# Patient Record
Sex: Male | Born: 1960 | Race: White | Hispanic: No | Marital: Married | Smoking: Current every day smoker
Health system: Southern US, Community
[De-identification: ages and names within clinical notes are randomized; demographics above are authoritative.]

## PROBLEM LIST (undated history)

## (undated) ENCOUNTER — Emergency Department (HOSPITAL_COMMUNITY): Admission: EM | Payer: Medicare Other | Source: Home / Self Care

## (undated) DIAGNOSIS — I70219 Atherosclerosis of native arteries of extremities with intermittent claudication, unspecified extremity: Secondary | ICD-10-CM

## (undated) DIAGNOSIS — J449 Chronic obstructive pulmonary disease, unspecified: Secondary | ICD-10-CM

## (undated) DIAGNOSIS — Z95828 Presence of other vascular implants and grafts: Secondary | ICD-10-CM

## (undated) DIAGNOSIS — Z5111 Encounter for antineoplastic chemotherapy: Secondary | ICD-10-CM

## (undated) DIAGNOSIS — Z95810 Presence of automatic (implantable) cardiac defibrillator: Secondary | ICD-10-CM

## (undated) DIAGNOSIS — C3491 Malignant neoplasm of unspecified part of right bronchus or lung: Secondary | ICD-10-CM

## (undated) DIAGNOSIS — E7800 Pure hypercholesterolemia, unspecified: Secondary | ICD-10-CM

## (undated) DIAGNOSIS — Z720 Tobacco use: Secondary | ICD-10-CM

## (undated) DIAGNOSIS — N3943 Post-void dribbling: Secondary | ICD-10-CM

## (undated) DIAGNOSIS — I739 Peripheral vascular disease, unspecified: Secondary | ICD-10-CM

## (undated) DIAGNOSIS — I255 Ischemic cardiomyopathy: Secondary | ICD-10-CM

## (undated) DIAGNOSIS — D126 Benign neoplasm of colon, unspecified: Secondary | ICD-10-CM

## (undated) DIAGNOSIS — F32A Depression, unspecified: Secondary | ICD-10-CM

## (undated) DIAGNOSIS — F329 Major depressive disorder, single episode, unspecified: Secondary | ICD-10-CM

## (undated) DIAGNOSIS — B182 Chronic viral hepatitis C: Secondary | ICD-10-CM

## (undated) DIAGNOSIS — I2129 ST elevation (STEMI) myocardial infarction involving other sites: Secondary | ICD-10-CM

## (undated) DIAGNOSIS — B1710 Acute hepatitis C without hepatic coma: Secondary | ICD-10-CM

## (undated) DIAGNOSIS — F419 Anxiety disorder, unspecified: Secondary | ICD-10-CM

## (undated) DIAGNOSIS — B1920 Unspecified viral hepatitis C without hepatic coma: Secondary | ICD-10-CM

## (undated) DIAGNOSIS — I2102 ST elevation (STEMI) myocardial infarction involving left anterior descending coronary artery: Secondary | ICD-10-CM

## (undated) DIAGNOSIS — I213 ST elevation (STEMI) myocardial infarction of unspecified site: Secondary | ICD-10-CM

## (undated) DIAGNOSIS — I82409 Acute embolism and thrombosis of unspecified deep veins of unspecified lower extremity: Secondary | ICD-10-CM

## (undated) HISTORY — PX: TYMPANOSTOMY TUBE PLACEMENT: SHX32

## (undated) HISTORY — DX: Peripheral vascular disease, unspecified: I73.9

## (undated) HISTORY — DX: Malignant neoplasm of unspecified part of right bronchus or lung: C34.91

## (undated) HISTORY — PX: LAPAROSCOPIC CHOLECYSTECTOMY: SUR755

## (undated) HISTORY — DX: Unspecified viral hepatitis C without hepatic coma: B19.20

## (undated) HISTORY — PX: HERNIA REPAIR: SHX51

## (undated) HISTORY — DX: ST elevation (STEMI) myocardial infarction involving left anterior descending coronary artery: I21.02

## (undated) HISTORY — DX: Ischemic cardiomyopathy: I25.5

## (undated) HISTORY — PX: FINGER SURGERY: SHX640

## (undated) HISTORY — DX: Chronic obstructive pulmonary disease, unspecified: J44.9

## (undated) HISTORY — DX: Acute embolism and thrombosis of unspecified deep veins of unspecified lower extremity: I82.409

## (undated) HISTORY — DX: ST elevation (STEMI) myocardial infarction involving other sites: I21.29

## (undated) HISTORY — DX: Benign neoplasm of colon, unspecified: D12.6

## (undated) HISTORY — DX: Depression, unspecified: F32.A

## (undated) HISTORY — DX: Encounter for antineoplastic chemotherapy: Z51.11

## (undated) HISTORY — DX: Presence of other vascular implants and grafts: Z95.828

## (undated) HISTORY — PX: TONSILLECTOMY AND ADENOIDECTOMY: SUR1326

## (undated) HISTORY — DX: Atherosclerosis of native arteries of extremities with intermittent claudication, unspecified extremity: I70.219

## (undated) HISTORY — PX: BLADDER TUMOR EXCISION: SHX238

## (undated) HISTORY — DX: Anxiety disorder, unspecified: F41.9

## (undated) HISTORY — DX: Acute hepatitis C without hepatic coma: B17.10

## (undated) HISTORY — DX: Chronic viral hepatitis C: B18.2

## (undated) HISTORY — DX: Major depressive disorder, single episode, unspecified: F32.9

## (undated) HISTORY — DX: Tobacco use: Z72.0

---

## 2003-11-26 DIAGNOSIS — M79609 Pain in unspecified limb: Secondary | ICD-10-CM | POA: Insufficient documentation

## 2003-12-06 ENCOUNTER — Emergency Department (HOSPITAL_COMMUNITY): Admission: EM | Admit: 2003-12-06 | Discharge: 2003-12-06 | Payer: Self-pay | Admitting: Emergency Medicine

## 2003-12-16 ENCOUNTER — Emergency Department (HOSPITAL_COMMUNITY): Admission: EM | Admit: 2003-12-16 | Discharge: 2003-12-16 | Payer: Self-pay | Admitting: Emergency Medicine

## 2003-12-25 ENCOUNTER — Encounter (HOSPITAL_COMMUNITY): Admission: RE | Admit: 2003-12-25 | Discharge: 2004-01-24 | Payer: Self-pay | Admitting: Orthopaedic Surgery

## 2005-03-11 ENCOUNTER — Inpatient Hospital Stay (HOSPITAL_COMMUNITY): Admission: EM | Admit: 2005-03-11 | Discharge: 2005-03-12 | Payer: Self-pay

## 2007-01-04 ENCOUNTER — Encounter (INDEPENDENT_AMBULATORY_CARE_PROVIDER_SITE_OTHER): Payer: Self-pay | Admitting: Internal Medicine

## 2007-05-03 ENCOUNTER — Encounter (INDEPENDENT_AMBULATORY_CARE_PROVIDER_SITE_OTHER): Payer: Self-pay | Admitting: Internal Medicine

## 2007-05-30 ENCOUNTER — Ambulatory Visit: Payer: Self-pay | Admitting: Internal Medicine

## 2007-05-30 DIAGNOSIS — A630 Anogenital (venereal) warts: Secondary | ICD-10-CM | POA: Insufficient documentation

## 2007-05-30 DIAGNOSIS — F411 Generalized anxiety disorder: Secondary | ICD-10-CM | POA: Insufficient documentation

## 2007-05-30 LAB — CONVERTED CEMR LAB
ALT: 92 units/L — ABNORMAL HIGH (ref 0–53)
AST: 61 units/L — ABNORMAL HIGH (ref 0–37)
Alkaline Phosphatase: 81 units/L (ref 39–117)
Basophils Absolute: 0.1 10*3/uL (ref 0.0–0.1)
Basophils Relative: 1 % (ref 0–1)
CO2: 24 meq/L (ref 19–32)
Cholesterol: 146 mg/dL (ref 0–200)
Creatinine, Ser: 1.12 mg/dL (ref 0.40–1.50)
Eosinophils Absolute: 0.1 10*3/uL (ref 0.0–0.7)
Eosinophils Relative: 1 % (ref 0–5)
HCT: 55.3 % — ABNORMAL HIGH (ref 39.0–52.0)
Hemoglobin: 18.5 g/dL — ABNORMAL HIGH (ref 13.0–17.0)
LDL Cholesterol: 79 mg/dL (ref 0–99)
MCHC: 33.5 g/dL (ref 30.0–36.0)
MCV: 96.3 fL (ref 78.0–100.0)
Monocytes Absolute: 1 10*3/uL — ABNORMAL HIGH (ref 0.2–0.7)
Platelets: 249 10*3/uL (ref 150–400)
RDW: 14.1 % — ABNORMAL HIGH (ref 11.5–14.0)
Sodium: 138 meq/L (ref 135–145)
Total Bilirubin: 1 mg/dL (ref 0.3–1.2)
Total CHOL/HDL Ratio: 2.5
Total Protein: 8.3 g/dL (ref 6.0–8.3)
VLDL: 9 mg/dL (ref 0–40)

## 2007-05-31 ENCOUNTER — Ambulatory Visit: Payer: Self-pay | Admitting: *Deleted

## 2007-06-07 ENCOUNTER — Encounter (INDEPENDENT_AMBULATORY_CARE_PROVIDER_SITE_OTHER): Payer: Self-pay | Admitting: Internal Medicine

## 2007-06-07 ENCOUNTER — Ambulatory Visit (HOSPITAL_COMMUNITY): Admission: RE | Admit: 2007-06-07 | Discharge: 2007-06-07 | Payer: Self-pay | Admitting: Internal Medicine

## 2007-06-07 ENCOUNTER — Ambulatory Visit: Payer: Self-pay | Admitting: Vascular Surgery

## 2007-06-08 ENCOUNTER — Encounter (INDEPENDENT_AMBULATORY_CARE_PROVIDER_SITE_OTHER): Payer: Self-pay | Admitting: Internal Medicine

## 2007-06-09 ENCOUNTER — Telehealth (INDEPENDENT_AMBULATORY_CARE_PROVIDER_SITE_OTHER): Payer: Self-pay | Admitting: *Deleted

## 2007-06-13 ENCOUNTER — Ambulatory Visit: Payer: Self-pay | Admitting: Internal Medicine

## 2007-06-13 DIAGNOSIS — L821 Other seborrheic keratosis: Secondary | ICD-10-CM | POA: Insufficient documentation

## 2007-06-13 DIAGNOSIS — R799 Abnormal finding of blood chemistry, unspecified: Secondary | ICD-10-CM | POA: Insufficient documentation

## 2007-06-16 LAB — CONVERTED CEMR LAB
BUN: 9 mg/dL (ref 6–23)
Hep A IgM: NEGATIVE
Hep B C IgM: NEGATIVE
Hep B S Ab: NEGATIVE
Hepatitis B Surface Ag: NEGATIVE
Potassium: 4.6 meq/L (ref 3.5–5.3)

## 2007-06-19 ENCOUNTER — Ambulatory Visit: Payer: Self-pay | Admitting: Internal Medicine

## 2007-06-19 DIAGNOSIS — B1710 Acute hepatitis C without hepatic coma: Secondary | ICD-10-CM | POA: Insufficient documentation

## 2007-06-19 HISTORY — DX: Acute hepatitis C without hepatic coma: B17.10

## 2007-06-21 ENCOUNTER — Encounter (INDEPENDENT_AMBULATORY_CARE_PROVIDER_SITE_OTHER): Payer: Self-pay | Admitting: Internal Medicine

## 2007-06-21 ENCOUNTER — Telehealth (INDEPENDENT_AMBULATORY_CARE_PROVIDER_SITE_OTHER): Payer: Self-pay | Admitting: Internal Medicine

## 2007-06-21 ENCOUNTER — Ambulatory Visit: Payer: Self-pay | Admitting: Vascular Surgery

## 2007-07-04 ENCOUNTER — Ambulatory Visit: Payer: Self-pay | Admitting: Internal Medicine

## 2007-07-10 LAB — CONVERTED CEMR LAB

## 2007-09-12 ENCOUNTER — Ambulatory Visit: Payer: Self-pay | Admitting: Vascular Surgery

## 2007-09-15 ENCOUNTER — Telehealth (INDEPENDENT_AMBULATORY_CARE_PROVIDER_SITE_OTHER): Payer: Self-pay | Admitting: Internal Medicine

## 2007-09-26 ENCOUNTER — Telehealth (INDEPENDENT_AMBULATORY_CARE_PROVIDER_SITE_OTHER): Payer: Self-pay | Admitting: Internal Medicine

## 2007-09-26 ENCOUNTER — Encounter (INDEPENDENT_AMBULATORY_CARE_PROVIDER_SITE_OTHER): Payer: Self-pay | Admitting: Internal Medicine

## 2007-09-28 DIAGNOSIS — I82409 Acute embolism and thrombosis of unspecified deep veins of unspecified lower extremity: Secondary | ICD-10-CM

## 2007-09-28 HISTORY — PX: FEMORAL-TIBIAL BYPASS GRAFT: SHX938

## 2007-09-28 HISTORY — DX: Acute embolism and thrombosis of unspecified deep veins of unspecified lower extremity: I82.409

## 2007-10-11 ENCOUNTER — Ambulatory Visit: Payer: Self-pay | Admitting: Vascular Surgery

## 2007-10-11 ENCOUNTER — Encounter (INDEPENDENT_AMBULATORY_CARE_PROVIDER_SITE_OTHER): Payer: Self-pay | Admitting: Internal Medicine

## 2007-10-18 ENCOUNTER — Encounter: Admission: RE | Admit: 2007-10-18 | Discharge: 2007-10-18 | Payer: Self-pay | Admitting: Vascular Surgery

## 2007-10-18 ENCOUNTER — Ambulatory Visit: Payer: Self-pay | Admitting: Vascular Surgery

## 2007-10-20 ENCOUNTER — Ambulatory Visit (HOSPITAL_COMMUNITY): Admission: RE | Admit: 2007-10-20 | Discharge: 2007-10-20 | Payer: Self-pay | Admitting: Vascular Surgery

## 2007-10-20 ENCOUNTER — Ambulatory Visit: Payer: Self-pay | Admitting: Vascular Surgery

## 2007-10-23 ENCOUNTER — Telehealth (INDEPENDENT_AMBULATORY_CARE_PROVIDER_SITE_OTHER): Payer: Self-pay | Admitting: Internal Medicine

## 2007-10-25 ENCOUNTER — Telehealth (INDEPENDENT_AMBULATORY_CARE_PROVIDER_SITE_OTHER): Payer: Self-pay | Admitting: Internal Medicine

## 2007-11-22 ENCOUNTER — Telehealth (INDEPENDENT_AMBULATORY_CARE_PROVIDER_SITE_OTHER): Payer: Self-pay | Admitting: Internal Medicine

## 2007-11-23 ENCOUNTER — Inpatient Hospital Stay (HOSPITAL_COMMUNITY): Admission: RE | Admit: 2007-11-23 | Discharge: 2007-12-01 | Payer: Self-pay | Admitting: Vascular Surgery

## 2007-11-23 ENCOUNTER — Encounter: Payer: Self-pay | Admitting: Vascular Surgery

## 2007-11-23 ENCOUNTER — Ambulatory Visit: Payer: Self-pay | Admitting: Vascular Surgery

## 2007-11-24 ENCOUNTER — Encounter: Payer: Self-pay | Admitting: Vascular Surgery

## 2007-11-24 HISTORY — PX: FEMORAL-TIBIAL BYPASS GRAFT: SHX938

## 2007-12-06 ENCOUNTER — Ambulatory Visit: Payer: Self-pay | Admitting: Vascular Surgery

## 2007-12-20 ENCOUNTER — Ambulatory Visit: Payer: Self-pay | Admitting: Vascular Surgery

## 2007-12-25 ENCOUNTER — Telehealth (INDEPENDENT_AMBULATORY_CARE_PROVIDER_SITE_OTHER): Payer: Self-pay | Admitting: Internal Medicine

## 2008-01-23 ENCOUNTER — Telehealth (INDEPENDENT_AMBULATORY_CARE_PROVIDER_SITE_OTHER): Payer: Self-pay | Admitting: Internal Medicine

## 2008-01-24 ENCOUNTER — Ambulatory Visit: Payer: Self-pay | Admitting: Vascular Surgery

## 2008-01-26 ENCOUNTER — Telehealth (INDEPENDENT_AMBULATORY_CARE_PROVIDER_SITE_OTHER): Payer: Self-pay | Admitting: Internal Medicine

## 2008-02-14 ENCOUNTER — Telehealth (INDEPENDENT_AMBULATORY_CARE_PROVIDER_SITE_OTHER): Payer: Self-pay | Admitting: Internal Medicine

## 2008-02-15 ENCOUNTER — Inpatient Hospital Stay (HOSPITAL_COMMUNITY): Admission: EM | Admit: 2008-02-15 | Discharge: 2008-02-20 | Payer: Self-pay | Admitting: Emergency Medicine

## 2008-02-15 ENCOUNTER — Ambulatory Visit: Payer: Self-pay | Admitting: Vascular Surgery

## 2008-02-15 ENCOUNTER — Ambulatory Visit: Payer: Self-pay | Admitting: Internal Medicine

## 2008-02-16 ENCOUNTER — Encounter: Payer: Self-pay | Admitting: Vascular Surgery

## 2008-02-20 ENCOUNTER — Encounter (INDEPENDENT_AMBULATORY_CARE_PROVIDER_SITE_OTHER): Payer: Self-pay | Admitting: Internal Medicine

## 2008-02-23 ENCOUNTER — Telehealth (INDEPENDENT_AMBULATORY_CARE_PROVIDER_SITE_OTHER): Payer: Self-pay | Admitting: Internal Medicine

## 2008-02-23 ENCOUNTER — Ambulatory Visit: Payer: Self-pay | Admitting: Internal Medicine

## 2008-02-23 LAB — CONVERTED CEMR LAB: Prothrombin Time: 18 s — ABNORMAL HIGH (ref 10.2–14.0)

## 2008-02-27 ENCOUNTER — Encounter (INDEPENDENT_AMBULATORY_CARE_PROVIDER_SITE_OTHER): Payer: Self-pay | Admitting: Internal Medicine

## 2008-02-27 ENCOUNTER — Ambulatory Visit: Payer: Self-pay | Admitting: Vascular Surgery

## 2008-02-29 ENCOUNTER — Ambulatory Visit: Payer: Self-pay | Admitting: Internal Medicine

## 2008-03-05 LAB — CONVERTED CEMR LAB
INR: 2.2 — ABNORMAL HIGH (ref 0.0–1.5)
Prothrombin Time: 25 s — ABNORMAL HIGH (ref 11.6–15.2)

## 2008-03-08 ENCOUNTER — Ambulatory Visit: Payer: Self-pay | Admitting: Vascular Surgery

## 2008-03-08 ENCOUNTER — Emergency Department (HOSPITAL_COMMUNITY): Admission: EM | Admit: 2008-03-08 | Discharge: 2008-03-08 | Payer: Self-pay | Admitting: Emergency Medicine

## 2008-03-08 ENCOUNTER — Encounter (INDEPENDENT_AMBULATORY_CARE_PROVIDER_SITE_OTHER): Payer: Self-pay | Admitting: Emergency Medicine

## 2008-03-12 ENCOUNTER — Ambulatory Visit: Payer: Self-pay | Admitting: Vascular Surgery

## 2008-03-15 ENCOUNTER — Ambulatory Visit: Payer: Self-pay | Admitting: Internal Medicine

## 2008-03-19 LAB — CONVERTED CEMR LAB: Prothrombin Time: 28.2 s — ABNORMAL HIGH (ref 11.6–15.2)

## 2008-03-25 ENCOUNTER — Telehealth (INDEPENDENT_AMBULATORY_CARE_PROVIDER_SITE_OTHER): Payer: Self-pay | Admitting: Internal Medicine

## 2008-03-27 ENCOUNTER — Ambulatory Visit: Payer: Self-pay | Admitting: Vascular Surgery

## 2008-04-05 ENCOUNTER — Emergency Department (HOSPITAL_COMMUNITY): Admission: EM | Admit: 2008-04-05 | Discharge: 2008-04-06 | Payer: Self-pay | Admitting: Emergency Medicine

## 2008-04-24 ENCOUNTER — Telehealth (INDEPENDENT_AMBULATORY_CARE_PROVIDER_SITE_OTHER): Payer: Self-pay | Admitting: Internal Medicine

## 2008-04-25 ENCOUNTER — Ambulatory Visit: Payer: Self-pay | Admitting: Vascular Surgery

## 2008-04-25 ENCOUNTER — Encounter: Payer: Self-pay | Admitting: Vascular Surgery

## 2008-04-25 ENCOUNTER — Inpatient Hospital Stay (HOSPITAL_COMMUNITY): Admission: RE | Admit: 2008-04-25 | Discharge: 2008-04-29 | Payer: Self-pay | Admitting: Vascular Surgery

## 2008-04-25 HISTORY — PX: BELOW KNEE LEG AMPUTATION: SUR23

## 2008-04-26 ENCOUNTER — Ambulatory Visit: Payer: Self-pay | Admitting: Physical Medicine & Rehabilitation

## 2008-05-23 ENCOUNTER — Ambulatory Visit: Payer: Self-pay | Admitting: Vascular Surgery

## 2008-05-27 ENCOUNTER — Telehealth (INDEPENDENT_AMBULATORY_CARE_PROVIDER_SITE_OTHER): Payer: Self-pay | Admitting: Internal Medicine

## 2008-06-14 ENCOUNTER — Encounter (INDEPENDENT_AMBULATORY_CARE_PROVIDER_SITE_OTHER): Payer: Self-pay | Admitting: Internal Medicine

## 2008-06-18 ENCOUNTER — Encounter (INDEPENDENT_AMBULATORY_CARE_PROVIDER_SITE_OTHER): Payer: Self-pay | Admitting: Internal Medicine

## 2008-08-21 ENCOUNTER — Ambulatory Visit: Payer: Self-pay | Admitting: Vascular Surgery

## 2008-10-22 ENCOUNTER — Encounter (INDEPENDENT_AMBULATORY_CARE_PROVIDER_SITE_OTHER): Payer: Self-pay | Admitting: Internal Medicine

## 2008-11-06 ENCOUNTER — Ambulatory Visit: Payer: Self-pay | Admitting: Vascular Surgery

## 2008-11-20 ENCOUNTER — Ambulatory Visit: Payer: Self-pay | Admitting: Vascular Surgery

## 2009-01-01 ENCOUNTER — Encounter: Admission: RE | Admit: 2009-01-01 | Discharge: 2009-04-01 | Payer: Self-pay | Admitting: Vascular Surgery

## 2009-01-01 ENCOUNTER — Ambulatory Visit: Payer: Self-pay | Admitting: Vascular Surgery

## 2009-04-08 ENCOUNTER — Encounter: Admission: RE | Admit: 2009-04-08 | Discharge: 2009-06-04 | Payer: Self-pay | Admitting: Vascular Surgery

## 2009-07-23 ENCOUNTER — Encounter (INDEPENDENT_AMBULATORY_CARE_PROVIDER_SITE_OTHER): Payer: Self-pay | Admitting: Internal Medicine

## 2009-07-23 ENCOUNTER — Ambulatory Visit: Payer: Self-pay | Admitting: Vascular Surgery

## 2009-07-23 ENCOUNTER — Ambulatory Visit (HOSPITAL_COMMUNITY): Admission: RE | Admit: 2009-07-23 | Discharge: 2009-07-23 | Payer: Self-pay | Admitting: Internal Medicine

## 2009-07-30 ENCOUNTER — Ambulatory Visit: Payer: Self-pay | Admitting: Vascular Surgery

## 2009-08-06 ENCOUNTER — Encounter: Admission: RE | Admit: 2009-08-06 | Discharge: 2009-08-06 | Payer: Self-pay | Admitting: Vascular Surgery

## 2009-08-06 ENCOUNTER — Ambulatory Visit: Payer: Self-pay | Admitting: Vascular Surgery

## 2009-08-08 ENCOUNTER — Ambulatory Visit (HOSPITAL_COMMUNITY): Admission: RE | Admit: 2009-08-08 | Discharge: 2009-08-08 | Payer: Self-pay | Admitting: Vascular Surgery

## 2009-08-08 HISTORY — PX: OTHER SURGICAL HISTORY: SHX169

## 2009-09-10 ENCOUNTER — Ambulatory Visit: Payer: Self-pay | Admitting: Vascular Surgery

## 2009-11-11 ENCOUNTER — Ambulatory Visit (HOSPITAL_COMMUNITY): Admission: RE | Admit: 2009-11-11 | Discharge: 2009-11-11 | Payer: Self-pay | Admitting: Internal Medicine

## 2009-11-13 ENCOUNTER — Ambulatory Visit (HOSPITAL_COMMUNITY): Admission: RE | Admit: 2009-11-13 | Discharge: 2009-11-13 | Payer: Self-pay | Admitting: Internal Medicine

## 2009-12-10 ENCOUNTER — Ambulatory Visit: Payer: Self-pay | Admitting: Vascular Surgery

## 2009-12-22 ENCOUNTER — Ambulatory Visit (HOSPITAL_COMMUNITY): Admission: RE | Admit: 2009-12-22 | Discharge: 2009-12-22 | Payer: Self-pay | Admitting: General Surgery

## 2010-03-04 ENCOUNTER — Ambulatory Visit: Payer: Self-pay | Admitting: Vascular Surgery

## 2010-09-09 ENCOUNTER — Ambulatory Visit: Payer: Self-pay | Admitting: Vascular Surgery

## 2010-09-10 ENCOUNTER — Ambulatory Visit: Payer: Self-pay | Admitting: Vascular Surgery

## 2010-10-01 ENCOUNTER — Ambulatory Visit
Admission: RE | Admit: 2010-10-01 | Discharge: 2010-10-01 | Payer: Self-pay | Source: Home / Self Care | Attending: Vascular Surgery | Admitting: Vascular Surgery

## 2010-12-20 LAB — BASIC METABOLIC PANEL
CO2: 25 mEq/L (ref 19–32)
Calcium: 9.5 mg/dL (ref 8.4–10.5)
Creatinine, Ser: 0.8 mg/dL (ref 0.4–1.5)
GFR calc Af Amer: >60 mL/min (ref >60)
Glucose, Bld: 99 mg/dL (ref 70–99)

## 2010-12-20 LAB — CBC
MCHC: 34.4 g/dL (ref 30.0–36.0)
RDW: 14.2 % (ref 11.5–15.5)

## 2010-12-30 LAB — POCT I-STAT, CHEM 8
HCT: 55 % — ABNORMAL HIGH (ref 39.0–52.0)
Hemoglobin: 18.7 g/dL — ABNORMAL HIGH (ref 13.0–17.0)
Potassium: 4 mEq/L (ref 3.5–5.1)
Sodium: 137 mEq/L (ref 135–145)

## 2010-12-30 LAB — HEMOGLOBIN AND HEMATOCRIT, BLOOD
HCT: 50.8 % (ref 39.0–52.0)
Hemoglobin: 17.9 g/dL — ABNORMAL HIGH (ref 13.0–17.0)

## 2011-02-09 NOTE — Assessment & Plan Note (Signed)
OFFICE VISIT   [name], [name]  DOB:  Nov 10, 1960                                       10/11/2007  ZOXWR#:[id number]   The patient is a 50 year old male previously seen in September of 2008  for chronic pain in his right leg and ankle.  He had decreased ABI on  that side, but had some symptoms that were not consistent with  claudication.  He presents today with persistent right leg and foot  pain.  He states that the pain has become worse than in September.  He  is able to walk only minimally without experiencing pain throughout his  right leg.  He continues to smoke 1/2 pack of cigarettes per day.  This  is decreased from 2-1/2 packs of cigarettes a day.  He denies history of  diabetes or elevated cholesterol.  He does have a history of  hypertension.  He has no history of coronary artery disease.   PAST MEDICAL HISTORY:  Remarkable for hepatitis C.   FAMILY HISTORY:  Unremarkable.   SOCIAL HISTORY:  He is married.  Has 2 children.  Smoking history is as  above.  He drinks 2 to 3 alcoholic beverages per week.  He is currently  unemployed and has had some difficulty with stress in his life recently  with falling behind on his bills.   REVIEW OF SYSTEMS:  He denies recent weight gain or loss, shortness of  breath, GI bleeding, or renal insufficiency.  He has no history of TIA,  stroke, syncopal episodes, or decreases in his visual or hearing acuity.  He has no history of variceal bleeds.  Medications include alprazolam 10  mg 4 times a day, Lyrica 50 mg twice a day, aspirin 1 daily.  He has no  known drug allergies.   PHYSICAL EXAM:  Blood pressure is 136/92, heart rate is 101 and regular.  HEENT is unremarkable.  He has 2+ carotid pulses without bruit.  Chest  is clear to auscultation.  Cardiovascular regular rate and rhythm  without murmur.  Abdomen is soft, nontender, nondistended.  The liver is  easily palpable below the right costal margin and extends  approximately  10 cm below this.  It is firm on palpation.  There is no discrete mass.  He has 2+ femoral pulses with absent popliteal and pedal pulses on the  right foot.  He has 1+ left popliteal and 2+ dorsalis pedis pulse on the  left.  He has absent posterior tibial pulse.  There are no ulcerations  on the feet.  Right foot is slightly cooler than the left.   He had repeat ABI today, which were 0.6 on the right, improved from 0.4,  and 0.9 on the left, decreased from 1 on his ABI obtained in September.   The patient has fairly debilitating claudication symptoms on the right  leg.  This has not improved.  Unfortunately, he has not been able to  quit smoking completely, but he has cut back some.  In addition, he has  an enlarged liver on physical exam with a history of hepatitis C.  As  far as his claudication symptoms are concerned, we will schedule him for  an arteriogram on January 23 with possible angioplasty and stenting.  I  discussed the risks, benefits, and possible complications for procedure  details with  the patient today, and he understands and agrees to  proceed.  As far as his liver is concerned, we will order a CT scan of  the abdomen and pelvis to further evaluate to see if he has evidence of  cirrhosis or mass in his liver.  He will also need to have a complete  metabolic panel as well as COAGS pre procedure to make sure he has no  evidence of liver dysfunction.   Janetta Hora. Fields, MD  Electronically Signed   CEF/MEDQ  D:  10/12/2007  T:  10/12/2007  Job:  [id number]

## 2011-02-09 NOTE — Assessment & Plan Note (Signed)
OFFICE VISIT   FOCH, ROSENWALD  DOB:  December 10, 1960                                       10/18/2007  ZOXWR#:[id number]   The patient is a 50 year old male whom I previously saw on January 14  for lower extremity ischemia, claudication, and early rest pain. He  presents back to the office today for followup of results of CT scan  abdomen and pelvis.  This was done for hepatomegaly on physical exam.  I  reviewed his CT scan of the abdomen and pelvis today.  This shows no  evidence of varices.  This shows no evidence of cirrhosis.  The CT scan  of the abdomen and pelvis is otherwise unremarkable.  The patient is  scheduled for aortogram and lower extremity runoff on January 23.  Procedure details have already been discussed with him regarding that.   Janetta Hora. Fields, MD  Electronically Signed   CEF/MEDQ  D:  10/20/2007  T:  10/20/2007  Job:  [id number]

## 2011-02-09 NOTE — Assessment & Plan Note (Signed)
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  He is now 1 month out from this.   On exam today the incision is healing well.  Staples were removed today.  A shrinker was also prescribed.  He was also given a renewal of  prescription for his hydrocodone with no further refills.  Blood  pressure today was 126/83, pulse 73 and regular.  I informed the patient  that we would not be giving him further narcotic pain medications for  his below knee amputation as his pain from this should be resolved.  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  He will return for followup for left leg ABIs in six months' time.   Janetta Hora. Fields, MD  Electronically Signed   CEF/MEDQ  D:  05/24/2008  T:  05/24/2008  Job:  [phone]

## 2011-02-09 NOTE — Assessment & Plan Note (Signed)
OFFICE VISIT   [name], [name]  DOB:  09/27/1961                                       01/24/2008  BMWUX#:[id number]   The patient returns for followup today with complaints of pain around  his right leg.  He states that most of the pain is behind the knee.  He  also complains of burning, numbness, tingling, and pins and needles  sensation in both legs and the feet up to the calf.  He underwent a  right femoral to anterior tibial bypass with PROPATEN end of February of  2009.  Unfortunately, he continues to smoke.  He has intermittently  required some pain medicine.   On physical exam today, blood pressure is 133/87 in the left arm, pulse  is 86 and regular.  He has a 2+ femoral pulse in the right groin.  He  also has a 2+ dorsalis pedis pulse in the right foot.  All of his  incisions are well-healed.  He has trace edema in the right leg.  This  has not significantly changed from previous.  He is still having some  difficulty walking and ambulates with a walker.  His physical therapy  has now ended.   Overall, the patient has numbness, tingling, and burning in both legs,  which is his primary complaint.  He also has some generalized aches and  pains in the right leg.  His graft appears patent.  I believe his  symptoms are primarily neuropathic related.  I have started him on  Neurontin 300 mg 3 times a day today.  He was also given 6 refills for  this.  He will follow up with our lab protocol as scheduled.   Janetta Hora. Fields, MD  Electronically Signed   CEF/MEDQ  D:  01/24/2008  T:  01/25/2008  Job:  [id number]

## 2011-02-09 NOTE — Procedures (Signed)
LOWER EXTREMITY ARTERIAL EVALUATION-SINGLE LEVEL   INDICATION:  Followup of known PVD.  Patient complains of bilateral  claudication.  Left calf pain increasing recently with claudication  occurring at approximately 12 yards.  Questionable rest pain.   HISTORY:  Diabetes:  No.  Cardiac:  No.  Hypertension:  No.  Smoking:  Yes, decreased to one-half pack per day.  Previous Surgery:  No.   RESTING SYSTOLIC PRESSURES: (ABI)                          RIGHT                LEFT  Brachial:               124                  118  Anterior tibial:        Inaudible            104  Posterior tibial:       78 (0.60)            Inaudible  Peroneal:               Inaudible            110 (0.90)  DOPPLER WAVEFORM ANALYSIS:  Anterior tibial:                             Biphasic/triphasic  Posterior tibial:       Monophasic  Peroneal:                                    Triphasic   PREVIOUS ABI'S:  Date: 06/19/07  RIGHT:  0.4  LEFT:  1.0   IMPRESSION:  1. Right ankle brachial index stable.  2. Slight decrease in left ankle brachial index; however, waveforms      are strongly biphasic and triphasic.   ___________________________________________  Janetta Hora. Fields, MD   PB/MEDQ  D:  10/12/2007  T:  10/12/2007  Job:  [id number]

## 2011-02-09 NOTE — Op Note (Signed)
[name], [name]                   ACCOUNT NO.:  [id number]   MEDICAL RECORD NO.:  [id number]          PATIENT TYPE:  INP   LOCATION:  3315                         FACILITY:  [facility]   PHYSICIAN:  Janetta Hora. Fields, MD  DATE OF BIRTH:  02/19/1961   DATE OF PROCEDURE:  11/24/2007  DATE OF DISCHARGE:                               OPERATIVE REPORT   PROCEDURE:  Right lower extremity arteriogram.   PREOPERATIVE DIAGNOSIS:  Occluded right femoral to posterior tibial  bypass graft.   POSTOPERATIVE DIAGNOSIS:  Occluded right femoral to posterior tibial  bypass graft.   ANESTHESIA:  Local with IV sedation.   INDICATIONS:  The patient is a 50 year old male who is status post right  femoral to posterior tibial bypass and right femoral endarterectomy  yesterday.  He does not have good Doppler flow in his posterior tibial  area.  He presents for examination of his vein graft for patency.   OPERATIVE DETAILS:  After obtaining informed consent, the patient was  taken to the [facility] lab.  The patient placed in supine position on the  angiography table.  Both groins were prepped and draped in the usual  sterile fashion.  Local anesthesia was infiltrated over the left common  femoral artery.  A Majestic needle was used to cannulate the left common  femoral artery and a 0.035 Wholey wire advanced into the abdominal aorta  without difficulty.  Next a 5-French sheath was placed over the  guidewire in the left femoral artery.  A 5-French crossover catheter was  then placed over the guidewire and the right common iliac artery was  selectively catheterized.  The guidewire was then advanced down to the  level of the external iliac artery.  The crossover catheter was removed  and a 5-French end-hole catheter placed over the guidewire into the  distal external iliac artery.  A right lower extremity runoff view was  obtained.  The right common femoral and profunda femoris artery are  widely patent.  The  superficial femoral artery is occluded at its  origin.  No bypass graft is visualized at the origin of the common  femoral artery.  Lower extremity runoff shows reconstitution of an  anterior tibial artery, which fills the plantar arch and the dorsalis  pedis artery.  There is some late filling of the posterior tibial  artery.  There is no distal visualization of the bypass grafts.   At this point the end-hole catheter was removed over a guidewire.  The 5-  French sheath was left in place to be pulled in the holding area.  The  patient tolerated the procedure well and there were no complications.  The patient was taken to the holding area in stable condition.   OPERATIVE FINDINGS:  1. Occluded right femoral to posterior tibial bypass graft.  2. One-vessel runoff via the anterior tibial artery with late filling      of the posterior tibial artery near the level of the ankle.      Janetta Hora. Fields, MD  Electronically Signed     CEF/MEDQ  D:  11/24/2007  T:  11/25/2007  Job:  [id number]

## 2011-02-09 NOTE — Op Note (Signed)
NAME:  KYLLE, LALL                   ACCOUNT NO.:  [id number]   MEDICAL RECORD NO.:  [id number]          PATIENT TYPE:  INP   LOCATION:  3315                         FACILITY:  [facility]   PHYSICIAN:  Juleen China IV, MDDATE OF BIRTH:  1960-11-05   DATE OF PROCEDURE:  11/25/2007  DATE OF DISCHARGE:                               OPERATIVE REPORT   PREOPERATIVE DIAGNOSIS:  Ischemic right leg, clotted bypass graft.   POSTOPERATIVE DIAGNOSIS:  Ischemic right leg, clotted bypass graft.   PROCEDURES PERFORMED:  1. Thrombectomy, right femoral to anterior tibial bypass graft.  2. Intraarterial injection of tPA.   ANESTHESIA:  General.   BLOOD LOSS:  200 mL.   FINDINGS:  Thrombus within anterior tibial artery as well as bypass  graft.   INDICATION:  This is a 50 year old gentleman who underwent a femoral to  anterior tibial artery bypass on the right earlier today by Dr. Darrick Penna.  I was notified by the nurse that the patient began complaining of  increasing pain in his foot and they were no longer able to obtain  Doppler signals.  After evaluating the patient, I felt that his bypass  graft had occluded.  I had a lengthy discussion with the patient and  plan was to proceed to the operating room for thrombectomy with possible  revision.  Risks and benefits were discussed.  Informed consent was  signed.   PROCEDURE:  The patient was identified in the holding area and taken to  room 6.  He was placed supine on the table.  General endotracheal  anesthesia was administered.  The patient's right leg was prepped and  draped in standard sterile fashion.  A time out was called.  Perioperative antibiotics were administered.  The staples within the  lateral aspect of the lower leg incision were removed.  Sutures were  removed.  Blunt dissection was used to identify the graft, which was  then traced down to the anterior tibial artery.  I placed vessel loops  proximally and distally on the anterior  tibial artery.  At this point,  the patient was given systemic heparinization.  A transverse graftotomy  was made just proximal to the anastomosis.  Thrombus was visualized  within the graft as well as the artery.  A #3 Fogarty was then passed  antegrade into the anterior tibial artery down across on to the foot.  Thrombectomy was performed and thrombus was evacuated.  After the  thrombus was evacuated with 2 passes of the Fogarty, there was good back  bleeding from the anterior tibial artery.  At this point, the artery was  flushed with heparinized saline.  I then turned our attention toward the  bypass graft.  A #4 Fogarty was passed retrograde up into the femoral  artery and thrombectomy was performed.  Significant thrombus was  evacuated and excellent inflow was obtained.  I inspected the  anastomosis at this point and felt that the anastomosis was widely  patent.  I then administered 4 mg of tPA directly into the anterior  tibial artery.  The bypass graft  was then flushed in antegrade fashion.  The graftotomy was closed with a running Gore CV-6 suture.  Clamps were  released from the bypass graft.  Doppler was used to evaluate the  anterior tibial artery and had a great signal distal to the anastomosis.  A Doppler signal was also obtained in the dorsalis pedis artery.  However, this was not as brisk as the anterior tibial artery signal just  distal to the anastomosis.  At this point, I did not see anything  further to do at this time.  The patient was placed on a heparin  drip.  Hemostasis in the wound bed was achieved with Bovie cautery.  The  fascia was left open.  The subcutaneous tissue was reapproximated with  interrupted 3-0 Vicryl and skin was closed with staples.  Sterile  dressings were applied.  The patient was successfully extubated and  taken to the recovery room in stable condition.           ______________________________  V. Charlena Cross, MD  Electronically  Signed     VWB/MEDQ  D:  11/25/2007  T:  11/25/2007  Job:  [id number]

## 2011-02-09 NOTE — Op Note (Signed)
[name], [name]                   ACCOUNT NO.:  [id number]   MEDICAL RECORD NO.:  [id number]          PATIENT TYPE:  INP   LOCATION:  3315                         FACILITY:  [facility]   PHYSICIAN:  Janetta Hora. Fields, MD  DATE OF BIRTH:  04-15-1961   DATE OF PROCEDURE:  11/24/2007  DATE OF DISCHARGE:                               OPERATIVE REPORT   PROCEDURE:  Right femoral anterior tibial bypass, with Propaten.   PREOPERATIVE DIAGNOSIS:  Ischemia of right leg.   POSTOPERATIVE DIAGNOSIS:  Ischemia of right leg.   ANESTHESIA:  General.   ASSISTANT:  Cyndy Freeze, PA   INDICATIONS:  The patient is a 50 year old male who underwent right  femoral to posterior tibial bypass yesterday.  The bypasses occluded  acutely.  The patient had a marginal outflow vessel and marginal vein.  It was decided that after arteriogram today that the anterior tibial  artery would be a better distal target, and that a prosthetic graft  would be used.   OPERATIVE DETAILS:  After obtaining informed consent, the patient was  taken to the operating room.  The patient was placed in the supine  position on the operating table.  After induction of general anesthesia  and endotracheal intubation, the patient's right lower extremity was  prepped and draped in the usual sterile fashion.  A longitudinal  incision was then made on the right leg on the lateral aspect over the  anterior compartment.  The anterior tibialis muscle was reflected  medially.  A tissue plane was dissected free down at the base of the  muscle.  The anterior tibial artery was identified and dissected free.  It was approximately 1.5 mm in diameter.  Approximately 3 cm of the  artery was exposed.   Next, the preexisting staples in the right groin wound were removed.  The right groin incision was reopened down to the level of the previous  bypass graft.  This was occluded up to the level of the origin.  This  was dissected free  circumferentially.  Vessel loops were also placed  around the common femoral artery proximal and distal to the site of the  anastomosis.   Next, a tunneler was used to tunnel laterally in the subfascial plane on  the lateral aspect of the right leg; and then up over the anterior thigh  and subsartorial into the right groin.  A 6 mm Propaten graft was  brought through this tunnel.  The patient was then given 7000 units of  intravenous heparin.  The common femoral artery was controlled proximal  and distally with vessel loops.  The proximal anastomosis of the old  vein graft was taken down.  The vein graft was ligated with 2-0 silk  distally.  There was good inflow and outflow through the common femoral  artery.  The PTFE graft was spatulated.  This was then sewn end of graft  to side of artery using a running 5-0 Prolene suture.  Just prior to  completion of the anastomosis, it was fore bled, back bled and  thoroughly flushed.  The graft was clamped right at its origin, so that  no flow went down the graft at this point.  Thrombin and Gelfoam was  packed around the anastomosis.   Attention was then turned to the distal portion of the graft.  Graft was  a ring-supported graft, and several of these rings were taken off the  bottom portion of the graft.  The anterior tibial artery was opened  longitudinally.  It was smooth on its undersurface.  It was a fairly  good quality artery, except for the fact that is was small.  The leg was  straightened to determine the length of the graft.  The graft was then  cut to length and beveled.  The graft was then sewn end of graft to side  of artery, using a running 7-0 Prolene suture.  Just prior to  completion, the anastomosis was fore bled, back bled and thoroughly  flushed.  The anastomosis was secured, clamp were released; there was a  palpable pulse in the graft distally immediately.  There was good  Doppler flow in the anterior tibial artery and  dorsalis pedis artery,  which augmented approximately 90% with opening and occlusion of the  graft.   Next, hemostasis was obtained with thrombin and Gelfoam.  The patient  was also given 50 mg of protamine.  The fascia was reapproximated over  the anterior compartment using interrupted 2-0 Vicryl sutures.  Subcutaneous tissues were closed with running 3-0 Vicryl suture.  The  skin was closed with staples.  The groin was closed in multiple layers  with running 2-0 Vicryl suture, and the skin closed with staples.  Both  wounds were thoroughly irrigated before closure.  The patient tolerated  the procedure well and there were no complications.  Instrument, sponge  and needle counts were correct at the end of the case.  The patient was  extubated in the operating room and taken to the recovery room in stable  condition.      Janetta Hora. Fields, MD  Electronically Signed     CEF/MEDQ  D:  11/24/2007  T:  11/25/2007  Job:  [id number] [id number]

## 2011-02-09 NOTE — Assessment & Plan Note (Signed)
OFFICE VISIT   ANGELUS, HOOPES  DOB:  04/20/1961                                       07/30/2009  EAVWU#:[id number]   CHIEF COMPLAINT:  Pain in left foot.   The patient is a 50 year old male who previously underwent right femoral  endarterectomy with right femoral to posterior tibial bypass in February  of 2009.  He ultimately ended up with a right below knee amputation in  July of 2009.  He now presents complaining of claudication type pain in  his left lower extremity and symptoms of rest pain in his left foot.  He  states that he has been developing a cramping sensation in his left leg  after walking approximately 30 yards.  This has been present for about 6  months.  He states also that he has pain in the left foot occasionally.  He is currently ambulating on his right below knee amputation with a  prosthetic.   His primary atherosclerotic risk factor includes tobacco use.  He is  currently smoking half pack of cigarettes per day.  I had a lengthy  discussion with him today greater than 3 minutes in length about smoking  cessation, his risk of limb loss and life loss long-term with this.   Records from Dr. Sharyon Medicus office were reviewed.  Recent ABIs from [facility] were also reviewed.  This was 0.23 on 07/23/2009.   PAST MEDICAL HISTORY:  Otherwise remarkable for anxiety and peripheral  neuropathy.   MEDICATIONS:  1. Xanax 2 mg once a day.  2. Lasix p.r.n.  3. Vicodin p.r.n.   ALLERGIES:  He has allergies listed to Neurontin and Lyrica.   PAST SURGICAL HISTORY:  Is as listed above.   FAMILY HISTORY:  Is unremarkable for vascular disease.   SOCIAL HISTORY:  He is married, has 2 children.  Smokes half pack  cigarettes per day.  He occasionally drinks alcohol.   REVIEW OF SYSTEMS:  Full review of systems was performed.  Please see  intake referral form for details.   PHYSICAL EXAM:  Vital signs:  Blood pressure is 122/85 in the left  arm,  oxygen saturation 98%, heart rate is 88 and regular.  HEENT:  Unremarkable.  Neck:  Has 2+ carotid pulses without bruit.  Chest:  Clear to auscultation.  Cardiac:  Is regular rate and rhythm without  murmur.  Abdomen:  Soft, nontender, nondistended.  No masses.  Extremities:  He has a 1+ right femoral pulse.  He has a well-healed  right below knee amputation.  He has absent left femoral pulse.  He has  absent left popliteal and pedal pulses.  The left foot is dusky in  appearance.  Skin:  Has no lesions or rashes.  Musculoskeletal:  He has  no significant joint deformities.  Neurologic:  Shows symmetric upper  extremity and lower extremity motor strength which is 5/5.   I believe at this point the patient is certainly at risk of limb loss of  his left lower extremity with an ABI of 0.2.  He also has early symptoms  of rest pain.  Fortunately he has no tissue loss at this point.  Since  his femoral pulses are not normal in quality I believe the best option  would be to obtain a CT angiogram with runoff.  Based on the  findings of  this we will determine what might be the possible revascularization  options for him.  He will return next week after his CT scan.   Janetta Hora. Fields, MD  Electronically Signed   CEF/MEDQ  D:  07/31/2009  T:  07/31/2009  Job:  [id number]   cc:   Madelin Rear. Sherwood Gambler, MD

## 2011-02-09 NOTE — Op Note (Signed)
NAME:  [name], [name]                   ACCOUNT NO.:  [id number]   MEDICAL RECORD NO.:  [id number]          PATIENT TYPE:  AMB   LOCATION:  SDS                          FACILITY:  [facility]   PHYSICIAN:  Janetta Hora. Fields, MD  DATE OF BIRTH:  09-07-1961   DATE OF PROCEDURE:  10/20/2007  DATE OF DISCHARGE:  10/20/2007                               OPERATIVE REPORT   PROCEDURE:  Aortogram with bilateral lower extremity runoff.   PREOPERATIVE DIAGNOSIS:  Rest pain, right foot.   POSTOPERATIVE DIAGNOSIS:  Rest pain, right foot.   ANESTHESIA:  Local.   OPERATION IN DETAIL:  After obtaining informed consent, the patient was  taken to the PV lab.  The patient was placed in the supine position on  the angio table.  Both groins were prepped and draped in the usual  sterile fashion.  Local anesthesia was infiltrated over the right common  femoral artery.   Next, a Majestic needle was used to cannulate the right common femoral  artery and a 0.035 J-tip guide wire threaded up into the abdominal aorta  under fluoroscopic guidance.  Next, a 5-French sheath was placed over  the guidewire in the right common femoral artery.  A 5-French pigtail  catheter was then placed over the guidewire in the abdominal aorta.  Abdominal aortogram was then obtained.  There was mild stenosis of the  right renal artery of approximately 25%.  The left renal artery was  widely patent.  The aorta, left the right common iliac arteries were  widely patent.  Left and right external and internal iliac arteries were  widely patent.  The pigtail catheter was then pulled down to the level  of the aortic bifurcation.  The left to right external iliac arteries  and common femoral arteries were widely patent.  Runoff views were also  obtained.  The right superficial femoral artery occludes at its origin.  The left superficial femoral artery is patent.  The left and right  profunda femoris arteries are patent.  There are profunda  collaterals  around the knee, but the popliteal artery is not visualized on the right  side.  The origin of the tibioperoneal trunk is not visualized.  However, the takeoff of the peroneal and posterior tibial arteries does  fill via profunda collaterals.  Both of these vessels are then patent  all the way to the level of the foot.  The left lower extremity there  has three-vessel runoff all the way to the level of the foot.  Dominant  runoff vessel on the right side is the posterior tibial artery which  fills all the way into the foot.   Next a 5 French pigtail catheter was removed over a guidewire.  The 5-  French sheath was left in place to be pulled in the holding area.  The  patient tolerated the procedure well, and there were no complications.  The patient was taken to the holding area in stable condition.   OPERATIVE FINDINGS:  1. 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  2. Runoff via the  peroneal and posterior tibial artery in the right      foot.  3. Widely patent arterial system of the left lower extremity with      three-vessel runoff.  4. 25% stenosis of right renal artery.      Janetta Hora. Fields, MD  Electronically Signed     CEF/MEDQ  D:  10/27/2007  T:  10/27/2007  Job:  [id number]

## 2011-02-09 NOTE — Assessment & Plan Note (Signed)
OFFICE VISIT   [name], [name]  DOB:  1961/04/10                                       09/10/2009  WUJWJ#:[id number]   Patient returns for follow-up today.  He recently underwent left  external iliac artery stenting to try to improve circulation in his left  lower extremity.  He was experiencing rest pain prior to the procedure.  He states that now he no longer has any rest pain in his left foot, but  he does still have fairly short distance claudication in the left lower  extremity.  He currently denies any ulcerations on the feet.  He has  continued ambulating with a right below-knee prosthesis, but he has some  problems with pressure on his right anterior tibia.   Review of systems was remarkable for recent weight gain because of his  inactivity.  He also has a history of some anxiety.   PHYSICAL EXAMINATION:  Blood pressure is 147/87 in the left arm, heart  rate is 55 and regular.  Temperature is 98.  Lower extremity exam shows  a 2+ left femoral pulse.  He has absent femoral and popliteal pulses on  the right side.  He has absent popliteal and pedal pulses on the left  side.  He has no significant edema in the left lower extremity.   He had bilateral ABIs performed today which were 0.44 on the left,  improved from 0.2.   I had a lengthy discussion with the patient today that he is certainly  at risk for limb loss long-term.  The arteriogram showed that his right  iliac artery is now occluded.  As far as the problems with pressure on  his right below-knee amputation stump, I do not believe he is a very  good candidate for revision of this, as this may not heal.  The only  option would be to consider doing an above-knee amputation, which he  currently is not in favor of.  As far as his left lower extremity is  concerned, he would potentially be a candidate for a left lower  extremity bypass procedure.  However, I prefer to delay this for awhile  if  possible, as the durability may be limited.  He currently does not  have rest pain in the left foot and currently has no ulcerations in the  left foot.  I believe the best option at this point is continued current  close follow-up.  He will return in 3 months' time for repeat ABIs or  sooner if he develops ulceration or rest pain in the left foot.     Janetta Hora. Fields, MD  Electronically Signed   CEF/MEDQ  D:  09/12/2009  T:  09/12/2009  Job:  [id number]   cc:   Madelin Rear. Sherwood Gambler, MD

## 2011-02-09 NOTE — Procedures (Signed)
BYPASS GRAFT EVALUATION   INDICATION:  Followup, right femoral-to-anterior tibial artery bypass  graft.   HISTORY:  Diabetes:  No.  Cardiac:  No.  Hypertension:  No.  Smoking:  Yes.  Previous Surgery:  Please see above.   SINGLE LEVEL ARTERIAL EXAM                               RIGHT              LEFT  Brachial:                    132                127  Anterior tibial:             0                  79  Posterior tibial:            0                  88  Peroneal:  Ankle/brachial index:        0                  0.67   PREVIOUS ABI:  Date: 12/20/07  RIGHT:  1.07  LEFT:  0.61   LOWER EXTREMITY BYPASS GRAFT DUPLEX EXAM:   DUPLEX:  Occluded right femoral to anterior tibial artery bypass graft.    IMPRESSION:  1. Occluded right femoral artery to anterior tibial bypass graft.  2. Moderately abnormal ankle brachial index with monophasic Doppler      waveform noted in the left leg.  3. No flow detected in the right anterior tibial and posterior tibial.      ___________________________________________  Di Kindle. Edilia Bo, M.D.   MG/MEDQ  D:  03/12/2008  T:  03/12/2008  Job:  [id number]

## 2011-02-09 NOTE — Letter (Signed)
October 20, 2007   To Whom It May Concern   Medical record number [id number].  Date of birth 1961-09-25.   Re:  [name], [name]             DOB:  24-Oct-1960   The patient has been under my care since September of 2008 for right  lower extremity ischemia.  He has severe peripheral arterial disease  (PAD) in his right lower extremity.  He underwent an arteriogram on  October 20, 2007, and this showed that he will need a right femoral to  posterior tibial bypass to improve perfusion to his right foot.  He is  very debilitated and can only walk minimal distances and has moderate to  severe pain on occasion in his right foot secondary to this.  We will  schedule his lower extremity bypass within the next 1-2 weeks.  He will  require 5-7 days of hospitalization at that time for his bypass  procedure.  He will also have at least 1-2 weeks of recovery time at  home before he is able to ambulate well after his operation.  Please  feel free to contact me if you need any further information regarding  his condition or care.   Janetta Hora. Fields, MD  Electronically Signed   CEF/MEDQ  D:  10/20/2007  T:  10/20/2007  Job:  [id number]

## 2011-02-09 NOTE — Assessment & Plan Note (Signed)
OFFICE VISIT   [name], [name]  DOB:  [birth year]                                       08/06/2009  ZOXWR#:[id number]   The patient returns for followup today after his CT angiogram.  He  reports no significant change in his left foot.  He still has very short  distance claudication but today did not really describe rest pain.  Unfortunately he still continues to smoke a half pack of cigarettes  today and approximately 3 minutes today were spent regarding smoking  cessation.   PHYSICAL EXAMINATION:  On physical exam blood pressure is 131/86 in the  left arm, heart rate is 84 and regular.  Temperature is 98.  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  He  states he has some occasional numbness and tingling in this but overall  is able to walk with a prosthesis without difficulty.  Left lower  extremity has absent left femoral pulse, popliteal pulse and pedal  pulses.  The foot appears less ischemic on exam today that it did on  November 3.  The foot is cool but does have some cap refill at this  point.  He has no open ulcerations on the foot.  The foot is not dusky  in appearance today.  Recent ABI was 0.2.  CT angiogram was reviewed  today.  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  He has two tandem high-grade stenosis of his left common iliac artery.  He also has occlusion of his left superficial femoral artery distally.   I believe the best option at this point for the patient would be  arteriogram of his left iliac system with hopefully angioplasty and  stenting of his left common iliac artery.  This should give him adequate  perfusion to take him out of a limb threatening situation.  We will  watch him for several weeks after this if we were able to place the  stent.  Hopefully his perfusion will increase significantly and he will  not need any interventions in addition to that.  His angiogram is  scheduled for Friday 08/08/2009.  The risks, benefits, possible  complications and procedure details were discussed with the patient  today including but not limited to contrast reaction, bleeding,  infection, vessel injury.  He understands and agrees to proceed.   Janetta Hora. Fields, MD  Electronically Signed   CEF/MEDQ  D:  08/07/2009  T:  08/07/2009  Job:  [id number]   cc:   Madelin Rear. Sherwood Gambler, MD

## 2011-02-09 NOTE — Assessment & Plan Note (Signed)
OFFICE VISIT   [name], [name]  DOB:  08-26-1961                                       03/12/2008  ZOXWR#:[id number]   I saw the patient in the office today for continued followup of his  ischemic right leg.  This patient Dr. Darrick Penna' who underwent a right  femoral endarterectomy with right femoral to posterior tibial artery  bypass graft with a vein graft on 11/23/2007.  He was taken back to the  operating room on 11/24/2007 and had a right femoral to anterior tibial  artery bypass graft with a PROPATEN graft.  This subsequently occluded  and I performed a thrombectomy on 02/15/2008 of his fem-anterior tibial  bypass graft and he was maintained on Coumadin.  Over the weekend he had  developed some pain in the right foot and Dr. Hart Rochester had him evaluated  in the office today for likely an occluded bypass graft.  This was  confirmed by Doppler.   EXAMINATION:  This is a pleasant 50 year old gentleman who appears his  stated age.  He has a normal femoral pulse on the right.  His incisions  have healed nicely.  He has no pulse in his fem-anterior tibial bypass  graft.  I was able to obtain a posterior tibial signal with the Doppler  by my exam.  Motor and sensory function are decreased.  He has no calf  tenderness.  He has rubor of the right foot.   I have had a long discussion today with him about tobacco cessation and  I think this his only chance for limb salvage.  We have also encouraged  him to try to stay as active as possible to encourage the development of  collaterals, and that he will likely do best with his foot dependent.  If his symptoms progress, I think he will likely require amputation.  Hopefully, his symptoms will gradually improve, especially if he is able  to get off of tobacco.  We will have him see Dr. Darrick Penna in 2 weeks to  follow his progress.  He knows to call sooner if he has problems.  I  have written him a prescription for 30 Tylox for  pain.   Di Kindle. Edilia Bo, M.D.  Electronically Signed   CSD/MEDQ  D:  03/12/2008  T:  03/13/2008  Job:  [id number]

## 2011-02-09 NOTE — Assessment & Plan Note (Signed)
OFFICE VISIT   PEARCE, LITTLEFIELD  DOB:  02/15/1961                                       03/27/2008  ZOXWR#:[id number]   The patient returns for followup today.  He had multiple attempts at  revascularization of his right leg and multiple thrombectomies of this.  He was last seen by Dr. Edilia Bo on June 16.  At that time he was noted  to have reoccluded his femoral to anterior tibial bypass and it was  decided that no further revascularization attempts would be made.   He presents for further followup today.  He states that he has  difficulty walking due to pain in his right leg.   On exam he has no audible flow in the anterior tibial bypass.  He does  have some posterior tibial Doppler flow.  He currently is taking  Coumadin, Xanax and Tylox.   I believe the best option for the patient is going to be consideration  for an amputation of his right leg.  He currently wishes to think about  this.  I advised him that I think it would be okay for him to stop his  Coumadin as this was mainly to maintain graft patency and since the  graft is no longer patent he would not need this further.  He will call  me if he wishes to have an amputation done in the future.  Otherwise he  was given a prescription today for Percocet #60 dispensed, no further  refills on this unless he sees me again.   Janetta Hora. Fields, MD  Electronically Signed   CEF/MEDQ  D:  03/27/2008  T:  03/28/2008  Job:  [id number]

## 2011-02-09 NOTE — H&P (Signed)
[name], KILLE                   ACCOUNT NO.:  [id number]   MEDICAL RECORD NO.:  [id number]          PATIENT TYPE:  INP   LOCATION:  5127                         FACILITY:  [facility]   PHYSICIAN:  Janetta Hora. Fields, MD  DATE OF BIRTH:  05-26-1961   DATE OF ADMISSION:  04/25/2008  DATE OF DISCHARGE:                              HISTORY & PHYSICAL   REASON FOR ADMISSION:  Right below-knee amputation.   HISTORY OF PRESENT ILLNESS:  The patient is a 50 year old male who has  undergone multiple revascularizations of his right lower extremity.  All  of these failed.  He has chronic rest pain which is debilitating for him  requiring large amounts of narcotics for pain control.  He now presents  today for elective below-knee amputation.   PAST MEDICAL HISTORY:  Tobacco abuse.  He denies history of diabetes,  hypertension,  or hypercholesterolemia.  He does have a history of  previous myocardial infarction and congestive heart failure.  He also  has a history hepatitis C.   SOCIAL HISTORY:  He is married.  He has two children.  He has four  grandchildren.  He smokes half pack of cigarettes per day.   FAMILY HISTORY:  Unremarkable.   ALLERGIES:  No known drug allergies.   MEDICATIONS:  1. Xanax 2 mg p.o. four times a day.  2. Percocet 1-2 every 4-6 hours for pain.  3. He was on Coumadin previously to keep his bypass graft open and      this was stopped on July 2.   REVIEW OF SYSTEMS:  General, cardiac, pulmonary, GI, renal, neurologic,  hematologic, and psychiatric review of systems are all negative with the  exception of anxiety.   PHYSICAL EXAMINATION:  VITAL SIGNS:  Blood pressure is 120/73, heart  rate 76 and regular, temperature is 97.2, and respirations 20.  HEENT:  Unremarkable.  He has 2+ carotid pulses without bruit.  CHEST:  Clear to auscultation.  Cardiac:  Regular rate and rhythm without murmur.  ABDOMEN:  Soft, nontender, nondistended.  No masses.  EXTREMITIES:  He has  2+ femoral pulses bilaterally.  He has absent  popliteal and pedal pulses in the right leg.  There is erythema in the  right leg extending from the midcalf down to the right forefoot  consistent with ischemia.  The right foot is cool compared to the left.  There is an area of demarcation approximately 5 cm above the ankle on  the right foot as far as temperature is concerned.   ASSESSMENT:  Patient with chronic right lower extremity ischemia  requiring chronic narcotic use for pain control.  He presents for right  below-knee amputation.  The risks, benefits, possible complications, and  procedure details were explained to the patient.  He wishes to proceed  at this point.      Janetta Hora. Fields, MD  Electronically Signed     CEF/MEDQ  D:  04/25/2008  T:  04/26/2008  Job:  [phone]

## 2011-02-09 NOTE — Assessment & Plan Note (Signed)
OFFICE VISIT   RAWLIN, REAUME  DOB:  [birth year]                                       12/20/2007  ZOXWR#:[id number]   Mr. Groleau returns for followup today after right femoral anterior tibial  bypass on February 27th.  His incisions are continuing to heal.  He does  still have some edema in the right leg.  He still has some pain in the  right leg as well.  He is walking with a walker.  Remainder of his  staples are removed today.  ABI today was 1.07 on the right and 0.61 on  the left.  Doppler signals were monophasic bilaterally.   Unfortunately, Mr. Abbs continues to smoke.  I reinforced to him today  that the durability of his bypass graft would probably be 75% at 5 years  under ideal circumstances, and much less than this if he continues to  smoke, and he would be at high risk of amputation if his bypass  occludes.  He will follow up in our graft surveillance protocol.  He  will continue to take his aspirin.  He was given a prescription today  for Percocet, #40 dispensed, he will get no further refills.   Janetta Hora. Fields, MD  Electronically Signed   CEF/MEDQ  D:  12/20/2007  T:  12/21/2007  Job:  [phone] [phone]

## 2011-02-09 NOTE — Assessment & Plan Note (Signed)
OFFICE VISIT   DAEMIEN, FRONCZAK  DOB:  1961-03-12                                       12/06/2007  EAVWU#:[id number]   Patient returns for followup today.  He underwent right femoral to  anterior tibial artery bypass on 02/27.  This was done after he occluded  a posterior tibial bypass in the early postoperative period.  This was  most likely due to a marginal vein.  He subsequently occluded the  anterior tibial bypass, and this was thrombectomized by Dr. Myra Gianotti the  following day.  Since then, he has had no difficulties.  He was not  placed on Coumadin due to concerns about compliance on the patient's  behalf.   He is now two weeks postop.  On exam today, blood pressure is 125/68 in  the left arm.  Pulse is 77 and regular.  Right lower extremity:  The  incisions are all healing well.  Staples are removed from the upper  thigh and right groin incision.  He still has a moderate amount of edema  in the right lower extremity.  He has a biphasic dorsalis pedis Doppler  signal.  There is some edema in the foot, and the pulse is not palpable  today.   The remainder of the staples in the calf were left in place to be  removed in a few more weeks.  Overall, he is doing well.   Janetta Hora. Fields, MD  Electronically Signed   CEF/MEDQ  D:  12/06/2007  T:  12/07/2007  Job:  [id number]

## 2011-02-09 NOTE — Assessment & Plan Note (Signed)
OFFICE VISIT   [name], [name]  DOB:  25-May-1961                                       03/04/2010  ZOXWR#:[id number]   [name] returns for followup today.  He previously underwent left  external iliac artery stenting.  He has also previously had a right  below-knee amputation.  He currently complains of some improper fitting  and occasional pain in his right below-knee amputation.  He states that  recently saw the prosthetist and they are trying to make some  adjustments and make his leg fit better.  He also discussed whether or  not to trim the tibia back somewhat on the right leg.  However, as  mentioned to him before, I have some concerns doing this as the  circulation to his right leg is fairly poor and this might not heal up  causing him to eventually need a right above-knee amputation.  As far as  his right below knee amputation is concerned, we are going to continue  to manage this conservatively and see if the changes in his prosthetic  make a difference.  He will follow up in 6 month's time regarding this.   As far as left leg is concerned, he does not really describe any  claudication or rest pain symptoms.  He has had no open ulcers in the  left leg.  He has been ambulating  with his prosthesis with the left leg  as his primary source of ambulation.   He denies any shortness of breath or chest pain.   On physical exam, blood pressure is 116/82 in the left arm, heart rate  80 and regular.  Temperature is 98.  Right below-knee amputation is well-  healed.  The tibial portion of the amputation is very prominent but  there is no ulceration or callusing over this.  Left foot is dusky but  overall well-perfused with brisk cap refill and warm.  There are no open  ulcers or rashes.  He ABIs today were 0.82 on the left with monophasic  wave forms.   Unfortunately [name] continues to smoke and we did discuss with him  today smoking cessation and several  minutes were spent today, greater  than 3 minutes regarding smoking cessation counseling.  He will continue  to try to work on this.  As far as below knee amputation is concerned,  will see how he does over the next 6 months. If this does not work out  for hum, we will consider revision of his amputation.  The left leg will  continue to manage conservatively.  Hopefully his left iliac stent will  have long-term durability and he will not have any further complications  in his left leg.     Janetta Hora. Fields, MD  Electronically Signed   CEF/MEDQ  D:  03/04/2010  T:  03/05/2010  Job:  [id number]   cc:   Madelin Rear. Sherwood Gambler, MD

## 2011-02-09 NOTE — Op Note (Signed)
[name], [name]                   ACCOUNT NO.:  [id number]   MEDICAL RECORD NO.:  [id number]          PATIENT TYPE:  INP   LOCATION:  3315                         FACILITY:  [facility]   PHYSICIAN:  Janetta Hora. Fields, MD  DATE OF BIRTH:  1961-06-24   DATE OF PROCEDURE:  11/23/2007  DATE OF DISCHARGE:                               OPERATIVE REPORT   PROCEDURE:  1. Right femoral endarterectomy.  2. Right femoral to posterior tibial bypass using non-reversed greater      saphenous vein.  3. Intraoperative arteriogram x1.   PREOPERATIVE DIAGNOSIS:  Rest pain right foot.   POSTOPERATIVE DIAGNOSIS:  Rest pain right foot.   ANESTHESIA:  General.   SURGEON:  Charles E. Fields, M.D.   ASSISTANT:  Cyndy Freeze, P.A.-C.  Wilmon Arms, P.A.-C.   OPERATIVE FINDINGS:  2.5 mm to 3 mm right greater saphenous vein.   OPERATIVE DETAILS:  After obtaining informed consent, the patient was  taken to the operating room.  The patient was placed in the supine  position on the operating table.  After induction of general anesthesia  with endotracheal intubation, the patient's right lower extremity was  prepped and draped in the usual sterile fashion.  A longitudinal  incision was made in the right groin over the common femoral artery.  The incision was carried down through the subcutaneous tissues down to  the level of the artery.  The common femoral artery was dissected free  circumferentially.  It was soft anteriorly.  Next, the greater saphenous  vein was dissected free in the medial portion of the incision.  It was  approximately 4 mm at the saphenofemoral junction.  It was harvested  between several skip incisions down the leg.  The vein was uniformly  approximately 3 mm in diameter throughout most of its course.  The vein  was harvested to the mid portion of the calf from the medial aspect of  the leg.  This calf incision was then deepened down through the fascia  and the soleus muscle  was taken down.  The distal tibioperoneal trunk  and posterior tibial arteries were dissected free circumferentially.  The posterior tibial artery was very small in character, approximately  0.5 mm in diameter.  Several small side branches were dissected free and  vessel loops placed around these for control.  A deep tunnel was then  created between the heads of the gastrocnemius muscle and subsartorial  up to the level of the groin.  The greater saphenous vein was then  harvested.  The saphenofemoral junction was oversewn with a 5-0 Prolene.  The distal saphenous vein was ligated with a 2-0 silk tie.  The vein was  inspected and several 7-0 Prolene sutures were placed to obtain good  hemostasis in the vein.  The vein was then placed in a non-reversed  configuration.  The patient was given 7000 units of intravenous heparin.  It should also be noted that the patient was given an additional 8000  units of heparin during the course of the case.  The common femoral  artery was controlled proximally with a Cooley clamp and distally with a  fistula clamp.  A longitudinal arteriotomy was made in the common  femoral artery.  There was a large posterior plaque obstructing  approximately 70% of the lumen of the common femoral artery.  This was  the site the femoral endarterectomy would need to be performed.  A  suitable plane was obtained and a femoral endarterectomy was performed  through the common femoral artery down to the femoral bifurcation.  There was a good end point at the profunda femoris artery.  The  superficial femoral artery was chronically occluded.  Next, an  additional segment of vein was obtained from the distal portion of the  calf.  The incision was extended in the calf approximately 3 cm.  The  distal vein was then ligated with a 2-0 silk tie and transected.  The  vein was placed in a reversed configuration and opened longitudinally.  The vein was then sewn on as a patch  angioplasty using a running 5-0  Prolene suture.  At completion of the patch angioplasty, everything was  forward bled, back bled, and  thoroughly flushed.  The anastomosis was  secured.  There was good pulsatile flow in the common femoral artery  immediately.  The common femoral artery was then controlled proximal and  distal to the patch with vessel loops.  A longitudinal opening was made  in the patch.  The larger piece of greater saphenous vein was then  placed in a non-reversed configuration and this was sewn end of vein to  side of patch using a running 5-0 Prolene suture.  Just prior to  completion of the anastomosis, flow was restored to the lower leg and  vein graft and there was pulsatile flow into the proximal portion of the  vein.  A valvulotome was then used to lyse the valves.  The vein was  fairly small and fragile in character.  One pass was made with the  valvulotome and there was good pulsatile flow to the distal end of the  vein graft.  The vein graft was then marked for orientation and brought  through the tunneler.  This was then brought down to the level of the  posterior tibial artery that had been previously dissected.  The leg was  straightened to obtain the appropriate length.  A longitudinal opening  was then made in the posterior tibial artery.  There was good back  bleeding and forward bleeding.  The vein was then spatulated and sewn  end of vein to side of artery using a running 7-0 Prolene suture.  Just  prior to completion of the anastomosis, it was forward bled, back bled,  and thoroughly flushed.  The anastomosis was secured, clamps were  released, and there was no pulsatile flow in the proximal portion of the  vein graft.  This was carefully inspected.  It was found that the vein  graft was slightly longer than anticipated and there was kinking up  behind the knee.  The vein was pulled down further and straightened.  It  was decided, at this point, the  distal anastomosis would need to be  reconstructed.  Approximately 3 cm of vein was transected from the  distal end and the vein then controlled proximally with a fine bulldog  clamp.  The vein was again spatulated, the old anastomosis taken down,  and the vein sewn end of vein to side of artery, again using a running 7-  0 Prolene suture.  Just prior to completion of the anastomosis, this was  forward bled, back bled, and thoroughly flushed.  The anastomosis was  secured, the clamps released, there was good pulsatile flow into the  vein and in the distal posterior tibial artery at this point.  Intraoperative arteriogram was then obtained with inflow occlusion.  However, the completion film had the anastomosis basically overlying the  cortex and could not be well visualized.  There did seem to be good  distal runoff via the posterior tibial artery distally.  At this point,  hemostasis was obtained.  The patient was also given 50 mg of protamine.  The foot was inspected and there were anterior tibial, posterior tibial,  and peroneal Doppler signals.  The deep layers of all incisions were  closed with running 2-0 Vicryl suture.  The superficial layers were  closed with running 3-0 Vicryl suture.  The skin of all incisions were  then closed with staples.  The patient tolerated the procedure well and  there were no complications.  Instrument, sponge and needle counts were  correct at the end of the case.  The patient was taken to the recovery  room in stable condition.      Janetta Hora. Fields, MD  Electronically Signed     CEF/MEDQ  D:  11/23/2007  T:  11/24/2007  Job:  [id number]

## 2011-02-09 NOTE — Op Note (Signed)
[name], [name]                   ACCOUNT NO.:  [id number]   MEDICAL RECORD NO.:  [id number]          PATIENT TYPE:  INP   LOCATION:  5127                         FACILITY:  [facility]   PHYSICIAN:  Janetta Hora. Fields, MD  DATE OF BIRTH:  01-14-1961   DATE OF PROCEDURE:  04/25/2008  DATE OF DISCHARGE:                               OPERATIVE REPORT   PROCEDURE:  Right below-knee amputation.   PREOPERATIVE DIAGNOSIS:  Rest pain, right foot.   POSTOPERATIVE DIAGNOSIS:  Rest pain, right foot.   ANESTHESIA:  General.   INDICATIONS:  The patient is a 50 year old male who has previously  undergone multiple failed revascularizations of his right lower  extremity.  He has chronic right lower extremity ischemia with chronic  rest pain requiring narcotics for pain control.  He presents for  elective below-knee amputation.   ASSISTANT:  Wilmon Arms, PA-C   OPERATIVE FINDINGS:  Adequately perfused muscle tissue.   OPERATIVE DETAILS:  After obtaining informed consent, the patient was  taken to the operating room.  The patient was placed in supine position  on the operating room table.  After induction of general anesthesia and  endotracheal intubation, the patient's right lower extremity was prepped  and draped in the usual sterile fashion.  Transverse incision was made  on the right leg approximately 5 fingerbreadths below the tibial  tuberosity.  Incision was carried posteriorly to the midportion of the  leg and then extended longitudinally to create a long posterior flap.  Cautery was then used to divide the fascia and muscle tissues.  Periosteum was raised approximately 2 cm above the skin edge on the  tibia.  Periosteum was also raised circumferentially on the fibula  approximately 2 cm higher than this.  Next, the bone was transected with  a saw.  The tibial bone was transected with a saw.  The fibula was  transected with a bone cutter.  The leg was then elevated and an  amputation  knife was used to complete the amputation through the muscles  posterior to the bones.  Leg was passed off the table as a specimen.  Hemostasis was obtained with cautery and several suture ligatures.  Popliteal vein was suture ligated with two 2-0 silk sutures.  After  hemostasis had been obtained, the leg was thoroughly irrigated with  normal saline solution.  The anterior portion of the tibia was beveled  smooth with a rasp.  Next, the fascial edges were reapproximated using  interrupted 2-0 Vicryl sutures.  Subcutaneous tissues were  reapproximated using running 3-0 Vicryl suture.  Skin was closed with  staples.  The patient tolerated the procedure well with no immediate  complications.  Instrument, sponge, and needle counts were correct at  the end of the case.  The patient was taken to the recovery room in  stable condition.     Janetta Hora. Fields, MD  Electronically Signed    CEF/MEDQ  D:  04/26/2008  T:  04/26/2008  Job:  [id number]

## 2011-02-09 NOTE — Assessment & Plan Note (Signed)
OFFICE VISIT   [name], [name]  DOB:  30-Jul-1961                                       08/21/2008  EAVWU#:[id number]   The patient presents today for evaluation of his right below knee  amputation.  This was undertaken at [facility] by Dr. Fabienne Bruns in July 2009.  He was seen recently by his prosthetist and was  asked to see [place] for concern regarding his wound healing.  He did have a  very large eschar on the medial aspect of his calf and had eschar that  separated this level.  His wound looks actually quite good.  The area of  separation has a small, approximately  1 cm x 2 cm shallow clean  ulceration which appears to be healing nicely.  He will continue his  local care to this as directed and will continue with the prosthetist.  He will see Dr. Darrick Penna again on an as-needed basis.   Larina Earthly, M.D.  Electronically Signed   TFE/MEDQ  D:  08/21/2008  T:  08/26/2008  Job:  [id number]

## 2011-02-09 NOTE — Assessment & Plan Note (Signed)
OFFICE VISIT   [name], [name]  DOB:  1961/05/21                                       10/01/2010  XBJYN#:[id number]   CHIEF COMPLAINT:  Leg pain.   HISTORY OF PRESENT ILLNESS:  The patient is a 50 year old male who I  have followed for peripheral arterial disease for some time.  He  presents today for further follow-up.  He currently complains of  claudication symptoms in his left leg upon walking to the mailbox in  front of his house.  He states he is severely limited by this.  He has  previously undergone a right below-knee amputation for peripheral  arterial disease.  Unfortunately, he continues to smoke a half-pack of  cigarettes per day.  Greater than 3 minutes was spent today regarding  smoking cessation counseling.  He denies any rest pain in the left foot.  He denies any nonhealing wounds in the left foot.   He has previously undergone left external iliac artery stenting.  His  primary atherosclerotic risk factor is tobacco abuse alone.  He denies  any history of hypertension, elevated cholesterol or diabetes.   MEDICATIONS:  Unchanged.  He currently takes Xanax, Lyrica and aspirin.   PHYSICAL EXAMINATION:  Blood pressure is 115/73 in the left arm, heart  rate 66 and regular.  Neck has 2+ carotid pulses without bruit.  Chest:  Clear to auscultation.  Cardiac exam is a regular rate and rhythm  without murmur.  Abdomen is soft, nontender, slightly obese.  He has a  2+ left femoral pulse.  He has absent right femoral pulse.  He has a  well-healed right below-knee amputation.  He has absent popliteal and  pedal pulses in the left foot.  The left foot is dusky in appearance but  warm with reasonable capillary refill.  Skin has no open ulcers or  rashes.   I reviewed his ABIs from September 09, 2010, which shows an ABI on the  left side of 0.62.  This is a decrease from his previous ABI on the left  side of 0.82 in June 2011.   In summary, the patient  has peripheral arterial disease in his left  lower extremity.  He currently has claudication symptoms but does not  have critical limb ischemia at this point.  I discussed with him the  possibility of an arteriogram to further evaluate his left lower  extremity.  However, he would prefer to have conservative management at  this point.  I again emphasized how paramount it was for him to quit  smoking.  He will follow up with me in 6 months' time or sooner if he  develops worsening symptoms or wishes to revisit the idea of an  arteriogram.     Janetta Hora. Fields, MD  Electronically Signed   CEF/MEDQ  D:  10/01/2010  T:  10/02/2010  Job:  [id number]   cc:   Madelin Rear. Sherwood Gambler, MD

## 2011-02-09 NOTE — Discharge Summary (Signed)
[name], [name]                   ACCOUNT NO.:  [id number]   MEDICAL RECORD NO.:  [id number]          PATIENT TYPE:  INP   LOCATION:  5127                         FACILITY:  [facility]   PHYSICIAN:  Janetta Hora. Fields, MD  DATE OF BIRTH:  06-Dec-1960   DATE OF ADMISSION:  04/25/2008  DATE OF DISCHARGE:  04/29/2008                               DISCHARGE SUMMARY   Patient of Dr. Darrick Penna.   CHIEF COMPLAINT:  1. Ischemia of right lower limb.  2. Peripheral vascular disease.  3. Dyslipidemia.   PROCEDURE PERFORMED:  Right below-knee amputation April 25, 2008, by Dr.  Darrick Penna.   COMPLICATIONS:  None.   DISCHARGE MEDICATIONS:  1. Xanax 2 mg p.o. q.i.d.  2. Oxycodone 5/325 one p.o. q.4-6 h. p.r.n. pain.  3. Neurontin 300 mg p.o. nightly.   CONDITION AT DISCHARGE:  Stable.   DISPOSITION:  He is being discharged home with home health nursing for 1-  2 visits to assist wife in change in his dressing and home health  physical therapy and occupational therapy.  He is to see Dr. Darrick Penna in  four weeks.  The office will call with the appointment.   BRIEF IDENTIFYING STATEMENT:  For complete details, please refer the  typed history and physical.  Briefly, this very pleasant 50 year old  gentleman has a longstanding history of peripheral vascular disease,  especially in his right lower extremity.  Several attempts have been  made to salvage the extremity to no avail.  He was last seen in clinic  and recommended an amputation, the right knee was recommended.  He was  given careful instructions regarding the risks and benefits of the  procedure, and after careful consideration, elected to proceed with  surgery.   HOSPITAL COURSE:  Preoperative workup was completed as an outpatient.  He was brought in through [facility] on April 25, 2008, and  underwent the aforementioned below-knee amputation.  For complete  details, please refer the typed operative report.  The surgery was  without  complications.  He was returned to the postanesthesia care unit  extubated.  Following stabilization, he was transferred to a room on a  surgical convalescent  floor.  His diet was advanced.  He received care assistance from  physical and occupational therapy for initial rehab.  He was doing well.  His stump was healing nicely.  He was desirous of discharged home on  April 29, 2008.  He was discharged home in stable condition.      Wilmon Arms, PA      Janetta Hora. Fields, MD  Electronically Signed    KEL/MEDQ  D:  04/29/2008  T:  04/30/2008  Job:  [id number]

## 2011-02-09 NOTE — Discharge Summary (Signed)
NAMEAJDIN, MACKE                   ACCOUNT NO.:  [id number]   MEDICAL RECORD NO.:  [id number]          PATIENT TYPE:  INP   LOCATION:  2021                         FACILITY:  [facility]   PHYSICIAN:  Janetta Hora. Fields, MD  DATE OF BIRTH:  Jan 29, 1961   DATE OF ADMISSION:  11/23/2007  DATE OF DISCHARGE:  12/01/2007                               DISCHARGE SUMMARY   ADMISSION DIAGNOSIS:  Peripheral vascular occlusive disease with chronic  ischemia of the right leg with rest pain.   DISCHARGE DIAGNOSES:  1. Peripheral vascular occlusive disease with chronic ischemia of the      right leg with rest pain.  2. Status post right femoral endarterectomy and right femoral to      posterior tibial artery bypass, now occluded and is now status post      right femoral to anterior tibial artery bypass.  3. History of ongoing tobacco abuse.  4. History of hepatitis C.  5. History of anxiety.  6. History of asthma.  7. Intolerance to oxycodone which causes itching.   PROCEDURES:  1. On November 23, 2007, right femoral endarterectomy, right femoral      to posterior tibial artery bypass using nonreverse greater      saphenous vein, intraoperative arteriogram x1 by Dr. Fabienne Bruns.  2. On November 24, 2007, right lower extremity arteriogram showing      occluded right femoral to posterior tibial artery bypass graft and      one-vessel runoff via the anterior tibial artery with late filling      of the posterior tibial artery near the level of the ankle.  3. On November 24, 2007, right femoral to anterior tibial artery      bypass using Propaten graft by Dr. Fabienne Bruns.  4. On November 25, 2007, thrombectomy of right femoral to anterior      tibial artery bypass graft with intra-arterial injection of total      parenteral alimentation by Dr. Myra Gianotti.   HISTORY OF PRESENT ILLNESS:  Mr. Reierson is a 50 year old, Caucasian male  who has been followed by Dr. Fabienne Bruns for peripheral  vascular  occlusive disease since September 2008.  He has had chronic lower  extremity ischemia.  He had an arteriogram done on October 20, 2007,  showing right superficial femoral artery and popliteal artery occlusion,  runoff via the prone and posterior tibial artery in the right foot,  widely patent arterial system in the left lower extremity with three-  vessel runoff, 25% stenosis of the right renal artery.  He has had  progressive claudication and now rest pain in his right foot.  Therefore, Dr. Darrick Penna recommended that he undergo a right femoral to  posterior tibial artery bypass grafting.   HOSPITAL COURSE:  Mr. Rivero was electively admitted to [facility]  on November 23, 2007.  He underwent a right femoral to posterior tibial  artery bypass on November 23, 2007.  Unfortunately, on the morning of  postop day #1, there was no posterior tibial Doppler signal.  Therefore,  an arteriogram was done which showed the graft had occluded, but had one  vessel runoff in his anterior tibial artery.  Therefore, he underwent a  right femoral to anterior tibial artery bypass.  Unfortunately, later  that evening, he was having increased pain and there was loss of  dorsalis pedis signal.  The on-call surgeon, Dr. Myra Gianotti, was contacted  to further evaluate the patient.  He was noted to have right lower  extremity cool from the mid calf down with decreased sensation and motor  function.  There was only faint posterior tibial signal at the ankle.  Therefore, it was recommended to be taken to the operating room for  emergent thrombectomy.  TPA was also administered.  Postoperatively, he  had a good Doppler signal with his dorsalis pedis pulse.  He was placed  on IV heparin.  There was discussion regarding putting him on chronic  Coumadin therapy, however, the patient admitted that he would not be  compliant with followup.  Therefore, it was felt that we would forego  this option.  However, the  plan is to keep him on IV heparin during his  hospitalization.  He has continued to have a palpable anterior tibial  and dorsalis pedis pulse on the right.  He has had postoperative ABIs on  November 27, 2007, showing greater than 1.0 on the right and 0.53 on the  left.  His left foot temperature is decreased, but there is no evidence  of cyanosis and it was felt to be his baseline.  He has good motor  sensory function.  Postoperatively, a physical therapy consult was  ordered to assist with mobility.  They recommended home health physical  therapy and rolling walker which have been written for.  He has been  making progress.  Initially, he did require PCA for pain control, but  pain is now being managed on Norco 5/325 mg.  His bowel and bladder  function appropriately.  He is tolerating a regular diet.  He has  remained hemodynamically stable with the last vital sign blood pressure  109/63, heart rate 78 in sinus rhythm.  He has been afebrile.  Oxygen  saturation was 97% on room air.  The incision appeared to be healing  well without signs of infection.  His most recent labs show a white  blood count 5.5, hemoglobin 10, hematocrit 29, platelet count 346.  His  sodium 133, potassium 3.8, chloride 97, CO2 29, blood glucose 124, BUN  2, creatinine 0.74.  At this time, we feel that if he continues to make  progress with his mobility and there are no new changes in his status,  that he will be ready for discharge home on December 01, 2007.   CONDITION ON DISCHARGE:  He currently remains in stable condition.   DISCHARGE MEDICATIONS:  1. Aspirin 81 mg p.o. daily.  2. Xanax 2 mg p.o. four times a day p.r.n.  3. Norco 5/325 mg one to two tablets p.o. q.4 h. p.r.n. pain.  4. Nicotine patch 21 mg transdermal q.24 h., which was started this      hospitalization.   DIET:  Continue a heart-healthy diet.   ACTIVITY:  Return to activities slowly.  Avoid driving or heavy lifting  for the next 3 weeks.    WOUND CARE:  May shower and clean incisions gently with soap and water.   SPECIAL INSTRUCTIONS:  He should call if he develops fever greater than  101, redness, purulent drainage or separation  from his incision sites or  increased pain.  He has home health physical therapy arranged through  [facility].   FOLLOW UP:  He will see Dr. Darrick Penna at the office in approximately 3  weeks with ABIs.  Our office will contact him regarding specific  appointment date and time.      Jerold Coombe, P.A.      Janetta Hora. Fields, MD  Electronically Signed    AWZ/MEDQ  D:  11/30/2007  T:  12/01/2007  Job:  [id number]

## 2011-02-09 NOTE — Discharge Summary (Signed)
NAMEHAIDER, [name]                   ACCOUNT NO.:  [id number]   MEDICAL RECORD NO.:  [id number]          PATIENT TYPE:  INP   LOCATION:  2004                         FACILITY:  [facility]   PHYSICIAN:  Di Kindle. Edilia Bo, M.D.DATE OF BIRTH:  [birth year]   DATE OF ADMISSION:  02/15/2008  DATE OF DISCHARGE:  02/20/2008                               DISCHARGE SUMMARY   DISCHARGE DIAGNOSES:  1. 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  2. History of anxiety.  3. History of smoking.   PROCEDURES PERFORMED:  Thrombectomy of right femoral to anterior tibial  bypass with intraoperative angiogram by Dr. Edilia Bo.   COMPLICATIONS:  None.   DISCHARGE MEDICATIONS:  1. Xanax 2 mg p.o. q.6 h.  2. Aspirin 81 mg p.o. daily.  3. Tylox 1-2 p.o. q.4 h. p.r.n. pain, #38 given.  4. Coumadin 5 mg tablets, he is to take one to one-half tablets.   We will rely upon his home health physician which is [facility] to  manage his Coumadin.   DISPOSITION:  He is being discharged home in a stable condition with his  wounds healing well.  He is given an appointment with Dr. Edilia Bo in 3  weeks for followup at which time ABIs and staples will be removed.   BRIEF IDENTIFYING STATEMENT:  For complete details, please refer the  typed history and physical.   BRIEF HISTORY:  Briefly, this 50 year old gentleman has undergone  multiple procedures to his right femoral artery.  Eventually on November 24, 2007, he underwent a right femoral to anterior tibial bypass.  He  was subsequently discharged.  Approximately 3 weeks ago, he developed  some increasing pain in his right leg.  He was seen in the emergency  room by Dr. Edilia Bo and found to have no Doppler flow in the right foot.  His foot was cold, he had diminished motor and sensory functions.  Dr.  Edilia Bo felt that was there that he had occluded his graft.  He  recommended thrombectomy.  [name] was informed of the risks and  benefits of the  procedure including the risk of limb loss or with limb  salvage.  He elected to proceed with surgery.   HOSPITAL COURSE:  Preoperative workup continued on an urgent basis.  He  was taken to the operating room where he underwent a thrombectomy of the  right femoral to anterior tibial artery bypass with intraoperative  angiogram.  For complete details, please refer the typed operative  report.  The procedure went without complications, he was returned to  the postanesthesia care extubated.  Following stabilization, he was  transferred to a bed in a surgical stepdown unit.  His diet was  advanced.  His activities were advanced.  He was started on Coumadin  for anticoagulation.  He was walking and improving with physical  therapy.  It was healing well.  When his Coumadin became therapeutic, he  was discharged home in stable condition.   CONDITION ON DISCHARGE:  Stable.      Wilmon Arms, PA  Di Kindle. Edilia Bo, M.D.  Electronically Signed    KEL/MEDQ  D:  02/21/2008  T:  02/22/2008  Job:  [id number]

## 2011-02-09 NOTE — Procedures (Signed)
LOWER EXTREMITY ARTERIAL DUPLEX   INDICATION:  Followup to a left lower extremity CIA stent.   HISTORY:  Diabetes:  No.  Cardiac:  No.  Hypertension:  No.  Smoking:  Yes.  Previous Surgery:  Right below knee amputation, left CIA stent.   SINGLE LEVEL ARTERIAL EXAM                          RIGHT                LEFT  Brachial:               133                  130  Anterior tibial:                             0  Posterior tibial:                            58  Peroneal:                     Ankle/Brachial Index:         0.44   TBIs were also absent.   LOWER EXTREMITY ARTERIAL DUPLEX EXAM   DUPLEX:  Absent waveform in the popliteal artery, biphasic and  monophasic Doppler waveforms in the proximal arteries and monophasic in  the distal arteries.  Collateral flow to the tibial arteries appears to be provided by the  gastrocnemius artery.   IMPRESSION:  1. Popliteal artery appears occluded with trickle flow.  2. Ankle brachial index suggests severe disease.         ___________________________________________  Janetta Hora. Fields, MD   CJ/MEDQ  D:  09/10/2009  T:  09/10/2009  Job:  [id number]

## 2011-02-09 NOTE — Op Note (Signed)
[name], [name]                   ACCOUNT NO.:  [id number]   MEDICAL RECORD NO.:  [id number]          PATIENT TYPE:  INP   LOCATION:  2312                         FACILITY:  [facility]   PHYSICIAN:  Di Kindle. Edilia Bo, M.D.DATE OF BIRTH:  Oct 17, 1960   DATE OF PROCEDURE:  02/15/2008  DATE OF DISCHARGE:                               OPERATIVE REPORT   PREOPERATIVE DIAGNOSIS:  Ischemic right leg with occluded right femoral  to anterior tibial artery bypass graft.   POSTOPERATIVE DIAGNOSIS:  Ischemic right leg with occluded right femoral  to anterior tibial artery bypass graft.   PROCEDURE:  Thrombectomy of right femoral to anterior tibial artery  bypass graft, also intraoperative arteriogram.   SURGEON:  Molly Maduro T. Jen Mow, Ph.D.   ASSISTANT:  Nurse.   ANESTHESIA:  General.   INDICATIONS:  This is a 50 year old gentleman who had undergone multiple  previous revascularization attempts on the right side.  He presented  with a 3-week history of right leg pain and on exam, he had no Doppler  flow in the foot with decreased motor and sensory function.  It was felt  that he had an occluded graft.  It was not clear how long the graft had  been occluded.  It was felt that his only chance for limb salvage was  attempted thrombectomy of his graft.  The procedure and potential  complications including the risk of limb loss were discussed with the  patient preoperatively.  All his questions were answered and he was  agreeable to proceed.   TECHNIQUE:  The patient was taken to the operating room and received a  general anesthetic.  The right lower extremity was prepped and draped in  usual sterile fashion.  The previous incision along the anterior lateral  compartment was opened and through a scar tissue, the graft was  dissected free and the dissection was carried down to where the  anastomosis was to the anterior tibial artery.  The transverse  graftotomy was made and the patient was  then heparinized.  A #3 and #4  Fogarty catheters were passed proximally and the graft was  thrombectomized; however, I was unable to retrieve the arterial plug and  I could not get into the native artery and common femoral artery.  Therefore, I had to make a longitudinal incision in the groin and again  through the scar tissue, the proximal graft was dissected free up to  where it was anastomosed to the common femoral artery.  The transverse  graftotomy was made and the proximal thrombectomy was then performed and  proximal anastomosis was directly inspected after the arterial plug was  retrieved and this was widely patent.  This graftotomy was closed with  running 6-0 Prolene suture.  Next, distal thrombectomy was performed  using a #3 Fogarty catheter and there was good backbleeding.  Next, this  graftotomy was closed with a 6-0 Prolene suture.  At the completion,  there was good Doppler signal with diastolic flow distal to anastomosis,  but initially I had a hard time getting anterior tibial signal distally.  I  suspect this was related to spasm, I did give some papaverine intra-  arterially through the graft and then intraoperative arteriogram was  obtained, which showed a patent anterior tibial artery.  At this point,  there was a good anterior tibial signal with the Doppler.  Hemostasis  was obtained in the wounds.  I did not reverse the heparin.  The wounds were closed with deep layer of 3-0 Vicryl and the skin closed  with staples.  Sterile dressing was applied.  The patient tolerated the  procedure well, was transferred to the recovery room in satisfactory  condition.  All needle and sponge counts were correct.      Di Kindle. Edilia Bo, M.D.  Electronically Signed     CSD/MEDQ  D:  02/15/2008  T:  02/16/2008  Job:  [id number]
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
## 2011-02-09 NOTE — H&P (Signed)
NAMEQUINT, [name]                   ACCOUNT NO.:  [id number]   MEDICAL RECORD NO.:  [id number]          PATIENT TYPE:  INP   LOCATION:  1829                         FACILITY:  [facility]   PHYSICIAN:  Di Kindle. Edilia Bo, M.D.DATE OF BIRTH:  [birth year]   DATE OF ADMISSION:  02/15/2008  DATE OF DISCHARGE:                              HISTORY & PHYSICAL   REASON FOR ADMISSION:  Ischemic right leg.   HISTORY:  This is a 50 year old gentleman who underwent a right femoral  endarterectomy and right femoral to posterior tibial artery bypass graft  with a vein graft by Dr. Darrick Penna on November 23, 2007.  This occluded in  the early postoperative period and it was felt this was likely due to  the small size of the vein.  He underwent an arteriogram which showed  the only real runoff was the anterior tibial artery and only the  posterior tibial artery reconstituted distally.  He was therefore taken  back to the operating room on November 24, 2007 and had a right femoral  to anterior tibial artery bypass graft with a PTFE graft on November 24, 2007.  This occluded within 24 hours and he had a thrombectomy of this  by Dr.  Myra Gianotti.  On his most recent follow-up visit, he was noted to  have a palpable dorsalis pedis pulse.  He had been having some chronic  problems with paresthesias in the foot.  He continued tobacco use  despite Dr. Darrick Penna' instructions and educating the patient on the  importance of tobacco cessation.  He states that after his most recent  visit, approximately 3 weeks ago, he began having increasing pain in the  right foot and ultimately his wife made him go to [facility] today  where he was evaluated by Julieanne Manson. She felt that the foot  looked ischemic and the patient was sent to the emergency department for  a vascular consultation.  The patient's symptoms have gradually  progressed over the last 3 weeks with constant pain in the foot.   PAST MEDICAL HISTORY:   Significant for tobacco abuse.  He denies any  history of diabetes, hypertension, hypercholesterolemia, history of  previous myocardial infarction or history of congestive heart failure.  History of hepatitis C.   SOCIAL HISTORY:  He is married.  He has 2 children.  He has 4  grandchildren. He smokes a half-a-pack per day of cigarettes.   FAMILY HISTORY:  There is no history of premature cardiovascular  disease.   ALLERGIES:  NO KNOWN DRUG ALLERGIES.   MEDICATIONS:  1. Xanax 2 mg p.o. q.i.d.  2. Aspirin 81 mg p.o. daily.   REVIEW OF SYSTEMS:  GENERAL:  He has had no recent weight loss, weight  gain, problem with his appetite or fever.  CARDIAC:  He has had no chest  pain, chest pressure, palpitations or arrhythmias.  PULMONARY: He has  had no recent bronchitis or wheezing. He has had some problems with  asthma.  GI: He  has had no recent change in his bowel habits and has no  history of peptic ulcer disease.  GU: He has had no dysuria or  frequency.  NEURO:  He has had no dizziness, blackouts, headaches or  seizures.  HEMATOLOGIC:  He has had no bleeding problems or clotting  disorders.  PSYCHIATRIC:  He has had problems with anxiety.   PHYSICAL EXAMINATION:  There is a pleasant 50 year old gentleman who  appears his stated age.  Blood pressure is 128/68, heart rate is 68.  HEENT: There are no carotid bruits.  NECK:  Supple, there is no cervical lymphadenopathy.  LUNGS:  Clear bilaterally to auscultation.  CARDIAC EXAM:  He has a regular rate and rhythm.  ABDOMEN:  Soft and nontender.  He has normal pitched bowel sounds.  He  has palpable femoral pulses.  I cannot palpate popliteal or pedal pulses  in the right foot.  The right foot is slightly cooler than the left.  He  has no Doppler flow in the right foot.  He has markedly decreased motor  and sensory function in the right foot.  On the left side, I cannot  palpate pulses; however, the foot appears adequately perfused.    IMPRESSION:  This patient presents with an occluded right femoral to  anterior tibial artery bypass graft.  He had multiple procedures in  February for attempted revascularization and now presents fairly quickly  with an occluded graft again.  I think that options for  revascularization are very limited.  He has poor runoff and he has had  continued tobacco use.  I have explained that without attempted  thrombectomy of the graft I think the foot will progress and he will  require more proximal amputation.  However, even with attempted  thrombectomy, there is high risk of limb loss.  If we are able to get  the graft open, consideration could be given to placing him on Coumadin.  We have discussed the procedure, potential complications and all of his  questions were answered and he is agreeable to proceed.      Di Kindle. Edilia Bo, M.D.  Electronically Signed     CSD/MEDQ  D:  02/15/2008  T:  02/15/2008  Job:  [id number]

## 2011-02-12 ENCOUNTER — Encounter: Payer: Self-pay | Admitting: Vascular Surgery
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
## 2011-02-12 NOTE — Assessment & Plan Note (Signed)
OFFICE VISIT   CALIB, WADHWA  DOB:  Feb 08, 1961                                       08/13/2009  ZOXWR#:[id number]   The patient underwent stenting of his left external iliac artery on  08/09/2009.  He tolerated the procedure without difficulty.  Hopefully  this will improve his claudication symptoms in his left lower extremity  somewhat.  Lower extremity runoff showed diffuse tibial disease and no  real good distal tibial target.  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  I again emphasized to him smoking cessation.  Hopefully his  symptoms will improve with iliac stenting alone.  If his claudication  symptoms do not improve we will consider a distal tibial bypass but this  would only be in the event of tissue loss with ulceration or gangrenous  changes.   Janetta Hora. Fields, MD  Electronically Signed   CEF/MEDQ  D:  08/13/2009  T:  08/14/2009  Job:  [id number]   cc:   Madelin Rear. Sherwood Gambler, MD
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
## 2011-04-15 ENCOUNTER — Encounter (INDEPENDENT_AMBULATORY_CARE_PROVIDER_SITE_OTHER): Payer: Medicare Other

## 2011-04-15 ENCOUNTER — Ambulatory Visit (INDEPENDENT_AMBULATORY_CARE_PROVIDER_SITE_OTHER): Payer: Medicare Other | Admitting: Vascular Surgery

## 2011-04-15 ENCOUNTER — Encounter: Payer: Self-pay | Admitting: Vascular Surgery

## 2011-04-15 VITALS — BP 107/72 | HR 60 | Temp 97.5°F | Ht 73.0 in | Wt 240.0 lb

## 2011-04-15 DIAGNOSIS — I7092 Chronic total occlusion of artery of the extremities: Secondary | ICD-10-CM

## 2011-04-15 DIAGNOSIS — I739 Peripheral vascular disease, unspecified: Secondary | ICD-10-CM

## 2011-04-15 DIAGNOSIS — L98499 Non-pressure chronic ulcer of skin of other sites with unspecified severity: Secondary | ICD-10-CM

## 2011-04-15 DIAGNOSIS — I70219 Atherosclerosis of native arteries of extremities with intermittent claudication, unspecified extremity: Secondary | ICD-10-CM

## 2011-04-15 NOTE — Progress Notes (Deleted)
Subjective:     Patient ID: [name], male   DOB: 1961/06/20, 50 y.o.   MRN: [id number]  HPI   Review of Systems     Objective:   Physical Exam     Assessment:     ***    Plan:     ***

## 2011-04-15 NOTE — Progress Notes (Signed)
Patient is a 50 year old male who followed for quite some time for peripheral arterial disease. He was last seen in January 2012. He has previously undergone a right below-knee amputation and has severe arterial occlusive disease in his left lower extremity. He has also previously undergone left external iliac artery stenting. His last arteriogram was in November of 2010 which showed occlusion of his left superficial femoral and popliteal artery with a diseased peroneal and anterior tibial artery runoff.  He returns today complaining of difficulty fitting a prosthesis on his right below-knee dictation and states that he has pain with this. He has tried several different prosthetics none of these fit well because a rub on the tibial portion of his stump.  Chronic medical problems continue to remain primarily tobacco abuse.  Review of systems: Some early rest pain left foot, respiratory: Shortness of breath with exertion, cardiac: Denies chest pain  Physical exam:  Skin: Small abrasions left pretibial region which are healing  Musculoskeletal: Well-healed right below-knee dictation with prominent distal tibial stump  Chest: Clear to auscultation  Assessment: Patient with severe PAD but with overall stable symptoms in his left leg. Poor fitting prosthesis and right below-knee dictation secondary to bony prominence  Plan: Patient return for followup in 6 months for repeat ABI. I offered him revision of his right below-knee dictation today with the possible risk of poor wound healing requiring an above-knee amputation. He is going to think about this and called me back to schedule her procedure if he wishes to have the below-knee dictation revised.

## 2011-04-15 NOTE — Progress Notes (Signed)
F/u after labs , patient reports falling and has old sores on LLE.  Pt c/o not being able to wear his Right BK prosthesis because of "Bone sticking out".

## 2011-04-16 NOTE — Procedures (Unsigned)
LOWER EXTREMITY ARTERIAL DUPLEX  INDICATION:  Claudication.  HISTORY: Diabetes:  No. Cardiac:  No. Hypertension:  No. Smoking:  Currently. Previous Surgery:  Right below knee amputation in July 2009; left external iliac artery stent 08/08/2008; left popliteal artery occlusion noted on study 09/10/2009.  SINGLE LEVEL ARTERIAL EXAM                         RIGHT                LEFT Brachial:               112                  122 Anterior tibial:        BKA                  65 Posterior tibial:       BKA                  35 Peroneal:                    Ankle/Brachial Index:   BKA   0.53  Previous ABI  09/09/2010                     BKA                  0.62.  LOWER EXTREMITY ARTERIAL DUPLEX EXAM  DUPLEX:  Chronic occlusion of the mid superficial femoral artery to popliteal artery with minimal reconstitution of flow and collateralization present.  IMPRESSION: 1. Decreased ankle brachial index with absent PPG waveforms in the     first, third through fifth digits of the left lower extremity. 2. Known chronic occlusion with minimal reconstitution of flow as     noted above. 3. Essentially unchanged since previous study on 09/10/2009.  ___________________________________________ Janetta Hora. Fields, MD  SH/MEDQ  D:  04/15/2011  T:  04/15/2011  Job:  [id number]

## 2011-04-27 ENCOUNTER — Other Ambulatory Visit: Payer: Self-pay | Admitting: Vascular Surgery

## 2011-04-27 ENCOUNTER — Ambulatory Visit (HOSPITAL_COMMUNITY)
Admission: RE | Admit: 2011-04-27 | Discharge: 2011-04-27 | Disposition: A | Discharge status: Home / Self Care | Payer: Medicare Other | Source: Ambulatory Visit | Attending: Vascular Surgery | Admitting: Vascular Surgery

## 2011-04-27 ENCOUNTER — Encounter (HOSPITAL_COMMUNITY)
Admission: RE | Admit: 2011-04-27 | Discharge: 2011-04-27 | Disposition: A | Discharge status: Home / Self Care | Payer: Medicare Other | Source: Ambulatory Visit | Attending: Vascular Surgery | Admitting: Vascular Surgery

## 2011-04-27 DIAGNOSIS — J45909 Unspecified asthma, uncomplicated: Secondary | ICD-10-CM | POA: Insufficient documentation

## 2011-04-27 DIAGNOSIS — I739 Peripheral vascular disease, unspecified: Secondary | ICD-10-CM

## 2011-04-27 DIAGNOSIS — Z01812 Encounter for preprocedural laboratory examination: Secondary | ICD-10-CM | POA: Insufficient documentation

## 2011-04-27 DIAGNOSIS — Z01810 Encounter for preprocedural cardiovascular examination: Secondary | ICD-10-CM | POA: Insufficient documentation

## 2011-04-27 DIAGNOSIS — Z01818 Encounter for other preprocedural examination: Secondary | ICD-10-CM | POA: Insufficient documentation

## 2011-04-27 DIAGNOSIS — F17200 Nicotine dependence, unspecified, uncomplicated: Secondary | ICD-10-CM | POA: Insufficient documentation

## 2011-04-27 DIAGNOSIS — R0602 Shortness of breath: Secondary | ICD-10-CM | POA: Insufficient documentation

## 2011-04-27 LAB — COMPREHENSIVE METABOLIC PANEL
Albumin: 4.1 g/dL (ref 3.5–5.2)
Alkaline Phosphatase: 99 U/L (ref 39–117)
BUN: 7 mg/dL (ref 6–23)
Potassium: 4.2 mEq/L (ref 3.5–5.1)
Sodium: 137 mEq/L (ref 135–145)
Total Protein: 7.7 g/dL (ref 6.0–8.3)

## 2011-04-27 LAB — CBC
HCT: 49.5 % (ref 39.0–52.0)
Hemoglobin: 17.1 g/dL — ABNORMAL HIGH (ref 13.0–17.0)
MCH: 32.5 pg (ref 26.0–34.0)
MCHC: 34.5 g/dL (ref 30.0–36.0)
MCV: 94.1 fL (ref 78.0–100.0)
Platelets: 270 10*3/uL (ref 150–400)
RBC: 5.26 MIL/uL (ref 4.22–5.81)
RDW: 13.5 % (ref 11.5–15.5)
WBC: 8.6 10*3/uL (ref 4.0–10.5)

## 2011-04-27 LAB — SURGICAL PCR SCREEN: MRSA, PCR: NEGATIVE

## 2011-05-05 ENCOUNTER — Inpatient Hospital Stay (HOSPITAL_COMMUNITY)
Admission: RE | Admit: 2011-05-05 | Discharge: 2011-05-07 | DRG: 476 | Disposition: A | Discharge status: Home Health | Payer: Medicare Other | Source: Ambulatory Visit | Attending: Vascular Surgery | Admitting: Vascular Surgery

## 2011-05-05 DIAGNOSIS — F17200 Nicotine dependence, unspecified, uncomplicated: Secondary | ICD-10-CM | POA: Diagnosis present

## 2011-05-05 DIAGNOSIS — T8789 Other complications of amputation stump: Secondary | ICD-10-CM

## 2011-05-05 DIAGNOSIS — I739 Peripheral vascular disease, unspecified: Secondary | ICD-10-CM | POA: Diagnosis present

## 2011-05-05 DIAGNOSIS — I70219 Atherosclerosis of native arteries of extremities with intermittent claudication, unspecified extremity: Secondary | ICD-10-CM

## 2011-05-05 DIAGNOSIS — T8489XA Other specified complication of internal orthopedic prosthetic devices, implants and grafts, initial encounter: Principal | ICD-10-CM | POA: Diagnosis present

## 2011-05-06 LAB — BASIC METABOLIC PANEL
BUN: 4 mg/dL — ABNORMAL LOW (ref 6–23)
CO2: 34 mEq/L — ABNORMAL HIGH (ref 19–32)
Calcium: 8.8 mg/dL (ref 8.4–10.5)
Creatinine, Ser: 0.71 mg/dL (ref 0.50–1.35)
GFR calc Af Amer: >60 mL/min (ref >60)

## 2011-05-06 LAB — CBC
HCT: 42.8 % (ref 39.0–52.0)
MCH: 31.5 pg (ref 26.0–34.0)
MCV: 96.2 fL (ref 78.0–100.0)
Platelets: 245 10*3/uL (ref 150–400)
RDW: 13.5 % (ref 11.5–15.5)

## 2011-05-24 NOTE — Discharge Summary (Addendum)
  [name], [name]                   ACCOUNT NO.:  [id number]  MEDICAL RECORD NO.:  [id number]  LOCATION:  2009                         FACILITY:  [facility]  PHYSICIAN:  Janetta Hora. Willistine Ferrall, MD  DATE OF BIRTH:  1960-12-06  DATE OF ADMISSION:  05/05/2011 DATE OF DISCHARGE:  05/07/2011                              DISCHARGE SUMMARY   CHIEF COMPLAINT:  Pain in the right below-knee amputation.  HISTORY OF PRESENT ILLNESS:  The patient is a 50 year old gentleman who previously undergone a right below-knee amputation.  He had had difficulty wearing a prosthetic because of the pain in entire portion of the distal tibia and he presents for revision of the amputation.  PAST MEDICAL HISTORY:  Significant for hypertension and status post right below-knee amputation.  HOSPITAL COURSE:  The patient was taken to the operating room on May 05, 2011 for revision of right below-knee amputation.  Postoperatively, the patient did well.  He has some moderate pain, so requiring IV pain medications.  However, by the second postoperative day, he was on p.o. pain meds and wounds were healing well.  He had some minimal old blood from mild aspect of the wound, which resolved, and he was discharged to home on May 07, 2011.  DISCHARGE MEDICATIONS: 1. Hydrochlorothiazide 12.5 mg daily. 2. Citalopram 20 mg daily. 3. Xanax 2 mg four times daily. 4. Enteric-coated aspirin daily. 5. Vicodin 10/500 one tablet every 4 hours as needed for pain.  FINAL DIAGNOSIS:  Painful right below-knee amputation, status post revision.  DISPOSITION:  The patient was discharged to home.  He will follow up with Dr. Darrick Penna in 4 weeks.     Della Goo, PA-C   ______________________________ Janetta Hora Charlotta Lapaglia, MD    RR/MEDQ  D:  05/17/2011  T:  05/18/2011  Job:  [id number]  Electronically Signed by Della Goo PA on 05/24/2011 10:22:33 AM Electronically Signed by Fabienne Bruns MD on 05/27/2011 06:27:37 PM

## 2011-05-27 NOTE — Op Note (Signed)
  [name], [name]                   ACCOUNT NO.:  [id number]  MEDICAL RECORD NO.:  [id number]  LOCATION:  2009                         FACILITY:  [facility]  PHYSICIAN:  Mark Hora. Aizlynn Digilio, MD  DATE OF BIRTH:  01-25-1961  DATE OF PROCEDURE:  05/05/2011 DATE OF DISCHARGE:                              OPERATIVE REPORT   PROCEDURE:  Revision of right below-knee amputation.  PREOPERATIVE DIAGNOSES:  Pain, right below-knee amputation.  POSTOPERATIVE DIAGNOSES:  Pain, right below-knee amputation.  ANESTHESIA:  General.  ASSISTANT:  Nurse.  INDICATIONS:  The patient is a 50 year old male who has previously undergone a right below-knee amputation.  He has had difficulty wearing a prosthetic because of pain on the anterior portion of the distal tibia.  He presents today for revision of the amputation.  DESCRIPTION OF PROCEDURE:  After obtaining informed consent, the patient was taken to the operating room.  The patient was placed in supine position on the operating table.  After induction of general anesthesia and endotracheal intubation, the patient's entire right lower extremity was prepped and draped in usual sterile fashion.  Next, a transverse incision was made over the tip of the preexisting tibial edge of the below-knee amputation.  Incision was carried down through the subcutaneous tissues right at the level of the bone.  The bone was dissected free circumferentially for several centimeters.  Rongeurs were then used to debride back the bone several centimeters so that the anterior surface of the tibia was smoother in character and also a few centimeters further from the skin edge.  A rasp was then moved to make the entire tibial surface smooth.  Hemostasis was obtained.  Wound was thoroughly irrigated with normal saline solution.  Fascial edges were reapproximated using running 2-0 Vicryl suture.  Subcutaneous tissues were closed with running 3-0 Vicryl suture.  Skin was closed  with interrupted 3-0 vertical mattress nylon sutures.  The patient tolerated the procedure well and there were no complications.  Instrument, sponge, and needle counts were correct at the end of the case.  The patient was taken to the recovery room in stable condition.     Mark Hora. Mark Rogalski, MD     CEF/MEDQ  D:  05/05/2011  T:  05/06/2011  Job:  [id number]  Electronically Signed by Fabienne Bruns MD on 05/27/2011 06:27:41 PM

## 2011-06-08 ENCOUNTER — Encounter: Payer: Self-pay | Admitting: Physician Assistant

## 2011-06-09 ENCOUNTER — Ambulatory Visit (INDEPENDENT_AMBULATORY_CARE_PROVIDER_SITE_OTHER): Payer: Medicare Other | Admitting: Physician Assistant

## 2011-06-09 VITALS — BP 113/78 | HR 65 | Resp 18

## 2011-06-09 DIAGNOSIS — I739 Peripheral vascular disease, unspecified: Secondary | ICD-10-CM

## 2011-06-09 NOTE — Progress Notes (Signed)
50 y/o WM who returns today for f/u for his Right BKA revision on 05/05/11.  He previously underwent a right BKA, but had difficulty wearing his prosthesis b/c of pain on the anterior portion of the distal tibia.  He is doing quite well and has no complaints.  He does state that he lost a front bottom tooth with intubation during the procedure.  Filed Vitals:   06/09/11 1342  BP: 113/78  Pulse: 65  Resp: 18    Physical Exam:  His revision incision is healed nicely.  There is no drainage or erythema.  He states that he had a blister pop up on the lateral aspect of his knee, but this has healed.   Assessment:  50 y/o WM who returns today for his 4 week f/u for Right BKA revision on 05/05/11.  Plan:  His sutures were removed without difficulty.  We will plan on seeing him back in 6 months for f/u ABIs of his left leg.  He knows to call sooner if he has any problems.  Attending MD:  Dr. Waverly Ferrari

## 2011-06-17 LAB — CBC
HCT: 52.7 — ABNORMAL HIGH
MCHC: 33.9
WBC: 8

## 2011-06-17 LAB — COMPREHENSIVE METABOLIC PANEL
ALT: 97 — ABNORMAL HIGH
Alkaline Phosphatase: 62
CO2: 28
Calcium: 10.2
GFR calc non Af Amer: >60
Glucose, Bld: 101 — ABNORMAL HIGH
Potassium: 4.5
Sodium: 137

## 2011-06-17 LAB — PROTIME-INR: Prothrombin Time: 13

## 2011-06-18 LAB — CBC
HCT: 35 — ABNORMAL LOW
HCT: 42.1
Hemoglobin: 12.1 — ABNORMAL LOW
Hemoglobin: 14.3
MCHC: 33.7
MCV: 93.2
MCV: 94.2
Platelets: 195
Platelets: 274
RBC: 4.5
RDW: 13.9
WBC: 7.6

## 2011-06-18 LAB — URINALYSIS, ROUTINE W REFLEX MICROSCOPIC
Ketones, ur: NEGATIVE
Nitrite: NEGATIVE
Specific Gravity, Urine: 1.003 — ABNORMAL LOW
pH: 7

## 2011-06-18 LAB — BASIC METABOLIC PANEL
BUN: 3 — ABNORMAL LOW
CO2: 29
Chloride: 100
GFR calc Af Amer: >60
GFR calc non Af Amer: >60
Glucose, Bld: 121 — ABNORMAL HIGH
Glucose, Bld: 125 — ABNORMAL HIGH
Potassium: 3.6
Potassium: 3.7
Sodium: 135
Sodium: 135

## 2011-06-18 LAB — APTT: aPTT: 30

## 2011-06-18 LAB — POCT I-STAT 7, (LYTES, BLD GAS, ICA,H+H)
Bicarbonate: 28.4 — ABNORMAL HIGH
O2 Saturation: 100
Patient temperature: 36.2
Potassium: 4.5
TCO2: 30
pCO2 arterial: 43.7

## 2011-06-18 LAB — COMPREHENSIVE METABOLIC PANEL
AST: 55 — ABNORMAL HIGH
Albumin: 4.2
Calcium: 9.7
Creatinine, Ser: 0.9
GFR calc Af Amer: >60

## 2011-06-18 LAB — TYPE AND SCREEN

## 2011-06-21 LAB — CBC
HCT: 29 — ABNORMAL LOW
HCT: 29.5 — ABNORMAL LOW
HCT: 30.9 — ABNORMAL LOW
HCT: 32.3 — ABNORMAL LOW
Hemoglobin: 10 — ABNORMAL LOW
Hemoglobin: 10.1 — ABNORMAL LOW
Hemoglobin: 10.1 — ABNORMAL LOW
Hemoglobin: 10.6 — ABNORMAL LOW
Hemoglobin: 11.3 — ABNORMAL LOW
MCHC: 34.3
MCHC: 34.4
MCHC: 34.4
MCV: 93.6
MCV: 93.6
MCV: 93.7
MCV: 93.9
Platelets: 202
Platelets: 346
RBC: 3.13 — ABNORMAL LOW
RBC: 3.15 — ABNORMAL LOW
RBC: 3.5 — ABNORMAL LOW
RDW: 13.9
RDW: 13.9
RDW: 13.9
WBC: 5.5
WBC: 5.8
WBC: 5.9
WBC: 8.2

## 2011-06-21 LAB — HEPARIN LEVEL (UNFRACTIONATED)
Heparin Unfractionated: 0.15 — ABNORMAL LOW
Heparin Unfractionated: 0.18 — ABNORMAL LOW
Heparin Unfractionated: 0.44
Heparin Unfractionated: <0.1 — ABNORMAL LOW

## 2011-06-21 LAB — PROTIME-INR
INR: 1
INR: 1

## 2011-06-21 LAB — BASIC METABOLIC PANEL
BUN: 2 — ABNORMAL LOW
Creatinine, Ser: 0.74
GFR calc non Af Amer: >60
Glucose, Bld: 124 — ABNORMAL HIGH

## 2011-06-21 LAB — APTT
aPTT: 33
aPTT: 53 — ABNORMAL HIGH

## 2011-06-23 LAB — POCT I-STAT 4, (NA,K, GLUC, HGB,HCT)
Glucose, Bld: 95
Hemoglobin: 18 — ABNORMAL HIGH
Potassium: 4.1

## 2011-06-23 LAB — CBC
HCT: 40.2
HCT: 40.5
HCT: 43.1
Hemoglobin: 14
Hemoglobin: 14.1
Hemoglobin: 14.4
Hemoglobin: 14.7
MCHC: 33.5
MCHC: 34.2
MCHC: 34.7
MCHC: 35
MCV: 91.6
MCV: 91.8
MCV: 92.5
MCV: 92.7
Platelets: 239
Platelets: 259
Platelets: 265
Platelets: 293
Platelets: 404 — ABNORMAL HIGH
RBC: 4.38
RBC: 4.38
RBC: 4.64
RDW: 13.8
RDW: 13.9
RDW: 14
RDW: 14
RDW: 14.1
RDW: 14.6
WBC: 10.4
WBC: 13.2 — ABNORMAL HIGH
WBC: 5.1
WBC: 5.9
WBC: 8.2

## 2011-06-23 LAB — BASIC METABOLIC PANEL
BUN: 3 — ABNORMAL LOW
BUN: 5 — ABNORMAL LOW
CO2: 35 — ABNORMAL HIGH
Calcium: 8.7
Calcium: 8.8
Chloride: 99
Chloride: 99
Creatinine, Ser: 0.81
Creatinine, Ser: 1.08
GFR calc Af Amer: >60
GFR calc Af Amer: >60
GFR calc non Af Amer: >60
GFR calc non Af Amer: >60
GFR calc non Af Amer: >60
Glucose, Bld: 87
Glucose, Bld: 99
Potassium: 3.7
Potassium: 4.1
Sodium: 139

## 2011-06-23 LAB — PROTIME-INR
INR: 1
INR: 1
INR: 1.5
INR: 1.9 — ABNORMAL HIGH
INR: 2.4 — ABNORMAL HIGH
Prothrombin Time: 13
Prothrombin Time: 13.7
Prothrombin Time: 18.3 — ABNORMAL HIGH
Prothrombin Time: 22.8 — ABNORMAL HIGH
Prothrombin Time: 27.1 — ABNORMAL HIGH

## 2011-06-23 LAB — HEPARIN LEVEL (UNFRACTIONATED)
Heparin Unfractionated: 0.23 — ABNORMAL LOW
Heparin Unfractionated: 0.28 — ABNORMAL LOW
Heparin Unfractionated: 0.3
Heparin Unfractionated: 0.34
Heparin Unfractionated: 0.36
Heparin Unfractionated: 0.78 — ABNORMAL HIGH
Heparin Unfractionated: 0.83 — ABNORMAL HIGH

## 2011-06-23 LAB — DIFFERENTIAL
Basophils Absolute: 0.1
Basophils Relative: 1
Eosinophils Absolute: 0.3
Neutrophils Relative %: 57

## 2011-06-24 LAB — POCT I-STAT, CHEM 8
BUN: 8
Calcium, Ion: 1.11 — ABNORMAL LOW
Creatinine, Ser: 1
Creatinine, Ser: 1.1
Glucose, Bld: 88
HCT: 55 — ABNORMAL HIGH
Hemoglobin: 18.7 — ABNORMAL HIGH
Potassium: 4.9
Sodium: 135
Sodium: 137
TCO2: 29

## 2011-06-24 LAB — CK TOTAL AND CKMB (NOT AT ARMC)
Relative Index: INVALID
Total CK: 48

## 2011-06-24 LAB — DIFFERENTIAL
Basophils Relative: 0
Eosinophils Absolute: 0.1
Eosinophils Relative: 1
Lymphs Abs: 2.9

## 2011-06-24 LAB — D-DIMER, QUANTITATIVE: D-Dimer, Quant: 1.51 — ABNORMAL HIGH

## 2011-06-24 LAB — CBC
HCT: 50.8
MCHC: 34.1
MCV: 89.7
Platelets: 361
WBC: 12.8 — ABNORMAL HIGH

## 2011-06-24 LAB — PROTIME-INR: Prothrombin Time: 32.7 — ABNORMAL HIGH

## 2011-06-24 LAB — POCT CARDIAC MARKERS
CKMB, poc: <1 — ABNORMAL LOW
Myoglobin, poc: 48.6

## 2011-06-25 LAB — BASIC METABOLIC PANEL
BUN: 6
CO2: 29
Chloride: 94 — ABNORMAL LOW
Creatinine, Ser: 0.89
GFR calc Af Amer: >60
Potassium: 3.5

## 2011-06-25 LAB — CBC
HCT: 43.8
MCHC: 33
MCV: 93.8
RBC: 4.67
WBC: 10.1

## 2011-06-25 LAB — POCT I-STAT 4, (NA,K, GLUC, HGB,HCT): Hemoglobin: 18 — ABNORMAL HIGH

## 2011-09-28 HISTORY — PX: HAND TENDON SURGERY: SHX663

## 2011-10-14 ENCOUNTER — Ambulatory Visit: Payer: Medicare Other | Admitting: Vascular Surgery

## 2011-12-15 ENCOUNTER — Other Ambulatory Visit: Payer: Self-pay | Admitting: *Deleted

## 2011-12-15 ENCOUNTER — Encounter: Payer: Self-pay | Admitting: Vascular Surgery

## 2011-12-15 DIAGNOSIS — I70219 Atherosclerosis of native arteries of extremities with intermittent claudication, unspecified extremity: Secondary | ICD-10-CM

## 2011-12-16 ENCOUNTER — Ambulatory Visit (INDEPENDENT_AMBULATORY_CARE_PROVIDER_SITE_OTHER): Payer: Medicare Other | Admitting: Neurosurgery

## 2011-12-16 ENCOUNTER — Ambulatory Visit (INDEPENDENT_AMBULATORY_CARE_PROVIDER_SITE_OTHER): Payer: Medicare Other | Admitting: Vascular Surgery

## 2011-12-16 ENCOUNTER — Encounter: Payer: Self-pay | Admitting: Neurosurgery

## 2011-12-16 VITALS — BP 106/76 | HR 73 | Resp 16 | Ht 73.0 in | Wt 220.0 lb

## 2011-12-16 DIAGNOSIS — I70219 Atherosclerosis of native arteries of extremities with intermittent claudication, unspecified extremity: Secondary | ICD-10-CM

## 2011-12-16 DIAGNOSIS — Z48812 Encounter for surgical aftercare following surgery on the circulatory system: Secondary | ICD-10-CM

## 2011-12-16 HISTORY — DX: Atherosclerosis of native arteries of extremities with intermittent claudication, unspecified extremity: I70.219

## 2011-12-16 NOTE — Progress Notes (Signed)
[facility] [place] HISTORY AND PHYSICAL   Referring Physician: Dr. Sherwood Gambler  History of Present Illness: This is a 51 year old male patient of Dr. Darrick Penna followed for a long history of PAD and a history of a right BKA.  The patient reports no change in his condition although he does have some left leg pain with ambulation however he blames this on the use of his prosthesis on the right. Patient also has a history of left external iliac artery stenting with his last arteriogram being in November 2010.  Past Medical History  Diagnosis Date  . Anxiety   . Hepatitis C   . Asthma   . Myocardial infarction   . CHF (congestive heart failure)   . Depression   . DVT (deep venous thrombosis) 2009    ROS: [x]  Positive   [ ]  Denies    General: [ ]  Weight loss, [ ]  Fever, [ ]  chills Neurologic: [ ]  Dizziness, [ ]  Blackouts, [ ]  Seizure [ ]  Stroke, [ ]  "Mini stroke", [ ]  Slurred speech, [ ]  Temporary blindness; [ ]  weakness in arms or legs, [ ]  Hoarseness Cardiac: [ ]  Chest pain/pressure, [ ]  Shortness of breath at rest [ ]  Shortness of breath with exertion, [ ]  Atrial fibrillation or irregular heartbeat Vascular: [ ]  Pain in legs with walking, [ ]  Pain in legs at rest, [ ]  Pain in legs at night,  [ ]  Non-healing ulcer, [ ]  Blood clot in vein/DVT,   Pulmonary: [ ]  Home oxygen, [ ]  Productive cough, [ ]  Coughing up blood, [ ]  Asthma,  [ ]  Wheezing Musculoskeletal:  [ ]  Arthritis, [ ]  Low back pain, [ ]  Joint pain Hematologic: [ ]  Easy Bruising, [ ]  Anemia; [ ]  Hepatitis Gastrointestinal: [ ]  Blood in stool, [ ]  Gastroesophageal Reflux/heartburn, [ ]  Trouble swallowing Urinary: [ ]  chronic Kidney disease, [ ]  on HD - [ ]  MWF or [ ]  TTHS, [ ]  Burning with urination, [ ]  Difficulty urinating Skin: [ ]  Rashes, [ ]  Wounds Psychological: [ ]  Anxiety, [ ]  Depression   Social History History  Substance Use Topics  . Smoking status: Current Everyday Smoker -- 0.5 packs/day  .  Smokeless tobacco: Not on file  . Alcohol Use: No     Patient quit drinking about a year ago    Family History History reviewed. No pertinent family history.  Allergies  Allergen Reactions  . Lyrica (Pregabalin)     Chest pain and heart palps.  . Neurontin (Gabapentin)     Chest pain and heart palp    Current Outpatient Prescriptions  Medication Sig Dispense Refill  . alprazolam (XANAX) 2 MG tablet Take 2 mg by mouth 3 (three) times daily as needed.       [name] aspirin 81 MG EC tablet Take 81 mg by mouth daily.        . citalopram (CELEXA) 20 MG tablet Take 20 mg by mouth daily.        . Furosemide (LASIX PO) Take by mouth as needed.        [name] HYDROcodone-acetaminophen (VICODIN) 5-500 MG per tablet Take 1 tablet by mouth as needed.          Physical Examination  Filed Vitals:   12/16/11 1524  BP: 106/76  Pulse: 73  Resp: 16    Body mass index is 29.03 kg/(m^2).  General:  WDWN in NAD Gait: Normal HEENT: WNL Eyes: Pupils equal Pulmonary: normal non-labored breathing ,  without Rales, rhonchi,  wheezing Cardiac: RRR, without  Murmurs, rubs or gallops; No carotid bruits Abdomen: soft, NT, no masses Skin: no rashes, ulcers noted Vascular Exam/Pulses: Right stump is well healed there are no open wounds or drainage, left foot exam and is very scaly however his foot is pink and he has a 1+ posterior tibial pulse and a 2+ dorsalis pedis.  Extremities without ischemic changes, no Gangrene , no cellulitis; no open wounds;  Musculoskeletal: no muscle wasting or atrophy  Neurologic: A&O X 3; Appropriate Affect ; SENSATION: normal; MOTOR FUNCTION:  moving all extremities equally. Speech is fluent/normal  Non-Invasive Vascular Imaging: Lower arterial ABIs today show an index of 116 on the right because of the BKA the PT and DP are absent. On the left his brachial index is 104 ankle PT is 0.72 ankle-deep DP 0.64 with a monophasic flow. I discussed this with Dr. Darrick Penna who is okay with  the patient's current situation and feels no further intervention is necessary at this time.  ASSESSMENT/PLAN: Patient with a history of right BKA and known left lower extremity PAD with stable ABIs, the patient will followup here in 6 months for repeat ABIs and to be seen in Dr. Darrick Penna clinic. Their questions were encouraged and answered we will see him back in 6 months.  Lauree Chandler ANP  Clinic M.D.: [facility]

## 2012-06-14 ENCOUNTER — Other Ambulatory Visit: Payer: Self-pay | Admitting: *Deleted

## 2012-06-14 DIAGNOSIS — L98499 Non-pressure chronic ulcer of skin of other sites with unspecified severity: Secondary | ICD-10-CM

## 2012-06-14 DIAGNOSIS — I739 Peripheral vascular disease, unspecified: Secondary | ICD-10-CM

## 2012-06-14 DIAGNOSIS — Z48812 Encounter for surgical aftercare following surgery on the circulatory system: Secondary | ICD-10-CM

## 2012-06-19 ENCOUNTER — Encounter: Payer: Self-pay | Admitting: Neurosurgery

## 2012-06-20 ENCOUNTER — Ambulatory Visit: Payer: Medicare Other | Admitting: Neurosurgery

## 2012-07-12 ENCOUNTER — Encounter: Payer: Self-pay | Admitting: Neurosurgery

## 2012-07-13 ENCOUNTER — Ambulatory Visit: Payer: Medicare Other | Admitting: Neurosurgery

## 2012-07-19 ENCOUNTER — Encounter: Payer: Self-pay | Admitting: Neurosurgery

## 2012-07-20 ENCOUNTER — Ambulatory Visit (INDEPENDENT_AMBULATORY_CARE_PROVIDER_SITE_OTHER): Payer: Medicare Other | Admitting: Neurosurgery

## 2012-07-20 ENCOUNTER — Encounter (INDEPENDENT_AMBULATORY_CARE_PROVIDER_SITE_OTHER): Payer: Medicare Other | Admitting: *Deleted

## 2012-07-20 ENCOUNTER — Encounter: Payer: Self-pay | Admitting: Neurosurgery

## 2012-07-20 VITALS — BP 125/79 | HR 63 | Resp 16 | Ht 72.0 in | Wt 205.0 lb

## 2012-07-20 DIAGNOSIS — Z48812 Encounter for surgical aftercare following surgery on the circulatory system: Secondary | ICD-10-CM

## 2012-07-20 DIAGNOSIS — I739 Peripheral vascular disease, unspecified: Secondary | ICD-10-CM

## 2012-07-20 DIAGNOSIS — L98499 Non-pressure chronic ulcer of skin of other sites with unspecified severity: Secondary | ICD-10-CM

## 2012-07-20 DIAGNOSIS — M79609 Pain in unspecified limb: Secondary | ICD-10-CM

## 2012-07-20 HISTORY — DX: Peripheral vascular disease, unspecified: I73.9

## 2012-07-20 NOTE — Progress Notes (Signed)
[facility] [place] [facility]/P[facility] Office Note  CC: PAD surveillance Referring Physician: Fields  History of Present Illness: 51 year old male patient of Dr. Darrick Penna status post right below the knee amputation 2009, he is also had left EIA stenting in 2009 as well as left popliteal artery occlusion and 2010. The patient states he does have some left foot pain that is relieved by hanging his foot office out of bed however the patient also states it is not bad enough that he will undergoing intervention at this time and will not consider any diagnostics further than this duplex today.  Past Medical History  Diagnosis Date  . Anxiety   . Hepatitis C   . Asthma   . Myocardial infarction   . CHF (congestive heart failure)   . Depression   . DVT (deep venous thrombosis) 2009    ROS: [x]  Positive   [ ]  Denies    General: [ ]  Weight loss, [ ]  Fever, [ ]  chills Neurologic: [ ]  Dizziness, [ ]  Blackouts, [ ]  Seizure [ ]  Stroke, [ ]  "Mini stroke", [ ]  Slurred speech, [ ]  Temporary blindness; [ ]  weakness in arms or legs, [ ]  Hoarseness Cardiac: [ ]  Chest pain/pressure, [ ]  Shortness of breath at rest [ ]  Shortness of breath with exertion, [ ]  Atrial fibrillation or irregular heartbeat Vascular: [ ]  Pain in legs with walking, [ ]  Pain in legs at rest, [ ]  Pain in legs at night,  [ ]  Non-healing ulcer, [ ]  Blood clot in vein/DVT,   Pulmonary: [ ]  Home oxygen, [ ]  Productive cough, [ ]  Coughing up blood, [ ]  Asthma,  [ ]  Wheezing Musculoskeletal:  [ ]  Arthritis, [ ]  Low back pain, [ ]  Joint pain Hematologic: [ ]  Easy Bruising, [ ]  Anemia; [ ]  Hepatitis Gastrointestinal: [ ]  Blood in stool, [ ]  Gastroesophageal Reflux/heartburn, [ ]  Trouble swallowing Urinary: [ ]  chronic Kidney disease, [ ]  on HD - [ ]  MWF or [ ]  TTHS, [ ]  Burning with urination, [ ]  Difficulty urinating Skin: [ ]  Rashes, [ ]  Wounds Psychological: [ ]  Anxiety, [ ]  Depression   Social History History  Substance  Use Topics  . Smoking status: Current Every Day Smoker -- 0.5 packs/day  . Smokeless tobacco: Not on file  . Alcohol Use: No     Patient quit drinking about a year ago    Family History History reviewed. No pertinent family history.  Allergies  Allergen Reactions  . Lyrica (Pregabalin) Palpitations    Chest pain and heart palps.  . Neurontin (Gabapentin) Palpitations    Chest pain and heart palp    Current Outpatient Prescriptions  Medication Sig Dispense Refill  . alprazolam (XANAX) 2 MG tablet Take 2 mg by mouth 3 (three) times daily as needed.       [name] aspirin 81 MG EC tablet Take 81 mg by mouth daily.        . citalopram (CELEXA) 20 MG tablet Take 20 mg by mouth daily.        . hydrochlorothiazide (MICROZIDE) 12.5 MG capsule as needed.      [name] HYDROcodone-acetaminophen (VICODIN) 5-500 MG per tablet Take 1 tablet by mouth as needed.        . Tamsulosin HCl (FLOMAX) 0.4 MG CAPS       . Furosemide (LASIX PO) Take by mouth as needed.          Physical Examination  Filed Vitals:   07/20/12  1347  BP: 125/79  Pulse: 63  Resp: 16    Body mass index is 27.80 kg/(m^2).  General:  WDWN in NAD Gait: Normal HEENT: WNL Eyes: Pupils equal Pulmonary: normal non-labored breathing , without Rales, rhonchi,  wheezing Cardiac: RRR, without  Murmurs, rubs or gallops; No carotid bruits Abdomen: soft, NT, no masses Skin: no rashes, ulcers noted Vascular Exam/Pulses: Lower extremity pulses are not palpable  Extremities without ischemic changes, no Gangrene , no cellulitis; no open wounds;  Musculoskeletal: no muscle wasting or atrophy  Neurologic: A&O X 3; Appropriate Affect ; SENSATION: normal; MOTOR FUNCTION:  moving all extremities equally. Speech is fluent/normal  Non-Invasive Vascular Imaging: ABI today is 0.64 on the left which is slightly diminished from previous exam in March 2013  ASSESSMENT/PLAN: This is a patient with worsening rest pain in the left lower extremity  however he declines any further diagnostics or treatment at this time has requested a six-month followup. The patient return in 6 months for repeat ABIs. The patient's questions were encouraged and answered he agreed and initiated this plan.  Lauree Chandler ANP  Clinic M.D.: [facility]

## 2012-07-20 NOTE — Addendum Note (Signed)
Addended by: Sharee Pimple on: 07/20/2012 03:26 PM   Modules accepted: Orders

## 2013-01-24 ENCOUNTER — Encounter: Payer: Self-pay | Admitting: Vascular Surgery

## 2013-01-25 ENCOUNTER — Ambulatory Visit: Payer: Medicare Other | Admitting: Vascular Surgery

## 2013-01-25 ENCOUNTER — Encounter: Payer: Self-pay | Admitting: Vascular Surgery

## 2013-01-25 ENCOUNTER — Ambulatory Visit (INDEPENDENT_AMBULATORY_CARE_PROVIDER_SITE_OTHER): Payer: Medicare Other | Admitting: Vascular Surgery

## 2013-01-25 ENCOUNTER — Encounter (INDEPENDENT_AMBULATORY_CARE_PROVIDER_SITE_OTHER): Payer: Medicare Other | Admitting: *Deleted

## 2013-01-25 VITALS — BP 115/83 | HR 89 | Resp 16 | Ht 72.0 in | Wt 225.0 lb

## 2013-01-25 DIAGNOSIS — Z48812 Encounter for surgical aftercare following surgery on the circulatory system: Secondary | ICD-10-CM

## 2013-01-25 DIAGNOSIS — I739 Peripheral vascular disease, unspecified: Secondary | ICD-10-CM

## 2013-01-25 DIAGNOSIS — M79609 Pain in unspecified limb: Secondary | ICD-10-CM

## 2013-01-25 HISTORY — DX: Peripheral vascular disease, unspecified: I73.9

## 2013-01-25 NOTE — Addendum Note (Signed)
Addended by: Adria Dill L on: 01/25/2013 03:25 PM   Modules accepted: Orders

## 2013-01-25 NOTE — Progress Notes (Signed)
Patient is a 52 year old male who returns for followup today. He previously had a right leg bypass and ultimately ended with a right below-knee amputation. He has also had a left external iliac artery stent. He has a known left popliteal artery occlusion with marginal flow to the left lower extremity. He continues to deny any rest pain in the left foot. He does not really describe claudication per se but does not ambulate very long distances. He is starting to ambulate some in her right leg prosthetic. Unfortunately he continues to smoke. He is trying to quit with electronic cigarettes. Greater than 3 minutes they spent regarding smoking cessation counseling.  Review of systems: Shortness of breath with exertion. Denies chest pain.  Physical exam:  Filed Vitals:   01/25/13 1244  BP: 115/83  Pulse: 89  Resp: 16  Height: 6' (1.829 m)  Weight: 225 lb (102.059 kg)  SpO2: 98%   Extremities: 2+ right femoral pulse 1-2+ left femoral pulse absent popliteal and pedal pulses left leg Neck: No carotid bruits  Chest: Clear to auscultation bilaterally Cardiac: Regular rate and rhythm without murmur  Data: The patient had ABIs performed today. I reviewed and interpreted this study. Left ABI was 0.64 with a toe pressure of 48 which is similar to his previous findings dating back as far as 5 years.  Assessment: Stable left lower extremity peripheral arterial disease Plan: Followup in July 2014 with repeat ABIs. Quit smoking  Fabienne Bruns, MD [facility] [place] Office: [phone] Pager: [phone]

## 2013-04-05 ENCOUNTER — Ambulatory Visit: Payer: Medicare Other | Admitting: Vascular Surgery

## 2013-04-25 ENCOUNTER — Encounter: Payer: Self-pay | Admitting: Vascular Surgery

## 2013-04-26 ENCOUNTER — Encounter (INDEPENDENT_AMBULATORY_CARE_PROVIDER_SITE_OTHER): Payer: Medicare Other | Admitting: *Deleted

## 2013-04-26 ENCOUNTER — Ambulatory Visit (INDEPENDENT_AMBULATORY_CARE_PROVIDER_SITE_OTHER): Payer: Medicare Other | Admitting: Vascular Surgery

## 2013-04-26 ENCOUNTER — Encounter: Payer: Self-pay | Admitting: Vascular Surgery

## 2013-04-26 VITALS — BP 108/66 | HR 70 | Resp 16 | Ht 72.0 in | Wt 225.0 lb

## 2013-04-26 DIAGNOSIS — Z48812 Encounter for surgical aftercare following surgery on the circulatory system: Secondary | ICD-10-CM

## 2013-04-26 DIAGNOSIS — I739 Peripheral vascular disease, unspecified: Secondary | ICD-10-CM

## 2013-04-26 DIAGNOSIS — M79609 Pain in unspecified limb: Secondary | ICD-10-CM

## 2013-04-26 NOTE — Progress Notes (Signed)
Patient is a 52 year old male who returns for followup today. He previously had a right leg bypass and ultimately ended with a right below-knee amputation. He has also had a left external iliac artery stent. He has a known left popliteal artery occlusion with marginal flow to the left lower extremity. He continues to deny any rest pain in the left foot. He does not really describe claudication per se but does not ambulate very long distances. He is starting to ambulate some in her right leg prosthetic. Unfortunately he continues to smoke. He is trying to quit with electronic cigarettes. He is going to decrease his dose prior to his next visit.  Greater than 3 minutes they spent regarding smoking cessation counseling. He has developed a callous on the left first toe tip. He is getting Vicodin for pain in the left foot from Dr. Sherwood Gambler.  He however is not interested in any intervention in his left foot currently.  Review of systems: Shortness of breath with exertion. Denies chest pain.  Physical exam:    Filed Vitals:     01/25/13 1244   BP:  115/83   Pulse:  89   Resp:  16   Height:  6' (1.829 m)   Weight:  225 lb (102.059 kg)   SpO2:  98%    Extremities: 2+ right femoral pulse 1-2+ left femoral pulse absent popliteal and pedal pulses left leg Neck: No carotid bruits  Chest: Clear to auscultation bilaterally Cardiac: Regular rate and rhythm without murmur  Data: The patient had ABIs performed today. I reviewed and interpreted this study. Left ABI 0.69 stable over the last 5 years Assessment: Stable left lower extremity peripheral arterial disease Plan: Followup in July 2014 with repeat ABIs. Quit smoking  Fabienne Bruns, MD [facility] [place] Office: [phone] Pager: [phone]

## 2013-05-30 ENCOUNTER — Encounter (INDEPENDENT_AMBULATORY_CARE_PROVIDER_SITE_OTHER): Payer: Self-pay | Admitting: *Deleted

## 2013-06-19 ENCOUNTER — Ambulatory Visit: Payer: Medicare Other | Admitting: Urology

## 2013-10-03 ENCOUNTER — Other Ambulatory Visit: Payer: Self-pay | Admitting: Vascular Surgery

## 2013-10-03 DIAGNOSIS — I739 Peripheral vascular disease, unspecified: Secondary | ICD-10-CM

## 2013-11-01 ENCOUNTER — Encounter (HOSPITAL_COMMUNITY): Payer: Medicare Other

## 2013-11-01 ENCOUNTER — Ambulatory Visit: Payer: Medicare Other | Admitting: Vascular Surgery

## 2013-11-07 ENCOUNTER — Encounter: Payer: Self-pay | Admitting: Vascular Surgery

## 2013-11-08 ENCOUNTER — Encounter: Payer: Self-pay | Admitting: Vascular Surgery

## 2013-11-08 ENCOUNTER — Ambulatory Visit (INDEPENDENT_AMBULATORY_CARE_PROVIDER_SITE_OTHER): Payer: Medicare Other | Admitting: Vascular Surgery

## 2013-11-08 ENCOUNTER — Ambulatory Visit (HOSPITAL_COMMUNITY)
Admission: RE | Admit: 2013-11-08 | Discharge: 2013-11-08 | Disposition: A | Discharge status: Home / Self Care | Payer: Medicare Other | Source: Ambulatory Visit | Attending: Vascular Surgery | Admitting: Vascular Surgery

## 2013-11-08 VITALS — BP 121/79 | HR 74 | Ht 72.0 in | Wt 204.3 lb

## 2013-11-08 DIAGNOSIS — F17200 Nicotine dependence, unspecified, uncomplicated: Secondary | ICD-10-CM | POA: Insufficient documentation

## 2013-11-08 DIAGNOSIS — I739 Peripheral vascular disease, unspecified: Secondary | ICD-10-CM

## 2013-11-08 DIAGNOSIS — S88119A Complete traumatic amputation at level between knee and ankle, unspecified lower leg, initial encounter: Secondary | ICD-10-CM | POA: Insufficient documentation

## 2013-11-08 DIAGNOSIS — I70209 Unspecified atherosclerosis of native arteries of extremities, unspecified extremity: Secondary | ICD-10-CM | POA: Insufficient documentation

## 2013-11-08 DIAGNOSIS — Z48812 Encounter for surgical aftercare following surgery on the circulatory system: Secondary | ICD-10-CM

## 2013-11-08 DIAGNOSIS — Z9889 Other specified postprocedural states: Secondary | ICD-10-CM | POA: Insufficient documentation

## 2013-11-08 DIAGNOSIS — Z09 Encounter for follow-up examination after completed treatment for conditions other than malignant neoplasm: Secondary | ICD-10-CM | POA: Insufficient documentation

## 2013-11-08 NOTE — Progress Notes (Signed)
Patient is a 53 year old male who returns for followup today. He previously had a right leg bypass and ultimately ended with a right below-knee amputation. He has also had a left external iliac artery stent. He has a known left popliteal artery occlusion with marginal flow to the left lower extremity. He continues to deny any rest pain in the left foot. He does not really describe claudication per se but does not ambulate very long distances. He is starting to ambulate some in his right leg prosthetic. Unfortunately he continues to smoke. He is trying to quit.  Greater than 3 minutes they spent regarding smoking cessation counseling. He has developed a callus on the left first toe tip. He is getting Vicodin for pain in the left foot from Dr. Gerarda Fraction.  He however is not interested in any intervention in his left foot currently.  Review of systems: Shortness of breath with exertion. Denies chest pain.  Physical exam:    Filed Vitals:   11/08/13 1351  BP: 121/79  Pulse: 74  Height: 6' (1.829 m)  Weight: 204 lb 4.8 oz (92.67 kg)  SpO2: 97%     Extremities: 2+ right femoral pulse 1-2+ left femoral pulse absent popliteal and pedal pulses left leg Neck: No carotid bruits  Chest: Clear to auscultation bilaterally Cardiac: Regular rate and rhythm without murmur  Data: The patient had ABIs performed today. I reviewed and interpreted this study. Left ABI 0.71 stable over the last 5 years Assessment: Stable left lower extremity peripheral arterial disease Plan: Followup in 6 months with repeat ABIs and arterial duplex. Quit smoking  Ruta Hinds, MD [facility] [place] Office: [phone] Pager: [phone]

## 2013-11-09 NOTE — Addendum Note (Signed)
Addended by: Dorthula Rue L on: 11/09/2013 03:41 PM   Modules accepted: Orders

## 2014-03-20 ENCOUNTER — Telehealth: Payer: Self-pay | Admitting: *Deleted

## 2014-03-20 NOTE — Telephone Encounter (Signed)
Pt's wife called to schedule pt a colonoscopy, pt's wife said if the nurse cannot call back today to please call tomorrow after 10 am. Please advise [phone]

## 2014-03-28 NOTE — Telephone Encounter (Signed)
Pt is scheduled for OV with Laban Emperor, NP on 05/01/2014 at 11:00 Am for constipation.

## 2014-05-01 ENCOUNTER — Ambulatory Visit (INDEPENDENT_AMBULATORY_CARE_PROVIDER_SITE_OTHER): Payer: Medicare Other | Admitting: Gastroenterology

## 2014-05-01 ENCOUNTER — Encounter (INDEPENDENT_AMBULATORY_CARE_PROVIDER_SITE_OTHER): Payer: Self-pay

## 2014-05-01 ENCOUNTER — Other Ambulatory Visit: Payer: Self-pay | Admitting: Internal Medicine

## 2014-05-01 ENCOUNTER — Encounter: Payer: Self-pay | Admitting: Gastroenterology

## 2014-05-01 VITALS — BP 113/73 | HR 65 | Temp 97.6°F | Ht 72.0 in | Wt 202.4 lb

## 2014-05-01 DIAGNOSIS — B182 Chronic viral hepatitis C: Secondary | ICD-10-CM

## 2014-05-01 DIAGNOSIS — Z1211 Encounter for screening for malignant neoplasm of colon: Secondary | ICD-10-CM

## 2014-05-01 LAB — COMPREHENSIVE METABOLIC PANEL
ALK PHOS: 68 U/L (ref 39–117)
ALT: 46 U/L (ref 0–53)
AST: 36 U/L (ref 0–37)
Albumin: 4.4 g/dL (ref 3.5–5.2)
BILIRUBIN TOTAL: 0.5 mg/dL (ref 0.2–1.2)
BUN: 12 mg/dL (ref 6–23)
CO2: 27 mEq/L (ref 19–32)
Calcium: 9.6 mg/dL (ref 8.4–10.5)
Chloride: 104 mEq/L (ref 96–112)
Creat: 0.98 mg/dL (ref 0.50–1.35)
Glucose, Bld: 89 mg/dL (ref 70–99)
Potassium: 5.4 mEq/L — ABNORMAL HIGH (ref 3.5–5.3)
SODIUM: 143 meq/L (ref 135–145)
TOTAL PROTEIN: 7.1 g/dL (ref 6.0–8.3)

## 2014-05-01 LAB — CBC
HCT: 46.7 % (ref 39.0–52.0)
Hemoglobin: 16.3 g/dL (ref 13.0–17.0)
MCH: 30.9 pg (ref 26.0–34.0)
MCHC: 34.9 g/dL (ref 30.0–36.0)
MCV: 88.4 fL (ref 78.0–100.0)
PLATELETS: 258 10*3/uL (ref 150–400)
RBC: 5.28 MIL/uL (ref 4.22–5.81)
RDW: 15.1 % (ref 11.5–15.5)
WBC: 9.7 10*3/uL (ref 4.0–10.5)

## 2014-05-01 MED ORDER — PEG 3350-KCL-NA BICARB-NACL 420 G PO SOLR: 4000.0000 mL | ORAL | Status: DC

## 2014-05-01 NOTE — Progress Notes (Signed)
Primary Care Physician:  Glo Herring., MD Primary Gastroenterologist:  Dr. Gala Romney   Chief Complaint  Patient presents with  . Colonoscopy    never had a tcs   . Constipation    HPI:   [name] presents today to discuss initial screening colonoscopy. Occasional low-volume hematochezia. No changes in bowel habits. Doesn't like strange toilets; will not go to a bathroom unless at home. On narcotics, with occasional constipation. Drinks coffee and fine. Hydrocodone at least twice a day. Rare reflux symptoms. No dysphagia. No unexplained weight loss or lack of appetite.   Past Medical History  Diagnosis Date  . Anxiety   . Hepatitis C     no prior treatment  . Asthma   . Myocardial infarction     possible?   . Depression   . DVT (deep venous thrombosis) 2009    Past Surgical History  Procedure Laterality Date  . Femoral-tibial bypass graft  2009    Right side using non-reversed GSV   By Dr. Oneida Alar  . Femoral-tibial bypass graft  11/24/2007    Right femoral to anterior tibial BPG   by Dr. Oneida Alar  . 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 Aortogram  08/08/2009    for LLE claudication     By Dr. Oneida Alar  . Cholecystectomy    . Bladder tumor excision  8/812  . Hand tendon surgery Left 2013    Left 5th finger    Current Outpatient Prescriptions  Medication Sig Dispense Refill  . alprazolam (XANAX) 2 MG tablet Take 2 mg by mouth 4 (four) times daily as needed.       [name] aspirin 81 MG EC tablet Take 81 mg by mouth daily.        . citalopram (CELEXA) 20 MG tablet Take 40 mg by mouth daily.       [name] HYDROcodone-acetaminophen (NORCO) 10-325 MG per tablet Take by mouth as needed.       No current facility-administered medications for this visit.    Allergies as of 05/01/2014 - Review Complete 05/01/2014  Allergen Reaction Noted  . Lyrica [pregabalin] Palpitations 02/12/2011  . Neurontin [gabapentin] Palpitations 02/12/2011    Family History  Problem  Relation Age of Onset  . Colon cancer Neg Hx     History   Social History  . Marital Status: Married    Spouse Name: N/A    Number of Children: N/A  . Years of Education: N/A   Occupational History  . Not on file.   Social History Main Topics  . Smoking status: Current Every Day Smoker -- 1.50 packs/day for 20 years    Types: Cigarettes  . Smokeless tobacco: Never Used     Comment: cigarettes  . Alcohol Use: No     Comment: Patient quit drinking about a year ago; history of heavy ETOH use  . Drug Use: No     Comment: marijuana  . Sexual Activity: Not on file   Other Topics Concern  . Not on file   Social History Narrative  . No narrative on file    Review of Systems: As mentioned in HPI.    Physical Exam: BP 113/73  Pulse 65  Temp(Src) 97.6 F (36.4 C) (Oral)  Ht 6' (1.829 m)  Wt 202 lb 6.4 oz (91.808 kg)  BMI 27.44 kg/m2 General:   Alert and oriented. Pleasant and cooperative. Well-nourished and well-developed.  Head:  Normocephalic and atraumatic. Eyes:  Without icterus, sclera clear and conjunctiva pink.  Ears:  Normal auditory acuity. Nose:  No deformity, discharge,  or lesions. Mouth:  edentulous Lungs:  Clear to auscultation bilaterally. No wheezes, rales, or rhonchi. No distress.  Heart:  S1, S2 present without murmurs appreciated.  Abdomen:  +BS, soft, non-tender and non-distended. Liver margin palpable 3 fingerbreadths below costal margin, query splenomegaly Rectal:  Deferred  Msk:  Symmetrical without gross deformities. Normal posture. Extremities:  Without edema. Neurologic:  Alert and  oriented x4;  grossly normal neurologically. Skin:  Intact without significant lesions or rashes. Psych:  Alert and cooperative. Normal mood and affect.

## 2014-05-01 NOTE — Patient Instructions (Signed)
Please complete the Hep A and B vaccinations as soon as possible.  Complete the lab work at the time of the ultrasound.  We have scheduled you for a colonoscopy with a possible upper endoscopy with Dr. Gala Romney in the near future.  We will start treatment for Hep C in the future after labs and imaging are completed. We will wait for your wife to start treatment so you can do it together.

## 2014-05-03 DIAGNOSIS — B182 Chronic viral hepatitis C: Secondary | ICD-10-CM | POA: Insufficient documentation

## 2014-05-03 DIAGNOSIS — Z1211 Encounter for screening for malignant neoplasm of colon: Secondary | ICD-10-CM | POA: Insufficient documentation

## 2014-05-03 HISTORY — DX: Chronic viral hepatitis C: B18.2

## 2014-05-03 LAB — HCV RNA QUANT RFLX ULTRA OR GENOTYP
HCV QUANT LOG: 6.66 {Log} — AB (ref <1.18)
HCV QUANT: 4609045 [IU]/mL — AB (ref <15)

## 2014-05-03 NOTE — Progress Notes (Signed)
Primary Care Physician:  Glo Herring., MD Primary Gastroenterologist:  Dr. Gala Romney   Chief Complaint  Patient presents with  . Colonoscopy    never had a tcs   . Constipation    HPI:   [name] presents today to discuss initial screening colonoscopy. Occasional low-volume hematochezia. No changes in bowel habits. Doesn't like strange toilets; will not go to a bathroom unless at home. On narcotics, with occasional constipation. Drinks coffee and fine. Hydrocodone at least twice a day. Rare reflux symptoms. No dysphagia. No unexplained weight loss or lack of appetite.   Notes history of Hepatitis C. No prior treatment. Wife has Hep C as well; she is a patient of ours. Both would like to complete treatment together.   Past Medical History  Diagnosis Date  . Anxiety   . Hepatitis C     no prior treatment  . Asthma   . Myocardial infarction     possible?   . Depression   . DVT (deep venous thrombosis) 2009    Past Surgical History  Procedure Laterality Date  . Femoral-tibial bypass graft  2009    Right side using non-reversed GSV   By Dr. Oneida Alar  . Femoral-tibial bypass graft  11/24/2007    Right femoral to anterior tibial BPG   by Dr. Oneida Alar  . 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 Aortogram  08/08/2009    for LLE claudication     By Dr. Oneida Alar  . Cholecystectomy    . Bladder tumor excision  8/812  . Hand tendon surgery Left 2013    Left 5th finger    Current Outpatient Prescriptions  Medication Sig Dispense Refill  . alprazolam (XANAX) 2 MG tablet Take 2 mg by mouth 4 (four) times daily as needed.       [name] aspirin 81 MG EC tablet Take 81 mg by mouth daily.        . citalopram (CELEXA) 20 MG tablet Take 40 mg by mouth daily.       [name] HYDROcodone-acetaminophen (NORCO) 10-325 MG per tablet Take by mouth as needed.       No current facility-administered medications for this visit.    Allergies as of 05/01/2014 - Review Complete 05/01/2014    Allergen Reaction Noted  . Lyrica [pregabalin] Palpitations 02/12/2011  . Neurontin [gabapentin] Palpitations 02/12/2011    Family History  Problem Relation Age of Onset  . Colon cancer Neg Hx     History   Social History  . Marital Status: Married    Spouse Name: N/A    Number of Children: N/A  . Years of Education: N/A   Occupational History  . Not on file.   Social History Main Topics  . Smoking status: Current Every Day Smoker -- 1.50 packs/day for 20 years    Types: Cigarettes  . Smokeless tobacco: Never Used     Comment: cigarettes  . Alcohol Use: No     Comment: Patient quit drinking about a year ago; history of heavy ETOH use  . Drug Use: No     Comment: marijuana  . Sexual Activity: Not on file   Other Topics Concern  . Not on file   Social History Narrative  . No narrative on file    Review of Systems: As mentioned in HPI  Physical Exam: BP 113/73  Pulse 65  Temp(Src) 97.6 F (36.4 C) (Oral)  Ht 6' (1.829  m)  Wt 202 lb 6.4 oz (91.808 kg)  BMI 27.44 kg/m2 General:   Alert and oriented. Pleasant and cooperative. Well-nourished and well-developed.  Head:  Normocephalic and atraumatic. Eyes:  Without icterus, sclera clear and conjunctiva pink.  Ears:  Normal auditory acuity. Nose:  No deformity, discharge,  or lesions. Mouth:  No deformity or lesions, oral mucosa pink.  Neck:  Supple, without mass or thyromegaly. Lungs:  Clear to auscultation bilaterally. No wheezes, rales, or rhonchi. No distress.  Heart:  S1, S2 present without murmurs appreciated.  Abdomen:  +BS, soft, non-tender and non-distended. No HSM noted. No guarding or rebound. No masses appreciated.  Rectal:  Deferred  Msk:  Symmetrical without gross deformities. Normal posture. Pulses:  Normal pulses noted. Extremities:  Without clubbing or edema. Neurologic:  Alert and  oriented x4;  grossly normal neurologically. Skin:  Intact without significant lesions or rashes. Cervical  Nodes:  No significant cervical adenopathy. Psych:  Alert and cooperative. Normal mood and affect.

## 2014-05-03 NOTE — Assessment & Plan Note (Signed)
Occasional low-volume hematochezia likely benign anorectal source. Intermittent constipation mild with improvement in dietary changes. Declines treatment for constipation.  Proceed with TCS with Dr. Gala Romney in near future: the risks, benefits, and alternatives have been discussed with the patient in detail. The patient states understanding and desires to proceed. PROPOFOL due to hx of ETOH abuse, marijuana, polypharmacy.

## 2014-05-03 NOTE — Assessment & Plan Note (Signed)
53 year old male with history of chronic Hep C, no prior treatment. Unknown genotype. Hepatomegaly on exam. Hx of chronic ETOH abuse in the past but abstains now. Needs routine labs, genotype, [place] of abdomen with elastography, and vaccinations. Possible upper endoscopy if evidence of cirrhosis for variceal screening. Risks and benefits discussed in detail with stated understanding.

## 2014-05-03 NOTE — Progress Notes (Signed)
Quick Note:  Potassium mildly elevated at 5.4, which is just a smidge above normal. Renal function normal.  I reviewed his med list, and he is not no supplemental potassium.  Attempted to call patient so this can be rechecked. Please have him repeat BMP on Monday. ______

## 2014-05-06 ENCOUNTER — Other Ambulatory Visit: Payer: Self-pay | Admitting: Gastroenterology

## 2014-05-06 ENCOUNTER — Other Ambulatory Visit: Payer: Self-pay

## 2014-05-06 ENCOUNTER — Telehealth: Payer: Self-pay | Admitting: Internal Medicine

## 2014-05-06 DIAGNOSIS — E876 Hypokalemia: Secondary | ICD-10-CM

## 2014-05-06 LAB — HEPATITIS C GENOTYPE

## 2014-05-06 NOTE — Telephone Encounter (Signed)
I spoke with the pts wife.

## 2014-05-06 NOTE — Telephone Encounter (Signed)
Pt's wife [name]) has left 2 messages over the weekend that she was returning someone's call and didn't know if it was for her of husband, [name]. Looks like AS attempted to call Abdo one day last week because Potassium in mildly elevated  At 5.4 and needs to be recheck and to repeat BMP on Monday. Please call patient back at [phone]

## 2014-05-06 NOTE — Progress Notes (Signed)
Cc'd to pcp 

## 2014-05-07 ENCOUNTER — Ambulatory Visit (HOSPITAL_COMMUNITY)
Admission: RE | Admit: 2014-05-07 | Discharge: 2014-05-07 | Disposition: A | Discharge status: Home / Self Care | Payer: Medicare Other | Source: Ambulatory Visit | Attending: Gastroenterology | Admitting: Gastroenterology

## 2014-05-07 DIAGNOSIS — Z9889 Other specified postprocedural states: Secondary | ICD-10-CM | POA: Diagnosis not present

## 2014-05-07 DIAGNOSIS — B182 Chronic viral hepatitis C: Secondary | ICD-10-CM

## 2014-05-07 DIAGNOSIS — R935 Abnormal findings on diagnostic imaging of other abdominal regions, including retroperitoneum: Secondary | ICD-10-CM | POA: Diagnosis not present

## 2014-05-07 LAB — BASIC METABOLIC PANEL
BUN: 12 mg/dL (ref 6–23)
CO2: 26 mEq/L (ref 19–32)
CREATININE: 0.92 mg/dL (ref 0.50–1.35)
Calcium: 9.5 mg/dL (ref 8.4–10.5)
Chloride: 101 mEq/L (ref 96–112)
GLUCOSE: 97 mg/dL (ref 70–99)
Potassium: 4.7 mEq/L (ref 3.5–5.3)
Sodium: 136 mEq/L (ref 135–145)

## 2014-05-07 NOTE — Progress Notes (Signed)
Quick Note:  Please let patient know his potassium is normal. Await Anna's input upon her return regarding HCV treatment. ______

## 2014-05-13 ENCOUNTER — Encounter (HOSPITAL_COMMUNITY): Payer: Self-pay | Admitting: Pharmacy Technician

## 2014-05-15 ENCOUNTER — Encounter: Payer: Self-pay | Admitting: Vascular Surgery

## 2014-05-16 ENCOUNTER — Encounter: Payer: Self-pay | Admitting: Vascular Surgery

## 2014-05-16 ENCOUNTER — Ambulatory Visit (INDEPENDENT_AMBULATORY_CARE_PROVIDER_SITE_OTHER): Payer: Medicare Other | Admitting: Vascular Surgery

## 2014-05-16 ENCOUNTER — Ambulatory Visit (INDEPENDENT_AMBULATORY_CARE_PROVIDER_SITE_OTHER)
Admission: RE | Admit: 2014-05-16 | Discharge: 2014-05-16 | Disposition: A | Discharge status: Home / Self Care | Payer: Medicare Other | Source: Ambulatory Visit | Attending: Vascular Surgery | Admitting: Vascular Surgery

## 2014-05-16 ENCOUNTER — Ambulatory Visit (HOSPITAL_COMMUNITY)
Admission: RE | Admit: 2014-05-16 | Discharge: 2014-05-16 | Disposition: A | Discharge status: Home / Self Care | Payer: Medicare Other | Source: Ambulatory Visit | Attending: Vascular Surgery | Admitting: Vascular Surgery

## 2014-05-16 VITALS — BP 119/74 | HR 64 | Temp 98.5°F | Resp 16 | Ht 72.0 in | Wt 200.0 lb

## 2014-05-16 DIAGNOSIS — I739 Peripheral vascular disease, unspecified: Secondary | ICD-10-CM

## 2014-05-16 DIAGNOSIS — Z48812 Encounter for surgical aftercare following surgery on the circulatory system: Secondary | ICD-10-CM

## 2014-05-16 DIAGNOSIS — F17200 Nicotine dependence, unspecified, uncomplicated: Secondary | ICD-10-CM

## 2014-05-16 NOTE — Addendum Note (Signed)
Addended by: Mena Goes on: 05/16/2014 04:48 PM   Modules accepted: Orders

## 2014-05-16 NOTE — Progress Notes (Signed)
[facility] OFFICE PROGRESS NOTE  Redmond School, MD  HPI: This is a 53 y.o. male who presents for follow-up. He previously had a right leg bypass that ultimately resulted in a right below-knee amputation. He also had a left external iliac artery stent on 08/25/08. He has known left popliteal artery occlusion. He denies any major changes in left leg pain since his last visit on 11/08/13. He describes intermittent claudication with symptoms after walking 20 yards. He denies any rest pain or non-healing wounds of the left leg. He does ambulate with the use of a prosthetic right leg. He continues to smoke.   Past Medical History  Diagnosis Date  . Anxiety   . Hepatitis C     no prior treatment  . Asthma   . Myocardial infarction     possible?   . Depression   . DVT (deep venous thrombosis) 2009   Past Surgical History  Procedure Laterality Date  . Femoral-tibial bypass graft  2009    Right side using non-reversed GSV   By Dr. Oneida Alar  . Femoral-tibial bypass graft  11/24/2007    Right femoral to anterior tibial BPG   by Dr. Oneida Alar  . 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 Aortogram  08/08/2009    for LLE claudication     By Dr. Oneida Alar  . Cholecystectomy    . Bladder tumor excision  8/812  . Hand tendon surgery Left 2013    Left 5th finger    Allergies  Allergen Reactions  . Lyrica [Pregabalin] Palpitations    Chest pain and heart palps.  . Neurontin [Gabapentin] Palpitations    Chest pain and heart palp    Current Outpatient Prescriptions  Medication Sig Dispense Refill  . alprazolam (XANAX) 2 MG tablet Take 2 mg by mouth 4 (four) times daily as needed for anxiety.       [name] aspirin 81 MG EC tablet Take 81 mg by mouth daily.        . citalopram (CELEXA) 20 MG tablet Take 20 mg by mouth daily.       [name] HYDROcodone-acetaminophen (NORCO) 10-325 MG per tablet Take 1 tablet by mouth every 6 (six) hours as needed. pain      . polyethylene  glycol-electrolytes (TRILYTE) 420 G solution Take 4,000 mLs by mouth as directed.  4000 mL  0   No current facility-administered medications for this visit.    Family History  Problem Relation Age of Onset  . Colon cancer Neg Hx   . Vision loss Mother   . Ulcers Father     History   Social History  . Marital Status: Married    Spouse Name: N/A    Number of Children: N/A  . Years of Education: N/A   Occupational History  . Not on file.   Social History Main Topics  . Smoking status: Current Every Day Smoker -- 1.50 packs/day for 20 years    Types: Cigarettes  . Smokeless tobacco: Never Used     Comment: cigarettes  . Alcohol Use: No     Comment: Patient quit drinking about a year ago; history of heavy ETOH use  . Drug Use: No     Comment: marijuana  . Sexual Activity: Not on file   Other Topics Concern  . Not on file   Social History Narrative  . No narrative on file     ROS: [x]  Positive   [ ]   Negative   [ ]  All sytems reviewed and are negative  Cardiovascular: []  chest pain/pressure []  palpitations []  SOB lying flat []  DOE [x]  pain in legs while walking []  pain in feet when lying flat []  hx of DVT []  hx of phlebitis []  swelling in legs []  varicose veins  Pulmonary: []  productive cough []  asthma []  wheezing  Neurologic: []  weakness in []  arms []  legs []  numbness in []  arms []  legs [] difficulty speaking or slurred speech []  temporary loss of vision in one eye []  dizziness  Hematologic: []  bleeding problems []  problems with blood clotting easily  GI []  vomiting blood []  blood in stool  GU: []  burning with urination []  blood in urine  Psychiatric: []  hx of major depression  Integumentary: []  rashes []  ulcers  Constitutional: []  fever []  chills   PHYSICAL EXAMINATION:  Filed Vitals:   05/16/14 1415  BP: 119/74  Pulse: 64  Temp: 98.5 F (36.9 C)  Resp: 16   Body mass index is 27.12 kg/(m^2).  General: WDWN male in  NAD Cardiac: regular rate and rhythm, no murmurs  Pulmonary: clear to auscultation Extremities: Right BKA. Palpable left femoral pulse. Unable to palpate lower DP and PT pulses. No ischemic changes. Motor and sensory intact.    Non-Invasive Vascular Imaging:    ABIs (05/16/14) Right BKA Left 0.58, previously 0.71   ASSESSMENT: 53 y.o. male with left lower extremity peripheral arterial disease. Previous left iliac stent 08/08/18. Right BKA.  PLAN: Slight decrease in ABIs since last visit. 0.58 today, 0.71 six months earlier. He denies any further progression of leg pain. He will follow up in 6 months with lower extremity duplex and ABIs. He was counseled on smoking cessation.    Virgina Jock, PA-C [facility] [phone]  Clinic MD:  Pt seen and examined in conjunction with Dr. Oneida Alar.  History and exam details as above. The patient has had noted no significant change in his lower extremity exam or symptoms. Overall his ABIs are similar. Greater than 3 minutes and today on smoking cessation. Patient will followup in 6 months.  Ruta Hinds, MD [facility] Office: [phone] Pager: [phone]

## 2014-05-22 NOTE — Progress Notes (Signed)
Quick Note:  Patient has cirrhosis with Metavir score of 4. Chronic Hep C.  Harvoni treatment X 12 weeks indicated.  He WILL NEED an EGD at time of TCS, which is already tentatively scheduled.  He had wanted to undergo treatment with his wife. Please let him know we can get the ball rolling at least and submit the prescription but perhaps request that it not be filled until ready. ______

## 2014-05-22 NOTE — Patient Instructions (Signed)
DAMARRI RAMPY  05/22/2014   Your procedure is scheduled on:   05/30/2014  Report to [facility] at  51  AM.  Call this number if you have problems the morning of surgery: [phone]   Remember:   Do not eat food or drink liquids after midnight.   Take these medicines the morning of surgery with A SIP OF WATER: xanax, celexa, norco   Do not wear jewelry, make-up or nail polish.  Do not wear lotions, powders, or perfumes.   Do not shave 48 hours prior to surgery. Men may shave face and neck.  Do not bring valuables to the hospital.  [facility] is not responsible for any belongings or valuables.               Contacts, dentures or bridgework may not be worn into surgery.  Leave suitcase in the car. After surgery it may be brought to your room.  For patients admitted to the hospital, discharge time is determined by your treatment team.               Patients discharged the day of surgery will not be allowed to drive home.  Name and phone number of your driver: family  Special Instructions: N/A   Please read over the following fact sheets that you were given: Pain Booklet, Coughing and Deep Breathing, Surgical Site Infection Prevention, Anesthesia Post-op Instructions and Care and Recovery After Surgery Colonoscopy A colonoscopy is an exam to look at the entire large intestine (colon). This exam can help find problems such as tumors, polyps, inflammation, and areas of bleeding. The exam takes about 1 hour.  LET [facility] CARE PROVIDER KNOW ABOUT:   Any allergies you have.  All medicines you are taking, including vitamins, herbs, eye drops, creams, and over-the-counter medicines.  Previous problems you or members of your family have had with the use of anesthetics.  Any blood disorders you have.  Previous surgeries you have had.  Medical conditions you have. RISKS AND COMPLICATIONS  Generally, this is a safe procedure. However, as with any procedure, complications can  occur. Possible complications include:  Bleeding.  Tearing or rupture of the colon wall.  Reaction to medicines given during the exam.  Infection (rare). BEFORE THE PROCEDURE   Ask your health care provider about changing or stopping your regular medicines.  You may be prescribed an oral bowel prep. This involves drinking a large amount of medicated liquid, starting the day before your procedure. The liquid will cause you to have multiple loose stools until your stool is almost clear or light green. This cleans out your colon in preparation for the procedure.  Do not eat or drink anything else once you have started the bowel prep, unless your health care provider tells you it is safe to do so.  Arrange for someone to drive you home after the procedure. PROCEDURE   You will be given medicine to help you relax (sedative).  You will lie on your side with your knees bent.  A long, flexible tube with a light and camera on the end (colonoscope) will be inserted through the rectum and into the colon. The camera sends video back to a computer screen as it moves through the colon. The colonoscope also releases carbon dioxide gas to inflate the colon. This helps your health care provider see the area better.  During the exam, your health care provider may take a small tissue  sample (biopsy) to be examined under a microscope if any abnormalities are found.  The exam is finished when the entire colon has been viewed. AFTER THE PROCEDURE   Do not drive for 24 hours after the exam.  You may have a small amount of blood in your stool.  You may pass moderate amounts of gas and have mild abdominal cramping or bloating. This is caused by the gas used to inflate your colon during the exam.  Ask when your test results will be ready and how you will get your results. Make sure you get your test results. Document Released: 09/10/2000 Document Revised: 07/04/2013 Document Reviewed:  05/21/2013 [facility] Patient Information 2015 [place], [place]. This information is not intended to replace advice given to you by your health care provider. Make sure you discuss any questions you have with your health care provider. Esophagogastroduodenoscopy Esophagogastroduodenoscopy (EGD) is a procedure to examine the lining of the esophagus, stomach, and first part of the small intestine (duodenum). A long, flexible, lighted tube with a camera attached (endoscope) is inserted down the throat to view these organs. This procedure is done to detect problems or abnormalities, such as inflammation, bleeding, ulcers, or growths, in order to treat them. The procedure lasts about 5-20 minutes. It is usually an outpatient procedure, but it may need to be performed in emergency cases in the hospital. LET YOUR CAREGIVER KNOW ABOUT:   Allergies to food or medicine.  All medicines you are taking, including vitamins, herbs, eyedrops, and over-the-counter medicines and creams.  Use of steroids (by mouth or creams).  Previous problems you or members of your family have had with the use of anesthetics.  Any blood disorders you have.  Previous surgeries you have had.  Other health problems you have.  Possibility of pregnancy, if this applies. RISKS AND COMPLICATIONS  Generally, EGD is a safe procedure. However, as with any procedure, complications can occur. Possible complications include:  Infection.  Bleeding.  Tearing (perforation) of the esophagus, stomach, or duodenum.  Difficulty breathing or not being able to breath.  Excessive sweating.  Spasms of the larynx.  Slowed heartbeat.  Low blood pressure. BEFORE THE PROCEDURE  Do not eat or drink anything for 6-8 hours before the procedure or as directed by your caregiver.  Ask your caregiver about changing or stopping your regular medicines.  If you wear dentures, be prepared to remove them before the procedure.  Arrange for  someone to drive you home after the procedure. PROCEDURE   A vein will be accessed to give medicines and fluids. A medicine to relax you (sedative) and a pain reliever will be given through that access into the vein.  A numbing medicine (local anesthetic) may be sprayed on your throat for comfort and to stop you from gagging or coughing.  A mouth guard may be placed in your mouth to protect your teeth and to keep you from biting on the endoscope.  You will be asked to lie on your left side.  The endoscope is inserted down your throat and into the esophagus, stomach, and duodenum.  Air is put through the endoscope to allow your caregiver to view the lining of your esophagus clearly.  The esophagus, stomach, and duodenum is then examined. During the exam, your caregiver may:  Remove tissue to be examined under a microscope (biopsy) for inflammation, infection, or other medical problems.  Remove growths.  Remove objects (foreign bodies) that are stuck.  Treat any bleeding with medicines or other  devices that stop tissues from bleeding (hot cautery, clipping devices).  Widen (dilate) or stretch narrowed areas of the esophagus and stomach.  The endoscope will then be withdrawn. AFTER THE PROCEDURE  You will be taken to a recovery area to be monitored. You will be able to go home once you are stable and alert.  Do not eat or drink anything until the local anesthetic and numbing medicines have worn off. You may choke.  It is normal to feel bloated, have pain with swallowing, or have a sore throat for a short time. This will wear off.  Your caregiver should be able to discuss his or her findings with you. It will take longer to discuss the test results if any biopsies were taken. Document Released: 01/14/2005 Document Revised: 01/28/2014 Document Reviewed: 08/16/2012 [facility] Patient Information 2015 [place], [place]. This information is not intended to replace advice given to you by  your health care provider. Make sure you discuss any questions you have with your health care provider. PATIENT INSTRUCTIONS POST-ANESTHESIA  IMMEDIATELY FOLLOWING SURGERY:  Do not drive or operate machinery for the first twenty four hours after surgery.  Do not make any important decisions for twenty four hours after surgery or while taking narcotic pain medications or sedatives.  If you develop intractable nausea and vomiting or a severe headache please notify your doctor immediately.  FOLLOW-UP:  Please make an appointment with your surgeon as instructed. You do not need to follow up with anesthesia unless specifically instructed to do so.  WOUND CARE INSTRUCTIONS (if applicable):  Keep a dry clean dressing on the anesthesia/puncture wound site if there is drainage.  Once the wound has quit draining you may leave it open to air.  Generally you should leave the bandage intact for twenty four hours unless there is drainage.  If the epidural site drains for more than 36-48 hours please call the anesthesia department.  QUESTIONS?:  Please feel free to call your physician or the hospital operator if you have any questions, and they will be happy to assist you.

## 2014-05-22 NOTE — Progress Notes (Signed)
Quick Note:  [name], does he want to go ahead and start the process for [facility] approval? ______

## 2014-05-23 ENCOUNTER — Encounter (HOSPITAL_COMMUNITY)
Admission: RE | Admit: 2014-05-23 | Discharge: 2014-05-23 | Disposition: A | Discharge status: Home / Self Care | Payer: Medicare Other | Source: Ambulatory Visit | Attending: Internal Medicine | Admitting: Internal Medicine

## 2014-05-23 ENCOUNTER — Encounter (HOSPITAL_COMMUNITY): Payer: Self-pay

## 2014-05-23 DIAGNOSIS — Z01818 Encounter for other preprocedural examination: Secondary | ICD-10-CM | POA: Insufficient documentation

## 2014-05-23 DIAGNOSIS — K921 Melena: Secondary | ICD-10-CM | POA: Diagnosis present

## 2014-05-23 DIAGNOSIS — Z1389 Encounter for screening for other disorder: Secondary | ICD-10-CM | POA: Diagnosis present

## 2014-05-24 ENCOUNTER — Telehealth: Payer: Self-pay | Admitting: Internal Medicine

## 2014-05-24 NOTE — Telephone Encounter (Signed)
[name] called stating the house received a phone call.  Michela Pitcher it could be about her husband Amin or her.  [phone]

## 2014-05-27 NOTE — Telephone Encounter (Signed)
I spoke with Nicki Reaper, he is the one I was trying to get in touch with.

## 2014-05-30 ENCOUNTER — Ambulatory Visit (HOSPITAL_COMMUNITY)
Admission: RE | Admit: 2014-05-30 | Discharge: 2014-05-30 | Disposition: A | Discharge status: Home / Self Care | Payer: Medicare Other | Source: Ambulatory Visit | Attending: Internal Medicine | Admitting: Internal Medicine

## 2014-05-30 ENCOUNTER — Encounter (HOSPITAL_COMMUNITY)
Admission: RE | Disposition: A | Discharge status: Home / Self Care | Payer: Self-pay | Source: Ambulatory Visit | Attending: Internal Medicine

## 2014-05-30 ENCOUNTER — Encounter (HOSPITAL_COMMUNITY): Payer: Self-pay | Admitting: Anesthesiology

## 2014-05-30 ENCOUNTER — Encounter: Payer: Self-pay | Admitting: Internal Medicine

## 2014-05-30 ENCOUNTER — Encounter (HOSPITAL_COMMUNITY): Payer: Medicare Other | Admitting: Anesthesiology

## 2014-05-30 ENCOUNTER — Ambulatory Visit (HOSPITAL_COMMUNITY): Payer: Medicare Other | Admitting: Anesthesiology

## 2014-05-30 DIAGNOSIS — Z7982 Long term (current) use of aspirin: Secondary | ICD-10-CM | POA: Insufficient documentation

## 2014-05-30 DIAGNOSIS — Z86718 Personal history of other venous thrombosis and embolism: Secondary | ICD-10-CM | POA: Diagnosis not present

## 2014-05-30 DIAGNOSIS — Z888 Allergy status to other drugs, medicaments and biological substances status: Secondary | ICD-10-CM | POA: Insufficient documentation

## 2014-05-30 DIAGNOSIS — D126 Benign neoplasm of colon, unspecified: Secondary | ICD-10-CM

## 2014-05-30 DIAGNOSIS — J45909 Unspecified asthma, uncomplicated: Secondary | ICD-10-CM | POA: Insufficient documentation

## 2014-05-30 DIAGNOSIS — F329 Major depressive disorder, single episode, unspecified: Secondary | ICD-10-CM | POA: Diagnosis not present

## 2014-05-30 DIAGNOSIS — Z9089 Acquired absence of other organs: Secondary | ICD-10-CM | POA: Diagnosis not present

## 2014-05-30 DIAGNOSIS — Z79899 Other long term (current) drug therapy: Secondary | ICD-10-CM | POA: Insufficient documentation

## 2014-05-30 DIAGNOSIS — F3289 Other specified depressive episodes: Secondary | ICD-10-CM | POA: Diagnosis not present

## 2014-05-30 DIAGNOSIS — K257 Chronic gastric ulcer without hemorrhage or perforation: Secondary | ICD-10-CM

## 2014-05-30 DIAGNOSIS — K921 Melena: Secondary | ICD-10-CM

## 2014-05-30 DIAGNOSIS — F411 Generalized anxiety disorder: Secondary | ICD-10-CM | POA: Insufficient documentation

## 2014-05-30 DIAGNOSIS — B1920 Unspecified viral hepatitis C without hepatic coma: Secondary | ICD-10-CM | POA: Diagnosis not present

## 2014-05-30 DIAGNOSIS — K2960 Other gastritis without bleeding: Secondary | ICD-10-CM | POA: Insufficient documentation

## 2014-05-30 HISTORY — PX: POLYPECTOMY: SHX5525

## 2014-05-30 HISTORY — PX: ESOPHAGOGASTRODUODENOSCOPY (EGD) WITH PROPOFOL: SHX5813

## 2014-05-30 HISTORY — PX: BIOPSY: SHX5522

## 2014-05-30 HISTORY — PX: COLONOSCOPY WITH PROPOFOL: SHX5780

## 2014-05-30 SURGERY — COLONOSCOPY WITH PROPOFOL
Anesthesia: Monitor Anesthesia Care

## 2014-05-30 MED ORDER — LIDOCAINE HCL (PF) 1 % IJ SOLN
INTRAMUSCULAR | Status: AC
  Filled 2014-05-30: qty 5

## 2014-05-30 MED ORDER — LACTATED RINGERS IV SOLN
INTRAVENOUS | Status: DC
Start: 2014-05-30 — End: 2014-05-30
  Administered 2014-05-30: 08:00:00 via INTRAVENOUS

## 2014-05-30 MED ORDER — ONDANSETRON HCL 4 MG/2ML IJ SOLN
INTRAMUSCULAR | Status: AC
  Filled 2014-05-30: qty 2

## 2014-05-30 MED ORDER — PROPOFOL INFUSION 10 MG/ML OPTIME
INTRAVENOUS | Status: DC | PRN
  Administered 2014-05-30: 125 ug/kg/min via INTRAVENOUS
  Administered 2014-05-30: 10:00:00 via INTRAVENOUS

## 2014-05-30 MED ORDER — FENTANYL CITRATE 0.05 MG/ML IJ SOLN
INTRAMUSCULAR | Status: AC
  Filled 2014-05-30: qty 2

## 2014-05-30 MED ORDER — LIDOCAINE VISCOUS 2 % MT SOLN
OROMUCOSAL | Status: AC
  Filled 2014-05-30: qty 15

## 2014-05-30 MED ORDER — LIDOCAINE HCL 1 % IJ SOLN
INTRAMUSCULAR | Status: DC | PRN
  Administered 2014-05-30: 50 mg via INTRADERMAL

## 2014-05-30 MED ORDER — MIDAZOLAM HCL 2 MG/2ML IJ SOLN
INTRAMUSCULAR | Status: AC
  Filled 2014-05-30: qty 2

## 2014-05-30 MED ORDER — MIDAZOLAM HCL 5 MG/5ML IJ SOLN
INTRAMUSCULAR | Status: DC | PRN
  Administered 2014-05-30: 2 mg via INTRAVENOUS

## 2014-05-30 MED ORDER — WATER FOR IRRIGATION, STERILE IR SOLN
Status: DC | PRN
  Administered 2014-05-30: 1000 mL via SURGICAL_CAVITY

## 2014-05-30 MED ORDER — FENTANYL CITRATE 0.05 MG/ML IJ SOLN
25.0000 ug | INTRAMUSCULAR | Status: AC
  Administered 2014-05-30 (×2): 25 ug via INTRAVENOUS

## 2014-05-30 MED ORDER — GLYCOPYRROLATE 0.2 MG/ML IJ SOLN
0.2000 mg | Freq: Once | INTRAMUSCULAR | Status: AC
  Administered 2014-05-30: 0.2 mg via INTRAVENOUS

## 2014-05-30 MED ORDER — STERILE WATER FOR IRRIGATION IR SOLN
Status: DC | PRN
  Administered 2014-05-30: 10:00:00

## 2014-05-30 MED ORDER — PROPOFOL 10 MG/ML IV EMUL
INTRAVENOUS | Status: AC
Start: 2014-05-30 — End: 2014-05-30
  Filled 2014-05-30: qty 20

## 2014-05-30 MED ORDER — ONDANSETRON HCL 4 MG/2ML IJ SOLN
4.0000 mg | Freq: Once | INTRAMUSCULAR | Status: AC
  Administered 2014-05-30: 4 mg via INTRAVENOUS

## 2014-05-30 MED ORDER — MIDAZOLAM HCL 2 MG/2ML IJ SOLN
1.0000 mg | INTRAMUSCULAR | Status: DC | PRN
  Administered 2014-05-30 (×2): 2 mg via INTRAVENOUS
  Filled 2014-05-30: qty 2

## 2014-05-30 MED ORDER — ONDANSETRON HCL 4 MG/2ML IJ SOLN: 4.0000 mg | Freq: Once | INTRAMUSCULAR | Status: DC | PRN

## 2014-05-30 MED ORDER — LIDOCAINE VISCOUS 2 % MT SOLN
OROMUCOSAL | Status: DC | PRN
  Administered 2014-05-30: 1 via OROMUCOSAL

## 2014-05-30 MED ORDER — LIDOCAINE VISCOUS 2 % MT SOLN
15.0000 mL | Freq: Once | OROMUCOSAL | Status: AC
  Administered 2014-05-30: 15 mL via OROMUCOSAL
  Filled 2014-05-30: qty 15

## 2014-05-30 MED ORDER — GLYCOPYRROLATE 0.2 MG/ML IJ SOLN
INTRAMUSCULAR | Status: AC
  Filled 2014-05-30: qty 1

## 2014-05-30 MED ORDER — PROPOFOL 10 MG/ML IV BOLUS
INTRAVENOUS | Status: AC
  Filled 2014-05-30: qty 20

## 2014-05-30 MED ORDER — PROPOFOL 10 MG/ML IV BOLUS
INTRAVENOUS | Status: DC | PRN
  Administered 2014-05-30: 20 mg via INTRAVENOUS

## 2014-05-30 MED ORDER — FENTANYL CITRATE 0.05 MG/ML IJ SOLN
25.0000 ug | INTRAMUSCULAR | Status: DC | PRN
Start: 2014-05-30 — End: 2014-05-30

## 2014-05-30 SURGICAL SUPPLY — 27 items
BLOCK BITE 60FR ADLT L/F BLUE (MISCELLANEOUS) ×1 IMPLANT
DEVICE CLIP HEMOSTAT 235CM (CLIP) IMPLANT
ELECT REM PT RETURN 9FT ADLT (ELECTROSURGICAL) ×3
ELECTRODE REM PT RTRN 9FT ADLT (ELECTROSURGICAL) IMPLANT
FCP BXJMBJMB 240X2.8X (CUTTING FORCEPS)
FLOOR PAD 36X40 (MISCELLANEOUS) ×3
FORCEPS BIOP RAD 4 LRG CAP 4 (CUTTING FORCEPS) ×1 IMPLANT
FORCEPS BIOP RJ4 240 W/NDL (CUTTING FORCEPS)
FORCEPS BXJMBJMB 240X2.8X (CUTTING FORCEPS) IMPLANT
FORMALIN 10 PREFIL 20ML (MISCELLANEOUS) IMPLANT
INJECTOR/SNARE I SNARE (MISCELLANEOUS) IMPLANT
KIT CLEAN ENDO COMPLIANCE (KITS) ×3 IMPLANT
LUBRICANT JELLY 4.5OZ STERILE (MISCELLANEOUS) ×1 IMPLANT
MANIFOLD NEPTUNE II (INSTRUMENTS) ×1 IMPLANT
NDL SCLEROTHERAPY 25GX240 (NEEDLE) IMPLANT
NEEDLE SCLEROTHERAPY 25GX240 (NEEDLE) IMPLANT
PAD FLOOR 36X40 (MISCELLANEOUS) IMPLANT
PROBE APC STR FIRE (PROBE) IMPLANT
PROBE INJECTION GOLD (MISCELLANEOUS)
PROBE INJECTION GOLD 7FR (MISCELLANEOUS) IMPLANT
SNARE ROTATE MED OVAL 20MM (MISCELLANEOUS) ×1 IMPLANT
SNARE SHORT THROW 13M SML OVAL (MISCELLANEOUS) ×3 IMPLANT
SYR 50ML LL SCALE MARK (SYRINGE) ×1 IMPLANT
SYR INFLATION 60ML (SYRINGE) ×3 IMPLANT
TRAP SPECIMEN MUCOUS 40CC (MISCELLANEOUS) IMPLANT
TUBING IRRIGATION ENDOGATOR (MISCELLANEOUS) ×1 IMPLANT
WATER STERILE IRR 1000ML POUR (IV SOLUTION) ×1 IMPLANT

## 2014-05-30 NOTE — Anesthesia Preprocedure Evaluation (Addendum)
Anesthesia Evaluation  Patient identified by MRN, date of birth, ID band Patient awake    Reviewed: Allergy & Precautions, H&P , NPO status , Patient's Chart, lab work & pertinent test results  Airway Mallampati: II TM Distance: >3 FB     Dental  (+) Edentulous Upper, Edentulous Lower   Pulmonary asthma , COPDCurrent Smoker,  breath sounds clear to auscultation        Cardiovascular + Peripheral Vascular Disease and DVT Rhythm:Regular Rate:Normal     Neuro/Psych PSYCHIATRIC DISORDERS Anxiety Depression    GI/Hepatic (+) Hepatitis -, C  Endo/Other    Renal/GU      Musculoskeletal   Abdominal   Peds  Hematology   Anesthesia Other Findings   Reproductive/Obstetrics                          Anesthesia Physical Anesthesia Plan  ASA: III  Anesthesia Plan: MAC   Post-op Pain Management:    Induction: Intravenous  Airway Management Planned: Simple Face Mask  Additional Equipment:   Intra-op Plan:   Post-operative Plan:   Informed Consent: I have reviewed the patients History and Physical, chart, labs and discussed the procedure including the risks, benefits and alternatives for the proposed anesthesia with the patient or authorized representative who has indicated his/her understanding and acceptance.     Plan Discussed with:   Anesthesia Plan Comments:         Anesthesia Quick Evaluation

## 2014-05-30 NOTE — Anesthesia Procedure Notes (Signed)
Anesthesia Regional Block:  Bier block (IV Regional)  Pre-Anesthetic Checklist: ,, timeout performed, Correct Patient, Correct Site, Correct Laterality, Correct Procedure,, site marked, surgical consent,, at surgeon's request Needles:  Injection technique: Single-shot  Needle Type: Other      Needle Gauge: 22 and 22 G    Additional Needles: Bier block (IV Regional)  Nerve Stimulator or Paresthesia:   Additional Responses:  Pulse checked post tourniquet inflation. IV NSL discontinued post injection. Narrative:   Performed by: Personally

## 2014-05-30 NOTE — H&P (View-Only) (Signed)
Primary Care Physician:  Glo Herring., MD Primary Gastroenterologist:  Dr. Gala Romney   Chief Complaint  Patient presents with  . Colonoscopy    never had a tcs   . Constipation    HPI:   [name] presents today to discuss initial screening colonoscopy. Occasional low-volume hematochezia. No changes in bowel habits. Doesn't like strange toilets; will not go to a bathroom unless at home. On narcotics, with occasional constipation. Drinks coffee and fine. Hydrocodone at least twice a day. Rare reflux symptoms. No dysphagia. No unexplained weight loss or lack of appetite.   Past Medical History  Diagnosis Date  . Anxiety   . Hepatitis C     no prior treatment  . Asthma   . Myocardial infarction     possible?   . Depression   . DVT (deep venous thrombosis) 2009    Past Surgical History  Procedure Laterality Date  . Femoral-tibial bypass graft  2009    Right side using non-reversed GSV   By Dr. Oneida Alar  . Femoral-tibial bypass graft  11/24/2007    Right femoral to anterior tibial BPG   by Dr. Oneida Alar  . 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 Aortogram  08/08/2009    for LLE claudication     By Dr. Oneida Alar  . Cholecystectomy    . Bladder tumor excision  8/812  . Hand tendon surgery Left 2013    Left 5th finger    Current Outpatient Prescriptions  Medication Sig Dispense Refill  . alprazolam (XANAX) 2 MG tablet Take 2 mg by mouth 4 (four) times daily as needed.       [name] aspirin 81 MG EC tablet Take 81 mg by mouth daily.        . citalopram (CELEXA) 20 MG tablet Take 40 mg by mouth daily.       [name] HYDROcodone-acetaminophen (NORCO) 10-325 MG per tablet Take by mouth as needed.       No current facility-administered medications for this visit.    Allergies as of 05/01/2014 - Review Complete 05/01/2014  Allergen Reaction Noted  . Lyrica [pregabalin] Palpitations 02/12/2011  . Neurontin [gabapentin] Palpitations 02/12/2011    Family History  Problem  Relation Age of Onset  . Colon cancer Neg Hx     History   Social History  . Marital Status: Married    Spouse Name: N/A    Number of Children: N/A  . Years of Education: N/A   Occupational History  . Not on file.   Social History Main Topics  . Smoking status: Current Every Day Smoker -- 1.50 packs/day for 20 years    Types: Cigarettes  . Smokeless tobacco: Never Used     Comment: cigarettes  . Alcohol Use: No     Comment: Patient quit drinking about a year ago; history of heavy ETOH use  . Drug Use: No     Comment: marijuana  . Sexual Activity: Not on file   Other Topics Concern  . Not on file   Social History Narrative  . No narrative on file    Review of Systems: As mentioned in HPI.    Physical Exam: BP 113/73  Pulse 65  Temp(Src) 97.6 F (36.4 C) (Oral)  Ht 6' (1.829 m)  Wt 202 lb 6.4 oz (91.808 kg)  BMI 27.44 kg/m2 General:   Alert and oriented. Pleasant and cooperative. Well-nourished and well-developed.  Head:  Normocephalic and atraumatic. Eyes:  Without icterus, sclera clear and conjunctiva pink.  Ears:  Normal auditory acuity. Nose:  No deformity, discharge,  or lesions. Mouth:  edentulous Lungs:  Clear to auscultation bilaterally. No wheezes, rales, or rhonchi. No distress.  Heart:  S1, S2 present without murmurs appreciated.  Abdomen:  +BS, soft, non-tender and non-distended. Liver margin palpable 3 fingerbreadths below costal margin, query splenomegaly Rectal:  Deferred  Msk:  Symmetrical without gross deformities. Normal posture. Extremities:  Without edema. Neurologic:  Alert and  oriented x4;  grossly normal neurologically. Skin:  Intact without significant lesions or rashes. Psych:  Alert and cooperative. Normal mood and affect.

## 2014-05-30 NOTE — Transfer of Care (Signed)
Immediate Anesthesia Transfer of Care Note  Patient: [name]  Procedure(s) Performed: Procedure(s) with comments: COLONOSCOPY WITH PROPOFOL (N/A) - In cecum [id number], out at 0950 ESOPHAGOGASTRODUODENOSCOPY (EGD) WITH PROPOFOL (N/A) BIOPSY (N/A) POLYPECTOMY  Patient Location: PACU  Anesthesia Type:MAC  Level of Consciousness: awake, alert  and oriented  Airway & Oxygen Therapy: Patient Spontanous Breathing  Post-op Assessment: Report given to PACU RN  Post vital signs: Reviewed and stable  Complications: No apparent anesthesia complications

## 2014-05-30 NOTE — Op Note (Signed)
[facility] [address] [place], [address]   ENDOSCOPY PROCEDURE REPORT  PATIENT: [name], [name]  MR#: [id number] BIRTHDATE: July 15, 1961 , 5  yrs. old GENDER: Male ENDOSCOPIST: R.  Garfield Cornea, MD FACP FACG REFERRED BY:  Kerin Perna, M.D. PROCEDURE DATE:  05/30/2014 PROCEDURE:     EGD with gastric biopsy  INDICATIONS:     screen for esophageal varices  INFORMED CONSENT:   The risks, benefits, limitations, alternatives and imponderables have been discussed.  The potential for biopsy, esophogeal dilation, etc. have also been reviewed.  Questions have been answered.  All parties agreeable.  Please see the history and physical in the medical record for more information.  MEDICATIONS:     deep sedation per Dr. [facility]  DESCRIPTION OF PROCEDURE:   The     endoscope was introduced through the mouth and advanced to the second portion of the duodenum without difficulty or limitations.  The mucosal surfaces were surveyed very carefully during advancement of the scope and upon withdrawal.  Retroflexion view of the proximal stomach and esophagogastric junction was performed.      FINDINGS: No esophageal varices. Undulating Z line; otherwise, normal-appearing esophagus. Stomach empty. Antral erosions present. No ulcer or infiltrating process. No evidence of portal gastropathy. Patent pylorus. Examination of bulb and second portion revealed erosions in the bulb only.  THERAPEUTIC / DIAGNOSTIC MANEUVERS PERFORMED:  biopsies of the abnormal antral mucosa taken for histologic study   COMPLICATIONS:  None  IMPRESSION:  Antral and bulbar erosions   -    status post gastric biopsy. No evidence of portal gastropathy on today's examination.  RECOMMENDATIONS:   Followup on pathology. Office visit with [place] in 4 weeks. See colonoscopy report.    _______________________________ R. Garfield Cornea, MD FACP [facility] eSigned:  R. Garfield Cornea, MD FACP [facility]  05/30/2014 10:01 AM     CC:

## 2014-05-30 NOTE — Op Note (Signed)
[facility] [address] [place], [address]   COLONOSCOPY PROCEDURE REPORT  PATIENT: [name], [name]  MR#:         [id number] BIRTHDATE: 02/12/1961 , 4  yrs. old GENDER: Male ENDOSCOPIST: R.  Garfield Cornea, MD FACP FACG REFERRED BY:  Kerin Perna, M.D. PROCEDURE DATE:  05/30/2014 PROCEDURE:     Ileocolonoscopy with snare polypectomy  INDICATIONS: Hematochezia; first ever colonoscopy  INFORMED CONSENT:  The risks, benefits, alternatives and imponderables including but not limited to bleeding, perforation as well as the possibility of a missed lesion have been reviewed.  The potential for biopsy, lesion removal, etc. have also been discussed.  Questions have been answered.  All parties agreeable. Please see the history and physical in the medical record for more information.  MEDICATIONS: deep sedation per Dr. [facility]  DESCRIPTION OF PROCEDURE:  After a digital rectal exam was performed, the     colonoscope was advanced from the anus through the rectum and colon to the area of the cecum, ileocecal valve and appendiceal orifice.  The cecum was deeply intubated.  These structures were well-seen and photographed for the record.  From the level of the cecum and ileocecal valve, the scope was slowly and cautiously withdrawn.  The mucosal surfaces were carefully surveyed utilizing scope tip deflection to facilitate fold flattening as needed.  The scope was pulled down into the rectum where a thorough examination including retroflexion was performed.     FINDINGS:  Adequate preparation. Normal rectum. (1) 1 cm pedunculated angry appearing polyp in the distal sigmoid; otherwise, the remainder of the colonic mucosa appeared normal. The distal 5 cm of terminal ileal mucosa appeared normal.  THERAPEUTIC / DIAGNOSTIC MANEUVERS PERFORMED:  The above-mentioned polyp was removed cleanly with one pass of a hot snare cautery. It was recovered for the  pathologist.  COMPLICATIONS: none  CECAL WITHDRAWAL TIME:  11 minutes  IMPRESSION:  Single colonic polyp-likely source of hematochezia-removed as described above  RECOMMENDATIONS:   Followup on pathology. See EGD report.   _______________________________ eSigned:  R. Garfield Cornea, MD FACP [facility] 05/30/2014 10:03 AM   CC:    PATIENT NAME:  [name], [name] MR#: [id number]

## 2014-05-30 NOTE — Progress Notes (Signed)
Drank gallon prep and did enema, last BM yellowish, patient states vomited once.

## 2014-05-30 NOTE — Discharge Instructions (Signed)
Colonoscopy Discharge Instructions  Read the instructions outlined below and refer to this sheet in the next few weeks. These discharge instructions provide you with general information on caring for yourself after you leave the hospital. Your doctor may also give you specific instructions. While your treatment has been planned according to the most current medical practices available, unavoidable complications occasionally occur. If you have any problems or questions after discharge, call Dr. Gala Romney at [phone]. ACTIVITY  You may resume your regular activity, but move at a slower pace for the next 24 hours.   Take frequent rest periods for the next 24 hours.   Walking will help get rid of the air and reduce the bloated feeling in your belly (abdomen).   No driving for 24 hours (because of the medicine (anesthesia) used during the test).    Do not sign any important legal documents or operate any machinery for 24 hours (because of the anesthesia used during the test).  NUTRITION  Drink plenty of fluids.   You may resume your normal diet as instructed by your doctor.   Begin with a light meal and progress to your normal diet. Heavy or fried foods are harder to digest and may make you feel sick to your stomach (nauseated).   Avoid alcoholic beverages for 24 hours or as instructed.  MEDICATIONS  You may resume your normal medications unless your doctor tells you otherwise.  WHAT YOU CAN EXPECT TODAY  Some feelings of bloating in the abdomen.   Passage of more gas than usual.   Spotting of blood in your stool or on the toilet paper.  IF YOU HAD POLYPS REMOVED DURING THE COLONOSCOPY:  No aspirin products for 7 days or as instructed.   No alcohol for 7 days or as instructed.   Eat a soft diet for the next 24 hours.  FINDING OUT THE RESULTS OF YOUR TEST Not all test results are available during your visit. If your test results are not back during the visit, make an appointment  with your caregiver to find out the results. Do not assume everything is normal if you have not heard from your caregiver or the medical facility. It is important for you to follow up on all of your test results.  SEEK IMMEDIATE MEDICAL ATTENTION IF:  You have more than a spotting of blood in your stool.   Your belly is swollen (abdominal distention).   You are nauseated or vomiting.   You have a temperature over 101.   You have abdominal pain or discomfort that is severe or gets worse throughout the day.   EGD Discharge instructions Please read the instructions outlined below and refer to this sheet in the next few weeks. These discharge instructions provide you with general information on caring for yourself after you leave the hospital. Your doctor may also give you specific instructions. While your treatment has been planned according to the most current medical practices available, unavoidable complications occasionally occur. If you have any problems or questions after discharge, please call your doctor. ACTIVITY  You may resume your regular activity but move at a slower pace for the next 24 hours.   Take frequent rest periods for the next 24 hours.   Walking will help expel (get rid of) the air and reduce the bloated feeling in your abdomen.   No driving for 24 hours (because of the anesthesia (medicine) used during the test).   You may shower.   Do not sign any  important legal documents or operate any machinery for 24 hours (because of the anesthesia used during the test).  NUTRITION  Drink plenty of fluids.   You may resume your normal diet.   Begin with a light meal and progress to your normal diet.   Avoid alcoholic beverages for 24 hours or as instructed by your caregiver.  MEDICATIONS  You may resume your normal medications unless your caregiver tells you otherwise.  WHAT YOU CAN EXPECT TODAY  You may experience abdominal discomfort such as a feeling of  fullness or gas pains.  FOLLOW-UP  Your doctor will discuss the results of your test with you.  SEEK IMMEDIATE MEDICAL ATTENTION IF ANY OF THE FOLLOWING OCCUR:  Excessive nausea (feeling sick to your stomach) and/or vomiting.   Severe abdominal pain and distention (swelling).   Trouble swallowing.   Temperature over 101 F (37.8 C).   Rectal bleeding or vomiting of blood.    Polyp information provided  Further recommendations to follow pending review of pathology report  Office visit with [place] in 4 weeks

## 2014-05-30 NOTE — Anesthesia Postprocedure Evaluation (Signed)
  Anesthesia Post-op Note  Patient: [name]  Procedure(s) Performed: Procedure(s) with comments: COLONOSCOPY WITH PROPOFOL (N/A) - In cecum [phone], out at 0950 ESOPHAGOGASTRODUODENOSCOPY (EGD) WITH PROPOFOL (N/A) BIOPSY (N/A) POLYPECTOMY  Patient Location: PACU  Anesthesia Type:MAC  Level of Consciousness: awake, alert  and oriented  Airway and Oxygen Therapy: Patient Spontanous Breathing  Post-op Pain: none  Post-op Assessment: Post-op Vital signs reviewed, Patient's Cardiovascular Status Stable, Respiratory Function Stable, Patent Airway and No signs of Nausea or vomiting  Post-op Vital Signs: Reviewed and stable  Last Vitals:  Filed Vitals:   05/30/14 0905  BP: 106/61  Pulse:   Temp:   Resp: 17    Complications: No apparent anesthesia complications

## 2014-05-30 NOTE — Interval H&P Note (Signed)
History and Physical Interval Note:  05/30/2014 8:56 AM  [name]  has presented today for surgery, with the diagnosis of SCREENING COLONOSCOPY, HEP C  The various methods of treatment have been discussed with the patient and family. After consideration of risks, benefits and other options for treatment, the patient has consented to  Procedure(s) with comments: COLONOSCOPY WITH PROPOFOL (N/A) - 900 ESOPHAGOGASTRODUODENOSCOPY (EGD) WITH PROPOFOL (N/A) as a surgical intervention .  The patient's history has been reviewed, patient examined, no change in status, stable for surgery.  I have reviewed the patient's chart and labs.  Questions were answered to the patient's satisfaction.     [name]  No change. EGD and colonoscopy per plan. Followup potassium normal at 4.7.The risks, benefits, limitations, imponderables and alternatives regarding both EGD and colonoscopy have been reviewed with the patient. Questions have been answered. All parties agreeable.

## 2014-05-31 ENCOUNTER — Encounter (HOSPITAL_COMMUNITY): Payer: Self-pay | Admitting: Internal Medicine

## 2014-06-01 ENCOUNTER — Encounter: Payer: Self-pay | Admitting: Internal Medicine

## 2014-06-10 ENCOUNTER — Encounter: Payer: Self-pay | Admitting: Internal Medicine

## 2014-06-14 MED ORDER — LEDIPASVIR-SOFOSBUVIR 90-400 MG PO TABS: 1.0000 | ORAL_TABLET | Freq: Every day | ORAL | Status: DC

## 2014-06-14 NOTE — Progress Notes (Signed)
Almyra Free: I sent Harvoni X 12 weeks to [facility].

## 2014-06-14 NOTE — Addendum Note (Signed)
Addended by: Orvil Feil on: 06/14/2014 01:29 PM   Modules accepted: Orders, Medications

## 2014-06-17 NOTE — Progress Notes (Signed)
Received message from [place] [facility] requesting further information for patients PA for Harvoni. All requested information has been faxed to [place].

## 2014-06-25 ENCOUNTER — Telehealth: Payer: Self-pay

## 2014-06-25 NOTE — Telephone Encounter (Signed)
T/C from [place] at [employer] stating the [facility] for pt was approved on 06/19/2014 but the wife, [name] is refussing to set a delivery date for this pt, said she is supposed to be getting it also and they will not set up a date until she gets hers.  Please advise and [employer] can be reached at [phone].

## 2014-06-25 NOTE — Telephone Encounter (Signed)
Noted. I think it's great they want to pursue treatment together. Glad that [facility] can hold this. Look forward to seeing her in clinic.

## 2014-06-25 NOTE — Telephone Encounter (Signed)
Clarification: I thought this was addressed and documented in epic, but it may have been in a staff message. I need the wife to come see me before I set her up for Hep C treatment.

## 2014-06-25 NOTE — Telephone Encounter (Signed)
This has been addressed under a different note. I am not able to prescribe Harvoni for the wife until she actually has an office visit with [place]. She has not been seen in awhile. I can see her and we can do standard labs in order to cover all the bases.

## 2014-06-25 NOTE — Telephone Encounter (Signed)
This situation was addressed in the wife- [name] Bohr's chart. I have called [facility] and spoke with a representative. they are aware of the situation and They are going to hold Dereon's medication until [name] is seen here on 07/23/14. They will call to follow up with [place] then.

## 2014-06-28 ENCOUNTER — Ambulatory Visit: Payer: Medicare Other | Admitting: Gastroenterology

## 2014-07-05 ENCOUNTER — Ambulatory Visit (INDEPENDENT_AMBULATORY_CARE_PROVIDER_SITE_OTHER): Payer: Medicare Other | Admitting: Gastroenterology

## 2014-07-05 ENCOUNTER — Encounter: Payer: Self-pay | Admitting: Gastroenterology

## 2014-07-05 VITALS — BP 102/68 | HR 92 | Temp 97.0°F | Ht 72.0 in | Wt 207.0 lb

## 2014-07-05 DIAGNOSIS — D126 Benign neoplasm of colon, unspecified: Secondary | ICD-10-CM

## 2014-07-05 DIAGNOSIS — B182 Chronic viral hepatitis C: Secondary | ICD-10-CM

## 2014-07-05 NOTE — Patient Instructions (Signed)
1. You need to complete your hepatitis A and B vaccinations. I will have our nurse chest to see if we can help you get the hepatitis A. 2. We look forward to getting you started on the Harvoni treatment. 3. Please refrain from alcohol use. It is bad for your liver and can interfere with your medication.

## 2014-07-05 NOTE — Progress Notes (Signed)
      Primary Care Physician: Glo Herring., MD  Primary Gastroenterologist:  Garfield Cornea, MD   Chief Complaint  Patient presents with  . Follow-up    HPI: [name] is a 53 y.o. male here for followup. He underwent EGD/ileocolonoscopy on 05/30/2014 to screen for esophageal varices. No esophageal varices down. Antral erosions present. No evidence of portal gastropathy. The distal 5 cm of the TI was normal. 1 cm pedunculated angry-appearing polyp in the distal sigmoid colon removed. It was a tubular adenoma. Gastric biopsies were unremarkable, no H. pylori. He will require a repeat colonoscopy in 3 years.  He tells me he has received 2 of his hepatitis B vaccinations but none of his hepatitis a. States his PCP has to order it. He is waiting to undergo hepatitis C treatment until his wife [name] is approved. They want to take therapy together.  Otherwise no complaints. Denies any GI symptoms.   Current Outpatient Prescriptions  Medication Sig Dispense Refill  . alprazolam (XANAX) 2 MG tablet Take 2 mg by mouth 4 (four) times daily as needed for anxiety.       [name] aspirin 81 MG EC tablet Take 81 mg by mouth daily.        . citalopram (CELEXA) 20 MG tablet Take 20 mg by mouth daily.       [name] HYDROcodone-acetaminophen (NORCO) 10-325 MG per tablet Take 1 tablet by mouth every 6 (six) hours as needed. pain      . Ledipasvir-Sofosbuvir (HARVONI) 90-400 MG TABS Take 1 tablet by mouth daily. For 12 weeks  30 tablet  2   No current facility-administered medications for this visit.    Allergies as of 07/05/2014 - Review Complete 07/05/2014  Allergen Reaction Noted  . Lyrica [pregabalin] Palpitations 02/12/2011  . Neurontin [gabapentin] Palpitations 02/12/2011    ROS:  General: Negative for anorexia, weight loss, fever, chills, fatigue, weakness. ENT: Negative for hoarseness, difficulty swallowing , nasal congestion. CV: Negative for chest pain, angina, palpitations, dyspnea on  exertion, peripheral edema.  Respiratory: Negative for dyspnea at rest, dyspnea on exertion, cough, sputum, wheezing.  GI: See history of present illness. GU:  Negative for dysuria, hematuria, urinary incontinence, urinary frequency, nocturnal urination.  Endo: Negative for unusual weight change.    Physical Examination:   BP 102/68  Pulse 92  Temp(Src) 97 F (36.1 C) (Oral)  Ht 6' (1.829 m)  Wt 207 lb (93.895 kg)  BMI 28.07 kg/m2  General: Well-nourished, well-developed in no acute distress.  Eyes: No icterus. Mouth: Oropharyngeal mucosa moist and pink , no lesions erythema or exudate. Lungs: Clear to auscultation bilaterally.  Heart: Regular rate and rhythm, no murmurs rubs or gallops.  Abdomen: Bowel sounds are normal, nontender, nondistended, no hepatosplenomegaly or masses, no abdominal bruits or hernia , no rebound or guarding.   Extremities: No lower extremity edema. No clubbing or deformities. Neuro: Alert and oriented x 4   Skin: Warm and dry, no jaundice.   Psych: Alert and cooperative, normal mood and affect.

## 2014-07-11 DIAGNOSIS — D126 Benign neoplasm of colon, unspecified: Secondary | ICD-10-CM | POA: Insufficient documentation

## 2014-07-11 HISTORY — DX: Benign neoplasm of colon, unspecified: D12.6

## 2014-07-11 NOTE — Assessment & Plan Note (Signed)
Due for surveillance colonoscopy 05/2017.

## 2014-07-11 NOTE — Progress Notes (Signed)
cc'ed to pcp °

## 2014-07-11 NOTE — Assessment & Plan Note (Addendum)
Has been approved for Harvoni. Awaiting wife's approval so they can initiate therapy together. She will be seen by [place] in a couple of weeks. Currently up to date on u/s, labs. Needs to complete hep B vaccine and start Hep A vaccine. Requested he continue to pursue with his PCP.

## 2014-08-15 ENCOUNTER — Telehealth: Payer: Self-pay | Admitting: Gastroenterology

## 2014-08-15 NOTE — Telephone Encounter (Signed)
Metavir score F4, indicative of cirrhosis.   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   Needs Harvoni X 12 weeks. This has been sent to the pharmacy already.   Return in 4 weeks after starting. Make sure he does not take any PPI, acid-reducing agents without calling us first. No OTC herbal agents.

## 2014-08-15 NOTE — Telephone Encounter (Signed)
Pt and his wife are aware.

## 2014-08-16 NOTE — Telephone Encounter (Signed)
Spoke with [facility], they are going to call pt and get Harvoni sent out to the pt.

## 2014-09-11 ENCOUNTER — Telehealth: Payer: Self-pay | Admitting: Internal Medicine

## 2014-09-11 NOTE — Telephone Encounter (Signed)
I spoke with [facility] and informed them that the pt is waiting for his wife's medication to be approved before he will take his. The pt has the first month of medication at his house and wont start it until his wife is approved and they can both start at the same time. His wife's medication has been denied twice and she has been sent forms today to fill out to get help from [employer] [facility] the [employer]

## 2014-09-11 NOTE — Telephone Encounter (Signed)
Herman from [employer] [facility] called regarding [name].  His prior auth ran out yesterday.

## 2014-11-20 ENCOUNTER — Encounter: Payer: Self-pay | Admitting: Vascular Surgery

## 2014-11-21 ENCOUNTER — Ambulatory Visit (HOSPITAL_COMMUNITY)
Admission: RE | Admit: 2014-11-21 | Discharge: 2014-11-21 | Disposition: A | Discharge status: Home / Self Care | Payer: Medicare Other | Source: Ambulatory Visit | Attending: Vascular Surgery | Admitting: Vascular Surgery

## 2014-11-21 ENCOUNTER — Encounter: Payer: Self-pay | Admitting: Vascular Surgery

## 2014-11-21 ENCOUNTER — Ambulatory Visit (INDEPENDENT_AMBULATORY_CARE_PROVIDER_SITE_OTHER): Payer: Medicare Other | Admitting: Vascular Surgery

## 2014-11-21 VITALS — BP 143/107 | HR 68 | Ht 72.0 in | Wt 210.3 lb

## 2014-11-21 DIAGNOSIS — I739 Peripheral vascular disease, unspecified: Secondary | ICD-10-CM

## 2014-11-21 DIAGNOSIS — Z48812 Encounter for surgical aftercare following surgery on the circulatory system: Secondary | ICD-10-CM

## 2014-11-21 NOTE — Progress Notes (Signed)
    Established Intermittent Claudication  History of Present Illness  [name] is a 54 y.o. ([birth year]) male who presents for 6 month ABI left LE check. .  The patient's symptoms have not progressed.  The patient's symptoms are: Pain in the calf with ambulation the left after 200 feet.  He denise rest pain or non healing ulcers.  He states the pain is not bad enough to under go surgery for revascularization.  He has a BKA  That has healed well and he is in the process of getting his prothesis adjusted and a new sleeve.  The patient's treatment regimen currently included: maximal medical management and daily Asprin.      The patient's PMH, PSH, SH, FamHx, Med, and Allergies are unchanged from 05/16/2014.  On ROS today: no fever, chills, N/V/D.   Physical Examination  Filed Vitals:   11/21/14 1440  BP: 143/107  Pulse: 68  Height: 6' (1.829 m)  Weight: 210 lb 4.8 oz (95.391 kg)  SpO2: 100%   Body mass index is 28.52 kg/(m^2).  General: A&O x 3, WD  Pulmonary: Sym exp, good air movt, CTAB, no rales, rhonchi, & wheezing  Cardiac: RRR, Nl S1, S2, no Murmurs, rubs or gallops  Vascular: Vessel Right Left  Radial Palpable Palpable  Brachial Palpable Palpable  Carotid Palpable, without bruit Palpable, without bruit  Aorta Not palpable N/A  Femoral Palpable Palpable  Popliteal Not palpable Not palpable  PT notPalpable notPalpable  DP notPalpable notPalpable   Gastrointestinal: soft, NTND  Musculoskeletal: M/S 5/5 throughout *, Extremities without ischemic changes Well healed right BKA stump.  Neurologic: Pain and light touch intact in extremities, Motor exam as listed above  Non-Invasive Vascular Imaging ABI (Date: 11/21/2014)*   L: 0.64   Monophasic    Medical Decision Making  [name] is a 54 y.o. male who presents with:  left leg intermittent claudication without evidence of critical limb ischemia.  S/P left iliac stent and right BKA. At this time he does not  think he wants to pursue revascularization surgery.  He states he can do most of what he wants and all of what he needs.  He will f/u in 6 months for re check ABI on the left LE.  Theda Sers Aryona [facility] PA-C [facility] [place] Office: [phone]  The patient was seen in conjunction with Dr. Oneida Alar today in clinic 11/21/2014, 3:28 PM    History and exam findings as above. Patient has had long-standing left leg claudication. This has been somewhat disabling for him but not really lifestyle limiting. He is doing well with his right below-knee amputation. He will follow-up with repeat ABIs in 6 months. He has been fairly stable over the last few years.  Ruta Hinds, MD [facility] [place] Office: [phone] Pager: [phone]

## 2014-11-21 NOTE — Addendum Note (Signed)
Addended by: Mena Goes on: 11/21/2014 04:02 PM   Modules accepted: Orders

## 2014-12-05 ENCOUNTER — Telehealth: Payer: Self-pay | Admitting: General Practice

## 2014-12-05 DIAGNOSIS — B182 Chronic viral hepatitis C: Secondary | ICD-10-CM

## 2014-12-05 NOTE — Telephone Encounter (Signed)
Patient's wife called stated her husband wanted to know if the patient has to have a hernia repair, will that interfere with him taking Harvoni?

## 2014-12-06 NOTE — Telephone Encounter (Signed)
Further information is needed. I was not aware that he has started his Harvoni. Is he on it now?

## 2014-12-06 NOTE — Telephone Encounter (Signed)
Routing to LSL- she last saw the pt on 10/15/115

## 2014-12-10 NOTE — Telephone Encounter (Signed)
The pt received his first months worth and never took it. He told [facility] that he was waiting for his wife. He was told by [facility] that he needed to continue to get his refills d/t he had gotten approved for harvoni through his insurance and he may not get approved again. Pt is now on his second bottle and has taken half of it. He has not had any problems with it. He is not taking any new medications. He only takes xanax, ASA and his depression medication. He is going for a hernia repair with Dr.Jenkins on 12/25/14 and they are concerned about the anesthesia and his harvoni. pts wife said they asked Dr.Jenkins about it and he didn't know.   I informed her that he was definitely going to need some blood work from [place] and she said that was fine.   What do you need from this patient at this point? Also is there any problems with harvoni and the anesthesia?

## 2014-12-11 NOTE — H&P (Signed)
[name] is an 54 y.o. male.   Chief Complaint: Incisional hernia HPI: Patient is a 54 year old white male who presents with an enlarging incisional hernia at the epigastric site. It is causing discomfort when coughing.  Past Medical History  Diagnosis Date  . Anxiety   . Hepatitis C     no prior treatment  . Asthma   . Depression   . DVT (deep venous thrombosis) 2009    Past Surgical History  Procedure Laterality Date  . Femoral-tibial bypass graft  2009    Right side using non-reversed GSV   By Dr. Oneida Alar  . Femoral-tibial bypass graft  11/24/2007    Right femoral to anterior tibial BPG   by Dr. Oneida Alar  . 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 Aortogram  08/08/2009    for LLE claudication     By Dr. Oneida Alar  . Cholecystectomy    . Bladder tumor excision  8/812  . Hand tendon surgery Left 2013    Left 5th finger  . Colonoscopy with propofol N/A 05/30/2014    NWG:NFAOZH colonic polyp-likely source of hematochezia-removed as described above  . Esophagogastroduodenoscopy (egd) with propofol N/A 05/30/2014    YQM:VHQION and bulbar erosions s/p gastric biopsy. No evidence of portal gastropathy on today's examination.  . Esophageal biopsy N/A 05/30/2014    Procedure: BIOPSY;  Surgeon: [name], MD;  Location: [facility];  Service: Endoscopy;  Laterality: N/A;  . Polypectomy  05/30/2014    Procedure: POLYPECTOMY;  Surgeon: [name], MD;  Location: [facility];  Service: Endoscopy;;    Family History  Problem Relation Age of Onset  . Colon cancer Neg Hx   . Vision loss Mother   . Ulcers Father    Social History:  reports that he has been smoking Cigarettes.  He has a 43.5 pack-year smoking history. He has never used smokeless tobacco. He reports that he does not drink alcohol or use illicit drugs.  Allergies:  Allergies  Allergen Reactions  . Lyrica [Pregabalin] Palpitations    Chest pain and heart palps.  . Neurontin [Gabapentin] Palpitations    Chest pain and  heart palp    No prescriptions prior to admission    No results found for this or any previous visit (from the past 48 hour(s)). No results found.  Review of Systems  Constitutional: Negative.   HENT: Negative.   Eyes: Negative.   Respiratory: Positive for wheezing.   Cardiovascular: Negative.   Gastrointestinal: Positive for abdominal pain.  Genitourinary: Negative.   Musculoskeletal:       Right leg amputee  Skin: Negative.   Psychiatric/Behavioral: The patient is nervous/anxious.     There were no vitals taken for this visit. Physical Exam  Constitutional: He is oriented to person, place, and time. He appears well-developed and well-nourished.  HENT:  Head: Normocephalic and atraumatic.  Neck: Normal range of motion. Neck supple.  Cardiovascular: Normal rate, regular rhythm and normal heart sounds.   Respiratory: Effort normal and breath sounds normal.  No significant wheezing noted.  GI: Soft. Bowel sounds are normal. He exhibits no distension.  Epigastric incisional hernia from previous trocar site, easily reducible  Musculoskeletal:  Right lower extremity amputee  Neurological: He is alert and oriented to person, place, and time.  Skin: Skin is warm and dry.     Assessment/Plan Impression: Incisional hernia Plan: Patient will undergo incisional herniorrhaphy with mesh on 12/25/2014. The risks and benefits  of the procedure including bleeding, infection, mesh use, the possibly recurrence of the hernia were fully explained to the patient, who gave informed consent.  Monice Lundy A 12/11/2014, 7:30 AM

## 2014-12-11 NOTE — Telephone Encounter (Signed)
Patient needs HCV RNA quant, LFTs. The other labs are already scheduled with his pre-op (cbc, met-7).  Please inform the patient he should be taking his Harvoni at the same time each day. Current listed medications are ok, xanxa, celexa, norco, asa.   There should be no issues with anesthesia and Harvoni. Do not skip his Harvoni dose the day of surgery. He should advise hospital staff that he cannot have antacids within four hours of his Harvoni. He can take pepcid $RemoveBef'40mg'ZqzOdMEaKS$  or omeprazole $RemoveBefor'20mg'ELdpipjLuZlX$  at same time of Harvoni (this info is more for if he is admitted).  Needs follow up OV.

## 2014-12-16 NOTE — Patient Instructions (Signed)
[name]  12/16/2014   Your procedure is scheduled on:  12/25/2014  Report to [facility] at  615  AM.  Call this number if you have problems the morning of surgery: [phone]   Remember:   Do not eat food or drink liquids after midnight.   Take these medicines the morning of surgery with A SIP OF WATER:  Xanax, celexa, hydrocodone   Do not wear jewelry, make-up or nail polish.  Do not wear lotions, powders, or perfumes.   Do not shave 48 hours prior to surgery. Men may shave face and neck.  Do not bring valuables to the hospital.  [facility] is not responsible for any belongings or valuables.               Contacts, dentures or bridgework may not be worn into surgery.  Leave suitcase in the car. After surgery it may be brought to your room.  For patients admitted to the hospital, discharge time is determined by your treatment team.               Patients discharged the day of surgery will not be allowed to drive home.  Name and phone number of your driver: family  Special Instructions: Shower using CHG 2 nights before surgery and the night before surgery.  If you shower the day of surgery use CHG.  Use special wash - you have one bottle of CHG for all showers.  You should use approximately 1/3 of the bottle for each shower.   Please read over the following fact sheets that you were given: Pain Booklet, Coughing and Deep Breathing, Surgical Site Infection Prevention, Anesthesia Post-op Instructions and Care and Recovery After Surgery Hernia A hernia occurs when an internal organ pushes out through a weak spot in the abdominal wall. Hernias most commonly occur in the groin and around the navel. Hernias often can be pushed back into place (reduced). Most hernias tend to get worse over time. Some abdominal hernias can get stuck in the opening (irreducible or incarcerated hernia) and cannot be reduced. An irreducible abdominal hernia which is tightly squeezed into the opening is at  risk for impaired blood supply (strangulated hernia). A strangulated hernia is a medical emergency. Because of the risk for an irreducible or strangulated hernia, surgery may be recommended to repair a hernia. CAUSES   Heavy lifting.  Prolonged coughing.  Straining to have a bowel movement.  A cut (incision) made during an abdominal surgery. HOME CARE INSTRUCTIONS   Bed rest is not required. You may continue your normal activities.  Avoid lifting more than 10 pounds (4.5 kg) or straining.  Cough gently. If you are a smoker it is best to stop. Even the best hernia repair can break down with the continual strain of coughing. Even if you do not have your hernia repaired, a cough will continue to aggravate the problem.  Do not wear anything tight over your hernia. Do not try to keep it in with an outside bandage or truss. These can damage abdominal contents if they are trapped within the hernia sac.  Eat a normal diet.  Avoid constipation. Straining over long periods of time will increase hernia size and encourage breakdown of repairs. If you cannot do this with diet alone, stool softeners may be used. SEEK IMMEDIATE MEDICAL CARE IF:   You have a fever.  You develop increasing abdominal pain.  You feel nauseous or vomit.  Your hernia  is stuck outside the abdomen, looks discolored, feels hard, or is tender.  You have any changes in your bowel habits or in the hernia that are unusual for you.  You have increased pain or swelling around the hernia.  You cannot push the hernia back in place by applying gentle pressure while lying down. MAKE SURE YOU:   Understand these instructions.  Will watch your condition.  Will get help right away if you are not doing well or get worse. Document Released: 09/13/2005 Document Revised: 12/06/2011 Document Reviewed: 05/02/2008 [facility] Patient Information 2015 [place], [place]. This information is not intended to replace advice given to you by  your health care provider. Make sure you discuss any questions you have with your health care provider. PATIENT INSTRUCTIONS POST-ANESTHESIA  IMMEDIATELY FOLLOWING SURGERY:  Do not drive or operate machinery for the first twenty four hours after surgery.  Do not make any important decisions for twenty four hours after surgery or while taking narcotic pain medications or sedatives.  If you develop intractable nausea and vomiting or a severe headache please notify your doctor immediately.  FOLLOW-UP:  Please make an appointment with your surgeon as instructed. You do not need to follow up with anesthesia unless specifically instructed to do so.  WOUND CARE INSTRUCTIONS (if applicable):  Keep a dry clean dressing on the anesthesia/puncture wound site if there is drainage.  Once the wound has quit draining you may leave it open to air.  Generally you should leave the bandage intact for twenty four hours unless there is drainage.  If the epidural site drains for more than 36-48 hours please call the anesthesia department.  QUESTIONS?:  Please feel free to call your physician or the hospital operator if you have any questions, and they will be happy to assist you.

## 2014-12-16 NOTE — Telephone Encounter (Signed)
Tried to call- NA 

## 2014-12-17 ENCOUNTER — Encounter (HOSPITAL_COMMUNITY): Payer: Self-pay

## 2014-12-17 ENCOUNTER — Encounter (HOSPITAL_COMMUNITY)
Admission: RE | Admit: 2014-12-17 | Discharge: 2014-12-17 | Disposition: A | Discharge status: Home / Self Care | Payer: Medicare Other | Source: Ambulatory Visit | Attending: General Surgery | Admitting: General Surgery

## 2014-12-17 DIAGNOSIS — K432 Incisional hernia without obstruction or gangrene: Secondary | ICD-10-CM | POA: Diagnosis not present

## 2014-12-17 DIAGNOSIS — Z01812 Encounter for preprocedural laboratory examination: Secondary | ICD-10-CM | POA: Diagnosis present

## 2014-12-17 DIAGNOSIS — B1920 Unspecified viral hepatitis C without hepatic coma: Secondary | ICD-10-CM | POA: Diagnosis not present

## 2014-12-17 LAB — BASIC METABOLIC PANEL
Anion gap: 10 (ref 5–15)
BUN: 13 mg/dL (ref 6–23)
CHLORIDE: 102 mmol/L (ref 96–112)
CO2: 24 mmol/L (ref 19–32)
Calcium: 9.1 mg/dL (ref 8.4–10.5)
Creatinine, Ser: 0.96 mg/dL (ref 0.50–1.35)
GFR calc Af Amer: >90 mL/min (ref >90)
GFR calc non Af Amer: >90 mL/min (ref >90)
Glucose, Bld: 110 mg/dL — ABNORMAL HIGH (ref 70–99)
POTASSIUM: 4.3 mmol/L (ref 3.5–5.1)
SODIUM: 136 mmol/L (ref 135–145)

## 2014-12-17 LAB — CBC WITH DIFFERENTIAL/PLATELET
Basophils Absolute: 0 10*3/uL (ref 0.0–0.1)
Basophils Relative: 0 % (ref 0–1)
EOS PCT: 4 % (ref 0–5)
Eosinophils Absolute: 0.3 10*3/uL (ref 0.0–0.7)
HCT: 47.1 % (ref 39.0–52.0)
Hemoglobin: 15.8 g/dL (ref 13.0–17.0)
LYMPHS ABS: 2.6 10*3/uL (ref 0.7–4.0)
Lymphocytes Relative: 29 % (ref 12–46)
MCH: 31.3 pg (ref 26.0–34.0)
MCHC: 33.5 g/dL (ref 30.0–36.0)
MCV: 93.3 fL (ref 78.0–100.0)
MONOS PCT: 8 % (ref 3–12)
Monocytes Absolute: 0.8 10*3/uL (ref 0.1–1.0)
NEUTROS ABS: 5.5 10*3/uL (ref 1.7–7.7)
Neutrophils Relative %: 59 % (ref 43–77)
Platelets: 338 10*3/uL (ref 150–400)
RBC: 5.05 MIL/uL (ref 4.22–5.81)
RDW: 13.8 % (ref 11.5–15.5)
WBC: 9.2 10*3/uL (ref 4.0–10.5)

## 2014-12-17 LAB — PROTIME-INR
INR: 1.03 (ref 0.00–1.49)
PROTHROMBIN TIME: 13.6 s (ref 11.6–15.2)

## 2014-12-17 NOTE — Telephone Encounter (Signed)
Tried to call- NA 

## 2014-12-17 NOTE — Pre-Procedure Instructions (Signed)
Patient given information to sign up for my chart at home. 

## 2014-12-18 ENCOUNTER — Other Ambulatory Visit: Payer: Self-pay

## 2014-12-18 DIAGNOSIS — B182 Chronic viral hepatitis C: Secondary | ICD-10-CM

## 2014-12-18 NOTE — Addendum Note (Signed)
Addended by: Claudina Lick on: 12/18/2014 04:55 PM   Modules accepted: Orders

## 2014-12-18 NOTE — Telephone Encounter (Signed)
Called pts wife. She is aware of all instructions. Lab order in the mail.   Please schedule ov.

## 2014-12-19 ENCOUNTER — Encounter: Payer: Self-pay | Admitting: Internal Medicine

## 2014-12-19 ENCOUNTER — Other Ambulatory Visit (HOSPITAL_COMMUNITY): Payer: Medicare Other

## 2014-12-19 NOTE — Telephone Encounter (Signed)
APPOINTMENT MADE AND LETTER SENT °

## 2014-12-25 ENCOUNTER — Ambulatory Visit (HOSPITAL_COMMUNITY)
Admission: RE | Admit: 2014-12-25 | Discharge: 2014-12-25 | Disposition: A | Discharge status: Home / Self Care | Payer: Medicare Other | Source: Ambulatory Visit | Attending: General Surgery | Admitting: General Surgery

## 2014-12-25 ENCOUNTER — Ambulatory Visit (HOSPITAL_COMMUNITY): Payer: Medicare Other | Admitting: Anesthesiology

## 2014-12-25 ENCOUNTER — Encounter (HOSPITAL_COMMUNITY)
Admission: RE | Disposition: A | Discharge status: Home / Self Care | Payer: Self-pay | Source: Ambulatory Visit | Attending: General Surgery

## 2014-12-25 ENCOUNTER — Encounter (HOSPITAL_COMMUNITY): Payer: Self-pay | Admitting: Anesthesiology

## 2014-12-25 DIAGNOSIS — F329 Major depressive disorder, single episode, unspecified: Secondary | ICD-10-CM | POA: Insufficient documentation

## 2014-12-25 DIAGNOSIS — Z9049 Acquired absence of other specified parts of digestive tract: Secondary | ICD-10-CM | POA: Insufficient documentation

## 2014-12-25 DIAGNOSIS — B1920 Unspecified viral hepatitis C without hepatic coma: Secondary | ICD-10-CM | POA: Diagnosis not present

## 2014-12-25 DIAGNOSIS — K432 Incisional hernia without obstruction or gangrene: Secondary | ICD-10-CM | POA: Insufficient documentation

## 2014-12-25 DIAGNOSIS — F419 Anxiety disorder, unspecified: Secondary | ICD-10-CM | POA: Insufficient documentation

## 2014-12-25 DIAGNOSIS — J45909 Unspecified asthma, uncomplicated: Secondary | ICD-10-CM | POA: Diagnosis not present

## 2014-12-25 DIAGNOSIS — Z86718 Personal history of other venous thrombosis and embolism: Secondary | ICD-10-CM | POA: Insufficient documentation

## 2014-12-25 DIAGNOSIS — F17210 Nicotine dependence, cigarettes, uncomplicated: Secondary | ICD-10-CM | POA: Insufficient documentation

## 2014-12-25 DIAGNOSIS — Z86010 Personal history of colonic polyps: Secondary | ICD-10-CM | POA: Insufficient documentation

## 2014-12-25 DIAGNOSIS — Z89511 Acquired absence of right leg below knee: Secondary | ICD-10-CM | POA: Diagnosis not present

## 2014-12-25 DIAGNOSIS — Z888 Allergy status to other drugs, medicaments and biological substances status: Secondary | ICD-10-CM | POA: Diagnosis not present

## 2014-12-25 HISTORY — PX: INCISIONAL HERNIA REPAIR: SHX193

## 2014-12-25 HISTORY — PX: INSERTION OF MESH: SHX5868

## 2014-12-25 SURGERY — REPAIR, HERNIA, INCISIONAL
Anesthesia: General | Site: Abdomen

## 2014-12-25 MED ORDER — BUPIVACAINE HCL (PF) 0.5 % IJ SOLN
INTRAMUSCULAR | Status: AC
  Filled 2014-12-25: qty 30

## 2014-12-25 MED ORDER — FENTANYL CITRATE 0.05 MG/ML IJ SOLN
INTRAMUSCULAR | Status: DC | PRN
  Administered 2014-12-25 (×5): 50 ug via INTRAVENOUS

## 2014-12-25 MED ORDER — LIDOCAINE HCL (PF) 1 % IJ SOLN
INTRAMUSCULAR | Status: AC
  Filled 2014-12-25: qty 5

## 2014-12-25 MED ORDER — SODIUM CHLORIDE 0.9 % IR SOLN
Status: DC | PRN
  Administered 2014-12-25: 1000 mL

## 2014-12-25 MED ORDER — ONDANSETRON HCL 4 MG/2ML IJ SOLN: 4.0000 mg | Freq: Once | INTRAMUSCULAR | Status: DC | PRN

## 2014-12-25 MED ORDER — CEFAZOLIN SODIUM-DEXTROSE 2-3 GM-% IV SOLR
2.0000 g | INTRAVENOUS | Status: AC
  Administered 2014-12-25: 2 g via INTRAVENOUS
  Filled 2014-12-25: qty 50

## 2014-12-25 MED ORDER — BUPIVACAINE LIPOSOME 1.3 % IJ SUSP
INTRAMUSCULAR | Status: AC
  Filled 2014-12-25: qty 20

## 2014-12-25 MED ORDER — POVIDONE-IODINE 10 % EX OINT
TOPICAL_OINTMENT | CUTANEOUS | Status: AC
  Filled 2014-12-25: qty 1

## 2014-12-25 MED ORDER — PROPOFOL 10 MG/ML IV BOLUS
INTRAVENOUS | Status: DC | PRN
  Administered 2014-12-25: 150 mg via INTRAVENOUS
  Administered 2014-12-25 (×3): 50 mg via INTRAVENOUS

## 2014-12-25 MED ORDER — METHYLPREDNISOLONE SODIUM SUCC 125 MG IJ SOLR
INTRAMUSCULAR | Status: AC
  Filled 2014-12-25: qty 2

## 2014-12-25 MED ORDER — GLYCOPYRROLATE 0.2 MG/ML IJ SOLN
INTRAMUSCULAR | Status: DC | PRN
  Administered 2014-12-25: .8 mg via INTRAVENOUS

## 2014-12-25 MED ORDER — FENTANYL CITRATE 0.05 MG/ML IJ SOLN: 25.0000 ug | INTRAMUSCULAR | Status: DC | PRN

## 2014-12-25 MED ORDER — PROPOFOL 10 MG/ML IV BOLUS
INTRAVENOUS | Status: AC
  Filled 2014-12-25: qty 20

## 2014-12-25 MED ORDER — NEOSTIGMINE METHYLSULFATE 10 MG/10ML IV SOLN
INTRAVENOUS | Status: DC | PRN
  Administered 2014-12-25: 5 mg via INTRAVENOUS

## 2014-12-25 MED ORDER — ONDANSETRON HCL 4 MG/2ML IJ SOLN
INTRAMUSCULAR | Status: AC
  Filled 2014-12-25: qty 2

## 2014-12-25 MED ORDER — HYDROCODONE-ACETAMINOPHEN 10-325 MG PO TABS: 1.0000 | ORAL_TABLET | ORAL | Status: DC | PRN

## 2014-12-25 MED ORDER — BUPIVACAINE HCL (PF) 0.5 % IJ SOLN
INTRAMUSCULAR | Status: DC | PRN
  Administered 2014-12-25: 7 mL

## 2014-12-25 MED ORDER — ALBUTEROL SULFATE HFA 108 (90 BASE) MCG/ACT IN AERS
INHALATION_SPRAY | RESPIRATORY_TRACT | Status: AC
  Filled 2014-12-25: qty 6.7

## 2014-12-25 MED ORDER — ROCURONIUM BROMIDE 50 MG/5ML IV SOLN
INTRAVENOUS | Status: AC
  Filled 2014-12-25: qty 1

## 2014-12-25 MED ORDER — SUCCINYLCHOLINE CHLORIDE 20 MG/ML IJ SOLN
INTRAMUSCULAR | Status: AC
  Filled 2014-12-25: qty 1

## 2014-12-25 MED ORDER — ROCURONIUM BROMIDE 100 MG/10ML IV SOLN
INTRAVENOUS | Status: DC | PRN
  Administered 2014-12-25: 10 mg via INTRAVENOUS
  Administered 2014-12-25: 30 mg via INTRAVENOUS

## 2014-12-25 MED ORDER — CHLORHEXIDINE GLUCONATE 4 % EX LIQD: 1.0000 "application " | Freq: Once | CUTANEOUS | Status: DC

## 2014-12-25 MED ORDER — POVIDONE-IODINE 10 % OINT PACKET
TOPICAL_OINTMENT | CUTANEOUS | Status: DC | PRN
  Administered 2014-12-25: 1 via TOPICAL

## 2014-12-25 MED ORDER — LIDOCAINE HCL 1 % IJ SOLN
INTRAMUSCULAR | Status: DC | PRN
  Administered 2014-12-25: 40 mg via INTRADERMAL

## 2014-12-25 MED ORDER — MIDAZOLAM HCL 2 MG/2ML IJ SOLN
INTRAMUSCULAR | Status: AC
  Filled 2014-12-25: qty 2

## 2014-12-25 MED ORDER — ONDANSETRON HCL 4 MG/2ML IJ SOLN
4.0000 mg | Freq: Once | INTRAMUSCULAR | Status: AC
  Administered 2014-12-25: 4 mg via INTRAVENOUS

## 2014-12-25 MED ORDER — GLYCOPYRROLATE 0.2 MG/ML IJ SOLN
INTRAMUSCULAR | Status: AC
  Filled 2014-12-25: qty 4

## 2014-12-25 MED ORDER — KETOROLAC TROMETHAMINE 30 MG/ML IJ SOLN
30.0000 mg | Freq: Once | INTRAMUSCULAR | Status: AC
  Administered 2014-12-25: 30 mg via INTRAVENOUS
  Filled 2014-12-25: qty 1

## 2014-12-25 MED ORDER — LACTATED RINGERS IV SOLN
INTRAVENOUS | Status: DC
  Administered 2014-12-25: 07:00:00 via INTRAVENOUS

## 2014-12-25 MED ORDER — MIDAZOLAM HCL 2 MG/2ML IJ SOLN
1.0000 mg | INTRAMUSCULAR | Status: DC | PRN
  Administered 2014-12-25: 2 mg via INTRAVENOUS

## 2014-12-25 MED ORDER — FENTANYL CITRATE 0.05 MG/ML IJ SOLN
INTRAMUSCULAR | Status: AC
  Filled 2014-12-25: qty 5

## 2014-12-25 SURGICAL SUPPLY — 44 items
BAG HAMPER (MISCELLANEOUS) ×3 IMPLANT
CHLORAPREP W/TINT 26ML (MISCELLANEOUS) ×3 IMPLANT
CLOTH BEACON ORANGE TIMEOUT ST (SAFETY) ×3 IMPLANT
COVER LIGHT HANDLE STERIS (MISCELLANEOUS) ×6 IMPLANT
DECANTER SPIKE VIAL GLASS SM (MISCELLANEOUS) ×1 IMPLANT
ELECT REM PT RETURN 9FT ADLT (ELECTROSURGICAL) ×3
ELECTRODE REM PT RTRN 9FT ADLT (ELECTROSURGICAL) ×2 IMPLANT
FORMALIN 10 PREFIL 480ML (MISCELLANEOUS) ×3 IMPLANT
GAUZE SPONGE 4X4 12PLY STRL (GAUZE/BANDAGES/DRESSINGS) ×2 IMPLANT
GLOVE BIOGEL M 7.0 STRL (GLOVE) ×1 IMPLANT
GLOVE BIOGEL PI IND STRL 7.0 (GLOVE) IMPLANT
GLOVE BIOGEL PI INDICATOR 7.0 (GLOVE) ×2
GLOVE ECLIPSE 6.5 STRL STRAW (GLOVE) ×1 IMPLANT
GLOVE EXAM NITRILE LRG STRL (GLOVE) ×1 IMPLANT
GLOVE SURG SS PI 7.5 STRL IVOR (GLOVE) ×3 IMPLANT
GOWN STRL REUS W/ TWL XL LVL3 (GOWN DISPOSABLE) ×2 IMPLANT
GOWN STRL REUS W/TWL LRG LVL3 (GOWN DISPOSABLE) ×6 IMPLANT
GOWN STRL REUS W/TWL XL LVL3 (GOWN DISPOSABLE) ×3
INST SET MAJOR GENERAL (KITS) ×3 IMPLANT
KIT ROOM TURNOVER APOR (KITS) ×3 IMPLANT
MANIFOLD NEPTUNE II (INSTRUMENTS) ×3 IMPLANT
MESH VENTRALEX ST 2.5 CRC MED (Mesh General) ×1 IMPLANT
NDL HYPO 25X1 1.5 SAFETY (NEEDLE) ×2 IMPLANT
NEEDLE HYPO 25X1 1.5 SAFETY (NEEDLE) ×3 IMPLANT
NS IRRIG 1000ML POUR BTL (IV SOLUTION) ×3 IMPLANT
PACK ABDOMINAL MAJOR (CUSTOM PROCEDURE TRAY) ×3 IMPLANT
PAD ARMBOARD 7.5X6 YLW CONV (MISCELLANEOUS) ×3 IMPLANT
SET BASIN LINEN APH (SET/KITS/TRAYS/PACK) ×3 IMPLANT
SPONGE GAUZE 4X4 12PLY (GAUZE/BANDAGES/DRESSINGS) ×1 IMPLANT
STAPLER VISISTAT (STAPLE) ×1 IMPLANT
SUT ETHIBOND NAB MO 7 #0 18IN (SUTURE) ×1 IMPLANT
SUT NOVA NAB GS-21 1 T12 (SUTURE) IMPLANT
SUT NOVA NAB GS-22 2 2-0 T-19 (SUTURE) IMPLANT
SUT NOVA NAB GS-26 0 60 (SUTURE) IMPLANT
SUT PROLENE 0 CT 1 CR/8 (SUTURE) IMPLANT
SUT SILK 2 0 (SUTURE)
SUT SILK 2-0 18XBRD TIE 12 (SUTURE) IMPLANT
SUT VIC AB 2-0 CT1 27 (SUTURE)
SUT VIC AB 2-0 CT1 TAPERPNT 27 (SUTURE) ×2 IMPLANT
SUT VIC AB 3-0 SH 27 (SUTURE) ×3
SUT VIC AB 3-0 SH 27X BRD (SUTURE) ×2 IMPLANT
SUT VIC AB 4-0 PS2 27 (SUTURE) IMPLANT
SYR 20CC LL (SYRINGE) ×3 IMPLANT
TAPE CLOTH SURG 4X10 WHT LF (GAUZE/BANDAGES/DRESSINGS) ×1 IMPLANT

## 2014-12-25 NOTE — Progress Notes (Addendum)
SCD order discontinued due to abnormal left ultrasound

## 2014-12-25 NOTE — Op Note (Signed)
Patient:  [name]  DOB:  1961-06-13  MRN:  [id number]   Preop Diagnosis:  Incisional hernia  Postop Diagnosis:  Same  Procedure:  Incisional herniorrhaphy with mesh  Surgeon:  Aviva Signs, M.D.  Anes:  Gen. endotracheal  Indications:  Patient is a 53 year old white male status post a laparoscopic cholecystectomy in the past who now presents with an incisional hernia at the epigastric trocar site. The risks and benefits of the procedure including bleeding, infection, mesh use, and the possibility of recurrence of the hernia were fully explained to the patient, who gave informed consent.  Procedure note:  The patient was placed the supine position. After induction of general endotracheal anesthesia, the abdomen was prepped and draped using usual sterile technique with ChloraPrep. Surgical site confirmation was performed.  An incision was made to the previous epigastric incision site. The dissection was taken down to the fascia. A 3 cm hernia was found. The properitoneal fat was freed away from the fascia and a 6.4 cm Bard ventralax ST patch was then inserted. It was secured to the fascia with its tabs using 0 Ethibond interrupted sutures. The overlying fascia was reapproximated using 0 Ethibond interrupted sutures. The subcutaneous layer was reapproximated using 3-0 Vicryl interrupted sutures. 0.5% Sensorcaine was instilled the surrounding wound. The skin was closed using staples. Betadine ointment and dry sterile dressings were applied.  All tape and needle counts were correct the end of the procedure. Patient was extubated in the operating room and transferred to PACU in stable condition.  Complications:  None  EBL:  Minimal  Specimen:  None

## 2014-12-25 NOTE — Anesthesia Procedure Notes (Signed)
Procedure Name: Intubation Date/Time: 12/25/2014 7:40 AM Performed by: Tressie Stalker E Pre-anesthesia Checklist: Patient identified, Patient being monitored, Timeout performed, Emergency Drugs available and Suction available Patient Re-evaluated:Patient Re-evaluated prior to inductionOxygen Delivery Method: Circle System Utilized Preoxygenation: Pre-oxygenation with 100% oxygen Intubation Type: IV induction Ventilation: Mask ventilation without difficulty Laryngoscope Size: Mac and 3 Grade View: Grade I Tube type: Oral Tube size: 7.0 mm Number of attempts: 1 Airway Equipment and Method: Stylet Placement Confirmation: ETT inserted through vocal cords under direct vision,  positive ETCO2 and breath sounds checked- equal and bilateral Secured at: 22 cm Tube secured with: Tape Dental Injury: Teeth and Oropharynx as per pre-operative assessment  Comments: Questionable bronchospasm on induction, very tight and wheezy.  Treated with albuterol and solumedrol.  Wheezing decreased, but airway very reactive.

## 2014-12-25 NOTE — Addendum Note (Signed)
Addendum  created 12/25/14 0900 by Ollen Bowl, CRNA   Modules edited: Anesthesia Attestations

## 2014-12-25 NOTE — Discharge Instructions (Signed)
Open Hernia Repair, Care After °Refer to this sheet in the next few weeks. These instructions provide you with information on caring for yourself after your procedure. Your health care provider may also give you more specific instructions. Your treatment has been planned according to current medical practices, but problems sometimes occur. Call your health care provider if you have any problems or questions after your procedure. °WHAT TO EXPECT AFTER THE PROCEDURE °After your procedure, it is typical to have the following: °· Pain in your abdomen, especially along your incision. You will be given pain medicines to control the pain. °· Constipation. You may be given a stool softener to help prevent this. °HOME CARE INSTRUCTIONS  °· Only take over-the-counter or prescription medicines as directed by your health care provider. °· Keep the wound dry and clean. You may wash the wound gently with soap and water 48 hours after surgery. Gently blot or dab the wound dry. Do not take baths, use swimming pools, or use hot tubs for 10 days or until your health care provider approves. °· Change bandages (dressings) as directed by your health care provider. °· Continue your normal diet as directed by your health care provider. Eat plenty of fruits and vegetables to help prevent constipation. °· Drink enough fluids to keep your urine clear or pale yellow. This also helps prevent constipation. °· Do not drive until your health care provider says it is okay. °· Do not lift anything heavier than 10 pounds (4.5 kg) or play contact sports for 4 weeks or until your health care provider approves. °· Follow up with your health care provider as directed. Ask your health care provider when to make an appointment to have your stitches (sutures) or staples removed. °SEEK MEDICAL CARE IF:  °· You have increased bleeding coming from the incision site. °· You have blood in your stool. °· You have increasing pain in the wound. °· You see redness  or swelling in the wound. °· You have fluid (pus) coming from the wound. °· You have a fever. °· You notice a bad smell coming from the wound or dressing. °SEEK IMMEDIATE MEDICAL CARE IF:  °· You develop a rash. °· You have chest pain or shortness of breath. °· You feel lightheaded or feel faint. °Document Released: 04/02/2005 Document Revised: 07/04/2013 Document Reviewed: 04/25/2013 °[employer]® Patient Information ©2015 [employer]. This information is not intended to replace advice given to you by your health care provider. Make sure you discuss any questions you have with your health care provider. ° °

## 2014-12-25 NOTE — Anesthesia Postprocedure Evaluation (Signed)
  Anesthesia Post-op Note  Patient: [name]  Procedure(s) Performed: Procedure(s): HERNIA REPAIR INCISIONAL WITH MESH (N/A) INSERTION OF MESH (N/A)  Patient Location: PACU  Anesthesia Type:General  Level of Consciousness: awake, alert  and oriented  Airway and Oxygen Therapy: Patient Spontanous Breathing and Patient connected to face mask oxygen  Post-op Pain: none  Post-op Assessment: Post-op Vital signs reviewed, Patient's Cardiovascular Status Stable, Respiratory Function Stable, Patent Airway and No signs of Nausea or vomiting  Post-op Vital Signs: Reviewed and stable  Last Vitals:  Filed Vitals:   12/25/14 0720  BP: 110/71  Pulse:   Temp:   Resp: 27    Complications: No apparent anesthesia complications

## 2014-12-25 NOTE — Transfer of Care (Signed)
Immediate Anesthesia Transfer of Care Note  Patient: [name]  Procedure(s) Performed: Procedure(s): HERNIA REPAIR INCISIONAL WITH MESH (N/A) INSERTION OF MESH (N/A)  Patient Location: PACU  Anesthesia Type:General  Level of Consciousness: awake, alert  and oriented  Airway & Oxygen Therapy: Patient Spontanous Breathing and Patient connected to face mask oxygen  Post-op Assessment: Report given to RN  Post vital signs: Reviewed and stable  Last Vitals:  Filed Vitals:   12/25/14 0720  BP: 110/71  Pulse:   Temp:   Resp: 27    Complications: No apparent anesthesia complications

## 2014-12-25 NOTE — Anesthesia Preprocedure Evaluation (Signed)
Anesthesia Evaluation  Patient identified by MRN, date of birth, ID band Patient awake    Reviewed: Allergy & Precautions, H&P , NPO status , Patient's Chart, lab work & pertinent test results  Airway Mallampati: II  TM Distance: >3 FB     Dental  (+) Edentulous Upper, Edentulous Lower   Pulmonary shortness of breath and with exertion, asthma , COPDCurrent Smoker,  breath sounds clear to auscultation        Cardiovascular + Peripheral Vascular Disease (fem-tibial bypass) and DVT Rhythm:Regular Rate:Normal     Neuro/Psych PSYCHIATRIC DISORDERS Anxiety Depression    GI/Hepatic (+) Hepatitis -, C  Endo/Other    Renal/GU      Musculoskeletal   Abdominal   Peds  Hematology   Anesthesia Other Findings   Reproductive/Obstetrics                             Anesthesia Physical Anesthesia Plan  ASA: III  Anesthesia Plan: General   Post-op Pain Management:    Induction: Intravenous  Airway Management Planned: Oral ETT  Additional Equipment:   Intra-op Plan:   Post-operative Plan: Extubation in OR  Informed Consent: I have reviewed the patients History and Physical, chart, labs and discussed the procedure including the risks, benefits and alternatives for the proposed anesthesia with the patient or authorized representative who has indicated his/her understanding and acceptance.     Plan Discussed with:   Anesthesia Plan Comments:         Anesthesia Quick Evaluation

## 2014-12-25 NOTE — Interval H&P Note (Signed)
History and Physical Interval Note:  12/25/2014 7:13 AM  [name]  has presented today for surgery, with the diagnosis of incisional hernia  The various methods of treatment have been discussed with the patient and family. After consideration of risks, benefits and other options for treatment, the patient has consented to  Procedure(s): HERNIA REPAIR INCISIONAL WITH MESH (N/A) as a surgical intervention .  The patient's history has been reviewed, patient examined, no change in status, stable for surgery.  I have reviewed the patient's chart and labs.  Questions were answered to the patient's satisfaction.     Aviva Signs A

## 2014-12-26 ENCOUNTER — Encounter (HOSPITAL_COMMUNITY): Payer: Self-pay | Admitting: General Surgery

## 2015-01-13 ENCOUNTER — Encounter: Payer: Self-pay | Admitting: Internal Medicine

## 2015-01-13 ENCOUNTER — Ambulatory Visit: Payer: Medicare Other | Admitting: Gastroenterology

## 2015-02-04 ENCOUNTER — Encounter: Payer: Self-pay | Admitting: Gastroenterology

## 2015-02-04 ENCOUNTER — Encounter: Payer: Self-pay | Admitting: Internal Medicine

## 2015-02-04 ENCOUNTER — Ambulatory Visit (INDEPENDENT_AMBULATORY_CARE_PROVIDER_SITE_OTHER): Payer: Medicare Other | Admitting: Gastroenterology

## 2015-02-04 VITALS — BP 109/72 | HR 77 | Temp 97.3°F | Ht 72.0 in | Wt 203.0 lb

## 2015-02-04 DIAGNOSIS — B182 Chronic viral hepatitis C: Secondary | ICD-10-CM

## 2015-02-04 NOTE — Patient Instructions (Signed)
We will get the blood work from the lab and call you with the results.  I would like to see you back in 2 months along with your wife.

## 2015-02-06 ENCOUNTER — Ambulatory Visit: Payer: Medicare Other | Admitting: Gastroenterology

## 2015-02-12 ENCOUNTER — Telehealth: Payer: Self-pay | Admitting: Gastroenterology

## 2015-02-12 NOTE — Progress Notes (Signed)
Referring Provider: Redmond School, MD Primary Care Physician:  Glo Herring., MD  Primary GI: Dr. Gala Romney   Chief Complaint  Patient presents with  . Follow-up    HPI:   [name] is a 54 y.o. male presenting today with a history of chronic Hep C, genotype 1a, Metavir score 4 on elastography, completing treatment of Harvoni recently. He started Harvoni without letting us know, so we have not followed him per our protocol. Presents with his wife today for follow-up. Wife on month 1 of Harvoni with 2 months left. Patient is without any GI complaints. Up-to-date on EGD for screening of varices . Needs colonoscopy in 2018 due to colonic adenomas. Recently completed blood work April 2016 with normal LFTs and Hep C log 1.079, previously 6.66. Will need to have this rechecked in 2 months.   Past Medical History  Diagnosis Date  . Anxiety   . Hepatitis C     no prior treatment  . Asthma   . Depression   . DVT (deep venous thrombosis) 2009    Past Surgical History  Procedure Laterality Date  . Femoral-tibial bypass graft  2009    Right side using non-reversed GSV   By Dr. Oneida Alar  . Femoral-tibial bypass graft  11/24/2007    Right femoral to anterior tibial BPG   by Dr. Oneida Alar  . 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 Aortogram  08/08/2009    for LLE claudication     By Dr. Oneida Alar  . Cholecystectomy    . Bladder tumor excision  8/812  . Hand tendon surgery Left 2013    Left 5th finger  . Colonoscopy with propofol N/A 05/30/2014    SLH:TDSKAJ colonic polyp-likely source of hematochezia-removed as described above  . Esophagogastroduodenoscopy (egd) with propofol N/A 05/30/2014    GOT:LXBWIO and bulbar erosions s/p gastric biopsy. No evidence of portal gastropathy on today's examination.  . Esophageal biopsy N/A 05/30/2014    Procedure: BIOPSY;  Surgeon: [name], MD;  Location: [facility];  Service: Endoscopy;  Laterality: N/A;  . Polypectomy  05/30/2014    Procedure:  POLYPECTOMY;  Surgeon: [name], MD;  Location: [facility];  Service: Endoscopy;;  . Incisional hernia repair N/A 12/25/2014    Procedure: HERNIA REPAIR INCISIONAL WITH MESH;  Surgeon: [name], MD;  Location: [facility];  Service: General;  Laterality: N/A;  . Insertion of mesh N/A 12/25/2014    Procedure: INSERTION OF MESH;  Surgeon: [name], MD;  Location: [facility];  Service: General;  Laterality: N/A;    Current Outpatient Prescriptions  Medication Sig Dispense Refill  . alprazolam (XANAX) 2 MG tablet Take 2 mg by mouth 4 (four) times daily as needed for anxiety.     [name] aspirin 81 MG EC tablet Take 81 mg by mouth daily.      . citalopram (CELEXA) 40 MG tablet Take 40 mg by mouth daily.  11  . Doxylamine Succinate, Sleep, (SLEEP AID PO) Take 2 tablets by mouth daily as needed (sleep).    [name] HYDROcodone-acetaminophen (NORCO) 10-325 MG per tablet Take 1 tablet by mouth every 4 (four) hours as needed for moderate pain. pain 50 tablet 0   No current facility-administered medications for this visit.    Allergies as of 02/04/2015 - Review Complete 02/04/2015  Allergen Reaction Noted  . Lyrica [pregabalin] Palpitations 02/12/2011  . Neurontin [gabapentin] Palpitations 02/12/2011    Family History  Problem Relation Age of Onset  . Colon cancer Neg Hx   . Vision loss Mother   . Ulcers Father     History   Social History  . Marital Status: Married    Spouse Name: N/A  . Number of Children: N/A  . Years of Education: N/A   Social History Main Topics  . Smoking status: Current Every Day Smoker -- 1.50 packs/day for 29 years    Types: Cigarettes  . Smokeless tobacco: Never Used     Comment: cigarettes  . Alcohol Use: No     Comment: Patient quit drinking about a year ago; history of heavy ETOH use  . Drug Use: No     Comment: marijuana-last used 12/16/2014  . Sexual Activity: Not on file   Other Topics Concern  . None   Social History Narrative    Review of  Systems: Negative unless mentioned in HPI  Physical Exam: BP 109/72 mmHg  Pulse 77  Temp(Src) 97.3 F (36.3 C)  Ht 6' (1.829 m)  Wt 203 lb (92.08 kg)  BMI 27.53 kg/m2 General:   Alert and oriented. No distress noted. Pleasant and cooperative.  Head:  Normocephalic and atraumatic. Eyes:  Conjuctiva clear without scleral icterus. Mouth:  Oral mucosa pink and moist. Good dentition. No lesions. Abdomen:  +BS, soft, non-tender and non-distended. No rebound or guarding. No HSM or masses noted. Extremities:  Right AKA with prosthesis Neurologic:  Alert and  oriented x4;  grossly normal neurologically. Skin:  Intact without significant lesions or rashes. Psych:  Alert and cooperative. Normal mood and affect.

## 2015-02-12 NOTE — Assessment & Plan Note (Signed)
Completed Harvoni treatment. Unfortunately, he started the treatment on his own without notifying our practice, so he has not been followed with serial labs or close monitoring. However, no GI complaints today, and most recent HCV RNA significantly decreased to just above detectable range. Will repeat this in 2 months with hopeful virologic response at that time. Return in 2 months and will arrange routine [place] due to history of F4 fibrosis.

## 2015-02-12 NOTE — Progress Notes (Signed)
cc'ed to pcp °

## 2015-02-12 NOTE — Telephone Encounter (Signed)
Outside labs reviewed. LFTs normal. Hep C RNA still detectable but just above cut-off. Needs to be rechecked in 2 months, and hopefully this will be normal then. Will recheck at next appt.

## 2015-02-12 NOTE — Telephone Encounter (Signed)
Pt is aware. Please schedule ov.  

## 2015-02-13 NOTE — Telephone Encounter (Signed)
PATIENT HAS FU OV 04/08/15

## 2015-04-08 ENCOUNTER — Ambulatory Visit (INDEPENDENT_AMBULATORY_CARE_PROVIDER_SITE_OTHER): Payer: Medicare Other | Admitting: Gastroenterology

## 2015-04-08 ENCOUNTER — Encounter: Payer: Self-pay | Admitting: Gastroenterology

## 2015-04-08 VITALS — BP 103/65 | HR 71 | Temp 98.1°F | Ht 73.0 in | Wt 211.2 lb

## 2015-04-08 DIAGNOSIS — B182 Chronic viral hepatitis C: Secondary | ICD-10-CM

## 2015-04-08 NOTE — Assessment & Plan Note (Signed)
54 year old male completing Harvoni treatment approximately 3 months ago, now due for HCV RNA to document successful response. Also due for elastography, prior score of Metavir 4. Up-to-date on variceal screening, screening again in 2018. Doing well. Return in 6 months.

## 2015-04-08 NOTE — Progress Notes (Signed)
Referring Provider: Redmond School, MD Primary Care Physician:  Glo Herring., MD  Primary GI: Dr. Gala Romney   Chief Complaint  Patient presents with  . Hepatitis C    HPI:   [name] is a 54 y.o. male presenting today with a history of chronic Hep C, genotype 1a, Metavir score 4 on elastography, completing treatment of Harvoni recently. He started Harvoni without letting us know, so we have not followed him per our protocol. Up-to-date on EGD for screening of varices . Needs colonoscopy in 2018 due to colonic adenomas. Recently completed blood work April 2016 with normal LFTs and Hep C log 1.079, previously 6.66. Will need to have this rechecked now.   Denies abdominal pain, N/V, constipation, diarrhea, rectal bleeding. Excited about completing Harvoni. No complaints today.   Past Medical History  Diagnosis Date  . Anxiety   . Hepatitis C     no prior treatment  . Asthma   . Depression   . DVT (deep venous thrombosis) 2009    Past Surgical History  Procedure Laterality Date  . Femoral-tibial bypass graft  2009    Right side using non-reversed GSV   By Dr. Oneida Alar  . Femoral-tibial bypass graft  11/24/2007    Right femoral to anterior tibial BPG   by Dr. Oneida Alar  . 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 Aortogram  08/08/2009    for LLE claudication     By Dr. Oneida Alar  . Cholecystectomy    . Bladder tumor excision  8/812  . Hand tendon surgery Left 2013    Left 5th finger  . Colonoscopy with propofol N/A 05/30/2014    [id number] colonic polyp-likely source of hematochezia-removed as described above  . Esophagogastroduodenoscopy (egd) with propofol N/A 05/30/2014    [id number] and bulbar erosions s/p gastric biopsy. No evidence of portal gastropathy on today's examination.  . Esophageal biopsy N/A 05/30/2014    Procedure: BIOPSY;  Surgeon: [name], MD;  Location: [facility];  Service: Endoscopy;  Laterality: N/A;  . Polypectomy  05/30/2014    Procedure:  POLYPECTOMY;  Surgeon: [name], MD;  Location: [facility];  Service: Endoscopy;;  . Incisional hernia repair N/A 12/25/2014    Procedure: HERNIA REPAIR INCISIONAL WITH MESH;  Surgeon: [name], MD;  Location: [facility];  Service: General;  Laterality: N/A;  . Insertion of mesh N/A 12/25/2014    Procedure: INSERTION OF MESH;  Surgeon: [name], MD;  Location: [facility];  Service: General;  Laterality: N/A;    Current Outpatient Prescriptions  Medication Sig Dispense Refill  . alprazolam (XANAX) 2 MG tablet Take 2 mg by mouth 4 (four) times daily as needed for anxiety.     [name] aspirin 81 MG EC tablet Take 81 mg by mouth daily.      . citalopram (CELEXA) 40 MG tablet Take 40 mg by mouth daily.  11  . Doxylamine Succinate, Sleep, (SLEEP AID PO) Take 2 tablets by mouth daily as needed (sleep).    [name] HYDROcodone-acetaminophen (NORCO) 10-325 MG per tablet Take 1 tablet by mouth every 4 (four) hours as needed for moderate pain. pain 50 tablet 0   No current facility-administered medications for this visit.    Allergies as of 04/08/2015 - Review Complete 04/08/2015  Allergen Reaction Noted  . Lyrica [pregabalin] Palpitations 02/12/2011  . Neurontin [gabapentin] Palpitations 02/12/2011    Family History  Problem Relation Age of Onset  .  Colon cancer Neg Hx   . Vision loss Mother   . Ulcers Father     History   Social History  . Marital Status: Married    Spouse Name: N/A  . Number of Children: N/A  . Years of Education: N/A   Social History Main Topics  . Smoking status: Current Every Day Smoker -- 1.50 packs/day for 29 years    Types: Cigarettes  . Smokeless tobacco: Never Used     Comment: cigarettes  . Alcohol Use: No     Comment: Patient quit drinking about a year ago; history of heavy ETOH use  . Drug Use: No     Comment: marijuana-last used 12/16/2014  . Sexual Activity: Not on file   Other Topics Concern  . None   Social History Narrative    Review of  Systems: As mentioned in HPI  Physical Exam: BP 103/65 mmHg  Pulse 71  Temp(Src) 98.1 F (36.7 C) (Oral)  Ht '6\' 1"'$  (1.854 m)  Wt 211 lb 3.2 oz (95.8 kg)  BMI 27.87 kg/m2 General:   Alert and oriented. No distress noted. Pleasant and cooperative.  Head:  Normocephalic and atraumatic. Eyes:  Conjuctiva clear without scleral icterus. Mouth:  Oral mucosa pink and moist. Poor dentition Abdomen:  +BS, soft, non-tender and non-distended. No rebound or guarding. No HSM or masses noted. Msk:  Symmetrical without gross deformities. Normal posture. Extremities:  Right AKA with prosthesis Neurologic:  Alert and  oriented x4;  grossly normal neurologically. Psych:  Alert and cooperative. Normal mood and affect.

## 2015-04-08 NOTE — Patient Instructions (Signed)
You are due for an ultrasound of your liver. We can call you to set this up.  Please have blood work done. Let us know when you have completed this so we may request it.  We will see you in 6 months!

## 2015-04-08 NOTE — Progress Notes (Signed)
cc'ed to pcp °

## 2015-04-09 ENCOUNTER — Other Ambulatory Visit: Payer: Self-pay

## 2015-04-09 DIAGNOSIS — K7460 Unspecified cirrhosis of liver: Secondary | ICD-10-CM

## 2015-04-15 ENCOUNTER — Other Ambulatory Visit (HOSPITAL_COMMUNITY): Payer: Medicare Other

## 2015-04-22 ENCOUNTER — Ambulatory Visit (HOSPITAL_COMMUNITY): Payer: Medicare Other

## 2015-05-22 ENCOUNTER — Encounter (HOSPITAL_COMMUNITY): Payer: Medicare Other

## 2015-05-22 ENCOUNTER — Ambulatory Visit: Payer: Medicare Other | Admitting: Vascular Surgery

## 2015-06-12 ENCOUNTER — Ambulatory Visit: Payer: Medicare Other | Admitting: Vascular Surgery

## 2015-06-12 ENCOUNTER — Encounter (HOSPITAL_COMMUNITY): Payer: Medicare Other

## 2015-07-01 ENCOUNTER — Encounter: Payer: Self-pay | Admitting: Vascular Surgery

## 2015-07-03 ENCOUNTER — Ambulatory Visit (HOSPITAL_COMMUNITY)
Admission: RE | Admit: 2015-07-03 | Discharge: 2015-07-03 | Disposition: A | Discharge status: Home / Self Care | Payer: Medicare Other | Source: Ambulatory Visit | Attending: Vascular Surgery | Admitting: Vascular Surgery

## 2015-07-03 ENCOUNTER — Encounter: Payer: Self-pay | Admitting: Vascular Surgery

## 2015-07-03 ENCOUNTER — Ambulatory Visit (INDEPENDENT_AMBULATORY_CARE_PROVIDER_SITE_OTHER): Payer: Medicare Other | Admitting: Vascular Surgery

## 2015-07-03 VITALS — BP 110/73 | HR 58 | Temp 97.8°F | Resp 14 | Ht 73.0 in | Wt 220.0 lb

## 2015-07-03 DIAGNOSIS — I739 Peripheral vascular disease, unspecified: Secondary | ICD-10-CM

## 2015-07-03 DIAGNOSIS — Z48812 Encounter for surgical aftercare following surgery on the circulatory system: Secondary | ICD-10-CM

## 2015-07-03 NOTE — Progress Notes (Signed)
     History of Present Illness   Patient is a 54 year old male who returns for follow-up today. He has had known peripheral arterial disease in his left lower extremity for several years with no decline. 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 He is walking well on a prosthetic leg. Unfortunately he continues to smoke.     he has claudication of develops in his left leg after walking approximately 30-40 yards. He is on daily aspirin. He denies rest pain. He has no history of nonhealing wounds.  Past Medical History  Diagnosis Date  . Anxiety   . Hepatitis C     no prior treatment  . Asthma   . Depression   . DVT (deep venous thrombosis) ([place]) 2009        Current Outpatient Prescriptions on File Prior to Visit  Medication Sig Dispense Refill  . alprazolam (XANAX) 2 MG tablet Take 2 mg by mouth 4 (four) times daily as needed for anxiety.     [name] aspirin 81 MG EC tablet Take 81 mg by mouth daily.      . citalopram (CELEXA) 40 MG tablet Take 40 mg by mouth daily.  11  . HYDROcodone-acetaminophen (NORCO) 10-325 MG per tablet Take 1 tablet by mouth every 4 (four) hours as needed for moderate pain. pain 50 tablet 0  . Doxylamine Succinate, Sleep, (SLEEP AID PO) Take 2 tablets by mouth daily as needed (sleep).     No current facility-administered medications on file prior to visit.           Review of systems: He denies chest pain. He denies shortness of breath.   Physical Examination    Filed Vitals:   07/03/15 1337  BP: 110/73  Pulse: 58  Temp: 97.8 F (36.6 C)  TempSrc: Oral  Resp: 14  Height: '6\' 1"'$  (1.854 m)  Weight: 220 lb (99.791 kg)  SpO2: 98%    General: A&O x 3, WD  Pulmonary: Sym exp, good air movt, CTAB, no rales, rhonchi, & wheezing  Cardiac: RRR, Nl S1, S2, no Murmurs, rubs or gallops Neck: No carotid bruits  Extremities: 2+ left femoral pulse absent popliteal and  pedal pulses no ulcers on the foot   Data: Patient left leg ABI today which was 0.5. This is similar to his ABIs over the last few years.  Assessment: Patient has had long-standing left leg claudication. This has been somewhat disabling for him but not really lifestyle limiting. He is doing well with his right below-knee amputation. He will follow-up with repeat ABIs in one year  Ruta Hinds, MD [facility]: [phone] Pager: [phone]

## 2015-08-05 ENCOUNTER — Telehealth: Payer: Self-pay

## 2015-08-05 NOTE — Telephone Encounter (Signed)
Received lab results from Cecil from [facility] called and said the labs are a combination of labs ordered by [place] and Dr.Fusco. She didn't want [facility] to be surprised by all the extra lab results that she didn't order. Labs on [facility] desk.

## 2015-08-06 NOTE — Telephone Encounter (Signed)
Reviewed outside labs dated Nov 2016. Hgb 15.9. Platelets 327. BUN 14, Cr 1.09. NEGATIVE HCV PCR RNA!!!!!!   Needs to complete elastography. He is on the recall list for January 2017.

## 2015-08-07 NOTE — Telephone Encounter (Signed)
Pt is aware. He needs U/S now instead of in January. Please schedule.

## 2015-08-07 NOTE — Telephone Encounter (Signed)
Tried to call pt- NA- LMOM 

## 2015-08-07 NOTE — Telephone Encounter (Signed)
Pt is aware of [place] appt. Pt is set for 08/11/2015 @ 10:45am

## 2015-08-07 NOTE — Telephone Encounter (Signed)
Pt is set up for [place] on 08/11/15 @ 1045. Pt's wife is aware

## 2015-08-11 ENCOUNTER — Ambulatory Visit (HOSPITAL_COMMUNITY): Payer: Medicare Other

## 2015-09-16 ENCOUNTER — Encounter: Payer: Self-pay | Admitting: Internal Medicine

## 2015-10-03 ENCOUNTER — Ambulatory Visit (HOSPITAL_COMMUNITY)
Admission: RE | Admit: 2015-10-03 | Discharge: 2015-10-03 | Disposition: A | Discharge status: Home / Self Care | Payer: Medicare Other | Source: Ambulatory Visit | Attending: Gastroenterology | Admitting: Gastroenterology

## 2015-10-03 DIAGNOSIS — K7460 Unspecified cirrhosis of liver: Secondary | ICD-10-CM

## 2015-10-03 DIAGNOSIS — B182 Chronic viral hepatitis C: Secondary | ICD-10-CM | POA: Insufficient documentation

## 2015-10-06 NOTE — Progress Notes (Signed)
Quick Note:  Metavir F3/F4, similar to prior study. Repeat US in 6 months. Elastography in 1 year. ______

## 2015-10-10 NOTE — Progress Notes (Signed)
ON RECALL  °

## 2015-12-10 ENCOUNTER — Ambulatory Visit: Payer: Medicare Other | Admitting: Gastroenterology

## 2015-12-18 ENCOUNTER — Telehealth: Payer: Self-pay

## 2015-12-18 DIAGNOSIS — M79605 Pain in left leg: Secondary | ICD-10-CM

## 2015-12-18 DIAGNOSIS — I70219 Atherosclerosis of native arteries of extremities with intermittent claudication, unspecified extremity: Secondary | ICD-10-CM

## 2015-12-18 NOTE — Telephone Encounter (Signed)
LVM for patient with appt dates/times,dpm

## 2015-12-18 NOTE — Telephone Encounter (Signed)
Phone call from pt.  Stated "I'm having problems with my other leg."  Reported recently walking on a walking track and feeling a "tightening of the left calf."  Reported that he felt his "knee buckle and the left leg would try to give out."  Reported he has been having problems for awhile, but just thought his calf muscle would loosen up.  Denied any swelling in the left LE.  Denied any redness/inflammation.  Denied any open sores.  Questioned about rest pain.  Reported he gets "some pain at rest."  Described a sensation in toes "like there is something running through my toes when I'm laying down."  Stated his toes feel like they're "on fire", sometimes.  Also, reported the left heel to lower calf feels painful to touch at times.  Reported redness of left great toe; denied any open sore or drainage of this area.  Advised will schedule pt. For appt. Next week.  Advised to go to the ER over weekend if symptoms worsen.  Verb. Understanding.

## 2015-12-22 ENCOUNTER — Ambulatory Visit (HOSPITAL_COMMUNITY)
Admission: RE | Admit: 2015-12-22 | Discharge: 2015-12-22 | Disposition: A | Discharge status: Home / Self Care | Payer: Medicare Other | Source: Ambulatory Visit | Attending: Vascular Surgery | Admitting: Vascular Surgery

## 2015-12-22 ENCOUNTER — Encounter: Payer: Self-pay | Admitting: Family

## 2015-12-22 DIAGNOSIS — R938 Abnormal findings on diagnostic imaging of other specified body structures: Secondary | ICD-10-CM | POA: Insufficient documentation

## 2015-12-22 DIAGNOSIS — I739 Peripheral vascular disease, unspecified: Secondary | ICD-10-CM | POA: Insufficient documentation

## 2015-12-22 DIAGNOSIS — F419 Anxiety disorder, unspecified: Secondary | ICD-10-CM | POA: Diagnosis not present

## 2015-12-22 DIAGNOSIS — F329 Major depressive disorder, single episode, unspecified: Secondary | ICD-10-CM | POA: Insufficient documentation

## 2015-12-22 DIAGNOSIS — M79605 Pain in left leg: Secondary | ICD-10-CM | POA: Insufficient documentation

## 2015-12-22 DIAGNOSIS — I70219 Atherosclerosis of native arteries of extremities with intermittent claudication, unspecified extremity: Secondary | ICD-10-CM

## 2015-12-24 ENCOUNTER — Encounter: Payer: Self-pay | Admitting: Family

## 2015-12-25 ENCOUNTER — Ambulatory Visit (INDEPENDENT_AMBULATORY_CARE_PROVIDER_SITE_OTHER): Payer: Medicare Other | Admitting: Family

## 2015-12-25 ENCOUNTER — Other Ambulatory Visit: Payer: Self-pay

## 2015-12-25 ENCOUNTER — Encounter: Payer: Self-pay | Admitting: Family

## 2015-12-25 VITALS — BP 137/87 | HR 90 | Ht 73.0 in | Wt 227.0 lb

## 2015-12-25 DIAGNOSIS — I739 Peripheral vascular disease, unspecified: Secondary | ICD-10-CM

## 2015-12-25 DIAGNOSIS — Z720 Tobacco use: Secondary | ICD-10-CM

## 2015-12-25 DIAGNOSIS — F17200 Nicotine dependence, unspecified, uncomplicated: Secondary | ICD-10-CM

## 2015-12-25 DIAGNOSIS — Z89511 Acquired absence of right leg below knee: Secondary | ICD-10-CM | POA: Diagnosis not present

## 2015-12-25 DIAGNOSIS — I70219 Atherosclerosis of native arteries of extremities with intermittent claudication, unspecified extremity: Secondary | ICD-10-CM

## 2015-12-25 NOTE — Patient Instructions (Signed)
Peripheral Vascular Disease Peripheral vascular disease (PVD) is a disease of the blood vessels that are not part of your heart and brain. A simple term for PVD is poor circulation. In most cases, PVD narrows the blood vessels that carry blood from your heart to the rest of your body. This can result in a decreased supply of blood to your arms, legs, and internal organs, like your stomach or kidneys. However, it most often affects a person's lower legs and feet. There are two types of PVD.  Organic PVD. This is the more common type. It is caused by damage to the structure of blood vessels.  Functional PVD. This is caused by conditions that make blood vessels contract and tighten (spasm). Without treatment, PVD tends to get worse over time. PVD can also lead to acute ischemic limb. This is when an arm or limb suddenly has trouble getting enough blood. This is a medical emergency. CAUSES Each type of PVD has many different causes. The most common cause of PVD is buildup of a fatty material (plaque) inside of your arteries (atherosclerosis). Small amounts of plaque can break off from the walls of the blood vessels and become lodged in a smaller artery. This blocks blood flow and can cause acute ischemic limb. Other common causes of PVD include:  Blood clots that form inside of blood vessels.  Injuries to blood vessels.  Diseases that cause inflammation of blood vessels or cause blood vessel spasms.  Health behaviors and health history that increase your risk of developing PVD. RISK FACTORS  You may have a greater risk of PVD if you:  Have a family history of PVD.  Have certain medical conditions, including:  High cholesterol.  Diabetes.  High blood pressure (hypertension).  Coronary heart disease.  Past problems with blood clots.  Past injury, such as burns or a broken bone. These may have damaged blood vessels in your limbs.  Buerger disease. This is caused by inflamed blood  vessels in your hands and feet.  Some forms of arthritis.  Rare birth defects that affect the arteries in your legs.  Use tobacco.  Do not get enough exercise.  Are obese.  Are age 50 or older. SIGNS AND SYMPTOMS  PVD may cause many different symptoms. Your symptoms depend on what part of your body is not getting enough blood. Some common signs and symptoms include:  Cramps in your lower legs. This may be a symptom of poor leg circulation (claudication).  Pain and weakness in your legs while you are physically active that goes away when you rest (intermittent claudication).  Leg pain when at rest.  Leg numbness, tingling, or weakness.  Coldness in a leg or foot, especially when compared with the other leg.  Skin or hair changes. These can include:  Hair loss.  Shiny skin.  Pale or bluish skin.  Thick toenails.  Inability to get or maintain an erection (erectile dysfunction). People with PVD are more prone to developing ulcers and sores on their toes, feet, or legs. These may take longer than normal to heal. DIAGNOSIS Your health care provider may diagnose PVD from your signs and symptoms. The health care provider will also do a physical exam. You may have tests to find out what is causing your PVD and determine its severity. Tests may include:  Blood pressure recordings from your arms and legs and measurements of the strength of your pulses (pulse volume recordings).  Imaging studies using sound waves to take pictures of   the blood flow through your blood vessels (Doppler ultrasound).  Injecting a dye into your blood vessels before having imaging studies using:  X-rays (angiogram or arteriogram).  Computer-generated X-rays (CT angiogram).  A powerful electromagnetic field and a computer (magnetic resonance angiogram or MRA). TREATMENT Treatment for PVD depends on the cause of your condition and the severity of your symptoms. It also depends on your age. Underlying  causes need to be treated and controlled. These include long-lasting (chronic) conditions, such as diabetes, high cholesterol, and high blood pressure. You may need to first try making lifestyle changes and taking medicines. Surgery may be needed if these do not work. Lifestyle changes may include:  Quitting smoking.  Exercising regularly.  Following a low-fat, low-cholesterol diet. Medicines may include:  Blood thinners to prevent blood clots.  Medicines to improve blood flow.  Medicines to improve your blood cholesterol levels. Surgical procedures may include:  A procedure that uses an inflated balloon to open a blocked artery and improve blood flow (angioplasty).  A procedure to put in a tube (stent) to keep a blocked artery open (stent implant).  Surgery to reroute blood flow around a blocked artery (peripheral bypass surgery).  Surgery to remove dead tissue from an infected wound on the affected limb.  Amputation. This is surgical removal of the affected limb. This may be necessary in cases of acute ischemic limb that are not improved through medical or surgical treatments. HOME CARE INSTRUCTIONS  Take medicines only as directed by your health care provider.  Do not use any tobacco products, including cigarettes, chewing tobacco, or electronic cigarettes. If you need help quitting, ask your health care provider.  Lose weight if you are overweight, and maintain a healthy weight as directed by your health care provider.  Eat a diet that is low in fat and cholesterol. If you need help, ask your health care provider.  Exercise regularly. Ask your health care provider to suggest some good activities for you.  Use compression stockings or other mechanical devices as directed by your health care provider.  Take good care of your feet.  Wear comfortable shoes that fit well.  Check your feet often for any cuts or sores. SEEK MEDICAL CARE IF:  You have cramps in your legs  while walking.  You have leg pain when you are at rest.  You have coldness in a leg or foot.  Your skin changes.  You have erectile dysfunction.  You have cuts or sores on your feet that are not healing. SEEK IMMEDIATE MEDICAL CARE IF:  Your arm or leg turns cold and blue.  Your arms or legs become red, warm, swollen, painful, or numb.  You have chest pain or trouble breathing.  You suddenly have weakness in your face, arm, or leg.  You become very confused or lose the ability to speak.  You suddenly have a very bad headache or lose your vision.   This information is not intended to replace advice given to you by your health care provider. Make sure you discuss any questions you have with your health care provider.   Document Released: 10/21/2004 Document Revised: 10/04/2014 Document Reviewed: 02/21/2014 Elsevier Interactive Patient Education 2016 Elsevier Inc[employer]     Steps to Quit Smoking  Smoking tobacco can be harmful to your health and can affect almost every organ in your body. Smoking puts you, and those around you, at risk for developing many serious chronic diseases. Quitting smoking is difficult, but it is one of   the best things that you can do for your health. It is never too late to quit. WHAT ARE THE BENEFITS OF QUITTING SMOKING? When you quit smoking, you lower your risk of developing serious diseases and conditions, such as:  Lung cancer or lung disease, such as COPD.  Heart disease.  Stroke.  Heart attack.  Infertility.  Osteoporosis and bone fractures. Additionally, symptoms such as coughing, wheezing, and shortness of breath may get better when you quit. You may also find that you get sick less often because your body is stronger at fighting off colds and infections. If you are pregnant, quitting smoking can help to reduce your chances of having a baby of low birth weight. HOW DO I GET READY TO QUIT? When you decide to quit smoking, create a plan to  make sure that you are successful. Before you quit:  Pick a date to quit. Set a date within the next two weeks to give you time to prepare.  Write down the reasons why you are quitting. Keep this list in places where you will see it often, such as on your bathroom mirror or in your car or wallet.  Identify the people, places, things, and activities that make you want to smoke (triggers) and avoid them. Make sure to take these actions:  Throw away all cigarettes at home, at work, and in your car.  Throw away smoking accessories, such as ashtrays and lighters.  Clean your car and make sure to empty the ashtray.  Clean your home, including curtains and carpets.  Tell your family, friends, and coworkers that you are quitting. Support from your loved ones can make quitting easier.  Talk with your health care provider about your options for quitting smoking.  Find out what treatment options are covered by your health insurance. WHAT STRATEGIES CAN I USE TO QUIT SMOKING?  Talk with your healthcare provider about different strategies to quit smoking. Some strategies include:  Quitting smoking altogether instead of gradually lessening how much you smoke over a period of time. Research shows that quitting "cold turkey" is more successful than gradually quitting.  Attending in-person counseling to help you build problem-solving skills. You are more likely to have success in quitting if you attend several counseling sessions. Even short sessions of 10 minutes can be effective.  Finding resources and support systems that can help you to quit smoking and remain smoke-free after you quit. These resources are most helpful when you use them often. They can include:  Online chats with a counselor.  Telephone quitlines.  Printed self-help materials.  Support groups or group counseling.  Text messaging programs.  Mobile phone applications.  Taking medicines to help you quit smoking. (If you are  pregnant or breastfeeding, talk with your health care provider first.) Some medicines contain nicotine and some do not. Both types of medicines help with cravings, but the medicines that include nicotine help to relieve withdrawal symptoms. Your health care provider may recommend:  Nicotine patches, gum, or lozenges.  Nicotine inhalers or sprays.  Non-nicotine medicine that is taken by mouth. Talk with your health care provider about combining strategies, such as taking medicines while you are also receiving in-person counseling. Using these two strategies together makes you more likely to succeed in quitting than if you used either strategy on its own. If you are pregnant or breastfeeding, talk with your health care provider about finding counseling or other support strategies to quit smoking. Do not take medicine to help you   quit smoking unless told to do so by your health care provider. WHAT THINGS CAN I DO TO MAKE IT EASIER TO QUIT? Quitting smoking might feel overwhelming at first, but there is a lot that you can do to make it easier. Take these important actions:  Reach out to your family and friends and ask that they support and encourage you during this time. Call telephone quitlines, reach out to support groups, or work with a counselor for support.  Ask people who smoke to avoid smoking around you.  Avoid places that trigger you to smoke, such as bars, parties, or smoke-break areas at work.  Spend time around people who do not smoke.  Lessen stress in your life, because stress can be a smoking trigger for some people. To lessen stress, try:  Exercising regularly.  Deep-breathing exercises.  Yoga.  Meditating.  Performing a body scan. This involves closing your eyes, scanning your body from head to toe, and noticing which parts of your body are particularly tense. Purposefully relax the muscles in those areas.  Download or purchase mobile phone or tablet apps (applications)  that can help you stick to your quit plan by providing reminders, tips, and encouragement. There are many free apps, such as QuitGuide from the CDC (Centers for Disease Control and Prevention). You can find other support for quitting smoking (smoking cessation) through smokefree.gov and other websites. HOW WILL I FEEL WHEN I QUIT SMOKING? Within the first 24 hours of quitting smoking, you may start to feel some withdrawal symptoms. These symptoms are usually most noticeable 2-3 days after quitting, but they usually do not last beyond 2-3 weeks. Changes or symptoms that you might experience include:  Mood swings.  Restlessness, anxiety, or irritation.  Difficulty concentrating.  Dizziness.  Strong cravings for sugary foods in addition to nicotine.  Mild weight gain.  Constipation.  Nausea.  Coughing or a sore throat.  Changes in how your medicines work in your body.  A depressed mood.  Difficulty sleeping (insomnia). After the first 2-3 weeks of quitting, you may start to notice more positive results, such as:  Improved sense of smell and taste.  Decreased coughing and sore throat.  Slower heart rate.  Lower blood pressure.  Clearer skin.  The ability to breathe more easily.  Fewer sick days. Quitting smoking is very challenging for most people. Do not get discouraged if you are not successful the first time. Some people need to make many attempts to quit before they achieve long-term success. Do your best to stick to your quit plan, and talk with your health care provider if you have any questions or concerns.   This information is not intended to replace advice given to you by your health care provider. Make sure you discuss any questions you have with your health care provider.   Document Released: 09/07/2001 Document Revised: 01/28/2015 Document Reviewed: 01/28/2015 Elsevier Interactive Patient Education 2016 Elsevier Inc.  

## 2015-12-25 NOTE — Progress Notes (Signed)
[facility] [place] HISTORY AND PHYSICAL -PAD  History of Present Illness [name] is a 55 y.o. male patient of Dr. Oneida Alar who has had known peripheral arterial disease in his left lower extremity for several years with no decline. 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 He is walking well on a prosthetic leg. Unfortunately he continues to smoke. Dr. Oneida Alar last saw pt on 07/03/15. At that time the left leg ABI was 0.5. This was similar to his ABIs over the last few years. Patient has had long-standing left leg claudication. This has been somewhat disabling for him but not really lifestyle limiting. He is doing well with his right below-knee amputation. He was to follow-up with repeat ABIs in one year.  Pt returns today with c/o cold left foot, tight feeling in left foot, that he determined was caused by putting on 2 pair of socks that were too tight; this occurred at the end of February/ beginning of March 2017, seemed to improve after he removed the double socks.  He has left calf claudication after walking 1/4 mile, relieved by rest.  The dark discoloration of the tips of his left 3rd and 4th toes he did not notice until now. Pt states he never takes his socks off until he puts another pair on.   His states his hepatitis C has resolved with taking Harvoni.   The patient denies New Medical or Surgical History.  Pt Diabetic: No Pt smoker: smoker  (1 ppd, decreased from 2 ppd, started at age 40 yrs)  Pt meds include: Statin :No Betablocker: No ASA: Yes Other anticoagulants/antiplatelets: no  Past Medical History  Diagnosis Date  . Anxiety   . Hepatitis C     no prior treatment  . Asthma   . Depression   . DVT (deep venous thrombosis) ([place]) 2009    Social History Social History  Substance Use Topics  . Smoking status: Current Every Day Smoker -- 1.50 packs/day for  29 years    Types: Cigarettes  . Smokeless tobacco: Never Used     Comment: cigarettes  . Alcohol Use: No     Comment: Patient quit drinking about a year ago; history of heavy ETOH use    Family History Family History  Problem Relation Age of Onset  . Colon cancer Neg Hx   . Vision loss Mother   . Ulcers Father     Past Surgical History  Procedure Laterality Date  . Femoral-tibial bypass graft  2009    Right side using non-reversed GSV   By Dr. Oneida Alar  . Femoral-tibial bypass graft  11/24/2007    Right femoral to anterior tibial BPG   by Dr. Oneida Alar  . 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 Aortogram  08/08/2009    for LLE claudication     By Dr. Oneida Alar  . Cholecystectomy    . Bladder tumor excision  8/812  . Hand tendon surgery Left 2013    Left 5th finger  . Colonoscopy with propofol N/A 05/30/2014    UVO:[id number] colonic polyp-likely source of hematochezia-removed as described above  . Esophagogastroduodenoscopy (egd) with propofol N/A 05/30/2014    IHK:VQQVZD and bulbar erosions s/p gastric biopsy. No evidence of portal gastropathy on today's examination.  . Biopsy N/A 05/30/2014    Procedure: BIOPSY;  Surgeon: [name], MD;  Location: [facility];  Service: Endoscopy;  Laterality: N/A;  . Polypectomy  05/30/2014    Procedure: POLYPECTOMY;  Surgeon: [name], MD;  Location: [facility];  Service: Endoscopy;;  . Incisional hernia repair N/A 12/25/2014    Procedure: HERNIA REPAIR INCISIONAL WITH MESH;  Surgeon: [name], MD;  Location: [facility];  Service: General;  Laterality: N/A;  . Insertion of mesh N/A 12/25/2014    Procedure: INSERTION OF MESH;  Surgeon: [name], MD;  Location: [facility];  Service: General;  Laterality: N/A;    Allergies  Allergen Reactions  . Lyrica [Pregabalin] Palpitations    Chest pain and heart palps.  . Neurontin [Gabapentin] Palpitations    Chest pain and heart palp    Current Outpatient Prescriptions  Medication Sig  Dispense Refill  . alprazolam (XANAX) 2 MG tablet Take 2 mg by mouth 4 (four) times daily as needed for anxiety.     [name] aspirin 81 MG EC tablet Take 81 mg by mouth daily.      . citalopram (CELEXA) 40 MG tablet Take 40 mg by mouth daily.  11  . Doxylamine Succinate, Sleep, (SLEEP AID PO) Take 2 tablets by mouth daily as needed (sleep).    [name] HYDROcodone-acetaminophen (NORCO) 10-325 MG per tablet Take 1 tablet by mouth every 4 (four) hours as needed for moderate pain. pain 50 tablet 0   No current facility-administered medications for this visit.    ROS: See HPI for pertinent positives and negatives.   Physical Examination  Filed Vitals:   12/25/15 1326  BP: 137/87  Pulse: 90  Height: '6\' 1"'$  (1.854 m)  Weight: 227 lb (102.967 kg)  SpO2: 98%   Body mass index is 29.96 kg/(m^2).  General: A&O x 3, WDWN. Gait: limp, wearing right BKA prosthesis, using cane Eyes: PERRLA. Pulmonary: CTAB, fair air movement, respirations are non labored, without wheezes , rales or rhonchi. Cardiac: regular rhythm, no detected murmur.         Carotid Bruits Right Left   Negative Negative  Aorta is not palpable. Radial pulses: are 2+ palpable and =                           VASCULAR EXAM: Extremities with ischemic changes in left 3rd and 4th toe tips: cyanotic; without Gangrene; without open wounds. Right BKA with prosthesis in place.                                                                                                          LE Pulses Right Left       FEMORAL  not palpable  1+ palpable        POPLITEAL  not palpable   not palpable       POSTERIOR TIBIAL  BKA  Not palpable        DORSALIS PEDIS      ANTERIOR TIBIAL BKA  Not palpable    Abdomen: soft, NT, no palpable masses. Skin: no rashes, no ulcers. Musculoskeletal: no muscle wasting or  atrophy, see Extremities.  Neurologic: A&O X 3; Appropriate Affect, MOTOR FUNCTION:  moving all extremities equally, motor strength 5/5  throughout. Speech is fluent/normal. CN 2-12 intact.    Non-Invasive Vascular Imaging: DATE: 12/22/2015 ABI: RIGHT: BKA;  LEFT: 0.55 (0.50, 07/03/2015), Waveforms: monophasic posterior tibial and peroneal, anterior tibial is not detected. TBI is 0.35   ASSESSMENT: [name] is a 55 y.o. male who has had known peripheral arterial disease in his left lower extremity for several years with no decline. 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 He is walking well on a prosthetic leg. Unfortunately he continues to smoke. Dr. Oneida Alar last saw pt on 07/03/15. At that time the left leg ABI was 0.5. This was similar to his ABIs over the last few years. Patient has had long-standing left leg claudication. This has been somewhat disabling for him but not really lifestyle limiting. He is doing well with his right below-knee amputation. He was to follow-up with repeat ABIs in one year.  Pt returns today with c/o cold left foot, tight feeling in left foot, that he determined was caused by putting on 2 pair of socks that were too tight; this occurred at the end of February/ beginning of March 2017, seemed to improve after he removed the double socks.  He has left calf claudication after walking 1/4 mile, relieved by rest.  The dark discoloration of the tips of his left 3rd and 4th toes he did not notice until now. Pt states he never takes his socks off until he puts another pair on.   Left ABI remains stable suggesting moderate arterial occlusive disease, and his left foot sx's resolved when he removed the tight socks. However, pt did not notice the dark tips of his left 3rd and 4th toes, not gangrenous at this point.   Dr. Oneida Alar spoke with and examined pt.  PLAN:  Based on the patient's vascular studies and examination, pt will be scheduled for arteriogram by Dr. Oneida Alar on 01/02/16, left leg run off, ischemic left 3rd  and 4th toes.   The patient was counseled re smoking cessation and given several free resources re smoking cessation.  I discussed in depth with the patient the nature of atherosclerosis, and emphasized the importance of maximal medical management including strict control of blood pressure, blood glucose, and lipid levels, obtaining regular exercise, and cessation of smoking.  The patient is aware that without maximal medical management the underlying atherosclerotic disease process will progress, limiting the benefit of any interventions.  The patient was given information about PAD including signs, symptoms, treatment, what symptoms should prompt the patient to seek immediate medical care, and risk reduction measures to take.  Clemon Chambers, RN, MSN, FNP-C [facility] Phone: [phone]  Clinic MD: Oneida Alar  12/25/2015 1:21 PM

## 2015-12-30 ENCOUNTER — Other Ambulatory Visit: Payer: Self-pay | Admitting: *Deleted

## 2015-12-30 ENCOUNTER — Ambulatory Visit (HOSPITAL_COMMUNITY)
Admission: RE | Admit: 2015-12-30 | Discharge: 2015-12-30 | Disposition: A | Discharge status: Home / Self Care | Payer: Medicare Other | Source: Ambulatory Visit | Attending: Vascular Surgery | Admitting: Vascular Surgery

## 2015-12-30 ENCOUNTER — Encounter (HOSPITAL_COMMUNITY)
Admission: RE | Disposition: A | Discharge status: Home / Self Care | Payer: Self-pay | Source: Ambulatory Visit | Attending: Vascular Surgery

## 2015-12-30 ENCOUNTER — Telehealth: Payer: Self-pay | Admitting: Vascular Surgery

## 2015-12-30 DIAGNOSIS — Z7982 Long term (current) use of aspirin: Secondary | ICD-10-CM | POA: Insufficient documentation

## 2015-12-30 DIAGNOSIS — I739 Peripheral vascular disease, unspecified: Secondary | ICD-10-CM

## 2015-12-30 DIAGNOSIS — Z79899 Other long term (current) drug therapy: Secondary | ICD-10-CM | POA: Insufficient documentation

## 2015-12-30 DIAGNOSIS — F17210 Nicotine dependence, cigarettes, uncomplicated: Secondary | ICD-10-CM | POA: Diagnosis not present

## 2015-12-30 DIAGNOSIS — I745 Embolism and thrombosis of iliac artery: Secondary | ICD-10-CM | POA: Insufficient documentation

## 2015-12-30 DIAGNOSIS — Z89511 Acquired absence of right leg below knee: Secondary | ICD-10-CM | POA: Insufficient documentation

## 2015-12-30 DIAGNOSIS — F419 Anxiety disorder, unspecified: Secondary | ICD-10-CM | POA: Insufficient documentation

## 2015-12-30 DIAGNOSIS — I70262 Atherosclerosis of native arteries of extremities with gangrene, left leg: Secondary | ICD-10-CM | POA: Diagnosis not present

## 2015-12-30 DIAGNOSIS — F329 Major depressive disorder, single episode, unspecified: Secondary | ICD-10-CM | POA: Diagnosis not present

## 2015-12-30 DIAGNOSIS — I96 Gangrene, not elsewhere classified: Secondary | ICD-10-CM | POA: Diagnosis present

## 2015-12-30 DIAGNOSIS — Z01810 Encounter for preprocedural cardiovascular examination: Secondary | ICD-10-CM

## 2015-12-30 DIAGNOSIS — Z95820 Peripheral vascular angioplasty status with implants and grafts: Secondary | ICD-10-CM | POA: Insufficient documentation

## 2015-12-30 HISTORY — PX: LOWER EXTREMITY ANGIOGRAM: SHX5508

## 2015-12-30 HISTORY — PX: PERIPHERAL VASCULAR CATHETERIZATION: SHX172C

## 2015-12-30 LAB — POCT I-STAT, CHEM 8
BUN: 13 mg/dL (ref 6–20)
CALCIUM ION: 1.08 mmol/L — AB (ref 1.12–1.23)
CHLORIDE: 101 mmol/L (ref 101–111)
CREATININE: 1.1 mg/dL (ref 0.61–1.24)
GLUCOSE: 102 mg/dL — AB (ref 65–99)
HCT: 50 % (ref 39.0–52.0)
Hemoglobin: 17 g/dL (ref 13.0–17.0)
Potassium: 4.9 mmol/L (ref 3.5–5.1)
Sodium: 138 mmol/L (ref 135–145)
TCO2: 29 mmol/L (ref 0–100)

## 2015-12-30 SURGERY — ABDOMINAL AORTOGRAM

## 2015-12-30 MED ORDER — ACETAMINOPHEN 325 MG PO TABS: 325.0000 mg | ORAL_TABLET | ORAL | Status: DC | PRN

## 2015-12-30 MED ORDER — HEPARIN (PORCINE) IN NACL 2-0.9 UNIT/ML-% IJ SOLN
INTRAMUSCULAR | Status: DC | PRN
  Administered 2015-12-30: 1000 mL

## 2015-12-30 MED ORDER — LABETALOL HCL 5 MG/ML IV SOLN: 10.0000 mg | INTRAVENOUS | Status: DC | PRN

## 2015-12-30 MED ORDER — SODIUM CHLORIDE 0.9 % IV SOLN
INTRAVENOUS | Status: DC
  Administered 2015-12-30: 11:00:00 via INTRAVENOUS

## 2015-12-30 MED ORDER — METOPROLOL TARTRATE 1 MG/ML IV SOLN: 2.0000 mg | INTRAVENOUS | Status: DC | PRN

## 2015-12-30 MED ORDER — MIDAZOLAM HCL 2 MG/2ML IJ SOLN
INTRAMUSCULAR | Status: DC | PRN
  Administered 2015-12-30: 2 mg via INTRAVENOUS

## 2015-12-30 MED ORDER — SODIUM CHLORIDE 0.45 % IV SOLN: INTRAVENOUS | Status: DC

## 2015-12-30 MED ORDER — MORPHINE SULFATE (PF) 10 MG/ML IV SOLN: 2.0000 mg | INTRAVENOUS | Status: DC | PRN

## 2015-12-30 MED ORDER — ACETAMINOPHEN 325 MG RE SUPP: 325.0000 mg | RECTAL | Status: DC | PRN

## 2015-12-30 MED ORDER — HYDRALAZINE HCL 20 MG/ML IJ SOLN: 5.0000 mg | INTRAMUSCULAR | Status: DC | PRN

## 2015-12-30 MED ORDER — MIDAZOLAM HCL 2 MG/2ML IJ SOLN
INTRAMUSCULAR | Status: AC
  Filled 2015-12-30: qty 2

## 2015-12-30 MED ORDER — LIDOCAINE HCL (PF) 1 % IJ SOLN
INTRAMUSCULAR | Status: DC | PRN
  Administered 2015-12-30: 13 mL

## 2015-12-30 MED ORDER — IODIXANOL 320 MG/ML IV SOLN
INTRAVENOUS | Status: DC | PRN
  Administered 2015-12-30: 75 mL via INTRAVENOUS

## 2015-12-30 MED ORDER — LIDOCAINE HCL (PF) 1 % IJ SOLN
INTRAMUSCULAR | Status: AC
  Filled 2015-12-30: qty 30

## 2015-12-30 MED ORDER — OXYCODONE HCL 5 MG PO TABS: 5.0000 mg | ORAL_TABLET | ORAL | Status: DC | PRN

## 2015-12-30 MED ORDER — HEPARIN (PORCINE) IN NACL 2-0.9 UNIT/ML-% IJ SOLN
INTRAMUSCULAR | Status: AC
  Filled 2015-12-30: qty 1000

## 2015-12-30 MED ORDER — ONDANSETRON HCL 4 MG/2ML IJ SOLN: 4.0000 mg | Freq: Four times a day (QID) | INTRAMUSCULAR | Status: DC | PRN

## 2015-12-30 SURGICAL SUPPLY — 7 items
CATH ANGIO 5F PIGTAIL 65CM (CATHETERS) ×1 IMPLANT
KIT PV (KITS) ×3 IMPLANT
SHEATH PINNACLE 5F 10CM (SHEATH) ×1 IMPLANT
SYR MEDRAD MARK V 150ML (SYRINGE) ×3 IMPLANT
TRANSDUCER W/STOPCOCK (MISCELLANEOUS) ×3 IMPLANT
TRAY PV CATH (CUSTOM PROCEDURE TRAY) ×3 IMPLANT
WIRE HITORQ VERSACORE ST 145CM (WIRE) ×1 IMPLANT

## 2015-12-30 NOTE — Discharge Instructions (Signed)

## 2015-12-30 NOTE — Telephone Encounter (Signed)
-----   Message from Mena Goes, RN sent at 12/30/2015  1:02 PM EDT ----- Regarding: schedule Colletta [place] is aware of this also.  ----- Message -----    From: [name], MD    Sent: 12/30/2015  12:36 PM      To: [facility] Charge Pool  Aortogram with left leg runoff.  [place] left groin  He needs cardiology eval preop.  He also needs left leg vein map.  He then needs left femoral to posterior tibial bypass  Ruta Hinds

## 2015-12-30 NOTE — Op Note (Addendum)
Procedure: Abdominal aortogram with left lower extremity runoff  Preoperative diagnosis: Gangrene left foot Postoperative diagnosis: Same  Anesthesia: Local with IV sedation (sedation time 12:15-12:34 PM)  Operative findings: #1 patent aortoiliac system left side with patent left common iliac stent #2 occlusion right external iliac artery #3 occlusion left superficial femoral and popliteal artery #4 two-vessel runoff left foot posterior tibial and anterior tibial artery with diminutive peroneal  Operative details: After obtaining informed consent, the patient stated the PV lab. The patient was placed in supine position the Angio table. Both groins were prepped and draped in usual sterile fashion. Conscious sedation was performed with 2 mg of Versed. Entire time of monitoring was approximately 20 minutes and I was present for the entire procedure and monitoring. Ultrasound was used to examine the right groin. There was no obvious patent arterial structure at the right groin level. Attention was then turned to the left common femoral artery. Ultrasound was used to identify the left common femoral artery and local anesthesia was infiltrated over this. An 11 blade was used to nick the skin over the artery and a hemostat used to dilate the tract. An introducer needle was then used to cannulate the left common femoral artery and an 035 versacore wire threaded up the abdominal aorta under fluoroscopic guidance. A 5 French sheath was then placed over the guidewire and the left common femoral artery. This was thoroughly flushed with heparinized saline. A 5 French pigtail catheter was then placed over the guidewire and the abdominal aorta. Abdominal aortogram was obtained in [facility] projection. The left and right renal arteries are widely patent. The infrarenal abdominal aorta is patent. The left common external and internal iliac arteries are patent. The right common iliac artery is patent. The right internal iliac  artery is patent. The right external iliac artery is occluded. There is a left common iliac artery stent which is widely patent. The  Next the Catheters pulled down just above the aortic bifurcations and a pelvic angiogram was performed to confirm the above findings. Next a left lower extremity runoff he was obtained. The pigtail catheter was removed over a guidewire and the lower extremity arteriogram obtained through the sheath. Left common femoral artery is patent. Left profunda femoris artery is patent. Left superficial femoral artery occludes in the mid leg. The below-knee and above-knee popliteal artery is occluded. The posterior tibial anterior tibial arteries do reconstitute via profunda collaterals. These are in continuity all the way to the level of the foot. There is a patent peroneal artery but it is diminutive. At this point the 5 French sheath was removed and hemostasis obtained with direct pressure.  The patient tolerated procedure well and there were complications. The patient was taken to the holding area in stable condition.  Operative management: The patient will be scheduled for a left femoral to posterior tibial bypass in the near future. He will undergo cardiac risk stratification prior to this.  Ruta Hinds, MD [facility] [place] [facility] Office: [phone] Pager: [phone]

## 2015-12-30 NOTE — Telephone Encounter (Signed)
sched vein mapping for 01/01/16 at 3. Lm on hm# to inform pt.

## 2015-12-30 NOTE — H&P (View-Only) (Signed)
[facility] [place] HISTORY AND PHYSICAL -PAD  History of Present Illness [name] is a 55 y.o. male patient of Dr. Oneida Alar who has had known peripheral arterial disease in his left lower extremity for several years with no decline. 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 He is walking well on a prosthetic leg. Unfortunately he continues to smoke. Dr. Oneida Alar last saw pt on 07/03/15. At that time the left leg ABI was 0.5. This was similar to his ABIs over the last few years. Patient has had long-standing left leg claudication. This has been somewhat disabling for him but not really lifestyle limiting. He is doing well with his right below-knee amputation. He was to follow-up with repeat ABIs in one year.  Pt returns today with c/o cold left foot, tight feeling in left foot, that he determined was caused by putting on 2 pair of socks that were too tight; this occurred at the end of February/ beginning of March 2017, seemed to improve after he removed the double socks.  He has left calf claudication after walking 1/4 mile, relieved by rest.  The dark discoloration of the tips of his left 3rd and 4th toes he did not notice until now. Pt states he never takes his socks off until he puts another pair on.   His states his hepatitis C has resolved with taking Harvoni.   The patient denies New Medical or Surgical History.  Pt Diabetic: No Pt smoker: smoker  (1 ppd, decreased from 2 ppd, started at age 22 yrs)  Pt meds include: Statin :No Betablocker: No ASA: Yes Other anticoagulants/antiplatelets: no  Past Medical History  Diagnosis Date  . Anxiety   . Hepatitis C     no prior treatment  . Asthma   . Depression   . DVT (deep venous thrombosis) ([place]) 2009    Social History Social History  Substance Use Topics  . Smoking status: Current Every Day Smoker -- 1.50 packs/day for  29 years    Types: Cigarettes  . Smokeless tobacco: Never Used     Comment: cigarettes  . Alcohol Use: No     Comment: Patient quit drinking about a year ago; history of heavy ETOH use    Family History Family History  Problem Relation Age of Onset  . Colon cancer Neg Hx   . Vision loss Mother   . Ulcers Father     Past Surgical History  Procedure Laterality Date  . Femoral-tibial bypass graft  2009    Right side using non-reversed GSV   By Dr. Oneida Alar  . Femoral-tibial bypass graft  11/24/2007    Right femoral to anterior tibial BPG   by Dr. Oneida Alar  . 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 Aortogram  08/08/2009    for LLE claudication     By Dr. Oneida Alar  . Cholecystectomy    . Bladder tumor excision  8/812  . Hand tendon surgery Left 2013    Left 5th finger  . Colonoscopy with propofol N/A 05/30/2014    BTD:VVOHYW colonic polyp-likely source of hematochezia-removed as described above  . Esophagogastroduodenoscopy (egd) with propofol N/A 05/30/2014    VPX:TGGYIR and bulbar erosions s/p gastric biopsy. No evidence of portal gastropathy on today's examination.  . Biopsy N/A 05/30/2014    Procedure: BIOPSY;  Surgeon: [name], MD;  Location: [facility];  Service: Endoscopy;  Laterality: N/A;  . Polypectomy  05/30/2014    Procedure: POLYPECTOMY;  Surgeon: [name], MD;  Location: [facility];  Service: Endoscopy;;  . Incisional hernia repair N/A 12/25/2014    Procedure: HERNIA REPAIR INCISIONAL WITH MESH;  Surgeon: [name], MD;  Location: [facility];  Service: General;  Laterality: N/A;  . Insertion of mesh N/A 12/25/2014    Procedure: INSERTION OF MESH;  Surgeon: [name], MD;  Location: [facility];  Service: General;  Laterality: N/A;    Allergies  Allergen Reactions  . Lyrica [Pregabalin] Palpitations    Chest pain and heart palps.  . Neurontin [Gabapentin] Palpitations    Chest pain and heart palp    Current Outpatient Prescriptions  Medication Sig  Dispense Refill  . alprazolam (XANAX) 2 MG tablet Take 2 mg by mouth 4 (four) times daily as needed for anxiety.     [name] aspirin 81 MG EC tablet Take 81 mg by mouth daily.      . citalopram (CELEXA) 40 MG tablet Take 40 mg by mouth daily.  11  . Doxylamine Succinate, Sleep, (SLEEP AID PO) Take 2 tablets by mouth daily as needed (sleep).    [name] HYDROcodone-acetaminophen (NORCO) 10-325 MG per tablet Take 1 tablet by mouth every 4 (four) hours as needed for moderate pain. pain 50 tablet 0   No current facility-administered medications for this visit.    ROS: See HPI for pertinent positives and negatives.   Physical Examination  Filed Vitals:   12/25/15 1326  BP: 137/87  Pulse: 90  Height: '6\' 1"'$  (1.854 m)  Weight: 227 lb (102.967 kg)  SpO2: 98%   Body mass index is 29.96 kg/(m^2).  General: A&O x 3, WDWN. Gait: limp, wearing right BKA prosthesis, using cane Eyes: PERRLA. Pulmonary: CTAB, fair air movement, respirations are non labored, without wheezes , rales or rhonchi. Cardiac: regular rhythm, no detected murmur.         Carotid Bruits Right Left   Negative Negative  Aorta is not palpable. Radial pulses: are 2+ palpable and =                           VASCULAR EXAM: Extremities with ischemic changes in left 3rd and 4th toe tips: cyanotic; without Gangrene; without open wounds. Right BKA with prosthesis in place.                                                                                                          LE Pulses Right Left       FEMORAL  not palpable  1+ palpable        POPLITEAL  not palpable   not palpable       POSTERIOR TIBIAL  BKA  Not palpable        DORSALIS PEDIS      ANTERIOR TIBIAL BKA  Not palpable    Abdomen: soft, NT, no palpable masses. Skin: no rashes, no ulcers. Musculoskeletal: no muscle wasting or  atrophy, see Extremities.  Neurologic: A&O X 3; Appropriate Affect, MOTOR FUNCTION:  moving all extremities equally, motor strength 5/5  throughout. Speech is fluent/normal. CN 2-12 intact.    Non-Invasive Vascular Imaging: DATE: 12/22/2015 ABI: RIGHT: BKA;  LEFT: 0.55 (0.50, 07/03/2015), Waveforms: monophasic posterior tibial and peroneal, anterior tibial is not detected. TBI is 0.35   ASSESSMENT: [name] is a 55 y.o. male who has had known peripheral arterial disease in his left lower extremity for several years with no decline. 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 He is walking well on a prosthetic leg. Unfortunately he continues to smoke. Dr. Oneida Alar last saw pt on 07/03/15. At that time the left leg ABI was 0.5. This was similar to his ABIs over the last few years. Patient has had long-standing left leg claudication. This has been somewhat disabling for him but not really lifestyle limiting. He is doing well with his right below-knee amputation. He was to follow-up with repeat ABIs in one year.  Pt returns today with c/o cold left foot, tight feeling in left foot, that he determined was caused by putting on 2 pair of socks that were too tight; this occurred at the end of February/ beginning of March 2017, seemed to improve after he removed the double socks.  He has left calf claudication after walking 1/4 mile, relieved by rest.  The dark discoloration of the tips of his left 3rd and 4th toes he did not notice until now. Pt states he never takes his socks off until he puts another pair on.   Left ABI remains stable suggesting moderate arterial occlusive disease, and his left foot sx's resolved when he removed the tight socks. However, pt did not notice the dark tips of his left 3rd and 4th toes, not gangrenous at this point.   Dr. Oneida Alar spoke with and examined pt.  PLAN:  Based on the patient's vascular studies and examination, pt will be scheduled for arteriogram by Dr. Oneida Alar on 01/02/16, left leg run off, ischemic left 3rd  and 4th toes.   The patient was counseled re smoking cessation and given several free resources re smoking cessation.  I discussed in depth with the patient the nature of atherosclerosis, and emphasized the importance of maximal medical management including strict control of blood pressure, blood glucose, and lipid levels, obtaining regular exercise, and cessation of smoking.  The patient is aware that without maximal medical management the underlying atherosclerotic disease process will progress, limiting the benefit of any interventions.  The patient was given information about PAD including signs, symptoms, treatment, what symptoms should prompt the patient to seek immediate medical care, and risk reduction measures to take.  Clemon Chambers, RN, MSN, FNP-C [facility] Phone: [phone]  Clinic MD: Oneida Alar  12/25/2015 1:21 PM

## 2015-12-30 NOTE — Interval H&P Note (Signed)
History and Physical Interval Note:  12/30/2015 11:04 AM  [name]  has presented today for surgery, with the diagnosis of left lower/pvd with left leg claudication  The various methods of treatment have been discussed with the patient and family. After consideration of risks, benefits and other options for treatment, the patient has consented to  Procedure(s): Abdominal Aortogram w/Lower Extremity (N/A) as a surgical intervention .  The patient's history has been reviewed, patient examined, no change in status, stable for surgery.  I have reviewed the patient's chart and labs.  Questions were answered to the patient's satisfaction.     Ruta Hinds

## 2015-12-31 ENCOUNTER — Telehealth: Payer: Self-pay | Admitting: Vascular Surgery

## 2015-12-31 ENCOUNTER — Encounter (HOSPITAL_COMMUNITY): Payer: Self-pay | Admitting: Vascular Surgery

## 2015-12-31 NOTE — Telephone Encounter (Signed)
-----   Message from Mena Goes, RN sent at 12/30/2015  1:02 PM EDT ----- Regarding: schedule Colletta [place] is aware of this also.  ----- Message -----    From: [name], MD    Sent: 12/30/2015  12:36 PM      To: [facility] Charge Pool  Aortogram with left leg runoff.  [place] left groin  He needs cardiology eval preop.  He also needs left leg vein map.  He then needs left femoral to posterior tibial bypass  Ruta Hinds

## 2015-12-31 NOTE — Telephone Encounter (Signed)
sched vein mapping for 01/01/16 at 2:30. Sched cardiology appt with Dr. Percival Spanish at [place] on 01/02/16 at 2.  Spoke to pt's wife to inform pt of locations and appts.

## 2016-01-01 ENCOUNTER — Ambulatory Visit (HOSPITAL_COMMUNITY)
Admission: RE | Admit: 2016-01-01 | Discharge: 2016-01-01 | Disposition: A | Discharge status: Home / Self Care | Payer: Medicare Other | Source: Ambulatory Visit | Attending: Vascular Surgery | Admitting: Vascular Surgery

## 2016-01-01 DIAGNOSIS — I739 Peripheral vascular disease, unspecified: Secondary | ICD-10-CM | POA: Insufficient documentation

## 2016-01-01 DIAGNOSIS — Z01810 Encounter for preprocedural cardiovascular examination: Secondary | ICD-10-CM | POA: Diagnosis not present

## 2016-01-02 ENCOUNTER — Ambulatory Visit: Payer: Medicare Other | Admitting: Cardiology

## 2016-01-02 ENCOUNTER — Other Ambulatory Visit: Payer: Self-pay

## 2016-01-06 DIAGNOSIS — G546 Phantom limb syndrome with pain: Secondary | ICD-10-CM | POA: Diagnosis not present

## 2016-01-06 DIAGNOSIS — Z89511 Acquired absence of right leg below knee: Secondary | ICD-10-CM | POA: Diagnosis not present

## 2016-01-06 DIAGNOSIS — Z1389 Encounter for screening for other disorder: Secondary | ICD-10-CM | POA: Diagnosis not present

## 2016-01-26 ENCOUNTER — Encounter (HOSPITAL_COMMUNITY): Payer: Self-pay

## 2016-01-26 ENCOUNTER — Encounter (HOSPITAL_COMMUNITY)
Admission: RE | Admit: 2016-01-26 | Discharge: 2016-01-26 | Disposition: A | Discharge status: Home / Self Care | Payer: Medicare Other | Source: Ambulatory Visit | Attending: Vascular Surgery | Admitting: Vascular Surgery

## 2016-01-26 DIAGNOSIS — I96 Gangrene, not elsewhere classified: Secondary | ICD-10-CM | POA: Diagnosis not present

## 2016-01-26 DIAGNOSIS — Z0183 Encounter for blood typing: Secondary | ICD-10-CM | POA: Insufficient documentation

## 2016-01-26 DIAGNOSIS — Z01812 Encounter for preprocedural laboratory examination: Secondary | ICD-10-CM | POA: Diagnosis not present

## 2016-01-26 LAB — COMPREHENSIVE METABOLIC PANEL
ALBUMIN: 4.2 g/dL (ref 3.5–5.0)
ALT: 21 U/L (ref 17–63)
ANION GAP: 10 (ref 5–15)
AST: 25 U/L (ref 15–41)
Alkaline Phosphatase: 67 U/L (ref 38–126)
BUN: 16 mg/dL (ref 6–20)
CHLORIDE: 104 mmol/L (ref 101–111)
CO2: 24 mmol/L (ref 22–32)
Calcium: 9.2 mg/dL (ref 8.9–10.3)
Creatinine, Ser: 1.09 mg/dL (ref 0.61–1.24)
GFR calc Af Amer: >60 mL/min (ref >60)
GFR calc non Af Amer: >60 mL/min (ref >60)
GLUCOSE: 86 mg/dL (ref 65–99)
POTASSIUM: 4.7 mmol/L (ref 3.5–5.1)
SODIUM: 138 mmol/L (ref 135–145)
Total Bilirubin: 0.3 mg/dL (ref 0.3–1.2)
Total Protein: 7 g/dL (ref 6.5–8.1)

## 2016-01-26 LAB — CBC
HCT: 48.4 % (ref 39.0–52.0)
Hemoglobin: 15.8 g/dL (ref 13.0–17.0)
MCH: 30.3 pg (ref 26.0–34.0)
MCHC: 32.6 g/dL (ref 30.0–36.0)
MCV: 92.7 fL (ref 78.0–100.0)
PLATELETS: 301 10*3/uL (ref 150–400)
RBC: 5.22 MIL/uL (ref 4.22–5.81)
RDW: 14.3 % (ref 11.5–15.5)
WBC: 8.5 10*3/uL (ref 4.0–10.5)

## 2016-01-26 LAB — URINALYSIS, ROUTINE W REFLEX MICROSCOPIC
BILIRUBIN URINE: NEGATIVE
Glucose, UA: NEGATIVE mg/dL
HGB URINE DIPSTICK: NEGATIVE
KETONES UR: NEGATIVE mg/dL
Leukocytes, UA: NEGATIVE
NITRITE: NEGATIVE
PH: 7 (ref 5.0–8.0)
Protein, ur: NEGATIVE mg/dL
Specific Gravity, Urine: 1.011 (ref 1.005–1.030)

## 2016-01-26 LAB — SURGICAL PCR SCREEN
MRSA, PCR: NEGATIVE
STAPHYLOCOCCUS AUREUS: NEGATIVE

## 2016-01-26 LAB — TYPE AND SCREEN
ABO/RH(D): A POS
ANTIBODY SCREEN: NEGATIVE

## 2016-01-26 LAB — APTT: aPTT: 35 seconds (ref 24–37)

## 2016-01-26 LAB — PROTIME-INR
INR: 1.04 (ref 0.00–1.49)
Prothrombin Time: 13.8 seconds (ref 11.6–15.2)

## 2016-01-26 NOTE — Pre-Procedure Instructions (Signed)
[name]  01/26/2016      [id number] [name]  Your procedure is scheduled on Monday, May 8th   [facility]             (posted surgery time 7:30 am - 1: 27 pm)   Call this number if you have problems the morning  of surgery:  [phone]   Remember:  Do not eat food or drink liquids after midnight Sunday.   Take these medicines the morning of surgery with A SIP OF WATER :  Xanax, Celexa, Hydrocodone             (4-5 days prior to surgery, STOP taking any vitamins, herbal supplement, blood thinners, anti-inflammatories)   Do not wear jewelry - no rings or watches.  Do not wear lotions or colognes.                 Men may shave face and neck.  Do not bring valuables to the hospital.  [facility] not responsible for any belongings or valuables.  Contacts, dentures or bridgework may not be worn into surgery.  Leave your suitcase in the car.  After surgery it may be brought to your room. For patients admitted to the hospital, discharge time will be determined by your treatment team.  Name and phone number of your driver:     Please read over the following fact sheets that you were given. Pain Booklet, Coughing and Deep Breathing, Blood Transfusion Information, MRSA Information and Surgical Site Infection Prevention

## 2016-01-26 NOTE — Progress Notes (Addendum)
Going to see Dr. [name] 5/3 for new patient visit. And has been instructed to keep taking his baby aspirin, even on the day of surgery.

## 2016-01-28 ENCOUNTER — Encounter: Payer: Self-pay | Admitting: Cardiovascular Disease

## 2016-01-28 ENCOUNTER — Ambulatory Visit (INDEPENDENT_AMBULATORY_CARE_PROVIDER_SITE_OTHER): Payer: Medicare Other | Admitting: Cardiovascular Disease

## 2016-01-28 ENCOUNTER — Telehealth: Payer: Self-pay

## 2016-01-28 VITALS — BP 128/85 | HR 84 | Ht 72.0 in | Wt 233.2 lb

## 2016-01-28 DIAGNOSIS — I739 Peripheral vascular disease, unspecified: Secondary | ICD-10-CM | POA: Diagnosis not present

## 2016-01-28 DIAGNOSIS — Z720 Tobacco use: Secondary | ICD-10-CM

## 2016-01-28 DIAGNOSIS — F17210 Nicotine dependence, cigarettes, uncomplicated: Secondary | ICD-10-CM | POA: Diagnosis not present

## 2016-01-28 DIAGNOSIS — E785 Hyperlipidemia, unspecified: Secondary | ICD-10-CM

## 2016-01-28 HISTORY — DX: Tobacco use: Z72.0

## 2016-01-28 NOTE — Progress Notes (Signed)
Cardiology Office Note   Date:  01/28/2016   ID:  [name], DOB 1960-11-07, MRN [id number]  PCP:  Glo Herring., MD  Cardiologist:   [name], MD  Vascular Surgeon: Ruta Hinds, M.D.  Chief Complaint  Patient presents with  . New Patient (Initial Visit)    SOB;none. CHEST PAIN;none. LIGHTHEADED/DIZZINESS;none. PAIN OR CRAMPING IN LEGS;in right leg. EDEMA; none.      History of Present Illness: [name] is a 55 y.o. male with peripheral arterial disease status post right femoral-tibial bypass followed by BKA, ongoing tobacco abuse, and chronic hepatitis C virus who presents for presurgical risk assessment and management of PAD.  Two weeks ago [name] noted that the toes on his left foot were turning purple. He also felt mild stinging. He feels as though the toes are always cold.  On 3/30 he presented to Dr. Oneida Alar office and was referred for an abdominal aortogram.  This was performed on 12/30/15 and demonstrated a patent aortoiliac system on the left with a patent left common iliac stent. He had occlusion of the right internal iliac artery, the left superficial femoral and popliteal arteries.  He had 2 vessel runoff of the L foot posterior tibial and anterior tibial with diminutive peroneal.  This procedure was performed due to gangrene of the left foot.  He is scheduled for bypass grafting from the femoral to posterior tibial artery on the left on 02/02/16.  [name]  does not get any formal exercise. He is able to walk nearly half a mile without stopping. He does not get any chest pain or shortness of breath with this activity. He does not typically walks upstairs.  He denies any left lower extremity edema but does get swelling in his stump when his prosthesis is not on. He uses a stump shrinker at home.  He notes pain at the prosthesis site when walking for prolonged periods of time.    [name] continues to smoke 1/2 pack of cigarettes daily.  He has been smoking for over  30 years. Previously tried using nicotine replacement and vaporizer cigarettes. She has never been able to sit quit for any sustained period of time. He is interested in trying at this time.  [name] was successfully treated for Hepatitis C with Harvoni.  Past Medical History  Diagnosis Date  . Anxiety   . Asthma   . Depression   . DVT (deep venous thrombosis) ([place]) 2009  . Hepatitis C     no prior treatment  -   did treatment 2015  . Tobacco abuse 01/28/2016    Past Surgical History  Procedure Laterality Date  . Femoral-tibial bypass graft  2009    Right side using non-reversed GSV   By Dr. Oneida Alar  . Femoral-tibial bypass graft  11/24/2007    Right femoral to anterior tibial BPG   by Dr. Oneida Alar  . 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 Aortogram  08/08/2009    for LLE claudication     By Dr. Oneida Alar  . Cholecystectomy    . Bladder tumor excision  8/812  . Hand tendon surgery Left 2013    Left 5th finger  . Colonoscopy with propofol N/A 05/30/2014    OFH:QRFXJO colonic polyp-likely source of hematochezia-removed as described above  . Esophagogastroduodenoscopy (egd) with propofol N/A 05/30/2014    ITG:PQDIYM and bulbar erosions s/p gastric biopsy. No evidence of portal gastropathy on today's examination.  [name]  Biopsy N/A 05/30/2014    Procedure: BIOPSY;  Surgeon: [name], MD;  Location: [facility];  Service: Endoscopy;  Laterality: N/A;  . Polypectomy  05/30/2014    Procedure: POLYPECTOMY;  Surgeon: [name], MD;  Location: [facility];  Service: Endoscopy;;  . Incisional hernia repair N/A 12/25/2014    Procedure: HERNIA REPAIR INCISIONAL WITH MESH;  Surgeon: [name], MD;  Location: [facility];  Service: General;  Laterality: N/A;  . Insertion of mesh N/A 12/25/2014    Procedure: INSERTION OF MESH;  Surgeon: [name], MD;  Location: [facility];  Service: General;  Laterality: N/A;  . Peripheral vascular catheterization N/A 12/30/2015    Procedure: Abdominal Aortogram;   Surgeon: [name], MD;  Location: [place] [facility];  Service: Cardiovascular;  Laterality: N/A;  . Lower extremity angiogram Left 12/30/2015    Procedure: Lower Extremity Angiogram;  Surgeon: [name], MD;  Location: [place] [facility];  Service: Cardiovascular;  Laterality: Left;  . Tonsillectomy    . Hernia repair       Current Outpatient Prescriptions  Medication Sig Dispense Refill  . alprazolam (XANAX) 2 MG tablet Take 2 mg by mouth 4 (four) times daily as needed for anxiety.     [name] amitriptyline (ELAVIL) 50 MG tablet Take 50 mg by mouth at bedtime.    [name] aspirin 81 MG EC tablet Take 81 mg by mouth daily.      . citalopram (CELEXA) 40 MG tablet Take 40 mg by mouth daily.  11  . HYDROcodone-acetaminophen (NORCO) 10-325 MG per tablet Take 1 tablet by mouth every 4 (four) hours as needed for moderate pain. pain 50 tablet 0   No current facility-administered medications for this visit.    Allergies:   Lyrica and Neurontin    Social History:  The patient  reports that he has been smoking Cigarettes.  He has a 43.5 pack-year smoking history. He has never used smokeless tobacco. He reports that he uses illicit drugs (Marijuana). He reports that he does not drink alcohol.   Family History:  The patient's family history includes Ulcers in his father; Vision loss in his mother. There is no history of Colon cancer.    ROS:  Please see the history of present illness.   Otherwise, review of systems are positive for none.   All other systems are reviewed and negative.    PHYSICAL EXAM: VS:  BP 128/85 mmHg  Pulse 84  Ht 6' (1.829 m)  Wt 105.779 kg (233 lb 3.2 oz)  BMI 31.62 kg/m2 , BMI Body mass index is 31.62 kg/(m^2). GENERAL:  Well appearing HEENT:  Pupils equal round and reactive, fundi not visualized, oral mucosa unremarkable NECK:  No jugular venous distention, waveform within normal limits, carotid upstroke brisk and symmetric, no bruits, no thyromegaly LYMPHATICS:   No cervical adenopathy LUNGS:  Clear to auscultation bilaterally HEART:  RRR.  PMI not displaced or sustained,S1 and S2 within normal limits, no S3, no S4, no clicks, no rubs, no murmurs ABD:  Flat, positive bowel sounds normal in frequency in pitch, no bruits, no rebound, no guarding, no midline pulsatile mass, no hepatomegaly, no splenomegaly EXT:  1+DP.  TP not palpable.  No edema.  R BKA.  L toes cyanotic with poor capillary refill. SKIN:  No rashes no nodules NEURO:  Cranial nerves II through XII grossly intact, motor grossly intact throughout PSYCH:  Cognitively intact, oriented to person place and time  EKG:  EKG is not ordered today. The ekg ordered 4/4/17demonstrates sinus rhythm. Rate 74 bpm. Low voltage limb leads.   Recent Labs: 01/26/2016: ALT 21; BUN 16; Creatinine, Ser 1.09; Hemoglobin 15.8; Platelets 301; Potassium 4.7; Sodium 138   Lipid Panel    Component Value Date/Time   CHOL 146 05/30/2007 2313   TRIG 47 05/30/2007 2313   HDL 58 05/30/2007 2313   CHOLHDL 2.5 Ratio 05/30/2007 2313   VLDL 9 05/30/2007 2313   LDLCALC 79 05/30/2007 2313      Wt Readings from Last 3 Encounters:  01/28/16 105.779 kg (233 lb 3.2 oz)  01/26/16 104.962 kg (231 lb 6.4 oz)  12/30/15 99.791 kg (220 lb)      ASSESSMENT AND PLAN:  # Presurgical risk assessment: The patient does not have any unstable cardiac conditions.  Upon evaluation today, he can achieve 4 METs or greater without anginal symptoms.  According to [facility] and AHA guidelines, he requires no further cardiac workup prior to his noncardiac surgery and should be at acceptable risk.  his NSQIP risk of peri-procedural MI or cardiac arrest is 0.51%.  Our service is available as necessary in the perioperative period.  # PAD: [name] has severe PAD and has already had an amputation of the R LE.He is undergoing femoral-tibial bypass for CLI on the left.  We discussed the importance of smoking cessation in his pleurisy to heal from this  procedure. He thinks that he had lipids checked with his PCP. We will obtain these records. If he has not had lipids checked recently and he will return to have them checked. Ideally, he would be on a statin. It seems as though his Hepatitis C is now in remission after treatment with Harvoni.  Continue aspirin and likely start a statin.  # Tobacco abuse: 5 minutes of smoking cessation counseling.  He will start nicotine patches. He will use 21 mg for 6 weeks followed by 40 mg for 2 weeks followed by 7 mg for 2 weeks.    Current medicines are reviewed at length with the patient today.  The patient does not have concerns regarding medicines.  The following changes have been made:  no change  Labs/ tests ordered today include:   No orders of the defined types were placed in this encounter.     Disposition:   FU with Raimundo Corbit C. Oval Linsey, MD, [facility] in 3 months.     This note was written with the assistance of speech recognition software.  Please excuse any transcriptional errors.  Signed, Josef Tourigny C. Oval Linsey, MD, [facility]  01/28/2016 6:33 PM    [place]

## 2016-01-28 NOTE — Patient Instructions (Signed)
Medication Instructions:  Your physician has recommended you make the following change in your medication:  Your physician recommends that you start using an over the counter Nicotine patch:   Wear the: 21 mg patch for 6 weeks; 14 mg patch for 2 weeks; and 7 mg patch for 2 weeks.   Labwork: Your physician recommends that you return for lab work in: FASTING (Lipid/Liver) The lab can be found on the FIRST FLOOR of out building in [address]   Testing/Procedures: none  Follow-Up: Your physician recommends that you schedule a follow-up appointment in: 3 months with Dr. Oval Linsey   Any Other Special Instructions Will Be Listed Below (If Applicable).     If you need a refill on your cardiac medications before your next appointment, please call your pharmacy.

## 2016-01-28 NOTE — Telephone Encounter (Signed)
Pt contacted and made aware that he dose not need to get repeat Lipid/Liver and results were received from his PCP. Pt verified understanding, no additional questions at this time.

## 2016-02-01 ENCOUNTER — Inpatient Hospital Stay (HOSPITAL_COMMUNITY): Payer: Medicare Other | Admitting: Certified Registered Nurse Anesthetist

## 2016-02-01 NOTE — Anesthesia Preprocedure Evaluation (Deleted)
Anesthesia Evaluation  Patient identified by MRN, date of birth, ID band Patient awake    Reviewed: Allergy & Precautions, H&P , NPO status , Patient's Chart, lab work & pertinent test results  Airway Mallampati: II  TM Distance: >3 FB Neck ROM: Full    Dental no notable dental hx. (+) Edentulous Upper, Edentulous Lower, Dental Advisory Given   Pulmonary asthma , Current Smoker,    Pulmonary exam normal breath sounds clear to auscultation       Cardiovascular + Peripheral Vascular Disease   Rhythm:Regular Rate:Normal     Neuro/Psych Anxiety Depression negative neurological ROS     GI/Hepatic negative GI ROS, Neg liver ROS,   Endo/Other  negative endocrine ROS  Renal/GU negative Renal ROS  negative genitourinary   Musculoskeletal   Abdominal   Peds  Hematology negative hematology ROS (+)   Anesthesia Other Findings   Reproductive/Obstetrics negative OB ROS                            Anesthesia Physical Anesthesia Plan  ASA: III  Anesthesia Plan: General   Post-op Pain Management:    Induction: Intravenous  Airway Management Planned: Oral ETT  Additional Equipment:   Intra-op Plan:   Post-operative Plan: Extubation in OR  Informed Consent: I have reviewed the patients History and Physical, chart, labs and discussed the procedure including the risks, benefits and alternatives for the proposed anesthesia with the patient or authorized representative who has indicated his/her understanding and acceptance.   Dental advisory given  Plan Discussed with: CRNA  Anesthesia Plan Comments:         Anesthesia Quick Evaluation

## 2016-02-02 ENCOUNTER — Ambulatory Visit (HOSPITAL_COMMUNITY)
Admission: RE | Admit: 2016-02-02 | Discharge: 2016-02-02 | Disposition: A | Discharge status: Home / Self Care | Payer: Medicare Other | Source: Ambulatory Visit | Attending: Vascular Surgery | Admitting: Vascular Surgery

## 2016-02-02 ENCOUNTER — Telehealth: Payer: Self-pay | Admitting: Vascular Surgery

## 2016-02-02 ENCOUNTER — Encounter (HOSPITAL_COMMUNITY)
Admission: RE | Disposition: A | Discharge status: Home / Self Care | Payer: Self-pay | Source: Ambulatory Visit | Attending: Vascular Surgery

## 2016-02-02 DIAGNOSIS — I739 Peripheral vascular disease, unspecified: Secondary | ICD-10-CM | POA: Insufficient documentation

## 2016-02-02 DIAGNOSIS — F17200 Nicotine dependence, unspecified, uncomplicated: Secondary | ICD-10-CM | POA: Diagnosis not present

## 2016-02-02 DIAGNOSIS — F419 Anxiety disorder, unspecified: Secondary | ICD-10-CM | POA: Insufficient documentation

## 2016-02-02 DIAGNOSIS — Z538 Procedure and treatment not carried out for other reasons: Secondary | ICD-10-CM | POA: Diagnosis not present

## 2016-02-02 DIAGNOSIS — F329 Major depressive disorder, single episode, unspecified: Secondary | ICD-10-CM | POA: Diagnosis not present

## 2016-02-02 DIAGNOSIS — J45909 Unspecified asthma, uncomplicated: Secondary | ICD-10-CM | POA: Insufficient documentation

## 2016-02-02 SURGERY — CREATION, BYPASS, ARTERIAL, FEMORAL TO TIBIAL, USING GRAFT
Anesthesia: General

## 2016-02-02 MED ORDER — FENTANYL CITRATE (PF) 250 MCG/5ML IJ SOLN
INTRAMUSCULAR | Status: AC
  Filled 2016-02-02: qty 5

## 2016-02-02 MED ORDER — ROCURONIUM BROMIDE 50 MG/5ML IV SOLN
INTRAVENOUS | Status: AC
  Filled 2016-02-02: qty 1

## 2016-02-02 MED ORDER — CHLORHEXIDINE GLUCONATE CLOTH 2 % EX PADS: 6.0000 | MEDICATED_PAD | Freq: Once | CUTANEOUS | Status: DC

## 2016-02-02 MED ORDER — MIDAZOLAM HCL 2 MG/2ML IJ SOLN
INTRAMUSCULAR | Status: AC
Start: 2016-02-02 — End: 2016-02-02
  Filled 2016-02-02: qty 2

## 2016-02-02 MED ORDER — PROPOFOL 10 MG/ML IV BOLUS
INTRAVENOUS | Status: AC
  Filled 2016-02-02: qty 40

## 2016-02-02 MED ORDER — SODIUM CHLORIDE 0.9 % IV SOLN: INTRAVENOUS | Status: DC

## 2016-02-02 MED ORDER — DEXTROSE 5 % IV SOLN
1.5000 g | INTRAVENOUS | Status: DC
  Filled 2016-02-02: qty 1.5

## 2016-02-02 MED ORDER — ONDANSETRON HCL 4 MG/2ML IJ SOLN
INTRAMUSCULAR | Status: AC
  Filled 2016-02-02: qty 2

## 2016-02-02 MED ORDER — ARTIFICIAL TEARS OP OINT
TOPICAL_OINTMENT | OPHTHALMIC | Status: AC
  Filled 2016-02-02: qty 3.5

## 2016-02-02 NOTE — Telephone Encounter (Signed)
sched appt 03/04/16 at 9:45. Spoke to pt's wife to inform them of appt.

## 2016-02-02 NOTE — Telephone Encounter (Signed)
-----   Message from Denman George, RN sent at 02/02/2016 10:09 AM EDT ----- Regarding: needs 1 mo f/u with Dr. Oneida Alar   ----- Message -----    From: [name], MD    Sent: 02/02/2016   7:34 AM      To: Vvs Charge Pool  Pt operation cancelled today.  Foot has healed.  Please arrange for him to see me back in follow up in a month.  No studies needed at that time.  Ruta Hinds

## 2016-02-02 NOTE — Progress Notes (Signed)
Surgery has been canceled per Dr Oneida Alar. Pt very happy. Dressing to go home now.  Instructed to return to Dr Oneida Alar' office in 3-4 weeks with stated understanding. IV that was placed by anesthesia was d/c'd intact. Site unremarkable.

## 2016-02-02 NOTE — Progress Notes (Signed)
Pt area of toe has essentially healed at this point.  He has no rest pain.  He has a so so conduit so I believe we should cancel his tibial bypass today and continue watchful waiting at this point  I will schedule him to see me back in 3-4 weeks.  He will continue to try to quit smoking and will think about whether or not he wants to try Chantix.  Ruta Hinds, MD [facility] Office: [phone] Pager: [phone]

## 2016-02-27 ENCOUNTER — Encounter: Payer: Self-pay | Admitting: Vascular Surgery

## 2016-03-03 ENCOUNTER — Telehealth: Payer: Self-pay | Admitting: Internal Medicine

## 2016-03-03 NOTE — Telephone Encounter (Signed)
Letter mailed

## 2016-03-03 NOTE — Telephone Encounter (Signed)
July recall for ultrasound

## 2016-03-04 ENCOUNTER — Encounter: Payer: Self-pay | Admitting: Vascular Surgery

## 2016-03-04 ENCOUNTER — Ambulatory Visit (INDEPENDENT_AMBULATORY_CARE_PROVIDER_SITE_OTHER): Payer: Medicare Other | Admitting: Vascular Surgery

## 2016-03-04 VITALS — BP 131/85 | HR 78 | Temp 97.1°F | Resp 16 | Ht 72.0 in | Wt 235.0 lb

## 2016-03-04 DIAGNOSIS — I739 Peripheral vascular disease, unspecified: Secondary | ICD-10-CM

## 2016-03-04 NOTE — Progress Notes (Signed)
Patient is a 55 year old male who returns for follow-up today. He has previously undergone multiple revascularizations in the right leg and ultimately ended up with a right below-knee amputation. Recently he had a nonhealing wound on his left foot. We had him on the operating room schedule to do a left femoral to tibial artery bypass. However when he showed up in the preoperative holding area his left foot had healed completely. He denies any rest pain. He denies any wounds on his foot currently. Unfortunately he continues to smoke but is trying to cut back on this.  Review of systems: He has shortness of breath with exertion. He denies chest pain.  Physical exam  Filed Vitals:   03/04/16 1010  BP: 131/85  Pulse: 78  Temp: 97.1 F (36.2 C)  TempSrc: Oral  Resp: 16  Height: 6' (1.829 m)  Weight: 235 lb (106.595 kg)  SpO2: 99%   Extremities: Left foot no open wounds no skin cracks no ulcerations foot is warm and pink  Assessment: Severe peripheral arterial disease left lower extremity but currently all wounds of healed and he has no rest pain.  Plan: The patient will follow-up with repeat ABIs in 6 months time. He'll follow-up sooner if he develops any wound.  Ruta Hinds, MD [facility] Office: [phone] Pager: [phone]

## 2016-03-08 DIAGNOSIS — I7389 Other specified peripheral vascular diseases: Secondary | ICD-10-CM | POA: Diagnosis not present

## 2016-03-08 DIAGNOSIS — G546 Phantom limb syndrome with pain: Secondary | ICD-10-CM | POA: Diagnosis not present

## 2016-03-08 DIAGNOSIS — G894 Chronic pain syndrome: Secondary | ICD-10-CM | POA: Diagnosis not present

## 2016-05-04 ENCOUNTER — Ambulatory Visit: Payer: Medicare Other | Admitting: Cardiovascular Disease

## 2016-05-26 ENCOUNTER — Inpatient Hospital Stay (HOSPITAL_COMMUNITY): Payer: Medicare Other

## 2016-05-26 ENCOUNTER — Encounter (HOSPITAL_COMMUNITY): Payer: Self-pay | Admitting: Emergency Medicine

## 2016-05-26 ENCOUNTER — Inpatient Hospital Stay (HOSPITAL_COMMUNITY)
Admission: EM | Admit: 2016-05-26 | Discharge: 2016-05-28 | DRG: 246 | Disposition: A | Discharge status: Home / Self Care | Payer: Medicare Other | Attending: Cardiovascular Disease | Admitting: Cardiovascular Disease

## 2016-05-26 ENCOUNTER — Inpatient Hospital Stay (HOSPITAL_COMMUNITY)
Admission: EM | Disposition: A | Discharge status: Home / Self Care | Payer: Self-pay | Source: Home / Self Care | Attending: Cardiovascular Disease

## 2016-05-26 ENCOUNTER — Ambulatory Visit (HOSPITAL_COMMUNITY): Admit: 2016-05-26 | Payer: Medicare Other | Admitting: Cardiovascular Disease

## 2016-05-26 ENCOUNTER — Other Ambulatory Visit (HOSPITAL_COMMUNITY): Payer: Medicare Other

## 2016-05-26 DIAGNOSIS — I959 Hypotension, unspecified: Secondary | ICD-10-CM | POA: Diagnosis not present

## 2016-05-26 DIAGNOSIS — E785 Hyperlipidemia, unspecified: Secondary | ICD-10-CM | POA: Diagnosis not present

## 2016-05-26 DIAGNOSIS — Z955 Presence of coronary angioplasty implant and graft: Secondary | ICD-10-CM | POA: Diagnosis not present

## 2016-05-26 DIAGNOSIS — Z86718 Personal history of other venous thrombosis and embolism: Secondary | ICD-10-CM | POA: Diagnosis not present

## 2016-05-26 DIAGNOSIS — I5021 Acute systolic (congestive) heart failure: Secondary | ICD-10-CM | POA: Diagnosis present

## 2016-05-26 DIAGNOSIS — Z888 Allergy status to other drugs, medicaments and biological substances status: Secondary | ICD-10-CM

## 2016-05-26 DIAGNOSIS — Z89511 Acquired absence of right leg below knee: Secondary | ICD-10-CM

## 2016-05-26 DIAGNOSIS — I213 ST elevation (STEMI) myocardial infarction of unspecified site: Secondary | ICD-10-CM | POA: Diagnosis not present

## 2016-05-26 DIAGNOSIS — Z7982 Long term (current) use of aspirin: Secondary | ICD-10-CM

## 2016-05-26 DIAGNOSIS — I739 Peripheral vascular disease, unspecified: Secondary | ICD-10-CM | POA: Diagnosis not present

## 2016-05-26 DIAGNOSIS — I2510 Atherosclerotic heart disease of native coronary artery without angina pectoris: Secondary | ICD-10-CM

## 2016-05-26 DIAGNOSIS — Z23 Encounter for immunization: Secondary | ICD-10-CM | POA: Diagnosis not present

## 2016-05-26 DIAGNOSIS — J45909 Unspecified asthma, uncomplicated: Secondary | ICD-10-CM | POA: Diagnosis present

## 2016-05-26 DIAGNOSIS — F329 Major depressive disorder, single episode, unspecified: Secondary | ICD-10-CM | POA: Diagnosis present

## 2016-05-26 DIAGNOSIS — I2102 ST elevation (STEMI) myocardial infarction involving left anterior descending coronary artery: Secondary | ICD-10-CM | POA: Diagnosis not present

## 2016-05-26 DIAGNOSIS — I2129 ST elevation (STEMI) myocardial infarction involving other sites: Secondary | ICD-10-CM | POA: Diagnosis present

## 2016-05-26 DIAGNOSIS — G4700 Insomnia, unspecified: Secondary | ICD-10-CM | POA: Diagnosis not present

## 2016-05-26 DIAGNOSIS — Z79899 Other long term (current) drug therapy: Secondary | ICD-10-CM

## 2016-05-26 DIAGNOSIS — F17210 Nicotine dependence, cigarettes, uncomplicated: Secondary | ICD-10-CM | POA: Diagnosis present

## 2016-05-26 DIAGNOSIS — F419 Anxiety disorder, unspecified: Secondary | ICD-10-CM | POA: Diagnosis present

## 2016-05-26 HISTORY — DX: ST elevation (STEMI) myocardial infarction of unspecified site: I21.3

## 2016-05-26 HISTORY — DX: ST elevation (STEMI) myocardial infarction involving other sites: I21.29

## 2016-05-26 HISTORY — PX: CARDIAC CATHETERIZATION: SHX172

## 2016-05-26 HISTORY — DX: ST elevation (STEMI) myocardial infarction involving left anterior descending coronary artery: I21.02

## 2016-05-26 LAB — COMPREHENSIVE METABOLIC PANEL
ALK PHOS: 72 U/L (ref 38–126)
ALT: 20 U/L (ref 17–63)
ANION GAP: 13 (ref 5–15)
AST: 21 U/L (ref 15–41)
Albumin: 4 g/dL (ref 3.5–5.0)
BUN: 11 mg/dL (ref 6–20)
CALCIUM: 8.9 mg/dL (ref 8.9–10.3)
CO2: 24 mmol/L (ref 22–32)
CREATININE: 1.14 mg/dL (ref 0.61–1.24)
Chloride: 97 mmol/L — ABNORMAL LOW (ref 101–111)
Glucose, Bld: 162 mg/dL — ABNORMAL HIGH (ref 65–99)
Potassium: 3.4 mmol/L — ABNORMAL LOW (ref 3.5–5.1)
SODIUM: 134 mmol/L — AB (ref 135–145)
Total Bilirubin: 0.6 mg/dL (ref 0.3–1.2)
Total Protein: 6.7 g/dL (ref 6.5–8.1)

## 2016-05-26 LAB — BASIC METABOLIC PANEL
ANION GAP: 8 (ref 5–15)
BUN: 11 mg/dL (ref 6–20)
CALCIUM: 8.8 mg/dL — AB (ref 8.9–10.3)
CO2: 29 mmol/L (ref 22–32)
Chloride: 98 mmol/L — ABNORMAL LOW (ref 101–111)
Creatinine, Ser: 1.15 mg/dL (ref 0.61–1.24)
GFR calc Af Amer: >60 mL/min (ref >60)
Glucose, Bld: 133 mg/dL — ABNORMAL HIGH (ref 65–99)
POTASSIUM: 3.8 mmol/L (ref 3.5–5.1)
SODIUM: 135 mmol/L (ref 135–145)

## 2016-05-26 LAB — ECHOCARDIOGRAM COMPLETE
E decel time: 162 msec
EERAT: 10.51
FS: 13 % — AB (ref 28–44)
HEIGHTINCHES: 72 in
IVS/LV PW RATIO, ED: 1.16
LA ID, A-P, ES: 35 mm
LA diam end sys: 35 mm
LA vol A4C: 27.4 ml
LA vol index: 16.4 mL/m2
LADIAMINDEX: 1.54 cm/m2
LAVOL: 37.3 mL
LDCA: 3.14 cm2
LV E/e' medial: 10.51
LV E/e'average: 10.51
LV TDI E'LATERAL: 6.64
LVELAT: 6.64 cm/s
LVOT VTI: 16.9 cm
LVOT diameter: 20 mm
LVOTPV: 98.3 cm/s
LVOTSV: 53 mL
MV Dec: 162
MV pk A vel: 98.5 m/s
MV pk E vel: 69.8 m/s
PW: 9.85 mm — AB (ref 0.6–1.1)
TDI e' medial: 6.42
WEIGHTICAEL: 3742.53 [oz_av]

## 2016-05-26 LAB — DIFFERENTIAL
BASOS ABS: 0.1 10*3/uL (ref 0.0–0.1)
BASOS PCT: 0 %
EOS ABS: 0.4 10*3/uL (ref 0.0–0.7)
Eosinophils Relative: 2 %
Lymphocytes Relative: 26 %
Lymphs Abs: 4.9 10*3/uL — ABNORMAL HIGH (ref 0.7–4.0)
MONO ABS: 1.1 10*3/uL — AB (ref 0.1–1.0)
Monocytes Relative: 6 %
NEUTROS ABS: 12.3 10*3/uL — AB (ref 1.7–7.7)
Neutrophils Relative %: 66 %

## 2016-05-26 LAB — CARDIAC CATHETERIZATION: CATHEFQUANT: 40 %

## 2016-05-26 LAB — POCT I-STAT, CHEM 8
BUN: 14 mg/dL (ref 6–20)
Calcium, Ion: 0.97 mmol/L — ABNORMAL LOW (ref 1.15–1.40)
Chloride: 99 mmol/L — ABNORMAL LOW (ref 101–111)
Creatinine, Ser: 1.1 mg/dL (ref 0.61–1.24)
Glucose, Bld: 160 mg/dL — ABNORMAL HIGH (ref 65–99)
HEMATOCRIT: 50 % (ref 39.0–52.0)
HEMOGLOBIN: 17 g/dL (ref 13.0–17.0)
POTASSIUM: 3.2 mmol/L — AB (ref 3.5–5.1)
SODIUM: 134 mmol/L — AB (ref 135–145)
TCO2: 24 mmol/L (ref 0–100)

## 2016-05-26 LAB — HEPATIC FUNCTION PANEL
ALT: 48 U/L (ref 17–63)
AST: 265 U/L — AB (ref 15–41)
Albumin: 3.8 g/dL (ref 3.5–5.0)
Alkaline Phosphatase: 71 U/L (ref 38–126)
BILIRUBIN TOTAL: 0.4 mg/dL (ref 0.3–1.2)
Total Protein: 6.4 g/dL — ABNORMAL LOW (ref 6.5–8.1)

## 2016-05-26 LAB — LIPID PANEL
CHOLESTEROL: 166 mg/dL (ref 0–200)
Cholesterol: 154 mg/dL (ref 0–200)
HDL: 42 mg/dL (ref >40)
HDL: 44 mg/dL (ref >40)
LDL CALC: 75 mg/dL (ref 0–99)
LDL Cholesterol: 96 mg/dL (ref 0–99)
TRIGLYCERIDES: 138 mg/dL (ref <150)
Total CHOL/HDL Ratio: 4 RATIO
Total CHOL/HDL Ratio: 3.5 RATIO
Triglycerides: 177 mg/dL — ABNORMAL HIGH (ref <150)
VLDL: 28 mg/dL (ref 0–40)
VLDL: 35 mg/dL (ref 0–40)

## 2016-05-26 LAB — PROTIME-INR
INR: 1.07
INR: 2.49
PROTHROMBIN TIME: 14 s (ref 11.4–15.2)
PROTHROMBIN TIME: 27.4 s — AB (ref 11.4–15.2)

## 2016-05-26 LAB — CBC
HCT: 47.6 % (ref 39.0–52.0)
Hemoglobin: 15.9 g/dL (ref 13.0–17.0)
MCH: 31.5 pg (ref 26.0–34.0)
MCHC: 33.4 g/dL (ref 30.0–36.0)
MCV: 94.3 fL (ref 78.0–100.0)
PLATELETS: 338 10*3/uL (ref 150–400)
RBC: 5.05 MIL/uL (ref 4.22–5.81)
RDW: 15.1 % (ref 11.5–15.5)
WBC: 18.8 10*3/uL — AB (ref 4.0–10.5)

## 2016-05-26 LAB — TROPONIN I
TROPONIN I: 0.07 ng/mL — AB (ref <0.03)
Troponin I: >65 ng/mL (ref <0.03)

## 2016-05-26 LAB — MRSA PCR SCREENING: MRSA by PCR: NEGATIVE

## 2016-05-26 LAB — APTT: APTT: 32 s (ref 24–36)

## 2016-05-26 LAB — POCT I-STAT TROPONIN I: TROPONIN I, POC: 0.08 ng/mL (ref 0.00–0.08)

## 2016-05-26 SURGERY — LEFT HEART CATH AND CORONARY ANGIOGRAPHY
Anesthesia: LOCAL

## 2016-05-26 MED ORDER — ALPRAZOLAM 0.25 MG PO TABS: 0.2500 mg | ORAL_TABLET | Freq: Two times a day (BID) | ORAL | Status: DC | PRN

## 2016-05-26 MED ORDER — ASPIRIN 81 MG PO CHEW: 81.0000 mg | CHEWABLE_TABLET | Freq: Every day | ORAL | Status: DC

## 2016-05-26 MED ORDER — TICAGRELOR 90 MG PO TABS
ORAL_TABLET | ORAL | Status: AC
  Filled 2016-05-26: qty 1

## 2016-05-26 MED ORDER — ATORVASTATIN CALCIUM 80 MG PO TABS: 80.0000 mg | ORAL_TABLET | Freq: Every day | ORAL | Status: DC

## 2016-05-26 MED ORDER — MIDAZOLAM HCL 2 MG/2ML IJ SOLN
INTRAMUSCULAR | Status: DC | PRN
  Administered 2016-05-26: 2 mg via INTRAVENOUS
  Administered 2016-05-26: 1 mg via INTRAVENOUS

## 2016-05-26 MED ORDER — CARVEDILOL 3.125 MG PO TABS
3.1250 mg | ORAL_TABLET | Freq: Two times a day (BID) | ORAL | Status: DC
  Administered 2016-05-26 – 2016-05-27 (×5): 3.125 mg via ORAL
  Filled 2016-05-26 (×5): qty 1

## 2016-05-26 MED ORDER — CARVEDILOL 3.125 MG PO TABS
3.1250 mg | ORAL_TABLET | Freq: Two times a day (BID) | ORAL | Status: DC
  Filled 2016-05-26: qty 1

## 2016-05-26 MED ORDER — HEPARIN SODIUM (PORCINE) 5000 UNIT/ML IJ SOLN: 4000.0000 [IU] | INTRAMUSCULAR | Status: DC

## 2016-05-26 MED ORDER — NITROGLYCERIN IN D5W 200-5 MCG/ML-% IV SOLN
INTRAVENOUS | Status: AC
  Filled 2016-05-26: qty 250

## 2016-05-26 MED ORDER — VERAPAMIL HCL 2.5 MG/ML IV SOLN
INTRAVENOUS | Status: DC | PRN
  Administered 2016-05-26: 10 mL via INTRA_ARTERIAL

## 2016-05-26 MED ORDER — NITROGLYCERIN IN D5W 200-5 MCG/ML-% IV SOLN: 0.0000 ug/min | Freq: Once | INTRAVENOUS | Status: DC

## 2016-05-26 MED ORDER — ALPRAZOLAM 0.5 MG PO TABS
2.0000 mg | ORAL_TABLET | Freq: Four times a day (QID) | ORAL | Status: DC | PRN
  Administered 2016-05-26 – 2016-05-27 (×6): 2 mg via ORAL
  Filled 2016-05-26 (×6): qty 4

## 2016-05-26 MED ORDER — ONDANSETRON HCL 4 MG/2ML IJ SOLN: 4.0000 mg | Freq: Four times a day (QID) | INTRAMUSCULAR | Status: DC | PRN

## 2016-05-26 MED ORDER — VERAPAMIL HCL 2.5 MG/ML IV SOLN
INTRAVENOUS | Status: AC
  Filled 2016-05-26: qty 2

## 2016-05-26 MED ORDER — HEPARIN (PORCINE) IN NACL 2-0.9 UNIT/ML-% IJ SOLN
INTRAMUSCULAR | Status: AC
  Filled 2016-05-26: qty 1000

## 2016-05-26 MED ORDER — SODIUM CHLORIDE 0.9 % IV SOLN: 10.0000 mL/h | INTRAVENOUS | Status: DC

## 2016-05-26 MED ORDER — IOPAMIDOL (ISOVUE-370) INJECTION 76%
INTRAVENOUS | Status: DC | PRN
  Administered 2016-05-26: 135 mL via INTRA_ARTERIAL

## 2016-05-26 MED ORDER — SODIUM CHLORIDE 0.9 % IV SOLN
INTRAVENOUS | Status: DC | PRN
  Administered 2016-05-26 (×2): 1.75 mg/kg/h via INTRAVENOUS

## 2016-05-26 MED ORDER — BIVALIRUDIN BOLUS VIA INFUSION - CUPID
INTRAVENOUS | Status: DC | PRN
  Administered 2016-05-26: 80.25 mg via INTRAVENOUS

## 2016-05-26 MED ORDER — FENTANYL CITRATE (PF) 100 MCG/2ML IJ SOLN
INTRAMUSCULAR | Status: DC | PRN
  Administered 2016-05-26: 50 ug via INTRAVENOUS
  Administered 2016-05-26: 25 ug via INTRAVENOUS

## 2016-05-26 MED ORDER — LIDOCAINE HCL (PF) 1 % IJ SOLN
INTRAMUSCULAR | Status: AC
  Filled 2016-05-26: qty 30

## 2016-05-26 MED ORDER — HYDROCODONE-ACETAMINOPHEN 10-325 MG PO TABS
1.0000 | ORAL_TABLET | ORAL | Status: DC | PRN
  Administered 2016-05-26 – 2016-05-27 (×4): 1 via ORAL
  Filled 2016-05-26 (×4): qty 1

## 2016-05-26 MED ORDER — PERFLUTREN LIPID MICROSPHERE
1.0000 mL | INTRAVENOUS | Status: AC | PRN
  Administered 2016-05-26: 2 mL via INTRAVENOUS
  Filled 2016-05-26: qty 10

## 2016-05-26 MED ORDER — NITROGLYCERIN 1 MG/10 ML FOR IR/CATH LAB
INTRA_ARTERIAL | Status: DC | PRN
  Administered 2016-05-26: 200 ug via INTRACORONARY

## 2016-05-26 MED ORDER — TICAGRELOR 90 MG PO TABS
ORAL_TABLET | ORAL | Status: DC | PRN
  Administered 2016-05-26: 180 mg

## 2016-05-26 MED ORDER — SODIUM CHLORIDE 0.9 % IV SOLN
INTRAVENOUS | Status: DC | PRN
  Administered 2016-05-26: 100 mL/h via INTRAVENOUS

## 2016-05-26 MED ORDER — MIDAZOLAM HCL 2 MG/2ML IJ SOLN
INTRAMUSCULAR | Status: AC
  Filled 2016-05-26: qty 2

## 2016-05-26 MED ORDER — PERFLUTREN LIPID MICROSPHERE
INTRAVENOUS | Status: AC
Start: 2016-05-26 — End: 2016-05-26
  Administered 2016-05-26: 2 mL
  Filled 2016-05-26: qty 10

## 2016-05-26 MED ORDER — HEPARIN SODIUM (PORCINE) 5000 UNIT/ML IJ SOLN
5000.0000 [IU] | Freq: Three times a day (TID) | INTRAMUSCULAR | Status: DC
  Administered 2016-05-26 – 2016-05-28 (×7): 5000 [IU] via SUBCUTANEOUS
  Filled 2016-05-26 (×7): qty 1

## 2016-05-26 MED ORDER — CITALOPRAM HYDROBROMIDE 20 MG PO TABS
40.0000 mg | ORAL_TABLET | Freq: Every day | ORAL | Status: DC
  Administered 2016-05-26 – 2016-05-28 (×3): 40 mg via ORAL
  Filled 2016-05-26 (×3): qty 2

## 2016-05-26 MED ORDER — NICOTINE 14 MG/24HR TD PT24
14.0000 mg | MEDICATED_PATCH | Freq: Every day | TRANSDERMAL | Status: DC
  Administered 2016-05-26 – 2016-05-28 (×3): 14 mg via TRANSDERMAL
  Filled 2016-05-26 (×3): qty 1

## 2016-05-26 MED ORDER — FENTANYL CITRATE (PF) 100 MCG/2ML IJ SOLN
INTRAMUSCULAR | Status: AC
  Filled 2016-05-26: qty 2

## 2016-05-26 MED ORDER — ZOLPIDEM TARTRATE 5 MG PO TABS: 5.0000 mg | ORAL_TABLET | Freq: Every evening | ORAL | Status: DC | PRN

## 2016-05-26 MED ORDER — ACETAMINOPHEN 325 MG PO TABS: 650.0000 mg | ORAL_TABLET | ORAL | Status: DC | PRN

## 2016-05-26 MED ORDER — IOPAMIDOL (ISOVUE-370) INJECTION 76%
INTRAVENOUS | Status: AC
  Filled 2016-05-26: qty 125

## 2016-05-26 MED ORDER — LIDOCAINE HCL (PF) 1 % IJ SOLN
INTRAMUSCULAR | Status: DC | PRN
  Administered 2016-05-26: 2 mL via INTRADERMAL

## 2016-05-26 MED ORDER — NITROGLYCERIN 0.4 MG SL SUBL
0.4000 mg | SUBLINGUAL_TABLET | SUBLINGUAL | Status: DC | PRN
Start: 2016-05-26 — End: 2016-05-28
  Administered 2016-05-26 (×2): 0.4 mg via SUBLINGUAL
  Filled 2016-05-26 (×2): qty 1

## 2016-05-26 MED ORDER — BIVALIRUDIN 250 MG IV SOLR
INTRAVENOUS | Status: AC
  Filled 2016-05-26: qty 250

## 2016-05-26 MED ORDER — LISINOPRIL 2.5 MG PO TABS
2.5000 mg | ORAL_TABLET | Freq: Every day | ORAL | Status: DC
  Administered 2016-05-26 – 2016-05-27 (×2): 2.5 mg via ORAL
  Filled 2016-05-26 (×3): qty 1

## 2016-05-26 MED ORDER — SODIUM CHLORIDE 0.9 % IV SOLN: 250.0000 mL | INTRAVENOUS | Status: DC | PRN

## 2016-05-26 MED ORDER — ATORVASTATIN CALCIUM 80 MG PO TABS
80.0000 mg | ORAL_TABLET | Freq: Every day | ORAL | Status: DC
  Administered 2016-05-26 – 2016-05-27 (×2): 80 mg via ORAL
  Filled 2016-05-26 (×2): qty 1

## 2016-05-26 MED ORDER — AMITRIPTYLINE HCL 25 MG PO TABS
50.0000 mg | ORAL_TABLET | Freq: Every day | ORAL | Status: DC
  Administered 2016-05-26 – 2016-05-27 (×2): 50 mg via ORAL
  Filled 2016-05-26: qty 2
  Filled 2016-05-26 (×2): qty 1

## 2016-05-26 MED ORDER — SODIUM CHLORIDE 0.9% FLUSH
3.0000 mL | Freq: Two times a day (BID) | INTRAVENOUS | Status: DC
  Administered 2016-05-26 – 2016-05-27 (×2): 3 mL via INTRAVENOUS

## 2016-05-26 MED ORDER — TICAGRELOR 90 MG PO TABS
90.0000 mg | ORAL_TABLET | Freq: Two times a day (BID) | ORAL | Status: DC
  Administered 2016-05-26 – 2016-05-27 (×3): 90 mg via ORAL
  Filled 2016-05-26 (×3): qty 1

## 2016-05-26 MED ORDER — SODIUM CHLORIDE 0.9% FLUSH: 3.0000 mL | INTRAVENOUS | Status: DC | PRN

## 2016-05-26 MED ORDER — NITROGLYCERIN 1 MG/10 ML FOR IR/CATH LAB
INTRA_ARTERIAL | Status: AC
  Filled 2016-05-26: qty 10

## 2016-05-26 MED ORDER — HEPARIN SODIUM (PORCINE) 5000 UNIT/ML IJ SOLN
INTRAMUSCULAR | Status: AC
  Filled 2016-05-26: qty 1

## 2016-05-26 MED ORDER — MORPHINE SULFATE (PF) 2 MG/ML IV SOLN
2.0000 mg | INTRAVENOUS | Status: DC | PRN
  Administered 2016-05-26 (×8): 2 mg via INTRAVENOUS
  Filled 2016-05-26 (×8): qty 1

## 2016-05-26 MED ORDER — ASPIRIN 81 MG PO CHEW
81.0000 mg | CHEWABLE_TABLET | Freq: Every day | ORAL | Status: DC
  Administered 2016-05-26 – 2016-05-28 (×3): 81 mg via ORAL
  Filled 2016-05-26 (×3): qty 1

## 2016-05-26 MED ORDER — SODIUM CHLORIDE 0.9 % IV SOLN
INTRAVENOUS | Status: DC
  Administered 2016-05-26: 150 mL/h via INTRAVENOUS
  Administered 2016-05-26: 1000 mL via INTRAVENOUS
  Administered 2016-05-26 (×2): via INTRAVENOUS

## 2016-05-26 MED ORDER — ONDANSETRON HCL 4 MG/2ML IJ SOLN
4.0000 mg | Freq: Four times a day (QID) | INTRAMUSCULAR | Status: DC | PRN
  Administered 2016-05-26: 4 mg via INTRAVENOUS
  Filled 2016-05-26: qty 2

## 2016-05-26 MED ORDER — DIAZEPAM 5 MG PO TABS: 5.0000 mg | ORAL_TABLET | Freq: Four times a day (QID) | ORAL | Status: DC | PRN

## 2016-05-26 SURGICAL SUPPLY — 22 items
BALLN EMERGE MR 2.0X12 (BALLOONS) ×2
BALLN EMERGE MR 3.0X15 (BALLOONS) ×2
BALLN ~~LOC~~ EMERGE MR 3.75X12 (BALLOONS) ×2
BALLOON EMERGE MR 2.0X12 (BALLOONS) IMPLANT
BALLOON EMERGE MR 3.0X15 (BALLOONS) IMPLANT
BALLOON ~~LOC~~ EMERGE MR 3.75X12 (BALLOONS) IMPLANT
CATH IMPULSE 5F ANG/FL3.5 (CATHETERS) ×1 IMPLANT
CATH VISTA GUIDE 6FR XBLAD3.5 (CATHETERS) ×1 IMPLANT
DEVICE RAD COMP TR BAND LRG (VASCULAR PRODUCTS) ×1 IMPLANT
GLIDESHEATH SLEND SS 6F .021 (SHEATH) ×1 IMPLANT
KIT ENCORE 26 ADVANTAGE (KITS) ×1 IMPLANT
KIT HEART LEFT (KITS) ×2 IMPLANT
PACK CARDIAC CATHETERIZATION (CUSTOM PROCEDURE TRAY) ×2 IMPLANT
SHEATH PINNACLE 6F 10CM (SHEATH) IMPLANT
STENT XIENCE ALPINE RX 3.5X18 (Permanent Stent) ×1 IMPLANT
SYR MEDRAD MARK V 150ML (SYRINGE) ×2 IMPLANT
TRANSDUCER W/STOPCOCK (MISCELLANEOUS) ×2 IMPLANT
TUBING CIL FLEX 10 FLL-RA (TUBING) ×2 IMPLANT
WIRE ASAHI MEDIUM 180CM (WIRE) ×1 IMPLANT
WIRE EMERALD 3MM-J .035X150CM (WIRE) IMPLANT
WIRE PT2 MS 185 (WIRE) ×1 IMPLANT
WIRE SAFE-T 1.5MM-J .035X260CM (WIRE) ×1 IMPLANT

## 2016-05-26 NOTE — Progress Notes (Signed)
CRITICAL VALUE ALERT  Critical value received:  Troponin >65  Date of notification:  05/26/2016  Time of notification:  0600  Critical value read back:Yes.    Nurse who received alert:  Gladys Damme, RN  MD notified (1st page):  Jettie Booze, PA  Time of first page:  [phone]  MD notified (2nd page):  Time of second page:  Responding MD:  Jettie Booze, PA  Time MD responded:  [phone]

## 2016-05-26 NOTE — Progress Notes (Signed)
    Subjective:  Mild chest discomfort. No shortness of breath. Some sweats noted.   Objective:  Vital Signs in the last 24 hours: Temp:  [97.9 F (36.6 C)-98.3 F (36.8 C)] 97.9 F (36.6 C) (08/30 0800) Pulse Rate:  [0-295] 94 (08/30 0800) Resp:  [0-149] 15 (08/30 0800) BP: (97-139)/(66-95) 135/92 (08/30 0800) SpO2:  [0 %-100 %] 93 % (08/30 0800) Weight:  [106.1 kg (233 lb 14.5 oz)] 106.1 kg (233 lb 14.5 oz) (08/30 0305)  Intake/Output from previous day: 08/29 0701 - 08/30 0700 In: 551.7 [I.V.:551.7] Out: 650 [Urine:650]  Physical Exam: Pt is alert and oriented, NAD HEENT: normal Neck: JVP - normal Lungs: coarse BS bilaterally CV: RRR without murmur or gallop Abd: soft, NT, Positive BS, no hepatomegaly Ext: right BKA, no edema on left Skin: warm/dry no rash   Lab Results:  Recent Labs  05/26/16 0112  WBC 18.8*  HGB 15.9  PLT 338    Recent Labs  05/26/16 0112 05/26/16 0459  NA 134* 135  K 3.4* 3.8  CL 97* 98*  CO2 24 29  GLUCOSE 162* 133*  BUN 11 11  CREATININE 1.14 1.15    Recent Labs  05/26/16 0112 05/26/16 0459  TROPONINI 0.07* >65.00*    Cardiac Studies: Cath reviewed  Tele: Sinus rhythm  Assessment/Plan:  1. Anterior STEMI s/p Primary PCI with drug-eluting stent 2. Tobacco abuse 3. PAD s/p amputation 4. Hyperlipidemia  Pt stable after STEMI early this am. Cath study reviewed. Add low-dose coreg and lisinopril this am. Continue high-intensity statin, ASA, and brilinta. Add nicotine patch - tobacco cessation counseling done. Check 2D echo.  Dispo: keep in CCU today, anticipate tx tele tomorrow, home Friday if stable.  Sherren Mocha, M.D. 05/26/2016, 9:04 AM

## 2016-05-26 NOTE — Progress Notes (Signed)
CARDIAC REHAB PHASE I   Pt c/o significant chest pain with deep breaths. Began ed with pt and wife including MI, stent, antiplatelet and smoking cessation. Pt urgently wanted to sit up due to breathing and coughing, c/o pain. Pt and wife easily distracted, will need more education. Wife to bring prosthesis tonight. [place], ACSM 05/26/2016 2:13 PM

## 2016-05-26 NOTE — ED Triage Notes (Signed)
Pt. arrived with EMS from home woke up this evening with left chest pain radiating to left arm with SOB and wheezing , received 324 mg ASA and 2 NTG sl prior to arrival with no relief.

## 2016-05-26 NOTE — Progress Notes (Signed)
  Echocardiogram 2D Echocardiogram with Definity has been performed.  Tresa Res 05/26/2016, 1:56 PM

## 2016-05-26 NOTE — ED Provider Notes (Signed)
[facility] DEPT Provider Note   CSN: [id number] Arrival date & time: 05/26/16  0111  By signing my name below, I, Emmanuella Mensah, attest that this documentation has been prepared under the direction and in the presence of Orpah Greek, MD. Electronically Signed: Judithann Sauger, ED Scribe. 05/26/16. 1:30 AM.    History   Chief Complaint Chief Complaint  Patient presents with  . Chest Pain    HPI Comments: [name] is a 55 y.o. male with a hx of DVT and Hep C who presents to the Emergency Department complaining of sudden onset of ongoing moderate left-sided chest pain that radiates to his left arm onset 10:30 pm yesterday. He reports associated nausea and diaphoresis. He adds that he had SOB initially. No alleviating factors noted. Pt did not try any medications prior to EMS arrival. Per EMS, pt was wheezing so he received a duo neb in addition to the 2 sub-lingual NTG and 3224 mg ASA. He denies a hx of MI, heart issues, or high cholesterol. He reports an extended family hx of MI. No fever, chills, vomiting, diarrhea, or leg swelling.    The history is provided by the patient. No language interpreter was used.  Chest Pain   This is a new problem. The current episode started yesterday. The pain is present in the lateral region. The pain is moderate. The pain radiates to the right neck and left arm. Associated symptoms include diaphoresis, nausea and shortness of breath. Pertinent negatives include no fever and no vomiting. Risk factors include male gender.  His past medical history is significant for DVT.    Past Medical History:  Diagnosis Date  . Anxiety   . Asthma   . Depression   . DVT (deep venous thrombosis) ([place]) 2009  . Hepatitis C    no prior treatment  -   did treatment 2015  . Tobacco abuse 01/28/2016    Patient Active Problem List   Diagnosis Date Noted  . Tobacco abuse 01/28/2016  . Tubular adenoma of colon 07/11/2014  . Chronic hepatitis C  without hepatic coma ([place]) 05/03/2014  . Encounter for screening colonoscopy 05/03/2014  . PAD (peripheral artery disease) ([place]) 01/25/2013  . Peripheral vascular disease, unspecified 07/20/2012  . Atherosclerosis of native arteries of the extremities with intermittent claudication 12/16/2011  . Acute hepatitis C virus infection 06/19/2007  . SEBORRHEIC KERATOSIS 06/13/2007  . BLOOD CHEMISTRY, ABNORMAL 06/13/2007  . 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 ANXIETY DISORDER, GENERALIZED 05/30/2007  . LEG PAIN, RIGHT 11/26/2003    Past Surgical History:  Procedure Laterality Date  . AORTOGRAM  08/08/2009   for LLE claudication     By Dr. Oneida Alar  . 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 BIOPSY N/A 05/30/2014   Procedure: BIOPSY;  Surgeon: [name], MD;  Location: [facility];  Service: Endoscopy;  Laterality: N/A;  . BLADDER TUMOR EXCISION  8/812  . CHOLECYSTECTOMY    . COLONOSCOPY WITH PROPOFOL N/A 05/30/2014   JJO:ACZYSA colonic polyp-likely source of hematochezia-removed as described above  . ESOPHAGOGASTRODUODENOSCOPY (EGD) WITH PROPOFOL N/A 05/30/2014   YTK:[id number] and bulbar erosions s/p gastric biopsy. No evidence of portal gastropathy on today's examination.  . FEMORAL-TIBIAL BYPASS GRAFT  2009   Right side using non-reversed GSV   By Dr. Oneida Alar  . FEMORAL-TIBIAL BYPASS GRAFT  11/24/2007   Right femoral to anterior tibial BPG   by Dr. Oneida Alar  . HAND TENDON SURGERY Left 2013  Left 5th finger  . HERNIA REPAIR    . INCISIONAL HERNIA REPAIR N/A 12/25/2014   Procedure: HERNIA REPAIR INCISIONAL WITH MESH;  Surgeon: [name], MD;  Location: [facility];  Service: General;  Laterality: N/A;  . INSERTION OF MESH N/A 12/25/2014   Procedure: INSERTION OF MESH;  Surgeon: [name], MD;  Location: [facility];  Service: General;  Laterality: N/A;  . LOWER EXTREMITY ANGIOGRAM Left 12/30/2015   Procedure: Lower Extremity Angiogram;  Surgeon: [name], MD;  Location: [facility];  Service: Cardiovascular;  Laterality: Left;  . PERIPHERAL VASCULAR CATHETERIZATION N/A 12/30/2015   Procedure: Abdominal Aortogram;  Surgeon: [name], MD;  Location: [facility];  Service: Cardiovascular;  Laterality: N/A;  . POLYPECTOMY  05/30/2014   Procedure: POLYPECTOMY;  Surgeon: [name], MD;  Location: [facility];  Service: Endoscopy;;  . TONSILLECTOMY         Home Medications    Prior to Admission medications   Medication Sig Start Date End Date Taking? Authorizing Provider  alprazolam [name]) 2 MG tablet Take 2 mg by mouth 4 (four) times daily as needed for anxiety.     Historical Provider, MD  amitriptyline (ELAVIL) 50 MG tablet Take 50 mg by mouth at bedtime.    Historical Provider, MD  aspirin 81 MG EC tablet Take 81 mg by mouth daily.      Historical Provider, MD  citalopram (CELEXA) 40 MG tablet Take 40 mg by mouth daily. 12/02/14   Historical Provider, MD  HYDROcodone-acetaminophen (NORCO) 10-325 MG per tablet Take 1 tablet by mouth every 4 (four) hours as needed for moderate pain. pain 12/25/14   Aviva Signs, MD    Family History Family History  Problem Relation Age of Onset  . Vision loss Mother   . Ulcers Father   . Colon cancer Neg Hx     Social History Social History  Substance Use Topics  . Smoking status: Current Every Day Smoker    Packs/day: 0.00    Types: Cigarettes  . Smokeless tobacco: Never Used     Comment: cigarettes  . Alcohol use No     Comment: Patient quit drinking about a year ago; history of heavy ETOH use...has a beer now and then     Allergies   Lyrica [pregabalin] and Neurontin [gabapentin]   Review of Systems Review of Systems  Constitutional: Positive for diaphoresis. Negative for chills and fever.  Respiratory: Positive for shortness of breath.   Cardiovascular: Positive for chest pain. Negative for leg swelling.  Gastrointestinal: Positive for nausea. Negative for diarrhea and vomiting.  All other  systems reviewed and are negative.    Physical Exam Updated Vital Signs There were no vitals taken for this visit.  Physical Exam  Constitutional: He is oriented to person, place, and time. He appears well-developed and well-nourished. No distress.  HENT:  Head: Normocephalic and atraumatic.  Right Ear: Hearing normal.  Left Ear: Hearing normal.  Nose: Nose normal.  Mouth/Throat: Oropharynx is clear and moist and mucous membranes are normal.  Eyes: Conjunctivae and EOM are normal. Pupils are equal, round, and reactive to light.  Neck: Normal range of motion. Neck supple.  Cardiovascular: Regular rhythm, S1 normal and S2 normal.  Exam reveals no gallop and no friction rub.   No murmur heard. Pulmonary/Chest: Effort normal and breath sounds normal. No respiratory distress. He exhibits no tenderness.  Abdominal: Soft. Normal appearance and bowel sounds are normal.  There is no hepatosplenomegaly. There is no tenderness. There is no rebound, no guarding, no tenderness at McBurney's point and negative Murphy's sign. No hernia.  Musculoskeletal: Normal range of motion.  Neurological: He is alert and oriented to person, place, and time. He has normal strength. No cranial nerve deficit or sensory deficit. Coordination normal. GCS eye subscore is 4. GCS verbal subscore is 5. GCS motor subscore is 6.  Skin: Skin is warm, dry and intact. No rash noted. No cyanosis.  Psychiatric: He has a normal mood and affect. His speech is normal and behavior is normal. Thought content normal.  Nursing note and vitals reviewed.    ED Treatments / Results  DIAGNOSTIC STUDIES:    COORDINATION OF CARE: 1:26 AM- Pt advised of plan for treatment and pt agrees.     Labs (all labs ordered are listed, but only abnormal results are displayed) Labs Reviewed  CBC  DIFFERENTIAL  PROTIME-INR  APTT  COMPREHENSIVE METABOLIC PANEL  TROPONIN I  LIPID PANEL    EKG  EKG Interpretation None        Radiology No results found.  Procedures Procedures (including critical care time)  Medications Ordered in ED Medications  0.9 %  sodium chloride infusion (not administered)  heparin injection 4,000 Units (not administered)  nitroGLYCERIN 50 mg in dextrose 5 % 250 mL (0.2 mg/mL) infusion (not administered)  heparin 5000 UNIT/ML injection (not administered)  nitroGLYCERIN 0.2 mg/mL in dextrose 5 % infusion (not administered)     Initial Impression / Assessment and Plan / ED Course  Orpah Greek, MD has reviewed the triage vital signs and the nursing notes.  Pertinent labs & imaging results that were available during my care of the patient were reviewed by me and considered in my medical decision making (see chart for details).  Clinical Course   Patient presents to the emergency part for evaluation of chest pain. Patient had sudden onset of chest pain, shortness of breath, nausea, diaphoresis while lying in bed tonight. He does not have any known coronary artery disease but does have peripheral arterial disease. EKG performed by EMS was consistent with ST elevation MI.  Patient had been administered aspirin and nitroglycerin prior to arrival. Elevated blood pressure has improved but his pain is not improved. Patient seen by myself and cardiology at arrival. Patient administered heparin bolus and initiated on IV nitroglycerin. He will be taken to the Cath Lab for treatment of ST elevation MI.  CRITICAL CARE Performed by: Orpah Greek.   Total critical care time: 30 minutes  Critical care time was exclusive of separately billable procedures and treating other patients.  Critical care was necessary to treat or prevent imminent or life-threatening deterioration.  Critical care was time spent personally by me on the following activities: development of treatment plan with patient and/or surrogate as well as nursing, discussions with consultants, evaluation of  patient's response to treatment, examination of patient, obtaining history from patient or surrogate, ordering and performing treatments and interventions, ordering and review of laboratory studies, ordering and review of radiographic studies, pulse oximetry and re-evaluation of patient's condition.   Final Clinical Impressions(s) / ED Diagnoses   Final diagnoses:  Acute ST elevation myocardial infarction (STEMI) of lateral wall (HCC)    New Prescriptions New Prescriptions   No medications on file   I personally performed the services described in this documentation, which was scribed in my presence. The recorded information has been reviewed and is accurate.  Orpah Greek, MD 05/26/16 [phone]

## 2016-05-26 NOTE — Progress Notes (Signed)
Melton Krebs, MD notified of post cath EKG when pt arrived to unit. No new orders given. Will continue to monitor.

## 2016-05-26 NOTE — H&P (Signed)
Patient ID: [name] MRN: [id number], DOB/AGE: 06/23/1961   Admit date: 05/26/2016  Requesting Physician: Primary Physician: [name]., Costa Primary Cardiologist: [name] Reason for admission: STEMI  Pt. Profile:  The pt is a 54yo male with PMH significant for HCV, reactive airway disease, PAD s/p right femoral-tibial bypass followed by BKA, and persistent cigarette smoking who developed acute onset SSCP at approximately 10:30 this evening.   The CP progressed in severity over the next hour, described as a "deep pressure" over the center of his chest, and was associated with diaphoresis and nausea.  [name] has baseline SOB at wheezing, but these symptoms became progressively worse during the episode as well.  When EMS arrived he was given 2 SLNG which reduced the severity of his pain, as well as full dose aspirin.  On arrival to the ED he was hemodynamically stable although in mild distress 2/2 his chest pain.  ECG revealed anterolateral ST elevations and was given a 5000U heparin bolus and started on a nitro gtt.  He was subsequently taken emergently to the cath lab.   In regards to his PAD, an abdominal aortogram  was performed on 12/30/15 and demonstrated a patent aortoiliac system on the left with a patent left common iliac stent, but occlusion of the right internal iliac artery, the left superficial femoral and popliteal arteries.  He had 2 vessel runoff of the L foot posterior tibial and anterior tibial with diminutive peroneal.    Problem List  Past Medical History:  Diagnosis Date  . Anxiety   . Asthma   . Depression   . DVT (deep venous thrombosis) ([place]) 2009  . Hepatitis C    no prior treatment  -   did treatment 2015  . Tobacco abuse 01/28/2016    Past Surgical History:  Procedure Laterality Date  . AORTOGRAM  08/08/2009   for LLE claudication     By Dr. Oneida Costa  . 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 BIOPSY N/A 05/30/2014   Procedure: BIOPSY;   Surgeon: Mark Dolin, Costa;  Location: [facility];  Service: Endoscopy;  Laterality: N/A;  . BLADDER TUMOR EXCISION  8/812  . CHOLECYSTECTOMY    . COLONOSCOPY WITH PROPOFOL N/A 05/30/2014   [id number] colonic polyp-likely source of hematochezia-removed as described above  . ESOPHAGOGASTRODUODENOSCOPY (EGD) WITH PROPOFOL N/A 05/30/2014   ZES:PQZRAQ and bulbar erosions s/p gastric biopsy. No evidence of portal gastropathy on today's examination.  . FEMORAL-TIBIAL BYPASS GRAFT  2009   Right side using non-reversed GSV   By Dr. Oneida Costa  . FEMORAL-TIBIAL BYPASS GRAFT  11/24/2007   Right femoral to anterior tibial BPG   by Dr. Oneida Costa  . HAND TENDON SURGERY Left 2013   Left 5th finger  . HERNIA REPAIR    . INCISIONAL HERNIA REPAIR N/A 12/25/2014   Procedure: HERNIA REPAIR INCISIONAL WITH MESH;  Surgeon: Mark Signs Md, Costa;  Location: [facility];  Service: General;  Laterality: N/A;  . INSERTION OF MESH N/A 12/25/2014   Procedure: INSERTION OF MESH;  Surgeon: Mark Signs Md, Costa;  Location: [facility];  Service: General;  Laterality: N/A;  . LOWER EXTREMITY ANGIOGRAM Left 12/30/2015   Procedure: Lower Extremity Angiogram;  Surgeon: Mark Dutch, Costa;  Location: [facility];  Service: Cardiovascular;  Laterality: Left;  . PERIPHERAL VASCULAR CATHETERIZATION N/A 12/30/2015   Procedure: Abdominal Aortogram;  Surgeon: Mark Dutch, Costa;  Location: [facility];  Service: Cardiovascular;  Laterality: N/A;  . POLYPECTOMY  05/30/2014   Procedure: POLYPECTOMY;  Surgeon: Mark Dolin, Costa;  Location: [facility];  Service: Endoscopy;;  . TONSILLECTOMY       Allergies  Allergies  Allergen Reactions  . Lyrica [Pregabalin] Palpitations    Chest pain and heart palps.  . Neurontin [Gabapentin] Palpitations    Chest pain and heart palp     Home Medications  Prior to Admission medications   Medication Sig Start Date End Date Taking? Authorizing Provider  alprazolam [name]) 2 MG tablet Take 2 mg by mouth 4  (four) times daily as needed for anxiety.     Historical Provider, Costa  amitriptyline (ELAVIL) 50 MG tablet Take 50 mg by mouth at bedtime.    Historical Provider, Costa  aspirin 81 MG EC tablet Take 81 mg by mouth daily.      Historical Provider, Costa  citalopram (CELEXA) 40 MG tablet Take 40 mg by mouth daily. 12/02/14   Historical Provider, Costa  HYDROcodone-acetaminophen (NORCO) 10-325 MG per tablet Take 1 tablet by mouth every 4 (four) hours as needed for moderate pain. pain 12/25/14   Mark Signs, Costa    Family History  Family History  Problem Relation Age of Onset  . Vision loss Mother   . Ulcers Father   . Colon cancer Neg Hx    Family Status  Relation Status  . Mother Alive  . Father Alive  . Neg Hx      Social History  Social History   Social History  . Marital status: Married    Spouse name: N/A  . Number of children: N/A  . Years of education: N/A   Occupational History  . Not on file.   Social History Main Topics  . Smoking status: Current Every Day Smoker    Packs/day: 0.00    Types: Cigarettes  . Smokeless tobacco: Never Used     Comment: cigarettes  . Alcohol use No     Comment: Patient quit drinking about a year ago; history of heavy ETOH use...has a beer now and then  . Drug use:     Types: Marijuana     Comment: marijuana-last used 12/2015  . Sexual activity: Not on file   Other Topics Concern  . Not on file   Social History Narrative  . No narrative on file     Review of Systems General:  No chills, fever, night sweats or weight changes.  Cardiovascular:  As per above Dermatological: No rash, lesions/masses Respiratory: as per above Urologic: No hematuria, dysuria Abdominal:   No nausea, vomiting, diarrhea, bright red blood per rectum, melena, or hematemesis Neurologic:  No visual changes, wkns, changes in mental status. All other systems reviewed and are otherwise negative except as noted above.  Physical Exam  There were no vitals taken  for this visit.  General: moderate distress, AAOx3, appears older than stated age Psych: Normal affect. Neuro: Alert and oriented X 3. Moves all extremities spontaneously. HEENT: Normal  Neck: Supple without bruits or JVD. Lungs: mild diffuse wheezing throughout bilateral lung fields Heart: RRR, +S1 +S2, no m/r/g, no JVD Abdomen: Soft, non-tender, non-distended, BS + x 4.  Extremities: right BKA  Labs  No results for input(s): CKTOTAL, CKMB, TROPONINI in the last 72 hours. Lab Results  Component Value Date   WBC 8.5 01/26/2016   HGB 15.8 01/26/2016   HCT 48.4 01/26/2016   MCV 92.7 01/26/2016   PLT 301 01/26/2016  No results for input(s): NA, K, CL, CO2, BUN, CREATININE, CALCIUM, PROT, BILITOT, ALKPHOS, ALT, AST, GLUCOSE in the last 168 hours.  Invalid input(s): LABALBU Lab Results  Component Value Date   CHOL 146 05/30/2007   HDL 58 05/30/2007   LDLCALC 79 05/30/2007   TRIG 47 05/30/2007   Lab Results  Component Value Date   DDIMER (H) 04/05/2008    1.51        AT THE INHOUSE ESTABLISHED CUTOFF VALUE OF 0.48 ug/mL FEU, THIS ASSAY HAS BEEN DOCUMENTED IN THE LITERATURE TO HAVE     Radiology/Studies  No results found.  ECG  Per my review, sinus rhythm with normal axis and normal intervals, anterolateral ST elevations  ASSESSMENT AND PLAN  The pt is a 55yo male with PMH significant for HCV, reactive airway disease, PAD, and persistent cigarette smoking who developed acute onset SSCP with ECG demonstrating anterolateral ST elevations consistent with STEMI.  # STEMI; persistent CP in ED, now in cath lab for emergent coronary angiography and PCI - s/p ASA '324mg'$  PO x 1, continue '81mg'$  PO QDAY - s/p 5000U heparin bolus, d/c systemic anticoagulation after PCI - P2Y12 inhibitor as per interventionalist - start atorvastatin '80mg'$  PO QDAY - start low dose metoprolol is tolerated by BP post-procedure - TTE in am - continuous telemetry  # PAD; stable symptoms over recent  weeks - continue home pain regimen with hydrocodone-acetaminophen  # Insomnia; chronic problem - continue home amitriptyline  # Anxiety - continue home alprazolam and citalopram   Signed, Clayborne Dana, Costa 05/26/2016, 1:30 AM

## 2016-05-26 NOTE — ED Notes (Signed)
To Cath lab with RN and EMT

## 2016-05-27 LAB — POCT ACTIVATED CLOTTING TIME: ACTIVATED CLOTTING TIME: 791 s

## 2016-05-27 MED ORDER — FUROSEMIDE 10 MG/ML IJ SOLN
40.0000 mg | Freq: Once | INTRAMUSCULAR | Status: AC
  Administered 2016-05-27: 40 mg via INTRAVENOUS
  Filled 2016-05-27: qty 4

## 2016-05-27 MED ORDER — PRASUGREL HCL 10 MG PO TABS: 10.0000 mg | ORAL_TABLET | Freq: Every day | ORAL | 12 refills | Status: DC

## 2016-05-27 MED ORDER — ORAL CARE MOUTH RINSE
15.0000 mL | Freq: Two times a day (BID) | OROMUCOSAL | Status: DC
  Administered 2016-05-27 – 2016-05-28 (×3): 15 mL via OROMUCOSAL

## 2016-05-27 MED ORDER — PRASUGREL HCL 10 MG PO TABS
30.0000 mg | ORAL_TABLET | Freq: Once | ORAL | Status: AC
  Administered 2016-05-27: 30 mg via ORAL
  Filled 2016-05-27: qty 3

## 2016-05-27 MED ORDER — PNEUMOCOCCAL VAC POLYVALENT 25 MCG/0.5ML IJ INJ
0.5000 mL | INJECTION | INTRAMUSCULAR | Status: AC
  Administered 2016-05-28: 0.5 mL via INTRAMUSCULAR
  Filled 2016-05-27: qty 0.5

## 2016-05-27 MED ORDER — PRASUGREL HCL 10 MG PO TABS
10.0000 mg | ORAL_TABLET | Freq: Every day | ORAL | Status: DC
  Administered 2016-05-28: 10 mg via ORAL
  Filled 2016-05-27: qty 1

## 2016-05-27 MED FILL — Heparin Sodium (Porcine) 2 Unit/ML in Sodium Chloride 0.9%: INTRAMUSCULAR | Qty: 500 | Status: AC

## 2016-05-27 NOTE — Progress Notes (Signed)
    Called by RN, Merleen Nicely, due to SOB with crackles on lung exam. Switched from [facility] to [facility] today due to SOB. He has EF of 20-25% and on no diuretic. Will give him lasix '40mg'$  x1 now and see how he does.   Angelena Form PA-C  [facility]

## 2016-05-27 NOTE — Care Management Note (Addendum)
Case Management Note  Patient Details  Name: [name] MRN: [id number] Date of Birth: [birth year]  Subjective/Objective:     Adm w mi               Action/Plan: lives at home w fam   Expected Discharge Date:                  Expected Discharge Plan:  Home/Self Care  In-House Referral:     Discharge planning Services  CM Consult, Medication Assistance  Post Acute Care Choice:    Choice offered to:     DME Arranged:    DME Agency:     HH Arranged:    HH Agency:     Status of Service:  In process, will continue to follow  If discussed at Long Length of Stay Meetings, dates discussed:    Additional Comments: gave pt 30day free brilinta card. [facility] medicare ins. Changed to effient. Gave pt 30day free effient card.  Lacretia Leigh, RN 05/27/2016, 9:55 AM

## 2016-05-27 NOTE — Progress Notes (Signed)
Subjective:  Feels short of breath intermittently. Some chest tightness with breathing. No other complaints.   Objective:  Vital Signs in the last 24 hours: Temp:  [97.9 F (36.6 C)-99.5 F (37.5 C)] 98.1 F (36.7 C) (08/31 0744) Pulse Rate:  [90-109] 94 (08/31 0800) Resp:  [16-36] 32 (08/31 0800) BP: (103-153)/(65-104) 103/68 (08/31 0800) SpO2:  [89 %-98 %] 95 % (08/31 0800)  Intake/Output from previous day: 08/30 0701 - 08/31 0700 In: 3957.5 [P.O.:660; I.V.:3297.5] Out: 1825 [Urine:1825]  Physical Exam: Pt is alert and oriented, NAD HEENT: normal Neck: JVP - normal, carotids 2+=  Lungs: CTA bilaterally CV: RRR without murmur or gallop Abd: soft, NT, Positive BS, no hepatomegaly Ext: right BKA, no left leg swelling Skin: warm/dry no rash   Lab Results:  Recent Labs  05/26/16 0112 05/26/16 0132  WBC 18.8*  --   HGB 15.9 17.0  PLT 338  --     Recent Labs  05/26/16 0112 05/26/16 0132 05/26/16 0459  NA 134* 134* 135  K 3.4* 3.2* 3.8  CL 97* 99* 98*  CO2 24  --  29  GLUCOSE 162* 160* 133*  BUN '11 14 11  '$ CREATININE 1.14 1.10 1.15    Recent Labs  05/26/16 0112 05/26/16 0459  TROPONINI 0.07* >65.00*    Cardiac Studies: Echo: Study Conclusions  - Left ventricle: Systolic function was severely reduced. The   estimated ejection fraction was in the range of 20% to 25%.   Akinesis of the mid-apicalanteroseptal, inferior, and apical   myocardium. Doppler parameters are consistent with abnormal left   ventricular relaxation (grade 1 diastolic dysfunction). - Pericardium, extracardiac: A trivial pericardial effusion was   identified.  Impressions:  - This echo is extremely difficult.   The valve are not seen well enough to assess.   Definity contrast reveals anterior apical and inferior apical   akinesis.   The echo quality is not sufficient to assess other wall motion  Cath: Conclusion     A STENT XIENCE ALPINE RX 3.5X18 drug eluting  stent was successfully placed, and does not overlap previously placed stent.  Prox LAD lesion, 100 %stenosed.  Post intervention, there is a 0% residual stenosis.  LV end diastolic pressure is mildly elevated.  There is mild to moderate left ventricular systolic dysfunction.   Acute ST segment elevation anterior wall myocardial infarction secondary to total occlusion of the proximal LAD with initial TIMI 0 flow.  Normal left circumflex and right coronary arteries.  There was faint collateralization from the RCA to septal perforating arteries in the proximal LAD.  Moderate acute LV dysfunction with hypokinesis of the mid distal anterolateral wall.  Ejection fraction is 40%.  Successful PCI of the totally occluded proximal LAD with ultimate insertion of a 3.518 mm Xience Alpine DES stent postdilated to 3.72 mm with the 100% occlusion being reduced to 0 and initialTIMI 0 flow improved to TIMI-3 flow.  RECOMMENDATION: The patient will need dual antiplatelet therapy for a minimum of a year.  Medical therapy will be implemented including ACE inhibition, beta blocker therapy, and high potency statin therapy.  Smoking cessation is imperative.      Tele: Sinus rhythm  Assessment/Plan:  1. Anterior STEMI s/p Primary PCI with drug-eluting stent 2. Tobacco abuse - on nicotine patch 3. PAD s/p amputation 4. Hyperlipidemia 5. Shortness of breath - probably exacerbated by brilinta  Echo reviewed with poor image quality. LVEF severely depressed but ventriculography showed LVEF 40. Will switch him  to Effient because I suspect brilinta is contributing to shorntess of breath and this is likely to continue to be an issue for this longtime smoker. No hx stroke or TIA, no contraindication to effient. Tx tele today. Continue post-MI medical therapy with ASA, ACE, beta-blocker, statin. Case manager for Effient.    [name], M.D. 05/27/2016, 9:43 AM

## 2016-05-27 NOTE — Progress Notes (Signed)
Pt admitted to the floor from [facility].  Upon arrival, pt was extremely short of breath and it was hard for him to hold a conversations.  expiratory wheezes and fine crackles heard upon exam.  VSS, MD paged, waiting to hear back.  Will continue to closely monitor.

## 2016-05-27 NOTE — Progress Notes (Signed)
CARDIAC REHAB PHASE I   PRE:  Rate/Rhythm: 93 SR    BP: sitting 92/69    SaO2: 92 RA  MODE:  Ambulation: 170 ft   POST:  Rate/Rhythm: 107 ST    BP: sitting 110/74     SaO2: 95 RA  Pt sts he is still SOB but eager to get up. Able to apply prosthesis and walk with RW. He initially did not want RW however sts he has fallen in past and was slightly unsteady so therefore had him use it. SOB present but pt sts he felt better walking and sitting up (to recliner). Ed completed with pt and wife except ex gl. Pt wife with many questions and anxiety. Encouraged them to read information and watch video. Encouraged more walking today. [place], ACSM 05/27/2016 12:41 PM

## 2016-05-27 NOTE — Care Management (Addendum)
1656 05-27-16 CM was notified by Cardiac Rehab RN that pt will transfer to 3w from 2h and has an expired effient card. This CM did have an efient card that expires 05-27-16. This CM did call [facility] on [address] to see if ok to send card today in regards to 30 day free so patient will at least get 30 days free. CM did fax information to [facility]. CM did ask PA to send Rx in as DAW1 Brand in order for patient to get a free 30 day.   CM did call Effient Rep Remo Lipps and he stated medication will be going generic and some offices have samples. CM will call Cardiology office on 05-28-16 to see if samples are available. Generic will cost $45.00. CM will continue to monitor.

## 2016-05-28 ENCOUNTER — Telehealth: Payer: Self-pay | Admitting: Physician Assistant

## 2016-05-28 ENCOUNTER — Encounter (HOSPITAL_COMMUNITY): Payer: Self-pay | Admitting: *Deleted

## 2016-05-28 DIAGNOSIS — I739 Peripheral vascular disease, unspecified: Secondary | ICD-10-CM | POA: Diagnosis not present

## 2016-05-28 DIAGNOSIS — Z89511 Acquired absence of right leg below knee: Secondary | ICD-10-CM | POA: Diagnosis not present

## 2016-05-28 DIAGNOSIS — I959 Hypotension, unspecified: Secondary | ICD-10-CM | POA: Diagnosis not present

## 2016-05-28 DIAGNOSIS — Z79899 Other long term (current) drug therapy: Secondary | ICD-10-CM | POA: Diagnosis not present

## 2016-05-28 DIAGNOSIS — Z7982 Long term (current) use of aspirin: Secondary | ICD-10-CM | POA: Diagnosis not present

## 2016-05-28 DIAGNOSIS — Z888 Allergy status to other drugs, medicaments and biological substances status: Secondary | ICD-10-CM | POA: Diagnosis not present

## 2016-05-28 DIAGNOSIS — Z23 Encounter for immunization: Secondary | ICD-10-CM | POA: Diagnosis not present

## 2016-05-28 DIAGNOSIS — E785 Hyperlipidemia, unspecified: Secondary | ICD-10-CM | POA: Diagnosis not present

## 2016-05-28 DIAGNOSIS — Z86718 Personal history of other venous thrombosis and embolism: Secondary | ICD-10-CM | POA: Diagnosis not present

## 2016-05-28 DIAGNOSIS — J45909 Unspecified asthma, uncomplicated: Secondary | ICD-10-CM | POA: Diagnosis not present

## 2016-05-28 DIAGNOSIS — I5021 Acute systolic (congestive) heart failure: Secondary | ICD-10-CM | POA: Diagnosis not present

## 2016-05-28 DIAGNOSIS — Z955 Presence of coronary angioplasty implant and graft: Secondary | ICD-10-CM | POA: Diagnosis not present

## 2016-05-28 DIAGNOSIS — G4700 Insomnia, unspecified: Secondary | ICD-10-CM | POA: Diagnosis not present

## 2016-05-28 DIAGNOSIS — I2102 ST elevation (STEMI) myocardial infarction involving left anterior descending coronary artery: Secondary | ICD-10-CM | POA: Diagnosis not present

## 2016-05-28 MED ORDER — CARVEDILOL 3.125 MG PO TABS: 3.1250 mg | ORAL_TABLET | Freq: Two times a day (BID) | ORAL | 12 refills | Status: DC

## 2016-05-28 MED ORDER — NITROGLYCERIN 0.4 MG SL SUBL: 0.4000 mg | SUBLINGUAL_TABLET | SUBLINGUAL | 2 refills | Status: DC | PRN

## 2016-05-28 MED ORDER — NICOTINE 14 MG/24HR TD PT24: 14.0000 mg | MEDICATED_PATCH | Freq: Every day | TRANSDERMAL | 0 refills | Status: DC

## 2016-05-28 MED ORDER — LISINOPRIL 2.5 MG PO TABS: 2.5000 mg | ORAL_TABLET | Freq: Every day | ORAL | 12 refills | Status: DC

## 2016-05-28 MED ORDER — ATORVASTATIN CALCIUM 80 MG PO TABS: 80.0000 mg | ORAL_TABLET | Freq: Every day | ORAL | 12 refills | Status: DC

## 2016-05-28 NOTE — Telephone Encounter (Signed)
New message       TCM appt on 06-07-16 with Almyra Deforest

## 2016-05-28 NOTE — Progress Notes (Signed)
Discharge instruction reviewed with patient's wife. Patient to be discharged home with wife. Lenna Sciara, RN 05/28/2016

## 2016-05-28 NOTE — Care Management Important Message (Signed)
Important Message  Patient Details  Name: [name] MRN: [id number] Date of Birth: 05-Apr-1961   Medicare Important Message Given:  Yes    Taiyo Kozma Abena 05/28/2016, 10:08 AM

## 2016-05-28 NOTE — Discharge Summary (Signed)
Discharge Summary    Patient ID: [name],  MRN: [id number], DOB/AGE: Jul 17, 1961 55 y.o.  Admit date: 05/26/2016 Discharge date: 05/28/2016  Primary Care Provider: Glo Costa Primary Cardiologist: Dr. Oval Costa  Discharge Diagnoses    Active Problems:   Acute ST elevation myocardial infarction (STEMI) of lateral wall (HCC)   Acute ST elevation myocardial infarction (STEMI) involving left anterior descending (LAD) coronary artery (HCC)   Allergies Allergies  Allergen Reactions  . Lyrica [Pregabalin] Palpitations    Chest pain and heart palps.  . Neurontin [Gabapentin] Palpitations    Chest pain and heart palp    Diagnostic Studies/Procedures   Coronary Stent Intervention  Left Heart Cath and Coronary Angiography 05/26/16   A STENT XIENCE ALPINE RX [id number] drug eluting stent was successfully placed, and does not overlap previously placed stent.  Prox LAD lesion, 100 %stenosed.  Post intervention, there is a 0% residual stenosis.  LV end diastolic pressure is mildly elevated.  There is mild to moderate left ventricular systolic dysfunction.   Acute ST segment elevation anterior wall myocardial infarction secondary to total occlusion of the proximal LAD with initial TIMI 0 flow.  Normal left circumflex and right coronary arteries.  There was faint collateralization from the RCA to septal perforating arteries in the proximal LAD.  Moderate acute LV dysfunction with hypokinesis of the mid distal anterolateral wall.  Ejection fraction is 40%.  Successful PCI of the totally occluded proximal LAD with ultimate insertion of a 3.518 mm Xience Alpine DES stent postdilated to 3.72 mm with the 100% occlusion being reduced to 0 and initialTIMI 0 flow improved to TIMI-3 flow.  RECOMMENDATION: The patient will need dual antiplatelet therapy for a minimum of a year.  Medical therapy will be implemented including ACE inhibition, beta blocker therapy, and high potency  statin therapy.  Smoking cessation is imperative.  Echo 05/26/16 Study Conclusions  - Left ventricle: Systolic function was severely reduced. The   estimated ejection fraction was in the range of 20% to 25%.   Akinesis of the mid-apicalanteroseptal, inferior, and apical   myocardium. Doppler parameters are consistent with abnormal left   ventricular relaxation (grade 1 diastolic dysfunction). - Pericardium, extracardiac: A trivial pericardial effusion was   identified.  Impressions:  - This echo is extremely difficult.   The valve are not seen well enough to assess.   Definity contrast reveals anterior apical and inferior apical   akinesis.   The echo quality is not sufficient to assess other wall motion   _____________   History of Present Illness   Mr. [name] is a 55 year old male with a past medical history of HCV, PAD s/p right femoral - tibial bypass followed by BKA, and current tobacco abuse. He presented to the ED on 05/26/16 with substernal chest pain with associated diaphoresis and nausea. His EKG showed anterolateral ST elevation and code STEMI was activated.   Hospital Course  Mr. [name] was taken urgently to the cath lab. He underwent successful PCI of totally occluded proximal LAD with insertion of DES with achievement of TIMI - 3 flow. His troponin peaked at >65.   He will need DAPT for a minimum of one year with ASA and Effient. He was initially started on Brilinta but developed dyspnea and ultimately could not tolerate it. His SOB improved with the switch to Effient.   LVEF by ventriculography is 35-40%, LVEF by Echo is less than 35%, but echo images were very limited by poor  acoustic windows. He was started on ACE-I and low dose Coreg. Hypotension prevented increasing his medications further.    He has a history of heavy tobacco abuse, cessation was encouraged, and he will continue to wear nicotine patch which he is tolerating well.   We will follow up with him in 10  days at our office.   He was seen today by [name] and deemed suitable for discharge.  _____________  Discharge Vitals Blood pressure 99/73, pulse 94, temperature 98.6 F (37 C), temperature source Oral, resp. rate 15, height 6' (1.829 m), weight 226 lb 8 oz (102.7 kg), SpO2 99 %.  Filed Weights   05/26/16 0305 05/28/16 0429  Weight: 233 lb 14.5 oz (106.1 kg) 226 lb 8 oz (102.7 kg)    Labs & Radiologic Studies     CBC  Recent Labs  05/26/16 0112 05/26/16 0132  WBC 18.8*  --   NEUTROABS 12.3*  --   HGB 15.9 17.0  HCT 47.6 50.0  MCV 94.3  --   PLT 338  --    Basic Metabolic Panel  Recent Labs  05/26/16 0112 05/26/16 0132 05/26/16 0459  NA 134* 134* 135  K 3.4* 3.2* 3.8  CL 97* 99* 98*  CO2 24  --  29  GLUCOSE 162* 160* 133*  BUN '11 14 11  '$ CREATININE 1.14 1.10 1.15  CALCIUM 8.9  --  8.8*   Liver Function Tests  Recent Labs  05/26/16 0112 05/26/16 0459  AST 21 265*  ALT 20 48  ALKPHOS 72 71  BILITOT 0.6 0.4  PROT 6.7 6.4*  ALBUMIN 4.0 3.8   Cardiac Enzymes  Recent Labs  05/26/16 0112 05/26/16 0459  TROPONINI 0.07* >65.00*   Fasting Lipid Panel  Recent Labs  05/26/16 0459  CHOL 154  HDL 44  LDLCALC 75  TRIG 177*  CHOLHDL 3.5     Disposition   Pt is being discharged home today in good condition.  Follow-up Plans & Appointments    Follow-up Information    [name], [place] Follow up on 06/07/2016.   Specialties:  Cardiology, Radiology Why:  at 10:00 am for hospital follow up.  Contact information: [address] [place] [address] [phone]          Discharge Instructions    Amb Referral to Cardiac Rehabilitation    Complete by:  As directed   Diagnosis:   PTCA STEMI Coronary Stents     Diet - low sodium heart healthy    Complete by:  As directed   Discharge instructions    Complete by:  As directed   NO HEAVY LIFTING OR SEXUAL ACTIVITY for 7 DAYS. NO DRIVING for 3-5 DAYS. NO SOAKING BATHS, HOT  TUBS, POOLS, ETC., for 7 DAYS   Increase activity slowly    Complete by:  As directed      Discharge Medications   Current Discharge Medication List    START taking these medications   Details  atorvastatin (LIPITOR) 80 MG tablet Take 1 tablet (80 mg total) by mouth daily at 6 PM. Qty: 30 tablet, Refills: 12    carvedilol (COREG) 3.125 MG tablet Take 1 tablet (3.125 mg total) by mouth 2 (two) times daily with a meal. Qty: 60 tablet, Refills: 12    lisinopril (PRINIVIL,ZESTRIL) 2.5 MG tablet Take 1 tablet (2.5 mg total) by mouth daily. Qty: 30 tablet, Refills: 12    nicotine (NICODERM CQ - DOSED IN MG/24 HOURS) 14 mg/24hr patch  Place 1 patch (14 mg total) onto the skin daily. Qty: 28 patch, Refills: 0    nitroGLYCERIN (NITROSTAT) 0.4 MG SL tablet Place 1 tablet (0.4 mg total) under the tongue every 5 (five) minutes x 3 doses as needed for chest pain. Qty: 25 tablet, Refills: 2    prasugrel (EFFIENT) 10 MG TABS tablet Take 1 tablet (10 mg total) by mouth daily. Qty: 30 tablet, Refills: 12      CONTINUE these medications which have NOT CHANGED   Details  alprazolam (XANAX) 2 MG tablet Take 2 mg by mouth 4 (four) times daily as needed for anxiety.     amitriptyline (ELAVIL) 50 MG tablet Take 50 mg by mouth at bedtime.    aspirin 81 MG EC tablet Take 81 mg by mouth daily.      citalopram (CELEXA) 40 MG tablet Take 40 mg by mouth daily. Refills: 11      STOP taking these medications     HYDROcodone-acetaminophen (NORCO) 10-325 MG per tablet          Aspirin prescribed at discharge?  Yes High Intensity Statin Prescribed? (Lipitor 40-'80mg'$  or Crestor 20-'40mg'$ ): Yes Beta Blocker Prescribed? Yes For EF 40% or less, Was ACEI/ARB Prescribed? Yes ADP Receptor Inhibitor Prescribed? (i.e. Plavix etc.-Includes Medically Managed Patients): Yes For EF <40%, Aldosterone Inhibitor Prescribed? No: hypotension Was EF assessed during THIS hospitalization? Yes Was Cardiac Rehab II  ordered? (Included Medically managed Patients): Yes   Outstanding Labs/Studies   BMP  Duration of Discharge Encounter   Greater than 30 minutes including physician time.  Signed, [name] NP 05/28/2016, 11:57 AM

## 2016-05-28 NOTE — Progress Notes (Signed)
Clarified with Dr. Burt Knack to hold patients coreg and lisinopril this am. MD stated to hold for a systolic less than 728. Will continue to monitor patient.

## 2016-05-28 NOTE — Progress Notes (Signed)
    Subjective:  Feeling much better today. Breathing is improved. No chest pain. Eager to go home.  Objective:  Vital Signs in the last 24 hours: Temp:  [98.6 F (37 C)-99.9 F (37.7 C)] 98.6 F (37 C) (09/01 0429) Pulse Rate:  [92-110] 94 (09/01 1018) Resp:  [15-23] 15 (09/01 0429) BP: (99-122)/(69-87) 99/73 (09/01 1018) SpO2:  [93 %-99 %] 99 % (09/01 0429) Weight:  [102.7 kg (226 lb 8 oz)] 102.7 kg (226 lb 8 oz) (09/01 0429)  Intake/Output from previous day: 08/31 0701 - 09/01 0700 In: 220 [P.O.:120; I.V.:100] Out: 1650 [Urine:1650]  Physical Exam: Pt is alert and oriented, NAD HEENT: normal Neck: JVP - normal Lungs: Diffuse rhonchi bilaterally CV: RRR without murmur or gallop Abd: soft, NT, Positive BS, no hepatomegaly Ext: Right BKA, no edema on the left Skin: warm/dry no rash   Lab Results:  Recent Labs  05/26/16 0112 05/26/16 0132  WBC 18.8*  --   HGB 15.9 17.0  PLT 338  --     Recent Labs  05/26/16 0112 05/26/16 0132 05/26/16 0459  NA 134* 134* 135  K 3.4* 3.2* 3.8  CL 97* 99* 98*  CO2 24  --  29  GLUCOSE 162* 160* 133*  BUN '11 14 11  '$ CREATININE 1.14 1.10 1.15    Recent Labs  05/26/16 0112 05/26/16 0459  TROPONINI 0.07* >65.00*   Tele: Normal sinus rhythm  Assessment/Plan:  1. Anterior STEMI: The patient is progressing well. It appears his shortness of breath was primarily related to brilinta. He did receive 1 dose of IV Lasix yesterday as well. He is much improved today. We have transitioned him to Effient. I think he is stable for discharge on aspirin, Effient, low-dose carvedilol, low-dose lisinopril, and a high intensity statin drug. The patient has been given a 30 day free Effient card. He understands the importance of adherence to dual antiplatelet therapy and post MI medical therapy. Will arrange close outpatient follow-up.  2. Acute systolic heart failure, secondary to #1. He has improved with IV diuresis. Continue current medical  therapy as outlined above. He has moderate to severe LV dysfunction. LVEF by ventriculography is 35-40%. LVEF by echo is less than 35%, but echo images were very limited by poor acoustic windows.  3. PAD with history of amputation: Stable  4. Heavy tobacco abuse: Patient tolerating a nicotine patch. Would continue at discharge. Tobacco cessation counseling done.  5. Hyperlipidemia: Continue atorvastatin 80 mg daily.  Sherren Mocha, M.D. 05/28/2016, 10:48 AM

## 2016-05-28 NOTE — Progress Notes (Signed)
CARDIAC REHAB PHASE I   PRE:  Rate/Rhythm: 88 SR  BP:  Sitting: 99/73        SaO2: 96 RA  MODE:  Ambulation: 320 ft   POST:  Rate/Rhythm: 100 SR  BP:  Sitting: 112/70         SaO2: 98 RA  Pt ambulated 320 ft on RA, rolling walker, prosthesis, assist x1, fairly steady gait, tolerated fairly well. Pt c/o DOE, states it is much improved since yesterday, denies cp, dizziness, declined rest stop. Reviewed education, exercise guidelines, pt verbalized understanding. Pt to edge of bed per pt request after walk, call bell within reach. Will follow.    1010-1040 Lenna Sciara, RN, BSN 05/28/2016 10:40 AM

## 2016-06-01 NOTE — Telephone Encounter (Signed)
Patient contacted regarding discharge from [facility] on 9/1.  Patient understands to follow up with provider Almyra Deforest on 9/11 at 10am at [facility]. Patient understands discharge instructions? Yes Patient understands medications and regiment? Yes Patient understands to bring all medications to this visit? Yes

## 2016-06-01 NOTE — Telephone Encounter (Signed)
Line busy when dialed. 

## 2016-06-04 ENCOUNTER — Encounter: Payer: Self-pay | Admitting: *Deleted

## 2016-06-04 DIAGNOSIS — G894 Chronic pain syndrome: Secondary | ICD-10-CM | POA: Diagnosis not present

## 2016-06-04 DIAGNOSIS — I2510 Atherosclerotic heart disease of native coronary artery without angina pectoris: Secondary | ICD-10-CM | POA: Diagnosis not present

## 2016-06-04 DIAGNOSIS — Z1389 Encounter for screening for other disorder: Secondary | ICD-10-CM | POA: Diagnosis not present

## 2016-06-07 ENCOUNTER — Encounter: Payer: Medicare Other | Admitting: Physician Assistant

## 2016-06-07 ENCOUNTER — Encounter: Payer: Self-pay | Admitting: *Deleted

## 2016-06-07 NOTE — Progress Notes (Signed)
Patient is no show    ROS   This encounter was created in error - please disregard.

## 2016-06-28 ENCOUNTER — Other Ambulatory Visit: Payer: Self-pay | Admitting: Physician Assistant

## 2016-06-28 ENCOUNTER — Ambulatory Visit (INDEPENDENT_AMBULATORY_CARE_PROVIDER_SITE_OTHER): Payer: Medicare Other | Admitting: Physician Assistant

## 2016-06-28 ENCOUNTER — Encounter: Payer: Self-pay | Admitting: Physician Assistant

## 2016-06-28 VITALS — BP 101/68 | HR 67 | Ht 73.0 in | Wt 240.2 lb

## 2016-06-28 DIAGNOSIS — I5022 Chronic systolic (congestive) heart failure: Secondary | ICD-10-CM | POA: Diagnosis not present

## 2016-06-28 DIAGNOSIS — E785 Hyperlipidemia, unspecified: Secondary | ICD-10-CM | POA: Diagnosis not present

## 2016-06-28 DIAGNOSIS — F1010 Alcohol abuse, uncomplicated: Secondary | ICD-10-CM

## 2016-06-28 DIAGNOSIS — I2510 Atherosclerotic heart disease of native coronary artery without angina pectoris: Secondary | ICD-10-CM | POA: Diagnosis not present

## 2016-06-28 DIAGNOSIS — I255 Ischemic cardiomyopathy: Secondary | ICD-10-CM | POA: Diagnosis not present

## 2016-06-28 DIAGNOSIS — I739 Peripheral vascular disease, unspecified: Secondary | ICD-10-CM

## 2016-06-28 DIAGNOSIS — Z720 Tobacco use: Secondary | ICD-10-CM

## 2016-06-28 LAB — BASIC METABOLIC PANEL WITH GFR
BUN: 16 mg/dL (ref 7–25)
CHLORIDE: 101 mmol/L (ref 98–110)
CO2: 28 mmol/L (ref 20–31)
Calcium: 9.2 mg/dL (ref 8.6–10.3)
Creat: 1.13 mg/dL (ref 0.70–1.33)
GFR, Est African American: 85 mL/min (ref >=60)
GFR, Est Non African American: 74 mL/min (ref >=60)
Glucose, Bld: 80 mg/dL (ref 65–99)
POTASSIUM: 5.4 mmol/L — AB (ref 3.5–5.3)
Sodium: 136 mmol/L (ref 135–146)

## 2016-06-28 NOTE — Patient Instructions (Signed)
Labwork:  Your physician recommends that you return for lab work today--BMP; and in 2 months for Hepatic function panel and fasting lipid (nothing to eat or drink past midnight the night before)   Other Instructions:  Stop drinking---gradually start cutting back on the number of alcoholic beverages you have in a day.   Follow-Up:  Your physician recommends that you schedule a follow-up appointment in: 1-2 months with Dr. Oval Linsey.  If you need a refill on your cardiac medications before your next appointment, please call your pharmacy.

## 2016-06-28 NOTE — Progress Notes (Addendum)
Cardiology Office Note    Date:  06/28/2016   ID:  [name], DOB 07-20-1961, MRN [id number]  PCP:  Glo Herring., MD  Cardiologist:  Dr. Oval Linsey  Chief Complaint  Patient presents with  . Hospitalization Follow-up    seen for Dr. Oval Linsey, post PCI after anterior STEMI    History of Present Illness:  [name] is a 55 y.o. male with PMH of HCV, reactive air disease, PAD s/p R fem-tibial bypass followed by R BKA, and tobacco abuse who presented to [facility] on 05/26/2016 shortly after midnight with substernal chest pain. Initial EKG obtained in the emergency room showed anterolateral ST elevation, he was loaded on heparin and then underwent emergent cardiac catheterization. Cardiac cath performed on 05/26/2016 showed 100% proximal LAD occlusion treated with 3.5 x 18 mm Xience Alpine DES, EF 40%, normal left circumflex and the right coronary system, faint collateral artery from RCA to septal perforator artery in proximal LAD. His troponin did increase to greater than 65. Postprocedure, he was placed on aspirin and Brilinta. Lipid panel obtained on the same day showed cholesterol 154, triglyceride 177, HDL 44, LDL 75. Echocardiogram obtained on the same day was very difficult study, the estimated EF was 20-25% with multiple wall motion abnormality, grade 1 diastolic dysfunction. He did have some shortness of breath, his Brilinta was switched to Effient. He was also placed on low-dose carvedilol and lisinopril due to significant LV dysfunction. He was eventually discharged on 05/28/2016. Surprisingly, according to the patient, both of his parents are alive and does not have any cardiac issues, however he does have family history of heart attack in distant relatives including paternal uncle.  He presents today for follow-up, we have went over his recent cath films. His blood pressure is well controlled on current medication including carvedilol and lisinopril. We were unable to  uptitrate the current medication due to borderline BP. Otherwise he has been feeling well and denies any chest pain or significant shortness of breath. As for his angina, he did not have any obvious chest pain per se, his main symptom was severe diaphoresis. He has already quit smoking, however he still drinks about 12 pack per day. In order to avoid withdrawal symptom, I have ask him to start weaning off alcohol. Otherwise he has been compliant with his medication. We also discussed and placed a strong emphasis on the need to be compliant with DAPT given the location of his stent. We have given him discount card for the effient. I have advised the patient to monitor for any exertional-type symptom. He has also been advised to contact cardiology if he is ever at risk of running out of Effient.    Past Medical History:  Diagnosis Date  . Anxiety   . Asthma   . Depression   . DVT (deep venous thrombosis) ([place]) 2009  . Hepatitis C    no prior treatment  -   did treatment 2015  . STEMI (ST elevation myocardial infarction) ([place]) 05/26/2016  . Tobacco abuse 01/28/2016    Past Surgical History:  Procedure Laterality Date  . AORTOGRAM  08/08/2009   for LLE claudication     By Dr. Oneida Alar  . 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 BIOPSY N/A 05/30/2014   Procedure: BIOPSY;  Surgeon: [name], MD;  Location: [facility];  Service: Endoscopy;  Laterality: N/A;  . BLADDER TUMOR EXCISION  8/812  . CARDIAC CATHETERIZATION N/A  05/26/2016   Procedure: Left Heart Cath and Coronary Angiography;  Surgeon: [name], MD;  Location: [place] [facility];  Service: Cardiovascular;  Laterality: N/A;  . CARDIAC CATHETERIZATION N/A 05/26/2016   Procedure: Coronary Stent Intervention;  Surgeon: [name], MD;  Location: [place] [facility];  Service: Cardiovascular;  Laterality: N/A;  . CHOLECYSTECTOMY    . COLONOSCOPY WITH PROPOFOL N/A 05/30/2014   ZYS:AYTKZS colonic polyp-likely source of  hematochezia-removed as described above  . ESOPHAGOGASTRODUODENOSCOPY (EGD) WITH PROPOFOL N/A 05/30/2014   WFU:XNATFT and bulbar erosions s/p gastric biopsy. No evidence of portal gastropathy on today's examination.  . FEMORAL-TIBIAL BYPASS GRAFT  2009   Right side using non-reversed GSV   By Dr. Oneida Alar  . FEMORAL-TIBIAL BYPASS GRAFT  11/24/2007   Right femoral to anterior tibial BPG   by Dr. Oneida Alar  . HAND TENDON SURGERY Left 2013   Left 5th finger  . HERNIA REPAIR    . INCISIONAL HERNIA REPAIR N/A 12/25/2014   Procedure: HERNIA REPAIR INCISIONAL WITH MESH;  Surgeon: [name], MD;  Location: [facility];  Service: General;  Laterality: N/A;  . INSERTION OF MESH N/A 12/25/2014   Procedure: INSERTION OF MESH;  Surgeon: [name], MD;  Location: [facility];  Service: General;  Laterality: N/A;  . LOWER EXTREMITY ANGIOGRAM Left 12/30/2015   Procedure: Lower Extremity Angiogram;  Surgeon: [name], MD;  Location: [place] [facility];  Service: Cardiovascular;  Laterality: Left;  . PERIPHERAL VASCULAR CATHETERIZATION N/A 12/30/2015   Procedure: Abdominal Aortogram;  Surgeon: [name], MD;  Location: [place] [facility];  Service: Cardiovascular;  Laterality: N/A;  . POLYPECTOMY  05/30/2014   Procedure: POLYPECTOMY;  Surgeon: [name], MD;  Location: [facility];  Service: Endoscopy;;  . TONSILLECTOMY      Current Medications: Outpatient Medications Prior to Visit  Medication Sig Dispense Refill  . alprazolam (XANAX) 2 MG tablet Take 2 mg by mouth 4 (four) times daily as needed for anxiety.     [name] amitriptyline (ELAVIL) 50 MG tablet Take 50 mg by mouth at bedtime.    [name] aspirin 81 MG EC tablet Take 81 mg by mouth daily.      [name] atorvastatin (LIPITOR) 80 MG tablet Take 1 tablet (80 mg total) by mouth daily at 6 PM. 30 tablet 12  . carvedilol (COREG) 3.125 MG tablet Take 1 tablet (3.125 mg total) by mouth 2 (two) times daily with a meal. 60 tablet 12  . citalopram (CELEXA) 40 MG tablet  Take 40 mg by mouth daily.  11  . lisinopril (PRINIVIL,ZESTRIL) 2.5 MG tablet Take 1 tablet (2.5 mg total) by mouth daily. 30 tablet 12  . nicotine (NICODERM CQ - DOSED IN MG/24 HOURS) 14 mg/24hr patch Place 1 patch (14 mg total) onto the skin daily. 28 patch 0  . nitroGLYCERIN (NITROSTAT) 0.4 MG SL tablet Place 1 tablet (0.4 mg total) under the tongue every 5 (five) minutes x 3 doses as needed for chest pain. 25 tablet 2  . prasugrel (EFFIENT) 10 MG TABS tablet Take 1 tablet (10 mg total) by mouth daily. 30 tablet 12   No facility-administered medications prior to visit.      Allergies:   Lyrica [pregabalin] and Neurontin [gabapentin]   Social History   Social History  . Marital status: Married    Spouse name: N/A  . Number of children: N/A  . Years of education: N/A   Social History Main  Topics  . Smoking status: Former Smoker    Packs/day: 0.00    Types: Cigarettes    Quit date: 05/27/2016  . Smokeless tobacco: Never Used     Comment: cigarettes  . Alcohol use No     Comment: Patient quit drinking about a year ago; history of heavy ETOH use...has a beer now and then  . Drug use:     Types: Marijuana     Comment: marijuana-last used 12/2015  . Sexual activity: Not Asked   Other Topics Concern  . None   Social History Narrative  . None     Family History:  The patient's family history includes Ulcers in his father; Vision loss in his mother.   ROS:   Please see the history of present illness.    ROS All other systems reviewed and are negative.   PHYSICAL EXAM:   VS:  BP 101/68   Pulse 67   Ht '6\' 1"'$  (1.854 m)   Wt 240 lb 3.2 oz (109 kg)   BMI 31.69 kg/m    GEN: Well nourished, well developed, in no acute distress  HEENT: normal  Neck: no JVD, carotid bruits, or masses Cardiac: RRR; no murmurs, rubs, or gallops,no edema   Respiratory:  clear to auscultation bilaterally, normal work of breathing GI: soft, nontender, nondistended, + BS MS: Right BKA with  prosthesis noted. No swelling noted on the left side. Skin: warm and dry, no rash Neuro:  Alert and Oriented x 3, Strength and sensation are intact Psych: euthymic mood, full affect  Wt Readings from Last 3 Encounters:  06/28/16 240 lb 3.2 oz (109 kg)  05/28/16 226 lb 8 oz (102.7 kg)  03/04/16 235 lb (106.6 kg)      Studies/Labs Reviewed:   EKG:  EKG is ordered today.  The ekg ordered today demonstrates Normal sinus rhythm with poor R-wave progression in the anterior lead, ST elevation in the anterior lead has significantly improved compared to the previous EKG.  Recent Labs: 05/26/2016: ALT 48; BUN 11; Creatinine, Ser 1.15; Hemoglobin 17.0; Platelets 338; Potassium 3.8; Sodium 135   Lipid Panel    Component Value Date/Time   CHOL 154 05/26/2016 0459   TRIG 177 (H) 05/26/2016 0459   HDL 44 05/26/2016 0459   CHOLHDL 3.5 05/26/2016 0459   VLDL 35 05/26/2016 0459   LDLCALC 75 05/26/2016 0459    Additional studies/ records that were reviewed today include:   Cath 05/26/2016 Conclusion      A STENT XIENCE ALPINE RX 3.5X18 drug eluting stent was successfully placed, and does not overlap previously placed stent.  Prox LAD lesion, 100 %stenosed.  Post intervention, there is a 0% residual stenosis.  LV end diastolic pressure is mildly elevated.  There is mild to moderate left ventricular systolic dysfunction.   Acute ST segment elevation anterior wall myocardial infarction secondary to total occlusion of the proximal LAD with initial TIMI 0 flow.   Normal left circumflex and right coronary arteries.  There was faint collateralization from the RCA to septal perforating arteries in the proximal LAD.   Moderate acute LV dysfunction with hypokinesis of the mid distal anterolateral wall.  Ejection fraction is 40%.   Successful PCI of the totally occluded proximal LAD with ultimate insertion of a 3.518 mm Xience Alpine DES stent postdilated to 3.72 mm with the 100% occlusion  being reduced to 0 and initialTIMI 0 flow improved to TIMI-3 flow.   RECOMMENDATION: The patient will need dual antiplatelet therapy  for a minimum of a year.  Medical therapy will be implemented including ACE inhibition, beta blocker therapy, and high potency statin therapy.  Smoking cessation is imperative.      Echo 05/26/2016 LV EF: 20% -   25%   ------------------------------------------------------------------- Indications:      MI - acute 410.91.   ------------------------------------------------------------------- History:   PMH:   Coronary artery disease.  PMH:   Myocardial infarction.  Risk factors:  Current tobacco use.   ------------------------------------------------------------------- Study Conclusions   - Left ventricle: Systolic function was severely reduced. The   estimated ejection fraction was in the range of 20% to 25%.   Akinesis of the mid-apicalanteroseptal, inferior, and apical   myocardium. Doppler parameters are consistent with abnormal left   ventricular relaxation (grade 1 diastolic dysfunction). - Pericardium, extracardiac: A trivial pericardial effusion was   identified.   Impressions:   - This echo is extremely difficult.   The valve are not seen well enough to assess.   Definity contrast reveals anterior apical and inferior apical   akinesis.   The echo quality is not sufficient to assess other wall motion    ASSESSMENT:    1. Coronary artery disease involving native coronary artery of native heart without angina pectoris   2. Cardiomyopathy, ischemic   3. Chronic systolic heart failure ([place])   4. Hyperlipidemia, unspecified hyperlipidemia type   5. PAD (peripheral artery disease) ([place])   6. Tobacco abuse   7. Alcohol abuse      PLAN:  In order of problems listed above:  1. CAD: s/p anterior STEMI treated with PCI to prox LAD, normal LCx and RCA. No exertional-type symptom, currently on aspirin and Effient. We have given the patient  discount card for Effient. He has been advised to contact cardiology if he is ever at risk or any other Effient and unable to afford more. In that setting, I would rather the patient to be on the generic Plavix if financial issue becomes a concern, otherwise he would benefit more from Effient.   2. Chronic systolic HF/ICM with baseline EF 20-25% by Echo and 35-40% by LV gram after anterior STEMI  - Echo was severely limited by poor caustic windows therefore, it is unclear what his true LV function is.  - He has no sign of heart failure on exam. Will defer to Dr. Oval Linsey whether or not to repeat echocardiogram in 2 month.  - Continue on carvedilol and lisinopril. Given additional lisinopril, obtain basic metabolic panel today.  3. Hypertriglyceridemia: Triglyceride 177, total cholesterol 152, HDL 44, LDL 75 on 05/26/2016. Continue on current statin, recheck fasting lipid panel and LFTs in 2 months.  4. PAD s/p R BKA, followed by Dr. Oneida Alar. Per vascular surgery note, plan for repeat ABI in Dec 2017  5. Tobacco abuse: Patient has already decreased quit smoking, I congratulated him on this.  6. EtOH abuse: He drinks a lot of alcohol, I have recommended him to start weaning off   Although this appointment was originally scheduled as a transition of care appointment, however patient has canceled the previous appointment 2 weeks ago, today is to reschedule the appointment, now it is one month out from his discharge, therefore I did not bill for transitional care follow-up.   Medication Adjustments/Labs and Tests Ordered: Current medicines are reviewed at length with the patient today.  Concerns regarding medicines are outlined above.  Medication changes, Labs and Tests ordered today are listed in the Patient Instructions below. Patient Instructions  Labwork:  Your physician recommends that you return for lab work today--BMP; and in 2 months for Hepatic function panel and fasting lipid (nothing to  eat or drink past midnight the night before)   Other Instructions:  Stop drinking---gradually start cutting back on the number of alcoholic beverages you have in a day.   Follow-Up:  Your physician recommends that you schedule a follow-up appointment in: 1-2 months with Dr. Oval Linsey.  If you need a refill on your cardiac medications before your next appointment, please call your pharmacy.      Hilbert Corrigan, [place]  06/28/2016 2:29 PM    [facility] [address], [place], [place]  [address] Phone: [phone]; Fax: [phone]

## 2016-06-29 ENCOUNTER — Other Ambulatory Visit: Payer: Self-pay | Admitting: Physician Assistant

## 2016-06-29 DIAGNOSIS — E875 Hyperkalemia: Secondary | ICD-10-CM

## 2016-06-29 NOTE — Telephone Encounter (Signed)
If he can get ticagrelor through an assistance program that would be idea.  If not, clopidogrel would be OK.

## 2016-06-29 NOTE — Telephone Encounter (Signed)
Spoke to patient's wife. She states patient is unable to afford Effient. It cost $45 a month. Wife states she scraped up enough for this month. She states that the effient saving card is no longer in use.(program has been discontinued).patient would like something less expensive. RN reviewed with pharmacist - sample are not available( company stopped)  Wife aware will defer to extender and/or Dr Oval Linsey

## 2016-06-29 NOTE — Telephone Encounter (Signed)
Pt went to get Effient pt could no longer use the discount cards.Pt would like to change to something else that is less expensive please.

## 2016-06-30 NOTE — Telephone Encounter (Signed)
-----   Message from [place], [place] sent at 06/30/2016  4:10 PM EDT ----- Recheck BMET preferably this Friday or next Monday, if potassium is going up, we need to stop lisinopril

## 2016-06-30 NOTE — Telephone Encounter (Signed)
Pt aware of lab results.  He will come 07/05/16 to repeat bmet. Pt agreeable with this plan and verbalized understanding.

## 2016-06-30 NOTE — Telephone Encounter (Signed)
I believe he had shortness of breath on Brilinta and that's why it was switched to Effient prior to discharge. in this case, do you go directly to plavix or is another trial with brilinta a possibility?

## 2016-07-01 NOTE — Telephone Encounter (Signed)
Lm to discuss option of PA for Effient.

## 2016-07-05 ENCOUNTER — Other Ambulatory Visit: Payer: Medicare Other

## 2016-07-08 NOTE — Telephone Encounter (Signed)
Spoke to pt wife. They are interested in filling out paper work for [employer] patient assistance. Will fill out our portion then mail to patient address as per her request. She states they did pay for the medication a few days ago because he was low.   Informed her to get copy of drug expense and income to mail with form to drug company. She states she understands and appreciates help.

## 2016-07-09 ENCOUNTER — Other Ambulatory Visit: Payer: Self-pay

## 2016-07-09 ENCOUNTER — Telehealth: Payer: Self-pay | Admitting: Cardiovascular Disease

## 2016-07-09 DIAGNOSIS — E875 Hyperkalemia: Secondary | ICD-10-CM

## 2016-07-09 DIAGNOSIS — Z79899 Other long term (current) drug therapy: Secondary | ICD-10-CM | POA: Diagnosis not present

## 2016-07-09 NOTE — Telephone Encounter (Signed)
BMP order for [facility] per last progress note and notes on lab review routed to [place]

## 2016-07-09 NOTE — Telephone Encounter (Signed)
[facility]  In [employer] pt is in office needs ORDERS faxed please   ASAP   FAX [phone]

## 2016-07-10 LAB — BASIC METABOLIC PANEL
BUN/Creatinine Ratio: 12 (ref 9–20)
BUN: 12 mg/dL (ref 6–24)
CHLORIDE: 95 mmol/L — AB (ref 96–106)
CO2: 26 mmol/L (ref 18–29)
Calcium: 9.2 mg/dL (ref 8.7–10.2)
Creatinine, Ser: 0.97 mg/dL (ref 0.76–1.27)
GFR calc Af Amer: 103 mL/min/{1.73_m2} (ref >59)
GFR, EST NON AFRICAN AMERICAN: 89 mL/min/{1.73_m2} (ref >59)
GLUCOSE: 95 mg/dL (ref 65–99)
POTASSIUM: 4.8 mmol/L (ref 3.5–5.2)
SODIUM: 136 mmol/L (ref 134–144)

## 2016-07-12 ENCOUNTER — Encounter: Payer: Self-pay | Admitting: Vascular Surgery

## 2016-07-12 ENCOUNTER — Telehealth: Payer: Self-pay | Admitting: Physician Assistant

## 2016-07-12 NOTE — Telephone Encounter (Signed)
-----   Message from [place], [place] sent at 07/12/2016  9:50 AM EDT ----- Kidney function and potassium level stabilized, continue current medications

## 2016-07-12 NOTE — Telephone Encounter (Signed)
Spoke to pt wife--OK per DPR. Informed of results. Pt wife verbalized understanding and confirmed appointment scheduled with Dr. Oval Linsey on 12/11 at 2:15.

## 2016-07-15 ENCOUNTER — Ambulatory Visit (HOSPITAL_COMMUNITY)
Admission: RE | Admit: 2016-07-15 | Discharge: 2016-07-15 | Disposition: A | Discharge status: Home / Self Care | Payer: Medicare Other | Source: Ambulatory Visit | Attending: Vascular Surgery | Admitting: Vascular Surgery

## 2016-07-15 ENCOUNTER — Ambulatory Visit (INDEPENDENT_AMBULATORY_CARE_PROVIDER_SITE_OTHER): Payer: Medicare Other | Admitting: Vascular Surgery

## 2016-07-15 ENCOUNTER — Encounter: Payer: Self-pay | Admitting: Vascular Surgery

## 2016-07-15 VITALS — BP 111/78 | HR 68 | Temp 97.7°F | Resp 20 | Ht 73.0 in | Wt 240.2 lb

## 2016-07-15 DIAGNOSIS — I739 Peripheral vascular disease, unspecified: Secondary | ICD-10-CM

## 2016-07-15 NOTE — Progress Notes (Signed)
Patient is a 55 year old male who returns for follow-up today. He has previously undergone multiple revascularizations in the right leg and ultimately ended up with a right below-knee amputation. Recently he had a nonhealing wound on his left foot. We had him on the operating room schedule to do a left femoral to tibial artery bypass. However when he showed up in the preoperative holding area his left foot had healed completely. He denies any rest pain. He denies any wounds on his foot currently. Unfortunately he continues to smoke but is trying to cut back on this.  He had a myocardial infarction and required an additional coronary stent in late August. He currently is on Effient and aspirin.   Review of systems: He has shortness of breath with exertion. He denies chest pain.   Physical exam    Vitals:   07/15/16 1339  BP: 111/78  Pulse: 68  Resp: 20  Temp: 97.7 F (36.5 C)  TempSrc: Oral  SpO2: 97%  Weight: 240 lb 3.2 oz (109 kg)  Height: '6\' 1"'$  (1.854 m)   Extremities: Left foot no open wounds no skin cracks no ulcerations foot is warm and pink  Patient had left ABIs performed today. ABI on the left was 0.51 unchanged from one year ago.   Assessment: Severe peripheral arterial disease left lower extremity but currently all wounds of healed and he has no rest pain.   Plan: The patient will follow-up with repeat ABIs in 6 months time. He'll follow-up sooner if he develops any wound.   Ruta Hinds, MD [facility] Office: [phone] Pager: [phone]

## 2016-08-10 NOTE — Addendum Note (Signed)
Addended by: Thresa Ross C on: 08/10/2016 01:15 PM   Modules accepted: Orders

## 2016-09-06 ENCOUNTER — Ambulatory Visit (INDEPENDENT_AMBULATORY_CARE_PROVIDER_SITE_OTHER): Payer: Medicare Other | Admitting: Cardiovascular Disease

## 2016-09-06 ENCOUNTER — Encounter: Payer: Self-pay | Admitting: Cardiovascular Disease

## 2016-09-06 VITALS — BP 144/86 | HR 96 | Ht 72.0 in | Wt 246.8 lb

## 2016-09-06 DIAGNOSIS — Z955 Presence of coronary angioplasty implant and graft: Secondary | ICD-10-CM | POA: Diagnosis not present

## 2016-09-06 DIAGNOSIS — I739 Peripheral vascular disease, unspecified: Secondary | ICD-10-CM | POA: Diagnosis not present

## 2016-09-06 DIAGNOSIS — J441 Chronic obstructive pulmonary disease with (acute) exacerbation: Secondary | ICD-10-CM

## 2016-09-06 DIAGNOSIS — Z720 Tobacco use: Secondary | ICD-10-CM

## 2016-09-06 DIAGNOSIS — F17210 Nicotine dependence, cigarettes, uncomplicated: Secondary | ICD-10-CM

## 2016-09-06 DIAGNOSIS — I5022 Chronic systolic (congestive) heart failure: Secondary | ICD-10-CM | POA: Diagnosis not present

## 2016-09-06 MED ORDER — PREDNISONE 20 MG PO TABS: ORAL_TABLET | ORAL | 0 refills | Status: DC

## 2016-09-06 MED ORDER — CARVEDILOL 6.25 MG PO TABS: 6.2500 mg | ORAL_TABLET | Freq: Two times a day (BID) | ORAL | 5 refills | Status: DC

## 2016-09-06 MED ORDER — AZITHROMYCIN 250 MG PO TABS: ORAL_TABLET | ORAL | 0 refills | Status: DC

## 2016-09-06 NOTE — Patient Instructions (Signed)
Medication Instructions:  START ZITHROMAX 2 TABLETS ON DAY ONE AND THEN ONE DAILY UNTIL FINISHED  START PREDNISONE 20 MG 2 TABLETS DAILY FOR 5 DAYS  INCREASE YOUR CARVEDILOL TO 6.25 MG TWICE A DAY TAKE 2 OF THE 3.125 MG TABLETS TWICE A DAY UNTIL YOU USE UP YOUR CURRENT BOTTLE  Labwork: NONE  Testing/Procedures: Your physician has requested that you have an echocardiogram. Echocardiography is a painless test that uses sound waves to create images of your heart. It provides your doctor with information about the size and shape of your heart and how well your heart's chambers and valves are working. This procedure takes approximately one hour. There are no restrictions for this procedure. [address]  Follow-Up: Your physician recommends that you schedule a follow-up appointment in: [place]  If you need a refill on your cardiac medications before your next appointment, please call your pharmacy.

## 2016-09-06 NOTE — Progress Notes (Signed)
Cardiology Office Note   Date:  09/06/2016   ID:  [name], DOB 05/25/1961, MRN [id number]  PCP:  Glo Herring., MD  Cardiologist:   [name], MD  Vascular Surgeon: Ruta Hinds, M.D.  Chief Complaint  Patient presents with  . Follow-up    2 months;   . Shortness of Breath    when walking, or sitting down.   . Leg Pain    cramping in left leg.      History of Present Illness: [name] is a 55 y.o. 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   Mr. [name] was initially seen 01/2016 for surgical clearance for CLI.   On 3/30 he presented to Dr. Oneida Alar office and was referred for an abdominal aortogram.  This was performed on 12/30/15 and demonstrated a patent aortoiliac system on the left with a patent left common iliac stent. He had occlusion of the right internal iliac artery, the left superficial femoral and popliteal arteries.  He had 2 vessel runoff of the L foot posterior tibial and anterior tibial with diminutive peroneal.  He didn't have any exertional symptoms and was cleared for surgery.  He presented for bypass grafting from the femoral to posterior tibial artery on the left but his lower extremity wound healed, so the surgery was cancelled.  This procedure was performed due to gangrene of the left foot.    On 05/26/16 [name] presented with an anterior STEMI.  He had 100% LAD occlusion treated with a Xience DES.  LVEF was 40% by left ventriculography.  Troponin was >65.  Echo revealed LVEF 20-25% with focal wall motion abnormalities.  However, visualization was poor.  Since being discharged he has been doing well.  He reports a couple episodes of sharp, non-exertional chest pain.  He hasn't been exercising.  He continues to smoke 1/2 pack of cigarettes daily.  He  was started on low dose carvedilol, lisinopril and atorvastatin and hasn't noted any symptoms.  He denies lower extremity edema, orthopnea or PND.  He reports two falls that were mechanical.  He also reports occasional fluttering every few days.  He also reports short episodes of heart fluttering every few days.  There is no associated shortness of breath or lightheadedness.  He has shortness of breath chronically that is unchanged.  He reports three days of cough, chills and sputum production.   Past Medical History:  Diagnosis Date  . Anxiety   . Asthma   . Depression   . DVT (deep venous thrombosis) ([place]) 2009  . Hepatitis C    no prior treatment  -   did treatment 2015  . STEMI (ST elevation myocardial infarction) ([place]) 05/26/2016  . Tobacco abuse 01/28/2016    Past Surgical History:  Procedure Laterality Date  . AORTOGRAM  08/08/2009   for LLE claudication     By Dr. Oneida Alar  . 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 BIOPSY N/A 05/30/2014   Procedure: BIOPSY;  Surgeon: [name], MD;  Location: [facility];  Service: Endoscopy;  Laterality: N/A;  . BLADDER TUMOR EXCISION  8/812  . CARDIAC CATHETERIZATION N/A 05/26/2016   Procedure: Left Heart Cath and Coronary Angiography;  Surgeon: [name], MD;  Location: [place] [facility];  Service:  Cardiovascular;  Laterality: N/A;  . CARDIAC CATHETERIZATION N/A 05/26/2016   Procedure: Coronary Stent Intervention;  Surgeon: [name], MD;  Location: [place] [facility];  Service: Cardiovascular;  Laterality: N/A;  . CHOLECYSTECTOMY    . COLONOSCOPY WITH PROPOFOL N/A 05/30/2014   EXB:MWUXLK colonic polyp-likely source of hematochezia-removed as described above  . ESOPHAGOGASTRODUODENOSCOPY (EGD) WITH PROPOFOL N/A 05/30/2014   GMW:NUUVOZ and bulbar erosions s/p gastric biopsy. No evidence of portal gastropathy on today's examination.  . FEMORAL-TIBIAL BYPASS GRAFT  2009   Right side using non-reversed GSV   By Dr. Oneida Alar  .  FEMORAL-TIBIAL BYPASS GRAFT  11/24/2007   Right femoral to anterior tibial BPG   by Dr. Oneida Alar  . HAND TENDON SURGERY Left 2013   Left 5th finger  . HERNIA REPAIR    . INCISIONAL HERNIA REPAIR N/A 12/25/2014   Procedure: HERNIA REPAIR INCISIONAL WITH MESH;  Surgeon: [name], MD;  Location: [facility];  Service: General;  Laterality: N/A;  . INSERTION OF MESH N/A 12/25/2014   Procedure: INSERTION OF MESH;  Surgeon: [name], MD;  Location: [facility];  Service: General;  Laterality: N/A;  . LOWER EXTREMITY ANGIOGRAM Left 12/30/2015   Procedure: Lower Extremity Angiogram;  Surgeon: [name], MD;  Location: [place] [facility];  Service: Cardiovascular;  Laterality: Left;  . PERIPHERAL VASCULAR CATHETERIZATION N/A 12/30/2015   Procedure: Abdominal Aortogram;  Surgeon: [name], MD;  Location: [place] [facility];  Service: Cardiovascular;  Laterality: N/A;  . POLYPECTOMY  05/30/2014   Procedure: POLYPECTOMY;  Surgeon: [name], MD;  Location: [facility];  Service: Endoscopy;;  . TONSILLECTOMY       Current Outpatient Prescriptions  Medication Sig Dispense Refill  . alprazolam (XANAX) 2 MG tablet Take 2 mg by mouth 4 (four) times daily as needed for anxiety.     [name] amitriptyline (ELAVIL) 50 MG tablet Take 50 mg by mouth at bedtime.    [name] aspirin 81 MG EC tablet Take 81 mg by mouth daily.      [name] atorvastatin (LIPITOR) 80 MG tablet Take 1 tablet (80 mg total) by mouth daily at 6 PM. 30 tablet 12  . carvedilol (COREG) 6.25 MG tablet Take 1 tablet (6.25 mg total) by mouth 2 (two) times daily with a meal. 60 tablet 5  . citalopram (CELEXA) 40 MG tablet Take 40 mg by mouth daily.  11  . HYDROcodone-acetaminophen (NORCO) 10-325 MG tablet Take 1 tablet by mouth every 4 (four) hours as needed.  0  . lisinopril (PRINIVIL,ZESTRIL) 2.5 MG tablet Take 1 tablet (2.5 mg total) by mouth daily. 30 tablet 12  . nicotine (NICODERM CQ - DOSED IN MG/24 HOURS) 14 mg/24hr patch Place 1 patch (14 mg  total) onto the skin daily. 28 patch 0  . nitroGLYCERIN (NITROSTAT) 0.4 MG SL tablet Place 1 tablet (0.4 mg total) under the tongue every 5 (five) minutes x 3 doses as needed for chest pain. 25 tablet 2  . prasugrel (EFFIENT) 10 MG TABS tablet Take 1 tablet (10 mg total) by mouth daily. 30 tablet 12  . azithromycin (ZITHROMAX) 250 MG tablet 2 tablets by mouth on day one and then one daily until finished 6 each 0  . predniSONE (DELTASONE) 20 MG tablet 2 tablets by mouth daily for 5 days 10 tablet 0   No current facility-administered medications for this visit.     Allergies:   Lyrica [pregabalin] and Neurontin [gabapentin]  Social History:  The patient  reports that he quit smoking about 3 months ago. His smoking use included Cigarettes. He smoked 0.00 packs per day. He has never used smokeless tobacco. He reports that he uses drugs, including Marijuana. He reports that he does not drink alcohol.   Family History:  The patient's family history includes Ulcers in his father; Vision loss in his mother.    ROS:  Please see the history of present illness.   Otherwise, review of systems are positive for none.   All other systems are reviewed and negative.    PHYSICAL EXAM: VS:  BP (!) 144/86   Pulse 96   Ht 6' (1.829 m)   Wt 111.9 kg (246 lb 12.8 oz)   BMI 33.47 kg/m  , BMI Body mass index is 33.47 kg/m. GENERAL:  Well appearing HEENT:  Pupils equal round and reactive, fundi not visualized, oral mucosa unremarkable NECK:  No jugular venous distention, waveform within normal limits, carotid upstroke brisk and symmetric, no bruits LYMPHATICS:  No cervical adenopathy LUNGS:  Diffuse expiratory wheezing HEART:  RRR.  PMI not displaced or sustained,S1 and S2 within normal limits, no S3, no S4, no clicks, no rubs, no murmurs ABD:  Flat, positive bowel sounds normal in frequency in pitch, no bruits, no rebound, no guarding, no midline pulsatile mass, no hepatomegaly, no splenomegaly EXT:   1+DP.  TP not palpable.  No edema.  R BKA.  L toes cyanotic with poor capillary refill. SKIN:  No rashes no nodules NEURO:  Cranial nerves II through XII grossly intact, motor grossly intact throughout PSYCH:  Cognitively intact, oriented to person place and time   EKG:  EKG is not ordered today. The ekg ordered 4/4/17demonstrates sinus rhythm. Rate 74 bpm. Low voltage limb leads.  Echo 05/26/16: Study Conclusions  - Left ventricle: Systolic function was severely reduced. The   estimated ejection fraction was in the range of 20% to 25%.   Akinesis of the mid-apicalanteroseptal, inferior, and apical   myocardium. Doppler parameters are consistent with abnormal left   ventricular relaxation (grade 1 diastolic dysfunction). - Pericardium, extracardiac: A trivial pericardial effusion was   identified.  Impressions:  - This echo is extremely difficult.   The valve are not seen well enough to assess.   Definity contrast reveals anterior apical and inferior apical   akinesis.   The echo quality is not sufficient to assess other wall motion  LHC 05/26/16:  A STENT XIENCE ALPINE RX 3.5X18 drug eluting stent was successfully placed, and does not overlap previously placed stent.  Prox LAD lesion, 100 %stenosed.  Post intervention, there is a 0% residual stenosis.  LV end diastolic pressure is mildly elevated.  There is mild to moderate left ventricular systolic dysfunction.   Recent Labs: 05/26/2016: ALT 48; Hemoglobin 17.0; Platelets 338 07/09/2016: BUN 12; Creatinine, Ser 0.97; Potassium 4.8; Sodium 136   Lipid Panel    Component Value Date/Time   CHOL 154 05/26/2016 0459   TRIG 177 (H) 05/26/2016 0459   HDL 44 05/26/2016 0459   CHOLHDL 3.5 05/26/2016 0459   VLDL 35 05/26/2016 0459   LDLCALC 75 05/26/2016 0459      Wt Readings from Last 3 Encounters:  09/06/16 111.9 kg (246 lb 12.8 oz)  07/15/16 109 kg (240 lb 3.2 oz)  06/28/16 109 kg (240 lb 3.2 oz)       ASSESSMENT AND PLAN:  # CAD s/p STEMI: Mr. Kneece had a large MI 05/26/16.  He had a DES placed in the LAD.  He has been feeling well and denies exertional chest pain.  Continue aspirin and prasugrel for at least a year.  His BP will tolerate titrating his carvedilol so we will increase it to 6.'25mg'$  q12h.  Continue atorvastatin.   # Chronic systolic and diastolic heart failure:  Mr. Lange is euvolemic on exam.  We will increase carvedilol as above.  Continue lisinopril.  We will repeat his echo.  If LVEF remains <35% he will need an ICD.  # COPD Exacerbation:  Z pack and Prednisone '40mg'$  daily x5 days.  # PAD: Continue aspirin and statin.  Encouraged smoking cessation.  Continue follow up with vascular surgery.  # Tobacco abuse: 5 minutes of smoking cessation counseling.  He wants to try the nicotine replacement gum.     Current medicines are reviewed at length with the patient today.  The patient does not have concerns regarding medicines.  The following changes have been made:  Increase carvedilol to 6.'25mg'$  bid. Labs/ tests ordered today include:   Orders Placed This Encounter  Procedures  . ECHOCARDIOGRAM COMPLETE     Disposition:   FU with Deseray Daponte C. Oval Linsey, MD, [facility] in 3 months.     This note was written with the assistance of speech recognition software.  Please excuse any transcriptional errors.  Signed, Iasha Mccalister C. Oval Linsey, MD, [facility]  09/06/2016 11:52 PM    [place]

## 2016-09-07 ENCOUNTER — Telehealth: Payer: Self-pay | Admitting: Internal Medicine

## 2016-09-07 NOTE — Telephone Encounter (Signed)
RECALL FOR [place] WITH ELASTO

## 2016-09-07 NOTE — Telephone Encounter (Signed)
Letter mailed

## 2016-09-08 DIAGNOSIS — G894 Chronic pain syndrome: Secondary | ICD-10-CM | POA: Diagnosis not present

## 2016-09-08 DIAGNOSIS — Z1389 Encounter for screening for other disorder: Secondary | ICD-10-CM | POA: Diagnosis not present

## 2016-09-08 DIAGNOSIS — G64 Other disorders of peripheral nervous system: Secondary | ICD-10-CM | POA: Diagnosis not present

## 2016-09-29 ENCOUNTER — Other Ambulatory Visit (HOSPITAL_COMMUNITY): Payer: Medicare Other

## 2016-10-29 ENCOUNTER — Telehealth: Payer: Self-pay | Admitting: Cardiovascular Disease

## 2016-10-29 MED ORDER — CLOPIDOGREL BISULFATE 75 MG PO TABS: 75.0000 mg | ORAL_TABLET | Freq: Every day | ORAL | 11 refills | Status: DC

## 2016-10-29 NOTE — Telephone Encounter (Signed)
Spoke with wife. medication with insurance cost $ 70- unable to afford  savings card unavailable.  RN reviewed with Dr Oval Linsey may discontinue and switch to clopidogrel 75 mg. First dose 300 mg ( 4 tablets) e-sent to pharmacy   Wife notified--patient is to  complete taking  The effient then start clopidogrel  Aware to contact office if unable to fill

## 2016-10-29 NOTE — Telephone Encounter (Signed)
New Message    Pt states they can not afford the Effient  And he only has 6 days left, is there something you can help with?

## 2016-11-02 ENCOUNTER — Telehealth: Payer: Self-pay | Admitting: *Deleted

## 2016-11-02 NOTE — Telephone Encounter (Signed)
Patient cancelled Echo and follow up appointment with Dr Oval Linsey per Epic appointments

## 2016-11-10 ENCOUNTER — Telehealth (HOSPITAL_COMMUNITY): Payer: Self-pay | Admitting: Internal Medicine

## 2016-11-10 ENCOUNTER — Encounter (HOSPITAL_COMMUNITY): Payer: Self-pay | Admitting: Internal Medicine

## 2016-11-10 NOTE — Telephone Encounter (Signed)
Mailed letter with [facility] to pt ... KJ  °

## 2016-11-10 NOTE — Progress Notes (Signed)
Mailed letter with [facility] to pt ... KJ  °

## 2016-12-10 ENCOUNTER — Ambulatory Visit: Payer: Medicare Other | Admitting: Cardiovascular Disease

## 2017-01-19 ENCOUNTER — Encounter: Payer: Self-pay | Admitting: Vascular Surgery

## 2017-01-19 DIAGNOSIS — Z7689 Persons encountering health services in other specified circumstances: Secondary | ICD-10-CM | POA: Diagnosis not present

## 2017-01-19 DIAGNOSIS — G894 Chronic pain syndrome: Secondary | ICD-10-CM | POA: Diagnosis not present

## 2017-01-19 DIAGNOSIS — Z1389 Encounter for screening for other disorder: Secondary | ICD-10-CM | POA: Diagnosis not present

## 2017-01-20 ENCOUNTER — Ambulatory Visit (HOSPITAL_COMMUNITY): Payer: Medicare Other | Attending: Cardiovascular Disease

## 2017-01-20 ENCOUNTER — Ambulatory Visit: Payer: Medicare Other | Admitting: Vascular Surgery

## 2017-01-20 ENCOUNTER — Encounter (HOSPITAL_COMMUNITY): Payer: Medicare Other

## 2017-01-20 ENCOUNTER — Other Ambulatory Visit: Payer: Self-pay

## 2017-01-20 DIAGNOSIS — I5022 Chronic systolic (congestive) heart failure: Secondary | ICD-10-CM | POA: Diagnosis not present

## 2017-01-20 MED ORDER — PERFLUTREN LIPID MICROSPHERE
1.0000 mL | INTRAVENOUS | Status: AC | PRN
  Administered 2017-01-20: 2 mL via INTRAVENOUS

## 2017-01-27 ENCOUNTER — Ambulatory Visit (INDEPENDENT_AMBULATORY_CARE_PROVIDER_SITE_OTHER): Payer: Medicare Other | Admitting: Vascular Surgery

## 2017-01-27 ENCOUNTER — Ambulatory Visit (HOSPITAL_COMMUNITY)
Admission: RE | Admit: 2017-01-27 | Discharge: 2017-01-27 | Disposition: A | Discharge status: Home / Self Care | Payer: Medicare Other | Source: Ambulatory Visit | Attending: Vascular Surgery | Admitting: Vascular Surgery

## 2017-01-27 ENCOUNTER — Encounter: Payer: Self-pay | Admitting: Vascular Surgery

## 2017-01-27 VITALS — BP 113/79 | Temp 98.1°F | Resp 20 | Ht 72.0 in | Wt 246.6 lb

## 2017-01-27 DIAGNOSIS — Z720 Tobacco use: Secondary | ICD-10-CM | POA: Diagnosis not present

## 2017-01-27 DIAGNOSIS — I739 Peripheral vascular disease, unspecified: Secondary | ICD-10-CM | POA: Diagnosis not present

## 2017-01-27 DIAGNOSIS — R0989 Other specified symptoms and signs involving the circulatory and respiratory systems: Secondary | ICD-10-CM | POA: Diagnosis not present

## 2017-01-27 DIAGNOSIS — R938 Abnormal findings on diagnostic imaging of other specified body structures: Secondary | ICD-10-CM | POA: Diagnosis not present

## 2017-01-27 NOTE — Progress Notes (Signed)
[facility]ist [facility]  Patient name: [name] MRN: [id number] DOB: Jan 26, 1961 Sex: male  REASON FOR VISIT: follow-up  HPI: [name] is a 56 y.o. male who presents for follow-up of his peripheral artery disease. The patient previously had multiple revascularization attempts to the right leg that ultimately resulted in right below-knee amputation. He walks with a prosthesis. He was previously scheduled for a left femoral to tibial bypass for a nonhealing wound on his left foot. His surgery was canceled in the preoperative area when it was noticed that his wound had healed.  The patient denies any nonhealing wounds. He does note left calf claudication after walking laps at an indoor track. This gets better with rest. He continues to smoke. He says that he wants to quit smoking.   He is on aspirin and a statin. He denies any chest pain or shortness of breath.   Past Medical History:  Diagnosis Date  . Anxiety   . Asthma   . Depression   . DVT (deep venous thrombosis) ([place]) 2009  . Hepatitis C    no prior treatment  -   did treatment 2015  . STEMI (ST elevation myocardial infarction) ([place]) 05/26/2016  . Tobacco abuse 01/28/2016    Family History  Problem Relation Age of Onset  . Vision loss Mother   . Ulcers Father   . Colon cancer Neg Hx     SOCIAL HISTORY: Social History  Substance Use Topics  . Smoking status: Former Smoker    Packs/day: 0.00    Types: Cigarettes    Quit date: 05/27/2016  . Smokeless tobacco: Never Used     Comment: cigarettes  . Alcohol use No     Comment: Patient quit drinking about a year ago; history of heavy ETOH use...has a beer now and then    Allergies  Allergen Reactions  . Lyrica [Pregabalin] Palpitations    Chest pain and heart palps.  . Neurontin [Gabapentin] Palpitations    Chest pain and heart palp    Current Outpatient Prescriptions  Medication Sig Dispense Refill  . alprazolam (XANAX) 2 MG tablet Take 2 mg  by mouth 4 (four) times daily as needed for anxiety.     [name] amitriptyline (ELAVIL) 50 MG tablet Take 100 mg by mouth at bedtime.     [name] aspirin 81 MG EC tablet Take 81 mg by mouth daily.      [name] atorvastatin (LIPITOR) 80 MG tablet Take 1 tablet (80 mg total) by mouth daily at 6 PM. 30 tablet 12  . azithromycin (ZITHROMAX) 250 MG tablet 2 tablets by mouth on day one and then one daily until finished 6 each 0  . carvedilol (COREG) 6.25 MG tablet Take 1 tablet (6.25 mg total) by mouth 2 (two) times daily with a meal. 60 tablet 5  . citalopram (CELEXA) 40 MG tablet Take 40 mg by mouth daily.  11  . clopidogrel (PLAVIX) 75 MG tablet Take 1 tablet (75 mg total) by mouth daily. (First dose take 4 tablet) 30 tablet 11  . HYDROcodone-acetaminophen (NORCO) 10-325 MG tablet Take 1 tablet by mouth every 4 (four) hours as needed.  0  . lisinopril (PRINIVIL,ZESTRIL) 2.5 MG tablet Take 1 tablet (2.5 mg total) by mouth daily. 30 tablet 12  . nicotine (NICODERM CQ - DOSED IN MG/24 HOURS) 14 mg/24hr patch Place 1 patch (14 mg total) onto the skin daily. 28 patch 0  . nitroGLYCERIN (NITROSTAT) 0.4 MG SL  tablet Place 1 tablet (0.4 mg total) under the tongue every 5 (five) minutes x 3 doses as needed for chest pain. 25 tablet 2  . predniSONE (DELTASONE) 20 MG tablet 2 tablets by mouth daily for 5 days 10 tablet 0   No current facility-administered medications for this visit.     REVIEW OF SYSTEMS:  '[X]'$  denotes positive finding, '[ ]'$  denotes negative finding Cardiac  Comments:  Chest pain or chest pressure:    Shortness of breath upon exertion:    Short of breath when lying flat:    Irregular heart rhythm:        Vascular    Pain in calf, thigh, or hip brought on by ambulation: x Left calf   Pain in feet at night that wakes you up from your sleep:     Blood clot in your veins:    Leg swelling:         Pulmonary    Oxygen at home:    Productive cough:     Wheezing:         Neurologic    Sudden weakness in  arms or legs:     Sudden numbness in arms or legs:     Sudden onset of difficulty speaking or slurred speech:    Temporary loss of vision in one eye:     Problems with dizziness:         Gastrointestinal    Blood in stool:     Vomited blood:         Genitourinary    Burning when urinating:     Blood in urine:        Psychiatric    Major depression:         Hematologic    Bleeding problems:    Problems with blood clotting too easily:        Skin    Rashes or ulcers:        Constitutional    Fever or chills:      PHYSICAL EXAM: Vitals:   01/27/17 1349  BP: 113/79  Resp: 20  Temp: 98.1 F (36.7 C)  TempSrc: Oral  SpO2: 96%  Weight: 246 lb 9.6 oz (111.9 kg)  Height: 6' (1.829 m)    GENERAL: The patient is a well-nourished male, in no acute distress. The vital signs are documented above. HEENT: normocephalic, atraumatic. No abnormalities noted.  CARDIAC: There is a regular rate and rhythm. No carotid bruits. VASCULAR: Non palpable femoral pulses. Nonpalpable left pedal pulses. No wounds noted to left foot.  PULMONARY: There is good air exchange bilaterally. No wheezes or rales.  ABDOMEN: Soft and non-tender.  MUSCULOSKELETAL:Right below-knee amputation.  NEUROLOGIC: No focal weakness or paresthesias are detected. SKIN: There are no ulcers or rashes noted. PSYCHIATRIC: The patient has a normal affect.  DATA:  ABIs 01/27/2017  Left: 0.63 (previously 0.51) with monophasic waveforms. TBI: 0.65  MEDICAL ISSUES: Peripheral arterial disease left lower extremity  ABIs have improved slightly from last visit. He is ambulating 2 laps had an indoor track before having calf claudication. No wounds noted. Encouraged him to continue walking regularly. Counseled on smoking cessation. Follow-up in 1 year with repeat ABIs. He knows to return sooner if he develops any nonhealing wounds.   Virgina Jock, PA-C [facility] MD: Oneida Alar.    Agree with above. Patient has gained 10-20 pounds over the last year. I discussed with him trying to drops of this with  him. He has also continued to smoke. He was counseled against this. His ABIs of been relatively stable. He will follow-up with [place] in one year.  Ruta Hinds, MD [facility] [place] Office: [phone] Pager: [phone]

## 2017-02-01 ENCOUNTER — Ambulatory Visit (INDEPENDENT_AMBULATORY_CARE_PROVIDER_SITE_OTHER): Payer: Medicare Other | Admitting: Cardiovascular Disease

## 2017-02-01 VITALS — BP 98/58 | HR 66 | Ht 72.0 in | Wt 244.0 lb

## 2017-02-01 DIAGNOSIS — F17210 Nicotine dependence, cigarettes, uncomplicated: Secondary | ICD-10-CM | POA: Diagnosis not present

## 2017-02-01 DIAGNOSIS — R931 Abnormal findings on diagnostic imaging of heart and coronary circulation: Secondary | ICD-10-CM

## 2017-02-01 DIAGNOSIS — I2510 Atherosclerotic heart disease of native coronary artery without angina pectoris: Secondary | ICD-10-CM

## 2017-02-01 DIAGNOSIS — F1010 Alcohol abuse, uncomplicated: Secondary | ICD-10-CM

## 2017-02-01 DIAGNOSIS — I5022 Chronic systolic (congestive) heart failure: Secondary | ICD-10-CM | POA: Diagnosis not present

## 2017-02-01 DIAGNOSIS — R9430 Abnormal result of cardiovascular function study, unspecified: Secondary | ICD-10-CM

## 2017-02-01 DIAGNOSIS — E785 Hyperlipidemia, unspecified: Secondary | ICD-10-CM

## 2017-02-01 DIAGNOSIS — I739 Peripheral vascular disease, unspecified: Secondary | ICD-10-CM

## 2017-02-01 MED ORDER — VARENICLINE TARTRATE 0.5 MG X 11 & 1 MG X 42 PO MISC: ORAL | 0 refills | Status: DC

## 2017-02-01 MED ORDER — VARENICLINE TARTRATE 1 MG PO TABS: 1.0000 mg | ORAL_TABLET | Freq: Two times a day (BID) | ORAL | 1 refills | Status: DC

## 2017-02-01 NOTE — Patient Instructions (Addendum)
Cardioverter Defibrillator Implantation An implantable cardioverter defibrillator (ICD) is a small device that is placed under the skin in the chest or abdomen. An ICD consists of a battery, a small computer (pulse generator), and wires (leads) that go into the heart. An ICD is used to detect and correct two types of dangerous irregular heartbeats (arrhythmias):  A rapid heart rhythm (tachycardia).  An arrhythmia in which the lower chambers of the heart (ventricles) contract in an uncoordinated way (fibrillation). When an ICD detects tachycardia, it sends a low-energy shock to the heart to restore the heartbeat to normal (cardioversion). This signal is usually painless. If cardioversion does not work or if the ICD detects fibrillation, it delivers a high-energy shock to the heart (defibrillation) to restart the heart. This shock may feel like a strong jolt in the chest. Your health care provider may prescribe an ICD if:  You have had an arrhythmia that originated in the ventricles.  Your heart has been damaged by a disease or heart condition. Sometimes, ICDs are programmed to act as a device called a pacemaker. Pacemakers can be used to treat a slow heartbeat (bradycardia) or tachycardia by taking over the heart rate with electrical impulses. Tell a health care provider about:  Any allergies you have.  All medicines you are taking, including vitamins, herbs, eye drops, creams, and over-the-counter medicines.  Any problems you or family members have had with anesthetic medicines.  Any blood disorders you have.  Any surgeries you have had.  Any medical conditions you have.  Whether you are pregnant or may be pregnant. What are the risks? Generally, this is a safe procedure. However, problems may occur, including:  Swelling, bleeding, or bruising.  Infection.  Blood clots.  Damage to other structures or organs, such as nerves, blood vessels, or the heart.  Allergic reactions to  medicines used during the procedure. What happens before the procedure? Staying hydrated  Follow instructions from your health care provider about hydration, which may include:  Up to 2 hours before the procedure - you may continue to drink clear liquids, such as water, clear fruit juice, black coffee, and plain tea. Eating and drinking restrictions  Follow instructions from your health care provider about eating and drinking, which may include:  8 hours before the procedure - stop eating heavy meals or foods such as meat, fried foods, or fatty foods.  6 hours before the procedure - stop eating light meals or foods, such as toast or cereal.  6 hours before the procedure - stop drinking milk or drinks that contain milk.  2 hours before the procedure - stop drinking clear liquids. Medicine  Ask your health care provider about:  Changing or stopping your normal medicines. This is important if you take diabetes medicines or blood thinners.  Taking medicines such as aspirin and ibuprofen. These medicines can thin your blood. Do not take these medicines before your procedure if your doctor tells you not to. Tests   You may have blood tests.  You may have a test to check the electrical signals in your heart (electrocardiogram, ECG).  You may have imaging tests, such as a chest X-ray. General instructions   For 24 hours before the procedure, stop using products that contain nicotine or tobacco, such as cigarettes and e-cigarettes. If you need help quitting, ask your health care provider.  Plan to have someone take you home from the hospital or clinic.  You may be asked to shower with a germ-killing soap. What  happens during the procedure?  To reduce your risk of infection:  Your health care team will wash or sanitize their hands.  Your skin will be washed with soap.  Hair may be removed from the surgical area.  Small monitors will be put on your body. They will be used to check  your heart, blood pressure, and oxygen level.  An IV tube will be inserted into one of your veins.  You will be given one or more of the following:  A medicine to help you relax (sedative).  A medicine to numb the area (local anesthetic).  A medicine to make you fall asleep (general anesthetic).  Leads will be guided through a blood vessel into your heart and attached to your heart muscles. Depending on the ICD, the leads may go into one ventricle or they may go into both ventricles and into an upper chamber of the heart. An X-ray machine (fluoroscope) will be usedto help guide the leads.  A small incision will be made to create a deep pocket under your skin.  The pulse generator will be placed into the pocket.  The ICD will be tested.  The incision will be closed with stitches (sutures), skin glue, or staples.  A bandage (dressing) will be placed over the incision. This procedure may vary among health care providers and hospitals. What happens after the procedure?  Your blood pressure, heart rate, breathing rate, and blood oxygen level will be monitored often until the medicines you were given have worn off.  A chest X-ray will be taken to check that the ICD is in the right place.  You will need to stay in the hospital for 1-2 days so your health care provider can make sure your ICD is working.  Do not drive for 24 hours if you received a sedative. Ask your health care provider when it is safe for you to drive.  You may be given an identification card explaining that you have an ICD. Summary  An implantable cardioverter defibrillator (ICD) is a small device that is placed under the skin in the chest or abdomen. It is used to detect and correct dangerous irregular heartbeats (arrhythmias).  An ICD consists of a battery, a small computer (pulse generator), and wires (leads) that go into the heart.  When an ICD detects rapid heart rhythm (tachycardia), it sends a low-energy  shock to the heart to restore the heartbeat to normal (cardioversion). If cardioversion does not work or if the ICD detects uncoordinated heart contractions (fibrillation), it delivers a high-energy shock to the heart (defibrillation) to restart the heart.  You will need to stay in the hospital for 1-2 days to make sure your ICD is working. This information is not intended to replace advice given to you by your health care provider. Make sure you discuss any questions you have with your health care provider. Document Released: 06/05/2002 Document Revised: 09/22/2016 Document Reviewed: 09/22/2016 Elsevier Interactive Patient Education  2017 [employer].   Medication Instructions:  Your physician recommends that you continue on your current medications as directed. Please refer to the Current Medication list given to you today.  Chantix Rx called to pharmacy to help you quit smoking   Labwork: NONE  Testing/Procedures: NONE  Follow-Up: Your physician recommends that you schedule a follow-up appointment in: LATE July OR EARLY August with EP     Your physician wants you to follow-up in: [place] will receive a reminder letter in the mail two  months in advance. If you don't receive a letter, please call our office to schedule the follow-up appointment.   If you need a refill on your cardiac medications before your next appointment, please call your pharmacy.

## 2017-02-01 NOTE — Addendum Note (Signed)
Addended by: Lianne Cure A on: 02/01/2017 09:56 AM   Modules accepted: Orders

## 2017-02-01 NOTE — Progress Notes (Signed)
Cardiology Office Note   Date:  02/01/2017   ID:  Mark MORSS, DOB 26-Jan-1961, MRN [id number]  PCP:  Redmond School, MD  Cardiologist:   [name], MD  Vascular Surgeon: Ruta Hinds, M.D.  Chief Complaint  Patient presents with  . Follow-up    No chest pain, occassional shortness of breath, no edema, occassional pain or cramping in legs, no lightheaded or dizziness       History of Present Illness: [name] is a 56 y.o. 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   [name] was initially seen 01/2016 for surgical clearance for CLI.   On 11/2015 he presented to Dr. Oneida Alar office and was referred for an abdominal aortogram.  This was performed on 12/30/15 and demonstrated a patent aortoiliac system on the left with a patent left common iliac stent. He had occlusion of the right internal iliac artery, the left superficial femoral and popliteal arteries.  He had 2 vessel runoff of the L foot posterior tibial and anterior tibial with diminutive peroneal.  He didn't have any exertional symptoms and was cleared for surgery.  He presented for bypass grafting from the femoral to posterior tibial artery on the left but his lower extremity wound healed, so the surgery was cancelled.  This procedure was performed due to gangrene of the left foot.    On 05/26/16 [name] presented with an anterior STEMI.  He had 100% LAD occlusion treated with a Xience DES.  LVEF was 40% by left ventriculography.  Troponin was >65.  Echo revealed LVEF 20-25% with focal wall motion abnormalities.  He was last seen 08/2016.  Carvedilol was increased.  He had a repeat echo 01/20/17 that revealed LVEF 25-30% with akinesis of the apex, apical septum, mid-apical inferior, mid-apical anterior myocardium.  It also showed  hypokinesis of the basal inferoseptum and grade 1 diastolic dysfunction.  Mr. [name] has been feeling well.  He doesn't get much exercise at home but visits his ather in [place] often.  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  He has no exertional chest pain or shortness of breath.  He denies lower extremity edema, orhtopnea or PND.  Lately he has been dealing with stump pain. He continues to smoke 1/2 to 1 pack of cigarettes daily.  He is interested in quitting.  Patches and gum have not helped him in the past.    Past Medical History:  Diagnosis Date  . Anxiety   . Asthma   . Depression   . DVT (deep venous thrombosis) ([place]) 2009  . Hepatitis C    no prior treatment  -   did treatment 2015  . STEMI (ST elevation myocardial infarction) ([place]) 05/26/2016  . Tobacco abuse 01/28/2016    Past Surgical History:  Procedure Laterality Date  . AORTOGRAM  08/08/2009   for LLE claudication     By Dr. Oneida Alar  . 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 BIOPSY N/A 05/30/2014   Procedure: BIOPSY;  Surgeon: [name], MD;  Location: [facility];  Service: Endoscopy;  Laterality: N/A;  . BLADDER TUMOR EXCISION  8/812  . CARDIAC CATHETERIZATION N/A 05/26/2016   Procedure: Left Heart Cath and Coronary  Angiography;  Surgeon: [name], MD;  Location: [place] [facility];  Service: Cardiovascular;  Laterality: N/A;  . CARDIAC CATHETERIZATION N/A 05/26/2016   Procedure: Coronary Stent Intervention;  Surgeon: [name], MD;  Location: [place] [facility];  Service: Cardiovascular;  Laterality: N/A;  . CHOLECYSTECTOMY    . COLONOSCOPY WITH PROPOFOL N/A 05/30/2014   MGQ:QPYPPJ colonic polyp-likely source of hematochezia-removed as described above  . ESOPHAGOGASTRODUODENOSCOPY (EGD) WITH PROPOFOL N/A 05/30/2014   KDT:OIZTIW and bulbar erosions s/p gastric biopsy. No evidence of portal gastropathy on today's examination.  .  FEMORAL-TIBIAL BYPASS GRAFT  2009   Right side using non-reversed GSV   By Dr. Oneida Alar  . FEMORAL-TIBIAL BYPASS GRAFT  11/24/2007   Right femoral to anterior tibial BPG   by Dr. Oneida Alar  . HAND TENDON SURGERY Left 2013   Left 5th finger  . HERNIA REPAIR    . INCISIONAL HERNIA REPAIR N/A 12/25/2014   Procedure: HERNIA REPAIR INCISIONAL WITH MESH;  Surgeon: [name], MD;  Location: [facility];  Service: General;  Laterality: N/A;  . INSERTION OF MESH N/A 12/25/2014   Procedure: INSERTION OF MESH;  Surgeon: [name], MD;  Location: [facility];  Service: General;  Laterality: N/A;  . LOWER EXTREMITY ANGIOGRAM Left 12/30/2015   Procedure: Lower Extremity Angiogram;  Surgeon: [name], MD;  Location: [place] [facility];  Service: Cardiovascular;  Laterality: Left;  . PERIPHERAL VASCULAR CATHETERIZATION N/A 12/30/2015   Procedure: Abdominal Aortogram;  Surgeon: [name], MD;  Location: [place] [facility];  Service: Cardiovascular;  Laterality: N/A;  . POLYPECTOMY  05/30/2014   Procedure: POLYPECTOMY;  Surgeon: [name], MD;  Location: [facility];  Service: Endoscopy;;  . TONSILLECTOMY       Current Outpatient Prescriptions  Medication Sig Dispense Refill  . alprazolam (XANAX) 2 MG tablet Take 2 mg by mouth 4 (four) times daily as needed for anxiety.     [name] amitriptyline (ELAVIL) 50 MG tablet Take 100 mg by mouth at bedtime.     [name] aspirin 81 MG EC tablet Take 81 mg by mouth daily.      [name] atorvastatin (LIPITOR) 80 MG tablet Take 1 tablet (80 mg total) by mouth daily at 6 PM. 30 tablet 12  . azithromycin (ZITHROMAX) 250 MG tablet 2 tablets by mouth on day one and then one daily until finished 6 each 0  . carvedilol (COREG) 6.25 MG tablet Take 1 tablet (6.25 mg total) by mouth 2 (two) times daily with a meal. 60 tablet 5  . citalopram (CELEXA) 40 MG tablet Take 40 mg by mouth daily.  11  . clopidogrel (PLAVIX) 75 MG tablet Take 1 tablet (75 mg total) by mouth daily. (First dose take 4  tablet) 30 tablet 11  . HYDROcodone-acetaminophen (NORCO) 10-325 MG tablet Take 1 tablet by mouth every 4 (four) hours as needed.  0  . lisinopril (PRINIVIL,ZESTRIL) 2.5 MG tablet Take 1 tablet (2.5 mg total) by mouth daily. 30 tablet 12  . nicotine (NICODERM CQ - DOSED IN MG/24 HOURS) 14 mg/24hr patch Place 1 patch (14 mg total) onto the skin daily. 28 patch 0  . nitroGLYCERIN (NITROSTAT) 0.4 MG SL tablet Place 1 tablet (0.4 mg total) under the tongue every 5 (five) minutes x 3 doses as needed for chest pain. 25 tablet 2  . predniSONE (DELTASONE) 20 MG tablet 2 tablets by mouth daily for 5 days 10 tablet 0  . varenicline (  CHANTIX CONTINUING MONTH PAK) 1 MG tablet Take 1 tablet (1 mg total) by mouth 2 (two) times daily. 60 tablet 1  . varenicline (CHANTIX STARTING MONTH PAK) 0.5 MG X 11 & 1 MG X 42 tablet Take one 0.5 mg tablet by mouth once daily for 3 days, then increase to one 0.5 mg tablet twice daily for 4 days, then increase to one 1 mg tablet twice daily. 53 tablet 0   No current facility-administered medications for this visit.     Allergies:   Lyrica [pregabalin] and Neurontin [gabapentin]    Social History:  The patient  reports that he quit smoking about 8 months ago. His smoking use included Cigarettes. He smoked 0.00 packs per day. He has never used smokeless tobacco. He reports that he uses drugs, including Marijuana. He reports that he does not drink alcohol.   Family History:  The patient's family history includes Ulcers in his father; Vision loss in his mother.    ROS:  Please see the history of present illness.   Otherwise, review of systems are positive for none.   All other systems are reviewed and negative.    PHYSICAL EXAM: VS:  BP (!) 98/58   Pulse 66   Ht 6' (1.829 m)   Wt 110.7 kg (244 lb)   BMI 33.09 kg/m  , BMI Body mass index is 33.09 kg/m. GENERAL:  Well appearing.  No acute distress HEENT:  Pupils equal round and reactive, fundi not visualized, oral  mucosa unremarkable NECK:  No jugular venous distention, waveform within normal limits, carotid upstroke brisk and symmetric, no bruits LYMPHATICS:  No cervical adenopathy LUNGS:  Diffuse expiratory wheezing.  Prolonged expiratory phase. HEART:  RRR.  PMI not displaced or sustained,S1 and S2 within normal limits, no S3, no S4, no clicks, no rubs, no murmurs ABD:  Flat, positive bowel sounds normal in frequency in pitch, no bruits, no rebound, no guarding, no midline pulsatile mass, no hepatomegaly, no splenomegaly EXT:  1+DP.  TP not palpable.  No edema.  R BKA.  SKIN:  No rashes no nodules NEURO:  Cranial nerves II through XII grossly intact, motor grossly intact throughout PSYCH:  Cognitively intact, oriented to person place and time   EKG:  EKG is not ordered today. The ekg ordered 4/4/17demonstrates sinus rhythm. Rate 74 bpm. Low voltage limb leads.  Echo 01/21/16: Study Conclusions  - Left ventricle: The cavity size was mildly dilated. Wall   thickness was increased in a pattern of mild LVH. Systolic   function was severely reduced. The estimated ejection fraction   was in the range of 25% to 30%. Akinesis of the apical, apical   septum, mid-apical inferior, mid-apical anterior myocardium.   Hypokinesis of the basal anteroseptal Doppler parameters are   consistent with abnormal left ventricular relaxation (grade 1   diastolic dysfunction). Doppler parameters are consistent with   indeterminate ventricular filling pressure. - Regional wall motion abnormality: Akinesis of the mid-apical   anterior, apical inferior, apical septal, apical lateral, and   apical myocardium; hypokinesis of the basal anterior, basal-mid   anteroseptal, basal-mid inferoseptal, basal-mid inferior,   basal-mid inferolateral, and basal-mid anterolateral myocardium. - Aortic valve: Transvalvular velocity was within the normal range.   There was no stenosis. There was no regurgitation. - Mitral valve:  Transvalvular velocity was within the normal range.   There was no evidence for stenosis. There was no regurgitation. - Right ventricle: The cavity size was normal. Wall thickness was  normal. Systolic function was normal. - Tricuspid valve: There was no regurgitation.  Echo 05/26/16: Study Conclusions  - Left ventricle: Systolic function was severely reduced. The   estimated ejection fraction was in the range of 20% to 25%.   Akinesis of the mid-apicalanteroseptal, inferior, and apical   myocardium. Doppler parameters are consistent with abnormal left   ventricular relaxation (grade 1 diastolic dysfunction). - Pericardium, extracardiac: A trivial pericardial effusion was   identified.  Impressions:  - This echo is extremely difficult.   The valve are not seen well enough to assess.   Definity contrast reveals anterior apical and inferior apical   akinesis.   The echo quality is not sufficient to assess other wall motion  LHC 05/26/16:  A STENT XIENCE ALPINE RX [id number] drug eluting stent was successfully placed, and does not overlap previously placed stent.  Prox LAD lesion, 100 %stenosed.  Post intervention, there is a 0% residual stenosis.  LV end diastolic pressure is mildly elevated.  There is mild to moderate left ventricular systolic dysfunction.   Echo 01/20/17: Study Conclusions  - Left ventricle: The cavity size was mildly dilated. Wall   thickness was increased in a pattern of mild LVH. Systolic   function was severely reduced. The estimated ejection fraction   was in the range of 25% to 30%. Akinesis of the apical, apical   septum, mid-apical inferior, mid-apical anterior myocardium.   Hypokinesis of the basal anteroseptal Doppler parameters are   consistent with abnormal left ventricular relaxation (grade 1   diastolic dysfunction). Doppler parameters are consistent with   indeterminate ventricular filling pressure. - Regional wall motion abnormality:  Akinesis of the mid-apical   anterior, apical inferior, apical septal, apical lateral, and   apical myocardium; hypokinesis of the basal anterior, basal-mid   anteroseptal, basal-mid inferoseptal, basal-mid inferior,   basal-mid inferolateral, and basal-mid anterolateral myocardium. - Aortic valve: Transvalvular velocity was within the normal range.   There was no stenosis. There was no regurgitation. - Mitral valve: Transvalvular velocity was within the normal range.   There was no evidence for stenosis. There was no regurgitation. - Right ventricle: The cavity size was normal. Wall thickness was   normal. Systolic function was normal. - Tricuspid valve: There was no regurgitation.   Recent Labs: 05/26/2016: ALT 48; Hemoglobin 17.0; Platelets 338 07/09/2016: BUN 12; Creatinine, Ser 0.97; Potassium 4.8; Sodium 136   Lipid Panel    Component Value Date/Time   CHOL 154 05/26/2016 0459   TRIG 177 (H) 05/26/2016 0459   HDL 44 05/26/2016 0459   CHOLHDL 3.5 05/26/2016 0459   VLDL 35 05/26/2016 0459   LDLCALC 75 05/26/2016 0459      Wt Readings from Last 3 Encounters:  02/01/17 110.7 kg (244 lb)  01/27/17 111.9 kg (246 lb 9.6 oz)  09/06/16 111.9 kg (246 lb 12.8 oz)      ASSESSMENT AND PLAN:  # CAD s/p STEMI: Mr. Fountain had a large MI 05/26/16.  He had a DES placed in the LAD.  He has been feeling well and denies exertional chest pain.  Continue aspirin and clopidorel though 04/2017. Continue atorvastatin and carvedilol.  # Chronic systolic and diastolic heart failure:  Mr. Ojeda is euvolemic on exam.  BP prohibits further titration of medication.  It is too low for Entresto.  We discussed ICD given that LVEF remains <35%.  He is thinking about whether he wants an ICD.  We dicussed thi for 30 minutes and he was provided  additional information to read.  He is willing to schedule consultation with EP.  # PAD: Continue aspirin and statin.  Encouraged smoking cessation.  Continue follow up  with vascular surgery.  # Tobacco abuse: 5 minutes of smoking cessation counseling.  He will start Chantix and patches.    Current medicines are reviewed at length with the patient today.  The patient does not have concerns regarding medicines.  The following changes have been made:  Start Chantix.  Labs/ tests ordered today include:   Orders Placed This Encounter  Procedures  . Ambulatory referral to Cardiac Electrophysiology     Disposition:   FU with Kayven Aldaco C. Oval Linsey, MD, [facility] in 6 months.     This note was written with the assistance of speech recognition software.  Please excuse any transcriptional errors.  Signed, Nivia Gervase C. Oval Linsey, MD, [facility]  02/01/2017 6:06 PM    [place]

## 2017-02-03 ENCOUNTER — Ambulatory Visit: Payer: Medicare Other | Admitting: Vascular Surgery

## 2017-03-15 ENCOUNTER — Other Ambulatory Visit: Payer: Self-pay | Admitting: Cardiovascular Disease

## 2017-03-15 NOTE — Telephone Encounter (Signed)
Refill Request.  

## 2017-04-06 DIAGNOSIS — E748 Other specified disorders of carbohydrate metabolism: Secondary | ICD-10-CM | POA: Diagnosis not present

## 2017-04-06 DIAGNOSIS — Z79891 Long term (current) use of opiate analgesic: Secondary | ICD-10-CM | POA: Diagnosis not present

## 2017-04-06 DIAGNOSIS — Z0000 Encounter for general adult medical examination without abnormal findings: Secondary | ICD-10-CM | POA: Diagnosis not present

## 2017-04-06 DIAGNOSIS — I2510 Atherosclerotic heart disease of native coronary artery without angina pectoris: Secondary | ICD-10-CM | POA: Diagnosis not present

## 2017-04-06 DIAGNOSIS — E063 Autoimmune thyroiditis: Secondary | ICD-10-CM | POA: Diagnosis not present

## 2017-04-06 DIAGNOSIS — G894 Chronic pain syndrome: Secondary | ICD-10-CM | POA: Diagnosis not present

## 2017-04-06 DIAGNOSIS — G629 Polyneuropathy, unspecified: Secondary | ICD-10-CM | POA: Diagnosis not present

## 2017-04-19 ENCOUNTER — Institutional Professional Consult (permissible substitution): Payer: Medicare Other | Admitting: Internal Medicine

## 2017-04-27 ENCOUNTER — Telehealth: Payer: Self-pay | Admitting: *Deleted

## 2017-04-27 NOTE — Telephone Encounter (Signed)
Spoke with patient and he stated he did want to reschedule his appointment with Dr Caryl Comes but he is out of town. Requested I call home number and leave message for his wife. Tried to call number, no answer. Will try again later

## 2017-05-11 ENCOUNTER — Encounter: Payer: Self-pay | Admitting: Internal Medicine

## 2017-05-19 ENCOUNTER — Telehealth: Payer: Self-pay | Admitting: Internal Medicine

## 2017-05-19 NOTE — Telephone Encounter (Signed)
Pt's wife called to say that patient is due for his 3 yr colonoscopy, He isn't having any GI problems and has [employer]. I told her that the triage nurse would be contacting him back [phone]

## 2017-05-31 NOTE — Telephone Encounter (Signed)
Pt has appt on 07/13/2017 at 2:00 pm with Neil Crouch, PA. He has Medicaid.

## 2017-06-01 ENCOUNTER — Telehealth: Payer: Self-pay

## 2017-06-01 NOTE — Telephone Encounter (Signed)
LMOM to call back

## 2017-06-06 NOTE — Telephone Encounter (Signed)
Ok thanks. He has an appointment on 07/13/17 @ 2:0 pm.

## 2017-06-06 NOTE — Telephone Encounter (Signed)
Gastroenterology Pre-Procedure Review  Request Date: Requesting Physician:   LAST TCS WAS 05/30/14: TUBULAR POLYPS  PATIENT REVIEW QUESTIONS: The patient responded to the following health history questions as indicated:    1. Diabetes Melitis: NO 2. Joint replacements in the past 12 months: NO 3. Major health problems in the past 3 months: NO 4. Has an artificial valve or MVP: NO 5. Has a defibrillator: NO 6. Has been advised in past to take antibiotics in advance of a procedure like teeth cleaning: NO 7. Family history of colon cancer: NO 8. Alcohol Use: YES, 12 PACK A WEEK 9. History of sleep apnea: NO 10. History of coronary artery or other vascular stents placed within the last 12 months: YES, 05/26/2016 11. History of any prior anesthesia complications: NO    MEDICATIONS & ALLERGIES:    Patient reports the following regarding taking any blood thinners:   Plavix? YES Aspirin? YES Coumadin? NO Brilinta? NO Xarelto? NO Eliquis? NO Pradaxa? NO Savaysa? NO Effient? NO  Patient confirms/reports the following medications:  Current Outpatient Prescriptions  Medication Sig Dispense Refill  . alprazolam (XANAX) 2 MG tablet Take 2 mg by mouth 4 (four) times daily as needed for anxiety.     [name] amitriptyline (ELAVIL) 50 MG tablet Take 100 mg by mouth at bedtime.     [name] aspirin 81 MG EC tablet Take 81 mg by mouth daily.      [name] atorvastatin (LIPITOR) 80 MG tablet Take 1 tablet (80 mg total) by mouth daily at 6 PM. 30 tablet 12  . carvedilol (COREG) 6.25 MG tablet TAKE 1 TABLET BY MOUTH TWICE A DAY WITH A MEAL 60 tablet 11  . citalopram (CELEXA) 40 MG tablet Take 40 mg by mouth daily.  11  . clopidogrel (PLAVIX) 75 MG tablet Take 1 tablet (75 mg total) by mouth daily. (First dose take 4 tablet) 30 tablet 11  . HYDROcodone-acetaminophen (NORCO) 10-325 MG tablet Take 1 tablet by mouth every 4 (four) hours as needed.  0  . lisinopril (PRINIVIL,ZESTRIL) 2.5 MG tablet Take 1 tablet (2.5 mg  total) by mouth daily. 30 tablet 12  . nitroGLYCERIN (NITROSTAT) 0.4 MG SL tablet Place 1 tablet (0.4 mg total) under the tongue every 5 (five) minutes x 3 doses as needed for chest pain. 25 tablet 2  . nicotine (NICODERM CQ - DOSED IN MG/24 HOURS) 14 mg/24hr patch Place 1 patch (14 mg total) onto the skin daily. 28 patch 0   No current facility-administered medications for this visit.     Patient confirms/reports the following allergies:  Allergies  Allergen Reactions  . Lyrica [Pregabalin] Palpitations    Chest pain and heart palps.  . Neurontin [Gabapentin] Palpitations    Chest pain and heart palp    No orders of the defined types were placed in this encounter.   AUTHORIZATION INFORMATION Primary Insurance:[employer] [place] ,  ID [id number] ,  Group #: [id number] Pre-Cert / Josem Kaufmann required:  Pre-Cert / Auth #:   SCHEDULE INFORMATION: Procedure has been scheduled as follows:  Date: , Time:   Location:   This Gastroenterology Pre-Precedure Review Form is being routed to the following provider(s):  [email or web address]

## 2017-06-06 NOTE — Telephone Encounter (Signed)
Needs to come in for an appt due to need for Propofol. I believe he has one with Magda Paganini upcoming?

## 2017-06-07 NOTE — Telephone Encounter (Signed)
Tried to call

## 2017-06-09 NOTE — Telephone Encounter (Signed)
Letter mailed out with appointment

## 2017-07-08 DIAGNOSIS — G894 Chronic pain syndrome: Secondary | ICD-10-CM | POA: Diagnosis not present

## 2017-07-08 DIAGNOSIS — Z1389 Encounter for screening for other disorder: Secondary | ICD-10-CM | POA: Diagnosis not present

## 2017-07-08 DIAGNOSIS — G546 Phantom limb syndrome with pain: Secondary | ICD-10-CM | POA: Diagnosis not present

## 2017-07-08 DIAGNOSIS — I2102 ST elevation (STEMI) myocardial infarction involving left anterior descending coronary artery: Secondary | ICD-10-CM | POA: Diagnosis not present

## 2017-07-13 ENCOUNTER — Ambulatory Visit: Payer: Medicare Other | Admitting: Gastroenterology

## 2017-09-08 ENCOUNTER — Ambulatory Visit: Payer: Medicare Other | Admitting: Gastroenterology

## 2017-10-10 DIAGNOSIS — G894 Chronic pain syndrome: Secondary | ICD-10-CM | POA: Diagnosis not present

## 2017-10-10 DIAGNOSIS — G546 Phantom limb syndrome with pain: Secondary | ICD-10-CM | POA: Diagnosis not present

## 2017-10-10 DIAGNOSIS — I739 Peripheral vascular disease, unspecified: Secondary | ICD-10-CM | POA: Diagnosis not present

## 2017-10-10 DIAGNOSIS — N400 Enlarged prostate without lower urinary tract symptoms: Secondary | ICD-10-CM | POA: Diagnosis not present

## 2017-10-31 ENCOUNTER — Other Ambulatory Visit: Payer: Self-pay | Admitting: Cardiovascular Disease

## 2017-10-31 NOTE — Telephone Encounter (Signed)
Rx(s) sent to pharmacy electronically.  

## 2017-10-31 NOTE — Telephone Encounter (Signed)
Refill Request.  

## 2017-11-08 ENCOUNTER — Ambulatory Visit: Payer: Medicare Other | Admitting: Gastroenterology

## 2017-11-15 DIAGNOSIS — Z89511 Acquired absence of right leg below knee: Secondary | ICD-10-CM | POA: Diagnosis not present

## 2017-11-15 DIAGNOSIS — G894 Chronic pain syndrome: Secondary | ICD-10-CM | POA: Diagnosis not present

## 2017-11-15 DIAGNOSIS — G546 Phantom limb syndrome with pain: Secondary | ICD-10-CM | POA: Diagnosis not present

## 2017-12-01 ENCOUNTER — Encounter: Payer: Self-pay | Admitting: Internal Medicine

## 2018-01-03 DIAGNOSIS — I5042 Chronic combined systolic (congestive) and diastolic (congestive) heart failure: Secondary | ICD-10-CM | POA: Diagnosis not present

## 2018-01-03 DIAGNOSIS — R300 Dysuria: Secondary | ICD-10-CM | POA: Diagnosis not present

## 2018-01-03 DIAGNOSIS — G894 Chronic pain syndrome: Secondary | ICD-10-CM | POA: Diagnosis not present

## 2018-01-28 ENCOUNTER — Other Ambulatory Visit: Payer: Self-pay | Admitting: Cardiovascular Disease

## 2018-01-30 NOTE — Telephone Encounter (Signed)
REFILL 

## 2018-01-31 ENCOUNTER — Encounter: Payer: Self-pay | Admitting: Cardiovascular Disease

## 2018-01-31 ENCOUNTER — Ambulatory Visit: Payer: Medicare Other | Admitting: Cardiovascular Disease

## 2018-01-31 VITALS — BP 118/70 | HR 66 | Ht 72.0 in | Wt 244.0 lb

## 2018-01-31 DIAGNOSIS — E063 Autoimmune thyroiditis: Secondary | ICD-10-CM | POA: Diagnosis not present

## 2018-01-31 DIAGNOSIS — E785 Hyperlipidemia, unspecified: Secondary | ICD-10-CM | POA: Diagnosis not present

## 2018-01-31 DIAGNOSIS — I5042 Chronic combined systolic (congestive) and diastolic (congestive) heart failure: Secondary | ICD-10-CM | POA: Diagnosis not present

## 2018-01-31 DIAGNOSIS — I2510 Atherosclerotic heart disease of native coronary artery without angina pectoris: Secondary | ICD-10-CM | POA: Diagnosis not present

## 2018-01-31 DIAGNOSIS — Z720 Tobacco use: Secondary | ICD-10-CM | POA: Diagnosis not present

## 2018-01-31 DIAGNOSIS — I739 Peripheral vascular disease, unspecified: Secondary | ICD-10-CM

## 2018-01-31 DIAGNOSIS — Z79899 Other long term (current) drug therapy: Secondary | ICD-10-CM

## 2018-01-31 LAB — COMPREHENSIVE METABOLIC PANEL WITH GFR
ALT: 30 IU/L (ref 0–44)
AST: 26 IU/L (ref 0–40)
Albumin/Globulin Ratio: 2.1 (ref 1.2–2.2)
Albumin: 4.4 g/dL (ref 3.5–5.5)
Alkaline Phosphatase: 128 IU/L — ABNORMAL HIGH (ref 39–117)
BUN/Creatinine Ratio: 8 — ABNORMAL LOW (ref 9–20)
BUN: 7 mg/dL (ref 6–24)
Bilirubin Total: 0.4 mg/dL (ref 0.0–1.2)
CO2: 24 mmol/L (ref 20–29)
Calcium: 9 mg/dL (ref 8.7–10.2)
Chloride: 100 mmol/L (ref 96–106)
Creatinine, Ser: 0.87 mg/dL (ref 0.76–1.27)
GFR calc Af Amer: 112 mL/min/1.73
GFR calc non Af Amer: 97 mL/min/1.73
Globulin, Total: 2.1 g/dL (ref 1.5–4.5)
Glucose: 103 mg/dL — ABNORMAL HIGH (ref 65–99)
Potassium: 4.4 mmol/L (ref 3.5–5.2)
Sodium: 140 mmol/L (ref 134–144)
Total Protein: 6.5 g/dL (ref 6.0–8.5)

## 2018-01-31 LAB — LIPID PANEL
Chol/HDL Ratio: 2.4 ratio (ref 0.0–5.0)
Cholesterol, Total: 93 mg/dL — ABNORMAL LOW (ref 100–199)
HDL: 39 mg/dL — AB (ref >39)
LDL Calculated: 38 mg/dL (ref 0–99)
TRIGLYCERIDES: 81 mg/dL (ref 0–149)
VLDL CHOLESTEROL CAL: 16 mg/dL (ref 5–40)

## 2018-01-31 NOTE — Progress Notes (Signed)
Cardiology Office Note   Date:  01/31/2018   ID:  [name], DOB 1961-01-09, MRN [id number]  PCP:  Redmond School, MD  Cardiologist:   [name], MD  Vascular Surgeon: Ruta Hinds, M.D.  No chief complaint on file.     History of Present Illness: [name] is a 57 y.o. 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   [name] was initially seen 01/2016 for surgical clearance for CLI.   On 11/2015 he presented to Dr. Oneida Alar office and was referred for an abdominal aortogram.  This was performed on 12/30/15 and demonstrated a patent aortoiliac system on the left with a patent left common iliac stent. He had occlusion of the right internal iliac artery, the left superficial femoral and popliteal arteries.  He had 2 vessel runoff of the L foot posterior tibial and anterior tibial with diminutive peroneal.  He didn't have any exertional symptoms and was cleared for surgery.  He presented for bypass grafting from the femoral to posterior tibial artery on the left but his lower extremity wound healed, so the surgery was cancelled.  This procedure was performed due to gangrene of the left foot.    On 05/26/16 [name] presented with an anterior STEMI.  He had 100% LAD occlusion treated with a Xience DES.  LVEF was 40% by left ventriculography.  Troponin was >65.  Echo revealed LVEF 20-25% with focal wall motion abnormalities. Carvedilol was increased.  He had a repeat echo 01/20/17 that revealed LVEF 25-30% with akinesis of the apex, apical septum, mid-apical inferior, mid-apical anterior myocardium.  It also showed hypokinesis of the basal inferoseptum and grade 1 diastolic dysfunction.  [name] was instr to follow-up in 6 months but has not been seen for a year.  At his last appointment we  discussed ICD and he was going to think about it.  He is now interested in pursuing a defibrillator if needed.ucted he is not getting any exercise.  He has not had any chest pain or shortness of breath.  He also denies orthopnea or PND.  He is getting ready to go to [place] to check on his father who is having peripheral arterial disease in his right leg as well.  He and his wife are planning to quit smoking.  They called [phone] quit now and have patches and lozenges ready.   Past Medical History:  Diagnosis Date  . Anxiety   . Asthma   . Depression   . DVT (deep venous thrombosis) ([place]) 2009  . Hepatitis C    no prior treatment  -   did treatment 2015  . STEMI (ST elevation myocardial infarction) ([place]) 05/26/2016  . Tobacco abuse 01/28/2016    Past Surgical History:  Procedure Laterality Date  . AORTOGRAM  08/08/2009   for LLE claudication     By Dr. Oneida Alar  . 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 BIOPSY N/A 05/30/2014   Procedure: BIOPSY;  Surgeon: [name], MD;  Location: [facility];  Service: Endoscopy;  Laterality: N/A;  . BLADDER TUMOR EXCISION  8/812  . CARDIAC CATHETERIZATION N/A 05/26/2016   Procedure: Left Heart Cath and Coronary Angiography;  Surgeon: [name], MD;  Location: [facility] [place] [facility];  Service: Cardiovascular;  Laterality: N/A;  . CARDIAC CATHETERIZATION N/A 05/26/2016   Procedure: Coronary Stent Intervention;  Surgeon: [name], MD;  Location: [place] [facility];  Service: Cardiovascular;  Laterality: N/A;  . CHOLECYSTECTOMY    . COLONOSCOPY WITH PROPOFOL N/A 05/30/2014   YQM:VHQION colonic polyp-likely source of hematochezia-removed as described above  . ESOPHAGOGASTRODUODENOSCOPY (EGD) WITH PROPOFOL N/A 05/30/2014   GEX:BMWUXL and bulbar erosions s/p gastric biopsy. No evidence of portal gastropathy on today's examination.  . FEMORAL-TIBIAL BYPASS GRAFT  2009   Right side using non-reversed GSV   By Dr. Oneida Alar  . FEMORAL-TIBIAL BYPASS GRAFT   11/24/2007   Right femoral to anterior tibial BPG   by Dr. Oneida Alar  . HAND TENDON SURGERY Left 2013   Left 5th finger  . HERNIA REPAIR    . INCISIONAL HERNIA REPAIR N/A 12/25/2014   Procedure: HERNIA REPAIR INCISIONAL WITH MESH;  Surgeon: [name], MD;  Location: [facility];  Service: General;  Laterality: N/A;  . INSERTION OF MESH N/A 12/25/2014   Procedure: INSERTION OF MESH;  Surgeon: [name], MD;  Location: [facility];  Service: General;  Laterality: N/A;  . LOWER EXTREMITY ANGIOGRAM Left 12/30/2015   Procedure: Lower Extremity Angiogram;  Surgeon: [name], MD;  Location: [facility];  Service: Cardiovascular;  Laterality: Left;  . PERIPHERAL VASCULAR CATHETERIZATION N/A 12/30/2015   Procedure: Abdominal Aortogram;  Surgeon: [name], MD;  Location: [facility] [facility];  Service: Cardiovascular;  Laterality: N/A;  . POLYPECTOMY  05/30/2014   Procedure: POLYPECTOMY;  Surgeon: [name], MD;  Location: [facility];  Service: Endoscopy;;  . TONSILLECTOMY       Current Outpatient Medications  Medication Sig Dispense Refill  . alprazolam (XANAX) 2 MG tablet Take 2 mg by mouth 4 (four) times daily as needed for anxiety.     [name] amitriptyline (ELAVIL) 50 MG tablet Take 100 mg by mouth at bedtime.     [name] aspirin 81 MG EC tablet Take 81 mg by mouth daily.      [name] atorvastatin (LIPITOR) 80 MG tablet Take 1 tablet (80 mg total) by mouth daily at 6 PM. 30 tablet 12  . carvedilol (COREG) 6.25 MG tablet TAKE 1 TABLET BY MOUTH TWICE A DAY WITH A MEAL 60 tablet 11  . citalopram (CELEXA) 40 MG tablet Take 40 mg by mouth daily.  11  . HYDROcodone-acetaminophen (NORCO) 10-325 MG tablet Take 1 tablet by mouth every 4 (four) hours as needed.  0  . lisinopril (PRINIVIL,ZESTRIL) 2.5 MG tablet Take 1 tablet (2.5 mg total) by mouth daily. 30 tablet 12  . nicotine (NICODERM CQ - DOSED IN MG/24 HOURS) 14 mg/24hr patch Place 1 patch (14 mg total) onto the skin daily. 28 patch 0  .  nitroGLYCERIN (NITROSTAT) 0.4 MG SL tablet Place 1 tablet (0.4 mg total) under the tongue every 5 (five) minutes x 3 doses as needed for chest pain. 25 tablet 2  . tamsulosin (FLOMAX) 0.4 MG CAPS capsule Take 1 capsule by mouth daily.     No current facility-administered medications for this visit.     Allergies:   Lyrica [pregabalin] and Neurontin [gabapentin]    Social History:  The patient  reports that he quit smoking about 20 months ago. His smoking use included cigarettes. He smoked 0.00 packs per day. He has never used smokeless tobacco. He reports that he has current or past drug history. Drug: Marijuana. He reports that he does  not drink alcohol.   Family History:  The patient's family history includes Ulcers in his father; Vision loss in his mother.    ROS:  Please see the history of present illness.   Otherwise, review of systems are positive for none.   All other systems are reviewed and negative.    PHYSICAL EXAM: VS:  BP 118/70   Pulse 66   Ht 6' (1.829 m)   Wt 244 lb (110.7 kg)   BMI 33.09 kg/m  , BMI Body mass index is 33.09 kg/m. GENERAL:  Well appearing HEENT: Pupils equal round and reactive, fundi not visualized, oral mucosa unremarkable NECK:  No jugular venous distention, waveform within normal limits, carotid upstroke brisk and symmetric, no bruits, no thyromegaly LYMPHATICS:  No cervical adenopathy LUNGS:  Clear to auscultation bilaterally HEART:  RRR.  PMI not displaced or sustained,S1 and S2 within normal limits, no S3, no S4, no clicks, no rubs, no murmurs ABD:  Flat, positive bowel sounds normal in frequency in pitch, no bruits, no rebound, no guarding, no midline pulsatile mass, no hepatomegaly, no splenomegaly EXT:  2 plus pulses throughout, no edema, no cyanosis no clubbing.  R BKA SKIN:  No rashes no nodules NEURO:  Cranial nerves II through XII grossly intact, motor grossly intact throughout PSYCH:  Cognitively intact, oriented to person place and  time   EKG:  EKG is ordered today. The ekg ordered 4/4/17demonstrates sinus rhythm. Rate 74 bpm. Low voltage limb leads.  01/31/2018: Sinus rhythm.  Rate 66 bpm.  Prior anteroseptal infarct.  Cannot rule prior inferior infarct.  Low voltage QRS.  Echo 01/21/16: Study Conclusions  - Left ventricle: The cavity size was mildly dilated. Wall   thickness was increased in a pattern of mild LVH. Systolic   function was severely reduced. The estimated ejection fraction   was in the range of 25% to 30%. Akinesis of the apical, apical   septum, mid-apical inferior, mid-apical anterior myocardium.   Hypokinesis of the basal anteroseptal Doppler parameters are   consistent with abnormal left ventricular relaxation (grade 1   diastolic dysfunction). Doppler parameters are consistent with   indeterminate ventricular filling pressure. - Regional wall motion abnormality: Akinesis of the mid-apical   anterior, apical inferior, apical septal, apical lateral, and   apical myocardium; hypokinesis of the basal anterior, basal-mid   anteroseptal, basal-mid inferoseptal, basal-mid inferior,   basal-mid inferolateral, and basal-mid anterolateral myocardium. - Aortic valve: Transvalvular velocity was within the normal range.   There was no stenosis. There was no regurgitation. - Mitral valve: Transvalvular velocity was within the normal range.   There was no evidence for stenosis. There was no regurgitation. - Right ventricle: The cavity size was normal. Wall thickness was   normal. Systolic function was normal. - Tricuspid valve: There was no regurgitation.  Echo 05/26/16: Study Conclusions  - Left ventricle: Systolic function was severely reduced. The   estimated ejection fraction was in the range of 20% to 25%.   Akinesis of the mid-apicalanteroseptal, inferior, and apical   myocardium. Doppler parameters are consistent with abnormal left   ventricular relaxation (grade 1 diastolic dysfunction). -  Pericardium, extracardiac: A trivial pericardial effusion was   identified.  Impressions:  - This echo is extremely difficult.   The valve are not seen well enough to assess.   Definity contrast reveals anterior apical and inferior apical   akinesis.   The echo quality is not sufficient to assess other wall motion  LHC 05/26/16:  A STENT XIENCE ALPINE RX [id number] drug eluting stent was successfully placed, and does not overlap previously placed stent.  Prox LAD lesion, 100 %stenosed.  Post intervention, there is a 0% residual stenosis.  LV end diastolic pressure is mildly elevated.  There is mild to moderate left ventricular systolic dysfunction.   Echo 01/20/17: Study Conclusions  - Left ventricle: The cavity size was mildly dilated. Wall   thickness was increased in a pattern of mild LVH. Systolic   function was severely reduced. The estimated ejection fraction   was in the range of 25% to 30%. Akinesis of the apical, apical   septum, mid-apical inferior, mid-apical anterior myocardium.   Hypokinesis of the basal anteroseptal Doppler parameters are   consistent with abnormal left ventricular relaxation (grade 1   diastolic dysfunction). Doppler parameters are consistent with   indeterminate ventricular filling pressure. - Regional wall motion abnormality: Akinesis of the mid-apical   anterior, apical inferior, apical septal, apical lateral, and   apical myocardium; hypokinesis of the basal anterior, basal-mid   anteroseptal, basal-mid inferoseptal, basal-mid inferior,   basal-mid inferolateral, and basal-mid anterolateral myocardium. - Aortic valve: Transvalvular velocity was within the normal range.   There was no stenosis. There was no regurgitation. - Mitral valve: Transvalvular velocity was within the normal range.   There was no evidence for stenosis. There was no regurgitation. - Right ventricle: The cavity size was normal. Wall thickness was   normal. Systolic  function was normal. - Tricuspid valve: There was no regurgitation.   Recent Labs: No results found for requested labs within last 8760 hours.   Lipid Panel    Component Value Date/Time   CHOL 154 05/26/2016 0459   TRIG 177 (H) 05/26/2016 0459   HDL 44 05/26/2016 0459   CHOLHDL 3.5 05/26/2016 0459   VLDL 35 05/26/2016 0459   LDLCALC 75 05/26/2016 0459      Wt Readings from Last 3 Encounters:  01/31/18 244 lb (110.7 kg)  02/01/17 244 lb (110.7 kg)  01/27/17 246 lb 9.6 oz (111.9 kg)      ASSESSMENT AND PLAN:  # CAD s/p STEMI:  # Hyperlipidemia:  [name] had a large MI 05/26/16.  He had a DES placed in the LAD.  He has been feeling well and denies exertional chest pain. Stop clopidogrel.  Tinea aspirin 81 mg daily.  Continue carvedilol, atorvastatin, and lisinopril.  Check lipids and CMP today.  # Chronic systolic and diastolic heart failure:  [name] is euvolemic on exam.  BP prohibits further titration of medication.  He is now willing to consider ICD.  We will repeat his echocardiogram to see if his LVEF remains less than 35%.  If so we will refer him to EP.  Continue carvedilol and lisinopril.  Consider spironolactone.  # PAD: Continue aspirin and statin.  Encouraged smoking cessation.  Continue follow up with vascular surgery.  # Tobacco abuse: He is ready to start using nicotine replacement therapy.    Current medicines are reviewed at length with the patient today.  The patient does not have concerns regarding medicines.  The following changes have been made:  none  Labs/ tests ordered today include:   Orders Placed This Encounter  Procedures  . Lipid panel  . Comprehensive metabolic panel  . EKG 12-Lead  . ECHOCARDIOGRAM COMPLETE     Disposition:   FU with Eileen Kangas C. Oval Linsey, MD, [facility] in 6 months.      Signed, Keiarra Charon C. Oval Linsey, MD, [facility]  01/31/2018 11:17 AM    [facility]

## 2018-01-31 NOTE — Patient Instructions (Addendum)
Medication Instructions:  STOP CLOPIDOGREL    Labwork: LP/CMET TODAY   Testing/Procedures: Your physician has requested that you have an echocardiogram. Echocardiography is a painless test that uses sound waves to create images of your heart. It provides your doctor with information about the size and shape of your heart and how well your heart's chambers and valves are working. This procedure takes approximately one hour. There are no restrictions for this procedure. [address]  Follow-Up: Your physician wants you to follow-up in: [place] will receive a reminder letter in the mail two months in advance. If you don't receive a letter, please call our office to schedule the follow-up appointment.   If you need a refill on your cardiac medications before your next appointment, please call your pharmacy.

## 2018-02-02 ENCOUNTER — Other Ambulatory Visit: Payer: Self-pay

## 2018-02-02 ENCOUNTER — Encounter: Payer: Self-pay | Admitting: Vascular Surgery

## 2018-02-02 ENCOUNTER — Ambulatory Visit: Payer: Medicare Other | Admitting: Physician Assistant

## 2018-02-02 ENCOUNTER — Ambulatory Visit (HOSPITAL_COMMUNITY)
Admission: RE | Admit: 2018-02-02 | Discharge: 2018-02-02 | Disposition: A | Discharge status: Home / Self Care | Payer: Medicare Other | Source: Ambulatory Visit | Attending: Vascular Surgery | Admitting: Vascular Surgery

## 2018-02-02 VITALS — BP 106/73 | HR 66 | Temp 98.1°F | Resp 18 | Ht 72.0 in | Wt 242.0 lb

## 2018-02-02 DIAGNOSIS — F17200 Nicotine dependence, unspecified, uncomplicated: Secondary | ICD-10-CM | POA: Insufficient documentation

## 2018-02-02 DIAGNOSIS — I739 Peripheral vascular disease, unspecified: Secondary | ICD-10-CM

## 2018-02-02 DIAGNOSIS — Z720 Tobacco use: Secondary | ICD-10-CM | POA: Diagnosis not present

## 2018-02-02 DIAGNOSIS — Z89511 Acquired absence of right leg below knee: Secondary | ICD-10-CM | POA: Diagnosis not present

## 2018-02-02 NOTE — Progress Notes (Signed)
Established Intermittent Claudication   History of Present Illness   [name] is a 57 y.o. (1961-09-27) male who presents to go over ABIs.  Patient is status post a right below the knee amputation.  Surgical history also significant for left common iliac artery stenting.  He has a known left SFA and popliteal occlusion.  Patient had been scheduled for a left femoral distal bypass in the past however he was able to heal a wound on his foot prior to surgery day and thus surgery was canceled.  Patient and wife say that he has been walking well with his prosthetic daily.  The wife has a concerned about a pressure-point in the patient's popliteal space caused by his prosthetic limb.  They deny any drainage or bleeding from this area.  Patient also denies any new wounds to his left foot as well as any rest pain.  He is taking aspirin and statin daily.  He continues to smoke daily however is still trying to quit.  The patient's PMH, PSH, SH, and FamHx were reviewed on today's visit are unchanged from prior office visit.  Current Outpatient Medications  Medication Sig Dispense Refill  . alprazolam (XANAX) 2 MG tablet Take 2 mg by mouth 4 (four) times daily as needed for anxiety.     [name] amitriptyline (ELAVIL) 50 MG tablet Take 100 mg by mouth at bedtime.     [name] aspirin 81 MG EC tablet Take 81 mg by mouth daily.      [name] atorvastatin (LIPITOR) 80 MG tablet Take 1 tablet (80 mg total) by mouth daily at 6 PM. 30 tablet 12  . carvedilol (COREG) 6.25 MG tablet TAKE 1 TABLET BY MOUTH TWICE A DAY WITH A MEAL 60 tablet 11  . citalopram (CELEXA) 40 MG tablet Take 40 mg by mouth daily.  11  . HYDROcodone-acetaminophen (NORCO) 10-325 MG tablet Take 1 tablet by mouth every 4 (four) hours as needed.  0  . lisinopril (PRINIVIL,ZESTRIL) 2.5 MG tablet Take 1 tablet (2.5 mg total) by mouth daily. 30 tablet 12  . nicotine (NICODERM CQ - DOSED IN MG/24 HOURS) 14 mg/24hr patch Place 1 patch (14 mg total) onto the skin  daily. 28 patch 0  . nitroGLYCERIN (NITROSTAT) 0.4 MG SL tablet Place 1 tablet (0.4 mg total) under the tongue every 5 (five) minutes x 3 doses as needed for chest pain. 25 tablet 2  . tamsulosin (FLOMAX) 0.4 MG CAPS capsule Take 1 capsule by mouth daily.     No current facility-administered medications for this visit.     On ROS today: 10 system ROS is negative unless otherwise noted in HPI   Physical Examination   Vitals:   02/02/18 1325  BP: 106/73  Pulse: 66  Resp: 18  Temp: 98.1 F (36.7 C)  TempSrc: Oral  SpO2: 96%  Weight: 242 lb (109.8 kg)  Height: 6' (1.829 m)   Body mass index is 32.82 kg/m.  General Alert, O x 3, WD, NAD  Pulmonary Sym exp, good B air movt,   Cardiac RRR, Nl S1, S2,  Vascular Vessel Right Left  Radial Palpable Palpable  Brachial Palpable Palpable  Femoral Palpable Palpable  Popliteal Not palpable Not palpable  PT BKA Not palpable  DP BKA Not palpable    Musculo- skeletal M/S 5/5 throughout  , Extremities without ischemic changes some redness and superficial abrasion right popliteal space however no ulceration, or obvious infection; left foot with a callus at the  base of his first toe however no ulcerations or wounds  , No edema present, No visible varicosities , No Lipodermatosclerosis present  Neurologic A&O    Non-Invasive Vascular imaging   ABI (02/02/18)  R:   BKA  L:   ABI: 0.46,   PT: mono  DP: mono  TBI: 0.35    Medical Decision Making   [name] is a 57 y.o. male who presents to go over vascular studies; he is followed for PAD with history of right BKA and known left SFA and popliteal occlusion   Slight decrease in left ABI however clinically patient is unchanged and without significant claudication, rest pain, or active tissue ischemia  Recommended patient to contact biotech about pressure-point caused by prosthetic  Continue walking program  Continue aspirin and statin regimen  Encouraged smoking  cessation  Recheck ABIs in 1 year; return sooner if rest pain or tissue loss develops in left foot   Dagoberto Ligas, PA-C [facility] Office: [phone]

## 2018-02-07 ENCOUNTER — Other Ambulatory Visit: Payer: Self-pay

## 2018-02-07 ENCOUNTER — Ambulatory Visit (HOSPITAL_COMMUNITY): Payer: Medicare Other | Attending: Cardiovascular Disease

## 2018-02-07 DIAGNOSIS — I5042 Chronic combined systolic (congestive) and diastolic (congestive) heart failure: Secondary | ICD-10-CM | POA: Insufficient documentation

## 2018-02-07 DIAGNOSIS — R29898 Other symptoms and signs involving the musculoskeletal system: Secondary | ICD-10-CM | POA: Insufficient documentation

## 2018-02-07 DIAGNOSIS — Z6832 Body mass index (BMI) 32.0-32.9, adult: Secondary | ICD-10-CM | POA: Diagnosis not present

## 2018-02-07 DIAGNOSIS — Z720 Tobacco use: Secondary | ICD-10-CM | POA: Insufficient documentation

## 2018-02-07 DIAGNOSIS — I517 Cardiomegaly: Secondary | ICD-10-CM | POA: Insufficient documentation

## 2018-02-07 DIAGNOSIS — E669 Obesity, unspecified: Secondary | ICD-10-CM | POA: Diagnosis not present

## 2018-02-07 DIAGNOSIS — I252 Old myocardial infarction: Secondary | ICD-10-CM | POA: Insufficient documentation

## 2018-02-07 MED ORDER — PERFLUTREN LIPID MICROSPHERE
1.0000 mL | INTRAVENOUS | Status: AC | PRN
  Administered 2018-02-07: 3 mL via INTRAVENOUS

## 2018-02-09 ENCOUNTER — Telehealth: Payer: Self-pay | Admitting: Cardiovascular Disease

## 2018-02-09 DIAGNOSIS — Z5181 Encounter for therapeutic drug level monitoring: Secondary | ICD-10-CM

## 2018-02-09 DIAGNOSIS — R931 Abnormal findings on diagnostic imaging of heart and coronary circulation: Secondary | ICD-10-CM

## 2018-02-09 DIAGNOSIS — R9430 Abnormal result of cardiovascular function study, unspecified: Secondary | ICD-10-CM

## 2018-02-09 MED ORDER — SPIRONOLACTONE 25 MG PO TABS: 25.0000 mg | ORAL_TABLET | Freq: Every day | ORAL | 0 refills | Status: DC

## 2018-02-09 NOTE — Telephone Encounter (Signed)
New Message   Patients wife [name] is calling to obtain echocardiogram results. Please call to discuss.

## 2018-02-09 NOTE — Telephone Encounter (Signed)
Advised wife, verbalized understanding. Order placed for Spironolactone 25 mg daily, BMET 1 week, and EP referral.

## 2018-02-09 NOTE — Telephone Encounter (Signed)
-----   Message from [name], MD sent at 02/09/2018  3:37 PM EDT ----- Heart muscle remains weakened.   Will need to be referred to EP for ICD as discussed in clinic.  Cholesterol levels are excellent.  Add spironolactone 25mg  daily and check BMP in 1 week.

## 2018-02-09 NOTE — Telephone Encounter (Signed)
Spoke with pt wife who states she was calling for echo results. Informed wife that the results are in but Dr. Oval Linsey has to review them, then she will have her nurse call with result. Wife verbalized that they are waiting on the result before they travel to [place] to visit pt's parents.  Routing to Nurse

## 2018-02-15 ENCOUNTER — Ambulatory Visit: Payer: Medicare Other | Admitting: Gastroenterology

## 2018-02-17 DIAGNOSIS — Z5181 Encounter for therapeutic drug level monitoring: Secondary | ICD-10-CM | POA: Diagnosis not present

## 2018-02-18 LAB — BASIC METABOLIC PANEL
BUN/Creatinine Ratio: 7 — ABNORMAL LOW (ref 9–20)
BUN: 7 mg/dL (ref 6–24)
CALCIUM: 9.3 mg/dL (ref 8.7–10.2)
CO2: 23 mmol/L (ref 20–29)
CREATININE: 0.98 mg/dL (ref 0.76–1.27)
Chloride: 97 mmol/L (ref 96–106)
GFR calc Af Amer: 100 mL/min/{1.73_m2} (ref >59)
GFR calc non Af Amer: 86 mL/min/{1.73_m2} (ref >59)
Glucose: 81 mg/dL (ref 65–99)
Potassium: 4.6 mmol/L (ref 3.5–5.2)
Sodium: 141 mmol/L (ref 134–144)

## 2018-02-22 NOTE — Telephone Encounter (Signed)
Patient scheduled to see Dr Curt Bears 02/27/18

## 2018-02-27 ENCOUNTER — Ambulatory Visit: Payer: Medicare Other | Admitting: Cardiology

## 2018-02-27 ENCOUNTER — Other Ambulatory Visit: Payer: Self-pay | Admitting: Cardiovascular Disease

## 2018-02-27 ENCOUNTER — Encounter: Payer: Self-pay | Admitting: Cardiology

## 2018-02-27 VITALS — BP 112/80 | HR 71 | Ht 72.0 in | Wt 237.0 lb

## 2018-02-27 DIAGNOSIS — I739 Peripheral vascular disease, unspecified: Secondary | ICD-10-CM | POA: Diagnosis not present

## 2018-02-27 DIAGNOSIS — I255 Ischemic cardiomyopathy: Secondary | ICD-10-CM

## 2018-02-27 DIAGNOSIS — Z01812 Encounter for preprocedural laboratory examination: Secondary | ICD-10-CM | POA: Diagnosis not present

## 2018-02-27 DIAGNOSIS — I2510 Atherosclerotic heart disease of native coronary artery without angina pectoris: Secondary | ICD-10-CM | POA: Diagnosis not present

## 2018-02-27 DIAGNOSIS — Z720 Tobacco use: Secondary | ICD-10-CM

## 2018-02-27 NOTE — Telephone Encounter (Signed)
Rx sent to pharmacy   

## 2018-02-27 NOTE — Patient Instructions (Signed)
Medication Instructions:  Your physician recommends that you continue on your current medications as directed. Please refer to the Current Medication list given to you today.     * If you need a refill on your cardiac medications before your next appointment, please call your pharmacy. *   Labwork: Pre procedure lab work today: BMET & CBC w/ diff * Will notify you of abnormal results, otherwise continue current treatment plan.*   Testing/Procedures: Your physician has recommended that you have a defibrillator inserted. An implantable cardioverter defibrillator (ICD) is a small device that is placed in your chest or, in rare cases, your abdomen. This device uses electrical pulses or shocks to help control life-threatening, irregular heartbeats that could lead the heart to suddenly stop beating (sudden cardiac arrest). Leads are attached to the ICD that goes into your heart. This is done in the hospital and usually requires an overnight stay. Please follow the instructions below, located under the special instructions section.   Follow-Up: Your physician recommends that you schedule a wound check appointment 10-14 days, after your procedure on 03/02/18, with the device clinic.  Your physician recommends that you schedule a follow up appointment in 91 days, after your procedure on 03/02/2018, with Dr. Curt Bears.  * Please note that any paperwork needing to be filled out by the provider will need to be addressed at the front desk prior to seeing the provider.  Please note that any FMLA, disability or other documents regarding health condition is subject to a $25.00 charge that must be received prior to completion of paperwork in the form of a money order or check. *  Thank you for choosing [facility]!!   Trinidad Curet, RN [phone]   Any Other Special Instructions Will Be Listed Below (If Applicable).     Implantable Device Instructions  You are scheduled for:   _____ Implantable Cardioverter Defibrillator  on  03/02/2018  with Dr. Curt Bears.  1.   Please arrive at the [facility], Entrance "A"  at [facility] at  5:30 a.m. on the day of your procedure. (The address is [address]  2. Do not eat or drink after midnight the night before your procedure.  3.   Complete pre procedure  lab work on 02/27/18.  The lab at [facility] is open from 8:00 AM to 4:30 PM.  You do not have to be fasting.  4.   Hold all of your morning medications the morning of your procedure.  5.  Plan for an overnight stay.  Bring your insurance cards and a list of you medications.  6.  Wash your chest and neck with surgical scrub the evening before and the morning of  your procedure.  Rinse well. Please review the surgical scrub instruction sheet given to you.  7. Your chest will need to be shaved prior to this procedure (if needed). We ask that you do this yourself at home 1 to 2 days before or if uncomfortable/unable to do yourself, then it will be performed by the hospital staff the day of.                                                                                                                *  If you have ANY questions after you get home, please call Trinidad Curet, RN @ [phone].  * Every attempt is made to prevent procedures from being rescheduled.  Due to the nature of  Electrophysiology, rescheduling can happen.  The physician is always aware and directs the staff when this occurs.     Cardioverter Defibrillator Implantation An implantable cardioverter defibrillator (ICD) is a small, lightweight, battery-powered device that is placed (implanted) under the skin in the chest or abdomen. Your caregiver may prescribe an ICD if:  You have had an irregular heart rhythm (arrhythmia) that originated in the lower chambers of the heart (ventricles).  Your heart has been damaged by a disease (such as coronary artery disease) or heart  condition (such as a heart attack). An ICD consists of a battery that lasts several years, a small computer called a pulse generator, and wires called leads that go into the heart. It is used to detect and correct two dangerous arrhythmias: a rapid heart rhythm (tachycardia) and an arrhythmia in which the ventricles contract in an uncoordinated way (fibrillation). When an ICD detects tachycardia, it sends an electrical signal to the heart that restores the heartbeat to normal (cardioversion). This signal is usually painless. If cardioversion does not work or if the ICD detects fibrillation, it delivers a small electrical shock to the heart (defibrillation) to restart the heart. The shock may feel like a strong jolt in the chest. ICDs may be programmed to correct other problems. Sometimes, ICDs are programmed to act as another type of implantable device called a pacemaker. Pacemakers are used to treat a slow heartbeat (bradycardia). LET YOUR CAREGIVER KNOW ABOUT:  Any allergies you have.  All medicines you are taking, including vitamins, herbs, eyedrops, and over-the-counter medicines and creams.  Previous problems you or members of your family have had with the use of anesthetics.  Any blood disorders you have had.  Other health problems you have. RISKS AND COMPLICATIONS Generally, the procedure to implant an ICD is safe. However, as with any surgical procedure, complications can occur. Possible complications associated with implanting an ICD include:  Swelling, bleeding, or bruising at the site where the ICD was implanted.  Infection at the site where the ICD was implanted.  A reaction to medicine used during the procedure.  Nerve, heart, or blood vessel damage.  Blood clots. BEFORE THE PROCEDURE  You may need to have blood tests, heart tests, or a chest X-ray done before the day of the procedure.  Ask your caregiver about changing or stopping your regular medicines.  Make plans to  have someone drive you home. You may need to stay in the hospital overnight after the procedure.  Stop smoking at least 24 hours before the procedure.  Take a bath or shower the night before the procedure. You may need to scrub your chest or abdomen with a special type of soap.  Do not eat or drink before your procedure for as long as directed by your caregiver. Ask if it is okay to take any needed medicine with a small sip of water. PROCEDURE  The procedure to implant an ICD in your chest or abdomen is usually done at a hospital in a room that has a large X-ray machine called a fluoroscope. The machine will be above you during the procedure. It will help your caregiver see your heart during the procedure. Implanting an ICD usually takes 1-3 hours. Before the procedure:   Small monitors will be put on your  body. They will be used to check your heart, blood pressure, and oxygen level.  A needle will be put into a vein in your hand or arm. This is called an intravenous (IV) access tube. Fluids and medicine will flow directly into your body through the IV tube.  Your chest or abdomen will be cleaned with a germ-killing (antiseptic) solution. The area may be shaved.  You may be given medicine to help you relax (sedative).  You will be given a medicine called a local anesthetic. This medicine will make the surgical site numb while the ICD is implanted. You will be sleepy but awake during the procedure. After you are numb the procedure will begin. The caregiver will:  Make a small cut (incision). This will make a pocket deep under your skin that will hold the pulse generator.  Guide the leads through a large blood vessel into your heart and attach them to the heart muscles. Depending on the ICD, the leads may go into one ventricle or they may go to both ventricles and into an upper chamber of the heart (atrium).  Test the ICD.  Close the incision with stitches, glue, or staples. AFTER THE  PROCEDURE  You may feel pain. Some pain is normal. It may last a few days.  You may stay in a recovery area until the local anesthetic has worn off. Your blood pressure and pulse will be checked often. You will be taken to a room where your heart will be monitored.  A chest X-ray will be taken. This is done to check that the cardioverter defibrillator is in the right place.  You may stay in the hospital overnight.  A slight bump may be seen over the skin where the ICD was placed. Sometimes, it is possible to feel the ICD under the skin. This is normal.  In the months and years afterward, your caregiver will check the device, the leads, and the battery every few months. Eventually, when the battery is low, the ICD will be replaced.   This information is not intended to replace advice given to you by your health care provider. Make sure you discuss any questions you have with your health care provider.   Document Released: 06/05/2002 Document Revised: 07/04/2013 Document Reviewed: 10/02/2012 Elsevier Interactive Patient Education 2016 [place] Defibrillator Implantation, Care After This sheet gives you information about how to care for yourself after your procedure. Your health care provider may also give you more specific instructions. If you have problems or questions, contact your health care provider. What can I expect after the procedure? After the procedure, it is common to have:  Some pain. It may last a few days.  A slight bump over the skin where the device was placed. Sometimes, it is possible to feel the device under the skin. This is normal.  During the months and years after your procedure, your health care provider will check the device, the leads, and the battery every few months. Eventually, when the battery is low, the device will be replaced. Follow these instructions at home: Medicines  Take over-the-counter and prescription medicines only as  told by your health care provider.  If you were prescribed an antibiotic medicine, take it as told by your health care provider. Do not stop taking the antibiotic even if you start to feel better. Incision care   Follow instructions from your health care provider about how to take care of your incision area. Make sure  you: ? Wash your hands with soap and water before you change your bandage (dressing). If soap and water are not available, use hand sanitizer. ? Change your dressing as told by your health care provider. ? Leave stitches (sutures), skin glue, or adhesive strips in place. These skin closures may need to stay in place for 2 weeks or longer. If adhesive strip edges start to loosen and curl up, you may trim the loose edges. Do not remove adhesive strips completely unless your health care provider tells you to do that.  Check your incision area every day for signs of infection. Check for: ? More redness, swelling, or pain. ? More fluid or blood. ? Warmth. ? Pus or a bad smell.  Do not use lotions or ointments near the incision area unless told by your health care provider.  Keep the incision area clean and dry for 2-3 days after the procedure or for as long as told by your health care provider. It takes several weeks for the incision site to heal completely.  Do not take baths, swim, or use a hot tub until your health care provider approves. Activity  Try to walk a little every day. Exercising is important after this procedure. Also, use your shoulder on the side of the defibrillator in daily tasks that do not require a lot of motion.  For at least 6 weeks: ? Do not lift your upper arm above your shoulders. This means no tennis, golf, or swimming for this period of time. If you tend to sleep with your arm above your head, use a restraint to prevent this during sleep. ? Avoid sudden jerking, pulling, or chopping movements that pull your upper arm far away from your body.  Ask  your health care provider when you may go back to work.  Check with your health care provider before you start to drive or play sports. Electric and magnetic fields  Tell all health care providers that you have a defibrillator. This may prevent them from giving you an MRI scan because strong magnets are used for that test.  If you must pass through a metal detector, quickly walk through it. Do not stop under the detector, and do not stand near it.  Avoid places or objects that have a strong electric or magnetic field, including: ? Airport [employer]. At the airport, let officials know that you have a defibrillator. Your defibrillator ID card will let you be checked in a way that is safe for you and will not damage your defibrillator. Also, do not let a security person wave a magnetic wand near your defibrillator. That can make it stop working. ? Power plants. ? Large electrical generators. ? Anti-theft systems or electronic article surveillance (EAS). ? Radiofrequency transmission towers, such as cell phone and radio towers.  Do not use amateur (ham) radio equipment or electric (arc) welding torches. Some devices are safe to use if held at least 12 inches (30 cm) from your defibrillator. These include power tools, lawn mowers, and speakers. If you are unsure if something is safe to use, ask your health care provider.  Do not use MP3 player headphones. They have magnets.  You may safely use electric blankets, heating pads, computers, and microwave ovens.  When using your cell phone, hold it to the ear that is on the opposite side from the defibrillator. Do not leave your cell phone in a pocket over the defibrillator. General instructions  Follow diet instructions from your health care  provider, if this applies.  Always keep your defibrillator ID card with you. The card should list the implant date, device model, and manufacturer. Consider wearing a medical alert bracelet or  necklace.  Have your defibrillator checked every 3-6 months or as often as told by your health care provider. Most defibrillators last for 4-8 years.  Keep all follow-up visits as told by your health care provider. This is important for your health care provider to make sure your chest is healing the way it should. Ask your health care provider when you should come back to have your stitches or staples taken out. Contact a health care provider if:  You feel one shock in your chest.  You gain weight suddenly.  Your legs or feet swell more than they have before.  It feels like your heart is fluttering or skipping beats (heart palpitations).  You have more redness, swelling, or pain around your incision.  You have more fluid or blood coming from your incision.  Your incision feels warm to the touch.  You have pus or a bad smell coming from your incision.  You have a fever. Get help right away if:  You have chest pain.  You feel more than one shock.  You feel more short of breath than you have felt before.  You feel more light-headed than you have felt before.  Your incision starts to open up. This information is not intended to replace advice given to you by your health care provider. Make sure you discuss any questions you have with your health care provider. Document Released: 04/02/2005 Document Revised: 04/02/2016 Document Reviewed: 02/18/2016 Elsevier Interactive Patient Education  2018 [place] Discharge Instructions for  Pacemaker/Defibrillator Patients  ACTIVITY No heavy lifting or vigorous activity with your left/right arm for 6 to 8 weeks.  Do not raise your left/right arm above your head for one week.  Gradually raise your affected arm as drawn below.           __  NO DRIVING for     ; you may begin driving on     .  WOUND CARE - Keep the wound area clean and dry.  Do not get this area wet for one week. No showers for one week; you  may shower on     . - The tape/steri-strips on your wound will fall off; do not pull them off.  No bandage is needed on the site.  DO  NOT apply any creams, oils, or ointments to the wound area. - If you notice any drainage or discharge from the wound, any swelling or bruising at the site, or you develop a fever > 101? F after you are discharged home, call the office at once.  SPECIAL INSTRUCTIONS - You are still able to use cellular telephones; use the ear opposite the side where you have your pacemaker/defibrillator.  Avoid carrying your cellular phone near your device. - When traveling through airports, show security personnel your identification card to avoid being screened in the metal detectors.  Ask the security personnel to use the hand wand. - Avoid arc welding equipment, MRI testing (magnetic resonance imaging), TENS units (transcutaneous nerve stimulators).  Call the office for questions about other devices. - Avoid electrical appliances that are in poor condition or are not properly grounded. - Microwave ovens are safe to be near or to operate.  ADDITIONAL INFORMATION FOR DEFIBRILLATOR PATIENTS SHOULD YOUR DEVICE GO OFF: - If your  device goes off ONCE and you feel fine afterward, notify the device clinic nurses. - If your device goes off ONCE and you do not feel well afterward, call 911. - If your device goes off TWICE, call 911. - If your device goes off [place], call 911.  DO NOT DRIVE YOURSELF OR A FAMILY MEMBER WITH A DEFIBRILLATOR TO THE HOSPITAL-CALL 911.

## 2018-02-27 NOTE — Progress Notes (Signed)
Electrophysiology Office Note   Date:  02/27/2018   ID:  Mark, Costa Dec 28, 1960, MRN [id number]  PCP:  Redmond School, MD  Cardiologist:  Oval Linsey Primary Electrophysiologist:  Rheannon Cerney Meredith Leeds, MD    Chief Complaint  Patient presents with  . Advice Only    Chronic combined systolic and diastolic HF     History of Present Illness: [name] is a 57 y.o. male who is being seen today for the evaluation of ischemic cardiomyopathy at the request of [name]. Presenting today for electrophysiology evaluation.  He has a history of coronary disease status post STEMI, chronic systolic and diastolic heart failure with an EF of 20 to 25%, peripheral arterial disease status post right femoral-tibial bypass followed by BKA, tobacco abuse, hepatitis C status post treatment.  He had an anterior STEMI on 05/26/2016 with an occluded LAD treated with drug-eluting stent.  Ejection fraction was 40% at that time with a troponin greater than 65.  Echo revealed an EF of 20 to 25%.  Coreg was increased.  Repeat echo showed an EF of 25 to 30%.  He does not have chest pain or shortness of breath.  He denies orthopnea and PND.  Both he and his wife are trying to quit smoking.    Today, he denies symptoms of palpitations, chest pain, shortness of breath, orthopnea, PND, lower extremity edema, claudication, dizziness, presyncope, syncope, bleeding, or neurologic sequela. The patient is tolerating medications without difficulties.    Past Medical History:  Diagnosis Date  . Anxiety   . Asthma   . Depression   . DVT (deep venous thrombosis) ([place]) 2009  . Hepatitis C    no prior treatment  -   did treatment 2015  . STEMI (ST elevation myocardial infarction) ([place]) 05/26/2016  . Tobacco abuse 01/28/2016   Past Surgical History:  Procedure Laterality Date  . AORTOGRAM  08/08/2009   for LLE claudication     By Dr. Oneida Alar  . 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 BIOPSY N/A 05/30/2014     Procedure: BIOPSY;  Surgeon: [name], MD;  Location: [facility];  Service: Endoscopy;  Laterality: N/A;  . BLADDER TUMOR EXCISION  8/812  . CARDIAC CATHETERIZATION N/A 05/26/2016   Procedure: Left Heart Cath and Coronary Angiography;  Surgeon: [name], MD;  Location: [facility];  Service: Cardiovascular;  Laterality: N/A;  . CARDIAC CATHETERIZATION N/A 05/26/2016   Procedure: Coronary Stent Intervention;  Surgeon: [name], MD;  Location: [facility];  Service: Cardiovascular;  Laterality: N/A;  . CHOLECYSTECTOMY    . COLONOSCOPY WITH PROPOFOL N/A 05/30/2014   VOZ:DGUYQI colonic polyp-likely source of hematochezia-removed as described above  . ESOPHAGOGASTRODUODENOSCOPY (EGD) WITH PROPOFOL N/A 05/30/2014   HKV:QQVZDG and bulbar erosions s/p gastric biopsy. No evidence of portal gastropathy on today's examination.  . FEMORAL-TIBIAL BYPASS GRAFT  2009   Right side using non-reversed GSV   By Dr. Oneida Alar  . FEMORAL-TIBIAL BYPASS GRAFT  11/24/2007   Right femoral to anterior tibial BPG   by Dr. Oneida Alar  . HAND TENDON SURGERY Left 2013   Left 5th finger  . HERNIA REPAIR    . INCISIONAL HERNIA REPAIR N/A 12/25/2014   Procedure: HERNIA REPAIR INCISIONAL WITH MESH;  Surgeon: [name], MD;  Location: [facility];  Service: General;  Laterality: N/A;  . INSERTION OF MESH N/A 12/25/2014   Procedure: INSERTION OF MESH;  Surgeon:  [name], MD;  Location: [facility];  Service: General;  Laterality: N/A;  . LOWER EXTREMITY ANGIOGRAM Left 12/30/2015   Procedure: Lower Extremity Angiogram;  Surgeon: [name], MD;  Location: [facility];  Service: Cardiovascular;  Laterality: Left;  . PERIPHERAL VASCULAR CATHETERIZATION N/A 12/30/2015   Procedure: Abdominal Aortogram;  Surgeon: [name], MD;  Location: [facility];  Service: Cardiovascular;  Laterality: N/A;  . POLYPECTOMY  05/30/2014   Procedure: POLYPECTOMY;  Surgeon: [name], MD;  Location: [facility];  Service: Endoscopy;;  . TONSILLECTOMY       Current Outpatient Medications  Medication Sig Dispense Refill  . alprazolam (XANAX) 2 MG tablet Take 2 mg by mouth 4 (four) times daily as needed for anxiety.     [name] amitriptyline (ELAVIL) 50 MG tablet Take 100 mg by mouth at bedtime.     [name] aspirin 81 MG EC tablet Take 81 mg by mouth daily.      [name] atorvastatin (LIPITOR) 80 MG tablet Take 1 tablet (80 mg total) by mouth daily at 6 PM. 30 tablet 12  . carvedilol (COREG) 6.25 MG tablet TAKE 1 TABLET BY MOUTH TWICE A DAY WITH A MEAL 60 tablet 6  . citalopram (CELEXA) 40 MG tablet Take 40 mg by mouth daily.  11  . HYDROcodone-acetaminophen (NORCO) 10-325 MG tablet Take 1 tablet by mouth every 4 (four) hours as needed.  0  . lisinopril (PRINIVIL,ZESTRIL) 2.5 MG tablet Take 1 tablet (2.5 mg total) by mouth daily. 30 tablet 12  . nicotine (NICODERM CQ - DOSED IN MG/24 HOURS) 14 mg/24hr patch Place 1 patch (14 mg total) onto the skin daily. 28 patch 0  . nitroGLYCERIN (NITROSTAT) 0.4 MG SL tablet Place 1 tablet (0.4 mg total) under the tongue every 5 (five) minutes x 3 doses as needed for chest pain. 25 tablet 2  . spironolactone (ALDACTONE) 25 MG tablet Take 1 tablet (25 mg total) by mouth daily. 30 tablet 0  . tamsulosin (FLOMAX) 0.4 MG CAPS capsule Take 1 capsule by mouth daily.     No current facility-administered medications for this visit.     Allergies:   Lyrica [pregabalin] and Neurontin [gabapentin]   Social History:  The patient  reports that he quit smoking about 21 months ago. His smoking use included cigarettes. He smoked 0.00 packs per day. He has never used smokeless tobacco. He reports that he has current or past drug history. Drug: Marijuana. He reports that he does not drink alcohol.   Family History:  The patient's family history includes Ulcers in his father; Vision loss in his mother.    ROS:  Please see the history of present illness.   Otherwise, review of systems is  positive for shortness of breath, wheezing.   All other systems are reviewed and negative.    PHYSICAL EXAM: VS:  BP 112/80   Pulse 71   Ht 6' (1.829 m)   Wt 237 lb (107.5 kg)   SpO2 94%   BMI 32.14 kg/m  , BMI Body mass index is 32.14 kg/m. GEN: Well nourished, well developed, in no acute distress  HEENT: normal  Neck: no JVD, carotid bruits, or masses Cardiac: RRR; no murmurs, rubs, or gallops,no edema  Respiratory: Slight wheezing on expiration  GI: soft, nontender, nondistended, + BS MS: no deformity or atrophy  Skin: warm and dry Neuro:  Strength and sensation are intact Psych: euthymic mood, full affect  EKG:  EKG is not ordered today. Personal review of the ekg ordered 01/31/2018 shows sinus rhythm, anteroseptal infarct  Recent Labs: 01/31/2018: ALT 30 02/17/2018: BUN 7; Creatinine, Ser 0.98; Potassium 4.6; Sodium 141    Lipid Panel     Component Value Date/Time   CHOL 93 (L) 01/31/2018 1124   TRIG 81 01/31/2018 1124   HDL 39 (L) 01/31/2018 1124   CHOLHDL 2.4 01/31/2018 1124   CHOLHDL 3.5 05/26/2016 0459   VLDL 35 05/26/2016 0459   LDLCALC 38 01/31/2018 1124     Wt Readings from Last 3 Encounters:  02/27/18 237 lb (107.5 kg)  02/02/18 242 lb (109.8 kg)  01/31/18 244 lb (110.7 kg)      Other studies Reviewed: Additional studies/ records that were reviewed today include: TTE 02/07/18 Review of the above records today demonstrates:  - Left ventricle: Poorly visualized. The cavity size was mildly   dilated. Systolic function was severely reduced. The estimated   ejection fraction was in the range of 20% to 25%. Severe diffuse   hypokinesis with distinct regional wall motion abnormalities.   There is akinesis of the apicalanterior, lateral, and apical   myocardium. Features are consistent with a pseudonormal left   ventricular filling pattern, with concomitant abnormal relaxation   and increased filling pressure (grade 2 diastolic dysfunction). - Left atrium:  The atrium was mildly dilated.  LHC 05/26/16:  A STENT XIENCE ALPINE RX 3.5X18 drug eluting stent was successfully placed, and does not overlap previously placed stent.  Prox LAD lesion, 100 %stenosed.  Post intervention, there is a 0% residual stenosis.  LV end diastolic pressure is mildly elevated.  There is mild to moderate left ventricular systolic dysfunction.  ASSESSMENT AND PLAN:  1.  Ischemic cardiomyopathy: Ejection fraction remains less than 35% despite optimal medical therapy.  I discussed with him ICD options.  Risks and benefits were discussed and include bleeding, tamponade, infection, pneumothorax.  The patient understands risks and is agreed to the procedure.  2.  Coronary artery disease status post anterior wall STEMI: Status post LAD drug-eluting stent.  Currently on aspirin.  Plavix has recently been stopped.  No current chest pain.  3.  Peripheral arterial disease: Currently on aspirin and statin.  Smoking cessation encouraged.  Has follow-up with vascular surgery.  4.  Tobacco abuse: Encouraged cessation.  He has tried nicotine replacement which has not worked.  I discussed with him risks of continued smoking including further cardiac and peripheral vascular complications.    Current medicines are reviewed at length with the patient today.   The patient does not have concerns regarding his medicines.  The following changes were made today:  none  Labs/ tests ordered today include:  No orders of the defined types were placed in this encounter.  Case discussed with primary cardiology  Disposition:   FU with Micha Erck 3 months  Signed, Kadie Balestrieri Meredith Leeds, MD  02/27/2018 2:11 PM     [facility] [address] [place] [address] [phone] (office) [phone] (fax)

## 2018-02-28 LAB — CBC
HEMOGLOBIN: 16.4 g/dL (ref 13.0–17.7)
Hematocrit: 47.6 % (ref 37.5–51.0)
MCH: 30.9 pg (ref 26.6–33.0)
MCHC: 34.5 g/dL (ref 31.5–35.7)
MCV: 90 fL (ref 79–97)
Platelets: 307 10*3/uL (ref 150–450)
RBC: 5.3 x10E6/uL (ref 4.14–5.80)
RDW: 14.5 % (ref 12.3–15.4)
WBC: 11.5 10*3/uL — ABNORMAL HIGH (ref 3.4–10.8)

## 2018-02-28 LAB — BASIC METABOLIC PANEL
BUN/Creatinine Ratio: 6 — ABNORMAL LOW (ref 9–20)
BUN: 7 mg/dL (ref 6–24)
CALCIUM: 9.7 mg/dL (ref 8.7–10.2)
CO2: 25 mmol/L (ref 20–29)
Chloride: 98 mmol/L (ref 96–106)
Creatinine, Ser: 1.09 mg/dL (ref 0.76–1.27)
GFR calc Af Amer: 88 mL/min/{1.73_m2} (ref >59)
GFR calc non Af Amer: 76 mL/min/{1.73_m2} (ref >59)
Glucose: 76 mg/dL (ref 65–99)
POTASSIUM: 4.8 mmol/L (ref 3.5–5.2)
Sodium: 139 mmol/L (ref 134–144)

## 2018-03-02 ENCOUNTER — Ambulatory Visit (HOSPITAL_COMMUNITY)
Admission: RE | Disposition: A | Discharge status: Home / Self Care | Payer: Self-pay | Source: Ambulatory Visit | Attending: Cardiology

## 2018-03-02 ENCOUNTER — Ambulatory Visit (HOSPITAL_COMMUNITY)
Admission: RE | Admit: 2018-03-02 | Discharge: 2018-03-03 | Disposition: A | Discharge status: Home / Self Care | Payer: Medicare Other | Source: Ambulatory Visit | Attending: Cardiology | Admitting: Cardiology

## 2018-03-02 ENCOUNTER — Other Ambulatory Visit: Payer: Self-pay

## 2018-03-02 ENCOUNTER — Encounter (HOSPITAL_COMMUNITY): Payer: Self-pay | Admitting: Cardiology

## 2018-03-02 DIAGNOSIS — Z7982 Long term (current) use of aspirin: Secondary | ICD-10-CM | POA: Diagnosis not present

## 2018-03-02 DIAGNOSIS — J45909 Unspecified asthma, uncomplicated: Secondary | ICD-10-CM | POA: Diagnosis not present

## 2018-03-02 DIAGNOSIS — I255 Ischemic cardiomyopathy: Secondary | ICD-10-CM | POA: Diagnosis present

## 2018-03-02 DIAGNOSIS — I252 Old myocardial infarction: Secondary | ICD-10-CM | POA: Diagnosis not present

## 2018-03-02 DIAGNOSIS — I700 Atherosclerosis of aorta: Secondary | ICD-10-CM | POA: Insufficient documentation

## 2018-03-02 DIAGNOSIS — I2510 Atherosclerotic heart disease of native coronary artery without angina pectoris: Secondary | ICD-10-CM | POA: Diagnosis not present

## 2018-03-02 DIAGNOSIS — I5020 Unspecified systolic (congestive) heart failure: Secondary | ICD-10-CM | POA: Diagnosis not present

## 2018-03-02 DIAGNOSIS — Z95818 Presence of other cardiac implants and grafts: Secondary | ICD-10-CM

## 2018-03-02 DIAGNOSIS — Z87891 Personal history of nicotine dependence: Secondary | ICD-10-CM | POA: Insufficient documentation

## 2018-03-02 DIAGNOSIS — I739 Peripheral vascular disease, unspecified: Secondary | ICD-10-CM | POA: Diagnosis not present

## 2018-03-02 DIAGNOSIS — B182 Chronic viral hepatitis C: Secondary | ICD-10-CM | POA: Insufficient documentation

## 2018-03-02 DIAGNOSIS — I493 Ventricular premature depolarization: Secondary | ICD-10-CM | POA: Insufficient documentation

## 2018-03-02 DIAGNOSIS — Z95810 Presence of automatic (implantable) cardiac defibrillator: Secondary | ICD-10-CM

## 2018-03-02 HISTORY — DX: Presence of automatic (implantable) cardiac defibrillator: Z95.810

## 2018-03-02 HISTORY — DX: Ischemic cardiomyopathy: I25.5

## 2018-03-02 HISTORY — DX: Post-void dribbling: N39.43

## 2018-03-02 HISTORY — PX: ICD IMPLANT: EP1208

## 2018-03-02 HISTORY — DX: Pure hypercholesterolemia, unspecified: E78.00

## 2018-03-02 LAB — SURGICAL PCR SCREEN
MRSA, PCR: NEGATIVE
STAPHYLOCOCCUS AUREUS: NEGATIVE

## 2018-03-02 SURGERY — ICD IMPLANT

## 2018-03-02 MED ORDER — LIDOCAINE HCL (PF) 1 % IJ SOLN
INTRAMUSCULAR | Status: DC | PRN
  Administered 2018-03-02: 80 mL

## 2018-03-02 MED ORDER — HYDROCODONE-ACETAMINOPHEN 10-325 MG PO TABS
1.0000 | ORAL_TABLET | ORAL | Status: DC | PRN
  Administered 2018-03-02 – 2018-03-03 (×4): 1 via ORAL
  Filled 2018-03-02 (×4): qty 1

## 2018-03-02 MED ORDER — CEFAZOLIN SODIUM-DEXTROSE 2-4 GM/100ML-% IV SOLN
INTRAVENOUS | Status: AC
  Filled 2018-03-02: qty 100

## 2018-03-02 MED ORDER — MIDAZOLAM HCL 5 MG/5ML IJ SOLN
INTRAMUSCULAR | Status: AC
  Filled 2018-03-02: qty 5

## 2018-03-02 MED ORDER — ASPIRIN EC 81 MG PO TBEC
81.0000 mg | DELAYED_RELEASE_TABLET | Freq: Every day | ORAL | Status: DC
  Administered 2018-03-02 – 2018-03-03 (×2): 81 mg via ORAL
  Filled 2018-03-02 (×2): qty 1

## 2018-03-02 MED ORDER — SPIRONOLACTONE 25 MG PO TABS
25.0000 mg | ORAL_TABLET | Freq: Every day | ORAL | Status: DC
  Administered 2018-03-02 – 2018-03-03 (×2): 25 mg via ORAL
  Filled 2018-03-02 (×2): qty 1

## 2018-03-02 MED ORDER — LIDOCAINE HCL 1 % IJ SOLN
INTRAMUSCULAR | Status: AC
  Filled 2018-03-02: qty 20

## 2018-03-02 MED ORDER — LISINOPRIL 2.5 MG PO TABS
2.5000 mg | ORAL_TABLET | Freq: Every day | ORAL | Status: DC
  Administered 2018-03-02 – 2018-03-03 (×2): 2.5 mg via ORAL
  Filled 2018-03-02 (×2): qty 1

## 2018-03-02 MED ORDER — SODIUM CHLORIDE 0.9 % IV SOLN
INTRAVENOUS | Status: DC
  Administered 2018-03-02: 07:00:00 via INTRAVENOUS

## 2018-03-02 MED ORDER — ONDANSETRON HCL 4 MG/2ML IJ SOLN: 4.0000 mg | Freq: Four times a day (QID) | INTRAMUSCULAR | Status: DC | PRN

## 2018-03-02 MED ORDER — NITROGLYCERIN 0.4 MG SL SUBL: 0.4000 mg | SUBLINGUAL_TABLET | SUBLINGUAL | Status: DC | PRN

## 2018-03-02 MED ORDER — ALPRAZOLAM 0.5 MG PO TABS
2.0000 mg | ORAL_TABLET | Freq: Four times a day (QID) | ORAL | Status: DC | PRN
  Administered 2018-03-02 – 2018-03-03 (×4): 2 mg via ORAL
  Filled 2018-03-02 (×4): qty 4

## 2018-03-02 MED ORDER — MUPIROCIN 2 % EX OINT
1.0000 "application " | TOPICAL_OINTMENT | Freq: Once | CUTANEOUS | Status: AC
  Administered 2018-03-02: 1 via TOPICAL
  Filled 2018-03-02: qty 22

## 2018-03-02 MED ORDER — ACETAMINOPHEN 325 MG PO TABS: 325.0000 mg | ORAL_TABLET | ORAL | Status: DC | PRN

## 2018-03-02 MED ORDER — NICOTINE 21 MG/24HR TD PT24
21.0000 mg | MEDICATED_PATCH | Freq: Every day | TRANSDERMAL | Status: DC
  Administered 2018-03-02 – 2018-03-03 (×2): 21 mg via TRANSDERMAL
  Filled 2018-03-02 (×2): qty 1

## 2018-03-02 MED ORDER — CEFAZOLIN SODIUM-DEXTROSE 2-4 GM/100ML-% IV SOLN
2.0000 g | INTRAVENOUS | Status: AC
  Administered 2018-03-02: 2 g via INTRAVENOUS
  Filled 2018-03-02: qty 100

## 2018-03-02 MED ORDER — CARVEDILOL 6.25 MG PO TABS
6.2500 mg | ORAL_TABLET | Freq: Two times a day (BID) | ORAL | Status: DC
  Administered 2018-03-02 – 2018-03-03 (×2): 6.25 mg via ORAL
  Filled 2018-03-02 (×3): qty 1

## 2018-03-02 MED ORDER — CEFAZOLIN SODIUM-DEXTROSE 1-4 GM/50ML-% IV SOLN
1.0000 g | Freq: Four times a day (QID) | INTRAVENOUS | Status: AC
  Administered 2018-03-02 – 2018-03-03 (×3): 1 g via INTRAVENOUS
  Filled 2018-03-02 (×3): qty 50

## 2018-03-02 MED ORDER — GENTAMICIN SULFATE 40 MG/ML IJ SOLN
INTRAMUSCULAR | Status: AC
  Filled 2018-03-02: qty 2

## 2018-03-02 MED ORDER — FENTANYL CITRATE (PF) 100 MCG/2ML IJ SOLN
INTRAMUSCULAR | Status: DC | PRN
  Administered 2018-03-02: 12.5 ug via INTRAVENOUS
  Administered 2018-03-02 (×4): 25 ug via INTRAVENOUS
  Administered 2018-03-02: 12.5 ug via INTRAVENOUS

## 2018-03-02 MED ORDER — SODIUM CHLORIDE 0.9 % IV SOLN
INTRAVENOUS | Status: AC
  Filled 2018-03-02: qty 2

## 2018-03-02 MED ORDER — FENTANYL CITRATE (PF) 100 MCG/2ML IJ SOLN
INTRAMUSCULAR | Status: AC
  Filled 2018-03-02: qty 2

## 2018-03-02 MED ORDER — HEPARIN (PORCINE) IN NACL 2-0.9 UNITS/ML
INTRAMUSCULAR | Status: AC | PRN
  Administered 2018-03-02: 500 mL

## 2018-03-02 MED ORDER — SODIUM CHLORIDE 0.9 % IV SOLN
80.0000 mg | INTRAVENOUS | Status: AC
  Administered 2018-03-02: 80 mg
  Filled 2018-03-02: qty 2

## 2018-03-02 MED ORDER — MIDAZOLAM HCL 5 MG/5ML IJ SOLN
INTRAMUSCULAR | Status: DC | PRN
  Administered 2018-03-02 (×4): 1 mg via INTRAVENOUS
  Administered 2018-03-02: 2 mg via INTRAVENOUS
  Administered 2018-03-02: 1 mg via INTRAVENOUS

## 2018-03-02 MED ORDER — HEPARIN (PORCINE) IN NACL 1000-0.9 UT/500ML-% IV SOLN
INTRAVENOUS | Status: AC
  Filled 2018-03-02: qty 500

## 2018-03-02 MED ORDER — MUPIROCIN 2 % EX OINT
TOPICAL_OINTMENT | CUTANEOUS | Status: AC
  Administered 2018-03-02: 1 via TOPICAL
  Filled 2018-03-02: qty 22

## 2018-03-02 MED ORDER — LIDOCAINE HCL 1 % IJ SOLN
INTRAMUSCULAR | Status: AC
  Filled 2018-03-02: qty 60

## 2018-03-02 MED ORDER — AMITRIPTYLINE HCL 25 MG PO TABS
100.0000 mg | ORAL_TABLET | Freq: Every day | ORAL | Status: DC
  Administered 2018-03-02: 100 mg via ORAL
  Filled 2018-03-02: qty 4

## 2018-03-02 MED ORDER — CITALOPRAM HYDROBROMIDE 20 MG PO TABS
40.0000 mg | ORAL_TABLET | Freq: Every day | ORAL | Status: DC
  Administered 2018-03-02 – 2018-03-03 (×2): 40 mg via ORAL
  Filled 2018-03-02 (×2): qty 2

## 2018-03-02 MED ORDER — ATORVASTATIN CALCIUM 80 MG PO TABS
80.0000 mg | ORAL_TABLET | Freq: Every day | ORAL | Status: DC
  Administered 2018-03-02: 80 mg via ORAL
  Filled 2018-03-02: qty 1

## 2018-03-02 MED ORDER — TAMSULOSIN HCL 0.4 MG PO CAPS
0.4000 mg | ORAL_CAPSULE | Freq: Every day | ORAL | Status: DC
  Administered 2018-03-02 – 2018-03-03 (×2): 0.4 mg via ORAL
  Filled 2018-03-02 (×2): qty 1

## 2018-03-02 SURGICAL SUPPLY — 7 items
CABLE SURGICAL S-101-97-12 (CABLE) ×1 IMPLANT
ICD VISIA MRI VR DVFB1D4 (ICD Generator) IMPLANT
LEAD SPRINT QUAT SEC 6935M-62 (Lead) ×1 IMPLANT
PAD DEFIB LIFELINK (PAD) ×1 IMPLANT
SHEATH CLASSIC 9F (SHEATH) ×1 IMPLANT
TRAY PACEMAKER INSERTION (PACKS) ×1 IMPLANT
VISIA MRI VR DVFB1D4 (ICD Generator) ×2 IMPLANT

## 2018-03-02 NOTE — H&P (Signed)
[name] has presented today for surgery, with the diagnosis of systolic heart failure.  The various methods of treatment have been discussed with the patient and family. After consideration of risks, benefits and other options for treatment, the patient has consented to  Procedure(s): ICD implant as a surgical intervention .  Risks include but not limited to bleeding, tamponade, infection, pneumothorax, among others. The patient's history has been reviewed, patient examined, no change in status, stable for surgery.  I have reviewed the patient's chart and labs.  Questions were answered to the patient's satisfaction.    Araya Roel Curt Bears, MD 03/02/2018 7:23 AM  ICD Criteria  Current LVEF:20-25%. Within 12 months prior to implant: Yes   Heart failure history: Yes, Class II  Cardiomyopathy history: Yes, Ischemic Cardiomyopathy - Prior MI.  Atrial Fibrillation/Atrial Flutter: No.  Ventricular tachycardia history: No.  Cardiac arrest history: No.  History of syndromes with risk of sudden death: No.  Previous ICD: No.  Current ICD indication: Primary  PPM indication: No.   Class I or II Bradycardia indication present: No  Beta Blocker therapy for 3 or more months: Yes, prescribed.   Ace Inhibitor/ARB therapy for 3 or more months: Yes, prescribed.

## 2018-03-03 ENCOUNTER — Ambulatory Visit (HOSPITAL_COMMUNITY): Payer: Medicare Other

## 2018-03-03 DIAGNOSIS — I252 Old myocardial infarction: Secondary | ICD-10-CM | POA: Diagnosis not present

## 2018-03-03 DIAGNOSIS — I700 Atherosclerosis of aorta: Secondary | ICD-10-CM | POA: Diagnosis not present

## 2018-03-03 DIAGNOSIS — I493 Ventricular premature depolarization: Secondary | ICD-10-CM | POA: Diagnosis not present

## 2018-03-03 DIAGNOSIS — Z7982 Long term (current) use of aspirin: Secondary | ICD-10-CM | POA: Diagnosis not present

## 2018-03-03 DIAGNOSIS — I739 Peripheral vascular disease, unspecified: Secondary | ICD-10-CM | POA: Diagnosis not present

## 2018-03-03 DIAGNOSIS — I5020 Unspecified systolic (congestive) heart failure: Secondary | ICD-10-CM | POA: Diagnosis not present

## 2018-03-03 DIAGNOSIS — Z87891 Personal history of nicotine dependence: Secondary | ICD-10-CM | POA: Diagnosis not present

## 2018-03-03 DIAGNOSIS — I255 Ischemic cardiomyopathy: Secondary | ICD-10-CM | POA: Diagnosis not present

## 2018-03-03 DIAGNOSIS — J45909 Unspecified asthma, uncomplicated: Secondary | ICD-10-CM | POA: Diagnosis not present

## 2018-03-03 DIAGNOSIS — Z950 Presence of cardiac pacemaker: Secondary | ICD-10-CM | POA: Diagnosis not present

## 2018-03-03 DIAGNOSIS — I2510 Atherosclerotic heart disease of native coronary artery without angina pectoris: Secondary | ICD-10-CM | POA: Diagnosis not present

## 2018-03-03 DIAGNOSIS — R918 Other nonspecific abnormal finding of lung field: Secondary | ICD-10-CM | POA: Diagnosis not present

## 2018-03-03 MED ORDER — IOHEXOL 300 MG/ML  SOLN
100.0000 mL | Freq: Once | INTRAMUSCULAR | Status: AC | PRN
  Administered 2018-03-03: 100 mL via INTRAVENOUS

## 2018-03-03 NOTE — Discharge Summary (Addendum)
ELECTROPHYSIOLOGY PROCEDURE DISCHARGE SUMMARY    Patient ID: [name],  MRN: [id number], DOB/AGE: 02-07-1961 57 y.o.  Admit date: 03/02/2018 Discharge date: 03/03/2018  Primary Care Physician: Redmond School, MD Primary Cardiologist: Oval Linsey Electrophysiologist: Curt Bears  Primary Discharge Diagnosis:  ICM s/p ICD implant this admission  Secondary Discharge Diagnosis:  1.  CAD 2.  PAD 3.  Tobacco abuse  Allergies  Allergen Reactions  . Lyrica [Pregabalin] Palpitations    Chest pain and heart palps.  . Neurontin [Gabapentin] Palpitations    Chest pain and heart palp     Procedures This Admission:  1.  Implantation of a MDT single chamber ICD on 03/02/18 by Dr Curt Bears.  The patient received a MDT model number Visia AF ICD with model number 7672 right ventricular lead..  DFT's were deferred at time of implant.  There were no immediate post procedure complications. 2.  CXR on 03/03/18 demonstrated no pneumothorax status post device implantation. Large mass right upper lobe measuring 8cmx6cm. 3.  Chest CT on 03/03/18 demonstrated 7.9 x 7.1 x 6.5 cm right upper lobe mass with evidence of probable direct chest wall invasion as well as right hilar and potentially right paratracheal lymphadenopathy. Further evaluation with nonemergent PET-CT is recommended in the near future for staging purposes.  Brief HPI: [name] is a 57 y.o. male was referred to electrophysiology in the outpatient setting for consideration of ICD implantation.  Past medical history includes ICM.  The patient has persistent LV dysfunction despite guideline directed therapy.  Risks, benefits, and alternatives to ICD implantation were reviewed with the patient who wished to proceed.   Hospital Course:  The patient was admitted and underwent implantation of a MDT single chamber ICD with details as outlined above. He was monitored on telemetry overnight which demonstrated SR with PVC's.  Left chest was without  hematoma or ecchymosis.  The device was interrogated and found to be functioning normally.  CXR was obtained and demonstrated no pneumothorax status post device implantation.  Wound care, arm mobility, and restrictions were reviewed with the patient.  The patient was examined and considered stable for discharge to home.   The patient's discharge medications include an ACE-I (Lisinopril) and beta blocker (Coreg).   CXR demonstrated large mass in right upper lobe.  CT scan was recommended by radiology and demonstrated 7.9 x 7.1 x 6.5 cm right upper lobe mass with evidence of probable direct chest wall invasion as well as right hilar and potentially right paratracheal lymphadenopathy. Further evaluation with nonemergent PET-CT is recommended in the near future for staging purposes.  Discussed with pulmonary, they have arranged to see him next week for further evaluation.   Physical Exam: Vitals:   03/03/18 0453 03/03/18 0500 03/03/18 0907 03/03/18 1411  BP: (!) 145/96  112/78   Pulse: 79  64   Resp: 17  18   Temp: 97.6 F (36.4 C)   98.4 F (36.9 C)  TempSrc: Oral   Oral  SpO2: 98%  98%   Weight:  236 lb (107 kg)    Height:        GEN- The patient is obese appearing, alert and oriented x 3 today.   HEENT: normocephalic, atraumatic; sclera clear, conjunctiva pink; hearing intact; oropharynx clear; neck supple  Lungs- Clear to ausculation bilaterally, normal work of breathing.  No wheezes, rales, rhonchi Heart- Regular rate and rhythm   GI- soft, non-tender, non-distended, bowel sounds present Extremities- no clubbing, cyanosis, or edema  MS-  no significant deformity or atrophy Skin- warm and dry, no rash or lesion, left chest without hematoma/ecchymosis Psych- euthymic mood, full affect Neuro- strength and sensation are intact   Labs:   Lab Results  Component Value Date   WBC 11.5 (H) 02/27/2018   HGB 16.4 02/27/2018   HCT 47.6 02/27/2018   MCV 90 02/27/2018   PLT 307 02/27/2018     Recent Labs  Lab 02/27/18 1457  NA 139  K 4.8  CL 98  CO2 25  BUN 7  CREATININE 1.09  CALCIUM 9.7  GLUCOSE 76    Discharge Medications:  Allergies as of 03/03/2018      Reactions   Lyrica [pregabalin] Palpitations   Chest pain and heart palps.   Neurontin [gabapentin] Palpitations   Chest pain and heart palp      Medication List    TAKE these medications   alprazolam 2 MG tablet Commonly known as:  XANAX Take 2 mg by mouth 4 (four) times daily as needed for anxiety.   amitriptyline 50 MG tablet Commonly known as:  ELAVIL Take 100 mg by mouth at bedtime.   aspirin 81 MG EC tablet Take 81 mg by mouth daily.   atorvastatin 80 MG tablet Commonly known as:  LIPITOR Take 1 tablet (80 mg total) by mouth daily at 6 PM.   carvedilol 6.25 MG tablet Commonly known as:  COREG TAKE 1 TABLET BY MOUTH TWICE A DAY WITH A MEAL   citalopram 40 MG tablet Commonly known as:  CELEXA Take 40 mg by mouth daily.   HYDROcodone-acetaminophen 10-325 MG tablet Commonly known as:  NORCO Take 1 tablet by mouth every 4 (four) hours as needed for moderate pain.   lisinopril 2.5 MG tablet Commonly known as:  PRINIVIL,ZESTRIL Take 1 tablet (2.5 mg total) by mouth daily.   nicotine 21 mg/24hr patch Commonly known as:  NICODERM CQ - dosed in mg/24 hours Place 21 mg onto the skin daily.   nitroGLYCERIN 0.4 MG SL tablet Commonly known as:  NITROSTAT Place 1 tablet (0.4 mg total) under the tongue every 5 (five) minutes x 3 doses as needed for chest pain.   spironolactone 25 MG tablet Commonly known as:  ALDACTONE Take 1 tablet (25 mg total) by mouth daily.   tamsulosin 0.4 MG Caps capsule Commonly known as:  FLOMAX Take 1 capsule by mouth daily.       Disposition:  Discharge Instructions    Diet - low sodium heart healthy   Complete by:  As directed    Increase activity slowly   Complete by:  As directed      Follow-up Information    [place] [facility] [facility]  Follow up on 03/15/2018.   Specialty:  Cardiology Why:  at 11:30AM Contact information: [address], [address] [place]       [name], MD Follow up on 06/06/2018.   Specialty:  Cardiology Why:  at 11:45AM Contact information: [address] [place] [address] [phone]        [name], MD Follow up on 03/08/2018.   Specialty:  Pulmonary Disease Why:  at 9AM Contact information: [address] [address] [phone]           Duration of Discharge Encounter: Greater than 30 minutes including physician time.  Signed, Chanetta Marshall, NP 03/03/2018 3:56 PM   I have seen and examined this patient with [employer]  Lynnell Jude.  Agree with above, note added to reflect my findings.  On exam, RRR, no murmurs, lungs clear.   Medtronic CRT-D implanted for primary prevention.  Device interrogation without major abnormalities.  Chest x-ray shows stable lead position.  On chest x-ray was noted that he had a an 8 x 6 cm mass.  CT scan confirms the mass.  He has follow-up arranged with pulmonary for further work-up.  Plan for discharge today with follow-up in device clinic.  Jakyah Bradby M. Adama Ferber MD 03/03/2018 5:34 PM

## 2018-03-03 NOTE — Discharge Instructions (Signed)
° ° °  Supplemental Discharge Instructions for  Pacemaker/Defibrillator Patients  Activity No heavy lifting or vigorous activity with your left/right arm for 6 to 8 weeks.  Do not raise your left/right arm above your head for one week.  Gradually raise your affected arm as drawn below.           __         03/07/18                    03/08/18                    03/09/18                   03/10/18  NO DRIVING for  1 week   ; you may begin driving on  1/44/81   .  WOUND CARE - Keep the wound area clean and dry.  Do not get this area wet for one week. No showers for one week; you may shower on   03/10/18  . - The tape/steri-strips on your wound will fall off; do not pull them off.  No bandage is needed on the site.  DO  NOT apply any creams, oils, or ointments to the wound area. - If you notice any drainage or discharge from the wound, any swelling or bruising at the site, or you develop a fever > 101? F after you are discharged home, call the office at once.  Special Instructions - You are still able to use cellular telephones; use the ear opposite the side where you have your pacemaker/defibrillator.  Avoid carrying your cellular phone near your device. - When traveling through airports, show security personnel your identification card to avoid being screened in the metal detectors.  Ask the security personnel to use the hand wand. - Avoid arc welding equipment, MRI testing (magnetic resonance imaging), TENS units (transcutaneous nerve stimulators).  Call the office for questions about other devices. - Avoid electrical appliances that are in poor condition or are not properly grounded. - Microwave ovens are safe to be near or to operate.  Additional information for defibrillator patients should your device go off: - If your device goes off ONCE and you feel fine afterward, notify the device clinic nurses. - If your device goes off ONCE and you do not feel well afterward, call 911. - If your  device goes off TWICE, call 911. - If your device goes off THREE times in one day, call 911.  DO NOT DRIVE YOURSELF OR A FAMILY MEMBER WITH A DEFIBRILLATOR TO THE HOSPITAL--CALL 911.

## 2018-03-03 NOTE — Care Management Note (Signed)
Case Management Note  Patient Details  Name: [name] MRN: [id number] Date of Birth: 1961/05/11  Subjective/Objective:                    Action/Plan: Pt discharging home with self care. Pt has hospital f/u, insurance and transportation home.   Expected Discharge Date:  03/03/18               Expected Discharge Plan:  Home/Self Care  In-House Referral:     Discharge planning Services     Post Acute Care Choice:    Choice offered to:     DME Arranged:    DME Agency:     HH Arranged:    HH Agency:     Status of Service:  Completed, signed off  If discussed at [employer] of Stay Meetings, dates discussed:    Additional Comments:  Pollie Friar, RN 03/03/2018, 4:19 PM

## 2018-03-03 NOTE — Progress Notes (Signed)
Spoke with radiologist concerning this mornings CXR. Paged NP on call. Will cont to monitor pt.

## 2018-03-03 NOTE — Progress Notes (Signed)
Discharge instructions reviewed with pt. Pt has no questions at this time. Pt's inscion site is clean and dry covered with steri strips. IV d/c. Pt states he is ready to go.

## 2018-03-06 MED FILL — Lidocaine HCl Local Inj 1%: INTRAMUSCULAR | Qty: 80 | Status: AC

## 2018-03-08 ENCOUNTER — Encounter: Payer: Self-pay | Admitting: Pulmonary Disease

## 2018-03-08 ENCOUNTER — Ambulatory Visit: Payer: Medicare Other | Admitting: Pulmonary Disease

## 2018-03-08 VITALS — BP 128/82 | HR 82 | Ht 73.0 in | Wt 237.0 lb

## 2018-03-08 DIAGNOSIS — R918 Other nonspecific abnormal finding of lung field: Secondary | ICD-10-CM | POA: Diagnosis not present

## 2018-03-08 NOTE — Assessment & Plan Note (Addendum)
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  There also seems to be hilar lymph node enlargement. We will obtain PET scan to clarify.  May also need MRI to clarify chest wall involvement.  He does not seem to have any symptoms of neurovascular involvement in his right arm.  We will obtain PFTs as baseline to evaluate for resectability. I anticipate that he will need a CT-guided needle biopsy, he does have a bronchus leading to this mass.  The various options of biopsy including bronchoscopy, CT guided needle aspiration and surgical biopsy were discussed.The risks of each procedure including coughing, bleeding and the  chances of lung puncture requiring chest tube were discussed in great detail. The benefits & alternatives including serial follow up were also discussed.  We will refer him to multidisciplinary clinic early for surgical evaluation   I discussed gravity of situation with him and his wife [name], they seem to be more worried about co-pays although they seem to understand the implication of this being lung cancer.  I emphasized to them the importance of keeping about appointments  Greater than 50% time was spent in counseling and coordination of care with the patient  x82mins

## 2018-03-08 NOTE — Addendum Note (Signed)
Addended by: Valerie Salts on: 03/08/2018 09:48 AM   Modules accepted: Orders

## 2018-03-08 NOTE — Addendum Note (Signed)
Addended by: Valerie Salts on: 03/08/2018 10:28 AM   Modules accepted: Orders

## 2018-03-08 NOTE — Patient Instructions (Signed)
Schedule PET scan. Schedule PFTs.  Based on this we will proceed with needle biopsy of lung mass
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
## 2018-03-10 ENCOUNTER — Other Ambulatory Visit: Payer: Self-pay | Admitting: Cardiovascular Disease

## 2018-03-10 ENCOUNTER — Encounter (HOSPITAL_COMMUNITY)
Admission: RE | Admit: 2018-03-10 | Discharge: 2018-03-10 | Disposition: A | Discharge status: Home / Self Care | Payer: Medicare Other | Source: Ambulatory Visit | Attending: Pulmonary Disease | Admitting: Pulmonary Disease

## 2018-03-10 DIAGNOSIS — R918 Other nonspecific abnormal finding of lung field: Secondary | ICD-10-CM | POA: Insufficient documentation

## 2018-03-10 DIAGNOSIS — C3411 Malignant neoplasm of upper lobe, right bronchus or lung: Secondary | ICD-10-CM | POA: Diagnosis not present

## 2018-03-10 LAB — GLUCOSE, CAPILLARY: GLUCOSE-CAPILLARY: 105 mg/dL — AB (ref 65–99)

## 2018-03-10 MED ORDER — FLUDEOXYGLUCOSE F - 18 (FDG) INJECTION
11.8000 | Freq: Once | INTRAVENOUS | Status: AC
  Administered 2018-03-10: 11.8 via INTRAVENOUS

## 2018-03-13 ENCOUNTER — Telehealth: Payer: Self-pay | Admitting: *Deleted

## 2018-03-13 ENCOUNTER — Ambulatory Visit (INDEPENDENT_AMBULATORY_CARE_PROVIDER_SITE_OTHER): Payer: Medicare Other | Admitting: Pulmonary Disease

## 2018-03-13 ENCOUNTER — Encounter: Payer: Self-pay | Admitting: Pulmonary Disease

## 2018-03-13 ENCOUNTER — Other Ambulatory Visit: Payer: Self-pay

## 2018-03-13 DIAGNOSIS — Z720 Tobacco use: Secondary | ICD-10-CM

## 2018-03-13 DIAGNOSIS — R918 Other nonspecific abnormal finding of lung field: Secondary | ICD-10-CM | POA: Diagnosis not present

## 2018-03-13 DIAGNOSIS — J4489 Other specified chronic obstructive pulmonary disease: Secondary | ICD-10-CM

## 2018-03-13 DIAGNOSIS — J449 Chronic obstructive pulmonary disease, unspecified: Secondary | ICD-10-CM

## 2018-03-13 HISTORY — DX: Chronic obstructive pulmonary disease, unspecified: J44.9

## 2018-03-13 HISTORY — DX: Other specified chronic obstructive pulmonary disease: J44.89

## 2018-03-13 LAB — PULMONARY FUNCTION TEST
DL/VA % PRED: 69 %
DL/VA: 3.27 ml/min/mmHg/L
DLCO unc % pred: 62 %
DLCO unc: 21.11 ml/min/mmHg
FEF 25-75 POST: 1.55 L/s
FEF 25-75 Pre: 1.12 L/sec
FEF2575-%Change-Post: 38 %
FEF2575-%PRED-POST: 46 %
FEF2575-%PRED-PRE: 33 %
FEV1-%CHANGE-POST: 11 %
FEV1-%PRED-PRE: 51 %
FEV1-%Pred-Post: 57 %
FEV1-POST: 2.25 L
FEV1-PRE: 2.02 L
FEV1FVC-%CHANGE-POST: 1 %
FEV1FVC-%PRED-PRE: 73 %
FEV6-%Change-Post: 9 %
FEV6-%Pred-Post: 78 %
FEV6-%Pred-Pre: 71 %
FEV6-Post: 3.82 L
FEV6-Pre: 3.49 L
FEV6FVC-%Change-Post: 0 %
FEV6FVC-%PRED-POST: 102 %
FEV6FVC-%Pred-Pre: 102 %
FVC-%Change-Post: 9 %
FVC-%PRED-PRE: 69 %
FVC-%Pred-Post: 76 %
FVC-PRE: 3.54 L
FVC-Post: 3.89 L
POST FEV1/FVC RATIO: 58 %
Post FEV6/FVC ratio: 98 %
Pre FEV1/FVC ratio: 57 %
Pre FEV6/FVC Ratio: 99 %

## 2018-03-13 NOTE — Progress Notes (Signed)
PFT completed 03/13/18  

## 2018-03-13 NOTE — Assessment & Plan Note (Signed)
We discussed PET scan results. Biopsy has been scheduled for next week. This appears to be a [place] tumor with chest wall invasion -we will expedite multidisciplinary thoracic oncology appointment, he may need MRI to further assess involvement of chest wall and neurovascular bundle although he has no clinical evidence of involvement of nerve or blood vessel  We will likely need neoadjuvant chemoradiation and then reassess staging

## 2018-03-13 NOTE — Progress Notes (Signed)
   Subjective:    Patient ID: [name], male    DOB: 1961/07/15, 57 y.o.   MRN: [id number]  HPI 57 year old smoker with Rt BKA  for FU of right upper lobe lung mass incidentally noted.  He has chronic systolic heart failure related to STEMI in 04/2016 that required a proximal LAD stent.  Echo 01/2018 showed EF to be persistently low at 25% and hence AICD was placed on 03/02/2018  PET scan was reviewed today, right upper lobe mass lighted up as stated right hilar node. Incidentally right cervical lymph node level 2 and low-grade metabolism and is considered reactive.  Left parotid 6 mm nodule was hypermetabolic. We also reviewed PFTs today.  He is active and is able to mow his yard with a riding mower.  Does not feel the need for breathing medicines currently  Notes considerable anxiety about upcoming biopsy  Significant tests/ events reviewed   CT chest with IV contrast 03/03/2018 showed large macrolobulated mass measuring 7.9 x 7.1 x 6.5 cm abutting the pleural surface with suggestion of pleural invasion and potentially even chest wall invasion.  There was a nodule in the posterior aspect of the left upper lobe measuring 10 x4 mm which was favored to be a subpleural lymph node and a small calcified granuloma in the left lower lobe.  Mild emphysema was also noted.  PFT 02/2018 showed FEV1 of 51%, improved with bronchodilator to 57%, ratio 57, DLCO 62%   Review of Systems neg for any significant sore throat, dysphagia, itching, sneezing, nasal congestion or excess/ purulent secretions, fever, chills, sweats, unintended wt loss, pleuritic or exertional cp, hempoptysis, orthopnea pnd or change in chronic leg swelling. Also denies presyncope, palpitations, heartburn, abdominal pain, nausea, vomiting, diarrhea or change in bowel or urinary habits, dysuria,hematuria, rash, arthralgias, visual complaints, headache, numbness weakness or ataxia.     Objective:   Physical Exam   Gen. Pleasant,  well-nourished, in no distress ENT - no thrush, no post nasal drip Neck: No JVD, no thyromegaly, no carotid bruits Lungs: no use of accessory muscles, no dullness to percussion, clear without rales or rhonchi  Cardiovascular: Rhythm regular, heart sounds  normal, no murmurs or gallops, no peripheral edema Musculoskeletal: No deformities, no cyanosis or clubbing , no RUE weakness        Assessment & Plan:

## 2018-03-13 NOTE — Patient Instructions (Signed)
We discussed PET scan results. Biopsy has been scheduled for next week.  Lung function is at 57% We will go ahead and make appointment with cancer specialist

## 2018-03-13 NOTE — Assessment & Plan Note (Signed)
Smoking cessation again emphasized

## 2018-03-13 NOTE — Telephone Encounter (Signed)
Oncology Nurse Navigator Documentation  Oncology Nurse Navigator Flowsheets 03/13/2018  Navigator Location [facility]  Navigator Encounter Type Telephone/I received referral on [name]. I called to schedule him for [facility].  I was unable to reach but did leave a vm message for him to call me.   Telephone Outgoing Call  Treatment Phase Pre-Tx/Tx Discussion  Barriers/Navigation Needs Coordination of Care  Interventions Coordination of Care  Acuity Level 1  Time Spent with Patient 15

## 2018-03-13 NOTE — Assessment & Plan Note (Signed)
Does not want maintenance medication at this time. We will watch him for his symptoms He will call us if he develops sputum production or wheezing

## 2018-03-14 ENCOUNTER — Telehealth: Payer: Self-pay | Admitting: *Deleted

## 2018-03-14 DIAGNOSIS — R918 Other nonspecific abnormal finding of lung field: Secondary | ICD-10-CM

## 2018-03-14 NOTE — Telephone Encounter (Signed)
Oncology Nurse Navigator Documentation  Oncology Nurse Navigator Flowsheets 03/14/2018  Navigator Location [facility]  Navigator Encounter Type Telephone/I called and updated on appt.  They are unable to make appt an appt this week.  I gave them anppt on June 26th.  Verbalized understanding of appt time and place.   Telephone Outgoing Call  Treatment Phase Pre-Tx/Tx Discussion  Barriers/Navigation Needs Coordination of Care  Interventions Coordination of Care  Acuity Level 2  Time Spent with Patient 30

## 2018-03-15 ENCOUNTER — Ambulatory Visit (INDEPENDENT_AMBULATORY_CARE_PROVIDER_SITE_OTHER): Payer: Medicare Other | Admitting: *Deleted

## 2018-03-15 DIAGNOSIS — I255 Ischemic cardiomyopathy: Secondary | ICD-10-CM

## 2018-03-15 LAB — CUP PACEART INCLINIC DEVICE CHECK
Battery Remaining Longevity: 137 mo
Battery Voltage: 3.14 V
Brady Statistic RV Percent Paced: 0 %
HIGH POWER IMPEDANCE MEASURED VALUE: 67 Ohm
Lead Channel Impedance Value: 399 Ohm
Lead Channel Impedance Value: 456 Ohm
Lead Channel Setting Pacing Amplitude: 3.5 V
Lead Channel Setting Pacing Pulse Width: 0.4 ms
Lead Channel Setting Sensing Sensitivity: 0.3 mV
MDC IDC LEAD IMPLANT DT: 20190606
MDC IDC LEAD LOCATION: 753860
MDC IDC MSMT LEADCHNL RV PACING THRESHOLD AMPLITUDE: 0.75 V
MDC IDC MSMT LEADCHNL RV PACING THRESHOLD PULSEWIDTH: 0.4 ms
MDC IDC MSMT LEADCHNL RV SENSING INTR AMPL: 25.25 mV
MDC IDC MSMT LEADCHNL RV SENSING INTR AMPL: 26.75 mV
MDC IDC PG IMPLANT DT: 20190606
MDC IDC SESS DTM: 20190619153332

## 2018-03-15 NOTE — Progress Notes (Signed)
Wound check appointment. Steri-strips removed. Wound without redness or edema. Incision edges approximated, wound well healed. Normal device function. Thresholds, sensing, and impedances consistent with implant measurements. Device programmed at 3.5V for extra safety margin until 3 month visit. Histogram distribution appropriate for patient and level of activity. No ventricular arrhythmias noted. Patient educated about wound care, arm mobility, lifting restrictions, shock plan. ROV 06/06/2018 w/ WC. Pt educated about remote monitoring and that is not necessary to send a transmission everyday, pt voiced understanding

## 2018-03-16 ENCOUNTER — Other Ambulatory Visit: Payer: Self-pay | Admitting: *Deleted

## 2018-03-19 ENCOUNTER — Other Ambulatory Visit: Payer: Self-pay | Admitting: Radiology

## 2018-03-20 ENCOUNTER — Other Ambulatory Visit: Payer: Self-pay | Admitting: Radiology

## 2018-03-21 ENCOUNTER — Ambulatory Visit (HOSPITAL_COMMUNITY)
Admission: RE | Admit: 2018-03-21 | Discharge: 2018-03-21 | Disposition: A | Discharge status: Home / Self Care | Payer: Medicare Other | Source: Ambulatory Visit | Attending: Interventional Radiology | Admitting: Interventional Radiology

## 2018-03-21 ENCOUNTER — Encounter (HOSPITAL_COMMUNITY): Payer: Self-pay

## 2018-03-21 ENCOUNTER — Ambulatory Visit (HOSPITAL_COMMUNITY)
Admission: RE | Admit: 2018-03-21 | Discharge: 2018-03-21 | Disposition: A | Discharge status: Home / Self Care | Payer: Medicare Other | Source: Ambulatory Visit | Attending: Pulmonary Disease | Admitting: Pulmonary Disease

## 2018-03-21 DIAGNOSIS — Z7982 Long term (current) use of aspirin: Secondary | ICD-10-CM | POA: Insufficient documentation

## 2018-03-21 DIAGNOSIS — F329 Major depressive disorder, single episode, unspecified: Secondary | ICD-10-CM | POA: Diagnosis not present

## 2018-03-21 DIAGNOSIS — C3411 Malignant neoplasm of upper lobe, right bronchus or lung: Secondary | ICD-10-CM | POA: Insufficient documentation

## 2018-03-21 DIAGNOSIS — I700 Atherosclerosis of aorta: Secondary | ICD-10-CM | POA: Insufficient documentation

## 2018-03-21 DIAGNOSIS — Z955 Presence of coronary angioplasty implant and graft: Secondary | ICD-10-CM | POA: Insufficient documentation

## 2018-03-21 DIAGNOSIS — I255 Ischemic cardiomyopathy: Secondary | ICD-10-CM | POA: Insufficient documentation

## 2018-03-21 DIAGNOSIS — Z888 Allergy status to other drugs, medicaments and biological substances status: Secondary | ICD-10-CM | POA: Insufficient documentation

## 2018-03-21 DIAGNOSIS — Z95810 Presence of automatic (implantable) cardiac defibrillator: Secondary | ICD-10-CM | POA: Insufficient documentation

## 2018-03-21 DIAGNOSIS — Z86718 Personal history of other venous thrombosis and embolism: Secondary | ICD-10-CM | POA: Insufficient documentation

## 2018-03-21 DIAGNOSIS — R911 Solitary pulmonary nodule: Secondary | ICD-10-CM | POA: Diagnosis not present

## 2018-03-21 DIAGNOSIS — Z79899 Other long term (current) drug therapy: Secondary | ICD-10-CM | POA: Diagnosis not present

## 2018-03-21 DIAGNOSIS — J45909 Unspecified asthma, uncomplicated: Secondary | ICD-10-CM | POA: Insufficient documentation

## 2018-03-21 DIAGNOSIS — R918 Other nonspecific abnormal finding of lung field: Secondary | ICD-10-CM | POA: Diagnosis not present

## 2018-03-21 DIAGNOSIS — Z9889 Other specified postprocedural states: Secondary | ICD-10-CM

## 2018-03-21 DIAGNOSIS — C3490 Malignant neoplasm of unspecified part of unspecified bronchus or lung: Secondary | ICD-10-CM | POA: Diagnosis not present

## 2018-03-21 DIAGNOSIS — F419 Anxiety disorder, unspecified: Secondary | ICD-10-CM | POA: Diagnosis not present

## 2018-03-21 DIAGNOSIS — E7800 Pure hypercholesterolemia, unspecified: Secondary | ICD-10-CM | POA: Diagnosis not present

## 2018-03-21 DIAGNOSIS — Z87891 Personal history of nicotine dependence: Secondary | ICD-10-CM | POA: Diagnosis not present

## 2018-03-21 DIAGNOSIS — I252 Old myocardial infarction: Secondary | ICD-10-CM | POA: Insufficient documentation

## 2018-03-21 LAB — CBC
HCT: 45.8 % (ref 39.0–52.0)
HEMOGLOBIN: 14.5 g/dL (ref 13.0–17.0)
MCH: 30 pg (ref 26.0–34.0)
MCHC: 31.7 g/dL (ref 30.0–36.0)
MCV: 94.6 fL (ref 78.0–100.0)
PLATELETS: 284 10*3/uL (ref 150–400)
RBC: 4.84 MIL/uL (ref 4.22–5.81)
RDW: 14.1 % (ref 11.5–15.5)
WBC: 9.9 10*3/uL (ref 4.0–10.5)

## 2018-03-21 LAB — PROTIME-INR
INR: 1.04
Prothrombin Time: 13.5 seconds (ref 11.4–15.2)

## 2018-03-21 MED ORDER — FENTANYL CITRATE (PF) 100 MCG/2ML IJ SOLN
INTRAMUSCULAR | Status: AC
  Filled 2018-03-21: qty 4

## 2018-03-21 MED ORDER — FENTANYL CITRATE (PF) 100 MCG/2ML IJ SOLN
INTRAMUSCULAR | Status: AC | PRN
  Administered 2018-03-21: 50 ug via INTRAVENOUS
  Administered 2018-03-21: 25 ug via INTRAVENOUS

## 2018-03-21 MED ORDER — MIDAZOLAM HCL 2 MG/2ML IJ SOLN
INTRAMUSCULAR | Status: AC | PRN
  Administered 2018-03-21: 0.5 mg via INTRAVENOUS
  Administered 2018-03-21: 1 mg via INTRAVENOUS

## 2018-03-21 MED ORDER — MIDAZOLAM HCL 2 MG/2ML IJ SOLN
INTRAMUSCULAR | Status: AC
  Filled 2018-03-21: qty 4

## 2018-03-21 MED ORDER — LIDOCAINE HCL 1 % IJ SOLN
INTRAMUSCULAR | Status: AC
  Filled 2018-03-21: qty 20

## 2018-03-21 MED ORDER — SODIUM CHLORIDE 0.9 % IV SOLN: INTRAVENOUS | Status: DC

## 2018-03-21 NOTE — Procedures (Signed)
Pre procedural Dx: Hypermetabolic right upper lobe mass  Post procedural Dx: Same  Technically successful CT guided biopsy of hypermetabolic right upper lobe mass.    EBL: None.   Complications: None immediate.   Ronny Bacon, MD Pager #: [phone]

## 2018-03-21 NOTE — Discharge Instructions (Signed)
Needle Biopsy of the Lung, Care After °This sheet gives you information about how to care for yourself after your procedure. Your health care provider may also give you more specific instructions. If you have problems or questions, contact your health care provider. °What can I expect after the procedure? °After the procedure, it is common to have: °· Soreness, pain, and tenderness where a tissue sample was taken (biopsy site). °· A cough. °· A sore throat. ° °Follow these instructions at home: °Biopsy site care °· Follow instructions from your health care provider about when to remove the bandage that was placed on the biopsy site. °· Keep the bandage dry until it has been removed. °· Check your biopsy site every day for signs of infection. Check for: °? More redness, swelling, or pain. °? More fluid or blood. °? Warmth to the touch. °? Pus or a bad smell. °General instructions °· Rest as directed by your health care provider. Ask your health care provider what activities are safe for you. °· Do not take baths, swim, or use a hot tub until your health care provider approves. °· Take over-the-counter and prescription medicines only as told by your health care provider. °· If you have airplane travel scheduled, talk with your health care provider about when it is safe for you to travel by airplane. °· It is up to you to get the results of your procedure. Ask your health care provider, or the department that is doing the procedure, when your results will be ready. °· Keep all follow-up visits as told by your health care provider. This is important. °Contact a health care provider if: °· You have more redness, swelling, or pain around your biopsy site. °· You have more fluid or blood coming from your biopsy site. °· Your biopsy site feels warm to the touch. °· You have pus or a bad smell coming from your biopsy site. °· You have a fever. °· You have pain that does not get better with medicine. °Get help right away  if: °· You have problems breathing. °· You have chest pain. °· You cough up blood. °· You faint. °· You have a fast heart rate. °Summary °· After a needle biopsy of the lung, it is common to have a cough, a sore throat, or soreness, pain, and tenderness where a tissue sample was taken (biopsy site). °· You should check your biopsy area every day for signs of infection, including pus or a bad smell, warmth, more fluid or blood, or more redness, swelling, or pain. °· You should not take baths, swim, or use a hot tub until your health care provider approves. °· It is up to you to get the results of your procedure. Ask your health care provider, or the department that is doing the procedure, when your results will be ready. °This information is not intended to replace advice given to you by your health care provider. Make sure you discuss any questions you have with your health care provider. °Document Released: 07/11/2007 Document Revised: 08/04/2016 Document Reviewed: 08/04/2016 °[employer] Interactive Patient Education © 2017 [employer] ° ° °

## 2018-03-21 NOTE — H&P (Signed)
Chief Complaint: Patient was seen in consultation today for right lung mass biopsy at the request of [name],[name]  Referring Physician(s): [name]  Supervising Physician: [name]  Patient Status: [name] - Out-pt  History of Present Illness: [name] is a 57 y.o. male   Incidentally found right lung nodule During work up for defibrillator  CT 03/03/18:  IMPRESSION: 1. 7.9 x 7.1 x 6.5 cm right upper lobe mass with evidence of probable direct chest wall invasion as well as right hilar and potentially right paratracheal lymphadenopathy. Further evaluation with nonemergent PET-CT is recommended in the near future for staging purposes.  PET 03/10/18:  IMPRESSION: 1. 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 Possible chest wall invasion, as on CT. 2. Isolated right level 2 small hypermetabolic lymph node, favored to be reactive. 3. Left parotid 6 mm hypermetabolic lesion is favored to represent a benign or malignant primary neoplasm. 4. No subdiaphragmatic metastatic disease identified.  Dr [name] requesting Rt lung mass biopsy Scheduled now for same  Past Medical History:  Diagnosis Date  . 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 Anxiety   . Asthma   . Depression   . DVT (deep venous thrombosis) ([place]) 2009; 2019   RLE; LLE  . Hepatitis C    "tx'd in 2015"  . High cholesterol   . STEMI (ST elevation myocardial infarction) ([place]) 05/26/2016  . Tobacco abuse 01/28/2016  . Urinary dribbling     Past Surgical History:  Procedure Laterality Date  . AORTOGRAM  08/08/2009   for LLE claudication     By Dr. Oneida Alar  . 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 BIOPSY N/A 05/30/2014   Procedure: BIOPSY;  Surgeon: [name], MD;  Location: [facility];  Service: Endoscopy;  Laterality: N/A;  . BLADDER TUMOR EXCISION  8/812  . CARDIAC CATHETERIZATION N/A 05/26/2016   Procedure: Left Heart Cath and  Coronary Angiography;  Surgeon: [name], MD;  Location: [place] [facility];  Service: Cardiovascular;  Laterality: N/A;  . CARDIAC CATHETERIZATION N/A 05/26/2016   Procedure: Coronary Stent Intervention;  Surgeon: [name], MD;  Location: [place] [facility];  Service: Cardiovascular;  Laterality: N/A;  . COLONOSCOPY WITH PROPOFOL N/A 05/30/2014   IWP:YKDXIP colonic polyp-likely source of hematochezia-removed as described above  . ESOPHAGOGASTRODUODENOSCOPY (EGD) WITH PROPOFOL N/A 05/30/2014   JAS:NKNLZJ and bulbar erosions s/p gastric biopsy. No evidence of portal gastropathy on today's examination.  . FEMORAL-TIBIAL BYPASS GRAFT  2009   Right side using non-reversed GSV   By Dr. Oneida Alar  . FEMORAL-TIBIAL BYPASS GRAFT  11/24/2007   Right femoral to anterior tibial BPG   by Dr. Oneida Alar  . FINGER SURGERY Left    "straightened my pinky"  . HAND TENDON SURGERY Left 2013   Left 5th finger  . HERNIA REPAIR     "stomach"  . ICD IMPLANT N/A 03/02/2018   Procedure: ICD IMPLANT;  Surgeon: [name], MD;  Location: [place] [facility];  Service: Cardiovascular;  Laterality: N/A;  . INCISIONAL HERNIA REPAIR N/A 12/25/2014   Procedure: HERNIA REPAIR INCISIONAL WITH MESH;  Surgeon: [name], MD;  Location: [facility];  Service: General;  Laterality: N/A;  . INSERTION OF MESH N/A 12/25/2014   Procedure: INSERTION OF MESH;  Surgeon: [name], MD;  Location: [facility];  Service: General;  Laterality: N/A;  . LAPAROSCOPIC CHOLECYSTECTOMY    . LOWER EXTREMITY ANGIOGRAM Left  12/30/2015   Procedure: Lower Extremity Angiogram;  Surgeon: [name], MD;  Location: [place] [facility];  Service: Cardiovascular;  Laterality: Left;  . PERIPHERAL VASCULAR CATHETERIZATION N/A 12/30/2015   Procedure: Abdominal Aortogram;  Surgeon: [name], MD;  Location: [place] [facility];  Service: Cardiovascular;  Laterality: N/A;  . POLYPECTOMY  05/30/2014   Procedure: POLYPECTOMY;  Surgeon: [name], MD;  Location: [facility];  Service: Endoscopy;;  . TONSILLECTOMY AND ADENOIDECTOMY  ~ 1970  . TYMPANOSTOMY TUBE PLACEMENT Bilateral ~ 1970    Allergies: Lyrica [pregabalin] and Neurontin [gabapentin]  Medications: Prior to Admission medications   Medication Sig Start Date End Date Taking? Authorizing Provider  alprazolam [name]) 2 MG tablet Take 2 mg by mouth 4 (four) times daily as needed for anxiety.    Yes [provider]  amitriptyline (ELAVIL) 100 MG tablet Take 100 mg by mouth at bedtime.    Yes [provider]  aspirin 81 MG EC tablet Take 81 mg by mouth daily.     Yes [provider]  atorvastatin (LIPITOR) 80 MG tablet Take 1 tablet (80 mg total) by mouth daily at 6 PM. 05/28/16  Yes [name], NP  carvedilol (COREG) 6.25 MG tablet TAKE 1 TABLET BY MOUTH TWICE A DAY WITH A MEAL 02/27/18  Yes [name], MD  citalopram (CELEXA) 40 MG tablet Take 40 mg by mouth daily. 12/02/14  Yes [provider]  HYDROcodone-acetaminophen (NORCO) 10-325 MG tablet Take 1 tablet by mouth every 4 (four) hours as needed for moderate pain.  06/22/16  Yes [provider]  lisinopril (PRINIVIL,ZESTRIL) 2.5 MG tablet Take 1 tablet (2.5 mg total) by mouth daily. 05/28/16  Yes [name], NP  spironolactone (ALDACTONE) 25 MG tablet TAKE 1 TABLET BY MOUTH EVERY DAY 03/13/18  Yes [name], MD  tamsulosin (FLOMAX) 0.4 MG CAPS capsule Take 1 capsule by mouth daily. 01/29/18  Yes [provider]  nicotine (NICODERM CQ - DOSED IN MG/24 HOURS) 21 mg/24hr patch Place 21 mg onto the skin daily.    [provider]  nitroGLYCERIN (NITROSTAT) 0.4 MG SL tablet Place 1 tablet (0.4 mg total) under the tongue every 5 (five) minutes x 3 doses as needed for chest pain. 05/28/16   [name], NP     Family History  Problem Relation Age of Onset  . Vision loss Mother   . Ulcers Father   . Colon cancer Neg Hx     Social History   Socioeconomic  History  . Marital status: Married    Spouse name: Not on file  . Number of children: Not on file  . Years of education: Not on file  . Highest education level: Not on file  Occupational History  . Not on file  Social Needs  . Financial resource strain: Not on file  . Food insecurity:    Worry: Not on file    Inability: Not on file  . Transportation needs:    Medical: Not on file    Non-medical: Not on file  Tobacco Use  . Smoking status: Former Smoker    Packs/day: 0.50    Years: 31.00    Pack years: 15.50    Types: Cigarettes  . Smokeless tobacco: Never Used  Substance and Sexual Activity  . Alcohol use: Yes    Alcohol/week: 10.8 oz    Types: 18 Cans of beer per week    Comment:  history of  heavy ETOH use; 03/02/2018 "6-12 beers Fri & Sat"  . Drug use: Yes    Types: Marijuana    Comment: 03/02/2018 "weekly"  . Sexual activity: Not Currently  Lifestyle  . Physical activity:    Days per week: Not on file    Minutes per session: Not on file  . Stress: Not on file  Relationships  . Social connections:    Talks on phone: Not on file    Gets together: Not on file    Attends religious service: Not on file    Active member of club or organization: Not on file    Attends meetings of clubs or organizations: Not on file    Relationship status: Not on file  Other Topics Concern  . Not on file  Social History Narrative  . Not on file    Review of Systems: A 12 point ROS discussed and pertinent positives are indicated in the HPI above.  All other systems are negative.  Review of Systems  Constitutional: Negative for activity change, fatigue and fever.  Respiratory: Positive for cough. Negative for shortness of breath.   Cardiovascular: Negative for chest pain.  Gastrointestinal: Negative for abdominal pain.  Musculoskeletal: Negative for back pain.  Psychiatric/Behavioral: Negative for behavioral problems and confusion.    Vital Signs: BP 103/72 (BP Location: Right Arm)    Pulse 78   Temp 98.6 F (37 C) (Oral)   Ht 6' (1.829 m)   Wt 240 lb (108.9 kg)   SpO2 97%   BMI 32.55 kg/m   Physical Exam  Constitutional: He is oriented to person, place, and time.  Cardiovascular: Normal rate, regular rhythm and normal heart sounds.  Pulmonary/Chest: Effort normal. He has wheezes.  Abdominal: Soft. Bowel sounds are normal.  Musculoskeletal: Normal range of motion.  R BKA  Neurological: He is alert and oriented to person, place, and time.  Skin: Skin is warm and dry.  Psychiatric: He has a normal mood and affect. His behavior is normal. Judgment and thought content normal.  Nursing note and vitals reviewed.   Imaging: Dg Chest 2 View  Result Date: 03/03/2018 CLINICAL DATA:  Pacemaker placement, history former smoker, asthma, MI, ischemic cardiomyopathy, chronic hepatitis-C EXAM: CHEST - 2 VIEW COMPARISON:  04/27/2011 FINDINGS: LEFT subclavian AICD lead tip projects at RIGHT ventricle. Normal heart size, mediastinal contours and pulmonary vascularity. Large RIGHT upper lobe mass question neoplasm, 8.0 x 6.7 cm, new since 2012. Remaining lungs clear. No pulmonary infiltrate, pleural effusion or pneumothorax. Bones unremarkable. IMPRESSION: No pneumothorax following LEFT subclavian AICD placement. Large RIGHT upper lobe mass 8.0 x 6.7 cm highly suspicious for a pulmonary neoplasm; CT chest with contrast recommended for further evaluation. Findings called to [place] on [facility] on 03/03/2018 at 0915 hours. Electronically Signed   By: Lavonia Dana M.D.   On: 03/03/2018 09:16   Ct Chest W Contrast  Result Date: 03/03/2018 CLINICAL DATA:  57 year old male with history of abnormal chest x-ray suspicious for lung cancer. Follow-up study. EXAM: CT CHEST WITH CONTRAST TECHNIQUE: Multidetector CT imaging of the chest was performed during intravenous contrast administration. CONTRAST:  157mL OMNIPAQUE IOHEXOL 300 MG/ML  SOLN COMPARISON:  Chest CT 04/05/2008. FINDINGS:  Cardiovascular: Heart size is normal. There is no significant pericardial fluid, thickening or pericardial calcification. There is aortic atherosclerosis, as well as atherosclerosis of the great vessels of the mediastinum and the coronary arteries, including calcified atherosclerotic plaque in the left anterior descending coronary arteries. Status post PTCI to the  LAD. Mild aneurysmal dilatation of the left ventricular apical myocardium presumably from prior distal LAD territory myocardial infarction. Left-sided pacemaker/AICD device in place with lead tip terminating in the right ventricular apex. Mediastinum/Nodes: Enlarged right hilar lymph nodes measuring up to 15 mm in short axis. Multiple other borderline enlarged right paratracheal lymph nodes are noted, nonspecific, but certainly suspicious given the large right upper lobe mass. No contralateral mediastinal or hilar lymphadenopathy. Esophagus is unremarkable in appearance. No supraclavicular lymphadenopathy. No axillary lymphadenopathy. Lungs/Pleura: In the right upper lobe there is a large macrolobulated mass with slightly spiculated margins which measures 7.9 x 7.1 x 6.5 cm, which abuts the pleural surface superiorly and laterally, and makes contact with the superior aspect of the left major fissure which is tethered to the lesion. This lesion does not appear to cross the major fissure at this time. There are some adjacent postobstructive changes distal to the lesion in the periphery of the right upper lobe. Adjacent to the lesion there is extensive pleural thickening, with some effacement of the subpleural fat best appreciated on axial image 46 of series 3, which suggests early direct pleural invasion, potentially even early chest wall invasion. Sessile nodule in the posterior aspect of the left upper lobe abutting the major fissure measuring 10 x 4 mm (mean diameter of 7 mm), strongly favored to represent a benign subpleural lymph node. Ground-glass  attenuation nodule in the periphery of the left lower lobe (axial image 80 of series 4), nonspecific. Small nodule in the anterior aspect of the left upper lobe also noted on the same image measuring 6 mm. Small calcified granuloma in the left lower lobe also incidentally noted. No acute consolidative airspace disease. No pleural effusions. Diffuse bronchial wall thickening with mild centrilobular and paraseptal emphysema. Upper Abdomen: Diffuse low attenuation throughout the visualized hepatic parenchyma, strongly suggestive of hepatic steatosis (difficult to say for certain on today's contrast enhanced examination). Status post cholecystectomy. Musculoskeletal: There are no aggressive appearing lytic or blastic lesions noted in the visualized portions of the skeleton. IMPRESSION: 1. 7.9 x 7.1 x 6.5 cm right upper lobe mass with evidence of probable direct chest wall invasion as well as right hilar and potentially right paratracheal lymphadenopathy. Further evaluation with nonemergent PET-CT is recommended in the near future for staging purposes. 2. Aortic atherosclerosis, in addition to left anterior descending coronary artery disease. Please note that although the presence of coronary artery calcium documents the presence of coronary artery disease, the severity of this disease and any potential stenosis cannot be assessed on this non-gated CT examination. Assessment for potential risk factor modification, dietary therapy or pharmacologic therapy may be warranted, if clinically indicated. 3. Mild diffuse bronchial wall thickening with mild centrilobular and paraseptal emphysema; imaging findings suggestive of underlying COPD. 4. Probable hepatic steatosis. 5. Additional incidental findings, as above. Aortic Atherosclerosis (ICD10-I70.0). Electronically Signed   By: Vinnie Langton M.D.   On: 03/03/2018 15:11   Nm Pet Image Initial (pi) Skull Base To Thigh  Result Date: 03/10/2018 CLINICAL DATA:  Initial  treatment strategy for right upper lobe lung mass on chest CT. EXAM: NUCLEAR MEDICINE PET SKULL BASE TO THIGH TECHNIQUE: 11.8 mCi F-18 FDG was injected intravenously. Full-ring PET imaging was performed from the skull base to thigh after the radiotracer. CT data was obtained and used for attenuation correction and anatomic localization. Fasting blood glucose: 105 mg/dl COMPARISON:  Chest CT 03/03/2018 FINDINGS: Mediastinal blood pool activity: SUV max 3.3 NECK: A left deep parotid nodule measures  6 mm and a S.U.Costa. max of 4.9 on image 21/4. A right-sided level 2 node measures 7 mm and a S.U.[place]osta. max of 3.1 on image 29/4. Incidental CT findings: No cervical adenopathy. Mucous retention cysts or polyps in both maxillary sinuses. CHEST: Hypermetabolism corresponding to the right upper lobe lung mass with possible chest wall invasion. This measures 7.8 cm and a S.U.Costa. max of 22.6. Right hilar hypermetabolic node at a S.U.Costa. max of 6.5. Incidental CT findings: Other findings deferred to recent diagnostic chest CT. No superimposed acute process. No focal osseous lesion. ABDOMEN/PELVIS: No abdominopelvic nodal or parenchymal hypermetabolism. Incidental CT findings: Cholecystectomy. Normal adrenal glands. Left common iliac artery stent. SKELETON: No abnormal marrow activity. Incidental CT findings: none IMPRESSION: 1. 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 Possible chest wall invasion, as on CT. 2. Isolated right level 2 small hypermetabolic lymph node, favored to be reactive. 3. Left parotid 6 mm hypermetabolic lesion is favored to represent a benign or malignant primary neoplasm. 4. No subdiaphragmatic metastatic disease identified. Electronically Signed   By: Abigail Miyamoto M.D.   On: 03/10/2018 17:52    Labs:  CBC: Recent Labs    02/27/18 1457 03/21/18 0931  WBC 11.5* 9.9  HGB 16.4 14.5  HCT 47.6 45.8  PLT 307 284    COAGS: Recent Labs    03/21/18 0931  INR 1.04     BMP: Recent Labs    01/31/18 1124 02/17/18 1247 02/27/18 1457  NA 140 141 139  K 4.4 4.6 4.8  CL 100 97 98  CO2 24 23 25   GLUCOSE 103* 81 76  BUN 7 7 7   CALCIUM 9.0 9.3 9.7  CREATININE 0.87 0.98 1.09  GFRNONAA 97 86 76  GFRAA 112 100 88    LIVER FUNCTION TESTS: Recent Labs    01/31/18 1124  BILITOT 0.4  AST 26  ALT 30  ALKPHOS 128*  PROT 6.5  ALBUMIN 4.4    TUMOR MARKERS: No results for input(s): AFPTM, CEA, CA199, CHROMGRNA in the last 8760 hours.  Assessment and Plan:  Incidental right lung nodule +PET Now for biopsy Risks and benefits discussed with the patient including, but not limited to bleeding, hemoptysis, respiratory failure requiring intubation, infection, pneumothorax requiring chest tube placement, stroke from air embolism or even death.  All of the patient's questions were answered, patient is agreeable to proceed. Consent signed and in chart.   Thank you for this interesting consult.  I greatly enjoyed meeting JANCARLO BIERMANN and look forward to participating in their care.  A copy of this report was sent to the requesting provider on this date.  Electronically Signed: Lavonia Drafts, PA-C 03/21/2018, 10:28 AM   I spent a total of  30 Minutes   in face to face in clinical consultation, greater than 50% of which was counseling/coordinating care for right lung mass biopsy

## 2018-03-22 ENCOUNTER — Telehealth: Payer: Self-pay | Admitting: Internal Medicine

## 2018-03-22 ENCOUNTER — Inpatient Hospital Stay: Payer: Medicare Other

## 2018-03-22 ENCOUNTER — Encounter: Payer: Self-pay | Admitting: Internal Medicine

## 2018-03-22 ENCOUNTER — Inpatient Hospital Stay: Payer: Medicare Other | Attending: Internal Medicine | Admitting: Internal Medicine

## 2018-03-22 ENCOUNTER — Ambulatory Visit: Payer: Medicare Other | Admitting: Pulmonary Disease

## 2018-03-22 VITALS — BP 115/69 | HR 76 | Temp 97.9°F | Resp 18 | Ht 72.0 in | Wt 237.3 lb

## 2018-03-22 DIAGNOSIS — R918 Other nonspecific abnormal finding of lung field: Secondary | ICD-10-CM

## 2018-03-22 DIAGNOSIS — Z720 Tobacco use: Secondary | ICD-10-CM | POA: Diagnosis not present

## 2018-03-22 DIAGNOSIS — R05 Cough: Secondary | ICD-10-CM | POA: Diagnosis not present

## 2018-03-22 DIAGNOSIS — R599 Enlarged lymph nodes, unspecified: Secondary | ICD-10-CM | POA: Diagnosis not present

## 2018-03-22 DIAGNOSIS — I739 Peripheral vascular disease, unspecified: Secondary | ICD-10-CM

## 2018-03-22 DIAGNOSIS — Z8659 Personal history of other mental and behavioral disorders: Secondary | ICD-10-CM

## 2018-03-22 DIAGNOSIS — C3490 Malignant neoplasm of unspecified part of unspecified bronchus or lung: Secondary | ICD-10-CM

## 2018-03-22 DIAGNOSIS — Z5111 Encounter for antineoplastic chemotherapy: Secondary | ICD-10-CM

## 2018-03-22 DIAGNOSIS — Z95810 Presence of automatic (implantable) cardiac defibrillator: Secondary | ICD-10-CM

## 2018-03-22 DIAGNOSIS — C3491 Malignant neoplasm of unspecified part of right bronchus or lung: Secondary | ICD-10-CM | POA: Insufficient documentation

## 2018-03-22 DIAGNOSIS — R0609 Other forms of dyspnea: Secondary | ICD-10-CM

## 2018-03-22 DIAGNOSIS — Z89511 Acquired absence of right leg below knee: Secondary | ICD-10-CM | POA: Diagnosis not present

## 2018-03-22 DIAGNOSIS — Z7189 Other specified counseling: Secondary | ICD-10-CM | POA: Insufficient documentation

## 2018-03-22 DIAGNOSIS — I252 Old myocardial infarction: Secondary | ICD-10-CM | POA: Diagnosis not present

## 2018-03-22 HISTORY — DX: Malignant neoplasm of unspecified part of right bronchus or lung: C34.91

## 2018-03-22 HISTORY — DX: Encounter for antineoplastic chemotherapy: Z51.11

## 2018-03-22 MED ORDER — PROCHLORPERAZINE MALEATE 10 MG PO TABS: 10.0000 mg | ORAL_TABLET | Freq: Four times a day (QID) | ORAL | 0 refills | Status: DC | PRN

## 2018-03-22 NOTE — Patient Instructions (Signed)
Steps to Quit Smoking Smoking tobacco can be bad for your health. It can also affect almost every organ in your body. Smoking puts you and people around you at risk for many serious long-lasting (chronic) diseases. Quitting smoking is hard, but it is one of the best things that you can do for your health. It is never too late to quit. What are the benefits of quitting smoking? When you quit smoking, you lower your risk for getting serious diseases and conditions. They can include:  Lung cancer or lung disease.  Heart disease.  Stroke.  Heart attack.  Not being able to have children (infertility).  Weak bones (osteoporosis) and broken bones (fractures).  If you have coughing, wheezing, and shortness of breath, those symptoms may get better when you quit. You may also get sick less often. If you are pregnant, quitting smoking can help to lower your chances of having a baby of low birth weight. What can I do to help me quit smoking? Talk with your doctor about what can help you quit smoking. Some things you can do (strategies) include:  Quitting smoking totally, instead of slowly cutting back how much you smoke over a period of time.  Going to in-person counseling. You are more likely to quit if you go to many counseling sessions.  Using resources and support systems, such as: ? Online chats with a counselor. ? Phone quitlines. ? Printed self-help materials. ? Support groups or group counseling. ? Text messaging programs. ? Mobile phone apps or applications.  Taking medicines. Some of these medicines may have nicotine in them. If you are pregnant or breastfeeding, do not take any medicines to quit smoking unless your doctor says it is okay. Talk with your doctor about counseling or other things that can help you.  Talk with your doctor about using more than one strategy at the same time, such as taking medicines while you are also going to in-person counseling. This can help make  quitting easier. What things can I do to make it easier to quit? Quitting smoking might feel very hard at first, but there is a lot that you can do to make it easier. Take these steps:  Talk to your family and friends. Ask them to support and encourage you.  Call phone quitlines, reach out to support groups, or work with a counselor.  Ask people who smoke to not smoke around you.  Avoid places that make you want (trigger) to smoke, such as: ? Bars. ? Parties. ? Smoke-break areas at work.  Spend time with people who do not smoke.  Lower the stress in your life. Stress can make you want to smoke. Try these things to help your stress: ? Getting regular exercise. ? Deep-breathing exercises. ? Yoga. ? Meditating. ? Doing a body scan. To do this, close your eyes, focus on one area of your body at a time from head to toe, and notice which parts of your body are tense. Try to relax the muscles in those areas.  Download or buy apps on your mobile phone or tablet that can help you stick to your quit plan. There are many free apps, such as QuitGuide from the CDC (Centers for Disease Control and Prevention). You can find more support from smokefree.gov and other websites.  This information is not intended to replace advice given to you by your health care provider. Make sure you discuss any questions you have with your health care provider. Document Released: 07/10/2009 Document   Revised: 05/11/2016 Document Reviewed: 01/28/2015 Elsevier Interactive Patient Education  2018 [employer]  

## 2018-03-22 NOTE — Progress Notes (Signed)
[place] [phone]   [phone]  CONSULT NOTE  REFERRING PHYSICIAN: Dr. Kara Mead  REASON FOR CONSULTATION:  57 years old white male recently diagnosed with lung cancer.  HPI [name] is a 57 y.o. male with past medical history significant for myocardial infarction, dyslipidemia, deep venous thrombosis, peripheral vascular disease, depression and anxiety, status post BKA of the right leg, status post AICD placement.  The patient underwent AICD placement on March 03, 2018.  Preoperative chest x-ray was performed that day and it showed large right upper lobe mass measuring 8.0 x 6.7 cm.  This was followed by CT scan of the chest on the same day and it showed 7.9 x 7.1 x 6.5 cm right upper lobe mass with suspicious chest wall invasion as well as right hilar and questionable right paratracheal lymph node.  The patient had a PET scan on 03/10/2018 and that showed hypermetabolic right upper lobe lung mass with possible chest wall invasion and it measures 7.8 cm with SUV max of 22.6.  There was also right hilar metabolic node with SUV max of 6.5.  There was a right-sided level 2 node measuring 0.7 cm with SUV max of 3.1.  There was also a left deep parotid nodule measuring 0.6 cm with SUV max of 4.9. The patient was seen by Dr. [name] and he ordered a CT guided core biopsy of the right upper lobe lung mass that was performed on March 21, 2018 and the preliminary pathology from Dr. Tresa Moore indicated non-small cell carcinoma.  Further immunohistochemical stains are currently performed to identify the histology subtype. The patient was referred to me today for evaluation and recommendation regarding treatment of his condition. When seen today he is feeling fine except for mild cough and shortness of breath with exertion but no significant chest pain or hemoptysis.  He denied having any recent weight loss or night sweats.  He has no nausea, vomiting, diarrhea or  constipation.  He denied having any headache or visual changes. Family history significant for father with peripheral vascular disease and mother is healthy. The patient is married and has 2 stepchildren.  He was accompanied today by his wife [name].  He used to work in [employer].  He has a history for smoking 1 pack/day for around 35 years and unfortunately he continues to smoke.  He also drinks few beers every day.  He has history of his smoking marijuana but no other drug abuse.  HPI  Past Medical History:  Diagnosis Date  . 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 Anxiety   . Asthma   . Depression   . DVT (deep venous thrombosis) ([place]) 2009; 2019   RLE; LLE  . Hepatitis C    "tx'd in 2015"  . High cholesterol   . STEMI (ST elevation myocardial infarction) ([place]) 05/26/2016  . Tobacco abuse 01/28/2016  . Urinary dribbling     Past Surgical History:  Procedure Laterality Date  . AORTOGRAM  08/08/2009   for LLE claudication     By Dr. Oneida Alar  . 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 BIOPSY N/A 05/30/2014   Procedure: BIOPSY;  Surgeon: [name], MD;  Location: [facility];  Service: Endoscopy;  Laterality: N/A;  . BLADDER TUMOR EXCISION  8/812  . CARDIAC CATHETERIZATION N/A 05/26/2016   Procedure: Left Heart Cath and Coronary Angiography;  Surgeon: [name], MD;  Location: [facility];  Service: Cardiovascular;  Laterality: N/A;  . CARDIAC CATHETERIZATION N/A 05/26/2016   Procedure: Coronary Stent Intervention;  Surgeon: [name], MD;  Location: [facility];  Service: Cardiovascular;  Laterality: N/A;  . COLONOSCOPY WITH PROPOFOL N/A 05/30/2014   WCB:JSEGBT colonic polyp-likely source of hematochezia-removed as described above  . ESOPHAGOGASTRODUODENOSCOPY (EGD) WITH PROPOFOL N/A 05/30/2014   DVV:OHYWVP and bulbar erosions s/p gastric biopsy. No evidence of portal gastropathy on today's examination.  . FEMORAL-TIBIAL BYPASS  GRAFT  2009   Right side using non-reversed GSV   By Dr. Oneida Alar  . FEMORAL-TIBIAL BYPASS GRAFT  11/24/2007   Right femoral to anterior tibial BPG   by Dr. Oneida Alar  . FINGER SURGERY Left    "straightened my pinky"  . HAND TENDON SURGERY Left 2013   Left 5th finger  . HERNIA REPAIR     "stomach"  . ICD IMPLANT N/A 03/02/2018   Procedure: ICD IMPLANT;  Surgeon: [name], MD;  Location: [facility];  Service: Cardiovascular;  Laterality: N/A;  . INCISIONAL HERNIA REPAIR N/A 12/25/2014   Procedure: HERNIA REPAIR INCISIONAL WITH MESH;  Surgeon: [name], MD;  Location: [facility];  Service: General;  Laterality: N/A;  . INSERTION OF MESH N/A 12/25/2014   Procedure: INSERTION OF MESH;  Surgeon: [name], MD;  Location: [facility];  Service: General;  Laterality: N/A;  . LAPAROSCOPIC CHOLECYSTECTOMY    . LOWER EXTREMITY ANGIOGRAM Left 12/30/2015   Procedure: Lower Extremity Angiogram;  Surgeon: [name], MD;  Location: [facility];  Service: Cardiovascular;  Laterality: Left;  . PERIPHERAL VASCULAR CATHETERIZATION N/A 12/30/2015   Procedure: Abdominal Aortogram;  Surgeon: [name], MD;  Location: [place] [facility];  Service: Cardiovascular;  Laterality: N/A;  . POLYPECTOMY  05/30/2014   Procedure: POLYPECTOMY;  Surgeon: [name], MD;  Location: [facility];  Service: Endoscopy;;  . TONSILLECTOMY AND ADENOIDECTOMY  ~ 1970  . TYMPANOSTOMY TUBE PLACEMENT Bilateral ~ 1970    Family History  Problem Relation Age of Onset  . Vision loss Mother   . Ulcers Father   . Colon cancer Neg Hx     Social History Social History   Tobacco Use  . Smoking status: Current Every Day Smoker    Packs/day: 0.50    Years: 31.00    Pack years: 15.50    Types: Cigarettes  . Smokeless tobacco: Never Used  Substance Use Topics  . Alcohol use: Yes    Alcohol/week: 10.8 oz    Types: 18 Cans of beer per week    Comment:  history of heavy ETOH use; 03/02/2018 "6-12 beers  Fri & Sat"  . Drug use: Yes    Types: Marijuana    Comment: 03/02/2018 "weekly"    Allergies  Allergen Reactions  . Lyrica [Pregabalin] Palpitations    Chest pain and heart palps.  . Neurontin [Gabapentin] Palpitations    Chest pain and heart palp    Current Outpatient Medications  Medication Sig Dispense Refill  . alprazolam (XANAX) 2 MG tablet Take 2 mg by mouth 4 (four) times daily as needed for anxiety.     [name] amitriptyline (ELAVIL) 100 MG tablet Take 100 mg by mouth at bedtime.     [name] aspirin 81 MG EC tablet Take 81 mg by mouth daily.      [name] atorvastatin (LIPITOR) 80 MG tablet Take 1 tablet (80 mg total) by mouth daily at 6 PM. 30 tablet 12  . carvedilol (  COREG) 6.25 MG tablet TAKE 1 TABLET BY MOUTH TWICE A DAY WITH A MEAL 60 tablet 6  . citalopram (CELEXA) 40 MG tablet Take 40 mg by mouth daily.  11  . HYDROcodone-acetaminophen (NORCO) 10-325 MG tablet Take 1 tablet by mouth every 4 (four) hours as needed for moderate pain.   0  . lisinopril (PRINIVIL,ZESTRIL) 2.5 MG tablet Take 1 tablet (2.5 mg total) by mouth daily. 30 tablet 12  . spironolactone (ALDACTONE) 25 MG tablet TAKE 1 TABLET BY MOUTH EVERY DAY 90 tablet 3  . tamsulosin (FLOMAX) 0.4 MG CAPS capsule Take 1 capsule by mouth daily.    . nicotine (NICODERM CQ - DOSED IN MG/24 HOURS) 21 mg/24hr patch Place 21 mg onto the skin daily.    . nitroGLYCERIN (NITROSTAT) 0.4 MG SL tablet Place 1 tablet (0.4 mg total) under the tongue every 5 (five) minutes x 3 doses as needed for chest pain. (Patient not taking: Reported on 03/22/2018) 25 tablet 2   No current facility-administered medications for this visit.     Review of Systems  Constitutional: negative Eyes: negative Ears, nose, mouth, throat, and face: negative Respiratory: positive for cough and dyspnea on exertion Cardiovascular: negative Gastrointestinal: negative Genitourinary:negative Integument/breast: negative Hematologic/lymphatic:  negative Musculoskeletal:negative Neurological: negative Behavioral/Psych: negative Endocrine: negative Allergic/Immunologic: negative  Physical Exam  VOZ:DGUYQ, healthy, no distress, well nourished, well developed and anxious SKIN: skin color, texture, turgor are normal, no rashes or significant lesions HEAD: Normocephalic, No masses, lesions, tenderness or abnormalities EYES: normal, PERRLA, Conjunctiva are pink and non-injected EARS: External ears normal, Canals clear OROPHARYNX:no exudate, no erythema and lips, buccal mucosa, and tongue normal  NECK: supple, no adenopathy, no JVD LYMPH:  no palpable lymphadenopathy, no hepatosplenomegaly LUNGS: clear to auscultation , and palpation HEART: regular rate & rhythm, no murmurs and no gallops ABDOMEN:abdomen soft, non-tender, normal bowel sounds and no masses or organomegaly BACK: Back symmetric, no curvature., No CVA tenderness EXTREMITIES: Right leg BKA NEURO: alert & oriented x 3 with fluent speech, no focal motor/sensory deficits  PERFORMANCE STATUS: ECOG 1  LABORATORY DATA: Lab Results  Component Value Date   WBC 9.9 03/21/2018   HGB 14.5 03/21/2018   HCT 45.8 03/21/2018   MCV 94.6 03/21/2018   PLT 284 03/21/2018      Chemistry      Component Value Date/Time   NA 139 02/27/2018 1457   K 4.8 02/27/2018 1457   CL 98 02/27/2018 1457   CO2 25 02/27/2018 1457   BUN 7 02/27/2018 1457   CREATININE 1.09 02/27/2018 1457   CREATININE 1.13 06/28/2016 1453      Component Value Date/Time   CALCIUM 9.7 02/27/2018 1457   ALKPHOS 128 (H) 01/31/2018 1124   AST 26 01/31/2018 1124   ALT 30 01/31/2018 1124   BILITOT 0.4 01/31/2018 1124       RADIOGRAPHIC STUDIES: Dg Chest 2 View  Result Date: 03/03/2018 CLINICAL DATA:  Pacemaker placement, history former smoker, asthma, MI, ischemic cardiomyopathy, chronic hepatitis-C EXAM: CHEST - 2 VIEW COMPARISON:  04/27/2011 FINDINGS: LEFT subclavian AICD lead tip projects at RIGHT  ventricle. Normal heart size, mediastinal contours and pulmonary vascularity. Large RIGHT upper lobe mass question neoplasm, 8.0 x 6.7 cm, new since 2012. Remaining lungs clear. No pulmonary infiltrate, pleural effusion or pneumothorax. Bones unremarkable. IMPRESSION: No pneumothorax following LEFT subclavian AICD placement. Large RIGHT upper lobe mass 8.0 x 6.7 cm highly suspicious for a pulmonary neoplasm; CT chest with contrast recommended for further evaluation. Findings called  to [facility] on [place] on 03/03/2018 at 0915 hours. Electronically Signed   By: Lavonia Dana M.D.   On: 03/03/2018 09:16   Ct Chest W Contrast  Result Date: 03/03/2018 CLINICAL DATA:  57 year old male with history of abnormal chest x-ray suspicious for lung cancer. Follow-up study. EXAM: CT CHEST WITH CONTRAST TECHNIQUE: Multidetector CT imaging of the chest was performed during intravenous contrast administration. CONTRAST:  17mL OMNIPAQUE IOHEXOL 300 MG/ML  SOLN COMPARISON:  Chest CT 04/05/2008. FINDINGS: Cardiovascular: Heart size is normal. There is no significant pericardial fluid, thickening or pericardial calcification. There is aortic atherosclerosis, as well as atherosclerosis of the great vessels of the mediastinum and the coronary arteries, including calcified atherosclerotic plaque in the left anterior descending coronary arteries. Status post PTCI to the LAD. Mild aneurysmal dilatation of the left ventricular apical myocardium presumably from prior distal LAD territory myocardial infarction. Left-sided pacemaker/AICD device in place with lead tip terminating in the right ventricular apex. Mediastinum/Nodes: Enlarged right hilar lymph nodes measuring up to 15 mm in short axis. Multiple other borderline enlarged right paratracheal lymph nodes are noted, nonspecific, but certainly suspicious given the large right upper lobe mass. No contralateral mediastinal or hilar lymphadenopathy. Esophagus is unremarkable in appearance.  No supraclavicular lymphadenopathy. No axillary lymphadenopathy. Lungs/Pleura: In the right upper lobe there is a large macrolobulated mass with slightly spiculated margins which measures 7.9 x 7.1 x 6.5 cm, which abuts the pleural surface superiorly and laterally, and makes contact with the superior aspect of the left major fissure which is tethered to the lesion. This lesion does not appear to cross the major fissure at this time. There are some adjacent postobstructive changes distal to the lesion in the periphery of the right upper lobe. Adjacent to the lesion there is extensive pleural thickening, with some effacement of the subpleural fat best appreciated on axial image 46 of series 3, which suggests early direct pleural invasion, potentially even early chest wall invasion. Sessile nodule in the posterior aspect of the left upper lobe abutting the major fissure measuring 10 x 4 mm (mean diameter of 7 mm), strongly favored to represent a benign subpleural lymph node. Ground-glass attenuation nodule in the periphery of the left lower lobe (axial image 80 of series 4), nonspecific. Small nodule in the anterior aspect of the left upper lobe also noted on the same image measuring 6 mm. Small calcified granuloma in the left lower lobe also incidentally noted. No acute consolidative airspace disease. No pleural effusions. Diffuse bronchial wall thickening with mild centrilobular and paraseptal emphysema. Upper Abdomen: Diffuse low attenuation throughout the visualized hepatic parenchyma, strongly suggestive of hepatic steatosis (difficult to say for certain on today's contrast enhanced examination). Status post cholecystectomy. Musculoskeletal: There are no aggressive appearing lytic or blastic lesions noted in the visualized portions of the skeleton. IMPRESSION: 1. 7.9 x 7.1 x 6.5 cm right upper lobe mass with evidence of probable direct chest wall invasion as well as right hilar and potentially right paratracheal  lymphadenopathy. Further evaluation with nonemergent PET-CT is recommended in the near future for staging purposes. 2. Aortic atherosclerosis, in addition to left anterior descending coronary artery disease. Please note that although the presence of coronary artery calcium documents the presence of coronary artery disease, the severity of this disease and any potential stenosis cannot be assessed on this non-gated CT examination. Assessment for potential risk factor modification, dietary therapy or pharmacologic therapy may be warranted, if clinically indicated. 3. Mild diffuse bronchial wall thickening with mild  centrilobular and paraseptal emphysema; imaging findings suggestive of underlying COPD. 4. Probable hepatic steatosis. 5. Additional incidental findings, as above. Aortic Atherosclerosis (ICD10-I70.0). Electronically Signed   By: Vinnie Langton M.D.   On: 03/03/2018 15:11   Nm Pet Image Initial (pi) Skull Base To Thigh  Result Date: 03/10/2018 CLINICAL DATA:  Initial treatment strategy for right upper lobe lung mass on chest CT. EXAM: NUCLEAR MEDICINE PET SKULL BASE TO THIGH TECHNIQUE: 11.8 mCi F-18 FDG was injected intravenously. Full-ring PET imaging was performed from the skull base to thigh after the radiotracer. CT data was obtained and used for attenuation correction and anatomic localization. Fasting blood glucose: 105 mg/dl COMPARISON:  Chest CT 03/03/2018 FINDINGS: Mediastinal blood pool activity: SUV max 3.3 NECK: A left deep parotid nodule measures 6 mm and a S.U.V. max of 4.9 on image 21/4. A right-sided level 2 node measures 7 mm and a S.U.V. max of 3.1 on image 29/4. Incidental CT findings: No cervical adenopathy. Mucous retention cysts or polyps in both maxillary sinuses. CHEST: Hypermetabolism corresponding to the right upper lobe lung mass with possible chest wall invasion. This measures 7.8 cm and a S.U.V. max of 22.6. Right hilar hypermetabolic node at a S.U.V. max of 6.5.  Incidental CT findings: Other findings deferred to recent diagnostic chest CT. No superimposed acute process. No focal osseous lesion. ABDOMEN/PELVIS: No abdominopelvic nodal or parenchymal hypermetabolism. Incidental CT findings: Cholecystectomy. Normal adrenal glands. Left common iliac artery stent. SKELETON: No abnormal marrow activity. Incidental CT findings: none IMPRESSION: 1. 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 Possible chest wall invasion, as on CT. 2. Isolated right level 2 small hypermetabolic lymph node, favored to be reactive. 3. Left parotid 6 mm hypermetabolic lesion is favored to represent a benign or malignant primary neoplasm. 4. No subdiaphragmatic metastatic disease identified. Electronically Signed   By: Abigail Miyamoto M.D.   On: 03/10/2018 17:52   Ct Biopsy  Result Date: 03/21/2018 INDICATION: Hypermetabolic right upper lobe pulmonary mass. Please perform CT-guided biopsy for tissue diagnostic purposes. EXAM: CT-GUIDED RIGHT UPPER LOBE MASS BIOPSY COMPARISON:  PET-CT-03/10/2018; chest CT-03/03/2018 MEDICATIONS: None. ANESTHESIA/SEDATION: Fentanyl 75 mcg IV; Versed 1.5 mg IV Sedation time: 60 minutes; The patient was continuously monitored during the procedure by the interventional radiology nurse under my direct supervision. CONTRAST:  None COMPLICATIONS: None immediate. PROCEDURE: Informed consent was obtained from the patient following an explanation of the procedure, risks, benefits and alternatives. The patient understands,agrees and consents for the procedure. All questions were addressed. A time out was performed prior to the initiation of the procedure. The patient was positioned supine on the CT table and a limited chest CT was performed for procedural planning demonstrating grossly unchanged size and appearance of macrolobulated approximately 8.1 x 7.0 cm right upper lobe mass (image 24, series 2). The operative site was prepped and  draped in the usual sterile fashion. Under sterile conditions and local anesthesia, a 17 gauge coaxial needle was advanced into the peripheral aspect of the nodule. Positioning was confirmed with intermittent CT fluoroscopy and followed by the acquisition of 3 core needle biopsies with an 18 gauge core needle biopsy device. The coaxial needle was removed following deployment of a [employer] plug and superficial hemostasis was achieved with manual compression. Limited post procedural chest CT was negative for pneumothorax or additional complication. A dressing was placed. The patient tolerated the procedure well without immediate postprocedural complication. The patient was escorted to have an upright chest radiograph. IMPRESSION: Technically  successful CT guided core needle core biopsy of hypermetabolic right upper lobe pulmonary mass. Electronically Signed   By: [name] M.D.   On: 03/21/2018 14:56   Dg Chest Port 1 View  Result Date: 03/21/2018 CLINICAL DATA:  Right upper lobe mass biopsy. EXAM: PORTABLE CHEST 1 VIEW COMPARISON:  Intraprocedural chest CT 03/21/2018 FINDINGS: The appearance of the right upper lobe mass is unchanged. There is no pneumothorax. No pleural effusion, focal airspace consolidation or pulmonary edema. Left chest wall single lead AICD is unchanged with lead tip projecting over the right ventricle. IMPRESSION: No pneumothorax, status post biopsy of right upper lobe mass. Electronically Signed   By: Ulyses Jarred M.D.   On: 03/21/2018 13:40    ASSESSMENT: This is a very pleasant 57 years old white male recently diagnosed with at least stage IIIa (T3, N2, M0) non-small cell lung cancer, pending final pathology report and histology subtype diagnosed in June 2019 and presented with large right upper lobe lung mass with questionable chest wall invasion as well as right hilar and mediastinal lymphadenopathy.   PLAN: I had a lengthy discussion with the patient and his wife today about  his current disease stage, prognosis and treatment options. I personally and independently reviewed the scan images and discussed the result and showed the images to the patient and his wife today. I will complete the staging work-up by ordering CT scan of the head with and without contrast to rule out brain metastasis.  The patient cannot get MRI of the brain because his AICD. I also discussed with the patient his treatment options and I recommended for him a course of concurrent chemoradiation with weekly carboplatin for AUC of 2 and paclitaxel 45 mg/M2.  This could be followed by evaluation for surgical resection versus consolidation immunotherapy with Imfinzi (Durvalumab). I discussed with the patient the adverse effect of the chemotherapy including but not limited to alopecia, myelosuppression, nausea and vomiting, peripheral neuropathy, liver or renal dysfunction. I will refer the patient to radiation oncology for evaluation and discussion of the radiotherapy option. I will arrange for the patient to have a chemotherapy education class before the first dose of his treatment. I will see the patient back for follow-up visit in 2 weeks with the start of the first cycle of his treatment. I will send prescription for Compazine 10 mg p.o. every 6 hours as needed for nausea to his pharmacy. The patient was advised to call immediately if he has any concerning symptoms in the interval. The patient voices understanding of current disease status and treatment options and is in agreement with the current care plan.  All questions were answered. The patient knows to call the clinic with any problems, questions or concerns. We can certainly see the patient much sooner if necessary.  Thank you so much for allowing me to participate in the care of [place]. I will continue to follow up the patient with you and assist in his care.  I spent 55 minutes counseling the patient face to face. The total time spent  in the appointment was 80 minutes.  Disclaimer: This note was dictated with voice recognition software. Similar sounding words can inadvertently be transcribed and may not be corrected upon review.   Eilleen Kempf March 22, 2018, 12:09 PM

## 2018-03-22 NOTE — Telephone Encounter (Signed)
Appointments scheduled AVS/Calendar printed/ Phone number was given to patient for [facility] for CT scheduling per 6/26 los

## 2018-03-22 NOTE — Progress Notes (Signed)
START ON PATHWAY REGIMEN - Non-Small Cell Lung     Administer weekly:     Paclitaxel      Carboplatin   **Always confirm dose/schedule in your pharmacy ordering system**  Patient Characteristics: Stage III - Unresectable, PS = 0, 1 AJCC T Category: T3 Current Disease Status: No Distant Mets or Local Recurrence AJCC N Category: N2 AJCC M Category: M0 AJCC 8 Stage Grouping: IIIB Performance Status: PS = 0, 1 Intent of Therapy: Curative Intent, Discussed with Patient

## 2018-03-23 ENCOUNTER — Encounter: Payer: Self-pay | Admitting: Radiation Oncology

## 2018-03-27 NOTE — Progress Notes (Signed)
Thoracic Location of Tumor / Histology: Non-small cell carcinoma of right lung, stage 3  Patient presented with symptoms of: mild cough, SOB with exertion   Chest x-ray 03/03/2018: Large right upper lobe mass measuring 8.0 x 6.7 cm.    CT Chest 03/03/2018: 7.9 x 7.1 x 6.5 cm right upper lobe mass with suspicious chest wall invasion as well as right hilar and questionable right paratracheal lymph node.  PET 01/12/4080: Hypermetabolic right upper lobe lung mass with possible chest wall invasion and it measures 7.8 cm with SUV max 22.6.  There was also right hilar metabolic node with SUV max 6.5.  There was a right sided level 2 node measuring 0.7 cm with SUV max 3.1.  There was also a left deep parotid nodule measuring 0.6 cm with SUV max 4.9.  Biopsies of Right Upper Lobe 03/21/2018   Tobacco/Marijuana/Snuff/ETOH use: Current smoker  Past/Anticipated interventions by cardiothoracic surgery, if any:   Past/Anticipated interventions by medical oncology, if any:  Dr. Julien Nordmann 03/22/2018 -I also discussed with the patient his treatment options and I recommended for him a course of concurrent chemoradiation with weekly carboplatin for AUC of 2 and paclitaxel 45 mg/M2.  This could be followed by evaluation for surgical resection versus consolidation immunotherapy with Imfinzi (Durvalumab). -I will see the patient back for follow-up visit in 2 weeks with the start of the first cycle of his treatment.  - CT head ordered but unscheduled at this time.    Signs/Symptoms  Weight changes, if any: None  Respiratory complaints, if any: None noted  Hemoptysis, if any: Does have productive cough with  Clear phlegm, no blood   Pain issues, if any:  No  BP 111/72 (BP Location: Right Arm, Patient Position: Sitting, Cuff Size: Normal)   Pulse 70   Temp 97.6 F (36.4 C) (Oral)   Resp 18   Ht 6' (1.829 m)   Wt 237 lb 3.2 oz (107.6 kg)   SpO2 97%   BMI 32.17 kg/m    Wt Readings from Last 3 Encounters:    03/28/18 237 lb 3.2 oz (107.6 kg)  03/22/18 237 lb 4.8 oz (107.6 kg)  03/21/18 240 lb (108.9 kg)    SAFETY ISSUES:  Prior radiation? No  Pacemaker/ICD? AICD placed 03/03/2018  Possible current pregnancy? No  Is the patient on methotrexate? 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

## 2018-03-28 ENCOUNTER — Ambulatory Visit
Admission: RE | Admit: 2018-03-28 | Discharge: 2018-03-28 | Disposition: A | Discharge status: Home / Self Care | Payer: Medicare Other | Source: Ambulatory Visit | Attending: Radiation Oncology | Admitting: Radiation Oncology

## 2018-03-28 ENCOUNTER — Encounter (HOSPITAL_COMMUNITY): Payer: Self-pay | Admitting: Internal Medicine

## 2018-03-28 ENCOUNTER — Encounter: Payer: Self-pay | Admitting: *Deleted

## 2018-03-28 ENCOUNTER — Encounter: Payer: Self-pay | Admitting: Radiation Oncology

## 2018-03-28 ENCOUNTER — Other Ambulatory Visit: Payer: Self-pay

## 2018-03-28 VITALS — BP 111/72 | HR 70 | Temp 97.6°F | Resp 18 | Ht 72.0 in | Wt 237.2 lb

## 2018-03-28 DIAGNOSIS — F419 Anxiety disorder, unspecified: Secondary | ICD-10-CM | POA: Insufficient documentation

## 2018-03-28 DIAGNOSIS — F17210 Nicotine dependence, cigarettes, uncomplicated: Secondary | ICD-10-CM | POA: Diagnosis not present

## 2018-03-28 DIAGNOSIS — Z1389 Encounter for screening for other disorder: Secondary | ICD-10-CM | POA: Diagnosis not present

## 2018-03-28 DIAGNOSIS — E7800 Pure hypercholesterolemia, unspecified: Secondary | ICD-10-CM | POA: Insufficient documentation

## 2018-03-28 DIAGNOSIS — F329 Major depressive disorder, single episode, unspecified: Secondary | ICD-10-CM | POA: Diagnosis not present

## 2018-03-28 DIAGNOSIS — I255 Ischemic cardiomyopathy: Secondary | ICD-10-CM | POA: Diagnosis not present

## 2018-03-28 DIAGNOSIS — Z888 Allergy status to other drugs, medicaments and biological substances status: Secondary | ICD-10-CM | POA: Diagnosis not present

## 2018-03-28 DIAGNOSIS — J45909 Unspecified asthma, uncomplicated: Secondary | ICD-10-CM | POA: Diagnosis not present

## 2018-03-28 DIAGNOSIS — Z7982 Long term (current) use of aspirin: Secondary | ICD-10-CM | POA: Diagnosis not present

## 2018-03-28 DIAGNOSIS — C3411 Malignant neoplasm of upper lobe, right bronchus or lung: Secondary | ICD-10-CM | POA: Diagnosis not present

## 2018-03-28 DIAGNOSIS — J449 Chronic obstructive pulmonary disease, unspecified: Secondary | ICD-10-CM | POA: Diagnosis not present

## 2018-03-28 DIAGNOSIS — Z95810 Presence of automatic (implantable) cardiac defibrillator: Secondary | ICD-10-CM | POA: Diagnosis not present

## 2018-03-28 DIAGNOSIS — G894 Chronic pain syndrome: Secondary | ICD-10-CM | POA: Diagnosis not present

## 2018-03-28 DIAGNOSIS — Z79899 Other long term (current) drug therapy: Secondary | ICD-10-CM | POA: Insufficient documentation

## 2018-03-28 DIAGNOSIS — Z510 Encounter for antineoplastic radiation therapy: Secondary | ICD-10-CM | POA: Insufficient documentation

## 2018-03-28 DIAGNOSIS — I252 Old myocardial infarction: Secondary | ICD-10-CM | POA: Diagnosis not present

## 2018-03-28 DIAGNOSIS — C3491 Malignant neoplasm of unspecified part of right bronchus or lung: Secondary | ICD-10-CM

## 2018-03-28 DIAGNOSIS — B182 Chronic viral hepatitis C: Secondary | ICD-10-CM | POA: Insufficient documentation

## 2018-03-28 DIAGNOSIS — Z0000 Encounter for general adult medical examination without abnormal findings: Secondary | ICD-10-CM | POA: Diagnosis not present

## 2018-03-28 DIAGNOSIS — Z0001 Encounter for general adult medical examination with abnormal findings: Secondary | ICD-10-CM | POA: Diagnosis not present

## 2018-03-28 NOTE — Progress Notes (Signed)
Oncology Nurse Navigator Documentation  Oncology Nurse Navigator Flowsheets 03/28/2018  Navigator Location [facility]  Navigator Encounter Type Other/per [facility] results, there is not enough tissue for testing. I will update Dr. Julien Nordmann.   Treatment Phase Pre-Tx/Tx Discussion  Barriers/Navigation Needs Coordination of Care  Interventions Coordination of Care  Coordination of Care Other  Acuity Level 2  Time Spent with Patient 15

## 2018-03-28 NOTE — Progress Notes (Signed)
Radiation Oncology         [phone] ________________________________  Name: RENDELL Costa        MRN: [id number]  Date of Service: 03/28/2018 DOB: November 18, 1960  CC:Redmond School, MD  Curt Bears, MD     REFERRING PHYSICIAN: Curt Bears, MD   DIAGNOSIS: The encounter diagnosis was Non-small cell carcinoma of right lung, stage 3 ([place]).   HISTORY OF PRESENT ILLNESS: [name] is a 57 y.o. male seen at the request of Dr. Julien Nordmann for a new diagnosis of right lung cancer.  The patient role with cardio defibrillator and was taken to the OR for this procedure on 03/02/2018.  He is post op imaging to rule out pneumothorax revealed a mass in the right upper lobe.  He underwent CT on 03/03/2018 which revealed a 7.9 x 7.1 by 6.5 cm mass in the right upper lobe with concerns for chest wall invasion.  There were concerns for borderline enlarged right paratracheal and right hilar nodes.  He underwent PET scan on 03/10/2018 which revealed a right upper lobe primary mass with hypermetabolic activity and concerns for nodal disease in the right hilum and possible chest wall invasion. Small hypermetabolic nodes were noted in the right at level 2, and a left parotid hypermetabolic lesion was noted.  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  He is met with Dr. Julien Nordmann and has plans to begin chemo RT on Monday next week.  He comes today to discuss options of treatment.  PREVIOUS RADIATION THERAPY: No   PAST MEDICAL HISTORY:  Past Medical History:  Diagnosis Date  . 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 Anxiety   . Asthma   . Depression   . DVT (deep venous thrombosis) ([place]) 2009; 2019   RLE; LLE  . Hepatitis C    "tx'd in 2015"  . High cholesterol   . STEMI (ST elevation myocardial infarction) ([place]) 05/26/2016  . Tobacco abuse 01/28/2016  . Urinary dribbling         PAST SURGICAL HISTORY: Past Surgical History:  Procedure Laterality Date  . AORTOGRAM  08/08/2009   for LLE claudication     By Dr. Oneida Alar  . 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 BIOPSY N/A 05/30/2014   Procedure: BIOPSY;  Surgeon: [name], MD;  Location: [facility];  Service: Endoscopy;  Laterality: N/A;  . BLADDER TUMOR EXCISION  8/812  . CARDIAC CATHETERIZATION N/A 05/26/2016   Procedure: Left Heart Cath and Coronary Angiography;  Surgeon: [name], MD;  Location: [place] [facility];  Service: Cardiovascular;  Laterality: N/A;  . CARDIAC CATHETERIZATION N/A 05/26/2016   Procedure: Coronary Stent Intervention;  Surgeon: [name], MD;  Location: [place] [facility];  Service: Cardiovascular;  Laterality: N/A;  . COLONOSCOPY WITH PROPOFOL N/A 05/30/2014   XTG:GYIRSW colonic polyp-likely source of hematochezia-removed as described above  . ESOPHAGOGASTRODUODENOSCOPY (EGD) WITH PROPOFOL N/A 05/30/2014   NIO:EVOJJK and bulbar erosions s/p gastric biopsy. No evidence of portal gastropathy on today's examination.  . FEMORAL-TIBIAL BYPASS GRAFT  2009   Right side using non-reversed GSV   By Dr. Oneida Alar  . FEMORAL-TIBIAL BYPASS GRAFT  11/24/2007   Right femoral to anterior tibial BPG   by Dr. Oneida Alar  . FINGER SURGERY Left    "straightened my pinky"  . HAND TENDON SURGERY Left 2013   Left 5th finger  .  HERNIA REPAIR     "stomach"  . ICD IMPLANT N/A 03/02/2018   Procedure: ICD IMPLANT;  Surgeon: [name], MD;  Location: [place] [facility];  Service: Cardiovascular;  Laterality: N/A;  . INCISIONAL HERNIA REPAIR N/A 12/25/2014   Procedure: HERNIA REPAIR INCISIONAL WITH MESH;  Surgeon: [name], MD;  Location: [facility];  Service: General;  Laterality: N/A;  . INSERTION OF MESH N/A 12/25/2014   Procedure: INSERTION OF MESH;  Surgeon: [name], MD;  Location: [facility];  Service: General;  Laterality: N/A;  . LAPAROSCOPIC CHOLECYSTECTOMY    . LOWER EXTREMITY  ANGIOGRAM Left 12/30/2015   Procedure: Lower Extremity Angiogram;  Surgeon: [name], MD;  Location: [place];  Service: Cardiovascular;  Laterality: Left;  . PERIPHERAL VASCULAR CATHETERIZATION N/A 12/30/2015   Procedure: Abdominal Aortogram;  Surgeon: [name], MD;  Location: [place] [facility];  Service: Cardiovascular;  Laterality: N/A;  . POLYPECTOMY  05/30/2014   Procedure: POLYPECTOMY;  Surgeon: [name], MD;  Location: [facility];  Service: Endoscopy;;  . TONSILLECTOMY AND ADENOIDECTOMY  ~ 1970  . TYMPANOSTOMY TUBE PLACEMENT Bilateral ~ 1970     FAMILY HISTORY:  Family History  Problem Relation Age of Onset  . Vision loss Mother   . Ulcers Father   . Colon cancer Neg Hx      SOCIAL HISTORY:  reports that he has been smoking cigarettes.  He has a 15.50 pack-year smoking history. He has never used smokeless tobacco. He reports that he drinks about 10.8 oz of alcohol per week. He reports that he has current or past drug history. Drug: Marijuana.  The patient is married and resides in [place].  He is accompanied by his wife and brother.   ALLERGIES: Lyrica [pregabalin] and Neurontin [gabapentin]   MEDICATIONS:  Current Outpatient Medications  Medication Sig Dispense Refill  . alprazolam (XANAX) 2 MG tablet Take 2 mg by mouth 4 (four) times daily as needed for anxiety.     [name] amitriptyline (ELAVIL) 100 MG tablet Take 100 mg by mouth at bedtime.     [name] aspirin 81 MG EC tablet Take 81 mg by mouth daily.      [name] atorvastatin (LIPITOR) 80 MG tablet Take 1 tablet (80 mg total) by mouth daily at 6 PM. 30 tablet 12  . carvedilol (COREG) 6.25 MG tablet TAKE 1 TABLET BY MOUTH TWICE A DAY WITH A MEAL 60 tablet 6  . citalopram (CELEXA) 40 MG tablet Take 40 mg by mouth daily.  11  . HYDROcodone-acetaminophen (NORCO) 10-325 MG tablet Take 1 tablet by mouth every 4 (four) hours as needed for moderate pain.   0  . lisinopril (PRINIVIL,ZESTRIL) 2.5 MG tablet Take 1 tablet  (2.5 mg total) by mouth daily. 30 tablet 12  . nitroGLYCERIN (NITROSTAT) 0.4 MG SL tablet Place 1 tablet (0.4 mg total) under the tongue every 5 (five) minutes x 3 doses as needed for chest pain. 25 tablet 2  . prochlorperazine (COMPAZINE) 10 MG tablet Take 1 tablet (10 mg total) by mouth every 6 (six) hours as needed for nausea or vomiting. 30 tablet 0  . spironolactone (ALDACTONE) 25 MG tablet TAKE 1 TABLET BY MOUTH EVERY DAY 90 tablet 3  . tamsulosin (FLOMAX) 0.4 MG CAPS capsule Take 1 capsule by mouth daily.     No current facility-administered medications for this encounter.      REVIEW OF SYSTEMS: On review of systems, the patient  reports that he is doing well overall. He denies any chest pain, shortness of breath, cough, fevers, chills, night sweats, unintended weight changes. He denies any bowel or bladder disturbances, and denies abdominal pain, nausea or vomiting. He denies any new musculoskeletal or joint aches or pains. A complete review of systems is obtained and is otherwise negative.     PHYSICAL EXAM:  Wt Readings from Last 3 Encounters:  03/28/18 237 lb 3.2 oz (107.6 kg)  03/22/18 237 lb 4.8 oz (107.6 kg)  03/21/18 240 lb (108.9 kg)   Temp Readings from Last 3 Encounters:  03/28/18 97.6 F (36.4 C) (Oral)  03/22/18 97.9 F (36.6 C) (Oral)  03/21/18 98 F (36.7 C) (Oral)   BP Readings from Last 3 Encounters:  03/28/18 111/72  03/22/18 115/69  03/21/18 97/63   Pulse Readings from Last 3 Encounters:  03/28/18 70  03/22/18 76  03/21/18 (!) 57   Pain Assessment Pain Score: 0-No pain/10  In general this is a well appearing Caucasian male in no acute distress.  He is alert and oriented x4 and appropriate throughout the examination. HEENT reveals that the patient is normocephalic, atraumatic. EOMs are intact.  Skin is intact without any evidence of gross lesions. Cardiovascular exam reveals a regular rate and rhythm, no clicks rubs or murmurs are auscultated. Chest  is clear to auscultation bilaterally. Lymphatic assessment is performed and does not reveal any adenopathy in the cervical, supraclavicular, axillary, or inguinal chains. Abdomen has active bowel sounds in all quadrants and is intact. The abdomen is soft, non tender, non distended. Lower extremities are evaluated, he has a right BKA, the left lower extremity is negative for pretibial pitting edema, deep calf tenderness, cyanosis or clubbing.   ECOG = 0  0 - Asymptomatic (Fully active, able to carry on all predisease activities without restriction)  1 - Symptomatic but completely ambulatory (Restricted in physically strenuous activity but ambulatory and able to carry out work of a light or sedentary nature. For example, light housework, office work)  2 - Symptomatic, <50% in bed during the day (Ambulatory and capable of all self care but unable to carry out any work activities. Up and about more than 50% of waking hours)  3 - Symptomatic, >50% in bed, but not bedbound (Capable of only limited self-care, confined to bed or chair 50% or more of waking hours)  4 - Bedbound (Completely disabled. Cannot carry on any self-care. Totally confined to bed or chair)  5 - Death   Eustace Pen MM, Creech RH, Tormey DC, et al. [phone]). "Toxicity and response criteria of the [facility]". [place] Oncol. 5 (6): 649-55    LABORATORY DATA:  Lab Results  Component Value Date   WBC 9.9 03/21/2018   HGB 14.5 03/21/2018   HCT 45.8 03/21/2018   MCV 94.6 03/21/2018   PLT 284 03/21/2018   Lab Results  Component Value Date   NA 139 02/27/2018   K 4.8 02/27/2018   CL 98 02/27/2018   CO2 25 02/27/2018   Lab Results  Component Value Date   ALT 30 01/31/2018   AST 26 01/31/2018   ALKPHOS 128 (H) 01/31/2018   BILITOT 0.4 01/31/2018      RADIOGRAPHY: Dg Chest 2 View  Result Date: 03/03/2018 CLINICAL DATA:  Pacemaker placement, history former smoker, asthma, MI, ischemic  cardiomyopathy, chronic hepatitis-C EXAM: CHEST - 2 VIEW COMPARISON:  04/27/2011 FINDINGS: LEFT subclavian AICD lead tip projects at RIGHT ventricle. Normal heart  size, mediastinal contours and pulmonary vascularity. Large RIGHT upper lobe mass question neoplasm, 8.0 x 6.7 cm, new since 2012. Remaining lungs clear. No pulmonary infiltrate, pleural effusion or pneumothorax. Bones unremarkable. IMPRESSION: No pneumothorax following LEFT subclavian AICD placement. Large RIGHT upper lobe mass 8.0 x 6.7 cm highly suspicious for a pulmonary neoplasm; CT chest with contrast recommended for further evaluation. Findings called to [place] on [facility] on 03/03/2018 at 0915 hours. Electronically Signed   By: Lavonia Dana M.D.   On: 03/03/2018 09:16   Ct Chest W Contrast  Result Date: 03/03/2018 CLINICAL DATA:  57 year old male with history of abnormal chest x-ray suspicious for lung cancer. Follow-up study. EXAM: CT CHEST WITH CONTRAST TECHNIQUE: Multidetector CT imaging of the chest was performed during intravenous contrast administration. CONTRAST:  11m OMNIPAQUE IOHEXOL 300 MG/ML  SOLN COMPARISON:  Chest CT 04/05/2008. FINDINGS: Cardiovascular: Heart size is normal. There is no significant pericardial fluid, thickening or pericardial calcification. There is aortic atherosclerosis, as well as atherosclerosis of the great vessels of the mediastinum and the coronary arteries, including calcified atherosclerotic plaque in the left anterior descending coronary arteries. Status post PTCI to the LAD. Mild aneurysmal dilatation of the left ventricular apical myocardium presumably from prior distal LAD territory myocardial infarction. Left-sided pacemaker/AICD device in place with lead tip terminating in the right ventricular apex. Mediastinum/Nodes: Enlarged right hilar lymph nodes measuring up to 15 mm in short axis. Multiple other borderline enlarged right paratracheal lymph nodes are noted, nonspecific, but certainly  suspicious given the large right upper lobe mass. No contralateral mediastinal or hilar lymphadenopathy. Esophagus is unremarkable in appearance. No supraclavicular lymphadenopathy. No axillary lymphadenopathy. Lungs/Pleura: In the right upper lobe there is a large macrolobulated mass with slightly spiculated margins which measures 7.9 x 7.1 x 6.5 cm, which abuts the pleural surface superiorly and laterally, and makes contact with the superior aspect of the left major fissure which is tethered to the lesion. This lesion does not appear to cross the major fissure at this time. There are some adjacent postobstructive changes distal to the lesion in the periphery of the right upper lobe. Adjacent to the lesion there is extensive pleural thickening, with some effacement of the subpleural fat best appreciated on axial image 46 of series 3, which suggests early direct pleural invasion, potentially even early chest wall invasion. Sessile nodule in the posterior aspect of the left upper lobe abutting the major fissure measuring 10 x 4 mm (mean diameter of 7 mm), strongly favored to represent a benign subpleural lymph node. Ground-glass attenuation nodule in the periphery of the left lower lobe (axial image 80 of series 4), nonspecific. Small nodule in the anterior aspect of the left upper lobe also noted on the same image measuring 6 mm. Small calcified granuloma in the left lower lobe also incidentally noted. No acute consolidative airspace disease. No pleural effusions. Diffuse bronchial wall thickening with mild centrilobular and paraseptal emphysema. Upper Abdomen: Diffuse low attenuation throughout the visualized hepatic parenchyma, strongly suggestive of hepatic steatosis (difficult to say for certain on today's contrast enhanced examination). Status post cholecystectomy. Musculoskeletal: There are no aggressive appearing lytic or blastic lesions noted in the visualized portions of the skeleton. IMPRESSION: 1. 7.9 x  7.1 x 6.5 cm right upper lobe mass with evidence of probable direct chest wall invasion as well as right hilar and potentially right paratracheal lymphadenopathy. Further evaluation with nonemergent PET-CT is recommended in the near future for staging purposes. 2. Aortic atherosclerosis, in addition to  left anterior descending coronary artery disease. Please note that although the presence of coronary artery calcium documents the presence of coronary artery disease, the severity of this disease and any potential stenosis cannot be assessed on this non-gated CT examination. Assessment for potential risk factor modification, dietary therapy or pharmacologic therapy may be warranted, if clinically indicated. 3. Mild diffuse bronchial wall thickening with mild centrilobular and paraseptal emphysema; imaging findings suggestive of underlying COPD. 4. Probable hepatic steatosis. 5. Additional incidental findings, as above. Aortic Atherosclerosis (ICD10-I70.0). Electronically Signed   By: Vinnie Langton M.D.   On: 03/03/2018 15:11   Nm Pet Image Initial (pi) Skull Base To Thigh  Result Date: 03/10/2018 CLINICAL DATA:  Initial treatment strategy for right upper lobe lung mass on chest CT. EXAM: NUCLEAR MEDICINE PET SKULL BASE TO THIGH TECHNIQUE: 11.8 mCi F-18 FDG was injected intravenously. Full-ring PET imaging was performed from the skull base to thigh after the radiotracer. CT data was obtained and used for attenuation correction and anatomic localization. Fasting blood glucose: 105 mg/dl COMPARISON:  Chest CT 03/03/2018 FINDINGS: Mediastinal blood pool activity: SUV max 3.3 NECK: A left deep parotid nodule measures 6 mm and a S.U.V. max of 4.9 on image 21/4. A right-sided level 2 node measures 7 mm and a S.U.V. max of 3.1 on image 29/4. Incidental CT findings: No cervical adenopathy. Mucous retention cysts or polyps in both maxillary sinuses. CHEST: Hypermetabolism corresponding to the right upper lobe lung mass  with possible chest wall invasion. This measures 7.8 cm and a S.U.V. max of 22.6. Right hilar hypermetabolic node at a S.U.V. max of 6.5. Incidental CT findings: Other findings deferred to recent diagnostic chest CT. No superimposed acute process. No focal osseous lesion. ABDOMEN/PELVIS: No abdominopelvic nodal or parenchymal hypermetabolism. Incidental CT findings: Cholecystectomy. Normal adrenal glands. Left common iliac artery stent. SKELETON: No abnormal marrow activity. Incidental CT findings: none IMPRESSION: 1. 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 Possible chest wall invasion, as on CT. 2. Isolated right level 2 small hypermetabolic lymph node, favored to be reactive. 3. Left parotid 6 mm hypermetabolic lesion is favored to represent a benign or malignant primary neoplasm. 4. No subdiaphragmatic metastatic disease identified. Electronically Signed   By: Abigail Miyamoto M.D.   On: 03/10/2018 17:52   Ct Biopsy  Result Date: 03/21/2018 INDICATION: Hypermetabolic right upper lobe pulmonary mass. Please perform CT-guided biopsy for tissue diagnostic purposes. EXAM: CT-GUIDED RIGHT UPPER LOBE MASS BIOPSY COMPARISON:  PET-CT-03/10/2018; chest CT-03/03/2018 MEDICATIONS: None. ANESTHESIA/SEDATION: Fentanyl 75 mcg IV; Versed 1.5 mg IV Sedation time: 60 minutes; The patient was continuously monitored during the procedure by the interventional radiology nurse under my direct supervision. CONTRAST:  None COMPLICATIONS: None immediate. PROCEDURE: Informed consent was obtained from the patient following an explanation of the procedure, risks, benefits and alternatives. The patient understands,agrees and consents for the procedure. All questions were addressed. A time out was performed prior to the initiation of the procedure. The patient was positioned supine on the CT table and a limited chest CT was performed for procedural planning demonstrating grossly unchanged size and  appearance of macrolobulated approximately 8.1 x 7.0 cm right upper lobe mass (image 24, series 2). The operative site was prepped and draped in the usual sterile fashion. Under sterile conditions and local anesthesia, a 17 gauge coaxial needle was advanced into the peripheral aspect of the nodule. Positioning was confirmed with intermittent CT fluoroscopy and followed by the acquisition of 3 core needle biopsies  with an 18 gauge core needle biopsy device. The coaxial needle was removed following deployment of a [employer] plug and superficial hemostasis was achieved with manual compression. Limited post procedural chest CT was negative for pneumothorax or additional complication. A dressing was placed. The patient tolerated the procedure well without immediate postprocedural complication. The patient was escorted to have an upright chest radiograph. IMPRESSION: Technically successful CT guided core needle core biopsy of hypermetabolic right upper lobe pulmonary mass. Electronically Signed   By: [name] M.D.   On: 03/21/2018 14:56   Dg Chest Port 1 View  Result Date: 03/21/2018 CLINICAL DATA:  Right upper lobe mass biopsy. EXAM: PORTABLE CHEST 1 VIEW COMPARISON:  Intraprocedural chest CT 03/21/2018 FINDINGS: The appearance of the right upper lobe mass is unchanged. There is no pneumothorax. No pleural effusion, focal airspace consolidation or pulmonary edema. Left chest wall single lead AICD is unchanged with lead tip projecting over the right ventricle. IMPRESSION: No pneumothorax, status post biopsy of right upper lobe mass. Electronically Signed   By: Ulyses Jarred M.D.   On: 03/21/2018 13:40       IMPRESSION/PLAN: 1. Stage IIIa, cT3N2M0, NSCLC, adenosquamous carcinoma of the RUL. Dr. Lisbeth Renshaw discusses the pathology findings and reviews the nature of NSCLC.  We discussed the utility of chemoradiotherapy, and also agree with proceeding with CT imaging of the brain for staging purposes.  We discussed the  risks, benefits, short, and long term effects of radiotherapy, and the patient is interested in proceeding. Dr. Lisbeth Renshaw discusses the delivery and logistics of radiotherapy and anticipates a course of 6 1/2 weeks of radiotherapy. Written consent is obtained and placed in the chart, a copy was provided to the patient.  The patient will simulate to appointment.  The above documentation reflects my direct findings during this shared patient visit. Please see the separate note by Dr. Lisbeth Renshaw on this date for the remainder of the patient's plan of care.    Carola Rhine, PAC

## 2018-03-31 ENCOUNTER — Ambulatory Visit (HOSPITAL_COMMUNITY)
Admission: RE | Admit: 2018-03-31 | Discharge: 2018-03-31 | Disposition: A | Discharge status: Home / Self Care | Payer: Medicare Other | Source: Ambulatory Visit | Attending: Internal Medicine | Admitting: Internal Medicine

## 2018-03-31 ENCOUNTER — Inpatient Hospital Stay: Payer: Medicare Other | Attending: Internal Medicine

## 2018-03-31 ENCOUNTER — Encounter (HOSPITAL_COMMUNITY): Payer: Self-pay | Admitting: Radiology

## 2018-03-31 DIAGNOSIS — Z5111 Encounter for antineoplastic chemotherapy: Secondary | ICD-10-CM | POA: Insufficient documentation

## 2018-03-31 DIAGNOSIS — C3411 Malignant neoplasm of upper lobe, right bronchus or lung: Secondary | ICD-10-CM | POA: Insufficient documentation

## 2018-03-31 DIAGNOSIS — C3490 Malignant neoplasm of unspecified part of unspecified bronchus or lung: Secondary | ICD-10-CM

## 2018-03-31 DIAGNOSIS — C3491 Malignant neoplasm of unspecified part of right bronchus or lung: Secondary | ICD-10-CM | POA: Diagnosis not present

## 2018-03-31 DIAGNOSIS — Z510 Encounter for antineoplastic radiation therapy: Secondary | ICD-10-CM | POA: Diagnosis not present

## 2018-03-31 DIAGNOSIS — K5900 Constipation, unspecified: Secondary | ICD-10-CM | POA: Insufficient documentation

## 2018-03-31 DIAGNOSIS — R1310 Dysphagia, unspecified: Secondary | ICD-10-CM | POA: Insufficient documentation

## 2018-03-31 MED ORDER — IOPAMIDOL (ISOVUE-300) INJECTION 61%
75.0000 mL | Freq: Once | INTRAVENOUS | Status: AC | PRN
  Administered 2018-03-31: 75 mL via INTRAVENOUS

## 2018-03-31 MED ORDER — IOPAMIDOL (ISOVUE-300) INJECTION 61%
INTRAVENOUS | Status: AC
  Filled 2018-03-31: qty 100

## 2018-03-31 NOTE — Progress Notes (Signed)
Pacemaker/ICD form re-faxed to [facility] group attention PA [place].  Awaiting return fax.  Will continue to follow as necessary.  Gloriajean Dell. Leonie Green, BSN

## 2018-04-03 ENCOUNTER — Inpatient Hospital Stay: Payer: Medicare Other | Admitting: *Deleted

## 2018-04-03 ENCOUNTER — Encounter: Payer: Self-pay | Admitting: Nurse Practitioner

## 2018-04-03 ENCOUNTER — Inpatient Hospital Stay (HOSPITAL_BASED_OUTPATIENT_CLINIC_OR_DEPARTMENT_OTHER): Payer: Medicare Other | Admitting: Nurse Practitioner

## 2018-04-03 ENCOUNTER — Inpatient Hospital Stay: Payer: Medicare Other

## 2018-04-03 ENCOUNTER — Encounter: Payer: Self-pay | Admitting: *Deleted

## 2018-04-03 ENCOUNTER — Encounter: Payer: Self-pay | Admitting: Internal Medicine

## 2018-04-03 ENCOUNTER — Ambulatory Visit
Admission: RE | Admit: 2018-04-03 | Discharge: 2018-04-03 | Disposition: A | Discharge status: Home / Self Care | Payer: Medicare Other | Source: Ambulatory Visit | Attending: Radiation Oncology | Admitting: Radiation Oncology

## 2018-04-03 VITALS — BP 112/70 | HR 65 | Temp 97.9°F | Resp 19

## 2018-04-03 VITALS — BP 126/78 | HR 77 | Temp 97.9°F | Resp 18 | Ht 72.0 in | Wt 233.6 lb

## 2018-04-03 DIAGNOSIS — Z5111 Encounter for antineoplastic chemotherapy: Secondary | ICD-10-CM | POA: Diagnosis not present

## 2018-04-03 DIAGNOSIS — C3411 Malignant neoplasm of upper lobe, right bronchus or lung: Secondary | ICD-10-CM

## 2018-04-03 DIAGNOSIS — K119 Disease of salivary gland, unspecified: Secondary | ICD-10-CM | POA: Diagnosis not present

## 2018-04-03 DIAGNOSIS — Z720 Tobacco use: Secondary | ICD-10-CM

## 2018-04-03 DIAGNOSIS — Z1211 Encounter for screening for malignant neoplasm of colon: Secondary | ICD-10-CM

## 2018-04-03 DIAGNOSIS — C3491 Malignant neoplasm of unspecified part of right bronchus or lung: Secondary | ICD-10-CM

## 2018-04-03 DIAGNOSIS — R0609 Other forms of dyspnea: Secondary | ICD-10-CM

## 2018-04-03 DIAGNOSIS — K5900 Constipation, unspecified: Secondary | ICD-10-CM | POA: Diagnosis not present

## 2018-04-03 DIAGNOSIS — R1310 Dysphagia, unspecified: Secondary | ICD-10-CM | POA: Diagnosis not present

## 2018-04-03 LAB — CBC WITH DIFFERENTIAL (CANCER CENTER ONLY)
BASOS PCT: 1 %
Basophils Absolute: 0.1 10*3/uL (ref 0.0–0.1)
Eosinophils Absolute: 0.9 10*3/uL — ABNORMAL HIGH (ref 0.0–0.5)
Eosinophils Relative: 8 %
HCT: 46.5 % (ref 38.4–49.9)
HEMOGLOBIN: 15.3 g/dL (ref 13.0–17.1)
LYMPHS ABS: 2 10*3/uL (ref 0.9–3.3)
Lymphocytes Relative: 16 %
MCH: 30.8 pg (ref 27.2–33.4)
MCHC: 32.9 g/dL (ref 32.0–36.0)
MCV: 93.6 fL (ref 79.3–98.0)
Monocytes Absolute: 1.2 10*3/uL — ABNORMAL HIGH (ref 0.1–0.9)
Monocytes Relative: 10 %
NEUTROS PCT: 65 %
Neutro Abs: 8.3 10*3/uL — ABNORMAL HIGH (ref 1.5–6.5)
Platelet Count: 277 10*3/uL (ref 140–400)
RBC: 4.97 MIL/uL (ref 4.20–5.82)
RDW: 14.5 % (ref 11.0–14.6)
WBC: 12.6 10*3/uL — AB (ref 4.0–10.3)

## 2018-04-03 LAB — CMP (CANCER CENTER ONLY)
ALT: 36 U/L (ref 0–44)
ANION GAP: 7 (ref 5–15)
AST: 24 U/L (ref 15–41)
Albumin: 4.4 g/dL (ref 3.5–5.0)
Alkaline Phosphatase: 144 U/L — ABNORMAL HIGH (ref 38–126)
BUN: 10 mg/dL (ref 6–20)
CALCIUM: 9.7 mg/dL (ref 8.9–10.3)
CHLORIDE: 101 mmol/L (ref 98–111)
CO2: 29 mmol/L (ref 22–32)
CREATININE: 0.96 mg/dL (ref 0.61–1.24)
Glucose, Bld: 87 mg/dL (ref 70–99)
Potassium: 4.2 mmol/L (ref 3.5–5.1)
Sodium: 137 mmol/L (ref 135–145)
Total Bilirubin: 0.3 mg/dL (ref 0.3–1.2)
Total Protein: 7.8 g/dL (ref 6.5–8.1)

## 2018-04-03 MED ORDER — PALONOSETRON HCL INJECTION 0.25 MG/5ML
INTRAVENOUS | Status: AC
Start: 2018-04-03 — End: ?
  Filled 2018-04-03: qty 5

## 2018-04-03 MED ORDER — PALONOSETRON HCL INJECTION 0.25 MG/5ML
0.2500 mg | Freq: Once | INTRAVENOUS | Status: AC
  Administered 2018-04-03: 0.25 mg via INTRAVENOUS

## 2018-04-03 MED ORDER — DIPHENHYDRAMINE HCL 50 MG/ML IJ SOLN
INTRAMUSCULAR | Status: AC
Start: 2018-04-03 — End: ?
  Filled 2018-04-03: qty 1

## 2018-04-03 MED ORDER — SODIUM CHLORIDE 0.9 % IV SOLN
45.0000 mg/m2 | Freq: Once | INTRAVENOUS | Status: AC
  Administered 2018-04-03: 108 mg via INTRAVENOUS
  Filled 2018-04-03: qty 18

## 2018-04-03 MED ORDER — SODIUM CHLORIDE 0.9 % IV SOLN
Freq: Once | INTRAVENOUS | Status: AC
  Administered 2018-04-03: 15:00:00 via INTRAVENOUS

## 2018-04-03 MED ORDER — DIPHENHYDRAMINE HCL 50 MG/ML IJ SOLN
50.0000 mg | Freq: Once | INTRAMUSCULAR | Status: AC
  Administered 2018-04-03: 50 mg via INTRAVENOUS

## 2018-04-03 MED ORDER — FAMOTIDINE IN NACL 20-0.9 MG/50ML-% IV SOLN
20.0000 mg | Freq: Once | INTRAVENOUS | Status: AC
  Administered 2018-04-03: 20 mg via INTRAVENOUS

## 2018-04-03 MED ORDER — DEXAMETHASONE SODIUM PHOSPHATE 10 MG/ML IJ SOLN
INTRAMUSCULAR | Status: AC
  Filled 2018-04-03: qty 1

## 2018-04-03 MED ORDER — FAMOTIDINE IN NACL 20-0.9 MG/50ML-% IV SOLN
INTRAVENOUS | Status: AC
  Filled 2018-04-03: qty 50

## 2018-04-03 MED ORDER — SODIUM CHLORIDE 0.9 % IV SOLN
300.0000 mg | Freq: Once | INTRAVENOUS | Status: AC
  Administered 2018-04-03: 300 mg via INTRAVENOUS
  Filled 2018-04-03: qty 30

## 2018-04-03 MED ORDER — DEXAMETHASONE SODIUM PHOSPHATE 100 MG/10ML IJ SOLN
20.0000 mg | Freq: Once | INTRAMUSCULAR | Status: AC
  Administered 2018-04-03: 20 mg via INTRAVENOUS
  Filled 2018-04-03: qty 2

## 2018-04-03 NOTE — Progress Notes (Signed)
Oncology Nurse Navigator Documentation  Oncology Nurse Navigator Flowsheets 04/03/2018  Navigator Location [facility]  Navigator Encounter Type Clinic/MDC/per Dr. Julien Nordmann, I called [facility] to check on tissue results.  I was told there was marginal tissue but they believed they can run molecular testing.  Dr. Julien Nordmann updated and they will continue to run testing.   Patient Visit Type MedOnc  Treatment Phase Pre-Tx/Tx Discussion  Barriers/Navigation Needs Coordination of Care  Interventions Coordination of Care  Coordination of Care Other  Acuity Level 2  Time Spent with Patient 30

## 2018-04-03 NOTE — Progress Notes (Signed)
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  Unfortunately there aren't any foundations offering copay assistance for his Dx.  I offered the [employer], went over what it covers and gave him an expense sheet.  Pt would like to apply so he will bring proof of income on 04/04/18.  He has my card for any questions or concerns he may have in the future.

## 2018-04-03 NOTE — Patient Instructions (Signed)
[facility] Discharge Instructions for Patients Receiving Chemotherapy  Today you received the following chemotherapy agents Taxol and Carboplatin  To help prevent nausea and vomiting after your treatment, we encourage you to take your nausea medication as prescribed.   If you develop nausea and vomiting that is not controlled by your nausea medication, call the clinic.   BELOW ARE SYMPTOMS THAT SHOULD BE REPORTED IMMEDIATELY:  *FEVER GREATER THAN 100.5 F  *CHILLS WITH OR WITHOUT FEVER  NAUSEA AND VOMITING THAT IS NOT CONTROLLED WITH YOUR NAUSEA MEDICATION  *UNUSUAL SHORTNESS OF BREATH  *UNUSUAL BRUISING OR BLEEDING  TENDERNESS IN MOUTH AND THROAT WITH OR WITHOUT PRESENCE OF ULCERS  *URINARY PROBLEMS  *BOWEL PROBLEMS  UNUSUAL RASH Items with * indicate a potential emergency and should be followed up as soon as possible.  Feel free to call the clinic should you have any questions or concerns. The clinic phone number is [phone].  Please show the [facility] at check-in to the Emergency Department and triage nurse.  Paclitaxel injection (taxol) What is this medicine? PACLITAXEL (PAK li TAX el) is a chemotherapy drug. It targets fast dividing cells, like cancer cells, and causes these cells to die. This medicine is used to treat ovarian cancer, breast cancer, and other cancers. This medicine may be used for other purposes; ask your health care provider or pharmacist if you have questions. COMMON BRAND NAME(S): Onxol, Taxol What should I tell my health care provider before I take this medicine? They need to know if you have any of these conditions: -blood disorders -irregular heartbeat -infection (especially a virus infection such as chickenpox, cold sores, or herpes) -liver disease -previous or ongoing radiation therapy -an unusual or allergic reaction to paclitaxel, alcohol, polyoxyethylated castor oil, other chemotherapy agents, other medicines,  foods, dyes, or preservatives -pregnant or trying to get pregnant -breast-feeding How should I use this medicine? This drug is given as an infusion into a vein. It is administered in a hospital or clinic by a specially trained health care professional. Talk to your pediatrician regarding the use of this medicine in children. Special care may be needed. Overdosage: If you think you have taken too much of this medicine contact a poison control center or emergency room at once. NOTE: This medicine is only for you. Do not share this medicine with others. What if I miss a dose? It is important not to miss your dose. Call your doctor or health care professional if you are unable to keep an appointment. What may interact with this medicine? Do not take this medicine with any of the following medications: -disulfiram -metronidazole This medicine may also interact with the following medications: -cyclosporine -diazepam -ketoconazole -medicines to increase blood counts like filgrastim, pegfilgrastim, sargramostim -other chemotherapy drugs like cisplatin, doxorubicin, epirubicin, etoposide, teniposide, vincristine -quinidine -testosterone -vaccines -verapamil Talk to your doctor or health care professional before taking any of these medicines: -acetaminophen -aspirin -ibuprofen -ketoprofen -naproxen This list may not describe all possible interactions. Give your health care provider a list of all the medicines, herbs, non-prescription drugs, or dietary supplements you use. Also tell them if you smoke, drink alcohol, or use illegal drugs. Some items may interact with your medicine. What should I watch for while using this medicine? Your condition will be monitored carefully while you are receiving this medicine. You will need important blood work done while you are taking this medicine. This medicine can cause serious allergic reactions. To reduce your risk you will  need to take other  medicine(s) before treatment with this medicine. If you experience allergic reactions like skin rash, itching or hives, swelling of the face, lips, or tongue, tell your doctor or health care professional right away. In some cases, you may be given additional medicines to help with side effects. Follow all directions for their use. This drug may make you feel generally unwell. This is not uncommon, as chemotherapy can affect healthy cells as well as cancer cells. Report any side effects. Continue your course of treatment even though you feel ill unless your doctor tells you to stop. Call your doctor or health care professional for advice if you get a fever, chills or sore throat, or other symptoms of a cold or flu. Do not treat yourself. This drug decreases your body's ability to fight infections. Try to avoid being around people who are sick. This medicine may increase your risk to bruise or bleed. Call your doctor or health care professional if you notice any unusual bleeding. Be careful brushing and flossing your teeth or using a toothpick because you may get an infection or bleed more easily. If you have any dental work done, tell your dentist you are receiving this medicine. Avoid taking products that contain aspirin, acetaminophen, ibuprofen, naproxen, or ketoprofen unless instructed by your doctor. These medicines may hide a fever. Do not become pregnant while taking this medicine. Women should inform their doctor if they wish to become pregnant or think they might be pregnant. There is a potential for serious side effects to an unborn child. Talk to your health care professional or pharmacist for more information. Do not breast-feed an infant while taking this medicine. Men are advised not to father a child while receiving this medicine. This product may contain alcohol. Ask your pharmacist or healthcare provider if this medicine contains alcohol. Be sure to tell all healthcare providers you are  taking this medicine. Certain medicines, like metronidazole and disulfiram, can cause an unpleasant reaction when taken with alcohol. The reaction includes flushing, headache, nausea, vomiting, sweating, and increased thirst. The reaction can last from 30 minutes to several hours. What side effects may I notice from receiving this medicine? Side effects that you should report to your doctor or health care professional as soon as possible: -allergic reactions like skin rash, itching or hives, swelling of the face, lips, or tongue -low blood counts - This drug may decrease the number of white blood cells, red blood cells and platelets. You may be at increased risk for infections and bleeding. -signs of infection - fever or chills, cough, sore throat, pain or difficulty passing urine -signs of decreased platelets or bleeding - bruising, pinpoint red spots on the skin, black, tarry stools, nosebleeds -signs of decreased red blood cells - unusually weak or tired, fainting spells, lightheadedness -breathing problems -chest pain -high or low blood pressure -mouth sores -nausea and vomiting -pain, swelling, redness or irritation at the injection site -pain, tingling, numbness in the hands or feet -slow or irregular heartbeat -swelling of the ankle, feet, hands Side effects that usually do not require medical attention (report to your doctor or health care professional if they continue or are bothersome): -bone pain -complete hair loss including hair on your head, underarms, pubic hair, eyebrows, and eyelashes -changes in the color of fingernails -diarrhea -loosening of the fingernails -loss of appetite -muscle or joint pain -red flush to skin -sweating This list may not describe all possible side effects. Call your doctor for medical  advice about side effects. You may report side effects to FDA at 1-800-FDA-1088. Where should I keep my medicine? This drug is given in a hospital or clinic and  will not be stored at home. NOTE: This sheet is a summary. It may not cover all possible information. If you have questions about this medicine, talk to your doctor, pharmacist, or health care provider.  2018 Elsevier/Gold Standard (2015-07-15 19:58:00)   Carboplatin injection What is this medicine? CARBOPLATIN (KAR boe pla tin) is a chemotherapy drug. It targets fast dividing cells, like cancer cells, and causes these cells to die. This medicine is used to treat ovarian cancer and many other cancers. This medicine may be used for other purposes; ask your health care provider or pharmacist if you have questions. COMMON BRAND NAME(S): Paraplatin What should I tell my health care provider before I take this medicine? They need to know if you have any of these conditions: -blood disorders -hearing problems -kidney disease -recent or ongoing radiation therapy -an unusual or allergic reaction to carboplatin, cisplatin, other chemotherapy, other medicines, foods, dyes, or preservatives -pregnant or trying to get pregnant -breast-feeding How should I use this medicine? This drug is usually given as an infusion into a vein. It is administered in a hospital or clinic by a specially trained health care professional. Talk to your pediatrician regarding the use of this medicine in children. Special care may be needed. Overdosage: If you think you have taken too much of this medicine contact a poison control center or emergency room at once. NOTE: This medicine is only for you. Do not share this medicine with others. What if I miss a dose? It is important not to miss a dose. Call your doctor or health care professional if you are unable to keep an appointment. What may interact with this medicine? -medicines for seizures -medicines to increase blood counts like filgrastim, pegfilgrastim, sargramostim -some antibiotics like amikacin, gentamicin, neomycin, streptomycin, tobramycin -vaccines Talk to  your doctor or health care professional before taking any of these medicines: -acetaminophen -aspirin -ibuprofen -ketoprofen -naproxen This list may not describe all possible interactions. Give your health care provider a list of all the medicines, herbs, non-prescription drugs, or dietary supplements you use. Also tell them if you smoke, drink alcohol, or use illegal drugs. Some items may interact with your medicine. What should I watch for while using this medicine? Your condition will be monitored carefully while you are receiving this medicine. You will need important blood work done while you are taking this medicine. This drug may make you feel generally unwell. This is not uncommon, as chemotherapy can affect healthy cells as well as cancer cells. Report any side effects. Continue your course of treatment even though you feel ill unless your doctor tells you to stop. In some cases, you may be given additional medicines to help with side effects. Follow all directions for their use. Call your doctor or health care professional for advice if you get a fever, chills or sore throat, or other symptoms of a cold or flu. Do not treat yourself. This drug decreases your body's ability to fight infections. Try to avoid being around people who are sick. This medicine may increase your risk to bruise or bleed. Call your doctor or health care professional if you notice any unusual bleeding. Be careful brushing and flossing your teeth or using a toothpick because you may get an infection or bleed more easily. If you have any dental work done, tell  your dentist you are receiving this medicine. Avoid taking products that contain aspirin, acetaminophen, ibuprofen, naproxen, or ketoprofen unless instructed by your doctor. These medicines may hide a fever. Do not become pregnant while taking this medicine. Women should inform their doctor if they wish to become pregnant or think they might be pregnant. There is a  potential for serious side effects to an unborn child. Talk to your health care professional or pharmacist for more information. Do not breast-feed an infant while taking this medicine. What side effects may I notice from receiving this medicine? Side effects that you should report to your doctor or health care professional as soon as possible: -allergic reactions like skin rash, itching or hives, swelling of the face, lips, or tongue -signs of infection - fever or chills, cough, sore throat, pain or difficulty passing urine -signs of decreased platelets or bleeding - bruising, pinpoint red spots on the skin, black, tarry stools, nosebleeds -signs of decreased red blood cells - unusually weak or tired, fainting spells, lightheadedness -breathing problems -changes in hearing -changes in vision -chest pain -high blood pressure -low blood counts - This drug may decrease the number of white blood cells, red blood cells and platelets. You may be at increased risk for infections and bleeding. -nausea and vomiting -pain, swelling, redness or irritation at the injection site -pain, tingling, numbness in the hands or feet -problems with balance, talking, walking -trouble passing urine or change in the amount of urine Side effects that usually do not require medical attention (report to your doctor or health care professional if they continue or are bothersome): -hair loss -loss of appetite -metallic taste in the mouth or changes in taste This list may not describe all possible side effects. Call your doctor for medical advice about side effects. You may report side effects to FDA at 1-800-FDA-1088. Where should I keep my medicine? This drug is given in a hospital or clinic and will not be stored at home. NOTE: This sheet is a summary. It may not cover all possible information. If you have questions about this medicine, talk to your doctor, pharmacist, or health care provider.  2018 Elsevier/Gold  Standard (2007-12-19 14:38:05)

## 2018-04-03 NOTE — Progress Notes (Addendum)
[facility] OFFICE PROGRESS NOTE   Diagnosis: Non-small cell lung cancer  INTERVAL HISTORY:   Mr. Mezera returns as scheduled.  He reports stable dyspnea on exertion.  No significant cough.  He continues to smoke.  He is considering a nicotine patch.  No nausea or vomiting.  Bowels moving.  Objective:  Vital signs in last 24 hours:  Blood pressure 126/78, pulse 77, temperature 97.9 F (36.6 C), temperature source Oral, resp. rate 18, height 6' (1.829 m), weight 233 lb 9.6 oz (106 kg), SpO2 97 %.    HEENT: White coating over tongue.  No buccal thrush. Resp: Lungs with scattered wheezes.  No respiratory distress. Cardio: Regular rate and rhythm. GI: Abdomen soft and nontender.  No hepatomegaly. Vascular: No left leg edema. Neuro: Alert and oriented.    Lab Results:  Lab Results  Component Value Date   WBC 12.6 (H) 04/03/2018   HGB 15.3 04/03/2018   HCT 46.5 04/03/2018   MCV 93.6 04/03/2018   PLT 277 04/03/2018   NEUTROABS 8.3 (H) 04/03/2018    Imaging:  No results found.  Medications: I have reviewed the patient's current medications.  Assessment/Plan: 1. Non-small cell lung cancer, at least stage IIIa (T3 N2 M0) presenting with a large right upper lobe lung mass with questionable chest wall invasion as well as right hilar and mediastinal lymphadenopathy; PD-L1 99%; brain CT negative for metastatic disease. 2. Left parotid lesion on PET scan 03/10/2018.  Referred for ultrasound. 3. Poor peripheral IV access.  Referred for Port-A-Cath.  Disposition: Mr. Eble appears stable.  He is scheduled to begin concurrent chemo/radiation today.  We again reviewed potential toxicities associated with carboplatin and Taxol.  Questions were answered.  He agrees to proceed.  At today's visit he reports poor peripheral IV access.  We are referring him for placement of a Port-A-Cath.  The PET scan from 03/10/2018 showed a left parotid lesion.  We are referring him for an  ultrasound.  Plan to proceed with cycle 1 week 1 Taxol/carboplatin today as scheduled.  He will return for lab and week 2 of the chemotherapy 04/10/2018.  He will be seen in follow-up in 2 weeks.  He will contact the office in the interim with any problems.  Patient seen with Dr. Julien Nordmann.    Ned Card ANP/GNP-BC   04/03/2018  12:13 PM  ADDENDUM: Hematology/Oncology Attending: I had a face-to-face encounter with the patient today.  I recommended his care plan.  This is a very pleasant 57 years old white male with stage IIIa non-small cell lung cancer, adenocarcinoma with questionable chest wall invasion as well as right hilar and mediastinal lymphadenopathy.  PDL 1 expression was reported to be 99%.  Molecular studies by foundation 1 are still pending.  CT of the head performed recently showed no evidence of metastatic disease to the brain. I recommended for the patient to proceed with his current treatment with concurrent chemoradiation with weekly carboplatin and paclitaxel.  For the left parotid lesion seen on the previous PET scan, will arrange for the patient to have ultrasound of the parotid for further evaluation of this lesion. For the IV access, we will arrange for the patient to have Port-A-Cath placed by interventional radiology. He will come back for follow-up visit in 2 weeks for evaluation and management of any adverse effect of his treatment. The patient was advised to call immediately if he has any concerning symptoms in the interval.  Disclaimer: This note was dictated with voice  recognition software. Similar sounding words can inadvertently be transcribed and may be missed upon review. Eilleen Kempf, MD 04/03/18

## 2018-04-04 ENCOUNTER — Ambulatory Visit
Admission: RE | Admit: 2018-04-04 | Discharge: 2018-04-04 | Disposition: A | Discharge status: Home / Self Care | Payer: Medicare Other | Source: Ambulatory Visit | Attending: Radiation Oncology | Admitting: Radiation Oncology

## 2018-04-04 ENCOUNTER — Encounter: Payer: Self-pay | Admitting: Internal Medicine

## 2018-04-04 DIAGNOSIS — C3411 Malignant neoplasm of upper lobe, right bronchus or lung: Secondary | ICD-10-CM | POA: Diagnosis not present

## 2018-04-04 DIAGNOSIS — Z510 Encounter for antineoplastic radiation therapy: Secondary | ICD-10-CM | POA: Diagnosis not present

## 2018-04-04 DIAGNOSIS — C3491 Malignant neoplasm of unspecified part of right bronchus or lung: Secondary | ICD-10-CM

## 2018-04-04 MED ORDER — SONAFINE EX EMUL
1.0000 "application " | Freq: Once | CUTANEOUS | Status: AC
  Administered 2018-04-04: 1 via TOPICAL

## 2018-04-04 NOTE — Progress Notes (Signed)
Pt is approved for the $400 [facility] grant.  °

## 2018-04-04 NOTE — Progress Notes (Signed)
Pt here for patient teaching.  Pt given Radiation and You booklet and Sonafine.  Reviewed areas of pertinence such as fatigue, hair loss, skin changes and throat changes . Pt able to give teach back of to pat skin and use unscented/gentle soap,apply Sonafine bid and avoid applying anything to skin within 4 hours of treatment. Pt verbalizes understanding of information given and will contact nursing with any questions or concerns.    Education was given to the patient and his wife.  Gloriajean Dell. Leonie Green, BSN

## 2018-04-05 ENCOUNTER — Ambulatory Visit
Admission: RE | Admit: 2018-04-05 | Discharge: 2018-04-05 | Disposition: A | Discharge status: Home / Self Care | Payer: Medicare Other | Source: Ambulatory Visit | Attending: Radiation Oncology | Admitting: Radiation Oncology

## 2018-04-05 DIAGNOSIS — Z510 Encounter for antineoplastic radiation therapy: Secondary | ICD-10-CM | POA: Diagnosis not present

## 2018-04-05 DIAGNOSIS — C3411 Malignant neoplasm of upper lobe, right bronchus or lung: Secondary | ICD-10-CM | POA: Diagnosis not present

## 2018-04-05 DIAGNOSIS — C3491 Malignant neoplasm of unspecified part of right bronchus or lung: Secondary | ICD-10-CM | POA: Diagnosis not present

## 2018-04-06 ENCOUNTER — Ambulatory Visit
Admission: RE | Admit: 2018-04-06 | Discharge: 2018-04-06 | Disposition: A | Discharge status: Home / Self Care | Payer: Medicare Other | Source: Ambulatory Visit | Attending: Radiation Oncology | Admitting: Radiation Oncology

## 2018-04-06 DIAGNOSIS — C3491 Malignant neoplasm of unspecified part of right bronchus or lung: Secondary | ICD-10-CM | POA: Diagnosis not present

## 2018-04-06 DIAGNOSIS — Z510 Encounter for antineoplastic radiation therapy: Secondary | ICD-10-CM | POA: Diagnosis not present

## 2018-04-06 DIAGNOSIS — C3411 Malignant neoplasm of upper lobe, right bronchus or lung: Secondary | ICD-10-CM | POA: Diagnosis not present

## 2018-04-07 ENCOUNTER — Encounter: Payer: Self-pay | Admitting: *Deleted

## 2018-04-07 ENCOUNTER — Ambulatory Visit
Admission: RE | Admit: 2018-04-07 | Discharge: 2018-04-07 | Disposition: A | Discharge status: Home / Self Care | Payer: Medicare Other | Source: Ambulatory Visit | Attending: Radiation Oncology | Admitting: Radiation Oncology

## 2018-04-07 DIAGNOSIS — C3491 Malignant neoplasm of unspecified part of right bronchus or lung: Secondary | ICD-10-CM | POA: Diagnosis not present

## 2018-04-07 DIAGNOSIS — C3411 Malignant neoplasm of upper lobe, right bronchus or lung: Secondary | ICD-10-CM | POA: Diagnosis not present

## 2018-04-07 DIAGNOSIS — Z510 Encounter for antineoplastic radiation therapy: Secondary | ICD-10-CM | POA: Diagnosis not present

## 2018-04-07 NOTE — Progress Notes (Signed)
[facility] Psychosocial Distress Screening Clinical Social Work  Clinical Social Work was referred by distress screening protocol.  The patient scored a 10 on the Psychosocial Distress Thermometer which indicates severe distress. Clinical Social Worker attempted contacted patient by phone to assess for distress and other psychosocial needs.   [facility] DISTRESS SCREENING 03/28/2018  Screening Type Initial Screening  Distress experienced in past week (1-10) 10  Practical problem type Housing  Emotional problem type Nervousness/Anxiety;Adjusting to illness;Feeling hopeless  Spiritual/Religous concerns type Facing my mortality  Information Concerns Type Lack of info about complementary therapy choices  Physical Problem type Pain;Nausea/vomiting;Breathing;Constipation/diarrhea;Tingling hands/feet;Skin dry/itchy  Other [phone]    Clinical Social Worker follow up needed: CSW left voicemail requesting return call.  If yes, follow up plan:  Kennith Center, LCSW

## 2018-04-10 ENCOUNTER — Inpatient Hospital Stay: Payer: Medicare Other

## 2018-04-10 ENCOUNTER — Ambulatory Visit (HOSPITAL_COMMUNITY)
Admission: RE | Admit: 2018-04-10 | Discharge: 2018-04-10 | Disposition: A | Discharge status: Home / Self Care | Payer: Medicare Other | Source: Ambulatory Visit | Attending: Nurse Practitioner | Admitting: Nurse Practitioner

## 2018-04-10 ENCOUNTER — Ambulatory Visit
Admission: RE | Admit: 2018-04-10 | Discharge: 2018-04-10 | Disposition: A | Discharge status: Home / Self Care | Payer: Medicare Other | Source: Ambulatory Visit | Attending: Radiation Oncology | Admitting: Radiation Oncology

## 2018-04-10 ENCOUNTER — Other Ambulatory Visit: Payer: Self-pay | Admitting: Student

## 2018-04-10 DIAGNOSIS — C3491 Malignant neoplasm of unspecified part of right bronchus or lung: Secondary | ICD-10-CM

## 2018-04-10 DIAGNOSIS — Z5111 Encounter for antineoplastic chemotherapy: Secondary | ICD-10-CM | POA: Diagnosis not present

## 2018-04-10 DIAGNOSIS — R221 Localized swelling, mass and lump, neck: Secondary | ICD-10-CM | POA: Diagnosis not present

## 2018-04-10 DIAGNOSIS — R1310 Dysphagia, unspecified: Secondary | ICD-10-CM | POA: Diagnosis not present

## 2018-04-10 DIAGNOSIS — I898 Other specified noninfective disorders of lymphatic vessels and lymph nodes: Secondary | ICD-10-CM | POA: Diagnosis not present

## 2018-04-10 DIAGNOSIS — Z510 Encounter for antineoplastic radiation therapy: Secondary | ICD-10-CM | POA: Diagnosis not present

## 2018-04-10 DIAGNOSIS — K5900 Constipation, unspecified: Secondary | ICD-10-CM | POA: Diagnosis not present

## 2018-04-10 DIAGNOSIS — C3411 Malignant neoplasm of upper lobe, right bronchus or lung: Secondary | ICD-10-CM | POA: Diagnosis not present

## 2018-04-10 LAB — CBC WITH DIFFERENTIAL (CANCER CENTER ONLY)
BASOS ABS: 0.1 10*3/uL (ref 0.0–0.1)
BASOS PCT: 1 %
EOS ABS: 0.4 10*3/uL (ref 0.0–0.5)
EOS PCT: 6 %
HCT: 43.3 % (ref 38.4–49.9)
Hemoglobin: 14.6 g/dL (ref 13.0–17.1)
Lymphocytes Relative: 20 %
Lymphs Abs: 1.4 10*3/uL (ref 0.9–3.3)
MCH: 30.7 pg (ref 27.2–33.4)
MCHC: 33.6 g/dL (ref 32.0–36.0)
MCV: 91.4 fL (ref 79.3–98.0)
Monocytes Absolute: 0.3 10*3/uL (ref 0.1–0.9)
Monocytes Relative: 5 %
Neutro Abs: 4.6 10*3/uL (ref 1.5–6.5)
Neutrophils Relative %: 68 %
PLATELETS: 239 10*3/uL (ref 140–400)
RBC: 4.74 MIL/uL (ref 4.20–5.82)
RDW: 14.1 % (ref 11.0–14.6)
WBC: 6.7 10*3/uL (ref 4.0–10.3)

## 2018-04-10 LAB — CMP (CANCER CENTER ONLY)
ALBUMIN: 4.1 g/dL (ref 3.5–5.0)
ALT: 42 U/L (ref 0–44)
AST: 25 U/L (ref 15–41)
Alkaline Phosphatase: 131 U/L — ABNORMAL HIGH (ref 38–126)
Anion gap: 7 (ref 5–15)
BUN: 10 mg/dL (ref 6–20)
CHLORIDE: 100 mmol/L (ref 98–111)
CO2: 28 mmol/L (ref 22–32)
Calcium: 9.2 mg/dL (ref 8.9–10.3)
Creatinine: 0.85 mg/dL (ref 0.61–1.24)
GFR, Est AFR Am: >60 mL/min (ref >60)
GFR, Estimated: >60 mL/min (ref >60)
Glucose, Bld: 104 mg/dL — ABNORMAL HIGH (ref 70–99)
Potassium: 4.4 mmol/L (ref 3.5–5.1)
SODIUM: 135 mmol/L (ref 135–145)
Total Bilirubin: 0.6 mg/dL (ref 0.3–1.2)
Total Protein: 7 g/dL (ref 6.5–8.1)

## 2018-04-10 MED ORDER — FAMOTIDINE IN NACL 20-0.9 MG/50ML-% IV SOLN
INTRAVENOUS | Status: AC
  Filled 2018-04-10: qty 50

## 2018-04-10 MED ORDER — SODIUM CHLORIDE 0.9 % IV SOLN
20.0000 mg | Freq: Once | INTRAVENOUS | Status: AC
  Administered 2018-04-10: 20 mg via INTRAVENOUS
  Filled 2018-04-10: qty 2

## 2018-04-10 MED ORDER — PALONOSETRON HCL INJECTION 0.25 MG/5ML
INTRAVENOUS | Status: AC
Start: 2018-04-10 — End: ?
  Filled 2018-04-10: qty 5

## 2018-04-10 MED ORDER — PACLITAXEL CHEMO INJECTION 300 MG/50ML
45.0000 mg/m2 | Freq: Once | INTRAVENOUS | Status: AC
  Administered 2018-04-10: 108 mg via INTRAVENOUS
  Filled 2018-04-10: qty 18

## 2018-04-10 MED ORDER — FAMOTIDINE IN NACL 20-0.9 MG/50ML-% IV SOLN
20.0000 mg | Freq: Once | INTRAVENOUS | Status: AC
  Administered 2018-04-10: 20 mg via INTRAVENOUS

## 2018-04-10 MED ORDER — PALONOSETRON HCL INJECTION 0.25 MG/5ML
0.2500 mg | Freq: Once | INTRAVENOUS | Status: AC
  Administered 2018-04-10: 0.25 mg via INTRAVENOUS

## 2018-04-10 MED ORDER — DIPHENHYDRAMINE HCL 50 MG/ML IJ SOLN
INTRAMUSCULAR | Status: AC
  Filled 2018-04-10: qty 1

## 2018-04-10 MED ORDER — DIPHENHYDRAMINE HCL 50 MG/ML IJ SOLN
50.0000 mg | Freq: Once | INTRAMUSCULAR | Status: AC
  Administered 2018-04-10: 50 mg via INTRAVENOUS

## 2018-04-10 MED ORDER — SODIUM CHLORIDE 0.9 % IV SOLN
Freq: Once | INTRAVENOUS | Status: AC
  Administered 2018-04-10: 13:00:00 via INTRAVENOUS

## 2018-04-10 MED ORDER — SODIUM CHLORIDE 0.9 % IV SOLN
300.0000 mg | Freq: Once | INTRAVENOUS | Status: AC
  Administered 2018-04-10: 300 mg via INTRAVENOUS
  Filled 2018-04-10: qty 30

## 2018-04-10 NOTE — Patient Instructions (Signed)
[place] Discharge Instructions for Patients Receiving Chemotherapy  Today you received the following chemotherapy agents Taxol and Carboplatin  To help prevent nausea and vomiting after your treatment, we encourage you to take your nausea medication as prescribed.   If you develop nausea and vomiting that is not controlled by your nausea medication, call the clinic.   BELOW ARE SYMPTOMS THAT SHOULD BE REPORTED IMMEDIATELY:  *FEVER GREATER THAN 100.5 F  *CHILLS WITH OR WITHOUT FEVER  NAUSEA AND VOMITING THAT IS NOT CONTROLLED WITH YOUR NAUSEA MEDICATION  *UNUSUAL SHORTNESS OF BREATH  *UNUSUAL BRUISING OR BLEEDING  TENDERNESS IN MOUTH AND THROAT WITH OR WITHOUT PRESENCE OF ULCERS  *URINARY PROBLEMS  *BOWEL PROBLEMS  UNUSUAL RASH Items with * indicate a potential emergency and should be followed up as soon as possible.  Feel free to call the clinic should you have any questions or concerns. The clinic phone number is [phone].  Please show the Lane at check-in to the Emergency Department and triage nurse.  Paclitaxel injection (taxol) What is this medicine? PACLITAXEL (PAK li TAX el) is a chemotherapy drug. It targets fast dividing cells, like cancer cells, and causes these cells to die. This medicine is used to treat ovarian cancer, breast cancer, and other cancers. This medicine may be used for other purposes; ask your health care provider or pharmacist if you have questions. COMMON BRAND NAME(S): Onxol, Taxol What should I tell my health care provider before I take this medicine? They need to know if you have any of these conditions: -blood disorders -irregular heartbeat -infection (especially a virus infection such as chickenpox, cold sores, or herpes) -liver disease -previous or ongoing radiation therapy -an unusual or allergic reaction to paclitaxel, alcohol, polyoxyethylated castor oil, other chemotherapy agents, other medicines,  foods, dyes, or preservatives -pregnant or trying to get pregnant -breast-feeding How should I use this medicine? This drug is given as an infusion into a vein. It is administered in a hospital or clinic by a specially trained health care professional. Talk to your pediatrician regarding the use of this medicine in children. Special care may be needed. Overdosage: If you think you have taken too much of this medicine contact a poison control center or emergency room at once. NOTE: This medicine is only for you. Do not share this medicine with others. What if I miss a dose? It is important not to miss your dose. Call your doctor or health care professional if you are unable to keep an appointment. What may interact with this medicine? Do not take this medicine with any of the following medications: -disulfiram -metronidazole This medicine may also interact with the following medications: -cyclosporine -diazepam -ketoconazole -medicines to increase blood counts like filgrastim, pegfilgrastim, sargramostim -other chemotherapy drugs like cisplatin, doxorubicin, epirubicin, etoposide, teniposide, vincristine -quinidine -testosterone -vaccines -verapamil Talk to your doctor or health care professional before taking any of these medicines: -acetaminophen -aspirin -ibuprofen -ketoprofen -naproxen This list may not describe all possible interactions. Give your health care provider a list of all the medicines, herbs, non-prescription drugs, or dietary supplements you use. Also tell them if you smoke, drink alcohol, or use illegal drugs. Some items may interact with your medicine. What should I watch for while using this medicine? Your condition will be monitored carefully while you are receiving this medicine. You will need important blood work done while you are taking this medicine. This medicine can cause serious allergic reactions. To reduce your risk you will  need to take other  medicine(s) before treatment with this medicine. If you experience allergic reactions like skin rash, itching or hives, swelling of the face, lips, or tongue, tell your doctor or health care professional right away. In some cases, you may be given additional medicines to help with side effects. Follow all directions for their use. This drug may make you feel generally unwell. This is not uncommon, as chemotherapy can affect healthy cells as well as cancer cells. Report any side effects. Continue your course of treatment even though you feel ill unless your doctor tells you to stop. Call your doctor or health care professional for advice if you get a fever, chills or sore throat, or other symptoms of a cold or flu. Do not treat yourself. This drug decreases your body's ability to fight infections. Try to avoid being around people who are sick. This medicine may increase your risk to bruise or bleed. Call your doctor or health care professional if you notice any unusual bleeding. Be careful brushing and flossing your teeth or using a toothpick because you may get an infection or bleed more easily. If you have any dental work done, tell your dentist you are receiving this medicine. Avoid taking products that contain aspirin, acetaminophen, ibuprofen, naproxen, or ketoprofen unless instructed by your doctor. These medicines may hide a fever. Do not become pregnant while taking this medicine. Women should inform their doctor if they wish to become pregnant or think they might be pregnant. There is a potential for serious side effects to an unborn child. Talk to your health care professional or pharmacist for more information. Do not breast-feed an infant while taking this medicine. Men are advised not to father a child while receiving this medicine. This product may contain alcohol. Ask your pharmacist or healthcare provider if this medicine contains alcohol. Be sure to tell all healthcare providers you are  taking this medicine. Certain medicines, like metronidazole and disulfiram, can cause an unpleasant reaction when taken with alcohol. The reaction includes flushing, headache, nausea, vomiting, sweating, and increased thirst. The reaction can last from 30 minutes to several hours. What side effects may I notice from receiving this medicine? Side effects that you should report to your doctor or health care professional as soon as possible: -allergic reactions like skin rash, itching or hives, swelling of the face, lips, or tongue -low blood counts - This drug may decrease the number of white blood cells, red blood cells and platelets. You may be at increased risk for infections and bleeding. -signs of infection - fever or chills, cough, sore throat, pain or difficulty passing urine -signs of decreased platelets or bleeding - bruising, pinpoint red spots on the skin, black, tarry stools, nosebleeds -signs of decreased red blood cells - unusually weak or tired, fainting spells, lightheadedness -breathing problems -chest pain -high or low blood pressure -mouth sores -nausea and vomiting -pain, swelling, redness or irritation at the injection site -pain, tingling, numbness in the hands or feet -slow or irregular heartbeat -swelling of the ankle, feet, hands Side effects that usually do not require medical attention (report to your doctor or health care professional if they continue or are bothersome): -bone pain -complete hair loss including hair on your head, underarms, pubic hair, eyebrows, and eyelashes -changes in the color of fingernails -diarrhea -loosening of the fingernails -loss of appetite -muscle or joint pain -red flush to skin -sweating This list may not describe all possible side effects. Call your doctor for medical  advice about side effects. You may report side effects to FDA at 1-800-FDA-1088. Where should I keep my medicine? This drug is given in a hospital or clinic and  will not be stored at home. NOTE: This sheet is a summary. It may not cover all possible information. If you have questions about this medicine, talk to your doctor, pharmacist, or health care provider.  2018 Elsevier/Gold Standard (2015-07-15 19:58:00)   Carboplatin injection What is this medicine? CARBOPLATIN (KAR boe pla tin) is a chemotherapy drug. It targets fast dividing cells, like cancer cells, and causes these cells to die. This medicine is used to treat ovarian cancer and many other cancers. This medicine may be used for other purposes; ask your health care provider or pharmacist if you have questions. COMMON BRAND NAME(S): Paraplatin What should I tell my health care provider before I take this medicine? They need to know if you have any of these conditions: -blood disorders -hearing problems -kidney disease -recent or ongoing radiation therapy -an unusual or allergic reaction to carboplatin, cisplatin, other chemotherapy, other medicines, foods, dyes, or preservatives -pregnant or trying to get pregnant -breast-feeding How should I use this medicine? This drug is usually given as an infusion into a vein. It is administered in a hospital or clinic by a specially trained health care professional. Talk to your pediatrician regarding the use of this medicine in children. Special care may be needed. Overdosage: If you think you have taken too much of this medicine contact a poison control center or emergency room at once. NOTE: This medicine is only for you. Do not share this medicine with others. What if I miss a dose? It is important not to miss a dose. Call your doctor or health care professional if you are unable to keep an appointment. What may interact with this medicine? -medicines for seizures -medicines to increase blood counts like filgrastim, pegfilgrastim, sargramostim -some antibiotics like amikacin, gentamicin, neomycin, streptomycin, tobramycin -vaccines Talk to  your doctor or health care professional before taking any of these medicines: -acetaminophen -aspirin -ibuprofen -ketoprofen -naproxen This list may not describe all possible interactions. Give your health care provider a list of all the medicines, herbs, non-prescription drugs, or dietary supplements you use. Also tell them if you smoke, drink alcohol, or use illegal drugs. Some items may interact with your medicine. What should I watch for while using this medicine? Your condition will be monitored carefully while you are receiving this medicine. You will need important blood work done while you are taking this medicine. This drug may make you feel generally unwell. This is not uncommon, as chemotherapy can affect healthy cells as well as cancer cells. Report any side effects. Continue your course of treatment even though you feel ill unless your doctor tells you to stop. In some cases, you may be given additional medicines to help with side effects. Follow all directions for their use. Call your doctor or health care professional for advice if you get a fever, chills or sore throat, or other symptoms of a cold or flu. Do not treat yourself. This drug decreases your body's ability to fight infections. Try to avoid being around people who are sick. This medicine may increase your risk to bruise or bleed. Call your doctor or health care professional if you notice any unusual bleeding. Be careful brushing and flossing your teeth or using a toothpick because you may get an infection or bleed more easily. If you have any dental work done, tell  your dentist you are receiving this medicine. Avoid taking products that contain aspirin, acetaminophen, ibuprofen, naproxen, or ketoprofen unless instructed by your doctor. These medicines may hide a fever. Do not become pregnant while taking this medicine. Women should inform their doctor if they wish to become pregnant or think they might be pregnant. There is a  potential for serious side effects to an unborn child. Talk to your health care professional or pharmacist for more information. Do not breast-feed an infant while taking this medicine. What side effects may I notice from receiving this medicine? Side effects that you should report to your doctor or health care professional as soon as possible: -allergic reactions like skin rash, itching or hives, swelling of the face, lips, or tongue -signs of infection - fever or chills, cough, sore throat, pain or difficulty passing urine -signs of decreased platelets or bleeding - bruising, pinpoint red spots on the skin, black, tarry stools, nosebleeds -signs of decreased red blood cells - unusually weak or tired, fainting spells, lightheadedness -breathing problems -changes in hearing -changes in vision -chest pain -high blood pressure -low blood counts - This drug may decrease the number of white blood cells, red blood cells and platelets. You may be at increased risk for infections and bleeding. -nausea and vomiting -pain, swelling, redness or irritation at the injection site -pain, tingling, numbness in the hands or feet -problems with balance, talking, walking -trouble passing urine or change in the amount of urine Side effects that usually do not require medical attention (report to your doctor or health care professional if they continue or are bothersome): -hair loss -loss of appetite -metallic taste in the mouth or changes in taste This list may not describe all possible side effects. Call your doctor for medical advice about side effects. You may report side effects to FDA at 1-800-FDA-1088. Where should I keep my medicine? This drug is given in a hospital or clinic and will not be stored at home. NOTE: This sheet is a summary. It may not cover all possible information. If you have questions about this medicine, talk to your doctor, pharmacist, or health care provider.  2018 Elsevier/Gold  Standard (2007-12-19 14:38:05)

## 2018-04-11 ENCOUNTER — Other Ambulatory Visit: Payer: Self-pay

## 2018-04-11 ENCOUNTER — Ambulatory Visit (HOSPITAL_COMMUNITY)
Admission: RE | Admit: 2018-04-11 | Discharge: 2018-04-11 | Disposition: A | Discharge status: Home / Self Care | Payer: Medicare Other | Source: Ambulatory Visit | Attending: Nurse Practitioner | Admitting: Nurse Practitioner

## 2018-04-11 ENCOUNTER — Encounter (HOSPITAL_COMMUNITY): Payer: Self-pay

## 2018-04-11 ENCOUNTER — Ambulatory Visit (HOSPITAL_COMMUNITY)
Admission: RE | Admit: 2018-04-11 | Discharge: 2018-04-11 | Disposition: A | Discharge status: Home / Self Care | Payer: Medicare Other | Source: Ambulatory Visit | Attending: Internal Medicine | Admitting: Internal Medicine

## 2018-04-11 ENCOUNTER — Ambulatory Visit
Admission: RE | Admit: 2018-04-11 | Discharge: 2018-04-11 | Disposition: A | Discharge status: Home / Self Care | Payer: Medicare Other | Source: Ambulatory Visit | Attending: Radiation Oncology | Admitting: Radiation Oncology

## 2018-04-11 ENCOUNTER — Other Ambulatory Visit: Payer: Self-pay | Admitting: Nurse Practitioner

## 2018-04-11 DIAGNOSIS — F419 Anxiety disorder, unspecified: Secondary | ICD-10-CM | POA: Diagnosis not present

## 2018-04-11 DIAGNOSIS — Z5111 Encounter for antineoplastic chemotherapy: Secondary | ICD-10-CM | POA: Diagnosis not present

## 2018-04-11 DIAGNOSIS — Z955 Presence of coronary angioplasty implant and graft: Secondary | ICD-10-CM | POA: Insufficient documentation

## 2018-04-11 DIAGNOSIS — Z86718 Personal history of other venous thrombosis and embolism: Secondary | ICD-10-CM | POA: Diagnosis not present

## 2018-04-11 DIAGNOSIS — J45909 Unspecified asthma, uncomplicated: Secondary | ICD-10-CM | POA: Insufficient documentation

## 2018-04-11 DIAGNOSIS — B1920 Unspecified viral hepatitis C without hepatic coma: Secondary | ICD-10-CM | POA: Insufficient documentation

## 2018-04-11 DIAGNOSIS — Z95810 Presence of automatic (implantable) cardiac defibrillator: Secondary | ICD-10-CM | POA: Diagnosis not present

## 2018-04-11 DIAGNOSIS — F17210 Nicotine dependence, cigarettes, uncomplicated: Secondary | ICD-10-CM | POA: Insufficient documentation

## 2018-04-11 DIAGNOSIS — Z7982 Long term (current) use of aspirin: Secondary | ICD-10-CM | POA: Insufficient documentation

## 2018-04-11 DIAGNOSIS — Z510 Encounter for antineoplastic radiation therapy: Secondary | ICD-10-CM | POA: Diagnosis not present

## 2018-04-11 DIAGNOSIS — C3491 Malignant neoplasm of unspecified part of right bronchus or lung: Secondary | ICD-10-CM | POA: Diagnosis not present

## 2018-04-11 DIAGNOSIS — Z452 Encounter for adjustment and management of vascular access device: Secondary | ICD-10-CM | POA: Diagnosis not present

## 2018-04-11 DIAGNOSIS — C3490 Malignant neoplasm of unspecified part of unspecified bronchus or lung: Secondary | ICD-10-CM | POA: Diagnosis not present

## 2018-04-11 DIAGNOSIS — I252 Old myocardial infarction: Secondary | ICD-10-CM | POA: Insufficient documentation

## 2018-04-11 DIAGNOSIS — C3411 Malignant neoplasm of upper lobe, right bronchus or lung: Secondary | ICD-10-CM | POA: Diagnosis not present

## 2018-04-11 DIAGNOSIS — F329 Major depressive disorder, single episode, unspecified: Secondary | ICD-10-CM | POA: Diagnosis not present

## 2018-04-11 DIAGNOSIS — E7800 Pure hypercholesterolemia, unspecified: Secondary | ICD-10-CM | POA: Diagnosis not present

## 2018-04-11 HISTORY — PX: IR IMAGING GUIDED PORT INSERTION: IMG5740

## 2018-04-11 LAB — CBC
HEMATOCRIT: 44.1 % (ref 39.0–52.0)
HEMOGLOBIN: 14.8 g/dL (ref 13.0–17.0)
MCH: 30.9 pg (ref 26.0–34.0)
MCHC: 33.6 g/dL (ref 30.0–36.0)
MCV: 92.1 fL (ref 78.0–100.0)
Platelets: 293 10*3/uL (ref 150–400)
RBC: 4.79 MIL/uL (ref 4.22–5.81)
RDW: 13.7 % (ref 11.5–15.5)
WBC: 11.7 10*3/uL — ABNORMAL HIGH (ref 4.0–10.5)

## 2018-04-11 LAB — PROTIME-INR
INR: 0.93
Prothrombin Time: 12.4 seconds (ref 11.4–15.2)

## 2018-04-11 LAB — APTT: aPTT: 30 seconds (ref 24–36)

## 2018-04-11 MED ORDER — CEFAZOLIN SODIUM-DEXTROSE 2-4 GM/100ML-% IV SOLN
INTRAVENOUS | Status: AC
  Filled 2018-04-11: qty 100

## 2018-04-11 MED ORDER — MIDAZOLAM HCL 2 MG/2ML IJ SOLN
INTRAMUSCULAR | Status: AC | PRN
  Administered 2018-04-11: 1 mg via INTRAVENOUS
  Administered 2018-04-11: 2 mg via INTRAVENOUS
  Administered 2018-04-11: 1 mg via INTRAVENOUS

## 2018-04-11 MED ORDER — LIDOCAINE-EPINEPHRINE (PF) 2 %-1:200000 IJ SOLN
INTRAMUSCULAR | Status: AC | PRN
  Administered 2018-04-11: 10 mL

## 2018-04-11 MED ORDER — HEPARIN SOD (PORK) LOCK FLUSH 100 UNIT/ML IV SOLN
INTRAVENOUS | Status: AC | PRN
  Administered 2018-04-11: 500 [IU] via INTRAVENOUS

## 2018-04-11 MED ORDER — FENTANYL CITRATE (PF) 100 MCG/2ML IJ SOLN
INTRAMUSCULAR | Status: AC | PRN
  Administered 2018-04-11 (×2): 50 ug via INTRAVENOUS

## 2018-04-11 MED ORDER — FENTANYL CITRATE (PF) 100 MCG/2ML IJ SOLN
INTRAMUSCULAR | Status: AC
  Filled 2018-04-11: qty 2

## 2018-04-11 MED ORDER — HEPARIN SOD (PORK) LOCK FLUSH 100 UNIT/ML IV SOLN
INTRAVENOUS | Status: AC
  Filled 2018-04-11: qty 5

## 2018-04-11 MED ORDER — LIDOCAINE-EPINEPHRINE (PF) 2 %-1:200000 IJ SOLN
INTRAMUSCULAR | Status: AC | PRN
  Administered 2018-04-11: 5 mL

## 2018-04-11 MED ORDER — MIDAZOLAM HCL 2 MG/2ML IJ SOLN
INTRAMUSCULAR | Status: AC
  Filled 2018-04-11: qty 4

## 2018-04-11 MED ORDER — CEFAZOLIN SODIUM-DEXTROSE 2-4 GM/100ML-% IV SOLN
2.0000 g | INTRAVENOUS | Status: AC
  Administered 2018-04-11: 2 g via INTRAVENOUS

## 2018-04-11 MED ORDER — LIDOCAINE-EPINEPHRINE (PF) 2 %-1:200000 IJ SOLN
INTRAMUSCULAR | Status: AC
  Filled 2018-04-11: qty 20

## 2018-04-11 MED ORDER — SODIUM CHLORIDE 0.9 % IV SOLN
INTRAVENOUS | Status: DC
  Administered 2018-04-11: 11:00:00 via INTRAVENOUS

## 2018-04-11 NOTE — Procedures (Signed)
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

## 2018-04-11 NOTE — Discharge Instructions (Signed)
Implanted Port Home Guide °An implanted port is a type of central line that is placed under the skin. Central lines are used to provide IV access when treatment or nutrition needs to be given through a person’s veins. Implanted ports are used for long-term IV access. An implanted port may be placed because: °· You need IV medicine that would be irritating to the small veins in your hands or arms. °· You need long-term IV medicines, such as antibiotics. °· You need IV nutrition for a long period. °· You need frequent blood draws for lab tests. °· You need dialysis. ° °Implanted ports are usually placed in the chest area, but they can also be placed in the upper arm, the abdomen, or the leg. An implanted port has two main parts: °· Reservoir. The reservoir is round and will appear as a small, raised area under your skin. The reservoir is the part where a needle is inserted to give medicines or draw blood. °· Catheter. The catheter is a thin, flexible tube that extends from the reservoir. The catheter is placed into a large vein. Medicine that is inserted into the reservoir goes into the catheter and then into the vein. ° °How will I care for my incision site? °Do not get the incision site wet. Bathe or shower as directed by your health care provider. °How is my port accessed? °Special steps must be taken to access the port: °· Before the port is accessed, a numbing cream can be placed on the skin. This helps numb the skin over the port site. °· Your health care provider uses a sterile technique to access the port. °? Your health care provider must put on a mask and sterile gloves. °? The skin over your port is cleaned carefully with an antiseptic and allowed to dry. °? The port is gently pinched between sterile gloves, and a needle is inserted into the port. °· Only "non-coring" port needles should be used to access the port. Once the port is accessed, a blood return should be checked. This helps ensure that the port  is in the vein and is not clogged. °· If your port needs to remain accessed for a constant infusion, a clear (transparent) bandage will be placed over the needle site. The bandage and needle will need to be changed every week, or as directed by your health care provider. °· Keep the bandage covering the needle clean and dry. Do not get it wet. Follow your health care provider’s instructions on how to take a shower or bath while the port is accessed. °· If your port does not need to stay accessed, no bandage is needed over the port. ° °What is flushing? °Flushing helps keep the port from getting clogged. Follow your health care provider’s instructions on how and when to flush the port. Ports are usually flushed with saline solution or a medicine called heparin. The need for flushing will depend on how the port is used. °· If the port is used for intermittent medicines or blood draws, the port will need to be flushed: °? After medicines have been given. °? After blood has been drawn. °? As part of routine maintenance. °· If a constant infusion is running, the port may not need to be flushed. ° °How long will my port stay implanted? °The port can stay in for as long as your health care provider thinks it is needed. When it is time for the port to come out, surgery will be   done to remove it. The procedure is similar to the one performed when the port was put in. When should I seek immediate medical care? When you have an implanted port, you should seek immediate medical care if:  You notice a bad smell coming from the incision site.  You have swelling, redness, or drainage at the incision site.  You have more swelling or pain at the port site or the surrounding area.  You have a fever that is not controlled with medicine.  This information is not intended to replace advice given to you by your health care provider. Make sure you discuss any questions you have with your health care provider. Document  Released: 09/13/2005 Document Revised: 02/19/2016 Document Reviewed: 05/21/2013 Elsevier Interactive Patient Education  2017 [place]. Moderate Conscious Sedation, Adult, Care After These instructions provide you with information about caring for yourself after your procedure. Your health care provider may also give you more specific instructions. Your treatment has been planned according to current medical practices, but problems sometimes occur. Call your health care provider if you have any problems or questions after your procedure. What can I expect after the procedure? After your procedure, it is common:  To feel sleepy for several hours.  To feel clumsy and have poor balance for several hours.  To have poor judgment for several hours.  To vomit if you eat too soon.  Follow these instructions at home: For at least 24 hours after the procedure:   Do not: ? Participate in activities where you could fall or become injured. ? Drive. ? Use heavy machinery. ? Drink alcohol. ? Take sleeping pills or medicines that cause drowsiness. ? Make important decisions or sign legal documents. ? Take care of children on your own.  Rest. Eating and drinking  Follow the diet recommended by your health care provider.  If you vomit: ? Drink water, juice, or soup when you can drink without vomiting. ? Make sure you have little or no nausea before eating solid foods. General instructions  Have a responsible adult stay with you until you are awake and alert.  Take over-the-counter and prescription medicines only as told by your health care provider.  If you smoke, do not smoke without supervision.  Keep all follow-up visits as told by your health care provider. This is important. Contact a health care provider if:  You keep feeling nauseous or you keep vomiting.  You feel light-headed.  You develop a rash.  You have a fever. Get help right away if:  You have trouble  breathing. This information is not intended to replace advice given to you by your health care provider. Make sure you discuss any questions you have with your health care provider. Document Released: 07/04/2013 Document Revised: 02/16/2016 Document Reviewed: 01/03/2016 Elsevier Interactive Patient Education  [employer].

## 2018-04-11 NOTE — Progress Notes (Signed)
@Patient  ID: [name], male    DOB: [birth year], 57 y.o.   MRN: [id number]  Chief Complaint  Patient presents with  . Follow-up    Needs results of Biopsy. Port placed yesterday.     Referring provider: Redmond School, MD  HPI: 57 year old current every day smoker followed in our office for right upper lobe mass.  Patient is actively being treated with chemotherapy as well as radiation.  Patient being seen by Dr. Earlie Server in oncology. Patient of Dr. [name]   He has chronic systolic heart failure related to STEMI in 04/2016 that required a proximal LAD stent.   Tests:  PFT 02/2018 showed FEV1 of 51%, improved with bronchodilator to 57%, ratio 57, DLCO 62%   Imaging:  CT chest with IV contrast 03/03/2018 showed large macrolobulated mass measuring 7.9 x 7.1 x 6.5 cm abutting the pleural surface with suggestion of pleural invasion and potentially even chest wall invasion.  There was a nodule in the posterior aspect of the left upper lobe measuring 10 x4 mm which was favored to be a subpleural lymph node and a small calcified granuloma in the left lower lobe.  Mild emphysema was also noted.  PET scan -right upper lobe mass lighted up as stated right hilar node. Incidentally right cervical lymph node level 2 and low-grade metabolism and is considered reactive.  Left parotid 6 mm nodule was hypermetabolic.  Cardiac:  Echo 01/2018 showed EF to be persistently low at 25% and hence AICD was placed on 03/02/2018      04/12/18  OV   57 year old patient seen office today for follow-up visit.  Patient has since seen Dr. Earlie Server as well as started chemo and radiation.  Patient reports that he is doing well except he still has a chronic wheeze.  Patient initially declined maintenance therapy with Dr. [name] had initial office visit.  Patient is actively working on stopping smoking.  Patient reports that he was smoking 2 packs/day and now is down to 1.  Unfortunately patient spouse smokes quite heavily  as well in the house.  Patient admits that he knows that he needs to stop smoking as this has contributed to his current respiratory problems as well as his lung cancer.     Allergies  Allergen Reactions  . Lyrica [Pregabalin] Palpitations    Chest pain and heart palps.  . Neurontin [Gabapentin] Palpitations    Chest pain and heart palp    Immunization History  Administered Date(s) Administered  . Pneumococcal Polysaccharide-23 05/28/2016  . Td 12/06/2003    Past Medical History:  Diagnosis Date  . 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 Anxiety   . Asthma   . Depression   . DVT (deep venous thrombosis) ([place]) 2009; 2019   RLE; LLE  . Hepatitis C    "tx'd in 2015"  . High cholesterol   . STEMI (ST elevation myocardial infarction) ([place]) 05/26/2016  . Tobacco abuse 01/28/2016  . Urinary dribbling     Tobacco History: Social History   Tobacco Use  Smoking Status Current Every Day Smoker  . Packs/day: 2.00  . Years: 31.00  . Pack years: 62.00  . Types: Cigarettes  Smokeless Tobacco Never Used  Tobacco Comment   half a pack/day now   Ready to quit: Yes Counseling given: Yes Comment: half a pack/day now Discussed extensively with patient.  Patient was smoking 2 packs/day prior to seeing Dr. [name] in June/2019.  Patient is now down to 1 pack/day.  Patient says sometimes is even less than that.  Discussed with patient that he should use reduced to quit method and should have a goal to be under 0.5 packs/day by her next visit in 8 weeks.  Our goal is for patient to stop smoking completely.  Patient's spouse smokes quite often discussed with them that she needs to smoke outside, and also should work on stopping smoking with him.  As secondhand smoke was also a smoking irritant and will affect his respiratory status.  1 [place]  >>> Patient to call this resource and utilize it to help support her quit smoking >>> Keep up your hard work with  stopping smoking  You can also contact the [facility] >>>For smoking cessation classes call [phone]  We do not recommend using e-cigarettes as a form of stopping smoking  Smoking cessation information provided to patient today  Outpatient Encounter Medications as of 04/12/2018  Medication Sig  . alprazolam (XANAX) 2 MG tablet Take 2 mg by mouth 4 (four) times daily as needed for anxiety.   [name] amitriptyline (ELAVIL) 100 MG tablet Take 100 mg by mouth at bedtime.   [name] aspirin 81 MG EC tablet Take 81 mg by mouth daily.    [name] atorvastatin (LIPITOR) 80 MG tablet Take 1 tablet (80 mg total) by mouth daily at 6 PM.  . carvedilol (COREG) 6.25 MG tablet TAKE 1 TABLET BY MOUTH TWICE A DAY WITH A MEAL  . citalopram (CELEXA) 40 MG tablet Take 40 mg by mouth daily.  [name] HYDROcodone-acetaminophen (NORCO) 10-325 MG tablet Take 1 tablet by mouth every 4 (four) hours as needed for moderate pain.   [name] lisinopril (PRINIVIL,ZESTRIL) 2.5 MG tablet Take 1 tablet (2.5 mg total) by mouth daily.  . nitroGLYCERIN (NITROSTAT) 0.4 MG SL tablet Place 1 tablet (0.4 mg total) under the tongue every 5 (five) minutes x 3 doses as needed for chest pain.  [name] prochlorperazine (COMPAZINE) 10 MG tablet Take 1 tablet (10 mg total) by mouth every 6 (six) hours as needed for nausea or vomiting.  [name] spironolactone (ALDACTONE) 25 MG tablet TAKE 1 TABLET BY MOUTH EVERY DAY  . tamsulosin (FLOMAX) 0.4 MG CAPS capsule Take 1 capsule by mouth daily.  [name] umeclidinium-vilanterol (ANORO ELLIPTA) 62.5-25 MCG/INH AEPB Inhale 1 puff into the lungs daily.  . [DISCONTINUED] nicotine (NICODERM CQ - DOSED IN MG/24 HOURS) 21 mg/24hr patch Place 21 mg onto the skin daily.  . [DISCONTINUED] 0.9 %  sodium chloride infusion   . [DISCONTINUED] ceFAZolin (ANCEF) 2-4 GM/100ML-% IVPB   . [DISCONTINUED] fentaNYL (SUBLIMAZE) 100 MCG/2ML injection   . [DISCONTINUED] heparin lock flush 100 UNIT/ML injection   . [DISCONTINUED] lidocaine-EPINEPHrine  (XYLOCAINE W/EPI) 2 %-1:200000 (PF) injection   . [DISCONTINUED] midazolam (VERSED) 2 MG/2ML injection    No facility-administered encounter medications on file as of 04/12/2018.      Review of Systems  Review of Systems  Constitutional: Negative for activity change, chills, fatigue, fever and unexpected weight change.  HENT: Negative for postnasal drip, rhinorrhea, sinus pressure, sinus pain, sneezing and sore throat.   Respiratory: Positive for shortness of breath and wheezing. Negative for cough.   Cardiovascular: Negative for chest pain and palpitations.  Gastrointestinal: Negative for constipation, diarrhea, nausea and vomiting.  Genitourinary: Negative for hematuria and urgency.  Musculoskeletal: Negative for arthralgias.  Skin: Negative for color change.  Neurological: Negative for dizziness, seizures and headaches.  Psychiatric/Behavioral: Negative for dysphoric mood. The patient is not nervous/anxious.  All other systems reviewed and are negative.    Physical Exam  BP 108/70   Pulse 81   Ht 6\' 1"  (1.854 m)   Wt 215 lb 6.4 oz (97.7 kg)   SpO2 98%   BMI 28.42 kg/m   Wt Readings from Last 5 Encounters:  04/12/18 215 lb 6.4 oz (97.7 kg)  04/03/18 233 lb 9.6 oz (106 kg)  03/28/18 237 lb 3.2 oz (107.6 kg)  03/22/18 237 lb 4.8 oz (107.6 kg)  03/21/18 240 lb (108.9 kg)     Physical Exam  Constitutional: He is oriented to person, place, and time and well-developed, well-nourished, and in no distress. No distress.  HENT:  Head: Normocephalic and atraumatic.  Right Ear: Hearing, tympanic membrane, external ear and ear canal normal.  Left Ear: Hearing, tympanic membrane, external ear and ear canal normal.  Nose: Nose normal. Right sinus exhibits no maxillary sinus tenderness and no frontal sinus tenderness. Left sinus exhibits no maxillary sinus tenderness and no frontal sinus tenderness.  Mouth/Throat: Uvula is midline and oropharynx is clear and moist. No oropharyngeal  exudate.  Cerumen bilaterally  Eyes: Pupils are equal, round, and reactive to light.  Neck: Normal range of motion. Neck supple. No JVD present.  Cardiovascular: Normal rate, regular rhythm and normal heart sounds.  Pulmonary/Chest: Effort normal. No accessory muscle usage. No respiratory distress. He has no decreased breath sounds. He has wheezes (Expiratory wheezes, slight inspiratory wheeze). He has no rhonchi.  Port in place  Abdominal: Soft. Bowel sounds are normal. There is no tenderness.  Musculoskeletal: Normal range of motion. He exhibits no edema.  Lymphadenopathy:    He has no cervical adenopathy.  Neurological: He is alert and oriented to person, place, and time. Gait normal.  Skin: Skin is warm and dry. He is not diaphoretic. No erythema.  Psychiatric: Mood, memory, affect and judgment normal.  Nursing note and vitals reviewed.     Lab Results:  CBC    Component Value Date/Time   WBC 11.7 (H) 04/11/2018 1032   RBC 4.79 04/11/2018 1032   HGB 14.8 04/11/2018 1032   HGB 14.6 04/10/2018 1226   HGB 16.4 02/27/2018 1457   HCT 44.1 04/11/2018 1032   HCT 47.6 02/27/2018 1457   PLT 293 04/11/2018 1032   PLT 239 04/10/2018 1226   PLT 307 02/27/2018 1457   MCV 92.1 04/11/2018 1032   MCV 90 02/27/2018 1457   MCH 30.9 04/11/2018 1032   MCHC 33.6 04/11/2018 1032   RDW 13.7 04/11/2018 1032   RDW 14.5 02/27/2018 1457   LYMPHSABS 1.4 04/10/2018 1226   MONOABS 0.3 04/10/2018 1226   EOSABS 0.4 04/10/2018 1226   BASOSABS 0.1 04/10/2018 1226    BMET    Component Value Date/Time   NA 135 04/10/2018 1226   NA 139 02/27/2018 1457   K 4.4 04/10/2018 1226   CL 100 04/10/2018 1226   CO2 28 04/10/2018 1226   GLUCOSE 104 (H) 04/10/2018 1226   BUN 10 04/10/2018 1226   BUN 7 02/27/2018 1457   CREATININE 0.85 04/10/2018 1226   CREATININE 1.13 06/28/2016 1453   CALCIUM 9.2 04/10/2018 1226   GFRNONAA >60 04/10/2018 1226   GFRNONAA 74 06/28/2016 1453   GFRAA >60 04/10/2018 1226    GFRAA 85 06/28/2016 1453    BNP No results found for: BNP  ProBNP No results found for: PROBNP  Imaging: Ct Head W Wo Contrast  Result Date: 03/31/2018 CLINICAL DATA:  New diagnosis lung cancer.  Staging. EXAM: CT HEAD WITHOUT AND WITH CONTRAST TECHNIQUE: Contiguous axial images were obtained from the base of the skull through the vertex without and with intravenous contrast CONTRAST:  49mL ISOVUE-300 IOPAMIDOL (ISOVUE-300) INJECTION 61% COMPARISON:  03/11/2005 FINDINGS: Brain: The brain shows a normal appearance without evidence of malformation, atrophy, old or acute small or large vessel infarction, mass lesion, hemorrhage, hydrocephalus or extra-axial collection. After contrast administration, no abnormal enhancement occurs. Vascular: No hyperdense vessel. No evidence of atherosclerotic calcification. Skull: Normal.  No traumatic finding.  No focal bone lesion. Sinuses/Orbits: Sinuses are clear. Orbits appear normal. Mastoids are clear. Other: Right posterior parietal scalp sebaceous cyst. IMPRESSION: Normal study.  No evidence of metastatic disease. Electronically Signed   By: Nelson Chimes M.D.   On: 03/31/2018 15:56   US Soft Tissue Head/neck  Result Date: 04/10/2018 CLINICAL DATA:  Follow-up LEFT parotid mass. History of lung cancer. EXAM: ULTRASOUND OF HEAD/NECK SOFT TISSUES TECHNIQUE: Ultrasound examination of the head and neck soft tissues was performed in the area of clinical concern. COMPARISON:  PET-CT March 10, 2018 FINDINGS: 6 x 5 x 5 mm reniform lymph node with echogenic hilum present corresponding to PET-CT finding. No additional masses. No abnormal vascularity or echogenic sialolith. IMPRESSION: 5 mm benign-appearing lymph node LEFT parotid gland corresponding to PET-CT finding. Electronically Signed   By: Elon Alas M.D.   On: 04/10/2018 13:30   Ct Biopsy  Result Date: 03/21/2018 INDICATION: Hypermetabolic right upper lobe pulmonary mass. Please perform CT-guided biopsy  for tissue diagnostic purposes. EXAM: CT-GUIDED RIGHT UPPER LOBE MASS BIOPSY COMPARISON:  PET-CT-03/10/2018; chest CT-03/03/2018 MEDICATIONS: None. ANESTHESIA/SEDATION: Fentanyl 75 mcg IV; Versed 1.5 mg IV Sedation time: 60 minutes; The patient was continuously monitored during the procedure by the interventional radiology nurse under my direct supervision. CONTRAST:  None COMPLICATIONS: None immediate. PROCEDURE: Informed consent was obtained from the patient following an explanation of the procedure, risks, benefits and alternatives. The patient understands,agrees and consents for the procedure. All questions were addressed. A time out was performed prior to the initiation of the procedure. The patient was positioned supine on the CT table and a limited chest CT was performed for procedural planning demonstrating grossly unchanged size and appearance of macrolobulated approximately 8.1 x 7.0 cm right upper lobe mass (image 24, series 2). The operative site was prepped and draped in the usual sterile fashion. Under sterile conditions and local anesthesia, a 17 gauge coaxial needle was advanced into the peripheral aspect of the nodule. Positioning was confirmed with intermittent CT fluoroscopy and followed by the acquisition of 3 core needle biopsies with an 18 gauge core needle biopsy device. The coaxial needle was removed following deployment of a Biosentry plug and superficial hemostasis was achieved with manual compression. Limited post procedural chest CT was negative for pneumothorax or additional complication. A dressing was placed. The patient tolerated the procedure well without immediate postprocedural complication. The patient was escorted to have an upright chest radiograph. IMPRESSION: Technically successful CT guided core needle core biopsy of hypermetabolic right upper lobe pulmonary mass. Electronically Signed   By: [name] M.D.   On: 03/21/2018 14:56   Dg Chest Port 1 View  Result Date:  03/21/2018 CLINICAL DATA:  Right upper lobe mass biopsy. EXAM: PORTABLE CHEST 1 VIEW COMPARISON:  Intraprocedural chest CT 03/21/2018 FINDINGS: The appearance of the right upper lobe mass is unchanged. There is no pneumothorax. No pleural effusion, focal airspace consolidation or pulmonary edema. Left chest wall single lead AICD is unchanged  with lead tip projecting over the right ventricle. IMPRESSION: No pneumothorax, status post biopsy of right upper lobe mass. Electronically Signed   By: Ulyses Jarred M.D.   On: 03/21/2018 13:40   Ir Imaging Guided Port Insertion  Result Date: 04/11/2018 CLINICAL DATA:  LUNG CANCER EXAM: RIGHT INTERNAL JUGULAR SINGLE LUMEN POWER PORT CATHETER INSERTION Date:  04/11/2018 04/11/2018 2:02 pm Radiologist:  M. Daryll Brod, MD Guidance:  Ultrasound and fluoroscopic MEDICATIONS: Ancef 2 g; The antibiotic was administered within an appropriate time interval prior to skin puncture. ANESTHESIA/SEDATION: Versed 4.0 mg IV; Fentanyl 100 mcg IV; Moderate Sedation Time:  25 minutes The patient was continuously monitored during the procedure by the interventional radiology nurse under my direct supervision. FLUOROSCOPY TIME:  One minutes, 6 seconds (24 mGy) COMPLICATIONS: None immediate. CONTRAST:  None. PROCEDURE: Informed consent was obtained from the patient following explanation of the procedure, risks, benefits and alternatives. The patient understands, agrees and consents for the procedure. All questions were addressed. A time out was performed. Maximal barrier sterile technique utilized including caps, mask, sterile gowns, sterile gloves, large sterile drape, hand hygiene, and 2% chlorhexidine scrub. Under sterile conditions and local anesthesia, right internal jugular micropuncture venous access was performed. Access was performed with ultrasound. Images were obtained for documentation of the patent right internal jugular vein. A guide wire was inserted followed by a transitional  dilator. This allowed insertion of a guide wire and catheter into the IVC. Measurements were obtained from the SVC / RA junction back to the right IJ venotomy site. In the right infraclavicular chest, a subcutaneous pocket was created over the second anterior rib. This was done under sterile conditions and local anesthesia. 1% lidocaine with epinephrine was utilized for this. A 2.5 cm incision was made in the skin. Blunt dissection was performed to create a subcutaneous pocket over the right pectoralis major muscle. The pocket was flushed with saline vigorously. There was adequate hemostasis. The port catheter was assembled and checked for leakage. The port catheter was secured in the pocket with two retention sutures. The tubing was tunneled subcutaneously to the right venotomy site and inserted into the SVC/RA junction through a valved peel-away sheath. Position was confirmed with fluoroscopy. Images were obtained for documentation. The patient tolerated the procedure well. No immediate complications. Incisions were closed in a two layer fashion with 4 - 0 Vicryl suture. Dermabond was applied to the skin. The port catheter was accessed, blood was aspirated followed by saline and heparin flushes. Needle was removed. A dry sterile dressing was applied. IMPRESSION: Ultrasound and fluoroscopically guided right internal jugular single lumen power port catheter insertion. Tip in the SVC/RA junction. Catheter ready for use. Electronically Signed   By: Jerilynn Mages.  Shick M.D.   On: 04/11/2018 14:15     Assessment & Plan:   Pleasant 57 year old patient seen in office today.  Encourage patient to keep follow-up with oncology.  Patient to follow-up in our office in 8 weeks.  Patient actively continue to work on stopping smoking.  Patient agrees.  Encourage patient to discuss with his spouse stopping smoking together or at least asking spouse to smoke outside as we know smoking is a known irritant to the lungs.  Will trial  patient on Anoro Ellipta today.  Patient was receptive to starting daily controller therapy to help with his respiratory status.  Patient knows that it will be difficult for [place] to manage his respiratory status if he continues to smoke.  COPD with chronic bronchitis and  emphysema ([place]) Anoro Ellipta sample provided today >>> 1 puff daily >>> Contact us when you are halfway through these samples and let us know how you are doing >>> We can place an order.  Continue to work on stopping smoking.  Continue use reduced to quit method.  Goal should be to be at 0.5 packs/day or less in 8 weeks.  1 [place]  >>> Patient to call this resource and utilize it to help support her quit smoking >>> Keep up your hard work with stopping smoking  You can also contact the [facility] >>>For smoking cessation classes call [phone]  We do not recommend using e-cigarettes as a form of stopping smoking  Follow-up in 8 weeks with Dr. [name] or myself  Non-small cell carcinoma of right lung, stage 3 ([place]) Continue follow-up with oncology Continue radiation Continue chemo   Tobacco abuse   Continue to work on stopping smoking.  Continue use reduced to quit method.  Goal should be to be at 0.5 packs/day or less in 8 weeks.  1 [place]  >>> Patient to call this resource and utilize it to help support her quit smoking >>> Keep up your hard work with stopping smoking  You can also contact the [facility] >>>For smoking cessation classes call [phone]  We do not recommend using e-cigarettes as a form of stopping smoking  Follow-up in 8 weeks with Dr. [name] or myself     Lauraine Rinne, NP 04/12/2018

## 2018-04-11 NOTE — H&P (Signed)
Chief Complaint: Patient was seen in consultation today for right lung mass  Referring Physician(s): [name]  Supervising Physician: Daryll Brod  Patient Status: [name] - Out-pt  History of Present Illness: [name] is a 57 y.o. male who was incidentally found to have a right lung mass.  He is known to radiology from recent biopsy 03/21/18 which showed non-small cell lung cancer.  He now has plans for treatment and is in need of durable venous access. IR consulted for Port-A-Cath placement at the request of Dr. Julien Nordmann.   He presents today in his usual state of health. He has been NPO.  He does not take blood thinners.   Past Medical History:  Diagnosis Date  . 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 Anxiety   . Asthma   . Depression   . DVT (deep venous thrombosis) ([place]) 2009; 2019   RLE; LLE  . Hepatitis C    "tx'd in 2015"  . High cholesterol   . STEMI (ST elevation myocardial infarction) ([place]) 05/26/2016  . Tobacco abuse 01/28/2016  . Urinary dribbling     Past Surgical History:  Procedure Laterality Date  . AORTOGRAM  08/08/2009   for LLE claudication     By Dr. Oneida Alar  . 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 BIOPSY N/A 05/30/2014   Procedure: BIOPSY;  Surgeon: [name], MD;  Location: [facility];  Service: Endoscopy;  Laterality: N/A;  . BLADDER TUMOR EXCISION  8/812  . CARDIAC CATHETERIZATION N/A 05/26/2016   Procedure: Left Heart Cath and Coronary Angiography;  Surgeon: [name], MD;  Location: [place] [facility];  Service: Cardiovascular;  Laterality: N/A;  . CARDIAC CATHETERIZATION N/A 05/26/2016   Procedure: Coronary Stent Intervention;  Surgeon: [name], MD;  Location: [place] [facility];  Service: Cardiovascular;  Laterality: N/A;  . COLONOSCOPY WITH PROPOFOL N/A 05/30/2014   YIR:SWNIOE colonic polyp-likely source of hematochezia-removed as described above  . ESOPHAGOGASTRODUODENOSCOPY (EGD) WITH  PROPOFOL N/A 05/30/2014   VOJ:JKKXFG and bulbar erosions s/p gastric biopsy. No evidence of portal gastropathy on today's examination.  . FEMORAL-TIBIAL BYPASS GRAFT  2009   Right side using non-reversed GSV   By Dr. Oneida Alar  . FEMORAL-TIBIAL BYPASS GRAFT  11/24/2007   Right femoral to anterior tibial BPG   by Dr. Oneida Alar  . FINGER SURGERY Left    "straightened my pinky"  . HAND TENDON SURGERY Left 2013   Left 5th finger  . HERNIA REPAIR     "stomach"  . ICD IMPLANT N/A 03/02/2018   Procedure: ICD IMPLANT;  Surgeon: [name], MD;  Location: [place] [facility];  Service: Cardiovascular;  Laterality: N/A;  . INCISIONAL HERNIA REPAIR N/A 12/25/2014   Procedure: HERNIA REPAIR INCISIONAL WITH MESH;  Surgeon: [name], MD;  Location: [facility];  Service: General;  Laterality: N/A;  . INSERTION OF MESH N/A 12/25/2014   Procedure: INSERTION OF MESH;  Surgeon: [name], MD;  Location: [facility];  Service: General;  Laterality: N/A;  . LAPAROSCOPIC CHOLECYSTECTOMY    . LOWER EXTREMITY ANGIOGRAM Left 12/30/2015   Procedure: Lower Extremity Angiogram;  Surgeon: [name], MD;  Location: [place] [facility];  Service: Cardiovascular;  Laterality: Left;  . PERIPHERAL VASCULAR CATHETERIZATION N/A 12/30/2015   Procedure: Abdominal Aortogram;  Surgeon: [name], MD;  Location: [place] [facility];  Service: Cardiovascular;  Laterality: N/A;  . POLYPECTOMY  05/30/2014   Procedure: POLYPECTOMY;  Surgeon: [name], MD;  Location: [facility];  Service: Endoscopy;;  . TONSILLECTOMY AND ADENOIDECTOMY  ~ 1970  . TYMPANOSTOMY TUBE PLACEMENT Bilateral ~ 1970    Allergies: Lyrica [pregabalin] and Neurontin [gabapentin]  Medications: Prior to Admission medications   Medication Sig Start Date End Date Taking? Authorizing Provider  alprazolam [name]) 2 MG tablet Take 2 mg by mouth 4 (four) times daily as needed for anxiety.    Yes [provider]  amitriptyline (ELAVIL) 100 MG  tablet Take 100 mg by mouth at bedtime.    Yes [provider]  aspirin 81 MG EC tablet Take 81 mg by mouth daily.     Yes [provider]  atorvastatin (LIPITOR) 80 MG tablet Take 1 tablet (80 mg total) by mouth daily at 6 PM. 05/28/16  Yes [name], NP  carvedilol (COREG) 6.25 MG tablet TAKE 1 TABLET BY MOUTH TWICE A DAY WITH A MEAL 02/27/18  Yes [name], MD  citalopram (CELEXA) 40 MG tablet Take 40 mg by mouth daily. 12/02/14  Yes [provider]  HYDROcodone-acetaminophen (NORCO) 10-325 MG tablet Take 1 tablet by mouth every 4 (four) hours as needed for moderate pain.  06/22/16  Yes [provider]  lisinopril (PRINIVIL,ZESTRIL) 2.5 MG tablet Take 1 tablet (2.5 mg total) by mouth daily. 05/28/16  Yes [name], NP  spironolactone (ALDACTONE) 25 MG tablet TAKE 1 TABLET BY MOUTH EVERY DAY 03/13/18  Yes [name], MD  tamsulosin (FLOMAX) 0.4 MG CAPS capsule Take 1 capsule by mouth daily. 01/29/18  Yes [provider]  nicotine (NICODERM CQ - DOSED IN MG/24 HOURS) 21 mg/24hr patch Place 21 mg onto the skin daily.    [provider]  nitroGLYCERIN (NITROSTAT) 0.4 MG SL tablet Place 1 tablet (0.4 mg total) under the tongue every 5 (five) minutes x 3 doses as needed for chest pain. 05/28/16   [name], NP     Family History  Problem Relation Age of Onset  . Vision loss Mother   . Ulcers Father   . Colon cancer Neg Hx     Social History   Socioeconomic History  . Marital status: Married    Spouse name: Not on file  . Number of children: Not on file  . Years of education: Not on file  . Highest education level: Not on file  Occupational History  . Not on file  Social Needs  . Financial resource strain: Not on file  . Food insecurity:    Worry: Not on file    Inability: Not on file  . Transportation needs:    Medical: Not on file    Non-medical: Not on file  Tobacco Use  . Smoking status: Current Every Day Smoker     Packs/day: 0.50    Years: 31.00    Pack years: 15.50    Types: Cigarettes  . Smokeless tobacco: Never Used  Substance and Sexual Activity  . Alcohol use: Yes    Alcohol/week: 10.8 oz    Types: 18 Cans of beer per week    Comment:  history of heavy ETOH use; 03/02/2018 "6-12 beers Fri & Sat"  . Drug use: Yes    Types: Marijuana    Comment: 03/02/2018 "weekly"  . Sexual activity: Not Currently  Lifestyle  . Physical activity:    Days per week: Not on file    Minutes per session: Not on file  . Stress: Not on file  Relationships  .  Social connections:    Talks on phone: Not on file    Gets together: Not on file    Attends religious service: Not on file    Active member of club or organization: Not on file    Attends meetings of clubs or organizations: Not on file    Relationship status: Not on file  Other Topics Concern  . Not on file  Social History Narrative  . Not on file    Review of Systems: A 12 point ROS discussed and pertinent positives are indicated in the HPI above.  All other systems are negative.  Review of Systems  Constitutional: Negative for activity change, fatigue and fever.  Respiratory: Positive for cough. Negative for shortness of breath.   Cardiovascular: Negative for chest pain.  Gastrointestinal: Negative for abdominal pain.  Musculoskeletal: Negative for back pain.  Psychiatric/Behavioral: Negative for behavioral problems and confusion.    Vital Signs: BP 126/82 (BP Location: Right Arm)   Pulse (!) 104   Temp 98.4 F (36.9 C) (Oral)   Resp 18   SpO2 96%   Physical Exam  Constitutional: He is oriented to person, place, and time. He appears well-developed. No distress.  Neck: Normal range of motion. Neck supple. No tracheal deviation present.  Cardiovascular: Normal rate, regular rhythm and normal heart sounds. Exam reveals no gallop and no friction rub.  No murmur heard. Pulmonary/Chest: Effort normal. No stridor. No respiratory distress.  He has no wheezes.  Abdominal: Soft. Bowel sounds are normal.  Musculoskeletal: Normal range of motion.  R BKA Radiation tattoos to sternum and bilateral lateral chest.   Neurological: He is alert and oriented to person, place, and time.  Skin: Skin is warm and dry. He is not diaphoretic.  Psychiatric: He has a normal mood and affect. His behavior is normal. Judgment and thought content normal.  Nursing note and vitals reviewed.   Imaging: Ct Head W Wo Contrast  Result Date: 03/31/2018 CLINICAL DATA:  New diagnosis lung cancer.  Staging. EXAM: CT HEAD WITHOUT AND WITH CONTRAST TECHNIQUE: Contiguous axial images were obtained from the base of the skull through the vertex without and with intravenous contrast CONTRAST:  70mL ISOVUE-300 IOPAMIDOL (ISOVUE-300) INJECTION 61% COMPARISON:  03/11/2005 FINDINGS: Brain: The brain shows a normal appearance without evidence of malformation, atrophy, old or acute small or large vessel infarction, mass lesion, hemorrhage, hydrocephalus or extra-axial collection. After contrast administration, no abnormal enhancement occurs. Vascular: No hyperdense vessel. No evidence of atherosclerotic calcification. Skull: Normal.  No traumatic finding.  No focal bone lesion. Sinuses/Orbits: Sinuses are clear. Orbits appear normal. Mastoids are clear. Other: Right posterior parietal scalp sebaceous cyst. IMPRESSION: Normal study.  No evidence of metastatic disease. Electronically Signed   By: Nelson Chimes M.D.   On: 03/31/2018 15:56   US Soft Tissue Head/neck  Result Date: 04/10/2018 CLINICAL DATA:  Follow-up LEFT parotid mass. History of lung cancer. EXAM: ULTRASOUND OF HEAD/NECK SOFT TISSUES TECHNIQUE: Ultrasound examination of the head and neck soft tissues was performed in the area of clinical concern. COMPARISON:  PET-CT March 10, 2018 FINDINGS: 6 x 5 x 5 mm reniform lymph node with echogenic hilum present corresponding to PET-CT finding. No additional masses. No abnormal  vascularity or echogenic sialolith. IMPRESSION: 5 mm benign-appearing lymph node LEFT parotid gland corresponding to PET-CT finding. Electronically Signed   By: Elon Alas M.D.   On: 04/10/2018 13:30   Ct Biopsy  Result Date: 03/21/2018 INDICATION: Hypermetabolic right upper lobe pulmonary mass. Please  perform CT-guided biopsy for tissue diagnostic purposes. EXAM: CT-GUIDED RIGHT UPPER LOBE MASS BIOPSY COMPARISON:  PET-CT-03/10/2018; chest CT-03/03/2018 MEDICATIONS: None. ANESTHESIA/SEDATION: Fentanyl 75 mcg IV; Versed 1.5 mg IV Sedation time: 60 minutes; The patient was continuously monitored during the procedure by the interventional radiology nurse under my direct supervision. CONTRAST:  None COMPLICATIONS: None immediate. PROCEDURE: Informed consent was obtained from the patient following an explanation of the procedure, risks, benefits and alternatives. The patient understands,agrees and consents for the procedure. All questions were addressed. A time out was performed prior to the initiation of the procedure. The patient was positioned supine on the CT table and a limited chest CT was performed for procedural planning demonstrating grossly unchanged size and appearance of macrolobulated approximately 8.1 x 7.0 cm right upper lobe mass (image 24, series 2). The operative site was prepped and draped in the usual sterile fashion. Under sterile conditions and local anesthesia, a 17 gauge coaxial needle was advanced into the peripheral aspect of the nodule. Positioning was confirmed with intermittent CT fluoroscopy and followed by the acquisition of 3 core needle biopsies with an 18 gauge core needle biopsy device. The coaxial needle was removed following deployment of a Biosentry plug and superficial hemostasis was achieved with manual compression. Limited post procedural chest CT was negative for pneumothorax or additional complication. A dressing was placed. The patient tolerated the procedure well  without immediate postprocedural complication. The patient was escorted to have an upright chest radiograph. IMPRESSION: Technically successful CT guided core needle core biopsy of hypermetabolic right upper lobe pulmonary mass. Electronically Signed   By: [name] M.D.   On: 03/21/2018 14:56   Dg Chest Port 1 View  Result Date: 03/21/2018 CLINICAL DATA:  Right upper lobe mass biopsy. EXAM: PORTABLE CHEST 1 VIEW COMPARISON:  Intraprocedural chest CT 03/21/2018 FINDINGS: The appearance of the right upper lobe mass is unchanged. There is no pneumothorax. No pleural effusion, focal airspace consolidation or pulmonary edema. Left chest wall single lead AICD is unchanged with lead tip projecting over the right ventricle. IMPRESSION: No pneumothorax, status post biopsy of right upper lobe mass. Electronically Signed   By: Ulyses Jarred M.D.   On: 03/21/2018 13:40    Labs:  CBC: Recent Labs    03/21/18 0931 04/03/18 1056 04/10/18 1226 04/11/18 1032  WBC 9.9 12.6* 6.7 11.7*  HGB 14.5 15.3 14.6 14.8  HCT 45.8 46.5 43.3 44.1  PLT 284 277 239 293    COAGS: Recent Labs    03/21/18 0931 04/11/18 1032  INR 1.04 0.93  APTT  --  30    BMP: Recent Labs    02/17/18 1247 02/27/18 1457 04/03/18 1056 04/10/18 1226  NA 141 139 137 135  K 4.6 4.8 4.2 4.4  CL 97 98 101 100  CO2 23 25 29 28   GLUCOSE 81 76 87 104*  BUN 7 7 10 10   CALCIUM 9.3 9.7 9.7 9.2  CREATININE 0.98 1.09 0.96 0.85  GFRNONAA 86 76 >60 >60  GFRAA 100 88 >60 >60    LIVER FUNCTION TESTS: Recent Labs    01/31/18 1124 04/03/18 1056 04/10/18 1226  BILITOT 0.4 0.3 0.6  AST 26 24 25   ALT 30 36 42  ALKPHOS 128* 144* 131*  PROT 6.5 7.8 7.0  ALBUMIN 4.4 4.4 4.1    TUMOR MARKERS: No results for input(s): AFPTM, CEA, CA199, CHROMGRNA in the last 8760 hours.  Assessment and Plan: Patient with past medical history of tobacco abuse, defibrillator placement 03/02/18  presents with complaint of recently diagnosed lung  cancer.  IR consulted for Port-A-Cath placement at the request of Dr. Julien Nordmann. Case reviewed by Dr. Annamaria Boots who approves patient for procedure.  Patient presents today in their usual state of health.  He has been NPO and is not currently on blood thinners.   Risks and benefits of image guided port-a-catheter placement was discussed with the patient including, but not limited to bleeding, infection, pneumothorax, or fibrin sheath development and need for additional procedures.  All of the patient's questions were answered, patient is agreeable to proceed. Consent signed and in chart.   Thank you for this interesting consult.  I greatly enjoyed meeting ELIYAS SUDDRETH and look forward to participating in their care.  A copy of this report was sent to the requesting provider on this date.  Electronically Signed: Docia Barrier, PA 04/11/2018, 11:34 AM   I spent a total of  15 Minutes   in face to face in clinical consultation, greater than 50% of which was counseling/coordinating care for lung cancer.

## 2018-04-12 ENCOUNTER — Encounter (HOSPITAL_COMMUNITY): Payer: Self-pay | Admitting: Internal Medicine

## 2018-04-12 ENCOUNTER — Ambulatory Visit
Admission: RE | Admit: 2018-04-12 | Discharge: 2018-04-12 | Disposition: A | Discharge status: Home / Self Care | Payer: Medicare Other | Source: Ambulatory Visit | Attending: Radiation Oncology | Admitting: Radiation Oncology

## 2018-04-12 ENCOUNTER — Ambulatory Visit: Payer: Medicare Other | Admitting: Gastroenterology

## 2018-04-12 ENCOUNTER — Ambulatory Visit (INDEPENDENT_AMBULATORY_CARE_PROVIDER_SITE_OTHER): Payer: Medicare Other | Admitting: Pulmonary Disease

## 2018-04-12 ENCOUNTER — Encounter: Payer: Self-pay | Admitting: Pulmonary Disease

## 2018-04-12 VITALS — BP 108/70 | HR 81 | Ht 73.0 in | Wt 215.4 lb

## 2018-04-12 DIAGNOSIS — Z720 Tobacco use: Secondary | ICD-10-CM | POA: Diagnosis not present

## 2018-04-12 DIAGNOSIS — J449 Chronic obstructive pulmonary disease, unspecified: Secondary | ICD-10-CM | POA: Diagnosis not present

## 2018-04-12 DIAGNOSIS — C3411 Malignant neoplasm of upper lobe, right bronchus or lung: Secondary | ICD-10-CM | POA: Diagnosis not present

## 2018-04-12 DIAGNOSIS — C3491 Malignant neoplasm of unspecified part of right bronchus or lung: Secondary | ICD-10-CM | POA: Diagnosis not present

## 2018-04-12 DIAGNOSIS — Z510 Encounter for antineoplastic radiation therapy: Secondary | ICD-10-CM | POA: Diagnosis not present

## 2018-04-12 MED ORDER — UMECLIDINIUM-VILANTEROL 62.5-25 MCG/INH IN AEPB
1.0000 | INHALATION_SPRAY | Freq: Every day | RESPIRATORY_TRACT | 0 refills | Status: DC
Start: 2018-04-12 — End: 2020-12-22

## 2018-04-12 NOTE — Patient Instructions (Addendum)
Anoro Ellipta sample provided today >>> 1 puff daily >>> Contact us when you are halfway through these samples and let us know how you are doing >>> We can place an order.  Continue to work on stopping smoking.  Continue use reduced to quit method.  Goal should be to be at 0.5 packs/day or less in 8 weeks.  1 [place]  >>> Patient to call this resource and utilize it to help support her quit smoking >>> Keep up your hard work with stopping smoking  You can also contact the [facility] >>>For smoking cessation classes call [phone]  We do not recommend using e-cigarettes as a form of stopping smoking   Follow-up in 8 weeks with Dr. [name] or myself  Please contact the office if your symptoms worsen or you have concerns that you are not improving.   Thank you for choosing [facility] for your healthcare, and for allowing [place] to partner with you on your healthcare journey. I am thankful to be able to provide care to you today.   Wyn Quaker FNP-C            Coping with Quitting Smoking Quitting smoking is a physical and mental challenge. You will face cravings, withdrawal symptoms, and temptation. Before quitting, work with your health care provider to make a plan that can help you cope. Preparation can help you quit and keep you from giving in. How can I cope with cravings? Cravings usually last for 5-10 minutes. If you get through it, the craving will pass. Consider taking the following actions to help you cope with cravings:  Keep your mouth busy: ? Chew sugar-free gum. ? Suck on hard candies or a straw. ? Brush your teeth.  Keep your hands and body busy: ? Immediately change to a different activity when you feel a craving. ? Squeeze or play with a ball. ? Do an activity or a hobby, like making bead jewelry, practicing needlepoint, or working with wood. ? Mix up your normal routine. ? Take a short exercise break. Go for a quick walk or  run up and down stairs. ? Spend time in public places where smoking is not allowed.  Focus on doing something kind or helpful for someone else.  Call a friend or family member to talk during a craving.  Join a support group.  Call a quit line, such as 1-800-QUIT-NOW.  Talk with your health care provider about medicines that might help you cope with cravings and make quitting easier for you.  How can I deal with withdrawal symptoms? Your body may experience negative effects as it tries to get used to not having nicotine in the system. These effects are called withdrawal symptoms. They may include:  Feeling hungrier than normal.  Trouble concentrating.  Irritability.  Trouble sleeping.  Feeling depressed.  Restlessness and agitation.  Craving a cigarette.  To manage withdrawal symptoms:  Avoid places, people, and activities that trigger your cravings.  Remember why you want to quit.  Get plenty of sleep.  Avoid coffee and other caffeinated drinks. These may worsen some of your symptoms.  How can I handle social situations? Social situations can be difficult when you are quitting smoking, especially in the first few weeks. To manage this, you can:  Avoid parties, bars, and other social situations where people might be smoking.  Avoid alcohol.  Leave right away if you have the urge to smoke.  Explain to your family and  friends that you are quitting smoking. Ask for understanding and support.  Plan activities with friends or family where smoking is not an option.  What are some ways I can cope with stress? Wanting to smoke may cause stress, and stress can make you want to smoke. Find ways to manage your stress. Relaxation techniques can help. For example:  Breathe slowly and deeply, in through your nose and out through your mouth.  Listen to soothing, relaxing music.  Talk with a family member or friend about your stress.  Light a candle.  Soak in a bath or  take a shower.  Think about a peaceful place.  What are some ways I can prevent weight gain? Be aware that many people gain weight after they quit smoking. However, not everyone does. To keep from gaining weight, have a plan in place before you quit and stick to the plan after you quit. Your plan should include:  Having healthy snacks. When you have a craving, it may help to: ? Eat plain popcorn, crunchy carrots, celery, or other cut vegetables. ? Chew sugar-free gum.  Changing how you eat: ? Eat small portion sizes at meals. ? Eat 4-6 small meals throughout the day instead of 1-2 large meals a day. ? Be mindful when you eat. Do not watch television or do other things that might distract you as you eat.  Exercising regularly: ? Make time to exercise each day. If you do not have time for a long workout, do short bouts of exercise for 5-10 minutes several times a day. ? Do some form of strengthening exercise, like weight lifting, and some form of aerobic exercise, like running or swimming.  Drinking plenty of water or other low-calorie or no-calorie drinks. Drink 6-8 glasses of water daily, or as much as instructed by your health care provider.  Summary  Quitting smoking is a physical and mental challenge. You will face cravings, withdrawal symptoms, and temptation to smoke again. Preparation can help you as you go through these challenges.  You can cope with cravings by keeping your mouth busy (such as by chewing gum), keeping your body and hands busy, and making calls to family, friends, or a helpline for people who want to quit smoking.  You can cope with withdrawal symptoms by avoiding places where people smoke, avoiding drinks with caffeine, and getting plenty of rest.  Ask your health care provider about the different ways to prevent weight gain, avoid stress, and handle social situations. This information is not intended to replace advice given to you by your health care provider.  Make sure you discuss any questions you have with your health care provider. Document Released: 09/10/2016 Document Revised: 09/10/2016 Document Reviewed: 09/10/2016 Elsevier Interactive Patient Education  [employer].

## 2018-04-12 NOTE — Assessment & Plan Note (Signed)
   Continue to work on stopping smoking.  Continue use reduced to quit method.  Goal should be to be at 0.5 packs/day or less in 8 weeks.  1 [place]  >>> Patient to call this resource and utilize it to help support her quit smoking >>> Keep up your hard work with stopping smoking  You can also contact the [place] >>>For smoking cessation classes call [phone]  We do not recommend using e-cigarettes as a form of stopping smoking  Follow-up in 8 weeks with Dr. [name] or myself

## 2018-04-12 NOTE — Assessment & Plan Note (Signed)
Anoro Ellipta sample provided today >>> 1 puff daily >>> Contact us when you are halfway through these samples and let us know how you are doing >>> We can place an order.  Continue to work on stopping smoking.  Continue use reduced to quit method.  Goal should be to be at 0.5 packs/day or less in 8 weeks.  1 [place]  >>> Patient to call this resource and utilize it to help support her quit smoking >>> Keep up your hard work with stopping smoking  You can also contact the [facility] >>>For smoking cessation classes call [phone]  We do not recommend using e-cigarettes as a form of stopping smoking  Follow-up in 8 weeks with Dr. [name] or myself

## 2018-04-12 NOTE — Assessment & Plan Note (Signed)
Continue follow-up with oncology Continue radiation Continue chemo

## 2018-04-13 ENCOUNTER — Ambulatory Visit
Admission: RE | Admit: 2018-04-13 | Discharge: 2018-04-13 | Disposition: A | Discharge status: Home / Self Care | Payer: Medicare Other | Source: Ambulatory Visit | Attending: Radiation Oncology | Admitting: Radiation Oncology

## 2018-04-13 DIAGNOSIS — C3411 Malignant neoplasm of upper lobe, right bronchus or lung: Secondary | ICD-10-CM | POA: Diagnosis not present

## 2018-04-13 DIAGNOSIS — Z510 Encounter for antineoplastic radiation therapy: Secondary | ICD-10-CM | POA: Diagnosis not present

## 2018-04-13 DIAGNOSIS — C3491 Malignant neoplasm of unspecified part of right bronchus or lung: Secondary | ICD-10-CM | POA: Diagnosis not present

## 2018-04-14 ENCOUNTER — Ambulatory Visit
Admission: RE | Admit: 2018-04-14 | Discharge: 2018-04-14 | Disposition: A | Discharge status: Home / Self Care | Payer: Medicare Other | Source: Ambulatory Visit | Attending: Radiation Oncology | Admitting: Radiation Oncology

## 2018-04-14 ENCOUNTER — Other Ambulatory Visit: Payer: Self-pay | Admitting: Radiation Oncology

## 2018-04-14 DIAGNOSIS — Z510 Encounter for antineoplastic radiation therapy: Secondary | ICD-10-CM | POA: Diagnosis not present

## 2018-04-14 DIAGNOSIS — C3490 Malignant neoplasm of unspecified part of unspecified bronchus or lung: Secondary | ICD-10-CM | POA: Diagnosis not present

## 2018-04-14 DIAGNOSIS — C3411 Malignant neoplasm of upper lobe, right bronchus or lung: Secondary | ICD-10-CM | POA: Diagnosis not present

## 2018-04-14 DIAGNOSIS — C3491 Malignant neoplasm of unspecified part of right bronchus or lung: Secondary | ICD-10-CM | POA: Diagnosis not present

## 2018-04-14 MED ORDER — SUCRALFATE 1 G PO TABS: 1.0000 g | ORAL_TABLET | Freq: Three times a day (TID) | ORAL | 2 refills | Status: DC

## 2018-04-17 ENCOUNTER — Ambulatory Visit
Admission: RE | Admit: 2018-04-17 | Discharge: 2018-04-17 | Disposition: A | Discharge status: Home / Self Care | Payer: Medicare Other | Source: Ambulatory Visit | Attending: Radiation Oncology | Admitting: Radiation Oncology

## 2018-04-17 ENCOUNTER — Inpatient Hospital Stay (HOSPITAL_BASED_OUTPATIENT_CLINIC_OR_DEPARTMENT_OTHER): Payer: Medicare Other | Admitting: Oncology

## 2018-04-17 ENCOUNTER — Encounter: Payer: Self-pay | Admitting: Oncology

## 2018-04-17 ENCOUNTER — Inpatient Hospital Stay: Payer: Medicare Other

## 2018-04-17 ENCOUNTER — Other Ambulatory Visit: Payer: Self-pay

## 2018-04-17 ENCOUNTER — Encounter (HOSPITAL_COMMUNITY): Payer: Self-pay | Admitting: Internal Medicine

## 2018-04-17 VITALS — BP 99/68 | HR 75 | Temp 97.8°F | Resp 17 | Ht 73.0 in | Wt 230.7 lb

## 2018-04-17 DIAGNOSIS — Z5111 Encounter for antineoplastic chemotherapy: Secondary | ICD-10-CM

## 2018-04-17 DIAGNOSIS — K5900 Constipation, unspecified: Secondary | ICD-10-CM | POA: Diagnosis not present

## 2018-04-17 DIAGNOSIS — R1310 Dysphagia, unspecified: Secondary | ICD-10-CM

## 2018-04-17 DIAGNOSIS — C3411 Malignant neoplasm of upper lobe, right bronchus or lung: Secondary | ICD-10-CM

## 2018-04-17 DIAGNOSIS — C3491 Malignant neoplasm of unspecified part of right bronchus or lung: Secondary | ICD-10-CM

## 2018-04-17 DIAGNOSIS — Z510 Encounter for antineoplastic radiation therapy: Secondary | ICD-10-CM | POA: Diagnosis not present

## 2018-04-17 LAB — CBC WITH DIFFERENTIAL (CANCER CENTER ONLY)
BASOS PCT: 0 %
Basophils Absolute: 0 10*3/uL (ref 0.0–0.1)
EOS ABS: 0.2 10*3/uL (ref 0.0–0.5)
EOS PCT: 3 %
HCT: 41.5 % (ref 38.4–49.9)
Hemoglobin: 13.6 g/dL (ref 13.0–17.1)
LYMPHS ABS: 0.9 10*3/uL (ref 0.9–3.3)
Lymphocytes Relative: 20 %
MCH: 30.6 pg (ref 27.2–33.4)
MCHC: 32.8 g/dL (ref 32.0–36.0)
MCV: 93.5 fL (ref 79.3–98.0)
MONOS PCT: 8 %
Monocytes Absolute: 0.4 10*3/uL (ref 0.1–0.9)
Neutro Abs: 3.1 10*3/uL (ref 1.5–6.5)
Neutrophils Relative %: 69 %
PLATELETS: 185 10*3/uL (ref 140–400)
RBC: 4.44 MIL/uL (ref 4.20–5.82)
RDW: 14 % (ref 11.0–14.6)
WBC Count: 4.6 10*3/uL (ref 4.0–10.3)

## 2018-04-17 LAB — CMP (CANCER CENTER ONLY)
ALK PHOS: 136 U/L — AB (ref 38–126)
ALT: 38 U/L (ref 0–44)
AST: 24 U/L (ref 15–41)
Albumin: 4 g/dL (ref 3.5–5.0)
Anion gap: 6 (ref 5–15)
BUN: 11 mg/dL (ref 6–20)
CALCIUM: 9.6 mg/dL (ref 8.9–10.3)
CO2: 30 mmol/L (ref 22–32)
CREATININE: 0.86 mg/dL (ref 0.61–1.24)
Chloride: 100 mmol/L (ref 98–111)
GFR, Est AFR Am: >60 mL/min (ref >60)
GFR, Estimated: >60 mL/min (ref >60)
Glucose, Bld: 98 mg/dL (ref 70–99)
Potassium: 5.2 mmol/L — ABNORMAL HIGH (ref 3.5–5.1)
SODIUM: 136 mmol/L (ref 135–145)
Total Bilirubin: 0.4 mg/dL (ref 0.3–1.2)
Total Protein: 7.1 g/dL (ref 6.5–8.1)

## 2018-04-17 MED ORDER — SODIUM CHLORIDE 0.9 % IV SOLN
45.0000 mg/m2 | Freq: Once | INTRAVENOUS | Status: AC
  Administered 2018-04-17: 108 mg via INTRAVENOUS
  Filled 2018-04-17: qty 18

## 2018-04-17 MED ORDER — DIPHENHYDRAMINE HCL 50 MG/ML IJ SOLN
INTRAMUSCULAR | Status: AC
  Filled 2018-04-17: qty 1

## 2018-04-17 MED ORDER — HEPARIN SOD (PORK) LOCK FLUSH 100 UNIT/ML IV SOLN
500.0000 [IU] | Freq: Once | INTRAVENOUS | Status: AC | PRN
  Administered 2018-04-17: 500 [IU]
  Filled 2018-04-17: qty 5

## 2018-04-17 MED ORDER — FAMOTIDINE IN NACL 20-0.9 MG/50ML-% IV SOLN
INTRAVENOUS | Status: AC
  Filled 2018-04-17: qty 50

## 2018-04-17 MED ORDER — CARBOPLATIN CHEMO INJECTION 450 MG/45ML
300.0000 mg | Freq: Once | INTRAVENOUS | Status: AC
  Administered 2018-04-17: 300 mg via INTRAVENOUS
  Filled 2018-04-17: qty 30

## 2018-04-17 MED ORDER — LIDOCAINE-PRILOCAINE 2.5-2.5 % EX CREA: 1.0000 "application " | TOPICAL_CREAM | CUTANEOUS | 0 refills | Status: DC | PRN

## 2018-04-17 MED ORDER — DIPHENHYDRAMINE HCL 50 MG/ML IJ SOLN
50.0000 mg | Freq: Once | INTRAMUSCULAR | Status: AC
  Administered 2018-04-17: 50 mg via INTRAVENOUS

## 2018-04-17 MED ORDER — FAMOTIDINE IN NACL 20-0.9 MG/50ML-% IV SOLN
20.0000 mg | Freq: Once | INTRAVENOUS | Status: AC
  Administered 2018-04-17: 20 mg via INTRAVENOUS

## 2018-04-17 MED ORDER — SODIUM CHLORIDE 0.9% FLUSH
10.0000 mL | INTRAVENOUS | Status: DC | PRN
  Administered 2018-04-17: 10 mL
  Filled 2018-04-17: qty 10

## 2018-04-17 MED ORDER — SODIUM CHLORIDE 0.9 % IV SOLN
20.0000 mg | Freq: Once | INTRAVENOUS | Status: AC
  Administered 2018-04-17: 20 mg via INTRAVENOUS
  Filled 2018-04-17: qty 2

## 2018-04-17 MED ORDER — PALONOSETRON HCL INJECTION 0.25 MG/5ML
0.2500 mg | Freq: Once | INTRAVENOUS | Status: AC
  Administered 2018-04-17: 0.25 mg via INTRAVENOUS

## 2018-04-17 MED ORDER — SODIUM CHLORIDE 0.9 % IV SOLN
Freq: Once | INTRAVENOUS | Status: AC
  Administered 2018-04-17: 15:00:00 via INTRAVENOUS

## 2018-04-17 MED ORDER — PALONOSETRON HCL INJECTION 0.25 MG/5ML
INTRAVENOUS | Status: AC
  Filled 2018-04-17: qty 5

## 2018-04-17 NOTE — Assessment & Plan Note (Signed)
This is a very pleasant 57 year old white male recently diagnosed with at least stage IIIa (T3, N2, M0) non-small cell lung cancer, pending final pathology report and histology subtype diagnosed in June 2019 and presented with large right upper lobe lung mass with questionable chest wall invasion as well as right hilar and mediastinal lymphadenopathy. The patient is currently receiving concurrent chemoradiation with weekly carboplatin for AUC of 2 and paclitaxel 45 mg/M2.   Status post 2 cycles.  He is tolerating his chemotherapy fairly well.  Labs been reviewed and are adequate for treatment today.  Recommend for him to proceed with cycle 3 of his treatment today as scheduled. He will return next week for labs and chemotherapy and have a follow-up visit in 2 weeks for evaluation prior to cycle #5.  For constipation, I recommend that he use a stool softener twice a day along with MiraLAX 1 capful mixed in water or juice daily.  For the odynophagia, recommend he continue Carafate.  The patient was advised to call immediately if he has any concerning symptoms in the interval. The patient voices understanding of current disease status and treatment options and is in agreement with the current care plan.  All questions were answered. The patient knows to call the clinic with any problems, questions or concerns. We can certainly see the patient much sooner if necessary.

## 2018-04-17 NOTE — Patient Instructions (Signed)
Constipation: Take Colace (docusate sodium) 1 tablet twice a day with MiraLax 1 capful mixed in water or juice daily.

## 2018-04-17 NOTE — Patient Instructions (Signed)
[facility] Discharge Instructions for Patients Receiving Chemotherapy  Today you received the following chemotherapy agents Taxol and Carboplatin.   To help prevent nausea and vomiting after your treatment, we encourage you to take your nausea medication as prescribed.    If you develop nausea and vomiting that is not controlled by your nausea medication, call the clinic.   BELOW ARE SYMPTOMS THAT SHOULD BE REPORTED IMMEDIATELY:  *FEVER GREATER THAN 100.5 F  *CHILLS WITH OR WITHOUT FEVER  NAUSEA AND VOMITING THAT IS NOT CONTROLLED WITH YOUR NAUSEA MEDICATION  *UNUSUAL SHORTNESS OF BREATH  *UNUSUAL BRUISING OR BLEEDING  TENDERNESS IN MOUTH AND THROAT WITH OR WITHOUT PRESENCE OF ULCERS  *URINARY PROBLEMS  *BOWEL PROBLEMS  UNUSUAL RASH Items with * indicate a potential emergency and should be followed up as soon as possible.  Feel free to call the clinic should you have any questions or concerns. The clinic phone number is [phone].  Please show the CHEMO ALERT CARD at check-in to the Emergency Department and triage nurse.   

## 2018-04-17 NOTE — Progress Notes (Signed)
[facility] OFFICE PROGRESS NOTE  [place], [place] [address] [place] [address]  DIAGNOSIS: stage IIIa (T3, N2, M0) non-small cell lung cancer, pending final pathology report and histology subtype diagnosed in June 2019 and presented with large right upper lobe lung mass with questionable chest wall invasion as well as right hilar and mediastinal lymphadenopathy.  PRIOR THERAPY: None.  CURRENT THERAPY: Concurrent chemoradiation with chemotherapy consisting of weekly carboplatin for an AUC of 2 and paclitaxel 45 mg meter squared.  First dose given on 04/03/2018.  INTERVAL HISTORY: [name] 57 y.o. male returns for routine follow-up visit accompanied by his wife and brother.  The patient is tolerating treatment fairly well with no concerning complaints except for mild odynophagia.  He uses Carafate.  Denies fevers and chills.  Denies chest pain, shortness of breath, cough, hemoptysis.  Denies nausea, vomiting, diarrhea.  He reports intermittent constipation.  The patient is here for evaluation prior to starting cycle #3 of his chemotherapy.  MEDICAL HISTORY: Past Medical History:  Diagnosis Date  . 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 Anxiety   . Asthma   . Depression   . DVT (deep venous thrombosis) ([facility]) 2009; 2019   RLE; LLE  . Hepatitis C    "tx'd in 2015"  . High cholesterol   . STEMI (ST elevation myocardial infarction) ([facility]) 05/26/2016  . Tobacco abuse 01/28/2016  . Urinary dribbling     ALLERGIES:  is allergic to lyrica [pregabalin] and neurontin [gabapentin].  MEDICATIONS:  Current Outpatient Medications  Medication Sig Dispense Refill  . alprazolam (XANAX) 2 MG tablet Take 2 mg by mouth 4 (four) times daily as needed for anxiety.     [name] amitriptyline (ELAVIL) 100 MG tablet Take 100 mg by mouth at bedtime.     [name] aspirin 81 MG EC tablet Take 81 mg by mouth daily.      [name] atorvastatin (LIPITOR) 80 MG tablet Take 1  tablet (80 mg total) by mouth daily at 6 PM. 30 tablet 12  . carvedilol (COREG) 6.25 MG tablet TAKE 1 TABLET BY MOUTH TWICE A DAY WITH A MEAL 60 tablet 6  . citalopram (CELEXA) 40 MG tablet Take 40 mg by mouth daily.  11  . HYDROcodone-acetaminophen (NORCO) 10-325 MG tablet Take 1 tablet by mouth every 4 (four) hours as needed for moderate pain.   0  . lisinopril (PRINIVIL,ZESTRIL) 2.5 MG tablet Take 1 tablet (2.5 mg total) by mouth daily. 30 tablet 12  . nitroGLYCERIN (NITROSTAT) 0.4 MG SL tablet Place 1 tablet (0.4 mg total) under the tongue every 5 (five) minutes x 3 doses as needed for chest pain. 25 tablet 2  . prochlorperazine (COMPAZINE) 10 MG tablet Take 1 tablet (10 mg total) by mouth every 6 (six) hours as needed for nausea or vomiting. 30 tablet 0  . spironolactone (ALDACTONE) 25 MG tablet TAKE 1 TABLET BY MOUTH EVERY DAY 90 tablet 3  . sucralfate (CARAFATE) 1 g tablet Take 1 tablet (1 g total) by mouth 4 (four) times daily -  with meals and at bedtime. 5 min before meals for radiation induced esophagitis 120 tablet 2  . tamsulosin (FLOMAX) 0.4 MG CAPS capsule Take 1 capsule by mouth daily.    [name] umeclidinium-vilanterol (ANORO ELLIPTA) 62.5-25 MCG/INH AEPB Inhale 1 puff into the lungs daily. 1 each 0  . lidocaine-prilocaine (EMLA) cream Apply 1 application topically as needed. 30 g 0   No current facility-administered medications for this  visit.    Facility-Administered Medications Ordered in Other Visits  Medication Dose Route Frequency Provider Last Rate Last Dose  . CARBOplatin (PARAPLATIN) 300 mg in sodium chloride 0.9 % 250 mL chemo infusion  300 mg Intravenous Once Curt Bears, MD      . dexamethasone (DECADRON) 20 mg in sodium chloride 0.9 % 50 mL IVPB  20 mg Intravenous Once Curt Bears, MD 208 mL/hr at 04/17/18 1525 20 mg at 04/17/18 1525  . heparin lock flush 100 unit/mL  500 Units Intracatheter Once PRN Curt Bears, MD      . PACLitaxel (TAXOL) 108 mg in sodium  chloride 0.9 % 250 mL chemo infusion (</= 80mg /m2)  45 mg/m2 (Treatment Plan Recorded) Intravenous Once Curt Bears, MD      . sodium chloride flush (NS) 0.9 % injection 10 mL  10 mL Intracatheter PRN Curt Bears, MD        SURGICAL HISTORY:  Past Surgical History:  Procedure Laterality Date  . AORTOGRAM  08/08/2009   for LLE claudication     By Dr. Oneida Alar  . 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 BIOPSY N/A 05/30/2014   Procedure: BIOPSY;  Surgeon: [name], MD;  Location: [facility];  Service: Endoscopy;  Laterality: N/A;  . BLADDER TUMOR EXCISION  8/812  . CARDIAC CATHETERIZATION N/A 05/26/2016   Procedure: Left Heart Cath and Coronary Angiography;  Surgeon: [name], MD;  Location: [facility];  Service: Cardiovascular;  Laterality: N/A;  . CARDIAC CATHETERIZATION N/A 05/26/2016   Procedure: Coronary Stent Intervention;  Surgeon: [name], MD;  Location: [facility];  Service: Cardiovascular;  Laterality: N/A;  . COLONOSCOPY WITH PROPOFOL N/A 05/30/2014   ZDG:[id number] colonic polyp-likely source of hematochezia-removed as described above  . ESOPHAGOGASTRODUODENOSCOPY (EGD) WITH PROPOFOL N/A 05/30/2014   VQQ:VZDGLO and bulbar erosions s/p gastric biopsy. No evidence of portal gastropathy on today's examination.  . FEMORAL-TIBIAL BYPASS GRAFT  2009   Right side using non-reversed GSV   By Dr. Oneida Alar  . FEMORAL-TIBIAL BYPASS GRAFT  11/24/2007   Right femoral to anterior tibial BPG   by Dr. Oneida Alar  . FINGER SURGERY Left    "straightened my pinky"  . HAND TENDON SURGERY Left 2013   Left 5th finger  . HERNIA REPAIR     "stomach"  . ICD IMPLANT N/A 03/02/2018   Procedure: ICD IMPLANT;  Surgeon: [name], MD;  Location: [facility];  Service: Cardiovascular;  Laterality: N/A;  . INCISIONAL HERNIA REPAIR N/A 12/25/2014   Procedure: HERNIA REPAIR INCISIONAL WITH MESH;  Surgeon: [name], MD;  Location: [facility];  Service: General;   Laterality: N/A;  . INSERTION OF MESH N/A 12/25/2014   Procedure: INSERTION OF MESH;  Surgeon: [name], MD;  Location: [facility];  Service: General;  Laterality: N/A;  . IR IMAGING GUIDED PORT INSERTION  04/11/2018  . LAPAROSCOPIC CHOLECYSTECTOMY    . LOWER EXTREMITY ANGIOGRAM Left 12/30/2015   Procedure: Lower Extremity Angiogram;  Surgeon: [name], MD;  Location: [facility];  Service: Cardiovascular;  Laterality: Left;  . PERIPHERAL VASCULAR CATHETERIZATION N/A 12/30/2015   Procedure: Abdominal Aortogram;  Surgeon: [name], MD;  Location: [facility];  Service: Cardiovascular;  Laterality: N/A;  . POLYPECTOMY  05/30/2014   Procedure: POLYPECTOMY;  Surgeon: [name], MD;  Location: [facility];  Service: Endoscopy;;  . TONSILLECTOMY AND ADENOIDECTOMY  ~ 1970  .  TYMPANOSTOMY TUBE PLACEMENT Bilateral ~ 1970    REVIEW OF SYSTEMS:   Review of Systems  Constitutional: Negative for appetite change, chills, fatigue, fever and unexpected weight change.  HENT:   Negative for mouth sores, nosebleeds, sore throat.  Positive for mild odynophagia.   Eyes: Negative for eye problems and icterus.  Respiratory: Negative for cough, hemoptysis, shortness of breath and wheezing.   Cardiovascular: Negative for chest pain and leg swelling.  Gastrointestinal: Negative for abdominal pain, diarrhea, nausea and vomiting. Positive for constipation. Genitourinary: Negative for bladder incontinence, difficulty urinating, dysuria, frequency and hematuria.   Musculoskeletal: Negative for back pain, neck pain and neck stiffness.  Skin: Negative for itching and rash.  Neurological: Negative for dizziness, extremity weakness, headaches, light-headedness and seizures.  Hematological: Negative for adenopathy. Does not bruise/bleed easily.  Psychiatric/Behavioral: Negative for confusion, depression and sleep disturbance. The patient is not nervous/anxious.     PHYSICAL EXAMINATION:  Blood  pressure 99/68, pulse 75, temperature 97.8 F (36.6 C), temperature source Oral, resp. rate 17, height 6\' 1"  (1.854 m), weight 230 lb 11.2 oz (104.6 kg), SpO2 99 %.  ECOG PERFORMANCE STATUS: 1 - Symptomatic but completely ambulatory  Physical Exam  Constitutional: Oriented to person, place, and time and well-developed, well-nourished, and in no distress. No distress.  HENT:  Head: Normocephalic and atraumatic.  Mouth/Throat: Oropharynx is clear and moist. No oropharyngeal exudate.  Eyes: Conjunctivae are normal. Right eye exhibits no discharge. Left eye exhibits no discharge. No scleral icterus.  Neck: Normal range of motion. Neck supple.  Cardiovascular: Normal rate, regular rhythm, normal heart sounds and intact distal pulses.   Pulmonary/Chest: Effort normal and breath sounds normal. No respiratory distress. No wheezes. No rales.  Abdominal: Soft. Bowel sounds are normal. Exhibits no distension and no mass. There is no tenderness.  Musculoskeletal: Normal range of motion. Exhibits no edema. Right BKA. Lymphadenopathy:    No cervical adenopathy.  Neurological: Alert and oriented to person, place, and time. Exhibits normal muscle tone. Gait normal. Coordination normal.  Skin: Skin is warm and dry. No rash noted. Not diaphoretic. No erythema. No pallor.  Psychiatric: Mood, memory and judgment normal.  Vitals reviewed.  LABORATORY DATA: Lab Results  Component Value Date   WBC 4.6 04/17/2018   HGB 13.6 04/17/2018   HCT 41.5 04/17/2018   MCV 93.5 04/17/2018   PLT 185 04/17/2018      Chemistry      Component Value Date/Time   NA 136 04/17/2018 1255   NA 139 02/27/2018 1457   K 5.2 (H) 04/17/2018 1255   CL 100 04/17/2018 1255   CO2 30 04/17/2018 1255   BUN 11 04/17/2018 1255   BUN 7 02/27/2018 1457   CREATININE 0.86 04/17/2018 1255   CREATININE 1.13 06/28/2016 1453      Component Value Date/Time   CALCIUM 9.6 04/17/2018 1255   ALKPHOS 136 (H) 04/17/2018 1255   AST 24  04/17/2018 1255   ALT 38 04/17/2018 1255   BILITOT 0.4 04/17/2018 1255       RADIOGRAPHIC STUDIES:  Ct Head W Wo Contrast  Result Date: 03/31/2018 CLINICAL DATA:  New diagnosis lung cancer.  Staging. EXAM: CT HEAD WITHOUT AND WITH CONTRAST TECHNIQUE: Contiguous axial images were obtained from the base of the skull through the vertex without and with intravenous contrast CONTRAST:  57mL ISOVUE-300 IOPAMIDOL (ISOVUE-300) INJECTION 61% COMPARISON:  03/11/2005 FINDINGS: Brain: The brain shows a normal appearance without evidence of malformation, atrophy, old or acute small or large  vessel infarction, mass lesion, hemorrhage, hydrocephalus or extra-axial collection. After contrast administration, no abnormal enhancement occurs. Vascular: No hyperdense vessel. No evidence of atherosclerotic calcification. Skull: Normal.  No traumatic finding.  No focal bone lesion. Sinuses/Orbits: Sinuses are clear. Orbits appear normal. Mastoids are clear. Other: Right posterior parietal scalp sebaceous cyst. IMPRESSION: Normal study.  No evidence of metastatic disease. Electronically Signed   By: Nelson Chimes M.D.   On: 03/31/2018 15:56   US Soft Tissue Head/neck  Result Date: 04/10/2018 CLINICAL DATA:  Follow-up LEFT parotid mass. History of lung cancer. EXAM: ULTRASOUND OF HEAD/NECK SOFT TISSUES TECHNIQUE: Ultrasound examination of the head and neck soft tissues was performed in the area of clinical concern. COMPARISON:  PET-CT March 10, 2018 FINDINGS: 6 x 5 x 5 mm reniform lymph node with echogenic hilum present corresponding to PET-CT finding. No additional masses. No abnormal vascularity or echogenic sialolith. IMPRESSION: 5 mm benign-appearing lymph node LEFT parotid gland corresponding to PET-CT finding. Electronically Signed   By: Elon Alas M.D.   On: 04/10/2018 13:30   Ct Biopsy  Result Date: 03/21/2018 INDICATION: Hypermetabolic right upper lobe pulmonary mass. Please perform CT-guided biopsy for  tissue diagnostic purposes. EXAM: CT-GUIDED RIGHT UPPER LOBE MASS BIOPSY COMPARISON:  PET-CT-03/10/2018; chest CT-03/03/2018 MEDICATIONS: None. ANESTHESIA/SEDATION: Fentanyl 75 mcg IV; Versed 1.5 mg IV Sedation time: 60 minutes; The patient was continuously monitored during the procedure by the interventional radiology nurse under my direct supervision. CONTRAST:  None COMPLICATIONS: None immediate. PROCEDURE: Informed consent was obtained from the patient following an explanation of the procedure, risks, benefits and alternatives. The patient understands,agrees and consents for the procedure. All questions were addressed. A time out was performed prior to the initiation of the procedure. The patient was positioned supine on the CT table and a limited chest CT was performed for procedural planning demonstrating grossly unchanged size and appearance of macrolobulated approximately 8.1 x 7.0 cm right upper lobe mass (image 24, series 2). The operative site was prepped and draped in the usual sterile fashion. Under sterile conditions and local anesthesia, a 17 gauge coaxial needle was advanced into the peripheral aspect of the nodule. Positioning was confirmed with intermittent CT fluoroscopy and followed by the acquisition of 3 core needle biopsies with an 18 gauge core needle biopsy device. The coaxial needle was removed following deployment of a Biosentry plug and superficial hemostasis was achieved with manual compression. Limited post procedural chest CT was negative for pneumothorax or additional complication. A dressing was placed. The patient tolerated the procedure well without immediate postprocedural complication. The patient was escorted to have an upright chest radiograph. IMPRESSION: Technically successful CT guided core needle core biopsy of hypermetabolic right upper lobe pulmonary mass. Electronically Signed   By: [name] M.D.   On: 03/21/2018 14:56   Dg Chest Port 1 View  Result Date:  03/21/2018 CLINICAL DATA:  Right upper lobe mass biopsy. EXAM: PORTABLE CHEST 1 VIEW COMPARISON:  Intraprocedural chest CT 03/21/2018 FINDINGS: The appearance of the right upper lobe mass is unchanged. There is no pneumothorax. No pleural effusion, focal airspace consolidation or pulmonary edema. Left chest wall single lead AICD is unchanged with lead tip projecting over the right ventricle. IMPRESSION: No pneumothorax, status post biopsy of right upper lobe mass. Electronically Signed   By: Ulyses Jarred M.D.   On: 03/21/2018 13:40   Ir Imaging Guided Port Insertion  Result Date: 04/11/2018 CLINICAL DATA:  LUNG CANCER EXAM: RIGHT INTERNAL JUGULAR SINGLE LUMEN POWER PORT CATHETER  INSERTION Date:  04/11/2018 04/11/2018 2:02 pm Radiologist:  M. Daryll Brod, MD Guidance:  Ultrasound and fluoroscopic MEDICATIONS: Ancef 2 g; The antibiotic was administered within an appropriate time interval prior to skin puncture. ANESTHESIA/SEDATION: Versed 4.0 mg IV; Fentanyl 100 mcg IV; Moderate Sedation Time:  25 minutes The patient was continuously monitored during the procedure by the interventional radiology nurse under my direct supervision. FLUOROSCOPY TIME:  One minutes, 6 seconds (24 mGy) COMPLICATIONS: None immediate. CONTRAST:  None. PROCEDURE: Informed consent was obtained from the patient following explanation of the procedure, risks, benefits and alternatives. The patient understands, agrees and consents for the procedure. All questions were addressed. A time out was performed. Maximal barrier sterile technique utilized including caps, mask, sterile gowns, sterile gloves, large sterile drape, hand hygiene, and 2% chlorhexidine scrub. Under sterile conditions and local anesthesia, right internal jugular micropuncture venous access was performed. Access was performed with ultrasound. Images were obtained for documentation of the patent right internal jugular vein. A guide wire was inserted followed by a transitional  dilator. This allowed insertion of a guide wire and catheter into the IVC. Measurements were obtained from the SVC / RA junction back to the right IJ venotomy site. In the right infraclavicular chest, a subcutaneous pocket was created over the second anterior rib. This was done under sterile conditions and local anesthesia. 1% lidocaine with epinephrine was utilized for this. A 2.5 cm incision was made in the skin. Blunt dissection was performed to create a subcutaneous pocket over the right pectoralis major muscle. The pocket was flushed with saline vigorously. There was adequate hemostasis. The port catheter was assembled and checked for leakage. The port catheter was secured in the pocket with two retention sutures. The tubing was tunneled subcutaneously to the right venotomy site and inserted into the SVC/RA junction through a valved peel-away sheath. Position was confirmed with fluoroscopy. Images were obtained for documentation. The patient tolerated the procedure well. No immediate complications. Incisions were closed in a two layer fashion with 4 - 0 Vicryl suture. Dermabond was applied to the skin. The port catheter was accessed, blood was aspirated followed by saline and heparin flushes. Needle was removed. A dry sterile dressing was applied. IMPRESSION: Ultrasound and fluoroscopically guided right internal jugular single lumen power port catheter insertion. Tip in the SVC/RA junction. Catheter ready for use. Electronically Signed   By: Jerilynn Mages.  Shick M.D.   On: 04/11/2018 14:15     ASSESSMENT/PLAN:  Non-small cell carcinoma of right lung, stage 3 (HCC) This is a very pleasant 57 year old white male recently diagnosed with at least stage IIIa (T3, N2, M0) non-small cell lung cancer, pending final pathology report and histology subtype diagnosed in June 2019 and presented with large right upper lobe lung mass with questionable chest wall invasion as well as right hilar and mediastinal lymphadenopathy. The  patient is currently receiving concurrent chemoradiation with weekly carboplatin for AUC of 2 and paclitaxel 45 mg/M2.   Status post 2 cycles.  He is tolerating his chemotherapy fairly well.  Labs been reviewed and are adequate for treatment today.  Recommend for him to proceed with cycle 3 of his treatment today as scheduled. He will return next week for labs and chemotherapy and have a follow-up visit in 2 weeks for evaluation prior to cycle #5.  For constipation, I recommend that he use a stool softener twice a day along with MiraLAX 1 capful mixed in water or juice daily.  For the odynophagia, recommend he  continue Carafate.  The patient was advised to call immediately if he has any concerning symptoms in the interval. The patient voices understanding of current disease status and treatment options and is in agreement with the current care plan.  All questions were answered. The patient knows to call the clinic with any problems, questions or concerns. We can certainly see the patient much sooner if necessary.   No orders of the defined types were placed in this encounter.  Mikey Bussing, DNP, AGPCNP-BC, AOCNP 04/17/18

## 2018-04-18 ENCOUNTER — Ambulatory Visit
Admission: RE | Admit: 2018-04-18 | Discharge: 2018-04-18 | Disposition: A | Discharge status: Home / Self Care | Payer: Medicare Other | Source: Ambulatory Visit | Attending: Radiation Oncology | Admitting: Radiation Oncology

## 2018-04-18 DIAGNOSIS — Z510 Encounter for antineoplastic radiation therapy: Secondary | ICD-10-CM | POA: Diagnosis not present

## 2018-04-18 DIAGNOSIS — C3491 Malignant neoplasm of unspecified part of right bronchus or lung: Secondary | ICD-10-CM | POA: Diagnosis not present

## 2018-04-18 DIAGNOSIS — C3411 Malignant neoplasm of upper lobe, right bronchus or lung: Secondary | ICD-10-CM | POA: Diagnosis not present

## 2018-04-19 ENCOUNTER — Ambulatory Visit
Admission: RE | Admit: 2018-04-19 | Discharge: 2018-04-19 | Disposition: A | Discharge status: Home / Self Care | Payer: Medicare Other | Source: Ambulatory Visit | Attending: Radiation Oncology | Admitting: Radiation Oncology

## 2018-04-19 ENCOUNTER — Encounter (HOSPITAL_COMMUNITY): Payer: Self-pay | Admitting: Internal Medicine

## 2018-04-19 DIAGNOSIS — C3411 Malignant neoplasm of upper lobe, right bronchus or lung: Secondary | ICD-10-CM | POA: Diagnosis not present

## 2018-04-19 DIAGNOSIS — Z510 Encounter for antineoplastic radiation therapy: Secondary | ICD-10-CM | POA: Diagnosis not present

## 2018-04-19 DIAGNOSIS — C3491 Malignant neoplasm of unspecified part of right bronchus or lung: Secondary | ICD-10-CM | POA: Diagnosis not present

## 2018-04-20 ENCOUNTER — Ambulatory Visit
Admission: RE | Admit: 2018-04-20 | Discharge: 2018-04-20 | Disposition: A | Discharge status: Home / Self Care | Payer: Medicare Other | Source: Ambulatory Visit | Attending: Radiation Oncology | Admitting: Radiation Oncology

## 2018-04-20 DIAGNOSIS — C3491 Malignant neoplasm of unspecified part of right bronchus or lung: Secondary | ICD-10-CM | POA: Diagnosis not present

## 2018-04-20 DIAGNOSIS — C3411 Malignant neoplasm of upper lobe, right bronchus or lung: Secondary | ICD-10-CM | POA: Diagnosis not present

## 2018-04-20 DIAGNOSIS — Z510 Encounter for antineoplastic radiation therapy: Secondary | ICD-10-CM | POA: Diagnosis not present

## 2018-04-21 ENCOUNTER — Ambulatory Visit
Admission: RE | Admit: 2018-04-21 | Discharge: 2018-04-21 | Disposition: A | Discharge status: Home / Self Care | Payer: Medicare Other | Source: Ambulatory Visit | Attending: Radiation Oncology | Admitting: Radiation Oncology

## 2018-04-21 DIAGNOSIS — C3411 Malignant neoplasm of upper lobe, right bronchus or lung: Secondary | ICD-10-CM | POA: Diagnosis not present

## 2018-04-21 DIAGNOSIS — Z510 Encounter for antineoplastic radiation therapy: Secondary | ICD-10-CM | POA: Diagnosis not present

## 2018-04-21 DIAGNOSIS — C3491 Malignant neoplasm of unspecified part of right bronchus or lung: Secondary | ICD-10-CM | POA: Diagnosis not present

## 2018-04-24 ENCOUNTER — Inpatient Hospital Stay: Payer: Medicare Other

## 2018-04-24 ENCOUNTER — Ambulatory Visit
Admission: RE | Admit: 2018-04-24 | Discharge: 2018-04-24 | Disposition: A | Discharge status: Home / Self Care | Payer: Medicare Other | Source: Ambulatory Visit | Attending: Radiation Oncology | Admitting: Radiation Oncology

## 2018-04-24 VITALS — BP 96/71 | HR 74 | Temp 98.0°F

## 2018-04-24 DIAGNOSIS — Z5111 Encounter for antineoplastic chemotherapy: Secondary | ICD-10-CM | POA: Diagnosis not present

## 2018-04-24 DIAGNOSIS — Z510 Encounter for antineoplastic radiation therapy: Secondary | ICD-10-CM | POA: Diagnosis not present

## 2018-04-24 DIAGNOSIS — Z95828 Presence of other vascular implants and grafts: Secondary | ICD-10-CM

## 2018-04-24 DIAGNOSIS — C3491 Malignant neoplasm of unspecified part of right bronchus or lung: Secondary | ICD-10-CM

## 2018-04-24 DIAGNOSIS — K5900 Constipation, unspecified: Secondary | ICD-10-CM | POA: Diagnosis not present

## 2018-04-24 DIAGNOSIS — R1310 Dysphagia, unspecified: Secondary | ICD-10-CM | POA: Diagnosis not present

## 2018-04-24 DIAGNOSIS — C3411 Malignant neoplasm of upper lobe, right bronchus or lung: Secondary | ICD-10-CM | POA: Diagnosis not present

## 2018-04-24 HISTORY — DX: Presence of other vascular implants and grafts: Z95.828

## 2018-04-24 LAB — CBC WITH DIFFERENTIAL (CANCER CENTER ONLY)
Basophils Absolute: 0 10*3/uL (ref 0.0–0.1)
Basophils Relative: 1 %
EOS ABS: 0.1 10*3/uL (ref 0.0–0.5)
Eosinophils Relative: 2 %
HEMATOCRIT: 38.9 % (ref 38.4–49.9)
HEMOGLOBIN: 13.1 g/dL (ref 13.0–17.1)
LYMPHS ABS: 0.7 10*3/uL — AB (ref 0.9–3.3)
Lymphocytes Relative: 17 %
MCH: 30.6 pg (ref 27.2–33.4)
MCHC: 33.8 g/dL (ref 32.0–36.0)
MCV: 90.6 fL (ref 79.3–98.0)
MONO ABS: 0.3 10*3/uL (ref 0.1–0.9)
MONOS PCT: 7 %
NEUTROS PCT: 73 %
Neutro Abs: 2.8 10*3/uL (ref 1.5–6.5)
PLATELETS: 174 10*3/uL (ref 140–400)
RBC: 4.3 MIL/uL (ref 4.20–5.82)
RDW: 14.1 % (ref 11.0–14.6)
WBC Count: 3.8 10*3/uL — ABNORMAL LOW (ref 4.0–10.3)

## 2018-04-24 LAB — CMP (CANCER CENTER ONLY)
ALT: 45 U/L — AB (ref 0–44)
AST: 27 U/L (ref 15–41)
Albumin: 3.8 g/dL (ref 3.5–5.0)
Alkaline Phosphatase: 138 U/L — ABNORMAL HIGH (ref 38–126)
Anion gap: 6 (ref 5–15)
BUN: 11 mg/dL (ref 6–20)
CALCIUM: 8.9 mg/dL (ref 8.9–10.3)
CHLORIDE: 100 mmol/L (ref 98–111)
CO2: 28 mmol/L (ref 22–32)
CREATININE: 0.77 mg/dL (ref 0.61–1.24)
GFR, Est AFR Am: >60 mL/min (ref >60)
Glucose, Bld: 90 mg/dL (ref 70–99)
Potassium: 4.5 mmol/L (ref 3.5–5.1)
Sodium: 134 mmol/L — ABNORMAL LOW (ref 135–145)
Total Bilirubin: 0.4 mg/dL (ref 0.3–1.2)
Total Protein: 6.7 g/dL (ref 6.5–8.1)

## 2018-04-24 MED ORDER — SODIUM CHLORIDE 0.9% FLUSH
10.0000 mL | INTRAVENOUS | Status: DC | PRN
  Administered 2018-04-24: 10 mL
  Filled 2018-04-24: qty 10

## 2018-04-24 MED ORDER — SODIUM CHLORIDE 0.9 % IV SOLN
45.0000 mg/m2 | Freq: Once | INTRAVENOUS | Status: AC
  Administered 2018-04-24: 108 mg via INTRAVENOUS
  Filled 2018-04-24: qty 18

## 2018-04-24 MED ORDER — DIPHENHYDRAMINE HCL 50 MG/ML IJ SOLN
50.0000 mg | Freq: Once | INTRAMUSCULAR | Status: AC
  Administered 2018-04-24: 50 mg via INTRAVENOUS

## 2018-04-24 MED ORDER — PALONOSETRON HCL INJECTION 0.25 MG/5ML
0.2500 mg | Freq: Once | INTRAVENOUS | Status: AC
  Administered 2018-04-24: 0.25 mg via INTRAVENOUS

## 2018-04-24 MED ORDER — SODIUM CHLORIDE 0.9 % IV SOLN
Freq: Once | INTRAVENOUS | Status: AC
  Administered 2018-04-24: 13:00:00 via INTRAVENOUS
  Filled 2018-04-24: qty 250

## 2018-04-24 MED ORDER — FAMOTIDINE IN NACL 20-0.9 MG/50ML-% IV SOLN
INTRAVENOUS | Status: AC
  Filled 2018-04-24: qty 50

## 2018-04-24 MED ORDER — SODIUM CHLORIDE 0.9% FLUSH
10.0000 mL | Freq: Once | INTRAVENOUS | Status: AC
  Administered 2018-04-24: 10 mL
  Filled 2018-04-24: qty 10

## 2018-04-24 MED ORDER — DIPHENHYDRAMINE HCL 50 MG/ML IJ SOLN
INTRAMUSCULAR | Status: AC
  Filled 2018-04-24: qty 1

## 2018-04-24 MED ORDER — PALONOSETRON HCL INJECTION 0.25 MG/5ML
INTRAVENOUS | Status: AC
  Filled 2018-04-24: qty 5

## 2018-04-24 MED ORDER — SODIUM CHLORIDE 0.9 % IV SOLN
20.0000 mg | Freq: Once | INTRAVENOUS | Status: AC
  Administered 2018-04-24: 20 mg via INTRAVENOUS
  Filled 2018-04-24: qty 2

## 2018-04-24 MED ORDER — SODIUM CHLORIDE 0.9 % IV SOLN
300.0000 mg | Freq: Once | INTRAVENOUS | Status: AC
  Administered 2018-04-24: 300 mg via INTRAVENOUS
  Filled 2018-04-24: qty 30

## 2018-04-24 MED ORDER — FAMOTIDINE IN NACL 20-0.9 MG/50ML-% IV SOLN
20.0000 mg | Freq: Once | INTRAVENOUS | Status: AC
  Administered 2018-04-24: 20 mg via INTRAVENOUS

## 2018-04-24 MED ORDER — HEPARIN SOD (PORK) LOCK FLUSH 100 UNIT/ML IV SOLN
500.0000 [IU] | Freq: Once | INTRAVENOUS | Status: AC | PRN
  Administered 2018-04-24: 500 [IU]
  Filled 2018-04-24: qty 5

## 2018-04-24 NOTE — Patient Instructions (Signed)
[facility] Discharge Instructions for Patients Receiving Chemotherapy  Today you received the following chemotherapy agents:  Taxol, Carboplatin  To help prevent nausea and vomiting after your treatment, we encourage you to take your nausea medication as prescribed.   If you develop nausea and vomiting that is not controlled by your nausea medication, call the clinic.   BELOW ARE SYMPTOMS THAT SHOULD BE REPORTED IMMEDIATELY:  *FEVER GREATER THAN 100.5 F  *CHILLS WITH OR WITHOUT FEVER  NAUSEA AND VOMITING THAT IS NOT CONTROLLED WITH YOUR NAUSEA MEDICATION  *UNUSUAL SHORTNESS OF BREATH  *UNUSUAL BRUISING OR BLEEDING  TENDERNESS IN MOUTH AND THROAT WITH OR WITHOUT PRESENCE OF ULCERS  *URINARY PROBLEMS  *BOWEL PROBLEMS  UNUSUAL RASH Items with * indicate a potential emergency and should be followed up as soon as possible.  Feel free to call the clinic should you have any questions or concerns. The clinic phone number is [phone].  Please show the CHEMO ALERT CARD at check-in to the Emergency Department and triage nurse.   

## 2018-04-25 ENCOUNTER — Ambulatory Visit
Admission: RE | Admit: 2018-04-25 | Discharge: 2018-04-25 | Disposition: A | Discharge status: Home / Self Care | Payer: Medicare Other | Source: Ambulatory Visit | Attending: Radiation Oncology | Admitting: Radiation Oncology

## 2018-04-25 DIAGNOSIS — C3411 Malignant neoplasm of upper lobe, right bronchus or lung: Secondary | ICD-10-CM | POA: Diagnosis not present

## 2018-04-25 DIAGNOSIS — Z510 Encounter for antineoplastic radiation therapy: Secondary | ICD-10-CM | POA: Diagnosis not present

## 2018-04-25 DIAGNOSIS — C3491 Malignant neoplasm of unspecified part of right bronchus or lung: Secondary | ICD-10-CM | POA: Diagnosis not present

## 2018-04-26 ENCOUNTER — Ambulatory Visit
Admission: RE | Admit: 2018-04-26 | Discharge: 2018-04-26 | Disposition: A | Discharge status: Home / Self Care | Payer: Medicare Other | Source: Ambulatory Visit | Attending: Radiation Oncology | Admitting: Radiation Oncology

## 2018-04-26 DIAGNOSIS — C3491 Malignant neoplasm of unspecified part of right bronchus or lung: Secondary | ICD-10-CM | POA: Diagnosis not present

## 2018-04-26 DIAGNOSIS — C3411 Malignant neoplasm of upper lobe, right bronchus or lung: Secondary | ICD-10-CM | POA: Diagnosis not present

## 2018-04-26 DIAGNOSIS — Z510 Encounter for antineoplastic radiation therapy: Secondary | ICD-10-CM | POA: Diagnosis not present

## 2018-04-27 ENCOUNTER — Ambulatory Visit
Admission: RE | Admit: 2018-04-27 | Discharge: 2018-04-27 | Disposition: A | Discharge status: Home / Self Care | Payer: Medicare Other | Source: Ambulatory Visit | Attending: Radiation Oncology | Admitting: Radiation Oncology

## 2018-04-27 DIAGNOSIS — C3411 Malignant neoplasm of upper lobe, right bronchus or lung: Secondary | ICD-10-CM | POA: Diagnosis not present

## 2018-04-27 DIAGNOSIS — C3491 Malignant neoplasm of unspecified part of right bronchus or lung: Secondary | ICD-10-CM | POA: Diagnosis not present

## 2018-04-28 ENCOUNTER — Ambulatory Visit: Payer: Medicare Other

## 2018-04-28 ENCOUNTER — Ambulatory Visit
Admission: RE | Admit: 2018-04-28 | Discharge: 2018-04-28 | Disposition: A | Discharge status: Home / Self Care | Payer: Medicare Other | Source: Ambulatory Visit | Attending: Radiation Oncology | Admitting: Radiation Oncology

## 2018-04-28 DIAGNOSIS — C3411 Malignant neoplasm of upper lobe, right bronchus or lung: Secondary | ICD-10-CM | POA: Diagnosis not present

## 2018-04-28 DIAGNOSIS — C3491 Malignant neoplasm of unspecified part of right bronchus or lung: Secondary | ICD-10-CM | POA: Diagnosis not present

## 2018-05-01 ENCOUNTER — Telehealth: Payer: Self-pay | Admitting: Oncology

## 2018-05-01 ENCOUNTER — Inpatient Hospital Stay: Payer: Medicare Other | Attending: Internal Medicine

## 2018-05-01 ENCOUNTER — Inpatient Hospital Stay: Payer: Medicare Other

## 2018-05-01 ENCOUNTER — Inpatient Hospital Stay (HOSPITAL_BASED_OUTPATIENT_CLINIC_OR_DEPARTMENT_OTHER): Payer: Medicare Other | Admitting: Oncology

## 2018-05-01 ENCOUNTER — Encounter: Payer: Self-pay | Admitting: Oncology

## 2018-05-01 ENCOUNTER — Ambulatory Visit
Admission: RE | Admit: 2018-05-01 | Discharge: 2018-05-01 | Disposition: A | Discharge status: Home / Self Care | Payer: Medicare Other | Source: Ambulatory Visit | Attending: Radiation Oncology | Admitting: Radiation Oncology

## 2018-05-01 ENCOUNTER — Ambulatory Visit: Payer: Medicare Other

## 2018-05-01 VITALS — BP 97/64 | HR 78 | Temp 97.9°F | Resp 17 | Ht 73.0 in | Wt 231.6 lb

## 2018-05-01 DIAGNOSIS — Z720 Tobacco use: Secondary | ICD-10-CM | POA: Insufficient documentation

## 2018-05-01 DIAGNOSIS — K5900 Constipation, unspecified: Secondary | ICD-10-CM | POA: Insufficient documentation

## 2018-05-01 DIAGNOSIS — C3491 Malignant neoplasm of unspecified part of right bronchus or lung: Secondary | ICD-10-CM

## 2018-05-01 DIAGNOSIS — R0609 Other forms of dyspnea: Secondary | ICD-10-CM | POA: Insufficient documentation

## 2018-05-01 DIAGNOSIS — R1310 Dysphagia, unspecified: Secondary | ICD-10-CM | POA: Diagnosis not present

## 2018-05-01 DIAGNOSIS — R5383 Other fatigue: Secondary | ICD-10-CM | POA: Insufficient documentation

## 2018-05-01 DIAGNOSIS — C3411 Malignant neoplasm of upper lobe, right bronchus or lung: Secondary | ICD-10-CM

## 2018-05-01 DIAGNOSIS — Z5111 Encounter for antineoplastic chemotherapy: Secondary | ICD-10-CM | POA: Insufficient documentation

## 2018-05-01 DIAGNOSIS — R05 Cough: Secondary | ICD-10-CM | POA: Insufficient documentation

## 2018-05-01 DIAGNOSIS — R634 Abnormal weight loss: Secondary | ICD-10-CM | POA: Diagnosis not present

## 2018-05-01 DIAGNOSIS — Z95828 Presence of other vascular implants and grafts: Secondary | ICD-10-CM

## 2018-05-01 LAB — CBC WITH DIFFERENTIAL (CANCER CENTER ONLY)
BASOS ABS: 0 10*3/uL (ref 0.0–0.1)
BASOS PCT: 1 %
Eosinophils Absolute: 0 10*3/uL (ref 0.0–0.5)
Eosinophils Relative: 1 %
HEMATOCRIT: 36.6 % — AB (ref 38.4–49.9)
HEMOGLOBIN: 12.4 g/dL — AB (ref 13.0–17.1)
Lymphocytes Relative: 15 %
Lymphs Abs: 0.5 10*3/uL — ABNORMAL LOW (ref 0.9–3.3)
MCH: 30.8 pg (ref 27.2–33.4)
MCHC: 34 g/dL (ref 32.0–36.0)
MCV: 90.6 fL (ref 79.3–98.0)
MONO ABS: 0.3 10*3/uL (ref 0.1–0.9)
Monocytes Relative: 9 %
NEUTROS ABS: 2.3 10*3/uL (ref 1.5–6.5)
NEUTROS PCT: 74 %
Platelet Count: 114 10*3/uL — ABNORMAL LOW (ref 140–400)
RBC: 4.04 MIL/uL — ABNORMAL LOW (ref 4.20–5.82)
RDW: 14.6 % (ref 11.0–14.6)
WBC Count: 3.2 10*3/uL — ABNORMAL LOW (ref 4.0–10.3)

## 2018-05-01 LAB — CMP (CANCER CENTER ONLY)
ALK PHOS: 114 U/L (ref 38–126)
ALT: 37 U/L (ref 0–44)
ANION GAP: 9 (ref 5–15)
AST: 23 U/L (ref 15–41)
Albumin: 3.7 g/dL (ref 3.5–5.0)
BILIRUBIN TOTAL: 0.5 mg/dL (ref 0.3–1.2)
BUN: 11 mg/dL (ref 6–20)
CALCIUM: 8.7 mg/dL — AB (ref 8.9–10.3)
CO2: 26 mmol/L (ref 22–32)
Chloride: 99 mmol/L (ref 98–111)
Creatinine: 0.84 mg/dL (ref 0.61–1.24)
GFR, Est AFR Am: >60 mL/min (ref >60)
GFR, Estimated: >60 mL/min (ref >60)
Glucose, Bld: 93 mg/dL (ref 70–99)
Potassium: 4.4 mmol/L (ref 3.5–5.1)
Sodium: 134 mmol/L — ABNORMAL LOW (ref 135–145)
TOTAL PROTEIN: 6.6 g/dL (ref 6.5–8.1)

## 2018-05-01 MED ORDER — SODIUM CHLORIDE 0.9 % IV SOLN
Freq: Once | INTRAVENOUS | Status: AC
  Administered 2018-05-01: 15:00:00 via INTRAVENOUS
  Filled 2018-05-01: qty 250

## 2018-05-01 MED ORDER — SODIUM CHLORIDE 0.9 % IV SOLN
300.0000 mg | Freq: Once | INTRAVENOUS | Status: AC
  Administered 2018-05-01: 300 mg via INTRAVENOUS
  Filled 2018-05-01: qty 30

## 2018-05-01 MED ORDER — FAMOTIDINE IN NACL 20-0.9 MG/50ML-% IV SOLN
INTRAVENOUS | Status: AC
  Filled 2018-05-01: qty 50

## 2018-05-01 MED ORDER — DIPHENHYDRAMINE HCL 50 MG/ML IJ SOLN
INTRAMUSCULAR | Status: AC
  Filled 2018-05-01: qty 1

## 2018-05-01 MED ORDER — DIPHENHYDRAMINE HCL 50 MG/ML IJ SOLN
50.0000 mg | Freq: Once | INTRAMUSCULAR | Status: AC
  Administered 2018-05-01: 50 mg via INTRAVENOUS

## 2018-05-01 MED ORDER — PALONOSETRON HCL INJECTION 0.25 MG/5ML
0.2500 mg | Freq: Once | INTRAVENOUS | Status: AC
  Administered 2018-05-01: 0.25 mg via INTRAVENOUS

## 2018-05-01 MED ORDER — FAMOTIDINE IN NACL 20-0.9 MG/50ML-% IV SOLN
20.0000 mg | Freq: Once | INTRAVENOUS | Status: AC
  Administered 2018-05-01: 20 mg via INTRAVENOUS

## 2018-05-01 MED ORDER — HEPARIN SOD (PORK) LOCK FLUSH 100 UNIT/ML IV SOLN
500.0000 [IU] | Freq: Once | INTRAVENOUS | Status: AC | PRN
  Administered 2018-05-01: 500 [IU]
  Filled 2018-05-01: qty 5

## 2018-05-01 MED ORDER — SODIUM CHLORIDE 0.9 % IV SOLN
45.0000 mg/m2 | Freq: Once | INTRAVENOUS | Status: AC
  Administered 2018-05-01: 108 mg via INTRAVENOUS
  Filled 2018-05-01: qty 18

## 2018-05-01 MED ORDER — SODIUM CHLORIDE 0.9% FLUSH
10.0000 mL | Freq: Once | INTRAVENOUS | Status: AC
  Administered 2018-05-01: 10 mL
  Filled 2018-05-01: qty 10

## 2018-05-01 MED ORDER — SODIUM CHLORIDE 0.9 % IV SOLN
20.0000 mg | Freq: Once | INTRAVENOUS | Status: AC
  Administered 2018-05-01: 20 mg via INTRAVENOUS
  Filled 2018-05-01: qty 2

## 2018-05-01 MED ORDER — PALONOSETRON HCL INJECTION 0.25 MG/5ML
INTRAVENOUS | Status: AC
  Filled 2018-05-01: qty 5

## 2018-05-01 MED ORDER — SODIUM CHLORIDE 0.9% FLUSH
10.0000 mL | INTRAVENOUS | Status: DC | PRN
  Administered 2018-05-01: 10 mL
  Filled 2018-05-01: qty 10

## 2018-05-01 NOTE — Telephone Encounter (Signed)
Appt already scheduled per 8/5 los - no additional appts added.

## 2018-05-01 NOTE — Progress Notes (Signed)
[name] OFFICE PROGRESS NOTE  [place] School, MD [address] [place] [address]  DIAGNOSIS: stage IIIa (T3, N2, M0) non-small cell lung cancer, pending final pathology report and histology subtype diagnosed in June 2019 and presented with large right upper lobe lung mass with questionable chest wall invasion as well as right hilar and mediastinal lymphadenopathy.  PRIOR THERAPY: None.  CURRENT THERAPY: Concurrent chemoradiation with chemotherapy consisting of weekly carboplatin for an AUC of 2 and paclitaxel 45 mg meter squared.  First dose given on 04/03/2018.  Status post 4 cycles.  INTERVAL HISTORY: [name] 57 y.o. male returns for routine follow-up visit accompanied by his wife and brother.  Patient is feeling fine today and has no specific complaints except for mild odynophagia.  He is using Carafate.  Denies fevers and chills.  Denies chest pain, shortness of breath, cough, hemoptysis.  Denies nausea, vomiting, diarrhea.  He reports intermittent constipation.  The patient is here for evaluation prior to starting cycle #5 of his treatment.  MEDICAL HISTORY: Past Medical History:  Diagnosis Date  . 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 Anxiety   . Asthma   . Depression   . DVT (deep venous thrombosis) ([facility]) 2009; 2019   RLE; LLE  . Hepatitis C    "tx'd in 2015"  . High cholesterol   . STEMI (ST elevation myocardial infarction) ([facility]) 05/26/2016  . Tobacco abuse 01/28/2016  . Urinary dribbling     ALLERGIES:  is allergic to lyrica [pregabalin] and neurontin [gabapentin].  MEDICATIONS:  Current Outpatient Medications  Medication Sig Dispense Refill  . alprazolam (XANAX) 2 MG tablet Take 2 mg by mouth 4 (four) times daily as needed for anxiety.     [name] amitriptyline (ELAVIL) 100 MG tablet Take 100 mg by mouth at bedtime.     [name] aspirin 81 MG EC tablet Take 81 mg by mouth daily.      [name] atorvastatin (LIPITOR) 80 MG tablet  Take 1 tablet (80 mg total) by mouth daily at 6 PM. 30 tablet 12  . carvedilol (COREG) 6.25 MG tablet TAKE 1 TABLET BY MOUTH TWICE A DAY WITH A MEAL 60 tablet 6  . citalopram (CELEXA) 40 MG tablet Take 40 mg by mouth daily.  11  . HYDROcodone-acetaminophen (NORCO) 10-325 MG tablet Take 1 tablet by mouth every 4 (four) hours as needed for moderate pain.   0  . lidocaine-prilocaine (EMLA) cream Apply 1 application topically as needed. 30 g 0  . lisinopril (PRINIVIL,ZESTRIL) 2.5 MG tablet Take 1 tablet (2.5 mg total) by mouth daily. 30 tablet 12  . nitroGLYCERIN (NITROSTAT) 0.4 MG SL tablet Place 1 tablet (0.4 mg total) under the tongue every 5 (five) minutes x 3 doses as needed for chest pain. 25 tablet 2  . prochlorperazine (COMPAZINE) 10 MG tablet Take 1 tablet (10 mg total) by mouth every 6 (six) hours as needed for nausea or vomiting. 30 tablet 0  . spironolactone (ALDACTONE) 25 MG tablet TAKE 1 TABLET BY MOUTH EVERY DAY 90 tablet 3  . sucralfate (CARAFATE) 1 g tablet Take 1 tablet (1 g total) by mouth 4 (four) times daily -  with meals and at bedtime. 5 min before meals for radiation induced esophagitis 120 tablet 2  . tamsulosin (FLOMAX) 0.4 MG CAPS capsule Take 1 capsule by mouth daily.    [name] umeclidinium-vilanterol (ANORO ELLIPTA) 62.5-25 MCG/INH AEPB Inhale 1 puff into the lungs daily. 1 each 0   No  current facility-administered medications for this visit.     SURGICAL HISTORY:  Past Surgical History:  Procedure Laterality Date  . AORTOGRAM  08/08/2009   for LLE claudication     By Dr. Oneida Alar  . 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 BIOPSY N/A 05/30/2014   Procedure: BIOPSY;  Surgeon: [name], MD;  Location: [facility];  Service: Endoscopy;  Laterality: N/A;  . BLADDER TUMOR EXCISION  8/812  . CARDIAC CATHETERIZATION N/A 05/26/2016   Procedure: Left Heart Cath and Coronary Angiography;  Surgeon: [name], MD;  Location: [facility];  Service: Cardiovascular;   Laterality: N/A;  . CARDIAC CATHETERIZATION N/A 05/26/2016   Procedure: Coronary Stent Intervention;  Surgeon: [name], MD;  Location: [facility];  Service: Cardiovascular;  Laterality: N/A;  . COLONOSCOPY WITH PROPOFOL N/A 05/30/2014   QPY:PPJKDT colonic polyp-likely source of hematochezia-removed as described above  . ESOPHAGOGASTRODUODENOSCOPY (EGD) WITH PROPOFOL N/A 05/30/2014   OIZ:TIWPYK and bulbar erosions s/p gastric biopsy. No evidence of portal gastropathy on today's examination.  . FEMORAL-TIBIAL BYPASS GRAFT  2009   Right side using non-reversed GSV   By Dr. Oneida Alar  . FEMORAL-TIBIAL BYPASS GRAFT  11/24/2007   Right femoral to anterior tibial BPG   by Dr. Oneida Alar  . FINGER SURGERY Left    "straightened my pinky"  . HAND TENDON SURGERY Left 2013   Left 5th finger  . HERNIA REPAIR     "stomach"  . ICD IMPLANT N/A 03/02/2018   Procedure: ICD IMPLANT;  Surgeon: [name], MD;  Location: [facility];  Service: Cardiovascular;  Laterality: N/A;  . INCISIONAL HERNIA REPAIR N/A 12/25/2014   Procedure: HERNIA REPAIR INCISIONAL WITH MESH;  Surgeon: [name], MD;  Location: [facility];  Service: General;  Laterality: N/A;  . INSERTION OF MESH N/A 12/25/2014   Procedure: INSERTION OF MESH;  Surgeon: [name], MD;  Location: [facility];  Service: General;  Laterality: N/A;  . IR IMAGING GUIDED PORT INSERTION  04/11/2018  . LAPAROSCOPIC CHOLECYSTECTOMY    . LOWER EXTREMITY ANGIOGRAM Left 12/30/2015   Procedure: Lower Extremity Angiogram;  Surgeon: [name], MD;  Location: [facility];  Service: Cardiovascular;  Laterality: Left;  . PERIPHERAL VASCULAR CATHETERIZATION N/A 12/30/2015   Procedure: Abdominal Aortogram;  Surgeon: [name], MD;  Location: [facility];  Service: Cardiovascular;  Laterality: N/A;  . POLYPECTOMY  05/30/2014   Procedure: POLYPECTOMY;  Surgeon: [name], MD;  Location: [facility];  Service: Endoscopy;;  .  TONSILLECTOMY AND ADENOIDECTOMY  ~ 1970  . TYMPANOSTOMY TUBE PLACEMENT Bilateral ~ 1970    REVIEW OF SYSTEMS:   Review of Systems  Constitutional: Negative for appetite change, chills, fatigue, fever and unexpected weight change.  HENT:   Negative for mouth sores, nosebleeds, sore throat.  Positive for mild odynophagia. Eyes: Negative for eye problems and icterus.  Respiratory: Negative for cough, hemoptysis, shortness of breath and wheezing.   Cardiovascular: Negative for chest pain and leg swelling.  Gastrointestinal: Negative for abdominal pain, diarrhea, nausea and vomiting.  Positive constipation. Genitourinary: Negative for bladder incontinence, difficulty urinating, dysuria, frequency and hematuria.   Musculoskeletal: Negative for back pain, gait problem, neck pain and neck stiffness.  Skin: Negative for itching and rash.  Neurological: Negative for dizziness, extremity weakness, gait problem, headaches, light-headedness and seizures.  Hematological: Negative for adenopathy. Does not bruise/bleed easily.  Psychiatric/Behavioral: Negative for confusion, depression  and sleep disturbance. The patient is not nervous/anxious.     PHYSICAL EXAMINATION:  Blood pressure 97/64, pulse 78, temperature 97.9 F (36.6 C), temperature source Oral, resp. rate 17, height 6\' 1"  (1.854 m), weight 231 lb 9.6 oz (105.1 kg), SpO2 99 %.  ECOG PERFORMANCE STATUS: 1 - Symptomatic but completely ambulatory  Physical Exam  Constitutional: Oriented to person, place, and time and well-developed, well-nourished, and in no distress. No distress.  HENT:  Head: Normocephalic and atraumatic.  Mouth/Throat: Oropharynx is clear and moist. No oropharyngeal exudate.  Eyes: Conjunctivae are normal. Right eye exhibits no discharge. Left eye exhibits no discharge. No scleral icterus.  Neck: Normal range of motion. Neck supple.  Cardiovascular: Normal rate, regular rhythm, normal heart sounds and intact distal pulses.    Pulmonary/Chest: Effort normal and breath sounds normal. No respiratory distress. No wheezes. No rales.  Abdominal: Soft. Bowel sounds are normal. Exhibits no distension and no mass. There is no tenderness.  Musculoskeletal: Normal range of motion. Exhibits no edema.  Right BKA.   Lymphadenopathy:    No cervical adenopathy.  Neurological: Alert and oriented to person, place, and time. Exhibits normal muscle tone. Gait normal. Coordination normal.  Skin: Skin is warm and dry. No rash noted. Not diaphoretic. No erythema. No pallor.  Psychiatric: Mood, memory and judgment normal.  Vitals reviewed.  LABORATORY DATA: Lab Results  Component Value Date   WBC 3.2 (L) 05/01/2018   HGB 12.4 (L) 05/01/2018   HCT 36.6 (L) 05/01/2018   MCV 90.6 05/01/2018   PLT 114 (L) 05/01/2018      Chemistry      Component Value Date/Time   NA 134 (L) 05/01/2018 1309   NA 139 02/27/2018 1457   K 4.4 05/01/2018 1309   CL 99 05/01/2018 1309   CO2 26 05/01/2018 1309   BUN 11 05/01/2018 1309   BUN 7 02/27/2018 1457   CREATININE 0.84 05/01/2018 1309   CREATININE 1.13 06/28/2016 1453      Component Value Date/Time   CALCIUM 8.7 (L) 05/01/2018 1309   ALKPHOS 114 05/01/2018 1309   AST 23 05/01/2018 1309   ALT 37 05/01/2018 1309   BILITOT 0.5 05/01/2018 1309       RADIOGRAPHIC STUDIES:  US Soft Tissue Head/neck  Result Date: 04/10/2018 CLINICAL DATA:  Follow-up LEFT parotid mass. History of lung cancer. EXAM: ULTRASOUND OF HEAD/NECK SOFT TISSUES TECHNIQUE: Ultrasound examination of the head and neck soft tissues was performed in the area of clinical concern. COMPARISON:  PET-CT March 10, 2018 FINDINGS: 6 x 5 x 5 mm reniform lymph node with echogenic hilum present corresponding to PET-CT finding. No additional masses. No abnormal vascularity or echogenic sialolith. IMPRESSION: 5 mm benign-appearing lymph node LEFT parotid gland corresponding to PET-CT finding. Electronically Signed   By: Elon Alas M.D.   On: 04/10/2018 13:30   Ir Imaging Guided Port Insertion  Result Date: 04/11/2018 CLINICAL DATA:  LUNG CANCER EXAM: RIGHT INTERNAL JUGULAR SINGLE LUMEN POWER PORT CATHETER INSERTION Date:  04/11/2018 04/11/2018 2:02 pm Radiologist:  M. Daryll Brod, MD Guidance:  Ultrasound and fluoroscopic MEDICATIONS: Ancef 2 g; The antibiotic was administered within an appropriate time interval prior to skin puncture. ANESTHESIA/SEDATION: Versed 4.0 mg IV; Fentanyl 100 mcg IV; Moderate Sedation Time:  25 minutes The patient was continuously monitored during the procedure by the interventional radiology nurse under my direct supervision. FLUOROSCOPY TIME:  One minutes, 6 seconds (24 mGy) COMPLICATIONS: None immediate. CONTRAST:  None. PROCEDURE: Informed consent  was obtained from the patient following explanation of the procedure, risks, benefits and alternatives. The patient understands, agrees and consents for the procedure. All questions were addressed. A time out was performed. Maximal barrier sterile technique utilized including caps, mask, sterile gowns, sterile gloves, large sterile drape, hand hygiene, and 2% chlorhexidine scrub. Under sterile conditions and local anesthesia, right internal jugular micropuncture venous access was performed. Access was performed with ultrasound. Images were obtained for documentation of the patent right internal jugular vein. A guide wire was inserted followed by a transitional dilator. This allowed insertion of a guide wire and catheter into the IVC. Measurements were obtained from the SVC / RA junction back to the right IJ venotomy site. In the right infraclavicular chest, a subcutaneous pocket was created over the second anterior rib. This was done under sterile conditions and local anesthesia. 1% lidocaine with epinephrine was utilized for this. A 2.5 cm incision was made in the skin. Blunt dissection was performed to create a subcutaneous pocket over the right  pectoralis major muscle. The pocket was flushed with saline vigorously. There was adequate hemostasis. The port catheter was assembled and checked for leakage. The port catheter was secured in the pocket with two retention sutures. The tubing was tunneled subcutaneously to the right venotomy site and inserted into the SVC/RA junction through a valved peel-away sheath. Position was confirmed with fluoroscopy. Images were obtained for documentation. The patient tolerated the procedure well. No immediate complications. Incisions were closed in a two layer fashion with 4 - 0 Vicryl suture. Dermabond was applied to the skin. The port catheter was accessed, blood was aspirated followed by saline and heparin flushes. Needle was removed. A dry sterile dressing was applied. IMPRESSION: Ultrasound and fluoroscopically guided right internal jugular single lumen power port catheter insertion. Tip in the SVC/RA junction. Catheter ready for use. Electronically Signed   By: Jerilynn Mages.  Shick M.D.   On: 04/11/2018 14:15     ASSESSMENT/PLAN:  Non-small cell carcinoma of right lung, stage 3 (HCC) This is a very pleasant 57 year old white male recently diagnosed with at least stage IIIa (T3, N2, M0) non-small cell lung cancer, pending final pathology report and histology subtype diagnosed in June 2019 and presented with large right upper lobe lung mass with questionable chest wall invasion as well as right hilar and mediastinal lymphadenopathy. The patient is currently receiving concurrent chemoradiation with weekly carboplatin for AUC of 2 and paclitaxel 45 mg/M2.  Status post 4 cycles.  He is tolerating his chemotherapy fairly well. Labs been reviewed and are adequate for treatment today. Recommend for him to proceed with cycle 5 of his treatment today as scheduled. He will return next week for labs and chemotherapy and have a follow-up visit in 2 weeks for evaluation prior to cycle #7.  For constipation, I have recommend that  he continue MiraLAX and add a stool softener twice a day..  For the odynophagia, recommend that he continue Carafate.  The patient was advised to call immediately if he has any concerning symptoms in the interval. The patient voices understanding of current disease status and treatment options and is in agreement with the current care plan.  All questions were answered. The patient knows to call the clinic with any problems, questions or concerns. We can certainly see the patient much sooner if necessary.    No orders of the defined types were placed in this encounter.    Mikey Bussing, DNP, AGPCNP-BC, AOCNP 05/01/18

## 2018-05-01 NOTE — Assessment & Plan Note (Signed)
This is a very pleasant 57 year old white male recently diagnosed with at least stage IIIa (T3, N2, M0) non-small cell lung cancer, pending final pathology report and histology subtype diagnosed in June 2019 and presented with large right upper lobe lung mass with questionable chest wall invasion as well as right hilar and mediastinal lymphadenopathy. The patient is currently receiving concurrent chemoradiation with weekly carboplatin for AUC of 2 and paclitaxel 45 mg/M2.  Status post 4 cycles.  He is tolerating his chemotherapy fairly well. Labs been reviewed and are adequate for treatment today. Recommend for him to proceed with cycle 5 of his treatment today as scheduled. He will return next week for labs and chemotherapy and have a follow-up visit in 2 weeks for evaluation prior to cycle #7.  For constipation, I have recommend that he continue MiraLAX and add a stool softener twice a day..  For the odynophagia, recommend that he continue Carafate.  The patient was advised to call immediately if he has any concerning symptoms in the interval. The patient voices understanding of current disease status and treatment options and is in agreement with the current care plan.  All questions were answered. The patient knows to call the clinic with any problems, questions or concerns. We can certainly see the patient much sooner if necessary.

## 2018-05-01 NOTE — Patient Instructions (Signed)
[facility] Discharge Instructions for Patients Receiving Chemotherapy  Today you received the following chemotherapy agents:  Taxol, Carboplatin  To help prevent nausea and vomiting after your treatment, we encourage you to take your nausea medication as prescribed.   If you develop nausea and vomiting that is not controlled by your nausea medication, call the clinic.   BELOW ARE SYMPTOMS THAT SHOULD BE REPORTED IMMEDIATELY:  *FEVER GREATER THAN 100.5 F  *CHILLS WITH OR WITHOUT FEVER  NAUSEA AND VOMITING THAT IS NOT CONTROLLED WITH YOUR NAUSEA MEDICATION  *UNUSUAL SHORTNESS OF BREATH  *UNUSUAL BRUISING OR BLEEDING  TENDERNESS IN MOUTH AND THROAT WITH OR WITHOUT PRESENCE OF ULCERS  *URINARY PROBLEMS  *BOWEL PROBLEMS  UNUSUAL RASH Items with * indicate a potential emergency and should be followed up as soon as possible.  Feel free to call the clinic should you have any questions or concerns. The clinic phone number is [phone].  Please show the CHEMO ALERT CARD at check-in to the Emergency Department and triage nurse.   

## 2018-05-02 ENCOUNTER — Ambulatory Visit
Admission: RE | Admit: 2018-05-02 | Discharge: 2018-05-02 | Disposition: A | Discharge status: Home / Self Care | Payer: Medicare Other | Source: Ambulatory Visit | Attending: Radiation Oncology | Admitting: Radiation Oncology

## 2018-05-02 ENCOUNTER — Ambulatory Visit: Payer: Medicare Other

## 2018-05-02 DIAGNOSIS — C3491 Malignant neoplasm of unspecified part of right bronchus or lung: Secondary | ICD-10-CM | POA: Diagnosis not present

## 2018-05-02 DIAGNOSIS — C3411 Malignant neoplasm of upper lobe, right bronchus or lung: Secondary | ICD-10-CM | POA: Diagnosis not present

## 2018-05-03 ENCOUNTER — Ambulatory Visit: Payer: Medicare Other

## 2018-05-03 ENCOUNTER — Ambulatory Visit
Admission: RE | Admit: 2018-05-03 | Discharge: 2018-05-03 | Disposition: A | Discharge status: Home / Self Care | Payer: Medicare Other | Source: Ambulatory Visit | Attending: Radiation Oncology | Admitting: Radiation Oncology

## 2018-05-03 DIAGNOSIS — C3491 Malignant neoplasm of unspecified part of right bronchus or lung: Secondary | ICD-10-CM | POA: Diagnosis not present

## 2018-05-03 DIAGNOSIS — C3411 Malignant neoplasm of upper lobe, right bronchus or lung: Secondary | ICD-10-CM | POA: Diagnosis not present

## 2018-05-04 ENCOUNTER — Ambulatory Visit: Payer: Medicare Other

## 2018-05-04 ENCOUNTER — Ambulatory Visit
Admission: RE | Admit: 2018-05-04 | Discharge: 2018-05-04 | Disposition: A | Discharge status: Home / Self Care | Payer: Medicare Other | Source: Ambulatory Visit | Attending: Radiation Oncology | Admitting: Radiation Oncology

## 2018-05-04 DIAGNOSIS — C3491 Malignant neoplasm of unspecified part of right bronchus or lung: Secondary | ICD-10-CM | POA: Diagnosis not present

## 2018-05-04 DIAGNOSIS — C3411 Malignant neoplasm of upper lobe, right bronchus or lung: Secondary | ICD-10-CM | POA: Diagnosis not present

## 2018-05-05 ENCOUNTER — Ambulatory Visit: Payer: Medicare Other

## 2018-05-05 ENCOUNTER — Ambulatory Visit
Admission: RE | Admit: 2018-05-05 | Discharge: 2018-05-05 | Disposition: A | Discharge status: Home / Self Care | Payer: Medicare Other | Source: Ambulatory Visit | Attending: Radiation Oncology | Admitting: Radiation Oncology

## 2018-05-05 DIAGNOSIS — C3411 Malignant neoplasm of upper lobe, right bronchus or lung: Secondary | ICD-10-CM | POA: Diagnosis not present

## 2018-05-05 DIAGNOSIS — C3491 Malignant neoplasm of unspecified part of right bronchus or lung: Secondary | ICD-10-CM | POA: Diagnosis not present

## 2018-05-08 ENCOUNTER — Inpatient Hospital Stay: Payer: Medicare Other

## 2018-05-08 ENCOUNTER — Ambulatory Visit
Admission: RE | Admit: 2018-05-08 | Discharge: 2018-05-08 | Disposition: A | Discharge status: Home / Self Care | Payer: Medicare Other | Source: Ambulatory Visit | Attending: Radiation Oncology | Admitting: Radiation Oncology

## 2018-05-08 VITALS — BP 106/73 | HR 88 | Temp 98.4°F | Resp 16 | Ht 73.0 in | Wt 231.3 lb

## 2018-05-08 DIAGNOSIS — R05 Cough: Secondary | ICD-10-CM | POA: Diagnosis not present

## 2018-05-08 DIAGNOSIS — C3411 Malignant neoplasm of upper lobe, right bronchus or lung: Secondary | ICD-10-CM | POA: Diagnosis not present

## 2018-05-08 DIAGNOSIS — C3491 Malignant neoplasm of unspecified part of right bronchus or lung: Secondary | ICD-10-CM | POA: Diagnosis not present

## 2018-05-08 DIAGNOSIS — R5383 Other fatigue: Secondary | ICD-10-CM | POA: Diagnosis not present

## 2018-05-08 DIAGNOSIS — Z95828 Presence of other vascular implants and grafts: Secondary | ICD-10-CM

## 2018-05-08 DIAGNOSIS — Z5111 Encounter for antineoplastic chemotherapy: Secondary | ICD-10-CM | POA: Diagnosis not present

## 2018-05-08 DIAGNOSIS — Z720 Tobacco use: Secondary | ICD-10-CM | POA: Diagnosis not present

## 2018-05-08 DIAGNOSIS — R1310 Dysphagia, unspecified: Secondary | ICD-10-CM | POA: Diagnosis not present

## 2018-05-08 DIAGNOSIS — R0609 Other forms of dyspnea: Secondary | ICD-10-CM | POA: Diagnosis not present

## 2018-05-08 DIAGNOSIS — K5900 Constipation, unspecified: Secondary | ICD-10-CM | POA: Diagnosis not present

## 2018-05-08 LAB — CBC WITH DIFFERENTIAL (CANCER CENTER ONLY)
Basophils Absolute: 0 10*3/uL (ref 0.0–0.1)
Basophils Relative: 1 %
EOS PCT: 1 %
Eosinophils Absolute: 0 10*3/uL (ref 0.0–0.5)
HCT: 35 % — ABNORMAL LOW (ref 38.4–49.9)
Hemoglobin: 11.9 g/dL — ABNORMAL LOW (ref 13.0–17.1)
LYMPHS ABS: 0.4 10*3/uL — AB (ref 0.9–3.3)
LYMPHS PCT: 14 %
MCH: 30.6 pg (ref 27.2–33.4)
MCHC: 34 g/dL (ref 32.0–36.0)
MCV: 90 fL (ref 79.3–98.0)
Monocytes Absolute: 0.2 10*3/uL (ref 0.1–0.9)
Monocytes Relative: 9 %
Neutro Abs: 2 10*3/uL (ref 1.5–6.5)
Neutrophils Relative %: 75 %
PLATELETS: 86 10*3/uL — AB (ref 140–400)
RBC: 3.89 MIL/uL — ABNORMAL LOW (ref 4.20–5.82)
RDW: 14.7 % — ABNORMAL HIGH (ref 11.0–14.6)
WBC: 2.6 10*3/uL — AB (ref 4.0–10.3)

## 2018-05-08 LAB — CMP (CANCER CENTER ONLY)
ALT: 35 U/L (ref 0–44)
ANION GAP: 7 (ref 5–15)
AST: 21 U/L (ref 15–41)
Albumin: 3.5 g/dL (ref 3.5–5.0)
Alkaline Phosphatase: 112 U/L (ref 38–126)
BUN: 11 mg/dL (ref 6–20)
CHLORIDE: 101 mmol/L (ref 98–111)
CO2: 26 mmol/L (ref 22–32)
Calcium: 8.4 mg/dL — ABNORMAL LOW (ref 8.9–10.3)
Creatinine: 0.82 mg/dL (ref 0.61–1.24)
GFR, Estimated: >60 mL/min (ref >60)
Glucose, Bld: 131 mg/dL — ABNORMAL HIGH (ref 70–99)
POTASSIUM: 4.1 mmol/L (ref 3.5–5.1)
Sodium: 134 mmol/L — ABNORMAL LOW (ref 135–145)
Total Bilirubin: 0.6 mg/dL (ref 0.3–1.2)
Total Protein: 6.5 g/dL (ref 6.5–8.1)

## 2018-05-08 MED ORDER — SODIUM CHLORIDE 0.9% FLUSH
10.0000 mL | Freq: Once | INTRAVENOUS | Status: AC
Start: 2018-05-08 — End: 2018-05-08
  Administered 2018-05-08: 10 mL
  Filled 2018-05-08: qty 10

## 2018-05-08 MED ORDER — HEPARIN SOD (PORK) LOCK FLUSH 100 UNIT/ML IV SOLN
500.0000 [IU] | Freq: Once | INTRAVENOUS | Status: AC
  Administered 2018-05-08: 500 [IU]
  Filled 2018-05-08: qty 5

## 2018-05-08 MED ORDER — SODIUM CHLORIDE 0.9% FLUSH
10.0000 mL | Freq: Once | INTRAVENOUS | Status: AC
  Administered 2018-05-08: 10 mL
  Filled 2018-05-08: qty 10

## 2018-05-08 NOTE — Patient Instructions (Addendum)
No treatment today due to low platelets.

## 2018-05-08 NOTE — Progress Notes (Signed)
Cardiac monitoring during radiation treatment today complete. Magnet placed by therapist. No abnormal rhythm noted. Heart rate remained between 88-92 bpm. Patient tolerated radiation treatment without difficulty. Patient discharged home with wife in no distress.

## 2018-05-09 ENCOUNTER — Ambulatory Visit
Admission: RE | Admit: 2018-05-09 | Discharge: 2018-05-09 | Disposition: A | Discharge status: Home / Self Care | Payer: Medicare Other | Source: Ambulatory Visit | Attending: Radiation Oncology | Admitting: Radiation Oncology

## 2018-05-09 DIAGNOSIS — C3491 Malignant neoplasm of unspecified part of right bronchus or lung: Secondary | ICD-10-CM | POA: Diagnosis not present

## 2018-05-09 DIAGNOSIS — C3411 Malignant neoplasm of upper lobe, right bronchus or lung: Secondary | ICD-10-CM | POA: Diagnosis not present

## 2018-05-10 ENCOUNTER — Ambulatory Visit
Admission: RE | Admit: 2018-05-10 | Discharge: 2018-05-10 | Disposition: A | Discharge status: Home / Self Care | Payer: Medicare Other | Source: Ambulatory Visit | Attending: Radiation Oncology | Admitting: Radiation Oncology

## 2018-05-10 DIAGNOSIS — C3491 Malignant neoplasm of unspecified part of right bronchus or lung: Secondary | ICD-10-CM | POA: Diagnosis not present

## 2018-05-10 DIAGNOSIS — C3411 Malignant neoplasm of upper lobe, right bronchus or lung: Secondary | ICD-10-CM | POA: Diagnosis not present

## 2018-05-11 ENCOUNTER — Ambulatory Visit
Admission: RE | Admit: 2018-05-11 | Discharge: 2018-05-11 | Disposition: A | Discharge status: Home / Self Care | Payer: Medicare Other | Source: Ambulatory Visit | Attending: Radiation Oncology | Admitting: Radiation Oncology

## 2018-05-11 DIAGNOSIS — C3491 Malignant neoplasm of unspecified part of right bronchus or lung: Secondary | ICD-10-CM | POA: Diagnosis not present

## 2018-05-11 DIAGNOSIS — C3411 Malignant neoplasm of upper lobe, right bronchus or lung: Secondary | ICD-10-CM | POA: Diagnosis not present

## 2018-05-12 ENCOUNTER — Ambulatory Visit
Admission: RE | Admit: 2018-05-12 | Discharge: 2018-05-12 | Disposition: A | Discharge status: Home / Self Care | Payer: Medicare Other | Source: Ambulatory Visit | Attending: Radiation Oncology | Admitting: Radiation Oncology

## 2018-05-12 ENCOUNTER — Telehealth: Payer: Self-pay | Admitting: *Deleted

## 2018-05-12 DIAGNOSIS — C3491 Malignant neoplasm of unspecified part of right bronchus or lung: Secondary | ICD-10-CM | POA: Diagnosis not present

## 2018-05-12 DIAGNOSIS — C3411 Malignant neoplasm of upper lobe, right bronchus or lung: Secondary | ICD-10-CM | POA: Diagnosis not present

## 2018-05-12 NOTE — Progress Notes (Signed)
  Radiation Oncology         [phone] ________________________________  Name: [name] MRN: [id number]  Date: 03/28/2018  DOB: [birth year]  SIMULATION AND TREATMENT PLANNING NOTE  DIAGNOSIS:     ICD-10-CM   1. Non-small cell carcinoma of right lung, stage 3 ([place]) C34.91      Site:  chest  NARRATIVE:  The patient was brought to the [facility].  Identity was confirmed.  All relevant records and images related to the planned course of therapy were reviewed.   Written consent to proceed with treatment was confirmed which was freely given after reviewing the details related to the planned course of therapy had been reviewed with the patient.  Then, the patient was set-up in a stable reproducible  supine position for radiation therapy.  CT images were obtained.  Surface markings were placed.    Medically necessary complex treatment device(s) for immobilization:  Vac-lock bag.   The CT images were loaded into the planning software.  Then the target and avoidance structures were contoured.  Treatment planning then occurred.  The radiation prescription was entered and confirmed.  Complex treatment devices were fabricated which relate to the designed radiation treatment fields for the tomotherapy 3D treatment plan. The total number of fields will be determined by the tomotherapy sinogram. Each of these customized fields/ complex treatment devices will be used on a daily basis during the radiation course. I have requested : 3D Simulation  I have requested a DVH of the following structures: Target volume, lungs, heart, spinal cord.   The patient will undergo daily image guidance to ensure accurate localization of the target, and adequate minimize dose to the normal surrounding structures in close proximity to the target.   PLAN:  The patient will receive 60 Gy in 30 fractions initially. The patient will then receive a 6 gray boost for a final total dose of 66 Gy.  Special  treatment procedure The patient will also receive concurrent chemotherapy during the treatment. The patient may therefore experience increased toxicity or side effects and the patient will be monitored for such problems. This may require extra lab work as necessary. This therefore constitutes a special treatment procedure.   ________________________________   Jodelle Gross, MD, PhD

## 2018-05-12 NOTE — Telephone Encounter (Signed)
Oncology Nurse Navigator Documentation  Oncology Nurse Navigator Flowsheets 05/12/2018  Navigator Location [facility]  Navigator Encounter Type Telephone/I called Mr. Hild to schedule him for a smoking cessation class. I was unable to reach him but did leave a vm message for him to call me.   Telephone Outgoing Call  Treatment Phase Treatment  Barriers/Navigation Needs Education  Education Other  Interventions Education  Education Method Verbal  Acuity Level 1  Time Spent with Patient 15

## 2018-05-12 NOTE — Progress Notes (Signed)
  Radiation Oncology         (336) [phone] ________________________________  Name: [name] MRN: [id number]  Date: 03/28/2018  DOB: Oct 22, 1960  RESPIRATORY MOTION MANAGEMENT SIMULATION  NARRATIVE:  In order to account for effect of respiratory motion on target structures and other organs in the planning and delivery of radiotherapy, this patient underwent respiratory motion management simulation.  To accomplish this, when the patient was brought to the CT simulation planning suite, 4D respiratoy motion management CT images were obtained.  The CT images were loaded into the planning software.  Then, using a variety of tools including Cine, MIP, and standard views, the target volume and planning target volumes (PTV) were delineated.  Avoidance structures were contoured.  Treatment planning then occurred.  Dose volume histograms were generated and reviewed for each of the requested structure.  The resulting plan was carefully reviewed and approved today.   ------------------------------------------------  Jodelle Gross, MD, PhD

## 2018-05-15 ENCOUNTER — Ambulatory Visit
Admission: RE | Admit: 2018-05-15 | Discharge: 2018-05-15 | Disposition: A | Discharge status: Home / Self Care | Payer: Medicare Other | Source: Ambulatory Visit | Attending: Radiation Oncology | Admitting: Radiation Oncology

## 2018-05-15 ENCOUNTER — Inpatient Hospital Stay (HOSPITAL_BASED_OUTPATIENT_CLINIC_OR_DEPARTMENT_OTHER): Payer: Medicare Other | Admitting: Internal Medicine

## 2018-05-15 ENCOUNTER — Inpatient Hospital Stay: Payer: Medicare Other

## 2018-05-15 ENCOUNTER — Encounter: Payer: Self-pay | Admitting: Internal Medicine

## 2018-05-15 ENCOUNTER — Telehealth: Payer: Self-pay | Admitting: Internal Medicine

## 2018-05-15 ENCOUNTER — Ambulatory Visit: Payer: Medicare Other

## 2018-05-15 ENCOUNTER — Encounter: Payer: Self-pay | Admitting: *Deleted

## 2018-05-15 DIAGNOSIS — C3491 Malignant neoplasm of unspecified part of right bronchus or lung: Secondary | ICD-10-CM

## 2018-05-15 DIAGNOSIS — R05 Cough: Secondary | ICD-10-CM | POA: Diagnosis not present

## 2018-05-15 DIAGNOSIS — Z5111 Encounter for antineoplastic chemotherapy: Secondary | ICD-10-CM | POA: Diagnosis not present

## 2018-05-15 DIAGNOSIS — R5383 Other fatigue: Secondary | ICD-10-CM

## 2018-05-15 DIAGNOSIS — C3411 Malignant neoplasm of upper lobe, right bronchus or lung: Secondary | ICD-10-CM | POA: Diagnosis not present

## 2018-05-15 DIAGNOSIS — Z720 Tobacco use: Secondary | ICD-10-CM | POA: Diagnosis not present

## 2018-05-15 DIAGNOSIS — Z95828 Presence of other vascular implants and grafts: Secondary | ICD-10-CM

## 2018-05-15 DIAGNOSIS — R0609 Other forms of dyspnea: Secondary | ICD-10-CM | POA: Diagnosis not present

## 2018-05-15 DIAGNOSIS — K5900 Constipation, unspecified: Secondary | ICD-10-CM | POA: Diagnosis not present

## 2018-05-15 DIAGNOSIS — C3490 Malignant neoplasm of unspecified part of unspecified bronchus or lung: Secondary | ICD-10-CM

## 2018-05-15 DIAGNOSIS — R1310 Dysphagia, unspecified: Secondary | ICD-10-CM

## 2018-05-15 DIAGNOSIS — R634 Abnormal weight loss: Secondary | ICD-10-CM

## 2018-05-15 LAB — CBC WITH DIFFERENTIAL (CANCER CENTER ONLY)
BASOS PCT: 0 %
Basophils Absolute: 0 10*3/uL (ref 0.0–0.1)
EOS ABS: 0 10*3/uL (ref 0.0–0.5)
EOS PCT: 2 %
HCT: 34.3 % — ABNORMAL LOW (ref 38.4–49.9)
Hemoglobin: 11.4 g/dL — ABNORMAL LOW (ref 13.0–17.1)
LYMPHS ABS: 0.5 10*3/uL — AB (ref 0.9–3.3)
Lymphocytes Relative: 19 %
MCH: 31.1 pg (ref 27.2–33.4)
MCHC: 33.2 g/dL (ref 32.0–36.0)
MCV: 93.7 fL (ref 79.3–98.0)
MONO ABS: 0.5 10*3/uL (ref 0.1–0.9)
MONOS PCT: 20 %
Neutro Abs: 1.5 10*3/uL (ref 1.5–6.5)
Neutrophils Relative %: 59 %
Platelet Count: 156 10*3/uL (ref 140–400)
RBC: 3.66 MIL/uL — ABNORMAL LOW (ref 4.20–5.82)
RDW: 16.8 % — AB (ref 11.0–14.6)
WBC Count: 2.5 10*3/uL — ABNORMAL LOW (ref 4.0–10.3)

## 2018-05-15 LAB — CMP (CANCER CENTER ONLY)
ALT: 31 U/L (ref 0–44)
AST: 21 U/L (ref 15–41)
Albumin: 3.5 g/dL (ref 3.5–5.0)
Alkaline Phosphatase: 131 U/L — ABNORMAL HIGH (ref 38–126)
Anion gap: 7 (ref 5–15)
BUN: 6 mg/dL (ref 6–20)
CHLORIDE: 101 mmol/L (ref 98–111)
CO2: 30 mmol/L (ref 22–32)
CREATININE: 0.77 mg/dL (ref 0.61–1.24)
Calcium: 8.6 mg/dL — ABNORMAL LOW (ref 8.9–10.3)
Glucose, Bld: 95 mg/dL (ref 70–99)
POTASSIUM: 4.4 mmol/L (ref 3.5–5.1)
Sodium: 138 mmol/L (ref 135–145)
TOTAL PROTEIN: 6.5 g/dL (ref 6.5–8.1)
Total Bilirubin: 0.5 mg/dL (ref 0.3–1.2)

## 2018-05-15 MED ORDER — SODIUM CHLORIDE 0.9 % IV SOLN
20.0000 mg | Freq: Once | INTRAVENOUS | Status: AC
  Administered 2018-05-15: 20 mg via INTRAVENOUS
  Filled 2018-05-15: qty 2

## 2018-05-15 MED ORDER — PALONOSETRON HCL INJECTION 0.25 MG/5ML
0.2500 mg | Freq: Once | INTRAVENOUS | Status: AC
  Administered 2018-05-15: 0.25 mg via INTRAVENOUS

## 2018-05-15 MED ORDER — SODIUM CHLORIDE 0.9% FLUSH
10.0000 mL | INTRAVENOUS | Status: DC | PRN
  Administered 2018-05-15: 10 mL
  Filled 2018-05-15: qty 10

## 2018-05-15 MED ORDER — PALONOSETRON HCL INJECTION 0.25 MG/5ML
INTRAVENOUS | Status: AC
  Filled 2018-05-15: qty 5

## 2018-05-15 MED ORDER — HEPARIN SOD (PORK) LOCK FLUSH 100 UNIT/ML IV SOLN
500.0000 [IU] | Freq: Once | INTRAVENOUS | Status: AC | PRN
  Administered 2018-05-15: 500 [IU]
  Filled 2018-05-15: qty 5

## 2018-05-15 MED ORDER — DIPHENHYDRAMINE HCL 50 MG/ML IJ SOLN
INTRAMUSCULAR | Status: AC
  Filled 2018-05-15: qty 1

## 2018-05-15 MED ORDER — SODIUM CHLORIDE 0.9 % IV SOLN
300.0000 mg | Freq: Once | INTRAVENOUS | Status: AC
  Administered 2018-05-15: 300 mg via INTRAVENOUS
  Filled 2018-05-15: qty 30

## 2018-05-15 MED ORDER — SODIUM CHLORIDE 0.9 % IV SOLN
45.0000 mg/m2 | Freq: Once | INTRAVENOUS | Status: AC
  Administered 2018-05-15: 108 mg via INTRAVENOUS
  Filled 2018-05-15: qty 18

## 2018-05-15 MED ORDER — FAMOTIDINE IN NACL 20-0.9 MG/50ML-% IV SOLN
20.0000 mg | Freq: Once | INTRAVENOUS | Status: AC
  Administered 2018-05-15: 20 mg via INTRAVENOUS

## 2018-05-15 MED ORDER — SODIUM CHLORIDE 0.9% FLUSH
10.0000 mL | Freq: Once | INTRAVENOUS | Status: AC
  Administered 2018-05-15: 10 mL
  Filled 2018-05-15: qty 10

## 2018-05-15 MED ORDER — FAMOTIDINE IN NACL 20-0.9 MG/50ML-% IV SOLN
INTRAVENOUS | Status: AC
  Filled 2018-05-15: qty 50

## 2018-05-15 MED ORDER — DIPHENHYDRAMINE HCL 50 MG/ML IJ SOLN
50.0000 mg | Freq: Once | INTRAMUSCULAR | Status: AC
  Administered 2018-05-15: 50 mg via INTRAVENOUS

## 2018-05-15 MED ORDER — SODIUM CHLORIDE 0.9 % IV SOLN
Freq: Once | INTRAVENOUS | Status: AC
  Administered 2018-05-15: 15:00:00 via INTRAVENOUS
  Filled 2018-05-15: qty 250

## 2018-05-15 NOTE — Progress Notes (Signed)
Oncology Nurse Navigator Documentation  Oncology Nurse Navigator Flowsheets 05/15/2018  Navigator Location [facility]  Navigator Encounter Type Clinic/MDCspoke with patient and wife today.  Helped to explain treatment plan.  He will received his last chemo today, follow up in a few weeks with CT scan and have discussion regarding next steps.   I also educated him about smoking cessation.   Abnormal Finding Date 03/03/2018  Confirmed Diagnosis Date 03/21/2018  Treatment Initiated Date 03/28/2018  Patient Visit Type MedOnc  Treatment Phase Treatment  Barriers/Navigation Needs Education  Education Smoking cessation;Other  Interventions Education  Education Method Verbal  Acuity Level 1  Time Spent with Patient 15

## 2018-05-15 NOTE — Progress Notes (Signed)
[place] [phone]   [phone]  OFFICE PROGRESS NOTE  [place], [place] [address]  DIAGNOSIS:stage IIIA (T3, N2, M0) non-small cell lung cancer, adenosquamous carcinoma diagnosed in June 2019 and presented with large right upper lobe lung mass with questionable chest wall invasion as well as right hilar and mediastinal lymphadenopathy.  Biomarker Findings Tumor Mutational Burden - TMB-High (21 Muts/Mb) Microsatellite status - MS-Stable Genomic Findings For a complete list of the genes assayed, please refer to the Appendix. ATM J0932* CCND2 P281R KRAS G12V CHEK2 T330f*15 TP53 E298* 7 Disease relevant genes with no reportable alterations: ALK, EGFR, BRAF, MET, RET, ERBB2, ROS1  PRIOR THERAPY:None.  CURRENT THERAPY:Concurrent chemoradiation with chemotherapy consisting of weekly carboplatin for an AUC of 2 and paclitaxel 45 mg/m2. First dose given on 04/03/2018.  Status post 5 cycles.  INTERVAL HISTORY: Mark ADERMAN57y.o. male returns to the clinic today for return to the clinic today for follow-up visit accompanied by his wife.  The patient is feeling fine today with no concerning complaints except for mild fatigue and cough.  Unfortunately he continues to smoke at regular basis and I strongly encouraged him to quit smoking.  The patient denied having any chest pain but has shortness of breath with exertion.  He has no fever or chills.  He denied having any nausea, vomiting, diarrhea or constipation.  He has mild odynophagia and he lost few pounds since his last visit.  He is here today for evaluation before starting the last dose of his concurrent chemoradiation.  MEDICAL HISTORY: Past Medical History:  Diagnosis Date  . Acute hepatitis C virus infection 06/19/2007   Qualifier: Diagnosis of  By: MLeilani MerlCMA, Tiffany    . Acute ST elevation myocardial infarction (STEMI) involving left anterior  descending (LAD) coronary artery ([facility] [place] 05/26/2016  . Acute ST elevation myocardial infarction (STEMI) of lateral wall ([facility] 05/26/2016  . 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 Anxiety   . Asthma   . Atherosclerosis of native arteries of the extremities with intermittent claudication 12/16/2011  . Chronic hepatitis C without hepatic coma ([facility] 05/03/2014  . COPD with chronic bronchitis and emphysema ([facility] 03/13/2018   FEV1 57%  . Depression   . DVT (deep venous thrombosis) ([facility] 2009; 2019   RLE; LLE  . Encounter for antineoplastic chemotherapy 03/22/2018  . Hepatitis C    "tx'd in 2015"  . High cholesterol   . Ischemic cardiomyopathy 03/02/2018  . Non-small cell carcinoma of right lung, stage 3 ([facility] 03/22/2018  . PAD (peripheral artery disease) ([facility] 01/25/2013  . Peripheral vascular disease, unspecified 07/20/2012  . Port-A-Cath in place 04/24/2018  . ST elevation myocardial infarction involving left anterior descending (LAD) coronary artery ([facility]   . STEMI (ST elevation myocardial infarction) ([facility] 05/26/2016  . Tobacco abuse 01/28/2016  . Tubular adenoma of colon 07/11/2014  . Urinary dribbling     ALLERGIES:  is allergic to lyrica [pregabalin] and neurontin [gabapentin].  MEDICATIONS:  Current Outpatient Medications  Medication Sig Dispense Refill  . alprazolam (XANAX) 2 MG tablet Take 2 mg by mouth 4 (four) times daily as needed for anxiety.     .Marland Kitchenamitriptyline (ELAVIL) 100 MG tablet Take 100 mg by mouth at bedtime.     .Marland Kitchenaspirin 81 MG EC tablet Take 81 mg by mouth daily.      .Marland Kitchenatorvastatin (LIPITOR) 80 MG tablet Take 1 tablet (80 mg total) by  mouth daily at 6 PM. 30 tablet 12  . carvedilol (COREG) 6.25 MG tablet TAKE 1 TABLET BY MOUTH TWICE A DAY WITH A MEAL 60 tablet 6  . citalopram (CELEXA) 40 MG tablet Take 40 mg by mouth daily.  11  . HYDROcodone-acetaminophen (NORCO) 10-325 MG tablet Take 1 tablet by mouth every 4 (four) hours as needed for moderate  pain.   0  . lidocaine-prilocaine (EMLA) cream Apply 1 application topically as needed. 30 g 0  . lisinopril (PRINIVIL,ZESTRIL) 2.5 MG tablet Take 1 tablet (2.5 mg total) by mouth daily. 30 tablet 12  . nitroGLYCERIN (NITROSTAT) 0.4 MG SL tablet Place 1 tablet (0.4 mg total) under the tongue every 5 (five) minutes x 3 doses as needed for chest pain. 25 tablet 2  . prochlorperazine (COMPAZINE) 10 MG tablet Take 1 tablet (10 mg total) by mouth every 6 (six) hours as needed for nausea or vomiting. 30 tablet 0  . spironolactone (ALDACTONE) 25 MG tablet TAKE 1 TABLET BY MOUTH EVERY DAY 90 tablet 3  . sucralfate (CARAFATE) 1 g tablet Take 1 tablet (1 g total) by mouth 4 (four) times daily -  with meals and at bedtime. 5 min before meals for radiation induced esophagitis 120 tablet 2  . tamsulosin (FLOMAX) 0.4 MG CAPS capsule Take 1 capsule by mouth daily.    [name] umeclidinium-vilanterol (ANORO ELLIPTA) 62.5-25 MCG/INH AEPB Inhale 1 puff into the lungs daily. 1 each 0   No current facility-administered medications for this visit.     SURGICAL HISTORY:  Past Surgical History:  Procedure Laterality Date  . AORTOGRAM  08/08/2009   for LLE claudication     By Dr. Oneida Alar  . 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 BIOPSY N/A 05/30/2014   Procedure: BIOPSY;  Surgeon: [name], MD;  Location: [facility];  Service: Endoscopy;  Laterality: N/A;  . BLADDER TUMOR EXCISION  8/812  . CARDIAC CATHETERIZATION N/A 05/26/2016   Procedure: Left Heart Cath and Coronary Angiography;  Surgeon: [name], MD;  Location: [place] [facility];  Service: Cardiovascular;  Laterality: N/A;  . CARDIAC CATHETERIZATION N/A 05/26/2016   Procedure: Coronary Stent Intervention;  Surgeon: [name], MD;  Location: [place] [facility];  Service: Cardiovascular;  Laterality: N/A;  . COLONOSCOPY WITH PROPOFOL N/A 05/30/2014   [facility]:INOMVE colonic polyp-likely source of hematochezia-removed as described above  .  ESOPHAGOGASTRODUODENOSCOPY (EGD) WITH PROPOFOL N/A 05/30/2014   [facility]:NOBSJG and bulbar erosions s/p gastric biopsy. No evidence of portal gastropathy on today's examination.  . FEMORAL-TIBIAL BYPASS GRAFT  2009   Right side using non-reversed GSV   By Dr. Oneida Alar  . FEMORAL-TIBIAL BYPASS GRAFT  11/24/2007   Right femoral to anterior tibial BPG   by Dr. Oneida Alar  . FINGER SURGERY Left    "straightened my pinky"  . HAND TENDON SURGERY Left 2013   Left 5th finger  . HERNIA REPAIR     "stomach"  . ICD IMPLANT N/A 03/02/2018   Procedure: ICD IMPLANT;  Surgeon: [name], MD;  Location: [place] [facility];  Service: Cardiovascular;  Laterality: N/A;  . INCISIONAL HERNIA REPAIR N/A 12/25/2014   Procedure: HERNIA REPAIR INCISIONAL WITH MESH;  Surgeon: [name], MD;  Location: [facility];  Service: General;  Laterality: N/A;  . INSERTION OF MESH N/A 12/25/2014   Procedure: INSERTION OF MESH;  Surgeon: [name], MD;  Location: [facility];  Service: General;  Laterality: N/A;  . IR  IMAGING GUIDED PORT INSERTION  04/11/2018  . LAPAROSCOPIC CHOLECYSTECTOMY    . LOWER EXTREMITY ANGIOGRAM Left 12/30/2015   Procedure: Lower Extremity Angiogram;  Surgeon: [name], MD;  Location: [place] [facility];  Service: Cardiovascular;  Laterality: Left;  . PERIPHERAL VASCULAR CATHETERIZATION N/A 12/30/2015   Procedure: Abdominal Aortogram;  Surgeon: [name], MD;  Location: [place] [facility];  Service: Cardiovascular;  Laterality: N/A;  . POLYPECTOMY  05/30/2014   Procedure: POLYPECTOMY;  Surgeon: [name], MD;  Location: [facility];  Service: Endoscopy;;  . TONSILLECTOMY AND ADENOIDECTOMY  ~ 1970  . TYMPANOSTOMY TUBE PLACEMENT Bilateral ~ 1970    REVIEW OF SYSTEMS:  A comprehensive review of systems was negative except for: Constitutional: positive for fatigue and weight loss Respiratory: positive for cough   PHYSICAL EXAMINATION: General appearance: alert, cooperative and no  distress Head: Normocephalic, without obvious abnormality, atraumatic Neck: no adenopathy, no JVD, supple, symmetrical, trachea midline and thyroid not enlarged, symmetric, no tenderness/mass/nodules Lymph nodes: Cervical, supraclavicular, and axillary nodes normal. Resp: clear to auscultation bilaterally Back: symmetric, no curvature. ROM normal. No CVA tenderness. Cardio: regular rate and rhythm, S1, S2 normal, no murmur, click, rub or gallop GI: soft, non-tender; bowel sounds normal; no masses,  no organomegaly Extremities: extremities normal, atraumatic, no cyanosis or edema  ECOG PERFORMANCE STATUS: 1 - Symptomatic but completely ambulatory  Blood pressure (!) 107/59, pulse 91, temperature 97.8 F (36.6 C), temperature source Oral, resp. rate 18, height 6' 1" (1.854 m), weight 228 lb 6.4 oz (103.6 kg), SpO2 99 %.  LABORATORY DATA: Lab Results  Component Value Date   WBC 2.5 (L) 05/15/2018   HGB 11.4 (L) 05/15/2018   HCT 34.3 (L) 05/15/2018   MCV 93.7 05/15/2018   PLT 156 05/15/2018      Chemistry      Component Value Date/Time   NA 134 (L) 05/08/2018 1108   NA 139 02/27/2018 1457   K 4.1 05/08/2018 1108   CL 101 05/08/2018 1108   CO2 26 05/08/2018 1108   BUN 11 05/08/2018 1108   BUN 7 02/27/2018 1457   CREATININE 0.82 05/08/2018 1108   CREATININE 1.13 06/28/2016 1453      Component Value Date/Time   CALCIUM 8.4 (L) 05/08/2018 1108   ALKPHOS 112 05/08/2018 1108   AST 21 05/08/2018 1108   ALT 35 05/08/2018 1108   BILITOT 0.6 05/08/2018 1108       RADIOGRAPHIC STUDIES: No results found.  ASSESSMENT AND PLAN: This is a very pleasant 57 years old white male recently diagnosed with a stage IIIa non-small cell lung cancer, adenosquamous carcinoma.  He is currently undergoing a course of concurrent chemoradiation with weekly carboplatin and paclitaxel status post 5 cycles. The patient is tolerating his treatment well. I recommended for him to proceed with the last  dose of his chemotherapy today. He is expected to complete the course of palliative radiotherapy on 05/18/2018. I will see him back for follow-up visit in 4 weeks for evaluation after repeating CT scan of the chest for restaging of his disease. He was advised to call immediately if he has any concerning symptoms in the interval. The patient voices understanding of current disease status and treatment options and is in agreement with the current care plan.  All questions were answered. The patient knows to call the clinic with any problems, questions or concerns. We can certainly see the patient much sooner if necessary.  I spent 10 minutes counseling the  patient face to face. The total time spent in the appointment was 15 minutes.  Disclaimer: This note was dictated with voice recognition software. Similar sounding words can inadvertently be transcribed and may not be corrected upon review.

## 2018-05-15 NOTE — Patient Instructions (Signed)
Steps to Quit Smoking Smoking tobacco can be bad for your health. It can also affect almost every organ in your body. Smoking puts you and people around you at risk for many serious long-lasting (chronic) diseases. Quitting smoking is hard, but it is one of the best things that you can do for your health. It is never too late to quit. What are the benefits of quitting smoking? When you quit smoking, you lower your risk for getting serious diseases and conditions. They can include:  Lung cancer or lung disease.  Heart disease.  Stroke.  Heart attack.  Not being able to have children (infertility).  Weak bones (osteoporosis) and broken bones (fractures).  If you have coughing, wheezing, and shortness of breath, those symptoms may get better when you quit. You may also get sick less often. If you are pregnant, quitting smoking can help to lower your chances of having a baby of low birth weight. What can I do to help me quit smoking? Talk with your doctor about what can help you quit smoking. Some things you can do (strategies) include:  Quitting smoking totally, instead of slowly cutting back how much you smoke over a period of time.  Going to in-person counseling. You are more likely to quit if you go to many counseling sessions.  Using resources and support systems, such as: ? Online chats with a counselor. ? Phone quitlines. ? Printed self-help materials. ? Support groups or group counseling. ? Text messaging programs. ? Mobile phone apps or applications.  Taking medicines. Some of these medicines may have nicotine in them. If you are pregnant or breastfeeding, do not take any medicines to quit smoking unless your doctor says it is okay. Talk with your doctor about counseling or other things that can help you.  Talk with your doctor about using more than one strategy at the same time, such as taking medicines while you are also going to in-person counseling. This can help make  quitting easier. What things can I do to make it easier to quit? Quitting smoking might feel very hard at first, but there is a lot that you can do to make it easier. Take these steps:  Talk to your family and friends. Ask them to support and encourage you.  Call phone quitlines, reach out to support groups, or work with a counselor.  Ask people who smoke to not smoke around you.  Avoid places that make you want (trigger) to smoke, such as: ? Bars. ? Parties. ? Smoke-break areas at work.  Spend time with people who do not smoke.  Lower the stress in your life. Stress can make you want to smoke. Try these things to help your stress: ? Getting regular exercise. ? Deep-breathing exercises. ? Yoga. ? Meditating. ? Doing a body scan. To do this, close your eyes, focus on one area of your body at a time from head to toe, and notice which parts of your body are tense. Try to relax the muscles in those areas.  Download or buy apps on your mobile phone or tablet that can help you stick to your quit plan. There are many free apps, such as QuitGuide from the CDC (Centers for Disease Control and Prevention). You can find more support from smokefree.gov and other websites.  This information is not intended to replace advice given to you by your health care provider. Make sure you discuss any questions you have with your health care provider. Document Released: 07/10/2009 Document   Revised: 05/11/2016 Document Reviewed: 01/28/2015 Elsevier Interactive Patient Education  2018 [employer]  

## 2018-05-15 NOTE — Telephone Encounter (Signed)
Left message for patient re September appointments. No additional dates in care plan. Per 8/19 los lab/scan/fu 4 weeks. Central radiology will call re scan.

## 2018-05-15 NOTE — Patient Instructions (Signed)
[facility] Discharge Instructions for Patients Receiving Chemotherapy  Today you received the following chemotherapy agents:  Taxol, Carboplatin  To help prevent nausea and vomiting after your treatment, we encourage you to take your nausea medication as prescribed.   If you develop nausea and vomiting that is not controlled by your nausea medication, call the clinic.   BELOW ARE SYMPTOMS THAT SHOULD BE REPORTED IMMEDIATELY:  *FEVER GREATER THAN 100.5 F  *CHILLS WITH OR WITHOUT FEVER  NAUSEA AND VOMITING THAT IS NOT CONTROLLED WITH YOUR NAUSEA MEDICATION  *UNUSUAL SHORTNESS OF BREATH  *UNUSUAL BRUISING OR BLEEDING  TENDERNESS IN MOUTH AND THROAT WITH OR WITHOUT PRESENCE OF ULCERS  *URINARY PROBLEMS  *BOWEL PROBLEMS  UNUSUAL RASH Items with * indicate a potential emergency and should be followed up as soon as possible.  Feel free to call the clinic should you have any questions or concerns. The clinic phone number is [phone].  Please show the CHEMO ALERT CARD at check-in to the Emergency Department and triage nurse.   

## 2018-05-16 ENCOUNTER — Ambulatory Visit: Payer: Medicare Other

## 2018-05-16 ENCOUNTER — Ambulatory Visit
Admission: RE | Admit: 2018-05-16 | Discharge: 2018-05-16 | Disposition: A | Discharge status: Home / Self Care | Payer: Medicare Other | Source: Ambulatory Visit | Attending: Radiation Oncology | Admitting: Radiation Oncology

## 2018-05-16 DIAGNOSIS — C3491 Malignant neoplasm of unspecified part of right bronchus or lung: Secondary | ICD-10-CM | POA: Diagnosis not present

## 2018-05-16 DIAGNOSIS — C3411 Malignant neoplasm of upper lobe, right bronchus or lung: Secondary | ICD-10-CM | POA: Diagnosis not present

## 2018-05-17 ENCOUNTER — Ambulatory Visit
Admission: RE | Admit: 2018-05-17 | Discharge: 2018-05-17 | Disposition: A | Discharge status: Home / Self Care | Payer: Medicare Other | Source: Ambulatory Visit | Attending: Radiation Oncology | Admitting: Radiation Oncology

## 2018-05-17 ENCOUNTER — Ambulatory Visit: Payer: Medicare Other

## 2018-05-17 DIAGNOSIS — C3491 Malignant neoplasm of unspecified part of right bronchus or lung: Secondary | ICD-10-CM | POA: Diagnosis not present

## 2018-05-17 DIAGNOSIS — C3411 Malignant neoplasm of upper lobe, right bronchus or lung: Secondary | ICD-10-CM | POA: Diagnosis not present

## 2018-05-18 ENCOUNTER — Encounter: Payer: Self-pay | Admitting: Radiation Oncology

## 2018-05-18 ENCOUNTER — Ambulatory Visit
Admission: RE | Admit: 2018-05-18 | Discharge: 2018-05-18 | Disposition: A | Discharge status: Home / Self Care | Payer: Medicare Other | Source: Ambulatory Visit | Attending: Radiation Oncology | Admitting: Radiation Oncology

## 2018-05-18 DIAGNOSIS — C3411 Malignant neoplasm of upper lobe, right bronchus or lung: Secondary | ICD-10-CM | POA: Diagnosis not present

## 2018-05-18 DIAGNOSIS — C3491 Malignant neoplasm of unspecified part of right bronchus or lung: Secondary | ICD-10-CM | POA: Diagnosis not present

## 2018-06-06 ENCOUNTER — Encounter: Payer: Self-pay | Admitting: Cardiology

## 2018-06-06 ENCOUNTER — Ambulatory Visit: Payer: Medicare Other | Admitting: Cardiology

## 2018-06-06 VITALS — BP 110/58 | HR 83 | Ht 73.0 in | Wt 232.0 lb

## 2018-06-06 DIAGNOSIS — I2510 Atherosclerotic heart disease of native coronary artery without angina pectoris: Secondary | ICD-10-CM

## 2018-06-06 DIAGNOSIS — I255 Ischemic cardiomyopathy: Secondary | ICD-10-CM | POA: Diagnosis not present

## 2018-06-06 DIAGNOSIS — I739 Peripheral vascular disease, unspecified: Secondary | ICD-10-CM | POA: Diagnosis not present

## 2018-06-06 DIAGNOSIS — Z720 Tobacco use: Secondary | ICD-10-CM | POA: Diagnosis not present

## 2018-06-06 MED ORDER — FUROSEMIDE 20 MG PO TABS: 20.0000 mg | ORAL_TABLET | Freq: Every day | ORAL | 3 refills | Status: DC

## 2018-06-06 NOTE — Patient Instructions (Addendum)
Medication Instructions:  Your physician has recommended you make the following change in your medication:  1. START Lasix (Furosemide) 20 mg once daily  * If you need a refill on your cardiac medications before your next appointment, please call your pharmacy.   Labwork: None ordered  Testing/Procedures: None ordered  Follow-Up: Your physician recommends that you schedule a follow-up appointment in: November with Dr. Oval Linsey.  Remote monitoring is used to monitor your Pacemaker of ICD from home. This monitoring reduces the number of office visits required to check your device to one time per year. It allows [place] to keep an eye on the functioning of your device to ensure it is working properly. You are scheduled for a device check from home on 09/05/2018. You may send your transmission at any time that day. If you have a wireless device, the transmission will be sent automatically. After your physician reviews your transmission, you will receive a postcard with your next transmission date.  Your physician wants you to follow-up in: 9 months with Dr. Curt Bears.  You will receive a reminder letter in the mail two months in advance. If you don't receive a letter, please call our office to schedule the follow-up appointment.  *Please note that any paperwork needing to be filled out by the provider will need to be addressed at the front desk prior to seeing the provider. Please note that any FMLA, disability or other documents regarding health condition is subject to a $25.00 charge that must be received prior to completion of paperwork in the form of a money order or check.  Thank you for choosing [facility]!!   Trinidad Curet, RN [phone]  Any Other Special Instructions Will Be Listed Below (If Applicable).  Furosemide tablets What is this medicine? FUROSEMIDE (fyoor [place] se mide) is a diuretic. It helps you make more urine and to lose salt and excess water from your body. This  medicine is used to treat high blood pressure, and edema or swelling from heart, kidney, or liver disease. This medicine may be used for other purposes; ask your health care provider or pharmacist if you have questions. COMMON BRAND NAME(S): Active-Medicated Specimen Kit, Delone, Diuscreen, Lasix, RX Specimen Collection Kit, Specimen Collection Kit, URINX Medicated Specimen Collection What should I tell my health care provider before I take this medicine? They need to know if you have any of these conditions: -abnormal blood electrolytes -diarrhea or vomiting -gout -heart disease -kidney disease, small amounts of urine, or difficulty passing urine -liver disease -thyroid disease -an unusual or allergic reaction to furosemide, sulfa drugs, other medicines, foods, dyes, or preservatives -pregnant or trying to get pregnant -breast-feeding How should I use this medicine? Take this medicine by mouth with a glass of water. Follow the directions on the prescription label. You may take this medicine with or without food. If it upsets your stomach, take it with food or milk. Do not take your medicine more often than directed. Remember that you will need to pass more urine after taking this medicine. Do not take your medicine at a time of day that will cause you problems. Do not take at bedtime. Talk to your pediatrician regarding the use of this medicine in children. While this drug may be prescribed for selected conditions, precautions do apply. Overdosage: If you think you have taken too much of this medicine contact a poison control center or emergency room at once. NOTE: This medicine is only for you. Do not share this medicine  with others. What if I miss a dose? If you miss a dose, take it as soon as you can. If it is almost time for your next dose, take only that dose. Do not take double or extra doses. What may interact with this medicine? -aspirin and aspirin-like medicines -certain  antibiotics -chloral hydrate -cisplatin -cyclosporine -digoxin -diuretics -laxatives -lithium -medicines for blood pressure -medicines that relax muscles for surgery -methotrexate -NSAIDs, medicines for pain and inflammation like ibuprofen, naproxen, or indomethacin -phenytoin -steroid medicines like prednisone or cortisone -sucralfate -thyroid hormones This list may not describe all possible interactions. Give your health care provider a list of all the medicines, herbs, non-prescription drugs, or dietary supplements you use. Also tell them if you smoke, drink alcohol, or use illegal drugs. Some items may interact with your medicine. What should I watch for while using this medicine? Visit your doctor or health care professional for regular checks on your progress. Check your blood pressure regularly. Ask your doctor or health care professional what your blood pressure should be, and when you should contact him or her. If you are a diabetic, check your blood sugar as directed. You may need to be on a special diet while taking this medicine. Check with your doctor. Also, ask how many glasses of fluid you need to drink a day. You must not get dehydrated. You may get drowsy or dizzy. Do not drive, use machinery, or do anything that needs mental alertness until you know how this drug affects you. Do not stand or sit up quickly, especially if you are an older patient. This reduces the risk of dizzy or fainting spells. Alcohol can make you more drowsy and dizzy. Avoid alcoholic drinks. This medicine can make you more sensitive to the sun. Keep out of the sun. If you cannot avoid being in the sun, wear protective clothing and use sunscreen. Do not use sun lamps or tanning beds/booths. What side effects may I notice from receiving this medicine? Side effects that you should report to your doctor or health care professional as soon as possible: -blood in urine or stools -dry mouth -fever or  chills -hearing loss or ringing in the ears -irregular heartbeat -muscle pain or weakness, cramps -skin rash -stomach upset, pain, or nausea -tingling or numbness in the hands or feet -unusually weak or tired -vomiting or diarrhea -yellowing of the eyes or skin Side effects that usually do not require medical attention (report to your doctor or health care professional if they continue or are bothersome): -headache -loss of appetite -unusual bleeding or bruising This list may not describe all possible side effects. Call your doctor for medical advice about side effects. You may report side effects to FDA at 1-800-FDA-1088. Where should I keep my medicine? Keep out of the reach of children. Store at room temperature between 15 and 30 degrees C (59 and 86 degrees F). Protect from light. Throw away any unused medicine after the expiration date. NOTE: This sheet is a summary. It may not cover all possible information. If you have questions about this medicine, talk to your doctor, pharmacist, or health care provider.  2018 Elsevier/Gold Standard (2014-12-04 13:49:50)

## 2018-06-06 NOTE — Progress Notes (Signed)
Electrophysiology Office Note   Date:  06/06/2018   ID:  [name], [name] 03-08-1961, MRN [id number]  PCP:  Redmond School, MD  Cardiologist:  Oval Linsey Primary Electrophysiologist:  Sumayyah Custodio Meredith Leeds, MD    No chief complaint on file.    History of Present Illness: [name] is a 57 y.o. male who is being seen today for the evaluation of ischemic cardiomyopathy at the request of [name]. Presenting today for electrophysiology evaluation.  He has a history of coronary disease status post STEMI, chronic systolic and diastolic heart failure with an EF of 20 to 25%, peripheral arterial disease status post right femoral-tibial bypass followed by BKA, tobacco abuse, hepatitis C status post treatment.  He had an anterior STEMI on 05/26/2016 with an occluded LAD treated with drug-eluting stent.  Ejection fraction was 40% at that time with a troponin greater than 65.  Echo revealed an EF of 20 to 25%.  Coreg was increased.  Repeat echo showed an EF of 25 to 30%.  He does not have chest pain or shortness of breath.  He denies orthopnea and PND.  Both he and his wife are trying to quit smoking.Medtronic ICD implanted 03/02/18. Post op CXR showed RUL mass and was subsequently diagnosed with stage IIIa squamous cell lung cancer and has been undergoing chemotherapy and palliative radiation.  Today, denies symptoms of palpitations, chest pain, shortness of breath, orthopnea, PND, lower extremity edema, claudication, dizziness, presyncope, syncope, bleeding, or neurologic sequela. The patient is tolerating medications without difficulties.  Overall he is feeling well.  He is undergoing chemo and radiation for his non-small cell lung cancer.  He is tolerating this well.  He does have shortness of breath which is mild and occurs mainly when he exerts himself.   Past Medical History:  Diagnosis Date  . Acute hepatitis C virus infection 06/19/2007   Qualifier: Diagnosis of  By: Leilani Merl CMA, Tiffany      . Acute ST elevation myocardial infarction (STEMI) involving left anterior descending (LAD) coronary artery ([place]) 05/26/2016  . Acute ST elevation myocardial infarction (STEMI) of lateral wall ([place]) 05/26/2016  . 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 Anxiety   . Asthma   . Atherosclerosis of native arteries of the extremities with intermittent claudication 12/16/2011  . Chronic hepatitis C without hepatic coma ([place]) 05/03/2014  . COPD with chronic bronchitis and emphysema ([place]) 03/13/2018   FEV1 57%  . Depression   . DVT (deep venous thrombosis) ([place]) 2009; 2019   RLE; LLE  . Encounter for antineoplastic chemotherapy 03/22/2018  . Hepatitis C    "tx'd in 2015"  . High cholesterol   . Ischemic cardiomyopathy 03/02/2018  . Non-small cell carcinoma of right lung, stage 3 ([place]) 03/22/2018  . PAD (peripheral artery disease) ([place]) 01/25/2013  . Peripheral vascular disease, unspecified 07/20/2012  . Port-A-Cath in place 04/24/2018  . ST elevation myocardial infarction involving left anterior descending (LAD) coronary artery ([place])   . STEMI (ST elevation myocardial infarction) ([place]) 05/26/2016  . Tobacco abuse 01/28/2016  . Tubular adenoma of colon 07/11/2014  . Urinary dribbling    Past Surgical History:  Procedure Laterality Date  . AORTOGRAM  08/08/2009   for LLE claudication     By Dr. Oneida Alar  . 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 BIOPSY N/A 05/30/2014   Procedure: BIOPSY;  Surgeon: [name], MD;  Location: [facility];  Service: Endoscopy;  Laterality: N/A;  . BLADDER TUMOR EXCISION  8/812  . CARDIAC CATHETERIZATION N/A 05/26/2016   Procedure: Left Heart Cath and Coronary Angiography;  Surgeon: [name], MD;  Location: [facility];  Service: Cardiovascular;  Laterality: N/A;  . CARDIAC CATHETERIZATION N/A 05/26/2016   Procedure: Coronary Stent Intervention;  Surgeon: [name], MD;  Location: [facility];  Service: Cardiovascular;   Laterality: N/A;  . COLONOSCOPY WITH PROPOFOL N/A 05/30/2014   TKW:IOXBDZ colonic polyp-likely source of hematochezia-removed as described above  . ESOPHAGOGASTRODUODENOSCOPY (EGD) WITH PROPOFOL N/A 05/30/2014   HGD:JMEQAS and bulbar erosions s/p gastric biopsy. No evidence of portal gastropathy on today's examination.  . FEMORAL-TIBIAL BYPASS GRAFT  2009   Right side using non-reversed GSV   By Dr. Oneida Alar  . FEMORAL-TIBIAL BYPASS GRAFT  11/24/2007   Right femoral to anterior tibial BPG   by Dr. Oneida Alar  . FINGER SURGERY Left    "straightened my pinky"  . HAND TENDON SURGERY Left 2013   Left 5th finger  . HERNIA REPAIR     "stomach"  . ICD IMPLANT N/A 03/02/2018   Procedure: ICD IMPLANT;  Surgeon: [name], MD;  Location: [facility];  Service: Cardiovascular;  Laterality: N/A;  . INCISIONAL HERNIA REPAIR N/A 12/25/2014   Procedure: HERNIA REPAIR INCISIONAL WITH MESH;  Surgeon: [name], MD;  Location: [facility];  Service: General;  Laterality: N/A;  . INSERTION OF MESH N/A 12/25/2014   Procedure: INSERTION OF MESH;  Surgeon: [name], MD;  Location: [facility];  Service: General;  Laterality: N/A;  . IR IMAGING GUIDED PORT INSERTION  04/11/2018  . LAPAROSCOPIC CHOLECYSTECTOMY    . LOWER EXTREMITY ANGIOGRAM Left 12/30/2015   Procedure: Lower Extremity Angiogram;  Surgeon: [name], MD;  Location: [facility];  Service: Cardiovascular;  Laterality: Left;  . PERIPHERAL VASCULAR CATHETERIZATION N/A 12/30/2015   Procedure: Abdominal Aortogram;  Surgeon: [name], MD;  Location: [facility];  Service: Cardiovascular;  Laterality: N/A;  . POLYPECTOMY  05/30/2014   Procedure: POLYPECTOMY;  Surgeon: [name], MD;  Location: [facility];  Service: Endoscopy;;  . TONSILLECTOMY AND ADENOIDECTOMY  ~ 1970  . TYMPANOSTOMY TUBE PLACEMENT Bilateral ~ 1970     Current Outpatient Medications  Medication Sig Dispense Refill  . alprazolam (XANAX) 2 MG tablet  Take 2 mg by mouth 4 (four) times daily as needed for anxiety.     [name] amitriptyline (ELAVIL) 100 MG tablet Take 100 mg by mouth at bedtime.     [name] aspirin 81 MG EC tablet Take 81 mg by mouth daily.      [name] atorvastatin (LIPITOR) 80 MG tablet Take 1 tablet (80 mg total) by mouth daily at 6 PM. 30 tablet 12  . carvedilol (COREG) 6.25 MG tablet TAKE 1 TABLET BY MOUTH TWICE A DAY WITH A MEAL 60 tablet 6  . citalopram (CELEXA) 40 MG tablet Take 40 mg by mouth daily.  11  . HYDROcodone-acetaminophen (NORCO) 10-325 MG tablet Take 1 tablet by mouth every 4 (four) hours as needed for moderate pain.   0  . lidocaine-prilocaine (EMLA) cream Apply 1 application topically as needed. 30 g 0  . lisinopril (PRINIVIL,ZESTRIL) 2.5 MG tablet Take 1 tablet (2.5 mg total) by mouth daily. 30 tablet 12  . nitroGLYCERIN (NITROSTAT) 0.4 MG SL tablet Place 1 tablet (0.4 mg total) under the tongue every 5 (five) minutes x 3 doses as needed for chest pain. 25 tablet 2  .  prochlorperazine (COMPAZINE) 10 MG tablet Take 1 tablet (10 mg total) by mouth every 6 (six) hours as needed for nausea or vomiting. 30 tablet 0  . spironolactone (ALDACTONE) 25 MG tablet TAKE 1 TABLET BY MOUTH EVERY DAY 90 tablet 3  . sucralfate (CARAFATE) 1 g tablet Take 1 tablet (1 g total) by mouth 4 (four) times daily -  with meals and at bedtime. 5 min before meals for radiation induced esophagitis 120 tablet 2  . tamsulosin (FLOMAX) 0.4 MG CAPS capsule Take 1 capsule by mouth daily.    [name] umeclidinium-vilanterol (ANORO ELLIPTA) 62.5-25 MCG/INH AEPB Inhale 1 puff into the lungs daily. 1 each 0   No current facility-administered medications for this visit.     Allergies:   Lyrica [pregabalin] and Neurontin [gabapentin]   Social History:  The patient  reports that he has been smoking cigarettes. He has a 62.00 pack-year smoking history. He has never used smokeless tobacco. He reports that he drinks about 18.0 standard drinks of alcohol per week. He reports  that he has current or past drug history. Drug: Marijuana.   Family History:  The patient's family history includes Ulcers in his father; Vision loss in his mother.    ROS:  Please see the history of present illness.   Otherwise, review of systems is positive for none.   All other systems are reviewed and negative.   PHYSICAL EXAM: VS:  BP (!) 110/58   Pulse 83   Ht 6\' 1"  (1.854 m)   Wt 232 lb (105.2 kg)   BMI 30.61 kg/m  , BMI Body mass index is 30.61 kg/m.  Sweats, fatigue, leg swelling, shortness of breath, chest pressure, cough, snoring, wheezing, abdominal pain, constipation, nausea, depression, anxiety, difficulty urinating, back pain, balance problems, rash, dizziness, easy bruising GEN: Well nourished, well developed, in no acute distress  HEENT: normal  Neck: no JVD, carotid bruits, or masses Cardiac: RRR; no murmurs, rubs, or gallops,no edema  Respiratory:  clear to auscultation bilaterally, normal work of breathing GI: soft, nontender, nondistended, + BS MS: no deformity or atrophy  Skin: warm and dry, device site well healed Neuro:  Strength and sensation are intact Psych: euthymic mood, full affect  EKG:  EKG is ordered today. Personal review of the ekg ordered shows sinus rhythm, low voltage, anterior infarct  Personal review of the device interrogation today. Results in [facility]: 05/15/2018: ALT 31; BUN 6; Creatinine 0.77; Hemoglobin 11.4; Platelet Count 156; Potassium 4.4; Sodium 138    Lipid Panel     Component Value Date/Time   CHOL 93 (L) 01/31/2018 1124   TRIG 81 01/31/2018 1124   HDL 39 (L) 01/31/2018 1124   CHOLHDL 2.4 01/31/2018 1124   CHOLHDL 3.5 05/26/2016 0459   VLDL 35 05/26/2016 0459   LDLCALC 38 01/31/2018 1124     Wt Readings from Last 3 Encounters:  06/06/18 232 lb (105.2 kg)  05/15/18 228 lb 6.4 oz (103.6 kg)  05/08/18 231 lb 5 oz (104.9 kg)      Other studies Reviewed: Additional studies/ records that were reviewed  today include: TTE 02/07/18 Review of the above records today demonstrates:  - Left ventricle: Poorly visualized. The cavity size was mildly   dilated. Systolic function was severely reduced. The estimated   ejection fraction was in the range of 20% to 25%. Severe diffuse   hypokinesis with distinct regional wall motion abnormalities.   There is akinesis of the apicalanterior, lateral, and apical  myocardium. Features are consistent with a pseudonormal left   ventricular filling pattern, with concomitant abnormal relaxation   and increased filling pressure (grade 2 diastolic dysfunction). - Left atrium: The atrium was mildly dilated.  LHC 05/26/16:  A STENT XIENCE ALPINE RX 3.5X18 drug eluting stent was successfully placed, and does not overlap previously placed stent.  Prox LAD lesion, 100 %stenosed.  Post intervention, there is a 0% residual stenosis.  LV end diastolic pressure is mildly elevated.  There is mild to moderate left ventricular systolic dysfunction.  ASSESSMENT AND PLAN:  1.  Ischemic cardiomyopathy: on optimal medical therapy. S/p Medtronic ICD implanted 03/02/18.  This is functioning appropriately.  His OptiVol is quite elevated and is rising.  We Madie Cahn give him 20 mg of Lasix to take daily.  2.  Coronary artery disease status post anterior wall STEMI: Status post LAD drug-eluting stent.  On aspirin and and Plavix.  No changes.  3.  Peripheral arterial disease: Currently on aspirin and statin.  Smoking cessation encouraged.  Has follow-up with vascular surgery.  4.  Tobacco abuse: Smoking cessation encouraged  5. Stage IIIa non small cell lungs cancer: Plan per oncology.  He is undergoing chemo and radiation  Current medicines are reviewed at length with the patient today.   The patient does not have concerns regarding his medicines.  The following changes were made today: Start Lasix  Labs/ tests ordered today include:  Orders Placed This Encounter  Procedures    . EKG 12-Lead    Disposition:   FU with Makaelyn Aponte 9 months  Signed, Cannon Quinton Meredith Leeds, MD  06/06/2018 12:00 PM     [address] [place] [address] [phone] (office) [phone] (fax)

## 2018-06-09 ENCOUNTER — Inpatient Hospital Stay: Payer: Medicare Other

## 2018-06-09 ENCOUNTER — Encounter (HOSPITAL_COMMUNITY): Payer: Self-pay

## 2018-06-09 ENCOUNTER — Ambulatory Visit (HOSPITAL_COMMUNITY)
Admission: RE | Admit: 2018-06-09 | Discharge: 2018-06-09 | Disposition: A | Discharge status: Home / Self Care | Payer: Medicare Other | Source: Ambulatory Visit | Attending: Internal Medicine | Admitting: Internal Medicine

## 2018-06-09 ENCOUNTER — Inpatient Hospital Stay: Payer: Medicare Other | Attending: Internal Medicine

## 2018-06-09 DIAGNOSIS — I700 Atherosclerosis of aorta: Secondary | ICD-10-CM | POA: Insufficient documentation

## 2018-06-09 DIAGNOSIS — Z5112 Encounter for antineoplastic immunotherapy: Secondary | ICD-10-CM | POA: Insufficient documentation

## 2018-06-09 DIAGNOSIS — R590 Localized enlarged lymph nodes: Secondary | ICD-10-CM | POA: Diagnosis not present

## 2018-06-09 DIAGNOSIS — Z95828 Presence of other vascular implants and grafts: Secondary | ICD-10-CM

## 2018-06-09 DIAGNOSIS — C3490 Malignant neoplasm of unspecified part of unspecified bronchus or lung: Secondary | ICD-10-CM

## 2018-06-09 DIAGNOSIS — J432 Centrilobular emphysema: Secondary | ICD-10-CM | POA: Insufficient documentation

## 2018-06-09 DIAGNOSIS — C3411 Malignant neoplasm of upper lobe, right bronchus or lung: Secondary | ICD-10-CM | POA: Diagnosis not present

## 2018-06-09 DIAGNOSIS — C3491 Malignant neoplasm of unspecified part of right bronchus or lung: Secondary | ICD-10-CM

## 2018-06-09 DIAGNOSIS — J438 Other emphysema: Secondary | ICD-10-CM | POA: Insufficient documentation

## 2018-06-09 DIAGNOSIS — Z79899 Other long term (current) drug therapy: Secondary | ICD-10-CM | POA: Insufficient documentation

## 2018-06-09 LAB — CMP (CANCER CENTER ONLY)
ALBUMIN: 3.9 g/dL (ref 3.5–5.0)
ALK PHOS: 142 U/L — AB (ref 38–126)
ALT: 29 U/L (ref 0–44)
ANION GAP: 7 (ref 5–15)
AST: 26 U/L (ref 15–41)
BILIRUBIN TOTAL: 0.5 mg/dL (ref 0.3–1.2)
BUN: 9 mg/dL (ref 6–20)
CALCIUM: 9.6 mg/dL (ref 8.9–10.3)
CO2: 31 mmol/L (ref 22–32)
Chloride: 100 mmol/L (ref 98–111)
Creatinine: 1.01 mg/dL (ref 0.61–1.24)
GFR, Est AFR Am: >60 mL/min (ref >60)
GFR, Estimated: >60 mL/min (ref >60)
GLUCOSE: 114 mg/dL — AB (ref 70–99)
Potassium: 4.3 mmol/L (ref 3.5–5.1)
Sodium: 138 mmol/L (ref 135–145)
TOTAL PROTEIN: 7.2 g/dL (ref 6.5–8.1)

## 2018-06-09 LAB — CBC WITH DIFFERENTIAL (CANCER CENTER ONLY)
BASOS ABS: 0 10*3/uL (ref 0.0–0.1)
BASOS PCT: 0 %
EOS ABS: 0.1 10*3/uL (ref 0.0–0.5)
EOS PCT: 1 %
HCT: 37.3 % — ABNORMAL LOW (ref 38.4–49.9)
Hemoglobin: 12.4 g/dL — ABNORMAL LOW (ref 13.0–17.1)
Lymphocytes Relative: 8 %
Lymphs Abs: 0.5 10*3/uL — ABNORMAL LOW (ref 0.9–3.3)
MCH: 32.3 pg (ref 27.2–33.4)
MCHC: 33.2 g/dL (ref 32.0–36.0)
MCV: 97.1 fL (ref 79.3–98.0)
MONO ABS: 0.6 10*3/uL (ref 0.1–0.9)
Monocytes Relative: 10 %
Neutro Abs: 5.2 10*3/uL (ref 1.5–6.5)
Neutrophils Relative %: 81 %
PLATELETS: 177 10*3/uL (ref 140–400)
RBC: 3.84 MIL/uL — ABNORMAL LOW (ref 4.20–5.82)
RDW: 19 % — AB (ref 11.0–14.6)
WBC Count: 6.5 10*3/uL (ref 4.0–10.3)

## 2018-06-09 MED ORDER — IOHEXOL 300 MG/ML  SOLN
75.0000 mL | Freq: Once | INTRAMUSCULAR | Status: AC | PRN
  Administered 2018-06-09: 75 mL via INTRAVENOUS

## 2018-06-09 MED ORDER — HEPARIN SOD (PORK) LOCK FLUSH 100 UNIT/ML IV SOLN
500.0000 [IU] | Freq: Once | INTRAVENOUS | Status: AC
  Administered 2018-06-09: 500 [IU] via INTRAVENOUS

## 2018-06-09 MED ORDER — SODIUM CHLORIDE 0.9% FLUSH
10.0000 mL | Freq: Once | INTRAVENOUS | Status: AC
  Administered 2018-06-09: 10 mL
  Filled 2018-06-09: qty 10

## 2018-06-09 MED ORDER — HEPARIN SOD (PORK) LOCK FLUSH 100 UNIT/ML IV SOLN
INTRAVENOUS | Status: AC
  Filled 2018-06-09: qty 5

## 2018-06-12 ENCOUNTER — Encounter: Payer: Self-pay | Admitting: Internal Medicine

## 2018-06-12 ENCOUNTER — Telehealth: Payer: Self-pay | Admitting: Internal Medicine

## 2018-06-12 ENCOUNTER — Inpatient Hospital Stay (HOSPITAL_BASED_OUTPATIENT_CLINIC_OR_DEPARTMENT_OTHER): Payer: Medicare Other | Admitting: Internal Medicine

## 2018-06-12 VITALS — BP 117/82 | HR 84 | Temp 98.2°F | Resp 18 | Ht 73.0 in | Wt 229.0 lb

## 2018-06-12 DIAGNOSIS — Z7189 Other specified counseling: Secondary | ICD-10-CM

## 2018-06-12 DIAGNOSIS — C3411 Malignant neoplasm of upper lobe, right bronchus or lung: Secondary | ICD-10-CM

## 2018-06-12 DIAGNOSIS — Z5112 Encounter for antineoplastic immunotherapy: Secondary | ICD-10-CM | POA: Insufficient documentation

## 2018-06-12 DIAGNOSIS — C3491 Malignant neoplasm of unspecified part of right bronchus or lung: Secondary | ICD-10-CM

## 2018-06-12 DIAGNOSIS — Z79899 Other long term (current) drug therapy: Secondary | ICD-10-CM | POA: Diagnosis not present

## 2018-06-12 NOTE — Telephone Encounter (Signed)
Appts scheduled AVS/calendar printed per 9/16 los

## 2018-06-12 NOTE — Progress Notes (Signed)
[name] Telephone:(336) [phone]   Fax:(336) [phone]  OFFICE PROGRESS NOTE  [place], [place] [address]  DIAGNOSIS:stage IIIA (T3, N2, M0) non-small cell lung cancer, adenosquamous carcinoma diagnosed in June 2019 and presented with large right upper lobe lung mass with questionable chest wall invasion as well as right hilar and mediastinal lymphadenopathy.  Biomarker Findings Tumor Mutational Burden - TMB-High (21 Muts/Mb) Microsatellite status - MS-Stable Genomic Findings For a complete list of the genes assayed, please refer to the Appendix. ATM V4008* CCND2 P281R KRAS G12V CHEK2 T366f*15 TP53 E298* 7 Disease relevant genes with no reportable alterations: ALK, EGFR, BRAF, MET, RET, ERBB2, ROS1  PRIOR THERAPY:Concurrent chemoradiation with chemotherapy consisting of weekly carboplatin for an AUC of 2 and paclitaxel 45 mg/m2. First dose given on 04/03/2018.  Status post 6 cycles.  Last dose was giving 05/15/2018 with partial response.  CURRENT THERAPY:Consolidation treatment with immunotherapy with Imfinzi (Durvalumab) 10 mg/KG every 2 weeks.  First dose 06/20/2018  INTERVAL HISTORY: [name] 57y.o. male returns to the clinic today for follow-up visit accompanied by his wife.  The patient is feeling fine today with no specific complaints except for intermittent dizzy spells and fall.  He mentioned that he has these episodes since he was young.  He denied having any current chest pain, shortness of breath, cough or hemoptysis.  He denied having any fever or chills.  He has no nausea, vomiting, diarrhea or constipation.  He has no headache or visual changes.  The patient tolerated the previous course of concurrent chemoradiation fairly well.  He is here today for evaluation after repeating CT scan of the chest for restaging of his disease.  MEDICAL HISTORY: Past Medical History:  Diagnosis Date  . Acute hepatitis C  virus infection 06/19/2007   Qualifier: Diagnosis of  By: MLeilani MerlCMA, Tiffany    . Acute ST elevation myocardial infarction (STEMI) involving left anterior descending (LAD) coronary artery ([facility] 05/26/2016  . Acute ST elevation myocardial infarction (STEMI) of lateral wall ([facility] 05/26/2016  . 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 Anxiety   . Asthma   . Atherosclerosis of native arteries of the extremities with intermittent claudication 12/16/2011  . Chronic hepatitis C without hepatic coma ([facility] 05/03/2014  . COPD with chronic bronchitis and emphysema ([facility] 03/13/2018   FEV1 57%  . Depression   . DVT (deep venous thrombosis) ([facility] 2009; 2019   RLE; LLE  . Encounter for antineoplastic chemotherapy 03/22/2018  . Hepatitis C    "tx'd in 2015"  . High cholesterol   . Ischemic cardiomyopathy 03/02/2018  . Non-small cell carcinoma of right lung, stage 3 ([facility] 03/22/2018  . PAD (peripheral artery disease) ([facility] 01/25/2013  . Peripheral vascular disease, unspecified 07/20/2012  . Port-A-Cath in place 04/24/2018  . ST elevation myocardial infarction involving left anterior descending (LAD) coronary artery ([facility]   . STEMI (ST elevation myocardial infarction) ([facility] 05/26/2016  . Tobacco abuse 01/28/2016  . Tubular adenoma of colon 07/11/2014  . Urinary dribbling     ALLERGIES:  is allergic to lyrica [pregabalin] and neurontin [gabapentin].  MEDICATIONS:  Current Outpatient Medications  Medication Sig Dispense Refill  . alprazolam (XANAX) 2 MG tablet Take 2 mg by mouth 4 (four) times daily as needed for anxiety.     .Marland Kitchenamitriptyline (ELAVIL) 100 MG tablet Take 100 mg by mouth at bedtime.     .Marland Kitchenaspirin 81 MG EC tablet Take 81  mg by mouth daily.      [name] atorvastatin (LIPITOR) 80 MG tablet Take 1 tablet (80 mg total) by mouth daily at 6 PM. 30 tablet 12  . carvedilol (COREG) 6.25 MG tablet TAKE 1 TABLET BY MOUTH TWICE A DAY WITH A MEAL 60 tablet 6  . citalopram (CELEXA) 40 MG tablet  Take 40 mg by mouth daily.  11  . furosemide (LASIX) 20 MG tablet Take 1 tablet (20 mg total) by mouth daily. 90 tablet 3  . HYDROcodone-acetaminophen (NORCO) 10-325 MG tablet Take 1 tablet by mouth every 4 (four) hours as needed for moderate pain.   0  . lidocaine-prilocaine (EMLA) cream Apply 1 application topically as needed. 30 g 0  . lisinopril (PRINIVIL,ZESTRIL) 2.5 MG tablet Take 1 tablet (2.5 mg total) by mouth daily. 30 tablet 12  . nitroGLYCERIN (NITROSTAT) 0.4 MG SL tablet Place 1 tablet (0.4 mg total) under the tongue every 5 (five) minutes x 3 doses as needed for chest pain. 25 tablet 2  . prochlorperazine (COMPAZINE) 10 MG tablet Take 1 tablet (10 mg total) by mouth every 6 (six) hours as needed for nausea or vomiting. 30 tablet 0  . spironolactone (ALDACTONE) 25 MG tablet TAKE 1 TABLET BY MOUTH EVERY DAY 90 tablet 3  . sucralfate (CARAFATE) 1 g tablet Take 1 tablet (1 g total) by mouth 4 (four) times daily -  with meals and at bedtime. 5 min before meals for radiation induced esophagitis 120 tablet 2  . tamsulosin (FLOMAX) 0.4 MG CAPS capsule Take 1 capsule by mouth daily.    [name] umeclidinium-vilanterol (ANORO ELLIPTA) 62.5-25 MCG/INH AEPB Inhale 1 puff into the lungs daily. 1 each 0   No current facility-administered medications for this visit.     SURGICAL HISTORY:  Past Surgical History:  Procedure Laterality Date  . AORTOGRAM  08/08/2009   for LLE claudication     By Dr. Oneida Alar  . 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 BIOPSY N/A 05/30/2014   Procedure: BIOPSY;  Surgeon: [name], MD;  Location: [facility];  Service: Endoscopy;  Laterality: N/A;  . BLADDER TUMOR EXCISION  8/812  . CARDIAC CATHETERIZATION N/A 05/26/2016   Procedure: Left Heart Cath and Coronary Angiography;  Surgeon: [name], MD;  Location: [facility];  Service: Cardiovascular;  Laterality: N/A;  . CARDIAC CATHETERIZATION N/A 05/26/2016   Procedure: Coronary Stent Intervention;  Surgeon:  [name], MD;  Location: [facility];  Service: Cardiovascular;  Laterality: N/A;  . COLONOSCOPY WITH PROPOFOL N/A 05/30/2014   CWC:BJSEGB colonic polyp-likely source of hematochezia-removed as described above  . ESOPHAGOGASTRODUODENOSCOPY (EGD) WITH PROPOFOL N/A 05/30/2014   TDV:VOHYWV and bulbar erosions s/p gastric biopsy. No evidence of portal gastropathy on today's examination.  . FEMORAL-TIBIAL BYPASS GRAFT  2009   Right side using non-reversed GSV   By Dr. Oneida Alar  . FEMORAL-TIBIAL BYPASS GRAFT  11/24/2007   Right femoral to anterior tibial BPG   by Dr. Oneida Alar  . FINGER SURGERY Left    "straightened my pinky"  . HAND TENDON SURGERY Left 2013   Left 5th finger  . HERNIA REPAIR     "stomach"  . ICD IMPLANT N/A 03/02/2018   Procedure: ICD IMPLANT;  Surgeon: [name], MD;  Location: [facility];  Service: Cardiovascular;  Laterality: N/A;  . INCISIONAL HERNIA REPAIR N/A 12/25/2014   Procedure: HERNIA REPAIR INCISIONAL WITH MESH;  Surgeon: [name], MD;  Location: [facility];  Service: General;  Laterality: N/A;  . INSERTION OF MESH N/A 12/25/2014   Procedure: INSERTION OF MESH;  Surgeon: [name], MD;  Location: [facility];  Service: General;  Laterality: N/A;  . IR IMAGING GUIDED PORT INSERTION  04/11/2018  . LAPAROSCOPIC CHOLECYSTECTOMY    . LOWER EXTREMITY ANGIOGRAM Left 12/30/2015   Procedure: Lower Extremity Angiogram;  Surgeon: [name], MD;  Location: [facility];  Service: Cardiovascular;  Laterality: Left;  . PERIPHERAL VASCULAR CATHETERIZATION N/A 12/30/2015   Procedure: Abdominal Aortogram;  Surgeon: [name], MD;  Location: [facility];  Service: Cardiovascular;  Laterality: N/A;  . POLYPECTOMY  05/30/2014   Procedure: POLYPECTOMY;  Surgeon: [name], MD;  Location: [facility];  Service: Endoscopy;;  . TONSILLECTOMY AND ADENOIDECTOMY  ~ 1970  . TYMPANOSTOMY TUBE PLACEMENT Bilateral ~ 1970    REVIEW OF SYSTEMS:   Constitutional: positive for fatigue Eyes: negative Ears, nose, mouth, throat, and face: negative Respiratory: negative Cardiovascular: negative Gastrointestinal: negative Genitourinary:negative Integument/breast: negative Hematologic/lymphatic: negative Musculoskeletal:negative Neurological: negative Behavioral/Psych: negative Endocrine: negative Allergic/Immunologic: negative   PHYSICAL EXAMINATION: General appearance: alert, cooperative and no distress Head: Normocephalic, without obvious abnormality, atraumatic Neck: no adenopathy, no JVD, supple, symmetrical, trachea midline and thyroid not enlarged, symmetric, no tenderness/mass/nodules Lymph nodes: Cervical, supraclavicular, and axillary nodes normal. Resp: clear to auscultation bilaterally Back: symmetric, no curvature. ROM normal. No CVA tenderness. Cardio: regular rate and rhythm, S1, S2 normal, no murmur, click, rub or gallop GI: soft, non-tender; bowel sounds normal; no masses,  no organomegaly Extremities: Right below knee amputation otherwise no edema. Neurologic: Alert and oriented X 3, normal strength and tone. Normal symmetric reflexes. Normal coordination and gait  ECOG PERFORMANCE STATUS: 1 - Symptomatic but completely ambulatory  Blood pressure 117/82, pulse 84, temperature 98.2 F (36.8 C), temperature source Oral, resp. rate 18, height '6\' 1"'$  (1.854 m), weight 229 lb (103.9 kg), SpO2 100 %.  LABORATORY DATA: Lab Results  Component Value Date   WBC 6.5 06/09/2018   HGB 12.4 (L) 06/09/2018   HCT 37.3 (L) 06/09/2018   MCV 97.1 06/09/2018   PLT 177 06/09/2018      Chemistry      Component Value Date/Time   NA 138 06/09/2018 1141   NA 139 02/27/2018 1457   K 4.3 06/09/2018 1141   CL 100 06/09/2018 1141   CO2 31 06/09/2018 1141   BUN 9 06/09/2018 1141   BUN 7 02/27/2018 1457   CREATININE 1.01 06/09/2018 1141   CREATININE 1.13 06/28/2016 1453      Component Value Date/Time   CALCIUM 9.6 06/09/2018  1141   ALKPHOS 142 (H) 06/09/2018 1141   AST 26 06/09/2018 1141   ALT 29 06/09/2018 1141   BILITOT 0.5 06/09/2018 1141       RADIOGRAPHIC STUDIES: Ct Chest W Contrast  Result Date: 06/09/2018 CLINICAL DATA:  Stage IIIA adenosquamous right upper lobe lung carcinoma diagnosed June 2019 status post concurrent chemoradiation therapy. Restaging. EXAM: CT CHEST WITH CONTRAST TECHNIQUE: Multidetector CT imaging of the chest was performed during intravenous contrast administration. CONTRAST:  31m OMNIPAQUE IOHEXOL 300 MG/ML  SOLN COMPARISON:  03/10/2018 PET-CT.  03/03/2018 chest CT. FINDINGS: Cardiovascular: Normal heart size. No significant pericardial effusion/thickening. Left anterior descending and left circumflex coronary atherosclerosis. Single lead left subclavian ICD terminates in the right ventricular apex. Right internal jugular MediPort terminates at the cavoatrial junction. Great vessels are normal in course and caliber. No central  pulmonary emboli. Mediastinum/Nodes: No discrete thyroid nodules. Unremarkable esophagus. No axillary adenopathy. No pathologically enlarged mediastinal nodes. Right hilar adenopathy has decreased to 0.7 cm from 1.2 cm (series 2/image 55). No pathologically enlarged hilar nodes. Lungs/Pleura: No pneumothorax. No pleural effusion. Mild centrilobular and paraseptal emphysema with diffuse bronchial wall thickening. Spiculated right upper lobe 6.0 x 4.2 cm lung mass (series 5/image 34), decreased from 7.9 x 7.1 cm. No acute consolidative airspace disease. Left major fissure 10 x 4 mm plaque (series 5/image 70) is stable. Stable calcified posterior left lower lobe subcentimeter granuloma. Two stable scattered left upper lobe pulmonary nodules, largest 5 mm anteriorly (series 5/image 71). No new significant pulmonary nodules. Upper abdomen: Cholecystectomy. Musculoskeletal: No aggressive appearing focal osseous lesions. Marked thoracic spondylosis. IMPRESSION: 1. Spiculated  right upper lobe lung mass is decreased in size. 2. Right hilar adenopathy is decreased. 3. No new or progressive metastatic disease in the chest. Aortic Atherosclerosis (ICD10-I70.0) and Emphysema (ICD10-J43.9). Electronically Signed   By: Ilona Sorrel M.D.   On: 06/09/2018 15:32    ASSESSMENT AND PLAN: This is a very pleasant 57 years old white male recently diagnosed with a stage IIIa non-small cell lung cancer, adenosquamous carcinoma.  He underwent a course of concurrent chemoradiation with weekly carboplatin and paclitaxel status post 6 cycles. The patient tolerated this course of concurrent chemoradiation fairly well except for mild odynophagia and dysphagia.  He is feeling much better today.  He had repeat CT scan of the chest performed recently. I personally and independently reviewed the scan images and discussed the result and showed the images to the patient and his wife. His a scan showed improvement of his disease with partial response. I discussed with the patient other treatment options including continuous observation and monitoring versus proceeding with consolidation treatment with immunotherapy with Imfinzi (Durvalumab) 10 mg/KG every 2 weeks for a total of 1 year if the patient has no evidence for disease progression or unacceptable toxicity. The patient would like to proceed with the treatment with immunotherapy. I discussed with him the adverse effect of this treatment including but not limited to immunotherapy mediated skin rash, diarrhea, inflammation of the lung, kidney, liver, thyroid or other endocrine dysfunction. He is expected to start the first cycle of this treatment next week. The patient will come back for follow-up visit in 3 weeks for evaluation with the start of cycle #2. He was advised to call immediately if he has any concerning symptoms in the interval. The patient voices understanding of current disease status and treatment options and is in agreement with the  current care plan.  All questions were answered. The patient knows to call the clinic with any problems, questions or concerns. We can certainly see the patient much sooner if necessary.  I spent 15 minutes counseling the patient face to face. The total time spent in the appointment was 25 minutes.  Disclaimer: This note was dictated with voice recognition software. Similar sounding words can inadvertently be transcribed and may not be corrected upon review.

## 2018-06-12 NOTE — Progress Notes (Signed)
DISCONTINUE ON PATHWAY REGIMEN - Non-Small Cell Lung     Administer weekly:     Paclitaxel      Carboplatin   **Always confirm dose/schedule in your pharmacy ordering system**  REASON: Continuation Of Treatment PRIOR TREATMENT: [id number]: Carboplatin AUC=2 + Paclitaxel 45 mg/m2 Weekly During Radiation TREATMENT RESPONSE: Partial Response (PR)  START ON PATHWAY REGIMEN - Non-Small Cell Lung     A cycle is every 14 days:     Durvalumab   **Always confirm dose/schedule in your pharmacy ordering system**  Patient Characteristics: Stage III - Unresectable, PS = 0, 1 AJCC T Category: T3 Current Disease Status: No Distant Mets or Local Recurrence AJCC N Category: N2 AJCC M Category: M0 AJCC 8 Stage Grouping: IIIB Performance Status: PS = 0, 1 Intent of Therapy: Curative Intent, Discussed with Patient

## 2018-06-13 NOTE — Progress Notes (Signed)
  Radiation Oncology         [phone] ________________________________  Name: [name] MRN: [id number]  Date: 05/18/2018  DOB: 22-Jan-1961  End of Treatment Note  Diagnosis:  57 y.o. male with Stage IIIa, cT3N2M0, NSCLC, adenosquamous carcinoma of the RUL  Indication for treatment:  curative       Radiation treatment dates:   04/04/2018 - 05/18/2018  Site/dose:   The patient was treated to the disease within the RUL lung initially to a dose of 60 Gy using a 1 field, 3-D conformal technique. The patient then received a cone down boost treatment for an additional 6 Gy. This yielded a final total dose of 66 Gy.   Narrative: The patient tolerated radiation treatment relatively well.   The patient did experience mild-moderate esophagitis during the course of treatment which required management with Carafate. He also noted increased fatigue and occasional shortness of breath.  Plan: The patient has completed radiation treatment. The patient will return to radiation oncology clinic for routine followup in one month. I advised the patient to call or return sooner if they have any questions or concerns related to their recovery or treatment. ________________________________  Jodelle Gross, MD, PhD  This document serves as a record of services personally performed by Kyung Rudd, MD. It was created on his behalf by Rae Lips, a trained medical scribe. The creation of this record is based on the scribe's personal observations and the provider's statements to them. This document has been checked and approved by the attending provider.

## 2018-06-20 ENCOUNTER — Inpatient Hospital Stay: Payer: Medicare Other

## 2018-06-20 VITALS — BP 100/75 | HR 75 | Temp 98.2°F | Resp 18

## 2018-06-20 DIAGNOSIS — Z79899 Other long term (current) drug therapy: Secondary | ICD-10-CM | POA: Diagnosis not present

## 2018-06-20 DIAGNOSIS — Z5112 Encounter for antineoplastic immunotherapy: Secondary | ICD-10-CM | POA: Diagnosis not present

## 2018-06-20 DIAGNOSIS — C3491 Malignant neoplasm of unspecified part of right bronchus or lung: Secondary | ICD-10-CM

## 2018-06-20 DIAGNOSIS — Z95828 Presence of other vascular implants and grafts: Secondary | ICD-10-CM

## 2018-06-20 DIAGNOSIS — C3411 Malignant neoplasm of upper lobe, right bronchus or lung: Secondary | ICD-10-CM | POA: Diagnosis not present

## 2018-06-20 LAB — CBC WITH DIFFERENTIAL (CANCER CENTER ONLY)
BASOS ABS: 0 10*3/uL (ref 0.0–0.1)
BASOS PCT: 0 %
EOS ABS: 0.2 10*3/uL (ref 0.0–0.5)
Eosinophils Relative: 3 %
HEMATOCRIT: 37.9 % — AB (ref 38.4–49.9)
HEMOGLOBIN: 12.6 g/dL — AB (ref 13.0–17.1)
Lymphocytes Relative: 11 %
Lymphs Abs: 0.8 10*3/uL — ABNORMAL LOW (ref 0.9–3.3)
MCH: 32.6 pg (ref 27.2–33.4)
MCHC: 33.2 g/dL (ref 32.0–36.0)
MCV: 98.2 fL — ABNORMAL HIGH (ref 79.3–98.0)
MONOS PCT: 10 %
Monocytes Absolute: 0.7 10*3/uL (ref 0.1–0.9)
NEUTROS ABS: 5.2 10*3/uL (ref 1.5–6.5)
NEUTROS PCT: 76 %
Platelet Count: 144 10*3/uL (ref 140–400)
RBC: 3.86 MIL/uL — AB (ref 4.20–5.82)
RDW: 17.8 % — ABNORMAL HIGH (ref 11.0–14.6)
WBC: 6.9 10*3/uL (ref 4.0–10.3)

## 2018-06-20 LAB — CMP (CANCER CENTER ONLY)
ALT: 24 U/L (ref 0–44)
AST: 17 U/L (ref 15–41)
Albumin: 3.9 g/dL (ref 3.5–5.0)
Alkaline Phosphatase: 136 U/L — ABNORMAL HIGH (ref 38–126)
Anion gap: 7 (ref 5–15)
BUN: 8 mg/dL (ref 6–20)
CHLORIDE: 99 mmol/L (ref 98–111)
CO2: 30 mmol/L (ref 22–32)
CREATININE: 0.91 mg/dL (ref 0.61–1.24)
Calcium: 9.2 mg/dL (ref 8.9–10.3)
Glucose, Bld: 86 mg/dL (ref 70–99)
POTASSIUM: 4.4 mmol/L (ref 3.5–5.1)
Sodium: 136 mmol/L (ref 135–145)
Total Bilirubin: 0.4 mg/dL (ref 0.3–1.2)
Total Protein: 7.1 g/dL (ref 6.5–8.1)

## 2018-06-20 LAB — TSH: TSH: 0.714 u[IU]/mL (ref 0.320–4.118)

## 2018-06-20 MED ORDER — SODIUM CHLORIDE 0.9 % IV SOLN
Freq: Once | INTRAVENOUS | Status: AC
  Administered 2018-06-20: 15:00:00 via INTRAVENOUS
  Filled 2018-06-20: qty 250

## 2018-06-20 MED ORDER — SODIUM CHLORIDE 0.9% FLUSH
10.0000 mL | INTRAVENOUS | Status: DC | PRN
  Administered 2018-06-20: 10 mL
  Filled 2018-06-20: qty 10

## 2018-06-20 MED ORDER — SODIUM CHLORIDE 0.9% FLUSH
10.0000 mL | Freq: Once | INTRAVENOUS | Status: AC
  Administered 2018-06-20: 10 mL
  Filled 2018-06-20: qty 10

## 2018-06-20 MED ORDER — SODIUM CHLORIDE 0.9 % IV SOLN
1000.0000 mg | Freq: Once | INTRAVENOUS | Status: AC
  Administered 2018-06-20: 1000 mg via INTRAVENOUS
  Filled 2018-06-20: qty 20

## 2018-06-20 MED ORDER — HEPARIN SOD (PORK) LOCK FLUSH 100 UNIT/ML IV SOLN
500.0000 [IU] | Freq: Once | INTRAVENOUS | Status: AC | PRN
  Administered 2018-06-20: 500 [IU]
  Filled 2018-06-20: qty 5

## 2018-06-20 NOTE — Patient Instructions (Addendum)
[facility] Discharge Instructions for Patients Receiving Chemotherapy  Today you received the following immunotherapy agent Imfinzi  To help prevent nausea and vomiting after your treatment, we encourage you to take your nausea medication as prescribed.  If you develop nausea and vomiting that is not controlled by your nausea medication, call the clinic.   BELOW ARE SYMPTOMS THAT SHOULD BE REPORTED IMMEDIATELY:  *FEVER GREATER THAN 100.5 F  *CHILLS WITH OR WITHOUT FEVER  NAUSEA AND VOMITING THAT IS NOT CONTROLLED WITH YOUR NAUSEA MEDICATION  *UNUSUAL SHORTNESS OF BREATH  *UNUSUAL BRUISING OR BLEEDING  TENDERNESS IN MOUTH AND THROAT WITH OR WITHOUT PRESENCE OF ULCERS  *URINARY PROBLEMS  *BOWEL PROBLEMS  UNUSUAL RASH Items with * indicate a potential emergency and should be followed up as soon as possible.  Feel free to call the clinic should you have any questions or concerns. The clinic phone number is [phone].  Please show the [facility] at check-in to the Emergency Department and triage nurse.  Durvalumab injection (Imfinzi) What is this medicine? DURVALUMAB (dur VAL ue mab) is a monoclonal antibody. It is used to treat urothelial cancer. This medicine may be used for other purposes; ask your health care provider or pharmacist if you have questions. COMMON BRAND NAME(S): IMFINZI What should I tell my health care provider before I take this medicine? They need to know if you have any of these conditions: -diabetes -immune system problems -infection -inflammatory bowel disease -kidney disease -liver disease -lung or breathing disease -lupus -organ transplant -stomach or intestine problems -thyroid disease -an unusual or allergic reaction to durvalumab, other medicines, foods, dyes, or preservatives -pregnant or trying to get pregnant -breast-feeding How should I use this medicine? This medicine is for infusion into a vein. It is  given by a health care professional in a hospital or clinic setting. A special MedGuide will be given to you before each treatment. Be sure to read this information carefully each time. Talk to your pediatrician regarding the use of this medicine in children. Special care may be needed. Overdosage: If you think you have taken too much of this medicine contact a poison control center or emergency room at once. NOTE: This medicine is only for you. Do not share this medicine with others. What if I miss a dose? It is important not to miss your dose. Call your doctor or health care professional if you are unable to keep an appointment. What may interact with this medicine? Interactions have not been studied. This list may not describe all possible interactions. Give your health care provider a list of all the medicines, herbs, non-prescription drugs, or dietary supplements you use. Also tell them if you smoke, drink alcohol, or use illegal drugs. Some items may interact with your medicine. What should I watch for while using this medicine? This drug may make you feel generally unwell. Continue your course of treatment even though you feel ill unless your doctor tells you to stop. You may need blood work done while you are taking this medicine. Do not become pregnant while taking this medicine or for 3 months after stopping it. Women should inform their doctor if they wish to become pregnant or think they might be pregnant. There is a potential for serious side effects to an unborn child. Talk to your health care professional or pharmacist for more information. Do not breast-feed an infant while taking this medicine or for 3 months after stopping it. What side effects may  I notice from receiving this medicine? Side effects that you should report to your doctor or health care professional as soon as possible: -allergic reactions like skin rash, itching or hives, swelling of the face, lips, or  tongue -black, tarry stools -bloody or watery diarrhea -breathing problems -change in emotions or moods -change in sex drive -changes in vision -chest pain or chest tightness -chills -confusion -cough -facial flushing -fever -headache -signs and symptoms of high blood sugar such as dizziness; dry mouth; dry skin; fruity breath; nausea; stomach pain; increased hunger or thirst; increased urination -signs and symptoms of liver injury like dark yellow or brown urine; general ill feeling or flu-like symptoms; light-colored stools; loss of appetite; nausea; right upper belly pain; unusually weak or tired; yellowing of the eyes or skin -stomach pain -trouble passing urine or change in the amount of urine -weight gain or weight loss Side effects that usually do not require medical attention (report these to your doctor or health care professional if they continue or are bothersome): -bone pain -constipation -loss of appetite -muscle pain -nausea -swelling of the ankles, feet, hands -tiredness This list may not describe all possible side effects. Call your doctor for medical advice about side effects. You may report side effects to FDA at 1-800-FDA-1088. Where should I keep my medicine? This drug is given in a hospital or clinic and will not be stored at home. NOTE: This sheet is a summary. It may not cover all possible information. If you have questions about this medicine, talk to your doctor, pharmacist, or health care provider.  2018 Elsevier/Gold Standard (2016-04-16 15:50:36)

## 2018-06-21 DIAGNOSIS — C3491 Malignant neoplasm of unspecified part of right bronchus or lung: Secondary | ICD-10-CM | POA: Diagnosis not present

## 2018-06-21 DIAGNOSIS — G894 Chronic pain syndrome: Secondary | ICD-10-CM | POA: Diagnosis not present

## 2018-06-22 ENCOUNTER — Ambulatory Visit: Payer: Medicare Other | Admitting: Pulmonary Disease

## 2018-06-26 ENCOUNTER — Encounter: Payer: Self-pay | Admitting: Radiation Oncology

## 2018-06-26 ENCOUNTER — Other Ambulatory Visit: Payer: Self-pay

## 2018-06-26 ENCOUNTER — Ambulatory Visit
Admission: RE | Admit: 2018-06-26 | Discharge: 2018-06-26 | Disposition: A | Discharge status: Home / Self Care | Payer: Medicare Other | Source: Ambulatory Visit | Attending: Radiation Oncology | Admitting: Radiation Oncology

## 2018-06-26 VITALS — BP 109/81 | HR 47 | Temp 98.4°F | Resp 18 | Ht 73.0 in | Wt 231.6 lb

## 2018-06-26 DIAGNOSIS — Z888 Allergy status to other drugs, medicaments and biological substances status: Secondary | ICD-10-CM | POA: Diagnosis not present

## 2018-06-26 DIAGNOSIS — Z7982 Long term (current) use of aspirin: Secondary | ICD-10-CM | POA: Diagnosis not present

## 2018-06-26 DIAGNOSIS — Z79899 Other long term (current) drug therapy: Secondary | ICD-10-CM | POA: Diagnosis not present

## 2018-06-26 DIAGNOSIS — C3411 Malignant neoplasm of upper lobe, right bronchus or lung: Secondary | ICD-10-CM | POA: Insufficient documentation

## 2018-06-26 DIAGNOSIS — Z923 Personal history of irradiation: Secondary | ICD-10-CM | POA: Insufficient documentation

## 2018-06-26 DIAGNOSIS — Z9221 Personal history of antineoplastic chemotherapy: Secondary | ICD-10-CM | POA: Diagnosis not present

## 2018-06-26 DIAGNOSIS — R569 Unspecified convulsions: Secondary | ICD-10-CM | POA: Insufficient documentation

## 2018-06-26 NOTE — Progress Notes (Signed)
Radiation Oncology         [phone] ________________________________  Name: [name] MRN: [id number]  Date of Service: 06/26/2018 DOB: [birth year]  Post Treatment Note  CC: Redmond School, MD  Curt Bears, MD  Diagnosis:  Stage IIIA, cT3N2M0, NSCLC, adenosquamous carcinoma of the RUL  Interval Since Last Radiation:  6 weeks   04/04/2018 - 05/18/2018: The patient was treated to the disease within the RUL lung initially to a dose of 60 Gy using a 1 field, 3-D conformal technique. The patient then received a cone down boost treatment for an additional 6 Gy. This yielded a final total dose of 66 Gy.   Narrative:  The patient returns today for routine follow-up. He tolerated radiotherapy well and his defibrillator had to be monitored daily by the device rep. He did very well though and his device did not have any known malfunctions. He had recent CT imaging following treatment that showed mild improvement in his tumor, and he is to move forward with Imfanzi as a consolidative therapy.   On review of systems, the patient states he is feeling well. He denies any new problems with shortness of breath, fevers, or chills. No skin changes were noted. No other complaints are verbalized.   ALLERGIES:  is allergic to lyrica [pregabalin] and neurontin [gabapentin].  Meds: Current Outpatient Medications  Medication Sig Dispense Refill  . alprazolam (XANAX) 2 MG tablet Take 2 mg by mouth 4 (four) times daily as needed for anxiety.     [name] amitriptyline (ELAVIL) 100 MG tablet Take 100 mg by mouth at bedtime.     [name] aspirin 81 MG EC tablet Take 81 mg by mouth daily.      [name] atorvastatin (LIPITOR) 80 MG tablet Take 1 tablet (80 mg total) by mouth daily at 6 PM. 30 tablet 12  . carvedilol (COREG) 6.25 MG tablet TAKE 1 TABLET BY MOUTH TWICE A DAY WITH A MEAL 60 tablet 6  . citalopram (CELEXA) 40 MG tablet Take 40 mg by mouth daily.  11  . furosemide (LASIX) 20 MG tablet Take 1 tablet (20 mg total)  by mouth daily. 90 tablet 3  . HYDROcodone-acetaminophen (NORCO) 10-325 MG tablet Take 1 tablet by mouth every 4 (four) hours as needed for moderate pain.   0  . lidocaine-prilocaine (EMLA) cream Apply 1 application topically as needed. 30 g 0  . lisinopril (PRINIVIL,ZESTRIL) 2.5 MG tablet Take 1 tablet (2.5 mg total) by mouth daily. 30 tablet 12  . spironolactone (ALDACTONE) 25 MG tablet TAKE 1 TABLET BY MOUTH EVERY DAY 90 tablet 3  . sucralfate (CARAFATE) 1 g tablet Take 1 tablet (1 g total) by mouth 4 (four) times daily -  with meals and at bedtime. 5 min before meals for radiation induced esophagitis 120 tablet 2  . tamsulosin (FLOMAX) 0.4 MG CAPS capsule Take 1 capsule by mouth daily.    [name] umeclidinium-vilanterol (ANORO ELLIPTA) 62.5-25 MCG/INH AEPB Inhale 1 puff into the lungs daily. 1 each 0  . nitroGLYCERIN (NITROSTAT) 0.4 MG SL tablet Place 1 tablet (0.4 mg total) under the tongue every 5 (five) minutes x 3 doses as needed for chest pain. (Patient not taking: Reported on 06/26/2018) 25 tablet 2  . prochlorperazine (COMPAZINE) 10 MG tablet Take 1 tablet (10 mg total) by mouth every 6 (six) hours as needed for nausea or vomiting. (Patient not taking: Reported on 06/26/2018) 30 tablet 0   No current facility-administered medications for this encounter.  Physical Findings:  height is 6\' 1"  (1.854 m) and weight is 231 lb 9.6 oz (105.1 kg). His oral temperature is 98.4 F (36.9 C). His blood pressure is 109/81 and his pulse is 47 (abnormal). His respiration is 18 and oxygen saturation is 98%.  Pain Assessment Pain Score: 7  Pain Loc: Leg(right leg stump)/10 In general this is a well appearing caucasian male in no acute distress. He's alert and oriented x4 and appropriate throughout the examination. Cardiopulmonary assessment is negative for acute distress and he exhibits normal effort.   Lab Findings: Lab Results  Component Value Date   WBC 6.9 06/20/2018   HGB 12.6 (L) 06/20/2018    HCT 37.9 (L) 06/20/2018   MCV 98.2 (H) 06/20/2018   PLT 144 06/20/2018     Radiographic Findings: Ct Chest W Contrast  Result Date: 06/09/2018 CLINICAL DATA:  Stage IIIA adenosquamous right upper lobe lung carcinoma diagnosed June 2019 status post concurrent chemoradiation therapy. Restaging. EXAM: CT CHEST WITH CONTRAST TECHNIQUE: Multidetector CT imaging of the chest was performed during intravenous contrast administration. CONTRAST:  32mL OMNIPAQUE IOHEXOL 300 MG/ML  SOLN COMPARISON:  03/10/2018 PET-CT.  03/03/2018 chest CT. FINDINGS: Cardiovascular: Normal heart size. No significant pericardial effusion/thickening. Left anterior descending and left circumflex coronary atherosclerosis. Single lead left subclavian ICD terminates in the right ventricular apex. Right internal jugular MediPort terminates at the cavoatrial junction. Great vessels are normal in course and caliber. No central pulmonary emboli. Mediastinum/Nodes: No discrete thyroid nodules. Unremarkable esophagus. No axillary adenopathy. No pathologically enlarged mediastinal nodes. Right hilar adenopathy has decreased to 0.7 cm from 1.2 cm (series 2/image 55). No pathologically enlarged hilar nodes. Lungs/Pleura: No pneumothorax. No pleural effusion. Mild centrilobular and paraseptal emphysema with diffuse bronchial wall thickening. Spiculated right upper lobe 6.0 x 4.2 cm lung mass (series 5/image 34), decreased from 7.9 x 7.1 cm. No acute consolidative airspace disease. Left major fissure 10 x 4 mm plaque (series 5/image 70) is stable. Stable calcified posterior left lower lobe subcentimeter granuloma. Two stable scattered left upper lobe pulmonary nodules, largest 5 mm anteriorly (series 5/image 71). No new significant pulmonary nodules. Upper abdomen: Cholecystectomy. Musculoskeletal: No aggressive appearing focal osseous lesions. Marked thoracic spondylosis. IMPRESSION: 1. Spiculated right upper lobe lung mass is decreased in size. 2.  Right hilar adenopathy is decreased. 3. No new or progressive metastatic disease in the chest. Aortic Atherosclerosis (ICD10-I70.0) and Emphysema (ICD10-J43.9). Electronically Signed   By: Ilona Sorrel M.D.   On: 06/09/2018 15:32    Impression/Plan: 1. Stage IIIA, cT3N2M0, NSCLC, adenosquamous carcinoma of the RUL. The patient is recovering well from his recent radiotherapy. He will proceed with consolidative immunotherapy and we will see him back as needed moving forward. He is in agreement with this plan.      Carola Rhine, PAC

## 2018-06-27 ENCOUNTER — Encounter: Payer: Self-pay | Admitting: Pulmonary Disease

## 2018-06-27 ENCOUNTER — Telehealth: Payer: Self-pay | Admitting: *Deleted

## 2018-06-27 ENCOUNTER — Ambulatory Visit (INDEPENDENT_AMBULATORY_CARE_PROVIDER_SITE_OTHER): Payer: Medicare Other | Admitting: Pulmonary Disease

## 2018-06-27 DIAGNOSIS — C3491 Malignant neoplasm of unspecified part of right bronchus or lung: Secondary | ICD-10-CM | POA: Diagnosis not present

## 2018-06-27 DIAGNOSIS — J449 Chronic obstructive pulmonary disease, unspecified: Secondary | ICD-10-CM | POA: Diagnosis not present

## 2018-06-27 MED ORDER — TIOTROPIUM BROMIDE-OLODATEROL 2.5-2.5 MCG/ACT IN AERS: 2.0000 | INHALATION_SPRAY | Freq: Every day | RESPIRATORY_TRACT | 5 refills | Status: DC

## 2018-06-27 NOTE — Progress Notes (Signed)
   Subjective:    Patient ID: [name], male    DOB: [birth year], 57 y.o.   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 Echo 01/2018 showed EF to be persistently low at 25% and hence AICD was placed on 03/02/2018  He presented with a P[place]ast tumor of right upper lobe with questionable chest wall invasion and right hilar lymphadenopathy, clarified on biopsy 02/2018 to be adenosquamous carcinoma stage IIIa He underwent chemotherapy and radiation followed by consolidation immunotherapy  follow-up CT 05/2018 was reviewed which shows decrease in size of right upper lobe mass and right hilar adenopathy  He has tolerated this well.  He reports occasional episodes of feeling weak and dizzy when he suddenly stands up and has to hold on the support and has a dazed look for a couple of minutes.  Head CT has been arranged by radiation oncology  He reports intermittent wheezing, I will order is not covered by his insurance, powder leaves a funny taste in his mouth He does not want a flu shot today  Significant tests/ events reviewed  PFT 02/2018 showed FEV1 of 51%, improved with bronchodilator to 57%, ratio 57, DLCO 62%  Review of Systems neg for any significant sore throat, dysphagia, itching, sneezing, nasal congestion or excess/ purulent secretions, fever, chills, sweats, unintended wt loss, pleuritic or exertional cp, hempoptysis, orthopnea pnd or change in chronic leg swelling. Also denies presyncope, palpitations, heartburn, abdominal pain, nausea, vomiting, diarrhea or change in bowel or urinary habits, dysuria,hematuria, rash, arthralgias, visual complaints, headache, numbness weakness or ataxia.     Objective:   Physical Exam   Gen. Pleasant, well-nourished, in no distress ENT - no thrush, no post nasal drip, alopecia Neck: No JVD, no thyromegaly, no  carotid bruits Lungs: no use of accessory muscles, no dullness to percussion, Bl scatteredr rhonchi  Cardiovascular: Rhythm regular, heart sounds  normal, no murmurs or gallops, no peripheral edema Musculoskeletal: No deformities, no cyanosis or clubbing       Assessment & Plan:

## 2018-06-27 NOTE — Telephone Encounter (Signed)
CALLED PATIENT TO INFORM OF CT FOR 06-28-18 - ARRIVAL TIME - 6:45 AM @ [facility], NO RESTRICTIONS TO TEST, LVM FOR  A RETURN CALL

## 2018-06-27 NOTE — Assessment & Plan Note (Signed)
Remains to be seen whether post immunotherapy, he will be a candidate for surgery

## 2018-06-27 NOTE — Patient Instructions (Signed)
Sample of Stiolto -2 puffs daily, see if this is covered by her insurance and we will send in prescription. Prescription for albuterol MDI 2 puffs every 6 hours as needed for rescue  You have to quit smoking!

## 2018-06-27 NOTE — Addendum Note (Signed)
Addended by: Vivia Ewing on: 06/27/2018 11:23 AM   Modules accepted: Orders

## 2018-06-27 NOTE — Addendum Note (Signed)
Addended by: Vivia Ewing on: 06/27/2018 12:00 PM   Modules accepted: Orders

## 2018-06-27 NOTE — Assessment & Plan Note (Signed)
Sample of Stiolto -2 puffs daily, see if this is covered by her insurance and we will send in prescription. Prescription for albuterol MDI 2 puffs every 6 hours as needed for rescue  Smoking cessation was again emphasized

## 2018-06-28 ENCOUNTER — Telehealth: Payer: Self-pay | Admitting: *Deleted

## 2018-06-28 ENCOUNTER — Ambulatory Visit (HOSPITAL_COMMUNITY)
Admission: RE | Admit: 2018-06-28 | Discharge: 2018-06-28 | Disposition: A | Discharge status: Home / Self Care | Payer: Medicare Other | Source: Ambulatory Visit | Attending: Radiation Oncology | Admitting: Radiation Oncology

## 2018-06-28 DIAGNOSIS — R569 Unspecified convulsions: Secondary | ICD-10-CM | POA: Diagnosis not present

## 2018-06-28 MED ORDER — IOHEXOL 300 MG/ML  SOLN
75.0000 mL | Freq: Once | INTRAMUSCULAR | Status: AC | PRN
  Administered 2018-06-28: 75 mL via INTRAVENOUS

## 2018-06-28 MED ORDER — SODIUM CHLORIDE 0.9 % IJ SOLN
INTRAMUSCULAR | Status: AC
  Filled 2018-06-28: qty 50

## 2018-06-28 MED ORDER — HEPARIN SOD (PORK) LOCK FLUSH 100 UNIT/ML IV SOLN
500.0000 [IU] | Freq: Once | INTRAVENOUS | Status: AC
  Administered 2018-06-28: 500 [IU] via INTRAVENOUS

## 2018-06-28 MED ORDER — HEPARIN SOD (PORK) LOCK FLUSH 100 UNIT/ML IV SOLN
INTRAVENOUS | Status: AC
  Filled 2018-06-28: qty 5

## 2018-06-28 NOTE — Telephone Encounter (Signed)
Left a voicemail for the patient letting him know the results of his CT scan.  I let him know that per PA Dara Lords he needs to keep his appointment on Friday with Dr. Mickeal Skinner.  I left my call back number if he has any questions.  Will continue to follow as necessary.  Gloriajean Dell. Leonie Green, BSN

## 2018-06-29 ENCOUNTER — Ambulatory Visit (HOSPITAL_COMMUNITY): Payer: Medicare Other

## 2018-06-30 ENCOUNTER — Telehealth: Payer: Self-pay | Admitting: *Deleted

## 2018-06-30 ENCOUNTER — Inpatient Hospital Stay: Payer: Medicare Other | Attending: Internal Medicine | Admitting: Internal Medicine

## 2018-06-30 ENCOUNTER — Telehealth: Payer: Self-pay | Admitting: Internal Medicine

## 2018-06-30 ENCOUNTER — Encounter: Payer: Self-pay | Admitting: Internal Medicine

## 2018-06-30 VITALS — BP 100/71 | HR 92 | Temp 98.0°F | Resp 18 | Ht 72.0 in

## 2018-06-30 DIAGNOSIS — Z5112 Encounter for antineoplastic immunotherapy: Secondary | ICD-10-CM | POA: Diagnosis not present

## 2018-06-30 DIAGNOSIS — Z720 Tobacco use: Secondary | ICD-10-CM | POA: Diagnosis not present

## 2018-06-30 DIAGNOSIS — Z79899 Other long term (current) drug therapy: Secondary | ICD-10-CM | POA: Diagnosis not present

## 2018-06-30 DIAGNOSIS — R569 Unspecified convulsions: Secondary | ICD-10-CM | POA: Diagnosis not present

## 2018-06-30 DIAGNOSIS — C3411 Malignant neoplasm of upper lobe, right bronchus or lung: Secondary | ICD-10-CM | POA: Insufficient documentation

## 2018-06-30 DIAGNOSIS — C3490 Malignant neoplasm of unspecified part of unspecified bronchus or lung: Secondary | ICD-10-CM

## 2018-06-30 DIAGNOSIS — Z89511 Acquired absence of right leg below knee: Secondary | ICD-10-CM | POA: Diagnosis not present

## 2018-06-30 MED ORDER — LEVETIRACETAM 750 MG PO TABS: 750.0000 mg | ORAL_TABLET | Freq: Two times a day (BID) | ORAL | 3 refills | Status: DC

## 2018-06-30 NOTE — Progress Notes (Signed)
[facility] [place], [place] [address] [phone]   New Patient Evaluation  Date of Service: 06/30/18 Patient Name: [name] Patient MRN: [id number] Patient DOB: 10/14/1960 Provider: Ventura Sellers, MD  Identifying Statement:  [name] is a 57 y.o. male with Seizures ([place]) [R56.9] who presents for initial consultation and evaluation regarding cancer associated neurologic deficits.    Referring Provider: Redmond School, MD [address] [place], [place] [address]  Primary Cancer:  Oncologic History:   Non-small cell carcinoma of right lung, stage 3 (HCC)   03/22/2018 Initial Diagnosis    Non-small cell carcinoma of right lung, stage 3 ([place])    04/03/2018 - 05/21/2018 Chemotherapy    The patient had palonosetron (ALOXI) injection 0.25 mg, 0.25 mg, Intravenous,  Once, 6 of 6 cycles Administration: 0.25 mg (04/03/2018), 0.25 mg (05/01/2018), 0.25 mg (05/15/2018), 0.25 mg (04/10/2018), 0.25 mg (04/17/2018), 0.25 mg (04/24/2018) CARBOplatin (PARAPLATIN) 300 mg in sodium chloride 0.9 % 250 mL chemo infusion, 300 mg (100 % of original dose 300 mg), Intravenous,  Once, 6 of 6 cycles Dose modification: 300 mg (original dose 300 mg, Cycle 1) Administration: 300 mg (04/03/2018), 300 mg (05/01/2018), 300 mg (05/15/2018), 300 mg (04/10/2018), 300 mg (04/17/2018), 300 mg (04/24/2018) PACLitaxel (TAXOL) 108 mg in sodium chloride 0.9 % 250 mL chemo infusion (</= 80mg /m2), 45 mg/m2 = 108 mg, Intravenous,  Once, 6 of 6 cycles Administration: 108 mg (04/03/2018), 108 mg (05/01/2018), 108 mg (05/15/2018), 108 mg (04/10/2018), 108 mg (04/17/2018), 108 mg (04/24/2018)  for chemotherapy treatment.     06/20/2018 -  Chemotherapy    The patient had durvalumab (IMFINZI) 1,000 mg in sodium chloride 0.9 % 100 mL chemo infusion, 1,040 mg, Intravenous,  Once, 1 of 13 cycles Administration: 1,000 mg (06/20/2018)  for chemotherapy treatment.      History of Present  Illness: The patient's records from the referring physician were obtained and reviewed and the patient interviewed to confirm this HPI.  ANIL HAVARD presents today to discuss recent episodes of loss of consciousness.  His wife describes events that begin as "eyes open, staring" and lead to "falling on floor shaking with eyes open for less than one minute" followed by difficulty arousing him for several minutes.  He returns to baseline after a few minutes.  This began occurring this summer (unclear exactly when) and the frequency is greater than once per week, up to 2x per day at its height.  He does describe a history of "staring spells as a boy, starting at age 68 and ending at age 58, precipitated by exertion or feeling winded".  These events have not recurred and he was not treated.  CT head was obtain which was unremarkable.  No other epilepsy risk factors.  He completed induction chemotherapy for lung cancer and is set to initiate immunotherapy with imfinzi.  Medications: Current Outpatient Medications on File Prior to Visit  Medication Sig Dispense Refill  . alprazolam (XANAX) 2 MG tablet Take 2 mg by mouth 4 (four) times daily as needed for anxiety.     [name] amitriptyline (ELAVIL) 100 MG tablet Take 100 mg by mouth at bedtime.     [name] aspirin 81 MG EC tablet Take 81 mg by mouth daily.      [name] atorvastatin (LIPITOR) 80 MG tablet Take 1 tablet (80 mg total) by mouth daily at 6 PM. 30 tablet 12  . carvedilol (COREG) 6.25 MG tablet TAKE  1 TABLET BY MOUTH TWICE A DAY WITH A MEAL 60 tablet 6  . citalopram (CELEXA) 40 MG tablet Take 40 mg by mouth daily.  11  . furosemide (LASIX) 20 MG tablet Take 1 tablet (20 mg total) by mouth daily. 90 tablet 3  . HYDROcodone-acetaminophen (NORCO) 10-325 MG tablet Take 1 tablet by mouth every 4 (four) hours as needed for moderate pain.   0  . lidocaine-prilocaine (EMLA) cream Apply 1 application topically as needed. 30 g 0  . lisinopril (PRINIVIL,ZESTRIL) 2.5 MG tablet  Take 1 tablet (2.5 mg total) by mouth daily. 30 tablet 12  . nitroGLYCERIN (NITROSTAT) 0.4 MG SL tablet Place 1 tablet (0.4 mg total) under the tongue every 5 (five) minutes x 3 doses as needed for chest pain. 25 tablet 2  . prochlorperazine (COMPAZINE) 10 MG tablet Take 1 tablet (10 mg total) by mouth every 6 (six) hours as needed for nausea or vomiting. 30 tablet 0  . spironolactone (ALDACTONE) 25 MG tablet TAKE 1 TABLET BY MOUTH EVERY DAY 90 tablet 3  . sucralfate (CARAFATE) 1 g tablet Take 1 tablet (1 g total) by mouth 4 (four) times daily -  with meals and at bedtime. 5 min before meals for radiation induced esophagitis 120 tablet 2  . tamsulosin (FLOMAX) 0.4 MG CAPS capsule Take 1 capsule by mouth daily.    . Tiotropium Bromide-Olodaterol (STIOLTO RESPIMAT) 2.5-2.5 MCG/ACT AERS Inhale 2 puffs into the lungs daily. 1 Inhaler 5  . umeclidinium-vilanterol (ANORO ELLIPTA) 62.5-25 MCG/INH AEPB Inhale 1 puff into the lungs daily. 1 each 0   No current facility-administered medications on file prior to visit.     Allergies:  Allergies  Allergen Reactions  . Lyrica [Pregabalin] Palpitations    Chest pain and heart palps.  . Neurontin [Gabapentin] Palpitations    Chest pain and heart palp   Past Medical History:  Past Medical History:  Diagnosis Date  . Acute hepatitis C virus infection 06/19/2007   Qualifier: Diagnosis of  By: Leilani Merl CMA, Tiffany    . Acute ST elevation myocardial infarction (STEMI) involving left anterior descending (LAD) coronary artery ([place]) 05/26/2016  . Acute ST elevation myocardial infarction (STEMI) of lateral wall ([place]) 05/26/2016  . 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 Anxiety   . Asthma   . Atherosclerosis of native arteries of the extremities with intermittent claudication 12/16/2011  . Chronic hepatitis C without hepatic coma ([place]) 05/03/2014  . COPD with chronic bronchitis and emphysema ([place]) 03/13/2018   FEV1 57%  . Depression   .  DVT (deep venous thrombosis) ([place]) 2009; 2019   RLE; LLE  . Encounter for antineoplastic chemotherapy 03/22/2018  . Hepatitis C    "tx'd in 2015"  . High cholesterol   . Ischemic cardiomyopathy 03/02/2018  . Non-small cell carcinoma of right lung, stage 3 ([place]) 03/22/2018  . PAD (peripheral artery disease) ([place]) 01/25/2013  . Peripheral vascular disease, unspecified 07/20/2012  . Port-A-Cath in place 04/24/2018  . ST elevation myocardial infarction involving left anterior descending (LAD) coronary artery ([place])   . STEMI (ST elevation myocardial infarction) ([place]) 05/26/2016  . Tobacco abuse 01/28/2016  . Tubular adenoma of colon 07/11/2014  . Urinary dribbling    Past Surgical History:  Past Surgical History:  Procedure Laterality Date  . AORTOGRAM  08/08/2009   for LLE claudication     By Dr. Oneida Alar  . 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 BIOPSY N/A 05/30/2014   Procedure: BIOPSY;  Surgeon: [name], MD;  Location: [facility];  Service: Endoscopy;  Laterality: N/A;  . BLADDER TUMOR EXCISION  8/812  . CARDIAC CATHETERIZATION N/A 05/26/2016   Procedure: Left Heart Cath and Coronary Angiography;  Surgeon: [name], MD;  Location: [facility];  Service: Cardiovascular;  Laterality: N/A;  . CARDIAC CATHETERIZATION N/A 05/26/2016   Procedure: Coronary Stent Intervention;  Surgeon: [name], MD;  Location: [facility];  Service: Cardiovascular;  Laterality: N/A;  . COLONOSCOPY WITH PROPOFOL N/A 05/30/2014   ERX:[id number] colonic polyp-likely source of hematochezia-removed as described above  . ESOPHAGOGASTRODUODENOSCOPY (EGD) WITH PROPOFOL N/A 05/30/2014   PYP:PJKDTO and bulbar erosions s/p gastric biopsy. No evidence of portal gastropathy on today's examination.  . FEMORAL-TIBIAL BYPASS GRAFT  2009   Right side using non-reversed GSV   By Dr. Oneida Alar  . FEMORAL-TIBIAL BYPASS GRAFT  11/24/2007   Right femoral to anterior tibial BPG   by Dr. Oneida Alar  . FINGER SURGERY Left     "straightened my pinky"  . HAND TENDON SURGERY Left 2013   Left 5th finger  . HERNIA REPAIR     "stomach"  . ICD IMPLANT N/A 03/02/2018   Procedure: ICD IMPLANT;  Surgeon: [name], MD;  Location: [facility];  Service: Cardiovascular;  Laterality: N/A;  . INCISIONAL HERNIA REPAIR N/A 12/25/2014   Procedure: HERNIA REPAIR INCISIONAL WITH MESH;  Surgeon: [name], MD;  Location: [facility];  Service: General;  Laterality: N/A;  . INSERTION OF MESH N/A 12/25/2014   Procedure: INSERTION OF MESH;  Surgeon: [name], MD;  Location: [facility];  Service: General;  Laterality: N/A;  . IR IMAGING GUIDED PORT INSERTION  04/11/2018  . LAPAROSCOPIC CHOLECYSTECTOMY    . LOWER EXTREMITY ANGIOGRAM Left 12/30/2015   Procedure: Lower Extremity Angiogram;  Surgeon: [name], MD;  Location: [facility];  Service: Cardiovascular;  Laterality: Left;  . PERIPHERAL VASCULAR CATHETERIZATION N/A 12/30/2015   Procedure: Abdominal Aortogram;  Surgeon: [name], MD;  Location: [facility];  Service: Cardiovascular;  Laterality: N/A;  . POLYPECTOMY  05/30/2014   Procedure: POLYPECTOMY;  Surgeon: [name], MD;  Location: [facility];  Service: Endoscopy;;  . TONSILLECTOMY AND ADENOIDECTOMY  ~ 1970  . TYMPANOSTOMY TUBE PLACEMENT Bilateral ~ 1970   Social History:  Social History   Socioeconomic History  . Marital status: Married    Spouse name: Not on file  . Number of children: Not on file  . Years of education: Not on file  . Highest education level: Not on file  Occupational History  . Not on file  Social Needs  . Financial resource strain: Not on file  . Food insecurity:    Worry: Not on file    Inability: Not on file  . Transportation needs:    Medical: Not on file    Non-medical: Not on file  Tobacco Use  . Smoking status: Current Every Day Smoker    Packs/day: 0.50    Years: 31.00    Pack years: 15.50    Types: Cigarettes  . Smokeless tobacco:  Never Used  . Tobacco comment: half a pack/day now  Substance and Sexual Activity  . Alcohol use: Yes    Alcohol/week: 18.0 standard drinks    Types: 18 Cans of beer per week    Comment:  history of heavy ETOH use; 03/02/2018 "6-12 beers Fri & Sat"  . Drug use:  Yes    Types: Marijuana    Comment: 03/02/2018 "weekly"  . Sexual activity: Not Currently  Lifestyle  . Physical activity:    Days per week: Not on file    Minutes per session: Not on file  . Stress: Not on file  Relationships  . Social connections:    Talks on phone: Not on file    Gets together: Not on file    Attends religious service: Not on file    Active member of club or organization: Not on file    Attends meetings of clubs or organizations: Not on file    Relationship status: Not on file  . Intimate partner violence:    Fear of current or ex partner: Not on file    Emotionally abused: Not on file    Physically abused: Not on file    Forced sexual activity: Not on file  Other Topics Concern  . Not on file  Social History Narrative  . Not on file   Family History:  Family History  Problem Relation Age of Onset  . Vision loss Mother   . Ulcers Father   . Colon cancer Neg Hx     Review of Systems: Constitutional: Denies fevers, chills or abnormal weight loss Eyes: Denies blurriness of vision Ears, nose, mouth, throat, and face: Denies mucositis or sore throat Respiratory: Denies cough, dyspnea or wheezes Cardiovascular: Denies palpitation, chest discomfort or lower extremity swelling Gastrointestinal:  Denies nausea, constipation, diarrhea GU: Denies dysuria or incontinence Skin: Denies abnormal skin rashes Neurological: Per HPI Musculoskeletal: Denies joint pain, back or neck discomfort. No decrease in ROM Behavioral/Psych: Denies anxiety, disturbance in thought content, and mood instability   Physical Exam: Vitals:   06/30/18 1108  BP: 100/71  Pulse: 92  Resp: 18  Temp: 98 F (36.7 C)  SpO2:  97%   KPS: 70. General: Alert, cooperative, pleasant, in no acute distress in wheelchair Head: Craniotomy scar noted, dry and intact. EENT: No conjunctival injection or scleral icterus. Oral mucosa moist Lungs: Resp effort normal Cardiac: Regular rate and rhythm Abdomen: Soft, non-distended abdomen Skin: No rashes cyanosis or petechiae. Extremities: Right BKA  Neurologic Exam: Mental Status: Awake, alert, attentive to examiner. Oriented to self and environment. Language is fluent with intact comprehension.  Cranial Nerves: Visual acuity is grossly normal. Visual fields are full. Extra-ocular movements intact. No ptosis. Face is symmetric, tongue midline. Motor: Tone and bulk are normal. Power is full in both arms and legs. Reflexes are symmetric, no pathologic reflexes present. Intact finger to nose bilaterally Sensory: Intact to light touch and temperature Gait: Deferred  Labs: I have reviewed the data as listed    Component Value Date/Time   NA 136 06/20/2018 1251   NA 139 02/27/2018 1457   K 4.4 06/20/2018 1251   CL 99 06/20/2018 1251   CO2 30 06/20/2018 1251   GLUCOSE 86 06/20/2018 1251   BUN 8 06/20/2018 1251   BUN 7 02/27/2018 1457   CREATININE 0.91 06/20/2018 1251   CREATININE 1.13 06/28/2016 1453   CALCIUM 9.2 06/20/2018 1251   PROT 7.1 06/20/2018 1251   PROT 6.5 01/31/2018 1124   ALBUMIN 3.9 06/20/2018 1251   ALBUMIN 4.4 01/31/2018 1124   AST 17 06/20/2018 1251   ALT 24 06/20/2018 1251   ALKPHOS 136 (H) 06/20/2018 1251   BILITOT 0.4 06/20/2018 1251   GFRNONAA >60 06/20/2018 1251   GFRNONAA 74 06/28/2016 1453   GFRAA >60 06/20/2018 1251   GFRAA 85  06/28/2016 1453   Lab Results  Component Value Date   WBC 6.9 06/20/2018   NEUTROABS 5.2 06/20/2018   HGB 12.6 (L) 06/20/2018   HCT 37.9 (L) 06/20/2018   MCV 98.2 (H) 06/20/2018   PLT 144 06/20/2018    Imaging:  Ct Head W Wo Contrast  Result Date: 06/28/2018 CLINICAL DATA:  Seizure like activity. History  of lung cancer status post chemo radiation. EXAM: CT HEAD WITHOUT AND WITH CONTRAST TECHNIQUE: Contiguous axial images were obtained from the base of the skull through the vertex without and with intravenous contrast CONTRAST:  73mL OMNIPAQUE IOHEXOL 300 MG/ML  SOLN COMPARISON:  03/31/2018 FINDINGS: Brain: There is no evidence of acute infarct, intracranial hemorrhage, mass, midline shift, or extra-axial fluid collection. The ventricles and sulci are normal. No abnormal enhancement is identified. Vascular: No hyperdense vessel on noncontrast imaging. Major dural venous sinuses are grossly patent. Skull: No fracture focal osseous lesion. Sinuses/Orbits: Partially visualized right maxillary sinus mucous retention cyst. No significant mastoid fluid. Unremarkable orbits. Other: Unchanged subcentimeter calcification in the right parietal scalp IMPRESSION: Unremarkable CT appearance of the brain. No evidence of metastatic disease or acute abnormality. Electronically Signed   By: Logan Bores M.D.   On: 06/28/2018 08:34   Ct Chest W Contrast  Result Date: 06/09/2018 CLINICAL DATA:  Stage IIIA adenosquamous right upper lobe lung carcinoma diagnosed June 2019 status post concurrent chemoradiation therapy. Restaging. EXAM: CT CHEST WITH CONTRAST TECHNIQUE: Multidetector CT imaging of the chest was performed during intravenous contrast administration. CONTRAST:  65mL OMNIPAQUE IOHEXOL 300 MG/ML  SOLN COMPARISON:  03/10/2018 PET-CT.  03/03/2018 chest CT. FINDINGS: Cardiovascular: Normal heart size. No significant pericardial effusion/thickening. Left anterior descending and left circumflex coronary atherosclerosis. Single lead left subclavian ICD terminates in the right ventricular apex. Right internal jugular MediPort terminates at the cavoatrial junction. Great vessels are normal in course and caliber. No central pulmonary emboli. Mediastinum/Nodes: No discrete thyroid nodules. Unremarkable esophagus. No axillary  adenopathy. No pathologically enlarged mediastinal nodes. Right hilar adenopathy has decreased to 0.7 cm from 1.2 cm (series 2/image 55). No pathologically enlarged hilar nodes. Lungs/Pleura: No pneumothorax. No pleural effusion. Mild centrilobular and paraseptal emphysema with diffuse bronchial wall thickening. Spiculated right upper lobe 6.0 x 4.2 cm lung mass (series 5/image 34), decreased from 7.9 x 7.1 cm. No acute consolidative airspace disease. Left major fissure 10 x 4 mm plaque (series 5/image 70) is stable. Stable calcified posterior left lower lobe subcentimeter granuloma. Two stable scattered left upper lobe pulmonary nodules, largest 5 mm anteriorly (series 5/image 71). No new significant pulmonary nodules. Upper abdomen: Cholecystectomy. Musculoskeletal: No aggressive appearing focal osseous lesions. Marked thoracic spondylosis. IMPRESSION: 1. Spiculated right upper lobe lung mass is decreased in size. 2. Right hilar adenopathy is decreased. 3. No new or progressive metastatic disease in the chest. Aortic Atherosclerosis (ICD10-I70.0) and Emphysema (ICD10-J43.9). Electronically Signed   By: Ilona Sorrel M.D.   On: 06/09/2018 15:32     Assessment/Plan 1. Seizures [facility])  Mr. Lundeen presents today with a clinical syndrome most consistent with epileptic events.  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    That said, his current cancer diagnosis raises heightened suspicion for metastatic foci, with focal events and secondary generalization.  Sensitivity of CT might be too low to pick up small metastases at the corticomedullary junction.    We recommended initiating AED therapy with Keppra 750mg  BID.  We have ordered  an MRI brain with and w/o contrast for further evaluation.  His ICD and stents are labeled as MRI compliant according to information provided by the patient which was cross-referenced on [employer]'s website.  Case/order will  need to be reviewed and cleared by radiology.    We spent twenty additional minutes teaching regarding the natural history, biology, and historical experience in the treatment of neurologic complications of cancer. We also provided teaching sheets for the patient to take home as an additional resource.  We appreciate the opportunity to participate in the care of [place].  He should return to clinic in 1 month or sooner as needed depending on MRI results.  All questions were answered. The patient knows to call the clinic with any problems, questions or concerns. No barriers to learning were detected.  The total time spent in the encounter was 40 minutes and more than 50% was on counseling and review of test results   Ventura Sellers, MD Medical Director of Neuro-Oncology [facility] 06/30/18 4:09 PM

## 2018-06-30 NOTE — Telephone Encounter (Signed)
"  Maggie with [facility] [phone]) calling to ask Dr. Julien Nordmann if lab work will be ordered before next weeks chemotherapy."

## 2018-06-30 NOTE — Telephone Encounter (Signed)
Appts scheduled AVS/Calendar printed per 10/4 los °

## 2018-07-03 ENCOUNTER — Other Ambulatory Visit: Payer: Self-pay | Admitting: *Deleted

## 2018-07-03 DIAGNOSIS — C3491 Malignant neoplasm of unspecified part of right bronchus or lung: Secondary | ICD-10-CM

## 2018-07-04 ENCOUNTER — Telehealth: Payer: Self-pay | Admitting: Oncology

## 2018-07-04 ENCOUNTER — Inpatient Hospital Stay: Payer: Medicare Other

## 2018-07-04 ENCOUNTER — Telehealth: Payer: Self-pay | Admitting: Internal Medicine

## 2018-07-04 ENCOUNTER — Inpatient Hospital Stay (HOSPITAL_BASED_OUTPATIENT_CLINIC_OR_DEPARTMENT_OTHER): Payer: Medicare Other | Admitting: Oncology

## 2018-07-04 VITALS — BP 99/69 | HR 67 | Temp 98.4°F | Resp 16

## 2018-07-04 DIAGNOSIS — Z5112 Encounter for antineoplastic immunotherapy: Secondary | ICD-10-CM | POA: Diagnosis not present

## 2018-07-04 DIAGNOSIS — C3491 Malignant neoplasm of unspecified part of right bronchus or lung: Secondary | ICD-10-CM

## 2018-07-04 DIAGNOSIS — C3411 Malignant neoplasm of upper lobe, right bronchus or lung: Secondary | ICD-10-CM | POA: Diagnosis not present

## 2018-07-04 DIAGNOSIS — R569 Unspecified convulsions: Secondary | ICD-10-CM | POA: Diagnosis not present

## 2018-07-04 DIAGNOSIS — Z720 Tobacco use: Secondary | ICD-10-CM | POA: Diagnosis not present

## 2018-07-04 DIAGNOSIS — Z89511 Acquired absence of right leg below knee: Secondary | ICD-10-CM | POA: Diagnosis not present

## 2018-07-04 DIAGNOSIS — Z79899 Other long term (current) drug therapy: Secondary | ICD-10-CM | POA: Diagnosis not present

## 2018-07-04 LAB — CBC WITH DIFFERENTIAL (CANCER CENTER ONLY)
Abs Immature Granulocytes: 0.05 10*3/uL (ref 0.00–0.07)
Basophils Absolute: 0.1 10*3/uL (ref 0.0–0.1)
Basophils Relative: 1 %
EOS ABS: 0.3 10*3/uL (ref 0.0–0.5)
EOS PCT: 5 %
HEMATOCRIT: 40.8 % (ref 39.0–52.0)
HEMOGLOBIN: 13.1 g/dL (ref 13.0–17.0)
Immature Granulocytes: 1 %
LYMPHS PCT: 10 %
Lymphs Abs: 0.6 10*3/uL — ABNORMAL LOW (ref 0.7–4.0)
MCH: 32.3 pg (ref 26.0–34.0)
MCHC: 32.1 g/dL (ref 30.0–36.0)
MCV: 100.7 fL — AB (ref 80.0–100.0)
MONO ABS: 0.7 10*3/uL (ref 0.1–1.0)
Monocytes Relative: 10 %
NRBC: 0 % (ref 0.0–0.2)
Neutro Abs: 4.7 10*3/uL (ref 1.7–7.7)
Neutrophils Relative %: 73 %
Platelet Count: 166 10*3/uL (ref 150–400)
RBC: 4.05 MIL/uL — ABNORMAL LOW (ref 4.22–5.81)
RDW: 15.9 % — AB (ref 11.5–15.5)
WBC Count: 6.4 10*3/uL (ref 4.0–10.5)

## 2018-07-04 LAB — CMP (CANCER CENTER ONLY)
ALBUMIN: 4 g/dL (ref 3.5–5.0)
ALT: 24 U/L (ref 0–44)
AST: 21 U/L (ref 15–41)
Alkaline Phosphatase: 112 U/L (ref 38–126)
Anion gap: 8 (ref 5–15)
BUN: 10 mg/dL (ref 6–20)
CALCIUM: 9.6 mg/dL (ref 8.9–10.3)
CHLORIDE: 101 mmol/L (ref 98–111)
CO2: 31 mmol/L (ref 22–32)
CREATININE: 1.06 mg/dL (ref 0.61–1.24)
GFR, Estimated: >60 mL/min (ref >60)
Glucose, Bld: 85 mg/dL (ref 70–99)
Potassium: 4.4 mmol/L (ref 3.5–5.1)
Sodium: 140 mmol/L (ref 135–145)
Total Bilirubin: 0.5 mg/dL (ref 0.3–1.2)
Total Protein: 7.3 g/dL (ref 6.5–8.1)

## 2018-07-04 MED ORDER — SODIUM CHLORIDE 0.9% FLUSH
10.0000 mL | INTRAVENOUS | Status: DC | PRN
  Administered 2018-07-04: 10 mL
  Filled 2018-07-04: qty 10

## 2018-07-04 MED ORDER — SODIUM CHLORIDE 0.9 % IV SOLN
Freq: Once | INTRAVENOUS | Status: AC
  Administered 2018-07-04: 13:00:00 via INTRAVENOUS
  Filled 2018-07-04: qty 250

## 2018-07-04 MED ORDER — HEPARIN SOD (PORK) LOCK FLUSH 100 UNIT/ML IV SOLN
500.0000 [IU] | Freq: Once | INTRAVENOUS | Status: AC | PRN
  Administered 2018-07-04: 500 [IU]
  Filled 2018-07-04: qty 5

## 2018-07-04 MED ORDER — SODIUM CHLORIDE 0.9 % IV SOLN
9.6000 mg/kg | Freq: Once | INTRAVENOUS | Status: AC
  Administered 2018-07-04: 1000 mg via INTRAVENOUS
  Filled 2018-07-04: qty 20

## 2018-07-04 NOTE — Patient Instructions (Signed)
[facility] Discharge Instructions for Patients Receiving Chemotherapy  Today you received the following immunotherapy agent Imfinzi  To help prevent nausea and vomiting after your treatment, we encourage you to take your nausea medication as prescribed.  If you develop nausea and vomiting that is not controlled by your nausea medication, call the clinic.   BELOW ARE SYMPTOMS THAT SHOULD BE REPORTED IMMEDIATELY:  *FEVER GREATER THAN 100.5 F  *CHILLS WITH OR WITHOUT FEVER  NAUSEA AND VOMITING THAT IS NOT CONTROLLED WITH YOUR NAUSEA MEDICATION  *UNUSUAL SHORTNESS OF BREATH  *UNUSUAL BRUISING OR BLEEDING  TENDERNESS IN MOUTH AND THROAT WITH OR WITHOUT PRESENCE OF ULCERS  *URINARY PROBLEMS  *BOWEL PROBLEMS  UNUSUAL RASH Items with * indicate a potential emergency and should be followed up as soon as possible.  Feel free to call the clinic should you have any questions or concerns. The clinic phone number is [phone].  Please show the [facility] at check-in to the Emergency Department and triage nurse.  Durvalumab injection (Imfinzi) What is this medicine? DURVALUMAB (dur VAL ue mab) is a monoclonal antibody. It is used to treat urothelial cancer. This medicine may be used for other purposes; ask your health care provider or pharmacist if you have questions. COMMON BRAND NAME(S): IMFINZI What should I tell my health care provider before I take this medicine? They need to know if you have any of these conditions: -diabetes -immune system problems -infection -inflammatory bowel disease -kidney disease -liver disease -lung or breathing disease -lupus -organ transplant -stomach or intestine problems -thyroid disease -an unusual or allergic reaction to durvalumab, other medicines, foods, dyes, or preservatives -pregnant or trying to get pregnant -breast-feeding How should I use this medicine? This medicine is for infusion into a vein. It is  given by a health care professional in a hospital or clinic setting. A special MedGuide will be given to you before each treatment. Be sure to read this information carefully each time. Talk to your pediatrician regarding the use of this medicine in children. Special care may be needed. Overdosage: If you think you have taken too much of this medicine contact a poison control center or emergency room at once. NOTE: This medicine is only for you. Do not share this medicine with others. What if I miss a dose? It is important not to miss your dose. Call your doctor or health care professional if you are unable to keep an appointment. What may interact with this medicine? Interactions have not been studied. This list may not describe all possible interactions. Give your health care provider a list of all the medicines, herbs, non-prescription drugs, or dietary supplements you use. Also tell them if you smoke, drink alcohol, or use illegal drugs. Some items may interact with your medicine. What should I watch for while using this medicine? This drug may make you feel generally unwell. Continue your course of treatment even though you feel ill unless your doctor tells you to stop. You may need blood work done while you are taking this medicine. Do not become pregnant while taking this medicine or for 3 months after stopping it. Women should inform their doctor if they wish to become pregnant or think they might be pregnant. There is a potential for serious side effects to an unborn child. Talk to your health care professional or pharmacist for more information. Do not breast-feed an infant while taking this medicine or for 3 months after stopping it. What side effects may  I notice from receiving this medicine? Side effects that you should report to your doctor or health care professional as soon as possible: -allergic reactions like skin rash, itching or hives, swelling of the face, lips, or  tongue -black, tarry stools -bloody or watery diarrhea -breathing problems -change in emotions or moods -change in sex drive -changes in vision -chest pain or chest tightness -chills -confusion -cough -facial flushing -fever -headache -signs and symptoms of high blood sugar such as dizziness; dry mouth; dry skin; fruity breath; nausea; stomach pain; increased hunger or thirst; increased urination -signs and symptoms of liver injury like dark yellow or brown urine; general ill feeling or flu-like symptoms; light-colored stools; loss of appetite; nausea; right upper belly pain; unusually weak or tired; yellowing of the eyes or skin -stomach pain -trouble passing urine or change in the amount of urine -weight gain or weight loss Side effects that usually do not require medical attention (report these to your doctor or health care professional if they continue or are bothersome): -bone pain -constipation -loss of appetite -muscle pain -nausea -swelling of the ankles, feet, hands -tiredness This list may not describe all possible side effects. Call your doctor for medical advice about side effects. You may report side effects to FDA at 1-800-FDA-1088. Where should I keep my medicine? This drug is given in a hospital or clinic and will not be stored at home. NOTE: This sheet is a summary. It may not cover all possible information. If you have questions about this medicine, talk to your doctor, pharmacist, or health care provider.  2018 Elsevier/Gold Standard (2016-04-16 15:50:36)

## 2018-07-04 NOTE — Progress Notes (Signed)
[facility] OFFICE PROGRESS NOTE  [place] School, MD [place] [address]  DIAGNOSIS:stage IIIA (T3, N2, M0) non-small cell lung cancer, adenosquamous carcinoma diagnosed in June 2019 and presented with large right upper lobe lung mass with questionable chest wall invasion as well as right hilar and mediastinal lymphadenopathy.  Biomarker Findings Tumor Mutational Burden - TMB-High (21 Muts/Mb) Microsatellite status - MS-Stable Genomic Findings For a complete list of the genes assayed, please refer to the Appendix. ATM C6237* CCND2 P281R KRAS G12V CHEK2 T367f*15 TP53 E298* 7 Disease relevant genes with no reportable alterations: ALK, EGFR, BRAF, MET, RET, ERBB2, ROS1  PRIOR THERAPY:Concurrent chemoradiation with chemotherapy consisting of weekly carboplatin for an AUC of 2 and paclitaxel 45 mg/m2. First dose given on 04/03/2018.Status post 6 cycles.  Last dose was giving 05/15/2018 with partial response.  CURRENT THERAPY:Consolidation treatment with immunotherapy with Imfinzi (Durvalumab) 10 mg/KG every 2 weeks.  First dose 06/20/2018.  Status post 1 cycle.  INTERVAL HISTORY: Mark SALVATO57y.o. male returns for routine follow-up visit.  The patient was seen in the infusion room today.  The patient has no specific complaints.  He denies fevers and chills.  Denies chest pain, shortness of breath, cough, hemoptysis.  Denies nausea, vomiting, constipation, diarrhea.  Denies recent weight loss or night sweats.  He tolerated his first cycle of Imfinzi fairly well.  The patient is here for evaluation prior to cycle #2.  MEDICAL HISTORY: Past Medical History:  Diagnosis Date  . Acute hepatitis C virus infection 06/19/2007   Qualifier: Diagnosis of  By: MLeilani MerlCMA, Tiffany    . Acute ST elevation myocardial infarction (STEMI) involving left anterior descending (LAD) coronary artery ([facility] 05/26/2016  . Acute ST elevation myocardial infarction (STEMI)  of lateral wall ([facility] 05/26/2016  . 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 Anxiety   . Asthma   . Atherosclerosis of native arteries of the extremities with intermittent claudication 12/16/2011  . Chronic hepatitis C without hepatic coma ([facility] 05/03/2014  . COPD with chronic bronchitis and emphysema ([facility] 03/13/2018   FEV1 57%  . Depression   . DVT (deep venous thrombosis) ([facility] 2009; 2019   RLE; LLE  . Encounter for antineoplastic chemotherapy 03/22/2018  . Hepatitis C    "tx'd in 2015"  . High cholesterol   . Ischemic cardiomyopathy 03/02/2018  . Non-small cell carcinoma of right lung, stage 3 ([facility] 03/22/2018  . PAD (peripheral artery disease) ([facility] 01/25/2013  . Peripheral vascular disease, unspecified 07/20/2012  . Port-A-Cath in place 04/24/2018  . ST elevation myocardial infarction involving left anterior descending (LAD) coronary artery ([facility]   . STEMI (ST elevation myocardial infarction) ([facility] 05/26/2016  . Tobacco abuse 01/28/2016  . Tubular adenoma of colon 07/11/2014  . Urinary dribbling     ALLERGIES:  is allergic to lyrica [pregabalin] and neurontin [gabapentin].  MEDICATIONS:  Current Outpatient Medications  Medication Sig Dispense Refill  . alprazolam (XANAX) 2 MG tablet Take 2 mg by mouth 4 (four) times daily as needed for anxiety.     .Marland Kitchenamitriptyline (ELAVIL) 100 MG tablet Take 100 mg by mouth at bedtime.     .Marland Kitchenaspirin 81 MG EC tablet Take 81 mg by mouth daily.      .Marland Kitchenatorvastatin (LIPITOR) 80 MG tablet Take 1 tablet (80 mg total) by mouth daily at 6 PM. 30 tablet 12  . carvedilol (COREG) 6.25 MG tablet TAKE 1 TABLET BY MOUTH TWICE A DAY WITH A MEAL  60 tablet 6  . citalopram (CELEXA) 40 MG tablet Take 40 mg by mouth daily.  11  . furosemide (LASIX) 20 MG tablet Take 1 tablet (20 mg total) by mouth daily. 90 tablet 3  . HYDROcodone-acetaminophen (NORCO) 10-325 MG tablet Take 1 tablet by mouth every 4 (four) hours as needed for moderate pain.   0  .  levETIRAcetam (KEPPRA) 750 MG tablet Take 1 tablet (750 mg total) by mouth 2 (two) times daily. 60 tablet 3  . lidocaine-prilocaine (EMLA) cream Apply 1 application topically as needed. 30 g 0  . lisinopril (PRINIVIL,ZESTRIL) 2.5 MG tablet Take 1 tablet (2.5 mg total) by mouth daily. 30 tablet 12  . nitroGLYCERIN (NITROSTAT) 0.4 MG SL tablet Place 1 tablet (0.4 mg total) under the tongue every 5 (five) minutes x 3 doses as needed for chest pain. 25 tablet 2  . prochlorperazine (COMPAZINE) 10 MG tablet Take 1 tablet (10 mg total) by mouth every 6 (six) hours as needed for nausea or vomiting. 30 tablet 0  . spironolactone (ALDACTONE) 25 MG tablet TAKE 1 TABLET BY MOUTH EVERY DAY 90 tablet 3  . sucralfate (CARAFATE) 1 g tablet Take 1 tablet (1 g total) by mouth 4 (four) times daily -  with meals and at bedtime. 5 min before meals for radiation induced esophagitis 120 tablet 2  . tamsulosin (FLOMAX) 0.4 MG CAPS capsule Take 1 capsule by mouth daily.    . Tiotropium Bromide-Olodaterol (STIOLTO RESPIMAT) 2.5-2.5 MCG/ACT AERS Inhale 2 puffs into the lungs daily. 1 Inhaler 5  . umeclidinium-vilanterol (ANORO ELLIPTA) 62.5-25 MCG/INH AEPB Inhale 1 puff into the lungs daily. 1 each 0   No current facility-administered medications for this visit.    Facility-Administered Medications Ordered in Other Visits  Medication Dose Route Frequency Provider Last Rate Last Dose  . durvalumab (IMFINZI) 1,000 mg in sodium chloride 0.9 % 100 mL chemo infusion  9.6 mg/kg (Treatment Plan Recorded) Intravenous Once Curt Bears, MD      . heparin lock flush 100 unit/mL  500 Units Intracatheter Once PRN Curt Bears, MD      . sodium chloride flush (NS) 0.9 % injection 10 mL  10 mL Intracatheter PRN Curt Bears, MD        SURGICAL HISTORY:  Past Surgical History:  Procedure Laterality Date  . AORTOGRAM  08/08/2009   for LLE claudication     By Dr. Oneida Alar  . 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  BIOPSY N/A 05/30/2014   Procedure: BIOPSY;  Surgeon: [name], MD;  Location: [facility];  Service: Endoscopy;  Laterality: N/A;  . BLADDER TUMOR EXCISION  8/812  . CARDIAC CATHETERIZATION N/A 05/26/2016   Procedure: Left Heart Cath and Coronary Angiography;  Surgeon: [name], MD;  Location: [facility];  Service: Cardiovascular;  Laterality: N/A;  . CARDIAC CATHETERIZATION N/A 05/26/2016   Procedure: Coronary Stent Intervention;  Surgeon: [name], MD;  Location: [facility];  Service: Cardiovascular;  Laterality: N/A;  . COLONOSCOPY WITH PROPOFOL N/A 05/30/2014   YIF:OYDXAJ colonic polyp-likely source of hematochezia-removed as described above  . ESOPHAGOGASTRODUODENOSCOPY (EGD) WITH PROPOFOL N/A 05/30/2014   OIN:OMVEHM and bulbar erosions s/p gastric biopsy. No evidence of portal gastropathy on today's examination.  . FEMORAL-TIBIAL BYPASS GRAFT  2009   Right side using non-reversed GSV   By Dr. Oneida Alar  . FEMORAL-TIBIAL BYPASS GRAFT  11/24/2007   Right femoral to anterior tibial BPG   by Dr.  Fields  . FINGER SURGERY Left    "straightened my pinky"  . HAND TENDON SURGERY Left 2013   Left 5th finger  . HERNIA REPAIR     "stomach"  . ICD IMPLANT N/A 03/02/2018   Procedure: ICD IMPLANT;  Surgeon: [name], MD;  Location: [facility];  Service: Cardiovascular;  Laterality: N/A;  . INCISIONAL HERNIA REPAIR N/A 12/25/2014   Procedure: HERNIA REPAIR INCISIONAL WITH MESH;  Surgeon: [name], MD;  Location: [facility];  Service: General;  Laterality: N/A;  . INSERTION OF MESH N/A 12/25/2014   Procedure: INSERTION OF MESH;  Surgeon: [name], MD;  Location: [facility];  Service: General;  Laterality: N/A;  . IR IMAGING GUIDED PORT INSERTION  04/11/2018  . LAPAROSCOPIC CHOLECYSTECTOMY    . LOWER EXTREMITY ANGIOGRAM Left 12/30/2015   Procedure: Lower Extremity Angiogram;  Surgeon: [name], MD;  Location: [facility];  Service: Cardiovascular;   Laterality: Left;  . PERIPHERAL VASCULAR CATHETERIZATION N/A 12/30/2015   Procedure: Abdominal Aortogram;  Surgeon: [name], MD;  Location: [facility];  Service: Cardiovascular;  Laterality: N/A;  . POLYPECTOMY  05/30/2014   Procedure: POLYPECTOMY;  Surgeon: [name], MD;  Location: [facility];  Service: Endoscopy;;  . TONSILLECTOMY AND ADENOIDECTOMY  ~ 1970  . TYMPANOSTOMY TUBE PLACEMENT Bilateral ~ 1970    REVIEW OF SYSTEMS:   Review of Systems  Constitutional: Negative for appetite change, chills, fatigue, fever and unexpected weight change.  HENT:   Negative for mouth sores, nosebleeds, sore throat and trouble swallowing.   Eyes: Negative for eye problems and icterus.  Respiratory: Negative for cough, hemoptysis, shortness of breath and wheezing.   Cardiovascular: Negative for chest pain and leg swelling.  Gastrointestinal: Negative for abdominal pain, constipation, diarrhea, nausea and vomiting.  Genitourinary: Negative for bladder incontinence, difficulty urinating, dysuria, frequency and hematuria.   Musculoskeletal: Negative for back pain, neck pain and neck stiffness.  Skin: Negative for itching and rash.  Neurological: Negative for dizziness, extremity weakness, headaches, light-headedness and seizures.  Hematological: Negative for adenopathy. Does not bruise/bleed easily.  Psychiatric/Behavioral: Negative for confusion, depression and sleep disturbance. The patient is not nervous/anxious.     PHYSICAL EXAMINATION:  Temperature 98.4, pulse 67, blood pressure 99/69, respirations 16, O2 sat 100% on room air  ECOG PERFORMANCE STATUS: 1 - Symptomatic but completely ambulatory  Physical Exam  Constitutional: Oriented to person, place, and time and well-developed, well-nourished, and in no distress. No distress.  HENT:  Head: Normocephalic and atraumatic.  Mouth/Throat: Oropharynx is clear and moist. No oropharyngeal exudate.  Eyes: Conjunctivae are normal.  Right eye exhibits no discharge. Left eye exhibits no discharge. No scleral icterus.  Neck: Normal range of motion. Neck supple.  Cardiovascular: Normal rate, regular rhythm, normal heart sounds and intact distal pulses.   Pulmonary/Chest: Scattered expiratory wheezes.  No respiratory distress. Abdominal: Soft. Bowel sounds are normal. Exhibits no distension and no mass. There is no tenderness.  Musculoskeletal: Normal range of motion. Exhibits no edema.  Lymphadenopathy:    No cervical adenopathy.  Neurological: Alert and oriented to person, place, and time. Exhibits normal muscle tone. Coordination normal.  Skin: Skin is warm and dry. No rash noted. Not diaphoretic. No erythema. No pallor.  Psychiatric: Mood, memory and judgment normal.  Vitals reviewed.  LABORATORY DATA: Lab Results  Component Value Date   WBC 6.4 07/04/2018   HGB 13.1 07/04/2018   HCT 40.8 07/04/2018  MCV 100.7 (H) 07/04/2018   PLT 166 07/04/2018      Chemistry      Component Value Date/Time   NA 140 07/04/2018 1226   NA 139 02/27/2018 1457   K 4.4 07/04/2018 1226   CL 101 07/04/2018 1226   CO2 31 07/04/2018 1226   BUN 10 07/04/2018 1226   BUN 7 02/27/2018 1457   CREATININE 1.06 07/04/2018 1226   CREATININE 1.13 06/28/2016 1453      Component Value Date/Time   CALCIUM 9.6 07/04/2018 1226   ALKPHOS 112 07/04/2018 1226   AST 21 07/04/2018 1226   ALT 24 07/04/2018 1226   BILITOT 0.5 07/04/2018 1226       RADIOGRAPHIC STUDIES:  Ct Head W Wo Contrast  Result Date: 06/28/2018 CLINICAL DATA:  Seizure like activity. History of lung cancer status post chemo radiation. EXAM: CT HEAD WITHOUT AND WITH CONTRAST TECHNIQUE: Contiguous axial images were obtained from the base of the skull through the vertex without and with intravenous contrast CONTRAST:  56m OMNIPAQUE IOHEXOL 300 MG/ML  SOLN COMPARISON:  03/31/2018 FINDINGS: Brain: There is no evidence of acute infarct, intracranial hemorrhage, mass, midline  shift, or extra-axial fluid collection. The ventricles and sulci are normal. No abnormal enhancement is identified. Vascular: No hyperdense vessel on noncontrast imaging. Major dural venous sinuses are grossly patent. Skull: No fracture focal osseous lesion. Sinuses/Orbits: Partially visualized right maxillary sinus mucous retention cyst. No significant mastoid fluid. Unremarkable orbits. Other: Unchanged subcentimeter calcification in the right parietal scalp IMPRESSION: Unremarkable CT appearance of the brain. No evidence of metastatic disease or acute abnormality. Electronically Signed   By: ALogan BoresM.D.   On: 06/28/2018 08:34   Ct Chest W Contrast  Result Date: 06/09/2018 CLINICAL DATA:  Stage IIIA adenosquamous right upper lobe lung carcinoma diagnosed June 2019 status post concurrent chemoradiation therapy. Restaging. EXAM: CT CHEST WITH CONTRAST TECHNIQUE: Multidetector CT imaging of the chest was performed during intravenous contrast administration. CONTRAST:  736mOMNIPAQUE IOHEXOL 300 MG/ML  SOLN COMPARISON:  03/10/2018 PET-CT.  03/03/2018 chest CT. FINDINGS: Cardiovascular: Normal heart size. No significant pericardial effusion/thickening. Left anterior descending and left circumflex coronary atherosclerosis. Single lead left subclavian ICD terminates in the right ventricular apex. Right internal jugular MediPort terminates at the cavoatrial junction. Great vessels are normal in course and caliber. No central pulmonary emboli. Mediastinum/Nodes: No discrete thyroid nodules. Unremarkable esophagus. No axillary adenopathy. No pathologically enlarged mediastinal nodes. Right hilar adenopathy has decreased to 0.7 cm from 1.2 cm (series 2/image 55). No pathologically enlarged hilar nodes. Lungs/Pleura: No pneumothorax. No pleural effusion. Mild centrilobular and paraseptal emphysema with diffuse bronchial wall thickening. Spiculated right upper lobe 6.0 x 4.2 cm lung mass (series 5/image 34),  decreased from 7.9 x 7.1 cm. No acute consolidative airspace disease. Left major fissure 10 x 4 mm plaque (series 5/image 70) is stable. Stable calcified posterior left lower lobe subcentimeter granuloma. Two stable scattered left upper lobe pulmonary nodules, largest 5 mm anteriorly (series 5/image 71). No new significant pulmonary nodules. Upper abdomen: Cholecystectomy. Musculoskeletal: No aggressive appearing focal osseous lesions. Marked thoracic spondylosis. IMPRESSION: 1. Spiculated right upper lobe lung mass is decreased in size. 2. Right hilar adenopathy is decreased. 3. No new or progressive metastatic disease in the chest. Aortic Atherosclerosis (ICD10-I70.0) and Emphysema (ICD10-J43.9). Electronically Signed   By: JaIlona Sorrel.D.   On: 06/09/2018 15:32     ASSESSMENT/PLAN:  Non-small cell carcinoma of right lung, stage 3 (HCC) This is a very pleasant  57 year old white male recently diagnosed with a stage IIIa non-small cell lung cancer, adenosquamous carcinoma.  He underwent a course of concurrent chemoradiation with weekly carboplatin and paclitaxel status post 6 cycles. The patient tolerated this course of concurrent chemoradiation fairly well except for mild odynophagia and dysphagia.  The CT scan following treatment showed improvement of his disease with partial response. The patient is currently on consolidation treatment with immunotherapy with Imfinzi (Durvalumab) 10 mg/KG every 2 weeks.  Status post 1 cycle which he tolerated fairly well with no concerning complaints.  Labs been reviewed.  Recommend for him to proceed with cycle #2 today as scheduled. He will return in 2 weeks for evaluation prior to cycle #3.  He was advised to call immediately if he has any concerning symptoms in the interval. The patient voices understanding of current disease status and treatment options and is in agreement with the current care plan.  All questions were answered. The patient knows to call  the clinic with any problems, questions or concerns. We can certainly see the patient much sooner if necessary.   No orders of the defined types were placed in this encounter.    Mikey Bussing, DNP, AGPCNP-BC, AOCNP 07/04/18

## 2018-07-04 NOTE — Assessment & Plan Note (Signed)
This is a very pleasant 57 year old white male recently diagnosed with a stage IIIa non-small cell lung cancer, adenosquamous carcinoma.  He underwent a course of concurrent chemoradiation with weekly carboplatin and paclitaxel status post 6 cycles. The patient tolerated this course of concurrent chemoradiation fairly well except for mild odynophagia and dysphagia.  The CT scan following treatment showed improvement of his disease with partial response. The patient is currently on consolidation treatment with immunotherapy with Imfinzi (Durvalumab) 10 mg/KG every 2 weeks.  Status post 1 cycle which he tolerated fairly well with no concerning complaints.  Labs been reviewed.  Recommend for him to proceed with cycle #2 today as scheduled. He will return in 2 weeks for evaluation prior to cycle #3.  He was advised to call immediately if he has any concerning symptoms in the interval. The patient voices understanding of current disease status and treatment options and is in agreement with the current care plan.  All questions were answered. The patient knows to call the clinic with any problems, questions or concerns. We can certainly see the patient much sooner if necessary.

## 2018-07-04 NOTE — Telephone Encounter (Signed)
Scheduled appt per 10/8 los - pt to get an updated schedule next visit.   

## 2018-07-04 NOTE — Telephone Encounter (Signed)
Tried to reach regarding lab

## 2018-07-18 ENCOUNTER — Inpatient Hospital Stay (HOSPITAL_BASED_OUTPATIENT_CLINIC_OR_DEPARTMENT_OTHER): Payer: Medicare Other | Admitting: Oncology

## 2018-07-18 ENCOUNTER — Inpatient Hospital Stay: Payer: Medicare Other

## 2018-07-18 ENCOUNTER — Inpatient Hospital Stay: Payer: Medicare Other | Admitting: Nurse Practitioner

## 2018-07-18 ENCOUNTER — Encounter: Payer: Self-pay | Admitting: Oncology

## 2018-07-18 ENCOUNTER — Telehealth: Payer: Self-pay | Admitting: Oncology

## 2018-07-18 ENCOUNTER — Other Ambulatory Visit: Payer: Self-pay

## 2018-07-18 VITALS — BP 106/60 | HR 80 | Temp 98.7°F | Resp 18 | Ht 72.0 in | Wt 226.7 lb

## 2018-07-18 DIAGNOSIS — Z95828 Presence of other vascular implants and grafts: Secondary | ICD-10-CM

## 2018-07-18 DIAGNOSIS — Z89511 Acquired absence of right leg below knee: Secondary | ICD-10-CM | POA: Diagnosis not present

## 2018-07-18 DIAGNOSIS — Z720 Tobacco use: Secondary | ICD-10-CM | POA: Diagnosis not present

## 2018-07-18 DIAGNOSIS — R569 Unspecified convulsions: Secondary | ICD-10-CM

## 2018-07-18 DIAGNOSIS — Z5112 Encounter for antineoplastic immunotherapy: Secondary | ICD-10-CM

## 2018-07-18 DIAGNOSIS — Z79899 Other long term (current) drug therapy: Secondary | ICD-10-CM | POA: Diagnosis not present

## 2018-07-18 DIAGNOSIS — C3411 Malignant neoplasm of upper lobe, right bronchus or lung: Secondary | ICD-10-CM

## 2018-07-18 DIAGNOSIS — C3491 Malignant neoplasm of unspecified part of right bronchus or lung: Secondary | ICD-10-CM

## 2018-07-18 LAB — CMP (CANCER CENTER ONLY)
ALT: 55 U/L — ABNORMAL HIGH (ref 0–44)
AST: 47 U/L — AB (ref 15–41)
Albumin: 3.9 g/dL (ref 3.5–5.0)
Alkaline Phosphatase: 126 U/L (ref 38–126)
Anion gap: 10 (ref 5–15)
BILIRUBIN TOTAL: 0.4 mg/dL (ref 0.3–1.2)
BUN: 11 mg/dL (ref 6–20)
CO2: 30 mmol/L (ref 22–32)
Calcium: 9.4 mg/dL (ref 8.9–10.3)
Chloride: 99 mmol/L (ref 98–111)
Creatinine: 0.96 mg/dL (ref 0.61–1.24)
GFR, Est AFR Am: >60 mL/min (ref >60)
GFR, Estimated: >60 mL/min (ref >60)
Glucose, Bld: 90 mg/dL (ref 70–99)
POTASSIUM: 4.1 mmol/L (ref 3.5–5.1)
Sodium: 139 mmol/L (ref 135–145)
TOTAL PROTEIN: 7.2 g/dL (ref 6.5–8.1)

## 2018-07-18 LAB — CBC WITH DIFFERENTIAL (CANCER CENTER ONLY)
ABS IMMATURE GRANULOCYTES: 0.06 10*3/uL (ref 0.00–0.07)
BASOS PCT: 1 %
Basophils Absolute: 0.1 10*3/uL (ref 0.0–0.1)
Eosinophils Absolute: 0.4 10*3/uL (ref 0.0–0.5)
Eosinophils Relative: 6 %
HCT: 40.9 % (ref 39.0–52.0)
Hemoglobin: 13.4 g/dL (ref 13.0–17.0)
Immature Granulocytes: 1 %
LYMPHS PCT: 11 %
Lymphs Abs: 0.7 10*3/uL (ref 0.7–4.0)
MCH: 32.9 pg (ref 26.0–34.0)
MCHC: 32.8 g/dL (ref 30.0–36.0)
MCV: 100.5 fL — AB (ref 80.0–100.0)
MONOS PCT: 10 %
Monocytes Absolute: 0.6 10*3/uL (ref 0.1–1.0)
NEUTROS ABS: 4.5 10*3/uL (ref 1.7–7.7)
Neutrophils Relative %: 71 %
PLATELETS: 168 10*3/uL (ref 150–400)
RBC: 4.07 MIL/uL — ABNORMAL LOW (ref 4.22–5.81)
RDW: 14.3 % (ref 11.5–15.5)
WBC Count: 6.3 10*3/uL (ref 4.0–10.5)
nRBC: 0 % (ref 0.0–0.2)

## 2018-07-18 LAB — TSH: TSH: 0.368 u[IU]/mL (ref 0.320–4.118)

## 2018-07-18 MED ORDER — SODIUM CHLORIDE 0.9 % IV SOLN
9.6000 mg/kg | Freq: Once | INTRAVENOUS | Status: AC
  Administered 2018-07-18: 1000 mg via INTRAVENOUS
  Filled 2018-07-18: qty 20

## 2018-07-18 MED ORDER — HEPARIN SOD (PORK) LOCK FLUSH 100 UNIT/ML IV SOLN
500.0000 [IU] | Freq: Once | INTRAVENOUS | Status: AC | PRN
  Administered 2018-07-18: 500 [IU]
  Filled 2018-07-18: qty 5

## 2018-07-18 MED ORDER — SODIUM CHLORIDE 0.9% FLUSH
10.0000 mL | INTRAVENOUS | Status: DC | PRN
  Administered 2018-07-18: 10 mL
  Filled 2018-07-18: qty 10

## 2018-07-18 MED ORDER — SODIUM CHLORIDE 0.9% FLUSH
10.0000 mL | Freq: Once | INTRAVENOUS | Status: AC
  Administered 2018-07-18: 10 mL
  Filled 2018-07-18: qty 10

## 2018-07-18 MED ORDER — SODIUM CHLORIDE 0.9 % IV SOLN
Freq: Once | INTRAVENOUS | Status: AC
  Administered 2018-07-18: 15:00:00 via INTRAVENOUS
  Filled 2018-07-18: qty 250

## 2018-07-18 NOTE — Telephone Encounter (Signed)
3 cycles already scheduled per 10/22 los - no additional appts added.   

## 2018-07-18 NOTE — Progress Notes (Signed)
[facility] OFFICE PROGRESS NOTE  [place] [address], MD [place] [address]  DIAGNOSIS:stage IIIA (T3, N2, M0) non-small cell lung cancer, adenosquamous carcinoma diagnosed in June 2019 and presented with large right upper lobe lung mass with questionable chest wall invasion as well as right hilar and mediastinal lymphadenopathy.  Biomarker Findings Tumor Mutational Burden - TMB-High (21 Muts/Mb) Microsatellite status - MS-Stable Genomic Findings For a complete list of the genes assayed, please refer to the Appendix. ATM W1093* CCND2 P281R KRAS G12V CHEK2 T357f*15 TP53 E298* 7 Disease relevant genes with no reportable alterations: ALK, EGFR, BRAF, MET, RET, ERBB2, ROS1  PRIOR THERAPY:Concurrent chemoradiation with chemotherapy consisting of weekly carboplatin for an AUC of 2 and paclitaxel 45 mg/m2. First dose given on 04/03/2018.Status post6cycles.Last dose was giving 05/15/2018 with partial response.  CURRENT THERAPY:Consolidation treatment with immunotherapy with Imfinzi (Durvalumab) 10 mg/KG every 2 weeks. First dose 06/20/2018.  Status post 2 cycles.  INTERVAL HISTORY: Mark HELLE89y.o. male returns for routine follow-up visit coming by his wife.  The patient is feeling fine today and has no specific complaints.  He denies fevers and chills.  Denies chest pain, shortness of breath, cough, hemoptysis.  Denies nausea, vomiting, constipation, diarrhea.  Denies recent weight loss or night sweats.  He reports that he still has ongoing seizures but these are improved with Keppra.  He states that during these episodes, he has a dazed look but comes out of it quickly.  The patient's wife thinks that he is only taking his Keppra once a day however it is prescribed twice a day.  He is tolerating treatment with Imfinzi fairly well.  The patient is here for evaluation prior to cycle #3 of treatment.  MEDICAL HISTORY: Past Medical History:   Diagnosis Date  . Acute hepatitis C virus infection 06/19/2007   Qualifier: Diagnosis of  By: MLeilani MerlCMA, Tiffany    . Acute ST elevation myocardial infarction (STEMI) involving left anterior descending (LAD) coronary artery ([facility] 05/26/2016  . Acute ST elevation myocardial infarction (STEMI) of lateral wall ([facility] 05/26/2016  . 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 Anxiety   . Asthma   . Atherosclerosis of native arteries of the extremities with intermittent claudication 12/16/2011  . Chronic hepatitis C without hepatic coma ([facility] 05/03/2014  . COPD with chronic bronchitis and emphysema ([facility] 03/13/2018   FEV1 57%  . Depression   . DVT (deep venous thrombosis) ([facility] 2009; 2019   RLE; LLE  . Encounter for antineoplastic chemotherapy 03/22/2018  . Hepatitis C    "tx'd in 2015"  . High cholesterol   . Ischemic cardiomyopathy 03/02/2018  . Non-small cell carcinoma of right lung, stage 3 ([facility] 03/22/2018  . PAD (peripheral artery disease) ([facility] 01/25/2013  . Peripheral vascular disease, unspecified 07/20/2012  . Port-A-Cath in place 04/24/2018  . ST elevation myocardial infarction involving left anterior descending (LAD) coronary artery ([facility]   . STEMI (ST elevation myocardial infarction) ([facility] 05/26/2016  . Tobacco abuse 01/28/2016  . Tubular adenoma of colon 07/11/2014  . Urinary dribbling     ALLERGIES:  is allergic to lyrica [pregabalin] and neurontin [gabapentin].  MEDICATIONS:  Current Outpatient Medications  Medication Sig Dispense Refill  . alprazolam (XANAX) 2 MG tablet Take 2 mg by mouth 4 (four) times daily as needed for anxiety.     .Marland Kitchenamitriptyline (ELAVIL) 100 MG tablet Take 100 mg by mouth at bedtime.     .Marland Kitchenaspirin 81 MG EC tablet  Take 81 mg by mouth daily.      [name] atorvastatin (LIPITOR) 80 MG tablet Take 1 tablet (80 mg total) by mouth daily at 6 PM. 30 tablet 12  . carvedilol (COREG) 6.25 MG tablet TAKE 1 TABLET BY MOUTH TWICE A DAY WITH A MEAL 60 tablet  6  . citalopram (CELEXA) 40 MG tablet Take 40 mg by mouth daily.  11  . furosemide (LASIX) 20 MG tablet Take 1 tablet (20 mg total) by mouth daily. 90 tablet 3  . HYDROcodone-acetaminophen (NORCO) 10-325 MG tablet Take 1 tablet by mouth every 4 (four) hours as needed for moderate pain.   0  . levETIRAcetam (KEPPRA) 750 MG tablet Take 1 tablet (750 mg total) by mouth 2 (two) times daily. 60 tablet 3  . lidocaine-prilocaine (EMLA) cream Apply 1 application topically as needed. 30 g 0  . lisinopril (PRINIVIL,ZESTRIL) 2.5 MG tablet Take 1 tablet (2.5 mg total) by mouth daily. 30 tablet 12  . nitroGLYCERIN (NITROSTAT) 0.4 MG SL tablet Place 1 tablet (0.4 mg total) under the tongue every 5 (five) minutes x 3 doses as needed for chest pain. 25 tablet 2  . prochlorperazine (COMPAZINE) 10 MG tablet Take 1 tablet (10 mg total) by mouth every 6 (six) hours as needed for nausea or vomiting. 30 tablet 0  . spironolactone (ALDACTONE) 25 MG tablet TAKE 1 TABLET BY MOUTH EVERY DAY 90 tablet 3  . sucralfate (CARAFATE) 1 g tablet Take 1 tablet (1 g total) by mouth 4 (four) times daily -  with meals and at bedtime. 5 min before meals for radiation induced esophagitis 120 tablet 2  . tamsulosin (FLOMAX) 0.4 MG CAPS capsule Take 1 capsule by mouth daily.    . Tiotropium Bromide-Olodaterol (STIOLTO RESPIMAT) 2.5-2.5 MCG/ACT AERS Inhale 2 puffs into the lungs daily. 1 Inhaler 5  . umeclidinium-vilanterol (ANORO ELLIPTA) 62.5-25 MCG/INH AEPB Inhale 1 puff into the lungs daily. 1 each 0   No current facility-administered medications for this visit.     SURGICAL HISTORY:  Past Surgical History:  Procedure Laterality Date  . AORTOGRAM  08/08/2009   for LLE claudication     By Dr. Oneida Alar  . 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 BIOPSY N/A 05/30/2014   Procedure: BIOPSY;  Surgeon: [name], MD;  Location: [facility];  Service: Endoscopy;  Laterality: N/A;  . BLADDER TUMOR EXCISION  8/812  . CARDIAC  CATHETERIZATION N/A 05/26/2016   Procedure: Left Heart Cath and Coronary Angiography;  Surgeon: [name], MD;  Location: [facility];  Service: Cardiovascular;  Laterality: N/A;  . CARDIAC CATHETERIZATION N/A 05/26/2016   Procedure: Coronary Stent Intervention;  Surgeon: [name], MD;  Location: [facility];  Service: Cardiovascular;  Laterality: N/A;  . COLONOSCOPY WITH PROPOFOL N/A 05/30/2014   JQZ:ESPQZR colonic polyp-likely source of hematochezia-removed as described above  . ESOPHAGOGASTRODUODENOSCOPY (EGD) WITH PROPOFOL N/A 05/30/2014   AQT:MAUQJF and bulbar erosions s/p gastric biopsy. No evidence of portal gastropathy on today's examination.  . FEMORAL-TIBIAL BYPASS GRAFT  2009   Right side using non-reversed GSV   By Dr. Oneida Alar  . FEMORAL-TIBIAL BYPASS GRAFT  11/24/2007   Right femoral to anterior tibial BPG   by Dr. Oneida Alar  . FINGER SURGERY Left    "straightened my pinky"  . HAND TENDON SURGERY Left 2013   Left 5th finger  . HERNIA REPAIR     "stomach"  . ICD IMPLANT N/A 03/02/2018  Procedure: ICD IMPLANT;  Surgeon: [name], MD;  Location: [facility];  Service: Cardiovascular;  Laterality: N/A;  . INCISIONAL HERNIA REPAIR N/A 12/25/2014   Procedure: HERNIA REPAIR INCISIONAL WITH MESH;  Surgeon: [name], MD;  Location: [facility];  Service: General;  Laterality: N/A;  . INSERTION OF MESH N/A 12/25/2014   Procedure: INSERTION OF MESH;  Surgeon: [name], MD;  Location: [facility];  Service: General;  Laterality: N/A;  . IR IMAGING GUIDED PORT INSERTION  04/11/2018  . LAPAROSCOPIC CHOLECYSTECTOMY    . LOWER EXTREMITY ANGIOGRAM Left 12/30/2015   Procedure: Lower Extremity Angiogram;  Surgeon: [name], MD;  Location: [facility];  Service: Cardiovascular;  Laterality: Left;  . PERIPHERAL VASCULAR CATHETERIZATION N/A 12/30/2015   Procedure: Abdominal Aortogram;  Surgeon: [name], MD;  Location: [facility];   Service: Cardiovascular;  Laterality: N/A;  . POLYPECTOMY  05/30/2014   Procedure: POLYPECTOMY;  Surgeon: [name], MD;  Location: [facility];  Service: Endoscopy;;  . TONSILLECTOMY AND ADENOIDECTOMY  ~ 1970  . TYMPANOSTOMY TUBE PLACEMENT Bilateral ~ 1970    REVIEW OF SYSTEMS:   Review of Systems  Constitutional: Negative for appetite change, chills, fatigue, fever and unexpected weight change.  HENT:   Negative for mouth sores, nosebleeds, sore throat and trouble swallowing.   Eyes: Negative for eye problems and icterus.  Respiratory: Negative for cough, hemoptysis, shortness of breath and wheezing.   Cardiovascular: Negative for chest pain and leg swelling.  Gastrointestinal: Negative for abdominal pain, constipation, diarrhea, nausea and vomiting.  Genitourinary: Negative for bladder incontinence, difficulty urinating, dysuria, frequency and hematuria.   Musculoskeletal: Negative for back pain, neck pain and neck stiffness.  Skin: Negative for itching and rash.  Neurological: Negative for dizziness, extremity weakness, headaches, light-headedness.  Positive for seizures. Hematological: Negative for adenopathy. Does not bruise/bleed easily.  Psychiatric/Behavioral: Negative for confusion, depression and sleep disturbance. The patient is not nervous/anxious.     PHYSICAL EXAMINATION:  Blood pressure 106/60, pulse 80, temperature 98.7 F (37.1 C), temperature source Oral, resp. rate 18, height 6' (1.829 m), weight 226 lb 11.2 oz (102.8 kg), SpO2 99 %.  ECOG PERFORMANCE STATUS: 1 - Symptomatic but completely ambulatory  Physical Exam  Constitutional: Oriented to person, place, and time and well-developed, well-nourished, and in no distress. No distress.  HENT:  Head: Normocephalic and atraumatic.  Mouth/Throat: Oropharynx is clear and moist. No oropharyngeal exudate.  Eyes: Conjunctivae are normal. Right eye exhibits no discharge. Left eye exhibits no discharge. No scleral icterus.   Neck: Normal range of motion. Neck supple.  Cardiovascular: Normal rate, regular rhythm, normal heart sounds.   Pulmonary/Chest: Effort normal and breath sounds normal. No respiratory distress. No wheezes. No rales.  Abdominal: Soft. Bowel sounds are normal. Exhibits no distension and no mass. There is no tenderness.  Musculoskeletal: Normal range of motion. Exhibits no edema.  Right below the knee amputation. Lymphadenopathy:    No cervical adenopathy.  Neurological: Alert and oriented to person, place, and time. Exhibits normal muscle tone. Coordination normal.  Skin: Skin is warm and dry. No rash noted. Not diaphoretic. No erythema. No pallor.  Psychiatric: Mood, memory and judgment normal.  Vitals reviewed.  LABORATORY DATA: Lab Results  Component Value Date   WBC 6.3 07/18/2018   HGB 13.4 07/18/2018   HCT 40.9 07/18/2018   MCV 100.5 (H) 07/18/2018   PLT 168 07/18/2018      Chemistry  Component Value Date/Time   NA 140 07/04/2018 1226   NA 139 02/27/2018 1457   K 4.4 07/04/2018 1226   CL 101 07/04/2018 1226   CO2 31 07/04/2018 1226   BUN 10 07/04/2018 1226   BUN 7 02/27/2018 1457   CREATININE 1.06 07/04/2018 1226   CREATININE 1.13 06/28/2016 1453      Component Value Date/Time   CALCIUM 9.6 07/04/2018 1226   ALKPHOS 112 07/04/2018 1226   AST 21 07/04/2018 1226   ALT 24 07/04/2018 1226   BILITOT 0.5 07/04/2018 1226       RADIOGRAPHIC STUDIES:  Ct Head W Wo Contrast  Result Date: 06/28/2018 CLINICAL DATA:  Seizure like activity. History of lung cancer status post chemo radiation. EXAM: CT HEAD WITHOUT AND WITH CONTRAST TECHNIQUE: Contiguous axial images were obtained from the base of the skull through the vertex without and with intravenous contrast CONTRAST:  75m OMNIPAQUE IOHEXOL 300 MG/ML  SOLN COMPARISON:  03/31/2018 FINDINGS: Brain: There is no evidence of acute infarct, intracranial hemorrhage, mass, midline shift, or extra-axial fluid collection. The  ventricles and sulci are normal. No abnormal enhancement is identified. Vascular: No hyperdense vessel on noncontrast imaging. Major dural venous sinuses are grossly patent. Skull: No fracture focal osseous lesion. Sinuses/Orbits: Partially visualized right maxillary sinus mucous retention cyst. No significant mastoid fluid. Unremarkable orbits. Other: Unchanged subcentimeter calcification in the right parietal scalp IMPRESSION: Unremarkable CT appearance of the brain. No evidence of metastatic disease or acute abnormality. Electronically Signed   By: ALogan BoresM.D.   On: 06/28/2018 08:34     ASSESSMENT/PLAN:  Non-small cell carcinoma of right lung, stage 3 (HCC) This is a very pleasant 57year old white male recently diagnosed with a stage IIIa non-small cell lung cancer, adenosquamous carcinoma. He underwenta course of concurrent chemoradiation with weekly carboplatin and paclitaxel status post 6cycles. The patient tolerated this course of concurrent chemoradiation fairly well except for mild odynophagia and dysphagia.  The CT scan following treatment showed improvement of his disease with partial response. The patient is currently on consolidation treatment with immunotherapy with Imfinzi (Durvalumab) 10 mg/KG every 2 weeks.  Status post 3 cycles which he is tolerating fairly well with no concerning complaints.  Recommend for him to proceed with cycle 4 today as scheduled.  He will follow-up in 2 weeks for evaluation prior to cycle #5.  For his seizures, he will keep his follow-up with Dr. VMickeal Skinneras previously scheduled.  He is due to have an MRI of the brain in the near future.  I have reviewed with the patient and his wife that he should be taking his Keppra twice a day instead of once a day.    He was advised to call immediately if he has any concerning symptoms in the interval. The patient voices understanding of current disease status and treatment options and is in agreement with the  current care plan.  All questions were answered. The patient knows to call the clinic with any problems, questions or concerns. We can certainly see the patient much sooner if necessary.   No orders of the defined types were placed in this encounter.    KMikey Bussing DNP, AGPCNP-BC, AOCNP 07/18/18

## 2018-07-18 NOTE — Assessment & Plan Note (Addendum)
This is a very pleasant 57 year old white male recently diagnosed with a stage IIIa non-small cell lung cancer, adenosquamous carcinoma. He underwenta course of concurrent chemoradiation with weekly carboplatin and paclitaxel status post 6cycles. The patient tolerated this course of concurrent chemoradiation fairly well except for mild odynophagia and dysphagia.  The CT scan following treatment showed improvement of his disease with partial response. The patient is currently on consolidation treatment with immunotherapy with Imfinzi (Durvalumab) 10 mg/KG every 2 weeks.  Status post 3 cycles which he is tolerating fairly well with no concerning complaints.  Recommend for him to proceed with cycle 4 today as scheduled.  He will follow-up in 2 weeks for evaluation prior to cycle #5.  For his seizures, he will keep his follow-up with Dr. Mickeal Skinner as previously scheduled.  He is due to have an MRI of the brain in the near future.  I have reviewed with the patient and his wife that he should be taking his Keppra twice a day instead of once a day.    He was advised to call immediately if he has any concerning symptoms in the interval. The patient voices understanding of current disease status and treatment options and is in agreement with the current care plan.  All questions were answered. The patient knows to call the clinic with any problems, questions or concerns. We can certainly see the patient much sooner if necessary.

## 2018-07-18 NOTE — Patient Instructions (Signed)
[facility] Discharge Instructions for Patients Receiving Chemotherapy  Today you received the following chemotherapy agents Durvalumab (Gemzar).  To help prevent nausea and vomiting after your treatment, we encourage you to take your nausea medication as prescribed.   If you develop nausea and vomiting that is not controlled by your nausea medication, call the clinic.   BELOW ARE SYMPTOMS THAT SHOULD BE REPORTED IMMEDIATELY:  *FEVER GREATER THAN 100.5 F  *CHILLS WITH OR WITHOUT FEVER  NAUSEA AND VOMITING THAT IS NOT CONTROLLED WITH YOUR NAUSEA MEDICATION  *UNUSUAL SHORTNESS OF BREATH  *UNUSUAL BRUISING OR BLEEDING  TENDERNESS IN MOUTH AND THROAT WITH OR WITHOUT PRESENCE OF ULCERS  *URINARY PROBLEMS  *BOWEL PROBLEMS  UNUSUAL RASH Items with * indicate a potential emergency and should be followed up as soon as possible.  Feel free to call the clinic should you have any questions or concerns. The clinic phone number is [phone].  Please show the [facility] at check-in to the Emergency Department and triage nurse.

## 2018-07-18 NOTE — Telephone Encounter (Signed)
pts appts updates per 10/22 sch message

## 2018-07-21 ENCOUNTER — Telehealth: Payer: Self-pay

## 2018-07-21 ENCOUNTER — Inpatient Hospital Stay (HOSPITAL_BASED_OUTPATIENT_CLINIC_OR_DEPARTMENT_OTHER): Payer: Medicare Other | Admitting: Internal Medicine

## 2018-07-21 VITALS — BP 97/72 | HR 75 | Temp 97.8°F | Resp 18 | Ht 72.0 in | Wt 227.0 lb

## 2018-07-21 DIAGNOSIS — Z720 Tobacco use: Secondary | ICD-10-CM

## 2018-07-21 DIAGNOSIS — R569 Unspecified convulsions: Secondary | ICD-10-CM | POA: Diagnosis not present

## 2018-07-21 DIAGNOSIS — C3411 Malignant neoplasm of upper lobe, right bronchus or lung: Secondary | ICD-10-CM | POA: Diagnosis not present

## 2018-07-21 DIAGNOSIS — Z89511 Acquired absence of right leg below knee: Secondary | ICD-10-CM | POA: Diagnosis not present

## 2018-07-21 DIAGNOSIS — Z79899 Other long term (current) drug therapy: Secondary | ICD-10-CM | POA: Diagnosis not present

## 2018-07-21 DIAGNOSIS — Z5112 Encounter for antineoplastic immunotherapy: Secondary | ICD-10-CM | POA: Diagnosis not present

## 2018-07-21 NOTE — Telephone Encounter (Signed)
Per 10/25 no los

## 2018-07-21 NOTE — Progress Notes (Signed)
[facility] [place], [place] [address] [phone]   Interval Evaluation  Date of Service: 07/21/18 Patient Name: [name] Patient MRN: [id number] Patient DOB: Sep 01, 1961 Provider: Ventura Sellers, MD  Identifying Statement:  [name] is a 57 y.o. male with seizures  Primary Cancer:  Oncologic History:   Non-small cell carcinoma of right lung, stage 3 (HCC)   03/22/2018 Initial Diagnosis    Non-small cell carcinoma of right lung, stage 3 ([place])    04/03/2018 - 05/21/2018 Chemotherapy    The patient had palonosetron (ALOXI) injection 0.25 mg, 0.25 mg, Intravenous,  Once, 6 of 6 cycles Administration: 0.25 mg (04/03/2018), 0.25 mg (05/01/2018), 0.25 mg (05/15/2018), 0.25 mg (04/10/2018), 0.25 mg (04/17/2018), 0.25 mg (04/24/2018) CARBOplatin (PARAPLATIN) 300 mg in sodium chloride 0.9 % 250 mL chemo infusion, 300 mg (100 % of original dose 300 mg), Intravenous,  Once, 6 of 6 cycles Dose modification: 300 mg (original dose 300 mg, Cycle 1) Administration: 300 mg (04/03/2018), 300 mg (05/01/2018), 300 mg (05/15/2018), 300 mg (04/10/2018), 300 mg (04/17/2018), 300 mg (04/24/2018) PACLitaxel (TAXOL) 108 mg in sodium chloride 0.9 % 250 mL chemo infusion (</= 80mg /m2), 45 mg/m2 = 108 mg, Intravenous,  Once, 6 of 6 cycles Administration: 108 mg (04/03/2018), 108 mg (05/01/2018), 108 mg (05/15/2018), 108 mg (04/10/2018), 108 mg (04/17/2018), 108 mg (04/24/2018)  for chemotherapy treatment.     06/20/2018 -  Chemotherapy    The patient had durvalumab (IMFINZI) 1,000 mg in sodium chloride 0.9 % 100 mL chemo infusion, 1,040 mg, Intravenous,  Once, 3 of 13 cycles Administration: 1,000 mg (06/20/2018), 1,000 mg (07/04/2018), 1,000 mg (07/18/2018)  for chemotherapy treatment.      Interval History:  [name] presents today for seizure follow up.  He describes significant improvement (decline) in quantity of stereotypical events since starting Keppra.  He may  have had one episode of altered awareness but he isn't sure that was a seizure event.  Otherwise denies new or progressive neurologic deficits.  He has not yet obtained the MRI brain we ordered for him.  H+P: Patient presents to discuss recent episodes of loss of consciousness.  His wife describes events that begin as "eyes open, staring" and lead to "falling on floor shaking with eyes open for less than one minute" followed by difficulty arousing him for several minutes.  He returns to baseline after a few minutes.  This began occurring this summer (unclear exactly when) and the frequency is greater than once per week, up to 2x per day at its height.  He does describe a history of "staring spells as a boy, starting at age 50 and ending at age 24, precipitated by exertion or feeling winded".  These events have not recurred and he was not treated.  CT head was obtain which was unremarkable.  No other epilepsy risk factors.  He completed induction chemotherapy for lung cancer and is set to initiate immunotherapy with imfinzi.  Medications: Current Outpatient Medications on File Prior to Visit  Medication Sig Dispense Refill  . alprazolam (XANAX) 2 MG tablet Take 2 mg by mouth 4 (four) times daily as needed for anxiety.     [name] amitriptyline (ELAVIL) 100 MG tablet Take 100 mg by mouth at bedtime.     [name] aspirin 81 MG EC tablet Take 81 mg by mouth daily.      [name] atorvastatin (LIPITOR) 80 MG tablet Take 1 tablet (80 mg  total) by mouth daily at 6 PM. 30 tablet 12  . carvedilol (COREG) 6.25 MG tablet TAKE 1 TABLET BY MOUTH TWICE A DAY WITH A MEAL 60 tablet 6  . citalopram (CELEXA) 40 MG tablet Take 40 mg by mouth daily.  11  . furosemide (LASIX) 20 MG tablet Take 1 tablet (20 mg total) by mouth daily. 90 tablet 3  . HYDROcodone-acetaminophen (NORCO) 10-325 MG tablet Take 1 tablet by mouth every 4 (four) hours as needed for moderate pain.   0  . levETIRAcetam (KEPPRA) 750 MG tablet Take 1 tablet (750 mg total) by  mouth 2 (two) times daily. 60 tablet 3  . lidocaine-prilocaine (EMLA) cream Apply 1 application topically as needed. 30 g 0  . lisinopril (PRINIVIL,ZESTRIL) 2.5 MG tablet Take 1 tablet (2.5 mg total) by mouth daily. 30 tablet 12  . nitroGLYCERIN (NITROSTAT) 0.4 MG SL tablet Place 1 tablet (0.4 mg total) under the tongue every 5 (five) minutes x 3 doses as needed for chest pain. 25 tablet 2  . prochlorperazine (COMPAZINE) 10 MG tablet Take 1 tablet (10 mg total) by mouth every 6 (six) hours as needed for nausea or vomiting. 30 tablet 0  . spironolactone (ALDACTONE) 25 MG tablet TAKE 1 TABLET BY MOUTH EVERY DAY 90 tablet 3  . sucralfate (CARAFATE) 1 g tablet Take 1 tablet (1 g total) by mouth 4 (four) times daily -  with meals and at bedtime. 5 min before meals for radiation induced esophagitis 120 tablet 2  . tamsulosin (FLOMAX) 0.4 MG CAPS capsule Take 1 capsule by mouth daily.    . Tiotropium Bromide-Olodaterol (STIOLTO RESPIMAT) 2.5-2.5 MCG/ACT AERS Inhale 2 puffs into the lungs daily. 1 Inhaler 5  . umeclidinium-vilanterol (ANORO ELLIPTA) 62.5-25 MCG/INH AEPB Inhale 1 puff into the lungs daily. 1 each 0   No current facility-administered medications on file prior to visit.     Allergies:  Allergies  Allergen Reactions  . Lyrica [Pregabalin] Palpitations    Chest pain and heart palps.  . Neurontin [Gabapentin] Palpitations    Chest pain and heart palp   Past Medical History:  Past Medical History:  Diagnosis Date  . Acute hepatitis C virus infection 06/19/2007   Qualifier: Diagnosis of  By: Leilani Merl CMA, Tiffany    . Acute ST elevation myocardial infarction (STEMI) involving left anterior descending (LAD) coronary artery ([place]) 05/26/2016  . Acute ST elevation myocardial infarction (STEMI) of lateral wall ([place]) 05/26/2016  . 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 Anxiety   . Asthma   . Atherosclerosis of native arteries of the extremities with intermittent  claudication 12/16/2011  . Chronic hepatitis C without hepatic coma ([place]) 05/03/2014  . COPD with chronic bronchitis and emphysema ([place]) 03/13/2018   FEV1 57%  . Depression   . DVT (deep venous thrombosis) ([place]) 2009; 2019   RLE; LLE  . Encounter for antineoplastic chemotherapy 03/22/2018  . Hepatitis C    "tx'd in 2015"  . High cholesterol   . Ischemic cardiomyopathy 03/02/2018  . Non-small cell carcinoma of right lung, stage 3 ([place]) 03/22/2018  . PAD (peripheral artery disease) ([place]) 01/25/2013  . Peripheral vascular disease, unspecified 07/20/2012  . Port-A-Cath in place 04/24/2018  . ST elevation myocardial infarction involving left anterior descending (LAD) coronary artery ([place])   . STEMI (ST elevation myocardial infarction) ([place]) 05/26/2016  . Tobacco abuse 01/28/2016  . Tubular adenoma of colon 07/11/2014  . Urinary dribbling    Past Surgical History:  Past Surgical  History:  Procedure Laterality Date  . AORTOGRAM  08/08/2009   for LLE claudication     By Dr. Oneida Alar  . 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 BIOPSY N/A 05/30/2014   Procedure: BIOPSY;  Surgeon: [name], MD;  Location: [facility];  Service: Endoscopy;  Laterality: N/A;  . BLADDER TUMOR EXCISION  8/812  . CARDIAC CATHETERIZATION N/A 05/26/2016   Procedure: Left Heart Cath and Coronary Angiography;  Surgeon: [name], MD;  Location: [facility];  Service: Cardiovascular;  Laterality: N/A;  . CARDIAC CATHETERIZATION N/A 05/26/2016   Procedure: Coronary Stent Intervention;  Surgeon: [name], MD;  Location: [facility];  Service: Cardiovascular;  Laterality: N/A;  . COLONOSCOPY WITH PROPOFOL N/A 05/30/2014   ZOX:WRUEAV colonic polyp-likely source of hematochezia-removed as described above  . ESOPHAGOGASTRODUODENOSCOPY (EGD) WITH PROPOFOL N/A 05/30/2014   WUJ:[id number] and bulbar erosions s/p gastric biopsy. No evidence of portal gastropathy on today's examination.  . FEMORAL-TIBIAL BYPASS GRAFT  2009    Right side using non-reversed GSV   By Dr. Oneida Alar  . FEMORAL-TIBIAL BYPASS GRAFT  11/24/2007   Right femoral to anterior tibial BPG   by Dr. Oneida Alar  . FINGER SURGERY Left    "straightened my pinky"  . HAND TENDON SURGERY Left 2013   Left 5th finger  . HERNIA REPAIR     "stomach"  . ICD IMPLANT N/A 03/02/2018   Procedure: ICD IMPLANT;  Surgeon: [name], MD;  Location: [facility];  Service: Cardiovascular;  Laterality: N/A;  . INCISIONAL HERNIA REPAIR N/A 12/25/2014   Procedure: HERNIA REPAIR INCISIONAL WITH MESH;  Surgeon: [name], MD;  Location: [facility];  Service: General;  Laterality: N/A;  . INSERTION OF MESH N/A 12/25/2014   Procedure: INSERTION OF MESH;  Surgeon: [name], MD;  Location: [facility];  Service: General;  Laterality: N/A;  . IR IMAGING GUIDED PORT INSERTION  04/11/2018  . LAPAROSCOPIC CHOLECYSTECTOMY    . LOWER EXTREMITY ANGIOGRAM Left 12/30/2015   Procedure: Lower Extremity Angiogram;  Surgeon: [name], MD;  Location: [facility];  Service: Cardiovascular;  Laterality: Left;  . PERIPHERAL VASCULAR CATHETERIZATION N/A 12/30/2015   Procedure: Abdominal Aortogram;  Surgeon: [name], MD;  Location: [facility];  Service: Cardiovascular;  Laterality: N/A;  . POLYPECTOMY  05/30/2014   Procedure: POLYPECTOMY;  Surgeon: [name], MD;  Location: [facility];  Service: Endoscopy;;  . TONSILLECTOMY AND ADENOIDECTOMY  ~ 1970  . TYMPANOSTOMY TUBE PLACEMENT Bilateral ~ 1970   Social History:  Social History   Socioeconomic History  . Marital status: Married    Spouse name: Not on file  . Number of children: Not on file  . Years of education: Not on file  . Highest education level: Not on file  Occupational History  . Not on file  Social Needs  . Financial resource strain: Not on file  . Food insecurity:    Worry: Not on file    Inability: Not on file  . Transportation needs:    Medical: Not on file     Non-medical: Not on file  Tobacco Use  . Smoking status: Current Every Day Smoker    Packs/day: 0.50    Years: 31.00    Pack years: 15.50    Types: Cigarettes  . Smokeless tobacco: Never Used  . Tobacco comment: half a pack/day now  Substance and Sexual Activity  .  Alcohol use: Yes    Alcohol/week: 18.0 standard drinks    Types: 18 Cans of beer per week    Comment:  history of heavy ETOH use; 03/02/2018 "6-12 beers Fri & Sat"  . Drug use: Yes    Types: Marijuana    Comment: 03/02/2018 "weekly"  . Sexual activity: Not Currently  Lifestyle  . Physical activity:    Days per week: Not on file    Minutes per session: Not on file  . Stress: Not on file  Relationships  . Social connections:    Talks on phone: Not on file    Gets together: Not on file    Attends religious service: Not on file    Active member of club or organization: Not on file    Attends meetings of clubs or organizations: Not on file    Relationship status: Not on file  . Intimate partner violence:    Fear of current or ex partner: Not on file    Emotionally abused: Not on file    Physically abused: Not on file    Forced sexual activity: Not on file  Other Topics Concern  . Not on file  Social History Narrative  . Not on file   Family History:  Family History  Problem Relation Age of Onset  . Vision loss Mother   . Ulcers Father   . Colon cancer Neg Hx     Review of Systems: Constitutional: Denies fevers, chills or abnormal weight loss Eyes: Denies blurriness of vision Ears, nose, mouth, throat, and face: Denies mucositis or sore throat Respiratory: Denies cough, dyspnea or wheezes Cardiovascular: Denies palpitation, chest discomfort or lower extremity swelling Gastrointestinal:  Denies nausea, constipation, diarrhea GU: Denies dysuria or incontinence Skin: Denies abnormal skin rashes Neurological: Per HPI Musculoskeletal: Denies joint pain, back or neck discomfort. No decrease in  ROM Behavioral/Psych: Denies anxiety, disturbance in thought content, and mood instability   Physical Exam: Vitals:   07/21/18 1207  BP: 97/72  Pulse: 75  Resp: 18  Temp: 97.8 F (36.6 C)  SpO2: 99%   KPS: 70. General: Alert, cooperative, pleasant, in no acute distress in wheelchair Head: Craniotomy scar noted, dry and intact. EENT: No conjunctival injection or scleral icterus. Oral mucosa moist Lungs: Resp effort normal Cardiac: Regular rate and rhythm Abdomen: Soft, non-distended abdomen Skin: No rashes cyanosis or petechiae. Extremities: Right BKA  Neurologic Exam: Mental Status: Awake, alert, attentive to examiner. Oriented to self and environment. Language is fluent with intact comprehension.  Cranial Nerves: Visual acuity is grossly normal. Visual fields are full. Extra-ocular movements intact. No ptosis. Face is symmetric, tongue midline. Motor: Tone and bulk are normal. Power is full in both arms and legs. Reflexes are symmetric, no pathologic reflexes present. Intact finger to nose bilaterally Sensory: Intact to light touch and temperature Gait: Deferred  Labs: I have reviewed the data as listed    Component Value Date/Time   NA 139 07/18/2018 1344   NA 139 02/27/2018 1457   K 4.1 07/18/2018 1344   CL 99 07/18/2018 1344   CO2 30 07/18/2018 1344   GLUCOSE 90 07/18/2018 1344   BUN 11 07/18/2018 1344   BUN 7 02/27/2018 1457   CREATININE 0.96 07/18/2018 1344   CREATININE 1.13 06/28/2016 1453   CALCIUM 9.4 07/18/2018 1344   PROT 7.2 07/18/2018 1344   PROT 6.5 01/31/2018 1124   ALBUMIN 3.9 07/18/2018 1344   ALBUMIN 4.4 01/31/2018 1124   AST 47 (H) 07/18/2018 1344  ALT 55 (H) 07/18/2018 1344   ALKPHOS 126 07/18/2018 1344   BILITOT 0.4 07/18/2018 1344   GFRNONAA >60 07/18/2018 1344   GFRNONAA 74 06/28/2016 1453   GFRAA >60 07/18/2018 1344   GFRAA 85 06/28/2016 1453   Lab Results  Component Value Date   WBC 6.3 07/18/2018   NEUTROABS 4.5 07/18/2018    HGB 13.4 07/18/2018   HCT 40.9 07/18/2018   MCV 100.5 (H) 07/18/2018   PLT 168 07/18/2018    Imaging:  Ct Head W Wo Contrast  Result Date: 06/28/2018 CLINICAL DATA:  Seizure like activity. History of lung cancer status post chemo radiation. EXAM: CT HEAD WITHOUT AND WITH CONTRAST TECHNIQUE: Contiguous axial images were obtained from the base of the skull through the vertex without and with intravenous contrast CONTRAST:  56mL OMNIPAQUE IOHEXOL 300 MG/ML  SOLN COMPARISON:  03/31/2018 FINDINGS: Brain: There is no evidence of acute infarct, intracranial hemorrhage, mass, midline shift, or extra-axial fluid collection. The ventricles and sulci are normal. No abnormal enhancement is identified. Vascular: No hyperdense vessel on noncontrast imaging. Major dural venous sinuses are grossly patent. Skull: No fracture focal osseous lesion. Sinuses/Orbits: Partially visualized right maxillary sinus mucous retention cyst. No significant mastoid fluid. Unremarkable orbits. Other: Unchanged subcentimeter calcification in the right parietal scalp IMPRESSION: Unremarkable CT appearance of the brain. No evidence of metastatic disease or acute abnormality. Electronically Signed   By: Logan Bores M.D.   On: 06/28/2018 08:34     Assessment/Plan 1. Seizures [facility])  Mr. Spease is clinically stable today.    We recommended continuing Keppra 750mg  BID.    For MRI- his ICD and stents are labeled as MRI compliant according to information provided by the patient which was cross-referenced on [employer]'s website.  Case/order will need to be reviewed and cleared by radiology.    We appreciate the opportunity to participate in the care of [name].  We will contact him by phone to relay MRI results.  All questions were answered. The patient knows to call the clinic with any problems, questions or concerns. No barriers to learning were detected.  The total time spent in the encounter was 25 minutes and more than 50% was  on counseling and review of test results   Ventura Sellers, MD Medical Director of Neuro-Oncology [facility] [place] 07/21/18 12:18 PM

## 2018-07-27 ENCOUNTER — Other Ambulatory Visit (HOSPITAL_COMMUNITY): Payer: Medicare Other

## 2018-07-27 ENCOUNTER — Ambulatory Visit (HOSPITAL_COMMUNITY)
Admission: RE | Admit: 2018-07-27 | Discharge: 2018-07-27 | Disposition: A | Discharge status: Home / Self Care | Payer: Medicare Other | Source: Ambulatory Visit | Attending: Internal Medicine | Admitting: Internal Medicine

## 2018-07-27 DIAGNOSIS — C3490 Malignant neoplasm of unspecified part of unspecified bronchus or lung: Secondary | ICD-10-CM | POA: Diagnosis not present

## 2018-07-27 DIAGNOSIS — R4020 Unspecified coma: Secondary | ICD-10-CM | POA: Diagnosis not present

## 2018-07-27 DIAGNOSIS — R569 Unspecified convulsions: Secondary | ICD-10-CM

## 2018-07-27 MED ORDER — GADOBUTROL 1 MMOL/ML IV SOLN
10.0000 mL | Freq: Once | INTRAVENOUS | Status: AC | PRN
  Administered 2018-07-27: 10 mL via INTRAVENOUS

## 2018-08-01 ENCOUNTER — Encounter: Payer: Self-pay | Admitting: Oncology

## 2018-08-01 ENCOUNTER — Inpatient Hospital Stay (HOSPITAL_BASED_OUTPATIENT_CLINIC_OR_DEPARTMENT_OTHER): Payer: Medicare Other | Admitting: Oncology

## 2018-08-01 ENCOUNTER — Inpatient Hospital Stay: Payer: Medicare Other

## 2018-08-01 ENCOUNTER — Inpatient Hospital Stay: Payer: Medicare Other | Attending: Internal Medicine

## 2018-08-01 ENCOUNTER — Telehealth: Payer: Self-pay | Admitting: Oncology

## 2018-08-01 VITALS — BP 98/62 | HR 76 | Temp 97.9°F | Resp 17 | Ht 72.0 in | Wt 231.0 lb

## 2018-08-01 DIAGNOSIS — C3411 Malignant neoplasm of upper lobe, right bronchus or lung: Secondary | ICD-10-CM | POA: Diagnosis not present

## 2018-08-01 DIAGNOSIS — C3491 Malignant neoplasm of unspecified part of right bronchus or lung: Secondary | ICD-10-CM

## 2018-08-01 DIAGNOSIS — R569 Unspecified convulsions: Secondary | ICD-10-CM

## 2018-08-01 DIAGNOSIS — Z5112 Encounter for antineoplastic immunotherapy: Secondary | ICD-10-CM | POA: Insufficient documentation

## 2018-08-01 DIAGNOSIS — Z79899 Other long term (current) drug therapy: Secondary | ICD-10-CM | POA: Diagnosis not present

## 2018-08-01 DIAGNOSIS — Z95828 Presence of other vascular implants and grafts: Secondary | ICD-10-CM

## 2018-08-01 LAB — CBC WITH DIFFERENTIAL (CANCER CENTER ONLY)
ABS IMMATURE GRANULOCYTES: 0.03 10*3/uL (ref 0.00–0.07)
BASOS ABS: 0 10*3/uL (ref 0.0–0.1)
Basophils Relative: 1 %
Eosinophils Absolute: 0.2 10*3/uL (ref 0.0–0.5)
Eosinophils Relative: 4 %
HCT: 39.1 % (ref 39.0–52.0)
Hemoglobin: 12.7 g/dL — ABNORMAL LOW (ref 13.0–17.0)
Immature Granulocytes: 1 %
LYMPHS ABS: 0.6 10*3/uL — AB (ref 0.7–4.0)
LYMPHS PCT: 12 %
MCH: 32.5 pg (ref 26.0–34.0)
MCHC: 32.5 g/dL (ref 30.0–36.0)
MCV: 100 fL (ref 80.0–100.0)
Monocytes Absolute: 0.6 10*3/uL (ref 0.1–1.0)
Monocytes Relative: 11 %
NEUTROS ABS: 3.6 10*3/uL (ref 1.7–7.7)
NRBC: 0 % (ref 0.0–0.2)
Neutrophils Relative %: 71 %
Platelet Count: 163 10*3/uL (ref 150–400)
RBC: 3.91 MIL/uL — AB (ref 4.22–5.81)
RDW: 13 % (ref 11.5–15.5)
WBC: 5 10*3/uL (ref 4.0–10.5)

## 2018-08-01 LAB — CMP (CANCER CENTER ONLY)
ALBUMIN: 3.7 g/dL (ref 3.5–5.0)
ALK PHOS: 136 U/L — AB (ref 38–126)
ALT: 26 U/L (ref 0–44)
AST: 19 U/L (ref 15–41)
Anion gap: 5 (ref 5–15)
BUN: 10 mg/dL (ref 6–20)
CALCIUM: 9.2 mg/dL (ref 8.9–10.3)
CHLORIDE: 100 mmol/L (ref 98–111)
CO2: 30 mmol/L (ref 22–32)
CREATININE: 0.86 mg/dL (ref 0.61–1.24)
GFR, Estimated: >60 mL/min (ref >60)
GLUCOSE: 91 mg/dL (ref 70–99)
Potassium: 4.4 mmol/L (ref 3.5–5.1)
SODIUM: 135 mmol/L (ref 135–145)
Total Bilirubin: 0.3 mg/dL (ref 0.3–1.2)
Total Protein: 7 g/dL (ref 6.5–8.1)

## 2018-08-01 MED ORDER — SODIUM CHLORIDE 0.9 % IV SOLN
9.7000 mg/kg | Freq: Once | INTRAVENOUS | Status: AC
  Administered 2018-08-01: 1000 mg via INTRAVENOUS
  Filled 2018-08-01: qty 20

## 2018-08-01 MED ORDER — SODIUM CHLORIDE 0.9 % IV SOLN
Freq: Once | INTRAVENOUS | Status: AC
  Administered 2018-08-01: 15:00:00 via INTRAVENOUS
  Filled 2018-08-01: qty 250

## 2018-08-01 MED ORDER — SODIUM CHLORIDE 0.9% FLUSH
10.0000 mL | INTRAVENOUS | Status: DC | PRN
  Administered 2018-08-01: 10 mL
  Filled 2018-08-01: qty 10

## 2018-08-01 MED ORDER — SODIUM CHLORIDE 0.9% FLUSH
10.0000 mL | Freq: Once | INTRAVENOUS | Status: AC
  Administered 2018-08-01: 10 mL
  Filled 2018-08-01: qty 10

## 2018-08-01 MED ORDER — HEPARIN SOD (PORK) LOCK FLUSH 100 UNIT/ML IV SOLN
500.0000 [IU] | Freq: Once | INTRAVENOUS | Status: AC | PRN
  Administered 2018-08-01: 500 [IU]
  Filled 2018-08-01: qty 5

## 2018-08-01 NOTE — Progress Notes (Signed)
[facility] OFFICE PROGRESS NOTE  Redmond School, MD [place] [address]  DIAGNOSIS:stage IIIA (T3, N2, M0) non-small cell lung cancer, adenosquamous carcinoma diagnosed in June 2019 and presented with large right upper lobe lung mass with questionable chest wall invasion as well as right hilar and mediastinal lymphadenopathy.  Biomarker Findings Tumor Mutational Burden - TMB-High (21 Muts/Mb) Microsatellite status - MS-Stable Genomic Findings For a complete list of the genes assayed, please refer to the Appendix. ATM U0454* CCND2 P281R KRAS G12V CHEK2 T347f*15 TP53 E298* 7 Disease relevant genes with no reportable alterations: ALK, EGFR, BRAF, MET, RET, ERBB2, ROS1  PRIOR THERAPY:Concurrent chemoradiation with chemotherapy consisting of weekly carboplatin for an AUC of 2 and paclitaxel 45 mg/m2. First dose given on 04/03/2018.Status post6cycles.Last dose was giving 05/15/2018 with partial response.  CURRENT THERAPY:Consolidation treatment with immunotherapy with Imfinzi (Durvalumab) 10 mg/KG every 2 weeks. First dose 06/20/2018.Status post 3 cycles.  INTERVAL HISTORY: Mark WICKLIFF57y.o. male returns for routine follow-up visit accompanied by his wife.  The patient is feeling fine today and has no specific complaints.  He denies fevers and chills.  Denies chest pain, shortness of breath, cough, hemoptysis.  Denies nausea, vomiting, constipation, diarrhea.  Denies recent weight loss or night sweats.  He continues on Keppra for seizures.  Denies any recent seizure-like activity.  He is tolerating treatment with Imfinzi fairly well.  The patient is here for evaluation prior to cycle #4 of Imfinzi.  MEDICAL HISTORY: Past Medical History:  Diagnosis Date  . Acute hepatitis C virus infection 06/19/2007   Qualifier: Diagnosis of  By: MLeilani MerlCMA, Tiffany    . Acute ST elevation myocardial infarction (STEMI) involving left anterior descending  (LAD) coronary artery ([facility] 05/26/2016  . Acute ST elevation myocardial infarction (STEMI) of lateral wall ([facility] 05/26/2016  . 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 Anxiety   . Asthma   . Atherosclerosis of native arteries of the extremities with intermittent claudication 12/16/2011  . Chronic hepatitis C without hepatic coma ([facility] 05/03/2014  . COPD with chronic bronchitis and emphysema ([facility] 03/13/2018   FEV1 57%  . Depression   . DVT (deep venous thrombosis) ([facility] 2009; 2019   RLE; LLE  . Encounter for antineoplastic chemotherapy 03/22/2018  . Hepatitis C    "tx'd in 2015"  . High cholesterol   . Ischemic cardiomyopathy 03/02/2018  . Non-small cell carcinoma of right lung, stage 3 ([facility] 03/22/2018  . PAD (peripheral artery disease) ([facility] 01/25/2013  . Peripheral vascular disease, unspecified 07/20/2012  . Port-A-Cath in place 04/24/2018  . ST elevation myocardial infarction involving left anterior descending (LAD) coronary artery ([facility]   . STEMI (ST elevation myocardial infarction) ([facility] 05/26/2016  . Tobacco abuse 01/28/2016  . Tubular adenoma of colon 07/11/2014  . Urinary dribbling     ALLERGIES:  is allergic to lyrica [pregabalin] and neurontin [gabapentin].  MEDICATIONS:  Current Outpatient Medications  Medication Sig Dispense Refill  . alprazolam (XANAX) 2 MG tablet Take 2 mg by mouth 4 (four) times daily as needed for anxiety.     .Marland Kitchenamitriptyline (ELAVIL) 100 MG tablet Take 100 mg by mouth at bedtime.     .Marland Kitchenaspirin 81 MG EC tablet Take 81 mg by mouth daily.      .Marland Kitchenatorvastatin (LIPITOR) 80 MG tablet Take 1 tablet (80 mg total) by mouth daily at 6 PM. 30 tablet 12  . carvedilol (COREG) 6.25 MG tablet TAKE 1 TABLET BY MOUTH  TWICE A DAY WITH A MEAL 60 tablet 6  . citalopram (CELEXA) 40 MG tablet Take 40 mg by mouth daily.  11  . furosemide (LASIX) 20 MG tablet Take 1 tablet (20 mg total) by mouth daily. 90 tablet 3  . HYDROcodone-acetaminophen (NORCO) 10-325  MG tablet Take 1 tablet by mouth every 4 (four) hours as needed for moderate pain.   0  . levETIRAcetam (KEPPRA) 750 MG tablet Take 1 tablet (750 mg total) by mouth 2 (two) times daily. 60 tablet 3  . lidocaine-prilocaine (EMLA) cream Apply 1 application topically as needed. 30 g 0  . lisinopril (PRINIVIL,ZESTRIL) 2.5 MG tablet Take 1 tablet (2.5 mg total) by mouth daily. 30 tablet 12  . nitroGLYCERIN (NITROSTAT) 0.4 MG SL tablet Place 1 tablet (0.4 mg total) under the tongue every 5 (five) minutes x 3 doses as needed for chest pain. 25 tablet 2  . prochlorperazine (COMPAZINE) 10 MG tablet Take 1 tablet (10 mg total) by mouth every 6 (six) hours as needed for nausea or vomiting. 30 tablet 0  . spironolactone (ALDACTONE) 25 MG tablet TAKE 1 TABLET BY MOUTH EVERY DAY 90 tablet 3  . sucralfate (CARAFATE) 1 g tablet Take 1 tablet (1 g total) by mouth 4 (four) times daily -  with meals and at bedtime. 5 min before meals for radiation induced esophagitis 120 tablet 2  . tamsulosin (FLOMAX) 0.4 MG CAPS capsule Take 1 capsule by mouth daily.    . Tiotropium Bromide-Olodaterol (STIOLTO RESPIMAT) 2.5-2.5 MCG/ACT AERS Inhale 2 puffs into the lungs daily. 1 Inhaler 5  . umeclidinium-vilanterol (ANORO ELLIPTA) 62.5-25 MCG/INH AEPB Inhale 1 puff into the lungs daily. 1 each 0   No current facility-administered medications for this visit.    Facility-Administered Medications Ordered in Other Visits  Medication Dose Route Frequency Provider Last Rate Last Dose  . durvalumab (IMFINZI) 1,000 mg in sodium chloride 0.9 % 100 mL chemo infusion  9.7 mg/kg (Treatment Plan Recorded) Intravenous Once Curt Bears, MD 132 mL/hr at 08/01/18 1553    . heparin lock flush 100 unit/mL  500 Units Intracatheter Once PRN Curt Bears, MD      . sodium chloride flush (NS) 0.9 % injection 10 mL  10 mL Intracatheter PRN Curt Bears, MD        SURGICAL HISTORY:  Past Surgical History:  Procedure Laterality Date  .  AORTOGRAM  08/08/2009   for LLE claudication     By Dr. Oneida Alar  . 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 BIOPSY N/A 05/30/2014   Procedure: BIOPSY;  Surgeon: [name], MD;  Location: [facility];  Service: Endoscopy;  Laterality: N/A;  . BLADDER TUMOR EXCISION  8/812  . CARDIAC CATHETERIZATION N/A 05/26/2016   Procedure: Left Heart Cath and Coronary Angiography;  Surgeon: [name], MD;  Location: [facility];  Service: Cardiovascular;  Laterality: N/A;  . CARDIAC CATHETERIZATION N/A 05/26/2016   Procedure: Coronary Stent Intervention;  Surgeon: [name], MD;  Location: [facility];  Service: Cardiovascular;  Laterality: N/A;  . COLONOSCOPY WITH PROPOFOL N/A 05/30/2014   ZPH:XTAVWP colonic polyp-likely source of hematochezia-removed as described above  . ESOPHAGOGASTRODUODENOSCOPY (EGD) WITH PROPOFOL N/A 05/30/2014   VXY:IAXKPV and bulbar erosions s/p gastric biopsy. No evidence of portal gastropathy on today's examination.  . FEMORAL-TIBIAL BYPASS GRAFT  2009   Right side using non-reversed GSV   By Dr. Oneida Alar  . FEMORAL-TIBIAL BYPASS GRAFT  11/24/2007   Right  femoral to anterior tibial BPG   by Dr. Oneida Alar  . FINGER SURGERY Left    "straightened my pinky"  . HAND TENDON SURGERY Left 2013   Left 5th finger  . HERNIA REPAIR     "stomach"  . ICD IMPLANT N/A 03/02/2018   Procedure: ICD IMPLANT;  Surgeon: [name], MD;  Location: [facility];  Service: Cardiovascular;  Laterality: N/A;  . INCISIONAL HERNIA REPAIR N/A 12/25/2014   Procedure: HERNIA REPAIR INCISIONAL WITH MESH;  Surgeon: [name], MD;  Location: [facility];  Service: General;  Laterality: N/A;  . INSERTION OF MESH N/A 12/25/2014   Procedure: INSERTION OF MESH;  Surgeon: [name], MD;  Location: [facility];  Service: General;  Laterality: N/A;  . IR IMAGING GUIDED PORT INSERTION  04/11/2018  . LAPAROSCOPIC CHOLECYSTECTOMY    . LOWER EXTREMITY ANGIOGRAM Left 12/30/2015   Procedure:  Lower Extremity Angiogram;  Surgeon: [name], MD;  Location: [facility];  Service: Cardiovascular;  Laterality: Left;  . PERIPHERAL VASCULAR CATHETERIZATION N/A 12/30/2015   Procedure: Abdominal Aortogram;  Surgeon: [name], MD;  Location: [facility];  Service: Cardiovascular;  Laterality: N/A;  . POLYPECTOMY  05/30/2014   Procedure: POLYPECTOMY;  Surgeon: [name], MD;  Location: [facility];  Service: Endoscopy;;  . TONSILLECTOMY AND ADENOIDECTOMY  ~ 1970  . TYMPANOSTOMY TUBE PLACEMENT Bilateral ~ 1970    REVIEW OF SYSTEMS:   Review of Systems  Constitutional: Negative for appetite change, chills, fatigue, fever and unexpected weight change.  HENT:   Negative for mouth sores, nosebleeds, sore throat and trouble swallowing.   Eyes: Negative for eye problems and icterus.  Respiratory: Negative for cough, hemoptysis, shortness of breath and wheezing.   Cardiovascular: Negative for chest pain and leg swelling.  Gastrointestinal: Negative for abdominal pain, constipation, diarrhea, nausea and vomiting.  Genitourinary: Negative for bladder incontinence, difficulty urinating, dysuria, frequency and hematuria.   Musculoskeletal: Negative for back pain, neck pain and neck stiffness.  Skin: Negative for itching and rash.  Neurological: Negative for dizziness, extremity weakness, headaches, light-headedness and seizures.  Hematological: Negative for adenopathy. Does not bruise/bleed easily.  Psychiatric/Behavioral: Negative for confusion, depression and sleep disturbance. The patient is not nervous/anxious.     PHYSICAL EXAMINATION:  Blood pressure 98/62, pulse 76, temperature 97.9 F (36.6 C), temperature source Oral, resp. rate 17, height 6' (1.829 m), weight 231 lb (104.8 kg), SpO2 99 %.  ECOG PERFORMANCE STATUS: 1 - Symptomatic but completely ambulatory  Physical Exam  Constitutional: Oriented to person, place, and time and well-developed, well-nourished, and  in no distress. No distress.  HENT:  Head: Normocephalic and atraumatic.  Mouth/Throat: Oropharynx is clear and moist. No oropharyngeal exudate.  Eyes: Conjunctivae are normal. Right eye exhibits no discharge. Left eye exhibits no discharge. No scleral icterus.  Neck: Normal range of motion. Neck supple.  Cardiovascular: Normal rate, regular rhythm, normal heart sounds and intact distal pulses.   Pulmonary/Chest: Effort normal and breath sounds normal. No respiratory distress. No wheezes. No rales.  Abdominal: Soft. Bowel sounds are normal. Exhibits no distension and no mass. There is no tenderness.  Musculoskeletal: Normal range of motion. Exhibits no edema.  Right leg amputation. Lymphadenopathy:    No cervical adenopathy.  Neurological: Alert and oriented to person, place, and time. Exhibits normal muscle tone. Coordination normal.  Skin: Skin is warm and dry. No rash noted. Not diaphoretic. No erythema. No pallor.  Psychiatric: Mood,  memory and judgment normal.  Vitals reviewed.  LABORATORY DATA: Lab Results  Component Value Date   WBC 5.0 08/01/2018   HGB 12.7 (L) 08/01/2018   HCT 39.1 08/01/2018   MCV 100.0 08/01/2018   PLT 163 08/01/2018      Chemistry      Component Value Date/Time   NA 135 08/01/2018 1255   NA 139 02/27/2018 1457   K 4.4 08/01/2018 1255   CL 100 08/01/2018 1255   CO2 30 08/01/2018 1255   BUN 10 08/01/2018 1255   BUN 7 02/27/2018 1457   CREATININE 0.86 08/01/2018 1255   CREATININE 1.13 06/28/2016 1453      Component Value Date/Time   CALCIUM 9.2 08/01/2018 1255   ALKPHOS 136 (H) 08/01/2018 1255   AST 19 08/01/2018 1255   ALT 26 08/01/2018 1255   BILITOT 0.3 08/01/2018 1255       RADIOGRAPHIC STUDIES:  Mr Jeri Cos HD Contrast  Result Date: 07/27/2018 CLINICAL DATA:  Recent episodes of loss of consciousness. History of lung cancer. EXAM: MRI HEAD WITHOUT AND WITH CONTRAST TECHNIQUE: Multiplanar, multiecho pulse sequences of the brain and  surrounding structures were obtained without and with intravenous contrast. CONTRAST:  10 mL Gadavist COMPARISON:  Head CT 06/28/2018 FINDINGS: BRAIN: There is no acute infarct, acute hemorrhage, hydrocephalus or extra-axial collection. The midline structures are normal. No midline shift or other mass effect. There are no old infarcts. Minimal white matter hyperintensity, nonspecific and commonly seen in asymptomatic patients of this age. The cerebral and cerebellar volume are age-appropriate. Susceptibility-sensitive sequences show no chronic microhemorrhage or superficial siderosis. No abnormal contrast enhancement. VASCULAR: Major intracranial arterial and venous sinus flow voids are normal. SKULL AND UPPER CERVICAL SPINE: Calvarial bone marrow signal is normal. There is no skull base mass. Visualized upper cervical spine and soft tissues are normal. SINUSES/ORBITS: Bilateral maxillary sinus retention cysts. Small amount of bilateral mastoid fluid. The orbits are normal. IMPRESSION: Normal MRI of the brain. Electronically Signed   By: Ulyses Jarred M.D.   On: 07/27/2018 16:42     ASSESSMENT/PLAN:  Non-small cell carcinoma of right lung, stage 3 (HCC) This is a very pleasant 58 year old white male recently diagnosed with a stage IIIa non-small cell lung cancer, adenosquamous carcinoma. He underwenta course of concurrent chemoradiation with weekly carboplatin and paclitaxel status post 6cycles. The patient tolerated this course of concurrent chemoradiation fairly well except for mild odynophagia and dysphagia.The CT scan following treatmentshowed improvement of his disease with partial response. The patient is currently onconsolidation treatment with immunotherapy with Imfinzi (Durvalumab) 10 mg/KG every 2 weeks.Status post 3 cycles which he is tolerating fairly well with no concerning complaints.  Recommend for him to proceed with cycle 4 today as scheduled.  He will follow-up in 2 weeks for  evaluation prior to cycle #5.  He will continue to follow-up with Dr. Mickeal Skinner for management of his seizures.  He was advised to call immediately if he has any concerning symptoms in the interval. The patient voices understanding of current disease status and treatment options and is in agreement with the current care plan.  All questions were answered. The patient knows to call the clinic with any problems, questions or concerns. We can certainly see the patient much sooner if necessary.   No orders of the defined types were placed in this encounter.    Mikey Bussing, DNP, AGPCNP-BC, AOCNP 08/01/18

## 2018-08-01 NOTE — Patient Instructions (Addendum)
[facility] Discharge Instructions for Patients Receiving Chemotherapy  Today you received the following immunotherapy agent Imfinzi  To help prevent nausea and vomiting after your treatment, we encourage you to take your nausea medication as prescribed.  If you develop nausea and vomiting that is not controlled by your nausea medication, call the clinic.   BELOW ARE SYMPTOMS THAT SHOULD BE REPORTED IMMEDIATELY:  *FEVER GREATER THAN 100.5 F  *CHILLS WITH OR WITHOUT FEVER  NAUSEA AND VOMITING THAT IS NOT CONTROLLED WITH YOUR NAUSEA MEDICATION  *UNUSUAL SHORTNESS OF BREATH  *UNUSUAL BRUISING OR BLEEDING  TENDERNESS IN MOUTH AND THROAT WITH OR WITHOUT PRESENCE OF ULCERS  *URINARY PROBLEMS  *BOWEL PROBLEMS  UNUSUAL RASH Items with * indicate a potential emergency and should be followed up as soon as possible.  Feel free to call the clinic should you have any questions or concerns. The clinic phone number is [phone].  Please show the [facility] at check-in to the Emergency Department and triage nurse.

## 2018-08-01 NOTE — Telephone Encounter (Signed)
3 cycles already schedled per 11/5 los - appt added for 11/5 sch message - pt to get an updated schedule next visit.

## 2018-08-02 NOTE — Assessment & Plan Note (Signed)
This is a very pleasant 57 year old white male recently diagnosed with a stage IIIa non-small cell lung cancer, adenosquamous carcinoma. He underwenta course of concurrent chemoradiation with weekly carboplatin and paclitaxel status post 6cycles. The patient tolerated this course of concurrent chemoradiation fairly well except for mild odynophagia and dysphagia.The CT scan following treatmentshowed improvement of his disease with partial response. The patient is currently onconsolidation treatment with immunotherapy with Imfinzi (Durvalumab) 10 mg/KG every 2 weeks.Status post 3 cycles which he is tolerating fairly well with no concerning complaints.  Recommend for him to proceed with cycle 4 today as scheduled.  He will follow-up in 2 weeks for evaluation prior to cycle #5.  He will continue to follow-up with Dr. Mickeal Skinner for management of his seizures.  He was advised to call immediately if he has any concerning symptoms in the interval. The patient voices understanding of current disease status and treatment options and is in agreement with the current care plan.  All questions were answered. The patient knows to call the clinic with any problems, questions or concerns. We can certainly see the patient much sooner if necessary.

## 2018-08-03 ENCOUNTER — Ambulatory Visit: Payer: Medicare Other | Admitting: Cardiovascular Disease

## 2018-08-15 ENCOUNTER — Inpatient Hospital Stay: Payer: Medicare Other

## 2018-08-15 ENCOUNTER — Inpatient Hospital Stay (HOSPITAL_BASED_OUTPATIENT_CLINIC_OR_DEPARTMENT_OTHER): Payer: Medicare Other | Admitting: Nurse Practitioner

## 2018-08-15 ENCOUNTER — Encounter: Payer: Self-pay | Admitting: Nurse Practitioner

## 2018-08-15 VITALS — BP 109/71 | HR 85 | Temp 98.0°F | Resp 18 | Ht 72.0 in | Wt 230.8 lb

## 2018-08-15 DIAGNOSIS — R0609 Other forms of dyspnea: Secondary | ICD-10-CM | POA: Diagnosis not present

## 2018-08-15 DIAGNOSIS — C3491 Malignant neoplasm of unspecified part of right bronchus or lung: Secondary | ICD-10-CM

## 2018-08-15 DIAGNOSIS — Z720 Tobacco use: Secondary | ICD-10-CM

## 2018-08-15 DIAGNOSIS — Z5112 Encounter for antineoplastic immunotherapy: Secondary | ICD-10-CM | POA: Diagnosis not present

## 2018-08-15 DIAGNOSIS — C3411 Malignant neoplasm of upper lobe, right bronchus or lung: Secondary | ICD-10-CM

## 2018-08-15 DIAGNOSIS — Z79899 Other long term (current) drug therapy: Secondary | ICD-10-CM | POA: Diagnosis not present

## 2018-08-15 DIAGNOSIS — K5900 Constipation, unspecified: Secondary | ICD-10-CM

## 2018-08-15 DIAGNOSIS — Z95828 Presence of other vascular implants and grafts: Secondary | ICD-10-CM

## 2018-08-15 DIAGNOSIS — R569 Unspecified convulsions: Secondary | ICD-10-CM | POA: Diagnosis not present

## 2018-08-15 LAB — CBC WITH DIFFERENTIAL (CANCER CENTER ONLY)
ABS IMMATURE GRANULOCYTES: 0.03 10*3/uL (ref 0.00–0.07)
BASOS PCT: 1 %
Basophils Absolute: 0 10*3/uL (ref 0.0–0.1)
EOS PCT: 5 %
Eosinophils Absolute: 0.3 10*3/uL (ref 0.0–0.5)
HCT: 42.1 % (ref 39.0–52.0)
HEMOGLOBIN: 13.5 g/dL (ref 13.0–17.0)
Immature Granulocytes: 1 %
Lymphocytes Relative: 12 %
Lymphs Abs: 0.7 10*3/uL (ref 0.7–4.0)
MCH: 31.5 pg (ref 26.0–34.0)
MCHC: 32.1 g/dL (ref 30.0–36.0)
MCV: 98.1 fL (ref 80.0–100.0)
MONOS PCT: 10 %
Monocytes Absolute: 0.5 10*3/uL (ref 0.1–1.0)
NEUTROS ABS: 3.9 10*3/uL (ref 1.7–7.7)
Neutrophils Relative %: 71 %
PLATELETS: 164 10*3/uL (ref 150–400)
RBC: 4.29 MIL/uL (ref 4.22–5.81)
RDW: 12.7 % (ref 11.5–15.5)
WBC Count: 5.4 10*3/uL (ref 4.0–10.5)
nRBC: 0 % (ref 0.0–0.2)

## 2018-08-15 LAB — CMP (CANCER CENTER ONLY)
ALBUMIN: 3.8 g/dL (ref 3.5–5.0)
ALK PHOS: 137 U/L — AB (ref 38–126)
ALT: 31 U/L (ref 0–44)
AST: 21 U/L (ref 15–41)
Anion gap: 7 (ref 5–15)
BILIRUBIN TOTAL: 0.3 mg/dL (ref 0.3–1.2)
BUN: 7 mg/dL (ref 6–20)
CO2: 30 mmol/L (ref 22–32)
CREATININE: 0.89 mg/dL (ref 0.61–1.24)
Calcium: 9.3 mg/dL (ref 8.9–10.3)
Chloride: 100 mmol/L (ref 98–111)
GFR, Est AFR Am: >60 mL/min (ref >60)
GLUCOSE: 73 mg/dL (ref 70–99)
POTASSIUM: 4.5 mmol/L (ref 3.5–5.1)
Sodium: 137 mmol/L (ref 135–145)
TOTAL PROTEIN: 7.1 g/dL (ref 6.5–8.1)

## 2018-08-15 LAB — TSH: TSH: <0.08 u[IU]/mL — ABNORMAL LOW (ref 0.320–4.118)

## 2018-08-15 MED ORDER — SODIUM CHLORIDE 0.9% FLUSH
10.0000 mL | Freq: Once | INTRAVENOUS | Status: AC
  Administered 2018-08-15: 10 mL
  Filled 2018-08-15: qty 10

## 2018-08-15 MED ORDER — HEPARIN SOD (PORK) LOCK FLUSH 100 UNIT/ML IV SOLN
500.0000 [IU] | Freq: Once | INTRAVENOUS | Status: AC | PRN
  Administered 2018-08-15: 500 [IU]
  Filled 2018-08-15: qty 5

## 2018-08-15 MED ORDER — SODIUM CHLORIDE 0.9 % IV SOLN
Freq: Once | INTRAVENOUS | Status: AC
  Administered 2018-08-15: 15:00:00 via INTRAVENOUS
  Filled 2018-08-15: qty 250

## 2018-08-15 MED ORDER — SODIUM CHLORIDE 0.9% FLUSH
10.0000 mL | INTRAVENOUS | Status: DC | PRN
  Administered 2018-08-15: 10 mL
  Filled 2018-08-15: qty 10

## 2018-08-15 MED ORDER — SODIUM CHLORIDE 0.9 % IV SOLN
1000.0000 mg | Freq: Once | INTRAVENOUS | Status: AC
  Administered 2018-08-15: 1000 mg via INTRAVENOUS
  Filled 2018-08-15: qty 20

## 2018-08-15 NOTE — Patient Instructions (Signed)
[facility] Discharge Instructions for Patients Receiving Chemotherapy  Today you received the following chemotherapy agents: Imfinzi.  To help prevent nausea and vomiting after your treatment, we encourage you to take your nausea medication as directed.   If you develop nausea and vomiting that is not controlled by your nausea medication, call the clinic.   BELOW ARE SYMPTOMS THAT SHOULD BE REPORTED IMMEDIATELY:  *FEVER GREATER THAN 100.5 F  *CHILLS WITH OR WITHOUT FEVER  NAUSEA AND VOMITING THAT IS NOT CONTROLLED WITH YOUR NAUSEA MEDICATION  *UNUSUAL SHORTNESS OF BREATH  *UNUSUAL BRUISING OR BLEEDING  TENDERNESS IN MOUTH AND THROAT WITH OR WITHOUT PRESENCE OF ULCERS  *URINARY PROBLEMS  *BOWEL PROBLEMS  UNUSUAL RASH Items with * indicate a potential emergency and should be followed up as soon as possible.  Feel free to call the clinic should you have any questions or concerns. The clinic phone number is [phone].  Please show the CHEMO ALERT CARD at check-in to the Emergency Department and triage nurse.   

## 2018-08-15 NOTE — Progress Notes (Signed)
  [facility] OFFICE PROGRESS NOTE   DIAGNOSIS:stage IIIA (T3, N2, M0) non-small cell lung cancer, adenosquamous carcinoma diagnosed in June 2019 and presented with large right upper lobe lung mass with questionable chest wall invasion as well as right hilar and mediastinal lymphadenopathy.  Biomarker Findings Tumor Mutational Burden - TMB-High (21 Muts/Mb) Microsatellite status - MS-Stable Genomic Findings For a complete list of the genes assayed, please refer to the Appendix. ATM H9622* CCND2 P281R KRAS G12V CHEK2 T377f*15 TP53 E298* 7 Disease relevant genes with no reportable alterations: ALK, EGFR, BRAF, MET, RET, ERBB2, ROS1  PRIOR THERAPY:Concurrent chemoradiation with chemotherapy consisting of weekly carboplatin for an AUC of 2 and paclitaxel 45 mg/m2. First dose given on 04/03/2018.Status post6cycles.Last dose was giving 05/15/2018 with partial response.  CURRENT THERAPY:Consolidation treatment with immunotherapy with Imfinzi (Durvalumab) 10 mg/KG every 2 weeks. First dose 06/20/2018.Status post3cycles.    INTERVAL HISTORY:   Mr. THarmreturns as scheduled.  He completed cycle 4 Imfinzi 08/01/2018.  He feels well.  He denies nausea/vomiting.  No mouth sores.  No diarrhea.  He has intermittent constipation.  No rash.  He is smoking less.  He has stable dyspnea.  No cough.  No fever.  He denies pain.  He has a good appetite.  Objective:  Vital signs in last 24 hours:  Blood pressure 109/71, pulse 85, temperature 98 F (36.7 C), temperature source Oral, resp. rate 18, height 6' (1.829 m), weight 230 lb 12.8 oz (104.7 kg), SpO2 97 %.    HEENT: No thrush or ulcers. Resp: Lungs clear bilaterally. Cardio: Regular rate and rhythm. GI: Abdomen soft and nontender.  No hepatomegaly. Vascular: No left leg edema. Neuro: Alert and oriented. Skin: Skin has a dry appearance.  Small ecchymosis left upper arm. Port-A-Cath without erythema.   Lab  Results:  Lab Results  Component Value Date   WBC 5.4 08/15/2018   HGB 13.5 08/15/2018   HCT 42.1 08/15/2018   MCV 98.1 08/15/2018   PLT 164 08/15/2018   NEUTROABS 3.9 08/15/2018    Imaging:  No results found.  Medications: I have reviewed the patient's current medications.  Assessment/Plan: 1. Stage IIIa non-small cell lung cancer, adenosquamous carcinoma; status post concurrent chemoradiation with weekly carboplatin and Taxol x6 cycles.  Restaging CT scan following initial treatment showed pressure response.  He began immunotherapy with Imfinzi 06/20/2018, 4 cycles completed to date.  Disposition: Mr. TSojaappears stable.  He has completed 4 cycles of Imfinzi.  Overall he seems to be tolerating well.  Plan to proceed with cycle 5 today as scheduled pending results of the chemistry panel.  Per Dr. MJulien Nordmannhe will be referred for restaging CTs after he has completed 6 cycles.  Mr. THowattwill return for lab and follow-up in 2 weeks.  He will contact the office in the interim with any problems.  Plan reviewed with Dr. MJulien Nordmann    LNed CardANP/GNP-BC   08/15/2018  1:53 PM

## 2018-08-29 ENCOUNTER — Inpatient Hospital Stay: Payer: Medicare Other

## 2018-08-29 ENCOUNTER — Inpatient Hospital Stay: Payer: Medicare Other | Attending: Internal Medicine

## 2018-08-29 ENCOUNTER — Other Ambulatory Visit: Payer: Self-pay | Admitting: Internal Medicine

## 2018-08-29 ENCOUNTER — Telehealth: Payer: Self-pay | Admitting: Medical Oncology

## 2018-08-29 ENCOUNTER — Inpatient Hospital Stay (HOSPITAL_BASED_OUTPATIENT_CLINIC_OR_DEPARTMENT_OTHER): Payer: Medicare Other | Admitting: Internal Medicine

## 2018-08-29 ENCOUNTER — Encounter: Payer: Self-pay | Admitting: Internal Medicine

## 2018-08-29 ENCOUNTER — Telehealth: Payer: Self-pay

## 2018-08-29 VITALS — BP 103/65 | HR 64 | Temp 98.6°F | Resp 18 | Ht 73.0 in | Wt 223.7 lb

## 2018-08-29 DIAGNOSIS — Z95828 Presence of other vascular implants and grafts: Secondary | ICD-10-CM

## 2018-08-29 DIAGNOSIS — C3411 Malignant neoplasm of upper lobe, right bronchus or lung: Secondary | ICD-10-CM | POA: Diagnosis not present

## 2018-08-29 DIAGNOSIS — C3491 Malignant neoplasm of unspecified part of right bronchus or lung: Secondary | ICD-10-CM

## 2018-08-29 DIAGNOSIS — Z79899 Other long term (current) drug therapy: Secondary | ICD-10-CM | POA: Insufficient documentation

## 2018-08-29 DIAGNOSIS — C3490 Malignant neoplasm of unspecified part of unspecified bronchus or lung: Secondary | ICD-10-CM

## 2018-08-29 DIAGNOSIS — Z5112 Encounter for antineoplastic immunotherapy: Secondary | ICD-10-CM | POA: Insufficient documentation

## 2018-08-29 LAB — CBC WITH DIFFERENTIAL (CANCER CENTER ONLY)
Abs Immature Granulocytes: 0.05 10*3/uL (ref 0.00–0.07)
BASOS ABS: 0.1 10*3/uL (ref 0.0–0.1)
Basophils Relative: 1 %
EOS PCT: 6 %
Eosinophils Absolute: 0.4 10*3/uL (ref 0.0–0.5)
HEMATOCRIT: 45.4 % (ref 39.0–52.0)
Hemoglobin: 14.5 g/dL (ref 13.0–17.0)
IMMATURE GRANULOCYTES: 1 %
LYMPHS ABS: 0.8 10*3/uL (ref 0.7–4.0)
LYMPHS PCT: 11 %
MCH: 31 pg (ref 26.0–34.0)
MCHC: 31.9 g/dL (ref 30.0–36.0)
MCV: 97.2 fL (ref 80.0–100.0)
Monocytes Absolute: 0.5 10*3/uL (ref 0.1–1.0)
Monocytes Relative: 7 %
NEUTROS ABS: 5.2 10*3/uL (ref 1.7–7.7)
NEUTROS PCT: 74 %
NRBC: 0 % (ref 0.0–0.2)
Platelet Count: 169 10*3/uL (ref 150–400)
RBC: 4.67 MIL/uL (ref 4.22–5.81)
RDW: 13.1 % (ref 11.5–15.5)
WBC Count: 7 10*3/uL (ref 4.0–10.5)

## 2018-08-29 LAB — CMP (CANCER CENTER ONLY)
ALT: 34 U/L (ref 0–44)
ANION GAP: 7 (ref 5–15)
AST: 34 U/L (ref 15–41)
Albumin: 4 g/dL (ref 3.5–5.0)
Alkaline Phosphatase: 119 U/L (ref 38–126)
BUN: 9 mg/dL (ref 6–20)
CHLORIDE: 98 mmol/L (ref 98–111)
CO2: 32 mmol/L (ref 22–32)
Calcium: 9 mg/dL (ref 8.9–10.3)
Creatinine: 1.18 mg/dL (ref 0.61–1.24)
Glucose, Bld: 92 mg/dL (ref 70–99)
POTASSIUM: 4.4 mmol/L (ref 3.5–5.1)
SODIUM: 137 mmol/L (ref 135–145)
Total Bilirubin: 0.3 mg/dL (ref 0.3–1.2)
Total Protein: 7.2 g/dL (ref 6.5–8.1)

## 2018-08-29 LAB — TSH: TSH: 31.613 u[IU]/mL — AB (ref 0.320–4.118)

## 2018-08-29 MED ORDER — SODIUM CHLORIDE 0.9 % IV SOLN
Freq: Once | INTRAVENOUS | Status: AC
  Administered 2018-08-29: 15:00:00 via INTRAVENOUS
  Filled 2018-08-29: qty 250

## 2018-08-29 MED ORDER — SODIUM CHLORIDE 0.9% FLUSH
10.0000 mL | INTRAVENOUS | Status: DC | PRN
  Administered 2018-08-29: 10 mL
  Filled 2018-08-29: qty 10

## 2018-08-29 MED ORDER — HEPARIN SOD (PORK) LOCK FLUSH 100 UNIT/ML IV SOLN
500.0000 [IU] | Freq: Once | INTRAVENOUS | Status: AC | PRN
  Administered 2018-08-29: 500 [IU]
  Filled 2018-08-29: qty 5

## 2018-08-29 MED ORDER — LEVOTHYROXINE SODIUM 50 MCG PO CAPS: 50.0000 ug | ORAL_CAPSULE | Freq: Every day | ORAL | 1 refills | Status: DC

## 2018-08-29 MED ORDER — SODIUM CHLORIDE 0.9 % IV SOLN
9.7000 mg/kg | Freq: Once | INTRAVENOUS | Status: AC
  Administered 2018-08-29: 1000 mg via INTRAVENOUS
  Filled 2018-08-29: qty 20

## 2018-08-29 MED ORDER — SODIUM CHLORIDE 0.9% FLUSH
10.0000 mL | Freq: Once | INTRAVENOUS | Status: AC
  Administered 2018-08-29: 10 mL
  Filled 2018-08-29: qty 10

## 2018-08-29 NOTE — Telephone Encounter (Signed)
Curt Bears, MD  Ardeen Garland, RN        Please let him know that his TSH was elevated and they sent a prescription for levothyroxine to his pharmacy.    LVM for [name] .

## 2018-08-29 NOTE — Progress Notes (Signed)
[place] [phone]   [phone]  OFFICE PROGRESS NOTE  Redmond School, MD [place] [address]  DIAGNOSIS:stage IIIA (T3, N2, M0) non-small cell lung cancer, adenosquamous carcinoma diagnosed in June 2019 and presented with large right upper lobe lung mass with questionable chest wall invasion as well as right hilar and mediastinal lymphadenopathy.  Biomarker Findings Tumor Mutational Burden - TMB-High (21 Muts/Mb) Microsatellite status - MS-Stable Genomic Findings For a complete list of the genes assayed, please refer to the Appendix. ATM B5102* CCND2 P281R KRAS G12V CHEK2 T321f*15 TP53 E298* 7 Disease relevant genes with no reportable alterations: ALK, EGFR, BRAF, MET, RET, ERBB2, ROS1  PRIOR THERAPY:Concurrent chemoradiation with chemotherapy consisting of weekly carboplatin for an AUC of 2 and paclitaxel 45 mg/m2. First dose given on 04/03/2018.  Status post 6 cycles.  Last dose was giving 05/15/2018 with partial response.  CURRENT THERAPY:Consolidation treatment with immunotherapy with Imfinzi (Durvalumab) 10 mg/KG every 2 weeks.  First dose 06/20/2018.  Status post 5 cycles.  INTERVAL HISTORY: Mark REGINA57y.o. male returns to the clinic today for follow-up visit accompanied by his wife.  The patient is feeling fine today with no concerning complaints.  He continues to tolerate his treatment with Imfinzi fairly well.  He denied having any chest pain, shortness of breath, cough or hemoptysis.  He denied having any fever or chills.  He has no nausea, vomiting, diarrhea or constipation.  He is here today for evaluation before starting cycle #6.  MEDICAL HISTORY: Past Medical History:  Diagnosis Date  . Acute hepatitis C virus infection 06/19/2007   Qualifier: Diagnosis of  By: MLeilani MerlCMA, Tiffany    . Acute ST elevation myocardial infarction (STEMI) involving left anterior descending (LAD) coronary artery  ([facility] 05/26/2016  . Acute ST elevation myocardial infarction (STEMI) of lateral wall ([facility] 05/26/2016  . 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 Anxiety   . Asthma   . Atherosclerosis of native arteries of the extremities with intermittent claudication 12/16/2011  . Chronic hepatitis C without hepatic coma ([facility] 05/03/2014  . COPD with chronic bronchitis and emphysema ([facility] 03/13/2018   FEV1 57%  . Depression   . DVT (deep venous thrombosis) ([facility] 2009; 2019   RLE; LLE  . Encounter for antineoplastic chemotherapy 03/22/2018  . Hepatitis C    "tx'd in 2015"  . High cholesterol   . Ischemic cardiomyopathy 03/02/2018  . Non-small cell carcinoma of right lung, stage 3 (H[facility] 03/22/2018  . PAD (peripheral artery disease) ([facility] 01/25/2013  . Peripheral vascular disease, unspecified 07/20/2012  . Port-A-Cath in place 04/24/2018  . ST elevation myocardial infarction involving left anterior descending (LAD) coronary artery ([facility]   . STEMI (ST elevation myocardial infarction) ([facility] 05/26/2016  . Tobacco abuse 01/28/2016  . Tubular adenoma of colon 07/11/2014  . Urinary dribbling     ALLERGIES:  is allergic to lyrica [pregabalin] and neurontin [gabapentin].  MEDICATIONS:  Current Outpatient Medications  Medication Sig Dispense Refill  . alprazolam (XANAX) 2 MG tablet Take 2 mg by mouth 4 (four) times daily as needed for anxiety.     .Marland Kitchenamitriptyline (ELAVIL) 100 MG tablet Take 100 mg by mouth at bedtime.     .Marland Kitchenaspirin 81 MG EC tablet Take 81 mg by mouth daily.      .Marland Kitchenatorvastatin (LIPITOR) 80 MG tablet Take 1 tablet (80 mg total) by mouth daily at 6 PM. 30 tablet 12  .  carvedilol (COREG) 6.25 MG tablet TAKE 1 TABLET BY MOUTH TWICE A DAY WITH A MEAL 60 tablet 6  . citalopram (CELEXA) 40 MG tablet Take 40 mg by mouth daily.  11  . furosemide (LASIX) 20 MG tablet Take 1 tablet (20 mg total) by mouth daily. 90 tablet 3  . HYDROcodone-acetaminophen (NORCO) 10-325 MG tablet Take 1  tablet by mouth every 4 (four) hours as needed for moderate pain.   0  . levETIRAcetam (KEPPRA) 750 MG tablet Take 1 tablet (750 mg total) by mouth 2 (two) times daily. 60 tablet 3  . lidocaine-prilocaine (EMLA) cream Apply 1 application topically as needed. 30 g 0  . lisinopril (PRINIVIL,ZESTRIL) 2.5 MG tablet Take 1 tablet (2.5 mg total) by mouth daily. 30 tablet 12  . nitroGLYCERIN (NITROSTAT) 0.4 MG SL tablet Place 1 tablet (0.4 mg total) under the tongue every 5 (five) minutes x 3 doses as needed for chest pain. (Patient not taking: Reported on 08/15/2018) 25 tablet 2  . prochlorperazine (COMPAZINE) 10 MG tablet Take 1 tablet (10 mg total) by mouth every 6 (six) hours as needed for nausea or vomiting. 30 tablet 0  . spironolactone (ALDACTONE) 25 MG tablet TAKE 1 TABLET BY MOUTH EVERY DAY 90 tablet 3  . sucralfate (CARAFATE) 1 g tablet Take 1 tablet (1 g total) by mouth 4 (four) times daily -  with meals and at bedtime. 5 min before meals for radiation induced esophagitis 120 tablet 2  . tamsulosin (FLOMAX) 0.4 MG CAPS capsule Take 1 capsule by mouth daily.    . Tiotropium Bromide-Olodaterol (STIOLTO RESPIMAT) 2.5-2.5 MCG/ACT AERS Inhale 2 puffs into the lungs daily. 1 Inhaler 5  . umeclidinium-vilanterol (ANORO ELLIPTA) 62.5-25 MCG/INH AEPB Inhale 1 puff into the lungs daily. 1 each 0   No current facility-administered medications for this visit.     SURGICAL HISTORY:  Past Surgical History:  Procedure Laterality Date  . AORTOGRAM  08/08/2009   for LLE claudication     By Dr. Oneida Alar  . 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 BIOPSY N/A 05/30/2014   Procedure: BIOPSY;  Surgeon: [name], MD;  [facility]: [facility];  Service: Endoscopy;  Laterality: N/A;  . BLADDER TUMOR EXCISION  8/812  . CARDIAC CATHETERIZATION N/A 05/26/2016   Procedure: Left Heart Cath and Coronary Angiography;  Surgeon: [name], MD;  Location: [facility];  Service: Cardiovascular;  Laterality: N/A;   . CARDIAC CATHETERIZATION N/A 05/26/2016   Procedure: Coronary Stent Intervention;  Surgeon: [name], MD;  Location: [facility];  Service: Cardiovascular;  Laterality: N/A;  . COLONOSCOPY WITH PROPOFOL N/A 05/30/2014   QQP:YPPJKD colonic polyp-likely source of hematochezia-removed as described above  . ESOPHAGOGASTRODUODENOSCOPY (EGD) WITH PROPOFOL N/A 05/30/2014   TOI:ZTIWPY and bulbar erosions s/p gastric biopsy. No evidence of portal gastropathy on today's examination.  . FEMORAL-TIBIAL BYPASS GRAFT  2009   Right side using non-reversed GSV   By Dr. Oneida Alar  . FEMORAL-TIBIAL BYPASS GRAFT  11/24/2007   Right femoral to anterior tibial BPG   by Dr. Oneida Alar  . FINGER SURGERY Left    "straightened my pinky"  . HAND TENDON SURGERY Left 2013   Left 5th finger  . HERNIA REPAIR     "stomach"  . ICD IMPLANT N/A 03/02/2018   Procedure: ICD IMPLANT;  Surgeon: [name], MD;  Location: [place] [facility];  Service: Cardiovascular;  Laterality: N/A;  . INCISIONAL HERNIA REPAIR N/A 12/25/2014  Procedure: HERNIA REPAIR INCISIONAL WITH MESH;  Surgeon: [name], MD;  Location: [facility];  Service: General;  Laterality: N/A;  . INSERTION OF MESH N/A 12/25/2014   Procedure: INSERTION OF MESH;  Surgeon: [name], MD;  Location: [facility];  Service: General;  Laterality: N/A;  . IR IMAGING GUIDED PORT INSERTION  04/11/2018  . LAPAROSCOPIC CHOLECYSTECTOMY    . LOWER EXTREMITY ANGIOGRAM Left 12/30/2015   Procedure: Lower Extremity Angiogram;  Surgeon: [name], MD;  Location: [place] [facility];  Service: Cardiovascular;  Laterality: Left;  . PERIPHERAL VASCULAR CATHETERIZATION N/A 12/30/2015   Procedure: Abdominal Aortogram;  Surgeon: [name], MD;  Location: [facility];  Service: Cardiovascular;  Laterality: N/A;  . POLYPECTOMY  05/30/2014   Procedure: POLYPECTOMY;  Surgeon: [name], MD;  [facility]: [facility];  Service: Endoscopy;;  . TONSILLECTOMY AND  ADENOIDECTOMY  ~ 1970  . TYMPANOSTOMY TUBE PLACEMENT Bilateral ~ 1970    REVIEW OF SYSTEMS:  A comprehensive review of systems was negative except for: Constitutional: positive for fatigue   PHYSICAL EXAMINATION: General appearance: alert, cooperative and no distress Head: Normocephalic, without obvious abnormality, atraumatic Neck: no adenopathy, no JVD, supple, symmetrical, trachea midline and thyroid not enlarged, symmetric, no tenderness/mass/nodules Lymph nodes: Cervical, supraclavicular, and axillary nodes normal. Resp: clear to auscultation bilaterally Back: symmetric, no curvature. ROM normal. No CVA tenderness. Cardio: regular rate and rhythm, S1, S2 normal, no murmur, click, rub or gallop GI: soft, non-tender; bowel sounds normal; no masses,  no organomegaly Extremities: Right below knee amputation otherwise no edema.  ECOG PERFORMANCE STATUS: 1 - Symptomatic but completely ambulatory  Blood pressure 103/65, pulse 64, temperature 98.6 F (37 C), temperature source Oral, resp. rate 18, height '6\' 1"'$  (1.854 m), weight 223 lb 11.2 oz (101.5 kg), SpO2 100 %.  LABORATORY DATA: Lab Results  Component Value Date   WBC 7.0 08/29/2018   HGB 14.5 08/29/2018   HCT 45.4 08/29/2018   MCV 97.2 08/29/2018   PLT 169 08/29/2018      Chemistry      Component Value Date/Time   NA 137 08/15/2018 1233   NA 139 02/27/2018 1457   K 4.5 08/15/2018 1233   CL 100 08/15/2018 1233   CO2 30 08/15/2018 1233   BUN 7 08/15/2018 1233   BUN 7 02/27/2018 1457   CREATININE 0.89 08/15/2018 1233   CREATININE 1.13 06/28/2016 1453      Component Value Date/Time   CALCIUM 9.3 08/15/2018 1233   ALKPHOS 137 (H) 08/15/2018 1233   AST 21 08/15/2018 1233   ALT 31 08/15/2018 1233   BILITOT 0.3 08/15/2018 1233       RADIOGRAPHIC STUDIES: No results found.  ASSESSMENT AND PLAN: This is a very pleasant 57 years old white male recently diagnosed with a stage IIIa non-small cell lung cancer,  adenosquamous carcinoma.  He underwent a course of concurrent chemoradiation with weekly carboplatin and paclitaxel status post 6 cycles with partial response. The patient is currently undergoing treatment with consolidation immunotherapy with Imfinzi status post 5 cycles. He is tolerating this treatment well with no concerning complaints. I recommended for him to proceed with cycle #6 today. I will see him back for follow-up visit in 2 weeks for evaluation after repeating CT scan of the chest for restaging of his disease. The patient was advised to call immediately if he has any concerning symptoms in the interval. The patient voices understanding of current disease status and  treatment options and is in agreement with the current care plan.  All questions were answered. The patient knows to call the clinic with any problems, questions or concerns. We can certainly see the patient much sooner if necessary.  I spent 10 minutes counseling the patient face to face. The total time spent in the appointment was 15 minutes.  Disclaimer: This note was dictated with voice recognition software. Similar sounding words can inadvertently be transcribed and may not be corrected upon review.

## 2018-08-29 NOTE — Telephone Encounter (Signed)
Printed avs and calender of upcoming appointment. Per 12/3 los 

## 2018-08-29 NOTE — Patient Instructions (Signed)
[facility] Discharge Instructions for Patients Receiving Chemotherapy  Today you received the following chemotherapy agents :  Durvalumab.  To help prevent nausea and vomiting after your treatment, we encourage you to take your nausea medication as prescribed.   If you develop nausea and vomiting that is not controlled by your nausea medication, call the clinic.   BELOW ARE SYMPTOMS THAT SHOULD BE REPORTED IMMEDIATELY:  *FEVER GREATER THAN 100.5 F  *CHILLS WITH OR WITHOUT FEVER  NAUSEA AND VOMITING THAT IS NOT CONTROLLED WITH YOUR NAUSEA MEDICATION  *UNUSUAL SHORTNESS OF BREATH  *UNUSUAL BRUISING OR BLEEDING  TENDERNESS IN MOUTH AND THROAT WITH OR WITHOUT PRESENCE OF ULCERS  *URINARY PROBLEMS  *BOWEL PROBLEMS  UNUSUAL RASH Items with * indicate a potential emergency and should be followed up as soon as possible.  Feel free to call the clinic should you have any questions or concerns. The clinic phone number is [phone].  Please show the [facility] at check-in to the Emergency Department and triage nurse.

## 2018-09-05 ENCOUNTER — Ambulatory Visit (INDEPENDENT_AMBULATORY_CARE_PROVIDER_SITE_OTHER): Payer: Medicare Other

## 2018-09-05 DIAGNOSIS — I255 Ischemic cardiomyopathy: Secondary | ICD-10-CM

## 2018-09-05 DIAGNOSIS — I5042 Chronic combined systolic (congestive) and diastolic (congestive) heart failure: Secondary | ICD-10-CM

## 2018-09-07 NOTE — Progress Notes (Signed)
Remote ICD transmission.   

## 2018-09-11 ENCOUNTER — Ambulatory Visit (HOSPITAL_COMMUNITY): Admission: RE | Admit: 2018-09-11 | Payer: Medicare Other | Source: Ambulatory Visit

## 2018-09-12 ENCOUNTER — Inpatient Hospital Stay: Payer: Medicare Other

## 2018-09-12 ENCOUNTER — Other Ambulatory Visit: Payer: Self-pay | Admitting: Medical Oncology

## 2018-09-12 ENCOUNTER — Inpatient Hospital Stay (HOSPITAL_BASED_OUTPATIENT_CLINIC_OR_DEPARTMENT_OTHER): Payer: Medicare Other | Admitting: Internal Medicine

## 2018-09-12 ENCOUNTER — Telehealth: Payer: Self-pay

## 2018-09-12 ENCOUNTER — Encounter: Payer: Self-pay | Admitting: Internal Medicine

## 2018-09-12 ENCOUNTER — Other Ambulatory Visit: Payer: Self-pay | Admitting: Internal Medicine

## 2018-09-12 VITALS — BP 93/74 | HR 62 | Temp 98.6°F | Resp 18 | Ht 73.0 in | Wt 229.6 lb

## 2018-09-12 DIAGNOSIS — C3491 Malignant neoplasm of unspecified part of right bronchus or lung: Secondary | ICD-10-CM

## 2018-09-12 DIAGNOSIS — Z5112 Encounter for antineoplastic immunotherapy: Secondary | ICD-10-CM

## 2018-09-12 DIAGNOSIS — Z79899 Other long term (current) drug therapy: Secondary | ICD-10-CM | POA: Diagnosis not present

## 2018-09-12 DIAGNOSIS — Z95828 Presence of other vascular implants and grafts: Secondary | ICD-10-CM

## 2018-09-12 DIAGNOSIS — C3411 Malignant neoplasm of upper lobe, right bronchus or lung: Secondary | ICD-10-CM

## 2018-09-12 LAB — CBC WITH DIFFERENTIAL (CANCER CENTER ONLY)
ABS IMMATURE GRANULOCYTES: 0.03 10*3/uL (ref 0.00–0.07)
Basophils Absolute: 0.1 10*3/uL (ref 0.0–0.1)
Basophils Relative: 1 %
Eosinophils Absolute: 0.6 10*3/uL — ABNORMAL HIGH (ref 0.0–0.5)
Eosinophils Relative: 9 %
HCT: 45.4 % (ref 39.0–52.0)
Hemoglobin: 14.9 g/dL (ref 13.0–17.0)
Immature Granulocytes: 0 %
Lymphocytes Relative: 11 %
Lymphs Abs: 0.7 10*3/uL (ref 0.7–4.0)
MCH: 31.2 pg (ref 26.0–34.0)
MCHC: 32.8 g/dL (ref 30.0–36.0)
MCV: 95.2 fL (ref 80.0–100.0)
MONO ABS: 0.5 10*3/uL (ref 0.1–1.0)
Monocytes Relative: 7 %
NEUTROS ABS: 4.9 10*3/uL (ref 1.7–7.7)
Neutrophils Relative %: 72 %
Platelet Count: 184 10*3/uL (ref 150–400)
RBC: 4.77 MIL/uL (ref 4.22–5.81)
RDW: 13.1 % (ref 11.5–15.5)
WBC Count: 6.8 10*3/uL (ref 4.0–10.5)
nRBC: 0 % (ref 0.0–0.2)

## 2018-09-12 LAB — CMP (CANCER CENTER ONLY)
ALT: 50 U/L — ABNORMAL HIGH (ref 0–44)
AST: 39 U/L (ref 15–41)
Albumin: 4.2 g/dL (ref 3.5–5.0)
Alkaline Phosphatase: 115 U/L (ref 38–126)
Anion gap: 8 (ref 5–15)
BILIRUBIN TOTAL: 0.3 mg/dL (ref 0.3–1.2)
BUN: 8 mg/dL (ref 6–20)
CO2: 31 mmol/L (ref 22–32)
Calcium: 9.2 mg/dL (ref 8.9–10.3)
Chloride: 99 mmol/L (ref 98–111)
Creatinine: 1.22 mg/dL (ref 0.61–1.24)
GFR, Est AFR Am: >60 mL/min (ref >60)
GFR, Estimated: >60 mL/min (ref >60)
Glucose, Bld: 81 mg/dL (ref 70–99)
POTASSIUM: 4.3 mmol/L (ref 3.5–5.1)
Sodium: 138 mmol/L (ref 135–145)
Total Protein: 7.4 g/dL (ref 6.5–8.1)

## 2018-09-12 MED ORDER — SODIUM CHLORIDE 0.9 % IV SOLN
9.7000 mg/kg | Freq: Once | INTRAVENOUS | Status: AC
  Administered 2018-09-12: 1000 mg via INTRAVENOUS
  Filled 2018-09-12: qty 20

## 2018-09-12 MED ORDER — SODIUM CHLORIDE 0.9% FLUSH
10.0000 mL | INTRAVENOUS | Status: DC | PRN
  Administered 2018-09-12: 10 mL
  Filled 2018-09-12: qty 10

## 2018-09-12 MED ORDER — HEPARIN SOD (PORK) LOCK FLUSH 100 UNIT/ML IV SOLN
500.0000 [IU] | Freq: Once | INTRAVENOUS | Status: AC | PRN
  Administered 2018-09-12: 500 [IU]
  Filled 2018-09-12: qty 5

## 2018-09-12 MED ORDER — SODIUM CHLORIDE 0.9% FLUSH
10.0000 mL | Freq: Once | INTRAVENOUS | Status: AC
  Administered 2018-09-12: 10 mL
  Filled 2018-09-12: qty 10

## 2018-09-12 MED ORDER — SODIUM CHLORIDE 0.9 % IV SOLN
Freq: Once | INTRAVENOUS | Status: AC
  Administered 2018-09-12: 15:00:00 via INTRAVENOUS
  Filled 2018-09-12: qty 250

## 2018-09-12 NOTE — Telephone Encounter (Signed)
Printed avs and calender of upcoming appointment. Per 12/17 los

## 2018-09-12 NOTE — Progress Notes (Signed)
[place] [phone]   Fax:(336) [phone]  OFFICE PROGRESS NOTE  Redmond School, MD [place] [address]  DIAGNOSIS:stage IIIA (T3, N2, M0) non-small cell lung cancer, adenosquamous carcinoma diagnosed in June 2019 and presented with large right upper lobe lung mass with questionable chest wall invasion as well as right hilar and mediastinal lymphadenopathy.  Biomarker Findings Tumor Mutational Burden - TMB-High (21 Muts/Mb) Microsatellite status - MS-Stable Genomic Findings For a complete list of the genes assayed, please refer to the Appendix. ATM W1191* CCND2 P281R KRAS G12V CHEK2 T343f*15 TP53 E298* 7 Disease relevant genes with no reportable alterations: ALK, EGFR, BRAF, MET, RET, ERBB2, ROS1  PRIOR THERAPY:Concurrent chemoradiation with chemotherapy consisting of weekly carboplatin for an AUC of 2 and paclitaxel 45 mg/m2. First dose given on 04/03/2018.  Status post 6 cycles.  Last dose was giving 05/15/2018 with partial response.  CURRENT THERAPY:Consolidation treatment with immunotherapy with Imfinzi (Durvalumab) 10 mg/KG every 2 weeks.  First dose 06/20/2018.  Status post 6 cycles.  INTERVAL HISTORY: Mark HELLEN57y.o. male returns to the clinic today for follow-up visit accompanied by his wife.  The patient has no complaints today.  He continues to tolerate his treatment with Imfinzi fairly well.  He denied having any current chest pain, shortness of breath, cough or hemoptysis.  He has no nausea, vomiting, diarrhea or constipation.  He was supposed to have repeat CT scan of the chest before this visit but he missed his appointment.  He is here today for evaluation before starting cycle #7.  MEDICAL HISTORY: Past Medical History:  Diagnosis Date  . Acute hepatitis C virus infection 06/19/2007   Qualifier: Diagnosis of  By: MLeilani MerlCMA, Tiffany    . Acute ST elevation myocardial infarction (STEMI) involving left  anterior descending (LAD) coronary artery ([facility] 05/26/2016  . Acute ST elevation myocardial infarction (STEMI) of lateral wall ([facility] 05/26/2016  . 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 Anxiety   . Asthma   . Atherosclerosis of native arteries of the extremities with intermittent claudication 12/16/2011  . Chronic hepatitis C without hepatic coma ([facility] 05/03/2014  . COPD with chronic bronchitis and emphysema (HC[facility] 03/13/2018   FEV1 57%  . Depression   . DVT (deep venous thrombosis) (HC[facility] 2009; 2019   RLE; LLE  . Encounter for antineoplastic chemotherapy 03/22/2018  . Hepatitis C    "tx'd in 2015"  . High cholesterol   . Ischemic cardiomyopathy 03/02/2018  . Non-small cell carcinoma of right lung, stage 3 (HR[facility] 03/22/2018  . PAD (peripheral artery disease) (H[facility] 01/25/2013  . Peripheral vascular disease, unspecified 07/20/2012  . Port-A-Cath in place 04/24/2018  . ST elevation myocardial infarction involving left anterior descending (LAD) coronary artery (HP[facility]   . STEMI (ST elevation myocardial infarction) (HS[facility] 05/26/2016  . Tobacco abuse 01/28/2016  . Tubular adenoma of colon 07/11/2014  . Urinary dribbling     ALLERGIES:  is allergic to lyrica [pregabalin] and neurontin [gabapentin].  MEDICATIONS:  Current Outpatient Medications  Medication Sig Dispense Refill  . alprazolam (XANAX) 2 MG tablet Take 2 mg by mouth 4 (four) times daily as needed for anxiety.     .Marland Kitchenamitriptyline (ELAVIL) 100 MG tablet Take 100 mg by mouth at bedtime.     .Marland Kitchenaspirin 81 MG EC tablet Take 81 mg by mouth daily.      .Marland Kitchenatorvastatin (LIPITOR) 80 MG tablet Take 1 tablet (80 mg total) by mouth  daily at 6 PM. 30 tablet 12  . carvedilol (COREG) 6.25 MG tablet TAKE 1 TABLET BY MOUTH TWICE A DAY WITH A MEAL 60 tablet 6  . citalopram (CELEXA) 40 MG tablet Take 40 mg by mouth daily.  11  . furosemide (LASIX) 20 MG tablet Take 1 tablet (20 mg total) by mouth daily. 90 tablet 3  .  HYDROcodone-acetaminophen (NORCO) 10-325 MG tablet Take 1 tablet by mouth every 4 (four) hours as needed for moderate pain.   0  . levETIRAcetam (KEPPRA) 750 MG tablet Take 1 tablet (750 mg total) by mouth 2 (two) times daily. 60 tablet 3  . Levothyroxine Sodium 50 MCG CAPS Take 1 capsule (50 mcg total) by mouth daily before breakfast. 30 capsule 1  . lidocaine-prilocaine (EMLA) cream Apply 1 application topically as needed. 30 g 0  . lisinopril (PRINIVIL,ZESTRIL) 2.5 MG tablet Take 1 tablet (2.5 mg total) by mouth daily. 30 tablet 12  . nitroGLYCERIN (NITROSTAT) 0.4 MG SL tablet Place 1 tablet (0.4 mg total) under the tongue every 5 (five) minutes x 3 doses as needed for chest pain. (Patient not taking: Reported on 08/15/2018) 25 tablet 2  . prochlorperazine (COMPAZINE) 10 MG tablet Take 1 tablet (10 mg total) by mouth every 6 (six) hours as needed for nausea or vomiting. 30 tablet 0  . spironolactone (ALDACTONE) 25 MG tablet TAKE 1 TABLET BY MOUTH EVERY DAY 90 tablet 3  . sucralfate (CARAFATE) 1 g tablet Take 1 tablet (1 g total) by mouth 4 (four) times daily -  with meals and at bedtime. 5 min before meals for radiation induced esophagitis 120 tablet 2  . tamsulosin (FLOMAX) 0.4 MG CAPS capsule Take 1 capsule by mouth daily.    . Tiotropium Bromide-Olodaterol (STIOLTO RESPIMAT) 2.5-2.5 MCG/ACT AERS Inhale 2 puffs into the lungs daily. 1 Inhaler 5  . umeclidinium-vilanterol (ANORO ELLIPTA) 62.5-25 MCG/INH AEPB Inhale 1 puff into the lungs daily. 1 each 0   No current facility-administered medications for this visit.     SURGICAL HISTORY:  Past Surgical History:  Procedure Laterality Date  . AORTOGRAM  08/08/2009   for LLE claudication     By Dr. Oneida Alar  . 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 BIOPSY N/A 05/30/2014   Procedure: BIOPSY;  Surgeon: [name], MD;  Location: [facility];  Service: Endoscopy;  Laterality: N/A;  . BLADDER TUMOR EXCISION  8/812  . CARDIAC CATHETERIZATION N/A  05/26/2016   Procedure: Left Heart Cath and Coronary Angiography;  Surgeon: [name], MD;  Location: [facility];  Service: Cardiovascular;  Laterality: N/A;  . CARDIAC CATHETERIZATION N/A 05/26/2016   Procedure: Coronary Stent Intervention;  Surgeon: [name], MD;  Location: [facility];  Service: Cardiovascular;  Laterality: N/A;  . COLONOSCOPY WITH PROPOFOL N/A 05/30/2014   ZHY:QMVHQI colonic polyp-likely source of hematochezia-removed as described above  . ESOPHAGOGASTRODUODENOSCOPY (EGD) WITH PROPOFOL N/A 05/30/2014   ONG:EXBMWU and bulbar erosions s/p gastric biopsy. No evidence of portal gastropathy on today's examination.  . FEMORAL-TIBIAL BYPASS GRAFT  2009   Right side using non-reversed GSV   By Dr. Oneida Alar  . FEMORAL-TIBIAL BYPASS GRAFT  11/24/2007   Right femoral to anterior tibial BPG   by Dr. Oneida Alar  . FINGER SURGERY Left    "straightened my pinky"  . HAND TENDON SURGERY Left 2013   Left 5th finger  . HERNIA REPAIR     "stomach"  . ICD IMPLANT N/A 03/02/2018  Procedure: ICD IMPLANT;  Surgeon: [name], MD;  Location: [facility];  Service: Cardiovascular;  Laterality: N/A;  . INCISIONAL HERNIA REPAIR N/A 12/25/2014   Procedure: HERNIA REPAIR INCISIONAL WITH MESH;  Surgeon: [name], MD;  Location: [facility];  Service: General;  Laterality: N/A;  . INSERTION OF MESH N/A 12/25/2014   Procedure: INSERTION OF MESH;  Surgeon: [name], MD;  Location: [facility];  Service: General;  Laterality: N/A;  . IR IMAGING GUIDED PORT INSERTION  04/11/2018  . LAPAROSCOPIC CHOLECYSTECTOMY    . LOWER EXTREMITY ANGIOGRAM Left 12/30/2015   Procedure: Lower Extremity Angiogram;  Surgeon: [name], MD;  Location: [facility];  Service: Cardiovascular;  Laterality: Left;  . PERIPHERAL VASCULAR CATHETERIZATION N/A 12/30/2015   Procedure: Abdominal Aortogram;  Surgeon: [name], MD;  Location: [facility];  Service: Cardiovascular;   Laterality: N/A;  . POLYPECTOMY  05/30/2014   Procedure: POLYPECTOMY;  Surgeon: [name], MD;  Location[facility];  Service: Endoscopy;;  . TONSILLECTOMY AND ADENOIDECTOMY  ~ 1970  . TYMPANOSTOMY TUBE PLACEMENT Bilateral ~ 1970    REVIEW OF SYSTEMS:  A comprehensive review of systems was negative except for: Constitutional: positive for fatigue   PHYSICAL EXAMINATION: General appearance: alert, cooperative and no distress Head: Normocephalic, without obvious abnormality, atraumatic Neck: no adenopathy, no JVD, supple, symmetrical, trachea midline and thyroid not enlarged, symmetric, no tenderness/mass/nodules Lymph nodes: Cervical, supraclavicular, and axillary nodes normal. Resp: clear to auscultation bilaterally Back: symmetric, no curvature. ROM normal. No CVA tenderness. Cardio: regular rate and rhythm, S1, S2 normal, no murmur, click, rub or gallop GI: soft, non-tender; bowel sounds normal; no masses,  no organomegaly Extremities: Right below knee amputation otherwise no edema.  ECOG PERFORMANCE STATUS: 1 - Symptomatic but completely ambulatory  Blood pressure 93/74, pulse 62, temperature 98.6 F (37 C), temperature source Oral, resp. rate 18, height '6\' 1"'$  (1.854 m), weight 229 lb 9.6 oz (104.1 kg), SpO2 94 %.  LABORATORY DATA: Lab Results  Component Value Date   WBC 6.8 09/12/2018   HGB 14.9 09/12/2018   HCT 45.4 09/12/2018   MCV 95.2 09/12/2018   PLT 184 09/12/2018      Chemistry      Component Value Date/Time   NA 138 09/12/2018 1319   NA 139 02/27/2018 1457   K 4.3 09/12/2018 1319   CL 99 09/12/2018 1319   CO2 31 09/12/2018 1319   BUN 8 09/12/2018 1319   BUN 7 02/27/2018 1457   CREATININE 1.22 09/12/2018 1319   CREATININE 1.13 06/28/2016 1453      Component Value Date/Time   CALCIUM 9.2 09/12/2018 1319   ALKPHOS 115 09/12/2018 1319   AST 39 09/12/2018 1319   ALT 50 (H) 09/12/2018 1319   BILITOT 0.3 09/12/2018 1319       RADIOGRAPHIC STUDIES: No  results found.  ASSESSMENT AND PLAN: This is a very pleasant 57 years old white male recently diagnosed with a stage IIIa non-small cell lung cancer, adenosquamous carcinoma.  He underwent a course of concurrent chemoradiation with weekly carboplatin and paclitaxel status post 6 cycles with partial response. The patient is currently undergoing treatment with consolidation immunotherapy with Imfinzi status post 6 cycles. He continues to tolerate this treatment fairly well with no concerning complaints. I recommended for him to proceed with cycle #7 today. He is scheduled to have repeat CT scan of the chest on September 18, 2018. I will see  the patient back for follow-up visit in 2 weeks for evaluation before starting cycle #8. He was advised to call immediately if he has any concerning symptoms in the interval. The patient voices understanding of current disease status and treatment options and is in agreement with the current care plan. All questions were answered. The patient knows to call the clinic with any problems, questions or concerns. We can certainly see the patient much sooner if necessary.  I spent 10 minutes counseling the patient face to face. The total time spent in the appointment was 15 minutes.  Disclaimer: This note was dictated with voice recognition software. Similar sounding words can inadvertently be transcribed and may not be corrected upon review.

## 2018-09-12 NOTE — Patient Instructions (Signed)
Cancer Center Discharge Instructions for Patients Receiving Chemotherapy  Today you received the following chemotherapy agents: Imfinzi.  To help prevent nausea and vomiting after your treatment, we encourage you to take your nausea medication as directed.   If you develop nausea and vomiting that is not controlled by your nausea medication, call the clinic.   BELOW ARE SYMPTOMS THAT SHOULD BE REPORTED IMMEDIATELY:  *FEVER GREATER THAN 100.5 F  *CHILLS WITH OR WITHOUT FEVER  NAUSEA AND VOMITING THAT IS NOT CONTROLLED WITH YOUR NAUSEA MEDICATION  *UNUSUAL SHORTNESS OF BREATH  *UNUSUAL BRUISING OR BLEEDING  TENDERNESS IN MOUTH AND THROAT WITH OR WITHOUT PRESENCE OF ULCERS  *URINARY PROBLEMS  *BOWEL PROBLEMS  UNUSUAL RASH Items with * indicate a potential emergency and should be followed up as soon as possible.  Feel free to call the clinic should you have any questions or concerns. The clinic phone number is [phone].  Please show the CHEMO ALERT CARD at check-in to the Emergency Department and triage nurse.   

## 2018-09-12 NOTE — Patient Instructions (Signed)

## 2018-09-13 DIAGNOSIS — J449 Chronic obstructive pulmonary disease, unspecified: Secondary | ICD-10-CM | POA: Diagnosis not present

## 2018-09-13 DIAGNOSIS — C3490 Malignant neoplasm of unspecified part of unspecified bronchus or lung: Secondary | ICD-10-CM | POA: Diagnosis not present

## 2018-09-13 DIAGNOSIS — Z1389 Encounter for screening for other disorder: Secondary | ICD-10-CM | POA: Diagnosis not present

## 2018-09-13 DIAGNOSIS — G894 Chronic pain syndrome: Secondary | ICD-10-CM | POA: Diagnosis not present

## 2018-09-18 ENCOUNTER — Ambulatory Visit (HOSPITAL_COMMUNITY)
Admission: RE | Admit: 2018-09-18 | Discharge: 2018-09-18 | Disposition: A | Discharge status: Home / Self Care | Payer: Medicare Other | Source: Ambulatory Visit | Attending: Internal Medicine | Admitting: Internal Medicine

## 2018-09-18 DIAGNOSIS — C3490 Malignant neoplasm of unspecified part of unspecified bronchus or lung: Secondary | ICD-10-CM | POA: Diagnosis not present

## 2018-09-18 MED ORDER — HEPARIN SOD (PORK) LOCK FLUSH 100 UNIT/ML IV SOLN
INTRAVENOUS | Status: AC
  Filled 2018-09-18: qty 5

## 2018-09-18 MED ORDER — IOHEXOL 300 MG/ML  SOLN
75.0000 mL | Freq: Once | INTRAMUSCULAR | Status: AC | PRN
  Administered 2018-09-18: 75 mL via INTRAVENOUS

## 2018-09-18 MED ORDER — HEPARIN SOD (PORK) LOCK FLUSH 100 UNIT/ML IV SOLN
500.0000 [IU] | Freq: Once | INTRAVENOUS | Status: AC
  Administered 2018-09-18: 500 [IU] via INTRAVENOUS

## 2018-09-18 MED ORDER — SODIUM CHLORIDE (PF) 0.9 % IJ SOLN
INTRAMUSCULAR | Status: AC
  Filled 2018-09-18: qty 50

## 2018-09-25 ENCOUNTER — Other Ambulatory Visit: Payer: Self-pay | Admitting: Cardiovascular Disease

## 2018-09-26 ENCOUNTER — Other Ambulatory Visit: Payer: Medicare Other

## 2018-09-26 ENCOUNTER — Inpatient Hospital Stay (HOSPITAL_BASED_OUTPATIENT_CLINIC_OR_DEPARTMENT_OTHER): Payer: Medicare Other | Admitting: Nurse Practitioner

## 2018-09-26 ENCOUNTER — Inpatient Hospital Stay: Payer: Medicare Other

## 2018-09-26 ENCOUNTER — Encounter: Payer: Self-pay | Admitting: Nurse Practitioner

## 2018-09-26 ENCOUNTER — Other Ambulatory Visit: Payer: Self-pay | Admitting: Internal Medicine

## 2018-09-26 VITALS — BP 86/63 | HR 66 | Temp 97.8°F | Resp 17 | Ht 73.0 in | Wt 217.5 lb

## 2018-09-26 DIAGNOSIS — Z720 Tobacco use: Secondary | ICD-10-CM

## 2018-09-26 DIAGNOSIS — Z79899 Other long term (current) drug therapy: Secondary | ICD-10-CM | POA: Diagnosis not present

## 2018-09-26 DIAGNOSIS — Z5112 Encounter for antineoplastic immunotherapy: Secondary | ICD-10-CM | POA: Diagnosis not present

## 2018-09-26 DIAGNOSIS — C3491 Malignant neoplasm of unspecified part of right bronchus or lung: Secondary | ICD-10-CM

## 2018-09-26 DIAGNOSIS — C3411 Malignant neoplasm of upper lobe, right bronchus or lung: Secondary | ICD-10-CM

## 2018-09-26 DIAGNOSIS — J449 Chronic obstructive pulmonary disease, unspecified: Secondary | ICD-10-CM

## 2018-09-26 DIAGNOSIS — Z95828 Presence of other vascular implants and grafts: Secondary | ICD-10-CM

## 2018-09-26 LAB — CBC WITH DIFFERENTIAL (CANCER CENTER ONLY)
Abs Immature Granulocytes: 0.05 10*3/uL (ref 0.00–0.07)
Basophils Absolute: 0.1 10*3/uL (ref 0.0–0.1)
Basophils Relative: 1 %
Eosinophils Absolute: 0.5 10*3/uL (ref 0.0–0.5)
Eosinophils Relative: 8 %
HCT: 44.4 % (ref 39.0–52.0)
HEMOGLOBIN: 14.4 g/dL (ref 13.0–17.0)
Immature Granulocytes: 1 %
LYMPHS PCT: 11 %
Lymphs Abs: 0.7 10*3/uL (ref 0.7–4.0)
MCH: 31.2 pg (ref 26.0–34.0)
MCHC: 32.4 g/dL (ref 30.0–36.0)
MCV: 96.3 fL (ref 80.0–100.0)
Monocytes Absolute: 0.5 10*3/uL (ref 0.1–1.0)
Monocytes Relative: 8 %
Neutro Abs: 4.4 10*3/uL (ref 1.7–7.7)
Neutrophils Relative %: 71 %
Platelet Count: 153 10*3/uL (ref 150–400)
RBC: 4.61 MIL/uL (ref 4.22–5.81)
RDW: 13.4 % (ref 11.5–15.5)
WBC Count: 6.1 10*3/uL (ref 4.0–10.5)
nRBC: 0 % (ref 0.0–0.2)

## 2018-09-26 LAB — CMP (CANCER CENTER ONLY)
ALT: 27 U/L (ref 0–44)
AST: 24 U/L (ref 15–41)
Albumin: 4.1 g/dL (ref 3.5–5.0)
Alkaline Phosphatase: 95 U/L (ref 38–126)
Anion gap: 9 (ref 5–15)
BUN: 7 mg/dL (ref 6–20)
CO2: 32 mmol/L (ref 22–32)
Calcium: 9.2 mg/dL (ref 8.9–10.3)
Chloride: 99 mmol/L (ref 98–111)
Creatinine: 1.18 mg/dL (ref 0.61–1.24)
GFR, Est AFR Am: >60 mL/min (ref >60)
GFR, Estimated: >60 mL/min (ref >60)
Glucose, Bld: 77 mg/dL (ref 70–99)
Potassium: 4 mmol/L (ref 3.5–5.1)
Sodium: 140 mmol/L (ref 135–145)
Total Bilirubin: 0.3 mg/dL (ref 0.3–1.2)
Total Protein: 7.2 g/dL (ref 6.5–8.1)

## 2018-09-26 LAB — TSH: TSH: 125.222 u[IU]/mL — AB (ref 0.320–4.118)

## 2018-09-26 MED ORDER — SODIUM CHLORIDE 0.9% FLUSH
10.0000 mL | Freq: Once | INTRAVENOUS | Status: AC
  Administered 2018-09-26: 10 mL
  Filled 2018-09-26: qty 10

## 2018-09-26 MED ORDER — SODIUM CHLORIDE 0.9 % IV SOLN
9.7000 mg/kg | Freq: Once | INTRAVENOUS | Status: AC
  Administered 2018-09-26: 1000 mg via INTRAVENOUS
  Filled 2018-09-26: qty 20

## 2018-09-26 MED ORDER — HEPARIN SOD (PORK) LOCK FLUSH 100 UNIT/ML IV SOLN
500.0000 [IU] | Freq: Once | INTRAVENOUS | Status: AC | PRN
  Administered 2018-09-26: 500 [IU]
  Filled 2018-09-26: qty 5

## 2018-09-26 MED ORDER — LEVOTHYROXINE SODIUM 50 MCG PO CAPS: 100.0000 ug | ORAL_CAPSULE | Freq: Every day | ORAL | 1 refills | Status: DC

## 2018-09-26 MED ORDER — SODIUM CHLORIDE 0.9 % IV SOLN
Freq: Once | INTRAVENOUS | Status: AC
  Administered 2018-09-26: 13:00:00 via INTRAVENOUS
  Filled 2018-09-26: qty 250

## 2018-09-26 MED ORDER — SODIUM CHLORIDE 0.9% FLUSH
10.0000 mL | INTRAVENOUS | Status: DC | PRN
  Administered 2018-09-26: 10 mL
  Filled 2018-09-26: qty 10

## 2018-09-26 NOTE — Patient Instructions (Signed)
[facility] Discharge Instructions for Patients Receiving Chemotherapy  Today you received the following chemotherapy agents: Imfinzi.  To help prevent nausea and vomiting after your treatment, we encourage you to take your nausea medication as directed.   If you develop nausea and vomiting that is not controlled by your nausea medication, call the clinic.   BELOW ARE SYMPTOMS THAT SHOULD BE REPORTED IMMEDIATELY:  *FEVER GREATER THAN 100.5 F  *CHILLS WITH OR WITHOUT FEVER  NAUSEA AND VOMITING THAT IS NOT CONTROLLED WITH YOUR NAUSEA MEDICATION  *UNUSUAL SHORTNESS OF BREATH  *UNUSUAL BRUISING OR BLEEDING  TENDERNESS IN MOUTH AND THROAT WITH OR WITHOUT PRESENCE OF ULCERS  *URINARY PROBLEMS  *BOWEL PROBLEMS  UNUSUAL RASH Items with * indicate a potential emergency and should be followed up as soon as possible.  Feel free to call the clinic should you have any questions or concerns. The clinic phone number is [phone].  Please show the CHEMO ALERT CARD at check-in to the Emergency Department and triage nurse.   

## 2018-09-26 NOTE — Progress Notes (Signed)
Labs and bp reviewed with MD ok to treat despite low BP, TSH elevated, MD gave VO for pt to increase synthroid to 136mcg day. Instructions for pt printed and handed to wife

## 2018-09-26 NOTE — Progress Notes (Addendum)
[facility] OFFICE PROGRESS NOTE   DIAGNOSIS:stage IIIA (T3, N2, M0) non-small cell lung cancer, adenosquamous carcinoma diagnosed in June 2019 and presented with large right upper lobe lung mass with questionable chest wall invasion as well as right hilar and mediastinal lymphadenopathy.  Biomarker Findings Tumor Mutational Burden - TMB-High (21 Muts/Mb) Microsatellite status - MS-Stable Genomic Findings For a complete list of the genes assayed, please refer to the Appendix. ATM J0932* CCND2 P281R KRAS G12V CHEK2 T312f*15 TP53 E298* 7 Disease relevant genes with no reportable alterations: ALK, EGFR, BRAF, MET, RET, ERBB2, ROS1  PRIOR THERAPY:Concurrent chemoradiation with chemotherapy consisting of weekly carboplatin for an AUC of 2 and paclitaxel 45 mg/m2. First dose given on 04/03/2018.Status post 6 cycles.  Last dose was giving 05/15/2018 with partial response.  CURRENT THERAPY:Consolidation treatment with immunotherapy with Imfinzi (Durvalumab) 10 mg/KG every 2 weeks.  First dose 06/20/2018.  Status post 7 cycles.     INTERVAL HISTORY:   Mr. TLennartzreturns as scheduled.  He completed cycle 7 Imfinzi 09/12/2018.  He denies nausea/vomiting.  No mouth sores.  No diarrhea.  No rash.  No change in baseline dyspnea.  He has decreased cigarettes to about 10/day.  He has a good appetite.  Objective:  Vital signs in last 24 hours:  Blood pressure 91/61, pulse 66, temperature 97.8 F (36.6 C), temperature source Oral, resp. rate 17, height 6' 1" (1.854 m), weight 217 lb 8 oz (98.7 kg), SpO2 95 %.    HEENT: No thrush or ulcers. Resp: Lungs clear bilaterally. Cardio: Regular rate and rhythm. GI: Abdomen soft and nontender.  No hepatomegaly. Vascular: No left leg edema. Skin: No rash. Port-A-Cath without erythema.   Lab Results:  Lab Results  Component Value Date   WBC 6.1 09/26/2018   HGB 14.4 09/26/2018   HCT 44.4 09/26/2018   MCV 96.3 09/26/2018   PLT 153 09/26/2018   NEUTROABS 4.4 09/26/2018    Imaging:  No results found.  Medications: I have reviewed the patient's current medications.  Assessment/Plan: 1. Stage IIIa non-small cell lung cancer, adenosquamous carcinoma.  He underwent a course of concurrent chemoradiation with weekly carboplatin and paclitaxel status post 6 cycles with partial response. The patient is currently undergoing treatment with consolidation immunotherapy with Imfinzi status post 7 cycles.  Disposition: Mr. TMelendrezappears stable.  He has completed 7 cycles of consolidation immunotherapy with Imfinzi.  Recent restaging chest CT shows the right upper lobe mass is smaller.  There has been interval development of small bilateral pulmonary nodules.  Dr. MJulien Nordmannreviewed the CT results with [name] and his wife at today's visit and recommends continuation of Imfinzi.  Plan to proceed with cycle 8 Imfinzi today as scheduled.  He will return for lab, follow-up and cycle 9 in 2 weeks.  He will contact the office in the interim with any problems.  Patient seen with Dr. MJulien Nordmann    LNed CardANP/GNP-BC   09/26/2018  11:44 AM  ADDENDUM: Hematology/Oncology Attending: I had a face-to-face encounter with the patient.  I recommended his care plan.  This is a very pleasant 57years old white male with stage IIIa non-small cell lung cancer, adenocarcinoma status post induction concurrent chemoradiation with weekly carboplatin and paclitaxel.  The patient is currently undergoing treatment with consolidation immunotherapy with Imfinzi status post 7 cycles. He has been tolerating this treatment well with no concerning adverse effects. He had repeat CT scan of the chest performed recently.  I personally and independently  reviewed the scan results with the patient and his wife.  His a scan showed improvement in the large right upper lobe lung mass but there was some suspicious small bilateral pulmonary nodules that need attention  on follow-up imaging studies. I recommended for the patient to proceed with cycle #8 today as scheduled. I will see him back for follow-up visit in 2 weeks for evaluation before starting cycle #9. The patient was advised to call immediately if he has any concerning symptoms in the interval.  Disclaimer: This note was dictated with voice recognition software. Similar sounding words can inadvertently be transcribed and may be missed upon review. Eilleen Kempf, MD 09/27/18

## 2018-10-03 ENCOUNTER — Encounter: Payer: Self-pay | Admitting: Internal Medicine

## 2018-10-03 ENCOUNTER — Inpatient Hospital Stay: Payer: Medicare Other | Attending: Internal Medicine | Admitting: Internal Medicine

## 2018-10-03 ENCOUNTER — Telehealth: Payer: Self-pay

## 2018-10-03 VITALS — BP 96/67 | HR 72 | Temp 97.6°F | Resp 17 | Ht 73.0 in | Wt 228.5 lb

## 2018-10-03 DIAGNOSIS — R569 Unspecified convulsions: Secondary | ICD-10-CM | POA: Diagnosis not present

## 2018-10-03 DIAGNOSIS — C3411 Malignant neoplasm of upper lobe, right bronchus or lung: Secondary | ICD-10-CM | POA: Diagnosis not present

## 2018-10-03 DIAGNOSIS — Z9181 History of falling: Secondary | ICD-10-CM | POA: Diagnosis not present

## 2018-10-03 DIAGNOSIS — Z720 Tobacco use: Secondary | ICD-10-CM | POA: Insufficient documentation

## 2018-10-03 DIAGNOSIS — Z5112 Encounter for antineoplastic immunotherapy: Secondary | ICD-10-CM | POA: Diagnosis not present

## 2018-10-03 DIAGNOSIS — Z79899 Other long term (current) drug therapy: Secondary | ICD-10-CM | POA: Insufficient documentation

## 2018-10-03 MED ORDER — LEVETIRACETAM 750 MG PO TABS: 750.0000 mg | ORAL_TABLET | Freq: Two times a day (BID) | ORAL | 5 refills | Status: DC

## 2018-10-03 NOTE — Telephone Encounter (Signed)
Left a detailed msg concerning upcoming appointment with [facility]low in 6 mon. Per 1/7 los. Mailed a letter with a calender enclosed

## 2018-10-03 NOTE — Progress Notes (Signed)
[facility] at [place], [place] [address] [phone]   Interval Evaluation  Date of Service: 10/03/18 Patient Name: [name] Patient MRN: [id number] Patient DOB: [birth year] Provider: Ventura Sellers, MD  Identifying Statement:  [name] is a 58 y.o. male with seizures  Primary Cancer:  Oncologic History:   Non-small cell carcinoma of right lung, stage 3 (HCC)   03/22/2018 Initial Diagnosis    Non-small cell carcinoma of right lung, stage 3 ([place])    04/03/2018 - 05/21/2018 Chemotherapy    The patient had palonosetron (ALOXI) injection 0.25 mg, 0.25 mg, Intravenous,  Once, 6 of 6 cycles Administration: 0.25 mg (04/03/2018), 0.25 mg (05/01/2018), 0.25 mg (05/15/2018), 0.25 mg (04/10/2018), 0.25 mg (04/17/2018), 0.25 mg (04/24/2018) CARBOplatin (PARAPLATIN) 300 mg in sodium chloride 0.9 % 250 mL chemo infusion, 300 mg (100 % of original dose 300 mg), Intravenous,  Once, 6 of 6 cycles Dose modification: 300 mg (original dose 300 mg, Cycle 1) Administration: 300 mg (04/03/2018), 300 mg (05/01/2018), 300 mg (05/15/2018), 300 mg (04/10/2018), 300 mg (04/17/2018), 300 mg (04/24/2018) PACLitaxel (TAXOL) 108 mg in sodium chloride 0.9 % 250 mL chemo infusion (</= 80mg /m2), 45 mg/m2 = 108 mg, Intravenous,  Once, 6 of 6 cycles Administration: 108 mg (04/03/2018), 108 mg (05/01/2018), 108 mg (05/15/2018), 108 mg (04/10/2018), 108 mg (04/17/2018), 108 mg (04/24/2018)  for chemotherapy treatment.     06/20/2018 -  Chemotherapy    The patient had durvalumab (IMFINZI) 1,000 mg in sodium chloride 0.9 % 100 mL chemo infusion, 1,040 mg, Intravenous,  Once, 8 of 19 cycles Administration: 1,000 mg (06/20/2018), 1,000 mg (09/12/2018), 1,000 mg (09/26/2018), 1,000 mg (07/04/2018), 1,000 mg (07/18/2018), 1,000 mg (08/01/2018), 1,000 mg (08/15/2018), 1,000 mg (08/29/2018)  for chemotherapy treatment.      Interval History:  [name] presents today for seizure follow up.  He  continues to describe stability/lack of seizures since starting Keppra.  Otherwise denies new or progressive neurologic deficits.  He presents today also to review MRI brain.  H+P: Patient presents to discuss recent episodes of loss of consciousness.  His wife describes events that begin as "eyes open, staring" and lead to "falling on floor shaking with eyes open for less than one minute" followed by difficulty arousing him for several minutes.  He returns to baseline after a few minutes.  This began occurring this summer (unclear exactly when) and the frequency is greater than once per week, up to 2x per day at its height.  He does describe a history of "staring spells as a boy, starting at age 70 and ending at age 45, precipitated by exertion or feeling winded".  These events have not recurred and he was not treated.  CT head was obtain which was unremarkable.  No other epilepsy risk factors.  He completed induction chemotherapy for lung cancer and is set to initiate immunotherapy with imfinzi.  Medications: Current Outpatient Medications on File Prior to Visit  Medication Sig Dispense Refill  . alprazolam (XANAX) 2 MG tablet Take 2 mg by mouth 4 (four) times daily as needed for anxiety.     [name] amitriptyline (ELAVIL) 100 MG tablet Take 100 mg by mouth at bedtime.     [name] aspirin 81 MG EC tablet Take 81 mg by mouth daily.      [name] atorvastatin (LIPITOR) 80 MG tablet Take 1 tablet (80 mg total) by mouth daily at 6 PM. 30 tablet 12  .  carvedilol (COREG) 6.25 MG tablet TAKE 1 TABLET BY MOUTH TWICE A DAY WITH A MEAL 60 tablet 8  . citalopram (CELEXA) 40 MG tablet Take 40 mg by mouth daily.  11  . furosemide (LASIX) 20 MG tablet Take 1 tablet (20 mg total) by mouth daily. 90 tablet 3  . HYDROcodone-acetaminophen (NORCO) 10-325 MG tablet Take 1 tablet by mouth every 4 (four) hours as needed for moderate pain.   0  . levETIRAcetam (KEPPRA) 750 MG tablet Take 1 tablet (750 mg total) by mouth 2 (two) times daily.  60 tablet 3  . Levothyroxine Sodium 50 MCG CAPS Take 2 capsules (100 mcg total) by mouth daily before breakfast. 30 capsule 1  . lidocaine-prilocaine (EMLA) cream Apply 1 application topically as needed. 30 g 0  . lisinopril (PRINIVIL,ZESTRIL) 2.5 MG tablet Take 1 tablet (2.5 mg total) by mouth daily. 30 tablet 12  . nitroGLYCERIN (NITROSTAT) 0.4 MG SL tablet Place 1 tablet (0.4 mg total) under the tongue every 5 (five) minutes x 3 doses as needed for chest pain. 25 tablet 2  . prochlorperazine (COMPAZINE) 10 MG tablet Take 1 tablet (10 mg total) by mouth every 6 (six) hours as needed for nausea or vomiting. 30 tablet 0  . spironolactone (ALDACTONE) 25 MG tablet TAKE 1 TABLET BY MOUTH EVERY DAY 90 tablet 3  . sucralfate (CARAFATE) 1 g tablet Take 1 tablet (1 g total) by mouth 4 (four) times daily -  with meals and at bedtime. 5 min before meals for radiation induced esophagitis 120 tablet 2  . tamsulosin (FLOMAX) 0.4 MG CAPS capsule Take 1 capsule by mouth daily.    . Tiotropium Bromide-Olodaterol (STIOLTO RESPIMAT) 2.5-2.5 MCG/ACT AERS Inhale 2 puffs into the lungs daily. 1 Inhaler 5  . umeclidinium-vilanterol (ANORO ELLIPTA) 62.5-25 MCG/INH AEPB Inhale 1 puff into the lungs daily. 1 each 0   No current facility-administered medications on file prior to visit.     Allergies:  Allergies  Allergen Reactions  . Lyrica [Pregabalin] Palpitations    Chest pain and heart palps.  . Neurontin [Gabapentin] Palpitations    Chest pain and heart palp   Past Medical History:  Past Medical History:  Diagnosis Date  . Acute hepatitis C virus infection 06/19/2007   Qualifier: Diagnosis of  By: Leilani Merl CMA, Tiffany    . Acute ST elevation myocardial infarction (STEMI) involving left anterior descending (LAD) coronary artery ([place]) 05/26/2016  . Acute ST elevation myocardial infarction (STEMI) of lateral wall ([place]) 05/26/2016  . 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 Anxiety   .  Asthma   . Atherosclerosis of native arteries of the extremities with intermittent claudication 12/16/2011  . Chronic hepatitis C without hepatic coma ([place]) 05/03/2014  . COPD with chronic bronchitis and emphysema ([place]) 03/13/2018   FEV1 57%  . Depression   . DVT (deep venous thrombosis) ([place]) 2009; 2019   RLE; LLE  . Encounter for antineoplastic chemotherapy 03/22/2018  . Hepatitis C    "tx'd in 2015"  . High cholesterol   . Ischemic cardiomyopathy 03/02/2018  . Non-small cell carcinoma of right lung, stage 3 ([place]) 03/22/2018  . PAD (peripheral artery disease) ([place]) 01/25/2013  . Peripheral vascular disease, unspecified 07/20/2012  . Port-A-Cath in place 04/24/2018  . ST elevation myocardial infarction involving left anterior descending (LAD) coronary artery ([place])   . STEMI (ST elevation myocardial infarction) ([place]) 05/26/2016  . Tobacco abuse 01/28/2016  . Tubular adenoma of colon 07/11/2014  . Urinary dribbling  Past Surgical History:  Past Surgical History:  Procedure Laterality Date  . AORTOGRAM  08/08/2009   for LLE claudication     By Dr. Oneida Alar  . 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 BIOPSY N/A 05/30/2014   Procedure: BIOPSY;  Surgeon: [name], MD;  Location: [facility];  Service: Endoscopy;  Laterality: N/A;  . BLADDER TUMOR EXCISION  8/812  . CARDIAC CATHETERIZATION N/A 05/26/2016   Procedure: Left Heart Cath and Coronary Angiography;  Surgeon: [name], MD;  Location: [facility];  Service: Cardiovascular;  Laterality: N/A;  . CARDIAC CATHETERIZATION N/A 05/26/2016   Procedure: Coronary Stent Intervention;  Surgeon: [name], MD;  Location: [facility];  Service: Cardiovascular;  Laterality: N/A;  . COLONOSCOPY WITH PROPOFOL N/A 05/30/2014   MVH:QIONGE colonic polyp-likely source of hematochezia-removed as described above  . ESOPHAGOGASTRODUODENOSCOPY (EGD) WITH PROPOFOL N/A 05/30/2014   XBM:WUXLKG and bulbar erosions s/p gastric biopsy. No evidence  of portal gastropathy on today's examination.  . FEMORAL-TIBIAL BYPASS GRAFT  2009   Right side using non-reversed GSV   By Dr. Oneida Alar  . FEMORAL-TIBIAL BYPASS GRAFT  11/24/2007   Right femoral to anterior tibial BPG   by Dr. Oneida Alar  . FINGER SURGERY Left    "straightened my pinky"  . HAND TENDON SURGERY Left 2013   Left 5th finger  . HERNIA REPAIR     "stomach"  . ICD IMPLANT N/A 03/02/2018   Procedure: ICD IMPLANT;  Surgeon: [name], MD;  Location: [facility];  Service: Cardiovascular;  Laterality: N/A;  . INCISIONAL HERNIA REPAIR N/A 12/25/2014   Procedure: HERNIA REPAIR INCISIONAL WITH MESH;  Surgeon: [name], MD;  Location: [facility];  Service: General;  Laterality: N/A;  . INSERTION OF MESH N/A 12/25/2014   Procedure: INSERTION OF MESH;  Surgeon: [name], MD;  Location: [facility];  Service: General;  Laterality: N/A;  . IR IMAGING GUIDED PORT INSERTION  04/11/2018  . LAPAROSCOPIC CHOLECYSTECTOMY    . LOWER EXTREMITY ANGIOGRAM Left 12/30/2015   Procedure: Lower Extremity Angiogram;  Surgeon: [name], MD;  Location: [facility];  Service: Cardiovascular;  Laterality: Left;  . PERIPHERAL VASCULAR CATHETERIZATION N/A 12/30/2015   Procedure: Abdominal Aortogram;  Surgeon: [name], MD;  Location: [facility];  Service: Cardiovascular;  Laterality: N/A;  . POLYPECTOMY  05/30/2014   Procedure: POLYPECTOMY;  Surgeon: [name], MD;  Location: [facility];  Service: Endoscopy;;  . TONSILLECTOMY AND ADENOIDECTOMY  ~ 1970  . TYMPANOSTOMY TUBE PLACEMENT Bilateral ~ 1970   Social History:  Social History   Socioeconomic History  . Marital status: Married    Spouse name: Not on file  . Number of children: Not on file  . Years of education: Not on file  . Highest education level: Not on file  Occupational History  . Not on file  Social Needs  . Financial resource strain: Not on file  . Food insecurity:    Worry: Not on file     Inability: Not on file  . Transportation needs:    Medical: Not on file    Non-medical: Not on file  Tobacco Use  . Smoking status: Current Every Day Smoker    Packs/day: 0.50    Years: 31.00    Pack years: 15.50    Types: Cigarettes  . Smokeless tobacco: Never Used  . Tobacco comment: half a pack/day now  Substance and Sexual Activity  . Alcohol use: Yes    Alcohol/week: 18.0 standard drinks    Types: 18 Cans of beer per week    Comment:  history of heavy ETOH use; 03/02/2018 "6-12 beers Fri & Sat"  . Drug use: Yes    Types: Marijuana    Comment: 03/02/2018 "weekly"  . Sexual activity: Not Currently  Lifestyle  . Physical activity:    Days per week: Not on file    Minutes per session: Not on file  . Stress: Not on file  Relationships  . Social connections:    Talks on phone: Not on file    Gets together: Not on file    Attends religious service: Not on file    Active member of club or organization: Not on file    Attends meetings of clubs or organizations: Not on file    Relationship status: Not on file  . Intimate partner violence:    Fear of current or ex partner: Not on file    Emotionally abused: Not on file    Physically abused: Not on file    Forced sexual activity: Not on file  Other Topics Concern  . Not on file  Social History Narrative  . Not on file   Family History:  Family History  Problem Relation Age of Onset  . Vision loss Mother   . Ulcers Father   . Colon cancer Neg Hx     Review of Systems: Constitutional: Denies fevers, chills or abnormal weight loss Eyes: Denies blurriness of vision Ears, nose, mouth, throat, and face: Denies mucositis or sore throat Respiratory: Denies cough, dyspnea or wheezes Cardiovascular: Denies palpitation, chest discomfort or lower extremity swelling Gastrointestinal:  Denies nausea, constipation, diarrhea GU: Denies dysuria or incontinence Skin: Denies abnormal skin rashes Neurological: Per HPI Musculoskeletal:  Denies joint pain, back or neck discomfort. No decrease in ROM Behavioral/Psych: Denies anxiety, disturbance in thought content, and mood instability   Physical Exam: Vitals:   10/03/18 1154  BP: 96/67  Pulse: 72  Resp: 17  Temp: 97.6 F (36.4 C)  SpO2: 97%   KPS: 70. General: Alert, cooperative, pleasant, in no acute distress in wheelchair Head: Craniotomy scar noted, dry and intact. EENT: No conjunctival injection or scleral icterus. Oral mucosa moist Lungs: Resp effort normal Cardiac: Regular rate and rhythm Abdomen: Soft, non-distended abdomen Skin: No rashes cyanosis or petechiae. Extremities: Right BKA  Neurologic Exam: Mental Status: Awake, alert, attentive to examiner. Oriented to self and environment. Language is fluent with intact comprehension.  Cranial Nerves: Visual acuity is grossly normal. Visual fields are full. Extra-ocular movements intact. No ptosis. Face is symmetric, tongue midline. Motor: Tone and bulk are normal. Power is full in both arms and legs. Reflexes are symmetric, no pathologic reflexes present. Intact finger to nose bilaterally Sensory: Intact to light touch and temperature Gait: Deferred  Labs: I have reviewed the data as listed    Component Value Date/Time   NA 140 09/26/2018 1039   NA 139 02/27/2018 1457   K 4.0 09/26/2018 1039   CL 99 09/26/2018 1039   CO2 32 09/26/2018 1039   GLUCOSE 77 09/26/2018 1039   BUN 7 09/26/2018 1039   BUN 7 02/27/2018 1457   CREATININE 1.18 09/26/2018 1039   CREATININE 1.13 06/28/2016 1453   CALCIUM 9.2 09/26/2018 1039   PROT 7.2 09/26/2018 1039   PROT 6.5 01/31/2018 1124   ALBUMIN 4.1 09/26/2018 1039   ALBUMIN 4.4 01/31/2018 1124  AST 24 09/26/2018 1039   ALT 27 09/26/2018 1039   ALKPHOS 95 09/26/2018 1039   BILITOT 0.3 09/26/2018 1039   GFRNONAA >60 09/26/2018 1039   GFRNONAA 74 06/28/2016 1453   GFRAA >60 09/26/2018 1039   GFRAA 85 06/28/2016 1453   Lab Results  Component Value Date   WBC  6.1 09/26/2018   NEUTROABS 4.4 09/26/2018   HGB 14.4 09/26/2018   HCT 44.4 09/26/2018   MCV 96.3 09/26/2018   PLT 153 09/26/2018    Imaging: CLINICAL DATA:  Recent episodes of loss of consciousness. History of lung cancer.  EXAM: MRI HEAD WITHOUT AND WITH CONTRAST  TECHNIQUE: Multiplanar, multiecho pulse sequences of the brain and surrounding structures were obtained without and with intravenous contrast.  CONTRAST:  10 mL Gadavist  COMPARISON:  Head CT 06/28/2018  FINDINGS: BRAIN: There is no acute infarct, acute hemorrhage, hydrocephalus or extra-axial collection. The midline structures are normal. No midline shift or other mass effect. There are no old infarcts. Minimal white matter hyperintensity, nonspecific and commonly seen in asymptomatic patients of this age. The cerebral and cerebellar volume are age-appropriate. Susceptibility-sensitive sequences show no chronic microhemorrhage or superficial siderosis. No abnormal contrast enhancement.  VASCULAR: Major intracranial arterial and venous sinus flow voids are normal.  SKULL AND UPPER CERVICAL SPINE: Calvarial bone marrow signal is normal. There is no skull base mass. Visualized upper cervical spine and soft tissues are normal.  SINUSES/ORBITS: Bilateral maxillary sinus retention cysts. Small amount of bilateral mastoid fluid. The orbits are normal.  IMPRESSION: Normal MRI of the brain.   Electronically Signed   By: Ulyses Jarred M.D.   On: 07/27/2018 16:42   Assessment/Plan 1. Seizures [facility])  Mr. Gleaves is clinically stable today.  There is no evidence of brain metastasis or other abnormality consistent with epileptic focus on MRI.  We recommended continuing Keppra 750mg  BID.    For MRI- his ICD and stents are labeled as MRI compliant according to information provided by the patient which was cross-referenced on [employer]'s website.  Case/order will need to be reviewed and cleared by  radiology.    We appreciate the opportunity to participate in the care of [place].  We will contact him by phone to relay MRI results.  All questions were answered. The patient knows to call the clinic with any problems, questions or concerns. No barriers to learning were detected.  The total time spent in the encounter was 25 minutes and more than 50% was on counseling and review of test results   Ventura Sellers, MD Medical Director of Neuro-Oncology [facility] 10/03/18 11:51 AM

## 2018-10-05 ENCOUNTER — Encounter: Payer: Self-pay | Admitting: *Deleted

## 2018-10-05 NOTE — Progress Notes (Signed)
Clarification on last office visit with Dr. Mickeal Skinner, no further MRI's of brain at this time since MRI was normal.

## 2018-10-10 ENCOUNTER — Encounter: Payer: Self-pay | Admitting: Internal Medicine

## 2018-10-10 ENCOUNTER — Inpatient Hospital Stay (HOSPITAL_BASED_OUTPATIENT_CLINIC_OR_DEPARTMENT_OTHER): Payer: Medicare Other | Admitting: Internal Medicine

## 2018-10-10 ENCOUNTER — Inpatient Hospital Stay: Payer: Medicare Other

## 2018-10-10 ENCOUNTER — Other Ambulatory Visit: Payer: Medicare Other

## 2018-10-10 ENCOUNTER — Telehealth: Payer: Self-pay

## 2018-10-10 VITALS — BP 95/75 | HR 74 | Temp 97.7°F | Resp 18 | Ht 73.0 in | Wt 231.4 lb

## 2018-10-10 DIAGNOSIS — C3491 Malignant neoplasm of unspecified part of right bronchus or lung: Secondary | ICD-10-CM

## 2018-10-10 DIAGNOSIS — Z79899 Other long term (current) drug therapy: Secondary | ICD-10-CM | POA: Diagnosis not present

## 2018-10-10 DIAGNOSIS — Z5112 Encounter for antineoplastic immunotherapy: Secondary | ICD-10-CM

## 2018-10-10 DIAGNOSIS — Z95828 Presence of other vascular implants and grafts: Secondary | ICD-10-CM

## 2018-10-10 DIAGNOSIS — C3411 Malignant neoplasm of upper lobe, right bronchus or lung: Secondary | ICD-10-CM

## 2018-10-10 DIAGNOSIS — R569 Unspecified convulsions: Secondary | ICD-10-CM | POA: Diagnosis not present

## 2018-10-10 DIAGNOSIS — Z720 Tobacco use: Secondary | ICD-10-CM | POA: Diagnosis not present

## 2018-10-10 DIAGNOSIS — Z9181 History of falling: Secondary | ICD-10-CM | POA: Diagnosis not present

## 2018-10-10 LAB — CMP (CANCER CENTER ONLY)
ALBUMIN: 4.4 g/dL (ref 3.5–5.0)
ALT: 62 U/L — ABNORMAL HIGH (ref 0–44)
AST: 43 U/L — ABNORMAL HIGH (ref 15–41)
Alkaline Phosphatase: 117 U/L (ref 38–126)
Anion gap: 9 (ref 5–15)
BUN: 14 mg/dL (ref 6–20)
CO2: 32 mmol/L (ref 22–32)
Calcium: 9.1 mg/dL (ref 8.9–10.3)
Chloride: 97 mmol/L — ABNORMAL LOW (ref 98–111)
Creatinine: 1.28 mg/dL — ABNORMAL HIGH (ref 0.61–1.24)
GFR, Est AFR Am: >60 mL/min (ref >60)
GFR, Estimated: >60 mL/min (ref >60)
GLUCOSE: 110 mg/dL — AB (ref 70–99)
Potassium: 3.8 mmol/L (ref 3.5–5.1)
Sodium: 138 mmol/L (ref 135–145)
Total Bilirubin: 0.4 mg/dL (ref 0.3–1.2)
Total Protein: 7.7 g/dL (ref 6.5–8.1)

## 2018-10-10 LAB — CBC WITH DIFFERENTIAL (CANCER CENTER ONLY)
Abs Immature Granulocytes: 0.07 10*3/uL (ref 0.00–0.07)
BASOS ABS: 0.1 10*3/uL (ref 0.0–0.1)
Basophils Relative: 1 %
Eosinophils Absolute: 0.6 10*3/uL — ABNORMAL HIGH (ref 0.0–0.5)
Eosinophils Relative: 8 %
HCT: 46 % (ref 39.0–52.0)
Hemoglobin: 15.2 g/dL (ref 13.0–17.0)
Immature Granulocytes: 1 %
Lymphocytes Relative: 10 %
Lymphs Abs: 0.7 10*3/uL (ref 0.7–4.0)
MCH: 31.4 pg (ref 26.0–34.0)
MCHC: 33 g/dL (ref 30.0–36.0)
MCV: 95 fL (ref 80.0–100.0)
Monocytes Absolute: 0.5 10*3/uL (ref 0.1–1.0)
Monocytes Relative: 6 %
NRBC: 0 % (ref 0.0–0.2)
Neutro Abs: 5.6 10*3/uL (ref 1.7–7.7)
Neutrophils Relative %: 74 %
Platelet Count: 180 10*3/uL (ref 150–400)
RBC: 4.84 MIL/uL (ref 4.22–5.81)
RDW: 13.8 % (ref 11.5–15.5)
WBC: 7.5 10*3/uL (ref 4.0–10.5)

## 2018-10-10 MED ORDER — SODIUM CHLORIDE 0.9 % IV SOLN
9.7000 mg/kg | Freq: Once | INTRAVENOUS | Status: AC
  Administered 2018-10-10: 1000 mg via INTRAVENOUS
  Filled 2018-10-10: qty 20

## 2018-10-10 MED ORDER — SODIUM CHLORIDE 0.9% FLUSH
10.0000 mL | INTRAVENOUS | Status: DC | PRN
  Administered 2018-10-10: 10 mL
  Filled 2018-10-10: qty 10

## 2018-10-10 MED ORDER — SODIUM CHLORIDE 0.9 % IV SOLN
Freq: Once | INTRAVENOUS | Status: AC
  Administered 2018-10-10: 10:00:00 via INTRAVENOUS
  Filled 2018-10-10: qty 250

## 2018-10-10 MED ORDER — HEPARIN SOD (PORK) LOCK FLUSH 100 UNIT/ML IV SOLN
500.0000 [IU] | Freq: Once | INTRAVENOUS | Status: AC | PRN
  Administered 2018-10-10: 500 [IU]
  Filled 2018-10-10: qty 5

## 2018-10-10 MED ORDER — SODIUM CHLORIDE 0.9% FLUSH
10.0000 mL | Freq: Once | INTRAVENOUS | Status: AC
  Administered 2018-10-10: 10 mL
  Filled 2018-10-10: qty 10

## 2018-10-10 NOTE — Telephone Encounter (Signed)
Printed avs and calender of upcoming appointment. Per 1/14 los

## 2018-10-10 NOTE — Progress Notes (Signed)
[facility] [phone]   Fax:([phone]) [phone]  OFFICE PROGRESS NOTE  [place], MD [place] [address]  DIAGNOSIS:stage IIIA (T3, N2, M0) non-small cell lung cancer, adenosquamous carcinoma diagnosed in June 2019 and presented with large right upper lobe lung mass with questionable chest wall invasion as well as right hilar and mediastinal lymphadenopathy.  Biomarker Findings Tumor Mutational Burden - TMB-High (21 Muts/Mb) Microsatellite status - MS-Stable Genomic Findings For a complete list of the genes assayed, please refer to the Appendix. ATM O6712* CCND2 P281R KRAS G12V CHEK2 T319f*15 TP53 E298* 7 Disease relevant genes with no reportable alterations: ALK, EGFR, BRAF, MET, RET, ERBB2, ROS1  PRIOR THERAPY:Concurrent chemoradiation with chemotherapy consisting of weekly carboplatin for an AUC of 2 and paclitaxel 45 mg/m2. First dose given on 04/03/2018.  Status post 6 cycles.  Last dose was giving 05/15/2018 with partial response.  CURRENT THERAPY:Consolidation treatment with immunotherapy with Imfinzi (Durvalumab) 10 mg/KG every 2 weeks.  First dose 06/20/2018.  Status post 8 cycles.  INTERVAL HISTORY: SDWAINE PRINGLE58y.o. male returns to the clinic today for follow-up visit accompanied by his wife.  The patient is feeling fine today with no concerning complaints.  He denied having any chest pain, shortness breath, cough or hemoptysis.  He denied having any fever or chills.  He has no nausea, vomiting, diarrhea or constipation.  He denied having any headache or visual changes.  He has been tolerating his treatment with Imfinzi fairly well.  The patient is here today for evaluation before starting cycle #8.  MEDICAL HISTORY: Past Medical History:  Diagnosis Date  . Acute hepatitis C virus infection 06/19/2007   Qualifier: Diagnosis of  By: MLeilani MerlCMA, Tiffany    . Acute ST elevation myocardial infarction (STEMI)  involving left anterior descending (LAD) coronary artery ([facility] 05/26/2016  . Acute ST elevation myocardial infarction (STEMI) of lateral wall ([facility] 05/26/2016  . 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 Anxiety   . Asthma   . Atherosclerosis of native arteries of the extremities with intermittent claudication 12/16/2011  . Chronic hepatitis C without hepatic coma ([facility] 05/03/2014  . COPD with chronic bronchitis and emphysema ([facility] 03/13/2018   FEV1 57%  . Depression   . DVT (deep venous thrombosis) ([facility] 2009; 2019   RLE; LLE  . Encounter for antineoplastic chemotherapy 03/22/2018  . Hepatitis C    "tx'd in 2015"  . High cholesterol   . Ischemic cardiomyopathy 03/02/2018  . Non-small cell carcinoma of right lung, stage 3 ([facility] 03/22/2018  . PAD (peripheral artery disease) ([facility] 01/25/2013  . Peripheral vascular disease, unspecified 07/20/2012  . Port-A-Cath in place 04/24/2018  . ST elevation myocardial infarction involving left anterior descending (LAD) coronary artery ([facility]   . STEMI (ST elevation myocardial infarction) ([facility] 05/26/2016  . Tobacco abuse 01/28/2016  . Tubular adenoma of colon 07/11/2014  . Urinary dribbling     ALLERGIES:  is allergic to lyrica [pregabalin] and neurontin [gabapentin].  MEDICATIONS:  Current Outpatient Medications  Medication Sig Dispense Refill  . alprazolam (XANAX) 2 MG tablet Take 2 mg by mouth 4 (four) times daily as needed for anxiety.     .Marland Kitchenamitriptyline (ELAVIL) 100 MG tablet Take 100 mg by mouth at bedtime.     .Marland Kitchenaspirin 81 MG EC tablet Take 81 mg by mouth daily.      .Marland Kitchenatorvastatin (LIPITOR) 80 MG tablet Take 1 tablet (80 mg total) by mouth  daily at 6 PM. 30 tablet 12  . carvedilol (COREG) 6.25 MG tablet TAKE 1 TABLET BY MOUTH TWICE A DAY WITH A MEAL 60 tablet 8  . citalopram (CELEXA) 40 MG tablet Take 40 mg by mouth daily.  11  . HYDROcodone-acetaminophen (NORCO) 10-325 MG tablet Take 1 tablet by mouth every 4 (four) hours as  needed for moderate pain.   0  . levETIRAcetam (KEPPRA) 750 MG tablet Take 1 tablet (750 mg total) by mouth 2 (two) times daily. 60 tablet 5  . Levothyroxine Sodium 50 MCG CAPS Take 2 capsules (100 mcg total) by mouth daily before breakfast. 30 capsule 1  . lidocaine-prilocaine (EMLA) cream Apply 1 application topically as needed. 30 g 0  . lisinopril (PRINIVIL,ZESTRIL) 2.5 MG tablet Take 1 tablet (2.5 mg total) by mouth daily. 30 tablet 12  . nitroGLYCERIN (NITROSTAT) 0.4 MG SL tablet Place 1 tablet (0.4 mg total) under the tongue every 5 (five) minutes x 3 doses as needed for chest pain. 25 tablet 2  . prochlorperazine (COMPAZINE) 10 MG tablet Take 1 tablet (10 mg total) by mouth every 6 (six) hours as needed for nausea or vomiting. 30 tablet 0  . spironolactone (ALDACTONE) 25 MG tablet TAKE 1 TABLET BY MOUTH EVERY DAY 90 tablet 3  . sucralfate (CARAFATE) 1 g tablet Take 1 tablet (1 g total) by mouth 4 (four) times daily -  with meals and at bedtime. 5 min before meals for radiation induced esophagitis 120 tablet 2  . tamsulosin (FLOMAX) 0.4 MG CAPS capsule Take 1 capsule by mouth daily.    . Tiotropium Bromide-Olodaterol (STIOLTO RESPIMAT) 2.5-2.5 MCG/ACT AERS Inhale 2 puffs into the lungs daily. 1 Inhaler 5  . umeclidinium-vilanterol (ANORO ELLIPTA) 62.5-25 MCG/INH AEPB Inhale 1 puff into the lungs daily. 1 each 0  . furosemide (LASIX) 20 MG tablet Take 1 tablet (20 mg total) by mouth daily. 90 tablet 3   No current facility-administered medications for this visit.     SURGICAL HISTORY:  Past Surgical History:  Procedure Laterality Date  . AORTOGRAM  08/08/2009   for LLE claudication     By Dr. Oneida Alar  . 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 BIOPSY N/A 05/30/2014   Procedure: BIOPSY;  Surgeon: [name], MD;  Location: [facility];  Service: Endoscopy;  Laterality: N/A;  . BLADDER TUMOR EXCISION  8/812  . CARDIAC CATHETERIZATION N/A 05/26/2016   Procedure: Left Heart Cath and  Coronary Angiography;  Surgeon: [name], MD;  Location: [facility];  Service: Cardiovascular;  Laterality: N/A;  . CARDIAC CATHETERIZATION N/A 05/26/2016   Procedure: Coronary Stent Intervention;  Surgeon: [name], MD;  Location: [facility];  Service: Cardiovascular;  Laterality: N/A;  . COLONOSCOPY WITH PROPOFOL N/A 05/30/2014   TXM:IWOEHO colonic polyp-likely source of hematochezia-removed as described above  . ESOPHAGOGASTRODUODENOSCOPY (EGD) WITH PROPOFOL N/A 05/30/2014   ZYY:QMGNOI and bulbar erosions s/p gastric biopsy. No evidence of portal gastropathy on today's examination.  . FEMORAL-TIBIAL BYPASS GRAFT  2009   Right side using non-reversed GSV   By Dr. Oneida Alar  . FEMORAL-TIBIAL BYPASS GRAFT  11/24/2007   Right femoral to anterior tibial BPG   by Dr. Oneida Alar  . FINGER SURGERY Left    "straightened my pinky"  . HAND TENDON SURGERY Left 2013   Left 5th finger  . HERNIA REPAIR     "stomach"  . ICD IMPLANT N/A 03/02/2018   Procedure: ICD IMPLANT;  Surgeon: [name], MD;  Location: [facility];  Service: Cardiovascular;  Laterality: N/A;  . INCISIONAL HERNIA REPAIR N/A 12/25/2014   Procedure: HERNIA REPAIR INCISIONAL WITH MESH;  Surgeon: [name], MD;  Location: [facility];  Service: General;  Laterality: N/A;  . INSERTION OF MESH N/A 12/25/2014   Procedure: INSERTION OF MESH;  Surgeon: [name], MD;  Location: [facility];  Service: General;  Laterality: N/A;  . IR IMAGING GUIDED PORT INSERTION  04/11/2018  . LAPAROSCOPIC CHOLECYSTECTOMY    . LOWER EXTREMITY ANGIOGRAM Left 12/30/2015   Procedure: Lower Extremity Angiogram;  Surgeon: [name], MD;  Location: [facility];  Service: Cardiovascular;  Laterality: Left;  . PERIPHERAL VASCULAR CATHETERIZATION N/A 12/30/2015   Procedure: Abdominal Aortogram;  Surgeon: [name], MD;  Location: [facility];  Service: Cardiovascular;  Laterality: N/A;  . POLYPECTOMY  05/30/2014    Procedure: POLYPECTOMY;  Surgeon: [name], MD;  Location: [facility];  Service: Endoscopy;;  . TONSILLECTOMY AND ADENOIDECTOMY  ~ 1970  . TYMPANOSTOMY TUBE PLACEMENT Bilateral ~ 1970    REVIEW OF SYSTEMS:  A comprehensive review of systems was negative.   PHYSICAL EXAMINATION: General appearance: alert, cooperative and no distress Head: Normocephalic, without obvious abnormality, atraumatic Neck: no adenopathy, no JVD, supple, symmetrical, trachea midline and thyroid not enlarged, symmetric, no tenderness/mass/nodules Lymph nodes: Cervical, supraclavicular, and axillary nodes normal. Resp: clear to auscultation bilaterally Back: symmetric, no curvature. ROM normal. No CVA tenderness. Cardio: regular rate and rhythm, S1, S2 normal, no murmur, click, rub or gallop GI: soft, non-tender; bowel sounds normal; no masses,  no organomegaly Extremities: Right below knee amputation otherwise no edema.  ECOG PERFORMANCE STATUS: 1 - Symptomatic but completely ambulatory  Blood pressure 95/75, pulse 74, temperature 97.7 F (36.5 C), temperature source Oral, resp. rate 18, height '6\' 1"'$  (1.854 m), weight 231 lb 6.4 oz (105 kg), SpO2 97 %.  LABORATORY DATA: Lab Results  Component Value Date   WBC 7.5 10/10/2018   HGB 15.2 10/10/2018   HCT 46.0 10/10/2018   MCV 95.0 10/10/2018   PLT 180 10/10/2018      Chemistry      Component Value Date/Time   NA 140 09/26/2018 1039   NA 139 02/27/2018 1457   K 4.0 09/26/2018 1039   CL 99 09/26/2018 1039   CO2 32 09/26/2018 1039   BUN 7 09/26/2018 1039   BUN 7 02/27/2018 1457   CREATININE 1.18 09/26/2018 1039   CREATININE 1.13 06/28/2016 1453      Component Value Date/Time   CALCIUM 9.2 09/26/2018 1039   ALKPHOS 95 09/26/2018 1039   AST 24 09/26/2018 1039   ALT 27 09/26/2018 1039   BILITOT 0.3 09/26/2018 1039       RADIOGRAPHIC STUDIES: Ct Chest W Contrast  Result Date: 09/18/2018 CLINICAL DATA:  Non-small-cell lung cancer. EXAM: CT  CHEST WITH CONTRAST TECHNIQUE: Multidetector CT imaging of the chest was performed during intravenous contrast administration. CONTRAST:  67m OMNIPAQUE IOHEXOL 300 MG/ML  SOLN COMPARISON:  06/09/2018 FINDINGS: Cardiovascular: The heart size is normal. No substantial pericardial effusion. Left permanent pacemaker again noted. Right Port-A-Cath tip is positioned in the lower right atrium. Mediastinum/Nodes: No mediastinal lymphadenopathy. No left hilar lymphadenopathy. Stable appearance of the right hilum. Similar appearance of circumferential wall thickening in the upper esophagus. Lungs/Pleura: The central tracheobronchial airways are patent. 6 x 4.2 cm right upper lobe mass measured on the prior study is  5.2 x 3.3 cm today. As before, the mass tracks into the superior right hilum. 3 mm posterior right upper lobe nodule (48/5) is new in the interval. Tiny 2-3 mm posterior right lower lobe nodules (57/5) are new since prior. 5 mm anterior left upper lobe nodule (71/5) is unchanged. Previously identified 10 mm left perifissural nodule (70/5) is stable 5 mm posterior left upper lobe nodule (47/5) is new since prior. No pulmonary edema. No pleural effusion. Upper Abdomen: Unremarkable. Musculoskeletal: No worrisome lytic or sclerotic osseous abnormality. IMPRESSION: 1. Interval decrease of right upper lobe mass now measuring 5.2 x 3.3 cm compared to 6.0 X 4.2 cm on the prior study. 2. Interval development of small bilateral pulmonary nodules. Attention on follow-up recommended. 3. Stable appearance of circumferential wall thickening upper esophagus. Electronically Signed   By: Misty Stanley M.D.   On: 09/18/2018 15:45    ASSESSMENT AND PLAN: This is a very pleasant 58 years old white male recently diagnosed with a stage IIIa non-small cell lung cancer, adenosquamous carcinoma.  He underwent a course of concurrent chemoradiation with weekly carboplatin and paclitaxel status post 6 cycles with partial response. The  patient is currently undergoing treatment with consolidation immunotherapy with Imfinzi status post 8 cycles. He has been tolerating this treatment well with no concerning adverse effects. I recommended for him to proceed with cycle #9 today as scheduled. I will see the patient back for follow-up visit in 2 weeks for evaluation before the next cycle of his treatment. He was advised to call immediately if he has any concerning symptoms in the interval. The patient voices understanding of current disease status and treatment options and is in agreement with the current care plan. All questions were answered. The patient knows to call the clinic with any problems, questions or concerns. We can certainly see the patient much sooner if necessary.  I spent 10 minutes counseling the patient face to face. The total time spent in the appointment was 15 minutes.  Disclaimer: This note was dictated with voice recognition software. Similar sounding words can inadvertently be transcribed and may not be corrected upon review.

## 2018-10-10 NOTE — Patient Instructions (Signed)
[facility] Discharge Instructions for Patients Receiving Chemotherapy  Today you received the following chemotherapy agents: Imfinzi.  To help prevent nausea and vomiting after your treatment, we encourage you to take your nausea medication as directed.   If you develop nausea and vomiting that is not controlled by your nausea medication, call the clinic.   BELOW ARE SYMPTOMS THAT SHOULD BE REPORTED IMMEDIATELY:  *FEVER GREATER THAN 100.5 F  *CHILLS WITH OR WITHOUT FEVER  NAUSEA AND VOMITING THAT IS NOT CONTROLLED WITH YOUR NAUSEA MEDICATION  *UNUSUAL SHORTNESS OF BREATH  *UNUSUAL BRUISING OR BLEEDING  TENDERNESS IN MOUTH AND THROAT WITH OR WITHOUT PRESENCE OF ULCERS  *URINARY PROBLEMS  *BOWEL PROBLEMS  UNUSUAL RASH Items with * indicate a potential emergency and should be followed up as soon as possible.  Feel free to call the clinic should you have any questions or concerns. The clinic phone number is [phone].  Please show the CHEMO ALERT CARD at check-in to the Emergency Department and triage nurse.   

## 2018-10-12 ENCOUNTER — Telehealth: Payer: Self-pay | Admitting: Internal Medicine

## 2018-10-12 NOTE — Telephone Encounter (Signed)
Called patient - left message - r/s 1/27 appt to earlier time to clear new patient slot

## 2018-10-15 LAB — CUP PACEART REMOTE DEVICE CHECK
Battery Remaining Longevity: 135 mo
Brady Statistic RV Percent Paced: 0 %
Date Time Interrogation Session: 20191210062203
HighPow Impedance: 75 Ohm
Implantable Lead Implant Date: 20190606
Implantable Lead Location: 753860
Lead Channel Impedance Value: 418 Ohm
Lead Channel Impedance Value: 513 Ohm
Lead Channel Pacing Threshold Amplitude: 0.75 V
Lead Channel Pacing Threshold Pulse Width: 0.4 ms
Lead Channel Sensing Intrinsic Amplitude: 24.375 mV
Lead Channel Sensing Intrinsic Amplitude: 24.375 mV
Lead Channel Setting Pacing Amplitude: 2.5 V
Lead Channel Setting Pacing Pulse Width: 0.4 ms
Lead Channel Setting Sensing Sensitivity: 0.3 mV
MDC IDC MSMT BATTERY VOLTAGE: 3.07 V
MDC IDC PG IMPLANT DT: 20190606

## 2018-10-23 ENCOUNTER — Encounter: Payer: Self-pay | Admitting: Internal Medicine

## 2018-10-23 ENCOUNTER — Ambulatory Visit: Payer: Medicare Other | Admitting: Internal Medicine

## 2018-10-23 ENCOUNTER — Inpatient Hospital Stay (HOSPITAL_BASED_OUTPATIENT_CLINIC_OR_DEPARTMENT_OTHER): Payer: Medicare Other | Admitting: Internal Medicine

## 2018-10-23 ENCOUNTER — Inpatient Hospital Stay: Payer: Medicare Other

## 2018-10-23 ENCOUNTER — Other Ambulatory Visit: Payer: Medicare Other

## 2018-10-23 VITALS — BP 103/70 | HR 72 | Temp 98.5°F | Resp 18 | Ht 73.0 in | Wt 232.5 lb

## 2018-10-23 DIAGNOSIS — Z720 Tobacco use: Secondary | ICD-10-CM | POA: Diagnosis not present

## 2018-10-23 DIAGNOSIS — Z5112 Encounter for antineoplastic immunotherapy: Secondary | ICD-10-CM

## 2018-10-23 DIAGNOSIS — Z95828 Presence of other vascular implants and grafts: Secondary | ICD-10-CM

## 2018-10-23 DIAGNOSIS — Z9181 History of falling: Secondary | ICD-10-CM | POA: Diagnosis not present

## 2018-10-23 DIAGNOSIS — Z79899 Other long term (current) drug therapy: Secondary | ICD-10-CM | POA: Diagnosis not present

## 2018-10-23 DIAGNOSIS — C3491 Malignant neoplasm of unspecified part of right bronchus or lung: Secondary | ICD-10-CM

## 2018-10-23 DIAGNOSIS — R569 Unspecified convulsions: Secondary | ICD-10-CM | POA: Diagnosis not present

## 2018-10-23 DIAGNOSIS — C3411 Malignant neoplasm of upper lobe, right bronchus or lung: Secondary | ICD-10-CM

## 2018-10-23 LAB — CBC WITH DIFFERENTIAL (CANCER CENTER ONLY)
Abs Immature Granulocytes: 0.12 10*3/uL — ABNORMAL HIGH (ref 0.00–0.07)
Basophils Absolute: 0.1 10*3/uL (ref 0.0–0.1)
Basophils Relative: 1 %
Eosinophils Absolute: 0.6 10*3/uL — ABNORMAL HIGH (ref 0.0–0.5)
Eosinophils Relative: 7 %
HCT: 43.2 % (ref 39.0–52.0)
Hemoglobin: 14.3 g/dL (ref 13.0–17.0)
Immature Granulocytes: 2 %
LYMPHS ABS: 0.8 10*3/uL (ref 0.7–4.0)
Lymphocytes Relative: 10 %
MCH: 31.2 pg (ref 26.0–34.0)
MCHC: 33.1 g/dL (ref 30.0–36.0)
MCV: 94.1 fL (ref 80.0–100.0)
MONO ABS: 0.7 10*3/uL (ref 0.1–1.0)
Monocytes Relative: 9 %
Neutro Abs: 5.8 10*3/uL (ref 1.7–7.7)
Neutrophils Relative %: 71 %
Platelet Count: 188 10*3/uL (ref 150–400)
RBC: 4.59 MIL/uL (ref 4.22–5.81)
RDW: 14.1 % (ref 11.5–15.5)
WBC Count: 8.1 10*3/uL (ref 4.0–10.5)
nRBC: 0 % (ref 0.0–0.2)

## 2018-10-23 LAB — CMP (CANCER CENTER ONLY)
ALT: 49 U/L — ABNORMAL HIGH (ref 0–44)
AST: 39 U/L (ref 15–41)
Albumin: 4.1 g/dL (ref 3.5–5.0)
Alkaline Phosphatase: 116 U/L (ref 38–126)
Anion gap: 7 (ref 5–15)
BUN: 9 mg/dL (ref 6–20)
CO2: 33 mmol/L — AB (ref 22–32)
Calcium: 9.2 mg/dL (ref 8.9–10.3)
Chloride: 98 mmol/L (ref 98–111)
Creatinine: 1.26 mg/dL — ABNORMAL HIGH (ref 0.61–1.24)
GFR, Est AFR Am: >60 mL/min (ref >60)
GFR, Estimated: >60 mL/min (ref >60)
Glucose, Bld: 84 mg/dL (ref 70–99)
Potassium: 4.3 mmol/L (ref 3.5–5.1)
Sodium: 138 mmol/L (ref 135–145)
Total Bilirubin: 0.4 mg/dL (ref 0.3–1.2)
Total Protein: 7.4 g/dL (ref 6.5–8.1)

## 2018-10-23 MED ORDER — HEPARIN SOD (PORK) LOCK FLUSH 100 UNIT/ML IV SOLN
500.0000 [IU] | Freq: Once | INTRAVENOUS | Status: AC
  Administered 2018-10-23: 500 [IU]
  Filled 2018-10-23: qty 5

## 2018-10-23 MED ORDER — HEPARIN SOD (PORK) LOCK FLUSH 100 UNIT/ML IV SOLN
500.0000 [IU] | Freq: Once | INTRAVENOUS | Status: AC | PRN
  Administered 2018-10-23: 500 [IU]
  Filled 2018-10-23: qty 5

## 2018-10-23 MED ORDER — SODIUM CHLORIDE 0.9% FLUSH
10.0000 mL | INTRAVENOUS | Status: DC | PRN
  Administered 2018-10-23: 10 mL
  Filled 2018-10-23: qty 10

## 2018-10-23 MED ORDER — SODIUM CHLORIDE 0.9 % IV SOLN
Freq: Once | INTRAVENOUS | Status: AC
  Administered 2018-10-23: 14:00:00 via INTRAVENOUS
  Filled 2018-10-23: qty 250

## 2018-10-23 MED ORDER — SODIUM CHLORIDE 0.9% FLUSH
10.0000 mL | Freq: Once | INTRAVENOUS | Status: AC
  Administered 2018-10-23: 10 mL
  Filled 2018-10-23: qty 10

## 2018-10-23 MED ORDER — SODIUM CHLORIDE 0.9 % IV SOLN
1000.0000 mg | Freq: Once | INTRAVENOUS | Status: AC
  Administered 2018-10-23: 1000 mg via INTRAVENOUS
  Filled 2018-10-23: qty 20

## 2018-10-23 NOTE — Progress Notes (Signed)
[name] Telephone:([phone]   Fax:(336) [phone]  OFFICE PROGRESS NOTE  [place], [place] [address]  DIAGNOSIS:stage IIIA (T3, N2, M0) non-small cell lung cancer, adenosquamous carcinoma diagnosed in June 2019 and presented with large right upper lobe lung mass with questionable chest wall invasion as well as right hilar and mediastinal lymphadenopathy.  Biomarker Findings Tumor Mutational Burden - TMB-High (21 Muts/Mb) Microsatellite status - MS-Stable Genomic Findings For a complete list of the genes assayed, please refer to the Appendix. ATM [id number]* CCND2 P281R KRAS G12V CHEK2 T347f*15 TP53 E298* 7 Disease relevant genes with no reportable alterations: ALK, EGFR, BRAF, MET, RET, ERBB2, ROS1  PRIOR THERAPY:Concurrent chemoradiation with chemotherapy consisting of weekly carboplatin for an AUC of 2 and paclitaxel 45 mg/m2. First dose given on 04/03/2018.  Status post 6 cycles.  Last dose was giving 05/15/2018 with partial response.  CURRENT THERAPY:Consolidation treatment with immunotherapy with Imfinzi (Durvalumab) 10 mg/KG every 2 weeks.  First dose 06/20/2018.  Status post 9 cycles.  INTERVAL HISTORY: SSTARR ENGEL58y.o. male returns to the clinic today for follow-up visit accompanied by his wife.  The patient is feeling fine today with no concerning complaints.  He denied having any chest pain, shortness of breath, cough or hemoptysis.  He denied having any fever or chills.  He has no nausea, vomiting, diarrhea or constipation.  He has no weight loss or night sweats.  He continues to tolerate his treatment with Imfinzi fairly well.  The patient is here today for evaluation before starting cycle #10.  MEDICAL HISTORY: Past Medical History:  Diagnosis Date  . Acute hepatitis C virus infection 06/19/2007   Qualifier: Diagnosis of  By: MLeilani MerlCMA, Tiffany    . Acute ST elevation myocardial infarction (STEMI) involving  left anterior descending (LAD) coronary artery ([facility] 05/26/2016  . Acute ST elevation myocardial infarction (STEMI) of lateral wall ([facility] 05/26/2016  . 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 Anxiety   . Asthma   . Atherosclerosis of native arteries of the extremities with intermittent claudication 12/16/2011  . Chronic hepatitis C without hepatic coma ([facility] 05/03/2014  . COPD with chronic bronchitis and emphysema ([facility] 03/13/2018   FEV1 57%  . Depression   . DVT (deep venous thrombosis) ([facility] 2009; 2019   RLE; LLE  . Encounter for antineoplastic chemotherapy 03/22/2018  . Hepatitis C    "tx'd in 2015"  . High cholesterol   . Ischemic cardiomyopathy 03/02/2018  . Non-small cell carcinoma of right lung, stage 3 ([facility] 03/22/2018  . PAD (peripheral artery disease) ([facility] 01/25/2013  . Peripheral vascular disease, unspecified 07/20/2012  . Port-A-Cath in place 04/24/2018  . ST elevation myocardial infarction involving left anterior descending (LAD) coronary artery ([facility]   . STEMI (ST elevation myocardial infarction) ([facility] 05/26/2016  . Tobacco abuse 01/28/2016  . Tubular adenoma of colon 07/11/2014  . Urinary dribbling     ALLERGIES:  is allergic to lyrica [pregabalin] and neurontin [gabapentin].  MEDICATIONS:  Current Outpatient Medications  Medication Sig Dispense Refill  . alprazolam (XANAX) 2 MG tablet Take 2 mg by mouth 4 (four) times daily as needed for anxiety.     .Marland Kitchenamitriptyline (ELAVIL) 100 MG tablet Take 100 mg by mouth at bedtime.     .Marland Kitchenaspirin 81 MG EC tablet Take 81 mg by mouth daily.      .Marland Kitchenatorvastatin (LIPITOR) 80 MG tablet Take 1 tablet (80 mg total) by  mouth daily at 6 PM. 30 tablet 12  . carvedilol (COREG) 6.25 MG tablet TAKE 1 TABLET BY MOUTH TWICE A DAY WITH A MEAL 60 tablet 8  . citalopram (CELEXA) 40 MG tablet Take 40 mg by mouth daily.  11  . furosemide (LASIX) 20 MG tablet Take 1 tablet (20 mg total) by mouth daily. 90 tablet 3  .  HYDROcodone-acetaminophen (NORCO) 10-325 MG tablet Take 1 tablet by mouth every 4 (four) hours as needed for moderate pain.   0  . levETIRAcetam (KEPPRA) 750 MG tablet Take 1 tablet (750 mg total) by mouth 2 (two) times daily. 60 tablet 5  . Levothyroxine Sodium 50 MCG CAPS Take 2 capsules (100 mcg total) by mouth daily before breakfast. 30 capsule 1  . lidocaine-prilocaine (EMLA) cream Apply 1 application topically as needed. 30 g 0  . lisinopril (PRINIVIL,ZESTRIL) 2.5 MG tablet Take 1 tablet (2.5 mg total) by mouth daily. 30 tablet 12  . nitroGLYCERIN (NITROSTAT) 0.4 MG SL tablet Place 1 tablet (0.4 mg total) under the tongue every 5 (five) minutes x 3 doses as needed for chest pain. 25 tablet 2  . prochlorperazine (COMPAZINE) 10 MG tablet Take 1 tablet (10 mg total) by mouth every 6 (six) hours as needed for nausea or vomiting. 30 tablet 0  . spironolactone (ALDACTONE) 25 MG tablet TAKE 1 TABLET BY MOUTH EVERY DAY 90 tablet 3  . sucralfate (CARAFATE) 1 g tablet Take 1 tablet (1 g total) by mouth 4 (four) times daily -  with meals and at bedtime. 5 min before meals for radiation induced esophagitis 120 tablet 2  . tamsulosin (FLOMAX) 0.4 MG CAPS capsule Take 1 capsule by mouth daily.    . Tiotropium Bromide-Olodaterol (STIOLTO RESPIMAT) 2.5-2.5 MCG/ACT AERS Inhale 2 puffs into the lungs daily. 1 Inhaler 5  . umeclidinium-vilanterol (ANORO ELLIPTA) 62.5-25 MCG/INH AEPB Inhale 1 puff into the lungs daily. 1 each 0   No current facility-administered medications for this visit.     SURGICAL HISTORY:  Past Surgical History:  Procedure Laterality Date  . AORTOGRAM  08/08/2009   for LLE claudication     By Dr. Oneida Alar  . 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 BIOPSY N/A 05/30/2014   Procedure: BIOPSY;  Surgeon: [name], MD;  Location: [facility];  Service: Endoscopy;  Laterality: N/A;  . BLADDER TUMOR EXCISION  8/812  . CARDIAC CATHETERIZATION N/A 05/26/2016   Procedure: Left Heart Cath  and Coronary Angiography;  Surgeon: [name], MD;  Location: [facility];  Service: Cardiovascular;  Laterality: N/A;  . CARDIAC CATHETERIZATION N/A 05/26/2016   Procedure: Coronary Stent Intervention;  Surgeon: [name], MD;  Location: [facility];  Service: Cardiovascular;  Laterality: N/A;  . COLONOSCOPY WITH PROPOFOL N/A 05/30/2014   YOV:ZCHYIF colonic polyp-likely source of hematochezia-removed as described above  . ESOPHAGOGASTRODUODENOSCOPY (EGD) WITH PROPOFOL N/A 05/30/2014   OYD:XAJOIN and bulbar erosions s/p gastric biopsy. No evidence of portal gastropathy on today's examination.  . FEMORAL-TIBIAL BYPASS GRAFT  2009   Right side using non-reversed GSV   By Dr. Oneida Alar  . FEMORAL-TIBIAL BYPASS GRAFT  11/24/2007   Right femoral to anterior tibial BPG   by Dr. Oneida Alar  . FINGER SURGERY Left    "straightened my pinky"  . HAND TENDON SURGERY Left 2013   Left 5th finger  . HERNIA REPAIR     "stomach"  . ICD IMPLANT N/A 03/02/2018   Procedure: ICD IMPLANT;  Surgeon: [name], MD;  Location: [facility];  Service: Cardiovascular;  Laterality: N/A;  . INCISIONAL HERNIA REPAIR N/A 12/25/2014   Procedure: HERNIA REPAIR INCISIONAL WITH MESH;  Surgeon: [name], MD;  Location: [facility];  Service: General;  Laterality: N/A;  . INSERTION OF MESH N/A 12/25/2014   Procedure: INSERTION OF MESH;  Surgeon: [name], MD;  Location: [facility];  Service: General;  Laterality: N/A;  . IR IMAGING GUIDED PORT INSERTION  04/11/2018  . LAPAROSCOPIC CHOLECYSTECTOMY    . LOWER EXTREMITY ANGIOGRAM Left 12/30/2015   Procedure: Lower Extremity Angiogram;  Surgeon: [name], MD;  Location: [facility];  Service: Cardiovascular;  Laterality: Left;  . PERIPHERAL VASCULAR CATHETERIZATION N/A 12/30/2015   Procedure: Abdominal Aortogram;  Surgeon: [name], MD;  Location: [facility];  Service: Cardiovascular;  Laterality: N/A;  . POLYPECTOMY   05/30/2014   Procedure: POLYPECTOMY;  Surgeon: [name], MD;  Location: [facility];  Service: Endoscopy;;  . TONSILLECTOMY AND ADENOIDECTOMY  ~ 1970  . TYMPANOSTOMY TUBE PLACEMENT Bilateral ~ 1970    REVIEW OF SYSTEMS:  A comprehensive review of systems was negative.   PHYSICAL EXAMINATION: General appearance: alert, cooperative and no distress Head: Normocephalic, without obvious abnormality, atraumatic Neck: no adenopathy, no JVD, supple, symmetrical, trachea midline and thyroid not enlarged, symmetric, no tenderness/mass/nodules Lymph nodes: Cervical, supraclavicular, and axillary nodes normal. Resp: clear to auscultation bilaterally Back: symmetric, no curvature. ROM normal. No CVA tenderness. Cardio: regular rate and rhythm, S1, S2 normal, no murmur, click, rub or gallop GI: soft, non-tender; bowel sounds normal; no masses,  no organomegaly Extremities: Right below knee amputation otherwise no edema.  ECOG PERFORMANCE STATUS: 1 - Symptomatic but completely ambulatory  Blood pressure 103/70, pulse 72, temperature 98.5 F (36.9 C), temperature source Oral, resp. rate 18, height '6\' 1"'$  (1.854 m), weight 232 lb 8 oz (105.5 kg), SpO2 98 %.  LABORATORY DATA: Lab Results  Component Value Date   WBC 8.1 10/23/2018   HGB 14.3 10/23/2018   HCT 43.2 10/23/2018   MCV 94.1 10/23/2018   PLT 188 10/23/2018      Chemistry      Component Value Date/Time   NA 138 10/10/2018 0900   NA 139 02/27/2018 1457   K 3.8 10/10/2018 0900   CL 97 (L) 10/10/2018 0900   CO2 32 10/10/2018 0900   BUN 14 10/10/2018 0900   BUN 7 02/27/2018 1457   CREATININE 1.28 (H) 10/10/2018 0900   CREATININE 1.13 06/28/2016 1453      Component Value Date/Time   CALCIUM 9.1 10/10/2018 0900   ALKPHOS 117 10/10/2018 0900   AST 43 (H) 10/10/2018 0900   ALT 62 (H) 10/10/2018 0900   BILITOT 0.4 10/10/2018 0900       RADIOGRAPHIC STUDIES: No results found.  ASSESSMENT AND PLAN: This is a very pleasant 59 years  old white male recently diagnosed with a stage IIIa non-small cell lung cancer, adenosquamous carcinoma.  He underwent a course of concurrent chemoradiation with weekly carboplatin and paclitaxel status post 6 cycles with partial response. The patient is currently undergoing treatment with consolidation immunotherapy with Imfinzi status post 9 cycles. He continues to tolerate this treatment well with no concerning complaints. I recommended for him to proceed with cycle #10 today. I will see him back for follow-up visit in 2 weeks for evaluation before the next cycle of his treatment. The patient was advised to call  immediately if he has any concerning symptoms in the interval. The patient voices understanding of current disease status and treatment options and is in agreement with the current care plan. All questions were answered. The patient knows to call the clinic with any problems, questions or concerns. We can certainly see the patient much sooner if necessary. I spent 10 minutes counseling the patient face to face. The total time spent in the appointment was 15 minutes.  Disclaimer: This note was dictated with voice recognition software. Similar sounding words can inadvertently be transcribed and may not be corrected upon review.

## 2018-10-23 NOTE — Patient Instructions (Signed)
[facility] Discharge Instructions for Patients Receiving Chemotherapy  Today you received the following chemotherapy agents: Durvalumab (Imfinzi)   To help prevent nausea and vomiting after your treatment, we encourage you to take your nausea medication as directed.   If you develop nausea and vomiting that is not controlled by your nausea medication, call the clinic.   BELOW ARE SYMPTOMS THAT SHOULD BE REPORTED IMMEDIATELY:  *FEVER GREATER THAN 100.5 F  *CHILLS WITH OR WITHOUT FEVER  NAUSEA AND VOMITING THAT IS NOT CONTROLLED WITH YOUR NAUSEA MEDICATION  *UNUSUAL SHORTNESS OF BREATH  *UNUSUAL BRUISING OR BLEEDING  TENDERNESS IN MOUTH AND THROAT WITH OR WITHOUT PRESENCE OF ULCERS  *URINARY PROBLEMS  *BOWEL PROBLEMS  UNUSUAL RASH Items with * indicate a potential emergency and should be followed up as soon as possible.  Feel free to call the clinic should you have any questions or concerns. The clinic phone number is [phone].  Please show the CHEMO ALERT CARD at check-in to the Emergency Department and triage nurse.   

## 2018-10-24 ENCOUNTER — Telehealth: Payer: Self-pay | Admitting: Internal Medicine

## 2018-10-24 ENCOUNTER — Inpatient Hospital Stay: Payer: Medicare Other

## 2018-10-24 LAB — TSH: TSH: 106.455 u[IU]/mL — ABNORMAL HIGH (ref 0.320–4.118)

## 2018-10-24 NOTE — Telephone Encounter (Signed)
2 cycles already scheduled per 1/27 los - no additional appts added yet due to MD availability.

## 2018-10-25 ENCOUNTER — Other Ambulatory Visit: Payer: Self-pay | Admitting: Internal Medicine

## 2018-10-30 ENCOUNTER — Ambulatory Visit: Payer: Medicare Other | Admitting: Pulmonary Disease

## 2018-10-30 DIAGNOSIS — G894 Chronic pain syndrome: Secondary | ICD-10-CM | POA: Diagnosis not present

## 2018-10-31 ENCOUNTER — Ambulatory Visit: Payer: Medicare Other | Admitting: Pulmonary Disease

## 2018-11-03 ENCOUNTER — Telehealth: Payer: Self-pay | Admitting: Internal Medicine

## 2018-11-03 NOTE — Telephone Encounter (Signed)
MM PM PLA 2/11 & 2/25 - moved appointments to earlier in the day. Left message for patient on both home/cell phone.

## 2018-11-07 ENCOUNTER — Other Ambulatory Visit: Payer: Medicare Other

## 2018-11-07 ENCOUNTER — Inpatient Hospital Stay: Payer: Medicare Other | Attending: Internal Medicine | Admitting: Adult Health

## 2018-11-07 ENCOUNTER — Inpatient Hospital Stay: Payer: Medicare Other

## 2018-11-07 ENCOUNTER — Encounter: Payer: Self-pay | Admitting: Adult Health

## 2018-11-07 VITALS — BP 88/59 | HR 63 | Temp 98.2°F | Resp 18 | Ht 73.0 in | Wt 242.2 lb

## 2018-11-07 DIAGNOSIS — Z720 Tobacco use: Secondary | ICD-10-CM

## 2018-11-07 DIAGNOSIS — Z79899 Other long term (current) drug therapy: Secondary | ICD-10-CM | POA: Insufficient documentation

## 2018-11-07 DIAGNOSIS — C3411 Malignant neoplasm of upper lobe, right bronchus or lung: Secondary | ICD-10-CM

## 2018-11-07 DIAGNOSIS — Z5112 Encounter for antineoplastic immunotherapy: Secondary | ICD-10-CM | POA: Insufficient documentation

## 2018-11-07 DIAGNOSIS — I959 Hypotension, unspecified: Secondary | ICD-10-CM

## 2018-11-07 DIAGNOSIS — C3491 Malignant neoplasm of unspecified part of right bronchus or lung: Secondary | ICD-10-CM

## 2018-11-07 DIAGNOSIS — Z95828 Presence of other vascular implants and grafts: Secondary | ICD-10-CM

## 2018-11-07 LAB — CMP (CANCER CENTER ONLY)
ALBUMIN: 4.1 g/dL (ref 3.5–5.0)
ALT: 34 U/L (ref 0–44)
ANION GAP: 8 (ref 5–15)
AST: 31 U/L (ref 15–41)
Alkaline Phosphatase: 101 U/L (ref 38–126)
BUN: 9 mg/dL (ref 6–20)
CO2: 33 mmol/L — ABNORMAL HIGH (ref 22–32)
Calcium: 8.9 mg/dL (ref 8.9–10.3)
Chloride: 97 mmol/L — ABNORMAL LOW (ref 98–111)
Creatinine: 1.28 mg/dL — ABNORMAL HIGH (ref 0.61–1.24)
GFR, Est AFR Am: >60 mL/min (ref >60)
GFR, Estimated: >60 mL/min (ref >60)
Glucose, Bld: 68 mg/dL — ABNORMAL LOW (ref 70–99)
Potassium: 4.3 mmol/L (ref 3.5–5.1)
Sodium: 138 mmol/L (ref 135–145)
Total Bilirubin: 0.4 mg/dL (ref 0.3–1.2)
Total Protein: 7 g/dL (ref 6.5–8.1)

## 2018-11-07 LAB — CBC WITH DIFFERENTIAL (CANCER CENTER ONLY)
Abs Immature Granulocytes: 0.09 10*3/uL — ABNORMAL HIGH (ref 0.00–0.07)
BASOS PCT: 1 %
Basophils Absolute: 0.1 10*3/uL (ref 0.0–0.1)
Eosinophils Absolute: 0.5 10*3/uL (ref 0.0–0.5)
Eosinophils Relative: 6 %
HCT: 41.5 % (ref 39.0–52.0)
Hemoglobin: 13.3 g/dL (ref 13.0–17.0)
Immature Granulocytes: 1 %
Lymphocytes Relative: 11 %
Lymphs Abs: 0.7 10*3/uL (ref 0.7–4.0)
MCH: 30.9 pg (ref 26.0–34.0)
MCHC: 32 g/dL (ref 30.0–36.0)
MCV: 96.5 fL (ref 80.0–100.0)
MONOS PCT: 7 %
Monocytes Absolute: 0.5 10*3/uL (ref 0.1–1.0)
NEUTROS PCT: 74 %
Neutro Abs: 5.2 10*3/uL (ref 1.7–7.7)
Platelet Count: 158 10*3/uL (ref 150–400)
RBC: 4.3 MIL/uL (ref 4.22–5.81)
RDW: 14.6 % (ref 11.5–15.5)
WBC Count: 7 10*3/uL (ref 4.0–10.5)
nRBC: 0 % (ref 0.0–0.2)

## 2018-11-07 MED ORDER — SODIUM CHLORIDE 0.9% FLUSH
10.0000 mL | INTRAVENOUS | Status: DC | PRN
  Administered 2018-11-07: 10 mL
  Filled 2018-11-07: qty 10

## 2018-11-07 MED ORDER — SODIUM CHLORIDE 0.9 % IV SOLN
Freq: Once | INTRAVENOUS | Status: AC
  Administered 2018-11-07: 14:00:00 via INTRAVENOUS
  Filled 2018-11-07: qty 250

## 2018-11-07 MED ORDER — SODIUM CHLORIDE 0.9% FLUSH
10.0000 mL | Freq: Once | INTRAVENOUS | Status: AC
  Administered 2018-11-07: 10 mL
  Filled 2018-11-07: qty 10

## 2018-11-07 MED ORDER — HEPARIN SOD (PORK) LOCK FLUSH 100 UNIT/ML IV SOLN
500.0000 [IU] | Freq: Once | INTRAVENOUS | Status: AC | PRN
  Administered 2018-11-07: 500 [IU]
  Filled 2018-11-07: qty 5

## 2018-11-07 MED ORDER — SODIUM CHLORIDE 0.9 % IV SOLN
9.6000 mg/kg | Freq: Once | INTRAVENOUS | Status: AC
  Administered 2018-11-07: 1000 mg via INTRAVENOUS
  Filled 2018-11-07: qty 20

## 2018-11-07 NOTE — Assessment & Plan Note (Addendum)
[name] is a 58 year old male with stage IIIA non small cell lung cancer diagnosed in 02/2018.    BIOMARKER FINDINGS: Tumor Mutational Burden - TMB-High (21 Muts/Mb) Microsatellite status - MS-Stable Genomic Findings For a complete list of the genes assayed, please refer to the Appendix. ATM [phone]* CCND2 P281R KRAS G12V CHEK2 T344f*15 TP53 E298* 7 Disease relevant genes with no reportable alterations: ALK, EGFR, BRAF, MET, RET, ERBB2, ROS1  TREATMENT:  Concurrent radiation and Paclitaxel/Carboplatin 04/03/2018-05/21/2018 Consolidation with Imfinzi started 06/20/2018  Most recent CT chest on 09/18/2018 shows improvement in disease response with improvement in right upper lobe mass from 6 x 4.2 cm to 5.2 x 3.3 cm.    _____________________________________________________________________________________  Mark Reaperis doing well today. He continues on Imfinzi every 2 weeks and is tolerating it well.  He will continue this.  His blood pressure is borderline low, however he is asymptomatic and is on several cardiac meds due to his extensive cardiac history.    He will return in two weeks for labs and f/u with Dr. MJulien Nordmann

## 2018-11-07 NOTE — Patient Instructions (Signed)
[facility] Discharge Instructions for Patients Receiving Chemotherapy  Today you received the following chemotherapy agents: Durvalumab (Imfinzi)   To help prevent nausea and vomiting after your treatment, we encourage you to take your nausea medication as directed.   If you develop nausea and vomiting that is not controlled by your nausea medication, call the clinic.   BELOW ARE SYMPTOMS THAT SHOULD BE REPORTED IMMEDIATELY:  *FEVER GREATER THAN 100.5 F  *CHILLS WITH OR WITHOUT FEVER  NAUSEA AND VOMITING THAT IS NOT CONTROLLED WITH YOUR NAUSEA MEDICATION  *UNUSUAL SHORTNESS OF BREATH  *UNUSUAL BRUISING OR BLEEDING  TENDERNESS IN MOUTH AND THROAT WITH OR WITHOUT PRESENCE OF ULCERS  *URINARY PROBLEMS  *BOWEL PROBLEMS  UNUSUAL RASH Items with * indicate a potential emergency and should be followed up as soon as possible.  Feel free to call the clinic should you have any questions or concerns. The clinic phone number is [phone].  Please show the CHEMO ALERT CARD at check-in to the Emergency Department and triage nurse.   

## 2018-11-07 NOTE — Progress Notes (Signed)
[facility] Follow up:    Mark School, MD [address] [place] [address]   DIAGNOSIS: Stage IIIA NSCLC  BIOMARKER FINDINGS: Tumor Mutational Burden - TMB-High (21 Muts/Mb) Microsatellite status - MS-Stable Genomic Findings For a complete list of the genes assayed, please refer to the Appendix. ATM [id number]* CCND2 P281R KRAS G12V CHEK2 T328f*15 TP53 E298* 7 Disease relevant genes with no reportable alterations: ALK, EGFR, BRAF, MET, RET, ERBB2, ROS1  SUMMARY OF ONCOLOGIC HISTORY:   Non-small cell carcinoma of right lung, stage 3 (HD[place]   03/22/2018 Initial Diagnosis    Non-small cell carcinoma of right lung, stage 3 (HChap[place]    04/03/2018 - 05/21/2018 Chemotherapy    Given with concurrent radiation. The patient had palonosetron (ALOXI) injection 0.25 mg, 0.25 mg, Intravenous,  Once, 6 of 6 cycles Administration: 0.25 mg (04/03/2018), 0.25 mg (05/01/2018), 0.25 mg (05/15/2018), 0.25 mg (04/10/2018), 0.25 mg (04/17/2018), 0.25 mg (04/24/2018) CARBOplatin (PARAPLATIN) 300 mg in sodium chloride 0.9 % 250 mL chemo infusion, 300 mg (100 % of original dose 300 mg), Intravenous,  Once, 6 of 6 cycles Dose modification: 300 mg (original dose 300 mg, Cycle 1) Administration: 300 mg (04/03/2018), 300 mg (05/01/2018), 300 mg (05/15/2018), 300 mg (04/10/2018), 300 mg (04/17/2018), 300 mg (04/24/2018) PACLitaxel (TAXOL) 108 mg in sodium chloride 0.9 % 250 mL chemo infusion (</= 823mm2), 45 mg/m2 = 108 mg, Intravenous,  Once, 6 of 6 cycles Administration: 108 mg (04/03/2018), 108 mg (05/01/2018), 108 mg (05/15/2018), 108 mg (04/10/2018), 108 mg (04/17/2018), 108 mg (04/24/2018)  for chemotherapy treatment.     06/20/2018 -  Chemotherapy    The patient had durvalumab (IMFINZI) 1,000 mg in sodium chloride 0.9 % 100 mL chemo infusion, 1,040 mg, Intravenous,  Once, 10 of 19 cycles Administration: 1,000 mg (06/20/2018), 1,000 mg (09/12/2018), 1,000 mg (09/26/2018), 1,000 mg (10/10/2018), 1,000 mg  (10/23/2018), 1,000 mg (07/04/2018), 1,000 mg (07/18/2018), 1,000 mg (08/01/2018), 1,000 mg (08/15/2018), 1,000 mg (08/29/2018)  for chemotherapy treatment.      CURRENT THERAPY: Imfinzi consolidation   INTERVAL HISTORY: Mark REISZ89.o. male returns for evaluation prior to receiving Imfinzi every 2 weeks.  He is here for cycle 11 of his treatment.  He is feeling well.  He is occasionally constipated and this resolves with colace.  His blood pressure is slightly low today, but he notes that his bp is frequently this low.     Patient Active Problem List   Diagnosis Date Noted  . Seizures (HCCokato10/12/2017  . Seizure-like activity (HCHamilton09/30/2019  . Encounter for antineoplastic immunotherapy 06/12/2018  . Port-A-Cath in place 04/24/2018  . Non-small cell carcinoma of right lung, stage 3 (HCFayetteville06/26/2019  . Goals of care, counseling/discussion 03/22/2018  . Encounter for antineoplastic chemotherapy 03/22/2018  . COPD with chronic bronchitis and emphysema (HCRandolph06/17/2019  . Mass of upper lobe of right lung 03/08/2018  . Ischemic cardiomyopathy 03/02/2018  . Medication management 07/09/2016  . Acute ST elevation myocardial infarction (STEMI) of lateral wall (HCHeard08/30/2017  . Acute ST elevation myocardial infarction (STEMI) involving left anterior descending (LAD) coronary artery (HCSouthmont08/30/2017  . ST elevation myocardial infarction involving left anterior descending (LAD) coronary artery (HCElverson  . Tobacco abuse 01/28/2016  . Tubular adenoma of colon 07/11/2014  . Chronic hepatitis C without hepatic coma (HCCarpinteria08/03/2014  . Encounter for screening colonoscopy 05/03/2014  . PAD (peripheral artery disease) (HCWade05/09/2012  . Peripheral vascular disease, unspecified 07/20/2012  . Atherosclerosis of  native arteries of the extremities with intermittent claudication 12/16/2011  . Acute hepatitis C virus infection 06/19/2007  . SEBORRHEIC KERATOSIS 06/13/2007  . BLOOD CHEMISTRY, ABNORMAL  06/13/2007  . 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 ANXIETY DISORDER, GENERALIZED 05/30/2007  . LEG PAIN, RIGHT 11/26/2003    is allergic to lyrica [pregabalin] and neurontin [gabapentin].  MEDICAL HISTORY: Past Medical History:  Diagnosis Date  . Acute hepatitis C virus infection 06/19/2007   Qualifier: Diagnosis of  By: Leilani Merl CMA, Tiffany    . Acute ST elevation myocardial infarction (STEMI) involving left anterior descending (LAD) coronary artery ([place]) 05/26/2016  . Acute ST elevation myocardial infarction (STEMI) of lateral wall ([place]) 05/26/2016  . 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 Anxiety   . Asthma   . Atherosclerosis of native arteries of the extremities with intermittent claudication 12/16/2011  . Chronic hepatitis C without hepatic coma ([place]) 05/03/2014  . COPD with chronic bronchitis and emphysema ([place]) 03/13/2018   FEV1 57%  . Depression   . DVT (deep venous thrombosis) ([place]) 2009; 2019   RLE; LLE  . Encounter for antineoplastic chemotherapy 03/22/2018  . Hepatitis C    "tx'd in 2015"  . High cholesterol   . Ischemic cardiomyopathy 03/02/2018  . Non-small cell carcinoma of right lung, stage 3 ([place]) 03/22/2018  . PAD (peripheral artery disease) ([place]) 01/25/2013  . Peripheral vascular disease, unspecified 07/20/2012  . Port-A-Cath in place 04/24/2018  . ST elevation myocardial infarction involving left anterior descending (LAD) coronary artery ([place])   . STEMI (ST elevation myocardial infarction) ([place]) 05/26/2016  . Tobacco abuse 01/28/2016  . Tubular adenoma of colon 07/11/2014  . Urinary dribbling     SURGICAL HISTORY: Past Surgical History:  Procedure Laterality Date  . AORTOGRAM  08/08/2009   for LLE claudication     By Dr. Oneida Alar  . 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 BIOPSY N/A 05/30/2014   Procedure: BIOPSY;  Surgeon: [name], MD;  Location: [facility];  Service: Endoscopy;  Laterality: N/A;  . BLADDER TUMOR EXCISION  8/812  .  CARDIAC CATHETERIZATION N/A 05/26/2016   Procedure: Left Heart Cath and Coronary Angiography;  Surgeon: [name], MD;  Location: [facility];  Service: Cardiovascular;  Laterality: N/A;  . CARDIAC CATHETERIZATION N/A 05/26/2016   Procedure: Coronary Stent Intervention;  Surgeon: [name], MD;  Location: [facility];  Service: Cardiovascular;  Laterality: N/A;  . COLONOSCOPY WITH PROPOFOL N/A 05/30/2014   YBO:FBPZWC colonic polyp-likely source of hematochezia-removed as described above  . ESOPHAGOGASTRODUODENOSCOPY (EGD) WITH PROPOFOL N/A 05/30/2014   HEN:IDPOEU and bulbar erosions s/p gastric biopsy. No evidence of portal gastropathy on today's examination.  . FEMORAL-TIBIAL BYPASS GRAFT  2009   Right side using non-reversed GSV   By Dr. Oneida Alar  . FEMORAL-TIBIAL BYPASS GRAFT  11/24/2007   Right femoral to anterior tibial BPG   by Dr. Oneida Alar  . FINGER SURGERY Left    "straightened my pinky"  . HAND TENDON SURGERY Left 2013   Left 5th finger  . HERNIA REPAIR     "stomach"  . ICD IMPLANT N/A 03/02/2018   Procedure: ICD IMPLANT;  Surgeon: [name], MD;  Location: [facility];  Service: Cardiovascular;  Laterality: N/A;  . INCISIONAL HERNIA REPAIR N/A 12/25/2014   Procedure: HERNIA REPAIR INCISIONAL WITH MESH;  Surgeon: [name], MD;  Location: [facility];  Service: General;  Laterality: N/A;  . INSERTION OF MESH N/A 12/25/2014  Procedure: INSERTION OF MESH;  Surgeon: [name], MD;  Location: [facility];  Service: General;  Laterality: N/A;  . IR IMAGING GUIDED PORT INSERTION  04/11/2018  . LAPAROSCOPIC CHOLECYSTECTOMY    . LOWER EXTREMITY ANGIOGRAM Left 12/30/2015   Procedure: Lower Extremity Angiogram;  Surgeon: [name], MD;  Location: [facility];  Service: Cardiovascular;  Laterality: Left;  . PERIPHERAL VASCULAR CATHETERIZATION N/A 12/30/2015   Procedure: Abdominal Aortogram;  Surgeon: [name], MD;  Location: [facility];  Service: Cardiovascular;  Laterality: N/A;  . POLYPECTOMY  05/30/2014   Procedure: POLYPECTOMY;  Surgeon: [name], MD;  Location: [facility];  Service: Endoscopy;;  . TONSILLECTOMY AND ADENOIDECTOMY  ~ 1970  . TYMPANOSTOMY TUBE PLACEMENT Bilateral ~ 1970    SOCIAL HISTORY: Social History   Socioeconomic History  . Marital status: Married    Spouse name: Not on file  . Number of children: Not on file  . Years of education: Not on file  . Highest education level: Not on file  Occupational History  . Not on file  Social Needs  . Financial resource strain: Not on file  . Food insecurity:    Worry: Not on file    Inability: Not on file  . Transportation needs:    Medical: Not on file    Non-medical: Not on file  Tobacco Use  . Smoking status: Current Every Day Smoker    Packs/day: 0.50    Years: 31.00    Pack years: 15.50    Types: Cigarettes  . Smokeless tobacco: Never Used  . Tobacco comment: half a pack/day now  Substance and Sexual Activity  . Alcohol use: Yes    Alcohol/week: 18.0 standard drinks    Types: 18 Cans of beer per week    Comment:  history of heavy ETOH use; 03/02/2018 "6-12 beers Fri & Sat"  . Drug use: Yes    Types: Marijuana    Comment: 03/02/2018 "weekly"  . Sexual activity: Not Currently  Lifestyle  . Physical activity:    Days per week: Not on file    Minutes per session: Not on file  . Stress: Not on file  Relationships  . Social connections:    Talks on phone: Not on file    Gets together: Not on file    Attends religious service: Not on file    Active member of club or organization: Not on file    Attends meetings of clubs or organizations: Not on file    Relationship status: Not on file  . Intimate partner violence:    Fear of current or ex partner: Not on file    Emotionally abused: Not on file    Physically abused: Not on file    Forced sexual activity: Not on file  Other Topics Concern  . Not on file  Social History Narrative   . Not on file    FAMILY HISTORY: Family History  Problem Relation Age of Onset  . Vision loss Mother   . Ulcers Father   . Colon cancer Neg Hx     Review of Systems  Constitutional: Negative for appetite change, chills, fatigue, fever and unexpected weight change.  HENT:   Negative for hearing loss, lump/mass, sore throat and trouble swallowing.   Eyes: Negative for eye problems and icterus.  Respiratory: Negative for chest tightness, cough and shortness of breath.   Cardiovascular: Negative for chest pain, leg swelling and palpitations.  Gastrointestinal: Negative for abdominal distention, abdominal pain, constipation, diarrhea, nausea and vomiting.  Endocrine: Negative for hot flashes.  Genitourinary: Negative for difficulty urinating.   Musculoskeletal: Negative for arthralgias.  Skin: Negative for itching and rash.  Neurological: Negative for dizziness, extremity weakness, headaches and numbness.  Hematological: Negative for adenopathy. Does not bruise/bleed easily.  Psychiatric/Behavioral: Negative for depression. The patient is not nervous/anxious.       PHYSICAL EXAMINATION  ECOG PERFORMANCE STATUS: 1 - Symptomatic but completely ambulatory  Vitals:   11/07/18 1327  BP: (!) 88/59  Pulse: 63  Resp: 18  Temp: 98.2 F (36.8 C)  SpO2: 94%    Physical Exam Constitutional:      Appearance: Normal appearance. He is not toxic-appearing.  HENT:     Head: Normocephalic and atraumatic.     Mouth/Throat:     Mouth: Mucous membranes are moist.     Pharynx: Oropharynx is clear. No oropharyngeal exudate.  Eyes:     General: No scleral icterus.    Pupils: Pupils are equal, round, and reactive to light.  Neck:     Musculoskeletal: Neck supple.  Cardiovascular:     Rate and Rhythm: Normal rate and regular rhythm.     Pulses: Normal pulses.     Heart sounds: Normal heart sounds.  Pulmonary:     Effort: Pulmonary effort is normal.     Breath sounds: Normal breath  sounds.  Abdominal:     General: Abdomen is flat. There is no distension.     Tenderness: There is no abdominal tenderness.  Musculoskeletal:        General: No swelling.  Lymphadenopathy:     Cervical: No cervical adenopathy.  Skin:    General: Skin is warm and dry.     Capillary Refill: Capillary refill takes less than 2 seconds.     Findings: No rash.  Neurological:     General: No focal deficit present.     Mental Status: He is alert and oriented to person, place, and time.  Psychiatric:        Mood and Affect: Mood normal.        Behavior: Behavior normal.     LABORATORY DATA:  CBC    Component Value Date/Time   WBC 7.0 11/07/2018 1245   WBC 11.7 (H) 04/11/2018 1032   RBC 4.30 11/07/2018 1245   HGB 13.3 11/07/2018 1245   HGB 16.4 02/27/2018 1457   HCT 41.5 11/07/2018 1245   HCT 47.6 02/27/2018 1457   PLT 158 11/07/2018 1245   PLT 307 02/27/2018 1457   MCV 96.5 11/07/2018 1245   MCV 90 02/27/2018 1457   MCH 30.9 11/07/2018 1245   MCHC 32.0 11/07/2018 1245   RDW 14.6 11/07/2018 1245   RDW 14.5 02/27/2018 1457   LYMPHSABS 0.7 11/07/2018 1245   MONOABS 0.5 11/07/2018 1245   EOSABS 0.5 11/07/2018 1245   BASOSABS 0.1 11/07/2018 1245    CMP     Component Value Date/Time   NA 138 11/07/2018 1245   NA 139 02/27/2018 1457   K 4.3 11/07/2018 1245   CL 97 (L) 11/07/2018 1245   CO2 33 (H) 11/07/2018 1245   GLUCOSE 68 (L) 11/07/2018 1245   BUN 9 11/07/2018 1245   BUN 7 02/27/2018 1457   CREATININE 1.28 (H) 11/07/2018 1245   CREATININE 1.13 06/28/2016 1453   CALCIUM 8.9 11/07/2018 1245   PROT 7.0 11/07/2018 1245   PROT 6.5 01/31/2018 1124   ALBUMIN 4.1 11/07/2018 1245  ALBUMIN 4.4 01/31/2018 1124   AST 31 11/07/2018 1245   ALT 34 11/07/2018 1245   ALKPHOS 101 11/07/2018 1245   BILITOT 0.4 11/07/2018 1245   GFRNONAA >60 11/07/2018 1245   GFRNONAA 74 06/28/2016 1453   GFRAA >60 11/07/2018 1245   GFRAA 85 06/28/2016 1453        ASSESSMENT and THERAPY  PLAN:   Non-small cell carcinoma of right lung, stage 3 (HCC) [name] is a 58 year old male with stage IIIA non small cell lung cancer diagnosed in 02/2018.    BIOMARKER FINDINGS: Tumor Mutational Burden - TMB-High (21 Muts/Mb) Microsatellite status - MS-Stable Genomic Findings For a complete list of the genes assayed, please refer to the Appendix. ATM [phone]* CCND2 P281R KRAS G12V CHEK2 T357f*15 TP53 E298* 7 Disease relevant genes with no reportable alterations: ALK, EGFR, BRAF, MET, RET, ERBB2, ROS1  TREATMENT:  Concurrent radiation and Paclitaxel/Carboplatin 04/03/2018-05/21/2018 Consolidation with Imfinzi started 06/20/2018  Most recent CT chest on 09/18/2018 shows improvement in disease response with improvement in right upper lobe mass from 6 x 4.2 cm to 5.2 x 3.3 cm.    _____________________________________________________________________________________  Mark Reaperis doing well today. He continues on Imfinzi every 2 weeks and is tolerating it well.  He will continue this.  His blood pressure is borderline low, however he is asymptomatic and is on several cardiac meds due to his extensive cardiac history.    He will return in two weeks for labs and f/u with Dr. MJulien Nordmann   All questions were answered. The patient knows to call the clinic with any problems, questions or concerns. We can certainly see the patient much sooner if necessary.  A total of (20) minutes of face-to-face time was spent with this patient with greater than 50% of that time in counseling and care-coordination.  This note was electronically signed. LScot Dock NP 11/07/2018

## 2018-11-08 ENCOUNTER — Telehealth: Payer: Self-pay | Admitting: Adult Health

## 2018-11-08 NOTE — Telephone Encounter (Signed)
No los °

## 2018-11-21 ENCOUNTER — Encounter: Payer: Self-pay | Admitting: Internal Medicine

## 2018-11-21 ENCOUNTER — Other Ambulatory Visit: Payer: Medicare Other

## 2018-11-21 ENCOUNTER — Inpatient Hospital Stay (HOSPITAL_BASED_OUTPATIENT_CLINIC_OR_DEPARTMENT_OTHER): Payer: Medicare Other | Admitting: Internal Medicine

## 2018-11-21 ENCOUNTER — Telehealth: Payer: Self-pay | Admitting: Internal Medicine

## 2018-11-21 ENCOUNTER — Telehealth: Payer: Self-pay | Admitting: Medical Oncology

## 2018-11-21 ENCOUNTER — Inpatient Hospital Stay: Payer: Medicare Other

## 2018-11-21 ENCOUNTER — Other Ambulatory Visit: Payer: Self-pay | Admitting: Medical Oncology

## 2018-11-21 VITALS — BP 106/78 | HR 72 | Temp 98.0°F | Resp 18 | Ht 73.0 in | Wt 240.6 lb

## 2018-11-21 DIAGNOSIS — Z5112 Encounter for antineoplastic immunotherapy: Secondary | ICD-10-CM

## 2018-11-21 DIAGNOSIS — R7989 Other specified abnormal findings of blood chemistry: Secondary | ICD-10-CM

## 2018-11-21 DIAGNOSIS — R5383 Other fatigue: Secondary | ICD-10-CM

## 2018-11-21 DIAGNOSIS — C3491 Malignant neoplasm of unspecified part of right bronchus or lung: Secondary | ICD-10-CM

## 2018-11-21 DIAGNOSIS — C3411 Malignant neoplasm of upper lobe, right bronchus or lung: Secondary | ICD-10-CM | POA: Diagnosis not present

## 2018-11-21 DIAGNOSIS — Z79899 Other long term (current) drug therapy: Secondary | ICD-10-CM | POA: Diagnosis not present

## 2018-11-21 DIAGNOSIS — Z95828 Presence of other vascular implants and grafts: Secondary | ICD-10-CM

## 2018-11-21 DIAGNOSIS — Z720 Tobacco use: Secondary | ICD-10-CM | POA: Diagnosis not present

## 2018-11-21 LAB — CBC WITH DIFFERENTIAL (CANCER CENTER ONLY)
Abs Immature Granulocytes: 0.11 10*3/uL — ABNORMAL HIGH (ref 0.00–0.07)
BASOS PCT: 1 %
Basophils Absolute: 0.1 10*3/uL (ref 0.0–0.1)
Eosinophils Absolute: 0.4 10*3/uL (ref 0.0–0.5)
Eosinophils Relative: 6 %
HCT: 40.6 % (ref 39.0–52.0)
Hemoglobin: 12.9 g/dL — ABNORMAL LOW (ref 13.0–17.0)
Immature Granulocytes: 2 %
Lymphocytes Relative: 10 %
Lymphs Abs: 0.6 10*3/uL — ABNORMAL LOW (ref 0.7–4.0)
MCH: 30.7 pg (ref 26.0–34.0)
MCHC: 31.8 g/dL (ref 30.0–36.0)
MCV: 96.7 fL (ref 80.0–100.0)
Monocytes Absolute: 0.5 10*3/uL (ref 0.1–1.0)
Monocytes Relative: 7 %
Neutro Abs: 4.9 10*3/uL (ref 1.7–7.7)
Neutrophils Relative %: 74 %
PLATELETS: 159 10*3/uL (ref 150–400)
RBC: 4.2 MIL/uL — ABNORMAL LOW (ref 4.22–5.81)
RDW: 14.9 % (ref 11.5–15.5)
WBC Count: 6.6 10*3/uL (ref 4.0–10.5)
nRBC: 0 % (ref 0.0–0.2)

## 2018-11-21 LAB — CMP (CANCER CENTER ONLY)
ALT: 89 U/L — ABNORMAL HIGH (ref 0–44)
ANION GAP: 8 (ref 5–15)
AST: 79 U/L — ABNORMAL HIGH (ref 15–41)
Albumin: 4.1 g/dL (ref 3.5–5.0)
Alkaline Phosphatase: 102 U/L (ref 38–126)
BUN: 13 mg/dL (ref 6–20)
CHLORIDE: 100 mmol/L (ref 98–111)
CO2: 31 mmol/L (ref 22–32)
Calcium: 8.4 mg/dL — ABNORMAL LOW (ref 8.9–10.3)
Creatinine: 1.29 mg/dL — ABNORMAL HIGH (ref 0.61–1.24)
GFR, Est AFR Am: >60 mL/min (ref >60)
GFR, Estimated: >60 mL/min (ref >60)
Glucose, Bld: 81 mg/dL (ref 70–99)
POTASSIUM: 4.1 mmol/L (ref 3.5–5.1)
Sodium: 139 mmol/L (ref 135–145)
TOTAL PROTEIN: 7.2 g/dL (ref 6.5–8.1)
Total Bilirubin: 0.5 mg/dL (ref 0.3–1.2)

## 2018-11-21 LAB — TSH: TSH: 177.957 u[IU]/mL — ABNORMAL HIGH (ref 0.320–4.118)

## 2018-11-21 MED ORDER — SODIUM CHLORIDE 0.9 % IV SOLN
Freq: Once | INTRAVENOUS | Status: AC
  Administered 2018-11-21: 12:00:00 via INTRAVENOUS
  Filled 2018-11-21: qty 250

## 2018-11-21 MED ORDER — SODIUM CHLORIDE 0.9 % IV SOLN
1000.0000 mg | Freq: Once | INTRAVENOUS | Status: AC
  Administered 2018-11-21: 1000 mg via INTRAVENOUS
  Filled 2018-11-21: qty 20

## 2018-11-21 MED ORDER — SODIUM CHLORIDE 0.9% FLUSH
10.0000 mL | Freq: Once | INTRAVENOUS | Status: AC
  Administered 2018-11-21: 10 mL
  Filled 2018-11-21: qty 10

## 2018-11-21 MED ORDER — HEPARIN SOD (PORK) LOCK FLUSH 100 UNIT/ML IV SOLN
500.0000 [IU] | Freq: Once | INTRAVENOUS | Status: AC | PRN
  Administered 2018-11-21: 500 [IU]
  Filled 2018-11-21: qty 5

## 2018-11-21 MED ORDER — LEVOTHYROXINE SODIUM 125 MCG PO TABS: 125.0000 ug | ORAL_TABLET | Freq: Every day | ORAL | 0 refills | Status: DC

## 2018-11-21 MED ORDER — SODIUM CHLORIDE 0.9% FLUSH
10.0000 mL | INTRAVENOUS | Status: DC | PRN
  Administered 2018-11-21: 10 mL
  Filled 2018-11-21: qty 10

## 2018-11-21 NOTE — Progress Notes (Signed)
[facility] Telephone:([phone]) [phone]   Fax:(336) [phone]  OFFICE PROGRESS NOTE  [place], [place] [address]  DIAGNOSIS:stage IIIA (T3, N2, M0) non-small cell lung cancer, adenosquamous carcinoma diagnosed in June 2019 and presented with large right upper lobe lung mass with questionable chest wall invasion as well as right hilar and mediastinal lymphadenopathy.  Biomarker Findings Tumor Mutational Burden - TMB-High (21 Muts/Mb) Microsatellite status - MS-Stable Genomic Findings For a complete list of the genes assayed, please refer to the Appendix. ATM O3785* CCND2 P281R KRAS G12V CHEK2 T358f*15 TP53 E298* 7 Disease relevant genes with no reportable alterations: ALK, EGFR, BRAF, MET, RET, ERBB2, ROS1  PRIOR THERAPY:Concurrent chemoradiation with chemotherapy consisting of weekly carboplatin for an AUC of 2 and paclitaxel 45 mg/m2. First dose given on 04/03/2018.  Status post 6 cycles.  Last dose was giving 05/15/2018 with partial response.  CURRENT THERAPY:Consolidation treatment with immunotherapy with Imfinzi (Durvalumab) 10 mg/KG every 2 weeks.  First dose 06/20/2018.  Status post 11 cycles.  INTERVAL HISTORY: Mark GRASSIA58y.o. male returns to the clinic today for follow-up visit accompanied by his wife.  The patient is feeling fine today with no concerning complaints except for fatigue.  He continues to tolerate his treatment with Imfinzi fairly well.  He denied having any chest pain, shortness of breath, cough or hemoptysis.  He denied having any fever or chills.  He has no nausea, vomiting, diarrhea or constipation.  He is here today for evaluation before starting cycle #12.   MEDICAL HISTORY: Past Medical History:  Diagnosis Date  . Acute hepatitis C virus infection 06/19/2007   Qualifier: Diagnosis of  By: MLeilani MerlCMA, Tiffany    . Acute ST elevation myocardial infarction (STEMI) involving left anterior descending  (LAD) coronary artery ([facility] 05/26/2016  . Acute ST elevation myocardial infarction (STEMI) of lateral wall ([facility] 05/26/2016  . 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 Anxiety   . Asthma   . Atherosclerosis of native arteries of the extremities with intermittent claudication 12/16/2011  . Chronic hepatitis C without hepatic coma ([facility] 05/03/2014  . COPD with chronic bronchitis and emphysema ([facility] 03/13/2018   FEV1 57%  . Depression   . DVT (deep venous thrombosis) ([facility] 2009; 2019   RLE; LLE  . Encounter for antineoplastic chemotherapy 03/22/2018  . Hepatitis C    "tx'd in 2015"  . High cholesterol   . Ischemic cardiomyopathy 03/02/2018  . Non-small cell carcinoma of right lung, stage 3 ([facility] 03/22/2018  . PAD (peripheral artery disease) ([facility] 01/25/2013  . Peripheral vascular disease, unspecified 07/20/2012  . Port-A-Cath in place 04/24/2018  . ST elevation myocardial infarction involving left anterior descending (LAD) coronary artery ([facility]   . STEMI (ST elevation myocardial infarction) ([facility] 05/26/2016  . Tobacco abuse 01/28/2016  . Tubular adenoma of colon 07/11/2014  . Urinary dribbling     ALLERGIES:  is allergic to lyrica [pregabalin] and neurontin [gabapentin].  MEDICATIONS:  Current Outpatient Medications  Medication Sig Dispense Refill  . alprazolam (XANAX) 2 MG tablet Take 2 mg by mouth 4 (four) times daily as needed for anxiety.     .Marland Kitchenamitriptyline (ELAVIL) 100 MG tablet Take 100 mg by mouth at bedtime.     .Marland Kitchenaspirin 81 MG EC tablet Take 81 mg by mouth daily.      .Marland Kitchenatorvastatin (LIPITOR) 80 MG tablet Take 1 tablet (80 mg total) by mouth daily at 6 PM. 30  tablet 12  . carvedilol (COREG) 6.25 MG tablet TAKE 1 TABLET BY MOUTH TWICE A DAY WITH A MEAL 60 tablet 8  . citalopram (CELEXA) 40 MG tablet Take 40 mg by mouth daily.  11  . furosemide (LASIX) 20 MG tablet Take 1 tablet (20 mg total) by mouth daily. 90 tablet 3  . HYDROcodone-acetaminophen (NORCO) 10-325  MG tablet Take 1 tablet by mouth every 4 (four) hours as needed for moderate pain.   0  . levETIRAcetam (KEPPRA) 750 MG tablet Take 1 tablet (750 mg total) by mouth 2 (two) times daily. 60 tablet 5  . levothyroxine (SYNTHROID, LEVOTHROID) 88 MCG tablet Take 1 tablet (88 mcg total) by mouth daily before breakfast. 30 tablet 2  . lidocaine-prilocaine (EMLA) cream Apply 1 application topically as needed. 30 g 0  . lisinopril (PRINIVIL,ZESTRIL) 2.5 MG tablet Take 1 tablet (2.5 mg total) by mouth daily. 30 tablet 12  . nitroGLYCERIN (NITROSTAT) 0.4 MG SL tablet Place 1 tablet (0.4 mg total) under the tongue every 5 (five) minutes x 3 doses as needed for chest pain. 25 tablet 2  . prochlorperazine (COMPAZINE) 10 MG tablet Take 1 tablet (10 mg total) by mouth every 6 (six) hours as needed for nausea or vomiting. 30 tablet 0  . spironolactone (ALDACTONE) 25 MG tablet TAKE 1 TABLET BY MOUTH EVERY DAY 90 tablet 3  . sucralfate (CARAFATE) 1 g tablet Take 1 tablet (1 g total) by mouth 4 (four) times daily -  with meals and at bedtime. 5 min before meals for radiation induced esophagitis 120 tablet 2  . tamsulosin (FLOMAX) 0.4 MG CAPS capsule Take 1 capsule by mouth daily.    . Tiotropium Bromide-Olodaterol (STIOLTO RESPIMAT) 2.5-2.5 MCG/ACT AERS Inhale 2 puffs into the lungs daily. 1 Inhaler 5  . umeclidinium-vilanterol (ANORO ELLIPTA) 62.5-25 MCG/INH AEPB Inhale 1 puff into the lungs daily. 1 each 0   No current facility-administered medications for this visit.     SURGICAL HISTORY:  Past Surgical History:  Procedure Laterality Date  . AORTOGRAM  08/08/2009   for LLE claudication     By Dr. Oneida Alar  . 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 BIOPSY N/A 05/30/2014   Procedure: BIOPSY;  Surgeon: [name], MD;  Location: [facility];  Service: Endoscopy;  Laterality: N/A;  . BLADDER TUMOR EXCISION  8/812  . CARDIAC CATHETERIZATION N/A 05/26/2016   Procedure: Left Heart Cath and Coronary Angiography;   Surgeon: [name], MD;  Location: [place] [facility];  Service: Cardiovascular;  Laterality: N/A;  . CARDIAC CATHETERIZATION N/A 05/26/2016   Procedure: Coronary Stent Intervention;  Surgeon: [name], MD;  Location: [facility];  Service: Cardiovascular;  Laterality: N/A;  . COLONOSCOPY WITH PROPOFOL N/A 05/30/2014   UUV:[id number] colonic polyp-likely source of hematochezia-removed as described above  . ESOPHAGOGASTRODUODENOSCOPY (EGD) WITH PROPOFOL N/A 05/30/2014   QIH:KVQQVZ and bulbar erosions s/p gastric biopsy. No evidence of portal gastropathy on today's examination.  . FEMORAL-TIBIAL BYPASS GRAFT  2009   Right side using non-reversed GSV   By Dr. Oneida Alar  . FEMORAL-TIBIAL BYPASS GRAFT  11/24/2007   Right femoral to anterior tibial BPG   by Dr. Oneida Alar  . FINGER SURGERY Left    "straightened my pinky"  . HAND TENDON SURGERY Left 2013   Left 5th finger  . HERNIA REPAIR     "stomach"  . ICD IMPLANT N/A 03/02/2018   Procedure: ICD IMPLANT;  Surgeon: [name],  MD;  Location: [place] [facility];  Service: Cardiovascular;  Laterality: N/A;  . INCISIONAL HERNIA REPAIR N/A 12/25/2014   Procedure: HERNIA REPAIR INCISIONAL WITH MESH;  Surgeon: [name], MD;  Location: [facility];  Service: General;  Laterality: N/A;  . INSERTION OF MESH N/A 12/25/2014   Procedure: INSERTION OF MESH;  Surgeon: [name], MD;  Location: [facility];  Service: General;  Laterality: N/A;  . IR IMAGING GUIDED PORT INSERTION  04/11/2018  . LAPAROSCOPIC CHOLECYSTECTOMY    . LOWER EXTREMITY ANGIOGRAM Left 12/30/2015   Procedure: Lower Extremity Angiogram;  Surgeon: [name], MD;  Location: [place] [facility];  Service: Cardiovascular;  Laterality: Left;  . PERIPHERAL VASCULAR CATHETERIZATION N/A 12/30/2015   Procedure: Abdominal Aortogram;  Surgeon: [name], MD;  Location: [place] [facility] [facility];  Service: Cardiovascular;  Laterality: N/A;  . POLYPECTOMY  05/30/2014   Procedure:  POLYPECTOMY;  Surgeon: [name], MD;  Location: [facility];  Service: Endoscopy;;  . TONSILLECTOMY AND ADENOIDECTOMY  ~ 1970  . TYMPANOSTOMY TUBE PLACEMENT Bilateral ~ 1970    REVIEW OF SYSTEMS:  A comprehensive review of systems was negative except for: Constitutional: positive for fatigue   PHYSICAL EXAMINATION: General appearance: alert, cooperative and no distress Head: Normocephalic, without obvious abnormality, atraumatic Neck: no adenopathy, no JVD, supple, symmetrical, trachea midline and thyroid not enlarged, symmetric, no tenderness/mass/nodules Lymph nodes: Cervical, supraclavicular, and axillary nodes normal. Resp: clear to auscultation bilaterally Back: symmetric, no curvature. ROM normal. No CVA tenderness. Cardio: regular rate and rhythm, S1, S2 normal, no murmur, click, rub or gallop GI: soft, non-tender; bowel sounds normal; no masses,  no organomegaly Extremities: Right below knee amputation otherwise no edema.  ECOG PERFORMANCE STATUS: 1 - Symptomatic but completely ambulatory  Blood pressure 106/78, pulse 72, temperature 98 F (36.7 C), temperature source Oral, resp. rate 18, height _0  (1.854 m), weight 240 lb 9.6 oz (109.1 kg), SpO2 96 %.  LABORATORY DATA: Lab Results  Component Value Date   WBC 6.6 11/21/2018   HGB 12.9 (L) 11/21/2018   HCT 40.6 11/21/2018   MCV 96.7 11/21/2018   PLT 159 11/21/2018      Chemistry      Component Value Date/Time   NA 138 11/07/2018 1245   NA 139 02/27/2018 1457   K 4.3 11/07/2018 1245   CL 97 (L) 11/07/2018 1245   CO2 33 (H) 11/07/2018 1245   BUN 9 11/07/2018 1245   BUN 7 02/27/2018 1457   CREATININE 1.28 (H) 11/07/2018 1245   CREATININE 1.13 06/28/2016 1453      Component Value Date/Time   CALCIUM 8.9 11/07/2018 1245   ALKPHOS 101 11/07/2018 1245   AST 31 11/07/2018 1245   ALT 34 11/07/2018 1245   BILITOT 0.4 11/07/2018 1245       RADIOGRAPHIC STUDIES: No results found.  ASSESSMENT AND PLAN: This is  a very pleasant 58 years old white male recently diagnosed with a stage IIIa non-small cell lung cancer, adenosquamous carcinoma.  He underwent a course of concurrent chemoradiation with weekly carboplatin and paclitaxel status post 6 cycles with partial response. The patient is currently undergoing treatment with consolidation immunotherapy with Imfinzi status post 11 cycles. The patient continues to tolerate his treatment well with no concerning adverse effects. I recommended for him to proceed with cycle #12 today as scheduled. I will see him back for follow-up visit in 2 weeks for evaluation before starting cycle #16. He was advised  to call immediately if he has any concerning symptoms in the interval. The patient voices understanding of current disease status and treatment options and is in agreement with the current care plan. All questions were answered. The patient knows to call the clinic with any problems, questions or concerns. We can certainly see the patient much sooner if necessary. I spent 10 minutes counseling the patient face to face. The total time spent in the appointment was 15 minutes.  Disclaimer: This note was dictated with voice recognition software. Similar sounding words can inadvertently be transcribed and may not be corrected upon review.

## 2018-11-21 NOTE — Telephone Encounter (Signed)
TSH result called to [facility].

## 2018-11-21 NOTE — Progress Notes (Signed)
Per Julien Nordmann it is okay to treat pt today with Imfinzi and todays AST.

## 2018-11-21 NOTE — Patient Instructions (Signed)
[facility] Discharge Instructions for Patients Receiving Chemotherapy  Today you received the following chemotherapy agents: Durvalumab (Imfinzi)   To help prevent nausea and vomiting after your treatment, we encourage you to take your nausea medication as directed.   If you develop nausea and vomiting that is not controlled by your nausea medication, call the clinic.   BELOW ARE SYMPTOMS THAT SHOULD BE REPORTED IMMEDIATELY:  *FEVER GREATER THAN 100.5 F  *CHILLS WITH OR WITHOUT FEVER  NAUSEA AND VOMITING THAT IS NOT CONTROLLED WITH YOUR NAUSEA MEDICATION  *UNUSUAL SHORTNESS OF BREATH  *UNUSUAL BRUISING OR BLEEDING  TENDERNESS IN MOUTH AND THROAT WITH OR WITHOUT PRESENCE OF ULCERS  *URINARY PROBLEMS  *BOWEL PROBLEMS  UNUSUAL RASH Items with * indicate a potential emergency and should be followed up as soon as possible.  Feel free to call the clinic should you have any questions or concerns. The clinic phone number is [phone].  Please show the CHEMO ALERT CARD at check-in to the Emergency Department and triage nurse.   

## 2018-11-21 NOTE — Telephone Encounter (Signed)
Gave avs and calendar ° °

## 2018-11-28 DIAGNOSIS — J449 Chronic obstructive pulmonary disease, unspecified: Secondary | ICD-10-CM | POA: Diagnosis not present

## 2018-11-28 DIAGNOSIS — C3491 Malignant neoplasm of unspecified part of right bronchus or lung: Secondary | ICD-10-CM | POA: Diagnosis not present

## 2018-11-28 DIAGNOSIS — G894 Chronic pain syndrome: Secondary | ICD-10-CM | POA: Diagnosis not present

## 2018-11-28 DIAGNOSIS — Z1389 Encounter for screening for other disorder: Secondary | ICD-10-CM | POA: Diagnosis not present

## 2018-11-28 DIAGNOSIS — Z0001 Encounter for general adult medical examination with abnormal findings: Secondary | ICD-10-CM | POA: Diagnosis not present

## 2018-12-05 ENCOUNTER — Ambulatory Visit (INDEPENDENT_AMBULATORY_CARE_PROVIDER_SITE_OTHER): Payer: Medicare Other | Admitting: *Deleted

## 2018-12-05 DIAGNOSIS — I255 Ischemic cardiomyopathy: Secondary | ICD-10-CM | POA: Diagnosis not present

## 2018-12-06 ENCOUNTER — Inpatient Hospital Stay (HOSPITAL_BASED_OUTPATIENT_CLINIC_OR_DEPARTMENT_OTHER): Payer: Medicare Other | Admitting: Medical

## 2018-12-06 ENCOUNTER — Inpatient Hospital Stay: Payer: Medicare Other

## 2018-12-06 ENCOUNTER — Other Ambulatory Visit: Payer: Self-pay

## 2018-12-06 ENCOUNTER — Other Ambulatory Visit: Payer: Self-pay | Admitting: Internal Medicine

## 2018-12-06 ENCOUNTER — Inpatient Hospital Stay: Payer: Medicare Other | Attending: Internal Medicine

## 2018-12-06 VITALS — BP 94/65 | HR 63 | Temp 98.4°F | Resp 18

## 2018-12-06 DIAGNOSIS — C3491 Malignant neoplasm of unspecified part of right bronchus or lung: Secondary | ICD-10-CM

## 2018-12-06 DIAGNOSIS — Z5112 Encounter for antineoplastic immunotherapy: Secondary | ICD-10-CM | POA: Insufficient documentation

## 2018-12-06 DIAGNOSIS — C3411 Malignant neoplasm of upper lobe, right bronchus or lung: Secondary | ICD-10-CM | POA: Insufficient documentation

## 2018-12-06 DIAGNOSIS — Z95828 Presence of other vascular implants and grafts: Secondary | ICD-10-CM

## 2018-12-06 DIAGNOSIS — Z720 Tobacco use: Secondary | ICD-10-CM | POA: Diagnosis not present

## 2018-12-06 LAB — CBC WITH DIFFERENTIAL (CANCER CENTER ONLY)
Abs Immature Granulocytes: 0.05 10*3/uL (ref 0.00–0.07)
BASOS ABS: 0.1 10*3/uL (ref 0.0–0.1)
Basophils Relative: 1 %
Eosinophils Absolute: 0.4 10*3/uL (ref 0.0–0.5)
Eosinophils Relative: 6 %
HCT: 40 % (ref 39.0–52.0)
Hemoglobin: 12.8 g/dL — ABNORMAL LOW (ref 13.0–17.0)
Immature Granulocytes: 1 %
Lymphocytes Relative: 7 %
Lymphs Abs: 0.4 10*3/uL — ABNORMAL LOW (ref 0.7–4.0)
MCH: 31 pg (ref 26.0–34.0)
MCHC: 32 g/dL (ref 30.0–36.0)
MCV: 96.9 fL (ref 80.0–100.0)
Monocytes Absolute: 0.6 10*3/uL (ref 0.1–1.0)
Monocytes Relative: 9 %
NEUTROS PCT: 76 %
Neutro Abs: 5 10*3/uL (ref 1.7–7.7)
PLATELETS: 151 10*3/uL (ref 150–400)
RBC: 4.13 MIL/uL — ABNORMAL LOW (ref 4.22–5.81)
RDW: 15 % (ref 11.5–15.5)
WBC Count: 6.5 10*3/uL (ref 4.0–10.5)
nRBC: 0 % (ref 0.0–0.2)

## 2018-12-06 LAB — CUP PACEART REMOTE DEVICE CHECK
Battery Remaining Longevity: 133 mo
Battery Voltage: 3.04 V
Brady Statistic RV Percent Paced: 0 %
HighPow Impedance: 79 Ohm
Implantable Lead Implant Date: 20190606
Implantable Lead Location: 753860
Implantable Pulse Generator Implant Date: 20190606
Lead Channel Impedance Value: 475 Ohm
Lead Channel Pacing Threshold Amplitude: 1 V
Lead Channel Pacing Threshold Pulse Width: 0.4 ms
Lead Channel Sensing Intrinsic Amplitude: 24.375 mV
Lead Channel Sensing Intrinsic Amplitude: 24.375 mV
Lead Channel Setting Pacing Amplitude: 2.5 V
Lead Channel Setting Pacing Pulse Width: 0.4 ms
Lead Channel Setting Sensing Sensitivity: 0.3 mV
MDC IDC MSMT LEADCHNL RV IMPEDANCE VALUE: 418 Ohm
MDC IDC SESS DTM: 20200310062602

## 2018-12-06 LAB — CMP (CANCER CENTER ONLY)
ALT: 39 U/L (ref 0–44)
AST: 31 U/L (ref 15–41)
Albumin: 4.2 g/dL (ref 3.5–5.0)
Alkaline Phosphatase: 116 U/L (ref 38–126)
Anion gap: 10 (ref 5–15)
BUN: 12 mg/dL (ref 6–20)
CO2: 30 mmol/L (ref 22–32)
Calcium: 8.7 mg/dL — ABNORMAL LOW (ref 8.9–10.3)
Chloride: 97 mmol/L — ABNORMAL LOW (ref 98–111)
Creatinine: 1.2 mg/dL (ref 0.61–1.24)
GFR, Est AFR Am: >60 mL/min (ref >60)
GFR, Estimated: >60 mL/min (ref >60)
Glucose, Bld: 83 mg/dL (ref 70–99)
Potassium: 4.2 mmol/L (ref 3.5–5.1)
SODIUM: 137 mmol/L (ref 135–145)
Total Bilirubin: 0.5 mg/dL (ref 0.3–1.2)
Total Protein: 7.5 g/dL (ref 6.5–8.1)

## 2018-12-06 MED ORDER — SODIUM CHLORIDE 0.9 % IV SOLN
9.6000 mg/kg | Freq: Once | INTRAVENOUS | Status: AC
  Administered 2018-12-06: 1000 mg via INTRAVENOUS
  Filled 2018-12-06: qty 20

## 2018-12-06 MED ORDER — SODIUM CHLORIDE 0.9% FLUSH
10.0000 mL | Freq: Once | INTRAVENOUS | Status: AC
  Administered 2018-12-06: 10 mL
  Filled 2018-12-06: qty 10

## 2018-12-06 MED ORDER — SODIUM CHLORIDE 0.9% FLUSH
10.0000 mL | INTRAVENOUS | Status: DC | PRN
  Administered 2018-12-06: 10 mL
  Filled 2018-12-06: qty 10

## 2018-12-06 MED ORDER — SODIUM CHLORIDE 0.9 % IV SOLN
Freq: Once | INTRAVENOUS | Status: AC
  Administered 2018-12-06: 13:00:00 via INTRAVENOUS
  Filled 2018-12-06: qty 250

## 2018-12-06 MED ORDER — HEPARIN SOD (PORK) LOCK FLUSH 100 UNIT/ML IV SOLN
500.0000 [IU] | Freq: Once | INTRAVENOUS | Status: AC | PRN
  Administered 2018-12-06: 500 [IU]
  Filled 2018-12-06: qty 5

## 2018-12-06 NOTE — Patient Instructions (Signed)
[facility] Discharge Instructions for Patients Receiving Chemotherapy  Today you received the following chemotherapy agents: Durvalumab (Imfinzi)   To help prevent nausea and vomiting after your treatment, we encourage you to take your nausea medication as directed.   If you develop nausea and vomiting that is not controlled by your nausea medication, call the clinic.   BELOW ARE SYMPTOMS THAT SHOULD BE REPORTED IMMEDIATELY:  *FEVER GREATER THAN 100.5 F  *CHILLS WITH OR WITHOUT FEVER  NAUSEA AND VOMITING THAT IS NOT CONTROLLED WITH YOUR NAUSEA MEDICATION  *UNUSUAL SHORTNESS OF BREATH  *UNUSUAL BRUISING OR BLEEDING  TENDERNESS IN MOUTH AND THROAT WITH OR WITHOUT PRESENCE OF ULCERS  *URINARY PROBLEMS  *BOWEL PROBLEMS  UNUSUAL RASH Items with * indicate a potential emergency and should be followed up as soon as possible.  Feel free to call the clinic should you have any questions or concerns. The clinic phone number is [phone].  Please show the CHEMO ALERT CARD at check-in to the Emergency Department and triage nurse.   

## 2018-12-06 NOTE — Progress Notes (Signed)
Cbc and CMET reviewed by MD VO ok to treat despite labs, TSH elevated MD aware pt taking 144mcg 1 tablet daily Synthroid. Ok to proceed with Treatment per MD

## 2018-12-07 NOTE — Progress Notes (Signed)
[facility] Progress Note   [name] [id number] 1961-09-26 58 y.o.  [name] is managed by Dr. Fanny Bien. [name]  Actively treated with chemotherapy/immunotherapy/hormonal therapy: yes  Current therapy: Imfinzi  Last treated: 11/21/2018 (cycle 12, day 1)  Next scheduled appointment with provider:   12/20/2018  Assessment: Plan:    Non-small cell carcinoma of right lung, stage 3 (HCC)   Stage IIIa (T3, N2, M0) non-small cell lung cancer, adenosquamous carcinoma: The patient presents to the clinic today for cycle 13, day 1 of Imfinzi.  We will proceed with his treatment today.  He will return for follow-up and for consideration of his next treatment on 12/20/2018.  Please see After Visit Summary for patient specific instructions.  Future Appointments  Date Time Provider [place]  12/20/2018  9:00 AM [facility] LAB 1 [facility] None  12/20/2018  9:15 AM [facility] [place] FLUSH [facility] None  12/20/2018 10:00 AM Katrinka Herbison, Lucianne Lei E., PA-C [facility] None  12/20/2018 10:30 AM [facility] INFUSION [facility] None  01/02/2019  9:15 AM [facility] LAB ONLY [facility] None  01/02/2019  9:30 AM [facility] [place] FLUSH [facility] None  01/02/2019 10:00 AM Heilingoetter, Cassandra L, PA-C [facility] None  01/02/2019 11:15 AM [facility] INFUSION [facility] None  04/03/2019 11:00 AM Vaslow, Acey Lav, MD [facility] None    No orders of the defined types were placed in this encounter.      Subjective:   Patient ID:  [name] is a 58 y.o. (DOB 05-26-1961) male.  Chief Complaint: No chief complaint on file.   HPI [name]   is a 58 year old male with a diagnosis of a stage IIIa (T3, N2, M0) non-small cell lung cancer, adenosquamous carcinoma.  He continues to be followed by Dr. Julien Nordmann and presents to the clinic today for cycle 13, day 1 of Imfinzi.  He was seen in the infusion room today.  He reports that he is doing well overall without any acute issues of concern.   His appetite is good.  He denies fevers, chills, sweats, headaches, pain, nausea, vomiting, constipation, or diarrhea.  Medications: I have reviewed the patient's current medications.  Allergies:  Allergies  Allergen Reactions   Lyrica [Pregabalin] Palpitations    Chest pain and heart palps.   Neurontin [Gabapentin] Palpitations    Chest pain and heart palp    Past Medical History:  Diagnosis Date   Acute hepatitis C virus infection 06/19/2007   Qualifier: Diagnosis of  By: Leilani Merl CMA, Tiffany     Acute ST elevation myocardial infarction (STEMI) involving left anterior descending (LAD) coronary artery (HCC) 05/26/2016   Acute ST elevation myocardial infarction (STEMI) of lateral wall (HCC) 05/26/2016   AICD (automatic cardioverter/defibrillator) present 03/02/2018   Anxiety    Asthma    Atherosclerosis of native arteries of the extremities with intermittent claudication 12/16/2011   Chronic hepatitis C without hepatic coma ([place]) 05/03/2014   COPD with chronic bronchitis and emphysema ([place]) 03/13/2018   FEV1 57%   Depression    DVT (deep venous thrombosis) ([place]) 2009; 2019   RLE; LLE   Encounter for antineoplastic chemotherapy 03/22/2018   Hepatitis C    "tx'd in 2015"   High cholesterol    Ischemic cardiomyopathy 03/02/2018   Non-small cell carcinoma of right lung, stage 3 ([place]) 03/22/2018   PAD (peripheral artery disease) ([place]) 01/25/2013   Peripheral vascular disease, unspecified 07/20/2012   Port-A-Cath in place 04/24/2018   ST elevation myocardial infarction involving left anterior descending (  LAD) coronary artery [facility])    STEMI (ST elevation myocardial infarction) ([place]) 05/26/2016   Tobacco abuse 01/28/2016   Tubular adenoma of colon 07/11/2014   Urinary dribbling     Past Surgical History:  Procedure Laterality Date   AORTOGRAM  08/08/2009   for LLE claudication     By Dr. 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   BIOPSY N/A 05/30/2014    Procedure: BIOPSY;  Surgeon: [name], MD;  Location: [facility];  Service: Endoscopy;  Laterality: N/A;   BLADDER TUMOR EXCISION  8/812   CARDIAC CATHETERIZATION N/A 05/26/2016   Procedure: Left Heart Cath and Coronary Angiography;  Surgeon: [name], MD;  Location: [place] [facility];  Service: Cardiovascular;  Laterality: N/A;   CARDIAC CATHETERIZATION N/A 05/26/2016   Procedure: Coronary Stent Intervention;  Surgeon: [name], MD;  Location: [place] [facility];  Service: Cardiovascular;  Laterality: N/A;   COLONOSCOPY WITH PROPOFOL N/A 05/30/2014   IRJ:JOACZY colonic polyp-likely source of hematochezia-removed as described above   ESOPHAGOGASTRODUODENOSCOPY (EGD) WITH PROPOFOL N/A 05/30/2014   SAY:TKZSWF and bulbar erosions s/p gastric biopsy. No evidence of portal gastropathy on today's examination.   FEMORAL-TIBIAL BYPASS GRAFT  2009   Right side using non-reversed GSV   By Dr. Anson [place] BYPASS GRAFT  11/24/2007   Right femoral to anterior tibial BPG   by Dr. Oneida Alar   FINGER SURGERY Left    "straightened my pinky"   HAND TENDON SURGERY Left 2013   Left 5th finger   HERNIA REPAIR     "stomach"   ICD IMPLANT N/A 03/02/2018   Procedure: ICD IMPLANT;  Surgeon: [name], MD;  Location: [place] [facility];  Service: Cardiovascular;  Laterality: N/A;   INCISIONAL HERNIA REPAIR N/A 12/25/2014   Procedure: HERNIA REPAIR INCISIONAL WITH MESH;  Surgeon: [name], MD;  Location: [facility];  Service: General;  Laterality: N/A;   INSERTION OF MESH N/A 12/25/2014   Procedure: INSERTION OF MESH;  Surgeon: [name], MD;  Location: [facility];  Service: General;  Laterality: N/A;   IR IMAGING GUIDED PORT INSERTION  04/11/2018   LAPAROSCOPIC CHOLECYSTECTOMY     LOWER EXTREMITY ANGIOGRAM Left 12/30/2015   Procedure: Lower Extremity Angiogram;  Surgeon: [name], MD;  Location: [place] [facility];  Service: Cardiovascular;  Laterality: Left;    PERIPHERAL VASCULAR CATHETERIZATION N/A 12/30/2015   Procedure: Abdominal Aortogram;  Surgeon: [name], MD;  Location: [place] [facility];  Service: Cardiovascular;  Laterality: N/A;   POLYPECTOMY  05/30/2014   Procedure: POLYPECTOMY;  Surgeon: [name], MD;  Location: [facility];  Service: Endoscopy;;   TONSILLECTOMY AND ADENOIDECTOMY  ~ 1970   TYMPANOSTOMY TUBE PLACEMENT Bilateral ~ 1970    Family History  Problem Relation Age of Onset   Vision loss Mother    Ulcers Father    Colon cancer Neg Hx     Social History   Socioeconomic History   Marital status: Married    Spouse name: Not on file   Number of children: Not on file   Years of education: Not on file   Highest education level: Not on file  Occupational History   Not on file  Social Needs   Financial resource strain: Not on file   Food insecurity:    Worry: Not on file    Inability: Not on file   Transportation needs:    Medical:  Not on file    Non-medical: Not on file  Tobacco Use   Smoking status: Current Every Day Smoker    Packs/day: 0.50    Years: 31.00    Pack years: 15.50    Types: Cigarettes   Smokeless tobacco: Never Used   Tobacco comment: half a pack/day now  Substance and Sexual Activity   Alcohol use: Yes    Alcohol/week: 18.0 standard drinks    Types: 18 Cans of beer per week    Comment:  history of heavy ETOH use; 03/02/2018 "6-12 beers Fri & Sat"   Drug use: Yes    Types: Marijuana    Comment: 03/02/2018 "weekly"   Sexual activity: Not Currently  Lifestyle   Physical activity:    Days per week: Not on file    Minutes per session: Not on file   Stress: Not on file  Relationships   Social connections:    Talks on phone: Not on file    Gets together: Not on file    Attends religious service: Not on file    Active member of club or organization: Not on file    Attends meetings of clubs or organizations: Not on file    Relationship status: Not on file    Intimate partner violence:    Fear of current or ex partner: Not on file    Emotionally abused: Not on file    Physically abused: Not on file    Forced sexual activity: Not on file  Other Topics Concern   Not on file  Social History Narrative   Not on file    Past Medical History, Surgical history, Social history, and Family history were reviewed and updated as appropriate.   Please see review of systems for further details on the patient's review from today.   Review of Systems:  Review of Systems  Constitutional: Negative for chills, diaphoresis and fever.  HENT: Negative for trouble swallowing and voice change.   Respiratory: Negative for cough, chest tightness, shortness of breath and wheezing.   Cardiovascular: Negative for chest pain and palpitations.  Gastrointestinal: Negative for abdominal pain, constipation, diarrhea, nausea and vomiting.  Musculoskeletal: Negative for back pain and myalgias.  Neurological: Negative for dizziness, light-headedness and headaches.    Objective:   Physical Exam:  There were no vitals taken for this visit. ECOG: 0  Physical Exam Constitutional:      General: He is not in acute distress.    Appearance: He is not ill-appearing or diaphoretic.  HENT:     Head: Normocephalic and atraumatic.  Cardiovascular:     Rate and Rhythm: Normal rate and regular rhythm.     Heart sounds: Normal heart sounds. No murmur. No friction rub. No gallop.   Pulmonary:     Effort: Pulmonary effort is normal. No respiratory distress.     Breath sounds: Normal breath sounds. No wheezing or rales.  Abdominal:     General: Bowel sounds are normal. There is no distension.     Tenderness: There is no abdominal tenderness. There is no guarding.  Skin:    General: Skin is warm and dry.     Findings: No erythema or rash.     Lab Review:     Component Value Date/Time   NA 137 12/06/2018 1100   NA 139 02/27/2018 1457   K 4.2 12/06/2018 1100   CL 97 (L)  12/06/2018 1100   CO2 30 12/06/2018 1100   GLUCOSE 83 12/06/2018 1100  BUN 12 12/06/2018 1100   BUN 7 02/27/2018 1457   CREATININE 1.20 12/06/2018 1100   CREATININE 1.13 06/28/2016 1453   CALCIUM 8.7 (L) 12/06/2018 1100   PROT 7.5 12/06/2018 1100   PROT 6.5 01/31/2018 1124   ALBUMIN 4.2 12/06/2018 1100   ALBUMIN 4.4 01/31/2018 1124   AST 31 12/06/2018 1100   ALT 39 12/06/2018 1100   ALKPHOS 116 12/06/2018 1100   BILITOT 0.5 12/06/2018 1100   GFRNONAA >60 12/06/2018 1100   GFRNONAA 74 06/28/2016 1453   GFRAA >60 12/06/2018 1100   GFRAA 85 06/28/2016 1453       Component Value Date/Time   WBC 6.5 12/06/2018 1100   WBC 11.7 (H) 04/11/2018 1032   RBC 4.13 (L) 12/06/2018 1100   HGB 12.8 (L) 12/06/2018 1100   HGB 16.4 02/27/2018 1457   HCT 40.0 12/06/2018 1100   HCT 47.6 02/27/2018 1457   PLT 151 12/06/2018 1100   PLT 307 02/27/2018 1457   MCV 96.9 12/06/2018 1100   MCV 90 02/27/2018 1457   MCH 31.0 12/06/2018 1100   MCHC 32.0 12/06/2018 1100   RDW 15.0 12/06/2018 1100   RDW 14.5 02/27/2018 1457   LYMPHSABS 0.4 (L) 12/06/2018 1100   MONOABS 0.6 12/06/2018 1100   EOSABS 0.4 12/06/2018 1100   BASOSABS 0.1 12/06/2018 1100   -------------------------------  Imaging from last 24 hours (if applicable):  Radiology interpretation: No results found.

## 2018-12-13 ENCOUNTER — Encounter: Payer: Self-pay | Admitting: Cardiology

## 2018-12-13 NOTE — Progress Notes (Signed)
Remote ICD transmission.   

## 2018-12-15 ENCOUNTER — Other Ambulatory Visit: Payer: Self-pay | Admitting: Internal Medicine

## 2018-12-15 DIAGNOSIS — R7989 Other specified abnormal findings of blood chemistry: Secondary | ICD-10-CM

## 2018-12-20 ENCOUNTER — Ambulatory Visit: Payer: Medicare Other

## 2018-12-20 ENCOUNTER — Other Ambulatory Visit: Payer: Medicare Other

## 2018-12-20 ENCOUNTER — Ambulatory Visit: Payer: Medicare Other | Admitting: Medical

## 2018-12-28 DIAGNOSIS — G894 Chronic pain syndrome: Secondary | ICD-10-CM | POA: Diagnosis not present

## 2018-12-28 DIAGNOSIS — I700 Atherosclerosis of aorta: Secondary | ICD-10-CM | POA: Diagnosis not present

## 2019-01-02 ENCOUNTER — Other Ambulatory Visit: Payer: Self-pay

## 2019-01-02 ENCOUNTER — Inpatient Hospital Stay: Payer: Medicare Other | Attending: Internal Medicine | Admitting: Physician Assistant

## 2019-01-02 ENCOUNTER — Encounter: Payer: Self-pay | Admitting: Physician Assistant

## 2019-01-02 ENCOUNTER — Inpatient Hospital Stay: Payer: Medicare Other

## 2019-01-02 ENCOUNTER — Telehealth: Payer: Self-pay | Admitting: *Deleted

## 2019-01-02 VITALS — BP 102/66 | HR 69 | Temp 97.6°F | Resp 18 | Ht 73.0 in | Wt 230.9 lb

## 2019-01-02 DIAGNOSIS — K5900 Constipation, unspecified: Secondary | ICD-10-CM | POA: Diagnosis not present

## 2019-01-02 DIAGNOSIS — Z5112 Encounter for antineoplastic immunotherapy: Secondary | ICD-10-CM | POA: Diagnosis not present

## 2019-01-02 DIAGNOSIS — C3491 Malignant neoplasm of unspecified part of right bronchus or lung: Secondary | ICD-10-CM

## 2019-01-02 DIAGNOSIS — C3411 Malignant neoplasm of upper lobe, right bronchus or lung: Secondary | ICD-10-CM | POA: Diagnosis not present

## 2019-01-02 DIAGNOSIS — Z79899 Other long term (current) drug therapy: Secondary | ICD-10-CM | POA: Diagnosis not present

## 2019-01-02 DIAGNOSIS — Z720 Tobacco use: Secondary | ICD-10-CM

## 2019-01-02 DIAGNOSIS — Z95828 Presence of other vascular implants and grafts: Secondary | ICD-10-CM

## 2019-01-02 LAB — CMP (CANCER CENTER ONLY)
ALT: 29 U/L (ref 0–44)
AST: 23 U/L (ref 15–41)
Albumin: 4 g/dL (ref 3.5–5.0)
Alkaline Phosphatase: 136 U/L — ABNORMAL HIGH (ref 38–126)
Anion gap: 7 (ref 5–15)
BUN: 10 mg/dL (ref 6–20)
CO2: 32 mmol/L (ref 22–32)
Calcium: 9 mg/dL (ref 8.9–10.3)
Chloride: 96 mmol/L — ABNORMAL LOW (ref 98–111)
Creatinine: 1.25 mg/dL — ABNORMAL HIGH (ref 0.61–1.24)
GFR, Est AFR Am: >60 mL/min (ref >60)
GFR, Estimated: >60 mL/min (ref >60)
Glucose, Bld: 90 mg/dL (ref 70–99)
Potassium: 4.2 mmol/L (ref 3.5–5.1)
Sodium: 135 mmol/L (ref 135–145)
Total Bilirubin: 0.5 mg/dL (ref 0.3–1.2)
Total Protein: 7.6 g/dL (ref 6.5–8.1)

## 2019-01-02 LAB — CBC WITH DIFFERENTIAL (CANCER CENTER ONLY)
Abs Immature Granulocytes: 0.13 10*3/uL — ABNORMAL HIGH (ref 0.00–0.07)
Basophils Absolute: 0.1 10*3/uL (ref 0.0–0.1)
Basophils Relative: 1 %
Eosinophils Absolute: 0.4 10*3/uL (ref 0.0–0.5)
Eosinophils Relative: 5 %
HCT: 39.1 % (ref 39.0–52.0)
Hemoglobin: 12.9 g/dL — ABNORMAL LOW (ref 13.0–17.0)
Immature Granulocytes: 2 %
Lymphocytes Relative: 9 %
Lymphs Abs: 0.8 10*3/uL (ref 0.7–4.0)
MCH: 32.1 pg (ref 26.0–34.0)
MCHC: 33 g/dL (ref 30.0–36.0)
MCV: 97.3 fL (ref 80.0–100.0)
Monocytes Absolute: 0.7 10*3/uL (ref 0.1–1.0)
Monocytes Relative: 9 %
Neutro Abs: 6.2 10*3/uL (ref 1.7–7.7)
Neutrophils Relative %: 74 %
Platelet Count: 208 10*3/uL (ref 150–400)
RBC: 4.02 MIL/uL — ABNORMAL LOW (ref 4.22–5.81)
RDW: 13.9 % (ref 11.5–15.5)
WBC Count: 8.3 10*3/uL (ref 4.0–10.5)
nRBC: 0 % (ref 0.0–0.2)

## 2019-01-02 LAB — TSH: TSH: 30.654 u[IU]/mL — ABNORMAL HIGH (ref 0.320–4.118)

## 2019-01-02 MED ORDER — SODIUM CHLORIDE 0.9% FLUSH
10.0000 mL | INTRAVENOUS | Status: DC | PRN
  Administered 2019-01-02: 10 mL
  Filled 2019-01-02: qty 10

## 2019-01-02 MED ORDER — SODIUM CHLORIDE 0.9% FLUSH
10.0000 mL | Freq: Once | INTRAVENOUS | Status: AC
  Administered 2019-01-02: 10 mL
  Filled 2019-01-02: qty 10

## 2019-01-02 MED ORDER — SODIUM CHLORIDE 0.9 % IV SOLN
Freq: Once | INTRAVENOUS | Status: AC
  Administered 2019-01-02: 12:00:00 via INTRAVENOUS
  Filled 2019-01-02: qty 250

## 2019-01-02 MED ORDER — HEPARIN SOD (PORK) LOCK FLUSH 100 UNIT/ML IV SOLN
500.0000 [IU] | Freq: Once | INTRAVENOUS | Status: AC | PRN
  Administered 2019-01-02: 500 [IU]
  Filled 2019-01-02: qty 5

## 2019-01-02 MED ORDER — SODIUM CHLORIDE 0.9 % IV SOLN
9.7000 mg/kg | Freq: Once | INTRAVENOUS | Status: AC
  Administered 2019-01-02: 1000 mg via INTRAVENOUS
  Filled 2019-01-02: qty 20

## 2019-01-02 NOTE — Patient Instructions (Signed)
[facility] Discharge Instructions for Patients Receiving Chemotherapy  Today you received the following chemotherapy agents: Durvalumab (Imfinzi)   To help prevent nausea and vomiting after your treatment, we encourage you to take your nausea medication as directed.   If you develop nausea and vomiting that is not controlled by your nausea medication, call the clinic.   BELOW ARE SYMPTOMS THAT SHOULD BE REPORTED IMMEDIATELY:  *FEVER GREATER THAN 100.5 F  *CHILLS WITH OR WITHOUT FEVER  NAUSEA AND VOMITING THAT IS NOT CONTROLLED WITH YOUR NAUSEA MEDICATION  *UNUSUAL SHORTNESS OF BREATH  *UNUSUAL BRUISING OR BLEEDING  TENDERNESS IN MOUTH AND THROAT WITH OR WITHOUT PRESENCE OF ULCERS  *URINARY PROBLEMS  *BOWEL PROBLEMS  UNUSUAL RASH Items with * indicate a potential emergency and should be followed up as soon as possible.  Feel free to call the clinic should you have any questions or concerns. The clinic phone number is [phone].  Please show the CHEMO ALERT CARD at check-in to the Emergency Department and triage nurse.   

## 2019-01-02 NOTE — Progress Notes (Signed)
[facility] OFFICE PROGRESS NOTE  Redmond School, MD [address] [place] [address]  DIAGNOSIS: stage IIIA (T3, N2, M0) non-small cell lung cancer, adenosquamous carcinoma diagnosed in June 2019 and presented with large right upper lobe lung mass with questionable chest wall invasion as well as right hilar and mediastinal lymphadenopathy.  Biomarker Findings Tumor Mutational Burden - TMB-High (21 Muts/Mb) Microsatellite status - MS-Stable Genomic Findings For a complete list of the genes assayed, please refer to the Appendix. ATM K0881* CCND2 P281R KRAS G12V CHEK2 T333f*15 TP53 E298* 7 Disease relevant genes with no reportable alterations: ALK, EGFR, BRAF, MET, RET, ERBB2, ROS1  PRIOR THERAPY: Concurrent chemoradiation with chemotherapy consisting of weekly carboplatin for an AUC of 2 and paclitaxel 45 mg/m2. First dose given on 04/03/2018.Status post 6 cycles.  Last dose was giving 05/15/2018 with partial response.  CURRENT THERAPY: Consolidation treatment with immunotherapy with Imfinzi (Durvalumab) 10 mg/KG every 2 weeks.  First dose 06/20/2018.  Status post 13 cycles.  INTERVAL HISTORY: Mark BELLUOMINI89y.o. male returns to the clinic today for a follow-up visit.  The patient is feeling well today without any concerning complaints. He denies any fever, chills, night sweats, weight loss, or appetite changes.  He denies any chest pain, shortness of breath, cough, or hemoptysis.  He denies any nausea, vomiting, or diarrhea. He takes stool softener for constipation. He denies any headache or visual changes.  He denies any rashes or skin changes. He continues to smoke approximately 1/2 a pack a day. He os tolerating treatment well without any adverse effects.  He is here today for evaluation before starting cycle #14.  MEDICAL HISTORY: Past Medical History:  Diagnosis Date  . Acute hepatitis C virus infection 06/19/2007   Qualifier: Diagnosis of  By: MLeilani MerlCMA,  Tiffany    . Acute ST elevation myocardial infarction (STEMI) involving left anterior descending (LAD) coronary artery ([facility] 05/26/2016  . Acute ST elevation myocardial infarction (STEMI) of lateral wall ([facility] 05/26/2016  . 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 Anxiety   . Asthma   . Atherosclerosis of native arteries of the extremities with intermittent claudication 12/16/2011  . Chronic hepatitis C without hepatic coma ([facility] 05/03/2014  . COPD with chronic bronchitis and emphysema ([facility] 03/13/2018   FEV1 57%  . Depression   . DVT (deep venous thrombosis) ([facility] 2009; 2019   RLE; LLE  . Encounter for antineoplastic chemotherapy 03/22/2018  . Hepatitis C    "tx'd in 2015"  . High cholesterol   . Ischemic cardiomyopathy 03/02/2018  . Non-small cell carcinoma of right lung, stage 3 ([facility] 03/22/2018  . PAD (peripheral artery disease) ([facility] 01/25/2013  . Peripheral vascular disease, unspecified 07/20/2012  . Port-A-Cath in place 04/24/2018  . ST elevation myocardial infarction involving left anterior descending (LAD) coronary artery ([facility]   . STEMI (ST elevation myocardial infarction) ([facility] 05/26/2016  . Tobacco abuse 01/28/2016  . Tubular adenoma of colon 07/11/2014  . Urinary dribbling     ALLERGIES:  is allergic to lyrica [pregabalin] and neurontin [gabapentin].  MEDICATIONS:  Current Outpatient Medications  Medication Sig Dispense Refill  . alprazolam (XANAX) 2 MG tablet Take 2 mg by mouth 4 (four) times daily as needed for anxiety.     .Marland Kitchenamitriptyline (ELAVIL) 100 MG tablet Take 100 mg by mouth at bedtime.     .Marland Kitchenaspirin 81 MG EC tablet Take 81 mg by mouth daily.      .Marland Kitchenatorvastatin (LIPITOR) 80 MG  tablet Take 1 tablet (80 mg total) by mouth daily at 6 PM. 30 tablet 12  . carvedilol (COREG) 6.25 MG tablet TAKE 1 TABLET BY MOUTH TWICE A DAY WITH A MEAL 60 tablet 8  . citalopram (CELEXA) 40 MG tablet Take 40 mg by mouth daily.  11  . furosemide (LASIX) 20 MG tablet  Take 1 tablet (20 mg total) by mouth daily. 90 tablet 3  . HYDROcodone-acetaminophen (NORCO) 10-325 MG tablet Take 1 tablet by mouth every 4 (four) hours as needed for moderate pain.   0  . levETIRAcetam (KEPPRA) 750 MG tablet Take 1 tablet (750 mg total) by mouth 2 (two) times daily. 60 tablet 5  . levothyroxine (SYNTHROID, LEVOTHROID) 125 MCG tablet TAKE 1 TABLET BY MOUTH EVERY DAY BEFORE BREAKFAST 30 tablet 0  . lidocaine-prilocaine (EMLA) cream Apply 1 application topically as needed. 30 g 0  . lisinopril (PRINIVIL,ZESTRIL) 2.5 MG tablet Take 1 tablet (2.5 mg total) by mouth daily. 30 tablet 12  . nitroGLYCERIN (NITROSTAT) 0.4 MG SL tablet Place 1 tablet (0.4 mg total) under the tongue every 5 (five) minutes x 3 doses as needed for chest pain. 25 tablet 2  . prochlorperazine (COMPAZINE) 10 MG tablet Take 1 tablet (10 mg total) by mouth every 6 (six) hours as needed for nausea or vomiting. 30 tablet 0  . spironolactone (ALDACTONE) 25 MG tablet TAKE 1 TABLET BY MOUTH EVERY DAY 90 tablet 3  . sucralfate (CARAFATE) 1 g tablet Take 1 tablet (1 g total) by mouth 4 (four) times daily -  with meals and at bedtime. 5 min before meals for radiation induced esophagitis 120 tablet 2  . tamsulosin (FLOMAX) 0.4 MG CAPS capsule Take 1 capsule by mouth daily.    . Tiotropium Bromide-Olodaterol (STIOLTO RESPIMAT) 2.5-2.5 MCG/ACT AERS Inhale 2 puffs into the lungs daily. 1 Inhaler 5  . umeclidinium-vilanterol (ANORO ELLIPTA) 62.5-25 MCG/INH AEPB Inhale 1 puff into the lungs daily. 1 each 0   No current facility-administered medications for this visit.    Facility-Administered Medications Ordered in Other Visits  Medication Dose Route Frequency Provider Last Rate Last Dose  . durvalumab (IMFINZI) 1,000 mg in sodium chloride 0.9 % 100 mL chemo infusion  9.7 mg/kg (Treatment Plan Recorded) Intravenous Once Curt Bears, MD      . heparin lock flush 100 unit/mL  500 Units Intracatheter Once PRN Curt Bears, MD      . sodium chloride flush (NS) 0.9 % injection 10 mL  10 mL Intracatheter PRN Curt Bears, MD        SURGICAL HISTORY:  Past Surgical History:  Procedure Laterality Date  . AORTOGRAM  08/08/2009   for LLE claudication     By Dr. Oneida Alar  . 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 BIOPSY N/A 05/30/2014   Procedure: BIOPSY;  Surgeon: [name], MD;  Location: [facility];  Service: Endoscopy;  Laterality: N/A;  . BLADDER TUMOR EXCISION  8/812  . CARDIAC CATHETERIZATION N/A 05/26/2016   Procedure: Left Heart Cath and Coronary Angiography;  Surgeon: [name], MD;  Location: [facility];  Service: Cardiovascular;  Laterality: N/A;  . CARDIAC CATHETERIZATION N/A 05/26/2016   Procedure: Coronary Stent Intervention;  Surgeon: [name], MD;  Location: [facility];  Service: Cardiovascular;  Laterality: N/A;  . COLONOSCOPY WITH PROPOFOL N/A 05/30/2014   ZLD:JTTSVX colonic polyp-likely source of hematochezia-removed as described above  . ESOPHAGOGASTRODUODENOSCOPY (EGD) WITH PROPOFOL N/A 05/30/2014  MGQ:QPYPPJ and bulbar erosions s/p gastric biopsy. No evidence of portal gastropathy on today's examination.  . FEMORAL-TIBIAL BYPASS GRAFT  2009   Right side using non-reversed GSV   By Dr. Oneida Alar  . FEMORAL-TIBIAL BYPASS GRAFT  11/24/2007   Right femoral to anterior tibial BPG   by Dr. Oneida Alar  . FINGER SURGERY Left    "straightened my pinky"  . HAND TENDON SURGERY Left 2013   Left 5th finger  . HERNIA REPAIR     "stomach"  . ICD IMPLANT N/A 03/02/2018   Procedure: ICD IMPLANT;  Surgeon: [name], MD;  Location: [facility];  Service: Cardiovascular;  Laterality: N/A;  . INCISIONAL HERNIA REPAIR N/A 12/25/2014   Procedure: HERNIA REPAIR INCISIONAL WITH MESH;  Surgeon: [name], MD;  Location: [facility];  Service: General;  Laterality: N/A;  . INSERTION OF MESH N/A 12/25/2014   Procedure: INSERTION OF MESH;  Surgeon: [name],  MD;  Location: [facility];  Service: General;  Laterality: N/A;  . IR IMAGING GUIDED PORT INSERTION  04/11/2018  . LAPAROSCOPIC CHOLECYSTECTOMY    . LOWER EXTREMITY ANGIOGRAM Left 12/30/2015   Procedure: Lower Extremity Angiogram;  Surgeon: [name], MD;  Location: [facility];  Service: Cardiovascular;  Laterality: Left;  . PERIPHERAL VASCULAR CATHETERIZATION N/A 12/30/2015   Procedure: Abdominal Aortogram;  Surgeon: [name], MD;  Location: [facility];  Service: Cardiovascular;  Laterality: N/A;  . POLYPECTOMY  05/30/2014   Procedure: POLYPECTOMY;  Surgeon: [name], MD;  Location: [facility];  Service: Endoscopy;;  . TONSILLECTOMY AND ADENOIDECTOMY  ~ 1970  . TYMPANOSTOMY TUBE PLACEMENT Bilateral ~ 1970    REVIEW OF SYSTEMS:   Review of Systems  Constitutional: Negative for appetite change, chills, fatigue, fever and unexpected weight change.  HENT:   Negative for mouth sores, nosebleeds, sore throat and trouble swallowing.   Eyes: Negative for eye problems and icterus.  Respiratory:Positive for baseline shortness of breath. Negative for cough, hemoptysis, and wheezing.   Cardiovascular: Negative for chest pain and leg swelling.  Gastrointestinal: Positive for constipation. Negative for abdominal pain, diarrhea, nausea and vomiting.  Genitourinary: Negative for bladder incontinence, difficulty urinating, dysuria, frequency and hematuria.   Musculoskeletal: Negative for back pain,  neck pain and neck stiffness.  Skin: Negative for itching and rash.  Neurological: Negative for dizziness, extremity weakness, gait problem, headaches, or light-headedness.  Hematological: Negative for adenopathy. Does not bruise/bleed easily.  Psychiatric/Behavioral: Negative for confusion, depression and sleep disturbance. The patient is not nervous/anxious.     PHYSICAL EXAMINATION:  Blood pressure 102/66, pulse 69, temperature 97.6 F (36.4 C), temperature source Oral, resp. rate  18, height _0  (1.854 m), weight 230 lb 14.4 oz (104.7 kg), SpO2 100 %.  ECOG PERFORMANCE STATUS: 1 - Symptomatic but completely ambulatory  Physical Exam  Constitutional: Oriented to person, place, and time and well-developed, well-nourished, and in no distress. The patient was examined in his wheelchair.  HENT:  Head: Normocephalic and atraumatic.  Mouth/Throat: Oropharynx is clear and moist. No oropharyngeal exudate.  Eyes: Conjunctivae are normal. Right eye exhibits no discharge. Left eye exhibits no discharge. No scleral icterus.  Neck: Normal range of motion. Neck supple.  Cardiovascular: Normal rate, regular rhythm, normal heart sounds and intact distal pulses.   Pulmonary/Chest: Expiratory wheezing. Effort normal. No respiratory distress. No wheezes. No rales.  Abdominal: Soft. Bowel sounds are normal. Exhibits no distension and no mass. There is no  tenderness.  Musculoskeletal: Normal range of motion. Exhibits no edema. Has a BKA of the right lower extremity. Lymphadenopathy:    No cervical adenopathy.  Neurological: Alert and oriented to person, place, and time. Exhibits normal muscle tone. Gait normal. Coordination normal.  Skin: Skin is warm and dry. No rash noted. Not diaphoretic. No erythema. No pallor.  Psychiatric: Mood, memory and judgment normal.  Vitals reviewed.  LABORATORY DATA: Lab Results  Component Value Date   WBC 8.3 01/02/2019   HGB 12.9 (L) 01/02/2019   HCT 39.1 01/02/2019   MCV 97.3 01/02/2019   PLT 208 01/02/2019      Chemistry      Component Value Date/Time   NA 135 01/02/2019 0937   NA 139 02/27/2018 1457   K 4.2 01/02/2019 0937   CL 96 (L) 01/02/2019 0937   CO2 32 01/02/2019 0937   BUN 10 01/02/2019 0937   BUN 7 02/27/2018 1457   CREATININE 1.25 (H) 01/02/2019 0937   CREATININE 1.13 06/28/2016 1453      Component Value Date/Time   CALCIUM 9.0 01/02/2019 0937   ALKPHOS 136 (H) 01/02/2019 0937   AST 23 01/02/2019 0937   ALT 29  01/02/2019 0937   BILITOT 0.5 01/02/2019 0937       RADIOGRAPHIC STUDIES:  No results found.   ASSESSMENT/PLAN:  This is a very pleasant 58 year old Caucasian male with stage IIIa non-small cell lung cancer, adenosquamous carcinoma.  He presented with a large right upper lobe lung mass with questionable chest wall invasion as well as right hilar and mediastinal lymphadenopathy.  He was diagnosed in June 2019.  He completed 6 cycles of concurrent chemoradiation with carboplatin and paclitaxel.  He had a partial response.  The patient is currently undergoing consolidation immunotherapy with Imfinzi.  He is status post 13 cycles.  Continues to tolerate treatment well without any adverse effects.  The patient was seen with Dr. Julien Nordmann today.  Labs were reviewed with the patient. His TSH continues to be elevated, however, his synthroid was recently adjusted from 88 mcg to 125 mcg and his TSH is trending downwards. We will continue to monitor TSH on routine labs and will make adjustments to his synthroid in 6-8 weeks from the start date of his new dose, if necessary. We recommend he proceed with cycle #14 today as scheduled.  I will arrange for a restaging CT scan to be performed prior to the next visit. I will see him back in 2 weeks for evaluation and to review his scan results prior to starting cycle #15.  He will continue to take stool softener for his constipation.   The patient was strongly encouraged to quit smoking. I spent some time counseling the patient on the importance of tobacco cessation. He is currently not interested in quitting now.  The patient was advised to call immediately if he has any concerning symptoms in the interval. The patient voices understanding of current disease status and treatment options and is in agreement with the current care plan. All questions were answered. The patient knows to call the clinic with any problems, questions or concerns. We can certainly  see the patient much sooner if necessary  Orders Placed This Encounter  Procedures  . CT Chest W Contrast    Standing Status:   Future    Standing Expiration Date:   01/02/2020    Order Specific Question:   ** REASON FOR EXAM (FREE TEXT)    Answer:   Restaging Lung Cancer  Order Specific Question:   If indicated for the ordered procedure, I authorize the administration of contrast media per Radiology protocol    Answer:   Yes    Order Specific Question:   Preferred imaging location?    Answer:   [facility]    Order Specific Question:   Radiology Contrast Protocol - do NOT remove file path    Answer:   [email or web address]     Mark Giambra L Adrina Armijo, PA-C 01/02/19  ADDENDUM: Hematology/Oncology Attending: I had a face-to-face encounter with the patient today.  I recommended his care plan.  This is a very pleasant 58 years old white male with a stage IIIa non-small cell lung cancer, adenosquamous carcinoma status post a course of concurrent chemoradiation with partial response.  He is currently undergoing consolidation immunotherapy with Imfinzi status post 13 cycles.  The patient has been tolerating this treatment well with no concerning adverse effects. I recommended for him to proceed with cycle #14 today. I will see the patient back for follow-up visit in 2 weeks for evaluation with the next cycle of his treatment after repeating CT scan of the chest for restaging of his disease. I strongly advised him to quit smoking. The patient was advised to call immediately if he has any concerning symptoms in the interval.  Disclaimer: This note was dictated with voice recognition software. Similar sounding words can inadvertently be transcribed and may be missed upon review. Eilleen Kempf, MD 01/02/19

## 2019-01-02 NOTE — Telephone Encounter (Signed)
Patient called from recall list, past due for follow up visit with Dr Jola Baptist to try to arrange virtual visit. Left message to call back

## 2019-01-03 ENCOUNTER — Telehealth: Payer: Self-pay | Admitting: Physician Assistant

## 2019-01-03 NOTE — Telephone Encounter (Signed)
Called regarding 4/20

## 2019-01-04 ENCOUNTER — Encounter: Payer: Self-pay | Admitting: General Practice

## 2019-01-04 NOTE — Progress Notes (Signed)
[facility] Team contacted patient to assess for food insecurity and other psychosocial needs during current COVID19 pandemic.  "The only thing he really needs is a new wheelchair, brakes dont work on it, everything is wrong w it, makes him fall a lot."   Patient/family expressed no needs at this time.  Support Team member encouraged patient to call if changes occur or they have any other questions/concerns.    Beverely Pace, [place]

## 2019-01-12 ENCOUNTER — Telehealth: Payer: Self-pay | Admitting: *Deleted

## 2019-01-12 ENCOUNTER — Other Ambulatory Visit: Payer: Self-pay

## 2019-01-12 ENCOUNTER — Ambulatory Visit: Admission: RE | Admit: 2019-01-12 | Payer: Medicare Other | Source: Ambulatory Visit

## 2019-01-12 ENCOUNTER — Ambulatory Visit (HOSPITAL_COMMUNITY)
Admission: RE | Admit: 2019-01-12 | Discharge: 2019-01-12 | Disposition: A | Discharge status: Home / Self Care | Payer: Medicare Other | Source: Ambulatory Visit | Attending: Physician Assistant | Admitting: Physician Assistant

## 2019-01-12 DIAGNOSIS — C3491 Malignant neoplasm of unspecified part of right bronchus or lung: Secondary | ICD-10-CM | POA: Diagnosis not present

## 2019-01-12 DIAGNOSIS — C3490 Malignant neoplasm of unspecified part of unspecified bronchus or lung: Secondary | ICD-10-CM | POA: Diagnosis not present

## 2019-01-12 MED ORDER — HEPARIN SOD (PORK) LOCK FLUSH 100 UNIT/ML IV SOLN
500.0000 [IU] | INTRAVENOUS | Status: AC | PRN
  Administered 2019-01-12: 500 [IU]

## 2019-01-12 MED ORDER — IOPAMIDOL (ISOVUE-300) INJECTION 61%
75.0000 mL | Freq: Once | INTRAVENOUS | Status: AC | PRN
  Administered 2019-01-12: 75 mL via INTRAVENOUS

## 2019-01-12 MED ORDER — SODIUM CHLORIDE 0.9% FLUSH: 10.0000 mL | INTRAVENOUS | Status: DC | PRN

## 2019-01-12 NOTE — Telephone Encounter (Signed)
Received call from pt's wife. She is requesting to be called either during or after pt's appt with Dr. Thad Ranger Heilingoetter, PA on 01/15/19.  She states pt is getting forgetful and doesn't think he will be able to recount his MD visit accurately to her.  Her # is [phone]

## 2019-01-15 ENCOUNTER — Inpatient Hospital Stay: Payer: Medicare Other

## 2019-01-15 ENCOUNTER — Encounter: Payer: Self-pay | Admitting: Physician Assistant

## 2019-01-15 ENCOUNTER — Other Ambulatory Visit: Payer: Self-pay

## 2019-01-15 ENCOUNTER — Inpatient Hospital Stay (HOSPITAL_BASED_OUTPATIENT_CLINIC_OR_DEPARTMENT_OTHER): Payer: Medicare Other | Admitting: Physician Assistant

## 2019-01-15 VITALS — BP 110/81 | HR 78 | Temp 97.7°F | Resp 18 | Ht 73.0 in | Wt 229.8 lb

## 2019-01-15 DIAGNOSIS — Z5112 Encounter for antineoplastic immunotherapy: Secondary | ICD-10-CM | POA: Diagnosis not present

## 2019-01-15 DIAGNOSIS — C3491 Malignant neoplasm of unspecified part of right bronchus or lung: Secondary | ICD-10-CM

## 2019-01-15 DIAGNOSIS — Z95828 Presence of other vascular implants and grafts: Secondary | ICD-10-CM

## 2019-01-15 DIAGNOSIS — Z79899 Other long term (current) drug therapy: Secondary | ICD-10-CM | POA: Diagnosis not present

## 2019-01-15 DIAGNOSIS — Z720 Tobacco use: Secondary | ICD-10-CM

## 2019-01-15 DIAGNOSIS — C3411 Malignant neoplasm of upper lobe, right bronchus or lung: Secondary | ICD-10-CM

## 2019-01-15 DIAGNOSIS — R7989 Other specified abnormal findings of blood chemistry: Secondary | ICD-10-CM

## 2019-01-15 LAB — CBC WITH DIFFERENTIAL (CANCER CENTER ONLY)
Abs Immature Granulocytes: 0.08 10*3/uL — ABNORMAL HIGH (ref 0.00–0.07)
Basophils Absolute: 0.1 10*3/uL (ref 0.0–0.1)
Basophils Relative: 1 %
Eosinophils Absolute: 0.3 10*3/uL (ref 0.0–0.5)
Eosinophils Relative: 5 %
HCT: 41.4 % (ref 39.0–52.0)
Hemoglobin: 13.4 g/dL (ref 13.0–17.0)
Immature Granulocytes: 1 %
Lymphocytes Relative: 12 %
Lymphs Abs: 0.9 10*3/uL (ref 0.7–4.0)
MCH: 31.6 pg (ref 26.0–34.0)
MCHC: 32.4 g/dL (ref 30.0–36.0)
MCV: 97.6 fL (ref 80.0–100.0)
Monocytes Absolute: 0.7 10*3/uL (ref 0.1–1.0)
Monocytes Relative: 10 %
Neutro Abs: 4.9 10*3/uL (ref 1.7–7.7)
Neutrophils Relative %: 71 %
Platelet Count: 194 10*3/uL (ref 150–400)
RBC: 4.24 MIL/uL (ref 4.22–5.81)
RDW: 13.5 % (ref 11.5–15.5)
WBC Count: 6.9 10*3/uL (ref 4.0–10.5)
nRBC: 0 % (ref 0.0–0.2)

## 2019-01-15 LAB — CMP (CANCER CENTER ONLY)
ALT: 36 U/L (ref 0–44)
AST: 25 U/L (ref 15–41)
Albumin: 4.2 g/dL (ref 3.5–5.0)
Alkaline Phosphatase: 141 U/L — ABNORMAL HIGH (ref 38–126)
Anion gap: 10 (ref 5–15)
BUN: 11 mg/dL (ref 6–20)
CO2: 29 mmol/L (ref 22–32)
Calcium: 9 mg/dL (ref 8.9–10.3)
Chloride: 96 mmol/L — ABNORMAL LOW (ref 98–111)
Creatinine: 1.17 mg/dL (ref 0.61–1.24)
GFR, Est AFR Am: >60 mL/min (ref >60)
GFR, Estimated: >60 mL/min (ref >60)
Glucose, Bld: 80 mg/dL (ref 70–99)
Potassium: 4.4 mmol/L (ref 3.5–5.1)
Sodium: 135 mmol/L (ref 135–145)
Total Bilirubin: 0.4 mg/dL (ref 0.3–1.2)
Total Protein: 7.9 g/dL (ref 6.5–8.1)

## 2019-01-15 LAB — TSH: TSH: 28.306 u[IU]/mL — ABNORMAL HIGH (ref 0.320–4.118)

## 2019-01-15 MED ORDER — SODIUM CHLORIDE 0.9% FLUSH
10.0000 mL | Freq: Once | INTRAVENOUS | Status: AC
  Administered 2019-01-15: 10 mL
  Filled 2019-01-15: qty 10

## 2019-01-15 MED ORDER — SODIUM CHLORIDE 0.9 % IV SOLN
9.7000 mg/kg | Freq: Once | INTRAVENOUS | Status: AC
  Administered 2019-01-15: 1000 mg via INTRAVENOUS
  Filled 2019-01-15: qty 20

## 2019-01-15 MED ORDER — LEVOTHYROXINE SODIUM 125 MCG PO TABS: ORAL_TABLET | ORAL | 0 refills | Status: DC

## 2019-01-15 MED ORDER — SODIUM CHLORIDE 0.9 % IV SOLN
Freq: Once | INTRAVENOUS | Status: AC
  Administered 2019-01-15: 14:00:00 via INTRAVENOUS
  Filled 2019-01-15: qty 250

## 2019-01-15 MED ORDER — HEPARIN SOD (PORK) LOCK FLUSH 100 UNIT/ML IV SOLN
500.0000 [IU] | Freq: Once | INTRAVENOUS | Status: AC | PRN
  Administered 2019-01-15: 500 [IU]
  Filled 2019-01-15: qty 5

## 2019-01-15 MED ORDER — SODIUM CHLORIDE 0.9% FLUSH
10.0000 mL | INTRAVENOUS | Status: DC | PRN
  Administered 2019-01-15: 10 mL
  Filled 2019-01-15: qty 10

## 2019-01-15 NOTE — Progress Notes (Signed)
[facility] OFFICE PROGRESS NOTE  Redmond School, MD [address] [place] [address]  DIAGNOSIS: Stage IIIA (T3, N2, M0) non-small cell lung cancer, adenosquamous carcinoma diagnosed in June 2019 and presented with large right upper lobe lung mass with questionable chest wall invasion as well as right hilar and mediastinal lymphadenopathy.  Biomarker Findings Tumor Mutational Burden - TMB-High (21 Muts/Mb) Microsatellite status - MS-Stable Genomic Findings For a complete list of the genes assayed, please refer to the Appendix. ATM Y1950* CCND2 P281R KRAS G12V CHEK2 T340f*15 TP53 E298* 7 Disease relevant genes with no reportable alterations: ALK, EGFR, BRAF, MET, RET, ERBB2, ROS1  PRIOR THERAPY: Concurrent chemoradiation with chemotherapy consisting of weekly carboplatin for an AUC of 2 and paclitaxel 45 mg/m2. First dose given on 04/03/2018.Status post 6 cycles. Last dose was giving 05/15/2018 with partial response.  CURRENT THERAPY: Consolidation treatment with immunotherapy with Imfinzi (Durvalumab) 10 mg/KG every 2 weeks. First dose 06/20/2018. Status post 14cycles.  INTERVAL HISTORY: Mark CHAPPUIS58y.o. male returns to the clinic today for a follow-up visit.  The patient is feeling fine today without any concerning complaints.  He denies any fever, chills, night sweats, or weight loss.  He denies any chest pain, shortness of breath, cough, or hemoptysis.  He denies any nausea, vomiting, diarrhea, or constipation.  He takes a stool softener regularly.  He denies any headaches or visual changes.  He denies any rashes or skin changes.  He continues to tolerate treatment well without any adverse effects.  The patient, unfortunately, continues to smoke a half of a pack of cigarettes a day. He recently had a restaging CT scan performed.  He is here today for evaluation and to review the scan results prior to starting cycle #15.   MEDICAL HISTORY: Past Medical  History:  Diagnosis Date  . Acute hepatitis C virus infection 06/19/2007   Qualifier: Diagnosis of  By: MLeilani MerlCMA, Tiffany    . Acute ST elevation myocardial infarction (STEMI) involving left anterior descending (LAD) coronary artery ([facility] 05/26/2016  . Acute ST elevation myocardial infarction (STEMI) of lateral wall ([facility] 05/26/2016  . 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 Anxiety   . Asthma   . Atherosclerosis of native arteries of the extremities with intermittent claudication 12/16/2011  . Chronic hepatitis C without hepatic coma ([facility] 05/03/2014  . COPD with chronic bronchitis and emphysema ([facility] 03/13/2018   FEV1 57%  . Depression   . DVT (deep venous thrombosis) ([facility] 2009; 2019   RLE; LLE  . Encounter for antineoplastic chemotherapy 03/22/2018  . Hepatitis C    "tx'd in 2015"  . High cholesterol   . Ischemic cardiomyopathy 03/02/2018  . Non-small cell carcinoma of right lung, stage 3 ([facility] 03/22/2018  . PAD (peripheral artery disease) ([facility] 01/25/2013  . Peripheral vascular disease, unspecified 07/20/2012  . Port-A-Cath in place 04/24/2018  . ST elevation myocardial infarction involving left anterior descending (LAD) coronary artery ([facility]   . STEMI (ST elevation myocardial infarction) ([facility] 05/26/2016  . Tobacco abuse 01/28/2016  . Tubular adenoma of colon 07/11/2014  . Urinary dribbling     ALLERGIES:  is allergic to lyrica [pregabalin] and neurontin [gabapentin].  MEDICATIONS:  Current Outpatient Medications  Medication Sig Dispense Refill  . alprazolam (XANAX) 2 MG tablet Take 2 mg by mouth 4 (four) times daily as needed for anxiety.     .Marland Kitchenamitriptyline (ELAVIL) 100 MG tablet Take 100 mg by mouth at bedtime.     .[name]  aspirin 81 MG EC tablet Take 81 mg by mouth daily.      [name] atorvastatin (LIPITOR) 80 MG tablet Take 1 tablet (80 mg total) by mouth daily at 6 PM. 30 tablet 12  . carvedilol (COREG) 6.25 MG tablet TAKE 1 TABLET BY MOUTH TWICE A DAY WITH A MEAL  60 tablet 8  . citalopram (CELEXA) 40 MG tablet Take 40 mg by mouth daily.  11  . furosemide (LASIX) 20 MG tablet Take 1 tablet (20 mg total) by mouth daily. 90 tablet 3  . HYDROcodone-acetaminophen (NORCO) 10-325 MG tablet Take 1 tablet by mouth every 4 (four) hours as needed for moderate pain.   0  . levETIRAcetam (KEPPRA) 750 MG tablet Take 1 tablet (750 mg total) by mouth 2 (two) times daily. 60 tablet 5  . levothyroxine (SYNTHROID) 125 MCG tablet TAKE 1 TABLET BY MOUTH EVERY DAY BEFORE BREAKFAST 30 tablet 0  . lidocaine-prilocaine (EMLA) cream Apply 1 application topically as needed. 30 g 0  . lisinopril (PRINIVIL,ZESTRIL) 2.5 MG tablet Take 1 tablet (2.5 mg total) by mouth daily. 30 tablet 12  . nitroGLYCERIN (NITROSTAT) 0.4 MG SL tablet Place 1 tablet (0.4 mg total) under the tongue every 5 (five) minutes x 3 doses as needed for chest pain. 25 tablet 2  . prochlorperazine (COMPAZINE) 10 MG tablet Take 1 tablet (10 mg total) by mouth every 6 (six) hours as needed for nausea or vomiting. 30 tablet 0  . spironolactone (ALDACTONE) 25 MG tablet TAKE 1 TABLET BY MOUTH EVERY DAY 90 tablet 3  . sucralfate (CARAFATE) 1 g tablet Take 1 tablet (1 g total) by mouth 4 (four) times daily -  with meals and at bedtime. 5 min before meals for radiation induced esophagitis 120 tablet 2  . tamsulosin (FLOMAX) 0.4 MG CAPS capsule Take 1 capsule by mouth daily.    . Tiotropium Bromide-Olodaterol (STIOLTO RESPIMAT) 2.5-2.5 MCG/ACT AERS Inhale 2 puffs into the lungs daily. 1 Inhaler 5  . umeclidinium-vilanterol (ANORO ELLIPTA) 62.5-25 MCG/INH AEPB Inhale 1 puff into the lungs daily. 1 each 0   No current facility-administered medications for this visit.    Facility-Administered Medications Ordered in Other Visits  Medication Dose Route Frequency Provider Last Rate Last Dose  . sodium chloride flush (NS) 0.9 % injection 10 mL  10 mL Intracatheter PRN Curt Bears, MD   10 mL at 01/15/19 1556    SURGICAL  HISTORY:  Past Surgical History:  Procedure Laterality Date  . AORTOGRAM  08/08/2009   for LLE claudication     By Dr. Oneida Alar  . 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 BIOPSY N/A 05/30/2014   Procedure: BIOPSY;  Surgeon: [name], MD;  Location: [facility];  Service: Endoscopy;  Laterality: N/A;  . BLADDER TUMOR EXCISION  8/812  . CARDIAC CATHETERIZATION N/A 05/26/2016   Procedure: Left Heart Cath and Coronary Angiography;  Surgeon: [name], MD;  Location: [facility];  Service: Cardiovascular;  Laterality: N/A;  . CARDIAC CATHETERIZATION N/A 05/26/2016   Procedure: Coronary Stent Intervention;  Surgeon: [name], MD;  Location: [facility];  Service: Cardiovascular;  Laterality: N/A;  . COLONOSCOPY WITH PROPOFOL N/A 05/30/2014   MGQ:QPYPPJ colonic polyp-likely source of hematochezia-removed as described above  . ESOPHAGOGASTRODUODENOSCOPY (EGD) WITH PROPOFOL N/A 05/30/2014   KDT:OIZTIW and bulbar erosions s/p gastric biopsy. No evidence of portal gastropathy on today's examination.  . FEMORAL-TIBIAL BYPASS GRAFT  2009   Right side  using non-reversed GSV   By Dr. Oneida Alar  . FEMORAL-TIBIAL BYPASS GRAFT  11/24/2007   Right femoral to anterior tibial BPG   by Dr. Oneida Alar  . FINGER SURGERY Left    "straightened my pinky"  . HAND TENDON SURGERY Left 2013   Left 5th finger  . HERNIA REPAIR     "stomach"  . ICD IMPLANT N/A 03/02/2018   Procedure: ICD IMPLANT;  Surgeon: [name], MD;  Location: [facility];  Service: Cardiovascular;  Laterality: N/A;  . INCISIONAL HERNIA REPAIR N/A 12/25/2014   Procedure: HERNIA REPAIR INCISIONAL WITH MESH;  Surgeon: [name], MD;  Location: [facility];  Service: General;  Laterality: N/A;  . INSERTION OF MESH N/A 12/25/2014   Procedure: INSERTION OF MESH;  Surgeon: [name], MD;  Location: [facility];  Service: General;  Laterality: N/A;  . IR IMAGING GUIDED PORT INSERTION  04/11/2018  . LAPAROSCOPIC  CHOLECYSTECTOMY    . LOWER EXTREMITY ANGIOGRAM Left 12/30/2015   Procedure: Lower Extremity Angiogram;  Surgeon: [name], MD;  Location: [facility];  Service: Cardiovascular;  Laterality: Left;  . PERIPHERAL VASCULAR CATHETERIZATION N/A 12/30/2015   Procedure: Abdominal Aortogram;  Surgeon: [name], MD;  Location: [facility];  Service: Cardiovascular;  Laterality: N/A;  . POLYPECTOMY  05/30/2014   Procedure: POLYPECTOMY;  Surgeon: [name], MD;  Location: [facility];  Service: Endoscopy;;  . TONSILLECTOMY AND ADENOIDECTOMY  ~ 1970  . TYMPANOSTOMY TUBE PLACEMENT Bilateral ~ 1970    REVIEW OF SYSTEMS:   Review of Systems  Constitutional: Negative for appetite change, chills, fatigue, fever and unexpected weight change.  HENT:  Negative for mouth sores, nosebleeds, sore throat and trouble swallowing.   Eyes: Negative for eye problems and icterus.  Respiratory: Negative for cough, hemoptysis, shortness of breath and wheezing.   Cardiovascular: Negative for chest pain and leg swelling.  Gastrointestinal: Positive for occasional constipation. Negative for abdominal pain, diarrhea, nausea and vomiting.  Genitourinary: Negative for bladder incontinence, difficulty urinating, dysuria, frequency and hematuria.   Musculoskeletal: Negative for back pain, gait problem, neck pain and neck stiffness.  Skin: Negative for itching and rash.  Neurological: Negative for dizziness, extremity weakness, gait problem, headaches, light-headedness and seizures.  Hematological: Negative for adenopathy. Does not bruise/bleed easily.  Psychiatric/Behavioral: Negative for confusion, depression and sleep disturbance. The patient is not nervous/anxious.     PHYSICAL EXAMINATION:  Blood pressure 110/81, pulse 78, temperature 97.7 F (36.5 C), temperature source Oral, resp. rate 18, height '6\' 1"'$  (1.854 m), weight 229 lb 12.8 oz (104.2 kg), SpO2 100 %.  ECOG PERFORMANCE STATUS: 1 -  Symptomatic but completely ambulatory  Physical Exam  Constitutional: Oriented to person, place, and time and well-developed, well-nourished, and in no distress.  HENT:  Head: Normocephalic and atraumatic.  Mouth/Throat: Oropharynx is clear and moist. No oropharyngeal exudate.  Eyes: Conjunctivae are normal. Right eye exhibits no discharge. Left eye exhibits no discharge. No scleral icterus.  Neck: Normal range of motion. Neck supple.  Cardiovascular: Normal rate, regular rhythm, normal heart sounds and intact distal pulses.   Pulmonary/Chest: Effort normal and Expiratory wheezing. No respiratory distress.  No rales.  Abdominal: Soft. Bowel sounds are normal. Exhibits no distension and no mass. There is no tenderness.  Musculoskeletal: Right BKA. Normal range of motion. Exhibits no edema.  Lymphadenopathy:    No cervical adenopathy.  Neurological: Alert and oriented to person, place, and time. Exhibits normal muscle  tone. Gait normal. Coordination normal.  Skin: Skin is warm and dry. No rash noted. Not diaphoretic. No erythema. No pallor.  Psychiatric: Mood, memory and judgment normal.  Vitals reviewed.  LABORATORY DATA: Lab Results  Component Value Date   WBC 6.9 01/15/2019   HGB 13.4 01/15/2019   HCT 41.4 01/15/2019   MCV 97.6 01/15/2019   PLT 194 01/15/2019      Chemistry      Component Value Date/Time   NA 135 01/15/2019 1217   NA 139 02/27/2018 1457   K 4.4 01/15/2019 1217   CL 96 (L) 01/15/2019 1217   CO2 29 01/15/2019 1217   BUN 11 01/15/2019 1217   BUN 7 02/27/2018 1457   CREATININE 1.17 01/15/2019 1217   CREATININE 1.13 06/28/2016 1453      Component Value Date/Time   CALCIUM 9.0 01/15/2019 1217   ALKPHOS 141 (H) 01/15/2019 1217   AST 25 01/15/2019 1217   ALT 36 01/15/2019 1217   BILITOT 0.4 01/15/2019 1217       RADIOGRAPHIC STUDIES:  Ct Chest W Contrast  Result Date: 01/12/2019 CLINICAL DATA:  58 year old male with right upper lobe non-small cell  carcinoma EXAM: CT CHEST WITH CONTRAST TECHNIQUE: Multidetector CT imaging of the chest was performed during intravenous contrast administration. CONTRAST:  90m ISOVUE-300 IOPAMIDOL (ISOVUE-300) INJECTION 61% COMPARISON:  09/18/2018, 06/09/2018, 03/21/2018, PET-CT 03/10/2018 FINDINGS: Cardiovascular: Heart size within normal limits. Dense calcifications of the left anterior descending coronary artery. Pacing device on the left chest wall with lead terminating in the right atrium and apex of the right ventricle. Unremarkable course caliber and contour of the thoracic aorta. Calcifications of the abdominal aorta. Mediastinum/Nodes: Similar appearance of small mediastinal lymph nodes, with index node in the subcarinal nodal station measuring 8 mm, essentially unchanged from the prior. No new or enlarging mediastinal lymph nodes. Unremarkable thoracic inlet. Right IJ port catheter terminates at the superior vena cava. Unremarkable thoracic esophagus. Lungs/Pleura: Redemonstration of treatment effects of known right upper lobe mass. Greatest diameter on the previous measures 5.2 cm. Greatest diameter on the current measures 5.1 cm, essentially unchanged. There is persistent broad-based attachment to the pleura and no evidence of cavitation. There are multiple subcentimeter ill-defined nodules of the right upper lobe, right middle lobe, and right lower lobe. There are a few left-sided similar subcentimeter ill-defined nodules, though certainly not as many as on the right. Upper Abdomen: No acute finding of the upper abdomen. Musculoskeletal: No acute displaced fracture. No aggressive bony lesions. IMPRESSION: Redemonstration of treatment effects of known right upper lobe non-small cell carcinoma, with unchanged size/configuration from the prior. There has been further development of multiple subcentimeter ill-defined nodules, which are predominantly right-sided involving right upper lobe, right middle lobe, right lower  lobe. The distribution is predominantly centrilobular. While inflammatory/infectious process could have this appearance, it is conspicuously right-sided, which would suggest postobstructive process. Alternatively, developing metastatic disease could have this appearance, and short-term follow-up CT imaging is recommended. Coronary artery disease and aortic atherosclerosis. Aortic Atherosclerosis (ICD10-I70.0). Electronically Signed   By: JCorrie MckusickD.O.   On: 01/12/2019 16:35     ASSESSMENT/PLAN:  This is a very pleasant 58year old Caucasian male with stage IIIa non-small cell lung cancer, adeno squamous carcinoma.  Presented with a large right upper lobe lung mass with questionable chest wall invasion as well as right hilar and mediastinal lymphadenopathy.  He was diagnosed in June 2019. He completed 6 cycles of concurrent chemoradiation with carboplatin and paclitaxel.  He had  a partial response. The patient is currently undergoing consolidation immunotherapy with Imfinzi 10 mg/kg IV every 2 weeks.  He is status post 14 cycles.  He continues to tolerate treatment well without any adverse effects.   The patient had a restaging CT scan performed.  Dr. Julien Nordmann personally and independently reviewed the scan and discussed the scan with the patient today. The scan shows stable appearance of the known right upper lobe lesion. There is some new ill defined nodules which could represent an inflammatory/infectious process. Dr. Julien Nordmann recommends continuing on the same treatment with close follow up on subsequent scans. He will receive cycle #15 today as scheduled.   His TSH continues to be elevated, however, his synthroid was recently adjusted from 88 mcg to 125 mcg on 12/15/2018. His TSH trended downwards significantly initially from 177-->30 over a two week period; however, today it was still elevated at 28. Upon further investigation, it was discovered that the patient recently started taking an old  prescription of 88 mcg again on accident. I sent a refill for 125 mcg to the patient's pharmacy and instructed them to ensure that they are taking the correct dose. We will continue to monitor TSH on routine labs and will make adjustments to his synthroid in 6-8 weeks from the start date of his new dose, if necessary.   I will see the patient back follow-up visit in 2 weeks before starting cycle #16.   He will continue taking stool softener for constipation.  The patient was strongly encouraged to quit smoking.  He is not interested in quitting at this time.   The patient was advised to call immediately if he has any concerning symptoms in the interval. The patient voices understanding of current disease status and treatment options and is in agreement with the current care plan. All questions were answered. The patient knows to call the clinic with any problems, questions or concerns. We can certainly see the patient much sooner if necessary   No orders of the defined types were placed in this encounter.    Dajohn Ellender L Kaylan Yates, PA-C 01/15/19  ADDENDUM: Hematology/Oncology Attending: I had a face-to-face encounter with the patient today.  I recommended his care plan.  This is a very pleasant white male with a stage IIIa non-small cell lung cancer, adenosquamous carcinoma status post induction concurrent chemoradiation with partial response and he is currently on consolidation treatment with Imfinzi status post 14 cycles.  The patient has been tolerating this treatment well with no concerning adverse effects. He had repeat CT scan of the chest performed recently.  I personally and independently reviewed the scans and discussed the results with the patient today. Has a scan showed no concerning findings for disease progression. I recommended for the patient to continue his current treatment with immunotherapy and he will proceed with cycle #15 today. The patient will come back for  follow-up visit in 2 weeks for evaluation before the next cycle of his treatment. He was advised to call immediately if he has any concerning symptoms in the interval. Disclaimer: This note was dictated with voice recognition software. Similar sounding words can inadvertently be transcribed and may be missed upon review. Eilleen Kempf, MD 01/15/19

## 2019-01-18 ENCOUNTER — Other Ambulatory Visit: Payer: Self-pay | Admitting: Internal Medicine

## 2019-01-18 DIAGNOSIS — R7989 Other specified abnormal findings of blood chemistry: Secondary | ICD-10-CM

## 2019-01-23 DIAGNOSIS — G546 Phantom limb syndrome with pain: Secondary | ICD-10-CM | POA: Diagnosis not present

## 2019-01-23 DIAGNOSIS — G894 Chronic pain syndrome: Secondary | ICD-10-CM | POA: Diagnosis not present

## 2019-01-30 ENCOUNTER — Other Ambulatory Visit: Payer: Self-pay

## 2019-01-30 ENCOUNTER — Encounter: Payer: Self-pay | Admitting: Physician Assistant

## 2019-01-30 ENCOUNTER — Inpatient Hospital Stay: Payer: Medicare Other

## 2019-01-30 ENCOUNTER — Inpatient Hospital Stay (HOSPITAL_BASED_OUTPATIENT_CLINIC_OR_DEPARTMENT_OTHER): Payer: Medicare Other | Admitting: Physician Assistant

## 2019-01-30 ENCOUNTER — Inpatient Hospital Stay: Payer: Medicare Other | Attending: Internal Medicine

## 2019-01-30 VITALS — BP 99/70 | HR 73 | Temp 97.9°F | Resp 18 | Ht 73.0 in | Wt 232.0 lb

## 2019-01-30 DIAGNOSIS — R05 Cough: Secondary | ICD-10-CM | POA: Diagnosis not present

## 2019-01-30 DIAGNOSIS — C3491 Malignant neoplasm of unspecified part of right bronchus or lung: Secondary | ICD-10-CM

## 2019-01-30 DIAGNOSIS — Z720 Tobacco use: Secondary | ICD-10-CM

## 2019-01-30 DIAGNOSIS — Z95828 Presence of other vascular implants and grafts: Secondary | ICD-10-CM

## 2019-01-30 DIAGNOSIS — E039 Hypothyroidism, unspecified: Secondary | ICD-10-CM

## 2019-01-30 DIAGNOSIS — Z5112 Encounter for antineoplastic immunotherapy: Secondary | ICD-10-CM | POA: Insufficient documentation

## 2019-01-30 DIAGNOSIS — C3411 Malignant neoplasm of upper lobe, right bronchus or lung: Secondary | ICD-10-CM

## 2019-01-30 LAB — CBC WITH DIFFERENTIAL (CANCER CENTER ONLY)
Abs Immature Granulocytes: 0.09 10*3/uL — ABNORMAL HIGH (ref 0.00–0.07)
Basophils Absolute: 0.1 10*3/uL (ref 0.0–0.1)
Basophils Relative: 1 %
Eosinophils Absolute: 0.4 10*3/uL (ref 0.0–0.5)
Eosinophils Relative: 5 %
HCT: 40.9 % (ref 39.0–52.0)
Hemoglobin: 13.3 g/dL (ref 13.0–17.0)
Immature Granulocytes: 1 %
Lymphocytes Relative: 10 %
Lymphs Abs: 0.8 10*3/uL (ref 0.7–4.0)
MCH: 32.3 pg (ref 26.0–34.0)
MCHC: 32.5 g/dL (ref 30.0–36.0)
MCV: 99.3 fL (ref 80.0–100.0)
Monocytes Absolute: 0.6 10*3/uL (ref 0.1–1.0)
Monocytes Relative: 9 %
Neutro Abs: 5.5 10*3/uL (ref 1.7–7.7)
Neutrophils Relative %: 74 %
Platelet Count: 185 10*3/uL (ref 150–400)
RBC: 4.12 MIL/uL — ABNORMAL LOW (ref 4.22–5.81)
RDW: 13.7 % (ref 11.5–15.5)
WBC Count: 7.4 10*3/uL (ref 4.0–10.5)
nRBC: 0 % (ref 0.0–0.2)

## 2019-01-30 LAB — CMP (CANCER CENTER ONLY)
ALT: 20 U/L (ref 0–44)
AST: 17 U/L (ref 15–41)
Albumin: 4 g/dL (ref 3.5–5.0)
Alkaline Phosphatase: 115 U/L (ref 38–126)
Anion gap: 8 (ref 5–15)
BUN: 11 mg/dL (ref 6–20)
CO2: 28 mmol/L (ref 22–32)
Calcium: 8.5 mg/dL — ABNORMAL LOW (ref 8.9–10.3)
Chloride: 101 mmol/L (ref 98–111)
Creatinine: 1.06 mg/dL (ref 0.61–1.24)
GFR, Est AFR Am: >60 mL/min (ref >60)
GFR, Estimated: >60 mL/min (ref >60)
Glucose, Bld: 76 mg/dL (ref 70–99)
Potassium: 4.4 mmol/L (ref 3.5–5.1)
Sodium: 137 mmol/L (ref 135–145)
Total Bilirubin: 0.3 mg/dL (ref 0.3–1.2)
Total Protein: 7.2 g/dL (ref 6.5–8.1)

## 2019-01-30 MED ORDER — SODIUM CHLORIDE 0.9 % IV SOLN
Freq: Once | INTRAVENOUS | Status: AC
  Administered 2019-01-30: 16:00:00 via INTRAVENOUS
  Filled 2019-01-30: qty 250

## 2019-01-30 MED ORDER — SODIUM CHLORIDE 0.9% FLUSH
10.0000 mL | Freq: Once | INTRAVENOUS | Status: AC
  Administered 2019-01-30: 10 mL
  Filled 2019-01-30: qty 10

## 2019-01-30 MED ORDER — SODIUM CHLORIDE 0.9 % IV SOLN
9.7000 mg/kg | Freq: Once | INTRAVENOUS | Status: AC
  Administered 2019-01-30: 1000 mg via INTRAVENOUS
  Filled 2019-01-30: qty 20

## 2019-01-30 MED ORDER — HEPARIN SOD (PORK) LOCK FLUSH 100 UNIT/ML IV SOLN
500.0000 [IU] | Freq: Once | INTRAVENOUS | Status: AC | PRN
  Administered 2019-01-30: 500 [IU]
  Filled 2019-01-30: qty 5

## 2019-01-30 MED ORDER — SODIUM CHLORIDE 0.9% FLUSH
10.0000 mL | INTRAVENOUS | Status: DC | PRN
  Administered 2019-01-30: 10 mL
  Filled 2019-01-30: qty 10

## 2019-01-30 NOTE — Progress Notes (Signed)
[place] OFFICE PROGRESS NOTE  Redmond School, MD [address] [place] [address]  DIAGNOSIS: Stage IIIA (T3, N2, M0) non-small cell lung cancer, adenosquamous carcinoma diagnosed in June 2019 and presented with large right upper lobe lung mass with questionable chest wall invasion as well as right hilar and mediastinal lymphadenopathy.  Biomarker Findings Tumor Mutational Burden - TMB-High (21 Muts/Mb) Microsatellite status - MS-Stable Genomic Findings For a complete list of the genes assayed, please refer to the Appendix. ATM D9833* CCND2 P281R KRAS G12V CHEK2 T359f*15 TP53 E298* 7 Disease relevant genes with no reportable alterations: ALK, EGFR, BRAF, MET, RET, ERBB2, ROS1  PRIOR THERAPY: Concurrent chemoradiation with chemotherapy consisting of weekly carboplatin for an AUC of 2 and paclitaxel 45 mg/m2. First dose given on 04/03/2018.Status post 6 cycles. Last dose was giving 05/15/2018 with partial response.  CURRENT THERAPY: Consolidation treatment with immunotherapy with Imfinzi (Durvalumab) 10 mg/KG every 2 weeks. First dose 06/20/2018. Status post 15cycles.  INTERVAL HISTORY: Mark [id number].[id number]. male returns to the clinic for a follow-up visit.  The patient is feeling well today without any concerning complaints.  He denies any fever, chills, night sweats, or weight loss.  He denies any chest pain, shortness of breath, or hemoptysis.  He has a baseline cough. He continues to smoke approximately a 1/2 a pack a day of cigarettes. He denies any nausea, vomiting, or diarrhea. He occasionally has constipation for which he takes stool softener and it works well for him. He denies any rashes or skin changes. He denies any headaches or visual changes. He is here today for evaluation prior to starting cycle #6.  MEDICAL HISTORY: Past Medical History:  Diagnosis Date  . Acute hepatitis C virus infection 06/19/2007   Qualifier: Diagnosis of  By: MLeilani Merl CMA, Tiffany    . Acute ST elevation myocardial infarction (STEMI) involving left anterior descending (LAD) coronary artery ([facility] 05/26/2016  . Acute ST elevation myocardial infarction (STEMI) of lateral wall ([facility] 05/26/2016  . 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 Anxiety   . Asthma   . Atherosclerosis of native arteries of the extremities with intermittent claudication 12/16/2011  . Chronic hepatitis C without hepatic coma ([facility] 05/03/2014  . COPD with chronic bronchitis and emphysema ([facility] 03/13/2018   FEV1 57%  . Depression   . DVT (deep venous thrombosis) ([facility] 2009; 2019   RLE; LLE  . Encounter for antineoplastic chemotherapy 03/22/2018  . Hepatitis C    "tx'd in 2015"  . High cholesterol   . Ischemic cardiomyopathy 03/02/2018  . Non-small cell carcinoma of right lung, stage 3 ([facility] 03/22/2018  . PAD (peripheral artery disease) ([facility] 01/25/2013  . Peripheral vascular disease, unspecified 07/20/2012  . Port-A-Cath in place 04/24/2018  . ST elevation myocardial infarction involving left anterior descending (LAD) coronary artery ([facility]   . STEMI (ST elevation myocardial infarction) ([facility] 05/26/2016  . Tobacco abuse 01/28/2016  . Tubular adenoma of colon 07/11/2014  . Urinary dribbling     ALLERGIES:  is allergic to lyrica [pregabalin] and neurontin [gabapentin].  MEDICATIONS:  Current Outpatient Medications  Medication Sig Dispense Refill  . alprazolam (XANAX) 2 MG tablet Take 2 mg by mouth 4 (four) times daily as needed for anxiety.     .Marland Kitchenamitriptyline (ELAVIL) 100 MG tablet Take 100 mg by mouth at bedtime.     .Marland Kitchenaspirin 81 MG EC tablet Take 81 mg by mouth daily.      .Marland Kitchenatorvastatin (LIPITOR) 80  MG tablet Take 1 tablet (80 mg total) by mouth daily at 6 PM. 30 tablet 12  . carvedilol (COREG) 6.25 MG tablet TAKE 1 TABLET BY MOUTH TWICE A DAY WITH A MEAL 60 tablet 8  . citalopram (CELEXA) 40 MG tablet Take 40 mg by mouth daily.  11  . HYDROcodone-acetaminophen  (NORCO) 10-325 MG tablet Take 1 tablet by mouth every 4 (four) hours as needed for moderate pain.   0  . levETIRAcetam (KEPPRA) 750 MG tablet Take 1 tablet (750 mg total) by mouth 2 (two) times daily. 60 tablet 5  . levothyroxine (SYNTHROID) 125 MCG tablet TAKE 1 TABLET BY MOUTH EVERY DAY BEFORE BREAKFAST 30 tablet 0  . lidocaine-prilocaine (EMLA) cream Apply 1 application topically as needed. 30 g 0  . lisinopril (PRINIVIL,ZESTRIL) 2.5 MG tablet Take 1 tablet (2.5 mg total) by mouth daily. 30 tablet 12  . nitroGLYCERIN (NITROSTAT) 0.4 MG SL tablet Place 1 tablet (0.4 mg total) under the tongue every 5 (five) minutes x 3 doses as needed for chest pain. 25 tablet 2  . prochlorperazine (COMPAZINE) 10 MG tablet Take 1 tablet (10 mg total) by mouth every 6 (six) hours as needed for nausea or vomiting. 30 tablet 0  . spironolactone (ALDACTONE) 25 MG tablet TAKE 1 TABLET BY MOUTH EVERY DAY 90 tablet 3  . sucralfate (CARAFATE) 1 g tablet Take 1 tablet (1 g total) by mouth 4 (four) times daily -  with meals and at bedtime. 5 min before meals for radiation induced esophagitis 120 tablet 2  . tamsulosin (FLOMAX) 0.4 MG CAPS capsule Take 1 capsule by mouth daily.    . Tiotropium Bromide-Olodaterol (STIOLTO RESPIMAT) 2.5-2.5 MCG/ACT AERS Inhale 2 puffs into the lungs daily. 1 Inhaler 5  . umeclidinium-vilanterol (ANORO ELLIPTA) 62.5-25 MCG/INH AEPB Inhale 1 puff into the lungs daily. 1 each 0  . furosemide (LASIX) 20 MG tablet Take 1 tablet (20 mg total) by mouth daily. 90 tablet 3   No current facility-administered medications for this visit.     SURGICAL HISTORY:  Past Surgical History:  Procedure Laterality Date  . AORTOGRAM  08/08/2009   for LLE claudication     By Dr. Oneida Alar  . 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 BIOPSY N/A 05/30/2014   Procedure: BIOPSY;  Surgeon: [name], MD;  Location: [facility];  Service: Endoscopy;  Laterality: N/A;  . BLADDER TUMOR EXCISION  8/812  . CARDIAC  CATHETERIZATION N/A 05/26/2016   Procedure: Left Heart Cath and Coronary Angiography;  Surgeon: [name], MD;  Location: [facility];  Service: Cardiovascular;  Laterality: N/A;  . CARDIAC CATHETERIZATION N/A 05/26/2016   Procedure: Coronary Stent Intervention;  Surgeon: [name], MD;  Location: [facility];  Service: Cardiovascular;  Laterality: N/A;  . COLONOSCOPY WITH PROPOFOL N/A 05/30/2014   ZTI:WPYKDX colonic polyp-likely source of hematochezia-removed as described above  . ESOPHAGOGASTRODUODENOSCOPY (EGD) WITH PROPOFOL N/A 05/30/2014   IPJ:ASNKNL and bulbar erosions s/p gastric biopsy. No evidence of portal gastropathy on today's examination.  . FEMORAL-TIBIAL BYPASS GRAFT  2009   Right side using non-reversed GSV   By Dr. Oneida Alar  . FEMORAL-TIBIAL BYPASS GRAFT  11/24/2007   Right femoral to anterior tibial BPG   by Dr. Oneida Alar  . FINGER SURGERY Left    "straightened my pinky"  . HAND TENDON SURGERY Left 2013   Left 5th finger  . HERNIA REPAIR     "stomach"  . ICD IMPLANT  N/A 03/02/2018   Procedure: ICD IMPLANT;  Surgeon: [name], MD;  Location: [facility];  Service: Cardiovascular;  Laterality: N/A;  . INCISIONAL HERNIA REPAIR N/A 12/25/2014   Procedure: HERNIA REPAIR INCISIONAL WITH MESH;  Surgeon: [name], MD;  Location: [facility];  Service: General;  Laterality: N/A;  . INSERTION OF MESH N/A 12/25/2014   Procedure: INSERTION OF MESH;  Surgeon: [name], MD;  Location: [facility];  Service: General;  Laterality: N/A;  . IR IMAGING GUIDED PORT INSERTION  04/11/2018  . LAPAROSCOPIC CHOLECYSTECTOMY    . LOWER EXTREMITY ANGIOGRAM Left 12/30/2015   Procedure: Lower Extremity Angiogram;  Surgeon: [name], MD;  Location: [facility];  Service: Cardiovascular;  Laterality: Left;  . PERIPHERAL VASCULAR CATHETERIZATION N/A 12/30/2015   Procedure: Abdominal Aortogram;  Surgeon: [name], MD;  Location: [facility];   Service: Cardiovascular;  Laterality: N/A;  . POLYPECTOMY  05/30/2014   Procedure: POLYPECTOMY;  Surgeon: [name], MD;  Location: [facility];  Service: Endoscopy;;  . TONSILLECTOMY AND ADENOIDECTOMY  ~ 1970  . TYMPANOSTOMY TUBE PLACEMENT Bilateral ~ 1970    REVIEW OF SYSTEMS:   Review of Systems  Constitutional: Negative for appetite change, chills, fatigue, fever and unexpected weight change.  HENT:   Negative for mouth sores, nosebleeds, sore throat and trouble swallowing.   Eyes: Negative for eye problems and icterus.  Respiratory: Positive for baseline cough. Negative for hemoptysis, shortness of breath and wheezing.   Cardiovascular: Negative for chest pain and leg swelling.  Gastrointestinal: Negative for abdominal pain, constipation, diarrhea, nausea and vomiting.  Genitourinary: Negative for bladder incontinence, difficulty urinating, dysuria, frequency and hematuria.   Musculoskeletal: Negative for back pain, gait problem, neck pain and neck stiffness.  Skin: Negative for itching and rash.  Neurological: Negative for dizziness, extremity weakness, gait problem, headaches, light-headedness and seizures.  Hematological: Negative for adenopathy. Does not bruise/bleed easily.  Psychiatric/Behavioral: Negative for confusion, depression and sleep disturbance. The patient is not nervous/anxious.     PHYSICAL EXAMINATION:  Blood pressure 99/70, pulse 73, temperature 97.9 F (36.6 C), temperature source Oral, resp. rate 18, height _0  (1.854 m), weight 232 lb (105.2 kg), SpO2 95 %.  ECOG PERFORMANCE STATUS: 1 - Symptomatic but completely ambulatory  Physical Exam  Constitutional: Oriented to person, place, and time and well-developed, well-nourished, and in no distress.  HENT:  Head: Normocephalic and atraumatic.  Mouth/Throat: Oropharynx is clear and moist. No oropharyngeal exudate.  Eyes: Conjunctivae are normal. Right eye exhibits no discharge. Left eye exhibits no discharge.  No scleral icterus.  Neck: Normal range of motion. Neck supple.  Cardiovascular: Normal rate, regular rhythm, normal heart sounds and intact distal pulses.   Pulmonary/Chest: Effort normal and breath sounds normal. No respiratory distress. No wheezes. No rales.  Abdominal: Soft. Bowel sounds are normal. Exhibits no distension and no mass. There is no tenderness.  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 Normal range of motion. Exhibits no edema.  Lymphadenopathy:    No cervical adenopathy.  Neurological: Alert and oriented to person, place, and time. Exhibits normal muscle tone. Gait normal. Coordination normal.  Skin: Skin is warm and dry. No rash noted. Not diaphoretic. No erythema. No pallor.  Psychiatric: Mood, memory and judgment normal.  Vitals reviewed.  LABORATORY DATA: Lab Results  Component Value Date   WBC 6.9 01/15/2019   HGB 13.4 01/15/2019   HCT 41.4 01/15/2019   MCV 97.6  01/15/2019   PLT 194 01/15/2019      Chemistry      Component Value Date/Time   NA 135 01/15/2019 1217   NA 139 02/27/2018 1457   K 4.4 01/15/2019 1217   CL 96 (L) 01/15/2019 1217   CO2 29 01/15/2019 1217   BUN 11 01/15/2019 1217   BUN 7 02/27/2018 1457   CREATININE 1.17 01/15/2019 1217   CREATININE 1.13 06/28/2016 1453      Component Value Date/Time   CALCIUM 9.0 01/15/2019 1217   ALKPHOS 141 (H) 01/15/2019 1217   AST 25 01/15/2019 1217   ALT 36 01/15/2019 1217   BILITOT 0.4 01/15/2019 1217       RADIOGRAPHIC STUDIES:  Ct Chest W Contrast  Result Date: 01/12/2019 CLINICAL DATA:  58 year old male with right upper lobe non-small cell carcinoma EXAM: CT CHEST WITH CONTRAST TECHNIQUE: Multidetector CT imaging of the chest was performed during intravenous contrast administration. CONTRAST:  69m ISOVUE-300 IOPAMIDOL (ISOVUE-300) INJECTION 61% COMPARISON:  09/18/2018, 06/09/2018, 03/21/2018, PET-CT 03/10/2018 FINDINGS: Cardiovascular: Heart size within normal limits. Dense  calcifications of the left anterior descending coronary artery. Pacing device on the left chest wall with lead terminating in the right atrium and apex of the right ventricle. Unremarkable course caliber and contour of the thoracic aorta. Calcifications of the abdominal aorta. Mediastinum/Nodes: Similar appearance of small mediastinal lymph nodes, with index node in the subcarinal nodal station measuring 8 mm, essentially unchanged from the prior. No new or enlarging mediastinal lymph nodes. Unremarkable thoracic inlet. Right IJ port catheter terminates at the superior vena cava. Unremarkable thoracic esophagus. Lungs/Pleura: Redemonstration of treatment effects of known right upper lobe mass. Greatest diameter on the previous measures 5.2 cm. Greatest diameter on the current measures 5.1 cm, essentially unchanged. There is persistent broad-based attachment to the pleura and no evidence of cavitation. There are multiple subcentimeter ill-defined nodules of the right upper lobe, right middle lobe, and right lower lobe. There are a few left-sided similar subcentimeter ill-defined nodules, though certainly not as many as on the right. Upper Abdomen: No acute finding of the upper abdomen. Musculoskeletal: No acute displaced fracture. No aggressive bony lesions. IMPRESSION: Redemonstration of treatment effects of known right upper lobe non-small cell carcinoma, with unchanged size/configuration from the prior. There has been further development of multiple subcentimeter ill-defined nodules, which are predominantly right-sided involving right upper lobe, right middle lobe, right lower lobe. The distribution is predominantly centrilobular. While inflammatory/infectious process could have this appearance, it is conspicuously right-sided, which would suggest postobstructive process. Alternatively, developing metastatic disease could have this appearance, and short-term follow-up CT imaging is recommended. Coronary artery  disease and aortic atherosclerosis. Aortic Atherosclerosis (ICD10-I70.0). Electronically Signed   By: JCorrie MckusickD.O.   On: 01/12/2019 16:35     ASSESSMENT/PLAN:  This is a very pleasant 58year old Caucasian male with stage IIIa non-small cell lung cancer, adenosquamous carcinoma.  He presented with a large right upper lobe lung mass with questionable chest wall invasion as well as right hilar and mediastinal lymphadenopathy.  He was diagnosed in June 2019. He completed 6 cycles of concurrent chemoradiation with carboplatin and paclitaxel.  He had a partial response to treatment. The patient is currently undergoing consolidation immunotherapy with Imfinzi 10 mg/kg IV every 2 weeks.  He is status post 15 cycles.  He continues to tolerate treatment well without any adverse effects.  The patient was seen with Dr. MJulien Nordmanntoday.  I recommend that he proceed with cycle #16 today as scheduled.  I will see him back for a follow-up visit in 2 weeks for evaluation prior to starting cycle #17.  The patient's dose of his Synthroid was adjusted on December 15, 2018 to 125 mcg from 88 mcg.  The patient accidentally had started taking his 88 mcg prescritiption again which was discovered at his last visit upon lab review. He has since been taking the correct dose to his knowledge. We will continue to monitor his TSH on routine labs and will recheck it at his next lab appointment.   The patient will continue taking his stool softener as needed for constipation. The patient strongly encouraged to quit smoking. He is not interested in quitting at this time.  The patient was advised to call immediately if he has any concerning symptoms in the interval. The patient voices understanding of current disease status and treatment options and is in agreement with the current care plan. All questions were answered. The patient knows to call the clinic with any problems, questions or concerns. We can certainly see the  patient much sooner if necessary  No orders of the defined types were placed in this encounter.    Tyrek Lawhorn L Hosam Mcfetridge, PA-C 01/30/19  ADDENDUM: Hematology/Oncology Attending: I had a face-to-face encounter with the patient today.  I recommended his care plan.  This is a very pleasant 58 years old white male with a stage IIIa non-small cell lung cancer, adenocarcinoma status post a course of concurrent chemoradiation with weekly carboplatin and paclitaxel and he is currently undergoing consolidation treatment with Imfinzi status post 15 cycles.  The patient has been tolerating this treatment well with no concerning adverse effects. I recommended for him to proceed with cycle #16 today as scheduled. The patient will come back for follow-up visit in 2 weeks for evaluation before the next cycle of his treatment. For hypothyroidism he will continue with the current dose of levothyroxine. He was advised to call immediately if he has any concerning symptoms in the interval.  Disclaimer: This note was dictated with voice recognition software. Similar sounding words can inadvertently be transcribed and may be missed upon review. Eilleen Kempf, MD 01/30/19

## 2019-01-30 NOTE — Patient Instructions (Signed)
Cancer Center Discharge Instructions for Patients Receiving Chemotherapy  Today you received the following chemotherapy agents: Durvalumab (Imfinzi)   To help prevent nausea and vomiting after your treatment, we encourage you to take your nausea medication as directed.   If you develop nausea and vomiting that is not controlled by your nausea medication, call the clinic.   BELOW ARE SYMPTOMS THAT SHOULD BE REPORTED IMMEDIATELY:  *FEVER GREATER THAN 100.5 F  *CHILLS WITH OR WITHOUT FEVER  NAUSEA AND VOMITING THAT IS NOT CONTROLLED WITH YOUR NAUSEA MEDICATION  *UNUSUAL SHORTNESS OF BREATH  *UNUSUAL BRUISING OR BLEEDING  TENDERNESS IN MOUTH AND THROAT WITH OR WITHOUT PRESENCE OF ULCERS  *URINARY PROBLEMS  *BOWEL PROBLEMS  UNUSUAL RASH Items with * indicate a potential emergency and should be followed up as soon as possible.  Feel free to call the clinic should you have any questions or concerns. The clinic phone number is [phone].  Please show the CHEMO ALERT CARD at check-in to the Emergency Department and triage nurse.   

## 2019-02-13 ENCOUNTER — Inpatient Hospital Stay: Payer: Medicare Other

## 2019-02-13 ENCOUNTER — Other Ambulatory Visit: Payer: Self-pay

## 2019-02-13 ENCOUNTER — Inpatient Hospital Stay (HOSPITAL_BASED_OUTPATIENT_CLINIC_OR_DEPARTMENT_OTHER): Payer: Medicare Other | Admitting: Physician Assistant

## 2019-02-13 ENCOUNTER — Encounter: Payer: Self-pay | Admitting: Physician Assistant

## 2019-02-13 VITALS — BP 99/68 | HR 76 | Temp 98.3°F | Resp 18 | Ht 73.0 in | Wt 231.7 lb

## 2019-02-13 DIAGNOSIS — E039 Hypothyroidism, unspecified: Secondary | ICD-10-CM

## 2019-02-13 DIAGNOSIS — Z5112 Encounter for antineoplastic immunotherapy: Secondary | ICD-10-CM | POA: Diagnosis not present

## 2019-02-13 DIAGNOSIS — C3491 Malignant neoplasm of unspecified part of right bronchus or lung: Secondary | ICD-10-CM

## 2019-02-13 DIAGNOSIS — C3411 Malignant neoplasm of upper lobe, right bronchus or lung: Secondary | ICD-10-CM | POA: Diagnosis not present

## 2019-02-13 DIAGNOSIS — Z720 Tobacco use: Secondary | ICD-10-CM

## 2019-02-13 DIAGNOSIS — Z95828 Presence of other vascular implants and grafts: Secondary | ICD-10-CM

## 2019-02-13 DIAGNOSIS — J449 Chronic obstructive pulmonary disease, unspecified: Secondary | ICD-10-CM | POA: Diagnosis not present

## 2019-02-13 LAB — CMP (CANCER CENTER ONLY)
ALT: 20 U/L (ref 0–44)
AST: 16 U/L (ref 15–41)
Albumin: 4 g/dL (ref 3.5–5.0)
Alkaline Phosphatase: 117 U/L (ref 38–126)
Anion gap: 7 (ref 5–15)
BUN: 12 mg/dL (ref 6–20)
CO2: 33 mmol/L — ABNORMAL HIGH (ref 22–32)
Calcium: 9.1 mg/dL (ref 8.9–10.3)
Chloride: 98 mmol/L (ref 98–111)
Creatinine: 1.05 mg/dL (ref 0.61–1.24)
GFR, Est AFR Am: >60 mL/min (ref >60)
GFR, Estimated: >60 mL/min (ref >60)
Glucose, Bld: 83 mg/dL (ref 70–99)
Potassium: 4.2 mmol/L (ref 3.5–5.1)
Sodium: 138 mmol/L (ref 135–145)
Total Bilirubin: 0.2 mg/dL — ABNORMAL LOW (ref 0.3–1.2)
Total Protein: 7.3 g/dL (ref 6.5–8.1)

## 2019-02-13 LAB — CBC WITH DIFFERENTIAL (CANCER CENTER ONLY)
Abs Immature Granulocytes: 0.06 10*3/uL (ref 0.00–0.07)
Basophils Absolute: 0.1 10*3/uL (ref 0.0–0.1)
Basophils Relative: 1 %
Eosinophils Absolute: 0.3 10*3/uL (ref 0.0–0.5)
Eosinophils Relative: 4 %
HCT: 42.2 % (ref 39.0–52.0)
Hemoglobin: 13.5 g/dL (ref 13.0–17.0)
Immature Granulocytes: 1 %
Lymphocytes Relative: 11 %
Lymphs Abs: 0.8 10*3/uL (ref 0.7–4.0)
MCH: 31.6 pg (ref 26.0–34.0)
MCHC: 32 g/dL (ref 30.0–36.0)
MCV: 98.8 fL (ref 80.0–100.0)
Monocytes Absolute: 0.6 10*3/uL (ref 0.1–1.0)
Monocytes Relative: 8 %
Neutro Abs: 5.2 10*3/uL (ref 1.7–7.7)
Neutrophils Relative %: 75 %
Platelet Count: 168 10*3/uL (ref 150–400)
RBC: 4.27 MIL/uL (ref 4.22–5.81)
RDW: 13.3 % (ref 11.5–15.5)
WBC Count: 6.9 10*3/uL (ref 4.0–10.5)
nRBC: 0 % (ref 0.0–0.2)

## 2019-02-13 LAB — TSH: TSH: 15.516 u[IU]/mL — ABNORMAL HIGH (ref 0.320–4.118)

## 2019-02-13 MED ORDER — HEPARIN SOD (PORK) LOCK FLUSH 100 UNIT/ML IV SOLN
500.0000 [IU] | Freq: Once | INTRAVENOUS | Status: AC | PRN
  Administered 2019-02-13: 500 [IU]
  Filled 2019-02-13: qty 5

## 2019-02-13 MED ORDER — SODIUM CHLORIDE 0.9% FLUSH
10.0000 mL | Freq: Once | INTRAVENOUS | Status: AC
  Administered 2019-02-13: 10 mL
  Filled 2019-02-13: qty 10

## 2019-02-13 MED ORDER — SODIUM CHLORIDE 0.9 % IV SOLN
1000.0000 mg | Freq: Once | INTRAVENOUS | Status: AC
  Administered 2019-02-13: 1000 mg via INTRAVENOUS
  Filled 2019-02-13: qty 20

## 2019-02-13 MED ORDER — SODIUM CHLORIDE 0.9% FLUSH
10.0000 mL | INTRAVENOUS | Status: DC | PRN
  Administered 2019-02-13: 10 mL
  Filled 2019-02-13: qty 10

## 2019-02-13 MED ORDER — SODIUM CHLORIDE 0.9 % IV SOLN
Freq: Once | INTRAVENOUS | Status: AC
  Administered 2019-02-13: 14:00:00 via INTRAVENOUS
  Filled 2019-02-13: qty 250
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
## 2019-02-13 NOTE — Patient Instructions (Signed)

## 2019-02-13 NOTE — Patient Instructions (Signed)
[facility] Discharge Instructions for Patients Receiving Chemotherapy  Today you received the following chemotherapy agents: Durvalumab (Imfinzi)   To help prevent nausea and vomiting after your treatment, we encourage you to take your nausea medication as directed.   If you develop nausea and vomiting that is not controlled by your nausea medication, call the clinic.   BELOW ARE SYMPTOMS THAT SHOULD BE REPORTED IMMEDIATELY:  *FEVER GREATER THAN 100.5 F  *CHILLS WITH OR WITHOUT FEVER  NAUSEA AND VOMITING THAT IS NOT CONTROLLED WITH YOUR NAUSEA MEDICATION  *UNUSUAL SHORTNESS OF BREATH  *UNUSUAL BRUISING OR BLEEDING  TENDERNESS IN MOUTH AND THROAT WITH OR WITHOUT PRESENCE OF ULCERS  *URINARY PROBLEMS  *BOWEL PROBLEMS  UNUSUAL RASH Items with * indicate a potential emergency and should be followed up as soon as possible.  Feel free to call the clinic should you have any questions or concerns. The clinic phone number is [phone].  Please show the CHEMO ALERT CARD at check-in to the Emergency Department and triage nurse.   

## 2019-02-14 ENCOUNTER — Telehealth: Payer: Self-pay | Admitting: Physician Assistant

## 2019-02-14 NOTE — Telephone Encounter (Signed)
Per 5/19 los - appts already scheduled to the end of treatment plan - no other appts added

## 2019-02-20 DIAGNOSIS — Z89511 Acquired absence of right leg below knee: Secondary | ICD-10-CM | POA: Diagnosis not present

## 2019-02-20 DIAGNOSIS — G894 Chronic pain syndrome: Secondary | ICD-10-CM | POA: Diagnosis not present

## 2019-02-20 DIAGNOSIS — G546 Phantom limb syndrome with pain: Secondary | ICD-10-CM | POA: Diagnosis not present

## 2019-02-26 ENCOUNTER — Other Ambulatory Visit: Payer: Self-pay

## 2019-02-26 DIAGNOSIS — I739 Peripheral vascular disease, unspecified: Secondary | ICD-10-CM

## 2019-02-27 ENCOUNTER — Inpatient Hospital Stay: Payer: Medicare Other

## 2019-02-27 ENCOUNTER — Inpatient Hospital Stay: Payer: Medicare Other | Attending: Internal Medicine

## 2019-02-27 ENCOUNTER — Other Ambulatory Visit: Payer: Self-pay

## 2019-02-27 ENCOUNTER — Encounter: Payer: Self-pay | Admitting: Physician Assistant

## 2019-02-27 ENCOUNTER — Inpatient Hospital Stay (HOSPITAL_BASED_OUTPATIENT_CLINIC_OR_DEPARTMENT_OTHER): Payer: Medicare Other | Admitting: Physician Assistant

## 2019-02-27 VITALS — BP 120/71 | HR 70 | Temp 98.4°F | Resp 18 | Ht 73.0 in | Wt 230.1 lb

## 2019-02-27 DIAGNOSIS — S40812A Abrasion of left upper arm, initial encounter: Secondary | ICD-10-CM

## 2019-02-27 DIAGNOSIS — C3411 Malignant neoplasm of upper lobe, right bronchus or lung: Secondary | ICD-10-CM

## 2019-02-27 DIAGNOSIS — C3491 Malignant neoplasm of unspecified part of right bronchus or lung: Secondary | ICD-10-CM

## 2019-02-27 DIAGNOSIS — Z5112 Encounter for antineoplastic immunotherapy: Secondary | ICD-10-CM | POA: Diagnosis not present

## 2019-02-27 DIAGNOSIS — Z95828 Presence of other vascular implants and grafts: Secondary | ICD-10-CM

## 2019-02-27 DIAGNOSIS — Z79899 Other long term (current) drug therapy: Secondary | ICD-10-CM | POA: Insufficient documentation

## 2019-02-27 DIAGNOSIS — R05 Cough: Secondary | ICD-10-CM

## 2019-02-27 DIAGNOSIS — K5900 Constipation, unspecified: Secondary | ICD-10-CM | POA: Diagnosis not present

## 2019-02-27 DIAGNOSIS — Z720 Tobacco use: Secondary | ICD-10-CM

## 2019-02-27 LAB — CMP (CANCER CENTER ONLY)
ALT: 53 U/L — ABNORMAL HIGH (ref 0–44)
AST: 37 U/L (ref 15–41)
Albumin: 4 g/dL (ref 3.5–5.0)
Alkaline Phosphatase: 121 U/L (ref 38–126)
Anion gap: 7 (ref 5–15)
BUN: 12 mg/dL (ref 6–20)
CO2: 29 mmol/L (ref 22–32)
Calcium: 8.8 mg/dL — ABNORMAL LOW (ref 8.9–10.3)
Chloride: 101 mmol/L (ref 98–111)
Creatinine: 1.06 mg/dL (ref 0.61–1.24)
GFR, Est AFR Am: >60 mL/min (ref >60)
GFR, Estimated: >60 mL/min (ref >60)
Glucose, Bld: 97 mg/dL (ref 70–99)
Potassium: 4.1 mmol/L (ref 3.5–5.1)
Sodium: 137 mmol/L (ref 135–145)
Total Bilirubin: 0.3 mg/dL (ref 0.3–1.2)
Total Protein: 7.2 g/dL (ref 6.5–8.1)

## 2019-02-27 LAB — CBC WITH DIFFERENTIAL (CANCER CENTER ONLY)
Abs Immature Granulocytes: 0.07 10*3/uL (ref 0.00–0.07)
Basophils Absolute: 0.1 10*3/uL (ref 0.0–0.1)
Basophils Relative: 1 %
Eosinophils Absolute: 0.3 10*3/uL (ref 0.0–0.5)
Eosinophils Relative: 4 %
HCT: 41.6 % (ref 39.0–52.0)
Hemoglobin: 13.5 g/dL (ref 13.0–17.0)
Immature Granulocytes: 1 %
Lymphocytes Relative: 11 %
Lymphs Abs: 0.8 10*3/uL (ref 0.7–4.0)
MCH: 31.4 pg (ref 26.0–34.0)
MCHC: 32.5 g/dL (ref 30.0–36.0)
MCV: 96.7 fL (ref 80.0–100.0)
Monocytes Absolute: 0.7 10*3/uL (ref 0.1–1.0)
Monocytes Relative: 10 %
Neutro Abs: 5.8 10*3/uL (ref 1.7–7.7)
Neutrophils Relative %: 73 %
Platelet Count: 194 10*3/uL (ref 150–400)
RBC: 4.3 MIL/uL (ref 4.22–5.81)
RDW: 13 % (ref 11.5–15.5)
WBC Count: 7.8 10*3/uL (ref 4.0–10.5)
nRBC: 0 % (ref 0.0–0.2)

## 2019-02-27 MED ORDER — HEPARIN SOD (PORK) LOCK FLUSH 100 UNIT/ML IV SOLN
500.0000 [IU] | Freq: Once | INTRAVENOUS | Status: DC
  Filled 2019-02-27: qty 5

## 2019-02-27 MED ORDER — HEPARIN SOD (PORK) LOCK FLUSH 100 UNIT/ML IV SOLN
500.0000 [IU] | Freq: Once | INTRAVENOUS | Status: AC | PRN
  Administered 2019-02-27: 500 [IU]
  Filled 2019-02-27: qty 5

## 2019-02-27 MED ORDER — SODIUM CHLORIDE 0.9% FLUSH
10.0000 mL | INTRAVENOUS | Status: DC | PRN
  Administered 2019-02-27: 10 mL
  Filled 2019-02-27: qty 10

## 2019-02-27 MED ORDER — SODIUM CHLORIDE 0.9% FLUSH
10.0000 mL | Freq: Once | INTRAVENOUS | Status: AC
  Administered 2019-02-27: 10 mL
  Filled 2019-02-27: qty 10

## 2019-02-27 MED ORDER — SODIUM CHLORIDE 0.9 % IV SOLN
Freq: Once | INTRAVENOUS | Status: AC
  Administered 2019-02-27: 14:00:00 via INTRAVENOUS
  Filled 2019-02-27: qty 250

## 2019-02-27 MED ORDER — SODIUM CHLORIDE 0.9 % IV SOLN
9.6000 mg/kg | Freq: Once | INTRAVENOUS | Status: AC
  Administered 2019-02-27: 1000 mg via INTRAVENOUS
  Filled 2019-02-27: qty 20

## 2019-02-27 NOTE — Patient Instructions (Signed)
[facility] Discharge Instructions for Patients Receiving Chemotherapy  Today you received the following chemotherapy agents: Durvalumab (Imfinzi)   To help prevent nausea and vomiting after your treatment, we encourage you to take your nausea medication as directed.   If you develop nausea and vomiting that is not controlled by your nausea medication, call the clinic.   BELOW ARE SYMPTOMS THAT SHOULD BE REPORTED IMMEDIATELY:  *FEVER GREATER THAN 100.5 F  *CHILLS WITH OR WITHOUT FEVER  NAUSEA AND VOMITING THAT IS NOT CONTROLLED WITH YOUR NAUSEA MEDICATION  *UNUSUAL SHORTNESS OF BREATH  *UNUSUAL BRUISING OR BLEEDING  TENDERNESS IN MOUTH AND THROAT WITH OR WITHOUT PRESENCE OF ULCERS  *URINARY PROBLEMS  *BOWEL PROBLEMS  UNUSUAL RASH Items with * indicate a potential emergency and should be followed up as soon as possible.  Feel free to call the clinic should you have any questions or concerns. The clinic phone number is [phone].  Please show the CHEMO ALERT CARD at check-in to the Emergency Department and triage nurse.   

## 2019-02-27 NOTE — Progress Notes (Signed)
[name]  Redmond School, MD [address] [place] [address]  DIAGNOSIS: Stage IIIA (T3, N2, M0) non-small cell lung cancer, adenosquamous carcinoma diagnosed in June 2019 and presented with large right upper lobe lung mass with questionable chest wall invasion as well as right hilar and mediastinal lymphadenopathy.  Biomarker Findings Tumor Mutational Burden - TMB-High (21 Muts/Mb) Microsatellite status - MS-Stable Genomic Findings For a complete list of the genes assayed, please refer to the Appendix. ATM Y6503* CCND2 P281R KRAS G12V CHEK2 T315f*15 TP53 E298* 7 Disease relevant genes with no reportable alterations: ALK, EGFR, BRAF, MET, RET, ERBB2, ROS1  PRIOR THERAPY: Concurrent chemoradiation with chemotherapy consisting of weekly carboplatin for an AUC of 2 and paclitaxel 45 mg/m2. First dose given on 04/03/2018.Status post 6 cycles. Last dose was giving 05/15/2018 with partial response.  CURRENT THERAPY: Consolidation treatment with immunotherapy with Imfinzi (Durvalumab) 10 mg/KG every 2 weeks. First dose 06/20/2018. Status post 17cycles  INTERVAL HISTORY: [name] [name]58y.o. male returns to the clinic for a follow-up visit.  The patient is feeling fairly well today without any concerning complaints except for an abrasion on his left arm due to tripping on the curve yesterday. He did not seek any medical attention. Him and his wife cleaned off the wound and have been wrapping it in a clean dressing. He denies any fevers, mucopurulent drainage, or numbness/tingling/or weakness in his hands/fingers.   The patient tolerated his last treatment well without any adverse effects.  He denies any fever, chills, night sweats, or weight loss.  He denies any chest pain, shortness of breath, or hemoptysis.  He has a baseline cough.  Unfortunately the patient continues to smoke approximately 1 pack of cigarettes a day. The patient denies any  nausea, vomiting, or diarrhea.  He occasionally gets constipation for which he takes a stool softener and it has been working well for him. He is unable to recall which stool softener he is taking.  He denies any rashes or skin changes besides the abrasions on his arm.  He denies any headache or visual changes.  He is here today for evaluation before starting cycle #18 of his immunotherapy.   MEDICAL HISTORY: Past Medical History:  Diagnosis Date  . Acute hepatitis C virus infection 06/19/2007   Qualifier: Diagnosis of  By: MLeilani MerlCMA, Tiffany    . Acute ST elevation myocardial infarction (STEMI) involving left anterior descending (LAD) coronary artery ([facility] 05/26/2016  . Acute ST elevation myocardial infarction (STEMI) of lateral wall ([facility] 05/26/2016  . 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 Anxiety   . Asthma   . Atherosclerosis of native arteries of the extremities with intermittent claudication 12/16/2011  . Chronic hepatitis C without hepatic coma ([facility] 05/03/2014  . COPD with chronic bronchitis and emphysema ([facility] 03/13/2018   FEV1 57%  . Depression   . DVT (deep venous thrombosis) ([facility] 2009; 2019   RLE; LLE  . Encounter for antineoplastic chemotherapy 03/22/2018  . Hepatitis C    "tx'd in 2015"  . High cholesterol   . Ischemic cardiomyopathy 03/02/2018  . Non-small cell carcinoma of right lung, stage 3 ([facility] 03/22/2018  . PAD (peripheral artery disease) ([facility] 01/25/2013  . Peripheral vascular disease, unspecified 07/20/2012  . Port-A-Cath in place 04/24/2018  . ST elevation myocardial infarction involving left anterior descending (LAD) coronary artery ([facility]   . STEMI (ST elevation myocardial infarction) ([facility] 05/26/2016  . Tobacco abuse 01/28/2016  . Tubular adenoma  of colon 07/11/2014  . Urinary dribbling     ALLERGIES:  is allergic to lyrica [pregabalin] and neurontin [gabapentin].  MEDICATIONS:  Current Outpatient Medications  Medication Sig Dispense Refill   . alprazolam (XANAX) 2 MG tablet Take 2 mg by mouth 4 (four) times daily as needed for anxiety.     [name] amitriptyline (ELAVIL) 100 MG tablet Take 100 mg by mouth at bedtime.     [name] aspirin 81 MG EC tablet Take 81 mg by mouth daily.      [name] atorvastatin (LIPITOR) 80 MG tablet Take 1 tablet (80 mg total) by mouth daily at 6 PM. 30 tablet 12  . carvedilol (COREG) 6.25 MG tablet TAKE 1 TABLET BY MOUTH TWICE A DAY WITH A MEAL 60 tablet 8  . citalopram (CELEXA) 40 MG tablet Take 40 mg by mouth daily.  11  . HYDROcodone-acetaminophen (NORCO) 10-325 MG tablet Take 1 tablet by mouth every 4 (four) hours as needed for moderate pain.   0  . levETIRAcetam (KEPPRA) 750 MG tablet Take 1 tablet (750 mg total) by mouth 2 (two) times daily. 60 tablet 5  . levothyroxine (SYNTHROID) 125 MCG tablet TAKE 1 TABLET BY MOUTH EVERY DAY BEFORE BREAKFAST 30 tablet 0  . lidocaine-prilocaine (EMLA) cream Apply 1 application topically as needed. 30 g 0  . lisinopril (PRINIVIL,ZESTRIL) 2.5 MG tablet Take 1 tablet (2.5 mg total) by mouth daily. 30 tablet 12  . nitroGLYCERIN (NITROSTAT) 0.4 MG SL tablet Place 1 tablet (0.4 mg total) under the tongue every 5 (five) minutes x 3 doses as needed for chest pain. 25 tablet 2  . prochlorperazine (COMPAZINE) 10 MG tablet Take 1 tablet (10 mg total) by mouth every 6 (six) hours as needed for nausea or vomiting. 30 tablet 0  . spironolactone (ALDACTONE) 25 MG tablet TAKE 1 TABLET BY MOUTH EVERY DAY 90 tablet 3  . sucralfate (CARAFATE) 1 g tablet Take 1 tablet (1 g total) by mouth 4 (four) times daily -  with meals and at bedtime. 5 min before meals for radiation induced esophagitis 120 tablet 2  . tamsulosin (FLOMAX) 0.4 MG CAPS capsule Take 1 capsule by mouth daily.    . Tiotropium Bromide-Olodaterol (STIOLTO RESPIMAT) 2.5-2.5 MCG/ACT AERS Inhale 2 puffs into the lungs daily. 1 Inhaler 5  . umeclidinium-vilanterol (ANORO ELLIPTA) 62.5-25 MCG/INH AEPB Inhale 1 puff into the lungs daily. 1 each  0  . furosemide (LASIX) 20 MG tablet Take 1 tablet (20 mg total) by mouth daily. 90 tablet 3   No current facility-administered medications for this visit.     SURGICAL HISTORY:  Past Surgical History:  Procedure Laterality Date  . AORTOGRAM  08/08/2009   for LLE claudication     By Dr. Oneida Alar  . 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 BIOPSY N/A 05/30/2014   Procedure: BIOPSY;  Surgeon: [name], MD;  Location: [facility];  Service: Endoscopy;  Laterality: N/A;  . BLADDER TUMOR EXCISION  8/812  . CARDIAC CATHETERIZATION N/A 05/26/2016   Procedure: Left Heart Cath and Coronary Angiography;  Surgeon: [name], MD;  Location: [facility];  Service: Cardiovascular;  Laterality: N/A;  . CARDIAC CATHETERIZATION N/A 05/26/2016   Procedure: Coronary Stent Intervention;  Surgeon: [name], MD;  Location: [facility];  Service: Cardiovascular;  Laterality: N/A;  . COLONOSCOPY WITH PROPOFOL N/A 05/30/2014   GYI:RSWNIO colonic polyp-likely source of hematochezia-removed as described above  . ESOPHAGOGASTRODUODENOSCOPY (EGD) WITH PROPOFOL  N/A 05/30/2014   GXQ:JJHERD and bulbar erosions s/p gastric biopsy. No evidence of portal gastropathy on today's examination.  . FEMORAL-TIBIAL BYPASS GRAFT  2009   Right side using non-reversed GSV   By Dr. Oneida Alar  . FEMORAL-TIBIAL BYPASS GRAFT  11/24/2007   Right femoral to anterior tibial BPG   by Dr. Oneida Alar  . FINGER SURGERY Left    "straightened my pinky"  . HAND TENDON SURGERY Left 2013   Left 5th finger  . HERNIA REPAIR     "stomach"  . ICD IMPLANT N/A 03/02/2018   Procedure: ICD IMPLANT;  Surgeon: [name], MD;  Location: [facility];  Service: Cardiovascular;  Laterality: N/A;  . INCISIONAL HERNIA REPAIR N/A 12/25/2014   Procedure: HERNIA REPAIR INCISIONAL WITH MESH;  Surgeon: [name], MD;  Location: [facility];  Service: General;  Laterality: N/A;  . INSERTION OF MESH N/A 12/25/2014   Procedure:  INSERTION OF MESH;  Surgeon: [name], MD;  Location: [facility];  Service: General;  Laterality: N/A;  . IR IMAGING GUIDED PORT INSERTION  04/11/2018  . LAPAROSCOPIC CHOLECYSTECTOMY    . LOWER EXTREMITY ANGIOGRAM Left 12/30/2015   Procedure: Lower Extremity Angiogram;  Surgeon: [name], MD;  Location: [facility];  Service: Cardiovascular;  Laterality: Left;  . PERIPHERAL VASCULAR CATHETERIZATION N/A 12/30/2015   Procedure: Abdominal Aortogram;  Surgeon: [name], MD;  Location: [facility];  Service: Cardiovascular;  Laterality: N/A;  . POLYPECTOMY  05/30/2014   Procedure: POLYPECTOMY;  Surgeon: [name], MD;  Location: [facility];  Service: Endoscopy;;  . TONSILLECTOMY AND ADENOIDECTOMY  ~ 1970  . TYMPANOSTOMY TUBE PLACEMENT Bilateral ~ 1970    REVIEW OF SYSTEMS:   Review of Systems  Constitutional: Negative for appetite change, chills, fatigue, fever and unexpected weight change.  HENT:   Negative for mouth sores, nosebleeds, sore throat and trouble swallowing.   Eyes: Negative for eye problems and icterus.  Respiratory: Positive for baseline productive cough. Negative for hemoptysis, shortness of breath and wheezing.   Cardiovascular: Negative for chest pain and leg swelling.  Gastrointestinal: Negative for abdominal pain, constipation, diarrhea, nausea and vomiting.  Genitourinary: Negative for bladder incontinence, difficulty urinating, dysuria, frequency and hematuria.   Musculoskeletal: Negative for back pain, gait problem, neck pain and neck stiffness.  Skin: Positive for a skin abrasion on his left arm. Negative for itching and rash.  Neurological: Negative for dizziness, extremity weakness, gait problem, headaches, light-headedness and seizures.  Hematological: Negative for adenopathy. Does not bruise/bleed easily.  Psychiatric/Behavioral: Negative for confusion, depression and sleep disturbance. The patient is not nervous/anxious.     PHYSICAL  EXAMINATION:  Blood pressure 120/71, pulse 70, temperature 98.4 F (36.9 C), temperature source Oral, resp. rate 18, height '6\' 1"'$  (1.854 m), weight 230 lb 1.6 oz (104.4 kg), SpO2 96 %.  ECOG PERFORMANCE STATUS: 1 - Symptomatic but completely ambulatory  Physical Exam  Constitutional: Oriented to person, place, and time and well-developed, well-nourished, and in no distress.  HENT:  Head: Normocephalic and atraumatic.  Mouth/Throat: Oropharynx is clear and moist. No oropharyngeal exudate.  Eyes: Conjunctivae are normal. Right eye exhibits no discharge. Left eye exhibits no discharge. No scleral icterus.  Neck: Normal range of motion. Neck supple.  Cardiovascular: Normal rate, regular rhythm, normal heart sounds and intact distal pulses.   Pulmonary/Chest: Effort normal and breath sounds normal except for wheezing in the right lower lobe. No respiratory distress. No rales.  Abdominal: Soft. Bowel sounds are normal. Exhibits no distension and no mass. There is no tenderness.  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 Normal range of motion. Exhibits no edema.  Lymphadenopathy:    No cervical adenopathy.  Neurological: Sensation and strength intact over his left hand distal to the injury. Alert and oriented to person, place, and time. Exhibits normal muscle tone. Gait normal. Coordination normal.  Skin: The patient has a skin abrasion on the posterior side of his forearm. There is some serous drainage on the dressing. No active bleeding. The wound appears clean. There is no mucopurulent drainage. Skin is warm and dry. No rash noted. Not diaphoretic. No pallor.  Psychiatric: Mood, memory and judgment normal.  Vitals reviewed.  LABORATORY DATA: Lab Results  Component Value Date   WBC 7.8 02/27/2019   HGB 13.5 02/27/2019   HCT 41.6 02/27/2019   MCV 96.7 02/27/2019   PLT 194 02/27/2019      Chemistry      Component Value Date/Time   NA 137 02/27/2019 1306   NA 139 02/27/2018 1457    K 4.1 02/27/2019 1306   CL 101 02/27/2019 1306   CO2 29 02/27/2019 1306   BUN 12 02/27/2019 1306   BUN 7 02/27/2018 1457   CREATININE 1.06 02/27/2019 1306   CREATININE 1.13 06/28/2016 1453      Component Value Date/Time   CALCIUM 8.8 (L) 02/27/2019 1306   ALKPHOS 121 02/27/2019 1306   AST 37 02/27/2019 1306   ALT 53 (H) 02/27/2019 1306   BILITOT 0.3 02/27/2019 1306       RADIOGRAPHIC STUDIES:  No results found.   ASSESSMENT/PLAN:  This is a very pleasant 58 year old Caucasian male with stage IIIa non-small cell lung cancer, adenosquamous carcinoma.  He presented with a large right upper lobe lung mass with questionable chest wall invasion as well as right hilar and mediastinal lymphadenopathy.  He was diagnosed in June 2019. He completed 6 cycles of concurrent chemoradiation with carboplatin and paclitaxel.  He had a partial response to treatment. The patient is currently undergoing consolidation immunotherapy with Imfinzi 10 mg/kg IV every 2 weeks.  He is status post 17 cycles.  He continues to tolerate treatment well without any adverse effects.  The patient seen with Dr. Julien Nordmann today.  Labs were reviewed.  Recommend that he proceed with cycle #18 today scheduled.  I will arrange for the patient to get the restaging CT scan done prior to his next visit.  I will see the patient back for follow-up visit in 2 weeks for evaluation and to review his scan results before starting cycle #19.  The patient was advised to keep the abrasion clean and dry. The patient was advised to seek medical attention should he develop any new or worsening symptoms such as mucopurulent drainage, fevers, swelling, erythema, pain, etc. He expressed understanding.   The patient's thyroid medication was increased at a prior visit. We will continue to monitor his TSH on routine labs and will make dose adjustments if necessary. We will recheck his thyroid at his next appointment.   The patient was advised  to quit smoking. The patient understands the importance of smoking cessation; however, he is not ready to quit at this time.   The patient will continue to take his OTC stool softener for constipation since it is reportedly working well for him.  The patient was advised to call immediately if he has any concerning symptoms in the interval. The patient voices understanding  of current disease status and treatment options and is in agreement with the current care plan. All questions were answered. The patient knows to call the clinic with any problems, questions or concerns. We can certainly see the patient much sooner if necessary  Orders Placed This Encounter  Procedures  . CT Chest W Contrast    Standing Status:   Future    Standing Expiration Date:   02/27/2020    Order Specific Question:   ** REASON FOR EXAM (FREE TEXT)    Answer:   Restaging Lung Cancer    Order Specific Question:   If indicated for the ordered procedure, I authorize the administration of contrast media per Radiology protocol    Answer:   Yes    Order Specific Question:   Preferred imaging location?    Answer:   [facility]    Order Specific Question:   Radiology Contrast Protocol - do NOT remove file path    Answer:   [email or web address]     Aspynn Clover L Norwin Aleman, PA-C 02/27/19  ADDENDUM: Hematology/Oncology Attending: I had a face-to-face encounter with the patient today.  I recommended his care plan.  This is a very pleasant 58 years old white male with a stage IIIa non-small cell lung cancer, adenocarcinoma status post a course of induction concurrent chemoradiation and he is currently undergoing consolidation treatment with immunotherapy with Imfinzi status post 17 cycles.  The patient has been tolerating this treatment well with no concerning adverse effects. I recommended for him to proceed with cycle 18 today as planned. He will come back for follow-up visit in 2 weeks for  evaluation after repeating CT scan of the chest for restaging of his disease. The patient was advised to call immediately if he has any concerning symptoms in the interval. Disclaimer: This note was dictated with voice recognition software. Similar sounding words can inadvertently be transcribed and may be missed upon review. Eilleen Kempf, MD 02/27/19

## 2019-02-27 NOTE — Patient Instructions (Signed)

## 2019-02-28 ENCOUNTER — Telehealth: Payer: Self-pay | Admitting: Physician Assistant

## 2019-02-28 NOTE — Telephone Encounter (Signed)
Scheduled appt per 6/2 los - pt to get an updated schedule next visit - additional cycles added to what was already scheduled

## 2019-03-01 ENCOUNTER — Telehealth: Payer: Self-pay | Admitting: Internal Medicine

## 2019-03-01 NOTE — Telephone Encounter (Signed)
[place] off day 6/16 moved appointments to 6/17. Left message. Schedule mailed.

## 2019-03-02 ENCOUNTER — Ambulatory Visit (HOSPITAL_COMMUNITY)
Admission: RE | Admit: 2019-03-02 | Discharge: 2019-03-02 | Disposition: A | Discharge status: Home / Self Care | Payer: Medicare Other | Source: Ambulatory Visit | Attending: Vascular Surgery | Admitting: Vascular Surgery

## 2019-03-02 ENCOUNTER — Encounter: Payer: Self-pay | Admitting: Physician Assistant

## 2019-03-02 ENCOUNTER — Ambulatory Visit (INDEPENDENT_AMBULATORY_CARE_PROVIDER_SITE_OTHER): Payer: Medicare Other | Admitting: Physician Assistant

## 2019-03-02 ENCOUNTER — Other Ambulatory Visit: Payer: Self-pay

## 2019-03-02 DIAGNOSIS — I739 Peripheral vascular disease, unspecified: Secondary | ICD-10-CM

## 2019-03-02 NOTE — Progress Notes (Signed)
History of Present Illness:  Patient is a 58 y.o. year old male who presents for evaluation of known PAD.  He presents to go over ABIs.  Patient is status post a right below the knee amputation.  Surgical history also significant for left common iliac artery stenting.  He has a known left SFA and popliteal occlusion.  Patient had been scheduled for a left femoral distal bypass in the past however he was able to heal a wound on his foot prior to surgery day and thus surgery was canceled.        Virtual Visit via Telephone Note    I connected with [name] on 03/02/2019 using the [email or web address] by telephone and verified that I was speaking with the correct person using two identifiers. Patient was located in his car.  He was seen for his ABI study and left before he was seen in my clinic.    The limitations of evaluation and management by telemedicine and the availability of in person appointments have been previously discussed with the patient and are documented in the patients chart. The patient expressed understanding and consented to proceed.  PCP: Redmond School, MD   Chief Complaint: PAD f/u    Past Medical History:  Diagnosis Date  . Acute hepatitis C virus infection 06/19/2007   Qualifier: Diagnosis of  By: Leilani Merl CMA, Tiffany    . Acute ST elevation myocardial infarction (STEMI) involving left anterior descending (LAD) coronary artery ([facility]) 05/26/2016  . Acute ST elevation myocardial infarction (STEMI) of lateral wall ([facility]) 05/26/2016  . 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 Anxiety   . Asthma   . Atherosclerosis of native arteries of the extremities with intermittent claudication 12/16/2011  . Chronic hepatitis C without hepatic coma ([facility]) 05/03/2014  . COPD with chronic bronchitis and emphysema ([facility]) 03/13/2018   FEV1 57%  . Depression   . DVT (deep venous thrombosis) ([facility]) 2009; 2019   RLE; LLE  . Encounter for antineoplastic chemotherapy  03/22/2018  . Hepatitis C    "tx'd in 2015"  . High cholesterol   . Ischemic cardiomyopathy 03/02/2018  . Non-small cell carcinoma of right lung, stage 3 ([facility]) 03/22/2018  . PAD (peripheral artery disease) ([facility]) 01/25/2013  . Peripheral vascular disease, unspecified 07/20/2012  . Port-A-Cath in place 04/24/2018  . ST elevation myocardial infarction involving left anterior descending (LAD) coronary artery ([place])   . STEMI (ST elevation myocardial infarction) ([facility]) 05/26/2016  . Tobacco abuse 01/28/2016  . Tubular adenoma of colon 07/11/2014  . Urinary dribbling     Past Surgical History:  Procedure Laterality Date  . AORTOGRAM  08/08/2009   for LLE claudication     By Dr. Oneida Alar  . 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 BIOPSY N/A 05/30/2014   Procedure: BIOPSY;  Surgeon: [name], MD;  Location: [facility];  Service: Endoscopy;  Laterality: N/A;  . BLADDER TUMOR EXCISION  8/812  . CARDIAC CATHETERIZATION N/A 05/26/2016   Procedure: Left Heart Cath and Coronary Angiography;  Surgeon: [name], MD;  Location: [facility];  Service: Cardiovascular;  Laterality: N/A;  . CARDIAC CATHETERIZATION N/A 05/26/2016   Procedure: Coronary Stent Intervention;  Surgeon: [name], MD;  Location: [facility];  Service: Cardiovascular;  Laterality: N/A;  . COLONOSCOPY WITH PROPOFOL N/A 05/30/2014   HKV:QQVZDG colonic polyp-likely source of hematochezia-removed as described above  . ESOPHAGOGASTRODUODENOSCOPY (EGD) WITH PROPOFOL N/A 05/30/2014   LOV:FIEPPI  and bulbar erosions s/p gastric biopsy. No evidence of portal gastropathy on today's examination.  . FEMORAL-TIBIAL BYPASS GRAFT  2009   Right side using non-reversed GSV   By Dr. Oneida Alar  . FEMORAL-TIBIAL BYPASS GRAFT  11/24/2007   Right femoral to anterior tibial BPG   by Dr. Oneida Alar  . FINGER SURGERY Left    "straightened my pinky"  . HAND TENDON SURGERY Left 2013   Left 5th finger  . HERNIA REPAIR     "stomach"  . ICD  IMPLANT N/A 03/02/2018   Procedure: ICD IMPLANT;  Surgeon: [name], MD;  Location: [facility];  Service: Cardiovascular;  Laterality: N/A;  . INCISIONAL HERNIA REPAIR N/A 12/25/2014   Procedure: HERNIA REPAIR INCISIONAL WITH MESH;  Surgeon: [name], MD;  Location: [facility];  Service: General;  Laterality: N/A;  . INSERTION OF MESH N/A 12/25/2014   Procedure: INSERTION OF MESH;  Surgeon: [name], MD;  Location: [facility];  Service: General;  Laterality: N/A;  . IR IMAGING GUIDED PORT INSERTION  04/11/2018  . LAPAROSCOPIC CHOLECYSTECTOMY    . LOWER EXTREMITY ANGIOGRAM Left 12/30/2015   Procedure: Lower Extremity Angiogram;  Surgeon: [name], MD;  Location: [facility];  Service: Cardiovascular;  Laterality: Left;  . PERIPHERAL VASCULAR CATHETERIZATION N/A 12/30/2015   Procedure: Abdominal Aortogram;  Surgeon: [name], MD;  Location: [facility];  Service: Cardiovascular;  Laterality: N/A;  . POLYPECTOMY  05/30/2014   Procedure: POLYPECTOMY;  Surgeon: [name], MD;  Location: [facility];  Service: Endoscopy;;  . TONSILLECTOMY AND ADENOIDECTOMY  ~ 1970  . TYMPANOSTOMY TUBE PLACEMENT Bilateral ~ 1970    No outpatient medications have been marked as taking for the 03/02/19 encounter (Appointment) with Ulyses Amor, PA-C.    12 system ROS was negative unless otherwise noted in HPI   Observations/Objective:  ABI ABI Findings: +--------+------------------+-----+--------+--------+ Right   Rt Pressure (mmHg)IndexWaveformComment  +--------+------------------+-----+--------+--------+ Brachial109                                     +--------+------------------+-----+--------+--------+  +---------+------------------+-----+----------+-------------------------+ Left     Lt Pressure (mmHg)IndexWaveform  Comment                   +---------+------------------+-----+----------+-------------------------+ Brachial 104                                                         +---------+------------------+-----+----------+-------------------------+ PTA      31                0.28 monophasicdampened                  +---------+------------------+-----+----------+-------------------------+ DP       60                0.55 monophasicdampened                  +---------+------------------+-----+----------+-------------------------+ Great Toe                                 unable to obtain movement +---------+------------------+-----+----------+-------------------------+  +-------+-----------+-----------+------------+------------+ ABI/TBIToday's ABIToday's TBIPrevious ABIPrevious TBI +-------+-----------+-----------+------------+------------+ Right  BKA        -          -           -            +-------+-----------+-----------+------------+------------+ Left   0.55                  0.46        0.35         +-------+-----------+-----------+------------+------------+   Assessment and Plan: 58 y.o. male who presents to go over vascular studies; he is followed for PAD with history of right BKA and known left SFA and popliteal occlusion.  He has stable ABI's and states he has no open wounds or abnormal skin discoloration.    He is continuing a walking program daily and was encouraged to stop smoking.    Follow Up Instructions:   Follow up 1 year for repeat ABI's   I discussed the assessment and treatment plan with the patient. The patient was provided an opportunity to ask questions and all were answered. The patient agreed with the plan and demonstrated an understanding of the instructions.   The patient was advised to call back or seek an in-person evaluation if the symptoms worsen or if the condition fails to improve as anticipated.  I spent 10 minutes with the patient via telephone encounter.   Signed, Roxy Horseman PA-C [facility]  Office: [phone]  03/02/2019, 3:40 PM

## 2019-03-06 ENCOUNTER — Ambulatory Visit (INDEPENDENT_AMBULATORY_CARE_PROVIDER_SITE_OTHER): Payer: Medicare Other | Admitting: *Deleted

## 2019-03-06 DIAGNOSIS — I255 Ischemic cardiomyopathy: Secondary | ICD-10-CM

## 2019-03-06 LAB — CUP PACEART REMOTE DEVICE CHECK
Battery Remaining Longevity: 132 mo
Battery Voltage: 3.02 V
Brady Statistic RV Percent Paced: 0.01 %
Date Time Interrogation Session: 20200609052402
HighPow Impedance: 83 Ohm
Implantable Lead Implant Date: 20190606
Implantable Lead Location: 753860
Implantable Pulse Generator Implant Date: 20190606
Lead Channel Impedance Value: 418 Ohm
Lead Channel Impedance Value: 475 Ohm
Lead Channel Pacing Threshold Amplitude: 0.875 V
Lead Channel Pacing Threshold Pulse Width: 0.4 ms
Lead Channel Sensing Intrinsic Amplitude: 23.25 mV
Lead Channel Sensing Intrinsic Amplitude: 23.25 mV
Lead Channel Setting Pacing Amplitude: 2.5 V
Lead Channel Setting Pacing Pulse Width: 0.4 ms
Lead Channel Setting Sensing Sensitivity: 0.3 mV

## 2019-03-09 ENCOUNTER — Ambulatory Visit (HOSPITAL_COMMUNITY)
Admission: RE | Admit: 2019-03-09 | Discharge: 2019-03-09 | Disposition: A | Discharge status: Home / Self Care | Payer: Medicare Other | Source: Ambulatory Visit | Attending: Physician Assistant | Admitting: Physician Assistant

## 2019-03-09 ENCOUNTER — Encounter (HOSPITAL_COMMUNITY): Payer: Self-pay

## 2019-03-09 ENCOUNTER — Other Ambulatory Visit: Payer: Self-pay

## 2019-03-09 DIAGNOSIS — C3491 Malignant neoplasm of unspecified part of right bronchus or lung: Secondary | ICD-10-CM | POA: Diagnosis not present

## 2019-03-09 DIAGNOSIS — C3490 Malignant neoplasm of unspecified part of unspecified bronchus or lung: Secondary | ICD-10-CM | POA: Diagnosis not present

## 2019-03-09 MED ORDER — HEPARIN SOD (PORK) LOCK FLUSH 100 UNIT/ML IV SOLN
500.0000 [IU] | Freq: Once | INTRAVENOUS | Status: AC
  Administered 2019-03-09: 500 [IU] via INTRAVENOUS

## 2019-03-09 MED ORDER — IOHEXOL 300 MG/ML  SOLN
75.0000 mL | Freq: Once | INTRAMUSCULAR | Status: AC | PRN
  Administered 2019-03-09: 75 mL via INTRAVENOUS

## 2019-03-09 MED ORDER — SODIUM CHLORIDE (PF) 0.9 % IJ SOLN
INTRAMUSCULAR | Status: AC
  Filled 2019-03-09: qty 50

## 2019-03-09 MED ORDER — HEPARIN SOD (PORK) LOCK FLUSH 100 UNIT/ML IV SOLN
INTRAVENOUS | Status: AC
  Filled 2019-03-09: qty 5

## 2019-03-13 ENCOUNTER — Other Ambulatory Visit: Payer: Medicare Other

## 2019-03-13 ENCOUNTER — Ambulatory Visit: Payer: Medicare Other | Admitting: Physician Assistant

## 2019-03-13 ENCOUNTER — Ambulatory Visit: Payer: Medicare Other

## 2019-03-14 ENCOUNTER — Other Ambulatory Visit: Payer: Self-pay

## 2019-03-14 ENCOUNTER — Telehealth: Payer: Self-pay | Admitting: Physician Assistant

## 2019-03-14 ENCOUNTER — Inpatient Hospital Stay (HOSPITAL_BASED_OUTPATIENT_CLINIC_OR_DEPARTMENT_OTHER): Payer: Medicare Other | Admitting: Physician Assistant

## 2019-03-14 ENCOUNTER — Inpatient Hospital Stay: Payer: Medicare Other

## 2019-03-14 ENCOUNTER — Encounter: Payer: Self-pay | Admitting: Physician Assistant

## 2019-03-14 VITALS — BP 104/72 | HR 70 | Temp 98.5°F | Resp 18 | Ht 73.0 in | Wt 230.4 lb

## 2019-03-14 DIAGNOSIS — C3491 Malignant neoplasm of unspecified part of right bronchus or lung: Secondary | ICD-10-CM

## 2019-03-14 DIAGNOSIS — Z720 Tobacco use: Secondary | ICD-10-CM

## 2019-03-14 DIAGNOSIS — R911 Solitary pulmonary nodule: Secondary | ICD-10-CM

## 2019-03-14 DIAGNOSIS — Z5112 Encounter for antineoplastic immunotherapy: Secondary | ICD-10-CM | POA: Diagnosis not present

## 2019-03-14 DIAGNOSIS — C3411 Malignant neoplasm of upper lobe, right bronchus or lung: Secondary | ICD-10-CM

## 2019-03-14 DIAGNOSIS — Z79899 Other long term (current) drug therapy: Secondary | ICD-10-CM | POA: Diagnosis not present

## 2019-03-14 DIAGNOSIS — Z95828 Presence of other vascular implants and grafts: Secondary | ICD-10-CM

## 2019-03-14 LAB — CMP (CANCER CENTER ONLY)
ALT: 25 U/L (ref 0–44)
AST: 21 U/L (ref 15–41)
Albumin: 4.1 g/dL (ref 3.5–5.0)
Alkaline Phosphatase: 127 U/L — ABNORMAL HIGH (ref 38–126)
Anion gap: 9 (ref 5–15)
BUN: 9 mg/dL (ref 6–20)
CO2: 29 mmol/L (ref 22–32)
Calcium: 9.1 mg/dL (ref 8.9–10.3)
Chloride: 99 mmol/L (ref 98–111)
Creatinine: 1.15 mg/dL (ref 0.61–1.24)
GFR, Est AFR Am: >60 mL/min (ref >60)
GFR, Estimated: >60 mL/min (ref >60)
Glucose, Bld: 81 mg/dL (ref 70–99)
Potassium: 4.4 mmol/L (ref 3.5–5.1)
Sodium: 137 mmol/L (ref 135–145)
Total Bilirubin: 0.3 mg/dL (ref 0.3–1.2)
Total Protein: 7.4 g/dL (ref 6.5–8.1)

## 2019-03-14 LAB — CBC WITH DIFFERENTIAL (CANCER CENTER ONLY)
Abs Immature Granulocytes: 0.07 10*3/uL (ref 0.00–0.07)
Basophils Absolute: 0.1 10*3/uL (ref 0.0–0.1)
Basophils Relative: 1 %
Eosinophils Absolute: 0.4 10*3/uL (ref 0.0–0.5)
Eosinophils Relative: 5 %
HCT: 44 % (ref 39.0–52.0)
Hemoglobin: 14.1 g/dL (ref 13.0–17.0)
Immature Granulocytes: 1 %
Lymphocytes Relative: 11 %
Lymphs Abs: 0.9 10*3/uL (ref 0.7–4.0)
MCH: 31.1 pg (ref 26.0–34.0)
MCHC: 32 g/dL (ref 30.0–36.0)
MCV: 96.9 fL (ref 80.0–100.0)
Monocytes Absolute: 0.6 10*3/uL (ref 0.1–1.0)
Monocytes Relative: 8 %
Neutro Abs: 5.9 10*3/uL (ref 1.7–7.7)
Neutrophils Relative %: 74 %
Platelet Count: 199 10*3/uL (ref 150–400)
RBC: 4.54 MIL/uL (ref 4.22–5.81)
RDW: 13.1 % (ref 11.5–15.5)
WBC Count: 8 10*3/uL (ref 4.0–10.5)
nRBC: 0 % (ref 0.0–0.2)

## 2019-03-14 LAB — TSH: TSH: 6.814 u[IU]/mL — ABNORMAL HIGH (ref 0.320–4.118)

## 2019-03-14 MED ORDER — SODIUM CHLORIDE 0.9% FLUSH
10.0000 mL | INTRAVENOUS | Status: DC | PRN
  Administered 2019-03-14: 10 mL
  Filled 2019-03-14: qty 10

## 2019-03-14 MED ORDER — SODIUM CHLORIDE 0.9% FLUSH
10.0000 mL | Freq: Once | INTRAVENOUS | Status: AC
  Administered 2019-03-14: 10 mL
  Filled 2019-03-14: qty 10

## 2019-03-14 MED ORDER — SODIUM CHLORIDE 0.9 % IV SOLN
9.6000 mg/kg | Freq: Once | INTRAVENOUS | Status: AC
  Administered 2019-03-14: 1000 mg via INTRAVENOUS
  Filled 2019-03-14: qty 20

## 2019-03-14 MED ORDER — HEPARIN SOD (PORK) LOCK FLUSH 100 UNIT/ML IV SOLN
500.0000 [IU] | Freq: Once | INTRAVENOUS | Status: AC | PRN
  Administered 2019-03-14: 500 [IU]
  Filled 2019-03-14: qty 5

## 2019-03-14 MED ORDER — SODIUM CHLORIDE 0.9 % IV SOLN
Freq: Once | INTRAVENOUS | Status: AC
  Administered 2019-03-14: 14:00:00 via INTRAVENOUS
  Filled 2019-03-14: qty 250

## 2019-03-14 NOTE — Patient Instructions (Signed)
[facility] Discharge Instructions for Patients Receiving Chemotherapy  Today you received the following chemotherapy agents Durvalumab (IMFINZI).  To help prevent nausea and vomiting after your treatment, we encourage you to take your nausea medication as prescribed.   If you develop nausea and vomiting that is not controlled by your nausea medication, call the clinic.   BELOW ARE SYMPTOMS THAT SHOULD BE REPORTED IMMEDIATELY:  *FEVER GREATER THAN 100.5 F  *CHILLS WITH OR WITHOUT FEVER  NAUSEA AND VOMITING THAT IS NOT CONTROLLED WITH YOUR NAUSEA MEDICATION  *UNUSUAL SHORTNESS OF BREATH  *UNUSUAL BRUISING OR BLEEDING  TENDERNESS IN MOUTH AND THROAT WITH OR WITHOUT PRESENCE OF ULCERS  *URINARY PROBLEMS  *BOWEL PROBLEMS  UNUSUAL RASH Items with * indicate a potential emergency and should be followed up as soon as possible.  Feel free to call the clinic should you have any questions or concerns. The clinic phone number is [phone].  Please show the [facility] at check-in to the Emergency Department and triage nurse.  Coronavirus (COVID-19) Are you at risk?  Are you at risk for the Coronavirus (COVID-19)?  To be considered HIGH RISK for Coronavirus (COVID-19), you have to meet the following criteria:  . Traveled to [place], [place], [place], [place] or [place]; or in the [place] to [place], [place], [place], or [place]; and have fever, cough, and shortness of breath within the last 2 weeks of travel OR . Been in close contact with a person diagnosed with COVID-19 within the last 2 weeks and have fever, cough, and shortness of breath . IF YOU DO NOT MEET THESE CRITERIA, YOU ARE CONSIDERED LOW RISK FOR COVID-19.  What to do if you are HIGH RISK for COVID-19?  [name] If you are having a medical emergency, call 911. . Seek medical care right away. Before you go to a doctor's office, urgent care or emergency department, call ahead and tell them  about your recent travel, contact with someone diagnosed with COVID-19, and your symptoms. You should receive instructions from your physician's office regarding next steps of care.  . When you arrive at healthcare provider, tell the healthcare staff immediately you have returned from visiting [place], [place], [place], [place] or [place]; or traveled in the [place] to [place], [place], [place], or [place]; in the last two weeks or you have been in close contact with a person diagnosed with COVID-19 in the last 2 weeks.   . Tell the health care staff about your symptoms: fever, cough and shortness of breath. . After you have been seen by a medical provider, you will be either: o Tested for (COVID-19) and discharged home on quarantine except to seek medical care if symptoms worsen, and asked to  - Stay home and avoid contact with others until you get your results (4-5 days)  - Avoid travel on public transportation if possible (such as bus, train, or airplane) or o Sent to the Emergency Department by EMS for evaluation, COVID-19 testing, and possible admission depending on your condition and test results.  What to do if you are LOW RISK for COVID-19?  Reduce your risk of any infection by using the same precautions used for avoiding the common cold or flu:  [name] Wash your hands often with soap and warm water for at least 20 seconds.  If soap and water are not readily available, use an alcohol-based hand sanitizer with at least 60% alcohol.  . If coughing or  sneezing, cover your mouth and nose by coughing or sneezing into the elbow areas of your shirt or coat, into a tissue or into your sleeve (not your hands). . Avoid shaking hands with others and consider head nods or verbal greetings only. . Avoid touching your eyes, nose, or mouth with unwashed hands.  . Avoid close contact with people who are sick. . Avoid places or events with large numbers of people in one location, like concerts or  sporting events. . Carefully consider travel plans you have or are making. . If you are planning any travel outside or inside the [place], visit the CDC's Travelers' Health webpage for the latest health notices. . If you have some symptoms but not all symptoms, continue to monitor at home and seek medical attention if your symptoms worsen. . If you are having a medical emergency, call 911.   [place] / e-Visit: [email or web address]         [facility]: [facility]: [phone]                   [facility]: [phone]

## 2019-03-14 NOTE — Telephone Encounter (Signed)
The patient requested that I call his wife to notify her of his scan results and what the treatment plan is going forward. I was unable to reach the patient's spouse. I left a message with our main office number. I stated I would try again tomorrow to reach her.

## 2019-03-14 NOTE — Progress Notes (Signed)
[name] OFFICE PROGRESS NOTE  Redmond School, MD [address] [place] [address]  DIAGNOSIS: Stage IIIA (T3, N2, M0) non-small cell lung cancer, adenosquamous carcinoma diagnosed in June 2019 and presented with large right upper lobe lung mass with questionable chest wall invasion as well as right hilar and mediastinal lymphadenopathy.  Biomarker Findings Tumor Mutational Burden - TMB-High (21 Muts/Mb) Microsatellite status - MS-Stable Genomic Findings For a complete list of the genes assayed, please refer to the Appendix. ATM D6644* CCND2 P281R KRAS G12V CHEK2 T361f*15 TP53 E298* 7 Disease relevant genes with no reportable alterations: ALK, EGFR, BRAF, MET, RET, ERBB2, ROS1  PRIOR THERAPY: Concurrent chemoradiation with chemotherapy consisting of weekly carboplatin for an AUC of 2 and paclitaxel 45 mg/m2. First dose given on 04/03/2018.Status post 6 cycles. Last dose was giving 05/15/2018 with partial response  CURRENT THERAPY: Consolidation treatment with immunotherapy with Imfinzi (Durvalumab) 10 mg/KG every 2 weeks. First dose 06/20/2018. Status post 18cycles  INTERVAL HISTORY: Mark FELTER58y.o. male returns to the clinic for a follow-up visit.  The patient is feeling well today without any concerning complaints.  He continues to tolerate his treatment with immunotherapy well without any adverse effects.  He denies any fever, chills, night sweats, or weight loss.  He denies any chest pain, shortness of breath, cough, or hemoptysis.  He denies any nausea, vomiting, diarrhea, or constipation.  He denies any headache or visual changes. He denies any rashes or skin changes. The patient is trying to cut back on his cigarette smoking.  He states that he is down to approximately 4 cigarettes/day.  The patient recently had a restaging CT scan performed.  He is here for evaluation and to review his scan results before starting cycle #19 today as  scheduled.  MEDICAL HISTORY: Past Medical History:  Diagnosis Date  . Acute hepatitis C virus infection 06/19/2007   Qualifier: Diagnosis of  By: MLeilani MerlCMA, Tiffany    . Acute ST elevation myocardial infarction (STEMI) involving left anterior descending (LAD) coronary artery ([facility] 05/26/2016  . Acute ST elevation myocardial infarction (STEMI) of lateral wall ([facility] 05/26/2016  . 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 Anxiety   . Asthma   . Atherosclerosis of native arteries of the extremities with intermittent claudication 12/16/2011  . Chronic hepatitis C without hepatic coma ([facility] 05/03/2014  . COPD with chronic bronchitis and emphysema ([facility] 03/13/2018   FEV1 57%  . Depression   . DVT (deep venous thrombosis) ([facility] 2009; 2019   RLE; LLE  . Encounter for antineoplastic chemotherapy 03/22/2018  . Hepatitis C    "tx'd in 2015"  . High cholesterol   . Ischemic cardiomyopathy 03/02/2018  . Non-small cell carcinoma of right lung, stage 3 ([facility] 03/22/2018  . PAD (peripheral artery disease) ([facility] 01/25/2013  . Peripheral vascular disease, unspecified 07/20/2012  . Port-A-Cath in place 04/24/2018  . ST elevation myocardial infarction involving left anterior descending (LAD) coronary artery ([facility]   . STEMI (ST elevation myocardial infarction) ([facility] 05/26/2016  . Tobacco abuse 01/28/2016  . Tubular adenoma of colon 07/11/2014  . Urinary dribbling     ALLERGIES:  is allergic to lyrica [pregabalin] and neurontin [gabapentin].  MEDICATIONS:  Current Outpatient Medications  Medication Sig Dispense Refill  . alprazolam (XANAX) 2 MG tablet Take 2 mg by mouth 4 (four) times daily as needed for anxiety.     .Marland Kitchenamitriptyline (ELAVIL) 100 MG tablet Take 100 mg by mouth at bedtime.     .[name]  aspirin 81 MG EC tablet Take 81 mg by mouth daily.      [name] atorvastatin (LIPITOR) 80 MG tablet Take 1 tablet (80 mg total) by mouth daily at 6 PM. 30 tablet 12  . carvedilol (COREG) 6.25 MG tablet  TAKE 1 TABLET BY MOUTH TWICE A DAY WITH A MEAL 60 tablet 8  . citalopram (CELEXA) 40 MG tablet Take 40 mg by mouth daily.  11  . HYDROcodone-acetaminophen (NORCO) 10-325 MG tablet Take 1 tablet by mouth every 4 (four) hours as needed for moderate pain.   0  . levETIRAcetam (KEPPRA) 750 MG tablet Take 1 tablet (750 mg total) by mouth 2 (two) times daily. 60 tablet 5  . levothyroxine (SYNTHROID) 125 MCG tablet TAKE 1 TABLET BY MOUTH EVERY DAY BEFORE BREAKFAST 30 tablet 0  . lidocaine-prilocaine (EMLA) cream Apply 1 application topically as needed. 30 g 0  . lisinopril (PRINIVIL,ZESTRIL) 2.5 MG tablet Take 1 tablet (2.5 mg total) by mouth daily. 30 tablet 12  . prochlorperazine (COMPAZINE) 10 MG tablet Take 1 tablet (10 mg total) by mouth every 6 (six) hours as needed for nausea or vomiting. 30 tablet 0  . spironolactone (ALDACTONE) 25 MG tablet TAKE 1 TABLET BY MOUTH EVERY DAY 90 tablet 3  . sucralfate (CARAFATE) 1 g tablet Take 1 tablet (1 g total) by mouth 4 (four) times daily -  with meals and at bedtime. 5 min before meals for radiation induced esophagitis 120 tablet 2  . tamsulosin (FLOMAX) 0.4 MG CAPS capsule Take 1 capsule by mouth daily.    . Tiotropium Bromide-Olodaterol (STIOLTO RESPIMAT) 2.5-2.5 MCG/ACT AERS Inhale 2 puffs into the lungs daily. 1 Inhaler 5  . umeclidinium-vilanterol (ANORO ELLIPTA) 62.5-25 MCG/INH AEPB Inhale 1 puff into the lungs daily. 1 each 0  . furosemide (LASIX) 20 MG tablet Take 1 tablet (20 mg total) by mouth daily. 90 tablet 3  . nitroGLYCERIN (NITROSTAT) 0.4 MG SL tablet Place 1 tablet (0.4 mg total) under the tongue every 5 (five) minutes x 3 doses as needed for chest pain. (Patient not taking: Reported on 03/14/2019) 25 tablet 2   No current facility-administered medications for this visit.    Facility-Administered Medications Ordered in Other Visits  Medication Dose Route Frequency Provider Last Rate Last Dose  . durvalumab (IMFINZI) 1,000 mg in sodium  chloride 0.9 % 100 mL chemo infusion  9.6 mg/kg (Treatment Plan Recorded) Intravenous Once Curt Bears, MD      . heparin lock flush 100 unit/mL  500 Units Intracatheter Once PRN Curt Bears, MD      . sodium chloride flush (NS) 0.9 % injection 10 mL  10 mL Intracatheter PRN Curt Bears, MD        SURGICAL HISTORY:  Past Surgical History:  Procedure Laterality Date  . AORTOGRAM  08/08/2009   for LLE claudication     By Dr. Oneida Alar  . 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 BIOPSY N/A 05/30/2014   Procedure: BIOPSY;  Surgeon: [name], MD;  Location: [facility];  Service: Endoscopy;  Laterality: N/A;  . BLADDER TUMOR EXCISION  8/812  . CARDIAC CATHETERIZATION N/A 05/26/2016   Procedure: Left Heart Cath and Coronary Angiography;  Surgeon: [name], MD;  Location: [facility];  Service: Cardiovascular;  Laterality: N/A;  . CARDIAC CATHETERIZATION N/A 05/26/2016   Procedure: Coronary Stent Intervention;  Surgeon: [name], MD;  Location: [facility];  Service: Cardiovascular;  Laterality: N/A;  .  COLONOSCOPY WITH PROPOFOL N/A 05/30/2014   LPF:XTKWIO colonic polyp-likely source of hematochezia-removed as described above  . ESOPHAGOGASTRODUODENOSCOPY (EGD) WITH PROPOFOL N/A 05/30/2014   XBD:ZHGDJM and bulbar erosions s/p gastric biopsy. No evidence of portal gastropathy on today's examination.  . FEMORAL-TIBIAL BYPASS GRAFT  2009   Right side using non-reversed GSV   By Dr. Oneida Alar  . FEMORAL-TIBIAL BYPASS GRAFT  11/24/2007   Right femoral to anterior tibial BPG   by Dr. Oneida Alar  . FINGER SURGERY Left    "straightened my pinky"  . HAND TENDON SURGERY Left 2013   Left 5th finger  . HERNIA REPAIR     "stomach"  . ICD IMPLANT N/A 03/02/2018   Procedure: ICD IMPLANT;  Surgeon: [name], MD;  Location: [facility];  Service: Cardiovascular;  Laterality: N/A;  . INCISIONAL HERNIA REPAIR N/A 12/25/2014   Procedure: HERNIA REPAIR INCISIONAL  WITH MESH;  Surgeon: [name], MD;  Location: [facility];  Service: General;  Laterality: N/A;  . INSERTION OF MESH N/A 12/25/2014   Procedure: INSERTION OF MESH;  Surgeon: [name], MD;  Location: [facility];  Service: General;  Laterality: N/A;  . IR IMAGING GUIDED PORT INSERTION  04/11/2018  . LAPAROSCOPIC CHOLECYSTECTOMY    . LOWER EXTREMITY ANGIOGRAM Left 12/30/2015   Procedure: Lower Extremity Angiogram;  Surgeon: [name], MD;  Location: [facility];  Service: Cardiovascular;  Laterality: Left;  . PERIPHERAL VASCULAR CATHETERIZATION N/A 12/30/2015   Procedure: Abdominal Aortogram;  Surgeon: [name], MD;  Location: [facility];  Service: Cardiovascular;  Laterality: N/A;  . POLYPECTOMY  05/30/2014   Procedure: POLYPECTOMY;  Surgeon: [name], MD;  Location: [facility];  Service: Endoscopy;;  . TONSILLECTOMY AND ADENOIDECTOMY  ~ 1970  . TYMPANOSTOMY TUBE PLACEMENT Bilateral ~ 1970    REVIEW OF SYSTEMS:   Review of Systems  Constitutional: Negative for appetite change, chills, fatigue, fever and unexpected weight change.  HENT:   Negative for mouth sores, nosebleeds, sore throat and trouble swallowing.   Eyes: Negative for eye problems and icterus.  Respiratory: Negative for cough, hemoptysis, shortness of breath and wheezing.   Cardiovascular: Negative for chest pain and leg swelling.  Gastrointestinal: Negative for abdominal pain, constipation, diarrhea, nausea and vomiting.  Genitourinary: Negative for bladder incontinence, difficulty urinating, dysuria, frequency and hematuria.   Musculoskeletal: Negative for back pain, gait problem, neck pain and neck stiffness.  Skin: Negative for itching and rash.  Neurological: Negative for dizziness, extremity weakness, gait problem, headaches, light-headedness and seizures.  Hematological: Negative for adenopathy. Does not bruise/bleed easily.  Psychiatric/Behavioral: Negative for confusion, depression and  sleep disturbance. The patient is not nervous/anxious.     PHYSICAL EXAMINATION:  Blood pressure 104/72, pulse 70, temperature 98.5 F (36.9 C), temperature source Oral, resp. rate 18, height _0  (1.854 m), weight 230 lb 6.4 oz (104.5 kg), SpO2 98 %.  ECOG PERFORMANCE STATUS: 1 - Symptomatic but completely ambulatory  Physical Exam  Constitutional: Oriented to person, place, and time and well-developed, well-nourished, and in no distress. No distress.  HENT:  Head: Normocephalic and atraumatic.  Mouth/Throat: Oropharynx is clear and moist. No oropharyngeal exudate.  Eyes: Conjunctivae are normal. Right eye exhibits no discharge. Left eye exhibits no discharge. No scleral icterus.  Neck: Normal range of motion. Neck supple.  Cardiovascular: Normal rate, regular rhythm, normal heart sounds and intact distal pulses.   Pulmonary/Chest: Effort normal and breath sounds normal. No respiratory  distress. No wheezes. No rales.  Abdominal: Soft. Bowel sounds are normal. Exhibits no distension and no mass. There is no tenderness.  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 Normal range of motion. Exhibits no edema.  Lymphadenopathy:    No cervical adenopathy.  Neurological: Alert and oriented to person, place, and time. Exhibits normal muscle tone. Gait normal. Coordination normal.  Skin: The patient has a large scab on his left forearm (improved from prior). Skin is warm and dry. No rash noted. Not diaphoretic. No erythema. No pallor.  Psychiatric: Mood, memory and judgment normal.  Vitals reviewed.  LABORATORY DATA: Lab Results  Component Value Date   WBC 8.0 03/14/2019   HGB 14.1 03/14/2019   HCT 44.0 03/14/2019   MCV 96.9 03/14/2019   PLT 199 03/14/2019      Chemistry      Component Value Date/Time   NA 137 03/14/2019 1300   NA 139 02/27/2018 1457   K 4.4 03/14/2019 1300   CL 99 03/14/2019 1300   CO2 29 03/14/2019 1300   BUN 9 03/14/2019 1300   BUN 7 02/27/2018 1457    CREATININE 1.15 03/14/2019 1300   CREATININE 1.13 06/28/2016 1453      Component Value Date/Time   CALCIUM 9.1 03/14/2019 1300   ALKPHOS 127 (H) 03/14/2019 1300   AST 21 03/14/2019 1300   ALT 25 03/14/2019 1300   BILITOT 0.3 03/14/2019 1300       RADIOGRAPHIC STUDIES:  Ct Chest W Contrast  Result Date: 03/10/2019 CLINICAL DATA:  Follow-up lung cancer.  No complaints EXAM: CT CHEST WITH CONTRAST TECHNIQUE: Multidetector CT imaging of the chest was performed during intravenous contrast administration. CONTRAST:  7m OMNIPAQUE IOHEXOL 300 MG/ML  SOLN COMPARISON:  CT 01/12/19 FINDINGS: Cardiovascular: Coronary artery calcification and aortic atherosclerotic calcification. Stent in the LAD. Port in the anterior chest wall with tip in distal SVC. Pacer wires in the RIGHT heart. Mediastinum/Nodes: No axillary or supraclavicular adenopathy. No mediastinal hilar adenopathy no pericardial effusion. Esophagus normal Lungs/Pleura: Angular band of consolidation in the RIGHT upper lobe measuring 4.2 x 3.7 cm compares to 5.1 by 3.5 cm for no interval change. No new nodularity. There is pleural thickening in the RIGHT upper lobe. Two discrete pulmonary nodules in the superior segment of the RIGHT lower lobe which are increased in size: 8 mm nodule (image 51/7) compares to 6 mm 8 mm nodule on same image compares to 4 mm. Several smaller nodules in the same vicinity as well as the upper lobe are unchanged. LEFT lung is hyperexpanded. Small ground-glass nodule in the LEFT upper lobe on image 36/7 is unchanged. Nodule along the oblique fissure appears benign and unchanged. Upper Abdomen: Limited view of the liver, kidneys, pancreas are unremarkable. Normal adrenal glands. Musculoskeletal: No aggressive osseous lesion IMPRESSION: 1. Interval increase in size of two nodules in the superior segment RIGHT lower lobe. Differential includes progressive infectious / inflammatory process versus lung cancer recurrence. Interval  growth is certainly concerning for lung cancer. 2. Scattered additional nodules in the RIGHT upper lobe and RIGHT lower lobe are stable. 3. Stable post radiation fibrotic mass in the RIGHT upper lobe. Electronically Signed   By: SSuzy BouchardM.D.   On: 03/10/2019 15:31   Vas UKoreaABurnard BuntingWith/wo Tbi  Result Date: 03/02/2019 LOWER EXTREMITY DOPPLER STUDY Indications: Peripheral artery disease. High Risk Factors: Current smoker.  Vascular Interventions: Left CIA stent. Right EIA occlusion. Left SFA/POP  occlusion. Right BKA. Performing Technologist: Alvia Grove RVT  Examination Guidelines: A complete evaluation includes at minimum, Doppler waveform signals and systolic blood pressure reading at the level of bilateral brachial, anterior tibial, and posterior tibial arteries, when vessel segments are accessible. Bilateral testing is considered an integral part of a complete examination. Photoelectric Plethysmograph (PPG) waveforms and toe systolic pressure readings are included as required and additional duplex testing as needed. Limited examinations for reoccurring indications may be performed as noted.  ABI Findings: +--------+------------------+-----+--------+--------+ Right   Rt Pressure (mmHg)IndexWaveformComment  +--------+------------------+-----+--------+--------+ [id number]                                     +--------+------------------+-----+--------+--------+ +---------+------------------+-----+----------+-------------------------+ Left     Lt Pressure (mmHg)IndexWaveform  Comment                   +---------+------------------+-----+----------+-------------------------+ Brachial 104                                                        +---------+------------------+-----+----------+-------------------------+ PTA      31                0.28 monophasicdampened                  +---------+------------------+-----+----------+-------------------------+ DP        60                0.55 monophasicdampened                  +---------+------------------+-----+----------+-------------------------+ Great Toe                                 unable to obtain movement +---------+------------------+-----+----------+-------------------------+ +-------+-----------+-----------+------------+------------+ ABI/TBIToday's ABIToday's TBIPrevious ABIPrevious TBI +-------+-----------+-----------+------------+------------+ Right  BKA        -          -           -            +-------+-----------+-----------+------------+------------+ Left   0.55                  0.46        0.35         +-------+-----------+-----------+------------+------------+  Summary: Left: Resting left ankle-brachial index indicates moderate left lower extremity arterial disease. Unable to obtain left toe-brachial index due to movement.  *See table(s) above for measurements and observations.  Electronically signed by Servando Snare MD on 03/02/2019 at 4:31:21 PM.   Final      ASSESSMENT/PLAN:  This is a very pleasant 58 year old Caucasian male with stage IIIa non-small cell lung cancer, adenosquamous carcinoma.  He presented with a large right upper lobe lung mass with questionable chest wall invasion as well as a right hilar and mediastinal lymphadenopathy.  He was diagnosed in June 2019.  He completed 6 cycles of concurrent chemoradiation with carboplatin and paclitaxel.  He had a partial response to treatment. The patient is currently undergoing consolidation immunotherapy with Imfinzi 10 mg/kg IV every 2 weeks.  He is status post 18 cycles.  He continues to tolerate treatment well without any adverse effects.  The patient recently had a restaging CT scan performed.  Dr. Julien Nordmann personally and independently reviewed the scan and discussed the results with the patient today.  The scan showed stable disease. The scan also noted a slight increase in two nodules in the right lower  lobe. This could represent a progressive infectious or inflammatory etiology; however, lung cancer recurrence cannot be excluded. Dr. Julien Nordmann recommends continuing on the same treatment for now with close monitoring on subsequent imaging.   We recommend he proceed with cycle #19 of immunotherapy with Imfinzi today as scheduled.   I will see him back for a follow up visit in 2 weeks for evaluation prior to starting cycle #20.   His TSH is continuing to trend downwards since his last dose adjustment. We will continue to monitor closely and make adjustments as necessary.   The patient was advised to call immediately if he has any concerning symptoms in the interval. The patient voices understanding of current disease status and treatment options and is in agreement with the current care plan. All questions were answered. The patient knows to call the clinic with any problems, questions or concerns. We can certainly see the patient much sooner if necessary  No orders of the defined types were placed in this encounter.    Sokhna Christoph L Azizah Lisle, PA-C 03/14/19  ADDENDUM: Hematology/Oncology Attending: I had a face-to-face encounter with the patient today.  I recommended his care plan.  This is a very pleasant 58 years old white male with a stage IIIA non-small cell lung cancer, adenocarcinoma status post a course of concurrent chemoradiation with weekly carboplatin and paclitaxel with partial response.  The patient is currently undergoing consolidation treatment with Imfinzi status post 18 cycles and he has been tolerating this treatment well with no concerning adverse effects. He had repeat CT scan of the chest performed recently.  I personally and independently reviewed the scan images and discussed the result and showed the images to the patient today. His scan showed stable disease except for 2 pulmonary nodules in the right lung that need close observation on the upcoming imaging studies. I  recommended for the patient to continue his current treatment with Imfinzi and he will proceed with cycle #19 today. I will see him back for follow-up visit in 2 weeks for evaluation before the next cycle of his treatment. I strongly advised the patient to quit smoking. We will continue to monitor his TSH closely and adjust his thyroid medication as needed. He was advised to call immediately if he has any concerning symptoms in the interval.  Disclaimer: This note was dictated with voice recognition software. Similar sounding words can inadvertently be transcribed and may be missed upon review. Disclaimer: This note was dictated with voice recognition software. Similar sounding words can inadvertently be transcribed and may be missed upon review. Eilleen Kempf, MD 03/14/19

## 2019-03-15 ENCOUNTER — Telehealth: Payer: Self-pay | Admitting: Physician Assistant

## 2019-03-15 NOTE — Telephone Encounter (Signed)
Per 6/17 los, appts already scheduled.

## 2019-03-16 ENCOUNTER — Telehealth: Payer: Self-pay | Admitting: Physician Assistant

## 2019-03-16 NOTE — Telephone Encounter (Signed)
Spoke to the patient's wife regarding his most recent appointment. She wishes to be called from the room during his appointments going forward. The plan is to continue with 8 more treatments with Immunotherapy for a total of 26. We will continue to watch the nodules in his lungs on subsequent scans. All questions answered.

## 2019-03-16 NOTE — Progress Notes (Signed)
Remote ICD transmission.   

## 2019-03-22 ENCOUNTER — Other Ambulatory Visit: Payer: Self-pay | Admitting: Cardiovascular Disease

## 2019-03-22 DIAGNOSIS — J449 Chronic obstructive pulmonary disease, unspecified: Secondary | ICD-10-CM | POA: Diagnosis not present

## 2019-03-22 DIAGNOSIS — E063 Autoimmune thyroiditis: Secondary | ICD-10-CM | POA: Diagnosis not present

## 2019-03-22 DIAGNOSIS — Z1389 Encounter for screening for other disorder: Secondary | ICD-10-CM | POA: Diagnosis not present

## 2019-03-28 ENCOUNTER — Inpatient Hospital Stay: Payer: Medicare Other

## 2019-03-28 ENCOUNTER — Telehealth: Payer: Self-pay | Admitting: Internal Medicine

## 2019-03-28 ENCOUNTER — Inpatient Hospital Stay (HOSPITAL_BASED_OUTPATIENT_CLINIC_OR_DEPARTMENT_OTHER): Payer: Medicare Other | Admitting: Physician Assistant

## 2019-03-28 ENCOUNTER — Inpatient Hospital Stay: Payer: Medicare Other | Attending: Internal Medicine

## 2019-03-28 ENCOUNTER — Other Ambulatory Visit: Payer: Self-pay

## 2019-03-28 VITALS — BP 115/75 | HR 74 | Temp 98.7°F | Resp 18 | Ht 73.0 in | Wt 231.9 lb

## 2019-03-28 DIAGNOSIS — Z720 Tobacco use: Secondary | ICD-10-CM

## 2019-03-28 DIAGNOSIS — Z79899 Other long term (current) drug therapy: Secondary | ICD-10-CM | POA: Diagnosis not present

## 2019-03-28 DIAGNOSIS — Z5112 Encounter for antineoplastic immunotherapy: Secondary | ICD-10-CM | POA: Insufficient documentation

## 2019-03-28 DIAGNOSIS — C3411 Malignant neoplasm of upper lobe, right bronchus or lung: Secondary | ICD-10-CM

## 2019-03-28 DIAGNOSIS — K5900 Constipation, unspecified: Secondary | ICD-10-CM | POA: Diagnosis not present

## 2019-03-28 DIAGNOSIS — C3491 Malignant neoplasm of unspecified part of right bronchus or lung: Secondary | ICD-10-CM

## 2019-03-28 DIAGNOSIS — Z95828 Presence of other vascular implants and grafts: Secondary | ICD-10-CM

## 2019-03-28 LAB — CBC WITH DIFFERENTIAL (CANCER CENTER ONLY)
Abs Immature Granulocytes: 0.16 10*3/uL — ABNORMAL HIGH (ref 0.00–0.07)
Basophils Absolute: 0.1 10*3/uL (ref 0.0–0.1)
Basophils Relative: 1 %
Eosinophils Absolute: 0.4 10*3/uL (ref 0.0–0.5)
Eosinophils Relative: 5 %
HCT: 43.4 % (ref 39.0–52.0)
Hemoglobin: 14 g/dL (ref 13.0–17.0)
Immature Granulocytes: 2 %
Lymphocytes Relative: 8 %
Lymphs Abs: 0.7 10*3/uL (ref 0.7–4.0)
MCH: 31 pg (ref 26.0–34.0)
MCHC: 32.3 g/dL (ref 30.0–36.0)
MCV: 96.2 fL (ref 80.0–100.0)
Monocytes Absolute: 0.7 10*3/uL (ref 0.1–1.0)
Monocytes Relative: 8 %
Neutro Abs: 6.3 10*3/uL (ref 1.7–7.7)
Neutrophils Relative %: 76 %
Platelet Count: 195 10*3/uL (ref 150–400)
RBC: 4.51 MIL/uL (ref 4.22–5.81)
RDW: 13.3 % (ref 11.5–15.5)
WBC Count: 8.3 10*3/uL (ref 4.0–10.5)
nRBC: 0 % (ref 0.0–0.2)

## 2019-03-28 LAB — CMP (CANCER CENTER ONLY)
ALT: 41 U/L (ref 0–44)
AST: 31 U/L (ref 15–41)
Albumin: 4 g/dL (ref 3.5–5.0)
Alkaline Phosphatase: 140 U/L — ABNORMAL HIGH (ref 38–126)
Anion gap: 10 (ref 5–15)
BUN: 14 mg/dL (ref 6–20)
CO2: 27 mmol/L (ref 22–32)
Calcium: 8.6 mg/dL — ABNORMAL LOW (ref 8.9–10.3)
Chloride: 99 mmol/L (ref 98–111)
Creatinine: 1.22 mg/dL (ref 0.61–1.24)
GFR, Est AFR Am: >60 mL/min (ref >60)
GFR, Estimated: >60 mL/min (ref >60)
Glucose, Bld: 98 mg/dL (ref 70–99)
Potassium: 4.3 mmol/L (ref 3.5–5.1)
Sodium: 136 mmol/L (ref 135–145)
Total Bilirubin: 0.3 mg/dL (ref 0.3–1.2)
Total Protein: 7.2 g/dL (ref 6.5–8.1)

## 2019-03-28 MED ORDER — SODIUM CHLORIDE 0.9% FLUSH
10.0000 mL | Freq: Once | INTRAVENOUS | Status: AC
  Administered 2019-03-28: 10 mL
  Filled 2019-03-28: qty 10

## 2019-03-28 MED ORDER — SODIUM CHLORIDE 0.9 % IV SOLN
Freq: Once | INTRAVENOUS | Status: AC
  Administered 2019-03-28: 10:00:00 via INTRAVENOUS
  Filled 2019-03-28: qty 250

## 2019-03-28 MED ORDER — HEPARIN SOD (PORK) LOCK FLUSH 100 UNIT/ML IV SOLN
500.0000 [IU] | Freq: Once | INTRAVENOUS | Status: AC | PRN
  Administered 2019-03-28: 500 [IU]
  Filled 2019-03-28: qty 5

## 2019-03-28 MED ORDER — SODIUM CHLORIDE 0.9 % IV SOLN
9.6000 mg/kg | Freq: Once | INTRAVENOUS | Status: AC
  Administered 2019-03-28: 1000 mg via INTRAVENOUS
  Filled 2019-03-28: qty 20

## 2019-03-28 MED ORDER — SODIUM CHLORIDE 0.9% FLUSH
10.0000 mL | INTRAVENOUS | Status: DC | PRN
  Administered 2019-03-28: 10 mL
  Filled 2019-03-28: qty 10

## 2019-03-28 NOTE — Progress Notes (Signed)
[facility] OFFICE PROGRESS NOTE  Redmond School, MD [address] [place] [address]  DIAGNOSIS: Stage IIIA (T3, N2, M0) non-small cell lung cancer, adenosquamous carcinoma diagnosed in June 2019 and presented with large right upper lobe lung mass with questionable chest wall invasion as well as right hilar and mediastinal lymphadenopathy.  Biomarker Findings Tumor Mutational Burden - TMB-High (21 Muts/Mb) Microsatellite status - MS-Stable Genomic Findings For a complete list of the genes assayed, please refer to the Appendix. ATM K2409* CCND2 P281R KRAS G12V CHEK2 T31f*15 TP53 E298* 7 Disease relevant genes with no reportable alterations: ALK, EGFR, BRAF, MET, RET, ERBB2, ROS1  PRIOR THERAPY: Concurrent chemoradiation with chemotherapy consisting of weekly carboplatin for an AUC of 2 and paclitaxel 45 mg/m2. First dose given on 04/03/2018.Status post 6 cycles. Last dose was giving 05/15/2018 with partial response  CURRENT THERAPY: Consolidation treatment with immunotherapy with Imfinzi (Durvalumab) 10 mg/KG every 2 weeks. First dose 06/20/2018. Status post 19cycles  INTERVAL HISTORY: SLAINE GIOVANETTI58y.o. male returns to the clinic for a follow-up visit.  The patient is feeling well today without any concerning complaints.  He continues to tolerate his treatment with immunotherapy with Imfinzi well without any adverse effects.  He denies any fever, chills, night sweats, or weight loss.  He denies any chest pain, shortness of breath, cough, or hemoptysis. He unfortunately continues to smoke cigarettes. He denies any nausea, vomiting,or diarrhea. He reports his baseline constipation for which he takes over-the-counter stool softener.  He denies any headaches or visual changes.  He denies any rashes or skin changes.  The patient is here today for evaluation before starting cycle #20.  MEDICAL HISTORY: Past Medical History:  Diagnosis Date  . Acute hepatitis C  virus infection 06/19/2007   Qualifier: Diagnosis of  By: MLeilani MerlCMA, Tiffany    . Acute ST elevation myocardial infarction (STEMI) involving left anterior descending (LAD) coronary artery ([facility] 05/26/2016  . Acute ST elevation myocardial infarction (STEMI) of lateral wall ([facility] 05/26/2016  . 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 Anxiety   . Asthma   . Atherosclerosis of native arteries of the extremities with intermittent claudication 12/16/2011  . Chronic hepatitis C without hepatic coma ([facility] 05/03/2014  . COPD with chronic bronchitis and emphysema ([facility] 03/13/2018   FEV1 57%  . Depression   . DVT (deep venous thrombosis) ([facility] 2009; 2019   RLE; LLE  . Encounter for antineoplastic chemotherapy 03/22/2018  . Hepatitis C    "tx'd in 2015"  . High cholesterol   . Ischemic cardiomyopathy 03/02/2018  . Non-small cell carcinoma of right lung, stage 3 ([facility] 03/22/2018  . PAD (peripheral artery disease) ([facility] 01/25/2013  . Peripheral vascular disease, unspecified 07/20/2012  . Port-A-Cath in place 04/24/2018  . ST elevation myocardial infarction involving left anterior descending (LAD) coronary artery ([facility]   . STEMI (ST elevation myocardial infarction) ([facility] 05/26/2016  . Tobacco abuse 01/28/2016  . Tubular adenoma of colon 07/11/2014  . Urinary dribbling     ALLERGIES:  is allergic to lyrica [pregabalin] and neurontin [gabapentin].  MEDICATIONS:  Current Outpatient Medications  Medication Sig Dispense Refill  . alprazolam (XANAX) 2 MG tablet Take 2 mg by mouth 4 (four) times daily as needed for anxiety.     .Marland Kitchenamitriptyline (ELAVIL) 100 MG tablet Take 100 mg by mouth at bedtime.     .Marland Kitchenaspirin 81 MG EC tablet Take 81 mg by mouth daily.      .Marland Kitchenatorvastatin (  LIPITOR) 80 MG tablet Take 1 tablet (80 mg total) by mouth daily at 6 PM. 30 tablet 12  . carvedilol (COREG) 6.25 MG tablet TAKE 1 TABLET BY MOUTH TWICE A DAY WITH A MEAL 60 tablet 8  . citalopram (CELEXA) 40 MG tablet  Take 40 mg by mouth daily.  11  . furosemide (LASIX) 20 MG tablet Take 1 tablet (20 mg total) by mouth daily. 90 tablet 3  . HYDROcodone-acetaminophen (NORCO) 10-325 MG tablet Take 1 tablet by mouth every 4 (four) hours as needed for moderate pain.   0  . levETIRAcetam (KEPPRA) 750 MG tablet Take 1 tablet (750 mg total) by mouth 2 (two) times daily. 60 tablet 5  . levothyroxine (SYNTHROID) 125 MCG tablet TAKE 1 TABLET BY MOUTH EVERY DAY BEFORE BREAKFAST 30 tablet 0  . lidocaine-prilocaine (EMLA) cream Apply 1 application topically as needed. 30 g 0  . lisinopril (PRINIVIL,ZESTRIL) 2.5 MG tablet Take 1 tablet (2.5 mg total) by mouth daily. 30 tablet 12  . nitroGLYCERIN (NITROSTAT) 0.4 MG SL tablet Place 1 tablet (0.4 mg total) under the tongue every 5 (five) minutes x 3 doses as needed for chest pain. (Patient not taking: Reported on 03/14/2019) 25 tablet 2  . prochlorperazine (COMPAZINE) 10 MG tablet Take 1 tablet (10 mg total) by mouth every 6 (six) hours as needed for nausea or vomiting. 30 tablet 0  . spironolactone (ALDACTONE) 25 MG tablet TAKE 1 TABLET BY MOUTH EVERY DAY 90 tablet 0  . sucralfate (CARAFATE) 1 g tablet Take 1 tablet (1 g total) by mouth 4 (four) times daily -  with meals and at bedtime. 5 min before meals for radiation induced esophagitis 120 tablet 2  . tamsulosin (FLOMAX) 0.4 MG CAPS capsule Take 1 capsule by mouth daily.    . Tiotropium Bromide-Olodaterol (STIOLTO RESPIMAT) 2.5-2.5 MCG/ACT AERS Inhale 2 puffs into the lungs daily. 1 Inhaler 5  . umeclidinium-vilanterol (ANORO ELLIPTA) 62.5-25 MCG/INH AEPB Inhale 1 puff into the lungs daily. 1 each 0   No current facility-administered medications for this visit.     SURGICAL HISTORY:  Past Surgical History:  Procedure Laterality Date  . AORTOGRAM  08/08/2009   for LLE claudication     By Dr. Oneida Alar  . 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 BIOPSY N/A 05/30/2014   Procedure: BIOPSY;  Surgeon: [name], MD;   Location: [facility];  Service: Endoscopy;  Laterality: N/A;  . BLADDER TUMOR EXCISION  8/812  . CARDIAC CATHETERIZATION N/A 05/26/2016   Procedure: Left Heart Cath and Coronary Angiography;  Surgeon: [name], MD;  Location: [facility];  Service: Cardiovascular;  Laterality: N/A;  . CARDIAC CATHETERIZATION N/A 05/26/2016   Procedure: Coronary Stent Intervention;  Surgeon: [name], MD;  Location: [facility];  Service: Cardiovascular;  Laterality: N/A;  . COLONOSCOPY WITH PROPOFOL N/A 05/30/2014   WPY:KDXIPJ colonic polyp-likely source of hematochezia-removed as described above  . ESOPHAGOGASTRODUODENOSCOPY (EGD) WITH PROPOFOL N/A 05/30/2014   ASN:KNLZJQ and bulbar erosions s/p gastric biopsy. No evidence of portal gastropathy on today's examination.  . FEMORAL-TIBIAL BYPASS GRAFT  2009   Right side using non-reversed GSV   By Dr. Oneida Alar  . FEMORAL-TIBIAL BYPASS GRAFT  11/24/2007   Right femoral to anterior tibial BPG   by Dr. Oneida Alar  . FINGER SURGERY Left    "straightened my pinky"  . HAND TENDON SURGERY Left 2013   Left 5th finger  . HERNIA REPAIR     "  stomach"  . ICD IMPLANT N/A 03/02/2018   Procedure: ICD IMPLANT;  Surgeon: [name], MD;  Location: [facility];  Service: Cardiovascular;  Laterality: N/A;  . INCISIONAL HERNIA REPAIR N/A 12/25/2014   Procedure: HERNIA REPAIR INCISIONAL WITH MESH;  Surgeon: [name], MD;  Location: [facility];  Service: General;  Laterality: N/A;  . INSERTION OF MESH N/A 12/25/2014   Procedure: INSERTION OF MESH;  Surgeon: [name], MD;  Location: [facility];  Service: General;  Laterality: N/A;  . IR IMAGING GUIDED PORT INSERTION  04/11/2018  . LAPAROSCOPIC CHOLECYSTECTOMY    . LOWER EXTREMITY ANGIOGRAM Left 12/30/2015   Procedure: Lower Extremity Angiogram;  Surgeon: [name], MD;  Location: [facility];  Service: Cardiovascular;  Laterality: Left;  . PERIPHERAL VASCULAR CATHETERIZATION N/A 12/30/2015    Procedure: Abdominal Aortogram;  Surgeon: [name], MD;  Location: [facility];  Service: Cardiovascular;  Laterality: N/A;  . POLYPECTOMY  05/30/2014   Procedure: POLYPECTOMY;  Surgeon: [name], MD;  Location: [facility];  Service: Endoscopy;;  . TONSILLECTOMY AND ADENOIDECTOMY  ~ 1970  . TYMPANOSTOMY TUBE PLACEMENT Bilateral ~ 1970    REVIEW OF SYSTEMS:   Review of Systems  Constitutional: Negative for appetite change, chills, fatigue, fever and unexpected weight change.  HENT:   Negative for mouth sores, nosebleeds, sore throat and trouble swallowing.   Eyes: Negative for eye problems and icterus.  Respiratory: Negative for cough, hemoptysis, shortness of breath and wheezing.   Cardiovascular: Negative for chest pain and leg swelling.  Gastrointestinal: Positive for baseline constipation. Negative for abdominal pain,  diarrhea, nausea and vomiting.  Genitourinary: Negative for bladder incontinence, difficulty urinating, dysuria, frequency and hematuria.   Musculoskeletal: Negative for back pain, gait problem, neck pain and neck stiffness.  Skin: Negative for itching and rash.  Neurological: Negative for dizziness, extremity weakness, gait problem, headaches, light-headedness and seizures.  Hematological: Negative for adenopathy. Does not bruise/bleed easily.  Psychiatric/Behavioral: Negative for confusion, depression and sleep disturbance. The patient is not nervous/anxious.     PHYSICAL EXAMINATION:  Blood pressure 115/75, pulse 74, temperature 98.7 F (37.1 C), temperature source Oral, resp. rate 18, height '6\' 1"'$  (1.854 m), weight 231 lb 14.4 oz (105.2 kg), SpO2 98 %.  ECOG PERFORMANCE STATUS: 1 - Symptomatic but completely ambulatory  Physical Exam  Constitutional: Oriented to person, place, and time and well-developed, well-nourished, and in no distress.  HENT:  Head: Normocephalic and atraumatic.  Mouth/Throat: Oropharynx is clear and moist. No  oropharyngeal exudate.  Eyes: Conjunctivae are normal. Right eye exhibits no discharge. Left eye exhibits no discharge. No scleral icterus.  Neck: Normal range of motion. Neck supple.  Cardiovascular: Normal rate, regular rhythm, normal heart sounds and intact distal pulses.   Pulmonary/Chest: Diffuse wheezing in all lung fields. Effort normal and breath sounds normal. No respiratory distress. No rales.  Abdominal: Soft. Bowel sounds are normal. Exhibits no distension and no mass. There is no tenderness.  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 Normal range of motion. Exhibits no edema.  Lymphadenopathy:    No cervical adenopathy.  Neurological: Alert and oriented to person, place, and time. Exhibits normal muscle tone. Gait normal. Coordination normal.  Skin: Skin is warm and dry. No rash noted. Not diaphoretic. No erythema. No pallor.  Psychiatric: Mood, memory and judgment normal.  Vitals reviewed.  LABORATORY DATA: Lab Results  Component Value Date   WBC 8.3 03/28/2019  HGB 14.0 03/28/2019   HCT 43.4 03/28/2019   MCV 96.2 03/28/2019   PLT 195 03/28/2019      Chemistry      Component Value Date/Time   NA 136 03/28/2019 0752   NA 139 02/27/2018 1457   K 4.3 03/28/2019 0752   CL 99 03/28/2019 0752   CO2 27 03/28/2019 0752   BUN 14 03/28/2019 0752   BUN 7 02/27/2018 1457   CREATININE 1.22 03/28/2019 0752   CREATININE 1.13 06/28/2016 1453      Component Value Date/Time   CALCIUM 8.6 (L) 03/28/2019 0752   ALKPHOS 140 (H) 03/28/2019 0752   AST 31 03/28/2019 0752   ALT 41 03/28/2019 0752   BILITOT 0.3 03/28/2019 0752       RADIOGRAPHIC STUDIES:  Ct Chest W Contrast  Result Date: 03/10/2019 CLINICAL DATA:  Follow-up lung cancer.  No complaints EXAM: CT CHEST WITH CONTRAST TECHNIQUE: Multidetector CT imaging of the chest was performed during intravenous contrast administration. CONTRAST:  26m OMNIPAQUE IOHEXOL 300 MG/ML  SOLN COMPARISON:  CT 01/12/19 FINDINGS:  Cardiovascular: Coronary artery calcification and aortic atherosclerotic calcification. Stent in the LAD. Port in the anterior chest wall with tip in distal SVC. Pacer wires in the RIGHT heart. Mediastinum/Nodes: No axillary or supraclavicular adenopathy. No mediastinal hilar adenopathy no pericardial effusion. Esophagus normal Lungs/Pleura: Angular band of consolidation in the RIGHT upper lobe measuring 4.2 x 3.7 cm compares to 5.1 by 3.5 cm for no interval change. No new nodularity. There is pleural thickening in the RIGHT upper lobe. Two discrete pulmonary nodules in the superior segment of the RIGHT lower lobe which are increased in size: 8 mm nodule (image 51/7) compares to 6 mm 8 mm nodule on same image compares to 4 mm. Several smaller nodules in the same vicinity as well as the upper lobe are unchanged. LEFT lung is hyperexpanded. Small ground-glass nodule in the LEFT upper lobe on image 36/7 is unchanged. Nodule along the oblique fissure appears benign and unchanged. Upper Abdomen: Limited view of the liver, kidneys, pancreas are unremarkable. Normal adrenal glands. Musculoskeletal: No aggressive osseous lesion IMPRESSION: 1. Interval increase in size of two nodules in the superior segment RIGHT lower lobe. Differential includes progressive infectious / inflammatory process versus lung cancer recurrence. Interval growth is certainly concerning for lung cancer. 2. Scattered additional nodules in the RIGHT upper lobe and RIGHT lower lobe are stable. 3. Stable post radiation fibrotic mass in the RIGHT upper lobe. Electronically Signed   By: SSuzy BouchardM.D.   On: 03/10/2019 15:31   Vas UK[place]ABurnard BuntingWith/wo Tbi  Result Date: 03/02/2019 LOWER EXTREMITY DOPPLER STUDY Indications: Peripheral artery disease. High Risk Factors: Current smoker.  Vascular Interventions: Left CIA stent. Right EIA occlusion. Left SFA/POP                         occlusion. Right BKA. Performing Technologist: SAlvia GroveRVT   Examination Guidelines: A complete evaluation includes at minimum, Doppler waveform signals and systolic blood pressure reading at the level of bilateral brachial, anterior tibial, and posterior tibial arteries, when vessel segments are accessible. Bilateral testing is considered an integral part of a complete examination. Photoelectric Plethysmograph (PPG) waveforms and toe systolic pressure readings are included as required and additional duplex testing as needed. Limited examinations for reoccurring indications may be performed as noted.  ABI Findings: +--------+------------------+-----+--------+--------+ Right   Rt Pressure (mmHg)IndexWaveformComment  +--------+------------------+-----+--------+--------+ [id number]                                    +--------+------------------+-----+--------+--------+ +---------+------------------+-----+----------+-------------------------+  Left     Lt Pressure (mmHg)IndexWaveform  Comment                   +---------+------------------+-----+----------+-------------------------+ Brachial 104                                                        +---------+------------------+-----+----------+-------------------------+ PTA      31                0.28 monophasicdampened                  +---------+------------------+-----+----------+-------------------------+ DP       60                0.55 monophasicdampened                  +---------+------------------+-----+----------+-------------------------+ Great Toe                                 unable to obtain movement +---------+------------------+-----+----------+-------------------------+ +-------+-----------+-----------+------------+------------+ ABI/TBIToday's ABIToday's TBIPrevious ABIPrevious TBI +-------+-----------+-----------+------------+------------+ Right  BKA        -          -           -            +-------+-----------+-----------+------------+------------+  Left   0.55                  0.46        0.35         +-------+-----------+-----------+------------+------------+  Summary: Left: Resting left ankle-brachial index indicates moderate left lower extremity arterial disease. Unable to obtain left toe-brachial index due to movement.  *See table(s) above for measurements and observations.  Electronically signed by Servando Snare MD on 03/02/2019 at 4:31:21 PM.   Final      ASSESSMENT/PLAN:  This is a very pleasant 58 year old Caucasian male with stage IIIa non-small cell lung cancer, adenosquamous carcinoma.  He presented with a large right upper lobe lung mass with questionable chest wall invasion as well as a right hilar and mediastinal lymphadenopathy.  He was diagnosed in June 2019.  He completed 6 cycles of concurrent chemoradiation with carboplatin and paclitaxel.  He had a partial response to treatment.  The patient is currently undergoing consolidation immunotherapy with Imfinzi 10 mg/kg IV every 2 weeks.  He is status post 19 cycles.  He is tolerating treatment well without any adverse side effects.  Labs were reviewed with the patient today.  I recommend that he proceed with cycle #20 today as scheduled.  I will see the patient back for follow-up visit in 2 weeks for evaluation before starting cycle #21.  The patient was strongly encouraged to quit smoking. The patient is not interested in smoking cessation at this time.   He will continue taking his stool softener for constipation.   The patient was advised to call immediately if he has any concerning symptoms in the interval. The patient voices understanding of current disease status and treatment options and is in agreement with the current care plan. All questions were answered. The patient knows to call the clinic with any problems, questions or concerns. We can certainly see the patient much sooner if necessary  No orders of the defined types  were placed in this encounter.     Lamoyne Palencia L Shevelle Smither, PA-C 03/28/19

## 2019-03-28 NOTE — Telephone Encounter (Signed)
Scheduled appt per 7/1 los - pt to get an updated schedule in treatment area.

## 2019-03-28 NOTE — Patient Instructions (Signed)
[facility] Discharge Instructions for Patients Receiving Chemotherapy  Today you received the following Immunotherapy: Imfinzi  To help prevent nausea and vomiting after your treatment, we encourage you to take your nausea medication as directed by your MD.   If you develop nausea and vomiting that is not controlled by your nausea medication, call the clinic.   BELOW ARE SYMPTOMS THAT SHOULD BE REPORTED IMMEDIATELY:  *FEVER GREATER THAN 100.5 F  *CHILLS WITH OR WITHOUT FEVER  NAUSEA AND VOMITING THAT IS NOT CONTROLLED WITH YOUR NAUSEA MEDICATION  *UNUSUAL SHORTNESS OF BREATH  *UNUSUAL BRUISING OR BLEEDING  TENDERNESS IN MOUTH AND THROAT WITH OR WITHOUT PRESENCE OF ULCERS  *URINARY PROBLEMS  *BOWEL PROBLEMS  UNUSUAL RASH Items with * indicate a potential emergency and should be followed up as soon as possible.  Feel free to call the clinic should you have any questions or concerns. The clinic phone number is [phone].  Please show the [facility] at check-in to the Emergency Department and triage nurse. Coronavirus (COVID-19) Are you at risk?  Are you at risk for the Coronavirus (COVID-19)?  To be considered HIGH RISK for Coronavirus (COVID-19), you have to meet the following criteria:  . Traveled to [place], [place], [place], [place] or [place]; or in the [place] to [place], [place], [place], or [place]; and have fever, cough, and shortness of breath within the last 2 weeks of travel OR . Been in close contact with a person diagnosed with COVID-19 within the last 2 weeks and have fever, cough, and shortness of breath . IF YOU DO NOT MEET THESE CRITERIA, YOU ARE CONSIDERED LOW RISK FOR COVID-19.  What to do if you are HIGH RISK for COVID-19?  [name] If you are having a medical emergency, call 911. . Seek medical care right away. Before you go to a doctor's office, urgent care or emergency department, call ahead and tell them about your  recent travel, contact with someone diagnosed with COVID-19, and your symptoms. You should receive instructions from your physician's office regarding next steps of care.  . When you arrive at healthcare provider, tell the healthcare staff immediately you have returned from visiting [place], [place], [place], [place] or [place]; or traveled in the [place] to [place], [place], [place], or [place]; in the last two weeks or you have been in close contact with a person diagnosed with COVID-19 in the last 2 weeks.   . Tell the health care staff about your symptoms: fever, cough and shortness of breath. . After you have been seen by a medical provider, you will be either: o Tested for (COVID-19) and discharged home on quarantine except to seek medical care if symptoms worsen, and asked to  - Stay home and avoid contact with others until you get your results (4-5 days)  - Avoid travel on public transportation if possible (such as bus, train, or airplane) or o Sent to the Emergency Department by EMS for evaluation, COVID-19 testing, and possible admission depending on your condition and test results.  What to do if you are LOW RISK for COVID-19?  Reduce your risk of any infection by using the same precautions used for avoiding the common cold or flu:  [name] Wash your hands often with soap and warm water for at least 20 seconds.  If soap and water are not readily available, use an alcohol-based hand sanitizer with at least 60% alcohol.  . If coughing or  sneezing, cover your mouth and nose by coughing or sneezing into the elbow areas of your shirt or coat, into a tissue or into your sleeve (not your hands). . Avoid shaking hands with others and consider head nods or verbal greetings only. . Avoid touching your eyes, nose, or mouth with unwashed hands.  . Avoid close contact with people who are sick. . Avoid places or events with large numbers of people in one location, like concerts or sporting  events. . Carefully consider travel plans you have or are making. . If you are planning any travel outside or inside the [place], visit the CDC's Travelers' Health webpage for the latest health notices. . If you have some symptoms but not all symptoms, continue to monitor at home and seek medical attention if your symptoms worsen. . If you are having a medical emergency, call 911.   [facility] / e-Visit: [email or web address]         [facility]: [facility]: [phone]                   [facility]: [phone]

## 2019-04-03 ENCOUNTER — Other Ambulatory Visit: Payer: Self-pay | Admitting: *Deleted

## 2019-04-03 ENCOUNTER — Ambulatory Visit: Payer: Medicare Other | Admitting: Internal Medicine

## 2019-04-11 ENCOUNTER — Inpatient Hospital Stay: Payer: Medicare Other

## 2019-04-11 ENCOUNTER — Inpatient Hospital Stay (HOSPITAL_BASED_OUTPATIENT_CLINIC_OR_DEPARTMENT_OTHER): Payer: Medicare Other | Admitting: Internal Medicine

## 2019-04-11 ENCOUNTER — Other Ambulatory Visit: Payer: Self-pay | Admitting: Internal Medicine

## 2019-04-11 ENCOUNTER — Encounter: Payer: Self-pay | Admitting: Internal Medicine

## 2019-04-11 ENCOUNTER — Telehealth: Payer: Self-pay | Admitting: *Deleted

## 2019-04-11 ENCOUNTER — Other Ambulatory Visit: Payer: Self-pay

## 2019-04-11 VITALS — BP 91/56 | HR 63 | Temp 98.5°F | Resp 18 | Ht 73.0 in | Wt 232.0 lb

## 2019-04-11 DIAGNOSIS — C3491 Malignant neoplasm of unspecified part of right bronchus or lung: Secondary | ICD-10-CM

## 2019-04-11 DIAGNOSIS — C3411 Malignant neoplasm of upper lobe, right bronchus or lung: Secondary | ICD-10-CM | POA: Diagnosis not present

## 2019-04-11 DIAGNOSIS — Z95828 Presence of other vascular implants and grafts: Secondary | ICD-10-CM

## 2019-04-11 DIAGNOSIS — Z9181 History of falling: Secondary | ICD-10-CM

## 2019-04-11 DIAGNOSIS — Z79899 Other long term (current) drug therapy: Secondary | ICD-10-CM | POA: Diagnosis not present

## 2019-04-11 DIAGNOSIS — Z5112 Encounter for antineoplastic immunotherapy: Secondary | ICD-10-CM | POA: Diagnosis not present

## 2019-04-11 LAB — CBC WITH DIFFERENTIAL (CANCER CENTER ONLY)
Abs Immature Granulocytes: 0.1 10*3/uL — ABNORMAL HIGH (ref 0.00–0.07)
Basophils Absolute: 0.1 10*3/uL (ref 0.0–0.1)
Basophils Relative: 1 %
Eosinophils Absolute: 0.5 10*3/uL (ref 0.0–0.5)
Eosinophils Relative: 6 %
HCT: 43.7 % (ref 39.0–52.0)
Hemoglobin: 14 g/dL (ref 13.0–17.0)
Immature Granulocytes: 1 %
Lymphocytes Relative: 10 %
Lymphs Abs: 0.8 10*3/uL (ref 0.7–4.0)
MCH: 30.7 pg (ref 26.0–34.0)
MCHC: 32 g/dL (ref 30.0–36.0)
MCV: 95.8 fL (ref 80.0–100.0)
Monocytes Absolute: 0.5 10*3/uL (ref 0.1–1.0)
Monocytes Relative: 7 %
Neutro Abs: 6.1 10*3/uL (ref 1.7–7.7)
Neutrophils Relative %: 75 %
Platelet Count: 248 10*3/uL (ref 150–400)
RBC: 4.56 MIL/uL (ref 4.22–5.81)
RDW: 13.6 % (ref 11.5–15.5)
WBC Count: 8.1 10*3/uL (ref 4.0–10.5)
nRBC: 0 % (ref 0.0–0.2)

## 2019-04-11 LAB — CMP (CANCER CENTER ONLY)
ALT: 27 U/L (ref 0–44)
AST: 27 U/L (ref 15–41)
Albumin: 4.1 g/dL (ref 3.5–5.0)
Alkaline Phosphatase: 124 U/L (ref 38–126)
Anion gap: 12 (ref 5–15)
BUN: 13 mg/dL (ref 6–20)
CO2: 28 mmol/L (ref 22–32)
Calcium: 8.8 mg/dL — ABNORMAL LOW (ref 8.9–10.3)
Chloride: 97 mmol/L — ABNORMAL LOW (ref 98–111)
Creatinine: 1.21 mg/dL (ref 0.61–1.24)
GFR, Est AFR Am: >60 mL/min (ref >60)
GFR, Estimated: >60 mL/min (ref >60)
Glucose, Bld: 105 mg/dL — ABNORMAL HIGH (ref 70–99)
Potassium: 4.5 mmol/L (ref 3.5–5.1)
Sodium: 137 mmol/L (ref 135–145)
Total Bilirubin: 0.3 mg/dL (ref 0.3–1.2)
Total Protein: 7.5 g/dL (ref 6.5–8.1)

## 2019-04-11 LAB — TSH: TSH: 74.086 u[IU]/mL — ABNORMAL HIGH (ref 0.320–4.118)

## 2019-04-11 MED ORDER — SODIUM CHLORIDE 0.9 % IV SOLN
9.7000 mg/kg | Freq: Once | INTRAVENOUS | Status: AC
  Administered 2019-04-11: 1000 mg via INTRAVENOUS
  Filled 2019-04-11: qty 20

## 2019-04-11 MED ORDER — SODIUM CHLORIDE 0.9% FLUSH
10.0000 mL | INTRAVENOUS | Status: DC | PRN
  Administered 2019-04-11: 10 mL
  Filled 2019-04-11: qty 10

## 2019-04-11 MED ORDER — SODIUM CHLORIDE 0.9% FLUSH
10.0000 mL | Freq: Once | INTRAVENOUS | Status: AC
  Administered 2019-04-11: 10 mL
  Filled 2019-04-11: qty 10

## 2019-04-11 MED ORDER — HEPARIN SOD (PORK) LOCK FLUSH 100 UNIT/ML IV SOLN
500.0000 [IU] | Freq: Once | INTRAVENOUS | Status: AC | PRN
  Administered 2019-04-11: 500 [IU]
  Filled 2019-04-11: qty 5

## 2019-04-11 MED ORDER — SODIUM CHLORIDE 0.9 % IV SOLN
Freq: Once | INTRAVENOUS | Status: AC
  Administered 2019-04-11: 11:00:00 via INTRAVENOUS
  Filled 2019-04-11: qty 250

## 2019-04-11 NOTE — Telephone Encounter (Signed)
Called pt regarding TSH level, lmovm for pt mobile and home# to call office to confirm if he is taking levothyroxine.

## 2019-04-11 NOTE — Telephone Encounter (Signed)
Attempt to reach pt, s/w Pt wife states pt is taking Levothyroxine 64mcg 1 tablet daily. This Rx was from pt's PCP Dr. Gerarda Fraction in April 2020. WIll review with MD and advised wife to expect a call from our office with additional instructions. Message routed to provider.

## 2019-04-11 NOTE — Patient Instructions (Signed)
[facility] Discharge Instructions for Patients Receiving Chemotherapy  Today you received the following Immunotherapy: Imfinzi  To help prevent nausea and vomiting after your treatment, we encourage you to take your nausea medication as directed by your MD.   If you develop nausea and vomiting that is not controlled by your nausea medication, call the clinic.   BELOW ARE SYMPTOMS THAT SHOULD BE REPORTED IMMEDIATELY:  *FEVER GREATER THAN 100.5 F  *CHILLS WITH OR WITHOUT FEVER  NAUSEA AND VOMITING THAT IS NOT CONTROLLED WITH YOUR NAUSEA MEDICATION  *UNUSUAL SHORTNESS OF BREATH  *UNUSUAL BRUISING OR BLEEDING  TENDERNESS IN MOUTH AND THROAT WITH OR WITHOUT PRESENCE OF ULCERS  *URINARY PROBLEMS  *BOWEL PROBLEMS  UNUSUAL RASH Items with * indicate a potential emergency and should be followed up as soon as possible.  Feel free to call the clinic should you have any questions or concerns. The clinic phone number is [phone].  Please show the [facility] at check-in to the Emergency Department and triage nurse. Coronavirus (COVID-19) Are you at risk?  Are you at risk for the Coronavirus (COVID-19)?  To be considered HIGH RISK for Coronavirus (COVID-19), you have to meet the following criteria:  . Traveled to [place], [place], [place], [place] or [place]; or in the [place] to [place], [place], [place], or [place]; and have fever, cough, and shortness of breath within the last 2 weeks of travel OR . Been in close contact with a person diagnosed with COVID-19 within the last 2 weeks and have fever, cough, and shortness of breath . IF YOU DO NOT MEET THESE CRITERIA, YOU ARE CONSIDERED LOW RISK FOR COVID-19.  What to do if you are HIGH RISK for COVID-19?  [name] If you are having a medical emergency, call 911. . Seek medical care right away. Before you go to a doctor's office, urgent care or emergency department, call ahead and tell them about your  recent travel, contact with someone diagnosed with COVID-19, and your symptoms. You should receive instructions from your physician's office regarding next steps of care.  . When you arrive at healthcare provider, tell the healthcare staff immediately you have returned from visiting [place], [place], [place], [place] or [place]; or traveled in the [place] to [place], [place], [employer], or [place]; in the last two weeks or you have been in close contact with a person diagnosed with COVID-19 in the last 2 weeks.   . Tell the health care staff about your symptoms: fever, cough and shortness of breath. . After you have been seen by a medical provider, you will be either: o Tested for (COVID-19) and discharged home on quarantine except to seek medical care if symptoms worsen, and asked to  - Stay home and avoid contact with others until you get your results (4-5 days)  - Avoid travel on public transportation if possible (such as bus, train, or airplane) or o Sent to the Emergency Department by EMS for evaluation, COVID-19 testing, and possible admission depending on your condition and test results.  What to do if you are LOW RISK for COVID-19?  Reduce your risk of any infection by using the same precautions used for avoiding the common cold or flu:  [name] Wash your hands often with soap and warm water for at least 20 seconds.  If soap and water are not readily available, use an alcohol-based hand sanitizer with at least 60% alcohol.  . If coughing or  sneezing, cover your mouth and nose by coughing or sneezing into the elbow areas of your shirt or coat, into a tissue or into your sleeve (not your hands). . Avoid shaking hands with others and consider head nods or verbal greetings only. . Avoid touching your eyes, nose, or mouth with unwashed hands.  . Avoid close contact with people who are sick. . Avoid places or events with large numbers of people in one location, like concerts or sporting  events. . Carefully consider travel plans you have or are making. . If you are planning any travel outside or inside the [place], visit the CDC's Travelers' Health webpage for the latest health notices. . If you have some symptoms but not all symptoms, continue to monitor at home and seek medical attention if your symptoms worsen. . If you are having a medical emergency, call 911.   [facility] [facility] / e-Visit: [email or web address]         [facility]: [facility]: [phone]                   [facility]: [phone]

## 2019-04-11 NOTE — Progress Notes (Signed)
[facility] [phone]   [phone]  OFFICE PROGRESS NOTE  [place], [place] [id number]  DIAGNOSIS:stage IIIA (T3, N2, M0) non-small cell lung cancer, adenosquamous carcinoma diagnosed in June 2019 and presented with large right upper lobe lung mass with questionable chest wall invasion as well as right hilar and mediastinal lymphadenopathy.  Biomarker Findings Tumor Mutational Burden - TMB-High (21 Muts/Mb) Microsatellite status - MS-Stable Genomic Findings For a complete list of the genes assayed, please refer to the Appendix. ATM C1660* CCND2 P281R KRAS G12V CHEK2 T331f*15 TP53 E298* 7 Disease relevant genes with no reportable alterations: ALK, EGFR, BRAF, MET, RET, ERBB2, ROS1  PRIOR THERAPY:Concurrent chemoradiation with chemotherapy consisting of weekly carboplatin for an AUC of 2 and paclitaxel 45 mg/m2. First dose given on 04/03/2018.  Status post 6 cycles.  Last dose was giving 05/15/2018 with partial response.  CURRENT THERAPY:Consolidation treatment with immunotherapy with Imfinzi (Durvalumab) 10 mg/KG every 2 weeks.  First dose 06/20/2018.  Status post 20 cycles.  INTERVAL HISTORY: Mark GLOSSER58y.o. male returns to the clinic today for follow-up visit.  The patient is feeling fine today with no concerning complaints except for mild bruises on his arms after a fall.  He denied having any chest pain, shortness of breath, cough or hemoptysis.  He denied having any fever or chills.  He has no nausea, vomiting, diarrhea or constipation.  He has no headache or visual changes.  He is here today for evaluation before starting cycle #21.  MEDICAL HISTORY: Past Medical History:  Diagnosis Date  . Acute hepatitis C virus infection 06/19/2007   Qualifier: Diagnosis of  By: MLeilani MerlCMA, Tiffany    . Acute ST elevation myocardial infarction (STEMI) involving left anterior descending (LAD) coronary artery  ([facility] 05/26/2016  . Acute ST elevation myocardial infarction (STEMI) of lateral wall ([facility] 05/26/2016  . 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 Anxiety   . Asthma   . Atherosclerosis of native arteries of the extremities with intermittent claudication 12/16/2011  . Chronic hepatitis C without hepatic coma ([facility] 05/03/2014  . COPD with chronic bronchitis and emphysema ([facility] 03/13/2018   FEV1 57%  . Depression   . DVT (deep venous thrombosis) ([facility] 2009; 2019   RLE; LLE  . Encounter for antineoplastic chemotherapy 03/22/2018  . Hepatitis C    "tx'd in 2015"  . High cholesterol   . Ischemic cardiomyopathy 03/02/2018  . Non-small cell carcinoma of right lung, stage 3 ([facility] 03/22/2018  . PAD (peripheral artery disease) ([facility] 01/25/2013  . Peripheral vascular disease, unspecified 07/20/2012  . Port-A-Cath in place 04/24/2018  . ST elevation myocardial infarction involving left anterior descending (LAD) coronary artery ([facility]   . STEMI (ST elevation myocardial infarction) ([facility] 05/26/2016  . Tobacco abuse 01/28/2016  . Tubular adenoma of colon 07/11/2014  . Urinary dribbling     ALLERGIES:  is allergic to lyrica [pregabalin] and neurontin [gabapentin].  MEDICATIONS:  Current Outpatient Medications  Medication Sig Dispense Refill  . alprazolam (XANAX) 2 MG tablet Take 2 mg by mouth 4 (four) times daily as needed for anxiety.     .Marland Kitchenamitriptyline (ELAVIL) 100 MG tablet Take 100 mg by mouth at bedtime.     .Marland Kitchenaspirin 81 MG EC tablet Take 81 mg by mouth daily.      .Marland Kitchenatorvastatin (LIPITOR) 80 MG tablet Take 1 tablet (80 mg total) by mouth daily at 6 PM. 30 tablet  12  . carvedilol (COREG) 6.25 MG tablet TAKE 1 TABLET BY MOUTH TWICE A DAY WITH A MEAL 60 tablet 8  . citalopram (CELEXA) 40 MG tablet Take 40 mg by mouth daily.  11  . furosemide (LASIX) 20 MG tablet Take 1 tablet (20 mg total) by mouth daily. 90 tablet 3  . HYDROcodone-acetaminophen (NORCO) 10-325 MG tablet Take 1  tablet by mouth every 4 (four) hours as needed for moderate pain.   0  . levETIRAcetam (KEPPRA) 750 MG tablet Take 1 tablet (750 mg total) by mouth 2 (two) times daily. 60 tablet 5  . levothyroxine (SYNTHROID) 125 MCG tablet TAKE 1 TABLET BY MOUTH EVERY DAY BEFORE BREAKFAST 30 tablet 0  . lidocaine-prilocaine (EMLA) cream Apply 1 application topically as needed. 30 g 0  . lisinopril (PRINIVIL,ZESTRIL) 2.5 MG tablet Take 1 tablet (2.5 mg total) by mouth daily. 30 tablet 12  . nitroGLYCERIN (NITROSTAT) 0.4 MG SL tablet Place 1 tablet (0.4 mg total) under the tongue every 5 (five) minutes x 3 doses as needed for chest pain. (Patient not taking: Reported on 03/14/2019) 25 tablet 2  . prochlorperazine (COMPAZINE) 10 MG tablet Take 1 tablet (10 mg total) by mouth every 6 (six) hours as needed for nausea or vomiting. 30 tablet 0  . spironolactone (ALDACTONE) 25 MG tablet TAKE 1 TABLET BY MOUTH EVERY DAY 90 tablet 0  . sucralfate (CARAFATE) 1 g tablet Take 1 tablet (1 g total) by mouth 4 (four) times daily -  with meals and at bedtime. 5 min before meals for radiation induced esophagitis 120 tablet 2  . tamsulosin (FLOMAX) 0.4 MG CAPS capsule Take 1 capsule by mouth daily.    . Tiotropium Bromide-Olodaterol (STIOLTO RESPIMAT) 2.5-2.5 MCG/ACT AERS Inhale 2 puffs into the lungs daily. 1 Inhaler 5  . umeclidinium-vilanterol (ANORO ELLIPTA) 62.5-25 MCG/INH AEPB Inhale 1 puff into the lungs daily. 1 each 0   No current facility-administered medications for this visit.     SURGICAL HISTORY:  Past Surgical History:  Procedure Laterality Date  . AORTOGRAM  08/08/2009   for LLE claudication     By Dr. Oneida Alar  . 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 BIOPSY N/A 05/30/2014   Procedure: BIOPSY;  Surgeon: [name], MD;  Location: [facility];  Service: Endoscopy;  Laterality: N/A;  . BLADDER TUMOR EXCISION  8/812  . CARDIAC CATHETERIZATION N/A 05/26/2016   Procedure: Left Heart Cath and Coronary  Angiography;  Surgeon: [name], MD;  Location: [facility];  Service: Cardiovascular;  Laterality: N/A;  . CARDIAC CATHETERIZATION N/A 05/26/2016   Procedure: Coronary Stent Intervention;  Surgeon: [name], MD;  Location: [facility];  Service: Cardiovascular;  Laterality: N/A;  . COLONOSCOPY WITH PROPOFOL N/A 05/30/2014   INO:[facility] colonic polyp-likely source of hematochezia-removed as described above  . ESOPHAGOGASTRODUODENOSCOPY (EGD) WITH PROPOFOL N/A 05/30/2014   NOB:SJGGEZ and bulbar erosions s/p gastric biopsy. No evidence of portal gastropathy on today's examination.  . FEMORAL-TIBIAL BYPASS GRAFT  2009   Right side using non-reversed GSV   By Dr. Oneida Alar  . FEMORAL-TIBIAL BYPASS GRAFT  11/24/2007   Right femoral to anterior tibial BPG   by Dr. Oneida Alar  . FINGER SURGERY Left    "straightened my pinky"  . HAND TENDON SURGERY Left 2013   Left 5th finger  . HERNIA REPAIR     "stomach"  . ICD IMPLANT N/A 03/02/2018   Procedure: ICD IMPLANT;  Surgeon: Curt Bears,  Ocie Doyne, MD;  Location: [facility];  Service: Cardiovascular;  Laterality: N/A;  . INCISIONAL HERNIA REPAIR N/A 12/25/2014   Procedure: HERNIA REPAIR INCISIONAL WITH MESH;  Surgeon: [name], MD;  Location: [facility];  Service: General;  Laterality: N/A;  . INSERTION OF MESH N/A 12/25/2014   Procedure: INSERTION OF MESH;  Surgeon: [name], MD;  Location: [facility];  Service: General;  Laterality: N/A;  . IR IMAGING GUIDED PORT INSERTION  04/11/2018  . LAPAROSCOPIC CHOLECYSTECTOMY    . LOWER EXTREMITY ANGIOGRAM Left 12/30/2015   Procedure: Lower Extremity Angiogram;  Surgeon: [name], MD;  Location: [facility];  Service: Cardiovascular;  Laterality: Left;  . PERIPHERAL VASCULAR CATHETERIZATION N/A 12/30/2015   Procedure: Abdominal Aortogram;  Surgeon: [name], MD;  Location: [facility];  Service: Cardiovascular;  Laterality: N/A;  . POLYPECTOMY  05/30/2014    Procedure: POLYPECTOMY;  Surgeon: [name], MD;  Location: [facility];  Service: Endoscopy;;  . TONSILLECTOMY AND ADENOIDECTOMY  ~ 1970  . TYMPANOSTOMY TUBE PLACEMENT Bilateral ~ 1970    REVIEW OF SYSTEMS:  A comprehensive review of systems was negative except for: Constitutional: positive for fatigue   PHYSICAL EXAMINATION: General appearance: alert, cooperative and no distress Head: Normocephalic, without obvious abnormality, atraumatic Neck: no adenopathy, no JVD, supple, symmetrical, trachea midline and thyroid not enlarged, symmetric, no tenderness/mass/nodules Lymph nodes: Cervical, supraclavicular, and axillary nodes normal. Resp: clear to auscultation bilaterally Back: symmetric, no curvature. ROM normal. No CVA tenderness. Cardio: regular rate and rhythm, S1, S2 normal, no murmur, click, rub or gallop GI: soft, non-tender; bowel sounds normal; no masses,  no organomegaly Extremities: Right below knee amputation otherwise no edema.  ECOG PERFORMANCE STATUS: 1 - Symptomatic but completely ambulatory  Blood pressure (!) 91/56, pulse 63, temperature 98.5 F (36.9 C), temperature source Oral, resp. rate 18, height _0  (1.854 m), weight 232 lb (105.2 kg), SpO2 100 %.  LABORATORY DATA: Lab Results  Component Value Date   WBC 8.1 04/11/2019   HGB 14.0 04/11/2019   HCT 43.7 04/11/2019   MCV 95.8 04/11/2019   PLT 248 04/11/2019      Chemistry      Component Value Date/Time   NA 136 03/28/2019 0752   NA 139 02/27/2018 1457   K 4.3 03/28/2019 0752   CL 99 03/28/2019 0752   CO2 27 03/28/2019 0752   BUN 14 03/28/2019 0752   BUN 7 02/27/2018 1457   CREATININE 1.22 03/28/2019 0752   CREATININE 1.13 06/28/2016 1453      Component Value Date/Time   CALCIUM 8.6 (L) 03/28/2019 0752   ALKPHOS 140 (H) 03/28/2019 0752   AST 31 03/28/2019 0752   ALT 41 03/28/2019 0752   BILITOT 0.3 03/28/2019 0752       RADIOGRAPHIC STUDIES: No results found.  ASSESSMENT AND PLAN: This  is a very pleasant 58 years old white male recently diagnosed with a stage IIIa non-small cell lung cancer, adenosquamous carcinoma.  He underwent a course of concurrent chemoradiation with weekly carboplatin and paclitaxel status post 6 cycles with partial response. The patient is currently undergoing treatment with consolidation immunotherapy with Imfinzi status post 20 cycles. The patient has been tolerating this treatment well with no concerning adverse effects. I recommended for him to proceed with cycle #21 today as planned. I will see him back for follow-up visit in 2 weeks for evaluation before the next cycle of his treatment. He  was advised to call immediately if he has any concerning symptoms in the interval. The patient voices understanding of current disease status and treatment options and is in agreement with the current care plan. All questions were answered. The patient knows to call the clinic with any problems, questions or concerns. We can certainly see the patient much sooner if necessary. I spent 10 minutes counseling the patient face to face. The total time spent in the appointment was 15 minutes.  Disclaimer: This note was dictated with voice recognition software. Similar sounding words can inadvertently be transcribed and may not be corrected upon review.

## 2019-04-12 ENCOUNTER — Telehealth: Payer: Self-pay | Admitting: *Deleted

## 2019-04-12 ENCOUNTER — Other Ambulatory Visit: Payer: Self-pay | Admitting: Internal Medicine

## 2019-04-12 DIAGNOSIS — R7989 Other specified abnormal findings of blood chemistry: Secondary | ICD-10-CM

## 2019-04-12 MED ORDER — LEVOTHYROXINE SODIUM 150 MCG PO TABS: ORAL_TABLET | ORAL | 1 refills | Status: DC

## 2019-04-12 MED ORDER — LEVOTHYROXINE SODIUM 125 MCG PO TABS: ORAL_TABLET | ORAL | 1 refills | Status: DC

## 2019-04-12 NOTE — Telephone Encounter (Signed)
-----   Message from Curt Bears, MD sent at 04/11/2019  3:20 PM EDT ----- Does not look like he is taking his levothyroxine.  Please recheck with him first before we change the dose.  If he needs a refill will be happy to send it. ----- Message ----- From: Buel Ream, Lab In [place]: 04/11/2019  10:03 AM EDT To: Curt Bears, MD

## 2019-04-12 NOTE — Telephone Encounter (Signed)
Spoke with pt wife and instructed pt Per Dr. Julien Nordmann to take 12mcg Levothyroxine 1 tablet daily.  Wife verbalized understanding.

## 2019-04-12 NOTE — Telephone Encounter (Signed)
I will change his levothyroxine to 150 mcg p.o. daily.  I sent the prescription to his pharmacy.

## 2019-04-18 DIAGNOSIS — Z89511 Acquired absence of right leg below knee: Secondary | ICD-10-CM | POA: Diagnosis not present

## 2019-04-18 DIAGNOSIS — G546 Phantom limb syndrome with pain: Secondary | ICD-10-CM | POA: Diagnosis not present

## 2019-04-18 DIAGNOSIS — G894 Chronic pain syndrome: Secondary | ICD-10-CM | POA: Diagnosis not present

## 2019-04-24 ENCOUNTER — Other Ambulatory Visit: Payer: Self-pay

## 2019-04-24 ENCOUNTER — Other Ambulatory Visit: Payer: Medicare Other

## 2019-04-24 DIAGNOSIS — Z20822 Contact with and (suspected) exposure to covid-19: Secondary | ICD-10-CM

## 2019-04-24 DIAGNOSIS — R6889 Other general symptoms and signs: Secondary | ICD-10-CM | POA: Diagnosis not present

## 2019-04-25 ENCOUNTER — Telehealth: Payer: Self-pay | Admitting: Internal Medicine

## 2019-04-25 ENCOUNTER — Encounter: Payer: Self-pay | Admitting: Internal Medicine

## 2019-04-25 ENCOUNTER — Inpatient Hospital Stay: Payer: Medicare Other

## 2019-04-25 ENCOUNTER — Inpatient Hospital Stay (HOSPITAL_BASED_OUTPATIENT_CLINIC_OR_DEPARTMENT_OTHER): Payer: Medicare Other | Admitting: Internal Medicine

## 2019-04-25 ENCOUNTER — Other Ambulatory Visit: Payer: Self-pay

## 2019-04-25 VITALS — BP 90/64 | HR 71 | Temp 98.3°F | Resp 18 | Ht 73.0 in | Wt 230.1 lb

## 2019-04-25 DIAGNOSIS — C3411 Malignant neoplasm of upper lobe, right bronchus or lung: Secondary | ICD-10-CM

## 2019-04-25 DIAGNOSIS — Z79899 Other long term (current) drug therapy: Secondary | ICD-10-CM | POA: Diagnosis not present

## 2019-04-25 DIAGNOSIS — R0609 Other forms of dyspnea: Secondary | ICD-10-CM

## 2019-04-25 DIAGNOSIS — Z5112 Encounter for antineoplastic immunotherapy: Secondary | ICD-10-CM

## 2019-04-25 DIAGNOSIS — Z95828 Presence of other vascular implants and grafts: Secondary | ICD-10-CM

## 2019-04-25 DIAGNOSIS — C3491 Malignant neoplasm of unspecified part of right bronchus or lung: Secondary | ICD-10-CM

## 2019-04-25 DIAGNOSIS — Z720 Tobacco use: Secondary | ICD-10-CM

## 2019-04-25 LAB — CBC WITH DIFFERENTIAL (CANCER CENTER ONLY)
Abs Immature Granulocytes: 0.2 10*3/uL — ABNORMAL HIGH (ref 0.00–0.07)
Basophils Absolute: 0.1 10*3/uL (ref 0.0–0.1)
Basophils Relative: 1 %
Eosinophils Absolute: 0.4 10*3/uL (ref 0.0–0.5)
Eosinophils Relative: 4 %
HCT: 41.6 % (ref 39.0–52.0)
Hemoglobin: 13.3 g/dL (ref 13.0–17.0)
Immature Granulocytes: 2 %
Lymphocytes Relative: 8 %
Lymphs Abs: 0.7 10*3/uL (ref 0.7–4.0)
MCH: 30.4 pg (ref 26.0–34.0)
MCHC: 32 g/dL (ref 30.0–36.0)
MCV: 95.2 fL (ref 80.0–100.0)
Monocytes Absolute: 0.7 10*3/uL (ref 0.1–1.0)
Monocytes Relative: 8 %
Neutro Abs: 6.9 10*3/uL (ref 1.7–7.7)
Neutrophils Relative %: 77 %
Platelet Count: 231 10*3/uL (ref 150–400)
RBC: 4.37 MIL/uL (ref 4.22–5.81)
RDW: 13.6 % (ref 11.5–15.5)
WBC Count: 8.8 10*3/uL (ref 4.0–10.5)
nRBC: 0 % (ref 0.0–0.2)

## 2019-04-25 LAB — CMP (CANCER CENTER ONLY)
ALT: 20 U/L (ref 0–44)
AST: 16 U/L (ref 15–41)
Albumin: 3.9 g/dL (ref 3.5–5.0)
Alkaline Phosphatase: 113 U/L (ref 38–126)
Anion gap: 8 (ref 5–15)
BUN: 11 mg/dL (ref 6–20)
CO2: 29 mmol/L (ref 22–32)
Calcium: 9 mg/dL (ref 8.9–10.3)
Chloride: 100 mmol/L (ref 98–111)
Creatinine: 1.1 mg/dL (ref 0.61–1.24)
GFR, Est AFR Am: >60 mL/min (ref >60)
GFR, Estimated: >60 mL/min (ref >60)
Glucose, Bld: 110 mg/dL — ABNORMAL HIGH (ref 70–99)
Potassium: 3.9 mmol/L (ref 3.5–5.1)
Sodium: 137 mmol/L (ref 135–145)
Total Bilirubin: 0.2 mg/dL — ABNORMAL LOW (ref 0.3–1.2)
Total Protein: 6.9 g/dL (ref 6.5–8.1)

## 2019-04-25 MED ORDER — SODIUM CHLORIDE 0.9 % IV SOLN
Freq: Once | INTRAVENOUS | Status: AC
  Administered 2019-04-25: 10:00:00 via INTRAVENOUS
  Filled 2019-04-25: qty 250

## 2019-04-25 MED ORDER — SODIUM CHLORIDE 0.9% FLUSH
10.0000 mL | Freq: Once | INTRAVENOUS | Status: AC
  Administered 2019-04-25: 10 mL
  Filled 2019-04-25: qty 10

## 2019-04-25 MED ORDER — SODIUM CHLORIDE 0.9 % IV SOLN
9.7000 mg/kg | Freq: Once | INTRAVENOUS | Status: AC
  Administered 2019-04-25: 1000 mg via INTRAVENOUS
  Filled 2019-04-25: qty 20

## 2019-04-25 MED ORDER — HEPARIN SOD (PORK) LOCK FLUSH 100 UNIT/ML IV SOLN
500.0000 [IU] | Freq: Once | INTRAVENOUS | Status: AC | PRN
  Administered 2019-04-25: 500 [IU]
  Filled 2019-04-25: qty 5

## 2019-04-25 MED ORDER — SODIUM CHLORIDE 0.9% FLUSH
10.0000 mL | INTRAVENOUS | Status: DC | PRN
  Administered 2019-04-25: 10 mL
  Filled 2019-04-25: qty 10

## 2019-04-25 NOTE — Patient Instructions (Signed)
Steps to Quit Smoking Smoking tobacco is the leading cause of preventable death. It can affect almost every organ in the body. Smoking puts you and people around you at risk for many serious, long-lasting (chronic) diseases. Quitting smoking can be hard, but it is one of the best things that you can do for your health. It is never too late to quit. How do I get ready to quit? When you decide to quit smoking, make a plan to help you succeed. Before you quit:  Pick a date to quit. Set a date within the next 2 weeks to give you time to prepare.  Write down the reasons why you are quitting. Keep this list in places where you will see it often.  Tell your family, friends, and co-workers that you are quitting. Their support is important.  Talk with your doctor about the choices that may help you quit.  Find out if your health insurance will pay for these treatments.  Know the people, places, things, and activities that make you want to smoke (triggers). Avoid them. What first steps can I take to quit smoking?  Throw away all cigarettes at home, at work, and in your car.  Throw away the things that you use when you smoke, such as ashtrays and lighters.  Clean your car. Make sure to empty the ashtray.  Clean your home, including curtains and carpets. What can I do to help me quit smoking? Talk with your doctor about taking medicines and seeing a counselor at the same time. You are more likely to succeed when you do both.  If you are pregnant or breastfeeding, talk with your doctor about counseling or other ways to quit smoking. Do not take medicine to help you quit smoking unless your doctor tells you to do so. To quit smoking: Quit right away  Quit smoking totally, instead of slowly cutting back on how much you smoke over a period of time.  Go to counseling. You are more likely to quit if you go to counseling sessions regularly. Take medicine You may take medicines to help you quit. Some  medicines need a prescription, and some you can buy over-the-counter. Some medicines may contain a drug called nicotine to replace the nicotine in cigarettes. Medicines may:  Help you to stop having the desire to smoke (cravings).  Help to stop the problems that come when you stop smoking (withdrawal symptoms). Your doctor may ask you to use:  Nicotine patches, gum, or lozenges.  Nicotine inhalers or sprays.  Non-nicotine medicine that is taken by mouth. Find resources Find resources and other ways to help you quit smoking and remain smoke-free after you quit. These resources are most helpful when you use them often. They include:  Online chats with a counselor.  Phone quitlines.  Printed self-help materials.  Support groups or group counseling.  Text messaging programs.  Mobile phone apps. Use apps on your mobile phone or tablet that can help you stick to your quit plan. There are many free apps for mobile phones and tablets as well as websites. Examples include Quit Guide from the CDC and smokefree.gov  What things can I do to make it easier to quit?   Talk to your family and friends. Ask them to support and encourage you.  Call a phone quitline (1-800-QUIT-NOW), reach out to support groups, or work with a counselor.  Ask people who smoke to not smoke around you.  Avoid places that make you want to smoke,   such as: ? Bars. ? Parties. ? Smoke-break areas at work.  Spend time with people who do not smoke.  Lower the stress in your life. Stress can make you want to smoke. Try these things to help your stress: ? Getting regular exercise. ? Doing deep-breathing exercises. ? Doing yoga. ? Meditating. ? Doing a body scan. To do this, close your eyes, focus on one area of your body at a time from head to toe. Notice which parts of your body are tense. Try to relax the muscles in those areas. How will I feel when I quit smoking? Day 1 to 3 weeks Within the first 24 hours,  you may start to have some problems that come from quitting tobacco. These problems are very bad 2-3 days after you quit, but they do not often last for more than 2-3 weeks. You may get these symptoms:  Mood swings.  Feeling restless, nervous, angry, or annoyed.  Trouble concentrating.  Dizziness.  Strong desire for high-sugar foods and nicotine.  Weight gain.  Trouble pooping (constipation).  Feeling like you may vomit (nausea).  Coughing or a sore throat.  Changes in how the medicines that you take for other issues work in your body.  Depression.  Trouble sleeping (insomnia). Week 3 and afterward After the first 2-3 weeks of quitting, you may start to notice more positive results, such as:  Better sense of smell and taste.  Less coughing and sore throat.  Slower heart rate.  Lower blood pressure.  Clearer skin.  Better breathing.  Fewer sick days. Quitting smoking can be hard. Do not give up if you fail the first time. Some people need to try a few times before they succeed. Do your best to stick to your quit plan, and talk with your doctor if you have any questions or concerns. Summary  Smoking tobacco is the leading cause of preventable death. Quitting smoking can be hard, but it is one of the best things that you can do for your health.  When you decide to quit smoking, make a plan to help you succeed.  Quit smoking right away, not slowly over a period of time.  When you start quitting, seek help from your doctor, family, or friends. This information is not intended to replace advice given to you by your health care provider. Make sure you discuss any questions you have with your health care provider. Document Released: 07/10/2009 Document Revised: 12/01/2018 Document Reviewed: 12/02/2018 Elsevier Patient Education  2020 [employer]  

## 2019-04-25 NOTE — Progress Notes (Signed)
[place] [phone]   Fax:([phone]  OFFICE PROGRESS NOTE  [place], [place] [address]  DIAGNOSIS:stage IIIA (T3, N2, M0) non-small cell lung cancer, adenosquamous carcinoma diagnosed in June 2019 and presented with large right upper lobe lung mass with questionable chest wall invasion as well as right hilar and mediastinal lymphadenopathy.  Biomarker Findings Tumor Mutational Burden - TMB-High (21 Muts/Mb) Microsatellite status - MS-Stable Genomic Findings For a complete list of the genes assayed, please refer to the Appendix. ATM A1287* CCND2 P281R KRAS G12V CHEK2 T378f*15 TP53 E298* 7 Disease relevant genes with no reportable alterations: ALK, EGFR, BRAF, MET, RET, ERBB2, ROS1  PRIOR THERAPY:Concurrent chemoradiation with chemotherapy consisting of weekly carboplatin for an AUC of 2 and paclitaxel 45 mg/m2. First dose given on 04/03/2018.  Status post 6 cycles.  Last dose was giving 05/15/2018 with partial response.  CURRENT THERAPY:Consolidation treatment with immunotherapy with Imfinzi (Durvalumab) 10 mg/KG every 2 weeks.  First dose 06/20/2018.  Status post 21 cycles.  INTERVAL HISTORY: Mark ABRIL58y.o. male returns to the clinic today for follow-up visit.  The patient is feeling fine today with no concerning complaints except for shortness of breath with exertion.  He denied having any chest pain, cough or hemoptysis.  Unfortunately he continues to smoke.  He denied having any nausea, vomiting, diarrhea or constipation.  He has no significant weight loss or night sweats.  He is here today for evaluation before starting cycle #22.   MEDICAL HISTORY: Past Medical History:  Diagnosis Date  . Acute hepatitis C virus infection 06/19/2007   Qualifier: Diagnosis of  By: MLeilani MerlCMA, Tiffany    . Acute ST elevation myocardial infarction (STEMI) involving left anterior descending (LAD) coronary artery  ([facility] 05/26/2016  . Acute ST elevation myocardial infarction (STEMI) of lateral wall ([facility] 05/26/2016  . 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 Anxiety   . Asthma   . Atherosclerosis of native arteries of the extremities with intermittent claudication 12/16/2011  . Chronic hepatitis C without hepatic coma ([facility] 05/03/2014  . COPD with chronic bronchitis and emphysema ([facility] 03/13/2018   FEV1 57%  . Depression   . DVT (deep venous thrombosis) ([facility] 2009; 2019   RLE; LLE  . Encounter for antineoplastic chemotherapy 03/22/2018  . Hepatitis C    "tx'd in 2015"  . High cholesterol   . Ischemic cardiomyopathy 03/02/2018  . Non-small cell carcinoma of right lung, stage 3 ([facility] 03/22/2018  . PAD (peripheral artery disease) ([facility] 01/25/2013  . Peripheral vascular disease, unspecified 07/20/2012  . Port-A-Cath in place 04/24/2018  . ST elevation myocardial infarction involving left anterior descending (LAD) coronary artery ([facility]   . STEMI (ST elevation myocardial infarction) ([facility] 05/26/2016  . Tobacco abuse 01/28/2016  . Tubular adenoma of colon 07/11/2014  . Urinary dribbling     ALLERGIES:  is allergic to lyrica [pregabalin] and neurontin [gabapentin].  MEDICATIONS:  Current Outpatient Medications  Medication Sig Dispense Refill  . alprazolam (XANAX) 2 MG tablet Take 2 mg by mouth 4 (four) times daily as needed for anxiety.     .Marland Kitchenamitriptyline (ELAVIL) 100 MG tablet Take 100 mg by mouth at bedtime.     .Marland Kitchenaspirin 81 MG EC tablet Take 81 mg by mouth daily.      .Marland Kitchenatorvastatin (LIPITOR) 80 MG tablet Take 1 tablet (80 mg total) by mouth daily at 6 PM. 30 tablet 12  . carvedilol (  COREG) 6.25 MG tablet TAKE 1 TABLET BY MOUTH TWICE A DAY WITH A MEAL 60 tablet 8  . citalopram (CELEXA) 40 MG tablet Take 40 mg by mouth daily.  11  . furosemide (LASIX) 20 MG tablet Take 1 tablet (20 mg total) by mouth daily. 90 tablet 3  . HYDROcodone-acetaminophen (NORCO) 10-325 MG tablet Take 1  tablet by mouth every 4 (four) hours as needed for moderate pain.   0  . levETIRAcetam (KEPPRA) 750 MG tablet Take 1 tablet (750 mg total) by mouth 2 (two) times daily. 60 tablet 5  . levothyroxine (SYNTHROID) 125 MCG tablet One tab po daily. 30 tablet 1  . lidocaine-prilocaine (EMLA) cream Apply 1 application topically as needed. 30 g 0  . lisinopril (PRINIVIL,ZESTRIL) 2.5 MG tablet Take 1 tablet (2.5 mg total) by mouth daily. 30 tablet 12  . nitroGLYCERIN (NITROSTAT) 0.4 MG SL tablet Place 1 tablet (0.4 mg total) under the tongue every 5 (five) minutes x 3 doses as needed for chest pain. (Patient not taking: Reported on 03/14/2019) 25 tablet 2  . prochlorperazine (COMPAZINE) 10 MG tablet Take 1 tablet (10 mg total) by mouth every 6 (six) hours as needed for nausea or vomiting. 30 tablet 0  . spironolactone (ALDACTONE) 25 MG tablet TAKE 1 TABLET BY MOUTH EVERY DAY 90 tablet 0  . sucralfate (CARAFATE) 1 g tablet Take 1 tablet (1 g total) by mouth 4 (four) times daily -  with meals and at bedtime. 5 min before meals for radiation induced esophagitis 120 tablet 2  . tamsulosin (FLOMAX) 0.4 MG CAPS capsule Take 1 capsule by mouth daily.    . Tiotropium Bromide-Olodaterol (STIOLTO RESPIMAT) 2.5-2.5 MCG/ACT AERS Inhale 2 puffs into the lungs daily. 1 Inhaler 5  . umeclidinium-vilanterol (ANORO ELLIPTA) 62.5-25 MCG/INH AEPB Inhale 1 puff into the lungs daily. 1 each 0   No current facility-administered medications for this visit.     SURGICAL HISTORY:  Past Surgical History:  Procedure Laterality Date  . AORTOGRAM  08/08/2009   for LLE claudication     By Dr. Oneida Alar  . 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 BIOPSY N/A 05/30/2014   Procedure: BIOPSY;  Surgeon: [name], MD;  Location: [facility];  Service: Endoscopy;  Laterality: N/A;  . BLADDER TUMOR EXCISION  8/812  . CARDIAC CATHETERIZATION N/A 05/26/2016   Procedure: Left Heart Cath and Coronary Angiography;  Surgeon: [name], MD;   Location: [facility];  Service: Cardiovascular;  Laterality: N/A;  . CARDIAC CATHETERIZATION N/A 05/26/2016   Procedure: Coronary Stent Intervention;  Surgeon: [name], MD;  Location: [facility];  Service: Cardiovascular;  Laterality: N/A;  . COLONOSCOPY WITH PROPOFOL N/A 05/30/2014   PRF:FMBWGY colonic polyp-likely source of hematochezia-removed as described above  . ESOPHAGOGASTRODUODENOSCOPY (EGD) WITH PROPOFOL N/A 05/30/2014   KZL:DJTTSV and bulbar erosions s/p gastric biopsy. No evidence of portal gastropathy on today's examination.  . FEMORAL-TIBIAL BYPASS GRAFT  2009   Right side using non-reversed GSV   By Dr. Oneida Alar  . FEMORAL-TIBIAL BYPASS GRAFT  11/24/2007   Right femoral to anterior tibial BPG   by Dr. Oneida Alar  . FINGER SURGERY Left    "straightened my pinky"  . HAND TENDON SURGERY Left 2013   Left 5th finger  . HERNIA REPAIR     "stomach"  . ICD IMPLANT N/A 03/02/2018   Procedure: ICD IMPLANT;  Surgeon: [name], MD;  Location: [facility];  Service: Cardiovascular;  Laterality: N/A;  . INCISIONAL HERNIA REPAIR N/A 12/25/2014   Procedure: HERNIA REPAIR INCISIONAL WITH MESH;  Surgeon: [name], MD;  Location: [facility];  Service: General;  Laterality: N/A;  . INSERTION OF MESH N/A 12/25/2014   Procedure: INSERTION OF MESH;  Surgeon: [name], MD;  Location: [facility];  Service: General;  Laterality: N/A;  . IR IMAGING GUIDED PORT INSERTION  04/11/2018  . LAPAROSCOPIC CHOLECYSTECTOMY    . LOWER EXTREMITY ANGIOGRAM Left 12/30/2015   Procedure: Lower Extremity Angiogram;  Surgeon: [name], MD;  Location: [facility];  Service: Cardiovascular;  Laterality: Left;  . PERIPHERAL VASCULAR CATHETERIZATION N/A 12/30/2015   Procedure: Abdominal Aortogram;  Surgeon: [name], MD;  Location: [facility];  Service: Cardiovascular;  Laterality: N/A;  . POLYPECTOMY  05/30/2014   Procedure: POLYPECTOMY;  Surgeon: [name],  MD;  Location: [facility];  Service: Endoscopy;;  . TONSILLECTOMY AND ADENOIDECTOMY  ~ 1970  . TYMPANOSTOMY TUBE PLACEMENT Bilateral ~ 1970    REVIEW OF SYSTEMS:  A comprehensive review of systems was negative except for: Constitutional: positive for fatigue Respiratory: positive for dyspnea on exertion   PHYSICAL EXAMINATION: General appearance: alert, cooperative and no distress Head: Normocephalic, without obvious abnormality, atraumatic Neck: no adenopathy, no JVD, supple, symmetrical, trachea midline and thyroid not enlarged, symmetric, no tenderness/mass/nodules Lymph nodes: Cervical, supraclavicular, and axillary nodes normal. Resp: clear to auscultation bilaterally Back: symmetric, no curvature. ROM normal. No CVA tenderness. Cardio: regular rate and rhythm, S1, S2 normal, no murmur, click, rub or gallop GI: soft, non-tender; bowel sounds normal; no masses,  no organomegaly Extremities: Right below knee amputation otherwise no edema.  ECOG PERFORMANCE STATUS: 1 - Symptomatic but completely ambulatory  Blood pressure 90/64, pulse 71, temperature 98.3 F (36.8 C), temperature source Oral, resp. rate 18, height '6\' 1"'$  (1.854 m), weight 230 lb 1.6 oz (104.4 kg), SpO2 94 %.  LABORATORY DATA: Lab Results  Component Value Date   WBC 8.8 04/25/2019   HGB 13.3 04/25/2019   HCT 41.6 04/25/2019   MCV 95.2 04/25/2019   PLT 231 04/25/2019      Chemistry      Component Value Date/Time   NA 137 04/11/2019 0916   NA 139 02/27/2018 1457   K 4.5 04/11/2019 0916   CL 97 (L) 04/11/2019 0916   CO2 28 04/11/2019 0916   BUN 13 04/11/2019 0916   BUN 7 02/27/2018 1457   CREATININE 1.21 04/11/2019 0916   CREATININE 1.13 06/28/2016 1453      Component Value Date/Time   CALCIUM 8.8 (L) 04/11/2019 0916   ALKPHOS 124 04/11/2019 0916   AST 27 04/11/2019 0916   ALT 27 04/11/2019 0916   BILITOT 0.3 04/11/2019 0916       RADIOGRAPHIC STUDIES: No results found.  ASSESSMENT AND PLAN: This  is a very pleasant 58 years old white male recently diagnosed with a stage IIIa non-small cell lung cancer, adenosquamous carcinoma.  He underwent a course of concurrent chemoradiation with weekly carboplatin and paclitaxel status post 6 cycles with partial response. The patient is currently undergoing treatment with consolidation immunotherapy with Imfinzi status post 21 cycles. He continues to tolerate his treatment well with no concerning adverse effects. I recommended for the patient to proceed with cycle #22 today as planned. I will see him back for follow-up visit in 2 weeks for evaluation before the next cycle of his treatment. For smoking cessation I  strongly advised the patient to quit smoking. He was advised to call immediately if he has any concerning symptoms in the interval. The patient voices understanding of current disease status and treatment options and is in agreement with the current care plan. All questions were answered. The patient knows to call the clinic with any problems, questions or concerns. We can certainly see the patient much sooner if necessary. I spent 10 minutes counseling the patient face to face. The total time spent in the appointment was 15 minutes.  Disclaimer: This note was dictated with voice recognition software. Similar sounding words can inadvertently be transcribed and may not be corrected upon review.

## 2019-04-25 NOTE — Patient Instructions (Signed)
[facility] Discharge Instructions for Patients Receiving Chemotherapy  Today you received the following Immunotherapy: Imfinzi  To help prevent nausea and vomiting after your treatment, we encourage you to take your nausea medication as directed by your MD.   If you develop nausea and vomiting that is not controlled by your nausea medication, call the clinic.   BELOW ARE SYMPTOMS THAT SHOULD BE REPORTED IMMEDIATELY:  *FEVER GREATER THAN 100.5 F  *CHILLS WITH OR WITHOUT FEVER  NAUSEA AND VOMITING THAT IS NOT CONTROLLED WITH YOUR NAUSEA MEDICATION  *UNUSUAL SHORTNESS OF BREATH  *UNUSUAL BRUISING OR BLEEDING  TENDERNESS IN MOUTH AND THROAT WITH OR WITHOUT PRESENCE OF ULCERS  *URINARY PROBLEMS  *BOWEL PROBLEMS  UNUSUAL RASH Items with * indicate a potential emergency and should be followed up as soon as possible.  Feel free to call the clinic should you have any questions or concerns. The clinic phone number is [phone].  Please show the [facility] at check-in to the Emergency Department and triage nurse. Coronavirus (COVID-19) Are you at risk?  Are you at risk for the Coronavirus (COVID-19)?  To be considered HIGH RISK for Coronavirus (COVID-19), you have to meet the following criteria:  . Traveled to [place], [place], [place], [place] or [place]; or in the [place] to [place], [place], [place], or [place]; and have fever, cough, and shortness of breath within the last 2 weeks of travel OR . Been in close contact with a person diagnosed with COVID-19 within the last 2 weeks and have fever, cough, and shortness of breath . IF YOU DO NOT MEET THESE CRITERIA, YOU ARE CONSIDERED LOW RISK FOR COVID-19.  What to do if you are HIGH RISK for COVID-19?  [name] If you are having a medical emergency, call 911. . Seek medical care right away. Before you go to a doctor's office, urgent care or emergency department, call ahead and tell them about your  recent travel, contact with someone diagnosed with COVID-19, and your symptoms. You should receive instructions from your physician's office regarding next steps of care.  . When you arrive at healthcare provider, tell the healthcare staff immediately you have returned from visiting [place], [place], [place], [place] or [place]; or traveled in the [place] to [place], [place], [place], or [place]; in the last two weeks or you have been in close contact with a person diagnosed with COVID-19 in the last 2 weeks.   . Tell the health care staff about your symptoms: fever, cough and shortness of breath. . After you have been seen by a medical provider, you will be either: o Tested for (COVID-19) and discharged home on quarantine except to seek medical care if symptoms worsen, and asked to  - Stay home and avoid contact with others until you get your results (4-5 days)  - Avoid travel on public transportation if possible (such as bus, train, or airplane) or o Sent to the Emergency Department by EMS for evaluation, COVID-19 testing, and possible admission depending on your condition and test results.  What to do if you are LOW RISK for COVID-19?  Reduce your risk of any infection by using the same precautions used for avoiding the common cold or flu:  [name] Wash your hands often with soap and warm water for at least 20 seconds.  If soap and water are not readily available, use an alcohol-based hand sanitizer with at least 60% alcohol.  . If coughing or  sneezing, cover your mouth and nose by coughing or sneezing into the elbow areas of your shirt or coat, into a tissue or into your sleeve (not your hands). . Avoid shaking hands with others and consider head nods or verbal greetings only. . Avoid touching your eyes, nose, or mouth with unwashed hands.  . Avoid close contact with people who are sick. . Avoid places or events with large numbers of people in one location, like concerts or sporting  events. . Carefully consider travel plans you have or are making. . If you are planning any travel outside or inside the [place], visit the CDC's Travelers' Health webpage for the latest health notices. . If you have some symptoms but not all symptoms, continue to monitor at home and seek medical attention if your symptoms worsen. . If you are having a medical emergency, call 911.   [place] / e-Visit: [email or web address]         [facility]: [facility]: [phone]                   [facility]: [phone]

## 2019-04-25 NOTE — Telephone Encounter (Signed)
Scheduled appt per 7/29 los - pt to get an updated schedule next visit.

## 2019-04-26 LAB — NOVEL CORONAVIRUS, NAA: SARS-CoV-2, NAA: NOT DETECTED

## 2019-04-30 ENCOUNTER — Telehealth: Payer: Self-pay | Admitting: Internal Medicine

## 2019-04-30 NOTE — Telephone Encounter (Signed)
Pt aware covid lab test negative, not detected °

## 2019-05-09 ENCOUNTER — Inpatient Hospital Stay: Payer: Medicare Other

## 2019-05-09 ENCOUNTER — Encounter: Payer: Self-pay | Admitting: Internal Medicine

## 2019-05-09 ENCOUNTER — Inpatient Hospital Stay (HOSPITAL_BASED_OUTPATIENT_CLINIC_OR_DEPARTMENT_OTHER): Payer: Medicare Other | Admitting: Internal Medicine

## 2019-05-09 ENCOUNTER — Other Ambulatory Visit: Payer: Self-pay

## 2019-05-09 ENCOUNTER — Inpatient Hospital Stay: Payer: Medicare Other | Attending: Internal Medicine

## 2019-05-09 VITALS — BP 104/63 | HR 74 | Temp 98.0°F | Resp 18 | Ht 73.0 in | Wt 233.9 lb

## 2019-05-09 DIAGNOSIS — F329 Major depressive disorder, single episode, unspecified: Secondary | ICD-10-CM | POA: Insufficient documentation

## 2019-05-09 DIAGNOSIS — C3491 Malignant neoplasm of unspecified part of right bronchus or lung: Secondary | ICD-10-CM | POA: Diagnosis not present

## 2019-05-09 DIAGNOSIS — Z95810 Presence of automatic (implantable) cardiac defibrillator: Secondary | ICD-10-CM | POA: Diagnosis not present

## 2019-05-09 DIAGNOSIS — Z5112 Encounter for antineoplastic immunotherapy: Secondary | ICD-10-CM

## 2019-05-09 DIAGNOSIS — C3411 Malignant neoplasm of upper lobe, right bronchus or lung: Secondary | ICD-10-CM | POA: Diagnosis not present

## 2019-05-09 DIAGNOSIS — Z79899 Other long term (current) drug therapy: Secondary | ICD-10-CM | POA: Insufficient documentation

## 2019-05-09 DIAGNOSIS — J449 Chronic obstructive pulmonary disease, unspecified: Secondary | ICD-10-CM | POA: Insufficient documentation

## 2019-05-09 DIAGNOSIS — F419 Anxiety disorder, unspecified: Secondary | ICD-10-CM | POA: Insufficient documentation

## 2019-05-09 DIAGNOSIS — I252 Old myocardial infarction: Secondary | ICD-10-CM | POA: Insufficient documentation

## 2019-05-09 DIAGNOSIS — E7800 Pure hypercholesterolemia, unspecified: Secondary | ICD-10-CM | POA: Diagnosis not present

## 2019-05-09 DIAGNOSIS — Z7982 Long term (current) use of aspirin: Secondary | ICD-10-CM | POA: Diagnosis not present

## 2019-05-09 DIAGNOSIS — Z95828 Presence of other vascular implants and grafts: Secondary | ICD-10-CM

## 2019-05-09 DIAGNOSIS — Z86718 Personal history of other venous thrombosis and embolism: Secondary | ICD-10-CM | POA: Insufficient documentation

## 2019-05-09 LAB — CMP (CANCER CENTER ONLY)
ALT: 44 U/L (ref 0–44)
AST: 36 U/L (ref 15–41)
Albumin: 3.9 g/dL (ref 3.5–5.0)
Alkaline Phosphatase: 125 U/L (ref 38–126)
Anion gap: 11 (ref 5–15)
BUN: 21 mg/dL — ABNORMAL HIGH (ref 6–20)
CO2: 28 mmol/L (ref 22–32)
Calcium: 8.9 mg/dL (ref 8.9–10.3)
Chloride: 98 mmol/L (ref 98–111)
Creatinine: 1.21 mg/dL (ref 0.61–1.24)
GFR, Est AFR Am: >60 mL/min (ref >60)
GFR, Estimated: >60 mL/min (ref >60)
Glucose, Bld: 94 mg/dL (ref 70–99)
Potassium: 4.1 mmol/L (ref 3.5–5.1)
Sodium: 137 mmol/L (ref 135–145)
Total Bilirubin: 0.3 mg/dL (ref 0.3–1.2)
Total Protein: 7.1 g/dL (ref 6.5–8.1)

## 2019-05-09 LAB — CBC WITH DIFFERENTIAL (CANCER CENTER ONLY)
Abs Immature Granulocytes: 0.08 10*3/uL — ABNORMAL HIGH (ref 0.00–0.07)
Basophils Absolute: 0.1 10*3/uL (ref 0.0–0.1)
Basophils Relative: 1 %
Eosinophils Absolute: 0.3 10*3/uL (ref 0.0–0.5)
Eosinophils Relative: 4 %
HCT: 41.5 % (ref 39.0–52.0)
Hemoglobin: 13.3 g/dL (ref 13.0–17.0)
Immature Granulocytes: 1 %
Lymphocytes Relative: 9 %
Lymphs Abs: 0.7 10*3/uL (ref 0.7–4.0)
MCH: 30.7 pg (ref 26.0–34.0)
MCHC: 32 g/dL (ref 30.0–36.0)
MCV: 95.8 fL (ref 80.0–100.0)
Monocytes Absolute: 0.7 10*3/uL (ref 0.1–1.0)
Monocytes Relative: 9 %
Neutro Abs: 5.5 10*3/uL (ref 1.7–7.7)
Neutrophils Relative %: 76 %
Platelet Count: 188 10*3/uL (ref 150–400)
RBC: 4.33 MIL/uL (ref 4.22–5.81)
RDW: 14.2 % (ref 11.5–15.5)
WBC Count: 7.3 10*3/uL (ref 4.0–10.5)
nRBC: 0 % (ref 0.0–0.2)

## 2019-05-09 LAB — TSH: TSH: 28.987 u[IU]/mL — ABNORMAL HIGH (ref 0.320–4.118)

## 2019-05-09 MED ORDER — HEPARIN SOD (PORK) LOCK FLUSH 100 UNIT/ML IV SOLN
500.0000 [IU] | Freq: Once | INTRAVENOUS | Status: AC | PRN
  Administered 2019-05-09: 500 [IU]
  Filled 2019-05-09: qty 5

## 2019-05-09 MED ORDER — SODIUM CHLORIDE 0.9% FLUSH
10.0000 mL | Freq: Once | INTRAVENOUS | Status: AC
  Administered 2019-05-09: 10 mL
  Filled 2019-05-09: qty 10

## 2019-05-09 MED ORDER — SODIUM CHLORIDE 0.9 % IV SOLN
Freq: Once | INTRAVENOUS | Status: AC
  Administered 2019-05-09: 09:00:00 via INTRAVENOUS
  Filled 2019-05-09: qty 250

## 2019-05-09 MED ORDER — SODIUM CHLORIDE 0.9% FLUSH
10.0000 mL | INTRAVENOUS | Status: DC | PRN
  Administered 2019-05-09: 10 mL
  Filled 2019-05-09: qty 10

## 2019-05-09 MED ORDER — SODIUM CHLORIDE 0.9 % IV SOLN
9.7000 mg/kg | Freq: Once | INTRAVENOUS | Status: AC
  Administered 2019-05-09: 1000 mg via INTRAVENOUS
  Filled 2019-05-09: qty 20

## 2019-05-09 NOTE — Patient Instructions (Signed)
[facility] Discharge Instructions for Patients Receiving Chemotherapy  Today you received the following Immunotherapy: Imfinzi  To help prevent nausea and vomiting after your treatment, we encourage you to take your nausea medication as directed by your MD.   If you develop nausea and vomiting that is not controlled by your nausea medication, call the clinic.   BELOW ARE SYMPTOMS THAT SHOULD BE REPORTED IMMEDIATELY:  *FEVER GREATER THAN 100.5 F  *CHILLS WITH OR WITHOUT FEVER  NAUSEA AND VOMITING THAT IS NOT CONTROLLED WITH YOUR NAUSEA MEDICATION  *UNUSUAL SHORTNESS OF BREATH  *UNUSUAL BRUISING OR BLEEDING  TENDERNESS IN MOUTH AND THROAT WITH OR WITHOUT PRESENCE OF ULCERS  *URINARY PROBLEMS  *BOWEL PROBLEMS  UNUSUAL RASH Items with * indicate a potential emergency and should be followed up as soon as possible.  Feel free to call the clinic should you have any questions or concerns. The clinic phone number is [phone].  Please show the [facility] at check-in to the Emergency Department and triage nurse. Coronavirus (COVID-19) Are you at risk?  Are you at risk for the Coronavirus (COVID-19)?  To be considered HIGH RISK for Coronavirus (COVID-19), you have to meet the following criteria:  . Traveled to [place], [place], [place], [place] or [place]; or in the [place] to [place], [place], [place], or [place]; and have fever, cough, and shortness of breath within the last 2 weeks of travel OR . Been in close contact with a person diagnosed with COVID-19 within the last 2 weeks and have fever, cough, and shortness of breath . IF YOU DO NOT MEET THESE CRITERIA, YOU ARE CONSIDERED LOW RISK FOR COVID-19.  What to do if you are HIGH RISK for COVID-19?  [name] If you are having a medical emergency, call 911. . Seek medical care right away. Before you go to a doctor's office, urgent care or emergency department, call ahead and tell them about your  recent travel, contact with someone diagnosed with COVID-19, and your symptoms. You should receive instructions from your physician's office regarding next steps of care.  . When you arrive at healthcare provider, tell the healthcare staff immediately you have returned from visiting [place], [place], [place], [place] or [place]; or traveled in the [place] to [place], [place], [place], or [place]; in the last two weeks or you have been in close contact with a person diagnosed with COVID-19 in the last 2 weeks.   . Tell the health care staff about your symptoms: fever, cough and shortness of breath. . After you have been seen by a medical provider, you will be either: o Tested for (COVID-19) and discharged home on quarantine except to seek medical care if symptoms worsen, and asked to  - Stay home and avoid contact with others until you get your results (4-5 days)  - Avoid travel on public transportation if possible (such as bus, train, or airplane) or o Sent to the Emergency Department by EMS for evaluation, COVID-19 testing, and possible admission depending on your condition and test results.  What to do if you are LOW RISK for COVID-19?  Reduce your risk of any infection by using the same precautions used for avoiding the common cold or flu:  [name] Wash your hands often with soap and warm water for at least 20 seconds.  If soap and water are not readily available, use an alcohol-based hand sanitizer with at least 60% alcohol.  . If coughing or  sneezing, cover your mouth and nose by coughing or sneezing into the elbow areas of your shirt or coat, into a tissue or into your sleeve (not your hands). . Avoid shaking hands with others and consider head nods or verbal greetings only. . Avoid touching your eyes, nose, or mouth with unwashed hands.  . Avoid close contact with people who are sick. . Avoid places or events with large numbers of people in one location, like concerts or sporting  events. . Carefully consider travel plans you have or are making. . If you are planning any travel outside or inside the [place], visit the CDC's Travelers' Health webpage for the latest health notices. . If you have some symptoms but not all symptoms, continue to monitor at home and seek medical attention if your symptoms worsen. . If you are having a medical emergency, call 911.   [place] / e-Visit: [email or web address]         [facility]: [facility]: [phone]                   [facility]: [phone]

## 2019-05-09 NOTE — Progress Notes (Signed)
[place] Telephone:([phone]   [phone]) [phone]  OFFICE PROGRESS NOTE  [place], [place] [address]  DIAGNOSIS:stage IIIA (T3, N2, M0) non-small cell lung cancer, adenosquamous carcinoma diagnosed in June 2019 and presented with large right upper lobe lung mass with questionable chest wall invasion as well as right hilar and mediastinal lymphadenopathy.  Biomarker Findings Tumor Mutational Burden - TMB-High (21 Muts/Mb) Microsatellite status - MS-Stable Genomic Findings For a complete list of the genes assayed, please refer to the Appendix. ATM E1624* CCND2 P281R KRAS G12V CHEK2 T372f*15 TP53 E298* 7 Disease relevant genes with no reportable alterations: ALK, EGFR, BRAF, MET, RET, ERBB2, ROS1  PRIOR THERAPY:Concurrent chemoradiation with chemotherapy consisting of weekly carboplatin for an AUC of 2 and paclitaxel 45 mg/m2. First dose given on 04/03/2018.  Status post 6 cycles.  Last dose was giving 05/15/2018 with partial response.  CURRENT THERAPY:Consolidation treatment with immunotherapy with Imfinzi (Durvalumab) 10 mg/KG every 2 weeks.  First dose 06/20/2018.  Status post 22 cycles.  INTERVAL HISTORY: Mark OLANDER58y.o. male returns to the clinic today for follow-up visit.  The patient is feeling fine today with no concerning complaints.  He had one episode of seizure activity recently.  He has a history of seizure activity since childhood.  He denied having any chest pain, shortness of breath, cough or hemoptysis.  The patient has no nausea, vomiting, diarrhea or constipation.  He has no significant weight loss or night sweats.  He continues to tolerate his treatment with Imfinzi fairly well.  The patient is here today for evaluation before starting cycle #23.  MEDICAL HISTORY: Past Medical History:  Diagnosis Date  . Acute hepatitis C virus infection 06/19/2007   Qualifier: Diagnosis of  By: MLeilani MerlCMA, Tiffany     . Acute ST elevation myocardial infarction (STEMI) involving left anterior descending (LAD) coronary artery ([facility] 05/26/2016  . Acute ST elevation myocardial infarction (STEMI) of lateral wall ([facility] 05/26/2016  . 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 Anxiety   . Asthma   . Atherosclerosis of native arteries of the extremities with intermittent claudication 12/16/2011  . Chronic hepatitis C without hepatic coma ([facility] 05/03/2014  . COPD with chronic bronchitis and emphysema ([facility] 03/13/2018   FEV1 57%  . Depression   . DVT (deep venous thrombosis) ([facility] 2009; 2019   RLE; LLE  . Encounter for antineoplastic chemotherapy 03/22/2018  . Hepatitis C    "tx'd in 2015"  . High cholesterol   . Ischemic cardiomyopathy 03/02/2018  . Non-small cell carcinoma of right lung, stage 3 ([facility] 03/22/2018  . PAD (peripheral artery disease) ([facility] 01/25/2013  . Peripheral vascular disease, unspecified 07/20/2012  . Port-A-Cath in place 04/24/2018  . ST elevation myocardial infarction involving left anterior descending (LAD) coronary artery ([facility]   . STEMI (ST elevation myocardial infarction) ([facility] 05/26/2016  . Tobacco abuse 01/28/2016  . Tubular adenoma of colon 07/11/2014  . Urinary dribbling     ALLERGIES:  is allergic to lyrica [pregabalin] and neurontin [gabapentin].  MEDICATIONS:  Current Outpatient Medications  Medication Sig Dispense Refill  . alprazolam (XANAX) 2 MG tablet Take 2 mg by mouth 4 (four) times daily as needed for anxiety.     .Marland Kitchenamitriptyline (ELAVIL) 100 MG tablet Take 100 mg by mouth at bedtime.     .Marland Kitchenaspirin 81 MG EC tablet Take 81 mg by mouth daily.      .Marland Kitchenatorvastatin (LIPITOR) 80  MG tablet Take 1 tablet (80 mg total) by mouth daily at 6 PM. 30 tablet 12  . carvedilol (COREG) 6.25 MG tablet TAKE 1 TABLET BY MOUTH TWICE A DAY WITH A MEAL 60 tablet 8  . citalopram (CELEXA) 40 MG tablet Take 40 mg by mouth daily.  11  . furosemide (LASIX) 20 MG tablet Take 1  tablet (20 mg total) by mouth daily. 90 tablet 3  . HYDROcodone-acetaminophen (NORCO) 10-325 MG tablet Take 1 tablet by mouth every 4 (four) hours as needed for moderate pain.   0  . levETIRAcetam (KEPPRA) 750 MG tablet Take 1 tablet (750 mg total) by mouth 2 (two) times daily. 60 tablet 5  . levothyroxine (SYNTHROID) 125 MCG tablet One tab po daily. 30 tablet 1  . lidocaine-prilocaine (EMLA) cream Apply 1 application topically as needed. 30 g 0  . lisinopril (PRINIVIL,ZESTRIL) 2.5 MG tablet Take 1 tablet (2.5 mg total) by mouth daily. 30 tablet 12  . nitroGLYCERIN (NITROSTAT) 0.4 MG SL tablet Place 1 tablet (0.4 mg total) under the tongue every 5 (five) minutes x 3 doses as needed for chest pain. (Patient not taking: Reported on 03/14/2019) 25 tablet 2  . prochlorperazine (COMPAZINE) 10 MG tablet Take 1 tablet (10 mg total) by mouth every 6 (six) hours as needed for nausea or vomiting. 30 tablet 0  . spironolactone (ALDACTONE) 25 MG tablet TAKE 1 TABLET BY MOUTH EVERY DAY 90 tablet 0  . sucralfate (CARAFATE) 1 g tablet Take 1 tablet (1 g total) by mouth 4 (four) times daily -  with meals and at bedtime. 5 min before meals for radiation induced esophagitis 120 tablet 2  . tamsulosin (FLOMAX) 0.4 MG CAPS capsule Take 1 capsule by mouth daily.    . Tiotropium Bromide-Olodaterol (STIOLTO RESPIMAT) 2.5-2.5 MCG/ACT AERS Inhale 2 puffs into the lungs daily. 1 Inhaler 5  . umeclidinium-vilanterol (ANORO ELLIPTA) 62.5-25 MCG/INH AEPB Inhale 1 puff into the lungs daily. 1 each 0   No current facility-administered medications for this visit.     SURGICAL HISTORY:  Past Surgical History:  Procedure Laterality Date  . AORTOGRAM  08/08/2009   for LLE claudication     By Dr. Oneida Alar  . 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 BIOPSY N/A 05/30/2014   Procedure: BIOPSY;  Surgeon: [name], MD;  Location: [facility];  Service: Endoscopy;  Laterality: N/A;  . BLADDER TUMOR EXCISION  8/812  . CARDIAC  CATHETERIZATION N/A 05/26/2016   Procedure: Left Heart Cath and Coronary Angiography;  Surgeon: [name], MD;  Location: [facility];  Service: Cardiovascular;  Laterality: N/A;  . CARDIAC CATHETERIZATION N/A 05/26/2016   Procedure: Coronary Stent Intervention;  Surgeon: [name], MD;  Location: [facility];  Service: Cardiovascular;  Laterality: N/A;  . COLONOSCOPY WITH PROPOFOL N/A 05/30/2014   HUD:JSHFWY colonic polyp-likely source of hematochezia-removed as described above  . ESOPHAGOGASTRODUODENOSCOPY (EGD) WITH PROPOFOL N/A 05/30/2014   OVZ:CHYIFO and bulbar erosions s/p gastric biopsy. No evidence of portal gastropathy on today's examination.  . FEMORAL-TIBIAL BYPASS GRAFT  2009   Right side using non-reversed GSV   By Dr. Oneida Alar  . FEMORAL-TIBIAL BYPASS GRAFT  11/24/2007   Right femoral to anterior tibial BPG   by Dr. Oneida Alar  . FINGER SURGERY Left    "straightened my pinky"  . HAND TENDON SURGERY Left 2013   Left 5th finger  . HERNIA REPAIR     "stomach"  . ICD  IMPLANT N/A 03/02/2018   Procedure: ICD IMPLANT;  Surgeon: [name], MD;  Location: [facility];  Service: Cardiovascular;  Laterality: N/A;  . INCISIONAL HERNIA REPAIR N/A 12/25/2014   Procedure: HERNIA REPAIR INCISIONAL WITH MESH;  Surgeon: [name], MD;  Location: [facility];  Service: General;  Laterality: N/A;  . INSERTION OF MESH N/A 12/25/2014   Procedure: INSERTION OF MESH;  Surgeon: [name], MD;  Location: [facility];  Service: General;  Laterality: N/A;  . IR IMAGING GUIDED PORT INSERTION  04/11/2018  . LAPAROSCOPIC CHOLECYSTECTOMY    . LOWER EXTREMITY ANGIOGRAM Left 12/30/2015   Procedure: Lower Extremity Angiogram;  Surgeon: [name], MD;  Location: [facility];  Service: Cardiovascular;  Laterality: Left;  . PERIPHERAL VASCULAR CATHETERIZATION N/A 12/30/2015   Procedure: Abdominal Aortogram;  Surgeon: [name], MD;  Location: [facility];   Service: Cardiovascular;  Laterality: N/A;  . POLYPECTOMY  05/30/2014   Procedure: POLYPECTOMY;  Surgeon: [name], MD;  Location: [facility];  Service: Endoscopy;;  . TONSILLECTOMY AND ADENOIDECTOMY  ~ 1970  . TYMPANOSTOMY TUBE PLACEMENT Bilateral ~ 1970    REVIEW OF SYSTEMS:  A comprehensive review of systems was negative except for: Respiratory: positive for dyspnea on exertion   PHYSICAL EXAMINATION: General appearance: alert, cooperative and no distress Head: Normocephalic, without obvious abnormality, atraumatic Neck: no adenopathy, no JVD, supple, symmetrical, trachea midline and thyroid not enlarged, symmetric, no tenderness/mass/nodules Lymph nodes: Cervical, supraclavicular, and axillary nodes normal. Resp: clear to auscultation bilaterally Back: symmetric, no curvature. ROM normal. No CVA tenderness. Cardio: regular rate and rhythm, S1, S2 normal, no murmur, click, rub or gallop GI: soft, non-tender; bowel sounds normal; no masses,  no organomegaly Extremities: Right below knee amputation otherwise no edema.  ECOG PERFORMANCE STATUS: 1 - Symptomatic but completely ambulatory  Blood pressure 104/63, pulse 74, temperature 98 F (36.7 C), temperature source Oral, resp. rate 18, height '6\' 1"'$  (1.854 m), weight 233 lb 14.4 oz (106.1 kg), SpO2 100 %.  LABORATORY DATA: Lab Results  Component Value Date   WBC 7.3 05/09/2019   HGB 13.3 05/09/2019   HCT 41.5 05/09/2019   MCV 95.8 05/09/2019   PLT 188 05/09/2019      Chemistry      Component Value Date/Time   NA 137 04/25/2019 0855   NA 139 02/27/2018 1457   K 3.9 04/25/2019 0855   CL 100 04/25/2019 0855   CO2 29 04/25/2019 0855   BUN 11 04/25/2019 0855   BUN 7 02/27/2018 1457   CREATININE 1.10 04/25/2019 0855   CREATININE 1.13 06/28/2016 1453      Component Value Date/Time   CALCIUM 9.0 04/25/2019 0855   ALKPHOS 113 04/25/2019 0855   AST 16 04/25/2019 0855   ALT 20 04/25/2019 0855   BILITOT 0.2 (L) 04/25/2019 0855        RADIOGRAPHIC STUDIES: No results found.  ASSESSMENT AND PLAN: This is a very pleasant 58 years old white male recently diagnosed with a stage IIIa non-small cell lung cancer, adenosquamous carcinoma.  He underwent a course of concurrent chemoradiation with weekly carboplatin and paclitaxel status post 6 cycles with partial response. The patient is currently undergoing treatment with consolidation immunotherapy with Imfinzi status post 22 cycles. The patient continues to tolerate the treatment well with no concerning complaints. I recommended for the patient to continue his treatment and he will proceed with cycle #23 today. I will see him  back for follow-up visit in 2 weeks for evaluation before starting cycle #24. For smoking cessation I strongly advised the patient to quit smoking. The patient was advised to call immediately if he has any concerning symptoms in the interval. The patient voices understanding of current disease status and treatment options and is in agreement with the current care plan. All questions were answered. The patient knows to call the clinic with any problems, questions or concerns. We can certainly see the patient much sooner if necessary. I spent 10 minutes counseling the patient face to face. The total time spent in the appointment was 15 minutes.  Disclaimer: This note was dictated with voice recognition software. Similar sounding words can inadvertently be transcribed and may not be corrected upon review.

## 2019-05-15 DIAGNOSIS — J449 Chronic obstructive pulmonary disease, unspecified: Secondary | ICD-10-CM | POA: Diagnosis not present

## 2019-05-15 DIAGNOSIS — G894 Chronic pain syndrome: Secondary | ICD-10-CM | POA: Diagnosis not present

## 2019-05-21 ENCOUNTER — Telehealth: Payer: Self-pay | Admitting: Internal Medicine

## 2019-05-21 NOTE — Telephone Encounter (Signed)
Pt called in to cancel appt on 8/26 - treatment attached , I tried to call pt back to reschedule but did not get anyone. Left message for patient to call back for reschedule and message sent to MD to let them know pt cancelled.

## 2019-05-22 ENCOUNTER — Telehealth: Payer: Self-pay | Admitting: Physician Assistant

## 2019-05-22 NOTE — Telephone Encounter (Signed)
Called the patient to try to reschedule his cancelled appointment to the week of 06-16-23. The wife had a death in the family and they will be out of town for an undetermined amount of time. We will just keep his appointment for 05/06/2019; however, if the patient returns sooner, then they will call us back to schedule him for sooner.

## 2019-05-23 ENCOUNTER — Inpatient Hospital Stay: Payer: Medicare Other

## 2019-05-23 ENCOUNTER — Inpatient Hospital Stay: Payer: Medicare Other | Admitting: Physician Assistant

## 2019-06-03 ENCOUNTER — Other Ambulatory Visit: Payer: Self-pay | Admitting: Internal Medicine

## 2019-06-06 ENCOUNTER — Inpatient Hospital Stay: Payer: Medicare Other | Attending: Internal Medicine

## 2019-06-06 ENCOUNTER — Inpatient Hospital Stay (HOSPITAL_BASED_OUTPATIENT_CLINIC_OR_DEPARTMENT_OTHER): Payer: Medicare Other | Admitting: Physician Assistant

## 2019-06-06 ENCOUNTER — Inpatient Hospital Stay: Payer: Medicare Other

## 2019-06-06 ENCOUNTER — Ambulatory Visit (INDEPENDENT_AMBULATORY_CARE_PROVIDER_SITE_OTHER): Payer: Medicare Other | Admitting: *Deleted

## 2019-06-06 ENCOUNTER — Other Ambulatory Visit: Payer: Self-pay

## 2019-06-06 VITALS — BP 107/85 | HR 66 | Temp 98.7°F | Resp 18 | Ht 73.0 in | Wt 231.6 lb

## 2019-06-06 DIAGNOSIS — F17210 Nicotine dependence, cigarettes, uncomplicated: Secondary | ICD-10-CM | POA: Insufficient documentation

## 2019-06-06 DIAGNOSIS — Z7982 Long term (current) use of aspirin: Secondary | ICD-10-CM | POA: Diagnosis not present

## 2019-06-06 DIAGNOSIS — C3491 Malignant neoplasm of unspecified part of right bronchus or lung: Secondary | ICD-10-CM

## 2019-06-06 DIAGNOSIS — E7800 Pure hypercholesterolemia, unspecified: Secondary | ICD-10-CM | POA: Insufficient documentation

## 2019-06-06 DIAGNOSIS — Z720 Tobacco use: Secondary | ICD-10-CM

## 2019-06-06 DIAGNOSIS — I255 Ischemic cardiomyopathy: Secondary | ICD-10-CM | POA: Diagnosis not present

## 2019-06-06 DIAGNOSIS — F329 Major depressive disorder, single episode, unspecified: Secondary | ICD-10-CM | POA: Diagnosis not present

## 2019-06-06 DIAGNOSIS — I252 Old myocardial infarction: Secondary | ICD-10-CM | POA: Diagnosis not present

## 2019-06-06 DIAGNOSIS — R7989 Other specified abnormal findings of blood chemistry: Secondary | ICD-10-CM

## 2019-06-06 DIAGNOSIS — Z5112 Encounter for antineoplastic immunotherapy: Secondary | ICD-10-CM | POA: Insufficient documentation

## 2019-06-06 DIAGNOSIS — C3411 Malignant neoplasm of upper lobe, right bronchus or lung: Secondary | ICD-10-CM | POA: Insufficient documentation

## 2019-06-06 DIAGNOSIS — Z86718 Personal history of other venous thrombosis and embolism: Secondary | ICD-10-CM | POA: Diagnosis not present

## 2019-06-06 DIAGNOSIS — Z79899 Other long term (current) drug therapy: Secondary | ICD-10-CM | POA: Diagnosis not present

## 2019-06-06 DIAGNOSIS — J449 Chronic obstructive pulmonary disease, unspecified: Secondary | ICD-10-CM | POA: Insufficient documentation

## 2019-06-06 DIAGNOSIS — F419 Anxiety disorder, unspecified: Secondary | ICD-10-CM | POA: Diagnosis not present

## 2019-06-06 DIAGNOSIS — Z95828 Presence of other vascular implants and grafts: Secondary | ICD-10-CM

## 2019-06-06 LAB — CBC WITH DIFFERENTIAL (CANCER CENTER ONLY)
Abs Immature Granulocytes: 0.11 10*3/uL — ABNORMAL HIGH (ref 0.00–0.07)
Basophils Absolute: 0.1 10*3/uL (ref 0.0–0.1)
Basophils Relative: 1 %
Eosinophils Absolute: 0.4 10*3/uL (ref 0.0–0.5)
Eosinophils Relative: 6 %
HCT: 41.7 % (ref 39.0–52.0)
Hemoglobin: 13.6 g/dL (ref 13.0–17.0)
Immature Granulocytes: 1 %
Lymphocytes Relative: 13 %
Lymphs Abs: 1 10*3/uL (ref 0.7–4.0)
MCH: 31.1 pg (ref 26.0–34.0)
MCHC: 32.6 g/dL (ref 30.0–36.0)
MCV: 95.4 fL (ref 80.0–100.0)
Monocytes Absolute: 0.7 10*3/uL (ref 0.1–1.0)
Monocytes Relative: 9 %
Neutro Abs: 5.5 10*3/uL (ref 1.7–7.7)
Neutrophils Relative %: 70 %
Platelet Count: 196 10*3/uL (ref 150–400)
RBC: 4.37 MIL/uL (ref 4.22–5.81)
RDW: 14.3 % (ref 11.5–15.5)
WBC Count: 7.8 10*3/uL (ref 4.0–10.5)
nRBC: 0 % (ref 0.0–0.2)

## 2019-06-06 LAB — CMP (CANCER CENTER ONLY)
ALT: 32 U/L (ref 0–44)
AST: 25 U/L (ref 15–41)
Albumin: 4.2 g/dL (ref 3.5–5.0)
Alkaline Phosphatase: 126 U/L (ref 38–126)
Anion gap: 6 (ref 5–15)
BUN: 8 mg/dL (ref 6–20)
CO2: 32 mmol/L (ref 22–32)
Calcium: 8.9 mg/dL (ref 8.9–10.3)
Chloride: 99 mmol/L (ref 98–111)
Creatinine: 1.01 mg/dL (ref 0.61–1.24)
GFR, Est AFR Am: >60 mL/min (ref >60)
GFR, Estimated: >60 mL/min (ref >60)
Glucose, Bld: 81 mg/dL (ref 70–99)
Potassium: 4.4 mmol/L (ref 3.5–5.1)
Sodium: 137 mmol/L (ref 135–145)
Total Bilirubin: 0.3 mg/dL (ref 0.3–1.2)
Total Protein: 7.1 g/dL (ref 6.5–8.1)

## 2019-06-06 LAB — TSH: TSH: 18.347 u[IU]/mL — ABNORMAL HIGH (ref 0.320–4.118)

## 2019-06-06 MED ORDER — SODIUM CHLORIDE 0.9% FLUSH
10.0000 mL | INTRAVENOUS | Status: DC | PRN
  Administered 2019-06-06: 10 mL
  Filled 2019-06-06: qty 10

## 2019-06-06 MED ORDER — SODIUM CHLORIDE 0.9 % IV SOLN
1000.0000 mg | Freq: Once | INTRAVENOUS | Status: AC
  Administered 2019-06-06: 1000 mg via INTRAVENOUS
  Filled 2019-06-06: qty 20

## 2019-06-06 MED ORDER — HEPARIN SOD (PORK) LOCK FLUSH 100 UNIT/ML IV SOLN
500.0000 [IU] | Freq: Once | INTRAVENOUS | Status: AC | PRN
  Administered 2019-06-06: 500 [IU]
  Filled 2019-06-06: qty 5

## 2019-06-06 MED ORDER — SODIUM CHLORIDE 0.9 % IV SOLN
Freq: Once | INTRAVENOUS | Status: AC
  Administered 2019-06-06: 15:00:00 via INTRAVENOUS
  Filled 2019-06-06: qty 250

## 2019-06-06 MED ORDER — SODIUM CHLORIDE 0.9% FLUSH
10.0000 mL | Freq: Once | INTRAVENOUS | Status: AC
  Administered 2019-06-06: 10 mL
  Filled 2019-06-06: qty 10

## 2019-06-06 NOTE — Progress Notes (Signed)
[facility] OFFICE PROGRESS NOTE  Redmond School, MD [address] [place] [address]  DIAGNOSIS: Stage IIIA (T3, N2, M0) non-small cell lung cancer, adenosquamous carcinoma diagnosed in June 2019 and presented with large right upper lobe lung mass with questionable chest wall invasion as well as right hilar and mediastinal lymphadenopathy.  Biomarker Findings Tumor Mutational Burden - TMB-High (21 Muts/Mb) Microsatellite status - MS-Stable Genomic Findings For a complete list of the genes assayed, please refer to the Appendix. ATM Q3009* CCND2 P281R KRAS G12V CHEK2 T368f*15 TP53 E298* 7 Disease relevant genes with no reportable alterations: ALK, EGFR, BRAF, MET, RET, ERBB2, ROS1  PRIOR THERAPY: Concurrent chemoradiation with chemotherapy consisting of weekly carboplatin for an AUC of 2 and paclitaxel 45 mg/m2. First dose given on 04/03/2018.Status post 6 cycles. Last dose was giving 05/15/2018 with partial response  CURRENT THERAPY: Consolidation treatment with immunotherapy with Imfinzi (Durvalumab) 10 mg/KG every 2 weeks. First dose 06/20/2018. Status post 23cycles  INTERVAL HISTORY: Mark GIRAUD89y.o. male returns to the clinic for a follow-up visit.  The patient is feeling well today without any concerning complaints.  He continues to tolerate his treatment with immunotherapy with Imfinzi well without any adverse effects.  He denies any fever, chills, night sweats, or weight loss.  He reports his baseline shortness of breath and cough but denies any chest pain or hemoptysis.  Patient continues to smoke cigarettes which contributes to his respiratory complaints.  Denies any nausea, vomiting, or diarrhea.  He reports his baseline constipation for which he takes over-the-counter stool softener daily which reportedly works well for him.  He denies any headache or visual changes.  He denies any rashes or skin changes.  He is here today for evaluation before  starting cycle #24.  MEDICAL HISTORY: Past Medical History:  Diagnosis Date  . Acute hepatitis C virus infection 06/19/2007   Qualifier: Diagnosis of  By: MLeilani MerlCMA, Tiffany    . Acute ST elevation myocardial infarction (STEMI) involving left anterior descending (LAD) coronary artery ([facility] 05/26/2016  . Acute ST elevation myocardial infarction (STEMI) of lateral wall ([facility] 05/26/2016  . 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 Anxiety   . Asthma   . Atherosclerosis of native arteries of the extremities with intermittent claudication 12/16/2011  . Chronic hepatitis C without hepatic coma ([facility] 05/03/2014  . COPD with chronic bronchitis and emphysema ([facility] 03/13/2018   FEV1 57%  . Depression   . DVT (deep venous thrombosis) ([facility] 2009; 2019   RLE; LLE  . Encounter for antineoplastic chemotherapy 03/22/2018  . Hepatitis C    "tx'd in 2015"  . High cholesterol   . Ischemic cardiomyopathy 03/02/2018  . Non-small cell carcinoma of right lung, stage 3 ([facility] 03/22/2018  . PAD (peripheral artery disease) ([facility] 01/25/2013  . Peripheral vascular disease, unspecified 07/20/2012  . Port-A-Cath in place 04/24/2018  . ST elevation myocardial infarction involving left anterior descending (LAD) coronary artery ([facility]   . STEMI (ST elevation myocardial infarction) ([facility] 05/26/2016  . Tobacco abuse 01/28/2016  . Tubular adenoma of colon 07/11/2014  . Urinary dribbling     ALLERGIES:  is allergic to lyrica [pregabalin] and neurontin [gabapentin].  MEDICATIONS:  Current Outpatient Medications  Medication Sig Dispense Refill  . alprazolam (XANAX) 2 MG tablet Take 2 mg by mouth 4 (four) times daily as needed for anxiety.     .Marland Kitchenamitriptyline (ELAVIL) 100 MG tablet Take 100 mg by mouth at bedtime.     .[name]  aspirin 81 MG EC tablet Take 81 mg by mouth daily.      [name] atorvastatin (LIPITOR) 80 MG tablet Take 1 tablet (80 mg total) by mouth daily at 6 PM. 30 tablet 12  . carvedilol (COREG) 6.25 MG  tablet TAKE 1 TABLET BY MOUTH TWICE A DAY WITH A MEAL 60 tablet 8  . citalopram (CELEXA) 40 MG tablet Take 40 mg by mouth daily.  11  . HYDROcodone-acetaminophen (NORCO) 10-325 MG tablet Take 1 tablet by mouth every 4 (four) hours as needed for moderate pain.   0  . levETIRAcetam (KEPPRA) 750 MG tablet Take 1 tablet (750 mg total) by mouth 2 (two) times daily. 60 tablet 5  . levothyroxine (SYNTHROID) 125 MCG tablet One tab po daily. 30 tablet 1  . lidocaine-prilocaine (EMLA) cream Apply 1 application topically as needed. 30 g 0  . lisinopril (PRINIVIL,ZESTRIL) 2.5 MG tablet Take 1 tablet (2.5 mg total) by mouth daily. 30 tablet 12  . prochlorperazine (COMPAZINE) 10 MG tablet Take 1 tablet (10 mg total) by mouth every 6 (six) hours as needed for nausea or vomiting. 30 tablet 0  . spironolactone (ALDACTONE) 25 MG tablet TAKE 1 TABLET BY MOUTH EVERY DAY 90 tablet 0  . sucralfate (CARAFATE) 1 g tablet Take 1 tablet (1 g total) by mouth 4 (four) times daily -  with meals and at bedtime. 5 min before meals for radiation induced esophagitis 120 tablet 2  . tamsulosin (FLOMAX) 0.4 MG CAPS capsule Take 1 capsule by mouth daily.    . Tiotropium Bromide-Olodaterol (STIOLTO RESPIMAT) 2.5-2.5 MCG/ACT AERS Inhale 2 puffs into the lungs daily. 1 Inhaler 5  . umeclidinium-vilanterol (ANORO ELLIPTA) 62.5-25 MCG/INH AEPB Inhale 1 puff into the lungs daily. 1 each 0  . furosemide (LASIX) 20 MG tablet Take 1 tablet (20 mg total) by mouth daily. 90 tablet 3  . nitroGLYCERIN (NITROSTAT) 0.4 MG SL tablet Place 1 tablet (0.4 mg total) under the tongue every 5 (five) minutes x 3 doses as needed for chest pain. (Patient not taking: Reported on 03/14/2019) 25 tablet 2   No current facility-administered medications for this visit.    Facility-Administered Medications Ordered in Other Visits  Medication Dose Route Frequency Provider Last Rate Last Dose  . durvalumab (IMFINZI) 1,000 mg in sodium chloride 0.9 % 100 mL chemo  infusion  1,000 mg Intravenous Once Curt Bears, MD 120 mL/hr at 06/06/19 1605 1,000 mg at 06/06/19 1605  . heparin lock flush 100 unit/mL  500 Units Intracatheter Once PRN Curt Bears, MD      . sodium chloride flush (NS) 0.9 % injection 10 mL  10 mL Intracatheter PRN Curt Bears, MD        SURGICAL HISTORY:  Past Surgical History:  Procedure Laterality Date  . AORTOGRAM  08/08/2009   for LLE claudication     By Dr. Oneida Alar  . 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 BIOPSY N/A 05/30/2014   Procedure: BIOPSY;  Surgeon: [name], MD;  Location: [facility];  Service: Endoscopy;  Laterality: N/A;  . BLADDER TUMOR EXCISION  8/812  . CARDIAC CATHETERIZATION N/A 05/26/2016   Procedure: Left Heart Cath and Coronary Angiography;  Surgeon: [name], MD;  Location: [facility];  Service: Cardiovascular;  Laterality: N/A;  . CARDIAC CATHETERIZATION N/A 05/26/2016   Procedure: Coronary Stent Intervention;  Surgeon: [name], MD;  Location: [facility];  Service: Cardiovascular;  Laterality: N/A;  . COLONOSCOPY  WITH PROPOFOL N/A 05/30/2014   TGP:QDIYME colonic polyp-likely source of hematochezia-removed as described above  . ESOPHAGOGASTRODUODENOSCOPY (EGD) WITH PROPOFOL N/A 05/30/2014   BRA:XENMMH and bulbar erosions s/p gastric biopsy. No evidence of portal gastropathy on today's examination.  . FEMORAL-TIBIAL BYPASS GRAFT  2009   Right side using non-reversed GSV   By Dr. Oneida Alar  . FEMORAL-TIBIAL BYPASS GRAFT  11/24/2007   Right femoral to anterior tibial BPG   by Dr. Oneida Alar  . FINGER SURGERY Left    "straightened my pinky"  . HAND TENDON SURGERY Left 2013   Left 5th finger  . HERNIA REPAIR     "stomach"  . ICD IMPLANT N/A 03/02/2018   Procedure: ICD IMPLANT;  Surgeon: [name], MD;  Location: [facility];  Service: Cardiovascular;  Laterality: N/A;  . INCISIONAL HERNIA REPAIR N/A 12/25/2014   Procedure: HERNIA REPAIR INCISIONAL WITH  MESH;  Surgeon: [name], MD;  Location: [facility];  Service: General;  Laterality: N/A;  . INSERTION OF MESH N/A 12/25/2014   Procedure: INSERTION OF MESH;  Surgeon: [name], MD;  Location: [facility];  Service: General;  Laterality: N/A;  . IR IMAGING GUIDED PORT INSERTION  04/11/2018  . LAPAROSCOPIC CHOLECYSTECTOMY    . LOWER EXTREMITY ANGIOGRAM Left 12/30/2015   Procedure: Lower Extremity Angiogram;  Surgeon: [name], MD;  Location: [facility];  Service: Cardiovascular;  Laterality: Left;  . PERIPHERAL VASCULAR CATHETERIZATION N/A 12/30/2015   Procedure: Abdominal Aortogram;  Surgeon: [name], MD;  Location: [facility];  Service: Cardiovascular;  Laterality: N/A;  . POLYPECTOMY  05/30/2014   Procedure: POLYPECTOMY;  Surgeon: [name], MD;  Location: [facility];  Service: Endoscopy;;  . TONSILLECTOMY AND ADENOIDECTOMY  ~ 1970  . TYMPANOSTOMY TUBE PLACEMENT Bilateral ~ 1970    REVIEW OF SYSTEMS:   Review of Systems  Constitutional: Negative for appetite change, chills, fatigue, fever and unexpected weight change.  HENT: Negative for mouth sores, nosebleeds, sore throat and trouble swallowing.   Eyes: Negative for eye problems and icterus.  Respiratory: Positive for baseline cough and shortness of breath. Negative for hemoptysis and wheezing.   Cardiovascular: Negative for chest pain and leg swelling.  Gastrointestinal: Negative for abdominal pain, constipation, diarrhea, nausea and vomiting.  Genitourinary: Negative for bladder incontinence, difficulty urinating, dysuria, frequency and hematuria.   Musculoskeletal: Negative for back pain, gait problem, neck pain and neck stiffness.  Skin: Negative for itching and rash.  Neurological: Negative for dizziness, extremity weakness, gait problem, headaches, light-headedness and seizures.  Hematological: Negative for adenopathy. Does not bruise/bleed easily.  Psychiatric/Behavioral: Negative for confusion,  depression and sleep disturbance. The patient is not nervous/anxious.     PHYSICAL EXAMINATION:  Blood pressure 107/85, pulse 66, temperature 98.7 F (37.1 C), resp. rate 18, height '6\' 1"'$  (1.854 m), weight 231 lb 9.6 oz (105.1 kg), SpO2 98 %.  ECOG PERFORMANCE STATUS: 1 - Symptomatic but completely ambulatory  Physical Exam  Constitutional: Oriented to person, place, and time and well-developed, well-nourished, and in no distress.  HENT:  Head: Normocephalic and atraumatic.  Mouth/Throat: Oropharynx is clear and moist. No oropharyngeal exudate.  Eyes: Conjunctivae are normal. Right eye exhibits no discharge. Left eye exhibits no discharge. No scleral icterus.  Neck: Normal range of motion. Neck supple.  Cardiovascular: Normal rate, regular rhythm, normal heart sounds and intact distal pulses.   Pulmonary/Chest: Diffuse wheezing in all lung fields. Effort normal and breath sounds normal.  No respiratory distress. No rales.  Abdominal: Soft. Bowel sounds are normal. Exhibits no distension and no mass. There is no tenderness.  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 Normal range of motion. Exhibits no edema.  Lymphadenopathy:    No cervical adenopathy.  Neurological: Alert and oriented to person, place, and time. Exhibits normal muscle tone. Gait normal. Coordination normal.  Skin: Skin is warm and dry. No rash noted. Not diaphoretic. No erythema. No pallor.  Psychiatric: Mood, memory and judgment normal.  Vitals reviewed.  LABORATORY DATA: Lab Results  Component Value Date   WBC 7.8 06/06/2019   HGB 13.6 06/06/2019   HCT 41.7 06/06/2019   MCV 95.4 06/06/2019   PLT 196 06/06/2019      Chemistry      Component Value Date/Time   NA 137 06/06/2019 1415   NA 139 02/27/2018 1457   K 4.4 06/06/2019 1415   CL 99 06/06/2019 1415   CO2 32 06/06/2019 1415   BUN 8 06/06/2019 1415   BUN 7 02/27/2018 1457   CREATININE 1.01 06/06/2019 1415   CREATININE 1.13 06/28/2016 1453       Component Value Date/Time   CALCIUM 8.9 06/06/2019 1415   ALKPHOS 126 06/06/2019 1415   AST 25 06/06/2019 1415   ALT 32 06/06/2019 1415   BILITOT 0.3 06/06/2019 1415       RADIOGRAPHIC STUDIES:  No results found.   ASSESSMENT/PLAN:  This is a very pleasant 58 year old Caucasian male with stage IIIa non-small cell lung cancer, adenosquamous carcinoma.  He presented with a large right upper lobe lung mass with questionable chest wall invasion as well as a right hilar and mediastinal lymphadenopathy.  He was diagnosed in June 2019.  He completed 6 cycles of concurrent chemoradiation with carboplatin and paclitaxel.  He had a partial response to treatment.  The patient is currently undergoing consolidation immunotherapy with Imfinzi 10 mg/kg IV every 2 weeks.  He is status post 23 cycles.  He is tolerating treatment well without any adverse side effects.  Labs were reviewed with the patient today.  I recommend that he proceed with cycle #24 today as scheduled.  I will see the patient back for follow-up visit in 2 weeks for evaluation before starting cycle #25.  The patient was strongly encouraged to quit smoking. The patient is not interested in smoking cessation at this time.   He will continue taking his stool softener for constipation.   We will continue to monitor his TSH routine labs and make dose adjustments of his synthroid if indicated.  The patient was advised to call immediately if he has any concerning symptoms in the interval. The patient voices understanding of current disease status and treatment options and is in agreement with the current care plan. All questions were answered. The patient knows to call the clinic with any problems, questions or concerns. We can certainly see the patient much sooner if necessary   Orders Placed This Encounter  Procedures  . TSH    Standing Status:   Standing    Number of Occurrences:   1    Standing Expiration Date:    06/05/2020     Annika Selke L Kanoe Wanner, PA-C 06/06/19

## 2019-06-06 NOTE — Patient Instructions (Signed)
[facility] Discharge Instructions for Patients Receiving Chemotherapy  Today you received the following Immunotherapy: Imfinzi  To help prevent nausea and vomiting after your treatment, we encourage you to take your nausea medication as directed by your MD.   If you develop nausea and vomiting that is not controlled by your nausea medication, call the clinic.   BELOW ARE SYMPTOMS THAT SHOULD BE REPORTED IMMEDIATELY:  *FEVER GREATER THAN 100.5 F  *CHILLS WITH OR WITHOUT FEVER  NAUSEA AND VOMITING THAT IS NOT CONTROLLED WITH YOUR NAUSEA MEDICATION  *UNUSUAL SHORTNESS OF BREATH  *UNUSUAL BRUISING OR BLEEDING  TENDERNESS IN MOUTH AND THROAT WITH OR WITHOUT PRESENCE OF ULCERS  *URINARY PROBLEMS  *BOWEL PROBLEMS  UNUSUAL RASH Items with * indicate a potential emergency and should be followed up as soon as possible.  Feel free to call the clinic should you have any questions or concerns. The clinic phone number is [phone].  Please show the [facility] at check-in to the Emergency Department and triage nurse. Coronavirus (COVID-19) Are you at risk?  Are you at risk for the Coronavirus (COVID-19)?  To be considered HIGH RISK for Coronavirus (COVID-19), you have to meet the following criteria:  . Traveled to [place], [place], [place], [place] or [place]; or in the [place] to [place], [place], [place], or [place]; and have fever, cough, and shortness of breath within the last 2 weeks of travel OR . Been in close contact with a person diagnosed with COVID-19 within the last 2 weeks and have fever, cough, and shortness of breath . IF YOU DO NOT MEET THESE CRITERIA, YOU ARE CONSIDERED LOW RISK FOR COVID-19.  What to do if you are HIGH RISK for COVID-19?  [name] If you are having a medical emergency, call 911. . Seek medical care right away. Before you go to a doctor's office, urgent care or emergency department, call ahead and tell them about your  recent travel, contact with someone diagnosed with COVID-19, and your symptoms. You should receive instructions from your physician's office regarding next steps of care.  . When you arrive at healthcare provider, tell the healthcare staff immediately you have returned from visiting [place], [place], [place], [place] or [place]; or traveled in the [place] to [place], [place], [place], or [place]; in the last two weeks or you have been in close contact with a person diagnosed with COVID-19 in the last 2 weeks.   . Tell the health care staff about your symptoms: fever, cough and shortness of breath. . After you have been seen by a medical provider, you will be either: o Tested for (COVID-19) and discharged home on quarantine except to seek medical care if symptoms worsen, and asked to  - Stay home and avoid contact with others until you get your results (4-5 days)  - Avoid travel on public transportation if possible (such as bus, train, or airplane) or o Sent to the Emergency Department by EMS for evaluation, COVID-19 testing, and possible admission depending on your condition and test results.  What to do if you are LOW RISK for COVID-19?  Reduce your risk of any infection by using the same precautions used for avoiding the common cold or flu:  [name] Wash your hands often with soap and warm water for at least 20 seconds.  If soap and water are not readily available, use an alcohol-based hand sanitizer with at least 60% alcohol.  . If coughing or  sneezing, cover your mouth and nose by coughing or sneezing into the elbow areas of your shirt or coat, into a tissue or into your sleeve (not your hands). . Avoid shaking hands with others and consider head nods or verbal greetings only. . Avoid touching your eyes, nose, or mouth with unwashed hands.  . Avoid close contact with people who are sick. . Avoid places or events with large numbers of people in one location, like concerts or sporting  events. . Carefully consider travel plans you have or are making. . If you are planning any travel outside or inside the [place], visit the CDC's Travelers' Health webpage for the latest health notices. . If you have some symptoms but not all symptoms, continue to monitor at home and seek medical attention if your symptoms worsen. . If you are having a medical emergency, call 911.   [facility] / e-Visit: [email or web address]         [facility]: [facility]: [phone]                   [facility]: [phone]

## 2019-06-07 ENCOUNTER — Telehealth: Payer: Self-pay | Admitting: Internal Medicine

## 2019-06-07 LAB — CUP PACEART REMOTE DEVICE CHECK
Battery Remaining Longevity: 130 mo
Battery Voltage: 3.01 V
Brady Statistic RV Percent Paced: 0.01 %
Date Time Interrogation Session: 20200909062823
HighPow Impedance: 74 Ohm
Implantable Lead Implant Date: 20190606
Implantable Lead Location: 753860
Implantable Pulse Generator Implant Date: 20190606
Lead Channel Impedance Value: 361 Ohm
Lead Channel Impedance Value: 475 Ohm
Lead Channel Pacing Threshold Amplitude: 0.75 V
Lead Channel Pacing Threshold Pulse Width: 0.4 ms
Lead Channel Sensing Intrinsic Amplitude: 23.25 mV
Lead Channel Sensing Intrinsic Amplitude: 23.25 mV
Lead Channel Setting Pacing Amplitude: 2.5 V
Lead Channel Setting Pacing Pulse Width: 0.4 ms
Lead Channel Setting Sensing Sensitivity: 0.3 mV

## 2019-06-07 NOTE — Telephone Encounter (Signed)
Scheduled appt per 9/09 los - pt to get an updated schedule next visit.

## 2019-06-08 ENCOUNTER — Other Ambulatory Visit: Payer: Self-pay | Admitting: Cardiovascular Disease

## 2019-06-08 MED ORDER — FUROSEMIDE 20 MG PO TABS: 20.0000 mg | ORAL_TABLET | Freq: Every day | ORAL | 0 refills | Status: DC

## 2019-06-12 DIAGNOSIS — J449 Chronic obstructive pulmonary disease, unspecified: Secondary | ICD-10-CM | POA: Diagnosis not present

## 2019-06-12 DIAGNOSIS — G894 Chronic pain syndrome: Secondary | ICD-10-CM | POA: Diagnosis not present

## 2019-06-12 DIAGNOSIS — S40212A Abrasion of left shoulder, initial encounter: Secondary | ICD-10-CM | POA: Diagnosis not present

## 2019-06-19 ENCOUNTER — Other Ambulatory Visit: Payer: Self-pay | Admitting: *Deleted

## 2019-06-19 DIAGNOSIS — C3491 Malignant neoplasm of unspecified part of right bronchus or lung: Secondary | ICD-10-CM

## 2019-06-19 NOTE — Progress Notes (Signed)
Labs entered.

## 2019-06-20 ENCOUNTER — Inpatient Hospital Stay: Payer: Medicare Other

## 2019-06-20 ENCOUNTER — Inpatient Hospital Stay (HOSPITAL_BASED_OUTPATIENT_CLINIC_OR_DEPARTMENT_OTHER): Payer: Medicare Other | Admitting: Internal Medicine

## 2019-06-20 ENCOUNTER — Encounter: Payer: Self-pay | Admitting: Internal Medicine

## 2019-06-20 ENCOUNTER — Encounter: Payer: Self-pay | Admitting: *Deleted

## 2019-06-20 ENCOUNTER — Other Ambulatory Visit: Payer: Self-pay | Admitting: *Deleted

## 2019-06-20 ENCOUNTER — Other Ambulatory Visit: Payer: Self-pay

## 2019-06-20 ENCOUNTER — Telehealth: Payer: Self-pay | Admitting: Physician Assistant

## 2019-06-20 VITALS — BP 140/82 | HR 63 | Temp 98.0°F | Resp 17 | Ht 73.0 in | Wt 234.5 lb

## 2019-06-20 DIAGNOSIS — E7800 Pure hypercholesterolemia, unspecified: Secondary | ICD-10-CM | POA: Diagnosis not present

## 2019-06-20 DIAGNOSIS — Z79899 Other long term (current) drug therapy: Secondary | ICD-10-CM | POA: Diagnosis not present

## 2019-06-20 DIAGNOSIS — Z95828 Presence of other vascular implants and grafts: Secondary | ICD-10-CM

## 2019-06-20 DIAGNOSIS — Z7982 Long term (current) use of aspirin: Secondary | ICD-10-CM | POA: Diagnosis not present

## 2019-06-20 DIAGNOSIS — Z5112 Encounter for antineoplastic immunotherapy: Secondary | ICD-10-CM

## 2019-06-20 DIAGNOSIS — J449 Chronic obstructive pulmonary disease, unspecified: Secondary | ICD-10-CM | POA: Diagnosis not present

## 2019-06-20 DIAGNOSIS — C3491 Malignant neoplasm of unspecified part of right bronchus or lung: Secondary | ICD-10-CM | POA: Diagnosis not present

## 2019-06-20 DIAGNOSIS — R7989 Other specified abnormal findings of blood chemistry: Secondary | ICD-10-CM

## 2019-06-20 DIAGNOSIS — I252 Old myocardial infarction: Secondary | ICD-10-CM | POA: Diagnosis not present

## 2019-06-20 DIAGNOSIS — Z86718 Personal history of other venous thrombosis and embolism: Secondary | ICD-10-CM | POA: Diagnosis not present

## 2019-06-20 DIAGNOSIS — C3411 Malignant neoplasm of upper lobe, right bronchus or lung: Secondary | ICD-10-CM | POA: Diagnosis not present

## 2019-06-20 LAB — CBC WITH DIFFERENTIAL (CANCER CENTER ONLY)
Abs Immature Granulocytes: 0.12 10*3/uL — ABNORMAL HIGH (ref 0.00–0.07)
Basophils Absolute: 0.1 10*3/uL (ref 0.0–0.1)
Basophils Relative: 1 %
Eosinophils Absolute: 0.5 10*3/uL (ref 0.0–0.5)
Eosinophils Relative: 6 %
HCT: 42.9 % (ref 39.0–52.0)
Hemoglobin: 13.8 g/dL (ref 13.0–17.0)
Immature Granulocytes: 2 %
Lymphocytes Relative: 10 %
Lymphs Abs: 0.8 10*3/uL (ref 0.7–4.0)
MCH: 30.7 pg (ref 26.0–34.0)
MCHC: 32.2 g/dL (ref 30.0–36.0)
MCV: 95.3 fL (ref 80.0–100.0)
Monocytes Absolute: 0.6 10*3/uL (ref 0.1–1.0)
Monocytes Relative: 7 %
Neutro Abs: 6.1 10*3/uL (ref 1.7–7.7)
Neutrophils Relative %: 74 %
Platelet Count: 200 10*3/uL (ref 150–400)
RBC: 4.5 MIL/uL (ref 4.22–5.81)
RDW: 14.4 % (ref 11.5–15.5)
WBC Count: 8.2 10*3/uL (ref 4.0–10.5)
nRBC: 0 % (ref 0.0–0.2)

## 2019-06-20 LAB — CMP (CANCER CENTER ONLY)
ALT: 33 U/L (ref 0–44)
AST: 27 U/L (ref 15–41)
Albumin: 4 g/dL (ref 3.5–5.0)
Alkaline Phosphatase: 128 U/L — ABNORMAL HIGH (ref 38–126)
Anion gap: 8 (ref 5–15)
BUN: 12 mg/dL (ref 6–20)
CO2: 32 mmol/L (ref 22–32)
Calcium: 8.9 mg/dL (ref 8.9–10.3)
Chloride: 95 mmol/L — ABNORMAL LOW (ref 98–111)
Creatinine: 1.12 mg/dL (ref 0.61–1.24)
GFR, Est AFR Am: >60 mL/min (ref >60)
GFR, Estimated: >60 mL/min (ref >60)
Glucose, Bld: 105 mg/dL — ABNORMAL HIGH (ref 70–99)
Potassium: 4.6 mmol/L (ref 3.5–5.1)
Sodium: 135 mmol/L (ref 135–145)
Total Bilirubin: 0.3 mg/dL (ref 0.3–1.2)
Total Protein: 7 g/dL (ref 6.5–8.1)

## 2019-06-20 LAB — TSH: TSH: 35.092 u[IU]/mL — ABNORMAL HIGH (ref 0.320–4.118)

## 2019-06-20 MED ORDER — HEPARIN SOD (PORK) LOCK FLUSH 100 UNIT/ML IV SOLN
500.0000 [IU] | Freq: Once | INTRAVENOUS | Status: AC | PRN
  Administered 2019-06-20: 500 [IU]
  Filled 2019-06-20: qty 5

## 2019-06-20 MED ORDER — SODIUM CHLORIDE 0.9% FLUSH
10.0000 mL | Freq: Once | INTRAVENOUS | Status: AC
  Administered 2019-06-20: 10 mL
  Filled 2019-06-20: qty 10

## 2019-06-20 MED ORDER — SODIUM CHLORIDE 0.9 % IV SOLN
Freq: Once | INTRAVENOUS | Status: AC
  Administered 2019-06-20: 12:00:00 via INTRAVENOUS
  Filled 2019-06-20: qty 250

## 2019-06-20 MED ORDER — SODIUM CHLORIDE 0.9% FLUSH
10.0000 mL | INTRAVENOUS | Status: DC | PRN
  Administered 2019-06-20: 10 mL
  Filled 2019-06-20: qty 10

## 2019-06-20 MED ORDER — LEVOTHYROXINE SODIUM 125 MCG PO TABS: ORAL_TABLET | ORAL | 2 refills | Status: DC

## 2019-06-20 MED ORDER — SODIUM CHLORIDE 0.9 % IV SOLN
9.6000 mg/kg | Freq: Once | INTRAVENOUS | Status: AC
  Administered 2019-06-20: 1000 mg via INTRAVENOUS
  Filled 2019-06-20: qty 20

## 2019-06-20 NOTE — Telephone Encounter (Signed)
I was calling the patient regarding his lab work from today. I left a voicemail instructing the patient to call us back. I wanted to confirm that the patient is taking his synthroid daily as prescribed. I also wanted to ensure that the patient is taking the correct dose of his synthroid, as he has in the past taken an old prescription of 88 mcg instead of the prescribed 125 mcg.

## 2019-06-20 NOTE — Progress Notes (Signed)
Oncology Nurse Navigator Documentation  Oncology Nurse Navigator Flowsheets 06/20/2019  Navigator Location [facility]  Navigator Encounter Type Clinic/MDC/I spoke with Mr. Kinlaw today.  I asked if he would like help quitting smoking at this time. He is not interested.  I listened as he explained he is unwilling to quit at this time.  I help to educate to think about what we can do to help him.    Telephone -  Abnormal Finding Date -  Confirmed Diagnosis Date -  Treatment Initiated Date -  Patient Visit Type MedOnc  Treatment Phase Treatment  Barriers/Navigation Needs Education  Education Smoking cessation  Interventions Education  Coordination of Care -  Education Method Verbal  Acuity Level 2-Minimal Needs (1-2 Barriers Identified)  Time Spent with Patient 15

## 2019-06-20 NOTE — Progress Notes (Signed)
[place] [phone]   [phone]  OFFICE PROGRESS NOTE  [employer] [employer], MD [place] [address]  DIAGNOSIS:stage IIIA (T3, N2, M0) non-small cell lung cancer, adenosquamous carcinoma diagnosed in June 2019 and presented with large right upper lobe lung mass with questionable chest wall invasion as well as right hilar and mediastinal lymphadenopathy.  Biomarker Findings Tumor Mutational Burden - TMB-High (21 Muts/Mb) Microsatellite status - MS-Stable Genomic Findings For a complete list of the genes assayed, please refer to the Appendix. ATM I3254* CCND2 P281R KRAS G12V CHEK2 T372f*15 TP53 E298* 7 Disease relevant genes with no reportable alterations: ALK, EGFR, BRAF, MET, RET, ERBB2, ROS1  PRIOR THERAPY:Concurrent chemoradiation with chemotherapy consisting of weekly carboplatin for an AUC of 2 and paclitaxel 45 mg/m2. First dose given on 04/03/2018.  Status post 6 cycles.  Last dose was giving 05/15/2018 with partial response.  CURRENT THERAPY:Consolidation treatment with immunotherapy with Imfinzi (Durvalumab) 10 mg/KG every 2 weeks.  First dose 06/20/2018.  Status post 24 cycles.  INTERVAL HISTORY: Mark HARVEL89y.o. male returns to the clinic today for follow-up visit.  The patient is feeling fine today with no concerning complaints except for recent fall with some bruises on has right face.  He denied having any current chest pain, shortness of breath, cough or hemoptysis.  He denied having any fever or chills.  He has no nausea, vomiting, diarrhea or constipation.  He has no headache or visual changes.  He is here today for evaluation before starting cycle #25 of his treatment.   MEDICAL HISTORY: Past Medical History:  Diagnosis Date  . Acute hepatitis C virus infection 06/19/2007   Qualifier: Diagnosis of  By: MLeilani MerlCMA, Tiffany    . Acute ST elevation myocardial infarction (STEMI) involving left  anterior descending (LAD) coronary artery ([facility] 05/26/2016  . Acute ST elevation myocardial infarction (STEMI) of lateral wall ([facility] 05/26/2016  . 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 Anxiety   . Asthma   . Atherosclerosis of native arteries of the extremities with intermittent claudication 12/16/2011  . Chronic hepatitis C without hepatic coma ([facility] 05/03/2014  . COPD with chronic bronchitis and emphysema ([facility] 03/13/2018   FEV1 57%  . Depression   . DVT (deep venous thrombosis) ([facility] 2009; 2019   RLE; LLE  . Encounter for antineoplastic chemotherapy 03/22/2018  . Hepatitis C    "tx'd in 2015"  . High cholesterol   . Ischemic cardiomyopathy 03/02/2018  . Non-small cell carcinoma of right lung, stage 3 ([facility] 03/22/2018  . PAD (peripheral artery disease) ([facility] 01/25/2013  . Peripheral vascular disease, unspecified 07/20/2012  . Port-A-Cath in place 04/24/2018  . ST elevation myocardial infarction involving left anterior descending (LAD) coronary artery ([facility]   . STEMI (ST elevation myocardial infarction) ([facility] 05/26/2016  . Tobacco abuse 01/28/2016  . Tubular adenoma of colon 07/11/2014  . Urinary dribbling     ALLERGIES:  is allergic to lyrica [pregabalin] and neurontin [gabapentin].  MEDICATIONS:  Current Outpatient Medications  Medication Sig Dispense Refill  . alprazolam (XANAX) 2 MG tablet Take 2 mg by mouth 4 (four) times daily as needed for anxiety.     .Marland Kitchenamitriptyline (ELAVIL) 100 MG tablet Take 100 mg by mouth at bedtime.     .Marland Kitchenaspirin 81 MG EC tablet Take 81 mg by mouth daily.      .Marland Kitchenatorvastatin (LIPITOR) 80 MG tablet Take 1 tablet (80 mg total) by mouth  daily at 6 PM. 30 tablet 12  . carvedilol (COREG) 6.25 MG tablet TAKE 1 TABLET BY MOUTH TWICE A DAY WITH A MEAL 60 tablet 8  . citalopram (CELEXA) 40 MG tablet Take 40 mg by mouth daily.  11  . furosemide (LASIX) 20 MG tablet Take 1 tablet (20 mg total) by mouth daily. Please make overdue appt with Dr.  Oval Linsey before anymore refills. 1st attempt 30 tablet 0  . HYDROcodone-acetaminophen (NORCO) 10-325 MG tablet Take 1 tablet by mouth every 4 (four) hours as needed for moderate pain.   0  . levETIRAcetam (KEPPRA) 750 MG tablet TAKE 1 TABLET BY MOUTH TWICE A DAY 60 tablet 5  . levothyroxine (SYNTHROID) 125 MCG tablet One tab po daily. 30 tablet 1  . lidocaine-prilocaine (EMLA) cream Apply 1 application topically as needed. 30 g 0  . lisinopril (PRINIVIL,ZESTRIL) 2.5 MG tablet Take 1 tablet (2.5 mg total) by mouth daily. 30 tablet 12  . nitroGLYCERIN (NITROSTAT) 0.4 MG SL tablet Place 1 tablet (0.4 mg total) under the tongue every 5 (five) minutes x 3 doses as needed for chest pain. (Patient not taking: Reported on 03/14/2019) 25 tablet 2  . prochlorperazine (COMPAZINE) 10 MG tablet Take 1 tablet (10 mg total) by mouth every 6 (six) hours as needed for nausea or vomiting. 30 tablet 0  . spironolactone (ALDACTONE) 25 MG tablet TAKE 1 TABLET BY MOUTH EVERY DAY 90 tablet 0  . sucralfate (CARAFATE) 1 g tablet Take 1 tablet (1 g total) by mouth 4 (four) times daily -  with meals and at bedtime. 5 min before meals for radiation induced esophagitis 120 tablet 2  . tamsulosin (FLOMAX) 0.4 MG CAPS capsule Take 1 capsule by mouth daily.    . Tiotropium Bromide-Olodaterol (STIOLTO RESPIMAT) 2.5-2.5 MCG/ACT AERS Inhale 2 puffs into the lungs daily. 1 Inhaler 5  . umeclidinium-vilanterol (ANORO ELLIPTA) 62.5-25 MCG/INH AEPB Inhale 1 puff into the lungs daily. 1 each 0   No current facility-administered medications for this visit.     SURGICAL HISTORY:  Past Surgical History:  Procedure Laterality Date  . AORTOGRAM  08/08/2009   for LLE claudication     By Dr. Oneida Alar  . 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 BIOPSY N/A 05/30/2014   Procedure: BIOPSY;  Surgeon: [name], MD;  Location: [facility];  Service: Endoscopy;  Laterality: N/A;  . BLADDER TUMOR EXCISION  8/812  . CARDIAC CATHETERIZATION N/A  05/26/2016   Procedure: Left Heart Cath and Coronary Angiography;  Surgeon: [name], MD;  Location: [facility];  Service: Cardiovascular;  Laterality: N/A;  . CARDIAC CATHETERIZATION N/A 05/26/2016   Procedure: Coronary Stent Intervention;  Surgeon: [name], MD;  Location: [facility];  Service: Cardiovascular;  Laterality: N/A;  . COLONOSCOPY WITH PROPOFOL N/A 05/30/2014   JFH:LKTGYB colonic polyp-likely source of hematochezia-removed as described above  . ESOPHAGOGASTRODUODENOSCOPY (EGD) WITH PROPOFOL N/A 05/30/2014   WLS:LHTDSK and bulbar erosions s/p gastric biopsy. No evidence of portal gastropathy on today's examination.  . FEMORAL-TIBIAL BYPASS GRAFT  2009   Right side using non-reversed GSV   By Dr. Oneida Alar  . FEMORAL-TIBIAL BYPASS GRAFT  11/24/2007   Right femoral to anterior tibial BPG   by Dr. Oneida Alar  . FINGER SURGERY Left    "straightened my pinky"  . HAND TENDON SURGERY Left 2013   Left 5th finger  . HERNIA REPAIR     "stomach"  . ICD IMPLANT N/A  03/02/2018   Procedure: ICD IMPLANT;  Surgeon: [name], MD;  Location: [facility];  Service: Cardiovascular;  Laterality: N/A;  . INCISIONAL HERNIA REPAIR N/A 12/25/2014   Procedure: HERNIA REPAIR INCISIONAL WITH MESH;  Surgeon: [name], MD;  Location: [facility];  Service: General;  Laterality: N/A;  . INSERTION OF MESH N/A 12/25/2014   Procedure: INSERTION OF MESH;  Surgeon: [name], MD;  Location: [facility];  Service: General;  Laterality: N/A;  . IR IMAGING GUIDED PORT INSERTION  04/11/2018  . LAPAROSCOPIC CHOLECYSTECTOMY    . LOWER EXTREMITY ANGIOGRAM Left 12/30/2015   Procedure: Lower Extremity Angiogram;  Surgeon: [name], MD;  Location: [facility];  Service: Cardiovascular;  Laterality: Left;  . PERIPHERAL VASCULAR CATHETERIZATION N/A 12/30/2015   Procedure: Abdominal Aortogram;  Surgeon: [name], MD;  Location: [facility];  Service: Cardiovascular;   Laterality: N/A;  . POLYPECTOMY  05/30/2014   Procedure: POLYPECTOMY;  Surgeon: [name], MD;  Location: [facility];  Service: Endoscopy;;  . TONSILLECTOMY AND ADENOIDECTOMY  ~ 1970  . TYMPANOSTOMY TUBE PLACEMENT Bilateral ~ 1970    REVIEW OF SYSTEMS:  A comprehensive review of systems was negative except for: Constitutional: positive for fatigue   PHYSICAL EXAMINATION: General appearance: alert, cooperative and no distress Head: Normocephalic, without obvious abnormality, atraumatic Neck: no adenopathy, no JVD, supple, symmetrical, trachea midline and thyroid not enlarged, symmetric, no tenderness/mass/nodules Lymph nodes: Cervical, supraclavicular, and axillary nodes normal. Resp: clear to auscultation bilaterally Back: symmetric, no curvature. ROM normal. No CVA tenderness. Cardio: regular rate and rhythm, S1, S2 normal, no murmur, click, rub or gallop GI: soft, non-tender; bowel sounds normal; no masses,  no organomegaly Extremities: Right below knee amputation otherwise no edema.  ECOG PERFORMANCE STATUS: 1 - Symptomatic but completely ambulatory  Blood pressure 140/82, pulse 63, temperature 98 F (36.7 C), temperature source Oral, resp. rate 17, height _0  (1.854 m), weight 234 lb 8 oz (106.4 kg), SpO2 95 %.  LABORATORY DATA: Lab Results  Component Value Date   WBC 8.2 06/20/2019   HGB 13.8 06/20/2019   HCT 42.9 06/20/2019   MCV 95.3 06/20/2019   PLT 200 06/20/2019      Chemistry      Component Value Date/Time   NA 137 06/06/2019 1415   NA 139 02/27/2018 1457   K 4.4 06/06/2019 1415   CL 99 06/06/2019 1415   CO2 32 06/06/2019 1415   BUN 8 06/06/2019 1415   BUN 7 02/27/2018 1457   CREATININE 1.01 06/06/2019 1415   CREATININE 1.13 06/28/2016 1453      Component Value Date/Time   CALCIUM 8.9 06/06/2019 1415   ALKPHOS 126 06/06/2019 1415   AST 25 06/06/2019 1415   ALT 32 06/06/2019 1415   BILITOT 0.3 06/06/2019 1415       RADIOGRAPHIC STUDIES: No results  found.  ASSESSMENT AND PLAN: This is a very pleasant 58 years old white male recently diagnosed with a stage IIIa non-small cell lung cancer, adenosquamous carcinoma.  He underwent a course of concurrent chemoradiation with weekly carboplatin and paclitaxel status post 6 cycles with partial response. The patient is currently undergoing treatment with consolidation immunotherapy with Imfinzi status post 24 cycles. The patient continues to tolerate this treatment well with no concerning adverse effects. I recommended for him to proceed with cycle #25 today as planned. We will see him back for follow-up visit in 2 weeks for evaluation  before the last cycle of his consolidation therapy. The patient was advised to call immediately if he has any concerning symptoms in the interval. The patient voices understanding of current disease status and treatment options and is in agreement with the current care plan. All questions were answered. The patient knows to call the clinic with any problems, questions or concerns. We can certainly see the patient much sooner if necessary. I spent 10 minutes counseling the patient face to face. The total time spent in the appointment was 15 minutes.  Disclaimer: This note was dictated with voice recognition software. Similar sounding words can inadvertently be transcribed and may not be corrected upon review.

## 2019-06-20 NOTE — Patient Instructions (Signed)
[facility] Discharge Instructions for Patients Receiving Chemotherapy  Today you received the following Immunotherapy: Imfinzi  To help prevent nausea and vomiting after your treatment, we encourage you to take your nausea medication as directed by your MD.   If you develop nausea and vomiting that is not controlled by your nausea medication, call the clinic.   BELOW ARE SYMPTOMS THAT SHOULD BE REPORTED IMMEDIATELY:  *FEVER GREATER THAN 100.5 F  *CHILLS WITH OR WITHOUT FEVER  NAUSEA AND VOMITING THAT IS NOT CONTROLLED WITH YOUR NAUSEA MEDICATION  *UNUSUAL SHORTNESS OF BREATH  *UNUSUAL BRUISING OR BLEEDING  TENDERNESS IN MOUTH AND THROAT WITH OR WITHOUT PRESENCE OF ULCERS  *URINARY PROBLEMS  *BOWEL PROBLEMS  UNUSUAL RASH Items with * indicate a potential emergency and should be followed up as soon as possible.  Feel free to call the clinic should you have any questions or concerns. The clinic phone number is [phone].  Please show the [facility] at check-in to the Emergency Department and triage nurse. Coronavirus (COVID-19) Are you at risk?  Are you at risk for the Coronavirus (COVID-19)?  To be considered HIGH RISK for Coronavirus (COVID-19), you have to meet the following criteria:  . Traveled to [place], [place], [place], [place] or [place]; or in the [place] to [place], [place], [place], or [place]; and have fever, cough, and shortness of breath within the last 2 weeks of travel OR . Been in close contact with a person diagnosed with COVID-19 within the last 2 weeks and have fever, cough, and shortness of breath . IF YOU DO NOT MEET THESE CRITERIA, YOU ARE CONSIDERED LOW RISK FOR COVID-19.  What to do if you are HIGH RISK for COVID-19?  [name] If you are having a medical emergency, call 911. . Seek medical care right away. Before you go to a doctor's office, urgent care or emergency department, call ahead and tell them about your  recent travel, contact with someone diagnosed with COVID-19, and your symptoms. You should receive instructions from your physician's office regarding next steps of care.  . When you arrive at healthcare provider, tell the healthcare staff immediately you have returned from visiting [place], [place], [place], [place] or [place]; or traveled in the [place] to [place], [place], [place], or [place]; in the last two weeks or you have been in close contact with a person diagnosed with COVID-19 in the last 2 weeks.   . Tell the health care staff about your symptoms: fever, cough and shortness of breath. . After you have been seen by a medical provider, you will be either: o Tested for (COVID-19) and discharged home on quarantine except to seek medical care if symptoms worsen, and asked to  - Stay home and avoid contact with others until you get your results (4-5 days)  - Avoid travel on public transportation if possible (such as bus, train, or airplane) or o Sent to the Emergency Department by EMS for evaluation, COVID-19 testing, and possible admission depending on your condition and test results.  What to do if you are LOW RISK for COVID-19?  Reduce your risk of any infection by using the same precautions used for avoiding the common cold or flu:  [name] Wash your hands often with soap and warm water for at least 20 seconds.  If soap and water are not readily available, use an alcohol-based hand sanitizer with at least 60% alcohol.  . If coughing or  sneezing, cover your mouth and nose by coughing or sneezing into the elbow areas of your shirt or coat, into a tissue or into your sleeve (not your hands). . Avoid shaking hands with others and consider head nods or verbal greetings only. . Avoid touching your eyes, nose, or mouth with unwashed hands.  . Avoid close contact with people who are sick. . Avoid places or events with large numbers of people in one location, like concerts or sporting  events. . Carefully consider travel plans you have or are making. . If you are planning any travel outside or inside the [place], visit the CDC's Travelers' Health webpage for the latest health notices. . If you have some symptoms but not all symptoms, continue to monitor at home and seek medical attention if your symptoms worsen. . If you are having a medical emergency, call 911.   [place] / e-Visit: [email or web address]         [facility]: [facility]: [phone]                   [facility]: [phone]

## 2019-06-21 ENCOUNTER — Other Ambulatory Visit: Payer: Self-pay | Admitting: Cardiovascular Disease

## 2019-06-21 NOTE — Telephone Encounter (Signed)
New Message    *STAT* If patient is at the pharmacy, call can be transferred to refill team.   1. Which medications need to be refilled? (please list name of each medication and dose if known) furosemide (LASIX) 20 MG tablet  2. Which pharmacy/location (including street and city if local pharmacy) is medication to be sent to? [facility]  3. Do they need a 30 day or 90 day supply? 30 day

## 2019-06-21 NOTE — Progress Notes (Signed)
Remote ICD transmission.   

## 2019-06-22 ENCOUNTER — Other Ambulatory Visit: Payer: Self-pay | Admitting: *Deleted

## 2019-06-22 DIAGNOSIS — R7989 Other specified abnormal findings of blood chemistry: Secondary | ICD-10-CM

## 2019-06-22 MED ORDER — FUROSEMIDE 20 MG PO TABS: 20.0000 mg | ORAL_TABLET | Freq: Every day | ORAL | 0 refills | Status: DC

## 2019-06-22 MED ORDER — LEVOTHYROXINE SODIUM 125 MCG PO TABS: ORAL_TABLET | ORAL | 2 refills | Status: DC

## 2019-06-22 NOTE — Telephone Encounter (Signed)
Rx(s) sent to pharmacy electronically. 2nd attempt

## 2019-06-27 DIAGNOSIS — I700 Atherosclerosis of aorta: Secondary | ICD-10-CM | POA: Diagnosis not present

## 2019-06-27 DIAGNOSIS — G894 Chronic pain syndrome: Secondary | ICD-10-CM | POA: Diagnosis not present

## 2019-06-27 DIAGNOSIS — E063 Autoimmune thyroiditis: Secondary | ICD-10-CM | POA: Diagnosis not present

## 2019-06-27 DIAGNOSIS — I739 Peripheral vascular disease, unspecified: Secondary | ICD-10-CM | POA: Diagnosis not present

## 2019-07-03 ENCOUNTER — Other Ambulatory Visit: Payer: Self-pay | Admitting: *Deleted

## 2019-07-03 DIAGNOSIS — Z5112 Encounter for antineoplastic immunotherapy: Secondary | ICD-10-CM

## 2019-07-03 DIAGNOSIS — C3491 Malignant neoplasm of unspecified part of right bronchus or lung: Secondary | ICD-10-CM

## 2019-07-04 ENCOUNTER — Telehealth: Payer: Self-pay | Admitting: Internal Medicine

## 2019-07-04 ENCOUNTER — Other Ambulatory Visit: Payer: Self-pay

## 2019-07-04 ENCOUNTER — Inpatient Hospital Stay (HOSPITAL_BASED_OUTPATIENT_CLINIC_OR_DEPARTMENT_OTHER): Payer: Medicare Other | Admitting: Internal Medicine

## 2019-07-04 ENCOUNTER — Inpatient Hospital Stay: Payer: Medicare Other | Attending: Internal Medicine

## 2019-07-04 ENCOUNTER — Inpatient Hospital Stay: Payer: Medicare Other

## 2019-07-04 ENCOUNTER — Encounter: Payer: Self-pay | Admitting: Internal Medicine

## 2019-07-04 VITALS — BP 84/53 | HR 65 | Temp 98.0°F | Resp 18 | Ht 73.0 in | Wt 234.0 lb

## 2019-07-04 DIAGNOSIS — Z86718 Personal history of other venous thrombosis and embolism: Secondary | ICD-10-CM | POA: Insufficient documentation

## 2019-07-04 DIAGNOSIS — C3490 Malignant neoplasm of unspecified part of unspecified bronchus or lung: Secondary | ICD-10-CM

## 2019-07-04 DIAGNOSIS — F329 Major depressive disorder, single episode, unspecified: Secondary | ICD-10-CM | POA: Diagnosis not present

## 2019-07-04 DIAGNOSIS — C3491 Malignant neoplasm of unspecified part of right bronchus or lung: Secondary | ICD-10-CM

## 2019-07-04 DIAGNOSIS — C3411 Malignant neoplasm of upper lobe, right bronchus or lung: Secondary | ICD-10-CM | POA: Diagnosis not present

## 2019-07-04 DIAGNOSIS — E039 Hypothyroidism, unspecified: Secondary | ICD-10-CM | POA: Insufficient documentation

## 2019-07-04 DIAGNOSIS — I959 Hypotension, unspecified: Secondary | ICD-10-CM | POA: Insufficient documentation

## 2019-07-04 DIAGNOSIS — J45909 Unspecified asthma, uncomplicated: Secondary | ICD-10-CM | POA: Insufficient documentation

## 2019-07-04 DIAGNOSIS — E7800 Pure hypercholesterolemia, unspecified: Secondary | ICD-10-CM | POA: Diagnosis not present

## 2019-07-04 DIAGNOSIS — F419 Anxiety disorder, unspecified: Secondary | ICD-10-CM | POA: Diagnosis not present

## 2019-07-04 DIAGNOSIS — Z79899 Other long term (current) drug therapy: Secondary | ICD-10-CM | POA: Insufficient documentation

## 2019-07-04 DIAGNOSIS — Z5112 Encounter for antineoplastic immunotherapy: Secondary | ICD-10-CM

## 2019-07-04 DIAGNOSIS — Z95828 Presence of other vascular implants and grafts: Secondary | ICD-10-CM

## 2019-07-04 DIAGNOSIS — Z7982 Long term (current) use of aspirin: Secondary | ICD-10-CM | POA: Diagnosis not present

## 2019-07-04 DIAGNOSIS — I252 Old myocardial infarction: Secondary | ICD-10-CM | POA: Diagnosis not present

## 2019-07-04 LAB — CBC WITH DIFFERENTIAL (CANCER CENTER ONLY)
Abs Immature Granulocytes: 0.09 10*3/uL — ABNORMAL HIGH (ref 0.00–0.07)
Basophils Absolute: 0.1 10*3/uL (ref 0.0–0.1)
Basophils Relative: 1 %
Eosinophils Absolute: 0.5 10*3/uL (ref 0.0–0.5)
Eosinophils Relative: 6 %
HCT: 41.5 % (ref 39.0–52.0)
Hemoglobin: 13.5 g/dL (ref 13.0–17.0)
Immature Granulocytes: 1 %
Lymphocytes Relative: 8 %
Lymphs Abs: 0.7 10*3/uL (ref 0.7–4.0)
MCH: 31.4 pg (ref 26.0–34.0)
MCHC: 32.5 g/dL (ref 30.0–36.0)
MCV: 96.5 fL (ref 80.0–100.0)
Monocytes Absolute: 0.6 10*3/uL (ref 0.1–1.0)
Monocytes Relative: 7 %
Neutro Abs: 6.4 10*3/uL (ref 1.7–7.7)
Neutrophils Relative %: 77 %
Platelet Count: 218 10*3/uL (ref 150–400)
RBC: 4.3 MIL/uL (ref 4.22–5.81)
RDW: 14.4 % (ref 11.5–15.5)
WBC Count: 8.3 10*3/uL (ref 4.0–10.5)
nRBC: 0 % (ref 0.0–0.2)

## 2019-07-04 LAB — CMP (CANCER CENTER ONLY)
ALT: 19 U/L (ref 0–44)
AST: 17 U/L (ref 15–41)
Albumin: 3.8 g/dL (ref 3.5–5.0)
Alkaline Phosphatase: 102 U/L (ref 38–126)
Anion gap: 11 (ref 5–15)
BUN: 12 mg/dL (ref 6–20)
CO2: 27 mmol/L (ref 22–32)
Calcium: 8.7 mg/dL — ABNORMAL LOW (ref 8.9–10.3)
Chloride: 99 mmol/L (ref 98–111)
Creatinine: 1.04 mg/dL (ref 0.61–1.24)
GFR, Est AFR Am: >60 mL/min (ref >60)
GFR, Estimated: >60 mL/min (ref >60)
Glucose, Bld: 112 mg/dL — ABNORMAL HIGH (ref 70–99)
Potassium: 3.9 mmol/L (ref 3.5–5.1)
Sodium: 137 mmol/L (ref 135–145)
Total Bilirubin: 0.4 mg/dL (ref 0.3–1.2)
Total Protein: 7 g/dL (ref 6.5–8.1)

## 2019-07-04 MED ORDER — SODIUM CHLORIDE 0.9% FLUSH
10.0000 mL | Freq: Once | INTRAVENOUS | Status: AC
  Administered 2019-07-04: 10 mL
  Filled 2019-07-04: qty 10

## 2019-07-04 MED ORDER — HEPARIN SOD (PORK) LOCK FLUSH 100 UNIT/ML IV SOLN
500.0000 [IU] | Freq: Once | INTRAVENOUS | Status: AC | PRN
  Administered 2019-07-04: 500 [IU]
  Filled 2019-07-04: qty 5

## 2019-07-04 MED ORDER — SODIUM CHLORIDE 0.9 % IV SOLN
Freq: Once | INTRAVENOUS | Status: AC
  Administered 2019-07-04: 09:00:00 via INTRAVENOUS
  Filled 2019-07-04: qty 250

## 2019-07-04 MED ORDER — SODIUM CHLORIDE 0.9% FLUSH
10.0000 mL | INTRAVENOUS | Status: DC | PRN
  Administered 2019-07-04: 10 mL
  Filled 2019-07-04: qty 10

## 2019-07-04 MED ORDER — SODIUM CHLORIDE 0.9 % IV SOLN
9.6000 mg/kg | Freq: Once | INTRAVENOUS | Status: AC
  Administered 2019-07-04: 1000 mg via INTRAVENOUS
  Filled 2019-07-04: qty 20

## 2019-07-04 NOTE — Progress Notes (Signed)
[place] Telephone:([phone]) [phone]   Fax:(336) [phone]  OFFICE PROGRESS NOTE  [place], [place] [address]  DIAGNOSIS:stage IIIA (T3, N2, M0) non-small cell lung cancer, adenosquamous carcinoma diagnosed in June 2019 and presented with large right upper lobe lung mass with questionable chest wall invasion as well as right hilar and mediastinal lymphadenopathy.  Biomarker Findings Tumor Mutational Burden - TMB-High (21 Muts/Mb) Microsatellite status - MS-Stable Genomic Findings For a complete list of the genes assayed, please refer to the Appendix. ATM Z3007* CCND2 P281R KRAS G12V CHEK2 T330f*15 TP53 E298* 7 Disease relevant genes with no reportable alterations: ALK, EGFR, BRAF, MET, RET, ERBB2, ROS1  PRIOR THERAPY:Concurrent chemoradiation with chemotherapy consisting of weekly carboplatin for an AUC of 2 and paclitaxel 45 mg/m2. First dose given on 04/03/2018.  Status post 6 cycles.  Last dose was giving 05/15/2018 with partial response.  CURRENT THERAPY:Consolidation treatment with immunotherapy with Imfinzi (Durvalumab) 10 mg/KG every 2 weeks.  First dose 06/20/2018.  Status post 25 cycles.  INTERVAL HISTORY: Mark [name]58y.[id number]. male returns to the clinic today for follow-up visit.  The patient is feeling fine today with no concerning complaints except for fatigue.  He is feeling cold.  He was not taking his levothyroxine as prescribed.  He denied having any current chest pain, shortness of breath, cough or hemoptysis.  He has no fever or chills.  He has no nausea, vomiting, diarrhea or constipation.  He is here today for evaluation before starting cycle #26 of his immunotherapy.   MEDICAL HISTORY: Past Medical History:  Diagnosis Date  . Acute hepatitis C virus infection 06/19/2007   Qualifier: Diagnosis of  By: MLeilani MerlCMA, Tiffany    . Acute ST elevation myocardial infarction (STEMI) involving left anterior descending  (LAD) coronary artery ([facility] 05/26/2016  . Acute ST elevation myocardial infarction (STEMI) of lateral wall ([facility] 05/26/2016  . 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 Anxiety   . Asthma   . Atherosclerosis of native arteries of the extremities with intermittent claudication 12/16/2011  . Chronic hepatitis C without hepatic coma ([facility] 05/03/2014  . COPD with chronic bronchitis and emphysema ([facility] 03/13/2018   FEV1 57%  . Depression   . DVT (deep venous thrombosis) ([facility] 2009; 2019   RLE; LLE  . Encounter for antineoplastic chemotherapy 03/22/2018  . Hepatitis C    "tx'd in 2015"  . High cholesterol   . Ischemic cardiomyopathy 03/02/2018  . Non-small cell carcinoma of right lung, stage 3 ([facility] 03/22/2018  . PAD (peripheral artery disease) ([facility] 01/25/2013  . Peripheral vascular disease, unspecified 07/20/2012  . Port-A-Cath in place 04/24/2018  . ST elevation myocardial infarction involving left anterior descending (LAD) coronary artery ([facility]   . STEMI (ST elevation myocardial infarction) ([facility] 05/26/2016  . Tobacco abuse 01/28/2016  . Tubular adenoma of colon 07/11/2014  . Urinary dribbling     ALLERGIES:  is allergic to lyrica [pregabalin] and neurontin [gabapentin].  MEDICATIONS:  Current Outpatient Medications  Medication Sig Dispense Refill  . alprazolam (XANAX) 2 MG tablet Take 2 mg by mouth 4 (four) times daily as needed for anxiety.     .Marland Kitchenamitriptyline (ELAVIL) 100 MG tablet Take 100 mg by mouth at bedtime.     .Marland Kitchenaspirin 81 MG EC tablet Take 81 mg by mouth daily.      .Marland Kitchenatorvastatin (LIPITOR) 80 MG tablet Take 1 tablet (80 mg total) by mouth daily at 6  PM. 30 tablet 12  . carvedilol (COREG) 6.25 MG tablet TAKE 1 TABLET BY MOUTH TWICE A DAY WITH A MEAL 60 tablet 8  . citalopram (CELEXA) 40 MG tablet Take 40 mg by mouth daily.  11  . furosemide (LASIX) 20 MG tablet Take 1 tablet (20 mg total) by mouth daily. Please make overdue appt with Dr. Oval Linsey before  anymore refills. 2nd attempt 15 tablet 0  . HYDROcodone-acetaminophen (NORCO) 10-325 MG tablet Take 1 tablet by mouth every 4 (four) hours as needed for moderate pain.   0  . levETIRAcetam (KEPPRA) 750 MG tablet TAKE 1 TABLET BY MOUTH TWICE A DAY 60 tablet 5  . levothyroxine (SYNTHROID) 125 MCG tablet One tab po daily. 30 tablet 2  . lidocaine-prilocaine (EMLA) cream Apply 1 application topically as needed. 30 g 0  . lisinopril (PRINIVIL,ZESTRIL) 2.5 MG tablet Take 1 tablet (2.5 mg total) by mouth daily. 30 tablet 12  . nitroGLYCERIN (NITROSTAT) 0.4 MG SL tablet Place 1 tablet (0.4 mg total) under the tongue every 5 (five) minutes x 3 doses as needed for chest pain. (Patient not taking: Reported on 03/14/2019) 25 tablet 2  . prochlorperazine (COMPAZINE) 10 MG tablet Take 1 tablet (10 mg total) by mouth every 6 (six) hours as needed for nausea or vomiting. 30 tablet 0  . spironolactone (ALDACTONE) 25 MG tablet TAKE 1 TABLET BY MOUTH EVERY DAY 90 tablet 0  . sucralfate (CARAFATE) 1 g tablet Take 1 tablet (1 g total) by mouth 4 (four) times daily -  with meals and at bedtime. 5 min before meals for radiation induced esophagitis 120 tablet 2  . tamsulosin (FLOMAX) 0.4 MG CAPS capsule Take 1 capsule by mouth daily.    . Tiotropium Bromide-Olodaterol (STIOLTO RESPIMAT) 2.5-2.5 MCG/ACT AERS Inhale 2 puffs into the lungs daily. 1 Inhaler 5  . umeclidinium-vilanterol (ANORO ELLIPTA) 62.5-25 MCG/INH AEPB Inhale 1 puff into the lungs daily. 1 each 0   No current facility-administered medications for this visit.     SURGICAL HISTORY:  Past Surgical History:  Procedure Laterality Date  . AORTOGRAM  08/08/2009   for LLE claudication     By Dr. Oneida Alar  . 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 BIOPSY N/A 05/30/2014   Procedure: BIOPSY;  Surgeon: [name], MD;  Location: [facility];  Service: Endoscopy;  Laterality: N/A;  . BLADDER TUMOR EXCISION  8/812  . CARDIAC CATHETERIZATION N/A 05/26/2016    Procedure: Left Heart Cath and Coronary Angiography;  Surgeon: [name], MD;  Location: [facility];  Service: Cardiovascular;  Laterality: N/A;  . CARDIAC CATHETERIZATION N/A 05/26/2016   Procedure: Coronary Stent Intervention;  Surgeon: [name], MD;  Location: [facility];  Service: Cardiovascular;  Laterality: N/A;  . COLONOSCOPY WITH PROPOFOL N/A 05/30/2014   ZTI:WPYKDX colonic polyp-likely source of hematochezia-removed as described above  . ESOPHAGOGASTRODUODENOSCOPY (EGD) WITH PROPOFOL N/A 05/30/2014   IPJ:ASNKNL and bulbar erosions s/p gastric biopsy. No evidence of portal gastropathy on today's examination.  . FEMORAL-TIBIAL BYPASS GRAFT  2009   Right side using non-reversed GSV   By Dr. Oneida Alar  . FEMORAL-TIBIAL BYPASS GRAFT  11/24/2007   Right femoral to anterior tibial BPG   by Dr. Oneida Alar  . FINGER SURGERY Left    "straightened my pinky"  . HAND TENDON SURGERY Left 2013   Left 5th finger  . HERNIA REPAIR     "stomach"  . ICD IMPLANT N/A 03/02/2018  Procedure: ICD IMPLANT;  Surgeon: [name], MD;  Location: [facility];  Service: Cardiovascular;  Laterality: N/A;  . INCISIONAL HERNIA REPAIR N/A 12/25/2014   Procedure: HERNIA REPAIR INCISIONAL WITH MESH;  Surgeon: [name], MD;  Location: [facility];  Service: General;  Laterality: N/A;  . INSERTION OF MESH N/A 12/25/2014   Procedure: INSERTION OF MESH;  Surgeon: [name], MD;  Location: [facility];  Service: General;  Laterality: N/A;  . IR IMAGING GUIDED PORT INSERTION  04/11/2018  . LAPAROSCOPIC CHOLECYSTECTOMY    . LOWER EXTREMITY ANGIOGRAM Left 12/30/2015   Procedure: Lower Extremity Angiogram;  Surgeon: [name], MD;  Location: [facility];  Service: Cardiovascular;  Laterality: Left;  . PERIPHERAL VASCULAR CATHETERIZATION N/A 12/30/2015   Procedure: Abdominal Aortogram;  Surgeon: [name], MD;  Location: [facility];  Service: Cardiovascular;  Laterality:  N/A;  . POLYPECTOMY  05/30/2014   Procedure: POLYPECTOMY;  Surgeon: [name], MD;  Location: [facility];  Service: Endoscopy;;  . TONSILLECTOMY AND ADENOIDECTOMY  ~ 1970  . TYMPANOSTOMY TUBE PLACEMENT Bilateral ~ 1970    REVIEW OF SYSTEMS:  A comprehensive review of systems was negative except for: Constitutional: positive for fatigue Musculoskeletal: positive for muscle weakness   PHYSICAL EXAMINATION: General appearance: alert, cooperative, fatigued and no distress Head: Normocephalic, without obvious abnormality, atraumatic Neck: no adenopathy, no JVD, supple, symmetrical, trachea midline and thyroid not enlarged, symmetric, no tenderness/mass/nodules Lymph nodes: Cervical, supraclavicular, and axillary nodes normal. Resp: clear to auscultation bilaterally Back: symmetric, no curvature. ROM normal. No CVA tenderness. Cardio: regular rate and rhythm, S1, S2 normal, no murmur, click, rub or gallop GI: soft, non-tender; bowel sounds normal; no masses,  no organomegaly Extremities: Right below knee amputation otherwise no edema.  ECOG PERFORMANCE STATUS: 1 - Symptomatic but completely ambulatory  Blood pressure (!) 84/53, pulse 65, temperature 98 F (36.7 C), temperature source Temporal, resp. rate 18, height '6\' 1"'$  (1.854 m), weight 234 lb (106.1 kg), SpO2 98 %.  LABORATORY DATA: Lab Results  Component Value Date   WBC 8.3 07/04/2019   HGB 13.5 07/04/2019   HCT 41.5 07/04/2019   MCV 96.5 07/04/2019   PLT 218 07/04/2019      Chemistry      Component Value Date/Time   NA 135 06/20/2019 1052   NA 139 02/27/2018 1457   K 4.6 06/20/2019 1052   CL 95 (L) 06/20/2019 1052   CO2 32 06/20/2019 1052   BUN 12 06/20/2019 1052   BUN 7 02/27/2018 1457   CREATININE 1.12 06/20/2019 1052   CREATININE 1.13 06/28/2016 1453      Component Value Date/Time   CALCIUM 8.9 06/20/2019 1052   ALKPHOS 128 (H) 06/20/2019 1052   AST 27 06/20/2019 1052   ALT 33 06/20/2019 1052   BILITOT 0.3  06/20/2019 1052       RADIOGRAPHIC STUDIES: No results found.  ASSESSMENT AND PLAN: This is a very pleasant 58 years old white male recently diagnosed with a stage IIIa non-small cell lung cancer, adenosquamous carcinoma.  He underwent a course of concurrent chemoradiation with weekly carboplatin and paclitaxel status post 6 cycles with partial response. The patient is currently undergoing treatment with consolidation immunotherapy with Imfinzi status post 25 cycles. The patient has been tolerating his treatment well with no concerning adverse effects except for fatigue.  He was not compliant with his hypothyroid medication. He feels more tired and cold. I recommended for  the patient to proceed with cycle #26 which is the last cycle of this course today as planned. I will see the patient back for follow-up visit in 3 weeks for evaluation with repeat CT scan of the chest for restaging of his disease. For the hypotension, I will give the patient 500 cc of normal saline in the office today. He was advised to call immediately if he has any concerning symptoms in the interval. The patient voices understanding of current disease status and treatment options and is in agreement with the current care plan. All questions were answered. The patient knows to call the clinic with any problems, questions or concerns. We can certainly see the patient much sooner if necessary. I spent 10 minutes counseling the patient face to face. The total time spent in the appointment was 15 minutes.  Disclaimer: This note was dictated with voice recognition software. Similar sounding words can inadvertently be transcribed and may not be corrected upon review.

## 2019-07-04 NOTE — Telephone Encounter (Signed)
Scheduled per 10/07 per los, patient received calender and after visit summary.

## 2019-07-04 NOTE — Patient Instructions (Signed)
[facility] Discharge Instructions for Patients Receiving Chemotherapy  Today you received the following Immunotherapy: Imfinzi  To help prevent nausea and vomiting after your treatment, we encourage you to take your nausea medication as directed by your MD.   If you develop nausea and vomiting that is not controlled by your nausea medication, call the clinic.   BELOW ARE SYMPTOMS THAT SHOULD BE REPORTED IMMEDIATELY:  *FEVER GREATER THAN 100.5 F  *CHILLS WITH OR WITHOUT FEVER  NAUSEA AND VOMITING THAT IS NOT CONTROLLED WITH YOUR NAUSEA MEDICATION  *UNUSUAL SHORTNESS OF BREATH  *UNUSUAL BRUISING OR BLEEDING  TENDERNESS IN MOUTH AND THROAT WITH OR WITHOUT PRESENCE OF ULCERS  *URINARY PROBLEMS  *BOWEL PROBLEMS  UNUSUAL RASH Items with * indicate a potential emergency and should be followed up as soon as possible.  Feel free to call the clinic should you have any questions or concerns. The clinic phone number is [phone].  Please show the [facility] at check-in to the Emergency Department and triage nurse. Coronavirus (COVID-19) Are you at risk?  Are you at risk for the Coronavirus (COVID-19)?  To be considered HIGH RISK for Coronavirus (COVID-19), you have to meet the following criteria:  . Traveled to [place], [place], [place], [place] or [place]; or in the [place] to [place], [place], [place], or [place]; and have fever, cough, and shortness of breath within the last 2 weeks of travel OR . Been in close contact with a person diagnosed with COVID-19 within the last 2 weeks and have fever, cough, and shortness of breath . IF YOU DO NOT MEET THESE CRITERIA, YOU ARE CONSIDERED LOW RISK FOR COVID-19.  What to do if you are HIGH RISK for COVID-19?  [name] If you are having a medical emergency, call 911. . Seek medical care right away. Before you go to a doctor's office, urgent care or emergency department, call ahead and tell them about your  recent travel, contact with someone diagnosed with COVID-19, and your symptoms. You should receive instructions from your physician's office regarding next steps of care.  . When you arrive at healthcare provider, tell the healthcare staff immediately you have returned from visiting [place], [place], [place], [place] or [place]; or traveled in the [place] to [place], [place], [place], or [place]; in the last two weeks or you have been in close contact with a person diagnosed with COVID-19 in the last 2 weeks.   . Tell the health care staff about your symptoms: fever, cough and shortness of breath. . After you have been seen by a medical provider, you will be either: o Tested for (COVID-19) and discharged home on quarantine except to seek medical care if symptoms worsen, and asked to  - Stay home and avoid contact with others until you get your results (4-5 days)  - Avoid travel on public transportation if possible (such as bus, train, or airplane) or o Sent to the Emergency Department by EMS for evaluation, COVID-19 testing, and possible admission depending on your condition and test results.  What to do if you are LOW RISK for COVID-19?  Reduce your risk of any infection by using the same precautions used for avoiding the common cold or flu:  [name] Wash your hands often with soap and warm water for at least 20 seconds.  If soap and water are not readily available, use an alcohol-based hand sanitizer with at least 60% alcohol.  . If coughing or  sneezing, cover your mouth and nose by coughing or sneezing into the elbow areas of your shirt or coat, into a tissue or into your sleeve (not your hands). . Avoid shaking hands with others and consider head nods or verbal greetings only. . Avoid touching your eyes, nose, or mouth with unwashed hands.  . Avoid close contact with people who are sick. . Avoid places or events with large numbers of people in one location, like concerts or sporting  events. . Carefully consider travel plans you have or are making. . If you are planning any travel outside or inside the [place], visit the CDC's Travelers' Health webpage for the latest health notices. . If you have some symptoms but not all symptoms, continue to monitor at home and seek medical attention if your symptoms worsen. . If you are having a medical emergency, call 911.   [place] / e-Visit: [email or web address]         [facility]: [facility]: [phone]                   [facility]: [phone]

## 2019-07-08 ENCOUNTER — Other Ambulatory Visit: Payer: Self-pay | Admitting: Cardiovascular Disease

## 2019-07-13 ENCOUNTER — Telehealth: Payer: Self-pay | Admitting: Physician Assistant

## 2019-07-13 DIAGNOSIS — G894 Chronic pain syndrome: Secondary | ICD-10-CM | POA: Diagnosis not present

## 2019-07-13 DIAGNOSIS — S0083XA Contusion of other part of head, initial encounter: Secondary | ICD-10-CM | POA: Diagnosis not present

## 2019-07-13 NOTE — Telephone Encounter (Signed)
Spoke to the patient and his wife to inform them that he does not need the appointment on 07/18/2019 for treatment. He has completed his 26 cycles of immunotherapy. He was instructed to keep his CT scan appointment on 07/23/2019 and that we would see him back for a follow up visit on 07/30/2019 to go over the scan results. She expressed understanding.

## 2019-07-16 ENCOUNTER — Telehealth: Payer: Self-pay | Admitting: Physician Assistant

## 2019-07-16 NOTE — Telephone Encounter (Signed)
Called pt per 10/16 sch message = pt wife aware of changes

## 2019-07-18 ENCOUNTER — Other Ambulatory Visit: Payer: Self-pay

## 2019-07-18 ENCOUNTER — Other Ambulatory Visit: Payer: Medicare Other

## 2019-07-18 ENCOUNTER — Ambulatory Visit: Payer: Medicare Other

## 2019-07-18 ENCOUNTER — Ambulatory Visit: Payer: Medicare Other | Admitting: Physician Assistant

## 2019-07-18 NOTE — Patient Outreach (Signed)
[facility]) Care Management  07/18/2019  [name] Dec 15, 1960 [id number]   Medication Adherence call to Mr. [name] Telephone call to Patient regarding Medication Adherence unable to reach patient. Mr. [name] did not answer voice mail not set up.Mr. [name] is showing past due on Atorvastatin 80 mg under [name].   [facility] Management Direct Dial [phone]  Fax [phone] [name]

## 2019-07-23 ENCOUNTER — Other Ambulatory Visit: Payer: Self-pay

## 2019-07-23 ENCOUNTER — Ambulatory Visit (HOSPITAL_COMMUNITY)
Admission: RE | Admit: 2019-07-23 | Discharge: 2019-07-23 | Disposition: A | Discharge status: Home / Self Care | Payer: Medicare Other | Source: Ambulatory Visit | Attending: Internal Medicine | Admitting: Internal Medicine

## 2019-07-23 ENCOUNTER — Inpatient Hospital Stay: Payer: Medicare Other

## 2019-07-23 DIAGNOSIS — I252 Old myocardial infarction: Secondary | ICD-10-CM | POA: Diagnosis not present

## 2019-07-23 DIAGNOSIS — Z5112 Encounter for antineoplastic immunotherapy: Secondary | ICD-10-CM | POA: Diagnosis not present

## 2019-07-23 DIAGNOSIS — I959 Hypotension, unspecified: Secondary | ICD-10-CM | POA: Diagnosis not present

## 2019-07-23 DIAGNOSIS — C3490 Malignant neoplasm of unspecified part of unspecified bronchus or lung: Secondary | ICD-10-CM | POA: Diagnosis not present

## 2019-07-23 DIAGNOSIS — J45909 Unspecified asthma, uncomplicated: Secondary | ICD-10-CM | POA: Diagnosis not present

## 2019-07-23 DIAGNOSIS — Z5111 Encounter for antineoplastic chemotherapy: Secondary | ICD-10-CM | POA: Diagnosis not present

## 2019-07-23 DIAGNOSIS — Z79899 Other long term (current) drug therapy: Secondary | ICD-10-CM | POA: Diagnosis not present

## 2019-07-23 DIAGNOSIS — E7800 Pure hypercholesterolemia, unspecified: Secondary | ICD-10-CM | POA: Diagnosis not present

## 2019-07-23 DIAGNOSIS — Z86718 Personal history of other venous thrombosis and embolism: Secondary | ICD-10-CM | POA: Diagnosis not present

## 2019-07-23 DIAGNOSIS — E039 Hypothyroidism, unspecified: Secondary | ICD-10-CM | POA: Diagnosis not present

## 2019-07-23 DIAGNOSIS — C3411 Malignant neoplasm of upper lobe, right bronchus or lung: Secondary | ICD-10-CM | POA: Diagnosis not present

## 2019-07-23 DIAGNOSIS — Z7982 Long term (current) use of aspirin: Secondary | ICD-10-CM | POA: Diagnosis not present

## 2019-07-23 LAB — CBC WITH DIFFERENTIAL (CANCER CENTER ONLY)
Abs Immature Granulocytes: 0.09 10*3/uL — ABNORMAL HIGH (ref 0.00–0.07)
Basophils Absolute: 0.1 10*3/uL (ref 0.0–0.1)
Basophils Relative: 1 %
Eosinophils Absolute: 0.4 10*3/uL (ref 0.0–0.5)
Eosinophils Relative: 5 %
HCT: 43.3 % (ref 39.0–52.0)
Hemoglobin: 14.1 g/dL (ref 13.0–17.0)
Immature Granulocytes: 1 %
Lymphocytes Relative: 11 %
Lymphs Abs: 1 10*3/uL (ref 0.7–4.0)
MCH: 31.5 pg (ref 26.0–34.0)
MCHC: 32.6 g/dL (ref 30.0–36.0)
MCV: 96.7 fL (ref 80.0–100.0)
Monocytes Absolute: 0.8 10*3/uL (ref 0.1–1.0)
Monocytes Relative: 9 %
Neutro Abs: 6.3 10*3/uL (ref 1.7–7.7)
Neutrophils Relative %: 73 %
Platelet Count: 235 10*3/uL (ref 150–400)
RBC: 4.48 MIL/uL (ref 4.22–5.81)
RDW: 14 % (ref 11.5–15.5)
WBC Count: 8.7 10*3/uL (ref 4.0–10.5)
nRBC: 0 % (ref 0.0–0.2)

## 2019-07-23 LAB — CMP (CANCER CENTER ONLY)
ALT: 22 U/L (ref 0–44)
AST: 19 U/L (ref 15–41)
Albumin: 4.2 g/dL (ref 3.5–5.0)
Alkaline Phosphatase: 127 U/L — ABNORMAL HIGH (ref 38–126)
Anion gap: 11 (ref 5–15)
BUN: 9 mg/dL (ref 6–20)
CO2: 30 mmol/L (ref 22–32)
Calcium: 9.5 mg/dL (ref 8.9–10.3)
Chloride: 96 mmol/L — ABNORMAL LOW (ref 98–111)
Creatinine: 1.04 mg/dL (ref 0.61–1.24)
GFR, Est AFR Am: >60 mL/min (ref >60)
GFR, Estimated: >60 mL/min (ref >60)
Glucose, Bld: 94 mg/dL (ref 70–99)
Potassium: 4.5 mmol/L (ref 3.5–5.1)
Sodium: 137 mmol/L (ref 135–145)
Total Bilirubin: 0.3 mg/dL (ref 0.3–1.2)
Total Protein: 7.7 g/dL (ref 6.5–8.1)

## 2019-07-23 MED ORDER — IOHEXOL 300 MG/ML  SOLN
75.0000 mL | Freq: Once | INTRAMUSCULAR | Status: AC | PRN
  Administered 2019-07-23: 75 mL via INTRAVENOUS

## 2019-07-23 MED ORDER — HEPARIN SOD (PORK) LOCK FLUSH 100 UNIT/ML IV SOLN
INTRAVENOUS | Status: AC
  Filled 2019-07-23: qty 5

## 2019-07-23 MED ORDER — HEPARIN SOD (PORK) LOCK FLUSH 100 UNIT/ML IV SOLN
500.0000 [IU] | Freq: Once | INTRAVENOUS | Status: AC
  Administered 2019-07-23: 500 [IU] via INTRAVENOUS

## 2019-07-23 MED ORDER — SODIUM CHLORIDE (PF) 0.9 % IJ SOLN
INTRAMUSCULAR | Status: AC
  Filled 2019-07-23: qty 50

## 2019-07-25 ENCOUNTER — Other Ambulatory Visit: Payer: Medicare Other

## 2019-07-25 ENCOUNTER — Other Ambulatory Visit (HOSPITAL_COMMUNITY): Payer: Self-pay | Admitting: Internal Medicine

## 2019-07-25 DIAGNOSIS — S0083XA Contusion of other part of head, initial encounter: Secondary | ICD-10-CM

## 2019-07-28 DIAGNOSIS — J449 Chronic obstructive pulmonary disease, unspecified: Secondary | ICD-10-CM | POA: Diagnosis not present

## 2019-07-28 DIAGNOSIS — C3491 Malignant neoplasm of unspecified part of right bronchus or lung: Secondary | ICD-10-CM | POA: Diagnosis not present

## 2019-07-28 DIAGNOSIS — I10 Essential (primary) hypertension: Secondary | ICD-10-CM | POA: Diagnosis not present

## 2019-07-30 ENCOUNTER — Encounter: Payer: Self-pay | Admitting: Internal Medicine

## 2019-07-30 ENCOUNTER — Telehealth: Payer: Self-pay | Admitting: Internal Medicine

## 2019-07-30 ENCOUNTER — Other Ambulatory Visit: Payer: Self-pay

## 2019-07-30 ENCOUNTER — Inpatient Hospital Stay: Payer: Medicare Other | Attending: Internal Medicine | Admitting: Internal Medicine

## 2019-07-30 VITALS — BP 103/80 | HR 84 | Temp 98.3°F | Resp 18 | Ht 73.0 in | Wt 217.0 lb

## 2019-07-30 DIAGNOSIS — E7800 Pure hypercholesterolemia, unspecified: Secondary | ICD-10-CM | POA: Insufficient documentation

## 2019-07-30 DIAGNOSIS — Z720 Tobacco use: Secondary | ICD-10-CM | POA: Diagnosis not present

## 2019-07-30 DIAGNOSIS — J449 Chronic obstructive pulmonary disease, unspecified: Secondary | ICD-10-CM | POA: Insufficient documentation

## 2019-07-30 DIAGNOSIS — F329 Major depressive disorder, single episode, unspecified: Secondary | ICD-10-CM | POA: Insufficient documentation

## 2019-07-30 DIAGNOSIS — C3490 Malignant neoplasm of unspecified part of unspecified bronchus or lung: Secondary | ICD-10-CM

## 2019-07-30 DIAGNOSIS — I252 Old myocardial infarction: Secondary | ICD-10-CM | POA: Diagnosis not present

## 2019-07-30 DIAGNOSIS — Z86718 Personal history of other venous thrombosis and embolism: Secondary | ICD-10-CM | POA: Insufficient documentation

## 2019-07-30 DIAGNOSIS — F419 Anxiety disorder, unspecified: Secondary | ICD-10-CM | POA: Diagnosis not present

## 2019-07-30 DIAGNOSIS — Z7982 Long term (current) use of aspirin: Secondary | ICD-10-CM | POA: Diagnosis not present

## 2019-07-30 DIAGNOSIS — Z5112 Encounter for antineoplastic immunotherapy: Secondary | ICD-10-CM

## 2019-07-30 DIAGNOSIS — C3411 Malignant neoplasm of upper lobe, right bronchus or lung: Secondary | ICD-10-CM | POA: Diagnosis not present

## 2019-07-30 DIAGNOSIS — C3491 Malignant neoplasm of unspecified part of right bronchus or lung: Secondary | ICD-10-CM | POA: Diagnosis not present

## 2019-07-30 DIAGNOSIS — Z79899 Other long term (current) drug therapy: Secondary | ICD-10-CM | POA: Diagnosis not present

## 2019-07-30 NOTE — Telephone Encounter (Signed)
Scheduled appt per 11/2 los - gave pt AVS and calender per los.

## 2019-07-30 NOTE — Progress Notes (Signed)
[facility] [phone]   [phone]  OFFICE PROGRESS NOTE  [place], [place] [address]  DIAGNOSIS:stage IIIA (T3, N2, M0) non-small cell lung cancer, adenosquamous carcinoma diagnosed in June 2019 and presented with large right upper lobe lung mass with questionable chest wall invasion as well as right hilar and mediastinal lymphadenopathy.  Biomarker Findings Tumor Mutational Burden - TMB-High (21 Muts/Mb) Microsatellite status - MS-Stable Genomic Findings For a complete list of the genes assayed, please refer to the Appendix. ATM H6759* CCND2 P281R KRAS G12V CHEK2 T32f*15 TP53 E298* 7 Disease relevant genes with no reportable alterations: ALK, EGFR, BRAF, MET, RET, ERBB2, ROS1  PRIOR THERAPY: 1) Concurrent chemoradiation with chemotherapy consisting of weekly carboplatin for an AUC of 2 and paclitaxel 45 mg/m2. First dose given on 04/03/2018.  Status post 6 cycles.  Last dose was giving 05/15/2018 with partial response. 2) Consolidation treatment with immunotherapy with Imfinzi (Durvalumab) 10 mg/KG every 2 weeks.  First dose 06/20/2018.  Status post 26 cycles.  CURRENT THERAPY:Observation.  INTERVAL HISTORY: Mark [id number].o. male returns to the clinic today for follow-up visit.  The patient is feeling fine today with no concerning complaints.  He completed a course of consolidation treatment with immunotherapy with Imfinzi and tolerated the treatment fairly well.  He denied having any current chest pain, shortness of breath, cough or hemoptysis.  He denied having any fever or chills.  He has no nausea, vomiting, diarrhea or constipation.  He has no headache or visual changes.  He had repeat CT scan of the chest performed recently and is here for evaluation and discussion of his scan results.  MEDICAL HISTORY: Past Medical History:  Diagnosis Date  . Acute hepatitis C virus infection 06/19/2007   Qualifier: Diagnosis of  By: MLeilani MerlCMA, Tiffany    . Acute ST elevation myocardial infarction (STEMI) involving left anterior descending (LAD) coronary artery ([facility] 05/26/2016  . Acute ST elevation myocardial infarction (STEMI) of lateral wall ([facility] 05/26/2016  . 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 Anxiety   . Asthma   . Atherosclerosis of native arteries of the extremities with intermittent claudication 12/16/2011  . Chronic hepatitis C without hepatic coma ([facility] 05/03/2014  . COPD with chronic bronchitis and emphysema ([facility] 03/13/2018   FEV1 57%  . Depression   . DVT (deep venous thrombosis) ([facility] 2009; 2019   RLE; LLE  . Encounter for antineoplastic chemotherapy 03/22/2018  . Hepatitis C    "tx'd in 2015"  . High cholesterol   . Ischemic cardiomyopathy 03/02/2018  . Non-small cell carcinoma of right lung, stage 3 ([facility] 03/22/2018  . PAD (peripheral artery disease) ([facility] 01/25/2013  . Peripheral vascular disease, unspecified 07/20/2012  . Port-A-Cath in place 04/24/2018  . ST elevation myocardial infarction involving left anterior descending (LAD) coronary artery ([facility]   . STEMI (ST elevation myocardial infarction) ([facility] 05/26/2016  . Tobacco abuse 01/28/2016  . Tubular adenoma of colon 07/11/2014  . Urinary dribbling     ALLERGIES:  is allergic to lyrica [pregabalin] and neurontin [gabapentin].  MEDICATIONS:  Current Outpatient Medications  Medication Sig Dispense Refill  . alprazolam (XANAX) 2 MG tablet Take 2 mg by mouth 4 (four) times daily as needed for anxiety.     .Marland Kitchenamitriptyline (ELAVIL) 100 MG tablet Take 100 mg by mouth at bedtime.     .Marland Kitchenaspirin 81 MG EC tablet Take 81 mg by mouth daily.      .[name]  atorvastatin (LIPITOR) 80 MG tablet Take 1 tablet (80 mg total) by mouth daily at 6 PM. 30 tablet 12  . carvedilol (COREG) 6.25 MG tablet Take 1 tablet (6.25 mg total) by mouth 2 (two) times daily with a meal. OFFICE VISIT NEEDED 60 tablet 8  . citalopram  (CELEXA) 40 MG tablet Take 40 mg by mouth daily.  11  . furosemide (LASIX) 20 MG tablet Take 1 tablet (20 mg total) by mouth daily. Please make overdue appt with Dr. Oval Linsey before anymore refills. 2nd attempt 15 tablet 0  . HYDROcodone-acetaminophen (NORCO) 10-325 MG tablet Take 1 tablet by mouth every 4 (four) hours as needed for moderate pain.   0  . levETIRAcetam (KEPPRA) 750 MG tablet TAKE 1 TABLET BY MOUTH TWICE A DAY 60 tablet 5  . levothyroxine (SYNTHROID) 125 MCG tablet One tab po daily. 30 tablet 2  . lidocaine-prilocaine (EMLA) cream Apply 1 application topically as needed. 30 g 0  . lisinopril (PRINIVIL,ZESTRIL) 2.5 MG tablet Take 1 tablet (2.5 mg total) by mouth daily. 30 tablet 12  . nitroGLYCERIN (NITROSTAT) 0.4 MG SL tablet Place 1 tablet (0.4 mg total) under the tongue every 5 (five) minutes x 3 doses as needed for chest pain. 25 tablet 2  . prochlorperazine (COMPAZINE) 10 MG tablet Take 1 tablet (10 mg total) by mouth every 6 (six) hours as needed for nausea or vomiting. 30 tablet 0  . sildenafil (VIAGRA) 25 MG tablet TAKE 1 TABLET BY MOUTH 60UMINUTES PRIOR TOEENCOUNTER.S    . spironolactone (ALDACTONE) 25 MG tablet TAKE 1 TABLET BY MOUTH EVERY DAY 90 tablet 0  . sucralfate (CARAFATE) 1 g tablet Take 1 tablet (1 g total) by mouth 4 (four) times daily -  with meals and at bedtime. 5 min before meals for radiation induced esophagitis 120 tablet 2  . tamsulosin (FLOMAX) 0.4 MG CAPS capsule Take 1 capsule by mouth daily.    . Tiotropium Bromide-Olodaterol (STIOLTO RESPIMAT) 2.5-2.5 MCG/ACT AERS Inhale 2 puffs into the lungs daily. 1 Inhaler 5  . umeclidinium-vilanterol (ANORO ELLIPTA) 62.5-25 MCG/INH AEPB Inhale 1 puff into the lungs daily. 1 each 0   No current facility-administered medications for this visit.     SURGICAL HISTORY:  Past Surgical History:  Procedure Laterality Date  . AORTOGRAM  08/08/2009   for LLE claudication     By Dr. Oneida Alar  . BELOW KNEE LEG AMPUTATION  Right 04/25/2008  . BIOPSY N/A 05/30/2014   Procedure: BIOPSY;  Surgeon: [name], MD;  Location: [facility];  Service: Endoscopy;  Laterality: N/A;  . BLADDER TUMOR EXCISION  8/812  . CARDIAC CATHETERIZATION N/A 05/26/2016   Procedure: Left Heart Cath and Coronary Angiography;  Surgeon: [name], MD;  Location: [facility];  Service: Cardiovascular;  Laterality: N/A;  . CARDIAC CATHETERIZATION N/A 05/26/2016   Procedure: Coronary Stent Intervention;  Surgeon: [name], MD;  Location: [facility];  Service: Cardiovascular;  Laterality: N/A;  . COLONOSCOPY WITH PROPOFOL N/A 05/30/2014   IWP:YKDXIP colonic polyp-likely source of hematochezia-removed as described above  . ESOPHAGOGASTRODUODENOSCOPY (EGD) WITH PROPOFOL N/A 05/30/2014   JAS:NKNLZJ and bulbar erosions s/p gastric biopsy. No evidence of portal gastropathy on today's examination.  . FEMORAL-TIBIAL BYPASS GRAFT  2009   Right side using non-reversed GSV   By Dr. Oneida Alar  . FEMORAL-TIBIAL BYPASS GRAFT  11/24/2007   Right femoral to anterior tibial BPG   by Dr. Oneida Alar  . FINGER SURGERY Left    "  straightened my pinky"  . HAND TENDON SURGERY Left 2013   Left 5th finger  . HERNIA REPAIR     "stomach"  . ICD IMPLANT N/A 03/02/2018   Procedure: ICD IMPLANT;  Surgeon: [name], MD;  Location: [facility];  Service: Cardiovascular;  Laterality: N/A;  . INCISIONAL HERNIA REPAIR N/A 12/25/2014   Procedure: HERNIA REPAIR INCISIONAL WITH MESH;  Surgeon: [name], MD;  Location: [facility];  Service: General;  Laterality: N/A;  . INSERTION OF MESH N/A 12/25/2014   Procedure: INSERTION OF MESH;  Surgeon: [name], MD;  Location: [facility];  Service: General;  Laterality: N/A;  . IR IMAGING GUIDED PORT INSERTION  04/11/2018  . LAPAROSCOPIC CHOLECYSTECTOMY    . LOWER EXTREMITY ANGIOGRAM Left 12/30/2015   Procedure: Lower Extremity Angiogram;  Surgeon: [name], MD;  Location: [facility];   Service: Cardiovascular;  Laterality: Left;  . PERIPHERAL VASCULAR CATHETERIZATION N/A 12/30/2015   Procedure: Abdominal Aortogram;  Surgeon: [name], MD;  Location: [facility];  Service: Cardiovascular;  Laterality: N/A;  . POLYPECTOMY  05/30/2014   Procedure: POLYPECTOMY;  Surgeon: [name], MD;  Location: [facility];  Service: Endoscopy;;  . TONSILLECTOMY AND ADENOIDECTOMY  ~ 1970  . TYMPANOSTOMY TUBE PLACEMENT Bilateral ~ 1970    REVIEW OF SYSTEMS:  Constitutional: positive for fatigue Eyes: negative Ears, nose, mouth, throat, and face: negative Respiratory: negative Cardiovascular: negative Gastrointestinal: negative Genitourinary:negative Integument/breast: negative Hematologic/lymphatic: negative Musculoskeletal:negative Neurological: negative Behavioral/Psych: negative Endocrine: negative Allergic/Immunologic: negative   PHYSICAL EXAMINATION: General appearance: alert, cooperative, fatigued and no distress Head: Normocephalic, without obvious abnormality, atraumatic Neck: no adenopathy, no JVD, supple, symmetrical, trachea midline and thyroid not enlarged, symmetric, no tenderness/mass/nodules Lymph nodes: Cervical, supraclavicular, and axillary nodes normal. Resp: clear to auscultation bilaterally Back: symmetric, no curvature. ROM normal. No CVA tenderness. Cardio: regular rate and rhythm, S1, S2 normal, no murmur, click, rub or gallop GI: soft, non-tender; bowel sounds normal; no masses,  no organomegaly Extremities: Right below knee amputation otherwise no edema. Neurologic: Alert and oriented X 3, normal strength and tone. Normal symmetric reflexes. Normal coordination and gait  ECOG PERFORMANCE STATUS: 1 - Symptomatic but completely ambulatory  Blood pressure 103/80, pulse 84, temperature 98.3 F (36.8 C), temperature source Temporal, resp. rate 18, height '6\' 1"'$  (1.854 m), weight 217 lb (98.4 kg), SpO2 97 %.  LABORATORY DATA: Lab Results   Component Value Date   WBC 8.7 07/23/2019   HGB 14.1 07/23/2019   HCT 43.3 07/23/2019   MCV 96.7 07/23/2019   PLT 235 07/23/2019      Chemistry      Component Value Date/Time   NA 137 07/23/2019 1312   NA 139 02/27/2018 1457   K 4.5 07/23/2019 1312   CL 96 (L) 07/23/2019 1312   CO2 30 07/23/2019 1312   BUN 9 07/23/2019 1312   BUN 7 02/27/2018 1457   CREATININE 1.04 07/23/2019 1312   CREATININE 1.13 06/28/2016 1453      Component Value Date/Time   CALCIUM 9.5 07/23/2019 1312   ALKPHOS 127 (H) 07/23/2019 1312   AST 19 07/23/2019 1312   ALT 22 07/23/2019 1312   BILITOT 0.3 07/23/2019 1312       RADIOGRAPHIC STUDIES: Ct Chest W Contrast  Result Date: 07/23/2019 CLINICAL DATA:  Lung cancer, status post chemotherapy and radiation, immunotherapy ongoing EXAM: CT CHEST WITH CONTRAST TECHNIQUE: Multidetector CT imaging of the chest was performed during  intravenous contrast administration. CONTRAST:  33m OMNIPAQUE IOHEXOL 300 MG/ML  SOLN COMPARISON:  03/10/2019 FINDINGS: Cardiovascular: Heart is normal in size.  No pericardial effusion. No evidence of thoracic aortic aneurysm. Coronary atherosclerosis with coronary stent in the LAD. Right chest port terminates in the cavoatrial junction. Left subclavian ICD. Mediastinum/Nodes: 8 mm short axis subcarinal node, within normal limits. No suspicious thoracic lymphadenopathy. Visualized thyroid is unremarkable. Lungs/Pleura: 5.6 x 4.6 cm right apical mass with surrounding radiation changes, previously 5.9 x 5.1 cm, stable versus mildly decreased. Additional ground-glass nodularity in the right upper lobe (series 7/images 45 and 49) and left lower lobe (series 7/images 50, 52, and 53), measuring up to 7 mm in the left lower lobe, stable versus mildly improved. Overall, this appearance favors developing radiation changes. Mild volume loss in the right upper hemithorax. Benign perifissural nodule in the left hemithorax and 3 mm subpleural nodule in  the left lower lobe (series 7/image 70), unchanged. Benign calcified granuloma in the posterior left lower lobe (series 7/image 98). Stable ground-glass nodularity in the left upper lobe/lingula (series 7/image 43), favored to be benign. Mild centrilobular and paraseptal emphysematous changes, upper lung predominant. No focal consolidation. No pleural effusion or pneumothorax. Upper Abdomen: Visualized upper abdomen is notable for a nodular hepatic contour. Musculoskeletal: Degenerative changes of the visualized thoracolumbar spine. IMPRESSION: 5.6 cm right apical mass with surrounding radiation changes, stable versus mildly improved. Scattered pulmonary nodularity is grossly unchanged. No evidence of new/progressive metastatic disease. Aortic Atherosclerosis (ICD10-I70.0) and Emphysema (ICD10-J43.9). Electronically Signed   By: SJulian HyM.D.   On: 07/23/2019 17:39    ASSESSMENT AND PLAN: This is a very pleasant 58years old white male recently diagnosed with a stage IIIa non-small cell lung cancer, adenosquamous carcinoma.  He underwent a course of concurrent chemoradiation with weekly carboplatin and paclitaxel status post 6 cycles with partial response. The patient completed treatment with consolidation immunotherapy with Imfinzi status post 26 cycles.  He tolerated this treatment well with no concerning adverse effects. The patient had repeat CT scan of the chest performed recently.  I personally and independently reviewed the scans and discussed the results with the patient today. His scan showed no concerning findings for disease progression.  He has a stable to mild improvement of the right apical lung mass. I recommended for the patient to continue on observation with repeat CT scan of the chest in 3 months. He was advised to call immediately if he has any concerning symptoms in the interval. The patient voices understanding of current disease status and treatment options and is in  agreement with the current care plan. All questions were answered. The patient knows to call the clinic with any problems, questions or concerns. We can certainly see the patient much sooner if necessary. I spent 15 minutes counseling the patient face to face. The total time spent in the appointment was 25 minutes.  Disclaimer: This note was dictated with voice recognition software. Similar sounding words can inadvertently be transcribed and may not be corrected upon review.

## 2019-08-15 DIAGNOSIS — G894 Chronic pain syndrome: Secondary | ICD-10-CM | POA: Diagnosis not present

## 2019-08-15 DIAGNOSIS — S0083XA Contusion of other part of head, initial encounter: Secondary | ICD-10-CM | POA: Diagnosis not present

## 2019-08-27 ENCOUNTER — Telehealth: Payer: Self-pay | Admitting: *Deleted

## 2019-08-27 DIAGNOSIS — I10 Essential (primary) hypertension: Secondary | ICD-10-CM | POA: Diagnosis not present

## 2019-08-27 DIAGNOSIS — C3491 Malignant neoplasm of unspecified part of right bronchus or lung: Secondary | ICD-10-CM | POA: Diagnosis not present

## 2019-08-27 DIAGNOSIS — J449 Chronic obstructive pulmonary disease, unspecified: Secondary | ICD-10-CM | POA: Diagnosis not present

## 2019-08-27 NOTE — Telephone Encounter (Signed)
Wife states that Pershing has been having more seizures since he has been taking keppra. He has been taking it for many months, but would not let his wife report the seizure activity.  Will be scheduled to see Dr Mickeal Skinner in February when he comes to see Dr Julien Nordmann. Does not want an earlier appt.

## 2019-08-29 ENCOUNTER — Telehealth: Payer: Self-pay | Admitting: Internal Medicine

## 2019-08-29 NOTE — Telephone Encounter (Signed)
Confirmed 2/1 and 2/4 appointments with wife. Visit with MM moved from 2/3 to 2/4 to coordinate with visit to see ZV. Per 11/30 schedule message add ZV same day as visit with MM - 12/3 ADMIN day for ZV.

## 2019-09-05 ENCOUNTER — Ambulatory Visit (INDEPENDENT_AMBULATORY_CARE_PROVIDER_SITE_OTHER): Payer: Medicare Other | Admitting: *Deleted

## 2019-09-05 DIAGNOSIS — I255 Ischemic cardiomyopathy: Secondary | ICD-10-CM | POA: Diagnosis not present

## 2019-09-05 LAB — CUP PACEART REMOTE DEVICE CHECK
Battery Remaining Longevity: 128 mo
Battery Voltage: 3.01 V
Brady Statistic RV Percent Paced: 0.01 %
Date Time Interrogation Session: 20201209033422
HighPow Impedance: 82 Ohm
Implantable Lead Implant Date: 20190606
Implantable Lead Location: 753860
Implantable Pulse Generator Implant Date: 20190606
Lead Channel Impedance Value: 342 Ohm
Lead Channel Impedance Value: 399 Ohm
Lead Channel Pacing Threshold Amplitude: 0.875 V
Lead Channel Pacing Threshold Pulse Width: 0.4 ms
Lead Channel Sensing Intrinsic Amplitude: 22.125 mV
Lead Channel Sensing Intrinsic Amplitude: 22.125 mV
Lead Channel Setting Pacing Amplitude: 2.5 V
Lead Channel Setting Pacing Pulse Width: 0.4 ms
Lead Channel Setting Sensing Sensitivity: 0.3 mV

## 2019-09-13 DIAGNOSIS — G894 Chronic pain syndrome: Secondary | ICD-10-CM | POA: Diagnosis not present

## 2019-09-29 ENCOUNTER — Other Ambulatory Visit: Payer: Self-pay | Admitting: Physician Assistant

## 2019-09-29 DIAGNOSIS — R7989 Other specified abnormal findings of blood chemistry: Secondary | ICD-10-CM

## 2019-10-11 DIAGNOSIS — E7849 Other hyperlipidemia: Secondary | ICD-10-CM | POA: Diagnosis not present

## 2019-10-11 DIAGNOSIS — E063 Autoimmune thyroiditis: Secondary | ICD-10-CM | POA: Diagnosis not present

## 2019-10-11 DIAGNOSIS — G894 Chronic pain syndrome: Secondary | ICD-10-CM | POA: Diagnosis not present

## 2019-10-13 NOTE — Progress Notes (Signed)
ICD remote 

## 2019-10-19 IMAGING — CT CT HEAD WO/W CM
3 of 4 series · 15 of 47 positions shown, 18 images · IV contrast (APPLIED)
Comparison: 03/11/2005

CLINICAL DATA: New diagnosis lung cancer.  Staging.

EXAM:
CT HEAD WITHOUT AND WITH CONTRAST
TECHNIQUE: Contiguous axial images were obtained from the base of the skull
through the vertex without and with intravenous contrast
CONTRAST:  75mL YRSLML-3YY IOPAMIDOL (YRSLML-3YY) INJECTION 61%

[Series 2: head wo · axial · 0.47mm/px · z∈[-117,+13]mm · 9 of 32 slices shown, 12 images]
[im 3/32  brain]
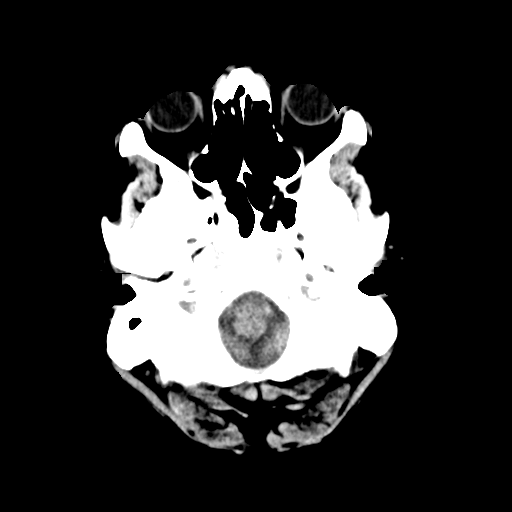
[im 3/32  bone]
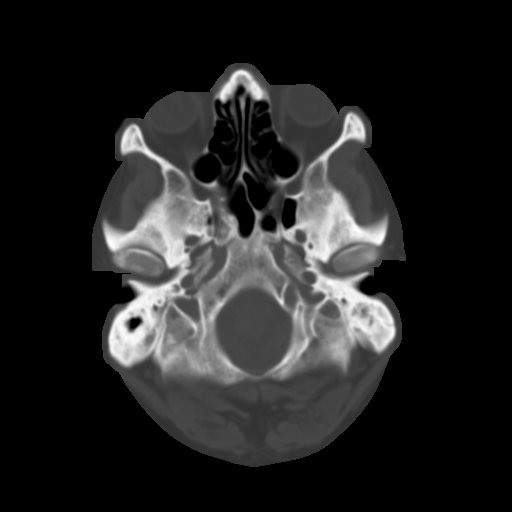
[im 7/32  brain]
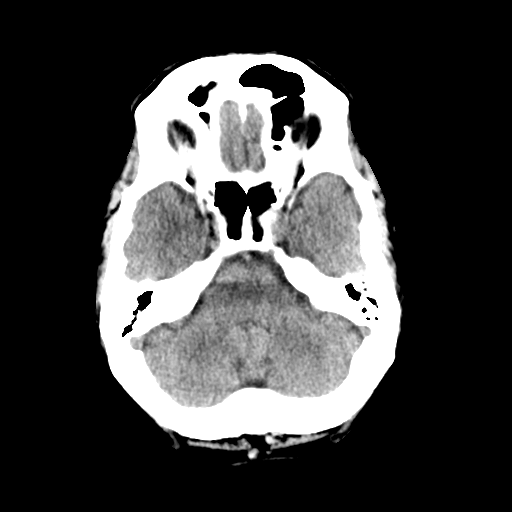
[im 9/32  brain]
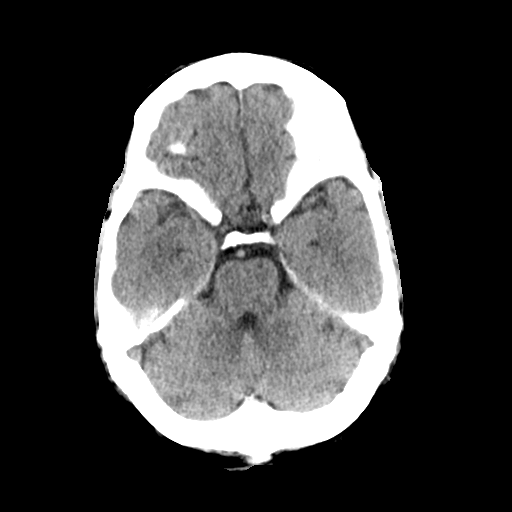
[im 14/32  brain]
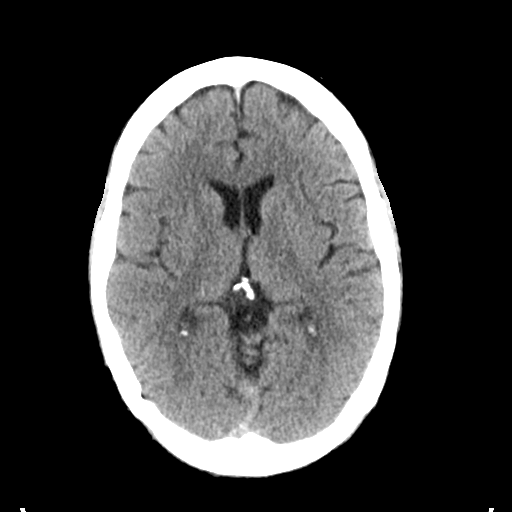
[im 16/32  brain]
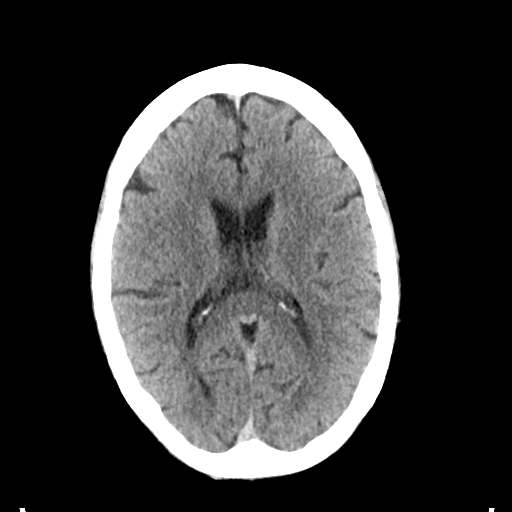
[im 16/32  bone]
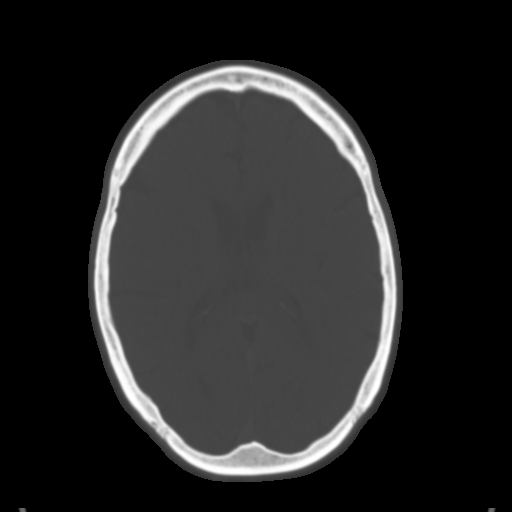
[im 18/32  brain]
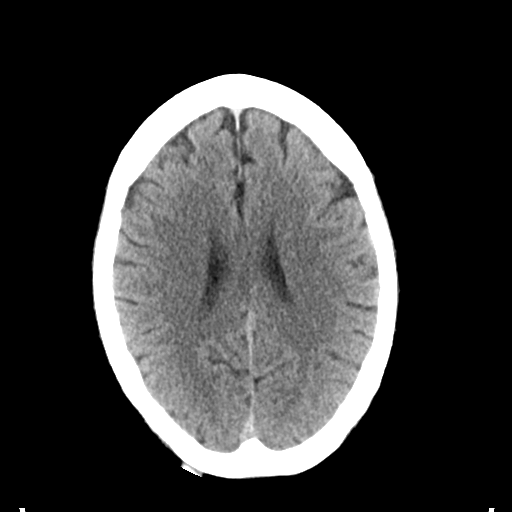
[im 23/32  brain]
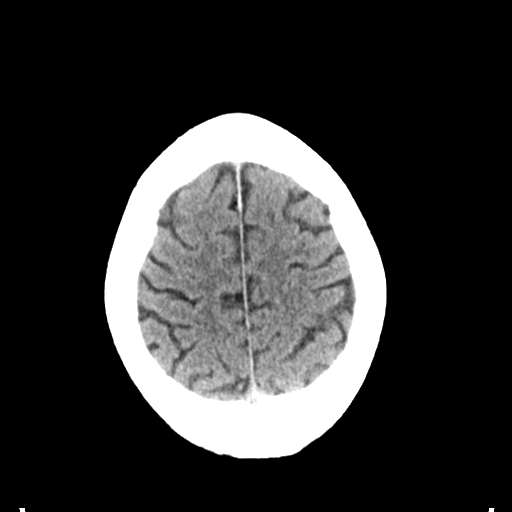
[im 25/32  brain]
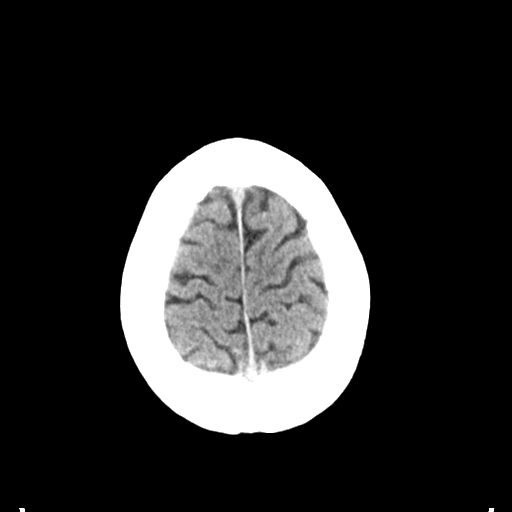
[im 29/32  brain]
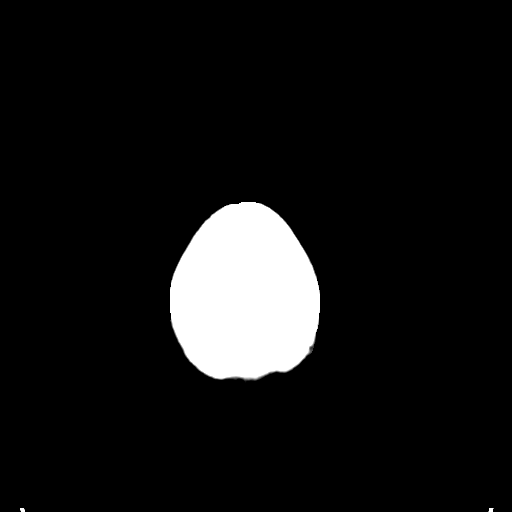
[im 29/32  bone]
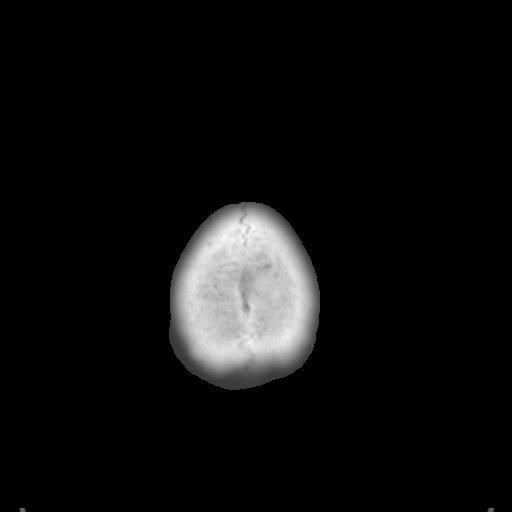

[Series 5: coronal soft tissue · coronal · 0.30mm/px · 3 of 69 slices shown]
[im 23/69  brain]
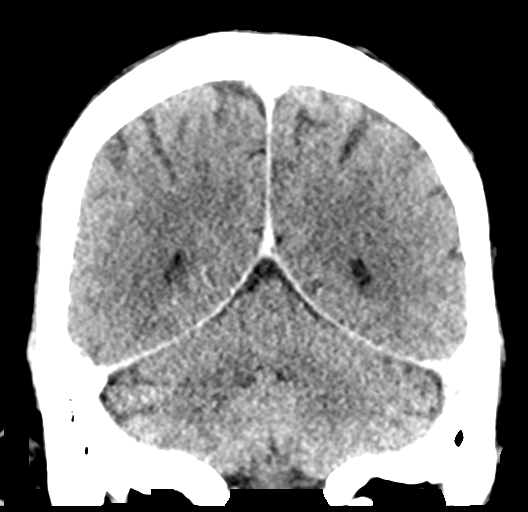
[im 31/69  brain]
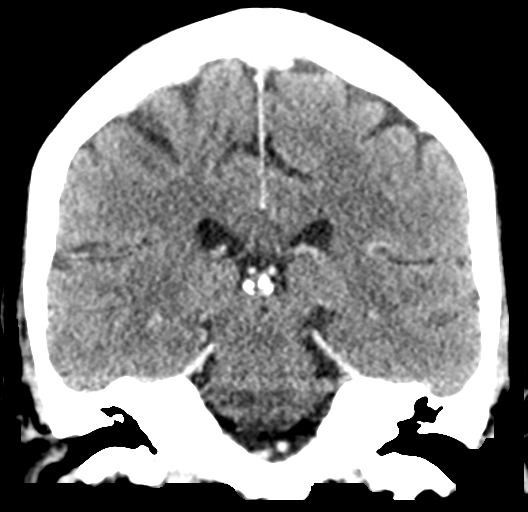
[im 38/69  brain]
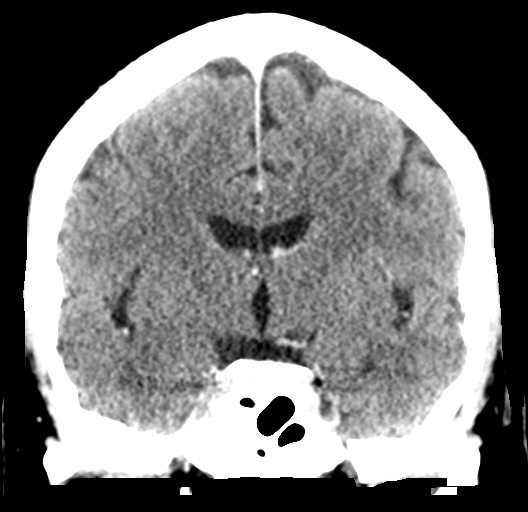

[Series 6: sagittal soft tissue · sagittal · 0.30mm/px · 3 of 53 slices shown]
[im 18/53  brain]
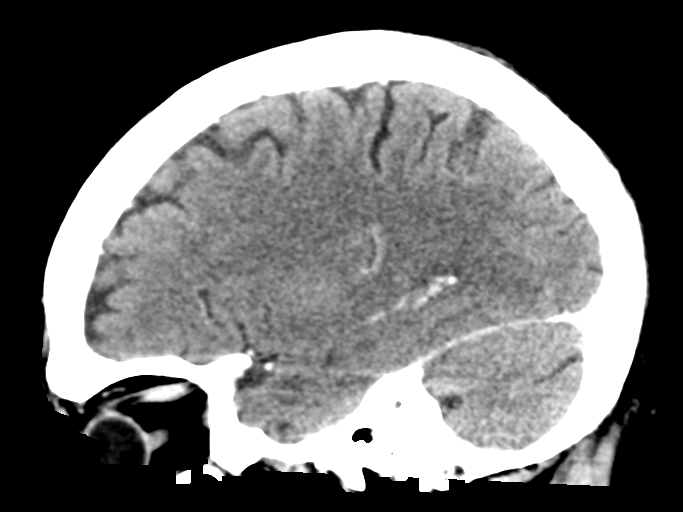
[im 27/53  brain]
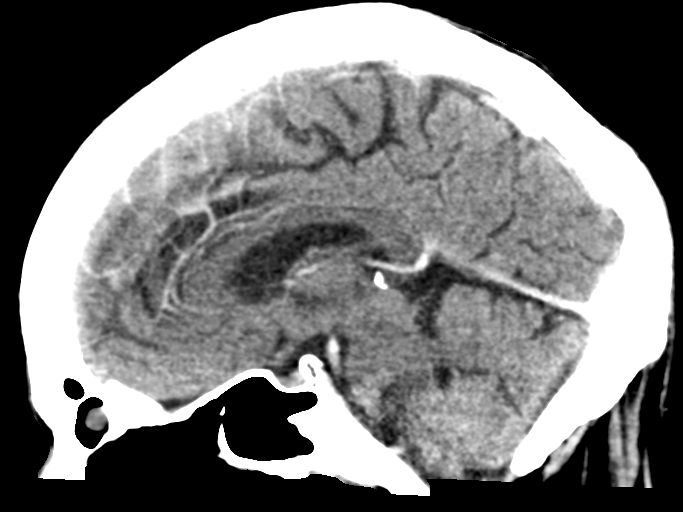
[im 35/53  brain]
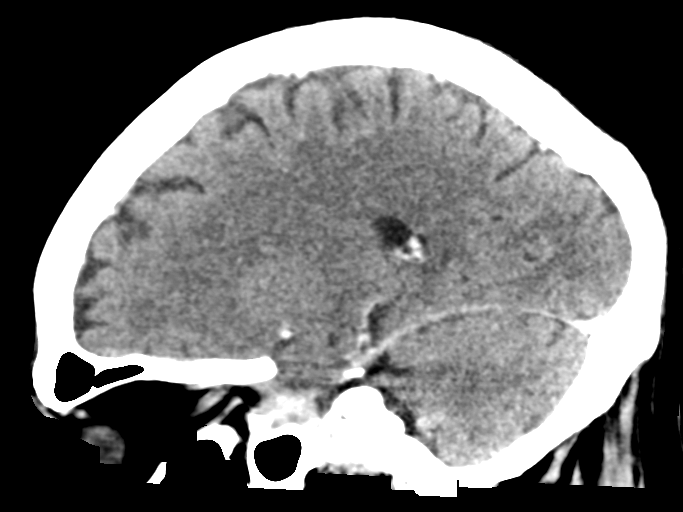

[15 of 47 positions shown; findings below may reference images not displayed]

FINDINGS: Brain: The brain shows a normal appearance without evidence of
malformation, atrophy, old or acute small or large vessel
infarction, mass lesion, hemorrhage, hydrocephalus or extra-axial
collection. After contrast administration, no abnormal enhancement
occurs.

Vascular: No hyperdense vessel. No evidence of atherosclerotic
calcification.

Skull: Normal.  No traumatic finding.  No focal bone lesion.

Sinuses/Orbits: Sinuses are clear. Orbits appear normal. Mastoids
are clear.

Other: Right posterior parietal scalp sebaceous cyst.
IMPRESSION: Normal study.  No evidence of metastatic disease.

## 2019-10-26 ENCOUNTER — Ambulatory Visit (HOSPITAL_COMMUNITY)
Admission: RE | Admit: 2019-10-26 | Discharge: 2019-10-26 | Disposition: A | Discharge status: Home / Self Care | Payer: Medicare Other | Source: Ambulatory Visit | Attending: Internal Medicine | Admitting: Internal Medicine

## 2019-10-26 ENCOUNTER — Encounter (HOSPITAL_COMMUNITY): Payer: Self-pay

## 2019-10-26 ENCOUNTER — Other Ambulatory Visit: Payer: Self-pay

## 2019-10-26 DIAGNOSIS — C3490 Malignant neoplasm of unspecified part of unspecified bronchus or lung: Secondary | ICD-10-CM | POA: Diagnosis not present

## 2019-10-26 MED ORDER — SODIUM CHLORIDE (PF) 0.9 % IJ SOLN
INTRAMUSCULAR | Status: AC
  Filled 2019-10-26: qty 50

## 2019-10-26 MED ORDER — IOHEXOL 300 MG/ML  SOLN
75.0000 mL | Freq: Once | INTRAMUSCULAR | Status: AC | PRN
  Administered 2019-10-26: 75 mL via INTRAVENOUS

## 2019-10-26 MED ORDER — HEPARIN SOD (PORK) LOCK FLUSH 100 UNIT/ML IV SOLN
INTRAVENOUS | Status: AC
  Filled 2019-10-26: qty 5

## 2019-10-26 MED ORDER — HEPARIN SOD (PORK) LOCK FLUSH 100 UNIT/ML IV SOLN
500.0000 [IU] | Freq: Once | INTRAVENOUS | Status: AC
  Administered 2019-10-26: 500 [IU] via INTRAVENOUS

## 2019-10-28 DIAGNOSIS — C3491 Malignant neoplasm of unspecified part of right bronchus or lung: Secondary | ICD-10-CM | POA: Diagnosis not present

## 2019-10-28 DIAGNOSIS — J449 Chronic obstructive pulmonary disease, unspecified: Secondary | ICD-10-CM | POA: Diagnosis not present

## 2019-10-28 DIAGNOSIS — Z720 Tobacco use: Secondary | ICD-10-CM | POA: Diagnosis not present

## 2019-10-28 DIAGNOSIS — E063 Autoimmune thyroiditis: Secondary | ICD-10-CM | POA: Diagnosis not present

## 2019-10-29 ENCOUNTER — Inpatient Hospital Stay: Payer: Medicare Other | Attending: Internal Medicine

## 2019-10-29 ENCOUNTER — Other Ambulatory Visit: Payer: Self-pay

## 2019-10-29 DIAGNOSIS — Z9221 Personal history of antineoplastic chemotherapy: Secondary | ICD-10-CM | POA: Insufficient documentation

## 2019-10-29 DIAGNOSIS — F419 Anxiety disorder, unspecified: Secondary | ICD-10-CM | POA: Insufficient documentation

## 2019-10-29 DIAGNOSIS — Z85118 Personal history of other malignant neoplasm of bronchus and lung: Secondary | ICD-10-CM | POA: Insufficient documentation

## 2019-10-29 DIAGNOSIS — Z923 Personal history of irradiation: Secondary | ICD-10-CM | POA: Diagnosis not present

## 2019-10-29 DIAGNOSIS — Z79899 Other long term (current) drug therapy: Secondary | ICD-10-CM | POA: Diagnosis not present

## 2019-10-29 DIAGNOSIS — F329 Major depressive disorder, single episode, unspecified: Secondary | ICD-10-CM | POA: Diagnosis not present

## 2019-10-29 DIAGNOSIS — I252 Old myocardial infarction: Secondary | ICD-10-CM | POA: Insufficient documentation

## 2019-10-29 DIAGNOSIS — E7800 Pure hypercholesterolemia, unspecified: Secondary | ICD-10-CM | POA: Diagnosis not present

## 2019-10-29 DIAGNOSIS — J449 Chronic obstructive pulmonary disease, unspecified: Secondary | ICD-10-CM | POA: Insufficient documentation

## 2019-10-29 DIAGNOSIS — Z86718 Personal history of other venous thrombosis and embolism: Secondary | ICD-10-CM | POA: Insufficient documentation

## 2019-10-29 DIAGNOSIS — Z7982 Long term (current) use of aspirin: Secondary | ICD-10-CM | POA: Diagnosis not present

## 2019-10-29 DIAGNOSIS — C3490 Malignant neoplasm of unspecified part of unspecified bronchus or lung: Secondary | ICD-10-CM

## 2019-10-29 LAB — CBC WITH DIFFERENTIAL (CANCER CENTER ONLY)
Abs Immature Granulocytes: 0.09 10*3/uL — ABNORMAL HIGH (ref 0.00–0.07)
Basophils Absolute: 0.1 10*3/uL (ref 0.0–0.1)
Basophils Relative: 1 %
Eosinophils Absolute: 0.6 10*3/uL — ABNORMAL HIGH (ref 0.0–0.5)
Eosinophils Relative: 6 %
HCT: 42.1 % (ref 39.0–52.0)
Hemoglobin: 13.6 g/dL (ref 13.0–17.0)
Immature Granulocytes: 1 %
Lymphocytes Relative: 10 %
Lymphs Abs: 1 10*3/uL (ref 0.7–4.0)
MCH: 30.6 pg (ref 26.0–34.0)
MCHC: 32.3 g/dL (ref 30.0–36.0)
MCV: 94.6 fL (ref 80.0–100.0)
Monocytes Absolute: 0.9 10*3/uL (ref 0.1–1.0)
Monocytes Relative: 9 %
Neutro Abs: 7.8 10*3/uL — ABNORMAL HIGH (ref 1.7–7.7)
Neutrophils Relative %: 73 %
Platelet Count: 225 10*3/uL (ref 150–400)
RBC: 4.45 MIL/uL (ref 4.22–5.81)
RDW: 14.4 % (ref 11.5–15.5)
WBC Count: 10.4 10*3/uL (ref 4.0–10.5)
nRBC: 0 % (ref 0.0–0.2)

## 2019-10-29 LAB — CMP (CANCER CENTER ONLY)
ALT: 27 U/L (ref 0–44)
AST: 21 U/L (ref 15–41)
Albumin: 4.1 g/dL (ref 3.5–5.0)
Alkaline Phosphatase: 129 U/L — ABNORMAL HIGH (ref 38–126)
Anion gap: 8 (ref 5–15)
BUN: 12 mg/dL (ref 6–20)
CO2: 31 mmol/L (ref 22–32)
Calcium: 8.8 mg/dL — ABNORMAL LOW (ref 8.9–10.3)
Chloride: 97 mmol/L — ABNORMAL LOW (ref 98–111)
Creatinine: 1.2 mg/dL (ref 0.61–1.24)
GFR, Est AFR Am: >60 mL/min (ref >60)
GFR, Estimated: >60 mL/min (ref >60)
Glucose, Bld: 97 mg/dL (ref 70–99)
Potassium: 4.5 mmol/L (ref 3.5–5.1)
Sodium: 136 mmol/L (ref 135–145)
Total Bilirubin: 0.4 mg/dL (ref 0.3–1.2)
Total Protein: 7.3 g/dL (ref 6.5–8.1)

## 2019-10-31 ENCOUNTER — Ambulatory Visit: Payer: Medicare Other | Admitting: Internal Medicine

## 2019-10-31 ENCOUNTER — Telehealth: Payer: Self-pay | Admitting: Medical Oncology

## 2019-10-31 NOTE — Telephone Encounter (Signed)
ok 

## 2019-10-31 NOTE — Telephone Encounter (Addendum)
Pt requests video visit tomorrow. " No gas". Ramia please call [phone]

## 2019-11-01 ENCOUNTER — Inpatient Hospital Stay (HOSPITAL_BASED_OUTPATIENT_CLINIC_OR_DEPARTMENT_OTHER): Payer: Medicare Other | Admitting: Internal Medicine

## 2019-11-01 ENCOUNTER — Other Ambulatory Visit: Payer: Self-pay

## 2019-11-01 ENCOUNTER — Encounter: Payer: Self-pay | Admitting: Internal Medicine

## 2019-11-01 ENCOUNTER — Telehealth: Payer: Self-pay | Admitting: Internal Medicine

## 2019-11-01 DIAGNOSIS — C3491 Malignant neoplasm of unspecified part of right bronchus or lung: Secondary | ICD-10-CM

## 2019-11-01 DIAGNOSIS — C3490 Malignant neoplasm of unspecified part of unspecified bronchus or lung: Secondary | ICD-10-CM

## 2019-11-01 DIAGNOSIS — R569 Unspecified convulsions: Secondary | ICD-10-CM | POA: Diagnosis not present

## 2019-11-01 NOTE — Progress Notes (Signed)
I connected with [name] on 11/01/19 at 11:00 AM EST by telephone visit and verified that I am speaking with the correct person using two identifiers.  I discussed the limitations, risks, security and privacy concerns of performing an evaluation and management service by telemedicine and the availability of in-person appointments. I also discussed with the patient that there may be a patient responsible charge related to this service. The patient expressed understanding and agreed to proceed.  Other persons participating in the visit and their role in the encounter:  Spouse  Patient's location:  Home  Provider's location:  Office  Chief Complaint:  Seizures  History of Present Ilness: Mr. [name] as his wife describe "occassional" (weekly?) previously described sterotypical seizure events, characterized by "shaking and altered awareness lasting several minutes".  He has decided to self discontinue Keppra because "it made me feel cloudy" and seizure frequency actually decreased after it was discontinued.    Observations: Langage and cognition baseline  Assessment and Plan: Focal Epilepsy.  We counseled Mr. [name] that it is important to try to treat his epilepsy, for his own safety and general health.  We offered an alternative agent to Keppra such as Lamictal or Depakote.  We also counseled him against driving and informed him of the state law in [place], which is no driving within 6 months of last seizure.  Despite understanding risks and benefits he denied any interest in an anti-epileptic drug or seizure treatment.    Follow Up Instructions: We explained that he was going against medical adive and encouraged him to reconsider his decision regarding AED therapy.  We encouraged him to reach out to [place] in the near future for futher discussion.  I discussed the assessment and treatment plan with the patient.  The patient was provided an opportunity to ask questions and all were answered.  The patient agreed with  the plan and demonstrated understanding of the instructions.    The patient was advised to call back or seek an in-person evaluation if the symptoms worsen or if the condition fails to improve as anticipated.  I provided 5-10 minutes of non-face-to-face time during this enocunter.  Ventura Sellers, MD   I provided 15 minutes of non face-to-face telephone visit time during this encounter, and > 50% was spent counseling as documented under my assessment & plan.

## 2019-11-01 NOTE — Telephone Encounter (Signed)
No los per 2/4. 

## 2019-11-01 NOTE — Progress Notes (Signed)
[place] [phone]) [phone]   [phone]) [phone]  PROGRESS NOTE FOR TELEMEDICINE VISITS  Redmond School, MD [address] [place] [address]  I connected with@ on 11/01/19 at 10:15 AM EST by video enabled telemedicine visit and verified that I am speaking with the correct person using two identifiers.   I discussed the limitations, risks, security and privacy concerns of performing an evaluation and management service by telemedicine and the availability of in-person appointments. I also discussed with the patient that there may be a patient responsible charge related to this service. The patient expressed understanding and agreed to proceed.  Other persons participating in the visit and their role in the encounter:  His wife  Patient's location:  Home Provider's location: [place].  DIAGNOSIS:stage IIIA (T3, N2, M0) non-small cell lung cancer, adenosquamous carcinoma diagnosed in June 2019 and presented with large right upper lobe lung mass with questionable chest wall invasion as well as right hilar and mediastinal lymphadenopathy.  Biomarker Findings Tumor Mutational Burden - TMB-High (21 Muts/Mb) Microsatellite status - MS-Stable Genomic Findings For a complete list of the genes assayed, please refer to the Appendix. ATM H2094* CCND2 P281R KRAS G12V CHEK2 T357f*15 TP53 E298* 7 Disease relevant genes with no reportable alterations: ALK, EGFR, BRAF, MET, RET, ERBB2, ROS1  PRIOR THERAPY: 1) Concurrent chemoradiation with chemotherapy consisting of weekly carboplatin for an AUC of 2 and paclitaxel 45 mg/m2. First dose given on 04/03/2018.Status post 6 cycles.  Last dose was giving 05/15/2018 with partial response. 2) Consolidation treatment with immunotherapy with Imfinzi (Durvalumab) 10 mg/KG every 2 weeks.  First dose 06/20/2018.  Status post 26 cycles.  CURRENT THERAPY:Observation.  INTERVAL HISTORY: [name] SITAR59 y.o. male has a MyChart virtual visit with me today for evaluation and discussion of his scan results.  The patient is feeling fine today with no concerning complaints.  He denied having any current chest pain, shortness of breath, cough or hemoptysis.  He denied having any recent weight loss or night sweats.  He has no nausea, vomiting, diarrhea or constipation.  He has no headache or visual changes.  He had repeat CT scan of the chest performed recently and he is here for evaluation and discussion of his scan results.  MEDICAL HISTORY: Past Medical History:  Diagnosis Date  . Acute hepatitis C virus infection 06/19/2007   Qualifier: Diagnosis of  By: MLeilani MerlCMA, Tiffany    . Acute ST elevation myocardial infarction (STEMI) involving left anterior descending (LAD) coronary artery ([facility] 05/26/2016  . Acute ST elevation myocardial infarction (STEMI) of lateral wall ([facility] 05/26/2016  . 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 Anxiety   . Asthma   . Atherosclerosis of native arteries of the extremities with intermittent claudication 12/16/2011  . Chronic hepatitis C without hepatic coma ([facility] 05/03/2014  . COPD with chronic bronchitis and emphysema ([facility] 03/13/2018   FEV1 57%  . Depression   . DVT (deep venous thrombosis) ([facility] 2009; 2019   RLE; LLE  . Encounter for antineoplastic chemotherapy 03/22/2018  . Hepatitis C    "tx'd in 2015"  . High cholesterol   . Ischemic cardiomyopathy 03/02/2018  . Non-small cell carcinoma of right lung, stage 3 ([facility] 03/22/2018  . PAD (peripheral artery disease) ([facility] 01/25/2013  . Peripheral vascular disease, unspecified 07/20/2012  . Port-A-Cath in place 04/24/2018  . ST elevation myocardial infarction involving left anterior descending (LAD) coronary artery ([facility]   . STEMI (ST elevation myocardial infarction) (  [place]) 05/26/2016  . Tobacco abuse 01/28/2016  . Tubular adenoma of colon 07/11/2014  . Urinary dribbling     ALLERGIES:  is allergic  to lyrica [pregabalin] and neurontin [gabapentin].  MEDICATIONS:  Current Outpatient Medications  Medication Sig Dispense Refill  . alprazolam (XANAX) 2 MG tablet Take 2 mg by mouth 4 (four) times daily as needed for anxiety.     [name] amitriptyline (ELAVIL) 100 MG tablet Take 100 mg by mouth at bedtime.     [name] aspirin 81 MG EC tablet Take 81 mg by mouth daily.      [name] atorvastatin (LIPITOR) 80 MG tablet Take 1 tablet (80 mg total) by mouth daily at 6 PM. 30 tablet 12  . carvedilol (COREG) 6.25 MG tablet Take 1 tablet (6.25 mg total) by mouth 2 (two) times daily with a meal. OFFICE VISIT NEEDED 60 tablet 8  . citalopram (CELEXA) 40 MG tablet Take 40 mg by mouth daily.  11  . furosemide (LASIX) 20 MG tablet Take 1 tablet (20 mg total) by mouth daily. Please make overdue appt with Dr. Oval Linsey before anymore refills. 2nd attempt 15 tablet 0  . HYDROcodone-acetaminophen (NORCO) 10-325 MG tablet Take 1 tablet by mouth every 4 (four) hours as needed for moderate pain.   0  . levETIRAcetam (KEPPRA) 750 MG tablet TAKE 1 TABLET BY MOUTH TWICE A DAY 60 tablet 5  . levothyroxine (SYNTHROID) 125 MCG tablet TAKE 1 TABLET BY MOUTH EVERY DAY 30 tablet 2  . lidocaine-prilocaine (EMLA) cream Apply 1 application topically as needed. 30 g 0  . lisinopril (PRINIVIL,ZESTRIL) 2.5 MG tablet Take 1 tablet (2.5 mg total) by mouth daily. 30 tablet 12  . nitroGLYCERIN (NITROSTAT) 0.4 MG SL tablet Place 1 tablet (0.4 mg total) under the tongue every 5 (five) minutes x 3 doses as needed for chest pain. 25 tablet 2  . prochlorperazine (COMPAZINE) 10 MG tablet Take 1 tablet (10 mg total) by mouth every 6 (six) hours as needed for nausea or vomiting. 30 tablet 0  . sildenafil (VIAGRA) 25 MG tablet TAKE 1 TABLET BY MOUTH 60UMINUTES PRIOR TOEENCOUNTER.S    . spironolactone (ALDACTONE) 25 MG tablet TAKE 1 TABLET BY MOUTH EVERY DAY 90 tablet 0  . sucralfate (CARAFATE) 1 g tablet Take 1 tablet (1 g total) by mouth 4 (four) times daily -   with meals and at bedtime. 5 min before meals for radiation induced esophagitis 120 tablet 2  . tamsulosin (FLOMAX) 0.4 MG CAPS capsule Take 1 capsule by mouth daily.    . Tiotropium Bromide-Olodaterol (STIOLTO RESPIMAT) 2.5-2.5 MCG/ACT AERS Inhale 2 puffs into the lungs daily. 1 Inhaler 5  . umeclidinium-vilanterol (ANORO ELLIPTA) 62.5-25 MCG/INH AEPB Inhale 1 puff into the lungs daily. 1 each 0   No current facility-administered medications for this visit.    SURGICAL HISTORY:  Past Surgical History:  Procedure Laterality Date  . AORTOGRAM  08/08/2009   for LLE claudication     By Dr. Oneida Alar  . 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 BIOPSY N/A 05/30/2014   Procedure: BIOPSY;  Surgeon: [name], MD;  Location: [facility];  Service: Endoscopy;  Laterality: N/A;  . BLADDER TUMOR EXCISION  8/812  . CARDIAC CATHETERIZATION N/A 05/26/2016   Procedure: Left Heart Cath and Coronary Angiography;  Surgeon: [name], MD;  Location: [facility];  Service: Cardiovascular;  Laterality: N/A;  . CARDIAC CATHETERIZATION N/A 05/26/2016   Procedure: Coronary Stent Intervention;  Surgeon:  [name], MD;  Location: [facility];  Service: Cardiovascular;  Laterality: N/A;  . COLONOSCOPY WITH PROPOFOL N/A 05/30/2014   RKY:HCWCBJ colonic polyp-likely source of hematochezia-removed as described above  . ESOPHAGOGASTRODUODENOSCOPY (EGD) WITH PROPOFOL N/A 05/30/2014   SEG:BTDVVO and bulbar erosions s/p gastric biopsy. No evidence of portal gastropathy on today's examination.  . FEMORAL-TIBIAL BYPASS GRAFT  2009   Right side using non-reversed GSV   By Dr. Oneida Alar  . FEMORAL-TIBIAL BYPASS GRAFT  11/24/2007   Right femoral to anterior tibial BPG   by Dr. Oneida Alar  . FINGER SURGERY Left    "straightened my pinky"  . HAND TENDON SURGERY Left 2013   Left 5th finger  . HERNIA REPAIR     "stomach"  . ICD IMPLANT N/A 03/02/2018   Procedure: ICD IMPLANT;  Surgeon: [name], MD;   Location: [facility];  Service: Cardiovascular;  Laterality: N/A;  . INCISIONAL HERNIA REPAIR N/A 12/25/2014   Procedure: HERNIA REPAIR INCISIONAL WITH MESH;  Surgeon: [name], MD;  Location: [facility];  Service: General;  Laterality: N/A;  . INSERTION OF MESH N/A 12/25/2014   Procedure: INSERTION OF MESH;  Surgeon: [name], MD;  Location: [facility];  Service: General;  Laterality: N/A;  . IR IMAGING GUIDED PORT INSERTION  04/11/2018  . LAPAROSCOPIC CHOLECYSTECTOMY    . LOWER EXTREMITY ANGIOGRAM Left 12/30/2015   Procedure: Lower Extremity Angiogram;  Surgeon: [name], MD;  Location: [facility];  Service: Cardiovascular;  Laterality: Left;  . PERIPHERAL VASCULAR CATHETERIZATION N/A 12/30/2015   Procedure: Abdominal Aortogram;  Surgeon: [name], MD;  Location: [facility];  Service: Cardiovascular;  Laterality: N/A;  . POLYPECTOMY  05/30/2014   Procedure: POLYPECTOMY;  Surgeon: [name], MD;  Location: [facility];  Service: Endoscopy;;  . TONSILLECTOMY AND ADENOIDECTOMY  ~ 1970  . TYMPANOSTOMY TUBE PLACEMENT Bilateral ~ 1970    REVIEW OF SYSTEMS:  A comprehensive review of systems was negative.   LABORATORY DATA: Lab Results  Component Value Date   WBC 10.4 10/29/2019   HGB 13.6 10/29/2019   HCT 42.1 10/29/2019   MCV 94.6 10/29/2019   PLT 225 10/29/2019      Chemistry      Component Value Date/Time   NA 136 10/29/2019 1253   NA 139 02/27/2018 1457   K 4.5 10/29/2019 1253   CL 97 (L) 10/29/2019 1253   CO2 31 10/29/2019 1253   BUN 12 10/29/2019 1253   BUN 7 02/27/2018 1457   CREATININE 1.20 10/29/2019 1253   CREATININE 1.13 06/28/2016 1453      Component Value Date/Time   CALCIUM 8.8 (L) 10/29/2019 1253   ALKPHOS 129 (H) 10/29/2019 1253   AST 21 10/29/2019 1253   ALT 27 10/29/2019 1253   BILITOT 0.4 10/29/2019 1253       RADIOGRAPHIC STUDIES: CT Chest W Contrast  Result Date: 10/26/2019 CLINICAL DATA:  Lung cancer staging  status post chemotherapy, ongoing immunotherapy EXAM: CT CHEST WITH CONTRAST TECHNIQUE: Multidetector CT imaging of the chest was performed during intravenous contrast administration. CONTRAST:  34m OMNIPAQUE IOHEXOL 300 MG/ML  SOLN COMPARISON:  07/23/2019, 03/09/2019 FINDINGS: Cardiovascular: Right chest port catheter. Normal heart size. Coronary artery calcifications and stents. No pericardial effusion. Mediastinum/Nodes: Unchanged post treatment appearance of soft tissue about the right hilum. No pathologically enlarged mediastinal lymph nodes. Thyroid gland, trachea, and esophagus demonstrate no significant findings. Lungs/Pleura: Unchanged post treatment appearance  of a right upper lobe mass with extensive fibrotic consolidation and volume loss of the right upper lobe. Largest discrete component of the mass at the right apex measures approximately 5.6 x 4.7 cm, not significantly changed when compared to prior examination (series 7, image 38). Unchanged small adjacent irregular nodules (e.g. series 7, image 49). Tiny clustered infectious or inflammatory centrilobular nodules in the right lower lobe (series 7, image 104) which are not significantly changed compared to prior examination. Unchanged small nonspecific ground-glass nodules of the peripheral left upper lobe (series 7, image 45). Unchanged diffuse bilateral bronchial wall thickening. No pleural effusion or pneumothorax. Upper Abdomen: No acute abnormality. Musculoskeletal: No chest wall mass or suspicious bone lesions identified. IMPRESSION: 1. Unchanged post treatment appearance of a right upper lobe mass with extensive fibrotic consolidation and volume loss of the right upper lobe. 2. Unchanged small adjacent irregular nodules of the right upper lobe. Attention on follow-up. 3. Coronary artery disease.  Aortic Atherosclerosis (ICD10-I70.0). Electronically Signed   By: Eddie Candle M.D.   On: 10/26/2019 14:30    ASSESSMENT AND PLAN: This is a very  pleasant 59 years old white male recently diagnosed with a stage IIIa non-small cell lung cancer, adenosquamous carcinoma.  He underwent a course of concurrent chemoradiation with weekly carboplatin and paclitaxel status post 6 cycles with partial response. The patient completed treatment with consolidation immunotherapy with Imfinzi status post 26 cycles.  He tolerated this treatment well with no concerning adverse effects. The patient is currently on observation and he is feeling fine today with no concerning complaints. He had repeat CT scan of the chest performed recently.  I personally and independently reviewed the scans and discussed the results with the patient and his wife. His scan showed no concerning findings for disease progression. I recommended for the patient to continue on observation with repeat CT scan of the chest in 3 months. He was advised to call immediately if he has any concerning symptoms in the interval. I discussed the assessment and treatment plan with the patient. The patient was provided an opportunity to ask questions and all were answered. The patient agreed with the plan and demonstrated an understanding of the instructions.   The patient was advised to call back or seek an in-person evaluation if the symptoms worsen or if the condition fails to improve as anticipated.  I provided 20 minutes of face-to-face video visit time during this encounter. Eilleen Kempf, MD 11/01/2019 9:06 AM  Disclaimer: This note was dictated with voice recognition software. Similar sounding words can inadvertently be transcribed and may not be corrected upon review.

## 2019-11-02 ENCOUNTER — Telehealth: Payer: Self-pay | Admitting: Internal Medicine

## 2019-11-02 NOTE — Telephone Encounter (Signed)
Scheduled per los. Called and spoke with patients wife. Confirmed appt

## 2019-11-08 DIAGNOSIS — G894 Chronic pain syndrome: Secondary | ICD-10-CM | POA: Diagnosis not present

## 2019-11-25 DIAGNOSIS — J449 Chronic obstructive pulmonary disease, unspecified: Secondary | ICD-10-CM | POA: Diagnosis not present

## 2019-11-25 DIAGNOSIS — Z720 Tobacco use: Secondary | ICD-10-CM | POA: Diagnosis not present

## 2019-11-25 DIAGNOSIS — E063 Autoimmune thyroiditis: Secondary | ICD-10-CM | POA: Diagnosis not present

## 2019-11-25 DIAGNOSIS — C3491 Malignant neoplasm of unspecified part of right bronchus or lung: Secondary | ICD-10-CM | POA: Diagnosis not present

## 2019-11-30 DIAGNOSIS — Z0000 Encounter for general adult medical examination without abnormal findings: Secondary | ICD-10-CM | POA: Diagnosis not present

## 2019-11-30 DIAGNOSIS — Z89511 Acquired absence of right leg below knee: Secondary | ICD-10-CM | POA: Diagnosis not present

## 2019-11-30 DIAGNOSIS — I739 Peripheral vascular disease, unspecified: Secondary | ICD-10-CM | POA: Diagnosis not present

## 2019-11-30 DIAGNOSIS — E063 Autoimmune thyroiditis: Secondary | ICD-10-CM | POA: Diagnosis not present

## 2019-11-30 DIAGNOSIS — G894 Chronic pain syndrome: Secondary | ICD-10-CM | POA: Diagnosis not present

## 2019-12-05 ENCOUNTER — Ambulatory Visit (INDEPENDENT_AMBULATORY_CARE_PROVIDER_SITE_OTHER): Payer: Medicare Other | Admitting: *Deleted

## 2019-12-05 DIAGNOSIS — I255 Ischemic cardiomyopathy: Secondary | ICD-10-CM

## 2019-12-05 LAB — CUP PACEART REMOTE DEVICE CHECK
Battery Remaining Longevity: 126 mo
Battery Voltage: 3.01 V
Brady Statistic RV Percent Paced: 0.01 %
Date Time Interrogation Session: 20210310033623
HighPow Impedance: 82 Ohm
Implantable Lead Implant Date: 20190606
Implantable Lead Location: 753860
Implantable Pulse Generator Implant Date: 20190606
Lead Channel Impedance Value: 342 Ohm
Lead Channel Impedance Value: 399 Ohm
Lead Channel Pacing Threshold Amplitude: 0.875 V
Lead Channel Pacing Threshold Pulse Width: 0.4 ms
Lead Channel Sensing Intrinsic Amplitude: 21.125 mV
Lead Channel Sensing Intrinsic Amplitude: 21.125 mV
Lead Channel Setting Pacing Amplitude: 2.5 V
Lead Channel Setting Pacing Pulse Width: 0.4 ms
Lead Channel Setting Sensing Sensitivity: 0.3 mV

## 2019-12-06 NOTE — Progress Notes (Signed)
ICD Remote  

## 2019-12-31 DIAGNOSIS — G894 Chronic pain syndrome: Secondary | ICD-10-CM | POA: Diagnosis not present

## 2020-01-08 DIAGNOSIS — C3490 Malignant neoplasm of unspecified part of unspecified bronchus or lung: Secondary | ICD-10-CM | POA: Diagnosis not present

## 2020-01-08 DIAGNOSIS — G894 Chronic pain syndrome: Secondary | ICD-10-CM | POA: Diagnosis not present

## 2020-01-08 DIAGNOSIS — E063 Autoimmune thyroiditis: Secondary | ICD-10-CM | POA: Diagnosis not present

## 2020-01-25 ENCOUNTER — Inpatient Hospital Stay: Payer: Medicare Other

## 2020-01-25 ENCOUNTER — Inpatient Hospital Stay: Payer: Medicare Other | Attending: Internal Medicine

## 2020-01-25 ENCOUNTER — Other Ambulatory Visit: Payer: Self-pay

## 2020-01-25 ENCOUNTER — Ambulatory Visit (HOSPITAL_COMMUNITY)
Admission: RE | Admit: 2020-01-25 | Discharge: 2020-01-25 | Disposition: A | Discharge status: Home / Self Care | Payer: Medicare Other | Source: Ambulatory Visit | Attending: Internal Medicine | Admitting: Internal Medicine

## 2020-01-25 DIAGNOSIS — Z85118 Personal history of other malignant neoplasm of bronchus and lung: Secondary | ICD-10-CM | POA: Diagnosis not present

## 2020-01-25 DIAGNOSIS — C3490 Malignant neoplasm of unspecified part of unspecified bronchus or lung: Secondary | ICD-10-CM

## 2020-01-25 DIAGNOSIS — C3491 Malignant neoplasm of unspecified part of right bronchus or lung: Secondary | ICD-10-CM

## 2020-01-25 DIAGNOSIS — J449 Chronic obstructive pulmonary disease, unspecified: Secondary | ICD-10-CM | POA: Diagnosis not present

## 2020-01-25 DIAGNOSIS — Z95828 Presence of other vascular implants and grafts: Secondary | ICD-10-CM

## 2020-01-25 DIAGNOSIS — Z720 Tobacco use: Secondary | ICD-10-CM | POA: Diagnosis not present

## 2020-01-25 DIAGNOSIS — E063 Autoimmune thyroiditis: Secondary | ICD-10-CM | POA: Diagnosis not present

## 2020-01-25 LAB — CMP (CANCER CENTER ONLY)
ALT: 25 U/L (ref 0–44)
AST: 22 U/L (ref 15–41)
Albumin: 3.9 g/dL (ref 3.5–5.0)
Alkaline Phosphatase: 117 U/L (ref 38–126)
Anion gap: 9 (ref 5–15)
BUN: 13 mg/dL (ref 6–20)
CO2: 29 mmol/L (ref 22–32)
Calcium: 8.8 mg/dL — ABNORMAL LOW (ref 8.9–10.3)
Chloride: 97 mmol/L — ABNORMAL LOW (ref 98–111)
Creatinine: 1.14 mg/dL (ref 0.61–1.24)
GFR, Est AFR Am: >60 mL/min (ref >60)
GFR, Estimated: >60 mL/min (ref >60)
Glucose, Bld: 110 mg/dL — ABNORMAL HIGH (ref 70–99)
Potassium: 4.6 mmol/L (ref 3.5–5.1)
Sodium: 135 mmol/L (ref 135–145)
Total Bilirubin: 0.4 mg/dL (ref 0.3–1.2)
Total Protein: 7.1 g/dL (ref 6.5–8.1)

## 2020-01-25 LAB — CBC WITH DIFFERENTIAL (CANCER CENTER ONLY)
Abs Immature Granulocytes: 0.13 10*3/uL — ABNORMAL HIGH (ref 0.00–0.07)
Basophils Absolute: 0.1 10*3/uL (ref 0.0–0.1)
Basophils Relative: 1 %
Eosinophils Absolute: 0.4 10*3/uL (ref 0.0–0.5)
Eosinophils Relative: 5 %
HCT: 43.5 % (ref 39.0–52.0)
Hemoglobin: 14.2 g/dL (ref 13.0–17.0)
Immature Granulocytes: 1 %
Lymphocytes Relative: 9 %
Lymphs Abs: 0.9 10*3/uL (ref 0.7–4.0)
MCH: 31 pg (ref 26.0–34.0)
MCHC: 32.6 g/dL (ref 30.0–36.0)
MCV: 95 fL (ref 80.0–100.0)
Monocytes Absolute: 0.8 10*3/uL (ref 0.1–1.0)
Monocytes Relative: 8 %
Neutro Abs: 7.2 10*3/uL (ref 1.7–7.7)
Neutrophils Relative %: 76 %
Platelet Count: 251 10*3/uL (ref 150–400)
RBC: 4.58 MIL/uL (ref 4.22–5.81)
RDW: 14.7 % (ref 11.5–15.5)
WBC Count: 9.5 10*3/uL (ref 4.0–10.5)
nRBC: 0 % (ref 0.0–0.2)

## 2020-01-25 MED ORDER — IOHEXOL 300 MG/ML  SOLN
75.0000 mL | Freq: Once | INTRAMUSCULAR | Status: AC | PRN
  Administered 2020-01-25: 75 mL via INTRAVENOUS

## 2020-01-25 MED ORDER — SODIUM CHLORIDE (PF) 0.9 % IJ SOLN
INTRAMUSCULAR | Status: AC
  Filled 2020-01-25: qty 50

## 2020-01-25 MED ORDER — HEPARIN SOD (PORK) LOCK FLUSH 100 UNIT/ML IV SOLN
INTRAVENOUS | Status: AC
  Filled 2020-01-25: qty 5

## 2020-01-25 MED ORDER — HEPARIN SOD (PORK) LOCK FLUSH 100 UNIT/ML IV SOLN: 500.0000 [IU] | Freq: Once | INTRAVENOUS | Status: DC

## 2020-01-25 MED ORDER — SODIUM CHLORIDE 0.9% FLUSH
10.0000 mL | Freq: Once | INTRAVENOUS | Status: AC
  Administered 2020-01-25: 10 mL
  Filled 2020-01-25: qty 10

## 2020-01-29 ENCOUNTER — Telehealth: Payer: Self-pay | Admitting: Internal Medicine

## 2020-01-29 ENCOUNTER — Encounter: Payer: Self-pay | Admitting: Internal Medicine

## 2020-01-29 ENCOUNTER — Inpatient Hospital Stay: Payer: Medicare Other | Attending: Internal Medicine | Admitting: Internal Medicine

## 2020-01-29 ENCOUNTER — Other Ambulatory Visit: Payer: Self-pay

## 2020-01-29 VITALS — BP 100/68 | HR 71 | Temp 98.2°F | Resp 18 | Ht 73.0 in

## 2020-01-29 DIAGNOSIS — Z86718 Personal history of other venous thrombosis and embolism: Secondary | ICD-10-CM | POA: Insufficient documentation

## 2020-01-29 DIAGNOSIS — B182 Chronic viral hepatitis C: Secondary | ICD-10-CM | POA: Insufficient documentation

## 2020-01-29 DIAGNOSIS — Z79899 Other long term (current) drug therapy: Secondary | ICD-10-CM | POA: Insufficient documentation

## 2020-01-29 DIAGNOSIS — I252 Old myocardial infarction: Secondary | ICD-10-CM | POA: Diagnosis not present

## 2020-01-29 DIAGNOSIS — Z9221 Personal history of antineoplastic chemotherapy: Secondary | ICD-10-CM | POA: Diagnosis not present

## 2020-01-29 DIAGNOSIS — F419 Anxiety disorder, unspecified: Secondary | ICD-10-CM | POA: Insufficient documentation

## 2020-01-29 DIAGNOSIS — F17210 Nicotine dependence, cigarettes, uncomplicated: Secondary | ICD-10-CM | POA: Insufficient documentation

## 2020-01-29 DIAGNOSIS — C3411 Malignant neoplasm of upper lobe, right bronchus or lung: Secondary | ICD-10-CM | POA: Diagnosis not present

## 2020-01-29 DIAGNOSIS — C3491 Malignant neoplasm of unspecified part of right bronchus or lung: Secondary | ICD-10-CM | POA: Diagnosis not present

## 2020-01-29 DIAGNOSIS — Z7982 Long term (current) use of aspirin: Secondary | ICD-10-CM | POA: Insufficient documentation

## 2020-01-29 DIAGNOSIS — Z923 Personal history of irradiation: Secondary | ICD-10-CM | POA: Insufficient documentation

## 2020-01-29 DIAGNOSIS — F329 Major depressive disorder, single episode, unspecified: Secondary | ICD-10-CM | POA: Insufficient documentation

## 2020-01-29 DIAGNOSIS — J449 Chronic obstructive pulmonary disease, unspecified: Secondary | ICD-10-CM | POA: Insufficient documentation

## 2020-01-29 DIAGNOSIS — C3490 Malignant neoplasm of unspecified part of unspecified bronchus or lung: Secondary | ICD-10-CM | POA: Diagnosis not present

## 2020-01-29 DIAGNOSIS — E7800 Pure hypercholesterolemia, unspecified: Secondary | ICD-10-CM | POA: Diagnosis not present

## 2020-01-29 NOTE — Patient Instructions (Signed)
Steps to Quit Smoking Smoking tobacco is the leading cause of preventable death. It can affect almost every organ in the body. Smoking puts you and people around you at risk for many serious, long-lasting (chronic) diseases. Quitting smoking can be hard, but it is one of the best things that you can do for your health. It is never too late to quit. How do I get ready to quit? When you decide to quit smoking, make a plan to help you succeed. Before you quit:  Pick a date to quit. Set a date within the next 2 weeks to give you time to prepare.  Write down the reasons why you are quitting. Keep this list in places where you will see it often.  Tell your family, friends, and co-workers that you are quitting. Their support is important.  Talk with your doctor about the choices that may help you quit.  Find out if your health insurance will pay for these treatments.  Know the people, places, things, and activities that make you want to smoke (triggers). Avoid them. What first steps can I take to quit smoking?  Throw away all cigarettes at home, at work, and in your car.  Throw away the things that you use when you smoke, such as ashtrays and lighters.  Clean your car. Make sure to empty the ashtray.  Clean your home, including curtains and carpets. What can I do to help me quit smoking? Talk with your doctor about taking medicines and seeing a counselor at the same time. You are more likely to succeed when you do both.  If you are pregnant or breastfeeding, talk with your doctor about counseling or other ways to quit smoking. Do not take medicine to help you quit smoking unless your doctor tells you to do so. To quit smoking: Quit right away  Quit smoking totally, instead of slowly cutting back on how much you smoke over a period of time.  Go to counseling. You are more likely to quit if you go to counseling sessions regularly. Take medicine You may take medicines to help you quit. Some  medicines need a prescription, and some you can buy over-the-counter. Some medicines may contain a drug called nicotine to replace the nicotine in cigarettes. Medicines may:  Help you to stop having the desire to smoke (cravings).  Help to stop the problems that come when you stop smoking (withdrawal symptoms). Your doctor may ask you to use:  Nicotine patches, gum, or lozenges.  Nicotine inhalers or sprays.  Non-nicotine medicine that is taken by mouth. Find resources Find resources and other ways to help you quit smoking and remain smoke-free after you quit. These resources are most helpful when you use them often. They include:  Online chats with a counselor.  Phone quitlines.  Printed self-help materials.  Support groups or group counseling.  Text messaging programs.  Mobile phone apps. Use apps on your mobile phone or tablet that can help you stick to your quit plan. There are many free apps for mobile phones and tablets as well as websites. Examples include Quit Guide from the CDC and smokefree.gov  What things can I do to make it easier to quit?   Talk to your family and friends. Ask them to support and encourage you.  Call a phone quitline (1-800-QUIT-NOW), reach out to support groups, or work with a counselor.  Ask people who smoke to not smoke around you.  Avoid places that make you want to smoke,   such as: ? Bars. ? Parties. ? Smoke-break areas at work.  Spend time with people who do not smoke.  Lower the stress in your life. Stress can make you want to smoke. Try these things to help your stress: ? Getting regular exercise. ? Doing deep-breathing exercises. ? Doing yoga. ? Meditating. ? Doing a body scan. To do this, close your eyes, focus on one area of your body at a time from head to toe. Notice which parts of your body are tense. Try to relax the muscles in those areas. How will I feel when I quit smoking? Day 1 to 3 weeks Within the first 24 hours,  you may start to have some problems that come from quitting tobacco. These problems are very bad 2-3 days after you quit, but they do not often last for more than 2-3 weeks. You may get these symptoms:  Mood swings.  Feeling restless, nervous, angry, or annoyed.  Trouble concentrating.  Dizziness.  Strong desire for high-sugar foods and nicotine.  Weight gain.  Trouble pooping (constipation).  Feeling like you may vomit (nausea).  Coughing or a sore throat.  Changes in how the medicines that you take for other issues work in your body.  Depression.  Trouble sleeping (insomnia). Week 3 and afterward After the first 2-3 weeks of quitting, you may start to notice more positive results, such as:  Better sense of smell and taste.  Less coughing and sore throat.  Slower heart rate.  Lower blood pressure.  Clearer skin.  Better breathing.  Fewer sick days. Quitting smoking can be hard. Do not give up if you fail the first time. Some people need to try a few times before they succeed. Do your best to stick to your quit plan, and talk with your doctor if you have any questions or concerns. Summary  Smoking tobacco is the leading cause of preventable death. Quitting smoking can be hard, but it is one of the best things that you can do for your health.  When you decide to quit smoking, make a plan to help you succeed.  Quit smoking right away, not slowly over a period of time.  When you start quitting, seek help from your doctor, family, or friends. This information is not intended to replace advice given to you by your health care provider. Make sure you discuss any questions you have with your health care provider. Document Revised: 06/08/2019 Document Reviewed: 12/02/2018 Elsevier Patient Education  2020 [employer]  

## 2020-01-29 NOTE — Telephone Encounter (Signed)
Scheduled per los. Gave avs and calendar  

## 2020-01-29 NOTE — Progress Notes (Signed)
[place] [phone]   [phone]  OFFICE PROGRESS NOTE  [place], [place] [address]  DIAGNOSIS:stage IIIA (T3, N2, M0) non-small cell lung cancer, adenosquamous carcinoma diagnosed in June 2019 and presented with large right upper lobe lung mass with questionable chest wall invasion as well as right hilar and mediastinal lymphadenopathy.  Biomarker Findings Tumor Mutational Burden - TMB-High (21 Muts/Mb) Microsatellite status - MS-Stable Genomic Findings For a complete list of the genes assayed, please refer to the Appendix. ATM M3559* CCND2 P281R KRAS G12V CHEK2 T342f*15 TP53 E298* 7 Disease relevant genes with no reportable alterations: ALK, EGFR, BRAF, MET, RET, ERBB2, ROS1  PRIOR THERAPY: 1) Concurrent chemoradiation with chemotherapy consisting of weekly carboplatin for an AUC of 2 and paclitaxel 45 mg/m2. First dose given on 04/03/2018.  Status post 6 cycles.  Last dose was giving 05/15/2018 with partial response. 2) Consolidation treatment with immunotherapy with Imfinzi (Durvalumab) 10 mg/KG every 2 weeks.  First dose 06/20/2018.  Status post 26 cycles.  CURRENT THERAPY:Observation.  INTERVAL HISTORY: Mark SHATSWELL89y.o. male returns to the clinic today for follow-up visit.  The patient is feeling fine today with no concerning complaints except for the shortness of breath with exertion.  He denied having any chest pain but has cough with no hemoptysis.  He continues to smoke less than 1 pack/day.  He denied having any nausea, vomiting, diarrhea or constipation.  He denied having any headache or visual changes.  The patient has been on observation for the last 6 months.  He was on treatment with Keppra for seizure activity by Dr. VMickeal Skinnerbut the patient has more seizure after starting Keppra and he discontinued it few weeks ago. He had repeat CT scan of the chest performed recently. He is here today  for evaluation and discussion of his scan results.  MEDICAL HISTORY: Past Medical History:  Diagnosis Date   Acute hepatitis C virus infection 06/19/2007   Qualifier: Diagnosis of  By: MLeilani MerlCMA, Tiffany     Acute ST elevation myocardial infarction (STEMI) involving left anterior descending (LAD) coronary artery (HCC) 05/26/2016   Acute ST elevation myocardial infarction (STEMI) of lateral wall (HCC) 05/26/2016   AICD (automatic cardioverter/defibrillator) present 03/02/2018   Anxiety    Asthma    Atherosclerosis of native arteries of the extremities with intermittent claudication 12/16/2011   Chronic hepatitis C without hepatic coma ([facility] [place] 05/03/2014   COPD with chronic bronchitis and emphysema ([facility] 03/13/2018   FEV1 57%   Depression    DVT (deep venous thrombosis) ([facility] 2009; 2019   RLE; LLE   Encounter for antineoplastic chemotherapy 03/22/2018   Hepatitis C    "tx'd in 2015"   High cholesterol    Ischemic cardiomyopathy 03/02/2018   Non-small cell carcinoma of right lung, stage 3 ([facility] 03/22/2018   PAD (peripheral artery disease) ([facility] 01/25/2013   Peripheral vascular disease, unspecified 07/20/2012   Port-A-Cath in place 04/24/2018   ST elevation myocardial infarction involving left anterior descending (LAD) coronary artery (HCC)    STEMI (ST elevation myocardial infarction) ([facility] 05/26/2016   Tobacco abuse 01/28/2016   Tubular adenoma of colon 07/11/2014   Urinary dribbling     ALLERGIES:  is allergic to lyrica [pregabalin] and neurontin [gabapentin].  MEDICATIONS:  Current Outpatient Medications  Medication Sig Dispense Refill   alprazolam (XANAX) 2 MG tablet Take 2 mg by mouth 4 (four) times daily as needed for anxiety.  amitriptyline (ELAVIL) 100 MG tablet Take 100 mg by mouth at bedtime.      aspirin 81 MG EC tablet Take 81 mg by mouth daily.       atorvastatin (LIPITOR) 80 MG tablet Take 1 tablet (80 mg total) by mouth daily at 6 PM. 30 tablet 12     carvedilol (COREG) 6.25 MG tablet Take 1 tablet (6.25 mg total) by mouth 2 (two) times daily with a meal. OFFICE VISIT NEEDED 60 tablet 8   citalopram (CELEXA) 40 MG tablet Take 40 mg by mouth daily.  11   furosemide (LASIX) 20 MG tablet Take 1 tablet (20 mg total) by mouth daily. Please make overdue appt with Dr. Oval Linsey before anymore refills. 2nd attempt 15 tablet 0   HYDROcodone-acetaminophen (NORCO) 10-325 MG tablet Take 1 tablet by mouth every 4 (four) hours as needed for moderate pain.   0   levETIRAcetam (KEPPRA) 750 MG tablet TAKE 1 TABLET BY MOUTH TWICE A DAY 60 tablet 5   levothyroxine (SYNTHROID) 125 MCG tablet TAKE 1 TABLET BY MOUTH EVERY DAY 30 tablet 2   lidocaine-prilocaine (EMLA) cream Apply 1 application topically as needed. 30 g 0   lisinopril (PRINIVIL,ZESTRIL) 2.5 MG tablet Take 1 tablet (2.5 mg total) by mouth daily. 30 tablet 12   nitroGLYCERIN (NITROSTAT) 0.4 MG SL tablet Place 1 tablet (0.4 mg total) under the tongue every 5 (five) minutes x 3 doses as needed for chest pain. 25 tablet 2   prochlorperazine (COMPAZINE) 10 MG tablet Take 1 tablet (10 mg total) by mouth every 6 (six) hours as needed for nausea or vomiting. 30 tablet 0   sildenafil (VIAGRA) 25 MG tablet TAKE 1 TABLET BY MOUTH 60UMINUTES PRIOR TOEENCOUNTER.S     spironolactone (ALDACTONE) 25 MG tablet TAKE 1 TABLET BY MOUTH EVERY DAY 90 tablet 0   sucralfate (CARAFATE) 1 g tablet Take 1 tablet (1 g total) by mouth 4 (four) times daily -  with meals and at bedtime. 5 min before meals for radiation induced esophagitis 120 tablet 2   tamsulosin (FLOMAX) 0.4 MG CAPS capsule Take 1 capsule by mouth daily.     Tiotropium Bromide-Olodaterol (STIOLTO RESPIMAT) 2.5-2.5 MCG/ACT AERS Inhale 2 puffs into the lungs daily. 1 Inhaler 5   umeclidinium-vilanterol (ANORO ELLIPTA) 62.5-25 MCG/INH AEPB Inhale 1 puff into the lungs daily. 1 each 0   No current facility-administered medications for this visit.     SURGICAL HISTORY:  Past Surgical History:  Procedure Laterality Date   AORTOGRAM  08/08/2009   for LLE claudication     By Dr. 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   BIOPSY N/A 05/30/2014   Procedure: BIOPSY;  Surgeon: [name], MD;  Location: [facility];  Service: Endoscopy;  Laterality: N/A;   BLADDER TUMOR EXCISION  8/812   CARDIAC CATHETERIZATION N/A 05/26/2016   Procedure: Left Heart Cath and Coronary Angiography;  Surgeon: [name], MD;  Location: [facility];  Service: Cardiovascular;  Laterality: N/A;   CARDIAC CATHETERIZATION N/A 05/26/2016   Procedure: Coronary Stent Intervention;  Surgeon: [name], MD;  Location: [facility];  Service: Cardiovascular;  Laterality: N/A;   COLONOSCOPY WITH PROPOFOL N/A 05/30/2014   RFF:MBWGYK colonic polyp-likely source of hematochezia-removed as described above   ESOPHAGOGASTRODUODENOSCOPY (EGD) WITH PROPOFOL N/A 05/30/2014   ZLD:JTTSVX and bulbar erosions s/p gastric biopsy. No evidence of portal gastropathy on today's examination.   FEMORAL-TIBIAL BYPASS GRAFT  2009  Right side using non-reversed GSV   By Dr. Anson [place] BYPASS GRAFT  11/24/2007   Right femoral to anterior tibial BPG   by Dr. Oneida Alar   FINGER SURGERY Left    "straightened my pinky"   HAND TENDON SURGERY Left 2013   Left 5th finger   HERNIA REPAIR     "stomach"   ICD IMPLANT N/A 03/02/2018   Procedure: ICD IMPLANT;  Surgeon: [name], MD;  Location: [facility];  Service: Cardiovascular;  Laterality: N/A;   INCISIONAL HERNIA REPAIR N/A 12/25/2014   Procedure: HERNIA REPAIR INCISIONAL WITH MESH;  Surgeon: [name], MD;  Location: [facility];  Service: General;  Laterality: N/A;   INSERTION OF MESH N/A 12/25/2014   Procedure: INSERTION OF MESH;  Surgeon: [name], MD;  Location: [facility];  Service: General;  Laterality: N/A;   IR IMAGING GUIDED PORT INSERTION  04/11/2018    LAPAROSCOPIC CHOLECYSTECTOMY     LOWER EXTREMITY ANGIOGRAM Left 12/30/2015   Procedure: Lower Extremity Angiogram;  Surgeon: [name], MD;  Location: [facility];  Service: Cardiovascular;  Laterality: Left;   PERIPHERAL VASCULAR CATHETERIZATION N/A 12/30/2015   Procedure: Abdominal Aortogram;  Surgeon: [name], MD;  Location: [facility];  Service: Cardiovascular;  Laterality: N/A;   POLYPECTOMY  05/30/2014   Procedure: POLYPECTOMY;  Surgeon: [name], MD;  Location: [facility];  Service: Endoscopy;;   TONSILLECTOMY AND ADENOIDECTOMY  ~ 1970   TYMPANOSTOMY TUBE PLACEMENT Bilateral ~ 1970    REVIEW OF SYSTEMS:  Constitutional: positive for fatigue Eyes: negative Ears, nose, mouth, throat, and face: negative Respiratory: positive for dyspnea on exertion Cardiovascular: negative Gastrointestinal: negative Genitourinary:negative Integument/breast: negative Hematologic/lymphatic: negative Musculoskeletal:negative Neurological: negative Behavioral/Psych: negative Endocrine: negative Allergic/Immunologic: negative   PHYSICAL EXAMINATION: General appearance: alert, cooperative, fatigued and no distress Head: Normocephalic, without obvious abnormality, atraumatic Neck: no adenopathy, no JVD, supple, symmetrical, trachea midline and thyroid not enlarged, symmetric, no tenderness/mass/nodules Lymph nodes: Cervical, supraclavicular, and axillary nodes normal. Resp: clear to auscultation bilaterally Back: symmetric, no curvature. ROM normal. No CVA tenderness. Cardio: regular rate and rhythm, S1, S2 normal, no murmur, click, rub or gallop GI: soft, non-tender; bowel sounds normal; no masses,  no organomegaly Extremities: Right below knee amputation otherwise no edema. Neurologic: Alert and oriented X 3, normal strength and tone. Normal symmetric reflexes. Normal coordination and gait  ECOG PERFORMANCE STATUS: 1 - Symptomatic but completely ambulatory  Blood  pressure 100/68, pulse 71, temperature 98.2 F (36.8 C), temperature source Oral, resp. rate 18, height '6\' 1"'$  (1.854 m), SpO2 96 %.  LABORATORY DATA: Lab Results  Component Value Date   WBC 9.5 01/25/2020   HGB 14.2 01/25/2020   HCT 43.5 01/25/2020   MCV 95.0 01/25/2020   PLT 251 01/25/2020      Chemistry      Component Value Date/Time   NA 135 01/25/2020 0945   NA 139 02/27/2018 1457   K 4.6 01/25/2020 0945   CL 97 (L) 01/25/2020 0945   CO2 29 01/25/2020 0945   BUN 13 01/25/2020 0945   BUN 7 02/27/2018 1457   CREATININE 1.14 01/25/2020 0945   CREATININE 1.13 06/28/2016 1453      Component Value Date/Time   CALCIUM 8.8 (L) 01/25/2020 0945   ALKPHOS 117 01/25/2020 0945   AST 22 01/25/2020 0945   ALT 25 01/25/2020 0945   BILITOT 0.4 01/25/2020 0945  RADIOGRAPHIC STUDIES: CT Chest W Contrast  Result Date: 01/25/2020 CLINICAL DATA:  Primary Cancer Type: Lung Imaging Indication: Routine surveillance Interval therapy since last imaging? No Initial Cancer Diagnosis Date: 03/21/2018     Established by: Biopsy-proven Detailed Pathology: Stage IIIA non-small cell lung cancer, adenosquamous carcinoma. Primary Tumor location: Right upper lobe lung mass with questionable chest wall invasion as well as right hilar and mediastinal lymphadenopathy. Radiation Therapy: 04/04/2018-05/18/2018 Chemotherapy: Yes Ongoing? No Most recent administration: 05/15/2018 Immunotherapy? Yes Type: Imfinzi Ongoing? No EXAM: CT CHEST WITH CONTRAST TECHNIQUE: Multidetector CT imaging of the chest was performed during intravenous contrast administration. CONTRAST:  83m OMNIPAQUE IOHEXOL 300 MG/ML  SOLN COMPARISON:  Most recent CT Chest 10/26/2019.  03/10/2018 PET-CT. FINDINGS: Cardiovascular: Normal heart size. No significant pericardial effusion/thickening. Three-vessel coronary atherosclerosis. Single lead left subclavian ICD with lead tips in the right ventricular apex. Right internal jugular Port-A-Cath  terminates at the cavoatrial junction. Great vessels are normal in course and caliber. No central pulmonary emboli. Mediastinum/Nodes: Thyroid appears atrophic. Unremarkable esophagus. No pathologically enlarged axillary, mediastinal or hilar lymph nodes. Lungs/Pleura: No pneumothorax. No pleural effusion. Mild centrilobular and paraseptal emphysema with diffuse bronchial wall thickening. Masslike consolidation at the right lung apex has increased to 8.2 x 5.5 cm (series 5/image 36), previously 5.6 x 4.7 cm on 10/26/2019 chest CT. Two scattered tiny solid left pulmonary nodules, largest 4 mm in the anterior left upper lobe (series 5/image 83), stable. Tiny 5 mm peripheral left upper lobe ground-glass pulmonary nodule (series 5/image 53). No new significant pulmonary nodules. Upper abdomen: Cholecystectomy. Musculoskeletal: No aggressive appearing focal osseous lesions. Symmetric mild bilateral gynecomastia. Mild thoracic spondylosis. IMPRESSION: 1. Masslike consolidation at the right lung apex has increased, cannot exclude local tumor recurrence. Recommend further evaluation with PET-CT. 2. Otherwise no evidence of metastatic disease in the chest. 3. Three-vessel coronary atherosclerosis. 4. Aortic Atherosclerosis (ICD10-I70.0) and Emphysema (ICD10-J43.9). Electronically Signed   By: JIlona SorrelM.D.   On: 01/25/2020 11:51    ASSESSMENT AND PLAN: This is a very pleasant 59years old white male recently diagnosed with a stage IIIa non-small cell lung cancer, adenosquamous carcinoma.  He underwent a course of concurrent chemoradiation with weekly carboplatin and paclitaxel status post 6 cycles with partial response. The patient completed treatment with consolidation immunotherapy with Imfinzi status post 26 cycles.  He tolerated this treatment well with no concerning adverse effects. The patient is currently on observation and he is feeling fine except for the mild fatigue and shortness of breath with  exertion. He had repeat CT scan of the chest performed recently.  I personally and independently reviewed the scan images and discussed the result and showed the images to the patient and his wife. His scan showed increase in the consolidation in the right upper lobe suspicious for disease recurrence versus extensive radiotherapy effect. I recommended for the patient to have a PET scan for further evaluation of this area before consideration of any additional treatment. He will come back for follow-up visit in 2 weeks for evaluation and discussion of the PET scan results and further recommendation regarding his condition. The patient was advised to call immediately if he has any concerning symptoms in the interval. The patient voices understanding of current disease status and treatment options and is in agreement with the current care plan. All questions were answered. The patient knows to call the clinic with any problems, questions or concerns. We can certainly see the patient much sooner if necessary.   Disclaimer:  This note was dictated with voice recognition software. Similar sounding words can inadvertently be transcribed and may not be corrected upon review.

## 2020-02-01 ENCOUNTER — Ambulatory Visit: Payer: Medicare Other

## 2020-02-01 DIAGNOSIS — C3490 Malignant neoplasm of unspecified part of unspecified bronchus or lung: Secondary | ICD-10-CM | POA: Diagnosis not present

## 2020-02-01 DIAGNOSIS — J449 Chronic obstructive pulmonary disease, unspecified: Secondary | ICD-10-CM | POA: Diagnosis not present

## 2020-02-01 DIAGNOSIS — G894 Chronic pain syndrome: Secondary | ICD-10-CM | POA: Diagnosis not present

## 2020-02-05 ENCOUNTER — Ambulatory Visit: Payer: Medicare Other

## 2020-02-08 ENCOUNTER — Other Ambulatory Visit: Payer: Self-pay

## 2020-02-08 ENCOUNTER — Encounter (HOSPITAL_COMMUNITY)
Admission: RE | Admit: 2020-02-08 | Discharge: 2020-02-08 | Disposition: A | Discharge status: Home / Self Care | Payer: Medicare Other | Source: Ambulatory Visit | Attending: Internal Medicine | Admitting: Internal Medicine

## 2020-02-08 DIAGNOSIS — C3490 Malignant neoplasm of unspecified part of unspecified bronchus or lung: Secondary | ICD-10-CM | POA: Diagnosis not present

## 2020-02-08 DIAGNOSIS — R918 Other nonspecific abnormal finding of lung field: Secondary | ICD-10-CM | POA: Diagnosis not present

## 2020-02-08 LAB — GLUCOSE, CAPILLARY: Glucose-Capillary: 95 mg/dL (ref 70–99)

## 2020-02-08 MED ORDER — FLUDEOXYGLUCOSE F - 18 (FDG) INJECTION
11.3300 | Freq: Once | INTRAVENOUS | Status: AC
  Administered 2020-02-08: 11.33 via INTRAVENOUS

## 2020-02-13 ENCOUNTER — Encounter: Payer: Self-pay | Admitting: Internal Medicine

## 2020-02-13 ENCOUNTER — Other Ambulatory Visit: Payer: Self-pay

## 2020-02-13 ENCOUNTER — Inpatient Hospital Stay (HOSPITAL_BASED_OUTPATIENT_CLINIC_OR_DEPARTMENT_OTHER): Payer: Medicare Other | Admitting: Internal Medicine

## 2020-02-13 VITALS — BP 105/74 | HR 80 | Temp 97.9°F | Resp 17 | Ht 73.0 in | Wt 229.0 lb

## 2020-02-13 DIAGNOSIS — C3491 Malignant neoplasm of unspecified part of right bronchus or lung: Secondary | ICD-10-CM

## 2020-02-13 DIAGNOSIS — J449 Chronic obstructive pulmonary disease, unspecified: Secondary | ICD-10-CM | POA: Diagnosis not present

## 2020-02-13 DIAGNOSIS — Z5112 Encounter for antineoplastic immunotherapy: Secondary | ICD-10-CM | POA: Diagnosis not present

## 2020-02-13 DIAGNOSIS — Z7982 Long term (current) use of aspirin: Secondary | ICD-10-CM | POA: Diagnosis not present

## 2020-02-13 DIAGNOSIS — Z79899 Other long term (current) drug therapy: Secondary | ICD-10-CM | POA: Diagnosis not present

## 2020-02-13 DIAGNOSIS — C3411 Malignant neoplasm of upper lobe, right bronchus or lung: Secondary | ICD-10-CM | POA: Diagnosis not present

## 2020-02-13 DIAGNOSIS — E7800 Pure hypercholesterolemia, unspecified: Secondary | ICD-10-CM | POA: Diagnosis not present

## 2020-02-13 DIAGNOSIS — Z923 Personal history of irradiation: Secondary | ICD-10-CM | POA: Diagnosis not present

## 2020-02-13 DIAGNOSIS — Z86718 Personal history of other venous thrombosis and embolism: Secondary | ICD-10-CM | POA: Diagnosis not present

## 2020-02-13 DIAGNOSIS — Z9221 Personal history of antineoplastic chemotherapy: Secondary | ICD-10-CM | POA: Diagnosis not present

## 2020-02-13 DIAGNOSIS — I252 Old myocardial infarction: Secondary | ICD-10-CM | POA: Diagnosis not present

## 2020-02-13 MED ORDER — CYANOCOBALAMIN 1000 MCG/ML IJ SOLN
INTRAMUSCULAR | Status: AC
  Filled 2020-02-13: qty 1

## 2020-02-13 MED ORDER — CYANOCOBALAMIN 1000 MCG/ML IJ SOLN: 1000.0000 ug | Freq: Once | INTRAMUSCULAR | Status: DC

## 2020-02-13 NOTE — Patient Instructions (Signed)
Cyanocobalamin, Pyridoxine, and Folate What is this medicine? A multivitamin containing folic acid, vitamin B6, and vitamin B12. This medicine may be used for other purposes; ask your health care provider or pharmacist if you have questions. COMMON BRAND NAME(S): AllanFol RX, AllanTex, Av-Vite FB, B Complex with Folic Acid, ComBgen, FaBB, Folamin, Folastin, Folbalin, Folbee, Folbic, Folcaps, Folgard, Folgard RX, Folgard RX 2.2, Folplex, Folplex 2.2, Foltabs 800, Foltx, Homocysteine Formula, Niva-Fol, NuFol, TL Gard RX, Virt-Gard, Virt-Vite, Virt-Vite Forte, Vita-Respa What should I tell my health care provider before I take this medicine? They need to know if you have any of these conditions:  bleeding or clotting disorder  history of anemia of any type  other chronic health condition  an unusual or allergic reaction to vitamins, other medicines, foods, dyes, or preservatives  pregnant or trying to get pregnant  breast-feeding How should I use this medicine? Take by mouth with a glass of water. May take with food. Follow the directions on the prescription label. It is usually given once a day. Do not take your medicine more often than directed. Contact your pediatrician regarding the use of this medicine in children. Special care may be needed. Overdosage: If you think you have taken too much of this medicine contact a poison control center or emergency room at once. NOTE: This medicine is only for you. Do not share this medicine with others. What if I miss a dose? If you miss a dose, take it as soon as you can. If it is almost time for your next dose, take only that dose. Do not take double or extra doses. What may interact with this medicine?  levodopa This list may not describe all possible interactions. Give your health care provider a list of all the medicines, herbs, non-prescription drugs, or dietary supplements you use. Also tell them if you smoke, drink alcohol, or use illegal  drugs. Some items may interact with your medicine. What should I watch for while using this medicine? See your health care professional for regular checks on your progress. Remember that vitamin supplements do not replace the need for good nutrition from a balanced diet. What side effects may I notice from receiving this medicine? Side effects that you should report to your doctor or health care professional as soon as possible:  allergic reaction such as skin rash or difficulty breathing  vomiting Side effects that usually do not require medical attention (report to your doctor or health care professional if they continue or are bothersome):  nausea  stomach upset This list may not describe all possible side effects. Call your doctor for medical advice about side effects. You may report side effects to FDA at 1-800-FDA-1088. Where should I keep my medicine? Keep out of the reach of children. Most vitamins should be stored at controlled room temperature. Check your specific product directions. Protect from heat and moisture. Throw away any unused medicine after the expiration date. NOTE: This sheet is a summary. It may not cover all possible information. If you have questions about this medicine, talk to your doctor, pharmacist, or health care provider.  2020 Elsevier/Gold Standard (2007-11-04 00:59:55)  

## 2020-02-13 NOTE — Progress Notes (Signed)
DISCONTINUE ON PATHWAY REGIMEN - Non-Small Cell Lung     A cycle is every 14 days:     Durvalumab   **Always confirm dose/schedule in your pharmacy ordering system**  REASON: Disease Progression PRIOR TREATMENT: [id number]: Durvalumab 10 mg/kg q14 Days x up to 12 Months TREATMENT RESPONSE: Progressive Disease (PD)  START ON PATHWAY REGIMEN - Non-Small Cell Lung     A cycle is 21 days:     Pembrolizumab   **Always confirm dose/schedule in your pharmacy ordering system**  Patient Characteristics: Stage IV Metastatic, Nonsquamous, Initial Chemotherapy/Immunotherapy, PS = 0, 1, ALK Rearrangement Negative and ROS1 Rearrangement Negative and NTRK Gene Fusion?Negative and RET Gene Fusion?Negative and EGFR Mutation Negative/Non?Sensitizing, PD-L1  Expression Positive  ? 50% (TPS) and Immunotherapy Candidate Therapeutic Status: Stage IV Metastatic Histology: Nonsquamous Cell ROS1 Rearrangement Status: Negative Other Mutations/Biomarkers: No Other Actionable Mutations NTRK Gene Fusion Status: Negative PD-L1 Expression Status: PD-L1 Positive ? 50% (TPS) Chemotherapy/Immunotherapy LOT: Initial Chemotherapy/Immunotherapy Molecular Targeted Therapy: Not Appropriate MET Exon 14 Mutation Status: Negative RET Gene Fusion Status: Negative ALK Rearrangement Status: Negative EGFR Mutation Status: Negative/Wild Type BRAF V600E Mutation Status: Negative ECOG Performance Status: 1 Biomarker Assessment Status Confirmation: All Genomic Markers Negative or Only MET+ or BRAF+ Immunotherapy Candidate Status: Candidate for Immunotherapy Intent of Therapy: Non-Curative / Palliative Intent, Discussed with Patient

## 2020-02-13 NOTE — Progress Notes (Signed)
[facility] Telephone:([phone]) [phone]   Fax:(336) [phone]  OFFICE PROGRESS NOTE  [employer] [employer], MD [place] [address]  DIAGNOSIS:stage IIIA (T3, N2, M0) non-small cell lung cancer, adenosquamous carcinoma diagnosed in June 2019 and presented with large right upper lobe lung mass with questionable chest wall invasion as well as right hilar and mediastinal lymphadenopathy.  Biomarker Findings Tumor Mutational Burden - TMB-High (21 Muts/Mb) Microsatellite status - MS-Stable Genomic Findings For a complete list of the genes assayed, please refer to the Appendix. ATM C9470* CCND2 P281R KRAS G12V CHEK2 T345f*15 TP53 E298* 7 Disease relevant genes with no reportable alterations: ALK, EGFR, BRAF, MET, RET, ERBB2, ROS1  PDL1 Expression: 99%.  PRIOR THERAPY: 1) Concurrent chemoradiation with chemotherapy consisting of weekly carboplatin for an AUC of 2 and paclitaxel 45 mg/m2. First dose given on 04/03/2018.  Status post 6 cycles.  Last dose was giving 05/15/2018 with partial response. 2) Consolidation treatment with immunotherapy with Imfinzi (Durvalumab) 10 mg/KG every 2 weeks.  First dose 06/20/2018.  Status post 26 cycles.  CURRENT THERAPY:Systemic treatment with immunotherapy with Keytruda 200 mg IV every 3 weeks.  First dose February 27, 2020.  INTERVAL HISTORY: Mark LEVAN59y.o. male returns to the clinic today for follow-up visit accompanied by his wife.  The patient is feeling fine today with no concerning complaints except for fatigue.  He denied having any current chest pain but has shortness of breath with exertion with no cough or hemoptysis.  He denied having any fever or chills.  He has no nausea, vomiting, diarrhea or constipation.  He has no headache or visual changes.  He was found on recent CT scan of the chest to have questionable disease progression.  The patient underwent a PET scan recently and he is here for evaluation and  discussion of his PET scan results and treatment options.   MEDICAL HISTORY: Past Medical History:  Diagnosis Date  . Acute hepatitis C virus infection 06/19/2007   Qualifier: Diagnosis of  By: MLeilani MerlCMA, Tiffany    . Acute ST elevation myocardial infarction (STEMI) involving left anterior descending (LAD) coronary artery ([facility] 05/26/2016  . Acute ST elevation myocardial infarction (STEMI) of lateral wall ([facility] 05/26/2016  . 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 Anxiety   . Asthma   . Atherosclerosis of native arteries of the extremities with intermittent claudication 12/16/2011  . Chronic hepatitis C without hepatic coma ([facility] 05/03/2014  . COPD with chronic bronchitis and emphysema ([facility] 03/13/2018   FEV1 57%  . Depression   . DVT (deep venous thrombosis) ([facility] 2009; 2019   RLE; LLE  . Encounter for antineoplastic chemotherapy 03/22/2018  . Hepatitis C    "tx'd in 2015"  . High cholesterol   . Ischemic cardiomyopathy 03/02/2018  . Non-small cell carcinoma of right lung, stage 3 ([facility] 03/22/2018  . PAD (peripheral artery disease) ([facility] 01/25/2013  . Peripheral vascular disease, unspecified 07/20/2012  . Port-A-Cath in place 04/24/2018  . ST elevation myocardial infarction involving left anterior descending (LAD) coronary artery ([facility]   . STEMI (ST elevation myocardial infarction) ([facility] 05/26/2016  . Tobacco abuse 01/28/2016  . Tubular adenoma of colon 07/11/2014  . Urinary dribbling     ALLERGIES:  is allergic to lyrica [pregabalin] and neurontin [gabapentin].  MEDICATIONS:  Current Outpatient Medications  Medication Sig Dispense Refill  . alprazolam (XANAX) 2 MG tablet Take 2 mg by mouth 4 (four) times daily as needed for  anxiety.     . amitriptyline (ELAVIL) 100 MG tablet Take 100 mg by mouth at bedtime.     . aspirin 81 MG EC tablet Take 81 mg by mouth daily.      . atorvastatin (LIPITOR) 80 MG tablet Take 1 tablet (80 mg total) by mouth daily at 6 PM. 30  tablet 12  . carvedilol (COREG) 6.25 MG tablet Take 1 tablet (6.25 mg total) by mouth 2 (two) times daily with a meal. OFFICE VISIT NEEDED 60 tablet 8  . citalopram (CELEXA) 40 MG tablet Take 40 mg by mouth daily.  11  . furosemide (LASIX) 20 MG tablet Take 1 tablet (20 mg total) by mouth daily. Please make overdue appt with Dr. Brice Prairie before anymore refills. 2nd attempt 15 tablet 0  . HYDROcodone-acetaminophen (NORCO) 10-325 MG tablet Take 1 tablet by mouth every 4 (four) hours as needed for moderate pain.   0  . levothyroxine (SYNTHROID) 125 MCG tablet TAKE 1 TABLET BY MOUTH EVERY DAY 30 tablet 2  . lidocaine-prilocaine (EMLA) cream Apply 1 application topically as needed. 30 g 0  . lisinopril (PRINIVIL,ZESTRIL) 2.5 MG tablet Take 1 tablet (2.5 mg total) by mouth daily. 30 tablet 12  . nitroGLYCERIN (NITROSTAT) 0.4 MG SL tablet Place 1 tablet (0.4 mg total) under the tongue every 5 (five) minutes x 3 doses as needed for chest pain. 25 tablet 2  . prochlorperazine (COMPAZINE) 10 MG tablet Take 1 tablet (10 mg total) by mouth every 6 (six) hours as needed for nausea or vomiting. 30 tablet 0  . spironolactone (ALDACTONE) 25 MG tablet TAKE 1 TABLET BY MOUTH EVERY DAY 90 tablet 0  . sucralfate (CARAFATE) 1 g tablet Take 1 tablet (1 g total) by mouth 4 (four) times daily -  with meals and at bedtime. 5 min before meals for radiation induced esophagitis 120 tablet 2  . tamsulosin (FLOMAX) 0.4 MG CAPS capsule Take 1 capsule by mouth daily.    . Tiotropium Bromide-Olodaterol (STIOLTO RESPIMAT) 2.5-2.5 MCG/ACT AERS Inhale 2 puffs into the lungs daily. 1 Inhaler 5  . umeclidinium-vilanterol (ANORO ELLIPTA) 62.5-25 MCG/INH AEPB Inhale 1 puff into the lungs daily. 1 each 0  . levETIRAcetam (KEPPRA) 750 MG tablet TAKE 1 TABLET BY MOUTH TWICE A DAY (Patient not taking: Reported on 02/13/2020) 60 tablet 5  . sildenafil (VIAGRA) 25 MG tablet TAKE 1 TABLET BY MOUTH 60UMINUTES PRIOR TOEENCOUNTER.S     No current  facility-administered medications for this visit.    SURGICAL HISTORY:  Past Surgical History:  Procedure Laterality Date  . AORTOGRAM  08/08/2009   for LLE claudication     By Dr. Fields  . 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 BIOPSY N/A 05/30/2014   Procedure: BIOPSY;  Surgeon: Robert M Rourk, MD;  Location: [facility];  Service: Endoscopy;  Laterality: N/A;  . BLADDER TUMOR EXCISION  8/812  . CARDIAC CATHETERIZATION N/A 05/26/2016   Procedure: Left Heart Cath and Coronary Angiography;  Surgeon: Thomas A Kelly, MD;  Location: [facility];  Service: Cardiovascular;  Laterality: N/A;  . CARDIAC CATHETERIZATION N/A 05/26/2016   Procedure: Coronary Stent Intervention;  Surgeon: Thomas A Kelly, MD;  Location: [facility];  Service: Cardiovascular;  Laterality: N/A;  . COLONOSCOPY WITH PROPOFOL N/A 05/30/2014   RMR:Single colonic polyp-likely source of hematochezia-removed as described above  . ESOPHAGOGASTRODUODENOSCOPY (EGD) WITH PROPOFOL N/A 05/30/2014   RMR:Antral and bulbar erosions s/p gastric biopsy. No evidence of portal gastropathy   on today's examination.  . FEMORAL-TIBIAL BYPASS GRAFT  2009   Right side using non-reversed GSV   By Dr. Fields  . FEMORAL-TIBIAL BYPASS GRAFT  11/24/2007   Right femoral to anterior tibial BPG   by Dr. Fields  . FINGER SURGERY Left    "straightened my pinky"  . HAND TENDON SURGERY Left 2013   Left 5th finger  . HERNIA REPAIR     "stomach"  . ICD IMPLANT N/A 03/02/2018   Procedure: ICD IMPLANT;  Surgeon: Camnitz, Will Martin, MD;  [facility]: [facility];  Service: Cardiovascular;  Laterality: N/A;  . INCISIONAL HERNIA REPAIR N/A 12/25/2014   Procedure: HERNIA REPAIR INCISIONAL WITH MESH;  Surgeon: Mark Jenkins Md, MD;  Location: [facility];  Service: General;  Laterality: N/A;  . INSERTION OF MESH N/A 12/25/2014   Procedure: INSERTION OF MESH;  Surgeon: Mark Jenkins Md, MD;  Location: [facility];  Service: General;  Laterality: N/A;  . IR  IMAGING GUIDED PORT INSERTION  04/11/2018  . LAPAROSCOPIC CHOLECYSTECTOMY    . LOWER EXTREMITY ANGIOGRAM Left 12/30/2015   Procedure: Lower Extremity Angiogram;  Surgeon: Charles E Fields, MD;  [facility];  Service: Cardiovascular;  Laterality: Left;  . PERIPHERAL VASCULAR CATHETERIZATION N/A 12/30/2015   Procedure: Abdominal Aortogram;  Surgeon: Charles E Fields, MD;  [facility];  Service: Cardiovascular;  Laterality: N/A;  . POLYPECTOMY  05/30/2014   Procedure: POLYPECTOMY;  Surgeon: Robert M Rourk, MD;  Location: [facility];  Service: Endoscopy;;  . TONSILLECTOMY AND ADENOIDECTOMY  ~ 1970  . TYMPANOSTOMY TUBE PLACEMENT Bilateral ~ 1970    REVIEW OF SYSTEMS:  Constitutional: positive for fatigue Eyes: negative Ears, nose, mouth, throat, and face: negative Respiratory: positive for dyspnea on exertion Cardiovascular: negative Gastrointestinal: negative Genitourinary:negative Integument/breast: negative Hematologic/lymphatic: negative Musculoskeletal:negative Neurological: negative Behavioral/Psych: negative Endocrine: negative Allergic/Immunologic: negative   PHYSICAL EXAMINATION: General appearance: alert, cooperative, fatigued and no distress Head: Normocephalic, without obvious abnormality, atraumatic Neck: no adenopathy, no JVD, supple, symmetrical, trachea midline and thyroid not enlarged, symmetric, no tenderness/mass/nodules Lymph nodes: Cervical, supraclavicular, and axillary nodes normal. Resp: clear to auscultation bilaterally Back: symmetric, no curvature. ROM normal. No CVA tenderness. Cardio: regular rate and rhythm, S1, S2 normal, no murmur, click, rub or gallop GI: soft, non-tender; bowel sounds normal; no masses,  no organomegaly Extremities: Right below knee amputation otherwise no edema. Neurologic: Alert and oriented X 3, normal strength and tone. Normal symmetric reflexes. Normal coordination and gait  ECOG PERFORMANCE STATUS: 1 -  Symptomatic but completely ambulatory  Blood pressure 105/74, pulse 80, temperature 97.9 F (36.6 C), temperature source Temporal, resp. rate 17, height 6' 1" (1.854 m), weight 229 lb (103.9 kg), SpO2 93 %.  LABORATORY DATA: Lab Results  Component Value Date   WBC 9.5 01/25/2020   HGB 14.2 01/25/2020   HCT 43.5 01/25/2020   MCV 95.0 01/25/2020   PLT 251 01/25/2020      Chemistry      Component Value Date/Time   NA 135 01/25/2020 0945   NA 139 02/27/2018 1457   K 4.6 01/25/2020 0945   CL 97 (L) 01/25/2020 0945   CO2 29 01/25/2020 0945   BUN 13 01/25/2020 0945   BUN 7 02/27/2018 1457   CREATININE 1.14 01/25/2020 0945   CREATININE 1.13 06/28/2016 1453      Component Value Date/Time   CALCIUM 8.8 (L) 01/25/2020 0945   ALKPHOS 117 01/25/2020 0945   AST 22 01/25/2020 0945     ALT 25 01/25/2020 0945   BILITOT 0.4 01/25/2020 0945       RADIOGRAPHIC STUDIES: CT Chest W Contrast  Result Date: 01/25/2020 CLINICAL DATA:  Primary Cancer Type: Lung Imaging Indication: Routine surveillance Interval therapy since last imaging? No Initial Cancer Diagnosis Date: 03/21/2018     Established by: Biopsy-proven Detailed Pathology: Stage IIIA non-small cell lung cancer, adenosquamous carcinoma. Primary Tumor location: Right upper lobe lung mass with questionable chest wall invasion as well as right hilar and mediastinal lymphadenopathy. Radiation Therapy: 04/04/2018-05/18/2018 Chemotherapy: Yes Ongoing? No Most recent administration: 05/15/2018 Immunotherapy? Yes Type: Imfinzi Ongoing? No EXAM: CT CHEST WITH CONTRAST TECHNIQUE: Multidetector CT imaging of the chest was performed during intravenous contrast administration. CONTRAST:  75mL OMNIPAQUE IOHEXOL 300 MG/ML  SOLN COMPARISON:  Most recent CT Chest 10/26/2019.  03/10/2018 PET-CT. FINDINGS: Cardiovascular: Normal heart size. No significant pericardial effusion/thickening. Three-vessel coronary atherosclerosis. Single lead left subclavian ICD with  lead tips in the right ventricular apex. Right internal jugular Port-A-Cath terminates at the cavoatrial junction. Great vessels are normal in course and caliber. No central pulmonary emboli. Mediastinum/Nodes: Thyroid appears atrophic. Unremarkable esophagus. No pathologically enlarged axillary, mediastinal or hilar lymph nodes. Lungs/Pleura: No pneumothorax. No pleural effusion. Mild centrilobular and paraseptal emphysema with diffuse bronchial wall thickening. Masslike consolidation at the right lung apex has increased to 8.2 x 5.5 cm (series 5/image 36), previously 5.6 x 4.7 cm on 10/26/2019 chest CT. Two scattered tiny solid left pulmonary nodules, largest 4 mm in the anterior left upper lobe (series 5/image 83), stable. Tiny 5 mm peripheral left upper lobe ground-glass pulmonary nodule (series 5/image 53). No new significant pulmonary nodules. Upper abdomen: Cholecystectomy. Musculoskeletal: No aggressive appearing focal osseous lesions. Symmetric mild bilateral gynecomastia. Mild thoracic spondylosis. IMPRESSION: 1. Masslike consolidation at the right lung apex has increased, cannot exclude local tumor recurrence. Recommend further evaluation with PET-CT. 2. Otherwise no evidence of metastatic disease in the chest. 3. Three-vessel coronary atherosclerosis. 4. Aortic Atherosclerosis (ICD10-I70.0) and Emphysema (ICD10-J43.9). Electronically Signed   By: Jason A Poff M.D.   On: 01/25/2020 11:51   NM PET Image Restag (PS) Skull Base To Thigh  Result Date: 02/10/2020 CLINICAL DATA:  Subsequent treatment strategy for non-small cell lung cancer. Initial diagnosis June 2019. Recent chest CT suggested progressive masslike consolidation in the right lung apex. EXAM: NUCLEAR MEDICINE PET SKULL BASE TO THIGH TECHNIQUE: 11.33 mCi F-18 FDG was injected intravenously. Full-ring PET imaging was performed from the skull base to thigh after the radiotracer. CT data was obtained and used for attenuation correction and  anatomic localization. Fasting blood glucose: 95 mg/dl COMPARISON:  PET-CT 03/10/2018 FINDINGS: Mediastinal blood pool activity: SUV max 3.12 Liver activity: SUV max NA NECK: No hypermetabolic lymph nodes in the neck. Incidental CT findings: none CHEST: Progressive area of masslike consolidation in the right lung apex posteriorly and medially demonstrates hypermetabolism with SUV max of 7.17. Findings consistent with local recurrence. There is also a small but clearly enlarging right upper lobe pulmonary nodule medially on image 25/8. This measures 5.5 mm and is hypermetabolic with SUV max of 8.27. Ground-glass nodule in the left upper lobe on image 15/8 measures 5 mm and appears stable. No hypermetabolism is demonstrated. No other pulmonary nodules are identified. Stable calcified granuloma in the left lower lobe. No pleural effusions. No enlarged or hypermetabolic mediastinal or hilar lymph nodes. Incidental CT findings: none ABDOMEN/PELVIS: No abnormal hypermetabolic activity within the liver, pancreas, adrenal glands, or spleen. No hypermetabolic lymph nodes in the abdomen or   pelvis. Incidental CT findings: Status post cholecystectomy. No biliary dilatation. Stable age advanced atherosclerotic calcifications involving the aorta and iliac arteries but no aneurysm. Stable left iliac artery stent. SKELETON: No findings suspicious for osseous metastatic disease. Incidental CT findings: none IMPRESSION: 1. Hypermetabolism in the progressive masslike area of right apical consolidation highly suspicious for local recurrence. 2. Hypermetabolic 5.5 mm right upper lobe pulmonary nodule could be a metastatic focus or second primary. 3. Stable ground-glass nodule in the left upper lobe. Recommend continued surveillance. 4. No enlarged or hypermetabolic mediastinal or hilar lymph nodes. 5. No findings for abdominal/pelvic metastatic disease or osseous metastatic disease. Electronically Signed   By: Marijo Sanes M.D.   On:  02/10/2020 10:23    ASSESSMENT AND PLAN: This is a very pleasant 59 years old white male recently diagnosed with a stage IIIa non-small cell lung cancer, adenosquamous carcinoma.  He underwent a course of concurrent chemoradiation with weekly carboplatin and paclitaxel status post 6 cycles with partial response.  He has no actionable mutation but PD-L1 expression is 99%. The patient completed treatment with consolidation immunotherapy with Imfinzi status post 26 cycles.  He tolerated this treatment well with no concerning adverse effects. The patient is currently on observation and he is feeling fine except for the mild fatigue and shortness of breath with exertion. His recent CT scan showed increase in the consolidation in the right upper lobe suspicious for disease recurrence versus extensive radiotherapy effect. This was followed by a PET scan which was performed recently and showed hypermetabolism in the progressive masslike area of the right apical consolidation highly suspicious for local recurrence.  There was also hypermetabolic 9.89 cm right upper lobe pulmonary nodule suspicious for a metastatic focus or second primary. I personally and independently reviewed the scan images and discussed the results with the patient and his wife. These findings are highly suspicious for disease progression.  I gave the patient the option of continuous observation and monitoring versus treatment with single agent immunotherapy with Keytruda since his PD-L1 expression is 99%. The patient is interested in proceeding with treatment again.  He is expected to start the first cycle of this treatment on February 27, 2020. He will come back for follow-up visit at that time. I discussed with him the adverse effect of the immunotherapy including but not limited to immunotherapy mediated skin rash, diarrhea, inflammation of the lung, kidney, liver, thyroid or other endocrine dysfunction. The patient was advised to call  immediately if he has any other concerning symptoms in the interval. The patient voices understanding of current disease status and treatment options and is in agreement with the current care plan. All questions were answered. The patient knows to call the clinic with any problems, questions or concerns. We can certainly see the patient much sooner if necessary.   Disclaimer: This note was dictated with voice recognition software. Similar sounding words can inadvertently be transcribed and may not be corrected upon review.

## 2020-02-15 ENCOUNTER — Telehealth: Payer: Self-pay | Admitting: Internal Medicine

## 2020-02-15 NOTE — Telephone Encounter (Signed)
ERROR -  [name] PT AT [place]  Pt to let them know appt has not been scheduled yet for 6/2 but it is being worked on - left message

## 2020-02-18 ENCOUNTER — Telehealth: Payer: Self-pay | Admitting: Internal Medicine

## 2020-02-18 NOTE — Telephone Encounter (Signed)
LVM that I was returning her call.

## 2020-02-19 ENCOUNTER — Telehealth: Payer: Self-pay | Admitting: Internal Medicine

## 2020-02-19 NOTE — Telephone Encounter (Signed)
Scheduled appt per 5/21 los - called pt - no answer. Left message with appt date and time

## 2020-02-20 NOTE — Progress Notes (Signed)
Pharmacist Chemotherapy Monitoring - Follow Up Assessment    I verify that I have reviewed each item in the below checklist:  . Regimen for the patient is scheduled for the appropriate day and plan matches scheduled date. [name] Appropriate non-routine labs are ordered dependent on drug ordered. . If applicable, additional medications reviewed and ordered per protocol based on lifetime cumulative doses and/or treatment regimen.   Plan for follow-up and/or issues identified: No . I-vent associated with next due treatment: No . MD and/or nursing notified: No  Philomena Course 02/20/2020 11:32 AM;

## 2020-02-25 DIAGNOSIS — J449 Chronic obstructive pulmonary disease, unspecified: Secondary | ICD-10-CM | POA: Diagnosis not present

## 2020-02-25 DIAGNOSIS — E063 Autoimmune thyroiditis: Secondary | ICD-10-CM | POA: Diagnosis not present

## 2020-02-25 DIAGNOSIS — Z720 Tobacco use: Secondary | ICD-10-CM | POA: Diagnosis not present

## 2020-02-25 DIAGNOSIS — C3491 Malignant neoplasm of unspecified part of right bronchus or lung: Secondary | ICD-10-CM | POA: Diagnosis not present

## 2020-02-27 ENCOUNTER — Other Ambulatory Visit: Payer: Medicare Other

## 2020-02-27 ENCOUNTER — Inpatient Hospital Stay: Payer: Medicare Other | Attending: Internal Medicine

## 2020-02-27 ENCOUNTER — Other Ambulatory Visit: Payer: Self-pay

## 2020-02-27 ENCOUNTER — Ambulatory Visit: Payer: Medicare Other | Admitting: Internal Medicine

## 2020-02-27 ENCOUNTER — Inpatient Hospital Stay: Payer: Medicare Other

## 2020-02-27 VITALS — BP 106/73 | HR 67 | Temp 98.0°F | Resp 16

## 2020-02-27 DIAGNOSIS — Z79899 Other long term (current) drug therapy: Secondary | ICD-10-CM | POA: Diagnosis not present

## 2020-02-27 DIAGNOSIS — F419 Anxiety disorder, unspecified: Secondary | ICD-10-CM | POA: Insufficient documentation

## 2020-02-27 DIAGNOSIS — Z7982 Long term (current) use of aspirin: Secondary | ICD-10-CM | POA: Insufficient documentation

## 2020-02-27 DIAGNOSIS — I252 Old myocardial infarction: Secondary | ICD-10-CM | POA: Insufficient documentation

## 2020-02-27 DIAGNOSIS — B182 Chronic viral hepatitis C: Secondary | ICD-10-CM | POA: Insufficient documentation

## 2020-02-27 DIAGNOSIS — C3491 Malignant neoplasm of unspecified part of right bronchus or lung: Secondary | ICD-10-CM

## 2020-02-27 DIAGNOSIS — J449 Chronic obstructive pulmonary disease, unspecified: Secondary | ICD-10-CM | POA: Insufficient documentation

## 2020-02-27 DIAGNOSIS — F17210 Nicotine dependence, cigarettes, uncomplicated: Secondary | ICD-10-CM | POA: Insufficient documentation

## 2020-02-27 DIAGNOSIS — C3411 Malignant neoplasm of upper lobe, right bronchus or lung: Secondary | ICD-10-CM | POA: Insufficient documentation

## 2020-02-27 DIAGNOSIS — Z95810 Presence of automatic (implantable) cardiac defibrillator: Secondary | ICD-10-CM | POA: Insufficient documentation

## 2020-02-27 DIAGNOSIS — E7800 Pure hypercholesterolemia, unspecified: Secondary | ICD-10-CM | POA: Insufficient documentation

## 2020-02-27 DIAGNOSIS — Z5112 Encounter for antineoplastic immunotherapy: Secondary | ICD-10-CM | POA: Diagnosis not present

## 2020-02-27 DIAGNOSIS — F329 Major depressive disorder, single episode, unspecified: Secondary | ICD-10-CM | POA: Insufficient documentation

## 2020-02-27 DIAGNOSIS — Z86718 Personal history of other venous thrombosis and embolism: Secondary | ICD-10-CM | POA: Insufficient documentation

## 2020-02-27 LAB — CBC WITH DIFFERENTIAL (CANCER CENTER ONLY)
Abs Immature Granulocytes: 0.09 10*3/uL — ABNORMAL HIGH (ref 0.00–0.07)
Basophils Absolute: 0.1 10*3/uL (ref 0.0–0.1)
Basophils Relative: 1 %
Eosinophils Absolute: 0.4 10*3/uL (ref 0.0–0.5)
Eosinophils Relative: 5 %
HCT: 42.9 % (ref 39.0–52.0)
Hemoglobin: 13.9 g/dL (ref 13.0–17.0)
Immature Granulocytes: 1 %
Lymphocytes Relative: 11 %
Lymphs Abs: 0.9 10*3/uL (ref 0.7–4.0)
MCH: 30.9 pg (ref 26.0–34.0)
MCHC: 32.4 g/dL (ref 30.0–36.0)
MCV: 95.3 fL (ref 80.0–100.0)
Monocytes Absolute: 0.8 10*3/uL (ref 0.1–1.0)
Monocytes Relative: 10 %
Neutro Abs: 6.1 10*3/uL (ref 1.7–7.7)
Neutrophils Relative %: 72 %
Platelet Count: 228 10*3/uL (ref 150–400)
RBC: 4.5 MIL/uL (ref 4.22–5.81)
RDW: 14.5 % (ref 11.5–15.5)
WBC Count: 8.3 10*3/uL (ref 4.0–10.5)
nRBC: 0 % (ref 0.0–0.2)

## 2020-02-27 LAB — CMP (CANCER CENTER ONLY)
ALT: 28 U/L (ref 0–44)
AST: 24 U/L (ref 15–41)
Albumin: 3.8 g/dL (ref 3.5–5.0)
Alkaline Phosphatase: 128 U/L — ABNORMAL HIGH (ref 38–126)
Anion gap: 10 (ref 5–15)
BUN: 8 mg/dL (ref 6–20)
CO2: 29 mmol/L (ref 22–32)
Calcium: 8.9 mg/dL (ref 8.9–10.3)
Chloride: 98 mmol/L (ref 98–111)
Creatinine: 1.12 mg/dL (ref 0.61–1.24)
GFR, Est AFR Am: >60 mL/min (ref >60)
GFR, Estimated: >60 mL/min (ref >60)
Glucose, Bld: 83 mg/dL (ref 70–99)
Potassium: 4.1 mmol/L (ref 3.5–5.1)
Sodium: 137 mmol/L (ref 135–145)
Total Bilirubin: 0.4 mg/dL (ref 0.3–1.2)
Total Protein: 6.9 g/dL (ref 6.5–8.1)

## 2020-02-27 LAB — TSH: TSH: 39.812 u[IU]/mL — ABNORMAL HIGH (ref 0.320–4.118)

## 2020-02-27 MED ORDER — SODIUM CHLORIDE 0.9 % IV SOLN
200.0000 mg | Freq: Once | INTRAVENOUS | Status: AC
  Administered 2020-02-27: 200 mg via INTRAVENOUS
  Filled 2020-02-27: qty 8

## 2020-02-27 MED ORDER — SODIUM CHLORIDE 0.9 % IV SOLN
Freq: Once | INTRAVENOUS | Status: AC
  Filled 2020-02-27: qty 250

## 2020-02-27 MED ORDER — HEPARIN SOD (PORK) LOCK FLUSH 100 UNIT/ML IV SOLN
500.0000 [IU] | Freq: Once | INTRAVENOUS | Status: AC | PRN
  Administered 2020-02-27: 500 [IU]
  Filled 2020-02-27: qty 5

## 2020-02-27 MED ORDER — SODIUM CHLORIDE 0.9% FLUSH
10.0000 mL | INTRAVENOUS | Status: DC | PRN
  Administered 2020-02-27: 10 mL
  Filled 2020-02-27: qty 10

## 2020-02-27 NOTE — Patient Instructions (Signed)
[facility] Discharge Instructions for Patients Receiving Chemotherapy  Today you received the following chemotherapy agents: pembrolizumab [name]).  To help prevent nausea and vomiting after your treatment, we encourage you to take your nausea medication as directed.   If you develop nausea and vomiting that is not controlled by your nausea medication, call the clinic.   BELOW ARE SYMPTOMS THAT SHOULD BE REPORTED IMMEDIATELY:  *FEVER GREATER THAN 100.5 F  *CHILLS WITH OR WITHOUT FEVER  NAUSEA AND VOMITING THAT IS NOT CONTROLLED WITH YOUR NAUSEA MEDICATION  *UNUSUAL SHORTNESS OF BREATH  *UNUSUAL BRUISING OR BLEEDING  TENDERNESS IN MOUTH AND THROAT WITH OR WITHOUT PRESENCE OF ULCERS  *URINARY PROBLEMS  *BOWEL PROBLEMS  UNUSUAL RASH Items with * indicate a potential emergency and should be followed up as soon as possible.  Feel free to call the clinic should you have any questions or concerns. The clinic phone number is [phone].  Please show the [facility] at check-in to the Emergency Department and triage nurse.  Pembrolizumab injection What is this medicine? PEMBROLIZUMAB (pem broe liz ue mab) is a monoclonal antibody. It is used to treat certain types of cancer. This medicine may be used for other purposes; ask your health care provider or pharmacist if you have questions. COMMON BRAND NAME(S): Keytruda What should I tell my health care provider before I take this medicine? They need to know if you have any of these conditions:  diabetes  immune system problems  inflammatory bowel disease  liver disease  lung or breathing disease  lupus  received or scheduled to receive an organ transplant or a stem-cell transplant that uses donor stem cells  an unusual or allergic reaction to pembrolizumab, other medicines, foods, dyes, or preservatives  pregnant or trying to get pregnant  breast-feeding How should I use this medicine? This  medicine is for infusion into a vein. It is given by a health care professional in a hospital or clinic setting. A special MedGuide will be given to you before each treatment. Be sure to read this information carefully each time. Talk to your pediatrician regarding the use of this medicine in children. While this drug may be prescribed for children as young as 6 months for selected conditions, precautions do apply. Overdosage: If you think you have taken too much of this medicine contact a poison control center or emergency room at once. NOTE: This medicine is only for you. Do not share this medicine with others. What if I miss a dose? It is important not to miss your dose. Call your doctor or health care professional if you are unable to keep an appointment. What may interact with this medicine? Interactions have not been studied. Give your health care provider a list of all the medicines, herbs, non-prescription drugs, or dietary supplements you use. Also tell them if you smoke, drink alcohol, or use illegal drugs. Some items may interact with your medicine. This list may not describe all possible interactions. Give your health care provider a list of all the medicines, herbs, non-prescription drugs, or dietary supplements you use. Also tell them if you smoke, drink alcohol, or use illegal drugs. Some items may interact with your medicine. What should I watch for while using this medicine? Your condition will be monitored carefully while you are receiving this medicine. You may need blood work done while you are taking this medicine. Do not become pregnant while taking this medicine or for 4 months after stopping it. Women  should inform their doctor if they wish to become pregnant or think they might be pregnant. There is a potential for serious side effects to an unborn child. Talk to your health care professional or pharmacist for more information. Do not breast-feed an infant while taking this  medicine or for 4 months after the last dose. What side effects may I notice from receiving this medicine? Side effects that you should report to your doctor or health care professional as soon as possible:  allergic reactions like skin rash, itching or hives, swelling of the face, lips, or tongue  bloody or black, tarry  breathing problems  changes in vision  chest pain  chills  confusion  constipation  cough  diarrhea  dizziness or feeling faint or lightheaded  fast or irregular heartbeat  fever  flushing  joint pain  low blood counts - this medicine may decrease the number of white blood cells, red blood cells and platelets. You may be at increased risk for infections and bleeding.  muscle pain  muscle weakness  pain, tingling, numbness in the hands or feet  persistent headache  redness, blistering, peeling or loosening of the skin, including inside the mouth  signs and symptoms of high blood sugar such as dizziness; dry mouth; dry skin; fruity breath; nausea; stomach pain; increased hunger or thirst; increased urination  signs and symptoms of kidney injury like trouble passing urine or change in the amount of urine  signs and symptoms of liver injury like dark urine, light-colored stools, loss of appetite, nausea, right upper belly pain, yellowing of the eyes or skin  sweating  swollen lymph nodes  weight loss Side effects that usually do not require medical attention (report to your doctor or health care professional if they continue or are bothersome):  decreased appetite  hair loss  muscle pain  tiredness This list may not describe all possible side effects. Call your doctor for medical advice about side effects. You may report side effects to FDA at 1-800-FDA-1088. Where should I keep my medicine? This drug is given in a hospital or clinic and will not be stored at home. NOTE: This sheet is a summary. It may not cover all possible  information. If you have questions about this medicine, talk to your doctor, pharmacist, or health care provider.  2020 Elsevier/Gold Standard (2019-07-20 18:07:58)

## 2020-02-28 ENCOUNTER — Telehealth: Payer: Self-pay | Admitting: *Deleted

## 2020-03-03 DIAGNOSIS — G894 Chronic pain syndrome: Secondary | ICD-10-CM | POA: Diagnosis not present

## 2020-03-05 ENCOUNTER — Ambulatory Visit (INDEPENDENT_AMBULATORY_CARE_PROVIDER_SITE_OTHER): Payer: Medicare Other | Admitting: *Deleted

## 2020-03-05 DIAGNOSIS — I255 Ischemic cardiomyopathy: Secondary | ICD-10-CM | POA: Diagnosis not present

## 2020-03-05 LAB — CUP PACEART REMOTE DEVICE CHECK
Battery Remaining Longevity: 124 mo
Battery Voltage: 3.01 V
Brady Statistic RV Percent Paced: 0.01 %
Date Time Interrogation Session: 20210609022723
HighPow Impedance: 77 Ohm
Implantable Lead Implant Date: 20190606
Implantable Lead Location: 753860
Implantable Pulse Generator Implant Date: 20190606
Lead Channel Impedance Value: 342 Ohm
Lead Channel Impedance Value: 418 Ohm
Lead Channel Pacing Threshold Amplitude: 0.75 V
Lead Channel Pacing Threshold Pulse Width: 0.4 ms
Lead Channel Sensing Intrinsic Amplitude: 20.875 mV
Lead Channel Sensing Intrinsic Amplitude: 20.875 mV
Lead Channel Setting Pacing Amplitude: 2.5 V
Lead Channel Setting Pacing Pulse Width: 0.4 ms
Lead Channel Setting Sensing Sensitivity: 0.3 mV

## 2020-03-06 NOTE — Progress Notes (Signed)
Remote ICD transmission.   

## 2020-03-17 NOTE — Progress Notes (Signed)
[facility] OFFICE PROGRESS NOTE  Redmond School, MD [address] [place] [address]  DIAGNOSIS: Stage IIIA (T3, N2, M0) non-small cell lung cancer, adenosquamous carcinoma diagnosed in June 2019 and presented with large right upper lobe lung mass with questionable chest wall invasion as well as right hilar and mediastinal lymphadenopathy.  Biomarker Findings Tumor Mutational Burden - TMB-High (21 Muts/Mb) Microsatellite status - MS-Stable Genomic Findings For a complete list of the genes assayed, please refer to the Appendix. ATM F7510* CCND2 P281R KRAS G12V CHEK2 T321f*15 TP53 E298* 7 Disease relevant genes with no reportable alterations: ALK, EGFR, BRAF, MET, RET, ERBB2, ROS1  PDL1 Expression: 99%.  PRIOR THERAPY: 1) Concurrent chemoradiation with chemotherapy consisting of weekly carboplatin for an AUC of 2 and paclitaxel 45 mg/m2. First dose given on 04/03/2018.Status post 6 cycles.  Last dose was giving 05/15/2018 with partial response. 2) Consolidation treatment with immunotherapy with Imfinzi (Durvalumab) 10 mg/KG every 2 weeks.  First dose 06/20/2018.  Status post 26 cycles.  CURRENT THERAPY: Systemic treatment with immunotherapy with Keytruda 200 mg IV every 3 weeks.  First dose February 27, 2020. Status post 1 cycle.   INTERVAL HISTORY: SEMERIL STILLE59y.o. male returns to the clinic for a follow up visit. The patient is feeling well today without any concerning complaints. The patient tolerated his first cycle of immunotherapy well without any adverse side effects. Denies any fever, chills, night sweats, or weight loss. Denies any chest pain or hemoptysis.  He reports his baseline shortness of breath and cough but denies any chest pain or hemoptysis.  Patient continues to smoke cigarettes which contributes to his respiratory complaints. He smokes a little over a pack of cigarettes per day. Denies any nausea, vomiting, or diarrhea. Denies any headache or  visual changes. Denies any rashes or skin changes. He reports his baseline constipation for which he takes over-the-counter stool softener daily which reportedly works well for him. The patient is here today for evaluation prior to starting cycle #2     MEDICAL HISTORY: Past Medical History:  Diagnosis Date  . Acute hepatitis C virus infection 06/19/2007   Qualifier: Diagnosis of  By: MLeilani MerlCMA, Tiffany    . Acute ST elevation myocardial infarction (STEMI) involving left anterior descending (LAD) coronary artery ([facility] 05/26/2016  . Acute ST elevation myocardial infarction (STEMI) of lateral wall ([facility] 05/26/2016  . 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 Anxiety   . Asthma   . Atherosclerosis of native arteries of the extremities with intermittent claudication 12/16/2011  . Chronic hepatitis C without hepatic coma ([facility] 05/03/2014  . COPD with chronic bronchitis and emphysema ([facility] 03/13/2018   FEV1 57%  . Depression   . DVT (deep venous thrombosis) ([facility] 2009; 2019   RLE; LLE  . Encounter for antineoplastic chemotherapy 03/22/2018  . Hepatitis C    "tx'd in 2015"  . High cholesterol   . Ischemic cardiomyopathy 03/02/2018  . Non-small cell carcinoma of right lung, stage 3 ([facility] 03/22/2018  . PAD (peripheral artery disease) ([facility] 01/25/2013  . Peripheral vascular disease, unspecified 07/20/2012  . Port-A-Cath in place 04/24/2018  . ST elevation myocardial infarction involving left anterior descending (LAD) coronary artery ([facility]   . STEMI (ST elevation myocardial infarction) ([facility] 05/26/2016  . Tobacco abuse 01/28/2016  . Tubular adenoma of colon 07/11/2014  . Urinary dribbling     ALLERGIES:  is allergic to lyrica [pregabalin] and neurontin [gabapentin].  MEDICATIONS:  Current Outpatient Medications  Medication  Sig Dispense Refill  . alprazolam (XANAX) 2 MG tablet Take 2 mg by mouth 4 (four) times daily as needed for anxiety.     [name] amitriptyline (ELAVIL) 100 MG  tablet Take 100 mg by mouth at bedtime.     [name] aspirin 81 MG EC tablet Take 81 mg by mouth daily.      [name] atorvastatin (LIPITOR) 80 MG tablet Take 1 tablet (80 mg total) by mouth daily at 6 PM. 30 tablet 12  . carvedilol (COREG) 6.25 MG tablet Take 1 tablet (6.25 mg total) by mouth 2 (two) times daily with a meal. OFFICE VISIT NEEDED 60 tablet 8  . citalopram (CELEXA) 40 MG tablet Take 40 mg by mouth daily.  11  . furosemide (LASIX) 20 MG tablet Take 1 tablet (20 mg total) by mouth daily. Please make overdue appt with Dr. Oval Linsey before anymore refills. 2nd attempt 15 tablet 0  . HYDROcodone-acetaminophen (NORCO) 10-325 MG tablet Take 1 tablet by mouth every 4 (four) hours as needed for moderate pain.   0  . levETIRAcetam (KEPPRA) 750 MG tablet TAKE 1 TABLET BY MOUTH TWICE A DAY (Patient not taking: Reported on 02/13/2020) 60 tablet 5  . levothyroxine (SYNTHROID) 125 MCG tablet TAKE 1 TABLET BY MOUTH EVERY DAY 30 tablet 2  . lidocaine-prilocaine (EMLA) cream Apply 1 application topically as needed. 30 g 0  . lisinopril (PRINIVIL,ZESTRIL) 2.5 MG tablet Take 1 tablet (2.5 mg total) by mouth daily. 30 tablet 12  . nitroGLYCERIN (NITROSTAT) 0.4 MG SL tablet Place 1 tablet (0.4 mg total) under the tongue every 5 (five) minutes x 3 doses as needed for chest pain. 25 tablet 2  . prochlorperazine (COMPAZINE) 10 MG tablet Take 1 tablet (10 mg total) by mouth every 6 (six) hours as needed for nausea or vomiting. 30 tablet 0  . sildenafil (VIAGRA) 25 MG tablet TAKE 1 TABLET BY MOUTH 60UMINUTES PRIOR TOEENCOUNTER.S    . spironolactone (ALDACTONE) 25 MG tablet TAKE 1 TABLET BY MOUTH EVERY DAY 90 tablet 0  . sucralfate (CARAFATE) 1 g tablet Take 1 tablet (1 g total) by mouth 4 (four) times daily -  with meals and at bedtime. 5 min before meals for radiation induced esophagitis 120 tablet 2  . tamsulosin (FLOMAX) 0.4 MG CAPS capsule Take 1 capsule by mouth daily.    . Tiotropium Bromide-Olodaterol (STIOLTO RESPIMAT)  2.5-2.5 MCG/ACT AERS Inhale 2 puffs into the lungs daily. 1 Inhaler 5  . umeclidinium-vilanterol (ANORO ELLIPTA) 62.5-25 MCG/INH AEPB Inhale 1 puff into the lungs daily. 1 each 0   No current facility-administered medications for this visit.    SURGICAL HISTORY:  Past Surgical History:  Procedure Laterality Date  . AORTOGRAM  08/08/2009   for LLE claudication     By Dr. Oneida Alar  . 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 BIOPSY N/A 05/30/2014   Procedure: BIOPSY;  Surgeon: [name], MD;  Location: [facility];  Service: Endoscopy;  Laterality: N/A;  . BLADDER TUMOR EXCISION  8/812  . CARDIAC CATHETERIZATION N/A 05/26/2016   Procedure: Left Heart Cath and Coronary Angiography;  Surgeon: [name], MD;  Location: [facility];  Service: Cardiovascular;  Laterality: N/A;  . CARDIAC CATHETERIZATION N/A 05/26/2016   Procedure: Coronary Stent Intervention;  Surgeon: [name], MD;  Location: [facility];  Service: Cardiovascular;  Laterality: N/A;  . COLONOSCOPY WITH PROPOFOL N/A 05/30/2014   SEG:BTDVVO colonic polyp-likely source of hematochezia-removed as described above  .  ESOPHAGOGASTRODUODENOSCOPY (EGD) WITH PROPOFOL N/A 05/30/2014   OIZ:TIWPYK and bulbar erosions s/p gastric biopsy. No evidence of portal gastropathy on today's examination.  . FEMORAL-TIBIAL BYPASS GRAFT  2009   Right side using non-reversed GSV   By Dr. Oneida Alar  . FEMORAL-TIBIAL BYPASS GRAFT  11/24/2007   Right femoral to anterior tibial BPG   by Dr. Oneida Alar  . FINGER SURGERY Left    "straightened my pinky"  . HAND TENDON SURGERY Left 2013   Left 5th finger  . HERNIA REPAIR     "stomach"  . ICD IMPLANT N/A 03/02/2018   Procedure: ICD IMPLANT;  Surgeon: [name], MD;  Location: [facility];  Service: Cardiovascular;  Laterality: N/A;  . INCISIONAL HERNIA REPAIR N/A 12/25/2014   Procedure: HERNIA REPAIR INCISIONAL WITH MESH;  Surgeon: [name], MD;  Location: [facility];   Service: General;  Laterality: N/A;  . INSERTION OF MESH N/A 12/25/2014   Procedure: INSERTION OF MESH;  Surgeon: [name], MD;  Location: [facility];  Service: General;  Laterality: N/A;  . IR IMAGING GUIDED PORT INSERTION  04/11/2018  . LAPAROSCOPIC CHOLECYSTECTOMY    . LOWER EXTREMITY ANGIOGRAM Left 12/30/2015   Procedure: Lower Extremity Angiogram;  Surgeon: [name], MD;  Location: [facility];  Service: Cardiovascular;  Laterality: Left;  . PERIPHERAL VASCULAR CATHETERIZATION N/A 12/30/2015   Procedure: Abdominal Aortogram;  Surgeon: [name], MD;  Location: [facility];  Service: Cardiovascular;  Laterality: N/A;  . POLYPECTOMY  05/30/2014   Procedure: POLYPECTOMY;  Surgeon: [name], MD;  Location: [facility];  Service: Endoscopy;;  . TONSILLECTOMY AND ADENOIDECTOMY  ~ 1970  . TYMPANOSTOMY TUBE PLACEMENT Bilateral ~ 1970    REVIEW OF SYSTEMS:   Review of Systems  Constitutional: Negative for appetite change, chills, fatigue, fever and unexpected weight change.  HENT: Negative for mouth sores, nosebleeds, sore throat and trouble swallowing.   Eyes: Negative for eye problems and icterus.  Respiratory: Positive for baseline cough and shortness of breath. Negative for hemoptysis and wheezing.   Cardiovascular: Negative for chest pain and leg swelling.  Gastrointestinal: Negative for abdominal pain, constipation, diarrhea, nausea and vomiting.  Genitourinary: Negative for bladder incontinence, difficulty urinating, dysuria, frequency and hematuria.   Musculoskeletal: Negative for back pain, gait problem, neck pain and neck stiffness.  Skin: Negative for itching and rash.  Neurological: Negative for dizziness, extremity weakness, gait problem, headaches, light-headedness and seizures.  Hematological: Negative for adenopathy. Does not bruise/bleed easily.  Psychiatric/Behavioral: Negative for confusion, depression and sleep disturbance. The patient is not  nervous/anxious.      PHYSICAL EXAMINATION:  Blood pressure 91/68, pulse 76, temperature (!) 97.5 F (36.4 C), temperature source Temporal, resp. rate 17, height '6\' 1"'$  (1.854 m), weight 235 lb 14.4 oz (107 kg), SpO2 100 %.  ECOG PERFORMANCE STATUS: 2 - Symptomatic, <50% confined to bed  Physical Exam  Constitutional: Oriented to person, place, and time and well-developed, well-nourished, and in no distress.  HENT:  Head: Normocephalic and atraumatic.  Mouth/Throat: Oropharynx is clear and moist. No oropharyngeal exudate.  Eyes: Conjunctivae are normal. Right eye exhibits no discharge. Left eye exhibits no discharge. No scleral icterus.  Neck: Normal range of motion. Neck supple.  Cardiovascular: Normal rate, regular rhythm, normal heart sounds and intact distal pulses.  Pulmonary/Chest:Effort normal and breath sounds normal. No respiratory distress. No rales.  Abdominal: Soft. Bowel sounds are normal. Exhibits no distension and no mass. There  is no tenderness.  Musculoskeletal: BKA noted on the right leg.Normal range of motion. Exhibits no edema.  Lymphadenopathy:  No cervical adenopathy.  Neurological: Alert and oriented to person, place, and time. Exhibits normal muscle tone. Gait normal. Coordination normal.  Skin: Skin is warm and dry. No rash noted. Not diaphoretic. No erythema. No pallor.  Psychiatric: Mood, memory and judgment normal.  Vitals reviewed.  LABORATORY DATA: Lab Results  Component Value Date   WBC 9.4 03/19/2020   HGB 13.8 03/19/2020   HCT 42.1 03/19/2020   MCV 96.1 03/19/2020   PLT 240 03/19/2020      Chemistry      Component Value Date/Time   NA 136 03/19/2020 1425   NA 139 02/27/2018 1457   K 3.9 03/19/2020 1425   CL 98 03/19/2020 1425   CO2 30 03/19/2020 1425   BUN 11 03/19/2020 1425   BUN 7 02/27/2018 1457   CREATININE 1.20 03/19/2020 1425   CREATININE 1.13 06/28/2016 1453      Component Value Date/Time   CALCIUM 9.2 03/19/2020 1425    ALKPHOS 119 03/19/2020 1425   AST 22 03/19/2020 1425   ALT 25 03/19/2020 1425   BILITOT 0.4 03/19/2020 1425       RADIOGRAPHIC STUDIES:  CUP PACEART REMOTE DEVICE CHECK  Result Date: 03/05/2020 Scheduled remote reviewed. Normal device function.  Next remote 91 days. Kathy Breach, RN, CCDS, [employer]    ASSESSMENT/PLAN:  This is a very pleasant 59 year old Caucasian male with stage IIIa non-small cell lung cancer, adenosquamous carcinoma. He presented with a large right upper lobe lung mass with questionable chest wall invasion as well as a right hilar and mediastinal lymphadenopathy. He was diagnosed in June 2019. His PDL1 expression is 99%  He completed 6 cycles of concurrent chemoradiation with carboplatin and paclitaxel. He had a partial response to treatment.  The patient is currently undergoing consolidation immunotherapy with Imfinzi 10 mg/kg IV every 2 weeks. He is status post 26 cycles. He completed this on 07/04/2019  He was on observation until he showed evidence of local recurrence.   He is currently undergoing systemic chemotherapy with Keytruda 200 mg IV every 3 weeks. He is status post 1 cycle. He tolerated this well without any concerning complains.   Labs were reviewed. Recommend that he proceed with cycle #2 today as scheduled. We will see him back for a follow up visit in 3 weeks for evaluation before starting cycle #3.   He will continue to use his stool softener for constipation.   The patient knows that we strongly encourage smoking cessation. He is not interested in quitting at this time.  We will monitor his TSH closely and make adjustments as necessary. TSH pending at this time.   The patient was advised to call immediately if he has any concerning symptoms in the interval. The patient voices understanding of current disease status and treatment options and is in agreement with the current care plan. All questions were answered. The  patient knows to call the clinic with any problems, questions or concerns. We can certainly see the patient much sooner if necessary    No orders of the defined types were placed in this encounter.    Natasa Stigall L Arnav Cregg, PA-C 03/19/20

## 2020-03-19 ENCOUNTER — Other Ambulatory Visit: Payer: Self-pay

## 2020-03-19 ENCOUNTER — Inpatient Hospital Stay: Payer: Medicare Other

## 2020-03-19 ENCOUNTER — Inpatient Hospital Stay (HOSPITAL_BASED_OUTPATIENT_CLINIC_OR_DEPARTMENT_OTHER): Payer: Medicare Other | Admitting: Physician Assistant

## 2020-03-19 VITALS — BP 91/68 | HR 76 | Temp 97.5°F | Resp 17 | Ht 73.0 in | Wt 235.9 lb

## 2020-03-19 DIAGNOSIS — E7800 Pure hypercholesterolemia, unspecified: Secondary | ICD-10-CM | POA: Diagnosis not present

## 2020-03-19 DIAGNOSIS — Z79899 Other long term (current) drug therapy: Secondary | ICD-10-CM | POA: Diagnosis not present

## 2020-03-19 DIAGNOSIS — C3491 Malignant neoplasm of unspecified part of right bronchus or lung: Secondary | ICD-10-CM

## 2020-03-19 DIAGNOSIS — Z7982 Long term (current) use of aspirin: Secondary | ICD-10-CM | POA: Diagnosis not present

## 2020-03-19 DIAGNOSIS — Z5112 Encounter for antineoplastic immunotherapy: Secondary | ICD-10-CM | POA: Diagnosis not present

## 2020-03-19 DIAGNOSIS — I252 Old myocardial infarction: Secondary | ICD-10-CM | POA: Diagnosis not present

## 2020-03-19 DIAGNOSIS — J449 Chronic obstructive pulmonary disease, unspecified: Secondary | ICD-10-CM | POA: Diagnosis not present

## 2020-03-19 DIAGNOSIS — Z95828 Presence of other vascular implants and grafts: Secondary | ICD-10-CM

## 2020-03-19 DIAGNOSIS — Z95810 Presence of automatic (implantable) cardiac defibrillator: Secondary | ICD-10-CM | POA: Diagnosis not present

## 2020-03-19 DIAGNOSIS — Z86718 Personal history of other venous thrombosis and embolism: Secondary | ICD-10-CM | POA: Diagnosis not present

## 2020-03-19 DIAGNOSIS — C3411 Malignant neoplasm of upper lobe, right bronchus or lung: Secondary | ICD-10-CM | POA: Diagnosis not present

## 2020-03-19 LAB — CBC WITH DIFFERENTIAL (CANCER CENTER ONLY)
Abs Immature Granulocytes: 0.09 10*3/uL — ABNORMAL HIGH (ref 0.00–0.07)
Basophils Absolute: 0.1 10*3/uL (ref 0.0–0.1)
Basophils Relative: 1 %
Eosinophils Absolute: 0.5 10*3/uL (ref 0.0–0.5)
Eosinophils Relative: 5 %
HCT: 42.1 % (ref 39.0–52.0)
Hemoglobin: 13.8 g/dL (ref 13.0–17.0)
Immature Granulocytes: 1 %
Lymphocytes Relative: 7 %
Lymphs Abs: 0.7 10*3/uL (ref 0.7–4.0)
MCH: 31.5 pg (ref 26.0–34.0)
MCHC: 32.8 g/dL (ref 30.0–36.0)
MCV: 96.1 fL (ref 80.0–100.0)
Monocytes Absolute: 0.6 10*3/uL (ref 0.1–1.0)
Monocytes Relative: 6 %
Neutro Abs: 7.5 10*3/uL (ref 1.7–7.7)
Neutrophils Relative %: 80 %
Platelet Count: 240 10*3/uL (ref 150–400)
RBC: 4.38 MIL/uL (ref 4.22–5.81)
RDW: 14.6 % (ref 11.5–15.5)
WBC Count: 9.4 10*3/uL (ref 4.0–10.5)
nRBC: 0 % (ref 0.0–0.2)

## 2020-03-19 LAB — CMP (CANCER CENTER ONLY)
ALT: 25 U/L (ref 0–44)
AST: 22 U/L (ref 15–41)
Albumin: 3.9 g/dL (ref 3.5–5.0)
Alkaline Phosphatase: 119 U/L (ref 38–126)
Anion gap: 8 (ref 5–15)
BUN: 11 mg/dL (ref 6–20)
CO2: 30 mmol/L (ref 22–32)
Calcium: 9.2 mg/dL (ref 8.9–10.3)
Chloride: 98 mmol/L (ref 98–111)
Creatinine: 1.2 mg/dL (ref 0.61–1.24)
GFR, Est AFR Am: >60 mL/min (ref >60)
GFR, Estimated: >60 mL/min (ref >60)
Glucose, Bld: 144 mg/dL — ABNORMAL HIGH (ref 70–99)
Potassium: 3.9 mmol/L (ref 3.5–5.1)
Sodium: 136 mmol/L (ref 135–145)
Total Bilirubin: 0.4 mg/dL (ref 0.3–1.2)
Total Protein: 7 g/dL (ref 6.5–8.1)

## 2020-03-19 LAB — TSH: TSH: 12.044 u[IU]/mL — ABNORMAL HIGH (ref 0.320–4.118)

## 2020-03-19 MED ORDER — SODIUM CHLORIDE 0.9% FLUSH
10.0000 mL | Freq: Once | INTRAVENOUS | Status: AC
  Administered 2020-03-19: 10 mL
  Filled 2020-03-19: qty 10

## 2020-03-19 MED ORDER — SODIUM CHLORIDE 0.9% FLUSH
10.0000 mL | INTRAVENOUS | Status: DC | PRN
  Administered 2020-03-19: 10 mL
  Filled 2020-03-19: qty 10

## 2020-03-19 MED ORDER — HEPARIN SOD (PORK) LOCK FLUSH 100 UNIT/ML IV SOLN
500.0000 [IU] | Freq: Once | INTRAVENOUS | Status: AC | PRN
  Administered 2020-03-19: 500 [IU]
  Filled 2020-03-19: qty 5

## 2020-03-19 MED ORDER — SODIUM CHLORIDE 0.9 % IV SOLN
Freq: Once | INTRAVENOUS | Status: AC
  Filled 2020-03-19: qty 250

## 2020-03-19 MED ORDER — SODIUM CHLORIDE 0.9 % IV SOLN
200.0000 mg | Freq: Once | INTRAVENOUS | Status: AC
  Administered 2020-03-19: 200 mg via INTRAVENOUS
  Filled 2020-03-19: qty 8

## 2020-03-19 NOTE — Patient Instructions (Signed)
[facility] Discharge Instructions for Patients Receiving Chemotherapy  Today you received the following chemotherapy agents: pembrolizumab Beryle Flock).  To help prevent nausea and vomiting after your treatment, we encourage you to take your nausea medication as directed.   If you develop nausea and vomiting that is not controlled by your nausea medication, call the clinic.   BELOW ARE SYMPTOMS THAT SHOULD BE REPORTED IMMEDIATELY:  *FEVER GREATER THAN 100.5 F  *CHILLS WITH OR WITHOUT FEVER  NAUSEA AND VOMITING THAT IS NOT CONTROLLED WITH YOUR NAUSEA MEDICATION  *UNUSUAL SHORTNESS OF BREATH  *UNUSUAL BRUISING OR BLEEDING  TENDERNESS IN MOUTH AND THROAT WITH OR WITHOUT PRESENCE OF ULCERS  *URINARY PROBLEMS  *BOWEL PROBLEMS  UNUSUAL RASH Items with * indicate a potential emergency and should be followed up as soon as possible.  Feel free to call the clinic should you have any questions or concerns. The clinic phone number is [phone].  Please show the [facility] at check-in to the Emergency Department and triage nurse.  Pembrolizumab injection What is this medicine? PEMBROLIZUMAB (pem broe liz ue mab) is a monoclonal antibody. It is used to treat certain types of cancer. This medicine may be used for other purposes; ask your health care provider or pharmacist if you have questions. COMMON BRAND NAME(S): Keytruda What should I tell my health care provider before I take this medicine? They need to know if you have any of these conditions:  diabetes  immune system problems  inflammatory bowel disease  liver disease  lung or breathing disease  lupus  received or scheduled to receive an organ transplant or a stem-cell transplant that uses donor stem cells  an unusual or allergic reaction to pembrolizumab, other medicines, foods, dyes, or preservatives  pregnant or trying to get pregnant  breast-feeding How should I use this medicine? This  medicine is for infusion into a vein. It is given by a health care professional in a hospital or clinic setting. A special MedGuide will be given to you before each treatment. Be sure to read this information carefully each time. Talk to your pediatrician regarding the use of this medicine in children. While this drug may be prescribed for children as young as 6 months for selected conditions, precautions do apply. Overdosage: If you think you have taken too much of this medicine contact a poison control center or emergency room at once. NOTE: This medicine is only for you. Do not share this medicine with others. What if I miss a dose? It is important not to miss your dose. Call your doctor or health care professional if you are unable to keep an appointment. What may interact with this medicine? Interactions have not been studied. Give your health care provider a list of all the medicines, herbs, non-prescription drugs, or dietary supplements you use. Also tell them if you smoke, drink alcohol, or use illegal drugs. Some items may interact with your medicine. This list may not describe all possible interactions. Give your health care provider a list of all the medicines, herbs, non-prescription drugs, or dietary supplements you use. Also tell them if you smoke, drink alcohol, or use illegal drugs. Some items may interact with your medicine. What should I watch for while using this medicine? Your condition will be monitored carefully while you are receiving this medicine. You may need blood work done while you are taking this medicine. Do not become pregnant while taking this medicine or for 4 months after stopping it. Women  should inform their doctor if they wish to become pregnant or think they might be pregnant. There is a potential for serious side effects to an unborn child. Talk to your health care professional or pharmacist for more information. Do not breast-feed an infant while taking this  medicine or for 4 months after the last dose. What side effects may I notice from receiving this medicine? Side effects that you should report to your doctor or health care professional as soon as possible:  allergic reactions like skin rash, itching or hives, swelling of the face, lips, or tongue  bloody or black, tarry  breathing problems  changes in vision  chest pain  chills  confusion  constipation  cough  diarrhea  dizziness or feeling faint or lightheaded  fast or irregular heartbeat  fever  flushing  joint pain  low blood counts - this medicine may decrease the number of white blood cells, red blood cells and platelets. You may be at increased risk for infections and bleeding.  muscle pain  muscle weakness  pain, tingling, numbness in the hands or feet  persistent headache  redness, blistering, peeling or loosening of the skin, including inside the mouth  signs and symptoms of high blood sugar such as dizziness; dry mouth; dry skin; fruity breath; nausea; stomach pain; increased hunger or thirst; increased urination  signs and symptoms of kidney injury like trouble passing urine or change in the amount of urine  signs and symptoms of liver injury like dark urine, light-colored stools, loss of appetite, nausea, right upper belly pain, yellowing of the eyes or skin  sweating  swollen lymph nodes  weight loss Side effects that usually do not require medical attention (report to your doctor or health care professional if they continue or are bothersome):  decreased appetite  hair loss  muscle pain  tiredness This list may not describe all possible side effects. Call your doctor for medical advice about side effects. You may report side effects to FDA at 1-800-FDA-1088. Where should I keep my medicine? This drug is given in a hospital or clinic and will not be stored at home. NOTE: This sheet is a summary. It may not cover all possible  information. If you have questions about this medicine, talk to your doctor, pharmacist, or health care provider.  2020 Elsevier/Gold Standard (2019-07-20 18:07:58)

## 2020-03-26 DIAGNOSIS — C3491 Malignant neoplasm of unspecified part of right bronchus or lung: Secondary | ICD-10-CM | POA: Diagnosis not present

## 2020-03-26 DIAGNOSIS — E063 Autoimmune thyroiditis: Secondary | ICD-10-CM | POA: Diagnosis not present

## 2020-03-26 DIAGNOSIS — J449 Chronic obstructive pulmonary disease, unspecified: Secondary | ICD-10-CM | POA: Diagnosis not present

## 2020-03-26 DIAGNOSIS — Z720 Tobacco use: Secondary | ICD-10-CM | POA: Diagnosis not present

## 2020-04-01 ENCOUNTER — Telehealth: Payer: Self-pay | Admitting: Internal Medicine

## 2020-04-01 DIAGNOSIS — J449 Chronic obstructive pulmonary disease, unspecified: Secondary | ICD-10-CM | POA: Diagnosis not present

## 2020-04-01 DIAGNOSIS — G894 Chronic pain syndrome: Secondary | ICD-10-CM | POA: Diagnosis not present

## 2020-04-01 DIAGNOSIS — C3490 Malignant neoplasm of unspecified part of unspecified bronchus or lung: Secondary | ICD-10-CM | POA: Diagnosis not present

## 2020-04-01 NOTE — Telephone Encounter (Signed)
Pt called in to reschedule 7/14 appt - wife said to cancel.   Patient going out of town and unsure when they will be back . Per Pt wife they will call back when they are back in town.    Message sent to MD and Rn so they are aware.

## 2020-04-09 ENCOUNTER — Ambulatory Visit: Payer: Medicare Other | Admitting: Physician Assistant

## 2020-04-09 ENCOUNTER — Other Ambulatory Visit: Payer: Medicare Other

## 2020-04-09 ENCOUNTER — Ambulatory Visit: Payer: Medicare Other

## 2020-04-16 NOTE — Progress Notes (Signed)
[facility] OFFICE PROGRESS NOTE  Redmond School, MD [address] [place] [address]  DIAGNOSIS: Stage IIIA (T3, N2, M0) non-small cell lung cancer, adenosquamous carcinoma diagnosed in June 2019 and presented with large right upper lobe lung mass with questionable chest wall invasion as well as right hilar and mediastinal lymphadenopathy.  Biomarker Findings Tumor Mutational Burden - TMB-High (21 Muts/Mb) Microsatellite status - MS-Stable Genomic Findings For a complete list of the genes assayed, please refer to the Appendix. ATM J8250* CCND2 P281R KRAS G12V CHEK2 T391f*15 TP53 E298* 7 Disease relevant genes with no reportable alterations: ALK, EGFR, BRAF, MET, RET, ERBB2, ROS1  PDL1 Expression: 99%.  PRIOR THERAPY:  1) Concurrent chemoradiation with chemotherapy consisting of weekly carboplatin for an AUC of 2 and paclitaxel 45 mg/m2. First dose given on 04/03/2018.Status post 6 cycles. Last dose was giving 05/15/2018 with partial response. 2) Consolidation treatment with immunotherapy with Imfinzi (Durvalumab) 10 mg/KG every 2 weeks. First dose 06/20/2018. Status post 26 cycles.  CURRENT THERAPY: Systemic treatment with immunotherapy with Keytruda 200 mg IV every 3 weeks. First dose February 27, 2020. Status post 2 cycles.   INTERVAL HISTORY: SCAYDE HELD59y.o. male returns to the clinic for a follow up visit. The patient is feeling well today without any concerning complaints. The patient has been tolerating his treatment with immunotherapy well with out any adverse side effects. Denies any fever, chills, night sweats, or weight loss. Denies any chest pain or hemoptysis. He reports his baseline shortness of breath and cough but denies any chest pain or hemoptysis. Patient continues to smoke cigarettes which contributes to his respiratory complaints. He smokes a little over a half of a cigarettes per day. Denies any nausea, vomiting, or diarrhea. Denies  any headache or visual changes. Denies any rashes or skin changes. He reports his baseline constipation for which he takes over-the-counter stool softener daily whichreportedly works well for him. The patient is here today for evaluation prior to starting cycle #3    MEDICAL HISTORY: Past Medical History:  Diagnosis Date  . Acute hepatitis C virus infection 06/19/2007   Qualifier: Diagnosis of  By: MLeilani MerlCMA, Tiffany    . Acute ST elevation myocardial infarction (STEMI) involving left anterior descending (LAD) coronary artery ([facility] 05/26/2016  . Acute ST elevation myocardial infarction (STEMI) of lateral wall ([facility] 05/26/2016  . 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 Anxiety   . Asthma   . Atherosclerosis of native arteries of the extremities with intermittent claudication 12/16/2011  . Chronic hepatitis C without hepatic coma ([facility] 05/03/2014  . COPD with chronic bronchitis and emphysema ([facility] 03/13/2018   FEV1 57%  . Depression   . DVT (deep venous thrombosis) ([facility] 2009; 2019   RLE; LLE  . Encounter for antineoplastic chemotherapy 03/22/2018  . Hepatitis C    "tx'd in 2015"  . High cholesterol   . Ischemic cardiomyopathy 03/02/2018  . Non-small cell carcinoma of right lung, stage 3 ([facility] 03/22/2018  . PAD (peripheral artery disease) ([facility] 01/25/2013  . Peripheral vascular disease, unspecified 07/20/2012  . Port-A-Cath in place 04/24/2018  . ST elevation myocardial infarction involving left anterior descending (LAD) coronary artery ([facility]   . STEMI (ST elevation myocardial infarction) ([facility] 05/26/2016  . Tobacco abuse 01/28/2016  . Tubular adenoma of colon 07/11/2014  . Urinary dribbling     ALLERGIES:  is allergic to lyrica [pregabalin] and neurontin [gabapentin].  MEDICATIONS:  Current Outpatient Medications  Medication Sig Dispense Refill  .  alprazolam (XANAX) 2 MG tablet Take 2 mg by mouth 4 (four) times daily as needed for anxiety.     [name] amitriptyline  (ELAVIL) 100 MG tablet Take 100 mg by mouth at bedtime.     [name] aspirin 81 MG EC tablet Take 81 mg by mouth daily.      [name] atorvastatin (LIPITOR) 80 MG tablet Take 1 tablet (80 mg total) by mouth daily at 6 PM. 30 tablet 12  . carvedilol (COREG) 6.25 MG tablet Take 1 tablet (6.25 mg total) by mouth 2 (two) times daily with a meal. OFFICE VISIT NEEDED 60 tablet 8  . citalopram (CELEXA) 40 MG tablet Take 40 mg by mouth daily.  11  . furosemide (LASIX) 20 MG tablet Take 1 tablet (20 mg total) by mouth daily. Please make overdue appt with Dr. Oval Linsey before anymore refills. 2nd attempt 15 tablet 0  . HYDROcodone-acetaminophen (NORCO) 10-325 MG tablet Take 1 tablet by mouth every 4 (four) hours as needed for moderate pain.   0  . levothyroxine (SYNTHROID) 125 MCG tablet TAKE 1 TABLET BY MOUTH EVERY DAY 30 tablet 2  . lidocaine-prilocaine (EMLA) cream Apply 1 application topically as needed. 30 g 0  . lisinopril (PRINIVIL,ZESTRIL) 2.5 MG tablet Take 1 tablet (2.5 mg total) by mouth daily. 30 tablet 12  . nitroGLYCERIN (NITROSTAT) 0.4 MG SL tablet Place 1 tablet (0.4 mg total) under the tongue every 5 (five) minutes x 3 doses as needed for chest pain. 25 tablet 2  . prochlorperazine (COMPAZINE) 10 MG tablet Take 1 tablet (10 mg total) by mouth every 6 (six) hours as needed for nausea or vomiting. 30 tablet 0  . sildenafil (VIAGRA) 25 MG tablet TAKE 1 TABLET BY MOUTH 60UMINUTES PRIOR TOEENCOUNTER.S    . spironolactone (ALDACTONE) 25 MG tablet TAKE 1 TABLET BY MOUTH EVERY DAY 90 tablet 0  . sucralfate (CARAFATE) 1 g tablet Take 1 tablet (1 g total) by mouth 4 (four) times daily -  with meals and at bedtime. 5 min before meals for radiation induced esophagitis 120 tablet 2  . tamsulosin (FLOMAX) 0.4 MG CAPS capsule Take 1 capsule by mouth daily.    . Tiotropium Bromide-Olodaterol (STIOLTO RESPIMAT) 2.5-2.5 MCG/ACT AERS Inhale 2 puffs into the lungs daily. 1 Inhaler 5  . umeclidinium-vilanterol (ANORO ELLIPTA)  62.5-25 MCG/INH AEPB Inhale 1 puff into the lungs daily. 1 each 0  . levETIRAcetam (KEPPRA) 750 MG tablet TAKE 1 TABLET BY MOUTH TWICE A DAY (Patient not taking: Reported on 02/13/2020) 60 tablet 5   No current facility-administered medications for this visit.   Facility-Administered Medications Ordered in Other Visits  Medication Dose Route Frequency Provider Last Rate Last Admin  . heparin lock flush 100 unit/mL  500 Units Intracatheter Once PRN Curt Bears, MD      . pembrolizumab [facility]) 200 mg in sodium chloride 0.9 % 50 mL chemo infusion  200 mg Intravenous Once Curt Bears, MD 116 mL/hr at 04/17/20 1544 200 mg at 04/17/20 1544  . sodium chloride flush (NS) 0.9 % injection 10 mL  10 mL Intracatheter PRN Curt Bears, MD   10 mL at 03/19/20 1544  . sodium chloride flush (NS) 0.9 % injection 10 mL  10 mL Intracatheter PRN Curt Bears, MD        SURGICAL HISTORY:  Past Surgical History:  Procedure Laterality Date  . AORTOGRAM  08/08/2009   for LLE claudication     By Dr. Oneida Alar  . BELOW KNEE LEG AMPUTATION  Right 04/25/2008  . BIOPSY N/A 05/30/2014   Procedure: BIOPSY;  Surgeon: [name], MD;  Location: [facility];  Service: Endoscopy;  Laterality: N/A;  . BLADDER TUMOR EXCISION  8/812  . CARDIAC CATHETERIZATION N/A 05/26/2016   Procedure: Left Heart Cath and Coronary Angiography;  Surgeon: [name], MD;  Location: [facility];  Service: Cardiovascular;  Laterality: N/A;  . CARDIAC CATHETERIZATION N/A 05/26/2016   Procedure: Coronary Stent Intervention;  Surgeon: [name], MD;  Location: [facility];  Service: Cardiovascular;  Laterality: N/A;  . COLONOSCOPY WITH PROPOFOL N/A 05/30/2014   JKK:[id number] colonic polyp-likely source of hematochezia-removed as described above  . ESOPHAGOGASTRODUODENOSCOPY (EGD) WITH PROPOFOL N/A 05/30/2014   XHB:ZJIRCV and bulbar erosions s/p gastric biopsy. No evidence of portal gastropathy on today's examination.  .  FEMORAL-TIBIAL BYPASS GRAFT  2009   Right side using non-reversed GSV   By Dr. Oneida Alar  . FEMORAL-TIBIAL BYPASS GRAFT  11/24/2007   Right femoral to anterior tibial BPG   by Dr. Oneida Alar  . FINGER SURGERY Left    "straightened my pinky"  . HAND TENDON SURGERY Left 2013   Left 5th finger  . HERNIA REPAIR     "stomach"  . ICD IMPLANT N/A 03/02/2018   Procedure: ICD IMPLANT;  Surgeon: [name], MD;  Location: [facility];  Service: Cardiovascular;  Laterality: N/A;  . INCISIONAL HERNIA REPAIR N/A 12/25/2014   Procedure: HERNIA REPAIR INCISIONAL WITH MESH;  Surgeon: [name], MD;  Location: [facility];  Service: General;  Laterality: N/A;  . INSERTION OF MESH N/A 12/25/2014   Procedure: INSERTION OF MESH;  Surgeon: [name], MD;  Location: [facility];  Service: General;  Laterality: N/A;  . IR IMAGING GUIDED PORT INSERTION  04/11/2018  . LAPAROSCOPIC CHOLECYSTECTOMY    . LOWER EXTREMITY ANGIOGRAM Left 12/30/2015   Procedure: Lower Extremity Angiogram;  Surgeon: [name], MD;  Location: [facility];  Service: Cardiovascular;  Laterality: Left;  . PERIPHERAL VASCULAR CATHETERIZATION N/A 12/30/2015   Procedure: Abdominal Aortogram;  Surgeon: [name], MD;  Location: [facility];  Service: Cardiovascular;  Laterality: N/A;  . POLYPECTOMY  05/30/2014   Procedure: POLYPECTOMY;  Surgeon: [name], MD;  Location: [facility];  Service: Endoscopy;;  . TONSILLECTOMY AND ADENOIDECTOMY  ~ 1970  . TYMPANOSTOMY TUBE PLACEMENT Bilateral ~ 1970    REVIEW OF SYSTEMS:   Review of Systems  Constitutional: Negative for appetite change, chills, fatigue, fever and unexpected weight change.  HENT: Negative for mouth sores, nosebleeds, sore throat and trouble swallowing.  Eyes: Negative for eye problems and icterus.  Respiratory:Positive for baseline cough and shortness of breath.Negative for hemoptysis and wheezing.  Cardiovascular: Negative for chest pain and leg  swelling.  Gastrointestinal: Negative for abdominal pain, constipation, diarrhea, nausea and vomiting.  Genitourinary: Negative for bladder incontinence, difficulty urinating, dysuria, frequency and hematuria.  Musculoskeletal: Negative for back pain, gait problem, neck pain and neck stiffness.  Skin: Negative for itching and rash.  Neurological: Negative for dizziness, extremity weakness, gait problem, headaches, light-headedness and seizures.  Hematological: Negative for adenopathy. Does not bruise/bleed easily.  Psychiatric/Behavioral: Negative for confusion, depression and sleep disturbance. The patient is not nervous/anxious.    PHYSICAL EXAMINATION:  Blood pressure 124/85, pulse 77, temperature (!) 97.3 F (36.3 C), temperature source Temporal, resp. rate 18, height '6\' 1"'$  (1.854 m), weight (!) 232 lb 11.2 oz (105.6 kg), SpO2 100 %.  ECOG PERFORMANCE STATUS: 1 - Symptomatic but completely ambulatory  Physical Exam  Constitutional: Oriented to person, place, and time and well-developed, well-nourished, and in no distress.  HENT:  Head: Normocephalic and atraumatic.  Mouth/Throat: Oropharynx is clear and moist. No oropharyngeal exudate.  Eyes: Conjunctivae are normal. Right eye exhibits no discharge. Left eye exhibits no discharge. No scleral icterus.  Neck: Normal range of motion. Neck supple.  Cardiovascular: Normal rate, regular rhythm, normal heart sounds and intact distal pulses.  Pulmonary/Chest:Effort normal and breath sounds normal. No respiratory distress. No rales.  Abdominal: Soft. Bowel sounds are normal. Exhibits no distension and no mass. There is no tenderness.  Musculoskeletal: BKA noted on the right leg.Normal range of motion. Exhibits no edema.  Lymphadenopathy:  No cervical adenopathy.  Neurological: Alert and oriented to person, place, and time. Exhibits normal muscle tone. Gait normal. Coordination normal.  Skin: Skin is warm and dry. No rash noted. Not  diaphoretic. No erythema. No pallor.  Psychiatric: Mood, memory and judgment normal.  Vitals reviewed.  LABORATORY DATA: Lab Results  Component Value Date   WBC 8.4 04/17/2020   HGB 14.2 04/17/2020   HCT 43.3 04/17/2020   MCV 96.2 04/17/2020   PLT 257 04/17/2020      Chemistry      Component Value Date/Time   NA 135 04/17/2020 1408   NA 139 02/27/2018 1457   K 4.5 04/17/2020 1408   CL 95 (L) 04/17/2020 1408   CO2 32 04/17/2020 1408   BUN 11 04/17/2020 1408   BUN 7 02/27/2018 1457   CREATININE 1.07 04/17/2020 1408   CREATININE 1.13 06/28/2016 1453      Component Value Date/Time   CALCIUM 8.8 (L) 04/17/2020 1408   ALKPHOS 111 04/17/2020 1408   AST 24 04/17/2020 1408   ALT 23 04/17/2020 1408   BILITOT 0.9 04/17/2020 1408       RADIOGRAPHIC STUDIES:  No results found.   ASSESSMENT/PLAN:  This is a very pleasant 59 year old Caucasian male with stage IIIa non-small cell lung cancer, adenosquamous carcinoma. He presented with a large right upper lobe lung mass with questionable chest wall invasion as well as a right hilar and mediastinal lymphadenopathy. He was diagnosed in June 2019. His PDL1 expression is 99%  He completed 6 cycles of concurrent chemoradiation with carboplatin and paclitaxel. He had a partial response to treatment.  The patient is currently undergoing consolidation immunotherapy with Imfinzi 10 mg/kg IV every 2 weeks. He is status post26cycles. He completed this on 07/04/2019  He was on observation until he showed evidence of local recurrence.   He is currently undergoing systemic chemotherapy with Keytruda 200 mg IV every 3 weeks. He is status post 2 cycles. He tolerated this well without any concerning complains.   Labs were reviewed. Recommend that he proceed with cycle #3 today as scheduled. We will see him back for a follow up visit in 3 weeks for evaluation before starting cycle #4.   I will arrange for a restaging CT scan of the  chest prior to his next cycle of treatment.   He will continue to use his stool softener for constipation.   The patient knows that we strongly encourage smoking cessation. He is not interested in quitting at this time.  We will monitor his TSH closely and make adjustments as necessary. TSH pending at this time.   The patient was advised to call immediately if he has any concerning symptoms in the interval. The patient voices understanding of  current disease status and treatment options and is in agreement with the current care plan. All questions were answered. The patient knows to call the clinic with any problems, questions or concerns. We can certainly see the patient much sooner if necessary       Orders Placed This Encounter  Procedures  . CT Chest W Contrast    Standing Status:   Future    Standing Expiration Date:   04/17/2021    Order Specific Question:   If indicated for the ordered procedure, I authorize the administration of contrast media per Radiology protocol    Answer:   Yes    Order Specific Question:   Preferred imaging location?    Answer:   [facility]    Order Specific Question:   Radiology Contrast Protocol - do NOT remove file path    Answer:   [email or web address]     [place], PA-C 04/17/20

## 2020-04-17 ENCOUNTER — Other Ambulatory Visit: Payer: Self-pay

## 2020-04-17 ENCOUNTER — Inpatient Hospital Stay: Payer: Medicare Other

## 2020-04-17 ENCOUNTER — Inpatient Hospital Stay (HOSPITAL_BASED_OUTPATIENT_CLINIC_OR_DEPARTMENT_OTHER): Payer: Medicare Other | Admitting: Physician Assistant

## 2020-04-17 ENCOUNTER — Inpatient Hospital Stay: Payer: Medicare Other | Attending: Internal Medicine

## 2020-04-17 VITALS — BP 124/85 | HR 77 | Temp 97.3°F | Resp 18 | Ht 73.0 in | Wt 232.7 lb

## 2020-04-17 DIAGNOSIS — C3491 Malignant neoplasm of unspecified part of right bronchus or lung: Secondary | ICD-10-CM

## 2020-04-17 DIAGNOSIS — C3411 Malignant neoplasm of upper lobe, right bronchus or lung: Secondary | ICD-10-CM | POA: Diagnosis present

## 2020-04-17 DIAGNOSIS — Z8719 Personal history of other diseases of the digestive system: Secondary | ICD-10-CM | POA: Diagnosis not present

## 2020-04-17 DIAGNOSIS — Z5112 Encounter for antineoplastic immunotherapy: Secondary | ICD-10-CM

## 2020-04-17 DIAGNOSIS — F329 Major depressive disorder, single episode, unspecified: Secondary | ICD-10-CM | POA: Diagnosis not present

## 2020-04-17 DIAGNOSIS — F419 Anxiety disorder, unspecified: Secondary | ICD-10-CM | POA: Diagnosis not present

## 2020-04-17 DIAGNOSIS — J449 Chronic obstructive pulmonary disease, unspecified: Secondary | ICD-10-CM | POA: Insufficient documentation

## 2020-04-17 DIAGNOSIS — Z95828 Presence of other vascular implants and grafts: Secondary | ICD-10-CM

## 2020-04-17 DIAGNOSIS — Z7982 Long term (current) use of aspirin: Secondary | ICD-10-CM | POA: Insufficient documentation

## 2020-04-17 DIAGNOSIS — E7800 Pure hypercholesterolemia, unspecified: Secondary | ICD-10-CM | POA: Insufficient documentation

## 2020-04-17 DIAGNOSIS — Z79899 Other long term (current) drug therapy: Secondary | ICD-10-CM | POA: Diagnosis not present

## 2020-04-17 DIAGNOSIS — F17210 Nicotine dependence, cigarettes, uncomplicated: Secondary | ICD-10-CM | POA: Diagnosis not present

## 2020-04-17 DIAGNOSIS — K5900 Constipation, unspecified: Secondary | ICD-10-CM | POA: Insufficient documentation

## 2020-04-17 DIAGNOSIS — I252 Old myocardial infarction: Secondary | ICD-10-CM | POA: Diagnosis not present

## 2020-04-17 LAB — CMP (CANCER CENTER ONLY)
ALT: 23 U/L (ref 0–44)
AST: 24 U/L (ref 15–41)
Albumin: 4.4 g/dL (ref 3.5–5.0)
Alkaline Phosphatase: 111 U/L (ref 38–126)
Anion gap: 8 (ref 5–15)
BUN: 11 mg/dL (ref 6–20)
CO2: 32 mmol/L (ref 22–32)
Calcium: 8.8 mg/dL — ABNORMAL LOW (ref 8.9–10.3)
Chloride: 95 mmol/L — ABNORMAL LOW (ref 98–111)
Creatinine: 1.07 mg/dL (ref 0.61–1.24)
GFR, Est AFR Am: >60 mL/min
GFR, Estimated: >60 mL/min
Glucose, Bld: 88 mg/dL (ref 70–99)
Potassium: 4.5 mmol/L (ref 3.5–5.1)
Sodium: 135 mmol/L (ref 135–145)
Total Bilirubin: 0.9 mg/dL (ref 0.3–1.2)
Total Protein: 7.7 g/dL (ref 6.5–8.1)

## 2020-04-17 LAB — CBC WITH DIFFERENTIAL (CANCER CENTER ONLY)
Abs Immature Granulocytes: 0.11 10*3/uL — ABNORMAL HIGH (ref 0.00–0.07)
Basophils Absolute: 0.1 10*3/uL (ref 0.0–0.1)
Basophils Relative: 1 %
Eosinophils Absolute: 0.5 10*3/uL (ref 0.0–0.5)
Eosinophils Relative: 6 %
HCT: 43.3 % (ref 39.0–52.0)
Hemoglobin: 14.2 g/dL (ref 13.0–17.0)
Immature Granulocytes: 1 %
Lymphocytes Relative: 11 %
Lymphs Abs: 1 10*3/uL (ref 0.7–4.0)
MCH: 31.6 pg (ref 26.0–34.0)
MCHC: 32.8 g/dL (ref 30.0–36.0)
MCV: 96.2 fL (ref 80.0–100.0)
Monocytes Absolute: 0.7 10*3/uL (ref 0.1–1.0)
Monocytes Relative: 8 %
Neutro Abs: 6 10*3/uL (ref 1.7–7.7)
Neutrophils Relative %: 73 %
Platelet Count: 257 10*3/uL (ref 150–400)
RBC: 4.5 MIL/uL (ref 4.22–5.81)
RDW: 14.7 % (ref 11.5–15.5)
WBC Count: 8.4 10*3/uL (ref 4.0–10.5)
nRBC: 0 % (ref 0.0–0.2)

## 2020-04-17 MED ORDER — SODIUM CHLORIDE 0.9 % IV SOLN
200.0000 mg | Freq: Once | INTRAVENOUS | Status: AC
  Administered 2020-04-17: 200 mg via INTRAVENOUS
  Filled 2020-04-17: qty 8

## 2020-04-17 MED ORDER — HEPARIN SOD (PORK) LOCK FLUSH 100 UNIT/ML IV SOLN
500.0000 [IU] | Freq: Once | INTRAVENOUS | Status: AC | PRN
  Administered 2020-04-17: 500 [IU]
  Filled 2020-04-17: qty 5

## 2020-04-17 MED ORDER — SODIUM CHLORIDE 0.9% FLUSH
10.0000 mL | INTRAVENOUS | Status: DC | PRN
  Administered 2020-04-17: 10 mL
  Filled 2020-04-17: qty 10

## 2020-04-17 MED ORDER — SODIUM CHLORIDE 0.9 % IV SOLN
Freq: Once | INTRAVENOUS | Status: AC
  Filled 2020-04-17: qty 250

## 2020-04-17 MED ORDER — SODIUM CHLORIDE 0.9% FLUSH
10.0000 mL | Freq: Once | INTRAVENOUS | Status: AC
  Administered 2020-04-17: 10 mL
  Filled 2020-04-17: qty 10

## 2020-04-17 NOTE — Patient Instructions (Signed)
[facility] Discharge Instructions for Patients Receiving Chemotherapy  Today you received the following chemotherapy agents: pembrolizumab Beryle Flock).  To help prevent nausea and vomiting after your treatment, we encourage you to take your nausea medication as directed.   If you develop nausea and vomiting that is not controlled by your nausea medication, call the clinic.   BELOW ARE SYMPTOMS THAT SHOULD BE REPORTED IMMEDIATELY:  *FEVER GREATER THAN 100.5 F  *CHILLS WITH OR WITHOUT FEVER  NAUSEA AND VOMITING THAT IS NOT CONTROLLED WITH YOUR NAUSEA MEDICATION  *UNUSUAL SHORTNESS OF BREATH  *UNUSUAL BRUISING OR BLEEDING  TENDERNESS IN MOUTH AND THROAT WITH OR WITHOUT PRESENCE OF ULCERS  *URINARY PROBLEMS  *BOWEL PROBLEMS  UNUSUAL RASH Items with * indicate a potential emergency and should be followed up as soon as possible.  Feel free to call the clinic should you have any questions or concerns. The clinic phone number is [phone].  Please show the [facility] at check-in to the Emergency Department and triage nurse.

## 2020-04-17 NOTE — Patient Instructions (Signed)
-  I have placed an order for a CT scan of the chest to be done before we see you next time. We are due to see you again next time on 8/12. That means that I would like your scan to be done a few days before we see you next time (around ~8/10). Please be on the look out for phone call from the radiology department. They should be calling you in the next few weeks to schedule this scan. If you do not hear from radiology soon, go ahead and call them your self to schedule the scan. Call [phone]. If you need to call, all you have to do is tell them your name and DOB and tell them that you are trying to schedule your CT scan. With that information they will be able to open your chart and know what scan you are trying to schedule and they should be able to take it from there.

## 2020-04-18 ENCOUNTER — Telehealth: Payer: Self-pay | Admitting: Physician Assistant

## 2020-04-18 ENCOUNTER — Telehealth: Payer: Self-pay | Admitting: Internal Medicine

## 2020-04-18 LAB — TSH: TSH: 18.027 u[IU]/mL — ABNORMAL HIGH (ref 0.320–4.118)

## 2020-04-18 NOTE — Telephone Encounter (Signed)
Called pt per 7/23 sch msg - unable to reach pt . Left message for patient to call back if reschedule is still needed

## 2020-04-18 NOTE — Telephone Encounter (Signed)
Scheduled per los. Called, not able to leave msg. Mailed printout  

## 2020-04-25 DIAGNOSIS — C3491 Malignant neoplasm of unspecified part of right bronchus or lung: Secondary | ICD-10-CM | POA: Diagnosis not present

## 2020-04-25 DIAGNOSIS — Z720 Tobacco use: Secondary | ICD-10-CM | POA: Diagnosis not present

## 2020-04-25 DIAGNOSIS — J449 Chronic obstructive pulmonary disease, unspecified: Secondary | ICD-10-CM | POA: Diagnosis not present

## 2020-04-25 DIAGNOSIS — E063 Autoimmune thyroiditis: Secondary | ICD-10-CM | POA: Diagnosis not present

## 2020-04-28 DIAGNOSIS — G894 Chronic pain syndrome: Secondary | ICD-10-CM | POA: Diagnosis not present

## 2020-04-28 DIAGNOSIS — Z89511 Acquired absence of right leg below knee: Secondary | ICD-10-CM | POA: Diagnosis not present

## 2020-04-28 DIAGNOSIS — C3490 Malignant neoplasm of unspecified part of unspecified bronchus or lung: Secondary | ICD-10-CM | POA: Diagnosis not present

## 2020-04-28 DIAGNOSIS — I10 Essential (primary) hypertension: Secondary | ICD-10-CM | POA: Diagnosis not present

## 2020-04-28 DIAGNOSIS — G546 Phantom limb syndrome with pain: Secondary | ICD-10-CM | POA: Diagnosis not present

## 2020-05-06 ENCOUNTER — Ambulatory Visit (HOSPITAL_COMMUNITY)
Admission: RE | Admit: 2020-05-06 | Discharge: 2020-05-06 | Disposition: A | Discharge status: Home / Self Care | Payer: Medicare Other | Source: Ambulatory Visit | Attending: Physician Assistant | Admitting: Physician Assistant

## 2020-05-06 ENCOUNTER — Other Ambulatory Visit: Payer: Self-pay

## 2020-05-06 DIAGNOSIS — C3491 Malignant neoplasm of unspecified part of right bronchus or lung: Secondary | ICD-10-CM | POA: Diagnosis not present

## 2020-05-06 DIAGNOSIS — J929 Pleural plaque without asbestos: Secondary | ICD-10-CM | POA: Diagnosis not present

## 2020-05-06 DIAGNOSIS — R918 Other nonspecific abnormal finding of lung field: Secondary | ICD-10-CM | POA: Diagnosis not present

## 2020-05-06 DIAGNOSIS — J439 Emphysema, unspecified: Secondary | ICD-10-CM | POA: Diagnosis not present

## 2020-05-06 DIAGNOSIS — I2510 Atherosclerotic heart disease of native coronary artery without angina pectoris: Secondary | ICD-10-CM | POA: Diagnosis not present

## 2020-05-06 MED ORDER — HEPARIN SOD (PORK) LOCK FLUSH 100 UNIT/ML IV SOLN: 500.0000 [IU] | Freq: Once | INTRAVENOUS | Status: AC

## 2020-05-06 MED ORDER — IOHEXOL 300 MG/ML  SOLN
75.0000 mL | Freq: Once | INTRAMUSCULAR | Status: AC | PRN
  Administered 2020-05-06: 75 mL via INTRAVENOUS

## 2020-05-06 MED ORDER — SODIUM CHLORIDE (PF) 0.9 % IJ SOLN
INTRAMUSCULAR | Status: AC
  Filled 2020-05-06: qty 50

## 2020-05-06 MED ORDER — HEPARIN SOD (PORK) LOCK FLUSH 100 UNIT/ML IV SOLN
INTRAVENOUS | Status: AC
  Administered 2020-05-06: 500 [IU] via INTRAVENOUS
  Filled 2020-05-06: qty 5

## 2020-05-06 NOTE — Progress Notes (Signed)
[place] OFFICE PROGRESS NOTE  Redmond School, MD [address] [place] [address]  DIAGNOSIS: Stage IIIA (T3, N2, M0) non-small cell lung cancer, adenosquamous carcinoma diagnosed in June 2019 and presented with large right upper lobe lung mass with questionable chest wall invasion as well as right hilar and mediastinal lymphadenopathy.  Biomarker Findings Tumor Mutational Burden - TMB-High (21 Muts/Mb) Microsatellite status - MS-Stable Genomic Findings For a complete list of the genes assayed, please refer to the Appendix. ATM J0300* CCND2 P281R KRAS G12V CHEK2 T350f*15 TP53 E298* 7 Disease relevant genes with no reportable alterations: ALK, EGFR, BRAF, MET, RET, ERBB2, ROS1  PDL1 Expression: 99%  PRIOR THERAPY:  1) Concurrent chemoradiation with chemotherapy consisting of weekly carboplatin for an AUC of 2 and paclitaxel 45 mg/m2. First dose given on 04/03/2018.Status post 6 cycles. Last dose was giving 05/15/2018 with partial response. 2) Consolidation treatment with immunotherapy with Imfinzi (Durvalumab) 10 mg/KG every 2 weeks. First dose 06/20/2018. Status post 26 cycles.  CURRENT THERAPY: Systemic treatment with immunotherapy with Keytruda 200 mg IV every 3 weeks. First dose February 27, 2020.Status post 3 cycles.  INTERVAL HISTORY: Mark FERRIS59y.o. male returnsto the clinic for a follow up visit. The patient is feeling well today without any concerning complaints. The patient has been tolerating his treatment with immunotherapy well with out any adverse side effects.Denies any fever, chills, night sweats, or weight loss. Denies any chest pain or hemoptysis.He reports his baseline shortness of breath and cough but denies any chest pain or hemoptysis. The patient continues to smoke cigarettes which contributes to his respiratory complaints.He smokes a little over a half of a cigarettes per day.Denies any nausea,  vomiting,ordiarrhea.Denies any headache or visual changes. Denies any rashes or skin changes. He reports his baseline constipation for which he takes over-the-counter stool softener daily whichreportedly works well for him.He recently had a restaging CT scan performed. The patient is here today for evaluation and to review the scan prior to starting cycle #4    MEDICAL HISTORY: Past Medical History:  Diagnosis Date  . Acute hepatitis C virus infection 06/19/2007   Qualifier: Diagnosis of  By: MLeilani MerlCMA, Tiffany    . Acute ST elevation myocardial infarction (STEMI) involving left anterior descending (LAD) coronary artery ([facility] 05/26/2016  . Acute ST elevation myocardial infarction (STEMI) of lateral wall ([facility] 05/26/2016  . 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 Anxiety   . Asthma   . Atherosclerosis of native arteries of the extremities with intermittent claudication 12/16/2011  . Chronic hepatitis C without hepatic coma ([facility] 05/03/2014  . COPD with chronic bronchitis and emphysema ([facility] 03/13/2018   FEV1 57%  . Depression   . DVT (deep venous thrombosis) ([facility] 2009; 2019   RLE; LLE  . Encounter for antineoplastic chemotherapy 03/22/2018  . Hepatitis C    "tx'd in 2015"  . High cholesterol   . Ischemic cardiomyopathy 03/02/2018  . Non-small cell carcinoma of right lung, stage 3 ([facility] 03/22/2018  . PAD (peripheral artery disease) ([facility] 01/25/2013  . Peripheral vascular disease, unspecified 07/20/2012  . Port-A-Cath in place 04/24/2018  . ST elevation myocardial infarction involving left anterior descending (LAD) coronary artery ([facility]   . STEMI (ST elevation myocardial infarction) ([facility] 05/26/2016  . Tobacco abuse 01/28/2016  . Tubular adenoma of colon 07/11/2014  . Urinary dribbling     ALLERGIES:  is allergic to lyrica [pregabalin] and neurontin [gabapentin].  MEDICATIONS:  Current Outpatient Medications  Medication Sig  Dispense Refill  . alprazolam (XANAX) 2  MG tablet Take 2 mg by mouth 4 (four) times daily as needed for anxiety.     [name] amitriptyline (ELAVIL) 100 MG tablet Take 100 mg by mouth at bedtime.     [name] aspirin 81 MG EC tablet Take 81 mg by mouth daily.      [name] atorvastatin (LIPITOR) 80 MG tablet Take 1 tablet (80 mg total) by mouth daily at 6 PM. 30 tablet 12  . carvedilol (COREG) 6.25 MG tablet Take 1 tablet (6.25 mg total) by mouth 2 (two) times daily with a meal. OFFICE VISIT NEEDED 60 tablet 8  . citalopram (CELEXA) 40 MG tablet Take 40 mg by mouth daily.  11  . furosemide (LASIX) 20 MG tablet Take 1 tablet (20 mg total) by mouth daily. Please make overdue appt with Dr. Oval Linsey before anymore refills. 2nd attempt 15 tablet 0  . HYDROcodone-acetaminophen (NORCO) 10-325 MG tablet Take 1 tablet by mouth every 4 (four) hours as needed for moderate pain.   0  . levothyroxine (SYNTHROID) 125 MCG tablet TAKE 1 TABLET BY MOUTH EVERY DAY 30 tablet 2  . lidocaine-prilocaine (EMLA) cream Apply 1 application topically as needed. 30 g 0  . lisinopril (PRINIVIL,ZESTRIL) 2.5 MG tablet Take 1 tablet (2.5 mg total) by mouth daily. 30 tablet 12  . nitroGLYCERIN (NITROSTAT) 0.4 MG SL tablet Place 1 tablet (0.4 mg total) under the tongue every 5 (five) minutes x 3 doses as needed for chest pain. 25 tablet 2  . prochlorperazine (COMPAZINE) 10 MG tablet Take 1 tablet (10 mg total) by mouth every 6 (six) hours as needed for nausea or vomiting. 30 tablet 0  . sildenafil (VIAGRA) 25 MG tablet TAKE 1 TABLET BY MOUTH 60UMINUTES PRIOR TOEENCOUNTER.S    . spironolactone (ALDACTONE) 25 MG tablet TAKE 1 TABLET BY MOUTH EVERY DAY 90 tablet 0  . sucralfate (CARAFATE) 1 g tablet Take 1 tablet (1 g total) by mouth 4 (four) times daily -  with meals and at bedtime. 5 min before meals for radiation induced esophagitis 120 tablet 2  . tamsulosin (FLOMAX) 0.4 MG CAPS capsule Take 1 capsule by mouth daily.    . Tiotropium Bromide-Olodaterol (STIOLTO RESPIMAT) 2.5-2.5 MCG/ACT AERS  Inhale 2 puffs into the lungs daily. 1 Inhaler 5  . umeclidinium-vilanterol (ANORO ELLIPTA) 62.5-25 MCG/INH AEPB Inhale 1 puff into the lungs daily. 1 each 0   No current facility-administered medications for this visit.   Facility-Administered Medications Ordered in Other Visits  Medication Dose Route Frequency Provider Last Rate Last Admin  . sodium chloride flush (NS) 0.9 % injection 10 mL  10 mL Intracatheter PRN Curt Bears, MD   10 mL at 03/19/20 1544    SURGICAL HISTORY:  Past Surgical History:  Procedure Laterality Date  . AORTOGRAM  08/08/2009   for LLE claudication     By Dr. Oneida Alar  . 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 BIOPSY N/A 05/30/2014   Procedure: BIOPSY;  Surgeon: [name], MD;  Location: [facility];  Service: Endoscopy;  Laterality: N/A;  . BLADDER TUMOR EXCISION  8/812  . CARDIAC CATHETERIZATION N/A 05/26/2016   Procedure: Left Heart Cath and Coronary Angiography;  Surgeon: [name], MD;  Location: [facility];  Service: Cardiovascular;  Laterality: N/A;  . CARDIAC CATHETERIZATION N/A 05/26/2016   Procedure: Coronary Stent Intervention;  Surgeon: [name], MD;  Location: [facility];  Service: Cardiovascular;  Laterality:  N/A;  . COLONOSCOPY WITH PROPOFOL N/A 05/30/2014   EXB:MWUXLK colonic polyp-likely source of hematochezia-removed as described above  . ESOPHAGOGASTRODUODENOSCOPY (EGD) WITH PROPOFOL N/A 05/30/2014   GMW:NUUVOZ and bulbar erosions s/p gastric biopsy. No evidence of portal gastropathy on today's examination.  . FEMORAL-TIBIAL BYPASS GRAFT  2009   Right side using non-reversed GSV   By Dr. Oneida Alar  . FEMORAL-TIBIAL BYPASS GRAFT  11/24/2007   Right femoral to anterior tibial BPG   by Dr. Oneida Alar  . FINGER SURGERY Left    "straightened my pinky"  . HAND TENDON SURGERY Left 2013   Left 5th finger  . HERNIA REPAIR     "stomach"  . ICD IMPLANT N/A 03/02/2018   Procedure: ICD IMPLANT;  Surgeon: [name],  MD;  Location: [facility];  Service: Cardiovascular;  Laterality: N/A;  . INCISIONAL HERNIA REPAIR N/A 12/25/2014   Procedure: HERNIA REPAIR INCISIONAL WITH MESH;  Surgeon: [name], MD;  Location: [facility];  Service: General;  Laterality: N/A;  . INSERTION OF MESH N/A 12/25/2014   Procedure: INSERTION OF MESH;  Surgeon: [name], MD;  Location: [facility];  Service: General;  Laterality: N/A;  . IR IMAGING GUIDED PORT INSERTION  04/11/2018  . LAPAROSCOPIC CHOLECYSTECTOMY    . LOWER EXTREMITY ANGIOGRAM Left 12/30/2015   Procedure: Lower Extremity Angiogram;  Surgeon: [name], MD;  Location: [facility];  Service: Cardiovascular;  Laterality: Left;  . PERIPHERAL VASCULAR CATHETERIZATION N/A 12/30/2015   Procedure: Abdominal Aortogram;  Surgeon: [name], MD;  Location: [facility];  Service: Cardiovascular;  Laterality: N/A;  . POLYPECTOMY  05/30/2014   Procedure: POLYPECTOMY;  Surgeon: [name], MD;  Location: [facility];  Service: Endoscopy;;  . TONSILLECTOMY AND ADENOIDECTOMY  ~ 1970  . TYMPANOSTOMY TUBE PLACEMENT Bilateral ~ 1970    REVIEW OF SYSTEMS:   Review of Systems Constitutional: Negative for appetite change, chills, fatigue, fever and unexpected weight change.  HENT: Negative for mouth sores, nosebleeds, sore throat and trouble swallowing.  Eyes: Negative for eye problems and icterus.  Respiratory:Positive for baseline cough and shortness of breath.Negative for hemoptysis and wheezing.  Cardiovascular: Negative for chest pain and leg swelling.  Gastrointestinal: Negative for abdominal pain, constipation, diarrhea, nausea and vomiting.  Genitourinary: Negative for bladder incontinence, difficulty urinating, dysuria, frequency and hematuria.  Musculoskeletal: Negative for back pain, gait problem, neck pain and neck stiffness.  Skin: Negative for itching and rash.  Neurological: Negative for dizziness, extremity weakness, gait problem,  headaches, light-headedness and seizures.  Hematological: Negative for adenopathy. Does not bruise/bleed easily.  Psychiatric/Behavioral: Negative for confusion, depression and sleep disturbance. The patient is not nervous/anxious.    PHYSICAL EXAMINATION:  Blood pressure 118/84, pulse 75, temperature (!) 96.9 F (36.1 C), temperature source Tympanic, resp. rate 17, height '6\' 1"'$  (1.854 m), weight 233 lb 4.8 oz (105.8 kg), SpO2 99 %.  ECOG PERFORMANCE STATUS: 1 - Symptomatic but completely ambulatory  Physical Exam  Constitutional: Oriented to person, place, and time and well-developed, well-nourished, and in no distress.  HENT:  Head: Normocephalic and atraumatic.  Mouth/Throat: Oropharynx is clear and moist. No oropharyngeal exudate.  Eyes: Conjunctivae are normal. Right eye exhibits no discharge. Left eye exhibits no discharge. No scleral icterus.  Neck: Normal range of motion. Neck supple.  Cardiovascular: Normal rate, regular rhythm, normal heart sounds and intact distal pulses.  Pulmonary/Chest:Effort normal and breath sounds normal. No respiratory distress. No rales.  Abdominal:  Soft. Bowel sounds are normal. Exhibits no distension and no mass. There is no tenderness.  Musculoskeletal: BKA noted on the right leg.Normal range of motion. Exhibits no edema.  Lymphadenopathy:  No cervical adenopathy.  Neurological: Alert and oriented to person, place, and time. Exhibits normal muscle tone. Gait normal. Coordination normal.  Skin: Skin is warm and dry. No rash noted. Not diaphoretic. No erythema. No pallor.  Psychiatric: Mood, memory and judgment normal.  Vitals reviewed.  LABORATORY DATA: Lab Results  Component Value Date   WBC 9.3 05/08/2020   HGB 13.9 05/08/2020   HCT 42.3 05/08/2020   MCV 96.6 05/08/2020   PLT 252 05/08/2020      Chemistry      Component Value Date/Time   NA 134 (L) 05/08/2020 1342   NA 139 02/27/2018 1457   K 4.4 05/08/2020 1342   CL 98  05/08/2020 1342   CO2 27 05/08/2020 1342   BUN 9 05/08/2020 1342   BUN 7 02/27/2018 1457   CREATININE 1.17 05/08/2020 1342   CREATININE 1.13 06/28/2016 1453      Component Value Date/Time   CALCIUM 9.5 05/08/2020 1342   ALKPHOS 126 05/08/2020 1342   AST 21 05/08/2020 1342   ALT 29 05/08/2020 1342   BILITOT 0.5 05/08/2020 1342       RADIOGRAPHIC STUDIES:  CT Chest W Contrast  Result Date: 05/07/2020 CLINICAL DATA:  Metastatic small cell lung cancer, treatment response, ongoing immunotherapy EXAM: CT CHEST WITH CONTRAST TECHNIQUE: Multidetector CT imaging of the chest was performed during intravenous contrast administration. CONTRAST:  80m OMNIPAQUE IOHEXOL 300 MG/ML  SOLN COMPARISON:  PET-CT, 02/07/2001, CT chest, 12/29/2019 FINDINGS: Cardiovascular: Right chest port catheter. Left chest single lead pacer defibrillator. Normal heart size. Coronary artery calcifications and/or stents. No pericardial effusion. Mediastinum/Nodes: No enlarged mediastinal, hilar, or axillary lymph nodes. Thyroid gland, trachea, and esophagus demonstrate no significant findings. Lungs/Pleura: Diffuse bilateral bronchial wall thickening. Minimal emphysema. No significant interval change in dense post treatment consolidation of the right pulmonary apex, most discretely measurable masslike area corresponding to region of hypermetabolism seen on prior PET-CT, measuring approximately 6.6 x 6.0 cm (series 5, image 43). A hypermetabolic nodule of the medial right upper lobe seen on prior PET-CT may be minimally enlarged compared to prior examination, measuring approximately 0.8 x 0.7 cm, although has clearly enlarged compared to more remote prior examinations (series 5, image 63). Additional scattered small nodules of the right upper lobe are unchanged (series 5, image 58). Unchanged ground-glass nodule of the peripheral left upper lobe measuring 6 mm (series 5, image 49). No pleural effusion or pneumothorax. Upper Abdomen:  No acute abnormality. Musculoskeletal: No chest wall mass or suspicious bone lesions identified. IMPRESSION: 1. No significant interval change in dense post treatment consolidation of the right pulmonary apex, most discretely measurable masslike area corresponding to region of hypermetabolism seen on prior PET-CT, which generally remains concerning for local recurrence. 2. A hypermetabolic nodule of the medial right upper lobe seen on prior PET-CT may be minimally enlarged compared to prior examination, measuring approximately 0.8 x 0.7 cm, although has clearly enlarged compared to more remote prior examinations. This is concerning for very slow disease for progression 3. Attention on follow-up. 4. Additional scattered small nodules of the right upper lobe are unchanged. Unchanged ground-glass nodule of the peripheral left upper lobe measuring 6 mm. Attention on follow-up. 5. Diffuse bilateral bronchial wall thickening, consistent with nonspecific infectious or inflammatory bronchitis. 6. Emphysema (ICD10-J43.9). 7. Coronary artery disease. Electronically  Signed   By: Eddie Candle M.D.   On: 05/07/2020 08:41     ASSESSMENT/PLAN:  This is a very pleasant 59 year old Caucasian male with stage IIIa non-small cell lung cancer, adenosquamous carcinoma. He presented with a large right upper lobe lung mass with questionable chest wall invasion as well as a right hilar and mediastinal lymphadenopathy. He was diagnosed in June 2019.His PDL1 expression is 99%  He completed 6 cycles of concurrent chemoradiation with carboplatin and paclitaxel. He had a partial response to treatment.  The patient is currently undergoing consolidation immunotherapy with Imfinzi 10 mg/kg IV every 2 weeks. He is status post26cycles. He completed this on10/03/2019  He was on observation until he showed evidence of local recurrence.   He is currently undergoing systemic chemotherapy with Keytruda 200 mg IV every 3 weeks. He  is status post 3 cycles.He tolerated this well without any concerning complains.  The patient recently had a restaging CT scan. Dr. Julien Nordmann personally and independently reviewed the scan and discussed the results with the patient. The scan did not show any evidence of disease progression. Recommend that he continue on the same treatment at the same dose.    Recommend that he proceed with cycle #4 today as scheduled.     He will continue to use his stool softener for constipation.   The patient knows that we strongly encourage smoking cessation. He is not interested in quitting at this time.  We will monitor his TSH closely and make adjustments as necessary. TSH pending at this time.  The patient was advised to call immediately if he has any concerning symptoms in the interval. The patient voices understanding of current disease status and treatment options and is in agreement with the current care plan. All questions were answered. The patient knows to call the clinic with any problems, questions or concerns. We can certainly see the patient much sooner if necessary  No orders of the defined types were placed in this encounter.    Shadai Mcclane L Myia Bergh, PA-C 05/08/20  ADDENDUM: Hematology/Oncology Attending: I had a face-to-face encounter with the patient today.  I recommended his care plan.  This is a very pleasant 59 years old white male with recurrent non-small cell lung cancer, adenocarcinoma that was initially diagnosed as stage IIIa status post a course of concurrent chemoradiation followed by consolidation treatment with immunotherapy. The patient was followed by observation and staging work-up.  He was found to have evidence for disease recurrence.  He started systemic chemotherapy with Keytruda 200 mg IV every 3 weeks status post 3 cycles.  The patient has been tolerating this treatment well with no concerning adverse effects. He had repeat CT scan of the chest performed  recently.  I personally and independently reviewed the scan images and discussed the results with the patient and his wife. His scan showed no concerning findings for disease progression. I recommended for the patient to continue his current treatment with Keytruda every 3 weeks. He will proceed with cycle #4 today. For the constipation he will continue with a stool softener. We will continue to monitor his TSH closely for any endocrine adverse effect of the immunotherapy. The patient was also encouraged to quit smoking. The patient was advised to call immediately if he has any concerning symptoms in the interval.    Disclaimer: This note was dictated with voice recognition software. Similar sounding words can inadvertently be transcribed and may be missed upon review. Eilleen Kempf, MD 05/08/20

## 2020-05-08 ENCOUNTER — Inpatient Hospital Stay: Payer: Medicare Other

## 2020-05-08 ENCOUNTER — Encounter: Payer: Self-pay | Admitting: Physician Assistant

## 2020-05-08 ENCOUNTER — Other Ambulatory Visit: Payer: Self-pay

## 2020-05-08 ENCOUNTER — Inpatient Hospital Stay: Payer: Medicare Other | Attending: Internal Medicine | Admitting: Physician Assistant

## 2020-05-08 VITALS — BP 118/84 | HR 75 | Temp 96.9°F | Resp 17 | Ht 73.0 in | Wt 233.3 lb

## 2020-05-08 DIAGNOSIS — Z9221 Personal history of antineoplastic chemotherapy: Secondary | ICD-10-CM | POA: Insufficient documentation

## 2020-05-08 DIAGNOSIS — I2510 Atherosclerotic heart disease of native coronary artery without angina pectoris: Secondary | ICD-10-CM | POA: Diagnosis not present

## 2020-05-08 DIAGNOSIS — R7989 Other specified abnormal findings of blood chemistry: Secondary | ICD-10-CM

## 2020-05-08 DIAGNOSIS — Z95810 Presence of automatic (implantable) cardiac defibrillator: Secondary | ICD-10-CM | POA: Diagnosis not present

## 2020-05-08 DIAGNOSIS — C3491 Malignant neoplasm of unspecified part of right bronchus or lung: Secondary | ICD-10-CM

## 2020-05-08 DIAGNOSIS — I252 Old myocardial infarction: Secondary | ICD-10-CM | POA: Insufficient documentation

## 2020-05-08 DIAGNOSIS — C3411 Malignant neoplasm of upper lobe, right bronchus or lung: Secondary | ICD-10-CM | POA: Insufficient documentation

## 2020-05-08 DIAGNOSIS — Z923 Personal history of irradiation: Secondary | ICD-10-CM | POA: Diagnosis not present

## 2020-05-08 DIAGNOSIS — Z86718 Personal history of other venous thrombosis and embolism: Secondary | ICD-10-CM | POA: Diagnosis not present

## 2020-05-08 DIAGNOSIS — Z79899 Other long term (current) drug therapy: Secondary | ICD-10-CM | POA: Insufficient documentation

## 2020-05-08 DIAGNOSIS — Z95828 Presence of other vascular implants and grafts: Secondary | ICD-10-CM

## 2020-05-08 DIAGNOSIS — K5900 Constipation, unspecified: Secondary | ICD-10-CM | POA: Insufficient documentation

## 2020-05-08 DIAGNOSIS — F419 Anxiety disorder, unspecified: Secondary | ICD-10-CM | POA: Insufficient documentation

## 2020-05-08 DIAGNOSIS — Z7982 Long term (current) use of aspirin: Secondary | ICD-10-CM | POA: Diagnosis not present

## 2020-05-08 DIAGNOSIS — J449 Chronic obstructive pulmonary disease, unspecified: Secondary | ICD-10-CM | POA: Insufficient documentation

## 2020-05-08 DIAGNOSIS — F329 Major depressive disorder, single episode, unspecified: Secondary | ICD-10-CM | POA: Insufficient documentation

## 2020-05-08 DIAGNOSIS — F17210 Nicotine dependence, cigarettes, uncomplicated: Secondary | ICD-10-CM | POA: Insufficient documentation

## 2020-05-08 DIAGNOSIS — Z5112 Encounter for antineoplastic immunotherapy: Secondary | ICD-10-CM | POA: Diagnosis not present

## 2020-05-08 DIAGNOSIS — B182 Chronic viral hepatitis C: Secondary | ICD-10-CM | POA: Insufficient documentation

## 2020-05-08 DIAGNOSIS — E7800 Pure hypercholesterolemia, unspecified: Secondary | ICD-10-CM | POA: Diagnosis not present

## 2020-05-08 LAB — CMP (CANCER CENTER ONLY)
ALT: 29 U/L (ref 0–44)
AST: 21 U/L (ref 15–41)
Albumin: 4 g/dL (ref 3.5–5.0)
Alkaline Phosphatase: 126 U/L (ref 38–126)
Anion gap: 9 (ref 5–15)
BUN: 9 mg/dL (ref 6–20)
CO2: 27 mmol/L (ref 22–32)
Calcium: 9.5 mg/dL (ref 8.9–10.3)
Chloride: 98 mmol/L (ref 98–111)
Creatinine: 1.17 mg/dL (ref 0.61–1.24)
GFR, Est AFR Am: >60 mL/min (ref >60)
GFR, Estimated: >60 mL/min (ref >60)
Glucose, Bld: 109 mg/dL — ABNORMAL HIGH (ref 70–99)
Potassium: 4.4 mmol/L (ref 3.5–5.1)
Sodium: 134 mmol/L — ABNORMAL LOW (ref 135–145)
Total Bilirubin: 0.5 mg/dL (ref 0.3–1.2)
Total Protein: 7.3 g/dL (ref 6.5–8.1)

## 2020-05-08 LAB — CBC WITH DIFFERENTIAL (CANCER CENTER ONLY)
Abs Immature Granulocytes: 0.1 10*3/uL — ABNORMAL HIGH (ref 0.00–0.07)
Basophils Absolute: 0.1 10*3/uL (ref 0.0–0.1)
Basophils Relative: 1 %
Eosinophils Absolute: 0.5 10*3/uL (ref 0.0–0.5)
Eosinophils Relative: 5 %
HCT: 42.3 % (ref 39.0–52.0)
Hemoglobin: 13.9 g/dL (ref 13.0–17.0)
Immature Granulocytes: 1 %
Lymphocytes Relative: 9 %
Lymphs Abs: 0.8 10*3/uL (ref 0.7–4.0)
MCH: 31.7 pg (ref 26.0–34.0)
MCHC: 32.9 g/dL (ref 30.0–36.0)
MCV: 96.6 fL (ref 80.0–100.0)
Monocytes Absolute: 0.6 10*3/uL (ref 0.1–1.0)
Monocytes Relative: 7 %
Neutro Abs: 7.1 10*3/uL (ref 1.7–7.7)
Neutrophils Relative %: 77 %
Platelet Count: 252 10*3/uL (ref 150–400)
RBC: 4.38 MIL/uL (ref 4.22–5.81)
RDW: 14.9 % (ref 11.5–15.5)
WBC Count: 9.3 10*3/uL (ref 4.0–10.5)
nRBC: 0 % (ref 0.0–0.2)

## 2020-05-08 LAB — TSH: TSH: 34.873 u[IU]/mL — ABNORMAL HIGH (ref 0.320–4.118)

## 2020-05-08 MED ORDER — SODIUM CHLORIDE 0.9% FLUSH
10.0000 mL | INTRAVENOUS | Status: DC | PRN
  Administered 2020-05-08: 10 mL
  Filled 2020-05-08: qty 10

## 2020-05-08 MED ORDER — HEPARIN SOD (PORK) LOCK FLUSH 100 UNIT/ML IV SOLN
500.0000 [IU] | Freq: Once | INTRAVENOUS | Status: AC | PRN
  Administered 2020-05-08: 500 [IU]
  Filled 2020-05-08: qty 5

## 2020-05-08 MED ORDER — SODIUM CHLORIDE 0.9 % IV SOLN
200.0000 mg | Freq: Once | INTRAVENOUS | Status: AC
  Administered 2020-05-08: 200 mg via INTRAVENOUS
  Filled 2020-05-08: qty 8

## 2020-05-08 MED ORDER — LEVOTHYROXINE SODIUM 125 MCG PO TABS: ORAL_TABLET | ORAL | 2 refills | Status: DC

## 2020-05-08 MED ORDER — SODIUM CHLORIDE 0.9% FLUSH
10.0000 mL | Freq: Once | INTRAVENOUS | Status: AC
  Administered 2020-05-08: 10 mL
  Filled 2020-05-08: qty 10

## 2020-05-08 MED ORDER — SODIUM CHLORIDE 0.9 % IV SOLN
Freq: Once | INTRAVENOUS | Status: AC
  Filled 2020-05-08: qty 250

## 2020-05-08 NOTE — Patient Instructions (Signed)
[facility] Discharge Instructions for Patients Receiving Chemotherapy  Today you received the following chemotherapy agents:  Keytruda.  To help prevent nausea and vomiting after your treatment, we encourage you to take your nausea medication as directed.   If you develop nausea and vomiting that is not controlled by your nausea medication, call the clinic.   BELOW ARE SYMPTOMS THAT SHOULD BE REPORTED IMMEDIATELY:  *FEVER GREATER THAN 100.5 F  *CHILLS WITH OR WITHOUT FEVER  NAUSEA AND VOMITING THAT IS NOT CONTROLLED WITH YOUR NAUSEA MEDICATION  *UNUSUAL SHORTNESS OF BREATH  *UNUSUAL BRUISING OR BLEEDING  TENDERNESS IN MOUTH AND THROAT WITH OR WITHOUT PRESENCE OF ULCERS  *URINARY PROBLEMS  *BOWEL PROBLEMS  UNUSUAL RASH Items with * indicate a potential emergency and should be followed up as soon as possible.  Feel free to call the clinic should you have any questions or concerns. The clinic phone number is [phone].  Please show the CHEMO ALERT CARD at check-in to the Emergency Department and triage nurse.    

## 2020-05-08 NOTE — Patient Instructions (Signed)

## 2020-05-13 ENCOUNTER — Other Ambulatory Visit: Payer: Self-pay | Admitting: Cardiovascular Disease

## 2020-05-24 NOTE — Progress Notes (Signed)
[place] OFFICE PROGRESS NOTE  Mark School, MD [address] [place] [address]  DIAGNOSIS: Stage IIIA (T3, N2, M0) non-small cell lung cancer, adenosquamous carcinoma diagnosed in June 2019 and presented with large right upper lobe lung mass with questionable chest wall invasion as well as right hilar and mediastinal lymphadenopathy.  Biomarker Findings Tumor Mutational Burden - TMB-High (21 Muts/Mb) Microsatellite status - MS-Stable Genomic Findings For a complete list of the genes assayed, please refer to the Appendix. ATM J2426* CCND2 P281R KRAS G12V CHEK2 T322f*15 TP53 E298* 7 Disease relevant genes with no reportable alterations: ALK, EGFR, BRAF, MET, RET, ERBB2, ROS1  PDL1 Expression: 99%  PRIOR THERAPY: 1) Concurrent chemoradiation with chemotherapy consisting of weekly carboplatin for an AUC of 2 and paclitaxel 45 mg/m2. First dose given on 04/03/2018.Status post 6 cycles. Last dose was giving 05/15/2018 with partial response. 2) Consolidation treatment with immunotherapy with Imfinzi (Durvalumab) 10 mg/KG every 2 weeks. First dose 06/20/2018. Status post 26 cycles.  CURRENT THERAPY: Systemic treatment with immunotherapy with Keytruda 200 mg IV every 3 weeks. First dose February 27, 2020.Status post4cycles.  INTERVAL HISTORY: Mark [age]ASS89y.o. male returns to the clinic for a follow up visit. The patient is feeling well today without any concerning complaints. The patienthas been tolerating his treatment with immunotherapy well with out any adverse side effects.Denies any fever, chills, night sweats, or weight loss. Denies any chest pain or hemoptysis.He reports his baseline shortness of breath and cough but denies any chest pain or hemoptysis. He has wheezing at night and uses an inhaler.The patient continues to smoke cigarettes which contributes to his respiratory complaints.He smokes a little overa halfofacigarettes per  day.Denies any nausea, vomiting,ordiarrhea.Denies any headache or visual changes. Denies any rashes or skin changes. He reports his baseline constipation for which he takes over-the-counter stool softener daily whichreportedly works well for him. The patient is here today for evaluation and to review the scan prior to starting cycle #5   MEDICAL HISTORY: Past Medical History:  Diagnosis Date  . Acute hepatitis C virus infection 06/19/2007   Qualifier: Diagnosis of  By: [name], [name]    . Acute ST elevation myocardial infarction (STEMI) involving left anterior descending (LAD) coronary artery ([facility] 05/26/2016  . Acute ST elevation myocardial infarction (STEMI) of lateral wall ([facility] 05/26/2016  . 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 Anxiety   . Asthma   . Atherosclerosis of native arteries of the extremities with intermittent claudication 12/16/2011  . Chronic hepatitis C without hepatic coma ([facility] 05/03/2014  . COPD with chronic bronchitis and emphysema ([facility] 03/13/2018   FEV1 57%  . Depression   . DVT (deep venous thrombosis) ([facility] 2009; 2019   RLE; LLE  . Encounter for antineoplastic chemotherapy 03/22/2018  . Hepatitis C    "tx'd in 2015"  . High cholesterol   . Ischemic cardiomyopathy 03/02/2018  . Non-small cell carcinoma of right lung, stage 3 ([facility] 03/22/2018  . PAD (peripheral artery disease) ([facility] 01/25/2013  . Peripheral vascular disease, unspecified 07/20/2012  . Port-A-Cath in place 04/24/2018  . ST elevation myocardial infarction involving left anterior descending (LAD) coronary artery ([facility]   . STEMI (ST elevation myocardial infarction) ([facility] 05/26/2016  . Tobacco abuse 01/28/2016  . Tubular adenoma of colon 07/11/2014  . Urinary dribbling     ALLERGIES:  is allergic to lyrica [pregabalin] and neurontin [gabapentin].  MEDICATIONS:  Current Outpatient Medications  Medication Sig Dispense Refill  . alprazolam ([name] 2  MG tablet Take 2 mg by  mouth 4 (four) times daily as needed for anxiety.     [name] amitriptyline (ELAVIL) 100 MG tablet Take 100 mg by mouth at bedtime.     [name] aspirin 81 MG EC tablet Take 81 mg by mouth daily.      [name] atorvastatin (LIPITOR) 80 MG tablet Take 1 tablet (80 mg total) by mouth daily at 6 PM. 30 tablet 12  . carvedilol (COREG) 6.25 MG tablet TAKE 1 TABLET (6.25 MG TOTAL) BY MOUTH 2 (TWO) TIMES DAILY WITH A MEAL. OFFICE VISIT NEEDED 60 tablet 1  . citalopram (CELEXA) 40 MG tablet Take 40 mg by mouth daily.  11  . furosemide (LASIX) 20 MG tablet Take 1 tablet (20 mg total) by mouth daily. Please make overdue appt with [name] before anymore refills. 2nd attempt 15 tablet 0  . HYDROcodone-acetaminophen (NORCO) 10-325 MG tablet Take 1 tablet by mouth every 4 (four) hours as needed for moderate pain.   0  . levothyroxine (SYNTHROID) 125 MCG tablet TAKE 1 TABLET BY MOUTH EVERY DAY 30 tablet 2  . lidocaine-prilocaine (EMLA) cream Apply 1 application topically as needed. 30 g 0  . lisinopril (PRINIVIL,ZESTRIL) 2.5 MG tablet Take 1 tablet (2.5 mg total) by mouth daily. 30 tablet 12  . nitroGLYCERIN (NITROSTAT) 0.4 MG SL tablet Place 1 tablet (0.4 mg total) under the tongue every 5 (five) minutes x 3 doses as needed for chest pain. 25 tablet 2  . prochlorperazine (COMPAZINE) 10 MG tablet Take 1 tablet (10 mg total) by mouth every 6 (six) hours as needed for nausea or vomiting. 30 tablet 0  . sildenafil (VIAGRA) 25 MG tablet TAKE 1 TABLET BY MOUTH 60UMINUTES PRIOR TOEENCOUNTER.S    . spironolactone (ALDACTONE) 25 MG tablet TAKE 1 TABLET BY MOUTH EVERY DAY 90 tablet 0  . sucralfate (CARAFATE) 1 g tablet Take 1 tablet (1 g total) by mouth 4 (four) times daily -  with meals and at bedtime. 5 min before meals for radiation induced esophagitis 120 tablet 2  . tamsulosin (FLOMAX) 0.4 MG CAPS capsule Take 1 capsule by mouth daily.    . Tiotropium Bromide-Olodaterol (STIOLTO RESPIMAT) 2.5-2.5 MCG/ACT AERS Inhale 2 puffs into the  lungs daily. 1 Inhaler 5  . umeclidinium-vilanterol (ANORO ELLIPTA) 62.5-25 MCG/INH AEPB Inhale 1 puff into the lungs daily. 1 each 0   No current facility-administered medications for this visit.   Facility-Administered Medications Ordered in Other Visits  Medication Dose Route Frequency Provider Last Rate Last Admin  . sodium chloride flush (NS) 0.9 % injection 10 mL  10 mL Intracatheter PRN Curt Bears, MD   10 mL at 03/19/20 1544    SURGICAL HISTORY:  Past Surgical History:  Procedure Laterality Date  . AORTOGRAM  08/08/2009   for LLE claudication     By Dr. Oneida Alar  . 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 BIOPSY N/A 05/30/2014   Procedure: BIOPSY;  Surgeon: [name], MD;  Location: [facility];  Service: Endoscopy;  Laterality: N/A;  . BLADDER TUMOR EXCISION  8/812  . CARDIAC CATHETERIZATION N/A 05/26/2016   Procedure: Left Heart Cath and Coronary Angiography;  Surgeon: [name], MD;  Location: [facility];  Service: Cardiovascular;  Laterality: N/A;  . CARDIAC CATHETERIZATION N/A 05/26/2016   Procedure: Coronary Stent Intervention;  Surgeon: [name], MD;  Location: [facility];  Service: Cardiovascular;  Laterality: N/A;  . COLONOSCOPY WITH PROPOFOL N/A  05/30/2014   DDU:KGURKY colonic polyp-likely source of hematochezia-removed as described above  . ESOPHAGOGASTRODUODENOSCOPY (EGD) WITH PROPOFOL N/A 05/30/2014   HCW:CBJSEG and bulbar erosions s/p gastric biopsy. No evidence of portal gastropathy on today's examination.  . FEMORAL-TIBIAL BYPASS GRAFT  2009   Right side using non-reversed GSV   By Dr. Oneida Alar  . FEMORAL-TIBIAL BYPASS GRAFT  11/24/2007   Right femoral to anterior tibial BPG   by Dr. Oneida Alar  . FINGER SURGERY Left    "straightened my pinky"  . HAND TENDON SURGERY Left 2013   Left 5th finger  . HERNIA REPAIR     "stomach"  . ICD IMPLANT N/A 03/02/2018   Procedure: ICD IMPLANT;  Surgeon: [name], MD;  Location: [facility];  Service: Cardiovascular;  Laterality: N/A;  . INCISIONAL HERNIA REPAIR N/A 12/25/2014   Procedure: HERNIA REPAIR INCISIONAL WITH MESH;  Surgeon: [name], MD;  Location: [facility];  Service: General;  Laterality: N/A;  . INSERTION OF MESH N/A 12/25/2014   Procedure: INSERTION OF MESH;  Surgeon: [name], MD;  Location: [facility];  Service: General;  Laterality: N/A;  . IR IMAGING GUIDED PORT INSERTION  04/11/2018  . LAPAROSCOPIC CHOLECYSTECTOMY    . LOWER EXTREMITY ANGIOGRAM Left 12/30/2015   Procedure: Lower Extremity Angiogram;  Surgeon: [name], MD;  Location: [facility];  Service: Cardiovascular;  Laterality: Left;  . PERIPHERAL VASCULAR CATHETERIZATION N/A 12/30/2015   Procedure: Abdominal Aortogram;  Surgeon: [name], MD;  Location: [facility];  Service: Cardiovascular;  Laterality: N/A;  . POLYPECTOMY  05/30/2014   Procedure: POLYPECTOMY;  Surgeon: [name], MD;  Location: [facility];  Service: Endoscopy;;  . TONSILLECTOMY AND ADENOIDECTOMY  ~ 1970  . TYMPANOSTOMY TUBE PLACEMENT Bilateral ~ 1970    REVIEW OF SYSTEMS:   Constitutional: Negative for appetite change, chills, fatigue, fever and unexpected weight change.  HENT: Negative for mouth sores, nosebleeds, sore throat and trouble swallowing.  Eyes: Negative for eye problems and icterus.  Respiratory:Positive for baseline cough and shortness of breath.Negative for hemoptysis and wheezing.  Cardiovascular: Negative for chest pain and leg swelling.  Gastrointestinal: Negative for abdominal pain, constipation, diarrhea, nausea and vomiting.  Genitourinary: Negative for bladder incontinence, difficulty urinating, dysuria, frequency and hematuria.  Musculoskeletal: Negative for back pain, gait problem, neck pain and neck stiffness.  Skin: Negative for itching and rash.  Neurological: Negative for dizziness, extremity weakness, gait problem, headaches, light-headedness and  seizures.  Hematological: Negative for adenopathy. Does not bruise/bleed easily.  Psychiatric/Behavioral: Negative for confusion, depression and sleep disturbance. The patient is not nervous/anxious.    PHYSICAL EXAMINATION:  There were no vitals taken for this visit.  ECOG PERFORMANCE STATUS: 1 - Symptomatic but completely ambulatory  Physical Exam  Constitutional: Oriented to person, place, and time and well-developed, well-nourished, and in no distress.  HENT:  Head: Normocephalic and atraumatic.  Mouth/Throat: Oropharynx is clear and moist. No oropharyngeal exudate.  Eyes: Conjunctivae are normal. Right eye exhibits no discharge. Left eye exhibits no discharge. No scleral icterus.  Neck: Normal range of motion. Neck supple.  Cardiovascular: Normal rate, regular rhythm, normal heart sounds and intact distal pulses.  Pulmonary/Chest:Effort normal and breath sounds normal. No respiratory distress. No rales.  Abdominal: Soft. Bowel sounds are normal. Exhibits no distension and no mass. There is no tenderness.  Musculoskeletal: BKA noted on the right leg.Normal range of motion. Exhibits no edema.  Lymphadenopathy:  No cervical  adenopathy.  Neurological: Alert and oriented to person, place, and time. Exhibits normal muscle tone. Gait normal. Coordination normal.  Skin: Skin is warm and dry. No rash noted. Not diaphoretic. No erythema. No pallor.  Psychiatric: Mood, memory and judgment normal.  Vitals reviewed.  LABORATORY DATA: Lab Results  Component Value Date   WBC 9.3 05/08/2020   HGB 13.9 05/08/2020   HCT 42.3 05/08/2020   MCV 96.6 05/08/2020   PLT 252 05/08/2020      Chemistry      Component Value Date/Time   NA 134 (L) 05/08/2020 1342   NA 139 02/27/2018 1457   K 4.4 05/08/2020 1342   CL 98 05/08/2020 1342   CO2 27 05/08/2020 1342   BUN 9 05/08/2020 1342   BUN 7 02/27/2018 1457   CREATININE 1.17 05/08/2020 1342   CREATININE 1.13 06/28/2016 1453       Component Value Date/Time   CALCIUM 9.5 05/08/2020 1342   ALKPHOS 126 05/08/2020 1342   AST 21 05/08/2020 1342   ALT 29 05/08/2020 1342   BILITOT 0.5 05/08/2020 1342       RADIOGRAPHIC STUDIES:  CT Chest W Contrast  Result Date: 05/07/2020 CLINICAL DATA:  Metastatic small cell lung cancer, treatment response, ongoing immunotherapy EXAM: CT CHEST WITH CONTRAST TECHNIQUE: Multidetector CT imaging of the chest was performed during intravenous contrast administration. CONTRAST:  52m OMNIPAQUE IOHEXOL 300 MG/ML  SOLN COMPARISON:  PET-CT, 02/07/2001, CT chest, 12/29/2019 FINDINGS: Cardiovascular: Right chest port catheter. Left chest single lead pacer defibrillator. Normal heart size. Coronary artery calcifications and/or stents. No pericardial effusion. Mediastinum/Nodes: No enlarged mediastinal, hilar, or axillary lymph nodes. Thyroid gland, trachea, and esophagus demonstrate no significant findings. Lungs/Pleura: Diffuse bilateral bronchial wall thickening. Minimal emphysema. No significant interval change in dense post treatment consolidation of the right pulmonary apex, most discretely measurable masslike area corresponding to region of hypermetabolism seen on prior PET-CT, measuring approximately 6.6 x 6.0 cm (series 5, image 43). A hypermetabolic nodule of the medial right upper lobe seen on prior PET-CT may be minimally enlarged compared to prior examination, measuring approximately 0.8 x 0.7 cm, although has clearly enlarged compared to more remote prior examinations (series 5, image 63). Additional scattered small nodules of the right upper lobe are unchanged (series 5, image 58). Unchanged ground-glass nodule of the peripheral left upper lobe measuring 6 mm (series 5, image 49). No pleural effusion or pneumothorax. Upper Abdomen: No acute abnormality. Musculoskeletal: No chest wall mass or suspicious bone lesions identified. IMPRESSION: 1. No significant interval change in dense post treatment  consolidation of the right pulmonary apex, most discretely measurable masslike area corresponding to region of hypermetabolism seen on prior PET-CT, which generally remains concerning for local recurrence. 2. A hypermetabolic nodule of the medial right upper lobe seen on prior PET-CT may be minimally enlarged compared to prior examination, measuring approximately 0.8 x 0.7 cm, although has clearly enlarged compared to more remote prior examinations. This is concerning for very slow disease for progression 3. Attention on follow-up. 4. Additional scattered small nodules of the right upper lobe are unchanged. Unchanged ground-glass nodule of the peripheral left upper lobe measuring 6 mm. Attention on follow-up. 5. Diffuse bilateral bronchial wall thickening, consistent with nonspecific infectious or inflammatory bronchitis. 6. Emphysema (ICD10-J43.9). 7. Coronary artery disease. Electronically Signed   By: AEddie CandleM.D.   On: 05/07/2020 08:41     ASSESSMENT/PLAN:  This is a very pleasant 59year old Caucasian male with stage IIIa non-small cell lung cancer,  adenosquamous carcinoma. He presented with a large right upper lobe lung mass with questionable chest wall invasion as well as a right hilar and mediastinal lymphadenopathy. He was diagnosed in June 2019.His PDL1 expression is 99%  He completed 6 cycles of concurrent chemoradiation with carboplatin and paclitaxel. He had a partial response to treatment.  The patient is currently undergoing consolidation immunotherapy with Imfinzi 10 mg/kg IV every 2 weeks. He is status post26cycles. He completed this on10/03/2019  He was on observation until he showed evidence of local recurrence.   He is currently undergoing systemic chemotherapy with Keytruda 200 mg IV every 3 weeks. He is status post4cycles.He tolerated this well without any concerning complains  Labs were reviewed. Recommend that he proceed with cycle #5 today as scheduled.    We will see him back for a follow up visit in 3 weeks for evaluation before starting cycle #6.   He will continue to use his stool softener for constipation.   The patient knows that we strongly encourage smoking cessation. He is not interested in quitting at this time.  We will monitor his TSH closely and make adjustments as necessary. TSH pending at this time.  The patient was advised to call immediately if he has any concerning symptoms in the interval. The patient voices understanding of current disease status and treatment options and is in agreement with the current care plan. All questions were answered. The patient knows to call the clinic with any problems, questions or concerns. We can certainly see the patient much sooner if necessary    No orders of the defined types were placed in this encounter.    Jesicca Dipierro L Josiane Labine, PA-C 05/24/20

## 2020-05-27 DIAGNOSIS — E063 Autoimmune thyroiditis: Secondary | ICD-10-CM | POA: Diagnosis not present

## 2020-05-27 DIAGNOSIS — J449 Chronic obstructive pulmonary disease, unspecified: Secondary | ICD-10-CM | POA: Diagnosis not present

## 2020-05-27 DIAGNOSIS — Z720 Tobacco use: Secondary | ICD-10-CM | POA: Diagnosis not present

## 2020-05-27 DIAGNOSIS — C3491 Malignant neoplasm of unspecified part of right bronchus or lung: Secondary | ICD-10-CM | POA: Diagnosis not present

## 2020-05-28 ENCOUNTER — Inpatient Hospital Stay: Payer: Medicare Other

## 2020-05-28 ENCOUNTER — Other Ambulatory Visit: Payer: Self-pay

## 2020-05-28 ENCOUNTER — Other Ambulatory Visit: Payer: Medicare Other

## 2020-05-28 ENCOUNTER — Inpatient Hospital Stay (HOSPITAL_BASED_OUTPATIENT_CLINIC_OR_DEPARTMENT_OTHER): Payer: Medicare Other | Admitting: Physician Assistant

## 2020-05-28 ENCOUNTER — Ambulatory Visit: Payer: Medicare Other

## 2020-05-28 ENCOUNTER — Ambulatory Visit: Payer: Medicare Other | Admitting: Internal Medicine

## 2020-05-28 ENCOUNTER — Inpatient Hospital Stay: Payer: Medicare Other | Attending: Internal Medicine

## 2020-05-28 VITALS — BP 123/80 | HR 77 | Temp 97.4°F | Resp 18 | Ht 73.0 in | Wt 233.1 lb

## 2020-05-28 DIAGNOSIS — C3411 Malignant neoplasm of upper lobe, right bronchus or lung: Secondary | ICD-10-CM | POA: Insufficient documentation

## 2020-05-28 DIAGNOSIS — C3491 Malignant neoplasm of unspecified part of right bronchus or lung: Secondary | ICD-10-CM

## 2020-05-28 DIAGNOSIS — E7800 Pure hypercholesterolemia, unspecified: Secondary | ICD-10-CM | POA: Diagnosis not present

## 2020-05-28 DIAGNOSIS — Z79899 Other long term (current) drug therapy: Secondary | ICD-10-CM | POA: Insufficient documentation

## 2020-05-28 DIAGNOSIS — I252 Old myocardial infarction: Secondary | ICD-10-CM | POA: Insufficient documentation

## 2020-05-28 DIAGNOSIS — Z923 Personal history of irradiation: Secondary | ICD-10-CM | POA: Insufficient documentation

## 2020-05-28 DIAGNOSIS — B182 Chronic viral hepatitis C: Secondary | ICD-10-CM | POA: Diagnosis not present

## 2020-05-28 DIAGNOSIS — Z5112 Encounter for antineoplastic immunotherapy: Secondary | ICD-10-CM

## 2020-05-28 DIAGNOSIS — Z7982 Long term (current) use of aspirin: Secondary | ICD-10-CM | POA: Diagnosis not present

## 2020-05-28 DIAGNOSIS — J449 Chronic obstructive pulmonary disease, unspecified: Secondary | ICD-10-CM | POA: Insufficient documentation

## 2020-05-28 DIAGNOSIS — Z86718 Personal history of other venous thrombosis and embolism: Secondary | ICD-10-CM | POA: Diagnosis not present

## 2020-05-28 DIAGNOSIS — Z95810 Presence of automatic (implantable) cardiac defibrillator: Secondary | ICD-10-CM | POA: Insufficient documentation

## 2020-05-28 DIAGNOSIS — F419 Anxiety disorder, unspecified: Secondary | ICD-10-CM | POA: Insufficient documentation

## 2020-05-28 DIAGNOSIS — F329 Major depressive disorder, single episode, unspecified: Secondary | ICD-10-CM | POA: Insufficient documentation

## 2020-05-28 DIAGNOSIS — Z95828 Presence of other vascular implants and grafts: Secondary | ICD-10-CM

## 2020-05-28 LAB — CBC WITH DIFFERENTIAL (CANCER CENTER ONLY)
Abs Immature Granulocytes: 0.13 10*3/uL — ABNORMAL HIGH (ref 0.00–0.07)
Basophils Absolute: 0.1 10*3/uL (ref 0.0–0.1)
Basophils Relative: 1 %
Eosinophils Absolute: 0.4 10*3/uL (ref 0.0–0.5)
Eosinophils Relative: 4 %
HCT: 44 % (ref 39.0–52.0)
Hemoglobin: 14.3 g/dL (ref 13.0–17.0)
Immature Granulocytes: 2 %
Lymphocytes Relative: 11 %
Lymphs Abs: 1 10*3/uL (ref 0.7–4.0)
MCH: 31.1 pg (ref 26.0–34.0)
MCHC: 32.5 g/dL (ref 30.0–36.0)
MCV: 95.7 fL (ref 80.0–100.0)
Monocytes Absolute: 0.7 10*3/uL (ref 0.1–1.0)
Monocytes Relative: 7 %
Neutro Abs: 6.7 10*3/uL (ref 1.7–7.7)
Neutrophils Relative %: 75 %
Platelet Count: 288 10*3/uL (ref 150–400)
RBC: 4.6 MIL/uL (ref 4.22–5.81)
RDW: 14.9 % (ref 11.5–15.5)
WBC Count: 8.9 10*3/uL (ref 4.0–10.5)
nRBC: 0 % (ref 0.0–0.2)

## 2020-05-28 LAB — CMP (CANCER CENTER ONLY)
ALT: 32 U/L (ref 0–44)
AST: 28 U/L (ref 15–41)
Albumin: 4.2 g/dL (ref 3.5–5.0)
Alkaline Phosphatase: 124 U/L (ref 38–126)
Anion gap: 9 (ref 5–15)
BUN: 11 mg/dL (ref 6–20)
CO2: 33 mmol/L — ABNORMAL HIGH (ref 22–32)
Calcium: 10.1 mg/dL (ref 8.9–10.3)
Chloride: 96 mmol/L — ABNORMAL LOW (ref 98–111)
Creatinine: 1.23 mg/dL (ref 0.61–1.24)
GFR, Est AFR Am: >60 mL/min (ref >60)
GFR, Estimated: >60 mL/min (ref >60)
Glucose, Bld: 89 mg/dL (ref 70–99)
Potassium: 4.4 mmol/L (ref 3.5–5.1)
Sodium: 138 mmol/L (ref 135–145)
Total Bilirubin: 0.5 mg/dL (ref 0.3–1.2)
Total Protein: 7.7 g/dL (ref 6.5–8.1)

## 2020-05-28 LAB — TSH: TSH: 23.548 u[IU]/mL — ABNORMAL HIGH (ref 0.320–4.118)

## 2020-05-28 MED ORDER — ALTEPLASE 2 MG IJ SOLR
INTRAMUSCULAR | Status: AC
  Filled 2020-05-28: qty 2

## 2020-05-28 MED ORDER — HEPARIN SOD (PORK) LOCK FLUSH 100 UNIT/ML IV SOLN
500.0000 [IU] | Freq: Once | INTRAVENOUS | Status: AC | PRN
  Administered 2020-05-28: 500 [IU]
  Filled 2020-05-28: qty 5

## 2020-05-28 MED ORDER — SODIUM CHLORIDE 0.9 % IV SOLN
200.0000 mg | Freq: Once | INTRAVENOUS | Status: AC
  Administered 2020-05-28: 200 mg via INTRAVENOUS
  Filled 2020-05-28: qty 8

## 2020-05-28 MED ORDER — ALTEPLASE 2 MG IJ SOLR
2.0000 mg | Freq: Once | INTRAMUSCULAR | Status: AC
  Administered 2020-05-28: 2 mg
  Filled 2020-05-28: qty 2

## 2020-05-28 MED ORDER — SODIUM CHLORIDE 0.9% FLUSH
10.0000 mL | INTRAVENOUS | Status: DC | PRN
  Administered 2020-05-28: 10 mL
  Filled 2020-05-28: qty 10

## 2020-05-28 MED ORDER — SODIUM CHLORIDE 0.9 % IV SOLN
Freq: Once | INTRAVENOUS | Status: AC
  Filled 2020-05-28: qty 250

## 2020-05-28 MED ORDER — SODIUM CHLORIDE 0.9% FLUSH
10.0000 mL | Freq: Once | INTRAVENOUS | Status: AC
  Administered 2020-05-28: 10 mL
  Filled 2020-05-28: qty 10

## 2020-05-28 NOTE — Progress Notes (Signed)
Unable to get blood return from port. [facility] Administered by Amy RN. Patient sent back to lab to get blood drawn from arm

## 2020-05-28 NOTE — Patient Instructions (Signed)
[facility] Discharge Instructions for Patients Receiving Chemotherapy  Today you received the following chemotherapy agents:  Keytruda.  To help prevent nausea and vomiting after your treatment, we encourage you to take your nausea medication as directed.   If you develop nausea and vomiting that is not controlled by your nausea medication, call the clinic.   BELOW ARE SYMPTOMS THAT SHOULD BE REPORTED IMMEDIATELY:  *FEVER GREATER THAN 100.5 F  *CHILLS WITH OR WITHOUT FEVER  NAUSEA AND VOMITING THAT IS NOT CONTROLLED WITH YOUR NAUSEA MEDICATION  *UNUSUAL SHORTNESS OF BREATH  *UNUSUAL BRUISING OR BLEEDING  TENDERNESS IN MOUTH AND THROAT WITH OR WITHOUT PRESENCE OF ULCERS  *URINARY PROBLEMS  *BOWEL PROBLEMS  UNUSUAL RASH Items with * indicate a potential emergency and should be followed up as soon as possible.  Feel free to call the clinic should you have any questions or concerns. The clinic phone number is [phone].  Please show the CHEMO ALERT CARD at check-in to the Emergency Department and triage nurse.    

## 2020-05-29 ENCOUNTER — Telehealth: Payer: Self-pay | Admitting: Internal Medicine

## 2020-05-29 NOTE — Telephone Encounter (Signed)
Scheduled per los. Called and spoke with patients wife. Confirmed appt

## 2020-06-03 DIAGNOSIS — G894 Chronic pain syndrome: Secondary | ICD-10-CM | POA: Diagnosis not present

## 2020-06-04 ENCOUNTER — Ambulatory Visit (INDEPENDENT_AMBULATORY_CARE_PROVIDER_SITE_OTHER): Payer: Medicare Other | Admitting: *Deleted

## 2020-06-04 DIAGNOSIS — I255 Ischemic cardiomyopathy: Secondary | ICD-10-CM | POA: Diagnosis not present

## 2020-06-04 LAB — CUP PACEART REMOTE DEVICE CHECK
Battery Remaining Longevity: 122 mo
Battery Voltage: 3.01 V
Brady Statistic RV Percent Paced: 0.01 %
Date Time Interrogation Session: 20210908033523
HighPow Impedance: 86 Ohm
Implantable Lead Implant Date: 20190606
Implantable Lead Location: 753860
Implantable Pulse Generator Implant Date: 20190606
Lead Channel Impedance Value: 342 Ohm
Lead Channel Impedance Value: 418 Ohm
Lead Channel Pacing Threshold Amplitude: 0.875 V
Lead Channel Pacing Threshold Pulse Width: 0.4 ms
Lead Channel Sensing Intrinsic Amplitude: 23 mV
Lead Channel Sensing Intrinsic Amplitude: 23 mV
Lead Channel Setting Pacing Amplitude: 2.5 V
Lead Channel Setting Pacing Pulse Width: 0.4 ms
Lead Channel Setting Sensing Sensitivity: 0.3 mV

## 2020-06-06 NOTE — Progress Notes (Signed)
Remote ICD transmission.   

## 2020-06-18 ENCOUNTER — Inpatient Hospital Stay: Payer: Medicare Other

## 2020-06-18 ENCOUNTER — Other Ambulatory Visit: Payer: Self-pay

## 2020-06-18 ENCOUNTER — Encounter: Payer: Self-pay | Admitting: Internal Medicine

## 2020-06-18 ENCOUNTER — Inpatient Hospital Stay (HOSPITAL_BASED_OUTPATIENT_CLINIC_OR_DEPARTMENT_OTHER): Payer: Medicare Other | Admitting: Internal Medicine

## 2020-06-18 VITALS — BP 107/77 | HR 72 | Temp 97.5°F | Resp 17 | Ht 73.0 in | Wt 240.9 lb

## 2020-06-18 DIAGNOSIS — C3490 Malignant neoplasm of unspecified part of unspecified bronchus or lung: Secondary | ICD-10-CM

## 2020-06-18 DIAGNOSIS — Z5111 Encounter for antineoplastic chemotherapy: Secondary | ICD-10-CM

## 2020-06-18 DIAGNOSIS — C3491 Malignant neoplasm of unspecified part of right bronchus or lung: Secondary | ICD-10-CM | POA: Diagnosis not present

## 2020-06-18 DIAGNOSIS — I252 Old myocardial infarction: Secondary | ICD-10-CM | POA: Diagnosis not present

## 2020-06-18 DIAGNOSIS — J449 Chronic obstructive pulmonary disease, unspecified: Secondary | ICD-10-CM | POA: Diagnosis not present

## 2020-06-18 DIAGNOSIS — C3411 Malignant neoplasm of upper lobe, right bronchus or lung: Secondary | ICD-10-CM | POA: Diagnosis not present

## 2020-06-18 DIAGNOSIS — Z95828 Presence of other vascular implants and grafts: Secondary | ICD-10-CM

## 2020-06-18 DIAGNOSIS — Z7982 Long term (current) use of aspirin: Secondary | ICD-10-CM | POA: Diagnosis not present

## 2020-06-18 DIAGNOSIS — Z95810 Presence of automatic (implantable) cardiac defibrillator: Secondary | ICD-10-CM | POA: Diagnosis not present

## 2020-06-18 DIAGNOSIS — Z5112 Encounter for antineoplastic immunotherapy: Secondary | ICD-10-CM | POA: Diagnosis not present

## 2020-06-18 DIAGNOSIS — Z86718 Personal history of other venous thrombosis and embolism: Secondary | ICD-10-CM | POA: Diagnosis not present

## 2020-06-18 DIAGNOSIS — E7800 Pure hypercholesterolemia, unspecified: Secondary | ICD-10-CM | POA: Diagnosis not present

## 2020-06-18 DIAGNOSIS — Z923 Personal history of irradiation: Secondary | ICD-10-CM | POA: Diagnosis not present

## 2020-06-18 DIAGNOSIS — Z79899 Other long term (current) drug therapy: Secondary | ICD-10-CM | POA: Diagnosis not present

## 2020-06-18 LAB — TSH: TSH: 13.378 u[IU]/mL — ABNORMAL HIGH (ref 0.320–4.118)

## 2020-06-18 LAB — CMP (CANCER CENTER ONLY)
ALT: 26 U/L (ref 0–44)
AST: 21 U/L (ref 15–41)
Albumin: 3.9 g/dL (ref 3.5–5.0)
Alkaline Phosphatase: 153 U/L — ABNORMAL HIGH (ref 38–126)
Anion gap: 6 (ref 5–15)
BUN: 9 mg/dL (ref 6–20)
CO2: 35 mmol/L — ABNORMAL HIGH (ref 22–32)
Calcium: 9.2 mg/dL (ref 8.9–10.3)
Chloride: 95 mmol/L — ABNORMAL LOW (ref 98–111)
Creatinine: 1.11 mg/dL (ref 0.61–1.24)
GFR, Est AFR Am: >60 mL/min (ref >60)
GFR, Estimated: >60 mL/min (ref >60)
Glucose, Bld: 104 mg/dL — ABNORMAL HIGH (ref 70–99)
Potassium: 4 mmol/L (ref 3.5–5.1)
Sodium: 136 mmol/L (ref 135–145)
Total Bilirubin: 0.5 mg/dL (ref 0.3–1.2)
Total Protein: 7.3 g/dL (ref 6.5–8.1)

## 2020-06-18 LAB — CBC WITH DIFFERENTIAL (CANCER CENTER ONLY)
Abs Immature Granulocytes: 0.08 10*3/uL — ABNORMAL HIGH (ref 0.00–0.07)
Basophils Absolute: 0.1 10*3/uL (ref 0.0–0.1)
Basophils Relative: 1 %
Eosinophils Absolute: 0.5 10*3/uL (ref 0.0–0.5)
Eosinophils Relative: 6 %
HCT: 43.5 % (ref 39.0–52.0)
Hemoglobin: 14.2 g/dL (ref 13.0–17.0)
Immature Granulocytes: 1 %
Lymphocytes Relative: 8 %
Lymphs Abs: 0.8 10*3/uL (ref 0.7–4.0)
MCH: 31.3 pg (ref 26.0–34.0)
MCHC: 32.6 g/dL (ref 30.0–36.0)
MCV: 96 fL (ref 80.0–100.0)
Monocytes Absolute: 0.6 10*3/uL (ref 0.1–1.0)
Monocytes Relative: 7 %
Neutro Abs: 7.1 10*3/uL (ref 1.7–7.7)
Neutrophils Relative %: 77 %
Platelet Count: 241 10*3/uL (ref 150–400)
RBC: 4.53 MIL/uL (ref 4.22–5.81)
RDW: 14.4 % (ref 11.5–15.5)
WBC Count: 9.2 10*3/uL (ref 4.0–10.5)
nRBC: 0 % (ref 0.0–0.2)

## 2020-06-18 MED ORDER — SODIUM CHLORIDE 0.9 % IV SOLN
200.0000 mg | Freq: Once | INTRAVENOUS | Status: AC
  Administered 2020-06-18: 200 mg via INTRAVENOUS
  Filled 2020-06-18: qty 8

## 2020-06-18 MED ORDER — SODIUM CHLORIDE 0.9 % IV SOLN
Freq: Once | INTRAVENOUS | Status: AC
  Filled 2020-06-18: qty 250

## 2020-06-18 MED ORDER — SODIUM CHLORIDE 0.9% FLUSH
10.0000 mL | Freq: Once | INTRAVENOUS | Status: AC
  Administered 2020-06-18: 10 mL
  Filled 2020-06-18: qty 10

## 2020-06-18 MED ORDER — SODIUM CHLORIDE 0.9% FLUSH
10.0000 mL | INTRAVENOUS | Status: DC | PRN
  Administered 2020-06-18: 10 mL
  Filled 2020-06-18: qty 10

## 2020-06-18 MED ORDER — HEPARIN SOD (PORK) LOCK FLUSH 100 UNIT/ML IV SOLN
500.0000 [IU] | Freq: Once | INTRAVENOUS | Status: AC | PRN
  Administered 2020-06-18: 500 [IU]
  Filled 2020-06-18: qty 5

## 2020-06-18 NOTE — Patient Instructions (Signed)
[facility] Discharge Instructions for Patients Receiving Chemotherapy  Today you received the following chemotherapy agents:  Keytruda.  To help prevent nausea and vomiting after your treatment, we encourage you to take your nausea medication as directed.   If you develop nausea and vomiting that is not controlled by your nausea medication, call the clinic.   BELOW ARE SYMPTOMS THAT SHOULD BE REPORTED IMMEDIATELY:  *FEVER GREATER THAN 100.5 F  *CHILLS WITH OR WITHOUT FEVER  NAUSEA AND VOMITING THAT IS NOT CONTROLLED WITH YOUR NAUSEA MEDICATION  *UNUSUAL SHORTNESS OF BREATH  *UNUSUAL BRUISING OR BLEEDING  TENDERNESS IN MOUTH AND THROAT WITH OR WITHOUT PRESENCE OF ULCERS  *URINARY PROBLEMS  *BOWEL PROBLEMS  UNUSUAL RASH Items with * indicate a potential emergency and should be followed up as soon as possible.  Feel free to call the clinic should you have any questions or concerns. The clinic phone number is [phone].  Please show the CHEMO ALERT CARD at check-in to the Emergency Department and triage nurse.    

## 2020-06-18 NOTE — Progress Notes (Signed)
[facility] Telephone:([phone]) [phone]   Fax:([phone]) [phone]  OFFICE PROGRESS NOTE  [place], [place] [address]  DIAGNOSIS:stage IIIA (T3, N2, M0) non-small cell lung cancer, adenosquamous carcinoma diagnosed in June 2019 and presented with large right upper lobe lung mass with questionable chest wall invasion as well as right hilar and mediastinal lymphadenopathy.  Biomarker Findings Tumor Mutational Burden - TMB-High (21 Muts/Mb) Microsatellite status - MS-Stable Genomic Findings For a complete list of the genes assayed, please refer to the Appendix. ATM V6945* CCND2 P281R KRAS G12V CHEK2 T324fs*15 TP53 E298* 7 Disease relevant genes with no reportable alterations: ALK, EGFR, BRAF, MET, RET, ERBB2, ROS1  PDL1 Expression: 99%.  PRIOR THERAPY: 1) Concurrent chemoradiation with chemotherapy consisting of weekly carboplatin for an AUC of 2 and paclitaxel 45 mg/m2. First dose given on 04/03/2018.  Status post 6 cycles.  Last dose was giving 05/15/2018 with partial response. 2) Consolidation treatment with immunotherapy with Imfinzi (Durvalumab) 10 mg/KG every 2 weeks.  First dose 06/20/2018.  Status post 26 cycles.  CURRENT THERAPY:Systemic treatment with immunotherapy with Keytruda 200 mg IV every 3 weeks.  First dose February 27, 2020.  Status post 5 cycles.   INTERVAL HISTORY: [name] 59 y.o. male returns to the clinic today for follow-up visit accompanied by his wife.  The patient is feeling fine today with no concerning complaints.  He continues to tolerate his treatment with Keytruda fairly well.  He denied having any chest pain, shortness of breath, cough or hemoptysis.  He denied having any nausea, vomiting, diarrhea or constipation.  He has no headache or visual changes.  He has no weight loss or night sweats.  The patient is here today for evaluation before starting cycle #6 of his treatment.   MEDICAL HISTORY: Past  Medical History:  Diagnosis Date  . Acute hepatitis C virus infection 06/19/2007   Qualifier: Diagnosis of  By: Leilani Merl CMA, Tiffany    . Acute ST elevation myocardial infarction (STEMI) involving left anterior descending (LAD) coronary artery ([facility]) 05/26/2016  . Acute ST elevation myocardial infarction (STEMI) of lateral wall ([facility]) 05/26/2016  . 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 Anxiety   . Asthma   . Atherosclerosis of native arteries of the extremities with intermittent claudication 12/16/2011  . Chronic hepatitis C without hepatic coma ([facility]) 05/03/2014  . COPD with chronic bronchitis and emphysema ([facility]) 03/13/2018   FEV1 57%  . Depression   . DVT (deep venous thrombosis) ([facility]) 2009; 2019   RLE; LLE  . Encounter for antineoplastic chemotherapy 03/22/2018  . Hepatitis C    "tx'd in 2015"  . High cholesterol   . Ischemic cardiomyopathy 03/02/2018  . Non-small cell carcinoma of right lung, stage 3 ([facility]) 03/22/2018  . PAD (peripheral artery disease) ([facility]) 01/25/2013  . Peripheral vascular disease, unspecified 07/20/2012  . Port-A-Cath in place 04/24/2018  . ST elevation myocardial infarction involving left anterior descending (LAD) coronary artery ([facility])   . STEMI (ST elevation myocardial infarction) ([facility]) 05/26/2016  . Tobacco abuse 01/28/2016  . Tubular adenoma of colon 07/11/2014  . Urinary dribbling     ALLERGIES:  is allergic to lyrica [pregabalin] and neurontin [gabapentin].  MEDICATIONS:  Current Outpatient Medications  Medication Sig Dispense Refill  . alprazolam (XANAX) 2 MG tablet Take 2 mg by mouth 4 (four) times daily as needed for anxiety.     [name] amitriptyline (ELAVIL) 100 MG tablet Take 100 mg by mouth  at bedtime.     [name] aspirin 81 MG EC tablet Take 81 mg by mouth daily.      [name] atorvastatin (LIPITOR) 80 MG tablet Take 1 tablet (80 mg total) by mouth daily at 6 PM. 30 tablet 12  . carvedilol (COREG) 6.25 MG tablet TAKE 1 TABLET (6.25 MG TOTAL) BY MOUTH 2  (TWO) TIMES DAILY WITH A MEAL. OFFICE VISIT NEEDED 60 tablet 1  . citalopram (CELEXA) 40 MG tablet Take 40 mg by mouth daily.  11  . furosemide (LASIX) 20 MG tablet Take 1 tablet (20 mg total) by mouth daily. Please make overdue appt with Dr. Oval Linsey before anymore refills. 2nd attempt 15 tablet 0  . HYDROcodone-acetaminophen (NORCO) 10-325 MG tablet Take 1 tablet by mouth every 4 (four) hours as needed for moderate pain.   0  . levothyroxine (SYNTHROID) 125 MCG tablet TAKE 1 TABLET BY MOUTH EVERY DAY 30 tablet 2  . lidocaine-prilocaine (EMLA) cream Apply 1 application topically as needed. 30 g 0  . lisinopril (PRINIVIL,ZESTRIL) 2.5 MG tablet Take 1 tablet (2.5 mg total) by mouth daily. 30 tablet 12  . nitroGLYCERIN (NITROSTAT) 0.4 MG SL tablet Place 1 tablet (0.4 mg total) under the tongue every 5 (five) minutes x 3 doses as needed for chest pain. 25 tablet 2  . prochlorperazine (COMPAZINE) 10 MG tablet Take 1 tablet (10 mg total) by mouth every 6 (six) hours as needed for nausea or vomiting. 30 tablet 0  . sildenafil (VIAGRA) 25 MG tablet TAKE 1 TABLET BY MOUTH 60UMINUTES PRIOR TOEENCOUNTER.S    . spironolactone (ALDACTONE) 25 MG tablet TAKE 1 TABLET BY MOUTH EVERY DAY 90 tablet 0  . sucralfate (CARAFATE) 1 g tablet Take 1 tablet (1 g total) by mouth 4 (four) times daily -  with meals and at bedtime. 5 min before meals for radiation induced esophagitis 120 tablet 2  . tamsulosin (FLOMAX) 0.4 MG CAPS capsule Take 1 capsule by mouth daily.    . Tiotropium Bromide-Olodaterol (STIOLTO RESPIMAT) 2.5-2.5 MCG/ACT AERS Inhale 2 puffs into the lungs daily. 1 Inhaler 5  . umeclidinium-vilanterol (ANORO ELLIPTA) 62.5-25 MCG/INH AEPB Inhale 1 puff into the lungs daily. 1 each 0   No current facility-administered medications for this visit.   Facility-Administered Medications Ordered in Other Visits  Medication Dose Route Frequency Provider Last Rate Last Admin  . sodium chloride flush (NS) 0.9 % injection  10 mL  10 mL Intracatheter PRN Curt Bears, MD   10 mL at 03/19/20 1544    SURGICAL HISTORY:  Past Surgical History:  Procedure Laterality Date  . AORTOGRAM  08/08/2009   for LLE claudication     By Dr. Oneida Alar  . 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 BIOPSY N/A 05/30/2014   Procedure: BIOPSY;  Surgeon: [name], MD;  Location: [facility];  Service: Endoscopy;  Laterality: N/A;  . BLADDER TUMOR EXCISION  8/812  . CARDIAC CATHETERIZATION N/A 05/26/2016   Procedure: Left Heart Cath and Coronary Angiography;  Surgeon: [name], MD;  Location: [facility];  Service: Cardiovascular;  Laterality: N/A;  . CARDIAC CATHETERIZATION N/A 05/26/2016   Procedure: Coronary Stent Intervention;  Surgeon: [name], MD;  Location: [facility];  Service: Cardiovascular;  Laterality: N/A;  . COLONOSCOPY WITH PROPOFOL N/A 05/30/2014   IHK:VQQVZD colonic polyp-likely source of hematochezia-removed as described above  . ESOPHAGOGASTRODUODENOSCOPY (EGD) WITH PROPOFOL N/A 05/30/2014   GLO:VFIEPP and bulbar erosions s/p gastric biopsy. No  evidence of portal gastropathy on today's examination.  . FEMORAL-TIBIAL BYPASS GRAFT  2009   Right side using non-reversed GSV   By Dr. Oneida Alar  . FEMORAL-TIBIAL BYPASS GRAFT  11/24/2007   Right femoral to anterior tibial BPG   by Dr. Oneida Alar  . FINGER SURGERY Left    "straightened my pinky"  . HAND TENDON SURGERY Left 2013   Left 5th finger  . HERNIA REPAIR     "stomach"  . ICD IMPLANT N/A 03/02/2018   Procedure: ICD IMPLANT;  Surgeon: [name], MD;  Location: [facility];  Service: Cardiovascular;  Laterality: N/A;  . INCISIONAL HERNIA REPAIR N/A 12/25/2014   Procedure: HERNIA REPAIR INCISIONAL WITH MESH;  Surgeon: [name], MD;  Location: [facility];  Service: General;  Laterality: N/A;  . INSERTION OF MESH N/A 12/25/2014   Procedure: INSERTION OF MESH;  Surgeon: [name], MD;  Location: [facility];  Service: General;   Laterality: N/A;  . IR IMAGING GUIDED PORT INSERTION  04/11/2018  . LAPAROSCOPIC CHOLECYSTECTOMY    . LOWER EXTREMITY ANGIOGRAM Left 12/30/2015   Procedure: Lower Extremity Angiogram;  Surgeon: [name], MD;  Location: [facility];  Service: Cardiovascular;  Laterality: Left;  . PERIPHERAL VASCULAR CATHETERIZATION N/A 12/30/2015   Procedure: Abdominal Aortogram;  Surgeon: [name], MD;  Location: [facility];  Service: Cardiovascular;  Laterality: N/A;  . POLYPECTOMY  05/30/2014   Procedure: POLYPECTOMY;  Surgeon: [name], MD;  Location: [facility];  Service: Endoscopy;;  . TONSILLECTOMY AND ADENOIDECTOMY  ~ 1970  . TYMPANOSTOMY TUBE PLACEMENT Bilateral ~ 1970    REVIEW OF SYSTEMS:  A comprehensive review of systems was negative except for: Constitutional: positive for fatigue   PHYSICAL EXAMINATION: General appearance: alert, cooperative and no distress Head: Normocephalic, without obvious abnormality, atraumatic Neck: no adenopathy, no JVD, supple, symmetrical, trachea midline and thyroid not enlarged, symmetric, no tenderness/mass/nodules Lymph nodes: Cervical, supraclavicular, and axillary nodes normal. Resp: clear to auscultation bilaterally Back: symmetric, no curvature. ROM normal. No CVA tenderness. Cardio: regular rate and rhythm, S1, S2 normal, no murmur, click, rub or gallop GI: soft, non-tender; bowel sounds normal; no masses,  no organomegaly Extremities: Right below knee amputation otherwise no edema.  ECOG PERFORMANCE STATUS: 1 - Symptomatic but completely ambulatory  Blood pressure 107/77, pulse 72, temperature (!) 97.5 F (36.4 C), temperature source Tympanic, resp. rate 17, height $RemoveBe'6\' 1"'EJveuOEEU$  (1.854 m), weight 240 lb 14.4 oz (109.3 kg), SpO2 97 %.  LABORATORY DATA: Lab Results  Component Value Date   WBC 8.9 05/28/2020   HGB 14.3 05/28/2020   HCT 44.0 05/28/2020   MCV 95.7 05/28/2020   PLT 288 05/28/2020      Chemistry      Component  Value Date/Time   NA 138 05/28/2020 1405   NA 139 02/27/2018 1457   K 4.4 05/28/2020 1405   CL 96 (L) 05/28/2020 1405   CO2 33 (H) 05/28/2020 1405   BUN 11 05/28/2020 1405   BUN 7 02/27/2018 1457   CREATININE 1.23 05/28/2020 1405   CREATININE 1.13 06/28/2016 1453      Component Value Date/Time   CALCIUM 10.1 05/28/2020 1405   ALKPHOS 124 05/28/2020 1405   AST 28 05/28/2020 1405   ALT 32 05/28/2020 1405   BILITOT 0.5 05/28/2020 1405       RADIOGRAPHIC STUDIES: CUP PACEART REMOTE DEVICE CHECK  Result Date: 06/04/2020 Scheduled remote reviewed. Normal device function.  Next remote 91 days. JM   ASSESSMENT AND PLAN: This is a very pleasant 59 years old white male recently diagnosed with a stage IIIa non-small cell lung cancer, adenosquamous carcinoma.  He underwent a course of concurrent chemoradiation with weekly carboplatin and paclitaxel status post 6 cycles with partial response.  He has no actionable mutation but PD-L1 expression is 99%. The patient completed treatment with consolidation immunotherapy with Imfinzi status post 26 cycles.  He tolerated this treatment well with no concerning adverse effects. The patient was on observation for several months but he developed disease progression. He is currently undergoing treatment with systemic immunotherapy with Keytruda 200 mg IV every 3 weeks status post 5 cycles.  The patient is tolerating this treatment well with no concerning adverse effects. I recommended for him to proceed with cycle #6 today as planned. I will see him back for follow-up visit in 3 weeks for evaluation with repeat CT scan of the chest for restaging of his disease before starting cycle #7. The patient was advised to call immediately if he has any concerning symptoms in the interval. The patient voices understanding of current disease status and treatment options and is in agreement with the current care plan. All questions were answered. The patient knows to  call the clinic with any problems, questions or concerns. We can certainly see the patient much sooner if necessary.   Disclaimer: This note was dictated with voice recognition software. Similar sounding words can inadvertently be transcribed and may not be corrected upon review.

## 2020-06-18 NOTE — Patient Instructions (Signed)

## 2020-06-26 DIAGNOSIS — I10 Essential (primary) hypertension: Secondary | ICD-10-CM | POA: Diagnosis not present

## 2020-06-26 DIAGNOSIS — E063 Autoimmune thyroiditis: Secondary | ICD-10-CM | POA: Diagnosis not present

## 2020-06-26 DIAGNOSIS — J449 Chronic obstructive pulmonary disease, unspecified: Secondary | ICD-10-CM | POA: Diagnosis not present

## 2020-06-26 DIAGNOSIS — Z720 Tobacco use: Secondary | ICD-10-CM | POA: Diagnosis not present

## 2020-06-30 DIAGNOSIS — G894 Chronic pain syndrome: Secondary | ICD-10-CM | POA: Diagnosis not present

## 2020-06-30 DIAGNOSIS — J449 Chronic obstructive pulmonary disease, unspecified: Secondary | ICD-10-CM | POA: Diagnosis not present

## 2020-07-06 ENCOUNTER — Other Ambulatory Visit: Payer: Self-pay | Admitting: Cardiovascular Disease

## 2020-07-07 ENCOUNTER — Encounter (HOSPITAL_COMMUNITY): Payer: Self-pay

## 2020-07-07 ENCOUNTER — Ambulatory Visit (HOSPITAL_COMMUNITY)
Admission: RE | Admit: 2020-07-07 | Discharge: 2020-07-07 | Disposition: A | Discharge status: Home / Self Care | Payer: Medicare Other | Source: Ambulatory Visit | Attending: Internal Medicine | Admitting: Internal Medicine

## 2020-07-07 ENCOUNTER — Other Ambulatory Visit: Payer: Self-pay

## 2020-07-07 DIAGNOSIS — J439 Emphysema, unspecified: Secondary | ICD-10-CM | POA: Diagnosis not present

## 2020-07-07 DIAGNOSIS — I700 Atherosclerosis of aorta: Secondary | ICD-10-CM | POA: Diagnosis not present

## 2020-07-07 DIAGNOSIS — I2510 Atherosclerotic heart disease of native coronary artery without angina pectoris: Secondary | ICD-10-CM | POA: Diagnosis not present

## 2020-07-07 DIAGNOSIS — C3490 Malignant neoplasm of unspecified part of unspecified bronchus or lung: Secondary | ICD-10-CM | POA: Diagnosis not present

## 2020-07-07 MED ORDER — HEPARIN SOD (PORK) LOCK FLUSH 100 UNIT/ML IV SOLN
INTRAVENOUS | Status: AC
  Administered 2020-07-07: 500 [IU] via INTRAVENOUS
  Filled 2020-07-07: qty 5

## 2020-07-07 MED ORDER — HEPARIN SOD (PORK) LOCK FLUSH 100 UNIT/ML IV SOLN: 500.0000 [IU] | Freq: Once | INTRAVENOUS | Status: AC

## 2020-07-07 MED ORDER — IOHEXOL 300 MG/ML  SOLN
75.0000 mL | Freq: Once | INTRAMUSCULAR | Status: AC | PRN
  Administered 2020-07-07: 75 mL via INTRAVENOUS

## 2020-07-07 NOTE — Telephone Encounter (Signed)
Rx has been sent to the pharmacy electronically. ° °

## 2020-07-09 ENCOUNTER — Inpatient Hospital Stay: Payer: Medicare Other

## 2020-07-09 ENCOUNTER — Other Ambulatory Visit: Payer: Self-pay

## 2020-07-09 ENCOUNTER — Inpatient Hospital Stay: Payer: Medicare Other | Attending: Internal Medicine | Admitting: Internal Medicine

## 2020-07-09 ENCOUNTER — Encounter: Payer: Self-pay | Admitting: Internal Medicine

## 2020-07-09 VITALS — BP 94/65 | HR 64 | Temp 97.5°F | Resp 18 | Ht 73.0 in | Wt 235.9 lb

## 2020-07-09 DIAGNOSIS — Z86718 Personal history of other venous thrombosis and embolism: Secondary | ICD-10-CM | POA: Diagnosis not present

## 2020-07-09 DIAGNOSIS — I252 Old myocardial infarction: Secondary | ICD-10-CM | POA: Diagnosis not present

## 2020-07-09 DIAGNOSIS — Z79899 Other long term (current) drug therapy: Secondary | ICD-10-CM | POA: Insufficient documentation

## 2020-07-09 DIAGNOSIS — Z923 Personal history of irradiation: Secondary | ICD-10-CM | POA: Diagnosis not present

## 2020-07-09 DIAGNOSIS — F419 Anxiety disorder, unspecified: Secondary | ICD-10-CM | POA: Insufficient documentation

## 2020-07-09 DIAGNOSIS — C3491 Malignant neoplasm of unspecified part of right bronchus or lung: Secondary | ICD-10-CM

## 2020-07-09 DIAGNOSIS — Z5112 Encounter for antineoplastic immunotherapy: Secondary | ICD-10-CM | POA: Insufficient documentation

## 2020-07-09 DIAGNOSIS — J449 Chronic obstructive pulmonary disease, unspecified: Secondary | ICD-10-CM | POA: Insufficient documentation

## 2020-07-09 DIAGNOSIS — C3411 Malignant neoplasm of upper lobe, right bronchus or lung: Secondary | ICD-10-CM | POA: Diagnosis not present

## 2020-07-09 DIAGNOSIS — E039 Hypothyroidism, unspecified: Secondary | ICD-10-CM | POA: Insufficient documentation

## 2020-07-09 DIAGNOSIS — F329 Major depressive disorder, single episode, unspecified: Secondary | ICD-10-CM | POA: Diagnosis not present

## 2020-07-09 DIAGNOSIS — E7800 Pure hypercholesterolemia, unspecified: Secondary | ICD-10-CM | POA: Insufficient documentation

## 2020-07-09 DIAGNOSIS — Z5111 Encounter for antineoplastic chemotherapy: Secondary | ICD-10-CM

## 2020-07-09 DIAGNOSIS — B182 Chronic viral hepatitis C: Secondary | ICD-10-CM | POA: Diagnosis not present

## 2020-07-09 DIAGNOSIS — Z9221 Personal history of antineoplastic chemotherapy: Secondary | ICD-10-CM | POA: Insufficient documentation

## 2020-07-09 DIAGNOSIS — Z7982 Long term (current) use of aspirin: Secondary | ICD-10-CM | POA: Diagnosis not present

## 2020-07-09 DIAGNOSIS — Z8719 Personal history of other diseases of the digestive system: Secondary | ICD-10-CM | POA: Diagnosis not present

## 2020-07-09 LAB — CMP (CANCER CENTER ONLY)
ALT: 17 U/L (ref 0–44)
AST: 17 U/L (ref 15–41)
Albumin: 3.7 g/dL (ref 3.5–5.0)
Alkaline Phosphatase: 129 U/L — ABNORMAL HIGH (ref 38–126)
Anion gap: 2 — ABNORMAL LOW (ref 5–15)
BUN: 8 mg/dL (ref 6–20)
CO2: 36 mmol/L — ABNORMAL HIGH (ref 22–32)
Calcium: 9.2 mg/dL (ref 8.9–10.3)
Chloride: 98 mmol/L (ref 98–111)
Creatinine: 1.04 mg/dL (ref 0.61–1.24)
GFR, Estimated: >60 mL/min (ref >60)
Glucose, Bld: 103 mg/dL — ABNORMAL HIGH (ref 70–99)
Potassium: 4.3 mmol/L (ref 3.5–5.1)
Sodium: 136 mmol/L (ref 135–145)
Total Bilirubin: 0.3 mg/dL (ref 0.3–1.2)
Total Protein: 7 g/dL (ref 6.5–8.1)

## 2020-07-09 LAB — CBC WITH DIFFERENTIAL (CANCER CENTER ONLY)
Abs Immature Granulocytes: 0.05 10*3/uL (ref 0.00–0.07)
Basophils Absolute: 0.1 10*3/uL (ref 0.0–0.1)
Basophils Relative: 1 %
Eosinophils Absolute: 0.4 10*3/uL (ref 0.0–0.5)
Eosinophils Relative: 5 %
HCT: 41.9 % (ref 39.0–52.0)
Hemoglobin: 13.7 g/dL (ref 13.0–17.0)
Immature Granulocytes: 1 %
Lymphocytes Relative: 9 %
Lymphs Abs: 0.7 10*3/uL (ref 0.7–4.0)
MCH: 31.3 pg (ref 26.0–34.0)
MCHC: 32.7 g/dL (ref 30.0–36.0)
MCV: 95.7 fL (ref 80.0–100.0)
Monocytes Absolute: 0.6 10*3/uL (ref 0.1–1.0)
Monocytes Relative: 7 %
Neutro Abs: 6.6 10*3/uL (ref 1.7–7.7)
Neutrophils Relative %: 77 %
Platelet Count: 226 10*3/uL (ref 150–400)
RBC: 4.38 MIL/uL (ref 4.22–5.81)
RDW: 14.1 % (ref 11.5–15.5)
WBC Count: 8.4 10*3/uL (ref 4.0–10.5)
nRBC: 0 % (ref 0.0–0.2)

## 2020-07-09 LAB — TSH: TSH: 16.92 u[IU]/mL — ABNORMAL HIGH (ref 0.320–4.118)

## 2020-07-09 MED ORDER — SODIUM CHLORIDE 0.9 % IV SOLN
Freq: Once | INTRAVENOUS | Status: AC
  Filled 2020-07-09: qty 250

## 2020-07-09 MED ORDER — SODIUM CHLORIDE 0.9 % IV SOLN
200.0000 mg | Freq: Once | INTRAVENOUS | Status: AC
  Administered 2020-07-09: 200 mg via INTRAVENOUS
  Filled 2020-07-09: qty 8

## 2020-07-09 MED ORDER — SODIUM CHLORIDE 0.9% FLUSH
10.0000 mL | INTRAVENOUS | Status: DC | PRN
  Administered 2020-07-09: 10 mL
  Filled 2020-07-09: qty 10

## 2020-07-09 MED ORDER — HEPARIN SOD (PORK) LOCK FLUSH 100 UNIT/ML IV SOLN
500.0000 [IU] | Freq: Once | INTRAVENOUS | Status: AC | PRN
  Administered 2020-07-09: 500 [IU]
  Filled 2020-07-09: qty 5

## 2020-07-09 NOTE — Progress Notes (Signed)
[facility] [phone]   [phone]  OFFICE PROGRESS NOTE  [place], [place] [address]  DIAGNOSIS:stage IIIA (T3, N2, M0) non-small cell lung cancer, adenosquamous carcinoma diagnosed in June 2019 and presented with large right upper lobe lung mass with questionable chest wall invasion as well as right hilar and mediastinal lymphadenopathy.  Biomarker Findings Tumor Mutational Burden - TMB-High (21 Muts/Mb) Microsatellite status - MS-Stable Genomic Findings For a complete list of the genes assayed, please refer to the Appendix. ATM F8182* CCND2 P281R KRAS G12V CHEK2 T322fs*15 TP53 E298* 7 Disease relevant genes with no reportable alterations: ALK, EGFR, BRAF, MET, RET, ERBB2, ROS1  PDL1 Expression: 99%.  PRIOR THERAPY: 1) Concurrent chemoradiation with chemotherapy consisting of weekly carboplatin for an AUC of 2 and paclitaxel 45 mg/m2. First dose given on 04/03/2018.  Status post 6 cycles.  Last dose was giving 05/15/2018 with partial response. 2) Consolidation treatment with immunotherapy with Imfinzi (Durvalumab) 10 mg/KG every 2 weeks.  First dose 06/20/2018.  Status post 26 cycles.  CURRENT THERAPY:Systemic treatment with immunotherapy with Keytruda 200 mg IV every 3 weeks.  First dose February 27, 2020.  Status post 6 cycles.   INTERVAL HISTORY: [name] 59 y.o. male returns to the clinic today for follow-up visit accompanied by his wife.  The patient is feeling fine today with no concerning complaints.  He has been tolerating his treatment with Keytruda fairly well.  He denied having any chest pain, shortness of breath, cough or hemoptysis.  He denied having any fever or chills.  He has no nausea, vomiting, diarrhea or constipation.  He denied having any headache or visual changes.  He had repeat CT scan of the chest performed recently and he is here for evaluation and discussion of his discuss  results.  MEDICAL HISTORY: Past Medical History:  Diagnosis Date  . Acute hepatitis C virus infection 06/19/2007   Qualifier: Diagnosis of  By: Leilani Merl CMA, Tiffany    . Acute ST elevation myocardial infarction (STEMI) involving left anterior descending (LAD) coronary artery ([place]) 05/26/2016  . Acute ST elevation myocardial infarction (STEMI) of lateral wall ([place]) 05/26/2016  . 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 Anxiety   . Asthma   . Atherosclerosis of native arteries of the extremities with intermittent claudication 12/16/2011  . Chronic hepatitis C without hepatic coma ([facility]) 05/03/2014  . COPD with chronic bronchitis and emphysema ([place]) 03/13/2018   FEV1 57%  . Depression   . DVT (deep venous thrombosis) ([facility]) 2009; 2019   RLE; LLE  . Encounter for antineoplastic chemotherapy 03/22/2018  . Hepatitis C    "tx'd in 2015"  . High cholesterol   . Ischemic cardiomyopathy 03/02/2018  . Non-small cell carcinoma of right lung, stage 3 ([facility]) 03/22/2018  . PAD (peripheral artery disease) ([place]) 01/25/2013  . Peripheral vascular disease, unspecified 07/20/2012  . Port-A-Cath in place 04/24/2018  . ST elevation myocardial infarction involving left anterior descending (LAD) coronary artery ([place])   . STEMI (ST elevation myocardial infarction) ([facility]) 05/26/2016  . Tobacco abuse 01/28/2016  . Tubular adenoma of colon 07/11/2014  . Urinary dribbling     ALLERGIES:  is allergic to lyrica [pregabalin] and neurontin [gabapentin].  MEDICATIONS:  Current Outpatient Medications  Medication Sig Dispense Refill  . alprazolam (XANAX) 2 MG tablet Take 2 mg by mouth 4 (four) times daily as needed for anxiety.     [name] amitriptyline (ELAVIL) 100 MG  tablet Take 100 mg by mouth at bedtime.     [name] aspirin 81 MG EC tablet Take 81 mg by mouth daily.      [name] atorvastatin (LIPITOR) 80 MG tablet Take 1 tablet (80 mg total) by mouth daily at 6 PM. 30 tablet 12  . carvedilol (COREG) 6.25 MG tablet TAKE  1 TABLET (6.25 MG TOTAL) BY MOUTH 2 (TWO) TIMES DAILY WITH A MEAL. OFFICE VISIT NEEDED 180 tablet 2  . citalopram (CELEXA) 40 MG tablet Take 40 mg by mouth daily.  11  . furosemide (LASIX) 20 MG tablet Take 1 tablet (20 mg total) by mouth daily. Please make overdue appt with Dr. Oval Linsey before anymore refills. 2nd attempt 15 tablet 0  . HYDROcodone-acetaminophen (NORCO) 10-325 MG tablet Take 1 tablet by mouth every 4 (four) hours as needed for moderate pain.   0  . levothyroxine (SYNTHROID) 125 MCG tablet TAKE 1 TABLET BY MOUTH EVERY DAY 30 tablet 2  . lidocaine-prilocaine (EMLA) cream Apply 1 application topically as needed. 30 g 0  . lisinopril (PRINIVIL,ZESTRIL) 2.5 MG tablet Take 1 tablet (2.5 mg total) by mouth daily. 30 tablet 12  . nitroGLYCERIN (NITROSTAT) 0.4 MG SL tablet Place 1 tablet (0.4 mg total) under the tongue every 5 (five) minutes x 3 doses as needed for chest pain. 25 tablet 2  . oxyCODONE-acetaminophen (PERCOCET) 10-325 MG tablet Take 1 tablet by mouth every 4 (four) hours as needed.    . prochlorperazine (COMPAZINE) 10 MG tablet Take 1 tablet (10 mg total) by mouth every 6 (six) hours as needed for nausea or vomiting. 30 tablet 0  . sildenafil (VIAGRA) 25 MG tablet TAKE 1 TABLET BY MOUTH 60UMINUTES PRIOR TOEENCOUNTER.S    . spironolactone (ALDACTONE) 25 MG tablet TAKE 1 TABLET BY MOUTH EVERY DAY 90 tablet 0  . sucralfate (CARAFATE) 1 g tablet Take 1 tablet (1 g total) by mouth 4 (four) times daily -  with meals and at bedtime. 5 min before meals for radiation induced esophagitis 120 tablet 2  . tamsulosin (FLOMAX) 0.4 MG CAPS capsule Take 1 capsule by mouth daily.    . Tiotropium Bromide-Olodaterol (STIOLTO RESPIMAT) 2.5-2.5 MCG/ACT AERS Inhale 2 puffs into the lungs daily. 1 Inhaler 5  . umeclidinium-vilanterol (ANORO ELLIPTA) 62.5-25 MCG/INH AEPB Inhale 1 puff into the lungs daily. 1 each 0   No current facility-administered medications for this visit.    Facility-Administered Medications Ordered in Other Visits  Medication Dose Route Frequency Provider Last Rate Last Admin  . sodium chloride flush (NS) 0.9 % injection 10 mL  10 mL Intracatheter PRN Curt Bears, MD   10 mL at 03/19/20 1544    SURGICAL HISTORY:  Past Surgical History:  Procedure Laterality Date  . AORTOGRAM  08/08/2009   for LLE claudication     By Dr. Oneida Alar  . 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 BIOPSY N/A 05/30/2014   Procedure: BIOPSY;  Surgeon: [name], MD;  Location: [facility];  Service: Endoscopy;  Laterality: N/A;  . BLADDER TUMOR EXCISION  8/812  . CARDIAC CATHETERIZATION N/A 05/26/2016   Procedure: Left Heart Cath and Coronary Angiography;  Surgeon: [name], MD;  Location: [facility];  Service: Cardiovascular;  Laterality: N/A;  . CARDIAC CATHETERIZATION N/A 05/26/2016   Procedure: Coronary Stent Intervention;  Surgeon: [name], MD;  Location: [facility];  Service: Cardiovascular;  Laterality: N/A;  . COLONOSCOPY WITH PROPOFOL N/A 05/30/2014   JYN:WGNFAO  colonic polyp-likely source of hematochezia-removed as described above  . ESOPHAGOGASTRODUODENOSCOPY (EGD) WITH PROPOFOL N/A 05/30/2014   ZOX:WRUEAV and bulbar erosions s/p gastric biopsy. No evidence of portal gastropathy on today's examination.  . FEMORAL-TIBIAL BYPASS GRAFT  2009   Right side using non-reversed GSV   By Dr. Darrick Penna  . FEMORAL-TIBIAL BYPASS GRAFT  11/24/2007   Right femoral to anterior tibial BPG   by Dr. Darrick Penna  . FINGER SURGERY Left    "straightened my pinky"  . HAND TENDON SURGERY Left 2013   Left 5th finger  . HERNIA REPAIR     "stomach"  . ICD IMPLANT N/A 03/02/2018   Procedure: ICD IMPLANT;  Surgeon: Regan Lemming, MD;  Location: [facility] INVAS[facility];  Service: Cardiovascular;  Laterality: N/A;  . INCISIONAL HERNIA REPAIR N/A 12/25/2014   Procedure: HERNIA REPAIR INCISIONAL WITH MESH;  Surgeon: Franky Macho Md, MD;  Location: [facility];   Service: General;  Laterality: N/A;  . INSERTION OF MESH N/A 12/25/2014   Procedure: INSERTION OF MESH;  Surgeon: Franky Macho Md, MD;  Location: [facility];  Service: General;  Laterality: N/A;  . IR IMAGING GUIDED PORT INSERTION  04/11/2018  . LAPAROSCOPIC CHOLECYSTECTOMY    . LOWER EXTREMITY ANGIOGRAM Left 12/30/2015   Procedure: Lower Extremity Angiogram;  Surgeon: Sherren Kerns, MD;  Location[facility] INVASIVE [facility] LAB;  Service: Cardiovascular;  Laterality: Left;  . PERIPHERAL VASCULAR CATHETERIZATION N/A 12/30/2015   Procedure: Abdominal Aortogram;  Surgeon: Sherren Kerns, MD;  Location: [facility] INVAS[facility] CV LAB;  Service: Cardiovascular;  Laterality: N/A;  . POLYPECTOMY  05/30/2014   Procedure: POLYPECTOMY;  Surgeon: Corbin Ade, MD;  Location: [facility];  Service: Endoscopy;;  . TONSILLECTOMY AND ADENOIDECTOMY  ~ 1970  . TYMPANOSTOMY TUBE PLACEMENT Bilateral ~ 1970    REVIEW OF SYSTEMS:  Constitutional: positive for fatigue Eyes: negative Ears, nose, mouth, throat, and face: negative Respiratory: negative Cardiovascular: negative Gastrointestinal: negative Genitourinary:negative Integument/breast: negative Hematologic/lymphatic: negative Musculoskeletal:negative Neurological: negative Behavioral/Psych: negative Endocrine: negative Allergic/Immunologic: negative   PHYSICAL EXAMINATION: General appearance: alert, cooperative and no distress Head: Normocephalic, without obvious abnormality, atraumatic Neck: no adenopathy, no JVD, supple, symmetrical, trachea midline and thyroid not enlarged, symmetric, no tenderness/mass/nodules Lymph nodes: Cervical, supraclavicular, and axillary nodes normal. Resp: clear to auscultation bilaterally Back: symmetric, no curvature. ROM normal. No CVA tenderness. Cardio: regular rate and rhythm, S1, S2 normal, no murmur, click, rub or gallop GI: soft, non-tender; bowel sounds normal; no masses,  no organomegaly Extremities: Right below knee amputation  otherwise no edema. Neurologic: Alert and oriented X 3, normal strength and tone. Normal symmetric reflexes. Normal coordination and gait  ECOG PERFORMANCE STATUS: 1 - Symptomatic but completely ambulatory  Blood pressure 94/65, pulse 64, temperature (!) 97.5 F (36.4 C), temperature source Tympanic, resp. rate 18, height 6\' 1"  (1.854 m), weight 235 lb 14.4 oz (107 kg), SpO2 95 %.  LABORATORY DATA: Lab Results  Component Value Date   WBC 8.4 07/09/2020   HGB 13.7 07/09/2020   HCT 41.9 07/09/2020   MCV 95.7 07/09/2020   PLT 226 07/09/2020      Chemistry      Component Value Date/Time   NA 136 07/09/2020 0935   NA 139 02/27/2018 1457   K 4.3 07/09/2020 0935   CL 98 07/09/2020 0935   CO2 36 (H) 07/09/2020 0935   BUN 8 07/09/2020 0935   BUN 7 02/27/2018 1457   CREATININE 1.04 07/09/2020 0935   CREATININE 1.13 06/28/2016 1453  Component Value Date/Time   CALCIUM 9.2 07/09/2020 0935   ALKPHOS 129 (H) 07/09/2020 0935   AST 17 07/09/2020 0935   ALT 17 07/09/2020 0935   BILITOT 0.3 07/09/2020 0935       RADIOGRAPHIC STUDIES: CT Chest W Contrast  Result Date: 07/07/2020 CLINICAL DATA:  Restaging non-small cell lung cancer. EXAM: CT CHEST WITH CONTRAST TECHNIQUE: Multidetector CT imaging of the chest was performed during intravenous contrast administration. CONTRAST:  18mL OMNIPAQUE IOHEXOL 300 MG/ML  SOLN COMPARISON:  05/06/2020 FINDINGS: Cardiovascular: Normal heart size. No pericardial effusion. Left chest wall ICD is noted with lead in the right ventricle. There is a right chest wall port a catheter with tip at the cavoatrial junction. Mediastinum/Nodes: No significant vascular findings. Normal heart size. No pericardial effusion. Lungs/Pleura: Unchanged appearance masslike architectural distortion with surrounding fibrosis and volume loss within the posterior right upper lobe extending into the right lung apex. This appears stable from the previous exam without specific  findings to suggest local recurrence of disease. Stable 6 mm sub solid nodule in the posterolateral left upper lobe, image 37/7. Central right upper lobe perihilar nodule is unchanged measuring 7 mm, image 50/7. Additional small, nonspecific millimetric lung nodules in the right upper lobe are also stable. No new lung nodules Upper Abdomen: No acute abnormality. Musculoskeletal: No suspicious or acute osseous findings IMPRESSION: 1. Stable appearance of the right upper lobe with post treatment changes compatible with external beam radiation. No specific findings to suggest local recurrence of disease. 2. Unchanged appearance of small solid and sub solid lung nodules. 3. Aortic Atherosclerosis (ICD10-I70.0) and Emphysema (ICD10-J43.9). 4. Coronary artery calcifications. Electronically Signed   By: Kerby Moors M.D.   On: 07/07/2020 13:53    ASSESSMENT AND PLAN: This is a very pleasant 59 years old white male recently diagnosed with a stage IIIa non-small cell lung cancer, adenosquamous carcinoma.  He underwent a course of concurrent chemoradiation with weekly carboplatin and paclitaxel status post 6 cycles with partial response.  He has no actionable mutation but PD-L1 expression is 99%. The patient completed treatment with consolidation immunotherapy with Imfinzi status post 26 cycles.  He tolerated this treatment well with no concerning adverse effects. The patient was on observation for several months but he developed disease progression. He is currently undergoing treatment with systemic immunotherapy with Keytruda 200 mg IV every 3 weeks status post 6 cycles.  The patient continues to tolerate his treatment with [facility] fairly well. He had repeat CT scan of the chest performed recently.  I personally and independently reviewed the scans and discussed the results with the patient and his wife today. His scan showed no concerning findings for disease progression. I recommended for him to continue his  current treatment with Keytruda with the same dose. For the hypothyroidism, he will continue his current treatment with levothyroxine. The patient will come back for follow-up visit in 3 weeks for evaluation before cycle #8. He was advised to call immediately if he has any concerning symptoms in the interval. The patient voices understanding of current disease status and treatment options and is in agreement with the current care plan. All questions were answered. The patient knows to call the clinic with any problems, questions or concerns. We can certainly see the patient much sooner if necessary.   Disclaimer: This note was dictated with voice recognition software. Similar sounding words can inadvertently be transcribed and may not be corrected upon review.

## 2020-07-09 NOTE — Patient Instructions (Signed)

## 2020-07-09 NOTE — Patient Instructions (Signed)
[facility] Discharge Instructions for Patients Receiving Chemotherapy  Today you received the following chemotherapy agents:  Keytruda.  To help prevent nausea and vomiting after your treatment, we encourage you to take your nausea medication as directed.   If you develop nausea and vomiting that is not controlled by your nausea medication, call the clinic.   BELOW ARE SYMPTOMS THAT SHOULD BE REPORTED IMMEDIATELY:  *FEVER GREATER THAN 100.5 F  *CHILLS WITH OR WITHOUT FEVER  NAUSEA AND VOMITING THAT IS NOT CONTROLLED WITH YOUR NAUSEA MEDICATION  *UNUSUAL SHORTNESS OF BREATH  *UNUSUAL BRUISING OR BLEEDING  TENDERNESS IN MOUTH AND THROAT WITH OR WITHOUT PRESENCE OF ULCERS  *URINARY PROBLEMS  *BOWEL PROBLEMS  UNUSUAL RASH Items with * indicate a potential emergency and should be followed up as soon as possible.  Feel free to call the clinic should you have any questions or concerns. The clinic phone number is [phone].  Please show the CHEMO ALERT CARD at check-in to the Emergency Department and triage nurse.    

## 2020-07-10 ENCOUNTER — Telehealth: Payer: Self-pay | Admitting: Internal Medicine

## 2020-07-10 NOTE — Telephone Encounter (Signed)
Added additional cycles per 10/13 los - pt to get an updated schedule next visit.

## 2020-07-14 ENCOUNTER — Ambulatory Visit: Payer: Medicare Other | Admitting: Cardiovascular Disease

## 2020-07-14 ENCOUNTER — Encounter: Payer: Self-pay | Admitting: Cardiology

## 2020-07-14 ENCOUNTER — Ambulatory Visit (INDEPENDENT_AMBULATORY_CARE_PROVIDER_SITE_OTHER): Payer: Medicare Other | Admitting: Cardiology

## 2020-07-14 ENCOUNTER — Other Ambulatory Visit: Payer: Self-pay

## 2020-07-14 VITALS — BP 108/78 | HR 85 | Ht 72.0 in | Wt 238.2 lb

## 2020-07-14 DIAGNOSIS — I255 Ischemic cardiomyopathy: Secondary | ICD-10-CM

## 2020-07-14 DIAGNOSIS — Z95810 Presence of automatic (implantable) cardiac defibrillator: Secondary | ICD-10-CM | POA: Diagnosis not present

## 2020-07-14 NOTE — Progress Notes (Signed)
Electrophysiology Office Note   Date:  07/14/2020   ID:  [name], [name] 10-07-1960, MRN [id number]  PCP:  Redmond School, MD  Cardiologist:  Oval Linsey Primary Electrophysiologist:  Mark Pullen Meredith Leeds, MD    No chief complaint on file.    History of Present Illness: [name] is a 59 y.o. male who is being seen today for the evaluation of ischemic cardiomyopathy at the request of [name]. Presenting today for electrophysiology evaluation.    He has a history of coronary artery disease status post anterior STEMI, chronic systolic and diastolic heart failure with an ejection fraction of 20 to 25%, peripheral arterial disease status post right femoral tibial bypass followed by BKA, tobacco use, hepatitis C post treatment.  He had an anterior STEMI 05/26/2016 with occluded LAD treated with drug-eluting stent.  Echo showed reduced ejection fraction and he is now status post Medtronic ICD implanted 03/02/2018.  Postoperative chest x-ray showed a left upper lobe mass which was subsequently diagnosed with stage IIIa squamous cell lung cancer.   Today, denies symptoms of palpitations, chest pain, shortness of breath, orthopnea, PND, lower extremity edema, claudication, dizziness, presyncope, syncope, bleeding, or neurologic sequela. The patient is tolerating medications without difficulties.     Past Medical History:  Diagnosis Date  . Acute hepatitis C virus infection 06/19/2007   Qualifier: Diagnosis of  By: Leilani Merl CMA, Tiffany    . Acute ST elevation myocardial infarction (STEMI) involving left anterior descending (LAD) coronary artery ([place]) 05/26/2016  . Acute ST elevation myocardial infarction (STEMI) of lateral wall ([place]) 05/26/2016  . 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 Anxiety   . Asthma   . Atherosclerosis of native arteries of the extremities with intermittent claudication 12/16/2011  . Chronic hepatitis C without hepatic coma ([place]) 05/03/2014  .  COPD with chronic bronchitis and emphysema ([place]) 03/13/2018   FEV1 57%  . Depression   . DVT (deep venous thrombosis) ([place]) 2009; 2019   RLE; LLE  . Encounter for antineoplastic chemotherapy 03/22/2018  . Hepatitis C    "tx'd in 2015"  . High cholesterol   . Ischemic cardiomyopathy 03/02/2018  . Non-small cell carcinoma of right lung, stage 3 ([place]) 03/22/2018  . PAD (peripheral artery disease) ([place]) 01/25/2013  . Peripheral vascular disease, unspecified 07/20/2012  . Port-A-Cath in place 04/24/2018  . ST elevation myocardial infarction involving left anterior descending (LAD) coronary artery ([place])   . STEMI (ST elevation myocardial infarction) ([employer]) 05/26/2016  . Tobacco abuse 01/28/2016  . Tubular adenoma of colon 07/11/2014  . Urinary dribbling    Past Surgical History:  Procedure Laterality Date  . AORTOGRAM  08/08/2009   for LLE claudication     By Dr. Oneida Alar  . 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 BIOPSY N/A 05/30/2014   Procedure: BIOPSY;  Surgeon: [name], MD;  Location: [facility];  Service: Endoscopy;  Laterality: N/A;  . BLADDER TUMOR EXCISION  8/812  . CARDIAC CATHETERIZATION N/A 05/26/2016   Procedure: Left Heart Cath and Coronary Angiography;  Surgeon: [name], MD;  Location: [facility];  Service: Cardiovascular;  Laterality: N/A;  . CARDIAC CATHETERIZATION N/A 05/26/2016   Procedure: Coronary Stent Intervention;  Surgeon: [name], MD;  Location: [facility];  Service: Cardiovascular;  Laterality: N/A;  . COLONOSCOPY WITH PROPOFOL N/A 05/30/2014   TWS:FKCLEX colonic polyp-likely source of hematochezia-removed as described above  . ESOPHAGOGASTRODUODENOSCOPY (EGD) WITH PROPOFOL N/A 05/30/2014  DXI:PJASNK and bulbar erosions s/p gastric biopsy. No evidence of portal gastropathy on today's examination.  . FEMORAL-TIBIAL BYPASS GRAFT  2009   Right side using non-reversed GSV   By Dr. Oneida Alar  . FEMORAL-TIBIAL BYPASS GRAFT  11/24/2007   Right  femoral to anterior tibial BPG   by Dr. Oneida Alar  . FINGER SURGERY Left    "straightened my pinky"  . HAND TENDON SURGERY Left 2013   Left 5th finger  . HERNIA REPAIR     "stomach"  . ICD IMPLANT N/A 03/02/2018   Procedure: ICD IMPLANT;  Surgeon: [name], MD;  Location: [facility];  Service: Cardiovascular;  Laterality: N/A;  . INCISIONAL HERNIA REPAIR N/A 12/25/2014   Procedure: HERNIA REPAIR INCISIONAL WITH MESH;  Surgeon: [name], MD;  Location: [facility];  Service: General;  Laterality: N/A;  . INSERTION OF MESH N/A 12/25/2014   Procedure: INSERTION OF MESH;  Surgeon: [name], MD;  Location: [facility];  Service: General;  Laterality: N/A;  . IR IMAGING GUIDED PORT INSERTION  04/11/2018  . LAPAROSCOPIC CHOLECYSTECTOMY    . LOWER EXTREMITY ANGIOGRAM Left 12/30/2015   Procedure: Lower Extremity Angiogram;  Surgeon: [name], MD;  Location: [facility];  Service: Cardiovascular;  Laterality: Left;  . PERIPHERAL VASCULAR CATHETERIZATION N/A 12/30/2015   Procedure: Abdominal Aortogram;  Surgeon: [name], MD;  Location: [facility];  Service: Cardiovascular;  Laterality: N/A;  . POLYPECTOMY  05/30/2014   Procedure: POLYPECTOMY;  Surgeon: [name], MD;  Location: [facility];  Service: Endoscopy;;  . TONSILLECTOMY AND ADENOIDECTOMY  ~ 1970  . TYMPANOSTOMY TUBE PLACEMENT Bilateral ~ 1970     Current Outpatient Medications  Medication Sig Dispense Refill  . albuterol (VENTOLIN HFA) 108 (90 Base) MCG/ACT inhaler SMARTSIG:1-2 Puff(s) Via Inhaler Every 4 Hours PRN    . alprazolam (XANAX) 2 MG tablet Take 2 mg by mouth 4 (four) times daily as needed for anxiety.     [name] amitriptyline (ELAVIL) 100 MG tablet Take 100 mg by mouth at bedtime.     [name] aspirin 81 MG EC tablet Take 81 mg by mouth daily.      [name] atorvastatin (LIPITOR) 80 MG tablet Take 1 tablet (80 mg total) by mouth daily at 6 PM. 30 tablet 12  . carvedilol (COREG) 6.25 MG tablet TAKE 1  TABLET (6.25 MG TOTAL) BY MOUTH 2 (TWO) TIMES DAILY WITH A MEAL. OFFICE VISIT NEEDED 180 tablet 2  . citalopram (CELEXA) 40 MG tablet Take 40 mg by mouth daily.  11  . furosemide (LASIX) 20 MG tablet Take 1 tablet (20 mg total) by mouth daily. Please make overdue appt with Dr. Oval Linsey before anymore refills. 2nd attempt 15 tablet 0  . HYDROcodone-acetaminophen (NORCO) 10-325 MG tablet Take 1 tablet by mouth every 4 (four) hours as needed for moderate pain.   0  . levothyroxine (SYNTHROID) 125 MCG tablet TAKE 1 TABLET BY MOUTH EVERY DAY 30 tablet 2  . lidocaine-prilocaine (EMLA) cream Apply 1 application topically as needed. 30 g 0  . lisinopril (PRINIVIL,ZESTRIL) 2.5 MG tablet Take 1 tablet (2.5 mg total) by mouth daily. 30 tablet 12  . nitroGLYCERIN (NITROSTAT) 0.4 MG SL tablet Place 1 tablet (0.4 mg total) under the tongue every 5 (five) minutes x 3 doses as needed for chest pain. 25 tablet 2  . oxyCODONE-acetaminophen (PERCOCET) 10-325 MG tablet Take 1 tablet by mouth every 4 (four) hours as needed.    [name]  prochlorperazine (COMPAZINE) 10 MG tablet Take 1 tablet (10 mg total) by mouth every 6 (six) hours as needed for nausea or vomiting. 30 tablet 0  . sildenafil (VIAGRA) 25 MG tablet TAKE 1 TABLET BY MOUTH 60UMINUTES PRIOR TOEENCOUNTER.S    . spironolactone (ALDACTONE) 25 MG tablet TAKE 1 TABLET BY MOUTH EVERY DAY 90 tablet 0  . sucralfate (CARAFATE) 1 g tablet Take 1 tablet (1 g total) by mouth 4 (four) times daily -  with meals and at bedtime. 5 min before meals for radiation induced esophagitis 120 tablet 2  . tamsulosin (FLOMAX) 0.4 MG CAPS capsule Take 1 capsule by mouth daily.    . Tiotropium Bromide-Olodaterol (STIOLTO RESPIMAT) 2.5-2.5 MCG/ACT AERS Inhale 2 puffs into the lungs daily. 1 Inhaler 5  . umeclidinium-vilanterol (ANORO ELLIPTA) 62.5-25 MCG/INH AEPB Inhale 1 puff into the lungs daily. 1 each 0   No current facility-administered medications for this visit.    Facility-Administered Medications Ordered in Other Visits  Medication Dose Route Frequency Provider Last Rate Last Admin  . sodium chloride flush (NS) 0.9 % injection 10 mL  10 mL Intracatheter PRN Curt Bears, MD   10 mL at 03/19/20 1544    Allergies:   Lyrica [pregabalin] and Neurontin [gabapentin]   Social History:  The patient  reports that he has been smoking cigarettes. He has a 15.50 pack-year smoking history. He has never used smokeless tobacco. He reports current alcohol use of about 18.0 standard drinks of alcohol per week. He reports current drug use. Drug: Marijuana.   Family History:  The patient's family history includes Ulcers in his father; Vision loss in his mother.   ROS:  Please see the history of present illness.   Otherwise, review of systems is positive for none.   All other systems are reviewed and negative.   PHYSICAL EXAM: VS:  BP 108/78   Pulse 85   Ht 6' (1.829 m)   Wt 238 lb 3.2 oz (108 kg)   SpO2 94%   BMI 32.31 kg/m  , BMI Body mass index is 32.31 kg/m. GEN: Well nourished, well developed, in no acute distress  HEENT: normal  Neck: no JVD, carotid bruits, or masses Cardiac: RRR; no murmurs, rubs, or gallops,no edema  Respiratory:  clear to auscultation bilaterally, normal work of breathing GI: soft, nontender, nondistended, + BS MS: no deformity or atrophy  Skin: warm and dry, device site well healed Neuro:  Strength and sensation are intact Psych: euthymic mood, full affect  EKG:  EKG is ordered today. Personal review of the ekg ordered shows sinus rhythm heart rate 85  Personal review of the device interrogation today. Results in [facility]: 07/09/2020: ALT 17; BUN 8; Creatinine 1.04; Hemoglobin 13.7; Platelet Count 226; Potassium 4.3; Sodium 136; TSH 16.920    Lipid Panel     Component Value Date/Time   CHOL 93 (L) 01/31/2018 1124   TRIG 81 01/31/2018 1124   HDL 39 (L) 01/31/2018 1124   CHOLHDL 2.4 01/31/2018 1124    CHOLHDL 3.5 05/26/2016 0459   VLDL 35 05/26/2016 0459   LDLCALC 38 01/31/2018 1124     Wt Readings from Last 3 Encounters:  07/14/20 238 lb 3.2 oz (108 kg)  07/09/20 235 lb 14.4 oz (107 kg)  06/18/20 240 lb 14.4 oz (109.3 kg)      Other studies Reviewed: Additional studies/ records that were reviewed today include: TTE 02/07/18 Review of the above records today demonstrates:  - Left  ventricle: Poorly visualized. The cavity size was mildly   dilated. Systolic function was severely reduced. The estimated   ejection fraction was in the range of 20% to 25%. Severe diffuse   hypokinesis with distinct regional wall motion abnormalities.   There is akinesis of the apicalanterior, lateral, and apical   myocardium. Features are consistent with a pseudonormal left   ventricular filling pattern, with concomitant abnormal relaxation   and increased filling pressure (grade 2 diastolic dysfunction). - Left atrium: The atrium was mildly dilated.  LHC 05/26/16:  A STENT XIENCE ALPINE RX 3.5X18 drug eluting stent was successfully placed, and does not overlap previously placed stent.  Prox LAD lesion, 100 %stenosed.  Post intervention, there is a 0% residual stenosis.  LV end diastolic pressure is mildly elevated.  There is mild to moderate left ventricular systolic dysfunction.  ASSESSMENT AND PLAN:  1.  Ischemic cardiomyopathy: Currently on optimal medical therapy.  Status post Medtronic ICD implanted 03/02/2018.  Device functioning appropriately.  No changes.    2.  Coronary artery disease: Status post anterior STEMI: Drug-eluting stent in the LAD.  Aspirin and Plavix.    3.  Peripheral arterial disease: Currently on aspirin and statin.  Smoking cessation encouraged.  Has follow-up with vascular surgery.  4.  Tobacco abuse: Complete cessation encouraged  5.  Stage IIIa non-small cell lung cancer: Plan per oncology.    Current medicines are reviewed at length with the patient today.     The patient does not have concerns regarding his medicines.  The following changes were made today: none  Labs/ tests ordered today include:  Orders Placed This Encounter  Procedures  . EKG 12-Lead    Disposition:   FU with Onia Shiflett 12 months  Signed, Sagan Wurzel Meredith Leeds, MD  07/14/2020 4:25 PM     [address] [place] [address] [phone] (office) [phone] (fax)

## 2020-07-26 DIAGNOSIS — J449 Chronic obstructive pulmonary disease, unspecified: Secondary | ICD-10-CM | POA: Diagnosis not present

## 2020-07-26 DIAGNOSIS — E063 Autoimmune thyroiditis: Secondary | ICD-10-CM | POA: Diagnosis not present

## 2020-07-26 DIAGNOSIS — Z720 Tobacco use: Secondary | ICD-10-CM | POA: Diagnosis not present

## 2020-07-26 DIAGNOSIS — I10 Essential (primary) hypertension: Secondary | ICD-10-CM | POA: Diagnosis not present

## 2020-07-28 DIAGNOSIS — I10 Essential (primary) hypertension: Secondary | ICD-10-CM | POA: Diagnosis not present

## 2020-07-28 DIAGNOSIS — G894 Chronic pain syndrome: Secondary | ICD-10-CM | POA: Diagnosis not present

## 2020-07-31 ENCOUNTER — Other Ambulatory Visit: Payer: Medicare Other

## 2020-07-31 ENCOUNTER — Ambulatory Visit: Payer: Medicare Other

## 2020-07-31 ENCOUNTER — Ambulatory Visit: Payer: Medicare Other | Admitting: Internal Medicine

## 2020-08-08 NOTE — Progress Notes (Signed)
[facility] OFFICE PROGRESS NOTE  Redmond School, MD [address] [place] [address]  DIAGNOSIS: Recurrent lung cancer initially diagnosed as stage IIIA (T3, N2, M0) non-small cell lung cancer, adenosquamous carcinoma diagnosed in June 2019 and presented with large right upper lobe lung mass with questionable chest wall invasion as well as right hilar and mediastinal lymphadenopathy.  Biomarker Findings Tumor Mutational Burden - TMB-High (21 Muts/Mb) Microsatellite status - MS-Stable Genomic Findings For a complete list of the genes assayed, please refer to the Appendix. ATM X7262* CCND2 P281R KRAS G12V CHEK2 T370f*15 TP53 E298* 7 Disease relevant genes with no reportable alterations: ALK, EGFR, BRAF, MET, RET, ERBB2, ROS1  PDL1 Expression: 99%  PRIOR THERAPY: 1) Concurrent chemoradiation with chemotherapy consisting of weekly carboplatin for an AUC of 2 and paclitaxel 45 mg/m2. First dose given on 04/03/2018.Status post 6 cycles. Last dose was giving 05/15/2018 with partial response. 2) Consolidation treatment with immunotherapy with Imfinzi (Durvalumab) 10 mg/KG every 2 weeks. First dose 06/20/2018. Status post 26 cycles.  CURRENT THERAPY: Systemic treatment with immunotherapy with Keytruda 200 mg IV every 3 weeks. First dose February 27, 2020.Status post7cycles.  INTERVAL HISTORY: SGRAYCEN SADLON89y.o. male returns to the clinic for a follow up visit. The patient is feeling well today without any concerning complaints. The patienthas been tolerating his treatment with immunotherapy well with out any adverse side effects.Denies any fever, chills, night sweats, or weight loss. Denies any chest pain or hemoptysis.He reports his baseline shortness of breath and cough but denies any chest pain or hemoptysis. He has wheezing at night and uses an inhaler.The patient continues to smoke cigarettes which contributes to his respiratory complaints.He smokes  about 10 cigarettes per day.Denies any nausea, vomiting,ordiarrhea.Denies any headache or visual changes. Denies any rashes or skin changes except is starting to develop a pressure ulcer on his buttocks. He reports his baseline constipation for which he takes over-the-counter stool softener daily whichreportedly works well for him. The patient is here today for evaluationprior to starting cycle #8   MEDICAL HISTORY: Past Medical History:  Diagnosis Date  . Acute hepatitis C virus infection 06/19/2007   Qualifier: Diagnosis of  By: MLeilani MerlCMA, Tiffany    . Acute ST elevation myocardial infarction (STEMI) involving left anterior descending (LAD) coronary artery ([facility] 05/26/2016  . Acute ST elevation myocardial infarction (STEMI) of lateral wall ([facility] 05/26/2016  . 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 Anxiety   . Asthma   . Atherosclerosis of native arteries of the extremities with intermittent claudication 12/16/2011  . Chronic hepatitis C without hepatic coma ([facility] 05/03/2014  . COPD with chronic bronchitis and emphysema ([facility] 03/13/2018   FEV1 57%  . Depression   . DVT (deep venous thrombosis) ([facility] 2009; 2019   RLE; LLE  . Encounter for antineoplastic chemotherapy 03/22/2018  . Hepatitis C    "tx'd in 2015"  . High cholesterol   . Ischemic cardiomyopathy 03/02/2018  . Non-small cell carcinoma of right lung, stage 3 ([facility] 03/22/2018  . PAD (peripheral artery disease) ([facility] 01/25/2013  . Peripheral vascular disease, unspecified 07/20/2012  . Port-A-Cath in place 04/24/2018  . ST elevation myocardial infarction involving left anterior descending (LAD) coronary artery ([facility]   . STEMI (ST elevation myocardial infarction) ([facility] 05/26/2016  . Tobacco abuse 01/28/2016  . Tubular adenoma of colon 07/11/2014  . Urinary dribbling     ALLERGIES:  is allergic to lyrica [pregabalin] and neurontin [gabapentin].  MEDICATIONS:  Current Outpatient Medications  Medication Sig  Dispense Refill  . albuterol (VENTOLIN HFA) 108 (90 Base) MCG/ACT inhaler SMARTSIG:1-2 Puff(s) Via Inhaler Every 4 Hours PRN    . alprazolam (XANAX) 2 MG tablet Take 2 mg by mouth 4 (four) times daily as needed for anxiety.     [name] amitriptyline (ELAVIL) 100 MG tablet Take 100 mg by mouth at bedtime.     [name] aspirin 81 MG EC tablet Take 81 mg by mouth daily.      [name] atorvastatin (LIPITOR) 80 MG tablet Take 1 tablet (80 mg total) by mouth daily at 6 PM. 30 tablet 12  . carvedilol (COREG) 6.25 MG tablet TAKE 1 TABLET (6.25 MG TOTAL) BY MOUTH 2 (TWO) TIMES DAILY WITH A MEAL. OFFICE VISIT NEEDED 180 tablet 2  . citalopram (CELEXA) 40 MG tablet Take 40 mg by mouth daily.  11  . furosemide (LASIX) 20 MG tablet Take 1 tablet (20 mg total) by mouth daily. Please make overdue appt with Dr. Oval Linsey before anymore refills. 2nd attempt 15 tablet 0  . HYDROcodone-acetaminophen (NORCO) 10-325 MG tablet Take 1 tablet by mouth every 4 (four) hours as needed for moderate pain.   0  . levothyroxine (SYNTHROID) 125 MCG tablet TAKE 1 TABLET BY MOUTH EVERY DAY 30 tablet 2  . lidocaine-prilocaine (EMLA) cream Apply 1 application topically as needed. 30 g 0  . lisinopril (PRINIVIL,ZESTRIL) 2.5 MG tablet Take 1 tablet (2.5 mg total) by mouth daily. 30 tablet 12  . nitroGLYCERIN (NITROSTAT) 0.4 MG SL tablet Place 1 tablet (0.4 mg total) under the tongue every 5 (five) minutes x 3 doses as needed for chest pain. 25 tablet 2  . oxyCODONE-acetaminophen (PERCOCET) 10-325 MG tablet Take 1 tablet by mouth every 4 (four) hours as needed.    . prochlorperazine (COMPAZINE) 10 MG tablet Take 1 tablet (10 mg total) by mouth every 6 (six) hours as needed for nausea or vomiting. 30 tablet 0  . sildenafil (VIAGRA) 25 MG tablet TAKE 1 TABLET BY MOUTH 60UMINUTES PRIOR TOEENCOUNTER.S    . spironolactone (ALDACTONE) 25 MG tablet TAKE 1 TABLET BY MOUTH EVERY DAY 90 tablet 0  . sucralfate (CARAFATE) 1 g tablet Take 1 tablet (1 g total) by mouth 4  (four) times daily -  with meals and at bedtime. 5 min before meals for radiation induced esophagitis 120 tablet 2  . tamsulosin (FLOMAX) 0.4 MG CAPS capsule Take 1 capsule by mouth daily.    . Tiotropium Bromide-Olodaterol (STIOLTO RESPIMAT) 2.5-2.5 MCG/ACT AERS Inhale 2 puffs into the lungs daily. 1 Inhaler 5  . umeclidinium-vilanterol (ANORO ELLIPTA) 62.5-25 MCG/INH AEPB Inhale 1 puff into the lungs daily. 1 each 0   No current facility-administered medications for this visit.   Facility-Administered Medications Ordered in Other Visits  Medication Dose Route Frequency Provider Last Rate Last Admin  . 0.9 %  sodium chloride infusion   Intravenous Once Curt Bears, MD      . heparin lock flush 100 unit/mL  500 Units Intracatheter Once PRN Curt Bears, MD      . pembrolizumab [facility]) 200 mg in sodium chloride 0.9 % 50 mL chemo infusion  200 mg Intravenous Once Curt Bears, MD      . sodium chloride flush (NS) 0.9 % injection 10 mL  10 mL Intracatheter PRN Curt Bears, MD   10 mL at 03/19/20 1544  . sodium chloride flush (NS) 0.9 % injection 10 mL  10 mL Intracatheter PRN Curt Bears, MD  SURGICAL HISTORY:  Past Surgical History:  Procedure Laterality Date  . AORTOGRAM  08/08/2009   for LLE claudication     By Dr. Oneida Alar  . 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 BIOPSY N/A 05/30/2014   Procedure: BIOPSY;  Surgeon: [name], MD;  Location: [facility];  Service: Endoscopy;  Laterality: N/A;  . BLADDER TUMOR EXCISION  8/812  . CARDIAC CATHETERIZATION N/A 05/26/2016   Procedure: Left Heart Cath and Coronary Angiography;  Surgeon: [name], MD;  Location: [facility];  Service: Cardiovascular;  Laterality: N/A;  . CARDIAC CATHETERIZATION N/A 05/26/2016   Procedure: Coronary Stent Intervention;  Surgeon: [name], MD;  Location: [facility];  Service: Cardiovascular;  Laterality: N/A;  . COLONOSCOPY WITH PROPOFOL N/A 05/30/2014    WUX:LKGMWN colonic polyp-likely source of hematochezia-removed as described above  . ESOPHAGOGASTRODUODENOSCOPY (EGD) WITH PROPOFOL N/A 05/30/2014   UUV:OZDGUY and bulbar erosions s/p gastric biopsy. No evidence of portal gastropathy on today's examination.  . FEMORAL-TIBIAL BYPASS GRAFT  2009   Right side using non-reversed GSV   By Dr. Oneida Alar  . FEMORAL-TIBIAL BYPASS GRAFT  11/24/2007   Right femoral to anterior tibial BPG   by Dr. Oneida Alar  . FINGER SURGERY Left    "straightened my pinky"  . HAND TENDON SURGERY Left 2013   Left 5th finger  . HERNIA REPAIR     "stomach"  . ICD IMPLANT N/A 03/02/2018   Procedure: ICD IMPLANT;  Surgeon: [name], MD;  Location: [facility];  Service: Cardiovascular;  Laterality: N/A;  . INCISIONAL HERNIA REPAIR N/A 12/25/2014   Procedure: HERNIA REPAIR INCISIONAL WITH MESH;  Surgeon: [name], MD;  Location: [facility];  Service: General;  Laterality: N/A;  . INSERTION OF MESH N/A 12/25/2014   Procedure: INSERTION OF MESH;  Surgeon: [name], MD;  Location: [facility];  Service: General;  Laterality: N/A;  . IR IMAGING GUIDED PORT INSERTION  04/11/2018  . LAPAROSCOPIC CHOLECYSTECTOMY    . LOWER EXTREMITY ANGIOGRAM Left 12/30/2015   Procedure: Lower Extremity Angiogram;  Surgeon: [name], MD;  Location: [facility];  Service: Cardiovascular;  Laterality: Left;  . PERIPHERAL VASCULAR CATHETERIZATION N/A 12/30/2015   Procedure: Abdominal Aortogram;  Surgeon: [name], MD;  Location: [facility];  Service: Cardiovascular;  Laterality: N/A;  . POLYPECTOMY  05/30/2014   Procedure: POLYPECTOMY;  Surgeon: [name], MD;  Location: [facility];  Service: Endoscopy;;  . TONSILLECTOMY AND ADENOIDECTOMY  ~ 1970  . TYMPANOSTOMY TUBE PLACEMENT Bilateral ~ 1970    REVIEW OF SYSTEMS:   REVIEW OF SYSTEMS:   Constitutional: Negative for appetite change, chills, fatigue, fever and unexpected weight change.  HENT: Negative for  mouth sores, nosebleeds, sore throat and trouble swallowing.  Eyes: Negative for eye problems and icterus.  Respiratory:Positive for baseline cough and shortness of breath.Negative for hemoptysis and wheezing.  Cardiovascular: Negative for chest pain and leg swelling.  Gastrointestinal: Negative for abdominal pain, constipation, diarrhea, nausea and vomiting.  Genitourinary: Negative for bladder incontinence, difficulty urinating, dysuria, frequency and hematuria.  Musculoskeletal: Negative for back pain, gait problem, neck pain and neck stiffness.  Skin: Positive for erythema on the right buttock and skin breakdown.  Neurological: Negative for dizziness, extremity weakness, gait problem, headaches, light-headedness and seizures.  Hematological: Negative for adenopathy. Does not bruise/bleed easily.  Psychiatric/Behavioral: Negative for confusion, depression and sleep disturbance. The patient is not nervous/anxious.  PHYSICAL EXAMINATION:  Blood pressure 103/60, pulse 72, temperature (!) 97.2 F (36.2 C), temperature source Tympanic, resp. rate 16, height 6' (1.829 m), SpO2 98 %.  ECOG PERFORMANCE STATUS: 2 - Symptomatic, <50% confined to bed  Physical Exam  Constitutional: Oriented to person, place, and time and well-developed, well-nourished, and in no distress.  HENT:  Head: Normocephalic and atraumatic.  Mouth/Throat: Oropharynx is clear and moist. No oropharyngeal exudate.  Eyes: Conjunctivae are normal. Right eye exhibits no discharge. Left eye exhibits no discharge. No scleral icterus.  Neck: Normal range of motion. Neck supple.  Cardiovascular: Normal rate, regular rhythm, normal heart sounds and intact distal pulses.  Pulmonary/Chest:Effort normal and breath sounds normal. No respiratory distress. No rales.  Abdominal: Soft. Bowel sounds are normal. Exhibits no distension and no mass. There is no tenderness.  Musculoskeletal: BKA noted on the right leg.Normal range of  motion. Exhibits no edema.  Lymphadenopathy:  No cervical adenopathy.  Neurological: Alert and oriented to person, place, and time. Exhibits normal muscle tone. Gait normal. Coordination normal.  Skin: Stage II pressure ulcer on right buttock. Skin is warm and dry. No rash noted. Not diaphoretic. No erythema. No pallor.  Psychiatric: Mood, memory and judgment normal.  Vitals reviewed.  LABORATORY DATA: Lab Results  Component Value Date   WBC 9.4 08/11/2020   HGB 13.8 08/11/2020   HCT 42.1 08/11/2020   MCV 94.8 08/11/2020   PLT 248 08/11/2020      Chemistry      Component Value Date/Time   NA 135 08/11/2020 1430   NA 139 02/27/2018 1457   K 4.2 08/11/2020 1430   CL 96 (L) 08/11/2020 1430   CO2 31 08/11/2020 1430   BUN 10 08/11/2020 1430   BUN 7 02/27/2018 1457   CREATININE 1.02 08/11/2020 1430   CREATININE 1.13 06/28/2016 1453      Component Value Date/Time   CALCIUM 8.7 (L) 08/11/2020 1430   ALKPHOS 127 (H) 08/11/2020 1430   AST 22 08/11/2020 1430   ALT 23 08/11/2020 1430   BILITOT 0.5 08/11/2020 1430       RADIOGRAPHIC STUDIES:  No results found.   ASSESSMENT/PLAN:  This is a very pleasant 59 year old Caucasian male with stage IIIa non-small cell lung cancer, adenosquamous carcinoma. He presented with a large right upper lobe lung mass with questionable chest wall invasion as well as a right hilar and mediastinal lymphadenopathy. He was diagnosed in June 2019.His PDL1 expression is 99%  He completed 6 cycles of concurrent chemoradiation with carboplatin and paclitaxel. He had a partial response to treatment.  The patient is currently undergoing consolidation immunotherapy with Imfinzi 10 mg/kg IV every 2 weeks. He is status post26cycles. He completed this on10/03/2019  He was on observation until he showed evidence of local recurrence.   He is currently undergoing systemic chemotherapy with Keytruda 200 mg IV every 3 weeks. He is status  post7cycles.He tolerated this well without any concerning complains  Labs were reviewed. Recommend that he proceed with cycle #8 today as scheduled.   We will see him back for a follow up visit in 3 weeks for evaluation before starting cycle #9.   He will continue to use his stool softener for constipation.   The patient knows that we strongly encourage smoking cessation. He is not interested in quitting at this time.  We will monitor his TSH closely and make adjustments as necessary. TSH pending at this time.  Regarding the wound on his right buttock. Discussed that  he should change positions every 2 hours. 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   The patient was advised to call immediately if he has any concerning symptoms in the interval. The patient voices understanding of current disease status and treatment options and is in agreement with the current care plan. All questions were answered. The patient knows to call the clinic with any problems, questions or concerns. We can certainly see the patient much sooner if necessary  No orders of the defined types were placed in this encounter.    Shuayb Schepers L Verenis Nicosia, PA-C 08/11/20

## 2020-08-11 ENCOUNTER — Inpatient Hospital Stay (HOSPITAL_BASED_OUTPATIENT_CLINIC_OR_DEPARTMENT_OTHER): Payer: Medicare Other | Admitting: Physician Assistant

## 2020-08-11 ENCOUNTER — Inpatient Hospital Stay: Payer: Medicare Other

## 2020-08-11 ENCOUNTER — Inpatient Hospital Stay: Payer: Medicare Other | Attending: Internal Medicine

## 2020-08-11 ENCOUNTER — Encounter: Payer: Self-pay | Admitting: Physician Assistant

## 2020-08-11 ENCOUNTER — Other Ambulatory Visit: Payer: Self-pay

## 2020-08-11 VITALS — BP 103/60 | HR 72 | Temp 97.2°F | Resp 16 | Ht 72.0 in

## 2020-08-11 DIAGNOSIS — E7800 Pure hypercholesterolemia, unspecified: Secondary | ICD-10-CM | POA: Insufficient documentation

## 2020-08-11 DIAGNOSIS — K5900 Constipation, unspecified: Secondary | ICD-10-CM | POA: Insufficient documentation

## 2020-08-11 DIAGNOSIS — C3411 Malignant neoplasm of upper lobe, right bronchus or lung: Secondary | ICD-10-CM | POA: Insufficient documentation

## 2020-08-11 DIAGNOSIS — Z9221 Personal history of antineoplastic chemotherapy: Secondary | ICD-10-CM | POA: Insufficient documentation

## 2020-08-11 DIAGNOSIS — I252 Old myocardial infarction: Secondary | ICD-10-CM | POA: Insufficient documentation

## 2020-08-11 DIAGNOSIS — Z5112 Encounter for antineoplastic immunotherapy: Secondary | ICD-10-CM

## 2020-08-11 DIAGNOSIS — C3491 Malignant neoplasm of unspecified part of right bronchus or lung: Secondary | ICD-10-CM

## 2020-08-11 DIAGNOSIS — Z79899 Other long term (current) drug therapy: Secondary | ICD-10-CM | POA: Insufficient documentation

## 2020-08-11 DIAGNOSIS — B182 Chronic viral hepatitis C: Secondary | ICD-10-CM | POA: Diagnosis not present

## 2020-08-11 DIAGNOSIS — I255 Ischemic cardiomyopathy: Secondary | ICD-10-CM | POA: Insufficient documentation

## 2020-08-11 DIAGNOSIS — Z7982 Long term (current) use of aspirin: Secondary | ICD-10-CM | POA: Diagnosis not present

## 2020-08-11 DIAGNOSIS — F329 Major depressive disorder, single episode, unspecified: Secondary | ICD-10-CM | POA: Diagnosis not present

## 2020-08-11 DIAGNOSIS — F17210 Nicotine dependence, cigarettes, uncomplicated: Secondary | ICD-10-CM | POA: Insufficient documentation

## 2020-08-11 DIAGNOSIS — Z95828 Presence of other vascular implants and grafts: Secondary | ICD-10-CM

## 2020-08-11 DIAGNOSIS — Z86718 Personal history of other venous thrombosis and embolism: Secondary | ICD-10-CM | POA: Insufficient documentation

## 2020-08-11 DIAGNOSIS — F419 Anxiety disorder, unspecified: Secondary | ICD-10-CM | POA: Diagnosis not present

## 2020-08-11 DIAGNOSIS — Z923 Personal history of irradiation: Secondary | ICD-10-CM | POA: Diagnosis not present

## 2020-08-11 DIAGNOSIS — J449 Chronic obstructive pulmonary disease, unspecified: Secondary | ICD-10-CM | POA: Insufficient documentation

## 2020-08-11 LAB — CBC WITH DIFFERENTIAL (CANCER CENTER ONLY)
Abs Immature Granulocytes: 0.08 10*3/uL — ABNORMAL HIGH (ref 0.00–0.07)
Basophils Absolute: 0.1 10*3/uL (ref 0.0–0.1)
Basophils Relative: 1 %
Eosinophils Absolute: 0.5 10*3/uL (ref 0.0–0.5)
Eosinophils Relative: 6 %
HCT: 42.1 % (ref 39.0–52.0)
Hemoglobin: 13.8 g/dL (ref 13.0–17.0)
Immature Granulocytes: 1 %
Lymphocytes Relative: 11 %
Lymphs Abs: 1 10*3/uL (ref 0.7–4.0)
MCH: 31.1 pg (ref 26.0–34.0)
MCHC: 32.8 g/dL (ref 30.0–36.0)
MCV: 94.8 fL (ref 80.0–100.0)
Monocytes Absolute: 0.6 10*3/uL (ref 0.1–1.0)
Monocytes Relative: 7 %
Neutro Abs: 7.1 10*3/uL (ref 1.7–7.7)
Neutrophils Relative %: 74 %
Platelet Count: 248 10*3/uL (ref 150–400)
RBC: 4.44 MIL/uL (ref 4.22–5.81)
RDW: 14 % (ref 11.5–15.5)
WBC Count: 9.4 10*3/uL (ref 4.0–10.5)
nRBC: 0 % (ref 0.0–0.2)

## 2020-08-11 LAB — CMP (CANCER CENTER ONLY)
ALT: 23 U/L (ref 0–44)
AST: 22 U/L (ref 15–41)
Albumin: 3.8 g/dL (ref 3.5–5.0)
Alkaline Phosphatase: 127 U/L — ABNORMAL HIGH (ref 38–126)
Anion gap: 8 (ref 5–15)
BUN: 10 mg/dL (ref 6–20)
CO2: 31 mmol/L (ref 22–32)
Calcium: 8.7 mg/dL — ABNORMAL LOW (ref 8.9–10.3)
Chloride: 96 mmol/L — ABNORMAL LOW (ref 98–111)
Creatinine: 1.02 mg/dL (ref 0.61–1.24)
GFR, Estimated: >60 mL/min (ref >60)
Glucose, Bld: 75 mg/dL (ref 70–99)
Potassium: 4.2 mmol/L (ref 3.5–5.1)
Sodium: 135 mmol/L (ref 135–145)
Total Bilirubin: 0.5 mg/dL (ref 0.3–1.2)
Total Protein: 7 g/dL (ref 6.5–8.1)

## 2020-08-11 LAB — TSH: TSH: 41.559 u[IU]/mL — ABNORMAL HIGH (ref 0.320–4.118)

## 2020-08-11 MED ORDER — SODIUM CHLORIDE 0.9 % IV SOLN
200.0000 mg | Freq: Once | INTRAVENOUS | Status: AC
  Administered 2020-08-11: 200 mg via INTRAVENOUS
  Filled 2020-08-11: qty 8

## 2020-08-11 MED ORDER — SODIUM CHLORIDE 0.9% FLUSH
10.0000 mL | INTRAVENOUS | Status: DC | PRN
  Administered 2020-08-11: 10 mL
  Filled 2020-08-11: qty 10

## 2020-08-11 MED ORDER — SODIUM CHLORIDE 0.9% FLUSH
10.0000 mL | Freq: Once | INTRAVENOUS | Status: AC
  Administered 2020-08-11: 10 mL
  Filled 2020-08-11: qty 10

## 2020-08-11 MED ORDER — HEPARIN SOD (PORK) LOCK FLUSH 100 UNIT/ML IV SOLN
500.0000 [IU] | Freq: Once | INTRAVENOUS | Status: AC | PRN
  Administered 2020-08-11: 500 [IU]
  Filled 2020-08-11: qty 5

## 2020-08-11 MED ORDER — SODIUM CHLORIDE 0.9 % IV SOLN
Freq: Once | INTRAVENOUS | Status: AC
  Filled 2020-08-11: qty 250

## 2020-08-11 NOTE — Patient Instructions (Signed)

## 2020-08-11 NOTE — Patient Instructions (Signed)
[facility] Discharge Instructions for Patients Receiving Chemotherapy  Today you received the following chemotherapy agents:  Keytruda.  To help prevent nausea and vomiting after your treatment, we encourage you to take your nausea medication as directed.   If you develop nausea and vomiting that is not controlled by your nausea medication, call the clinic.   BELOW ARE SYMPTOMS THAT SHOULD BE REPORTED IMMEDIATELY:  *FEVER GREATER THAN 100.5 F  *CHILLS WITH OR WITHOUT FEVER  NAUSEA AND VOMITING THAT IS NOT CONTROLLED WITH YOUR NAUSEA MEDICATION  *UNUSUAL SHORTNESS OF BREATH  *UNUSUAL BRUISING OR BLEEDING  TENDERNESS IN MOUTH AND THROAT WITH OR WITHOUT PRESENCE OF ULCERS  *URINARY PROBLEMS  *BOWEL PROBLEMS  UNUSUAL RASH Items with * indicate a potential emergency and should be followed up as soon as possible.  Feel free to call the clinic should you have any questions or concerns. The clinic phone number is [phone].  Please show the CHEMO ALERT CARD at check-in to the Emergency Department and triage nurse.    

## 2020-08-12 ENCOUNTER — Telehealth: Payer: Self-pay | Admitting: Physician Assistant

## 2020-08-12 ENCOUNTER — Other Ambulatory Visit: Payer: Self-pay | Admitting: Physician Assistant

## 2020-08-12 DIAGNOSIS — R7989 Other specified abnormal findings of blood chemistry: Secondary | ICD-10-CM

## 2020-08-12 MED ORDER — LEVOTHYROXINE SODIUM 137 MCG PO TABS: ORAL_TABLET | ORAL | 2 refills | Status: DC

## 2020-08-12 NOTE — Telephone Encounter (Signed)
I called the patient and spoke to the wife regarding his synthroid. She confirmed that he is taking 125 mcg. Discussed that we are increasing his dose and that I am sending a prescription for 137 mcg to the pharmacy.

## 2020-08-14 ENCOUNTER — Telehealth: Payer: Self-pay | Admitting: Internal Medicine

## 2020-08-14 NOTE — Telephone Encounter (Signed)
Rescheduled appt per 11/18 sch msg - pt wife is aware of appt date and time

## 2020-08-20 ENCOUNTER — Ambulatory Visit: Payer: Medicare Other

## 2020-08-20 ENCOUNTER — Other Ambulatory Visit: Payer: Medicare Other

## 2020-08-20 ENCOUNTER — Ambulatory Visit: Payer: Medicare Other | Admitting: Physician Assistant

## 2020-08-26 DIAGNOSIS — E063 Autoimmune thyroiditis: Secondary | ICD-10-CM | POA: Diagnosis not present

## 2020-08-26 DIAGNOSIS — Z720 Tobacco use: Secondary | ICD-10-CM | POA: Diagnosis not present

## 2020-08-26 DIAGNOSIS — J449 Chronic obstructive pulmonary disease, unspecified: Secondary | ICD-10-CM | POA: Diagnosis not present

## 2020-08-26 DIAGNOSIS — I10 Essential (primary) hypertension: Secondary | ICD-10-CM | POA: Diagnosis not present

## 2020-08-27 DIAGNOSIS — G894 Chronic pain syndrome: Secondary | ICD-10-CM | POA: Diagnosis not present

## 2020-09-02 ENCOUNTER — Other Ambulatory Visit: Payer: Medicare Other

## 2020-09-02 ENCOUNTER — Ambulatory Visit: Payer: Medicare Other | Admitting: Internal Medicine

## 2020-09-02 ENCOUNTER — Ambulatory Visit: Payer: Medicare Other

## 2020-09-03 ENCOUNTER — Ambulatory Visit (INDEPENDENT_AMBULATORY_CARE_PROVIDER_SITE_OTHER): Payer: Medicare Other

## 2020-09-03 DIAGNOSIS — I255 Ischemic cardiomyopathy: Secondary | ICD-10-CM | POA: Diagnosis not present

## 2020-09-03 LAB — CUP PACEART REMOTE DEVICE CHECK
Battery Remaining Longevity: 120 mo
Battery Voltage: 3 V
Brady Statistic RV Percent Paced: 0.01 %
Date Time Interrogation Session: 20211208022605
HighPow Impedance: 81 Ohm
Implantable Lead Implant Date: 20190606
Implantable Lead Location: 753860
Implantable Pulse Generator Implant Date: 20190606
Lead Channel Impedance Value: 342 Ohm
Lead Channel Impedance Value: 399 Ohm
Lead Channel Pacing Threshold Amplitude: 0.875 V
Lead Channel Pacing Threshold Pulse Width: 0.4 ms
Lead Channel Sensing Intrinsic Amplitude: 20.25 mV
Lead Channel Sensing Intrinsic Amplitude: 20.25 mV
Lead Channel Setting Pacing Amplitude: 2.5 V
Lead Channel Setting Pacing Pulse Width: 0.4 ms
Lead Channel Setting Sensing Sensitivity: 0.3 mV

## 2020-09-04 ENCOUNTER — Telehealth: Payer: Self-pay | Admitting: Medical Oncology

## 2020-09-04 NOTE — Telephone Encounter (Signed)
LVM for pt and wife re : next appt.

## 2020-09-04 NOTE — Telephone Encounter (Signed)
-----   Message from Curt Bears, MD sent at 09/02/2020  4:35 PM EST ----- Ok to wait ----- Message ----- From: Nicholaus Corolla Sent: 09/02/2020   8:53 AM EST To: Curt Bears, MD, Ardeen Garland, RN  Pt wife called in and cancelled todays - both are not feeling well.  Next appt is 12/28 . Does hee need a sooner appt or is it okay to wait til then ?

## 2020-09-10 ENCOUNTER — Other Ambulatory Visit: Payer: Medicare Other

## 2020-09-10 ENCOUNTER — Ambulatory Visit: Payer: Medicare Other | Admitting: Internal Medicine

## 2020-09-10 ENCOUNTER — Ambulatory Visit: Payer: Medicare Other

## 2020-09-15 NOTE — Progress Notes (Signed)
Remote ICD transmission.   

## 2020-09-22 ENCOUNTER — Telehealth: Payer: Self-pay | Admitting: Medical Oncology

## 2020-09-22 NOTE — Telephone Encounter (Signed)
3-4 days ago -started "coughing all night long, runny nose ( they ran out of kleenex and he is using TP) , sneezing .  According to wife Dary is not vaccinated for COVID  and he saw his family over christmas and did not wear a mask.  I instructed his sister in law to take him for COVID test and if symptoms worsen go to ED.  F/U appt tomorrow-I told wife that he needs COVID test results before we can determine if he comes in tomorrow for his appt.

## 2020-09-23 ENCOUNTER — Other Ambulatory Visit: Payer: Medicare Other

## 2020-09-23 ENCOUNTER — Ambulatory Visit: Payer: Medicare Other

## 2020-09-23 ENCOUNTER — Ambulatory Visit: Payer: Medicare Other | Admitting: Internal Medicine

## 2020-09-25 ENCOUNTER — Other Ambulatory Visit: Payer: Medicare Other

## 2020-09-25 ENCOUNTER — Other Ambulatory Visit: Payer: Self-pay

## 2020-09-25 DIAGNOSIS — Z20822 Contact with and (suspected) exposure to covid-19: Secondary | ICD-10-CM | POA: Diagnosis not present

## 2020-09-26 DIAGNOSIS — I10 Essential (primary) hypertension: Secondary | ICD-10-CM | POA: Diagnosis not present

## 2020-09-26 DIAGNOSIS — E063 Autoimmune thyroiditis: Secondary | ICD-10-CM | POA: Diagnosis not present

## 2020-09-26 DIAGNOSIS — J449 Chronic obstructive pulmonary disease, unspecified: Secondary | ICD-10-CM | POA: Diagnosis not present

## 2020-09-26 DIAGNOSIS — Z720 Tobacco use: Secondary | ICD-10-CM | POA: Diagnosis not present

## 2020-09-26 LAB — SARS-COV-2, NAA 2 DAY TAT

## 2020-09-26 LAB — NOVEL CORONAVIRUS, NAA: SARS-CoV-2, NAA: NOT DETECTED

## 2020-09-29 DIAGNOSIS — G894 Chronic pain syndrome: Secondary | ICD-10-CM | POA: Diagnosis not present

## 2020-09-29 DIAGNOSIS — Z89511 Acquired absence of right leg below knee: Secondary | ICD-10-CM | POA: Diagnosis not present

## 2020-09-29 DIAGNOSIS — G546 Phantom limb syndrome with pain: Secondary | ICD-10-CM | POA: Diagnosis not present

## 2020-10-16 ENCOUNTER — Ambulatory Visit: Payer: Medicare Other

## 2020-10-16 ENCOUNTER — Other Ambulatory Visit: Payer: Medicare Other

## 2020-10-16 ENCOUNTER — Ambulatory Visit: Payer: Medicare Other | Admitting: Internal Medicine

## 2020-10-28 ENCOUNTER — Telehealth: Payer: Self-pay | Admitting: Medical Oncology

## 2020-10-28 ENCOUNTER — Ambulatory Visit: Payer: Medicare Other | Admitting: Internal Medicine

## 2020-10-28 ENCOUNTER — Other Ambulatory Visit: Payer: Medicare Other

## 2020-10-28 ENCOUNTER — Ambulatory Visit: Payer: Medicare Other

## 2020-10-28 NOTE — Telephone Encounter (Signed)
Wife called Access nurse and left message to cancel pt appts today . I lvm and told wife to return my call.  appts cancelled.

## 2020-10-29 DIAGNOSIS — G894 Chronic pain syndrome: Secondary | ICD-10-CM | POA: Diagnosis not present

## 2020-11-03 ENCOUNTER — Telehealth: Payer: Self-pay | Admitting: Medical Oncology

## 2020-11-03 NOTE — Telephone Encounter (Signed)
Pt in [place] -unable to leave due to being "snowed in". Wife said she will call back to r/s appts.

## 2020-11-04 ENCOUNTER — Telehealth: Payer: Self-pay

## 2020-11-04 NOTE — Telephone Encounter (Signed)
Returned call to pt about rescheduling appointments from 10/28/20. Will send scheduling a message to call pt and reschedule when pt returns from out of town.

## 2020-11-05 ENCOUNTER — Telehealth: Payer: Self-pay | Admitting: Internal Medicine

## 2020-11-05 NOTE — Telephone Encounter (Signed)
Scheduled appt per 2/8 sch msg - pt is aware of appt date and time on 2/14
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
## 2020-11-10 ENCOUNTER — Ambulatory Visit: Payer: Medicare Other | Admitting: Physician Assistant

## 2020-11-10 ENCOUNTER — Inpatient Hospital Stay: Payer: Medicare Other | Attending: Physician Assistant

## 2020-11-10 ENCOUNTER — Inpatient Hospital Stay (HOSPITAL_BASED_OUTPATIENT_CLINIC_OR_DEPARTMENT_OTHER): Payer: Medicare Other | Admitting: Physician Assistant

## 2020-11-10 ENCOUNTER — Inpatient Hospital Stay: Payer: Medicare Other

## 2020-11-10 ENCOUNTER — Encounter: Payer: Self-pay | Admitting: Physician Assistant

## 2020-11-10 ENCOUNTER — Other Ambulatory Visit: Payer: Self-pay

## 2020-11-10 ENCOUNTER — Ambulatory Visit: Payer: Medicare Other

## 2020-11-10 ENCOUNTER — Other Ambulatory Visit: Payer: Medicare Other

## 2020-11-10 VITALS — BP 104/83 | HR 84 | Temp 97.6°F | Resp 18 | Ht 72.0 in

## 2020-11-10 DIAGNOSIS — Z7982 Long term (current) use of aspirin: Secondary | ICD-10-CM | POA: Diagnosis not present

## 2020-11-10 DIAGNOSIS — Z5112 Encounter for antineoplastic immunotherapy: Secondary | ICD-10-CM | POA: Insufficient documentation

## 2020-11-10 DIAGNOSIS — Z95810 Presence of automatic (implantable) cardiac defibrillator: Secondary | ICD-10-CM | POA: Diagnosis not present

## 2020-11-10 DIAGNOSIS — Z86718 Personal history of other venous thrombosis and embolism: Secondary | ICD-10-CM | POA: Diagnosis not present

## 2020-11-10 DIAGNOSIS — E7800 Pure hypercholesterolemia, unspecified: Secondary | ICD-10-CM | POA: Diagnosis not present

## 2020-11-10 DIAGNOSIS — B182 Chronic viral hepatitis C: Secondary | ICD-10-CM | POA: Insufficient documentation

## 2020-11-10 DIAGNOSIS — I252 Old myocardial infarction: Secondary | ICD-10-CM | POA: Insufficient documentation

## 2020-11-10 DIAGNOSIS — F419 Anxiety disorder, unspecified: Secondary | ICD-10-CM | POA: Diagnosis not present

## 2020-11-10 DIAGNOSIS — Z79899 Other long term (current) drug therapy: Secondary | ICD-10-CM | POA: Insufficient documentation

## 2020-11-10 DIAGNOSIS — C3411 Malignant neoplasm of upper lobe, right bronchus or lung: Secondary | ICD-10-CM | POA: Diagnosis not present

## 2020-11-10 DIAGNOSIS — R519 Headache, unspecified: Secondary | ICD-10-CM | POA: Insufficient documentation

## 2020-11-10 DIAGNOSIS — C3491 Malignant neoplasm of unspecified part of right bronchus or lung: Secondary | ICD-10-CM

## 2020-11-10 DIAGNOSIS — R569 Unspecified convulsions: Secondary | ICD-10-CM | POA: Insufficient documentation

## 2020-11-10 DIAGNOSIS — Z923 Personal history of irradiation: Secondary | ICD-10-CM | POA: Diagnosis not present

## 2020-11-10 DIAGNOSIS — F32A Depression, unspecified: Secondary | ICD-10-CM | POA: Insufficient documentation

## 2020-11-10 DIAGNOSIS — F17210 Nicotine dependence, cigarettes, uncomplicated: Secondary | ICD-10-CM | POA: Diagnosis not present

## 2020-11-10 LAB — CMP (CANCER CENTER ONLY)
ALT: 24 U/L (ref 0–44)
AST: 24 U/L (ref 15–41)
Albumin: 3.3 g/dL — ABNORMAL LOW (ref 3.5–5.0)
Alkaline Phosphatase: 125 U/L (ref 38–126)
Anion gap: 7 (ref 5–15)
BUN: 10 mg/dL (ref 6–20)
CO2: 29 mmol/L (ref 22–32)
Calcium: 8.9 mg/dL (ref 8.9–10.3)
Chloride: 97 mmol/L — ABNORMAL LOW (ref 98–111)
Creatinine: 0.95 mg/dL (ref 0.61–1.24)
GFR, Estimated: >60 mL/min (ref >60)
Glucose, Bld: 92 mg/dL (ref 70–99)
Potassium: 4.3 mmol/L (ref 3.5–5.1)
Sodium: 133 mmol/L — ABNORMAL LOW (ref 135–145)
Total Bilirubin: 0.3 mg/dL (ref 0.3–1.2)
Total Protein: 7.2 g/dL (ref 6.5–8.1)

## 2020-11-10 LAB — TSH: TSH: 25.042 u[IU]/mL — ABNORMAL HIGH (ref 0.320–4.118)

## 2020-11-10 LAB — CBC WITH DIFFERENTIAL (CANCER CENTER ONLY)
Abs Immature Granulocytes: 0.17 10*3/uL — ABNORMAL HIGH (ref 0.00–0.07)
Basophils Absolute: 0.1 10*3/uL (ref 0.0–0.1)
Basophils Relative: 1 %
Eosinophils Absolute: 0.4 10*3/uL (ref 0.0–0.5)
Eosinophils Relative: 4 %
HCT: 37.2 % — ABNORMAL LOW (ref 39.0–52.0)
Hemoglobin: 12.1 g/dL — ABNORMAL LOW (ref 13.0–17.0)
Immature Granulocytes: 2 %
Lymphocytes Relative: 9 %
Lymphs Abs: 1 10*3/uL (ref 0.7–4.0)
MCH: 29.1 pg (ref 26.0–34.0)
MCHC: 32.5 g/dL (ref 30.0–36.0)
MCV: 89.4 fL (ref 80.0–100.0)
Monocytes Absolute: 1 10*3/uL (ref 0.1–1.0)
Monocytes Relative: 9 %
Neutro Abs: 7.8 10*3/uL — ABNORMAL HIGH (ref 1.7–7.7)
Neutrophils Relative %: 75 %
Platelet Count: 329 10*3/uL (ref 150–400)
RBC: 4.16 MIL/uL — ABNORMAL LOW (ref 4.22–5.81)
RDW: 14.7 % (ref 11.5–15.5)
WBC Count: 10.4 10*3/uL (ref 4.0–10.5)
nRBC: 0 % (ref 0.0–0.2)

## 2020-11-10 MED ORDER — SODIUM CHLORIDE 0.9 % IV SOLN
Freq: Once | INTRAVENOUS | Status: AC
  Filled 2020-11-10: qty 250

## 2020-11-10 MED ORDER — SODIUM CHLORIDE 0.9 % IV SOLN
200.0000 mg | Freq: Once | INTRAVENOUS | Status: AC
  Administered 2020-11-10: 200 mg via INTRAVENOUS
  Filled 2020-11-10: qty 8

## 2020-11-10 MED ORDER — HEPARIN SOD (PORK) LOCK FLUSH 100 UNIT/ML IV SOLN
500.0000 [IU] | Freq: Once | INTRAVENOUS | Status: AC | PRN
  Administered 2020-11-10: 500 [IU]
  Filled 2020-11-10: qty 5

## 2020-11-10 MED ORDER — SODIUM CHLORIDE 0.9% FLUSH
10.0000 mL | INTRAVENOUS | Status: DC | PRN
  Administered 2020-11-10: 10 mL
  Filled 2020-11-10: qty 10

## 2020-11-10 NOTE — Patient Instructions (Signed)
[facility] Discharge Instructions for Patients Receiving Chemotherapy  Today you received the following chemotherapy agents: pembrolizumab.  To help prevent nausea and vomiting after your treatment, we encourage you to take your nausea medication as directed.   If you develop nausea and vomiting that is not controlled by your nausea medication, call the clinic.   BELOW ARE SYMPTOMS THAT SHOULD BE REPORTED IMMEDIATELY:  *FEVER GREATER THAN 100.5 F  *CHILLS WITH OR WITHOUT FEVER  NAUSEA AND VOMITING THAT IS NOT CONTROLLED WITH YOUR NAUSEA MEDICATION  *UNUSUAL SHORTNESS OF BREATH  *UNUSUAL BRUISING OR BLEEDING  TENDERNESS IN MOUTH AND THROAT WITH OR WITHOUT PRESENCE OF ULCERS  *URINARY PROBLEMS  *BOWEL PROBLEMS  UNUSUAL RASH Items with * indicate a potential emergency and should be followed up as soon as possible.  Feel free to call the clinic should you have any questions or concerns. The clinic phone number is [phone].  Please show the CHEMO ALERT CARD at check-in to the Emergency Department and triage nurse.   

## 2020-11-11 ENCOUNTER — Telehealth: Payer: Self-pay | Admitting: Physician Assistant

## 2020-11-11 NOTE — Telephone Encounter (Signed)
Scheduled appointments per 2/14 los. Spoke to patient's wife who is aware of appointments dates and times.

## 2020-11-21 DIAGNOSIS — G894 Chronic pain syndrome: Secondary | ICD-10-CM | POA: Diagnosis not present

## 2020-11-24 DIAGNOSIS — E7849 Other hyperlipidemia: Secondary | ICD-10-CM | POA: Diagnosis not present

## 2020-11-24 DIAGNOSIS — C3491 Malignant neoplasm of unspecified part of right bronchus or lung: Secondary | ICD-10-CM | POA: Diagnosis not present

## 2020-11-24 DIAGNOSIS — I10 Essential (primary) hypertension: Secondary | ICD-10-CM | POA: Diagnosis not present

## 2020-11-24 DIAGNOSIS — J449 Chronic obstructive pulmonary disease, unspecified: Secondary | ICD-10-CM | POA: Diagnosis not present

## 2020-11-27 ENCOUNTER — Ambulatory Visit (HOSPITAL_COMMUNITY): Payer: Medicare Other

## 2020-12-01 ENCOUNTER — Other Ambulatory Visit: Payer: Medicare Other

## 2020-12-01 ENCOUNTER — Ambulatory Visit: Payer: Medicare Other | Admitting: Internal Medicine

## 2020-12-01 ENCOUNTER — Ambulatory Visit: Payer: Medicare Other

## 2020-12-02 ENCOUNTER — Ambulatory Visit (HOSPITAL_COMMUNITY): Admission: RE | Admit: 2020-12-02 | Payer: Medicare Other | Source: Ambulatory Visit

## 2020-12-03 ENCOUNTER — Ambulatory Visit (INDEPENDENT_AMBULATORY_CARE_PROVIDER_SITE_OTHER): Payer: Medicare Other

## 2020-12-03 DIAGNOSIS — I255 Ischemic cardiomyopathy: Secondary | ICD-10-CM

## 2020-12-05 LAB — CUP PACEART REMOTE DEVICE CHECK
Battery Remaining Longevity: 118 mo
Battery Voltage: 3 V
Brady Statistic RV Percent Paced: 0.01 %
Date Time Interrogation Session: 20220309043723
HighPow Impedance: 76 Ohm
Implantable Lead Implant Date: 20190606
Implantable Lead Location: 753860
Implantable Pulse Generator Implant Date: 20190606
Lead Channel Impedance Value: 342 Ohm
Lead Channel Impedance Value: 399 Ohm
Lead Channel Pacing Threshold Amplitude: 0.75 V
Lead Channel Pacing Threshold Pulse Width: 0.4 ms
Lead Channel Sensing Intrinsic Amplitude: 17.5 mV
Lead Channel Sensing Intrinsic Amplitude: 17.5 mV
Lead Channel Setting Pacing Amplitude: 2.5 V
Lead Channel Setting Pacing Pulse Width: 0.4 ms
Lead Channel Setting Sensing Sensitivity: 0.3 mV

## 2020-12-08 ENCOUNTER — Telehealth: Payer: Self-pay

## 2020-12-08 NOTE — Telephone Encounter (Signed)
Pts wife, [name], called wanting to r/s his missed MRI and CT scan. I have provided her with the number to Radiology scheduling. I also advised when pts next appt with [place] is. She expressed understanding and states she will give them a call to reschedule.

## 2020-12-12 ENCOUNTER — Encounter (HOSPITAL_COMMUNITY): Payer: Self-pay

## 2020-12-12 ENCOUNTER — Other Ambulatory Visit: Payer: Self-pay

## 2020-12-12 ENCOUNTER — Ambulatory Visit (HOSPITAL_COMMUNITY)
Admission: RE | Admit: 2020-12-12 | Discharge: 2020-12-12 | Disposition: A | Discharge status: Home / Self Care | Payer: Medicare Other | Source: Ambulatory Visit | Attending: Physician Assistant | Admitting: Physician Assistant

## 2020-12-12 DIAGNOSIS — J929 Pleural plaque without asbestos: Secondary | ICD-10-CM | POA: Diagnosis not present

## 2020-12-12 DIAGNOSIS — I2510 Atherosclerotic heart disease of native coronary artery without angina pectoris: Secondary | ICD-10-CM | POA: Diagnosis not present

## 2020-12-12 DIAGNOSIS — C3491 Malignant neoplasm of unspecified part of right bronchus or lung: Secondary | ICD-10-CM | POA: Diagnosis not present

## 2020-12-12 DIAGNOSIS — C3490 Malignant neoplasm of unspecified part of unspecified bronchus or lung: Secondary | ICD-10-CM | POA: Diagnosis not present

## 2020-12-12 MED ORDER — IOHEXOL 300 MG/ML  SOLN
75.0000 mL | Freq: Once | INTRAMUSCULAR | Status: AC | PRN
  Administered 2020-12-12: 75 mL via INTRAVENOUS

## 2020-12-12 MED ORDER — HEPARIN SOD (PORK) LOCK FLUSH 100 UNIT/ML IV SOLN
INTRAVENOUS | Status: AC
  Filled 2020-12-12: qty 5

## 2020-12-12 NOTE — Progress Notes (Signed)
Remote ICD transmission.   

## 2020-12-16 DIAGNOSIS — Z1389 Encounter for screening for other disorder: Secondary | ICD-10-CM | POA: Diagnosis not present

## 2020-12-16 DIAGNOSIS — Z0001 Encounter for general adult medical examination with abnormal findings: Secondary | ICD-10-CM | POA: Diagnosis not present

## 2020-12-16 DIAGNOSIS — I10 Essential (primary) hypertension: Secondary | ICD-10-CM | POA: Diagnosis not present

## 2020-12-16 DIAGNOSIS — G894 Chronic pain syndrome: Secondary | ICD-10-CM | POA: Diagnosis not present

## 2020-12-16 DIAGNOSIS — E7849 Other hyperlipidemia: Secondary | ICD-10-CM | POA: Diagnosis not present

## 2020-12-16 DIAGNOSIS — Z0000 Encounter for general adult medical examination without abnormal findings: Secondary | ICD-10-CM | POA: Diagnosis not present

## 2020-12-17 ENCOUNTER — Ambulatory Visit (HOSPITAL_COMMUNITY)
Admission: RE | Admit: 2020-12-17 | Discharge: 2020-12-17 | Disposition: A | Discharge status: Home / Self Care | Payer: Medicare Other | Source: Ambulatory Visit | Attending: Physician Assistant | Admitting: Physician Assistant

## 2020-12-17 ENCOUNTER — Other Ambulatory Visit: Payer: Self-pay

## 2020-12-17 DIAGNOSIS — R569 Unspecified convulsions: Secondary | ICD-10-CM | POA: Diagnosis not present

## 2020-12-17 DIAGNOSIS — C3491 Malignant neoplasm of unspecified part of right bronchus or lung: Secondary | ICD-10-CM

## 2020-12-17 MED ORDER — GADOBUTROL 1 MMOL/ML IV SOLN
9.0000 mL | Freq: Once | INTRAVENOUS | Status: AC | PRN
  Administered 2020-12-17: 9 mL via INTRAVENOUS

## 2020-12-17 NOTE — Progress Notes (Signed)
[facility] OFFICE PROGRESS NOTE  Mark Nevins, MD [address] [place] [address]  DIAGNOSIS: Recurrent lung cancer initially diagnosed as stage IIIA (T3, N2, M0) non-small cell lung cancer, adenosquamous carcinoma diagnosed in June 2019 and presented with large right upper lobe lung mass with questionable chest wall invasion as well as right hilar and mediastinal lymphadenopathy.  Biomarker Findings Tumor Mutational Burden - TMB-High (21 Muts/Mb) Microsatellite status - MS-Stable Genomic Findings For a complete list of the genes assayed, please refer to the Appendix. ATM U0278* CCND2 P281R KRAS G12V CHEK2 T357fs*15 TP53 E298* 7 Disease relevant genes with no reportable alterations: ALK, EGFR, BRAF, MET, RET, ERBB2, ROS1  PDL1 Expression: 99%  PRIOR THERAPY: 1) Concurrent chemoradiation with chemotherapy consisting of weekly carboplatin for an AUC of 2 and paclitaxel 45 mg/m2. First dose given on 04/03/2018.Status post 6 cycles. Last dose was giving 05/15/2018 with partial response. 2) Consolidation treatment with immunotherapy with Imfinzi (Durvalumab) 10 mg/KG every 2 weeks. First dose 06/20/2018. Status post 26 cycles.  CURRENT THERAPY: Systemic treatment with immunotherapy with Keytruda 200 mg IV every 3 weeks. First dose February 27, 2020.Status post9cycles.   INTERVAL HISTORY: [name] 60 y.o. male returns to the clinic for a follow up visit accompanied by his wife. The patient is feeling well today without any concerning complaints. The patient had been lost to follow up since 08/11/20. He/his wife kept calling the clinic to cancel and reschedule appointments. He states his father is on hospice in [place] due to renal failure. His mother was also in a nursing home. The history is limited due to the patient being a poor historian. He then came to the clinic on 11/10/20 and received one treatment before missing his last appointment in early March  2022.   The patienthas been tolerating his treatment with immunotherapy well with out any adverse side effects.Denies any fever, chills, or weight loss. Denies night sweats. Denies any chest pain or hemoptysis.He reports his baseline shortness of breath and cough. He has wheezing at night. He has not been using his inhaler due to the expense.The patient continues to smoke cigarettes which contributes to his respiratory complaints. Denies any nausea, vomiting,ordiarrhea. He typically has constipation for which he uses stool softener. Denies any headache or visual changes. He recently had a restaging CT scan of the chest as well as a brain MRI due to reporting seizure like activity at his last appointment. The patient is here today for evaluationand to review his scan results prior to starting cycle #10     MEDICAL HISTORY: Past Medical History:  Diagnosis Date  . Acute hepatitis C virus infection 06/19/2007   Qualifier: Diagnosis of  By: Massie Maroon CMA, Tiffany    . Acute ST elevation myocardial infarction (STEMI) involving left anterior descending (LAD) coronary artery (HCC) 05/26/2016  . Acute ST elevation myocardial infarction (STEMI) of lateral wall (HCC) 05/26/2016  . 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 Anxiety   . Asthma   . Atherosclerosis of native arteries of the extremities with intermittent claudication 12/16/2011  . Chronic hepatitis C without hepatic coma (HCC) 05/03/2014  . 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/2018   FEV1 57%  . Depression   . DVT (deep venous thrombosis) (HCC) 2009; 2019   RLE; LLE  . Encounter for antineoplastic chemotherapy 03/22/2018  . Hepatitis C    "tx'd in 2015"  . High cholesterol   . Ischemic cardiomyopathy 03/02/2018  . Non-small cell carcinoma  of right lung, stage 3 ([place]) 03/22/2018  . PAD (peripheral artery disease) ([place]) 01/25/2013  . Peripheral vascular disease, unspecified 07/20/2012  . Port-A-Cath  in place 04/24/2018  . ST elevation myocardial infarction involving left anterior descending (LAD) coronary artery ([place])   . STEMI (ST elevation myocardial infarction) ([place]) 05/26/2016  . Tobacco abuse 01/28/2016  . Tubular adenoma of colon 07/11/2014  . Urinary dribbling     ALLERGIES:  is allergic to lyrica [pregabalin] and neurontin [gabapentin].  MEDICATIONS:  Current Outpatient Medications  Medication Sig Dispense Refill  . albuterol (VENTOLIN HFA) 108 (90 Base) MCG/ACT inhaler SMARTSIG:1-2 Puff(s) Via Inhaler Every 4 Hours PRN    . alprazolam (XANAX) 2 MG tablet Take 2 mg by mouth 4 (four) times daily as needed for anxiety.    [name] amitriptyline (ELAVIL) 100 MG tablet Take 100 mg by mouth at bedtime.     [name] aspirin 81 MG EC tablet Take 81 mg by mouth daily.    [name] atorvastatin (LIPITOR) 80 MG tablet Take 1 tablet (80 mg total) by mouth daily at 6 PM. 30 tablet 12  . carvedilol (COREG) 6.25 MG tablet TAKE 1 TABLET (6.25 MG TOTAL) BY MOUTH 2 (TWO) TIMES DAILY WITH A MEAL. OFFICE VISIT NEEDED 180 tablet 2  . citalopram (CELEXA) 40 MG tablet Take 40 mg by mouth daily.  11  . furosemide (LASIX) 20 MG tablet Take 1 tablet (20 mg total) by mouth daily. Please make overdue appt with Dr. Oval Linsey before anymore refills. 2nd attempt 15 tablet 0  . HYDROcodone-acetaminophen (NORCO) 10-325 MG tablet Take 1 tablet by mouth every 4 (four) hours as needed for moderate pain.   0  . levothyroxine (SYNTHROID) 125 MCG tablet Take 125 mcg by mouth daily.    [name] lidocaine-prilocaine (EMLA) cream Apply 1 application topically as needed. 30 g 0  . lisinopril (PRINIVIL,ZESTRIL) 2.5 MG tablet Take 1 tablet (2.5 mg total) by mouth daily. 30 tablet 12  . nitroGLYCERIN (NITROSTAT) 0.4 MG SL tablet Place 1 tablet (0.4 mg total) under the tongue every 5 (five) minutes x 3 doses as needed for chest pain. 25 tablet 2  . prochlorperazine (COMPAZINE) 10 MG tablet Take 1 tablet (10 mg total) by mouth every 6 (six) hours as needed  for nausea or vomiting. 30 tablet 0  . spironolactone (ALDACTONE) 25 MG tablet TAKE 1 TABLET BY MOUTH EVERY DAY 90 tablet 0  . sucralfate (CARAFATE) 1 g tablet Take 1 tablet (1 g total) by mouth 4 (four) times daily -  with meals and at bedtime. 5 min before meals for radiation induced esophagitis 120 tablet 2  . tamsulosin (FLOMAX) 0.4 MG CAPS capsule Take 1 capsule by mouth daily.    . TRELEGY ELLIPTA 100-62.5-25 MCG/INH AEPB Inhale 1 puff into the lungs daily.     No current facility-administered medications for this visit.   Facility-Administered Medications Ordered in Other Visits  Medication Dose Route Frequency Provider Last Rate Last Admin  . sodium chloride flush (NS) 0.9 % injection 10 mL  10 mL Intracatheter PRN Curt Bears, MD   10 mL at 03/19/20 1544    SURGICAL HISTORY:  Past Surgical History:  Procedure Laterality Date  . AORTOGRAM  08/08/2009   for LLE claudication     By Dr. Oneida Alar  . 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 BIOPSY N/A 05/30/2014   Procedure: BIOPSY;  Surgeon: [name], MD;  Location: [facility];  Service: Endoscopy;  Laterality: N/A;  .  BLADDER TUMOR EXCISION  8/812  . CARDIAC CATHETERIZATION N/A 05/26/2016   Procedure: Left Heart Cath and Coronary Angiography;  Surgeon: Lennette Bihari, MD;  Location: [facility] INVASIVE CV LAB;  Service: Cardiovascular;  Laterality: N/A;  . CARDIAC CATHETERIZATION N/A 05/26/2016   Procedure: Coronary Stent Intervention;  Surgeon: Lennette Bihari, MD;  Location: [facility] INVASIVE CV LAB;  Service: Cardiovascular;  Laterality: N/A;  . COLONOSCOPY WITH PROPOFOL N/A 05/30/2014   GBB:HQSUIW colonic polyp-likely source of hematochezia-removed as described above  . ESOPHAGOGASTRODUODENOSCOPY (EGD) WITH PROPOFOL N/A 05/30/2014   UAO:UMNARU and bulbar erosions s/p gastric biopsy. No evidence of portal gastropathy on today's examination.  . FEMORAL-TIBIAL BYPASS GRAFT  2009   Right side using non-reversed GSV   By Dr. Darrick Penna  .  FEMORAL-TIBIAL BYPASS GRAFT  11/24/2007   Right femoral to anterior tibial BPG   by Dr. Darrick Penna  . FINGER SURGERY Left    "straightened my pinky"  . HAND TENDON SURGERY Left 2013   Left 5th finger  . HERNIA REPAIR     "stomach"  . ICD IMPLANT N/A 03/02/2018   Procedure: ICD IMPLANT;  Surgeon: Regan Lemming, MD;  Location: [facility] INVASIVE CV LAB;  Service: Cardiovascular;  Laterality: N/A;  . INCISIONAL HERNIA REPAIR N/A 12/25/2014   Procedure: HERNIA REPAIR INCISIONAL WITH MESH;  Surgeon: Franky Macho Md, MD;  Location: [facility];  Service: General;  Laterality: N/A;  . INSERTION OF MESH N/A 12/25/2014   Procedure: INSERTION OF MESH;  Surgeon: Franky Macho Md, MD;  Location: [facility];  Service: General;  Laterality: N/A;  . IR IMAGING GUIDED PORT INSERTION  04/11/2018  . LAPAROSCOPIC CHOLECYSTECTOMY    . LOWER EXTREMITY ANGIOGRAM Left 12/30/2015   Procedure: Lower Extremity Angiogram;  Surgeon: Sherren Kerns, MD;  Location: [facility] INVASIVE CV LAB;  Service: Cardiovascular;  Laterality: Left;  . PERIPHERAL VASCULAR CATHETERIZATION N/A 12/30/2015   Procedure: Abdominal Aortogram;  Surgeon: Sherren Kerns, MD;  Location: [facility] INVASIVE CV LAB;  Service: Cardiovascular;  Laterality: N/A;  . POLYPECTOMY  05/30/2014   Procedure: POLYPECTOMY;  Surgeon: Corbin Ade, MD;  Location: [facility];  Service: Endoscopy;;  . TONSILLECTOMY AND ADENOIDECTOMY  ~ 1970  . TYMPANOSTOMY TUBE PLACEMENT Bilateral ~ 1970    REVIEW OF SYSTEMS:   Review of Systems  Constitutional: Negative for appetite change, chills, fatigue, fever and unexpected weight change.  HENT: Negative for mouth sores, nosebleeds, sore throat and trouble swallowing.  Eyes: Negative for eye problems and icterus.  Respiratory:Positive for baseline cough and shortness of breath.Negative for hemoptysis and wheezing.  Cardiovascular: Negative for chest pain and leg swelling.  Gastrointestinal:  Negative for abdominal pain, vomiting, constipation,  diarrhea, nausea. Genitourinary: Negative for bladder incontinence, difficulty urinating, dysuria, frequency and hematuria.  Musculoskeletal: Negative for back pain, gait problem, neck pain and neck stiffness.  Skin:Rashes or skin changes.  Neurological: Positive for seizure like activity and occasional numbness in the hand. Negative for dizziness, extremity weakness, gait problem, headaches, light-headedness and seizures.  Hematological: Negative for adenopathy. Does not bruise/bleed easily.  Psychiatric/Behavioral: Negative for confusion, depression and sleep disturbance. The patient is not nervous/anxious.   PHYSICAL EXAMINATION:  Blood pressure 100/79, pulse 73, temperature 97.6 F (36.4 C), temperature source Tympanic, resp. rate 19, height 6' (1.829 m), weight 215 lb 11.2 oz (97.8 kg), SpO2 98 %.  ECOG PERFORMANCE STATUS: 1-2  Physical Exam  Constitutional: Oriented to person, place, and time and well-developed, well-nourished, and in no distress.  HENT:  Head: Normocephalic and atraumatic.  Mouth/Throat: Oropharynx is clear and moist. No oropharyngeal exudate.  Eyes: Conjunctivae are normal. Right eye exhibits no discharge. Left eye exhibits no discharge. No scleral icterus.  Neck: Normal range of motion. Neck supple.  Cardiovascular: Normal rate, regular rhythm, normal heart sounds and intact distal pulses.  Pulmonary/Chest:Effort normal. Some expiratory wheezing on exam bilaterally. No respiratory distress. No rales.  Abdominal: Soft. Bowel sounds are normal. Exhibits no distension and no mass. There is no tenderness.  Musculoskeletal: BKA noted on the right leg.Normal range of motion. Exhibits no edema.  Lymphadenopathy:  No cervical adenopathy.  Neurological: Alert and oriented to person, place, and time. Exhibits normal muscle tone. Examined in the wheelchair. BKA on right leg.  Skin:Skin is warm and dry. No rash noted. Not diaphoretic. No erythema. No pallor.   Psychiatric: Mood, memory and judgment normal.  Vitals reviewed.  LABORATORY DATA: Lab Results  Component Value Date   WBC 10.3 12/22/2020   HGB 11.6 (L) 12/22/2020   HCT 37.0 (L) 12/22/2020   MCV 88.5 12/22/2020   PLT 322 12/22/2020      Chemistry      Component Value Date/Time   NA 135 12/22/2020 0945   NA 139 02/27/2018 1457   K 4.2 12/22/2020 0945   CL 96 (L) 12/22/2020 0945   CO2 29 12/22/2020 0945   BUN 9 12/22/2020 0945   BUN 7 02/27/2018 1457   CREATININE 0.89 12/22/2020 0945   CREATININE 1.13 06/28/2016 1453      Component Value Date/Time   CALCIUM 8.8 (L) 12/22/2020 0945   ALKPHOS 128 (H) 12/22/2020 0945   AST 42 (H) 12/22/2020 0945   ALT 59 (H) 12/22/2020 0945   BILITOT 0.3 12/22/2020 0945       RADIOGRAPHIC STUDIES:  CT Chest W Contrast  Result Date: 12/14/2020 CLINICAL DATA:  Metastatic lung cancer, assess treatment response EXAM: CT CHEST WITH CONTRAST TECHNIQUE: Multidetector CT imaging of the chest was performed during intravenous contrast administration. CONTRAST:  6mL OMNIPAQUE IOHEXOL 300 MG/ML  SOLN COMPARISON:  CT chest, 07/07/2020, 05/06/2020, PET-CT, 02/08/2020 FINDINGS: Cardiovascular: Right chest port catheter. Chest multi lead pacer. Normal heart size. Left coronary artery calcifications and stents. No pericardial effusion. Mediastinum/Nodes: Unchanged post treatment appearance of soft tissue about the right hilum. No discretely enlarged mediastinal, hilar, or axillary lymph nodes. Thyroid gland, trachea, and esophagus demonstrate no significant findings. Lungs/Pleura: Diffuse bilateral bronchial wall thickening. Redemonstrated dense post treatment consolidation of the right apex. There has been interval increase in consolidation anteriorly with new cavitation (series 8, image 40, 32). Stable small ground-glass nodule of the peripheral left upper lobe measuring 7 mm (series 8, image 43). Benign fissural nodule of the superior segment left lower  lobe measuring 9 mm (series 8, image 71). Definitively benign, calcified subpleural nodule of the dependent left lower lobe (series 8, image 94). No pleural effusion or pneumothorax. Upper Abdomen: No acute abnormality. Musculoskeletal: No chest wall mass or suspicious bone lesions identified. IMPRESSION: 1. Redemonstrated dense post treatment consolidation of the right apex. There has been interval increase in consolidation anteriorly with new cavitation. Findings are most consistent with worsened malignancy, particularly given prior evidence of FDG avid disease. 2. Unchanged post treatment appearance of soft tissue about the right hilum. No discretely enlarged mediastinal or hilar lymph nodes. 3. Stable small ground-glass nodule of the peripheral left upper lobe measuring 7 mm, nonspecific. Attention on follow-up. 4. Diffuse bilateral bronchial wall thickening, consistent with nonspecific infectious or inflammatory  bronchitis. 5. Coronary artery disease. Electronically Signed   By: Eddie Candle M.D.   On: 12/14/2020 15:38   MR Brain W Wo Contrast  Result Date: 12/18/2020 CLINICAL DATA:  Lung cancer with increased seizure-like activity EXAM: MRI HEAD WITHOUT AND WITH CONTRAST TECHNIQUE: Multiplanar, multiecho pulse sequences of the brain and surrounding structures were obtained without and with intravenous contrast. CONTRAST:  56mL GADAVIST GADOBUTROL 1 MMOL/ML IV SOLN COMPARISON:  2019 FINDINGS: Brain: There is no acute infarction or intracranial hemorrhage. There is no intracranial mass, mass effect, or edema. There is no hydrocephalus or extra-axial fluid collection. Ventricles and sulci are within normal limits in size and configuration. Minimal patchy foci of T2 hyperintensity in the supratentorial white matter are nonspecific but may reflect minor chronic microvascular ischemic changes. No abnormal enhancement. Vascular: Major vessel flow voids at the skull base are preserved. Skull and upper cervical  spine: Normal marrow signal is preserved. Sinuses/Orbits: Maxillary sinus retention cysts. Orbits are unremarkable. Other: Partially empty sella.  Patchy mastoid fluid opacification. IMPRESSION: No intracranial mass or abnormal enhancement. No significant change since 2019 examination. Electronically Signed   By: Macy Mis M.D.   On: 12/18/2020 09:03   CUP PACEART REMOTE DEVICE CHECK  Result Date: 12/05/2020 Scheduled remote reviewed. Normal device function.  Next remote 91 days. HB    ASSESSMENT/PLAN:  This is a very pleasant 60 year old Caucasian male with stage IIIa non-small cell lung cancer, adenosquamous carcinoma. He presented with a large right upper lobe lung mass with questionable chest wall invasion as well as a right hilar and mediastinal lymphadenopathy. He was diagnosed in June 2019.His PDL1 expression is 99%  He completed 6 cycles of concurrent chemoradiation with carboplatin and paclitaxel. He had a partial response to treatment.  The patient underwent consolidation immunotherapy with Imfinzi 10 mg/kg IV every 2 weeks. He is status post26cycles. He completed this on10/03/2019  He was on observation until he showed evidence of local recurrence.   He is currently undergoing systemic chemotherapy with Keytruda 200 mg IV every 3 weeks. He is status post 9cycles.He tolerated this well without any concerning complains. The patient has missed several appointments since November 2021. He only returned to the clinic once for treatment in that interval.   The patient recently had a restaging CT scan performed. He also had an repeat MRI as he was endorsing seizure-like activity. Dr. Julien Nordmann personally and independently reviewed his scan which showed disease progression in the lung with interval increase in consolidation anteriorly with new cavitation. Dr. Julien Nordmann believes this is likely due to missing treatment. His MRI did not show metastatic disease to the brain.   We  will resume his treatment with immunotherapy with [place]. We will recheck a restaging CT scan after 3 more treatments. If he continues to have disease progression, Dr. Julien Nordmann discussed we will likely need to change his treatment to chemotherapy. Dr. Julien Nordmann also discussed the importance of not missing treatment.   He will continue to use his stool softener for constipation.   The patient knows that we strongly encourage smoking cessation. He is not interested in quitting at this time.  We will monitor his TSH closely and make adjustments as necessary. TSH pending at this time.  The patient was advised to call immediately if he has any concerning symptoms in the interval. The patient voices understanding of current disease status and treatment options and is in agreement with the current care plan. All questions were answered. The patient knows to call  the clinic with any problems, questions or concerns. We can certainly see the patient much sooner if necessary    No orders of the defined types were placed in this encounter.    Milana Salay L Erma Joubert, PA-C 12/22/20  ADDENDUM: Hematology/Oncology Attending: I had a face-to-face encounter with the patient today.  I reviewed his record, scans and recommended his care plan.  This is a very pleasant 60 years old white male with recurrent non-small cell lung cancer, adenosquamous carcinoma with PD-L1 expression of 99% and no actionable mutations.  The patient was initially diagnosed as stage IIIa non-small cell lung cancer, adenosquamous carcinoma status post a course of concurrent chemoradiation with partial response followed by consolidation treatment with immunotherapy with Imfinzi but after discontinuation of his treatment he developed disease progression.  He started treatment with Keytruda as a single agent status post 9 cycles.  The patient missed several cycles in the last 2-3 months because he had to travel to [place] to take care of his  parents. He had repeat CT scan of the chest performed recently.  I personally and independently reviewed the scan images and discussed the result and showed the images to the patient and his wife. Unfortunately his scan showed evidence for disease progression with increased consolidation in the right upper lobe lung mass. He also complained of seizure-like activity and MRI of the brain performed recently was negative for metastatic disease to the brain. I had a lengthy discussion with the patient and his wife today about his current condition and treatment options. I do not think the patient received appropriate and then off treatment with immunotherapy in the last few months and this may explain the recent disease progression in the right upper lobe. I recommended for the patient to resume his treatment with Keytruda 200 MG IV every 3 weeks and to take it as scheduled and planned with no scabbing of the treatment.  We will repeat his scan after 3 cycles of treatment to see if the patient has any benefit from the treatment.  If he has evidence for disease progression on the upcoming scan, I will consider him for other treatment options including systemic chemotherapy. The patient was advised to call immediately if he has any other concerning symptoms in the interval. The total time spent in the appointment was 35 minutes.  Disclaimer: This note was dictated with voice recognition software. Similar sounding words can inadvertently be transcribed and may be missed upon review. Eilleen Kempf, MD 12/22/20

## 2020-12-17 NOTE — Progress Notes (Signed)
Patient here today for MRI brain w/wo. Patient has [place]. [facility] express sent. Orders received for OVO. Will re-program once scan is complete

## 2020-12-17 NOTE — Progress Notes (Signed)
Informed of MRI for today.   Device system confirmed to be MRI conditional, with implant date > 6 weeks ago and no evidence of abandoned or epicardial leads in review of most recent CXR Interrogation from today reviewed, pt is currently VS at 70 bpm  Change device settings for MRI to OVO  Tachy-therapies to off if applicable.  Program device back to pre-MRI settings after completion of exam.  [name]  12/17/2020 12:44 PM

## 2020-12-22 ENCOUNTER — Inpatient Hospital Stay (HOSPITAL_BASED_OUTPATIENT_CLINIC_OR_DEPARTMENT_OTHER): Payer: Medicare Other | Admitting: Physician Assistant

## 2020-12-22 ENCOUNTER — Inpatient Hospital Stay: Payer: Medicare Other | Attending: Physician Assistant

## 2020-12-22 ENCOUNTER — Encounter: Payer: Self-pay | Admitting: Physician Assistant

## 2020-12-22 ENCOUNTER — Inpatient Hospital Stay: Payer: Medicare Other

## 2020-12-22 ENCOUNTER — Other Ambulatory Visit: Payer: Self-pay

## 2020-12-22 VITALS — BP 100/79 | HR 73 | Temp 97.6°F | Resp 19 | Ht 72.0 in | Wt 215.7 lb

## 2020-12-22 DIAGNOSIS — I252 Old myocardial infarction: Secondary | ICD-10-CM | POA: Diagnosis not present

## 2020-12-22 DIAGNOSIS — C3411 Malignant neoplasm of upper lobe, right bronchus or lung: Secondary | ICD-10-CM | POA: Insufficient documentation

## 2020-12-22 DIAGNOSIS — C3491 Malignant neoplasm of unspecified part of right bronchus or lung: Secondary | ICD-10-CM

## 2020-12-22 DIAGNOSIS — Z923 Personal history of irradiation: Secondary | ICD-10-CM | POA: Diagnosis not present

## 2020-12-22 DIAGNOSIS — Z86718 Personal history of other venous thrombosis and embolism: Secondary | ICD-10-CM | POA: Diagnosis not present

## 2020-12-22 DIAGNOSIS — Z5112 Encounter for antineoplastic immunotherapy: Secondary | ICD-10-CM | POA: Insufficient documentation

## 2020-12-22 DIAGNOSIS — F17210 Nicotine dependence, cigarettes, uncomplicated: Secondary | ICD-10-CM | POA: Insufficient documentation

## 2020-12-22 DIAGNOSIS — C3490 Malignant neoplasm of unspecified part of unspecified bronchus or lung: Secondary | ICD-10-CM | POA: Insufficient documentation

## 2020-12-22 DIAGNOSIS — Z79899 Other long term (current) drug therapy: Secondary | ICD-10-CM | POA: Insufficient documentation

## 2020-12-22 DIAGNOSIS — K5900 Constipation, unspecified: Secondary | ICD-10-CM | POA: Insufficient documentation

## 2020-12-22 DIAGNOSIS — Z95828 Presence of other vascular implants and grafts: Secondary | ICD-10-CM

## 2020-12-22 DIAGNOSIS — I2510 Atherosclerotic heart disease of native coronary artery without angina pectoris: Secondary | ICD-10-CM | POA: Insufficient documentation

## 2020-12-22 DIAGNOSIS — Z7982 Long term (current) use of aspirin: Secondary | ICD-10-CM | POA: Diagnosis not present

## 2020-12-22 LAB — CMP (CANCER CENTER ONLY)
ALT: 59 U/L — ABNORMAL HIGH (ref 0–44)
AST: 42 U/L — ABNORMAL HIGH (ref 15–41)
Albumin: 3 g/dL — ABNORMAL LOW (ref 3.5–5.0)
Alkaline Phosphatase: 128 U/L — ABNORMAL HIGH (ref 38–126)
Anion gap: 10 (ref 5–15)
BUN: 9 mg/dL (ref 6–20)
CO2: 29 mmol/L (ref 22–32)
Calcium: 8.8 mg/dL — ABNORMAL LOW (ref 8.9–10.3)
Chloride: 96 mmol/L — ABNORMAL LOW (ref 98–111)
Creatinine: 0.89 mg/dL (ref 0.61–1.24)
GFR, Estimated: >60 mL/min (ref >60)
Glucose, Bld: 104 mg/dL — ABNORMAL HIGH (ref 70–99)
Potassium: 4.2 mmol/L (ref 3.5–5.1)
Sodium: 135 mmol/L (ref 135–145)
Total Bilirubin: 0.3 mg/dL (ref 0.3–1.2)
Total Protein: 7 g/dL (ref 6.5–8.1)

## 2020-12-22 LAB — CBC WITH DIFFERENTIAL (CANCER CENTER ONLY)
Abs Immature Granulocytes: 0.15 10*3/uL — ABNORMAL HIGH (ref 0.00–0.07)
Basophils Absolute: 0.1 10*3/uL (ref 0.0–0.1)
Basophils Relative: 1 %
Eosinophils Absolute: 0.3 10*3/uL (ref 0.0–0.5)
Eosinophils Relative: 3 %
HCT: 37 % — ABNORMAL LOW (ref 39.0–52.0)
Hemoglobin: 11.6 g/dL — ABNORMAL LOW (ref 13.0–17.0)
Immature Granulocytes: 2 %
Lymphocytes Relative: 6 %
Lymphs Abs: 0.7 10*3/uL (ref 0.7–4.0)
MCH: 27.8 pg (ref 26.0–34.0)
MCHC: 31.4 g/dL (ref 30.0–36.0)
MCV: 88.5 fL (ref 80.0–100.0)
Monocytes Absolute: 0.8 10*3/uL (ref 0.1–1.0)
Monocytes Relative: 8 %
Neutro Abs: 8.3 10*3/uL — ABNORMAL HIGH (ref 1.7–7.7)
Neutrophils Relative %: 80 %
Platelet Count: 322 10*3/uL (ref 150–400)
RBC: 4.18 MIL/uL — ABNORMAL LOW (ref 4.22–5.81)
RDW: 15.3 % (ref 11.5–15.5)
WBC Count: 10.3 10*3/uL (ref 4.0–10.5)
nRBC: 0 % (ref 0.0–0.2)

## 2020-12-22 LAB — TSH: TSH: 32.089 u[IU]/mL — ABNORMAL HIGH (ref 0.320–4.118)

## 2020-12-22 MED ORDER — SODIUM CHLORIDE 0.9 % IV SOLN
200.0000 mg | Freq: Once | INTRAVENOUS | Status: AC
  Administered 2020-12-22: 200 mg via INTRAVENOUS
  Filled 2020-12-22: qty 8

## 2020-12-22 MED ORDER — HEPARIN SOD (PORK) LOCK FLUSH 100 UNIT/ML IV SOLN
500.0000 [IU] | Freq: Once | INTRAVENOUS | Status: AC | PRN
  Administered 2020-12-22: 500 [IU]
  Filled 2020-12-22: qty 5

## 2020-12-22 MED ORDER — SODIUM CHLORIDE 0.9 % IV SOLN
Freq: Once | INTRAVENOUS | Status: AC
  Filled 2020-12-22: qty 250

## 2020-12-22 MED ORDER — SODIUM CHLORIDE 0.9% FLUSH
10.0000 mL | Freq: Once | INTRAVENOUS | Status: AC
  Administered 2020-12-22: 10 mL
  Filled 2020-12-22: qty 10

## 2020-12-22 MED ORDER — ALTEPLASE 2 MG IJ SOLR
2.0000 mg | Freq: Once | INTRAMUSCULAR | Status: AC
  Administered 2020-12-22: 2 mg
  Filled 2020-12-22: qty 2

## 2020-12-22 MED ORDER — SODIUM CHLORIDE 0.9% FLUSH
10.0000 mL | INTRAVENOUS | Status: DC | PRN
  Administered 2020-12-22: 10 mL
  Filled 2020-12-22: qty 10

## 2020-12-22 NOTE — Patient Instructions (Signed)
Implanted Port Insertion, Care After This sheet gives you information about how to care for yourself after your procedure. Your health care provider may also give you more specific instructions. If you have problems or questions, contact your health care provider. What can I expect after the procedure? After the procedure, it is common to have:  Discomfort at the port insertion site.  Bruising on the skin over the port. This should improve over 3-4 days. Follow these instructions at home: Port care  After your port is placed, you will get a manufacturer's information card. The card has information about your port. Keep this card with you at all times.  Take care of the port as told by your health care provider. Ask your health care provider if you or a family member can get training for taking care of the port at home. A home health care nurse may also take care of the port.  Make sure to remember what type of port you have. Incision care  Follow instructions from your health care provider about how to take care of your port insertion site. Make sure you: ? Wash your hands with soap and water before and after you change your bandage (dressing). If soap and water are not available, use hand sanitizer. ? Change your dressing as told by your health care provider. ? Leave stitches (sutures), skin glue, or adhesive strips in place. These skin closures may need to stay in place for 2 weeks or longer. If adhesive strip edges start to loosen and curl up, you may trim the loose edges. Do not remove adhesive strips completely unless your health care provider tells you to do that.  Check your port insertion site every day for signs of infection. Check for: ? Redness, swelling, or pain. ? Fluid or blood. ? Warmth. ? Pus or a bad smell.      Activity  Return to your normal activities as told by your health care provider. Ask your health care provider what activities are safe for you.  Do not  lift anything that is heavier than 10 lb (4.5 kg), or the limit that you are told, until your health care provider says that it is safe. General instructions  Take over-the-counter and prescription medicines only as told by your health care provider.  Do not take baths, swim, or use a hot tub until your health care provider approves. Ask your health care provider if you may take showers. You may only be allowed to take sponge baths.  Do not drive for 24 hours if you were given a sedative during your procedure.  Wear a medical alert bracelet in case of an emergency. This will tell any health care providers that you have a port.  Keep all follow-up visits as told by your health care provider. This is important. Contact a health care provider if:  You cannot flush your port with saline as directed, or you cannot draw blood from the port.  You have a fever or chills.  You have redness, swelling, or pain around your port insertion site.  You have fluid or blood coming from your port insertion site.  Your port insertion site feels warm to the touch.  You have pus or a bad smell coming from the port insertion site. Get help right away if:  You have chest pain or shortness of breath.  You have bleeding from your port that you cannot control. Summary  Take care of the port as told by your   health care provider. Keep the manufacturer's information card with you at all times.  Change your dressing as told by your health care provider.  Contact a health care provider if you have a fever or chills or if you have redness, swelling, or pain around your port insertion site.  Keep all follow-up visits as told by your health care provider. This information is not intended to replace advice given to you by your health care provider. Make sure you discuss any questions you have with your health care provider. Document Revised: 04/11/2018 Document Reviewed: 04/11/2018 Elsevier Patient Education   2021 [employer] Inc.  

## 2020-12-22 NOTE — Patient Instructions (Signed)
[facility] Discharge Instructions for Patients Receiving Chemotherapy  Today you received the following chemotherapy agents:  Keytruda.  To help prevent nausea and vomiting after your treatment, we encourage you to take your nausea medication as directed.   If you develop nausea and vomiting that is not controlled by your nausea medication, call the clinic.   BELOW ARE SYMPTOMS THAT SHOULD BE REPORTED IMMEDIATELY:  *FEVER GREATER THAN 100.5 F  *CHILLS WITH OR WITHOUT FEVER  NAUSEA AND VOMITING THAT IS NOT CONTROLLED WITH YOUR NAUSEA MEDICATION  *UNUSUAL SHORTNESS OF BREATH  *UNUSUAL BRUISING OR BLEEDING  TENDERNESS IN MOUTH AND THROAT WITH OR WITHOUT PRESENCE OF ULCERS  *URINARY PROBLEMS  *BOWEL PROBLEMS  UNUSUAL RASH Items with * indicate a potential emergency and should be followed up as soon as possible.  Feel free to call the clinic should you have any questions or concerns. The clinic phone number is [phone].  Please show the CHEMO ALERT CARD at check-in to the Emergency Department and triage nurse.    

## 2020-12-22 NOTE — Progress Notes (Signed)
Unable to get blood flow from port. Pt was given cathflo at 1034 by Corene Cornea, [place].Pt sent back to lab for peripheral blood draw.

## 2020-12-23 ENCOUNTER — Telehealth: Payer: Self-pay | Admitting: Physician Assistant

## 2020-12-23 NOTE — Telephone Encounter (Signed)
Added appts per los. Called and left msg. Mailed printout

## 2020-12-24 DIAGNOSIS — E7849 Other hyperlipidemia: Secondary | ICD-10-CM | POA: Diagnosis not present

## 2020-12-24 DIAGNOSIS — J449 Chronic obstructive pulmonary disease, unspecified: Secondary | ICD-10-CM | POA: Diagnosis not present

## 2020-12-24 DIAGNOSIS — I10 Essential (primary) hypertension: Secondary | ICD-10-CM | POA: Diagnosis not present

## 2021-01-12 ENCOUNTER — Other Ambulatory Visit: Payer: Self-pay | Admitting: Internal Medicine

## 2021-01-12 ENCOUNTER — Inpatient Hospital Stay: Payer: Medicare Other | Admitting: Nutrition

## 2021-01-12 ENCOUNTER — Inpatient Hospital Stay: Payer: Medicare Other

## 2021-01-12 ENCOUNTER — Other Ambulatory Visit: Payer: Self-pay

## 2021-01-12 ENCOUNTER — Inpatient Hospital Stay: Payer: Medicare Other | Attending: Internal Medicine

## 2021-01-12 ENCOUNTER — Inpatient Hospital Stay (HOSPITAL_BASED_OUTPATIENT_CLINIC_OR_DEPARTMENT_OTHER): Payer: Medicare Other | Admitting: Internal Medicine

## 2021-01-12 ENCOUNTER — Encounter: Payer: Self-pay | Admitting: Internal Medicine

## 2021-01-12 VITALS — BP 92/59 | HR 67

## 2021-01-12 VITALS — BP 89/76 | HR 68 | Temp 97.4°F | Resp 19 | Ht 72.0 in | Wt 212.5 lb

## 2021-01-12 DIAGNOSIS — Z79899 Other long term (current) drug therapy: Secondary | ICD-10-CM | POA: Insufficient documentation

## 2021-01-12 DIAGNOSIS — C3490 Malignant neoplasm of unspecified part of unspecified bronchus or lung: Secondary | ICD-10-CM | POA: Diagnosis not present

## 2021-01-12 DIAGNOSIS — Z86718 Personal history of other venous thrombosis and embolism: Secondary | ICD-10-CM | POA: Diagnosis not present

## 2021-01-12 DIAGNOSIS — F419 Anxiety disorder, unspecified: Secondary | ICD-10-CM | POA: Diagnosis not present

## 2021-01-12 DIAGNOSIS — Z5112 Encounter for antineoplastic immunotherapy: Secondary | ICD-10-CM

## 2021-01-12 DIAGNOSIS — I252 Old myocardial infarction: Secondary | ICD-10-CM | POA: Diagnosis not present

## 2021-01-12 DIAGNOSIS — E039 Hypothyroidism, unspecified: Secondary | ICD-10-CM | POA: Insufficient documentation

## 2021-01-12 DIAGNOSIS — Z923 Personal history of irradiation: Secondary | ICD-10-CM | POA: Diagnosis not present

## 2021-01-12 DIAGNOSIS — J449 Chronic obstructive pulmonary disease, unspecified: Secondary | ICD-10-CM | POA: Insufficient documentation

## 2021-01-12 DIAGNOSIS — C3491 Malignant neoplasm of unspecified part of right bronchus or lung: Secondary | ICD-10-CM

## 2021-01-12 DIAGNOSIS — Z95828 Presence of other vascular implants and grafts: Secondary | ICD-10-CM

## 2021-01-12 DIAGNOSIS — C3411 Malignant neoplasm of upper lobe, right bronchus or lung: Secondary | ICD-10-CM | POA: Diagnosis not present

## 2021-01-12 LAB — CMP (CANCER CENTER ONLY)
ALT: 47 U/L — ABNORMAL HIGH (ref 0–44)
AST: 48 U/L — ABNORMAL HIGH (ref 15–41)
Albumin: 2.7 g/dL — ABNORMAL LOW (ref 3.5–5.0)
Alkaline Phosphatase: 167 U/L — ABNORMAL HIGH (ref 38–126)
Anion gap: 11 (ref 5–15)
BUN: 9 mg/dL (ref 6–20)
CO2: 29 mmol/L (ref 22–32)
Calcium: 8.6 mg/dL — ABNORMAL LOW (ref 8.9–10.3)
Chloride: 94 mmol/L — ABNORMAL LOW (ref 98–111)
Creatinine: 0.91 mg/dL (ref 0.61–1.24)
GFR, Estimated: >60 mL/min (ref >60)
Glucose, Bld: 89 mg/dL (ref 70–99)
Potassium: 4.4 mmol/L (ref 3.5–5.1)
Sodium: 134 mmol/L — ABNORMAL LOW (ref 135–145)
Total Bilirubin: 0.3 mg/dL (ref 0.3–1.2)
Total Protein: 6.7 g/dL (ref 6.5–8.1)

## 2021-01-12 LAB — CBC WITH DIFFERENTIAL (CANCER CENTER ONLY)
Abs Immature Granulocytes: 0.3 10*3/uL — ABNORMAL HIGH (ref 0.00–0.07)
Basophils Absolute: 0.1 10*3/uL (ref 0.0–0.1)
Basophils Relative: 1 %
Eosinophils Absolute: 0.4 10*3/uL (ref 0.0–0.5)
Eosinophils Relative: 3 %
HCT: 35.4 % — ABNORMAL LOW (ref 39.0–52.0)
Hemoglobin: 11.1 g/dL — ABNORMAL LOW (ref 13.0–17.0)
Immature Granulocytes: 3 %
Lymphocytes Relative: 7 %
Lymphs Abs: 0.8 10*3/uL (ref 0.7–4.0)
MCH: 27.3 pg (ref 26.0–34.0)
MCHC: 31.4 g/dL (ref 30.0–36.0)
MCV: 87 fL (ref 80.0–100.0)
Monocytes Absolute: 0.9 10*3/uL (ref 0.1–1.0)
Monocytes Relative: 8 %
Neutro Abs: 9.1 10*3/uL — ABNORMAL HIGH (ref 1.7–7.7)
Neutrophils Relative %: 78 %
Platelet Count: 357 10*3/uL (ref 150–400)
RBC: 4.07 MIL/uL — ABNORMAL LOW (ref 4.22–5.81)
RDW: 15.2 % (ref 11.5–15.5)
WBC Count: 11.6 10*3/uL — ABNORMAL HIGH (ref 4.0–10.5)
nRBC: 0 % (ref 0.0–0.2)

## 2021-01-12 LAB — TSH: TSH: 26.784 u[IU]/mL — ABNORMAL HIGH (ref 0.320–4.118)

## 2021-01-12 MED ORDER — ALTEPLASE 2 MG IJ SOLR
2.0000 mg | Freq: Once | INTRAMUSCULAR | Status: AC
  Administered 2021-01-12: 2 mg
  Filled 2021-01-12: qty 2

## 2021-01-12 MED ORDER — SODIUM CHLORIDE 0.9% FLUSH
10.0000 mL | INTRAVENOUS | Status: DC | PRN
  Administered 2021-01-12: 10 mL
  Filled 2021-01-12: qty 10

## 2021-01-12 MED ORDER — SODIUM CHLORIDE 0.9 % IV SOLN
Freq: Once | INTRAVENOUS | Status: AC
Start: 2021-01-12 — End: 2021-01-12
  Filled 2021-01-12: qty 250

## 2021-01-12 MED ORDER — SODIUM CHLORIDE 0.9 % IV SOLN
200.0000 mg | Freq: Once | INTRAVENOUS | Status: AC
  Administered 2021-01-12: 200 mg via INTRAVENOUS
  Filled 2021-01-12: qty 8

## 2021-01-12 MED ORDER — SODIUM CHLORIDE 0.9 % IV SOLN
Freq: Once | INTRAVENOUS | Status: DC
  Filled 2021-01-12: qty 250

## 2021-01-12 MED ORDER — HEPARIN SOD (PORK) LOCK FLUSH 100 UNIT/ML IV SOLN
500.0000 [IU] | Freq: Once | INTRAVENOUS | Status: AC | PRN
  Administered 2021-01-12: 500 [IU]
  Filled 2021-01-12: qty 5

## 2021-01-12 NOTE — Progress Notes (Signed)
Per Dr. Julien Nordmann, ok to treat with BP.  Fluid bolus of 536mL to be given with treatment.

## 2021-01-12 NOTE — Progress Notes (Signed)
Patient was identified to be at risk for malnutrition on the MST secondary to poor appetite and weight loss.  60 year old male diagnosed with recurrent non-small cell lung cancer followed by Dr. Julien Nordmann receiving Beryle Flock.  Past medical history includes STEMI, PVD, CAD, hep C, depression, COPD, and anxiety.  Medications include Xanax, Celexa, Lasix, Synthroid, Compazine, and Carafate.  Labs include sodium 134, albumin 2.7, TSH 26.78.  Height: 6 feet 0 inches. Weight: 212 pounds April 18. Usual body weight: 235-240 pounds. BMI: 28.82.  Noted right bilateral knee amputation.  No edema.  Patient reports his appetite is good and he denies nausea and vomiting.  Reports some issues with constipation but it is okay as long as he takes stool softeners. Patient is very drowsy related to Xanax and pain medications per RN.  Nutrition diagnosis:  Unintended weight loss related to recurrent cancer and associated treatments as evidenced by 10% weight loss from usual body weight.  Intervention: Educated patient to consume smaller more frequent meals and snacks with increased calories and protein. Discouraged further weight loss. Recommended patient try oral nutrition supplements.  Continue smaller more frequent meals with snacks.  Monitoring, evaluation, goals: Patient will tolerate adequate calories and protein to minimize further weight loss.  Next visit: Will monitor scheduled follow-up as needed.  Please refer back to RD if nutrition problems are identified sooner.  **Disclaimer: This note was dictated with voice recognition software. Similar sounding words can inadvertently be transcribed and this note may contain transcription errors which may not have been corrected upon publication of note.**

## 2021-01-12 NOTE — Patient Instructions (Signed)
[facility] Discharge Instructions for Patients Receiving Chemotherapy  Today you received the following chemotherapy agents:  Keytruda.  To help prevent nausea and vomiting after your treatment, we encourage you to take your nausea medication as directed.   If you develop nausea and vomiting that is not controlled by your nausea medication, call the clinic.   BELOW ARE SYMPTOMS THAT SHOULD BE REPORTED IMMEDIATELY:  *FEVER GREATER THAN 100.5 F  *CHILLS WITH OR WITHOUT FEVER  NAUSEA AND VOMITING THAT IS NOT CONTROLLED WITH YOUR NAUSEA MEDICATION  *UNUSUAL SHORTNESS OF BREATH  *UNUSUAL BRUISING OR BLEEDING  TENDERNESS IN MOUTH AND THROAT WITH OR WITHOUT PRESENCE OF ULCERS  *URINARY PROBLEMS  *BOWEL PROBLEMS  UNUSUAL RASH Items with * indicate a potential emergency and should be followed up as soon as possible.  Feel free to call the clinic should you have any questions or concerns. The clinic phone number is [phone].  Please show the CHEMO ALERT CARD at check-in to the Emergency Department and triage nurse.    

## 2021-01-12 NOTE — Patient Instructions (Signed)
Steps to Quit Smoking Smoking tobacco is the leading cause of preventable death. It can affect almost every organ in the body. Smoking puts you and people around you at risk for many serious, long-lasting (chronic) diseases. Quitting smoking can be hard, but it is one of the best things that you can do for your health. It is never too late to quit. How do I get ready to quit? When you decide to quit smoking, make a plan to help you succeed. Before you quit:  Pick a date to quit. Set a date within the next 2 weeks to give you time to prepare.  Write down the reasons why you are quitting. Keep this list in places where you will see it often.  Tell your family, friends, and co-workers that you are quitting. Their support is important.  Talk with your doctor about the choices that may help you quit.  Find out if your health insurance will pay for these treatments.  Know the people, places, things, and activities that make you want to smoke (triggers). Avoid them. What first steps can I take to quit smoking?  Throw away all cigarettes at home, at work, and in your car.  Throw away the things that you use when you smoke, such as ashtrays and lighters.  Clean your car. Make sure to empty the ashtray.  Clean your home, including curtains and carpets. What can I do to help me quit smoking? Talk with your doctor about taking medicines and seeing a counselor at the same time. You are more likely to succeed when you do both.  If you are pregnant or breastfeeding, talk with your doctor about counseling or other ways to quit smoking. Do not take medicine to help you quit smoking unless your doctor tells you to do so. To quit smoking: Quit right away  Quit smoking totally, instead of slowly cutting back on how much you smoke over a period of time.  Go to counseling. You are more likely to quit if you go to counseling sessions regularly. Take medicine You may take medicines to help you quit. Some  medicines need a prescription, and some you can buy over-the-counter. Some medicines may contain a drug called nicotine to replace the nicotine in cigarettes. Medicines may:  Help you to stop having the desire to smoke (cravings).  Help to stop the problems that come when you stop smoking (withdrawal symptoms). Your doctor may ask you to use:  Nicotine patches, gum, or lozenges.  Nicotine inhalers or sprays.  Non-nicotine medicine that is taken by mouth. Find resources Find resources and other ways to help you quit smoking and remain smoke-free after you quit. These resources are most helpful when you use them often. They include:  Online chats with a counselor.  Phone quitlines.  Printed self-help materials.  Support groups or group counseling.  Text messaging programs.  Mobile phone apps. Use apps on your mobile phone or tablet that can help you stick to your quit plan. There are many free apps for mobile phones and tablets as well as websites. Examples include Quit Guide from the CDC and smokefree.gov   What things can I do to make it easier to quit?  Talk to your family and friends. Ask them to support and encourage you.  Call a phone quitline (1-800-QUIT-NOW), reach out to support groups, or work with a counselor.  Ask people who smoke to not smoke around you.  Avoid places that make you want to smoke,   such as: ? Bars. ? Parties. ? Smoke-break areas at work.  Spend time with people who do not smoke.  Lower the stress in your life. Stress can make you want to smoke. Try these things to help your stress: ? Getting regular exercise. ? Doing deep-breathing exercises. ? Doing yoga. ? Meditating. ? Doing a body scan. To do this, close your eyes, focus on one area of your body at a time from head to toe. Notice which parts of your body are tense. Try to relax the muscles in those areas.   How will I feel when I quit smoking? Day 1 to 3 weeks Within the first 24 hours,  you may start to have some problems that come from quitting tobacco. These problems are very bad 2-3 days after you quit, but they do not often last for more than 2-3 weeks. You may get these symptoms:  Mood swings.  Feeling restless, nervous, angry, or annoyed.  Trouble concentrating.  Dizziness.  Strong desire for high-sugar foods and nicotine.  Weight gain.  Trouble pooping (constipation).  Feeling like you may vomit (nausea).  Coughing or a sore throat.  Changes in how the medicines that you take for other issues work in your body.  Depression.  Trouble sleeping (insomnia). Week 3 and afterward After the first 2-3 weeks of quitting, you may start to notice more positive results, such as:  Better sense of smell and taste.  Less coughing and sore throat.  Slower heart rate.  Lower blood pressure.  Clearer skin.  Better breathing.  Fewer sick days. Quitting smoking can be hard. Do not give up if you fail the first time. Some people need to try a few times before they succeed. Do your best to stick to your quit plan, and talk with your doctor if you have any questions or concerns. Summary  Smoking tobacco is the leading cause of preventable death. Quitting smoking can be hard, but it is one of the best things that you can do for your health.  When you decide to quit smoking, make a plan to help you succeed.  Quit smoking right away, not slowly over a period of time.  When you start quitting, seek help from your doctor, family, or friends. This information is not intended to replace advice given to you by your health care provider. Make sure you discuss any questions you have with your health care provider. Document Revised: 06/08/2019 Document Reviewed: 12/02/2018 Elsevier Patient Education  2021 [employer].  

## 2021-01-12 NOTE — Progress Notes (Signed)
[facility] [phone]   [phone]  OFFICE PROGRESS NOTE  [place] [employer], [place] [address]  DIAGNOSIS:stage IIIA (T3, N2, M0) non-small cell lung cancer, adenosquamous carcinoma diagnosed in June 2019 and presented with large right upper lobe lung mass with questionable chest wall invasion as well as right hilar and mediastinal lymphadenopathy.  Biomarker Findings Tumor Mutational Burden - TMB-High (21 Muts/Mb) Microsatellite status - MS-Stable Genomic Findings For a complete list of the genes assayed, please refer to the Appendix. ATM N1916* CCND2 P281R KRAS G12V CHEK2 T361fs*15 TP53 E298* 7 Disease relevant genes with no reportable alterations: ALK, EGFR, BRAF, MET, RET, ERBB2, ROS1  PDL1 Expression: 99%.  PRIOR THERAPY: 1) Concurrent chemoradiation with chemotherapy consisting of weekly carboplatin for an AUC of 2 and paclitaxel 45 mg/m2. First dose given on 04/03/2018.  Status post 6 cycles.  Last dose was giving 05/15/2018 with partial response. 2) Consolidation treatment with immunotherapy with Imfinzi (Durvalumab) 10 mg/KG every 2 weeks.  First dose 06/20/2018.  Status post 26 cycles.  CURRENT THERAPY:Systemic treatment with immunotherapy with Keytruda 200 mg IV every 3 weeks.  First dose February 27, 2020.  Status post 10 cycles.   INTERVAL HISTORY: [name] 60 y.o. male returns to the clinic today for follow-up visit.  The patient is feeling fine today with no concerning complaints except for fatigue.  He took Xanax earlier today because of the concern about the way his wife drives.  He denied having any current chest pain, shortness of breath, cough or hemoptysis.  He denied having any fever or chills.  He denied having any significant weight loss or night sweats.  He has no headache or visual changes.  He tolerated the last cycle of his treatment with Keytruda fairly well.  The patient is here today  for evaluation before starting cycle #11 of his treatment.  MEDICAL HISTORY: Past Medical History:  Diagnosis Date  . Acute hepatitis C virus infection 06/19/2007   Qualifier: Diagnosis of  By: Leilani Merl CMA, Tiffany    . Acute ST elevation myocardial infarction (STEMI) involving left anterior descending (LAD) coronary artery ([place]) 05/26/2016  . Acute ST elevation myocardial infarction (STEMI) of lateral wall ([place]) 05/26/2016  . 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 Anxiety   . Asthma   . Atherosclerosis of native arteries of the extremities with intermittent claudication 12/16/2011  . Chronic hepatitis C without hepatic coma ([place]) 05/03/2014  . COPD with chronic bronchitis and emphysema ([place]) 03/13/2018   FEV1 57%  . Depression   . DVT (deep venous thrombosis) ([place]) 2009; 2019   RLE; LLE  . Encounter for antineoplastic chemotherapy 03/22/2018  . Hepatitis C    "tx'd in 2015"  . High cholesterol   . Ischemic cardiomyopathy 03/02/2018  . Non-small cell carcinoma of right lung, stage 3 ([place]) 03/22/2018  . PAD (peripheral artery disease) ([place]) 01/25/2013  . Peripheral vascular disease, unspecified 07/20/2012  . Port-A-Cath in place 04/24/2018  . ST elevation myocardial infarction involving left anterior descending (LAD) coronary artery ([place])   . STEMI (ST elevation myocardial infarction) ([place]) 05/26/2016  . Tobacco abuse 01/28/2016  . Tubular adenoma of colon 07/11/2014  . Urinary dribbling     ALLERGIES:  is allergic to lyrica [pregabalin] and neurontin [gabapentin].  MEDICATIONS:  Current Outpatient Medications  Medication Sig Dispense Refill  . albuterol (VENTOLIN HFA) 108 (90 Base) MCG/ACT inhaler SMARTSIG:1-2 Puff(s) Via Inhaler Every 4 Hours PRN    .  alprazolam (XANAX) 2 MG tablet Take 2 mg by mouth 4 (four) times daily as needed for anxiety.    [name] amitriptyline (ELAVIL) 100 MG tablet Take 100 mg by mouth at bedtime.     [name] aspirin 81 MG EC tablet Take 81 mg by  mouth daily.    [name] atorvastatin (LIPITOR) 80 MG tablet Take 1 tablet (80 mg total) by mouth daily at 6 PM. 30 tablet 12  . carvedilol (COREG) 6.25 MG tablet TAKE 1 TABLET (6.25 MG TOTAL) BY MOUTH 2 (TWO) TIMES DAILY WITH A MEAL. OFFICE VISIT NEEDED 180 tablet 2  . citalopram (CELEXA) 40 MG tablet Take 40 mg by mouth daily.  11  . furosemide (LASIX) 20 MG tablet Take 1 tablet (20 mg total) by mouth daily. Please make overdue appt with Dr. Oval Linsey before anymore refills. 2nd attempt 15 tablet 0  . HYDROcodone-acetaminophen (NORCO) 10-325 MG tablet Take 1 tablet by mouth every 4 (four) hours as needed for moderate pain.   0  . levothyroxine (SYNTHROID) 125 MCG tablet Take 125 mcg by mouth daily.    [name] lidocaine-prilocaine (EMLA) cream Apply 1 application topically as needed. 30 g 0  . lisinopril (PRINIVIL,ZESTRIL) 2.5 MG tablet Take 1 tablet (2.5 mg total) by mouth daily. 30 tablet 12  . nitroGLYCERIN (NITROSTAT) 0.4 MG SL tablet Place 1 tablet (0.4 mg total) under the tongue every 5 (five) minutes x 3 doses as needed for chest pain. 25 tablet 2  . prochlorperazine (COMPAZINE) 10 MG tablet Take 1 tablet (10 mg total) by mouth every 6 (six) hours as needed for nausea or vomiting. 30 tablet 0  . spironolactone (ALDACTONE) 25 MG tablet TAKE 1 TABLET BY MOUTH EVERY DAY 90 tablet 0  . sucralfate (CARAFATE) 1 g tablet Take 1 tablet (1 g total) by mouth 4 (four) times daily -  with meals and at bedtime. 5 min before meals for radiation induced esophagitis 120 tablet 2  . tamsulosin (FLOMAX) 0.4 MG CAPS capsule Take 1 capsule by mouth daily.    . TRELEGY ELLIPTA 100-62.5-25 MCG/INH AEPB Inhale 1 puff into the lungs daily.     No current facility-administered medications for this visit.   Facility-Administered Medications Ordered in Other Visits  Medication Dose Route Frequency Provider Last Rate Last Admin  . sodium chloride flush (NS) 0.9 % injection 10 mL  10 mL Intracatheter PRN Curt Bears, MD   10  mL at 03/19/20 1544    SURGICAL HISTORY:  Past Surgical History:  Procedure Laterality Date  . AORTOGRAM  08/08/2009   for LLE claudication     By Dr. Oneida Alar  . 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 BIOPSY N/A 05/30/2014   Procedure: BIOPSY;  Surgeon: [name], MD;  Location: [facility];  Service: Endoscopy;  Laterality: N/A;  . BLADDER TUMOR EXCISION  8/812  . CARDIAC CATHETERIZATION N/A 05/26/2016   Procedure: Left Heart Cath and Coronary Angiography;  Surgeon: [name], MD;  Location: [facility];  Service: Cardiovascular;  Laterality: N/A;  . CARDIAC CATHETERIZATION N/A 05/26/2016   Procedure: Coronary Stent Intervention;  Surgeon: [name], MD;  Location: [place] [facility];  Service: Cardiovascular;  Laterality: N/A;  . COLONOSCOPY WITH PROPOFOL N/A 05/30/2014   IRS:WNIOEV colonic polyp-likely source of hematochezia-removed as described above  . ESOPHAGOGASTRODUODENOSCOPY (EGD) WITH PROPOFOL N/A 05/30/2014   OJJ:KKXFGH and bulbar erosions s/p gastric biopsy. No evidence of portal gastropathy on today's examination.  . FEMORAL-TIBIAL  BYPASS GRAFT  2009   Right side using non-reversed GSV   By Dr. Oneida Alar  . FEMORAL-TIBIAL BYPASS GRAFT  11/24/2007   Right femoral to anterior tibial BPG   by Dr. Oneida Alar  . FINGER SURGERY Left    "straightened my pinky"  . HAND TENDON SURGERY Left 2013   Left 5th finger  . HERNIA REPAIR     "stomach"  . ICD IMPLANT N/A 03/02/2018   Procedure: ICD IMPLANT;  Surgeon: [name], MD;  Location: [place] [facility] [place] [facility];  Service: Cardiovascular;  Laterality: N/A;  . INCISIONAL HERNIA REPAIR N/A 12/25/2014   Procedure: HERNIA REPAIR INCISIONAL WITH MESH;  Surgeon: [name], MD;  Location: [facility];  Service: General;  Laterality: N/A;  . INSERTION OF MESH N/A 12/25/2014   Procedure: INSERTION OF MESH;  Surgeon: [name], MD;  Location: [facility];  Service: General;  Laterality: N/A;  . IR IMAGING GUIDED PORT INSERTION   04/11/2018  . LAPAROSCOPIC CHOLECYSTECTOMY    . LOWER EXTREMITY ANGIOGRAM Left 12/30/2015   Procedure: Lower Extremity Angiogram;  Surgeon: [name], MD;  Location: [place] [facility];  Service: Cardiovascular;  Laterality: Left;  . PERIPHERAL VASCULAR CATHETERIZATION N/A 12/30/2015   Procedure: Abdominal Aortogram;  Surgeon: [name], MD;  Location: [facility];  Service: Cardiovascular;  Laterality: N/A;  . POLYPECTOMY  05/30/2014   Procedure: POLYPECTOMY;  Surgeon: [name], MD;  Location: [facility];  Service: Endoscopy;;  . TONSILLECTOMY AND ADENOIDECTOMY  ~ 1970  . TYMPANOSTOMY TUBE PLACEMENT Bilateral ~ 1970    REVIEW OF SYSTEMS:  A comprehensive review of systems was negative except for: Constitutional: positive for fatigue   PHYSICAL EXAMINATION: General appearance: alert, cooperative, fatigued and no distress Head: Normocephalic, without obvious abnormality, atraumatic Neck: no adenopathy, no JVD, supple, symmetrical, trachea midline and thyroid not enlarged, symmetric, no tenderness/mass/nodules Lymph nodes: Cervical, supraclavicular, and axillary nodes normal. Resp: clear to auscultation bilaterally Back: symmetric, no curvature. ROM normal. No CVA tenderness. Cardio: regular rate and rhythm, S1, S2 normal, no murmur, click, rub or gallop GI: soft, non-tender; bowel sounds normal; no masses,  no organomegaly Extremities: Right below knee amputation otherwise no edema.  ECOG PERFORMANCE STATUS: 1 - Symptomatic but completely ambulatory  Blood pressure (!) 89/76, pulse 68, temperature (!) 97.4 F (36.3 C), temperature source Tympanic, resp. rate 19, height 6' (1.829 m), weight 212 lb 8 oz (96.4 kg), SpO2 96 %.  LABORATORY DATA: Lab Results  Component Value Date   WBC 10.3 12/22/2020   HGB 11.6 (L) 12/22/2020   HCT 37.0 (L) 12/22/2020   MCV 88.5 12/22/2020   PLT 322 12/22/2020      Chemistry      Component Value Date/Time   NA 135 12/22/2020 0945    NA 139 02/27/2018 1457   K 4.2 12/22/2020 0945   CL 96 (L) 12/22/2020 0945   CO2 29 12/22/2020 0945   BUN 9 12/22/2020 0945   BUN 7 02/27/2018 1457   CREATININE 0.89 12/22/2020 0945   CREATININE 1.13 06/28/2016 1453      Component Value Date/Time   CALCIUM 8.8 (L) 12/22/2020 0945   ALKPHOS 128 (H) 12/22/2020 0945   AST 42 (H) 12/22/2020 0945   ALT 59 (H) 12/22/2020 0945   BILITOT 0.3 12/22/2020 0945       RADIOGRAPHIC STUDIES: MR Brain W Wo Contrast  Result Date: 12/18/2020 CLINICAL DATA:  Lung cancer with increased seizure-like activity EXAM: MRI  HEAD WITHOUT AND WITH CONTRAST TECHNIQUE: Multiplanar, multiecho pulse sequences of the brain and surrounding structures were obtained without and with intravenous contrast. CONTRAST:  82mL GADAVIST GADOBUTROL 1 MMOL/ML IV SOLN COMPARISON:  2019 FINDINGS: Brain: There is no acute infarction or intracranial hemorrhage. There is no intracranial mass, mass effect, or edema. There is no hydrocephalus or extra-axial fluid collection. Ventricles and sulci are within normal limits in size and configuration. Minimal patchy foci of T2 hyperintensity in the supratentorial white matter are nonspecific but may reflect minor chronic microvascular ischemic changes. No abnormal enhancement. Vascular: Major vessel flow voids at the skull base are preserved. Skull and upper cervical spine: Normal marrow signal is preserved. Sinuses/Orbits: Maxillary sinus retention cysts. Orbits are unremarkable. Other: Partially empty sella.  Patchy mastoid fluid opacification. IMPRESSION: No intracranial mass or abnormal enhancement. No significant change since 2019 examination. Electronically Signed   By: Macy Mis M.D.   On: 12/18/2020 09:03    ASSESSMENT AND PLAN: This is a very pleasant 60 years old white male recently diagnosed with a stage IIIa non-small cell lung cancer, adenosquamous carcinoma.  He underwent a course of concurrent chemoradiation with weekly  carboplatin and paclitaxel status post 6 cycles with partial response.  He has no actionable mutation but PD-L1 expression is 99%. The patient completed treatment with consolidation immunotherapy with Imfinzi status post 26 cycles.  He tolerated this treatment well with no concerning adverse effects. The patient was on observation for several months but he developed disease progression. He is currently undergoing treatment with systemic immunotherapy with Keytruda 200 mg IV every 3 weeks status post 10 cycles.  He missed a few cycles of his treatment because of traveling to [place] to take care of his parents.  He resumed his treatment 3 weeks ago and tolerated the last cycle fairly well. I recommended for him to proceed with cycle #11 today as planned. For the hypothyroidism, he is currently on levothyroxine 125 mcg p.o. daily and will continue to monitor his TSH closely and adjust his dose as needed. The patient will come back for follow-up visit in 3 weeks for evaluation before the next cycle of his treatment. He was advised to call immediately if he has any concerning symptoms in the interval.  The patient voices understanding of current disease status and treatment options and is in agreement with the current care plan. All questions were answered. The patient knows to call the clinic with any problems, questions or concerns. We can certainly see the patient much sooner if necessary.   Disclaimer: This note was dictated with voice recognition software. Similar sounding words can inadvertently be transcribed and may not be corrected upon review.

## 2021-01-24 DIAGNOSIS — I10 Essential (primary) hypertension: Secondary | ICD-10-CM | POA: Diagnosis not present

## 2021-01-24 DIAGNOSIS — E7849 Other hyperlipidemia: Secondary | ICD-10-CM | POA: Diagnosis not present

## 2021-01-24 DIAGNOSIS — J449 Chronic obstructive pulmonary disease, unspecified: Secondary | ICD-10-CM | POA: Diagnosis not present

## 2021-01-26 DIAGNOSIS — G894 Chronic pain syndrome: Secondary | ICD-10-CM | POA: Diagnosis not present

## 2021-02-02 ENCOUNTER — Inpatient Hospital Stay: Payer: Medicare Other

## 2021-02-02 ENCOUNTER — Inpatient Hospital Stay (HOSPITAL_BASED_OUTPATIENT_CLINIC_OR_DEPARTMENT_OTHER): Payer: Medicare Other | Admitting: Internal Medicine

## 2021-02-02 ENCOUNTER — Other Ambulatory Visit: Payer: Self-pay

## 2021-02-02 ENCOUNTER — Encounter: Payer: Self-pay | Admitting: Internal Medicine

## 2021-02-02 ENCOUNTER — Inpatient Hospital Stay: Payer: Medicare Other | Attending: Internal Medicine

## 2021-02-02 VITALS — BP 90/69 | HR 75 | Temp 97.0°F | Resp 18 | Wt 207.7 lb

## 2021-02-02 DIAGNOSIS — C3411 Malignant neoplasm of upper lobe, right bronchus or lung: Secondary | ICD-10-CM | POA: Diagnosis not present

## 2021-02-02 DIAGNOSIS — Z79899 Other long term (current) drug therapy: Secondary | ICD-10-CM | POA: Insufficient documentation

## 2021-02-02 DIAGNOSIS — Z5112 Encounter for antineoplastic immunotherapy: Secondary | ICD-10-CM | POA: Diagnosis not present

## 2021-02-02 DIAGNOSIS — Z923 Personal history of irradiation: Secondary | ICD-10-CM | POA: Insufficient documentation

## 2021-02-02 DIAGNOSIS — C3490 Malignant neoplasm of unspecified part of unspecified bronchus or lung: Secondary | ICD-10-CM

## 2021-02-02 DIAGNOSIS — C3491 Malignant neoplasm of unspecified part of right bronchus or lung: Secondary | ICD-10-CM

## 2021-02-02 DIAGNOSIS — Z95828 Presence of other vascular implants and grafts: Secondary | ICD-10-CM

## 2021-02-02 DIAGNOSIS — E039 Hypothyroidism, unspecified: Secondary | ICD-10-CM | POA: Diagnosis not present

## 2021-02-02 DIAGNOSIS — Z7982 Long term (current) use of aspirin: Secondary | ICD-10-CM | POA: Diagnosis not present

## 2021-02-02 LAB — CMP (CANCER CENTER ONLY)
ALT: 57 U/L — ABNORMAL HIGH (ref 0–44)
AST: 50 U/L — ABNORMAL HIGH (ref 15–41)
Albumin: 2.7 g/dL — ABNORMAL LOW (ref 3.5–5.0)
Alkaline Phosphatase: 249 U/L — ABNORMAL HIGH (ref 38–126)
Anion gap: 14 (ref 5–15)
BUN: 7 mg/dL (ref 6–20)
CO2: 28 mmol/L (ref 22–32)
Calcium: 8.5 mg/dL — ABNORMAL LOW (ref 8.9–10.3)
Chloride: 94 mmol/L — ABNORMAL LOW (ref 98–111)
Creatinine: 0.93 mg/dL (ref 0.61–1.24)
GFR, Estimated: >60 mL/min (ref >60)
Glucose, Bld: 88 mg/dL (ref 70–99)
Potassium: 4.5 mmol/L (ref 3.5–5.1)
Sodium: 136 mmol/L (ref 135–145)
Total Bilirubin: 0.4 mg/dL (ref 0.3–1.2)
Total Protein: 6.5 g/dL (ref 6.5–8.1)

## 2021-02-02 LAB — CBC WITH DIFFERENTIAL (CANCER CENTER ONLY)
Abs Immature Granulocytes: 0.25 10*3/uL — ABNORMAL HIGH (ref 0.00–0.07)
Basophils Absolute: 0.1 10*3/uL (ref 0.0–0.1)
Basophils Relative: 1 %
Eosinophils Absolute: 0.3 10*3/uL (ref 0.0–0.5)
Eosinophils Relative: 3 %
HCT: 35.8 % — ABNORMAL LOW (ref 39.0–52.0)
Hemoglobin: 11.1 g/dL — ABNORMAL LOW (ref 13.0–17.0)
Immature Granulocytes: 2 %
Lymphocytes Relative: 6 %
Lymphs Abs: 0.7 10*3/uL (ref 0.7–4.0)
MCH: 26.4 pg (ref 26.0–34.0)
MCHC: 31 g/dL (ref 30.0–36.0)
MCV: 85 fL (ref 80.0–100.0)
Monocytes Absolute: 0.9 10*3/uL (ref 0.1–1.0)
Monocytes Relative: 8 %
Neutro Abs: 8.9 10*3/uL — ABNORMAL HIGH (ref 1.7–7.7)
Neutrophils Relative %: 80 %
Platelet Count: 380 10*3/uL (ref 150–400)
RBC: 4.21 MIL/uL — ABNORMAL LOW (ref 4.22–5.81)
RDW: 15.3 % (ref 11.5–15.5)
WBC Count: 11.1 10*3/uL — ABNORMAL HIGH (ref 4.0–10.5)
nRBC: 0 % (ref 0.0–0.2)

## 2021-02-02 LAB — TSH: TSH: 20.204 u[IU]/mL — ABNORMAL HIGH (ref 0.320–4.118)

## 2021-02-02 MED ORDER — ALTEPLASE 2 MG IJ SOLR
2.0000 mg | Freq: Once | INTRAMUSCULAR | Status: AC
  Administered 2021-02-02: 2 mg
  Filled 2021-02-02: qty 2

## 2021-02-02 MED ORDER — ALTEPLASE 2 MG IJ SOLR
INTRAMUSCULAR | Status: AC
  Filled 2021-02-02: qty 2

## 2021-02-02 MED ORDER — SODIUM CHLORIDE 0.9 % IV SOLN
200.0000 mg | Freq: Once | INTRAVENOUS | Status: AC
  Administered 2021-02-02: 200 mg via INTRAVENOUS
  Filled 2021-02-02: qty 8

## 2021-02-02 MED ORDER — SODIUM CHLORIDE 0.9% FLUSH
10.0000 mL | INTRAVENOUS | Status: DC | PRN
  Filled 2021-02-02: qty 10

## 2021-02-02 MED ORDER — SODIUM CHLORIDE 0.9 % IV SOLN
Freq: Once | INTRAVENOUS | Status: AC
Start: 2021-02-02 — End: 2021-02-02
  Filled 2021-02-02: qty 250

## 2021-02-02 MED ORDER — HEPARIN SOD (PORK) LOCK FLUSH 100 UNIT/ML IV SOLN
500.0000 [IU] | Freq: Once | INTRAVENOUS | Status: DC | PRN
Start: 2021-02-02 — End: 2021-02-24
  Filled 2021-02-02: qty 5

## 2021-02-02 MED ORDER — SODIUM CHLORIDE 0.9% FLUSH
10.0000 mL | Freq: Once | INTRAVENOUS | Status: AC
  Administered 2021-02-02: 10 mL
  Filled 2021-02-02: qty 10

## 2021-02-02 NOTE — Progress Notes (Signed)
[place] Telephone:(336) [phone]   Fax:(336) [phone]  OFFICE PROGRESS NOTE  [place], [place] [address]  DIAGNOSIS:stage IIIA (T3, N2, M0) non-small cell lung cancer, adenosquamous carcinoma diagnosed in June 2019 and presented with large right upper lobe lung mass with questionable chest wall invasion as well as right hilar and mediastinal lymphadenopathy.  Biomarker Findings Tumor Mutational Burden - TMB-High (21 Muts/Mb) Microsatellite status - MS-Stable Genomic Findings For a complete list of the genes assayed, please refer to the Appendix. ATM H0865* CCND2 P281R KRAS G12V CHEK2 T338f*15 TP53 E298* 7 Disease relevant genes with no reportable alterations: ALK, EGFR, BRAF, MET, RET, ERBB2, ROS1  PDL1 Expression: 99%.  PRIOR THERAPY: 1) Concurrent chemoradiation with chemotherapy consisting of weekly carboplatin for an AUC of 2 and paclitaxel 45 mg/m2. First dose given on 04/03/2018.  Status post 6 cycles.  Last dose was giving 05/15/2018 with partial response. 2) Consolidation treatment with immunotherapy with Imfinzi (Durvalumab) 10 mg/KG every 2 weeks.  First dose 06/20/2018.  Status post 26 cycles.  CURRENT THERAPY:Systemic treatment with immunotherapy with Keytruda 200 mg IV every 3 weeks.  First dose February 27, 2020.  Status post 11 cycles.   INTERVAL HISTORY: Mark WILENSKY60y.o. male returns to the clinic today for follow-up visit.  The patient is feeling fine today with no concerning complaints.  He denied having any chest pain, shortness of breath, cough or hemoptysis.  He denied having any fever or chills.  He has mild throbbing in the Port-A-Cath site.  He has no nausea, vomiting, diarrhea or constipation.  He has no headache or visual changes.  He is here today for evaluation before starting cycle #12 of his treatment.   MEDICAL HISTORY: Past Medical History:  Diagnosis Date  . Acute hepatitis C virus  infection 06/19/2007   Qualifier: Diagnosis of  By: MLeilani MerlCMA, Tiffany    . Acute ST elevation myocardial infarction (STEMI) involving left anterior descending (LAD) coronary artery ([facility] 05/26/2016  . Acute ST elevation myocardial infarction (STEMI) of lateral wall ([facility] 05/26/2016  . 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 Anxiety   . Asthma   . Atherosclerosis of native arteries of the extremities with intermittent claudication 12/16/2011  . Chronic hepatitis C without hepatic coma ([facility] 05/03/2014  . COPD with chronic bronchitis and emphysema ([facility] 03/13/2018   FEV1 57%  . Depression   . DVT (deep venous thrombosis) ([facility] 2009; 2019   RLE; LLE  . Encounter for antineoplastic chemotherapy 03/22/2018  . Hepatitis C    "tx'd in 2015"  . High cholesterol   . Ischemic cardiomyopathy 03/02/2018  . Non-small cell carcinoma of right lung, stage 3 (HS[facility] 03/22/2018  . PAD (peripheral artery disease) ([facility] 01/25/2013  . Peripheral vascular disease, unspecified 07/20/2012  . Port-A-Cath in place 04/24/2018  . ST elevation myocardial infarction involving left anterior descending (LAD) coronary artery ([facility]   . STEMI (ST elevation myocardial infarction) ([facility] 05/26/2016  . Tobacco abuse 01/28/2016  . Tubular adenoma of colon 07/11/2014  . Urinary dribbling     ALLERGIES:  is allergic to lyrica [pregabalin] and neurontin [gabapentin].  MEDICATIONS:  Current Outpatient Medications  Medication Sig Dispense Refill  . albuterol (VENTOLIN HFA) 108 (90 Base) MCG/ACT inhaler SMARTSIG:1-2 Puff(s) Via Inhaler Every 4 Hours PRN    . alprazolam (XANAX) 2 MG tablet Take 2 mg by mouth 4 (four) times daily as needed for anxiety.    .[name]  amitriptyline (ELAVIL) 100 MG tablet Take 100 mg by mouth at bedtime.     [name] aspirin 81 MG EC tablet Take 81 mg by mouth daily.    [name] atorvastatin (LIPITOR) 80 MG tablet Take 1 tablet (80 mg total) by mouth daily at 6 PM. 30 tablet 12  . carvedilol (COREG) 6.25 MG  tablet TAKE 1 TABLET (6.25 MG TOTAL) BY MOUTH 2 (TWO) TIMES DAILY WITH A MEAL. OFFICE VISIT NEEDED 180 tablet 2  . citalopram (CELEXA) 40 MG tablet Take 40 mg by mouth daily.  11  . furosemide (LASIX) 20 MG tablet Take 1 tablet (20 mg total) by mouth daily. Please make overdue appt with Dr. Oval Linsey before anymore refills. 2nd attempt 15 tablet 0  . HYDROcodone-acetaminophen (NORCO) 10-325 MG tablet Take 1 tablet by mouth every 4 (four) hours as needed for moderate pain.   0  . levothyroxine (SYNTHROID) 125 MCG tablet Take 125 mcg by mouth daily.    [name] lidocaine-prilocaine (EMLA) cream Apply 1 application topically as needed. 30 g 0  . lisinopril (PRINIVIL,ZESTRIL) 2.5 MG tablet Take 1 tablet (2.5 mg total) by mouth daily. 30 tablet 12  . nitroGLYCERIN (NITROSTAT) 0.4 MG SL tablet Place 1 tablet (0.4 mg total) under the tongue every 5 (five) minutes x 3 doses as needed for chest pain. 25 tablet 2  . prochlorperazine (COMPAZINE) 10 MG tablet Take 1 tablet (10 mg total) by mouth every 6 (six) hours as needed for nausea or vomiting. 30 tablet 0  . spironolactone (ALDACTONE) 25 MG tablet TAKE 1 TABLET BY MOUTH EVERY DAY 90 tablet 0  . sucralfate (CARAFATE) 1 g tablet Take 1 tablet (1 g total) by mouth 4 (four) times daily -  with meals and at bedtime. 5 min before meals for radiation induced esophagitis 120 tablet 2  . tamsulosin (FLOMAX) 0.4 MG CAPS capsule Take 1 capsule by mouth daily.    . TRELEGY ELLIPTA 100-62.5-25 MCG/INH AEPB Inhale 1 puff into the lungs daily.     No current facility-administered medications for this visit.   Facility-Administered Medications Ordered in Other Visits  Medication Dose Route Frequency Provider Last Rate Last Admin  . sodium chloride flush (NS) 0.9 % injection 10 mL  10 mL Intracatheter PRN Curt Bears, MD   10 mL at 03/19/20 1544    SURGICAL HISTORY:  Past Surgical History:  Procedure Laterality Date  . AORTOGRAM  08/08/2009   for LLE claudication      By Dr. Oneida Alar  . 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 BIOPSY N/A 05/30/2014   Procedure: BIOPSY;  Surgeon: [name], MD;  Location: [facility];  Service: Endoscopy;  Laterality: N/A;  . BLADDER TUMOR EXCISION  8/812  . CARDIAC CATHETERIZATION N/A 05/26/2016   Procedure: Left Heart Cath and Coronary Angiography;  Surgeon: [name], MD;  Location: [facility];  Service: Cardiovascular;  Laterality: N/A;  . CARDIAC CATHETERIZATION N/A 05/26/2016   Procedure: Coronary Stent Intervention;  Surgeon: [name], MD;  Location: [facility];  Service: Cardiovascular;  Laterality: N/A;  . COLONOSCOPY WITH PROPOFOL N/A 05/30/2014   CWC:BJSEGB colonic polyp-likely source of hematochezia-removed as described above  . ESOPHAGOGASTRODUODENOSCOPY (EGD) WITH PROPOFOL N/A 05/30/2014   TDV:VOHYWV and bulbar erosions s/p gastric biopsy. No evidence of portal gastropathy on today's examination.  . FEMORAL-TIBIAL BYPASS GRAFT  2009   Right side using non-reversed GSV   By Dr. Oneida Alar  . FEMORAL-TIBIAL BYPASS GRAFT  11/24/2007   Right femoral to anterior tibial BPG   by Dr. Oneida Alar  . FINGER SURGERY Left    "straightened my pinky"  . HAND TENDON SURGERY Left 2013   Left 5th finger  . HERNIA REPAIR     "stomach"  . ICD IMPLANT N/A 03/02/2018   Procedure: ICD IMPLANT;  Surgeon: [name], MD;  Location: [facility];  Service: Cardiovascular;  Laterality: N/A;  . INCISIONAL HERNIA REPAIR N/A 12/25/2014   Procedure: HERNIA REPAIR INCISIONAL WITH MESH;  Surgeon: [name], MD;  Location: [facility];  Service: General;  Laterality: N/A;  . INSERTION OF MESH N/A 12/25/2014   Procedure: INSERTION OF MESH;  Surgeon: [name], MD;  Location: [facility];  Service: General;  Laterality: N/A;  . IR IMAGING GUIDED PORT INSERTION  04/11/2018  . LAPAROSCOPIC CHOLECYSTECTOMY    . LOWER EXTREMITY ANGIOGRAM Left 12/30/2015   Procedure: Lower Extremity Angiogram;  Surgeon: [name], MD;  Location: [facility];  Service: Cardiovascular;  Laterality: Left;  . PERIPHERAL VASCULAR CATHETERIZATION N/A 12/30/2015   Procedure: Abdominal Aortogram;  Surgeon: [name], MD;  Location: [facility];  Service: Cardiovascular;  Laterality: N/A;  . POLYPECTOMY  05/30/2014   Procedure: POLYPECTOMY;  Surgeon: [name], MD;  Location: [facility];  Service: Endoscopy;;  . TONSILLECTOMY AND ADENOIDECTOMY  ~ 1970  . TYMPANOSTOMY TUBE PLACEMENT Bilateral ~ 1970    REVIEW OF SYSTEMS:  A comprehensive review of systems was negative except for: Constitutional: positive for fatigue   PHYSICAL EXAMINATION: General appearance: alert, cooperative, fatigued and no distress Head: Normocephalic, without obvious abnormality, atraumatic Neck: no adenopathy, no JVD, supple, symmetrical, trachea midline and thyroid not enlarged, symmetric, no tenderness/mass/nodules Lymph nodes: Cervical, supraclavicular, and axillary nodes normal. Resp: clear to auscultation bilaterally Back: symmetric, no curvature. ROM normal. No CVA tenderness. Cardio: regular rate and rhythm, S1, S2 normal, no murmur, click, rub or gallop GI: soft, non-tender; bowel sounds normal; no masses,  no organomegaly Extremities: Right below knee amputation otherwise no edema.  ECOG PERFORMANCE STATUS: 1 - Symptomatic but completely ambulatory  Blood pressure 90/69, pulse 75, temperature (!) 97 F (36.1 C), temperature source Tympanic, resp. rate 18, weight 207 lb 11.2 oz (94.2 kg), SpO2 98 %.  LABORATORY DATA: Lab Results  Component Value Date   WBC 11.1 (H) 02/02/2021   HGB 11.1 (L) 02/02/2021   HCT 35.8 (L) 02/02/2021   MCV 85.0 02/02/2021   PLT 380 02/02/2021      Chemistry      Component Value Date/Time   NA 134 (L) 01/12/2021 0955   NA 139 02/27/2018 1457   K 4.4 01/12/2021 0955   CL 94 (L) 01/12/2021 0955   CO2 29 01/12/2021 0955   BUN 9 01/12/2021 0955   BUN 7 02/27/2018 1457    CREATININE 0.91 01/12/2021 0955   CREATININE 1.13 06/28/2016 1453      Component Value Date/Time   CALCIUM 8.6 (L) 01/12/2021 0955   ALKPHOS 167 (H) 01/12/2021 0955   AST 48 (H) 01/12/2021 0955   ALT 47 (H) 01/12/2021 0955   BILITOT 0.3 01/12/2021 0955       RADIOGRAPHIC STUDIES: No results found.  ASSESSMENT AND PLAN: This is a very pleasant 60 years old white male recently diagnosed with a stage IIIa non-small cell lung cancer, adenosquamous carcinoma.  He underwent a course of concurrent chemoradiation with weekly carboplatin and paclitaxel status post 6  cycles with partial response.  He has no actionable mutation but PD-L1 expression is 99%. The patient completed treatment with consolidation immunotherapy with Imfinzi status post 26 cycles.  He tolerated this treatment well with no concerning adverse effects. The patient was on observation for several months but he developed disease progression. He is currently undergoing treatment with systemic immunotherapy with Keytruda 200 mg IV every 3 weeks status post 11 cycles.  He missed a few cycles of his treatment because of traveling to [place] to take care of his parents.  The patient continues to tolerate his treatment fairly well.  I recommended for him to proceed with cycle #12 today as planned. I will see him back for follow-up visit in 3 weeks for evaluation before starting cycle #13 with repeat CT scan of the chest for restaging of his disease. For the hypothyroidism, he is currently on levothyroxine 125 mcg p.o. daily and will continue to monitor his TSH closely and adjust his dose as needed. The patient was advised to call immediately if he has any concerning symptoms in the interval. The patient voices understanding of current disease status and treatment options and is in agreement with the current care plan. All questions were answered. The patient knows to call the clinic with any problems, questions or concerns. We can certainly  see the patient much sooner if necessary.   Disclaimer: This note was dictated with voice recognition software. Similar sounding words can inadvertently be transcribed and may not be corrected upon review.

## 2021-02-02 NOTE — Progress Notes (Signed)
Unable to get blood return from pt's port. Cathflo was given by Stanton Kidney, RN at [phone]. Blood was drawn peripherally.

## 2021-02-02 NOTE — Patient Instructions (Signed)
Steps to Quit Smoking Smoking tobacco is the leading cause of preventable death. It can affect almost every organ in the body. Smoking puts you and people around you at risk for many serious, long-lasting (chronic) diseases. Quitting smoking can be hard, but it is one of the best things that you can do for your health. It is never too late to quit. How do I get ready to quit? When you decide to quit smoking, make a plan to help you succeed. Before you quit:  Pick a date to quit. Set a date within the next 2 weeks to give you time to prepare.  Write down the reasons why you are quitting. Keep this list in places where you will see it often.  Tell your family, friends, and co-workers that you are quitting. Their support is important.  Talk with your doctor about the choices that may help you quit.  Find out if your health insurance will pay for these treatments.  Know the people, places, things, and activities that make you want to smoke (triggers). Avoid them. What first steps can I take to quit smoking?  Throw away all cigarettes at home, at work, and in your car.  Throw away the things that you use when you smoke, such as ashtrays and lighters.  Clean your car. Make sure to empty the ashtray.  Clean your home, including curtains and carpets. What can I do to help me quit smoking? Talk with your doctor about taking medicines and seeing a counselor at the same time. You are more likely to succeed when you do both.  If you are pregnant or breastfeeding, talk with your doctor about counseling or other ways to quit smoking. Do not take medicine to help you quit smoking unless your doctor tells you to do so. To quit smoking: Quit right away  Quit smoking totally, instead of slowly cutting back on how much you smoke over a period of time.  Go to counseling. You are more likely to quit if you go to counseling sessions regularly. Take medicine You may take medicines to help you quit. Some  medicines need a prescription, and some you can buy over-the-counter. Some medicines may contain a drug called nicotine to replace the nicotine in cigarettes. Medicines may:  Help you to stop having the desire to smoke (cravings).  Help to stop the problems that come when you stop smoking (withdrawal symptoms). Your doctor may ask you to use:  Nicotine patches, gum, or lozenges.  Nicotine inhalers or sprays.  Non-nicotine medicine that is taken by mouth. Find resources Find resources and other ways to help you quit smoking and remain smoke-free after you quit. These resources are most helpful when you use them often. They include:  Online chats with a counselor.  Phone quitlines.  Printed self-help materials.  Support groups or group counseling.  Text messaging programs.  Mobile phone apps. Use apps on your mobile phone or tablet that can help you stick to your quit plan. There are many free apps for mobile phones and tablets as well as websites. Examples include Quit Guide from the CDC and smokefree.gov   What things can I do to make it easier to quit?  Talk to your family and friends. Ask them to support and encourage you.  Call a phone quitline (1-800-QUIT-NOW), reach out to support groups, or work with a counselor.  Ask people who smoke to not smoke around you.  Avoid places that make you want to smoke,   such as: ? Bars. ? Parties. ? Smoke-break areas at work.  Spend time with people who do not smoke.  Lower the stress in your life. Stress can make you want to smoke. Try these things to help your stress: ? Getting regular exercise. ? Doing deep-breathing exercises. ? Doing yoga. ? Meditating. ? Doing a body scan. To do this, close your eyes, focus on one area of your body at a time from head to toe. Notice which parts of your body are tense. Try to relax the muscles in those areas.   How will I feel when I quit smoking? Day 1 to 3 weeks Within the first 24 hours,  you may start to have some problems that come from quitting tobacco. These problems are very bad 2-3 days after you quit, but they do not often last for more than 2-3 weeks. You may get these symptoms:  Mood swings.  Feeling restless, nervous, angry, or annoyed.  Trouble concentrating.  Dizziness.  Strong desire for high-sugar foods and nicotine.  Weight gain.  Trouble pooping (constipation).  Feeling like you may vomit (nausea).  Coughing or a sore throat.  Changes in how the medicines that you take for other issues work in your body.  Depression.  Trouble sleeping (insomnia). Week 3 and afterward After the first 2-3 weeks of quitting, you may start to notice more positive results, such as:  Better sense of smell and taste.  Less coughing and sore throat.  Slower heart rate.  Lower blood pressure.  Clearer skin.  Better breathing.  Fewer sick days. Quitting smoking can be hard. Do not give up if you fail the first time. Some people need to try a few times before they succeed. Do your best to stick to your quit plan, and talk with your doctor if you have any questions or concerns. Summary  Smoking tobacco is the leading cause of preventable death. Quitting smoking can be hard, but it is one of the best things that you can do for your health.  When you decide to quit smoking, make a plan to help you succeed.  Quit smoking right away, not slowly over a period of time.  When you start quitting, seek help from your doctor, family, or friends. This information is not intended to replace advice given to you by your health care provider. Make sure you discuss any questions you have with your health care provider. Document Revised: 06/08/2019 Document Reviewed: 12/02/2018 Elsevier Patient Education  2021 [employer].  

## 2021-02-02 NOTE — Patient Instructions (Signed)
[facility] ONCOLOGY  Discharge Instructions: Thank you for choosing [facility] to provide your oncology and hematology care.   If you have a lab appointment with the [facility], please go directly to the [facility] and check in at the registration area.   Wear comfortable clothing and clothing appropriate for easy access to any Portacath or PICC line.   We strive to give you quality time with your provider. You may need to reschedule your appointment if you arrive late (15 or more minutes).  Arriving late affects you and other patients whose appointments are after yours.  Also, if you miss three or more appointments without notifying the office, you may be dismissed from the clinic at the provider's discretion.      For prescription refill requests, have your pharmacy contact our office and allow 72 hours for refills to be completed.    Today you received the following chemotherapy and/or immunotherapy agents: pembrolizumab.      To help prevent nausea and vomiting after your treatment, we encourage you to take your nausea medication as directed.  BELOW ARE SYMPTOMS THAT SHOULD BE REPORTED IMMEDIATELY: . *FEVER GREATER THAN 100.4 F (38 C) OR HIGHER . *CHILLS OR SWEATING . *NAUSEA AND VOMITING THAT IS NOT CONTROLLED WITH YOUR NAUSEA MEDICATION . *UNUSUAL SHORTNESS OF BREATH . *UNUSUAL BRUISING OR BLEEDING . *URINARY PROBLEMS (pain or burning when urinating, or frequent urination) . *BOWEL PROBLEMS (unusual diarrhea, constipation, pain near the anus) . TENDERNESS IN MOUTH AND THROAT WITH OR WITHOUT PRESENCE OF ULCERS (sore throat, sores in mouth, or a toothache) . UNUSUAL RASH, SWELLING OR PAIN  . UNUSUAL VAGINAL DISCHARGE OR ITCHING   Items with * indicate a potential emergency and should be followed up as soon as possible or go to the Emergency Department if any problems should occur.  Please show the CHEMOTHERAPY ALERT CARD or IMMUNOTHERAPY  ALERT CARD at check-in to the Emergency Department and triage nurse.  Should you have questions after your visit or need to cancel or reschedule your appointment, please contact [facility]  Dept: [phone]  and follow the prompts.  Office hours are 8:00 a.m. to 4:30 p.m. Monday - Friday. Please note that voicemails left after 4:00 p.m. may not be returned until the following business day.  We are closed weekends and major holidays. You have access to a nurse at all times for urgent questions. Please call the main number to the clinic Dept: [phone] and follow the prompts.   For any non-urgent questions, you may also contact your provider using MyChart. We now offer e-Visits for anyone 41 and older to request care online for non-urgent symptoms. For details visit [email or web address].   Also download the MyChart app! Go to the app store, search "MyChart", open the app, select [facility], and log in with your MyChart username and password.  Due to Covid, a mask is required upon entering the hospital/clinic. If you do not have a mask, one will be given to you upon arrival. For doctor visits, patients may have 1 support person aged 71 or older with them. For treatment visits, patients cannot have anyone with them due to current Covid guidelines and our immunocompromised population.

## 2021-02-02 NOTE — Patient Instructions (Signed)
Implanted Port Insertion, Care After This sheet gives you information about how to care for yourself after your procedure. Your health care provider may also give you more specific instructions. If you have problems or questions, contact your health care provider. What can I expect after the procedure? After the procedure, it is common to have:  Discomfort at the port insertion site.  Bruising on the skin over the port. This should improve over 3-4 days. Follow these instructions at home: Port care  After your port is placed, you will get a manufacturer's information card. The card has information about your port. Keep this card with you at all times.  Take care of the port as told by your health care provider. Ask your health care provider if you or a family member can get training for taking care of the port at home. A home health care nurse may also take care of the port.  Make sure to remember what type of port you have. Incision care  Follow instructions from your health care provider about how to take care of your port insertion site. Make sure you: ? Wash your hands with soap and water before and after you change your bandage (dressing). If soap and water are not available, use hand sanitizer. ? Change your dressing as told by your health care provider. ? Leave stitches (sutures), skin glue, or adhesive strips in place. These skin closures may need to stay in place for 2 weeks or longer. If adhesive strip edges start to loosen and curl up, you may trim the loose edges. Do not remove adhesive strips completely unless your health care provider tells you to do that.  Check your port insertion site every day for signs of infection. Check for: ? Redness, swelling, or pain. ? Fluid or blood. ? Warmth. ? Pus or a bad smell.      Activity  Return to your normal activities as told by your health care provider. Ask your health care provider what activities are safe for you.  Do not  lift anything that is heavier than 10 lb (4.5 kg), or the limit that you are told, until your health care provider says that it is safe. General instructions  Take over-the-counter and prescription medicines only as told by your health care provider.  Do not take baths, swim, or use a hot tub until your health care provider approves. Ask your health care provider if you may take showers. You may only be allowed to take sponge baths.  Do not drive for 24 hours if you were given a sedative during your procedure.  Wear a medical alert bracelet in case of an emergency. This will tell any health care providers that you have a port.  Keep all follow-up visits as told by your health care provider. This is important. Contact a health care provider if:  You cannot flush your port with saline as directed, or you cannot draw blood from the port.  You have a fever or chills.  You have redness, swelling, or pain around your port insertion site.  You have fluid or blood coming from your port insertion site.  Your port insertion site feels warm to the touch.  You have pus or a bad smell coming from the port insertion site. Get help right away if:  You have chest pain or shortness of breath.  You have bleeding from your port that you cannot control. Summary  Take care of the port as told by your   health care provider. Keep the manufacturer's information card with you at all times.  Change your dressing as told by your health care provider.  Contact a health care provider if you have a fever or chills or if you have redness, swelling, or pain around your port insertion site.  Keep all follow-up visits as told by your health care provider. This information is not intended to replace advice given to you by your health care provider. Make sure you discuss any questions you have with your health care provider. Document Revised: 04/11/2018 Document Reviewed: 04/11/2018 Elsevier Patient Education   2021 [employer] Inc.  

## 2021-02-03 ENCOUNTER — Other Ambulatory Visit: Payer: Medicare Other

## 2021-02-03 ENCOUNTER — Ambulatory Visit: Payer: Medicare Other

## 2021-02-03 ENCOUNTER — Ambulatory Visit: Payer: Medicare Other | Admitting: Internal Medicine

## 2021-02-18 ENCOUNTER — Other Ambulatory Visit: Payer: Self-pay | Admitting: Cardiovascular Disease

## 2021-02-18 ENCOUNTER — Telehealth: Payer: Self-pay | Admitting: Physician Assistant

## 2021-02-18 NOTE — Telephone Encounter (Signed)
I called and spoke to the patient's wife today.  The patient is scheduled for an office visit with me on 02/24/21.  The patient is supposed to have a restaging CT scan of the chest before that visit and it appears that this has not been scheduled at this time.  I spoke to the patient's wife and gave her instructions on how to call the radiology scheduling department to schedule her scan.  I instructed her to call over and see if he can get his scan before his office visit on 02/24/2021.

## 2021-02-18 NOTE — Progress Notes (Signed)
[name] OFFICE PROGRESS NOTE  [place], MD [address] [place] [address]  DIAGNOSIS:  Recurrent lung cancer initially diagnosed as stage IIIA (T3, N2, M0) non-small cell lung cancer, adenosquamous carcinoma diagnosed in June 2019 and presented with large right upper lobe lung mass with questionable chest wall invasion as well as right hilar and mediastinal lymphadenopathy.  Biomarker Findings Tumor Mutational Burden - TMB-High (21 Muts/Mb) Microsatellite status - MS-Stable Genomic Findings For a complete list of the genes assayed, please refer to the Appendix. ATM Y1856* CCND2 P281R KRAS G12V CHEK2 T332f*15 TP53 E298* 7 Disease relevant genes with no reportable alterations: ALK, EGFR, BRAF, MET, RET, ERBB2, ROS1  PDL1 Expression: 99%  PRIOR THERAPY: 1) Concurrent chemoradiation with chemotherapy consisting of weekly carboplatin for an AUC of 2 and paclitaxel 45 mg/m2. First dose given on 04/03/2018.Status post 6 cycles. Last dose was giving 05/15/2018 with partial response. 2) Consolidation treatment with immunotherapy with Imfinzi (Durvalumab) 10 mg/KG every 2 weeks. First dose 06/20/2018. Status post 26 cycles. 3) Systemic treatment with immunotherapy with Keytruda 200 mg IV every 3 weeks. First dose February 27, 2020.Status post12cycles. Discontinued due to disease progression.  CURRENT THERAPY: Carboplatin for an AUC of 5 and Alimta 500 mg per metered squared on days 1 IV every 3 weeks.  First dose expected on 03/03/2021  INTERVAL HISTORY: [name] 60y.o. male returns to the clinic for a follow up visit accompanied by his wife. The patient is feeling well today without any concerning complaints. The patient had been lost to follow up since 08/11/20. He/his wife kept calling the clinic to cancel and reschedule appointments.He [place] His mother was also in a nursing home. The history  is limited due to the patient being a poor historian.He then came to the clinic on 11/10/20 and received one treatment before missing his last appointment in early March 2022.   The patient had a restaging CT scan of the chest in March which showed disease progression due to the patient being off treatment for several months.  He resumed treatment at that time every 3 weeks.  He tolerates treatment well without any concerning adverse side effects.  He was supposed to have a restaging CT scan before his visit today and I personally spoke to the patient's wife last week give her instructions on how to schedule his CT scan which was performed on 02/20/21.  Overall, the patient denies any fever, chills, or night sweats. He lost weight since his last appointment. He was supposed to see a member of the nutritionist team while in infusion today.  He denies any chest pain or hemoptysis.  He reports his baseline shortness of breath with exertion and cough. He continues to smoke about 1/2 a pack of cigarettes. He did have some nausea/vomiting a few days ago.  He uses a stool softener for constipation.  He denies any headache or visual changes. He reportedly falls a lot, with and without his prosthetic leg. His last restaging brain MRI was in 12/17/20 which did not show metastatic disease to the brain. The patient recently had a restaging CT scan performed. The patient is here today for evaluation and to review his scan results before starting cycle #12.  MEDICAL HISTORY: Past Medical History:  Diagnosis Date  . Acute hepatitis C virus infection 06/19/2007   Qualifier: Diagnosis of  By: MLeilani MerlCMA, Tiffany    . Acute ST  elevation myocardial infarction (STEMI) involving left anterior descending (LAD) coronary artery ([place]) 05/26/2016  . Acute ST elevation myocardial infarction (STEMI) of lateral wall ([place]) 05/26/2016  . 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 Anxiety   . Asthma   .  Atherosclerosis of native arteries of the extremities with intermittent claudication 12/16/2011  . Chronic hepatitis C without hepatic coma ([place]) 05/03/2014  . COPD with chronic bronchitis and emphysema ([place]) 03/13/2018   FEV1 57%  . Depression   . DVT (deep venous thrombosis) ([place]) 2009; 2019   RLE; LLE  . Encounter for antineoplastic chemotherapy 03/22/2018  . Hepatitis C    "tx'd in 2015"  . High cholesterol   . Ischemic cardiomyopathy 03/02/2018  . Non-small cell carcinoma of right lung, stage 3 ([place]) 03/22/2018  . PAD (peripheral artery disease) ([place]) 01/25/2013  . Peripheral vascular disease, unspecified 07/20/2012  . Port-A-Cath in place 04/24/2018  . ST elevation myocardial infarction involving left anterior descending (LAD) coronary artery ([place])   . STEMI (ST elevation myocardial infarction) ([facility]) 05/26/2016  . Tobacco abuse 01/28/2016  . Tubular adenoma of colon 07/11/2014  . Urinary dribbling     ALLERGIES:  is allergic to lyrica [pregabalin] and neurontin [gabapentin].  MEDICATIONS:  Current Outpatient Medications  Medication Sig Dispense Refill  . albuterol (VENTOLIN HFA) 108 (90 Base) MCG/ACT inhaler SMARTSIG:1-2 Puff(s) Via Inhaler Every 4 Hours PRN    . alprazolam (XANAX) 2 MG tablet Take 2 mg by mouth 4 (four) times daily as needed for anxiety.    [name] amitriptyline (ELAVIL) 100 MG tablet Take 100 mg by mouth at bedtime.     [name] aspirin 81 MG EC tablet Take 81 mg by mouth daily.    [name] atorvastatin (LIPITOR) 80 MG tablet Take 1 tablet (80 mg total) by mouth daily at 6 PM. 30 tablet 12  . carvedilol (COREG) 6.25 MG tablet TAKE 1 TABLET BY MOUTH TWICE A DAY WITH A MEAL NEEDS APPT FOR REFILLS 180 tablet 2  . citalopram (CELEXA) 40 MG tablet Take 40 mg by mouth daily.  11  . dexamethasone (DECADRON) 4 MG tablet Please take 1 tablet twice a day the day before, the day of, and the day after treatment 40 tablet 2  . folic acid (FOLVITE) 1 MG tablet Take 1 tablet (1 mg total) by mouth daily.  30 tablet 2  . furosemide (LASIX) 20 MG tablet Take 1 tablet (20 mg total) by mouth daily. Please make overdue appt with Dr. Oval Linsey before anymore refills. 2nd attempt 15 tablet 0  . HYDROcodone-acetaminophen (NORCO) 10-325 MG tablet Take 1 tablet by mouth every 4 (four) hours as needed for moderate pain.   0  . levothyroxine (SYNTHROID) 125 MCG tablet Take 125 mcg by mouth daily.    [name] lidocaine-prilocaine (EMLA) cream Apply 1 application topically as needed. 30 g 0  . lisinopril (PRINIVIL,ZESTRIL) 2.5 MG tablet Take 1 tablet (2.5 mg total) by mouth daily. 30 tablet 12  . nitroGLYCERIN (NITROSTAT) 0.4 MG SL tablet Place 1 tablet (0.4 mg total) under the tongue every 5 (five) minutes x 3 doses as needed for chest pain. 25 tablet 2  . prochlorperazine (COMPAZINE) 10 MG tablet Take 1 tablet (10 mg total) by mouth every 6 (six) hours as needed for nausea or vomiting. 30 tablet 2  . spironolactone (ALDACTONE) 25 MG tablet TAKE 1 TABLET BY MOUTH EVERY DAY 90 tablet 0  . sucralfate (CARAFATE) 1 g tablet Take 1 tablet (1 g total) by  mouth 4 (four) times daily -  with meals and at bedtime. 5 min before meals for radiation induced esophagitis 120 tablet 2  . tamsulosin (FLOMAX) 0.4 MG CAPS capsule Take 1 capsule by mouth daily.    . TRELEGY ELLIPTA 100-62.5-25 MCG/INH AEPB Inhale 1 puff into the lungs daily.     Current Facility-Administered Medications  Medication Dose Route Frequency Provider Last Rate Last Admin  . cyanocobalamin ((VITAMIN B-12)) injection 1,000 mcg  1,000 mcg Intramuscular Once Kenniya Westrich L, PA-C       Facility-Administered Medications Ordered in Other Visits  Medication Dose Route Frequency Provider Last Rate Last Admin  . heparin lock flush 100 unit/mL  500 Units Intracatheter Once PRN Curt Bears, MD      . sodium chloride flush (NS) 0.9 % injection 10 mL  10 mL Intracatheter PRN Curt Bears, MD   10 mL at 03/19/20 1544  . sodium chloride flush (NS) 0.9 %  injection 10 mL  10 mL Intracatheter PRN Curt Bears, MD        SURGICAL HISTORY:  Past Surgical History:  Procedure Laterality Date  . AORTOGRAM  08/08/2009   for LLE claudication     By Dr. Oneida Alar  . 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 BIOPSY N/A 05/30/2014   Procedure: BIOPSY;  Surgeon: [name], MD;  Location: [facility];  Service: Endoscopy;  Laterality: N/A;  . BLADDER TUMOR EXCISION  8/812  . CARDIAC CATHETERIZATION N/A 05/26/2016   Procedure: Left Heart Cath and Coronary Angiography;  Surgeon: [name], MD;  Location: [facility];  Service: Cardiovascular;  Laterality: N/A;  . CARDIAC CATHETERIZATION N/A 05/26/2016   Procedure: Coronary Stent Intervention;  Surgeon: [name], MD;  Location: [facility];  Service: Cardiovascular;  Laterality: N/A;  . COLONOSCOPY WITH PROPOFOL N/A 05/30/2014   FTD:DUKGUR colonic polyp-likely source of hematochezia-removed as described above  . ESOPHAGOGASTRODUODENOSCOPY (EGD) WITH PROPOFOL N/A 05/30/2014   KY[facility]:CWCBJS and bulbar erosions s/p gastric biopsy. No evidence of portal gastropathy on today's examination.  . FEMORAL-TIBIAL BYPASS GRAFT  2009   Right side using non-reversed GSV   By Dr. Oneida Alar  . FEMORAL-TIBIAL BYPASS GRAFT  11/24/2007   Right femoral to anterior tibial BPG   by Dr. Oneida Alar  . FINGER SURGERY Left    "straightened my pinky"  . HAND TENDON SURGERY Left 2013   Left 5th finger  . HERNIA REPAIR     "stomach"  . ICD IMPLANT N/A 03/02/2018   Procedure: ICD IMPLANT;  Surgeon: [name], MD;  Location: [facility];  Service: Cardiovascular;  Laterality: N/A;  . INCISIONAL HERNIA REPAIR N/A 12/25/2014   Procedure: HERNIA REPAIR INCISIONAL WITH MESH;  Surgeon: [name], MD;  Location: [facility];  Service: General;  Laterality: N/A;  . INSERTION OF MESH N/A 12/25/2014   Procedure: INSERTION OF MESH;  Surgeon: [name], MD;  Location: [facility];  Service: General;  Laterality:  N/A;  . IR IMAGING GUIDED PORT INSERTION  04/11/2018  . LAPAROSCOPIC CHOLECYSTECTOMY    . LOWER EXTREMITY ANGIOGRAM Left 12/30/2015   Procedure: Lower Extremity Angiogram;  Surgeon: [name], MD;  Location: [facility];  Service: Cardiovascular;  Laterality: Left;  . PERIPHERAL VASCULAR CATHETERIZATION N/A 12/30/2015   Procedure: Abdominal Aortogram;  Surgeon: [name], MD;  Location: [facility];  Service: Cardiovascular;  Laterality: N/A;  . POLYPECTOMY  05/30/2014   Procedure:  POLYPECTOMY;  Surgeon: [name], MD;  Location: [facility];  Service: Endoscopy;;  . TONSILLECTOMY AND ADENOIDECTOMY  ~ 1970  . TYMPANOSTOMY TUBE PLACEMENT Bilateral ~ 1970    REVIEW OF SYSTEMS:   Review of Systems  Constitutional: Negative for appetite change, chills, fatigue, fever and unexpected weight change.  HENT:   Negative for mouth sores, nosebleeds, sore throat and trouble swallowing.   Eyes: Negative for eye problems and icterus.  Respiratory: Positive for baseline cough and shortness of breath with exertion. Negative for hemoptysis.   Cardiovascular: Negative for chest pain and leg swelling.  Gastrointestinal: Positive for nausea/vomiting a few days ago. Negative for abdominal pain, constipation, ordiarrhea. Genitourinary: Negative for bladder incontinence, difficulty urinating, dysuria, frequency and hematuria.   Musculoskeletal: Negative for back pain, gait problem, neck pain and neck stiffness.  Skin: Negative for itching and rash.  Neurological: Positive for falls. Negative for dizziness, extremity weakness,  headaches, light-headedness and seizures.  Hematological: Negative for adenopathy. Does not bruise/bleed easily.  Psychiatric/Behavioral: Negative for confusion, depression and sleep disturbance. The patient is not nervous/anxious.     PHYSICAL EXAMINATION:  Blood pressure (!) 89/65, pulse 78, temperature 97.9 F (36.6 C), temperature source Tympanic, resp. rate  18, height 6' (1.829 m), weight 196 lb 1.6 oz (89 kg), SpO2 100 %.  ECOG PERFORMANCE STATUS: 2 - Symptomatic, <50% confined to bed  Physical Exam  Constitutional: Oriented to person, place, and time and well-developed, well-nourished, and in no distress.  HENT:  Head: Normocephalic and atraumatic.  Mouth/Throat: Oropharynx is clear and moist. No oropharyngeal exudate.  Eyes: Conjunctivae are normal. Right eye exhibits no discharge. Left eye exhibits no discharge. No scleral icterus.  Neck: Normal range of motion. Neck supple.  Cardiovascular: Normal rate, regular rhythm, normal heart sounds and intact distal pulses.  Pulmonary/Chest:Effort normal. Some expiratory wheezing on exam bilaterally.No respiratory distress. No rales.  Abdominal: Soft. Bowel sounds are normal. Exhibits no distension and no mass. There is no tenderness.  Musculoskeletal: BKA noted on the right leg.Normal range of motion. Exhibits no edema.  Lymphadenopathy:  No cervical adenopathy.  Neurological: Alert and oriented to person, place, and time. Exhibits normal muscle tone. Examined in the wheelchair. BKA on right leg. Skin:Skin is warm and dry. No rash noted. Not diaphoretic. No erythema. No pallor.  Psychiatric: Mood, memory and judgment normal.  Vitals reviewed.  LABORATORY DATA: Lab Results  Component Value Date   WBC 12.6 (H) 02/24/2021   HGB 10.7 (L) 02/24/2021   HCT 33.4 (L) 02/24/2021   MCV 83.1 02/24/2021   PLT 392 02/24/2021      Chemistry      Component Value Date/Time   NA 134 (L) 02/24/2021 1051   NA 139 02/27/2018 1457   K 4.1 02/24/2021 1051   CL 94 (L) 02/24/2021 1051   CO2 30 02/24/2021 1051   BUN 10 02/24/2021 1051   BUN 7 02/27/2018 1457   CREATININE 0.84 02/24/2021 1051   CREATININE 1.13 06/28/2016 1453      Component Value Date/Time   CALCIUM 9.1 02/24/2021 1051   ALKPHOS 227 (H) 02/24/2021 1051   AST 37 02/24/2021 1051   ALT 49 (H) 02/24/2021 1051   BILITOT 0.5  02/24/2021 1051       RADIOGRAPHIC STUDIES:  CT Chest W Contrast  Result Date: 02/22/2021 CLINICAL DATA:  Restaging non-small cell lung cancer. EXAM: CT CHEST WITH CONTRAST TECHNIQUE: Multidetector CT imaging of the chest was performed during intravenous contrast administration. CONTRAST:  21m OMNIPAQUE IOHEXOL 300 MG/ML  SOLN COMPARISON:  Numerous prior CT scans. The most recent is 12/12/2020. PET-CT 02/08/2020 FINDINGS: Cardiovascular: The heart is normal in size. Small pericardial effusion. Stable pacer wires. Mild tortuosity and calcification of the thoracic aorta and age advanced coronary artery calcifications. Mediastinum/Nodes: Stable scattered mediastinal and right hilar nodes. No new or progressive findings. The esophagus is grossly normal. Lungs/Pleura: Extensive post treatment changes involving the right lung apex with dense consolidation and air bronchograms. The necrotic appearing cavitary ?mass? located more anteriorly and inferiorly in the right upper lobe has enlarged since the prior study. It measures approximately 6.5 x 5.0 x 4.5 cm and previously measured 3.7 x 3.4 x 2.0 cm. Stable underlying emphysematous changes. No worrisome pulmonary nodules to suggest pulmonary metastatic disease. Stable calcified granulomas and a few scattered sub 4 mm subpleural nodules which are likely lymph nodes. No pleural effusions or pleural nodules. Upper Abdomen: No significant upper abdominal findings. Stable vascular calcifications and mild splenomegaly. Gallbladder is surgically absent. Small scattered upper abdominal lymph nodes are stable. No hepatic or adrenal gland lesions. Musculoskeletal: No significant bony findings. No lytic or sclerotic bone lesions are identified. Stable areas of periosteal reaction involving the third and fourth posterior ribs along the anterior cortices. This could be radiation related. IMPRESSION: 1. Extensive post treatment changes involving the right lung apex. 2.  Enlarging necrotic appearing cavitary "mass" in the right upper lobe. PET-CT may be helpful for further evaluation of what is tumor and what is post treatment scarring changes. 3. Stable scattered mediastinal and right hilar nodes. 4. No findings for pulmonary metastatic disease. 5. Stable emphysematous changes. 6. Stable age advanced coronary artery calcifications. 7. Stable small pericardial effusion. 8. Stable areas of periosteal reaction involving the third and fourth posterior ribs. This could be radiation change. No destructive bony changes. 9. Emphysema and aortic atherosclerosis. Aortic Atherosclerosis (ICD10-I70.0) and Emphysema (ICD10-J43.9). Electronically Signed   By: PMarijo SanesM.D.   On: 02/22/2021 15:18     ASSESSMENT/PLAN:  This is a very pleasant 60year old Caucasian male with stage IIIa non-small cell lung cancer, adenosquamous carcinoma. He presented with a large right upper lobe lung mass with questionable chest wall invasion as well as a right hilar and mediastinal lymphadenopathy. He was diagnosed in June 2019.His PDL1 expression is 99%  He completed 6 cycles of concurrent chemoradiation with carboplatin and paclitaxel. He had a partial response to treatment.  The patient underwent consolidation immunotherapy with Imfinzi 10 mg/kg IV every 2 weeks. He is status post26cycles. He completed this on10/03/2019  He was on observation until he showed evidence of local recurrence.   He is currently undergoing systemic chemotherapy with Keytruda 200 mg IV every 3 weeks. He is status post 9cycles.He tolerated this well without any concerning complains. The patient has missed several appointments since November 2021. He only returned to the clinic once for treatment in that interval. Therefore, on his follow up imaging studies in March 2022, he had evidence of disease progession.  He restarted treatment at that time and continues to tolerate it well.  The patient recently  had a restaging CT scan performed.  Dr. MJulien Nordmannpersonally and independently reviewed the scan discussed the results with the patient today.  The scan showed enlarging necrotic cavitary mass in the right upper lobe which is concerning for evidence of disease progression.  Dr. MJulien Nordmannhad a lengthly discussion with the patient today about his current condition and treatment options.  Dr. MJulien Nordmannrecommended treatment  with systemic chemotherapy with carboplatin for an AUC of 5 and Alimta 500 mg/m IV every 3 weeks.  The patient is interested in proceeding with systemic chemotherapy.  She is expected to start his first dose of this treatment on 03/03/21.  We discussed the adverse side effects of treatment including but not limited to alopecia, myelosuppression, nausea and vomiting, peripheral neuropathy, liver or renal dysfunction as well as immunotherapy mediated adverse effects.   We will arrange for the patient to have a B12 injection while in the clinic today.     I sent prescriptions for 1 mg folic acid p.o. daily as well as Compazine 10 mg every 6 hours as needed for nausea.  I sent in a prescription for Decadron 4 mg to his pharmacy.  He was instructed to take 1 tablet twice a day the day before, the day of, the day after chemotherapy.  The patient will follow-up in 4 weeks for evaluation before starting cycle #2 The patient knows that we strongly encourage smoking cessation. He is not interested in quitting at this time  The patient blood pressure is little bit low today.  I recommended that the patient receive 500 cc to 1 L fluid today.  The patient declined additional fluids today. He was strongly encouraged to increase his fluid intake.   The patient was advised to call immediately if he has any concerning symptoms in the interval. The patient voices understanding of current disease status and treatment options and is in agreement with the and care plan. All questions were answered. The  patient knows to call the clinic with any problems, questions or concerns. We can certainly see the patient much sooner if necessary    Orders Placed This Encounter  Procedures  . TSH    Standing Status:   Standing    Number of Occurrences:   4    Standing Expiration Date:   02/24/2022  . CBC with Differential (Cancer Center Only)    Standing Status:   Standing    Number of Occurrences:   12    Standing Expiration Date:   02/24/2022  . CMP ([facility] only)    Standing Status:   Standing    Number of Occurrences:   12    Standing Expiration Date:   02/24/2022      Tobe Sos Catheline Hixon, PA-C 02/24/21  ADDENDUM: Hematology/Oncology Attending: I had a face-to-face encounter with the patient today.  I reviewed his record, scan and recommended his care plan.  This is a very pleasant 60 years old white male with recurrent and metastatic non-small cell lung cancer, adenocarcinoma with no actionable mutations and PD-L1 expression of 99%.  The patient was initially diagnosed as a stage IIIa status post a course of concurrent chemoradiation followed by consolidation treatment with immunotherapy with Imfinzi.  He had evidence for disease local recurrence and the patient was treated with immunotherapy with single agent Keytruda every 3 weeks status post 9 cycles but his treatment has been interrupted for several months between November 2021 until March 2022 because of frequent travel of the patient to [place] to take care of his parents. He resumed his treatment for the last 3 months and has been tolerating it fairly well. He had repeat CT scan of the chest, abdomen pelvis performed recently.  I personally and independently reviewed the scan images and discussed the result and showed the images to the patient and his wife. Unfortunately his scan showed evidence for disease progression with increased  consolidation and enlarging of the cavitary mass in the right upper lobe. I recommended for the  patient to discontinue his current treatment with Keytruda at this point. I discussed with the patient other treatment options including palliative care versus palliative systemic chemotherapy with carboplatin for AUC of 5 and Alimta 500 Mg/M2 every 3 weeks.  The patient is interested in proceeding with chemotherapy and he is expected to start the first cycle of this treatment next week. I discussed with him the adverse effect of this treatment including but not limited to alopecia, myelosuppression, nausea and vomiting, peripheral neuropathy, liver or renal dysfunction. He will come back for follow-up visit in 4 weeks for evaluation with the start of cycle #2. He will receive vitamin B12 injection today and will call his pharmacy with prescription for folic acid and Decadron. The patient was advised to call immediately if he has any concerning symptoms in the interval. The total time spent in the appointment was 40 minutes. Disclaimer: This note was dictated with voice recognition software. Similar sounding words can inadvertently be transcribed and may be missed upon review. Eilleen Kempf, MD 02/24/21

## 2021-02-20 ENCOUNTER — Ambulatory Visit (HOSPITAL_COMMUNITY)
Admission: RE | Admit: 2021-02-20 | Discharge: 2021-02-20 | Disposition: A | Discharge status: Home / Self Care | Payer: Medicare Other | Source: Ambulatory Visit | Attending: Internal Medicine | Admitting: Internal Medicine

## 2021-02-20 ENCOUNTER — Other Ambulatory Visit: Payer: Self-pay | Admitting: Physician Assistant

## 2021-02-20 ENCOUNTER — Encounter (HOSPITAL_COMMUNITY): Payer: Self-pay

## 2021-02-20 ENCOUNTER — Other Ambulatory Visit: Payer: Self-pay

## 2021-02-20 DIAGNOSIS — C3491 Malignant neoplasm of unspecified part of right bronchus or lung: Secondary | ICD-10-CM

## 2021-02-20 DIAGNOSIS — I708 Atherosclerosis of other arteries: Secondary | ICD-10-CM | POA: Diagnosis not present

## 2021-02-20 DIAGNOSIS — C3490 Malignant neoplasm of unspecified part of unspecified bronchus or lung: Secondary | ICD-10-CM

## 2021-02-20 DIAGNOSIS — I313 Pericardial effusion (noninflammatory): Secondary | ICD-10-CM | POA: Diagnosis not present

## 2021-02-20 DIAGNOSIS — I2510 Atherosclerotic heart disease of native coronary artery without angina pectoris: Secondary | ICD-10-CM | POA: Diagnosis not present

## 2021-02-20 MED ORDER — HEPARIN SOD (PORK) LOCK FLUSH 100 UNIT/ML IV SOLN
500.0000 [IU] | Freq: Once | INTRAVENOUS | Status: AC
  Administered 2021-02-20: 500 [IU] via INTRAVENOUS

## 2021-02-20 MED ORDER — HEPARIN SOD (PORK) LOCK FLUSH 100 UNIT/ML IV SOLN
INTRAVENOUS | Status: AC
  Filled 2021-02-20: qty 5

## 2021-02-20 MED ORDER — IOHEXOL 300 MG/ML  SOLN
75.0000 mL | Freq: Once | INTRAMUSCULAR | Status: AC | PRN
  Administered 2021-02-20: 75 mL via INTRAVENOUS

## 2021-02-20 MED ORDER — SODIUM CHLORIDE (PF) 0.9 % IJ SOLN
INTRAMUSCULAR | Status: AC
  Filled 2021-02-20: qty 50

## 2021-02-24 ENCOUNTER — Inpatient Hospital Stay: Payer: Medicare Other | Admitting: Nutrition

## 2021-02-24 ENCOUNTER — Inpatient Hospital Stay: Payer: Medicare Other

## 2021-02-24 ENCOUNTER — Encounter: Payer: Medicare Other | Admitting: Nutrition

## 2021-02-24 ENCOUNTER — Inpatient Hospital Stay (HOSPITAL_BASED_OUTPATIENT_CLINIC_OR_DEPARTMENT_OTHER): Payer: Medicare Other | Admitting: Physician Assistant

## 2021-02-24 ENCOUNTER — Encounter: Payer: Self-pay | Admitting: Physician Assistant

## 2021-02-24 ENCOUNTER — Other Ambulatory Visit: Payer: Self-pay | Admitting: Internal Medicine

## 2021-02-24 ENCOUNTER — Encounter: Payer: Self-pay | Admitting: Nutrition

## 2021-02-24 ENCOUNTER — Other Ambulatory Visit: Payer: Self-pay

## 2021-02-24 VITALS — BP 89/65 | HR 78 | Temp 97.9°F | Resp 18 | Ht 72.0 in | Wt 196.1 lb

## 2021-02-24 DIAGNOSIS — C3490 Malignant neoplasm of unspecified part of unspecified bronchus or lung: Secondary | ICD-10-CM | POA: Diagnosis not present

## 2021-02-24 DIAGNOSIS — Z7189 Other specified counseling: Secondary | ICD-10-CM | POA: Diagnosis not present

## 2021-02-24 DIAGNOSIS — E7849 Other hyperlipidemia: Secondary | ICD-10-CM | POA: Diagnosis not present

## 2021-02-24 DIAGNOSIS — J449 Chronic obstructive pulmonary disease, unspecified: Secondary | ICD-10-CM | POA: Diagnosis not present

## 2021-02-24 DIAGNOSIS — Z923 Personal history of irradiation: Secondary | ICD-10-CM | POA: Diagnosis not present

## 2021-02-24 DIAGNOSIS — I10 Essential (primary) hypertension: Secondary | ICD-10-CM | POA: Diagnosis not present

## 2021-02-24 DIAGNOSIS — Z79899 Other long term (current) drug therapy: Secondary | ICD-10-CM | POA: Diagnosis not present

## 2021-02-24 DIAGNOSIS — C3491 Malignant neoplasm of unspecified part of right bronchus or lung: Secondary | ICD-10-CM

## 2021-02-24 DIAGNOSIS — C3411 Malignant neoplasm of upper lobe, right bronchus or lung: Secondary | ICD-10-CM | POA: Diagnosis not present

## 2021-02-24 DIAGNOSIS — E039 Hypothyroidism, unspecified: Secondary | ICD-10-CM | POA: Diagnosis not present

## 2021-02-24 DIAGNOSIS — Z5111 Encounter for antineoplastic chemotherapy: Secondary | ICD-10-CM

## 2021-02-24 DIAGNOSIS — Z7982 Long term (current) use of aspirin: Secondary | ICD-10-CM | POA: Diagnosis not present

## 2021-02-24 DIAGNOSIS — Z5112 Encounter for antineoplastic immunotherapy: Secondary | ICD-10-CM | POA: Diagnosis not present

## 2021-02-24 LAB — CMP (CANCER CENTER ONLY)
ALT: 49 U/L — ABNORMAL HIGH (ref 0–44)
AST: 37 U/L (ref 15–41)
Albumin: 2.6 g/dL — ABNORMAL LOW (ref 3.5–5.0)
Alkaline Phosphatase: 227 U/L — ABNORMAL HIGH (ref 38–126)
Anion gap: 10 (ref 5–15)
BUN: 10 mg/dL (ref 6–20)
CO2: 30 mmol/L (ref 22–32)
Calcium: 9.1 mg/dL (ref 8.9–10.3)
Chloride: 94 mmol/L — ABNORMAL LOW (ref 98–111)
Creatinine: 0.84 mg/dL (ref 0.61–1.24)
GFR, Estimated: >60 mL/min (ref >60)
Glucose, Bld: 114 mg/dL — ABNORMAL HIGH (ref 70–99)
Potassium: 4.1 mmol/L (ref 3.5–5.1)
Sodium: 134 mmol/L — ABNORMAL LOW (ref 135–145)
Total Bilirubin: 0.5 mg/dL (ref 0.3–1.2)
Total Protein: 7.1 g/dL (ref 6.5–8.1)

## 2021-02-24 LAB — CBC WITH DIFFERENTIAL (CANCER CENTER ONLY)
Abs Immature Granulocytes: 0.22 10*3/uL — ABNORMAL HIGH (ref 0.00–0.07)
Basophils Absolute: 0.1 10*3/uL (ref 0.0–0.1)
Basophils Relative: 1 %
Eosinophils Absolute: 0.1 10*3/uL (ref 0.0–0.5)
Eosinophils Relative: 1 %
HCT: 33.4 % — ABNORMAL LOW (ref 39.0–52.0)
Hemoglobin: 10.7 g/dL — ABNORMAL LOW (ref 13.0–17.0)
Immature Granulocytes: 2 %
Lymphocytes Relative: 6 %
Lymphs Abs: 0.7 10*3/uL (ref 0.7–4.0)
MCH: 26.6 pg (ref 26.0–34.0)
MCHC: 32 g/dL (ref 30.0–36.0)
MCV: 83.1 fL (ref 80.0–100.0)
Monocytes Absolute: 1 10*3/uL (ref 0.1–1.0)
Monocytes Relative: 8 %
Neutro Abs: 10.6 10*3/uL — ABNORMAL HIGH (ref 1.7–7.7)
Neutrophils Relative %: 82 %
Platelet Count: 392 10*3/uL (ref 150–400)
RBC: 4.02 MIL/uL — ABNORMAL LOW (ref 4.22–5.81)
RDW: 15.7 % — ABNORMAL HIGH (ref 11.5–15.5)
WBC Count: 12.6 10*3/uL — ABNORMAL HIGH (ref 4.0–10.5)
nRBC: 0 % (ref 0.0–0.2)

## 2021-02-24 LAB — TSH: TSH: 7.16 u[IU]/mL — ABNORMAL HIGH (ref 0.320–4.118)

## 2021-02-24 MED ORDER — CYANOCOBALAMIN 1000 MCG/ML IJ SOLN
1000.0000 ug | Freq: Once | INTRAMUSCULAR | Status: AC
  Administered 2021-02-24: 1000 ug via INTRAMUSCULAR

## 2021-02-24 MED ORDER — CYANOCOBALAMIN 1000 MCG/ML IJ SOLN
INTRAMUSCULAR | Status: AC
  Filled 2021-02-24: qty 1

## 2021-02-24 MED ORDER — FOLIC ACID 1 MG PO TABS
1.0000 mg | ORAL_TABLET | Freq: Every day | ORAL | 2 refills | Status: DC
Start: 2021-02-24 — End: 2024-02-27

## 2021-02-24 MED ORDER — DEXAMETHASONE 4 MG PO TABS: ORAL_TABLET | ORAL | 2 refills | Status: DC

## 2021-02-24 MED ORDER — PROCHLORPERAZINE MALEATE 10 MG PO TABS: 10.0000 mg | ORAL_TABLET | Freq: Four times a day (QID) | ORAL | 2 refills | Status: DC | PRN

## 2021-02-24 NOTE — Progress Notes (Signed)
DISCONTINUE ON PATHWAY REGIMEN - Non-Small Cell Lung     A cycle is 21 days:     Pembrolizumab   **Always confirm dose/schedule in your pharmacy ordering system**  REASON: Disease Progression PRIOR TREATMENT: [id number]: Pembrolizumab 200 mg q21 Days Until Disease Progression, Unacceptable Toxicity, or up to 24 Months TREATMENT RESPONSE: Progressive Disease (PD)  START ON PATHWAY REGIMEN - Non-Small Cell Lung     A cycle is every 21 days:     Pemetrexed      Carboplatin   **Always confirm dose/schedule in your pharmacy ordering system**  Patient Characteristics: Stage IV Metastatic, Nonsquamous, Molecular Analysis Completed, Molecular Alteration Present and Targeted Therapy Exhausted OR EGFR Exon 20+ or KRAS G12C+ Present and No Prior Chemo/Immunotherapy OR No Alteration Present, Initial  Chemotherapy/Immunotherapy, PS = 0, 1, No Alteration Present, No Alteration Present, Not a Candidate for Immunotherapy Therapeutic Status: Stage IV Metastatic Histology: Nonsquamous Cell Broad Molecular Profiling Status: Engineer, manufacturing Analysis Results: No Alteration Present ECOG Performance Status: 1 Chemotherapy/Immunotherapy Line of Therapy: Initial Chemotherapy/Immunotherapy EGFR Exons 18-21 Mutation Testing Status: Completed and Negative ALK Fusion/Rearrangement Testing Status: Completed and Negative BRAF V600 Mutation Testing Status: Completed and Negative KRAS G12C Mutation Testing Status: Completed and Negative MET Exon 14 Mutation Testing Status: Completed and Negative RET Fusion/Rearrangement Testing Status: Completed and Negative NTRK Fusion/Rearrangement Testing Status: Completed and Negative ROS1 Fusion/Rearrangement Testing Status: Completed and Negative Immunotherapy Candidate Status: Not a Candidate for Immunotherapy Intent of Therapy: Non-Curative / Palliative Intent, Discussed with Patient

## 2021-02-24 NOTE — Progress Notes (Signed)
Nutrition follow-up was canceled secondary to oncology treatment plan changes.

## 2021-02-24 NOTE — Patient Instructions (Signed)
Summary:  -There are two main categories of lung cancer, they are named based on the size of the cancer cell. One is called Non-Small cell lung cancer. The other type is Small Cell Lung Cancer -The sample (biopsy) that they took of your tumor was consistent with a subtype of Non-small cell lung cancer. This is the most common type of lung cancer.  -We covered a lot of important information at your appointment today regarding what the treatment plan is moving forward. Here are the the main points that were discussed at your office visit with [place] today:  -The treatment that you will receive consists of two chemotherapy drugs, called Carboplatin and Alimta (also called Pemetrexed) -We are planning on starting your treatment next week on 03/03/21 but before your start your treatment. -Your treatment will be given once every 3 weeks. We will check your labs once a week for the first ~5 treatments just to make sure that important components of your blood are in an acceptable range -We will get a CT scan after 3 treatments to check on the progress of treatment  Medications:  -I have sent a few important medication prescriptions to your pharmacy.  -Compazine was sent to your pharmacy. This medication is for nausea. You may take this every 6 hours as needed if you feel nauseous.  -I have also sent a prescription for 1 mg of folic acid to your pharmacy. We need you to take 1 tablet every day.  -We will administer vitamin B12 every 9 weeks while you are here in the clinic. You have received your first dose today.  -He needs to take decadron one tablet twice a day the day before, the day of, and the day after treatment.   Referrals or Imaging:   Follow up:  -We will see you back for a follow up visit 4 weeks after your first treatment to see how it went and help manage any side effects of treatment that you may have   -If you need to reach [place] at any time, the main office number to the cancer center is  [phone], when you call, ask to speak to either Cassie's or Dr. Worthy Flank nurse.

## 2021-02-25 DIAGNOSIS — G894 Chronic pain syndrome: Secondary | ICD-10-CM | POA: Diagnosis not present

## 2021-02-25 DIAGNOSIS — Z89511 Acquired absence of right leg below knee: Secondary | ICD-10-CM | POA: Diagnosis not present

## 2021-02-25 DIAGNOSIS — C3490 Malignant neoplasm of unspecified part of unspecified bronchus or lung: Secondary | ICD-10-CM | POA: Diagnosis not present

## 2021-02-27 ENCOUNTER — Telehealth: Payer: Self-pay

## 2021-02-27 NOTE — Telephone Encounter (Signed)
Nutrition  Called patient for nutrition follow-up.  No answer.  Left message with call back number.   Nutrition team planning to see patient on 6/28 during infusion  Denese Mentink B. Zenia Resides, [place], [place] Registered Dietitian [phone] (mobile)

## 2021-03-02 ENCOUNTER — Telehealth: Payer: Self-pay | Admitting: Internal Medicine

## 2021-03-02 NOTE — Telephone Encounter (Signed)
Scheduled and spoke with patients wife. They did not want to drive to [place], so I cancelled those appts and scheduled here for 6/7. Confirmed date and time.

## 2021-03-03 ENCOUNTER — Other Ambulatory Visit: Payer: Medicare Other

## 2021-03-03 ENCOUNTER — Inpatient Hospital Stay: Payer: Medicare Other | Attending: Physician Assistant

## 2021-03-03 ENCOUNTER — Other Ambulatory Visit: Payer: Self-pay

## 2021-03-03 ENCOUNTER — Inpatient Hospital Stay: Payer: Medicare Other

## 2021-03-03 ENCOUNTER — Ambulatory Visit: Payer: Medicare Other

## 2021-03-03 VITALS — BP 90/71 | HR 68 | Temp 97.8°F | Resp 20 | Wt 195.5 lb

## 2021-03-03 DIAGNOSIS — Z79899 Other long term (current) drug therapy: Secondary | ICD-10-CM | POA: Diagnosis not present

## 2021-03-03 DIAGNOSIS — Z7982 Long term (current) use of aspirin: Secondary | ICD-10-CM | POA: Insufficient documentation

## 2021-03-03 DIAGNOSIS — Z86718 Personal history of other venous thrombosis and embolism: Secondary | ICD-10-CM | POA: Diagnosis not present

## 2021-03-03 DIAGNOSIS — C3491 Malignant neoplasm of unspecified part of right bronchus or lung: Secondary | ICD-10-CM

## 2021-03-03 DIAGNOSIS — Z5111 Encounter for antineoplastic chemotherapy: Secondary | ICD-10-CM | POA: Diagnosis not present

## 2021-03-03 DIAGNOSIS — Z5112 Encounter for antineoplastic immunotherapy: Secondary | ICD-10-CM | POA: Diagnosis present

## 2021-03-03 DIAGNOSIS — Z95828 Presence of other vascular implants and grafts: Secondary | ICD-10-CM

## 2021-03-03 DIAGNOSIS — C3490 Malignant neoplasm of unspecified part of unspecified bronchus or lung: Secondary | ICD-10-CM

## 2021-03-03 DIAGNOSIS — C3411 Malignant neoplasm of upper lobe, right bronchus or lung: Secondary | ICD-10-CM | POA: Diagnosis not present

## 2021-03-03 LAB — CMP (CANCER CENTER ONLY)
ALT: 29 U/L (ref 0–44)
AST: 22 U/L (ref 15–41)
Albumin: 2.5 g/dL — ABNORMAL LOW (ref 3.5–5.0)
Alkaline Phosphatase: 182 U/L — ABNORMAL HIGH (ref 38–126)
Anion gap: 13 (ref 5–15)
BUN: 11 mg/dL (ref 6–20)
CO2: 26 mmol/L (ref 22–32)
Calcium: 9.2 mg/dL (ref 8.9–10.3)
Chloride: 93 mmol/L — ABNORMAL LOW (ref 98–111)
Creatinine: 0.84 mg/dL (ref 0.61–1.24)
GFR, Estimated: >60 mL/min (ref >60)
Glucose, Bld: 231 mg/dL — ABNORMAL HIGH (ref 70–99)
Potassium: 3.6 mmol/L (ref 3.5–5.1)
Sodium: 132 mmol/L — ABNORMAL LOW (ref 135–145)
Total Bilirubin: 0.4 mg/dL (ref 0.3–1.2)
Total Protein: 7.1 g/dL (ref 6.5–8.1)

## 2021-03-03 LAB — CBC WITH DIFFERENTIAL (CANCER CENTER ONLY)
Abs Immature Granulocytes: 0.21 10*3/uL — ABNORMAL HIGH (ref 0.00–0.07)
Basophils Absolute: 0 10*3/uL (ref 0.0–0.1)
Basophils Relative: 0 %
Eosinophils Absolute: 0 10*3/uL (ref 0.0–0.5)
Eosinophils Relative: 0 %
HCT: 34.4 % — ABNORMAL LOW (ref 39.0–52.0)
Hemoglobin: 10.8 g/dL — ABNORMAL LOW (ref 13.0–17.0)
Immature Granulocytes: 2 %
Lymphocytes Relative: 4 %
Lymphs Abs: 0.4 10*3/uL — ABNORMAL LOW (ref 0.7–4.0)
MCH: 25.9 pg — ABNORMAL LOW (ref 26.0–34.0)
MCHC: 31.4 g/dL (ref 30.0–36.0)
MCV: 82.5 fL (ref 80.0–100.0)
Monocytes Absolute: 0.1 10*3/uL (ref 0.1–1.0)
Monocytes Relative: 1 %
Neutro Abs: 9.4 10*3/uL — ABNORMAL HIGH (ref 1.7–7.7)
Neutrophils Relative %: 93 %
Platelet Count: 401 10*3/uL — ABNORMAL HIGH (ref 150–400)
RBC: 4.17 MIL/uL — ABNORMAL LOW (ref 4.22–5.81)
RDW: 15.7 % — ABNORMAL HIGH (ref 11.5–15.5)
WBC Count: 10.2 10*3/uL (ref 4.0–10.5)
nRBC: 0 % (ref 0.0–0.2)

## 2021-03-03 MED ORDER — SODIUM CHLORIDE 0.9 % IV SOLN
Freq: Once | INTRAVENOUS | Status: AC
  Filled 2021-03-03: qty 250

## 2021-03-03 MED ORDER — SODIUM CHLORIDE 0.9 % IV SOLN
500.0000 mg/m2 | Freq: Once | INTRAVENOUS | Status: AC
  Administered 2021-03-03: 1100 mg via INTRAVENOUS
  Filled 2021-03-03: qty 4

## 2021-03-03 MED ORDER — SODIUM CHLORIDE 0.9% FLUSH
10.0000 mL | INTRAVENOUS | Status: DC | PRN
  Administered 2021-03-03: 10 mL
  Filled 2021-03-03: qty 10

## 2021-03-03 MED ORDER — SODIUM CHLORIDE 0.9 % IV SOLN
728.5000 mg | Freq: Once | INTRAVENOUS | Status: AC
  Administered 2021-03-03: 730 mg via INTRAVENOUS
  Filled 2021-03-03: qty 73

## 2021-03-03 MED ORDER — SODIUM CHLORIDE 0.9 % IV SOLN
150.0000 mg | Freq: Once | INTRAVENOUS | Status: AC
  Administered 2021-03-03: 150 mg via INTRAVENOUS
  Filled 2021-03-03: qty 150

## 2021-03-03 MED ORDER — SODIUM CHLORIDE 0.9 % IV SOLN
10.0000 mg | Freq: Once | INTRAVENOUS | Status: AC
  Administered 2021-03-03: 10 mg via INTRAVENOUS
  Filled 2021-03-03: qty 10

## 2021-03-03 MED ORDER — SODIUM CHLORIDE 0.9% FLUSH
10.0000 mL | Freq: Once | INTRAVENOUS | Status: AC
  Administered 2021-03-03: 10 mL
  Filled 2021-03-03: qty 10

## 2021-03-03 MED ORDER — PALONOSETRON HCL INJECTION 0.25 MG/5ML
INTRAVENOUS | Status: AC
  Filled 2021-03-03: qty 5

## 2021-03-03 MED ORDER — PALONOSETRON HCL INJECTION 0.25 MG/5ML
0.2500 mg | Freq: Once | INTRAVENOUS | Status: AC
  Administered 2021-03-03: 0.25 mg via INTRAVENOUS

## 2021-03-03 MED ORDER — HEPARIN SOD (PORK) LOCK FLUSH 100 UNIT/ML IV SOLN
500.0000 [IU] | Freq: Once | INTRAVENOUS | Status: AC | PRN
  Administered 2021-03-03: 500 [IU]
  Filled 2021-03-03: qty 5

## 2021-03-03 NOTE — Progress Notes (Signed)
Pt. blood pressure 76/51, recheck 76/58, denies complaints of chest pain, dizziness, and no shortness of breath noted. Cassie Hellingoeter, PA notified and new orders received. 1 liter normal saline received. Per Dr. Julien Nordmann, ok for treatment today with current vital signs.

## 2021-03-03 NOTE — Patient Instructions (Addendum)
[facility]  Discharge Instructions: Thank you for choosing [facility] to provide your oncology and hematology care.   If you have a lab appointment with the [facility], please go directly to the [facility] and check in at the registration area.   Wear comfortable clothing and clothing appropriate for easy access to any Portacath or PICC line.   We strive to give you quality time with your provider. You may need to reschedule your appointment if you arrive late (15 or more minutes).  Arriving late affects you and other patients whose appointments are after yours.  Also, if you miss three or more appointments without notifying the office, you may be dismissed from the clinic at the provider's discretion.      For prescription refill requests, have your pharmacy contact our office and allow 72 hours for refills to be completed.    Today you received the following chemotherapy and/or immunotherapy agents: Pemetrexed (Alimta) and Carboplatin.   To help prevent nausea and vomiting after your treatment, we encourage you to take your nausea medication as directed.  BELOW ARE SYMPTOMS THAT SHOULD BE REPORTED IMMEDIATELY: . *FEVER GREATER THAN 100.4 F (38 C) OR HIGHER . *CHILLS OR SWEATING . *NAUSEA AND VOMITING THAT IS NOT CONTROLLED WITH YOUR NAUSEA MEDICATION . *UNUSUAL SHORTNESS OF BREATH . *UNUSUAL BRUISING OR BLEEDING . *URINARY PROBLEMS (pain or burning when urinating, or frequent urination) . *BOWEL PROBLEMS (unusual diarrhea, constipation, pain near the anus) . TENDERNESS IN MOUTH AND THROAT WITH OR WITHOUT PRESENCE OF ULCERS (sore throat, sores in mouth, or a toothache) . UNUSUAL RASH, SWELLING OR PAIN  . UNUSUAL VAGINAL DISCHARGE OR ITCHING   Items with * indicate a potential emergency and should be followed up as soon as possible or go to the Emergency Department if any problems should occur.  Please show the CHEMOTHERAPY ALERT CARD  or IMMUNOTHERAPY ALERT CARD at check-in to the Emergency Department and triage nurse.  Should you have questions after your visit or need to cancel or reschedule your appointment, please contact [facility]  Dept: [phone]  and follow the prompts.  Office hours are 8:00 a.m. to 4:30 p.m. Monday - Friday. Please note that voicemails left after 4:00 p.m. may not be returned until the following business day.  We are closed weekends and major holidays. You have access to a nurse at all times for urgent questions. Please call the main number to the clinic Dept: [phone] and follow the prompts.   For any non-urgent questions, you may also contact your provider using MyChart. We now offer e-Visits for anyone 32 and older to request care online for non-urgent symptoms. For details visit [email or web address].   Also download the MyChart app! Go to the app store, search "MyChart", open the app, select [facility], and log in with your MyChart username and password.  Due to Covid, a mask is required upon entering the hospital/clinic. If you do not have a mask, one will be given to you upon arrival. For doctor visits, patients may have 1 support person aged 67 or older with them. For treatment visits, patients cannot have anyone with them due to current Covid guidelines and our immunocompromised population.   Pemetrexed injection What is this medicine? PEMETREXED (PEM e TREX ed) is a chemotherapy drug used to treat lung cancers like non-small cell lung cancer and mesothelioma. It may also be used to treat other cancers. This medicine may  be used for other purposes; ask your health care provider or pharmacist if you have questions. COMMON BRAND NAME(S): Alimta What should I tell my health care provider before I take this medicine? They need to know if you have any of these conditions:  infection (especially a virus infection such as chickenpox, cold sores, or  herpes)  kidney disease  low blood counts, like low white cell, platelet, or red cell counts  lung or breathing disease, like asthma  radiation therapy  an unusual or allergic reaction to pemetrexed, other medicines, foods, dyes, or preservative  pregnant or trying to get pregnant  breast-feeding How should I use this medicine? This drug is given as an infusion into a vein. It is administered in a hospital or clinic by a specially trained health care professional. Talk to your pediatrician regarding the use of this medicine in children. Special care may be needed. Overdosage: If you think you have taken too much of this medicine contact a poison control center or emergency room at once. NOTE: This medicine is only for you. Do not share this medicine with others. What if I miss a dose? It is important not to miss your dose. Call your doctor or health care professional if you are unable to keep an appointment. What may interact with this medicine? This medicine may interact with the following medications:  Ibuprofen This list may not describe all possible interactions. Give your health care provider a list of all the medicines, herbs, non-prescription drugs, or dietary supplements you use. Also tell them if you smoke, drink alcohol, or use illegal drugs. Some items may interact with your medicine. What should I watch for while using this medicine? Visit your doctor for checks on your progress. This drug may make you feel generally unwell. This is not uncommon, as chemotherapy can affect healthy cells as well as cancer cells. Report any side effects. Continue your course of treatment even though you feel ill unless your doctor tells you to stop. In some cases, you may be given additional medicines to help with side effects. Follow all directions for their use. Call your doctor or health care professional for advice if you get a fever, chills or sore throat, or other symptoms of a cold or  flu. Do not treat yourself. This drug decreases your body's ability to fight infections. Try to avoid being around people who are sick. This medicine may increase your risk to bruise or bleed. Call your doctor or health care professional if you notice any unusual bleeding. Be careful brushing and flossing your teeth or using a toothpick because you may get an infection or bleed more easily. If you have any dental work done, tell your dentist you are receiving this medicine. Avoid taking products that contain aspirin, acetaminophen, ibuprofen, naproxen, or ketoprofen unless instructed by your doctor. These medicines may hide a fever. Call your doctor or health care professional if you get diarrhea or mouth sores. Do not treat yourself. To protect your kidneys, drink water or other fluids as directed while you are taking this medicine. Do not become pregnant while taking this medicine or for 6 months after stopping it. Women should inform their doctor if they wish to become pregnant or think they might be pregnant. Men should not father a child while taking this medicine and for 3 months after stopping it. This may interfere with the ability to father a child. You should talk to your doctor or health care professional if you are  concerned about your fertility. There is a potential for serious side effects to an unborn child. Talk to your health care professional or pharmacist for more information. Do not breast-feed an infant while taking this medicine or for 1 week after stopping it. What side effects may I notice from receiving this medicine? Side effects that you should report to your doctor or health care professional as soon as possible:  allergic reactions like skin rash, itching or hives, swelling of the face, lips, or tongue  breathing problems  redness, blistering, peeling or loosening of the skin, including inside the mouth  signs and symptoms of bleeding such as bloody or black, tarry  stools; red or dark-brown urine; spitting up blood or brown material that looks like coffee grounds; red spots on the skin; unusual bruising or bleeding from the eye, gums, or nose  signs and symptoms of infection like fever or chills; cough; sore throat; pain or trouble passing urine  signs and symptoms of kidney injury like trouble passing urine or change in the amount of urine  signs and symptoms of liver injury like dark yellow or brown urine; general ill feeling or flu-like symptoms; light-colored stools; loss of appetite; nausea; right upper belly pain; unusually weak or tired; yellowing of the eyes or skin Side effects that usually do not require medical attention (report to your doctor or health care professional if they continue or are bothersome):  constipation  mouth sores  nausea, vomiting  unusually weak or tired This list may not describe all possible side effects. Call your doctor for medical advice about side effects. You may report side effects to FDA at 1-800-FDA-1088. Where should I keep my medicine? This drug is given in a hospital or clinic and will not be stored at home. NOTE: This sheet is a summary. It may not cover all possible information. If you have questions about this medicine, talk to your doctor, pharmacist, or health care provider.  2021 Elsevier/Gold Standard (2017-11-02 16:11:33)  Carboplatin injection What is this medicine? CARBOPLATIN (KAR boe pla tin) is a chemotherapy drug. It targets fast dividing cells, like cancer cells, and causes these cells to die. This medicine is used to treat ovarian cancer and many other cancers. This medicine may be used for other purposes; ask your health care provider or pharmacist if you have questions. COMMON BRAND NAME(S): Paraplatin What should I tell my health care provider before I take this medicine? They need to know if you have any of these conditions:  blood disorders  hearing problems  kidney  disease  recent or ongoing radiation therapy  an unusual or allergic reaction to carboplatin, cisplatin, other chemotherapy, other medicines, foods, dyes, or preservatives  pregnant or trying to get pregnant  breast-feeding How should I use this medicine? This drug is usually given as an infusion into a vein. It is administered in a hospital or clinic by a specially trained health care professional. Talk to your pediatrician regarding the use of this medicine in children. Special care may be needed. Overdosage: If you think you have taken too much of this medicine contact a poison control center or emergency room at once. NOTE: This medicine is only for you. Do not share this medicine with others. What if I miss a dose? It is important not to miss a dose. Call your doctor or health care professional if you are unable to keep an appointment. What may interact with this medicine?  medicines for seizures  medicines to increase  blood counts like filgrastim, pegfilgrastim, sargramostim  some antibiotics like amikacin, gentamicin, neomycin, streptomycin, tobramycin  vaccines Talk to your doctor or health care professional before taking any of these medicines:  acetaminophen  aspirin  ibuprofen  ketoprofen  naproxen This list may not describe all possible interactions. Give your health care provider a list of all the medicines, herbs, non-prescription drugs, or dietary supplements you use. Also tell them if you smoke, drink alcohol, or use illegal drugs. Some items may interact with your medicine. What should I watch for while using this medicine? Your condition will be monitored carefully while you are receiving this medicine. You will need important blood work done while you are taking this medicine. This drug may make you feel generally unwell. This is not uncommon, as chemotherapy can affect healthy cells as well as cancer cells. Report any side effects. Continue your course of  treatment even though you feel ill unless your doctor tells you to stop. In some cases, you may be given additional medicines to help with side effects. Follow all directions for their use. Call your doctor or health care professional for advice if you get a fever, chills or sore throat, or other symptoms of a cold or flu. Do not treat yourself. This drug decreases your body's ability to fight infections. Try to avoid being around people who are sick. This medicine may increase your risk to bruise or bleed. Call your doctor or health care professional if you notice any unusual bleeding. Be careful brushing and flossing your teeth or using a toothpick because you may get an infection or bleed more easily. If you have any dental work done, tell your dentist you are receiving this medicine. Avoid taking products that contain aspirin, acetaminophen, ibuprofen, naproxen, or ketoprofen unless instructed by your doctor. These medicines may hide a fever. Do not become pregnant while taking this medicine. Women should inform their doctor if they wish to become pregnant or think they might be pregnant. There is a potential for serious side effects to an unborn child. Talk to your health care professional or pharmacist for more information. Do not breast-feed an infant while taking this medicine. What side effects may I notice from receiving this medicine? Side effects that you should report to your doctor or health care professional as soon as possible:  allergic reactions like skin rash, itching or hives, swelling of the face, lips, or tongue  signs of infection - fever or chills, cough, sore throat, pain or difficulty passing urine  signs of decreased platelets or bleeding - bruising, pinpoint red spots on the skin, black, tarry stools, nosebleeds  signs of decreased red blood cells - unusually weak or tired, fainting spells, lightheadedness  breathing problems  changes in hearing  changes in  vision  chest pain  high blood pressure  low blood counts - This drug may decrease the number of white blood cells, red blood cells and platelets. You may be at increased risk for infections and bleeding.  nausea and vomiting  pain, swelling, redness or irritation at the injection site  pain, tingling, numbness in the hands or feet  problems with balance, talking, walking  trouble passing urine or change in the amount of urine Side effects that usually do not require medical attention (report to your doctor or health care professional if they continue or are bothersome):  hair loss  loss of appetite  metallic taste in the mouth or changes in taste This list may not describe all  possible side effects. Call your doctor for medical advice about side effects. You may report side effects to FDA at 1-800-FDA-1088. Where should I keep my medicine? This drug is given in a hospital or clinic and will not be stored at home. NOTE: This sheet is a summary. It may not cover all possible information. If you have questions about this medicine, talk to your doctor, pharmacist, or health care provider.  2021 Elsevier/Gold Standard (2007-12-19 14:38:05)    Rehydration, Adult Rehydration is the replacement of body fluids, salts, and minerals (electrolytes) that are lost during dehydration. Dehydration is when there is not enough water or other fluids in the body. This happens when you lose more fluids than you take in. Common causes of dehydration include: Not drinking enough fluids. This can occur when you are ill or doing activities that require a lot of energy, especially in hot weather. Conditions that cause loss of water or other fluids, such as diarrhea, vomiting, sweating, or urinating a lot. Other illnesses, such as fever or infection. Certain medicines, such as those that remove excess fluid from the body (diuretics). Symptoms of mild or moderate dehydration may include thirst, dry lips  and mouth, and dizziness. Symptoms of severe dehydration may include increased heart rate, confusion, fainting, and not urinating. For severe dehydration, you may need to get fluids through an IV at the hospital. For mild or moderate dehydration, you can usually rehydrate at home by drinking certain fluids as told by your health care provider. What are the risks? Generally, rehydration is safe. However, taking in too much fluid (overhydration) can be a problem. This is rare. Overhydration can cause an electrolyte imbalance, kidney failure, or a decrease in salt (sodium) levels in the body. Supplies needed You will need an oral rehydration solution (ORS) if your health care provider tells you to use one. This is a drink to treat dehydration. It can be found in pharmacies and retail stores. How to rehydrate Fluids Follow instructions from your health care provider for rehydration. The kind of fluid and the amount you should drink depend on your condition. In general, you should choose drinks that you prefer. If told by your health care provider, drink an ORS. Make an ORS by following instructions on the package. Start by drinking small amounts, about  cup (120 mL) every 5-10 minutes. Slowly increase how much you drink until you have taken the amount recommended by your health care provider. Drink enough clear fluids to keep your urine pale yellow. If you were told to drink an ORS, finish it first, then start slowly drinking other clear fluids. Drink fluids such as: Water. This includes sparkling water and flavored water. Drinking only water can lead to having too little sodium in your body (hyponatremia). Follow the advice of your health care provider. Water from ice chips you suck on. Fruit juice with water you add to it (diluted). Sports drinks. Hot or cold herbal teas. Broth-based soups. Milk or milk products. Food Follow instructions from your health care provider about what to eat while you  rehydrate. Your health care provider may recommend that you slowly begin eating regular foods in small amounts. Eat foods that contain a healthy balance of electrolytes, such as bananas, oranges, potatoes, tomatoes, and spinach. Avoid foods that are greasy or contain a lot of sugar. In some cases, you may get nutrition through a feeding tube that is passed through your nose and into your stomach (nasogastric tube, or NG tube). This may be  done if you have uncontrolled vomiting or diarrhea.   Beverages to avoid Certain beverages may make dehydration worse. While you rehydrate, avoid drinking alcohol.   How to tell if you are recovering from dehydration You may be recovering from dehydration if: You are urinating more often than before you started rehydrating. Your urine is pale yellow. Your energy level improves. You vomit less frequently. You have diarrhea less frequently. Your appetite improves or returns to normal. You feel less dizzy or less light-headed. Your skin tone and color start to look more normal. Follow these instructions at home: Take over-the-counter and prescription medicines only as told by your health care provider. Do not take sodium tablets. Doing this can lead to having too much sodium in your body (hypernatremia). Contact a health care provider if: You continue to have symptoms of mild or moderate dehydration, such as: Thirst. Dry lips. Slightly dry mouth. Dizziness. Dark urine or less urine than normal. Muscle cramps. You continue to vomit or have diarrhea. Get help right away if you: Have symptoms of dehydration that get worse. Have a fever. Have a severe headache. Have been vomiting and the following happens: Your vomiting gets worse or does not go away. Your vomit includes blood or green matter (bile). You cannot eat or drink without vomiting. Have problems with urination or bowel movements, such as: Diarrhea that gets worse or does not go away. Blood  in your stool (feces). This may cause stool to look black and tarry. Not urinating, or urinating only a small amount of very dark urine, within 6-8 hours. Have trouble breathing. Have symptoms that get worse with treatment. These symptoms may represent a serious problem that is an emergency. Do not wait to see if the symptoms will go away. Get medical help right away. Call your local emergency services (911 in the [place] Do not drive yourself to the hospital. Summary Rehydration is the replacement of body fluids and minerals (electrolytes) that are lost during dehydration. Follow instructions from your health care provider for rehydration. The kind of fluid and amount you should drink depend on your condition. Slowly increase how much you drink until you have taken the amount recommended by your health care provider. Contact your health care provider if you continue to show signs of mild or moderate dehydration. This information is not intended to replace advice given to you by your health care provider. Make sure you discuss any questions you have with your health care provider. Document Revised: 11/14/2019 Document Reviewed: 09/24/2019 Elsevier Patient Education  2021 [employer].

## 2021-03-04 ENCOUNTER — Ambulatory Visit (INDEPENDENT_AMBULATORY_CARE_PROVIDER_SITE_OTHER): Payer: Medicare Other

## 2021-03-04 ENCOUNTER — Telehealth: Payer: Self-pay | Admitting: *Deleted

## 2021-03-04 DIAGNOSIS — I469 Cardiac arrest, cause unspecified: Secondary | ICD-10-CM | POA: Diagnosis not present

## 2021-03-04 LAB — CUP PACEART REMOTE DEVICE CHECK
Battery Remaining Longevity: 114 mo
Battery Voltage: 3 V
Brady Statistic RV Percent Paced: 0.01 %
Date Time Interrogation Session: 20220608033422
HighPow Impedance: 74 Ohm
Implantable Lead Implant Date: 20190606
Implantable Lead Location: 753860
Implantable Pulse Generator Implant Date: 20190606
Lead Channel Impedance Value: 304 Ohm
Lead Channel Impedance Value: 361 Ohm
Lead Channel Pacing Threshold Amplitude: 0.875 V
Lead Channel Pacing Threshold Pulse Width: 0.4 ms
Lead Channel Sensing Intrinsic Amplitude: 17.5 mV
Lead Channel Sensing Intrinsic Amplitude: 17.5 mV
Lead Channel Setting Pacing Amplitude: 2.5 V
Lead Channel Setting Pacing Pulse Width: 0.4 ms
Lead Channel Setting Sensing Sensitivity: 0.3 mV

## 2021-03-10 ENCOUNTER — Other Ambulatory Visit: Payer: Self-pay

## 2021-03-10 ENCOUNTER — Inpatient Hospital Stay: Payer: Medicare Other

## 2021-03-10 DIAGNOSIS — C3490 Malignant neoplasm of unspecified part of unspecified bronchus or lung: Secondary | ICD-10-CM

## 2021-03-10 DIAGNOSIS — Z5111 Encounter for antineoplastic chemotherapy: Secondary | ICD-10-CM | POA: Diagnosis not present

## 2021-03-10 DIAGNOSIS — Z7982 Long term (current) use of aspirin: Secondary | ICD-10-CM | POA: Diagnosis not present

## 2021-03-10 DIAGNOSIS — Z95828 Presence of other vascular implants and grafts: Secondary | ICD-10-CM

## 2021-03-10 DIAGNOSIS — Z86718 Personal history of other venous thrombosis and embolism: Secondary | ICD-10-CM | POA: Diagnosis not present

## 2021-03-10 DIAGNOSIS — Z79899 Other long term (current) drug therapy: Secondary | ICD-10-CM | POA: Diagnosis not present

## 2021-03-10 DIAGNOSIS — C3491 Malignant neoplasm of unspecified part of right bronchus or lung: Secondary | ICD-10-CM

## 2021-03-10 DIAGNOSIS — C3411 Malignant neoplasm of upper lobe, right bronchus or lung: Secondary | ICD-10-CM | POA: Diagnosis not present

## 2021-03-10 LAB — TSH: TSH: 6.481 u[IU]/mL — ABNORMAL HIGH (ref 0.320–4.118)

## 2021-03-10 LAB — CBC WITH DIFFERENTIAL (CANCER CENTER ONLY)
Abs Immature Granulocytes: 0.06 10*3/uL (ref 0.00–0.07)
Basophils Absolute: 0 10*3/uL (ref 0.0–0.1)
Basophils Relative: 0 %
Eosinophils Absolute: 0 10*3/uL (ref 0.0–0.5)
Eosinophils Relative: 1 %
HCT: 34.2 % — ABNORMAL LOW (ref 39.0–52.0)
Hemoglobin: 11 g/dL — ABNORMAL LOW (ref 13.0–17.0)
Immature Granulocytes: 1 %
Lymphocytes Relative: 7 %
Lymphs Abs: 0.5 10*3/uL — ABNORMAL LOW (ref 0.7–4.0)
MCH: 26 pg (ref 26.0–34.0)
MCHC: 32.2 g/dL (ref 30.0–36.0)
MCV: 80.9 fL (ref 80.0–100.0)
Monocytes Absolute: 0.1 10*3/uL (ref 0.1–1.0)
Monocytes Relative: 1 %
Neutro Abs: 6.7 10*3/uL (ref 1.7–7.7)
Neutrophils Relative %: 90 %
Platelet Count: 159 10*3/uL (ref 150–400)
RBC: 4.23 MIL/uL (ref 4.22–5.81)
RDW: 15.5 % (ref 11.5–15.5)
WBC Count: 7.4 10*3/uL (ref 4.0–10.5)
nRBC: 0 % (ref 0.0–0.2)

## 2021-03-10 LAB — CMP (CANCER CENTER ONLY)
ALT: 30 U/L (ref 0–44)
AST: 23 U/L (ref 15–41)
Albumin: 3 g/dL — ABNORMAL LOW (ref 3.5–5.0)
Alkaline Phosphatase: 125 U/L (ref 38–126)
Anion gap: 11 (ref 5–15)
BUN: 19 mg/dL (ref 6–20)
CO2: 27 mmol/L (ref 22–32)
Calcium: 9 mg/dL (ref 8.9–10.3)
Chloride: 91 mmol/L — ABNORMAL LOW (ref 98–111)
Creatinine: 0.79 mg/dL (ref 0.61–1.24)
GFR, Estimated: >60 mL/min (ref >60)
Glucose, Bld: 120 mg/dL — ABNORMAL HIGH (ref 70–99)
Potassium: 4.5 mmol/L (ref 3.5–5.1)
Sodium: 129 mmol/L — ABNORMAL LOW (ref 135–145)
Total Bilirubin: 0.5 mg/dL (ref 0.3–1.2)
Total Protein: 6.9 g/dL (ref 6.5–8.1)

## 2021-03-10 MED ORDER — HEPARIN SOD (PORK) LOCK FLUSH 100 UNIT/ML IV SOLN
500.0000 [IU] | Freq: Once | INTRAVENOUS | Status: AC
  Administered 2021-03-10: 500 [IU]
  Filled 2021-03-10: qty 5

## 2021-03-10 MED ORDER — SODIUM CHLORIDE 0.9% FLUSH
10.0000 mL | Freq: Once | INTRAVENOUS | Status: AC
  Administered 2021-03-10: 10 mL
  Filled 2021-03-10: qty 10

## 2021-03-16 ENCOUNTER — Ambulatory Visit: Payer: Medicare Other

## 2021-03-16 ENCOUNTER — Ambulatory Visit: Payer: Medicare Other | Admitting: Internal Medicine

## 2021-03-16 ENCOUNTER — Other Ambulatory Visit: Payer: Medicare Other

## 2021-03-17 ENCOUNTER — Inpatient Hospital Stay: Payer: Medicare Other

## 2021-03-17 ENCOUNTER — Other Ambulatory Visit: Payer: Self-pay

## 2021-03-17 DIAGNOSIS — Z79899 Other long term (current) drug therapy: Secondary | ICD-10-CM | POA: Diagnosis not present

## 2021-03-17 DIAGNOSIS — Z95828 Presence of other vascular implants and grafts: Secondary | ICD-10-CM

## 2021-03-17 DIAGNOSIS — C3491 Malignant neoplasm of unspecified part of right bronchus or lung: Secondary | ICD-10-CM

## 2021-03-17 DIAGNOSIS — Z86718 Personal history of other venous thrombosis and embolism: Secondary | ICD-10-CM | POA: Diagnosis not present

## 2021-03-17 DIAGNOSIS — C3411 Malignant neoplasm of upper lobe, right bronchus or lung: Secondary | ICD-10-CM | POA: Diagnosis not present

## 2021-03-17 DIAGNOSIS — Z7982 Long term (current) use of aspirin: Secondary | ICD-10-CM | POA: Diagnosis not present

## 2021-03-17 DIAGNOSIS — C3490 Malignant neoplasm of unspecified part of unspecified bronchus or lung: Secondary | ICD-10-CM

## 2021-03-17 DIAGNOSIS — Z5111 Encounter for antineoplastic chemotherapy: Secondary | ICD-10-CM | POA: Diagnosis not present

## 2021-03-17 LAB — CMP (CANCER CENTER ONLY)
ALT: 80 U/L — ABNORMAL HIGH (ref 0–44)
AST: 45 U/L — ABNORMAL HIGH (ref 15–41)
Albumin: 3.2 g/dL — ABNORMAL LOW (ref 3.5–5.0)
Alkaline Phosphatase: 113 U/L (ref 38–126)
Anion gap: 8 (ref 5–15)
BUN: 10 mg/dL (ref 6–20)
CO2: 28 mmol/L (ref 22–32)
Calcium: 8.5 mg/dL — ABNORMAL LOW (ref 8.9–10.3)
Chloride: 92 mmol/L — ABNORMAL LOW (ref 98–111)
Creatinine: 0.9 mg/dL (ref 0.61–1.24)
GFR, Estimated: >60 mL/min (ref >60)
Glucose, Bld: 118 mg/dL — ABNORMAL HIGH (ref 70–99)
Potassium: 4.5 mmol/L (ref 3.5–5.1)
Sodium: 128 mmol/L — ABNORMAL LOW (ref 135–145)
Total Bilirubin: 0.4 mg/dL (ref 0.3–1.2)
Total Protein: 6.7 g/dL (ref 6.5–8.1)

## 2021-03-17 LAB — CBC WITH DIFFERENTIAL (CANCER CENTER ONLY)
Abs Immature Granulocytes: 0.04 10*3/uL (ref 0.00–0.07)
Basophils Absolute: 0 10*3/uL (ref 0.0–0.1)
Basophils Relative: 0 %
Eosinophils Absolute: 0 10*3/uL (ref 0.0–0.5)
Eosinophils Relative: 0 %
HCT: 27.9 % — ABNORMAL LOW (ref 39.0–52.0)
Hemoglobin: 9.3 g/dL — ABNORMAL LOW (ref 13.0–17.0)
Immature Granulocytes: 3 %
Lymphocytes Relative: 29 %
Lymphs Abs: 0.4 10*3/uL — ABNORMAL LOW (ref 0.7–4.0)
MCH: 26 pg (ref 26.0–34.0)
MCHC: 33.3 g/dL (ref 30.0–36.0)
MCV: 77.9 fL — ABNORMAL LOW (ref 80.0–100.0)
Monocytes Absolute: 0.2 10*3/uL (ref 0.1–1.0)
Monocytes Relative: 18 %
Neutro Abs: 0.6 10*3/uL — ABNORMAL LOW (ref 1.7–7.7)
Neutrophils Relative %: 50 %
Platelet Count: 57 10*3/uL — ABNORMAL LOW (ref 150–400)
RBC: 3.58 MIL/uL — ABNORMAL LOW (ref 4.22–5.81)
RDW: 15.2 % (ref 11.5–15.5)
WBC Count: 1.3 10*3/uL — ABNORMAL LOW (ref 4.0–10.5)
nRBC: 0 % (ref 0.0–0.2)

## 2021-03-17 MED ORDER — HEPARIN SOD (PORK) LOCK FLUSH 100 UNIT/ML IV SOLN
500.0000 [IU] | Freq: Once | INTRAVENOUS | Status: AC
  Administered 2021-03-17: 500 [IU]
  Filled 2021-03-17: qty 5

## 2021-03-17 MED ORDER — SODIUM CHLORIDE 0.9% FLUSH
10.0000 mL | Freq: Once | INTRAVENOUS | Status: AC
  Administered 2021-03-17: 10 mL
  Filled 2021-03-17: qty 10

## 2021-03-18 ENCOUNTER — Encounter: Payer: Self-pay | Admitting: General Practice

## 2021-03-18 NOTE — Progress Notes (Signed)
[facility] Spiritual Care Note  Canceled [name]'s Advance Direct[facility] Appointment on Friday 6/24 per his wife [name] request (per permission to speak with her listed in his EMR). She notes that he has completed AD already and plans to bring them to his next appointment so staff can photocopy and deliver to Health Information Management for scanning into his chart.   [place], [place], [facility] ([facility]) Pager [phone] Voicemail [phone]

## 2021-03-18 NOTE — Progress Notes (Signed)
[facility] OFFICE PROGRESS NOTE  [place] School, MD [address] [place] [address]  DIAGNOSIS: Recurrent lung cancer initially diagnosed as stage IIIA (T3, N2, M0) non-small cell lung cancer, adenosquamous carcinoma diagnosed in June 2019 and presented with large right upper lobe lung mass with questionable chest wall invasion as well as right hilar and mediastinal lymphadenopathy.   Biomarker Findings Tumor Mutational Burden - TMB-High (21 Muts/Mb) Microsatellite status - MS-Stable Genomic Findings For a complete list of the genes assayed, please refer to the Appendix. ATM X4503* CCND2 P281R KRAS G12V CHEK2 T349fs*15 TP53 E298* 7 Disease relevant genes with no reportable alterations: ALK, EGFR, BRAF, MET, RET, ERBB2, ROS1   PDL1 Expression: 99%   PRIOR THERAPY:  1) Concurrent chemoradiation with chemotherapy consisting of weekly carboplatin for an AUC of 2 and paclitaxel 45 mg/m2.  First dose given on 04/03/2018.  Status post 6 cycles.  Last dose was giving 05/15/2018 with partial response. 2) Consolidation treatment with immunotherapy with Imfinzi (Durvalumab) 10 mg/KG every 2 weeks.  First dose 06/20/2018.  Status post 26 cycles. 3) Systemic treatment with immunotherapy with Keytruda 200 mg IV every 3 weeks.  First dose February 27, 2020. Status post 12 cycles. Discontinued due to disease progression.  CURRENT THERAPY:  Carboplatin for an AUC of 5 and Alimta 500 mg per metered squared on days 1 IV every 3 weeks.  First dose expected on 03/03/2021. Status post 1 cycle.   INTERVAL HISTORY: [name] 60 y.o. male returns to the clinic today for a follow up visit. The patient is feeling fair today without any concerning complaints except for he has been falling a lot recently. He states she gets impatient and his walker will slide out from in front of him. Of note, he has a right BKA. He has assistive devices at home and bedside commode. He also had a repeat brain MRI  in March 2022 which was negative for metastatic disease to the brain.  The patient has memory deficits at baseline and he is not sure of which medications he took this morning.  But he did state that he took his usual morning medications.  He has several medications in his med list for blood pressure including Lasix, spironolactone, and lisinopril.  The patient does not monitor his blood pressure at home.  His blood pressure is low today.   The patient recently was found to have evidence for disease progression. Therefore, Dr. Julien Nordmann switched his treatment to carboplatin and alimta. He is status post his first cycle and tolerated it fair except for one episode of nausea and vomiting. He had neutropenia with cycle #1.   He denies fevers, chills, night sweats, skin infections, dysuria, diarrhea, or sore throat.  He states he sometimes feels clammy but states that he "always" feels this way.  It is unclear if the patient is taking his Decadron and folic acid premedications.  He was evaluated by a member of the nutrition team at the last appointment due to weight loss.  The patient refused his weights today. He denies any chest pain or hemoptysis.  He reports his baseline shortness of breath with exertion and chronic cough. He continues to smoke about 1/2 a pack of cigarettes.  He uses a stool softener for constipation.  He denies any headache or visual changes. The patient is here today for evaluation before starting cycle #2  MEDICAL HISTORY: Past Medical History:  Diagnosis Date   Acute hepatitis C virus infection 06/19/2007  Qualifier: Diagnosis of  By: Leilani Merl CMA, Tiffany     Acute ST elevation myocardial infarction (STEMI) involving left anterior descending (LAD) coronary artery ([facility]) 05/26/2016   Acute ST elevation myocardial infarction (STEMI) of lateral wall ([facility]) 05/26/2016   AICD (automatic cardioverter/defibrillator) present 03/02/2018   Anxiety    Asthma    Atherosclerosis of native arteries  of the extremities with intermittent claudication 12/16/2011   Chronic hepatitis C without hepatic coma ([facility]) 05/03/2014   COPD with chronic bronchitis and emphysema ([facility]) 03/13/2018   FEV1 57%   Depression    DVT (deep venous thrombosis) ([facility]) 2009; 2019   RLE; LLE   Encounter for antineoplastic chemotherapy 03/22/2018   Hepatitis C    "tx'd in 2015"   High cholesterol    Ischemic cardiomyopathy 03/02/2018   Non-small cell carcinoma of right lung, stage 3 ([facility]) 03/22/2018   PAD (peripheral artery disease) ([facility]) 01/25/2013   Peripheral vascular disease, unspecified 07/20/2012   Port-A-Cath in place 04/24/2018   ST elevation myocardial infarction involving left anterior descending (LAD) coronary artery ([facility])    STEMI (ST elevation myocardial infarction) ([facility]) 05/26/2016   Tobacco abuse 01/28/2016   Tubular adenoma of colon 07/11/2014   Urinary dribbling     ALLERGIES:  is allergic to lyrica [pregabalin] and neurontin [gabapentin].  MEDICATIONS:  Current Outpatient Medications  Medication Sig Dispense Refill   albuterol (VENTOLIN HFA) 108 (90 Base) MCG/ACT inhaler SMARTSIG:1-2 Puff(s) Via Inhaler Every 4 Hours PRN     alprazolam (XANAX) 2 MG tablet Take 2 mg by mouth 4 (four) times daily as needed for anxiety.     amitriptyline (ELAVIL) 100 MG tablet Take 100 mg by mouth at bedtime.      aspirin 81 MG EC tablet Take 81 mg by mouth daily.     atorvastatin (LIPITOR) 80 MG tablet Take 1 tablet (80 mg total) by mouth daily at 6 PM. 30 tablet 12   carvedilol (COREG) 6.25 MG tablet TAKE 1 TABLET BY MOUTH TWICE A DAY WITH A MEAL NEEDS APPT FOR REFILLS 180 tablet 2   citalopram (CELEXA) 40 MG tablet Take 40 mg by mouth daily.  11   dexamethasone (DECADRON) 4 MG tablet Please take 1 tablet twice a day the day before, the day of, and the day after treatment 40 tablet 2   folic acid (FOLVITE) 1 MG tablet Take 1 tablet (1 mg total) by mouth daily. 30 tablet 2   furosemide (LASIX) 20 MG tablet Take 1 tablet  (20 mg total) by mouth daily. Please make overdue appt with Dr. Oval Linsey before anymore refills. 2nd attempt 15 tablet 0   HYDROcodone-acetaminophen (NORCO) 10-325 MG tablet Take 1 tablet by mouth every 4 (four) hours as needed for moderate pain.   0   levothyroxine (SYNTHROID) 125 MCG tablet Take 125 mcg by mouth daily.     lidocaine-prilocaine (EMLA) cream Apply 1 application topically as needed. 30 g 0   lisinopril (PRINIVIL,ZESTRIL) 2.5 MG tablet Take 1 tablet (2.5 mg total) by mouth daily. 30 tablet 12   nitroGLYCERIN (NITROSTAT) 0.4 MG SL tablet Place 1 tablet (0.4 mg total) under the tongue every 5 (five) minutes x 3 doses as needed for chest pain. 25 tablet 2   prochlorperazine (COMPAZINE) 10 MG tablet Take 1 tablet (10 mg total) by mouth every 6 (six) hours as needed for nausea or vomiting. 30 tablet 2   spironolactone (ALDACTONE) 25 MG tablet TAKE 1 TABLET BY MOUTH EVERY DAY 90 tablet 0  sucralfate (CARAFATE) 1 g tablet Take 1 tablet (1 g total) by mouth 4 (four) times daily -  with meals and at bedtime. 5 min before meals for radiation induced esophagitis 120 tablet 2   tamsulosin (FLOMAX) 0.4 MG CAPS capsule Take 1 capsule by mouth daily.     TRELEGY ELLIPTA 100-62.5-25 MCG/INH AEPB Inhale 1 puff into the lungs daily.     No current facility-administered medications for this visit.    SURGICAL HISTORY:  Past Surgical History:  Procedure Laterality Date   AORTOGRAM  08/08/2009   for LLE claudication     By Dr. 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   BIOPSY N/A 05/30/2014   Procedure: BIOPSY;  Surgeon: [name], MD;  Location: [facility];  Service: Endoscopy;  Laterality: N/A;   BLADDER TUMOR EXCISION  8/812   CARDIAC CATHETERIZATION N/A 05/26/2016   Procedure: Left Heart Cath and Coronary Angiography;  Surgeon: [name], MD;  Location: [facility];  Service: Cardiovascular;  Laterality: N/A;   CARDIAC CATHETERIZATION N/A 05/26/2016   Procedure: Coronary  Stent Intervention;  Surgeon: [name], MD;  Location: [facility];  Service: Cardiovascular;  Laterality: N/A;   COLONOSCOPY WITH PROPOFOL N/A 05/30/2014   WEX:HBZJIR colonic polyp-likely source of hematochezia-removed as described above   ESOPHAGOGASTRODUODENOSCOPY (EGD) WITH PROPOFOL N/A 05/30/2014   CVE:LFYBOF and bulbar erosions s/p gastric biopsy. No evidence of portal gastropathy on today's examination.   FEMORAL-TIBIAL BYPASS GRAFT  2009   Right side using non-reversed GSV   By Dr. Anson [place] BYPASS GRAFT  11/24/2007   Right femoral to anterior tibial BPG   by Dr. Oneida Alar   FINGER SURGERY Left    "straightened my pinky"   HAND TENDON SURGERY Left 2013   Left 5th finger   HERNIA REPAIR     "stomach"   ICD IMPLANT N/A 03/02/2018   Procedure: ICD IMPLANT;  Surgeon: [name], MD;  Location: [facility];  Service: Cardiovascular;  Laterality: N/A;   INCISIONAL HERNIA REPAIR N/A 12/25/2014   Procedure: HERNIA REPAIR INCISIONAL WITH MESH;  Surgeon: [name], MD;  Location: [facility];  Service: General;  Laterality: N/A;   INSERTION OF MESH N/A 12/25/2014   Procedure: INSERTION OF MESH;  Surgeon: [name], MD;  Location: [facility];  Service: General;  Laterality: N/A;   IR IMAGING GUIDED PORT INSERTION  04/11/2018   LAPAROSCOPIC CHOLECYSTECTOMY     LOWER EXTREMITY ANGIOGRAM Left 12/30/2015   Procedure: Lower Extremity Angiogram;  Surgeon: [name], MD;  Location: [facility];  Service: Cardiovascular;  Laterality: Left;   PERIPHERAL VASCULAR CATHETERIZATION N/A 12/30/2015   Procedure: Abdominal Aortogram;  Surgeon: [name], MD;  Location: [facility];  Service: Cardiovascular;  Laterality: N/A;   POLYPECTOMY  05/30/2014   Procedure: POLYPECTOMY;  Surgeon: [name], MD;  Location: [facility];  Service: Endoscopy;;   TONSILLECTOMY AND ADENOIDECTOMY  ~ 1970   TYMPANOSTOMY TUBE PLACEMENT Bilateral ~ 1970    REVIEW OF  SYSTEMS:   Review of Systems  Constitutional: Positive for fatigue and weight loss.  Negative for appetite change, chills and fever. HENT: Negative for mouth sores, nosebleeds, sore throat and trouble swallowing.   Eyes: Negative for eye problems and icterus.  Respiratory: Positive for baseline cough and shortness of breath with exertion. Negative for hemoptysis.  Cardiovascular: Negative for chest pain and leg swelling.  Gastrointestinal: Negative for abdominal pain, constipation, diarrhea, nausea and vomiting (resolved).  Genitourinary: Negative for bladder incontinence, difficulty urinating, dysuria, frequency and hematuria.   Musculoskeletal: Negative for back pain, gait problem, neck pain and neck stiffness.  Skin: Negative for itching and rash.  Neurological: Positive for falls. Negative for dizziness, extremity weakness,  headaches, light-headedness and seizures. Hematological: Negative for adenopathy. Does not bruise/bleed easily.  Psychiatric/Behavioral: Negative for confusion, depression and sleep disturbance. The patient is not nervous/anxious.     PHYSICAL EXAMINATION:  Blood pressure (!) 76/59, pulse 72, temperature 97.8 F (36.6 C), temperature source Oral, resp. rate 17, SpO2 100 %.  ECOG PERFORMANCE STATUS: 2  Physical Exam  Constitutional: Oriented to person, place, and time and chronically ill-appearing male and in no distress. HENT: Head: Normocephalic and atraumatic. Mouth/Throat: Oropharynx is clear and moist. No oropharyngeal exudate. Eyes: Conjunctivae are normal. Right eye exhibits no discharge. Left eye exhibits no discharge. No scleral icterus. Neck: Normal range of motion. Neck supple. Cardiovascular: Normal rate, regular rhythm, normal heart sounds and intact distal pulses.   Pulmonary/Chest:  Effort normal. Some expiratory wheezing on exam bilaterally. No respiratory distress. No rales. Abdominal: Soft. Bowel sounds are normal. Exhibits no distension and no  mass. There is no tenderness.  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 Normal range of motion. Exhibits no edema.  Lymphadenopathy:    No cervical adenopathy.  Neurological: Alert and oriented to person, place, and time. Exhibits normal muscle tone. Examined in the wheelchair. BKA on right leg.  Skin: Bruising on bilateral upper extremities.  Skin is warm and dry. No rash noted. Not diaphoretic. No erythema. No pallor.  Psychiatric: Mood  judgment normal.  Positive for memory deficits. Vitals reviewed.  LABORATORY DATA: Lab Results  Component Value Date   WBC 17.3 (H) 03/24/2021   HGB 9.5 (L) 03/24/2021   HCT 28.8 (L) 03/24/2021   MCV 80.0 03/24/2021   PLT 318 03/24/2021      Chemistry      Component Value Date/Time   NA 130 (L) 03/24/2021 0938   NA 139 02/27/2018 1457   K 4.3 03/24/2021 0938   CL 91 (L) 03/24/2021 0938   CO2 28 03/24/2021 0938   BUN 9 03/24/2021 0938   BUN 7 02/27/2018 1457   CREATININE 0.84 03/24/2021 0938   CREATININE 1.13 06/28/2016 1453      Component Value Date/Time   CALCIUM 8.7 (L) 03/24/2021 0938   ALKPHOS 136 (H) 03/24/2021 0938   AST 41 03/24/2021 0938   ALT 42 03/24/2021 0938   BILITOT 0.3 03/24/2021 0938       RADIOGRAPHIC STUDIES:  CUP PACEART REMOTE DEVICE CHECK  Result Date: 03/04/2021 Scheduled remote reviewed. Normal device function.  Thoracic impedance trending down Next remote 91 days. HB    ASSESSMENT/PLAN:  This is a very pleasant 60 year old Caucasian male with recurrent lung cancer initially diagnosed as stage IIIa non-small cell lung cancer, adenosquamous carcinoma.  He presented with a large right upper lobe lung mass with questionable chest wall invasion as well as a right hilar and mediastinal lymphadenopathy.  He was diagnosed in June 2019. His PDL1 expression is 99%   He completed 6 cycles of concurrent chemoradiation with carboplatin and paclitaxel.  He had a partial response to treatment.   The patient  underwent consolidation immunotherapy with Imfinzi 10 mg/kg IV every 2 weeks.  He is status post 26 cycles.  He completed this on 07/04/2019   He was on observation until he  showed evidence of local recurrence.   He then underwent immunotherapy with Keytruda 200 mg IV every 3 weeks. He is status post 9 cycles. He tolerated this well without any concerning complains. The patient has missed several appointments since November 2021. He only returned to the clinic once for treatment in that interval. Therefore, on his follow up imaging studies in March 2022, he had evidence of disease progession.  He restarted treatment at that time and continued to tolerate it well. Unfortunately, he had evidence for disease progression in May 2022 and this was discontinued.   The patient is currently undergoing treatment with systemic chemotherapy with carboplatin for an AUC of 5 and Alimta 500 mg/m IV every 3 weeks. He is status post 1 cycle and tolerated it fair except for one episode of nausea/vomiting.   The patient was seen with Dr. Julien Nordmann today.  I reviewed the patient's symptoms with Dr. Julien Nordmann as well as his blood work and vitals.  Dr. Julien Nordmann recommend that he proceed with cycle #2  today as scheduled. He received an additional 1 L of fluid while in the clinic today.   While in the infusion room today, the patient developed cardiac arrest while using the bathroom. A code was called and the patient was transferred to the ER for further care.   We will see him back for a follow up visit in 3 weeks for evaluation.   The patient's blood pressure is low today.  The patient believes that he took blood pressure medication this morning but is unsure which ones.  I have printed out his med list and have instructed him to hold his blood pressure medication if his blood pressure is low prior to taking his medication ( <120).  I instructed the patient to pick up a blood pressure cuff and monitor his blood pressure at  home.  Additionally, we have called the patient's primary care provider office to see if he would recommend discontinuing some or all of his blood pressure medications.  We will arrange for the patient to receive 1 L of IV fluid while in the clinic today.  Advised the patient to call if he has any signs and symptoms of infection.  Regarding the patient's falls, the patient has several assistive devices at home and reports that he is impatient which results in his falls.  We will see if we are able to get the patient in upright walker in hopes that this will help with fall prevention.  Otherwise, the patient was encouraged to take his time when ambulating or changing positions. Unclear if his hypotension has some role in his falls as well. The patient has some memory deficits and is an unreliable historian.   The patient was advised to call immediately if he has any concerning symptoms in the interval. The patient voices understanding of current disease status and treatment options and is in agreement with the current care plan. All questions were answered. The patient knows to call the clinic with any problems, questions or concerns. We can certainly see the patient much sooner if necessary           No orders of the defined types were placed in this encounter.    Daphyne Miguez L Shahira Fiske, PA-C 03/24/21  ADDENDUM: Hematology/Oncology Attending: I had a face-to-face encounter with the patient today.  I reviewed his record, lab and recommended his care plan.  This is a very pleasant 60 years old white male with metastatic non-small cell lung cancer  that was initially diagnosed as stage IIIa adenocarcinoma diagnosed in June 2019 with PD-L1 expression of 99%, status post a course of concurrent chemoradiation with weekly carboplatin and paclitaxel followed by treatment with consolidation immunotherapy with Imfinzi for 1 year.  The patient had evidence for disease progression and he was treated  with single agent Keytruda discontinued secondary to disease progression.  He was recently started on systemic chemotherapy with carboplatin for AUC of 5 and Alimta 500 Mg/M2 status post 1 cycle of treatment the patient tolerated the first cycle of his treatment well except for one episode of nausea and vomiting that resolved with his antiemetics medication. He came to the clinic today for evaluation before starting cycle #2.  He was a little bit weak and hypotensive but he took a lot of his blood pressure medication earlier today.  He otherwise was feeling fine with no chest pain, shortness of breath or palpitation. I recommended for the patient to proceed with cycle #2 today but for the hypotension we will give him 1 L of normal saline intravenously during the treatment. Unfortunately during his treatment the patient had cardiac arrest with V. tach when he was at the bathroom.  He underwent resuscitation and survive the event.  He was transferred to the emergency department for further evaluation and admission and monitoring of his condition. I met with his wife and updated her about his current status. If the patient survives this event, we will arrange for him a follow-up appointment in 3 weeks for reevaluation and discussion of any future treatment plans. The total time spent in the appointment was 40 minutes.  Disclaimer: This note was dictated with voice recognition software. Similar sounding words can inadvertently be transcribed and may be missed upon review. Eilleen Kempf, MD 03/24/21

## 2021-03-20 ENCOUNTER — Other Ambulatory Visit: Payer: Medicare Other | Admitting: General Practice

## 2021-03-24 ENCOUNTER — Emergency Department (HOSPITAL_COMMUNITY): Payer: Medicare Other

## 2021-03-24 ENCOUNTER — Other Ambulatory Visit: Payer: Self-pay

## 2021-03-24 ENCOUNTER — Emergency Department (HOSPITAL_COMMUNITY): Payer: Medicare Other | Admitting: Registered Nurse

## 2021-03-24 ENCOUNTER — Inpatient Hospital Stay: Payer: Medicare Other

## 2021-03-24 ENCOUNTER — Telehealth: Payer: Self-pay

## 2021-03-24 ENCOUNTER — Encounter (HOSPITAL_COMMUNITY): Payer: Self-pay | Admitting: Critical Care Medicine

## 2021-03-24 ENCOUNTER — Inpatient Hospital Stay (HOSPITAL_COMMUNITY): Payer: Medicare Other

## 2021-03-24 ENCOUNTER — Inpatient Hospital Stay (HOSPITAL_COMMUNITY)
Admission: EM | Admit: 2021-03-24 | Discharge: 2021-03-29 | DRG: 871 | Disposition: A | Discharge status: Home Health | Payer: Medicare Other | Attending: Internal Medicine | Admitting: Internal Medicine

## 2021-03-24 ENCOUNTER — Encounter: Payer: Self-pay | Admitting: Physician Assistant

## 2021-03-24 ENCOUNTER — Inpatient Hospital Stay: Payer: Medicare Other | Admitting: Nutrition

## 2021-03-24 ENCOUNTER — Encounter: Payer: Self-pay | Admitting: Internal Medicine

## 2021-03-24 ENCOUNTER — Inpatient Hospital Stay (HOSPITAL_BASED_OUTPATIENT_CLINIC_OR_DEPARTMENT_OTHER): Payer: Medicare Other | Admitting: Physician Assistant

## 2021-03-24 ENCOUNTER — Encounter: Payer: Self-pay | Admitting: General Practice

## 2021-03-24 VITALS — BP 76/59 | HR 72 | Temp 97.8°F | Resp 17

## 2021-03-24 DIAGNOSIS — C3490 Malignant neoplasm of unspecified part of unspecified bronchus or lung: Secondary | ICD-10-CM | POA: Diagnosis not present

## 2021-03-24 DIAGNOSIS — Z7982 Long term (current) use of aspirin: Secondary | ICD-10-CM

## 2021-03-24 DIAGNOSIS — E039 Hypothyroidism, unspecified: Secondary | ICD-10-CM

## 2021-03-24 DIAGNOSIS — I70201 Unspecified atherosclerosis of native arteries of extremities, right leg: Secondary | ICD-10-CM | POA: Diagnosis not present

## 2021-03-24 DIAGNOSIS — D72829 Elevated white blood cell count, unspecified: Secondary | ICD-10-CM | POA: Diagnosis not present

## 2021-03-24 DIAGNOSIS — E1165 Type 2 diabetes mellitus with hyperglycemia: Secondary | ICD-10-CM | POA: Diagnosis present

## 2021-03-24 DIAGNOSIS — Z95828 Presence of other vascular implants and grafts: Secondary | ICD-10-CM | POA: Diagnosis not present

## 2021-03-24 DIAGNOSIS — E1151 Type 2 diabetes mellitus with diabetic peripheral angiopathy without gangrene: Secondary | ICD-10-CM | POA: Diagnosis not present

## 2021-03-24 DIAGNOSIS — N400 Enlarged prostate without lower urinary tract symptoms: Secondary | ICD-10-CM | POA: Diagnosis present

## 2021-03-24 DIAGNOSIS — T501X5A Adverse effect of loop [high-ceiling] diuretics, initial encounter: Secondary | ICD-10-CM | POA: Diagnosis present

## 2021-03-24 DIAGNOSIS — R9431 Abnormal electrocardiogram [ECG] [EKG]: Secondary | ICD-10-CM | POA: Diagnosis not present

## 2021-03-24 DIAGNOSIS — J9601 Acute respiratory failure with hypoxia: Secondary | ICD-10-CM | POA: Diagnosis not present

## 2021-03-24 DIAGNOSIS — Z20822 Contact with and (suspected) exposure to covid-19: Secondary | ICD-10-CM | POA: Diagnosis not present

## 2021-03-24 DIAGNOSIS — I255 Ischemic cardiomyopathy: Secondary | ICD-10-CM | POA: Diagnosis present

## 2021-03-24 DIAGNOSIS — J4489 Other specified chronic obstructive pulmonary disease: Secondary | ICD-10-CM | POA: Diagnosis present

## 2021-03-24 DIAGNOSIS — C3491 Malignant neoplasm of unspecified part of right bronchus or lung: Secondary | ICD-10-CM

## 2021-03-24 DIAGNOSIS — E222 Syndrome of inappropriate secretion of antidiuretic hormone: Secondary | ICD-10-CM | POA: Diagnosis not present

## 2021-03-24 DIAGNOSIS — I462 Cardiac arrest due to underlying cardiac condition: Secondary | ICD-10-CM

## 2021-03-24 DIAGNOSIS — Z888 Allergy status to other drugs, medicaments and biological substances status: Secondary | ICD-10-CM

## 2021-03-24 DIAGNOSIS — I959 Hypotension, unspecified: Secondary | ICD-10-CM | POA: Diagnosis not present

## 2021-03-24 DIAGNOSIS — E7849 Other hyperlipidemia: Secondary | ICD-10-CM | POA: Diagnosis not present

## 2021-03-24 DIAGNOSIS — R571 Hypovolemic shock: Principal | ICD-10-CM | POA: Diagnosis present

## 2021-03-24 DIAGNOSIS — Z4682 Encounter for fitting and adjustment of non-vascular catheter: Secondary | ICD-10-CM | POA: Diagnosis not present

## 2021-03-24 DIAGNOSIS — J449 Chronic obstructive pulmonary disease, unspecified: Secondary | ICD-10-CM | POA: Diagnosis present

## 2021-03-24 DIAGNOSIS — I468 Cardiac arrest due to other underlying condition: Secondary | ICD-10-CM | POA: Diagnosis present

## 2021-03-24 DIAGNOSIS — I5022 Chronic systolic (congestive) heart failure: Secondary | ICD-10-CM | POA: Diagnosis present

## 2021-03-24 DIAGNOSIS — Z89511 Acquired absence of right leg below knee: Secondary | ICD-10-CM

## 2021-03-24 DIAGNOSIS — Z86718 Personal history of other venous thrombosis and embolism: Secondary | ICD-10-CM

## 2021-03-24 DIAGNOSIS — Z821 Family history of blindness and visual loss: Secondary | ICD-10-CM

## 2021-03-24 DIAGNOSIS — I469 Cardiac arrest, cause unspecified: Secondary | ICD-10-CM | POA: Diagnosis not present

## 2021-03-24 DIAGNOSIS — D630 Anemia in neoplastic disease: Secondary | ICD-10-CM | POA: Diagnosis not present

## 2021-03-24 DIAGNOSIS — E7800 Pure hypercholesterolemia, unspecified: Secondary | ICD-10-CM | POA: Diagnosis present

## 2021-03-24 DIAGNOSIS — F32A Depression, unspecified: Secondary | ICD-10-CM | POA: Diagnosis present

## 2021-03-24 DIAGNOSIS — Z79899 Other long term (current) drug therapy: Secondary | ICD-10-CM

## 2021-03-24 DIAGNOSIS — J439 Emphysema, unspecified: Secondary | ICD-10-CM | POA: Diagnosis present

## 2021-03-24 DIAGNOSIS — G40909 Epilepsy, unspecified, not intractable, without status epilepticus: Secondary | ICD-10-CM | POA: Diagnosis present

## 2021-03-24 DIAGNOSIS — Z955 Presence of coronary angioplasty implant and graft: Secondary | ICD-10-CM

## 2021-03-24 DIAGNOSIS — Z95810 Presence of automatic (implantable) cardiac defibrillator: Secondary | ICD-10-CM | POA: Diagnosis not present

## 2021-03-24 DIAGNOSIS — Z9911 Dependence on respirator [ventilator] status: Secondary | ICD-10-CM

## 2021-03-24 DIAGNOSIS — R680 Hypothermia, not associated with low environmental temperature: Secondary | ICD-10-CM | POA: Diagnosis present

## 2021-03-24 DIAGNOSIS — Z5111 Encounter for antineoplastic chemotherapy: Secondary | ICD-10-CM

## 2021-03-24 DIAGNOSIS — Z66 Do not resuscitate: Secondary | ICD-10-CM | POA: Diagnosis not present

## 2021-03-24 DIAGNOSIS — R569 Unspecified convulsions: Secondary | ICD-10-CM | POA: Diagnosis not present

## 2021-03-24 DIAGNOSIS — F419 Anxiety disorder, unspecified: Secondary | ICD-10-CM | POA: Diagnosis present

## 2021-03-24 DIAGNOSIS — I252 Old myocardial infarction: Secondary | ICD-10-CM

## 2021-03-24 DIAGNOSIS — R0603 Acute respiratory distress: Secondary | ICD-10-CM | POA: Diagnosis not present

## 2021-03-24 DIAGNOSIS — Z9114 Patient's other noncompliance with medication regimen: Secondary | ICD-10-CM

## 2021-03-24 DIAGNOSIS — I9589 Other hypotension: Secondary | ICD-10-CM | POA: Insufficient documentation

## 2021-03-24 DIAGNOSIS — F17210 Nicotine dependence, cigarettes, uncomplicated: Secondary | ICD-10-CM | POA: Diagnosis present

## 2021-03-24 DIAGNOSIS — I10 Essential (primary) hypertension: Secondary | ICD-10-CM | POA: Diagnosis not present

## 2021-03-24 DIAGNOSIS — Z7951 Long term (current) use of inhaled steroids: Secondary | ICD-10-CM

## 2021-03-24 DIAGNOSIS — Z79890 Hormone replacement therapy (postmenopausal): Secondary | ICD-10-CM

## 2021-03-24 DIAGNOSIS — I2510 Atherosclerotic heart disease of native coronary artery without angina pectoris: Secondary | ICD-10-CM | POA: Diagnosis present

## 2021-03-24 LAB — CBC WITH DIFFERENTIAL/PLATELET
Abs Immature Granulocytes: 0.8 10*3/uL — ABNORMAL HIGH (ref 0.00–0.07)
Basophils Absolute: 0 10*3/uL (ref 0.0–0.1)
Basophils Relative: 0 %
Eosinophils Absolute: 0 10*3/uL (ref 0.0–0.5)
Eosinophils Relative: 0 %
HCT: 30.2 % — ABNORMAL LOW (ref 39.0–52.0)
Hemoglobin: 9.4 g/dL — ABNORMAL LOW (ref 13.0–17.0)
Lymphocytes Relative: 14 %
Lymphs Abs: 3.6 10*3/uL (ref 0.7–4.0)
MCH: 26 pg (ref 26.0–34.0)
MCHC: 31.1 g/dL (ref 30.0–36.0)
MCV: 83.4 fL (ref 80.0–100.0)
Metamyelocytes Relative: 1 %
Monocytes Absolute: 1 10*3/uL (ref 0.1–1.0)
Monocytes Relative: 4 %
Myelocytes: 2 %
Neutro Abs: 20.5 10*3/uL — ABNORMAL HIGH (ref 1.7–7.7)
Neutrophils Relative %: 79 %
Platelets: 467 10*3/uL — ABNORMAL HIGH (ref 150–400)
RBC: 3.62 MIL/uL — ABNORMAL LOW (ref 4.22–5.81)
RDW: 16.7 % — ABNORMAL HIGH (ref 11.5–15.5)
WBC: 26 10*3/uL — ABNORMAL HIGH (ref 4.0–10.5)
nRBC: 0 % (ref 0.0–0.2)

## 2021-03-24 LAB — COMPREHENSIVE METABOLIC PANEL
ALT: 42 U/L (ref 0–44)
AST: 43 U/L — ABNORMAL HIGH (ref 15–41)
Albumin: 2.8 g/dL — ABNORMAL LOW (ref 3.5–5.0)
Alkaline Phosphatase: 121 U/L (ref 38–126)
Anion gap: 8 (ref 5–15)
BUN: 10 mg/dL (ref 6–20)
CO2: 27 mmol/L (ref 22–32)
Calcium: 7.8 mg/dL — ABNORMAL LOW (ref 8.9–10.3)
Chloride: 96 mmol/L — ABNORMAL LOW (ref 98–111)
Creatinine, Ser: 0.91 mg/dL (ref 0.61–1.24)
GFR, Estimated: >60 mL/min (ref >60)
Glucose, Bld: 241 mg/dL — ABNORMAL HIGH (ref 70–99)
Potassium: 4 mmol/L (ref 3.5–5.1)
Sodium: 131 mmol/L — ABNORMAL LOW (ref 135–145)
Total Bilirubin: 0.7 mg/dL (ref 0.3–1.2)
Total Protein: 6.2 g/dL — ABNORMAL LOW (ref 6.5–8.1)

## 2021-03-24 LAB — CMP (CANCER CENTER ONLY)
ALT: 42 U/L (ref 0–44)
AST: 41 U/L (ref 15–41)
Albumin: 2.6 g/dL — ABNORMAL LOW (ref 3.5–5.0)
Alkaline Phosphatase: 136 U/L — ABNORMAL HIGH (ref 38–126)
Anion gap: 11 (ref 5–15)
BUN: 9 mg/dL (ref 6–20)
CO2: 28 mmol/L (ref 22–32)
Calcium: 8.7 mg/dL — ABNORMAL LOW (ref 8.9–10.3)
Chloride: 91 mmol/L — ABNORMAL LOW (ref 98–111)
Creatinine: 0.84 mg/dL (ref 0.61–1.24)
GFR, Estimated: >60 mL/min (ref >60)
Glucose, Bld: 139 mg/dL — ABNORMAL HIGH (ref 70–99)
Potassium: 4.3 mmol/L (ref 3.5–5.1)
Sodium: 130 mmol/L — ABNORMAL LOW (ref 135–145)
Total Bilirubin: 0.3 mg/dL (ref 0.3–1.2)
Total Protein: 6.6 g/dL (ref 6.5–8.1)

## 2021-03-24 LAB — ECHOCARDIOGRAM COMPLETE
Area-P 1/2: 2.75 cm2
Calc EF: 47.4 %
Height: 72 in
Single Plane A2C EF: 47.2 %
Single Plane A4C EF: 47.4 %
Weight: 3315.72 oz

## 2021-03-24 LAB — CBC WITH DIFFERENTIAL (CANCER CENTER ONLY)
Abs Immature Granulocytes: 1.54 10*3/uL — ABNORMAL HIGH (ref 0.00–0.07)
Basophils Absolute: 0 10*3/uL (ref 0.0–0.1)
Basophils Relative: 0 %
Eosinophils Absolute: 0 10*3/uL (ref 0.0–0.5)
Eosinophils Relative: 0 %
HCT: 28.8 % — ABNORMAL LOW (ref 39.0–52.0)
Hemoglobin: 9.5 g/dL — ABNORMAL LOW (ref 13.0–17.0)
Immature Granulocytes: 9 %
Lymphocytes Relative: 3 %
Lymphs Abs: 0.5 10*3/uL — ABNORMAL LOW (ref 0.7–4.0)
MCH: 26.4 pg (ref 26.0–34.0)
MCHC: 33 g/dL (ref 30.0–36.0)
MCV: 80 fL (ref 80.0–100.0)
Monocytes Absolute: 0.8 10*3/uL (ref 0.1–1.0)
Monocytes Relative: 4 %
Neutro Abs: 14.5 10*3/uL — ABNORMAL HIGH (ref 1.7–7.7)
Neutrophils Relative %: 84 %
Platelet Count: 318 10*3/uL (ref 150–400)
RBC: 3.6 MIL/uL — ABNORMAL LOW (ref 4.22–5.81)
RDW: 16.3 % — ABNORMAL HIGH (ref 11.5–15.5)
WBC Count: 17.3 10*3/uL — ABNORMAL HIGH (ref 4.0–10.5)
nRBC: 0 % (ref 0.0–0.2)

## 2021-03-24 LAB — BLOOD GAS, ARTERIAL
Acid-Base Excess: 1.3 mmol/L (ref 0.0–2.0)
Bicarbonate: 27.5 mmol/L (ref 20.0–28.0)
FIO2: 60
MECHVT: 500 mL
O2 Saturation: 99.6 %
PEEP: 5 cmH2O
Patient temperature: 95.5
RATE: 20 resp/min
pCO2 arterial: 56.1 mmHg — ABNORMAL HIGH (ref 32.0–48.0)
pH, Arterial: 7.312 — ABNORMAL LOW (ref 7.350–7.450)
pO2, Arterial: 304 mmHg — ABNORMAL HIGH (ref 83.0–108.0)

## 2021-03-24 LAB — CBC
HCT: 28.6 % — ABNORMAL LOW (ref 39.0–52.0)
Hemoglobin: 9.1 g/dL — ABNORMAL LOW (ref 13.0–17.0)
MCH: 26.3 pg (ref 26.0–34.0)
MCHC: 31.8 g/dL (ref 30.0–36.0)
MCV: 82.7 fL (ref 80.0–100.0)
Platelets: 306 10*3/uL (ref 150–400)
RBC: 3.46 MIL/uL — ABNORMAL LOW (ref 4.22–5.81)
RDW: 16.7 % — ABNORMAL HIGH (ref 11.5–15.5)
WBC: 29 10*3/uL — ABNORMAL HIGH (ref 4.0–10.5)
nRBC: 0 % (ref 0.0–0.2)

## 2021-03-24 LAB — TYPE AND SCREEN
ABO/RH(D): A POS
Antibody Screen: NEGATIVE

## 2021-03-24 LAB — GLUCOSE, CAPILLARY
Glucose-Capillary: 129 mg/dL — ABNORMAL HIGH (ref 70–99)
Glucose-Capillary: 130 mg/dL — ABNORMAL HIGH (ref 70–99)
Glucose-Capillary: 166 mg/dL — ABNORMAL HIGH (ref 70–99)
Glucose-Capillary: 232 mg/dL — ABNORMAL HIGH (ref 70–99)

## 2021-03-24 LAB — CREATININE, SERUM
Creatinine, Ser: 0.89 mg/dL (ref 0.61–1.24)
GFR, Estimated: >60 mL/min (ref >60)

## 2021-03-24 LAB — PROCALCITONIN: Procalcitonin: <0.1 ng/mL

## 2021-03-24 LAB — RESP PANEL BY RT-PCR (FLU A&B, COVID) ARPGX2
Influenza A by PCR: NEGATIVE
Influenza B by PCR: NEGATIVE
SARS Coronavirus 2 by RT PCR: NEGATIVE

## 2021-03-24 LAB — T4, FREE: Free T4: 1.18 ng/dL — ABNORMAL HIGH (ref 0.61–1.12)

## 2021-03-24 LAB — HIV ANTIBODY (ROUTINE TESTING W REFLEX): HIV Screen 4th Generation wRfx: NONREACTIVE

## 2021-03-24 LAB — MRSA PCR SCREENING: MRSA by PCR: NEGATIVE

## 2021-03-24 LAB — BRAIN NATRIURETIC PEPTIDE
B Natriuretic Peptide: 111.1 pg/mL — ABNORMAL HIGH (ref 0.0–100.0)
B Natriuretic Peptide: 102.1 pg/mL — ABNORMAL HIGH (ref 0.0–100.0)

## 2021-03-24 LAB — TSH: TSH: 11.224 u[IU]/mL — ABNORMAL HIGH (ref 0.350–4.500)

## 2021-03-24 LAB — PROTIME-INR
INR: 1 (ref 0.8–1.2)
Prothrombin Time: 13.4 seconds (ref 11.4–15.2)

## 2021-03-24 LAB — TROPONIN I (HIGH SENSITIVITY)
Troponin I (High Sensitivity): 4 ng/L (ref <18)
Troponin I (High Sensitivity): 5 ng/L (ref <18)

## 2021-03-24 LAB — MAGNESIUM: Magnesium: 2.1 mg/dL (ref 1.7–2.4)

## 2021-03-24 LAB — LACTIC ACID, PLASMA: Lactic Acid, Venous: 1.9 mmol/L (ref 0.5–1.9)

## 2021-03-24 MED ORDER — SODIUM CHLORIDE 0.9 % IV SOLN
2.0000 g | Freq: Three times a day (TID) | INTRAVENOUS | Status: DC
  Administered 2021-03-24 – 2021-03-26 (×5): 2 g via INTRAVENOUS
  Filled 2021-03-24 (×6): qty 2

## 2021-03-24 MED ORDER — VANCOMYCIN HCL 2000 MG/400ML IV SOLN
2000.0000 mg | Freq: Once | INTRAVENOUS | Status: AC
  Administered 2021-03-24: 2000 mg via INTRAVENOUS
  Filled 2021-03-24: qty 400

## 2021-03-24 MED ORDER — FENTANYL BOLUS VIA INFUSION
50.0000 ug | INTRAVENOUS | Status: DC | PRN
  Filled 2021-03-24: qty 100

## 2021-03-24 MED ORDER — PERFLUTREN LIPID MICROSPHERE
1.0000 mL | INTRAVENOUS | Status: DC | PRN
  Administered 2021-03-24: 2 mL via INTRAVENOUS
  Filled 2021-03-24: qty 10

## 2021-03-24 MED ORDER — MIDAZOLAM HCL 2 MG/2ML IJ SOLN: 2.0000 mg | INTRAMUSCULAR | Status: DC | PRN

## 2021-03-24 MED ORDER — DOCUSATE SODIUM 50 MG/5ML PO LIQD: 100.0000 mg | Freq: Two times a day (BID) | ORAL | Status: DC

## 2021-03-24 MED ORDER — PALONOSETRON HCL INJECTION 0.25 MG/5ML
INTRAVENOUS | Status: AC
  Filled 2021-03-24: qty 5

## 2021-03-24 MED ORDER — SODIUM CHLORIDE 0.9 % IV SOLN
Freq: Once | INTRAVENOUS | Status: AC
  Filled 2021-03-24: qty 250

## 2021-03-24 MED ORDER — FENTANYL CITRATE (PF) 100 MCG/2ML IJ SOLN: 50.0000 ug | Freq: Once | INTRAMUSCULAR | Status: DC

## 2021-03-24 MED ORDER — MIDAZOLAM HCL 2 MG/2ML IJ SOLN
2.0000 mg | Freq: Once | INTRAMUSCULAR | Status: AC
  Administered 2021-03-24: 2 mg via INTRAVENOUS
  Filled 2021-03-24: qty 2

## 2021-03-24 MED ORDER — FENTANYL CITRATE (PF) 100 MCG/2ML IJ SOLN
50.0000 ug | Freq: Once | INTRAMUSCULAR | Status: AC
  Administered 2021-03-24: 50 ug via INTRAVENOUS
  Filled 2021-03-24: qty 2

## 2021-03-24 MED ORDER — FENTANYL CITRATE (PF) 100 MCG/2ML IJ SOLN
50.0000 ug | Freq: Once | INTRAMUSCULAR | Status: DC
  Filled 2021-03-24: qty 2

## 2021-03-24 MED ORDER — PANTOPRAZOLE SODIUM 40 MG PO PACK: 40.0000 mg | PACK | Freq: Every day | ORAL | Status: DC

## 2021-03-24 MED ORDER — SODIUM CHLORIDE 0.9% FLUSH
10.0000 mL | INTRAVENOUS | Status: DC | PRN
  Filled 2021-03-24: qty 10

## 2021-03-24 MED ORDER — POLYETHYLENE GLYCOL 3350 17 G PO PACK: 17.0000 g | PACK | Freq: Every day | ORAL | Status: DC | PRN

## 2021-03-24 MED ORDER — IPRATROPIUM-ALBUTEROL 0.5-2.5 (3) MG/3ML IN SOLN
3.0000 mL | RESPIRATORY_TRACT | Status: DC
  Administered 2021-03-24 – 2021-03-25 (×7): 3 mL via RESPIRATORY_TRACT
  Filled 2021-03-24 (×8): qty 3

## 2021-03-24 MED ORDER — SODIUM CHLORIDE 0.9 % IV SOLN
10.0000 mg | Freq: Once | INTRAVENOUS | Status: AC
  Administered 2021-03-24: 10 mg via INTRAVENOUS
  Filled 2021-03-24: qty 10

## 2021-03-24 MED ORDER — FENTANYL CITRATE (PF) 100 MCG/2ML IJ SOLN: 100.0000 ug | Freq: Once | INTRAMUSCULAR | Status: DC

## 2021-03-24 MED ORDER — SODIUM CHLORIDE 0.9% FLUSH
10.0000 mL | Freq: Once | INTRAVENOUS | Status: AC
Start: 2021-03-24 — End: 2021-03-24
  Administered 2021-03-24: 10 mL
  Filled 2021-03-24: qty 10

## 2021-03-24 MED ORDER — MIDAZOLAM HCL 2 MG/2ML IJ SOLN
2.0000 mg | INTRAMUSCULAR | Status: DC | PRN
  Administered 2021-03-24 (×2): 2 mg via INTRAVENOUS
  Filled 2021-03-24 (×2): qty 2

## 2021-03-24 MED ORDER — CHLORHEXIDINE GLUCONATE 0.12% ORAL RINSE (MEDLINE KIT): 15.0000 mL | Freq: Two times a day (BID) | OROMUCOSAL | Status: DC

## 2021-03-24 MED ORDER — ENOXAPARIN SODIUM 40 MG/0.4ML IJ SOSY
40.0000 mg | PREFILLED_SYRINGE | INTRAMUSCULAR | Status: DC
  Administered 2021-03-24 – 2021-03-28 (×5): 40 mg via SUBCUTANEOUS
  Filled 2021-03-24 (×5): qty 0.4

## 2021-03-24 MED ORDER — SODIUM CHLORIDE 0.9 % IV SOLN
728.5000 mg | Freq: Once | INTRAVENOUS | Status: AC
  Administered 2021-03-24: 730 mg via INTRAVENOUS
  Filled 2021-03-24: qty 73

## 2021-03-24 MED ORDER — FENTANYL CITRATE (PF) 100 MCG/2ML IJ SOLN
50.0000 ug | Freq: Once | INTRAMUSCULAR | Status: AC
  Administered 2021-03-24: 50 ug via INTRAVENOUS

## 2021-03-24 MED ORDER — FENTANYL 2500MCG IN NS 250ML (10MCG/ML) PREMIX INFUSION: 20.0000 ug/h | INTRAVENOUS | Status: DC

## 2021-03-24 MED ORDER — INSULIN ASPART 100 UNIT/ML IJ SOLN
2.0000 [IU] | INTRAMUSCULAR | Status: DC
Start: 2021-03-24 — End: 2021-03-25
  Administered 2021-03-24 (×2): 2 [IU] via SUBCUTANEOUS
  Administered 2021-03-24: 4 [IU] via SUBCUTANEOUS
  Administered 2021-03-25 (×2): 2 [IU] via SUBCUTANEOUS

## 2021-03-24 MED ORDER — DOCUSATE SODIUM 100 MG PO CAPS
100.0000 mg | ORAL_CAPSULE | Freq: Two times a day (BID) | ORAL | Status: DC | PRN
  Administered 2021-03-24 – 2021-03-25 (×2): 100 mg via ORAL
  Filled 2021-03-24 (×2): qty 1

## 2021-03-24 MED ORDER — PALONOSETRON HCL INJECTION 0.25 MG/5ML
0.2500 mg | Freq: Once | INTRAVENOUS | Status: AC
  Administered 2021-03-24: 0.25 mg via INTRAVENOUS

## 2021-03-24 MED ORDER — ORAL CARE MOUTH RINSE
15.0000 mL | OROMUCOSAL | Status: DC
  Administered 2021-03-24 (×2): 15 mL via OROMUCOSAL

## 2021-03-24 MED ORDER — VANCOMYCIN HCL 1500 MG/300ML IV SOLN
1500.0000 mg | Freq: Two times a day (BID) | INTRAVENOUS | Status: DC
  Administered 2021-03-25: 1500 mg via INTRAVENOUS
  Filled 2021-03-24: qty 300

## 2021-03-24 MED ORDER — SODIUM CHLORIDE 0.9 % IV SOLN
2.0000 g | Freq: Once | INTRAVENOUS | Status: AC
  Administered 2021-03-24: 2 g via INTRAVENOUS
  Filled 2021-03-24: qty 2

## 2021-03-24 MED ORDER — SODIUM CHLORIDE 0.9 % IV SOLN
500.0000 mg/m2 | Freq: Once | INTRAVENOUS | Status: AC
  Administered 2021-03-24: 1100 mg via INTRAVENOUS
  Filled 2021-03-24: qty 40

## 2021-03-24 MED ORDER — NOREPINEPHRINE 4 MG/250ML-% IV SOLN
0.0000 ug/min | INTRAVENOUS | Status: DC
  Administered 2021-03-24: 2 ug/min via INTRAVENOUS
  Filled 2021-03-24: qty 250

## 2021-03-24 MED ORDER — SODIUM CHLORIDE 0.9 % IV SOLN
500.0000 mg | INTRAVENOUS | Status: DC
  Administered 2021-03-24: 500 mg via INTRAVENOUS
  Filled 2021-03-24: qty 500

## 2021-03-24 MED ORDER — PREDNISONE 20 MG PO TABS: 40.0000 mg | ORAL_TABLET | Freq: Every day | ORAL | Status: DC

## 2021-03-24 MED ORDER — CHLORHEXIDINE GLUCONATE CLOTH 2 % EX PADS
6.0000 | MEDICATED_PAD | Freq: Every day | CUTANEOUS | Status: DC
  Administered 2021-03-24 – 2021-03-29 (×6): 6 via TOPICAL

## 2021-03-24 MED ORDER — SODIUM CHLORIDE 0.9 % IV SOLN
150.0000 mg | Freq: Once | INTRAVENOUS | Status: AC
  Administered 2021-03-24: 150 mg via INTRAVENOUS
  Filled 2021-03-24: qty 150

## 2021-03-24 MED ORDER — HYDROCODONE-ACETAMINOPHEN 5-325 MG PO TABS
1.0000 | ORAL_TABLET | ORAL | Status: AC | PRN
  Administered 2021-03-24: 2 via ORAL
  Administered 2021-03-25 – 2021-03-26 (×4): 1 via ORAL
  Filled 2021-03-24 (×3): qty 1
  Filled 2021-03-24: qty 2
  Filled 2021-03-24: qty 1

## 2021-03-24 MED ORDER — SODIUM CHLORIDE 0.9 % IV BOLUS: 2000.0000 mL | Freq: Once | INTRAVENOUS | Status: DC

## 2021-03-24 MED ORDER — HEPARIN SOD (PORK) LOCK FLUSH 100 UNIT/ML IV SOLN
500.0000 [IU] | Freq: Once | INTRAVENOUS | Status: DC | PRN
  Filled 2021-03-24: qty 5

## 2021-03-24 MED ORDER — POLYETHYLENE GLYCOL 3350 17 G PO PACK: 17.0000 g | PACK | Freq: Every day | ORAL | Status: DC

## 2021-03-24 MED ORDER — FENTANYL 2500MCG IN NS 250ML (10MCG/ML) PREMIX INFUSION
50.0000 ug/h | INTRAVENOUS | Status: DC
  Administered 2021-03-24: 50 ug/h via INTRAVENOUS
  Filled 2021-03-24: qty 250

## 2021-03-24 NOTE — ED Triage Notes (Signed)
Pt coming from [facility] center with cardiac arrest. Pt arrived at [facility] center today for chemo with BP of 70/50. A fluid bolus was given. Pt then got up to use restroom and was found on floor. Code blue was called. Pt was intubated and transferred to ED

## 2021-03-24 NOTE — Patient Instructions (Signed)
[facility]  Discharge Instructions: Thank you for choosing [facility] to provide your oncology and hematology care.   If you have a lab appointment with the [facility], please go directly to the [facility] and check in at the registration area.   Wear comfortable clothing and clothing appropriate for easy access to any Portacath or PICC line.   We strive to give you quality time with your provider. You may need to reschedule your appointment if you arrive late (15 or more minutes).  Arriving late affects you and other patients whose appointments are after yours.  Also, if you miss three or more appointments without notifying the office, you may be dismissed from the clinic at the provider's discretion.      For prescription refill requests, have your pharmacy contact our office and allow 72 hours for refills to be completed.    Today you received the following chemotherapy and/or immunotherapy agents: Pemetrexed (Alimta) and Carboplatin.   To help prevent nausea and vomiting after your treatment, we encourage you to take your nausea medication as directed.  BELOW ARE SYMPTOMS THAT SHOULD BE REPORTED IMMEDIATELY: *FEVER GREATER THAN 100.4 F (38 C) OR HIGHER *CHILLS OR SWEATING *NAUSEA AND VOMITING THAT IS NOT CONTROLLED WITH YOUR NAUSEA MEDICATION *UNUSUAL SHORTNESS OF BREATH *UNUSUAL BRUISING OR BLEEDING *URINARY PROBLEMS (pain or burning when urinating, or frequent urination) *BOWEL PROBLEMS (unusual diarrhea, constipation, pain near the anus) TENDERNESS IN MOUTH AND THROAT WITH OR WITHOUT PRESENCE OF ULCERS (sore throat, sores in mouth, or a toothache) UNUSUAL RASH, SWELLING OR PAIN  UNUSUAL VAGINAL DISCHARGE OR ITCHING   Items with * indicate a potential emergency and should be followed up as soon as possible or go to the Emergency Department if any problems should occur.  Please show the CHEMOTHERAPY ALERT CARD or IMMUNOTHERAPY  ALERT CARD at check-in to the Emergency Department and triage nurse.  Should you have questions after your visit or need to cancel or reschedule your appointment, please contact [facility]  Dept: [phone]  and follow the prompts.  Office hours are 8:00 a.m. to 4:30 p.m. Monday - Friday. Please note that voicemails left after 4:00 p.m. may not be returned until the following business day.  We are closed weekends and major holidays. You have access to a nurse at all times for urgent questions. Please call the main number to the clinic Dept: [phone] and follow the prompts.   For any non-urgent questions, you may also contact your provider using MyChart. We now offer e-Visits for anyone 110 and older to request care online for non-urgent symptoms. For details visit [email or web address].   Also download the MyChart app! Go to the app store, search "MyChart", open the app, select [facility], and log in with your MyChart username and password.  Due to Covid, a mask is required upon entering the hospital/clinic. If you do not have a mask, one will be given to you upon arrival. For doctor visits, patients may have 1 support person aged 40 or older with them. For treatment visits, patients cannot have anyone with them due to current Covid guidelines and our immunocompromised population.   Pemetrexed injection What is this medicine? PEMETREXED (PEM e TREX ed) is a chemotherapy drug used to treat lung cancers like non-small cell lung cancer and mesothelioma. It may also be used to treat other cancers. This medicine may be used for other purposes; ask your health care provider  or pharmacist if you have questions. COMMON BRAND NAME(S): Alimta What should I tell my health care provider before I take this medicine? They need to know if you have any of these conditions: infection (especially a virus infection such as chickenpox, cold sores, or herpes) kidney disease low  blood counts, like low white cell, platelet, or red cell counts lung or breathing disease, like asthma radiation therapy an unusual or allergic reaction to pemetrexed, other medicines, foods, dyes, or preservative pregnant or trying to get pregnant breast-feeding How should I use this medicine? This drug is given as an infusion into a vein. It is administered in a hospital or clinic by a specially trained health care professional. Talk to your pediatrician regarding the use of this medicine in children. Special care may be needed. Overdosage: If you think you have taken too much of this medicine contact a poison control center or emergency room at once. NOTE: This medicine is only for you. Do not share this medicine with others. What if I miss a dose? It is important not to miss your dose. Call your doctor or health care professional if you are unable to keep an appointment. What may interact with this medicine? This medicine may interact with the following medications: Ibuprofen This list may not describe all possible interactions. Give your health care provider a list of all the medicines, herbs, non-prescription drugs, or dietary supplements you use. Also tell them if you smoke, drink alcohol, or use illegal drugs. Some items may interact with your medicine. What should I watch for while using this medicine? Visit your doctor for checks on your progress. This drug may make you feel generally unwell. This is not uncommon, as chemotherapy can affect healthy cells as well as cancer cells. Report any side effects. Continue your course of treatment even though you feel ill unless your doctor tells you to stop. In some cases, you may be given additional medicines to help with side effects. Follow all directions for their use. Call your doctor or health care professional for advice if you get a fever, chills or sore throat, or other symptoms of a cold or flu. Do not treat yourself. This drug  decreases your body's ability to fight infections. Try to avoid being around people who are sick. This medicine may increase your risk to bruise or bleed. Call your doctor or health care professional if you notice any unusual bleeding. Be careful brushing and flossing your teeth or using a toothpick because you may get an infection or bleed more easily. If you have any dental work done, tell your dentist you are receiving this medicine. Avoid taking products that contain aspirin, acetaminophen, ibuprofen, naproxen, or ketoprofen unless instructed by your doctor. These medicines may hide a fever. Call your doctor or health care professional if you get diarrhea or mouth sores. Do not treat yourself. To protect your kidneys, drink water or other fluids as directed while you are taking this medicine. Do not become pregnant while taking this medicine or for 6 months after stopping it. Women should inform their doctor if they wish to become pregnant or think they might be pregnant. Men should not father a child while taking this medicine and for 3 months after stopping it. This may interfere with the ability to father a child. You should talk to your doctor or health care professional if you are concerned about your fertility. There is a potential for serious side effects to an unborn child. Talk to your  health care professional or pharmacist for more information. Do not breast-feed an infant while taking this medicine or for 1 week after stopping it. What side effects may I notice from receiving this medicine? Side effects that you should report to your doctor or health care professional as soon as possible: allergic reactions like skin rash, itching or hives, swelling of the face, lips, or tongue breathing problems redness, blistering, peeling or loosening of the skin, including inside the mouth signs and symptoms of bleeding such as bloody or black, tarry stools; red or dark-brown urine; spitting up blood  or brown material that looks like coffee grounds; red spots on the skin; unusual bruising or bleeding from the eye, gums, or nose signs and symptoms of infection like fever or chills; cough; sore throat; pain or trouble passing urine signs and symptoms of kidney injury like trouble passing urine or change in the amount of urine signs and symptoms of liver injury like dark yellow or brown urine; general ill feeling or flu-like symptoms; light-colored stools; loss of appetite; nausea; right upper belly pain; unusually weak or tired; yellowing of the eyes or skin Side effects that usually do not require medical attention (report to your doctor or health care professional if they continue or are bothersome): constipation mouth sores nausea, vomiting unusually weak or tired This list may not describe all possible side effects. Call your doctor for medical advice about side effects. You may report side effects to FDA at 1-800-FDA-1088. Where should I keep my medicine? This drug is given in a hospital or clinic and will not be stored at home. NOTE: This sheet is a summary. It may not cover all possible information. If you have questions about this medicine, talk to your doctor, pharmacist, or health care provider.  2021 Elsevier/Gold Standard (2017-11-02 16:11:33)  Carboplatin injection What is this medicine? CARBOPLATIN (KAR boe pla tin) is a chemotherapy drug. It targets fast dividing cells, like cancer cells, and causes these cells to die. This medicine is used to treat ovarian cancer and many other cancers. This medicine may be used for other purposes; ask your health care provider or pharmacist if you have questions. COMMON BRAND NAME(S): Paraplatin What should I tell my health care provider before I take this medicine? They need to know if you have any of these conditions: blood disorders hearing problems kidney disease recent or ongoing radiation therapy an unusual or allergic reaction to  carboplatin, cisplatin, other chemotherapy, other medicines, foods, dyes, or preservatives pregnant or trying to get pregnant breast-feeding How should I use this medicine? This drug is usually given as an infusion into a vein. It is administered in a hospital or clinic by a specially trained health care professional. Talk to your pediatrician regarding the use of this medicine in children. Special care may be needed. Overdosage: If you think you have taken too much of this medicine contact a poison control center or emergency room at once. NOTE: This medicine is only for you. Do not share this medicine with others. What if I miss a dose? It is important not to miss a dose. Call your doctor or health care professional if you are unable to keep an appointment. What may interact with this medicine? medicines for seizures medicines to increase blood counts like filgrastim, pegfilgrastim, sargramostim some antibiotics like amikacin, gentamicin, neomycin, streptomycin, tobramycin vaccines Talk to your doctor or health care professional before taking any of these medicines: acetaminophen aspirin ibuprofen ketoprofen naproxen This list may not describe  all possible interactions. Give your health care provider a list of all the medicines, herbs, non-prescription drugs, or dietary supplements you use. Also tell them if you smoke, drink alcohol, or use illegal drugs. Some items may interact with your medicine. What should I watch for while using this medicine? Your condition will be monitored carefully while you are receiving this medicine. You will need important blood work done while you are taking this medicine. This drug may make you feel generally unwell. This is not uncommon, as chemotherapy can affect healthy cells as well as cancer cells. Report any side effects. Continue your course of treatment even though you feel ill unless your doctor tells you to stop. In some cases, you may be given  additional medicines to help with side effects. Follow all directions for their use. Call your doctor or health care professional for advice if you get a fever, chills or sore throat, or other symptoms of a cold or flu. Do not treat yourself. This drug decreases your body's ability to fight infections. Try to avoid being around people who are sick. This medicine may increase your risk to bruise or bleed. Call your doctor or health care professional if you notice any unusual bleeding. Be careful brushing and flossing your teeth or using a toothpick because you may get an infection or bleed more easily. If you have any dental work done, tell your dentist you are receiving this medicine. Avoid taking products that contain aspirin, acetaminophen, ibuprofen, naproxen, or ketoprofen unless instructed by your doctor. These medicines may hide a fever. Do not become pregnant while taking this medicine. Women should inform their doctor if they wish to become pregnant or think they might be pregnant. There is a potential for serious side effects to an unborn child. Talk to your health care professional or pharmacist for more information. Do not breast-feed an infant while taking this medicine. What side effects may I notice from receiving this medicine? Side effects that you should report to your doctor or health care professional as soon as possible: allergic reactions like skin rash, itching or hives, swelling of the face, lips, or tongue signs of infection - fever or chills, cough, sore throat, pain or difficulty passing urine signs of decreased platelets or bleeding - bruising, pinpoint red spots on the skin, black, tarry stools, nosebleeds signs of decreased red blood cells - unusually weak or tired, fainting spells, lightheadedness breathing problems changes in hearing changes in vision chest pain high blood pressure low blood counts - This drug may decrease the number of white blood cells, red blood  cells and platelets. You may be at increased risk for infections and bleeding. nausea and vomiting pain, swelling, redness or irritation at the injection site pain, tingling, numbness in the hands or feet problems with balance, talking, walking trouble passing urine or change in the amount of urine Side effects that usually do not require medical attention (report to your doctor or health care professional if they continue or are bothersome): hair loss loss of appetite metallic taste in the mouth or changes in taste This list may not describe all possible side effects. Call your doctor for medical advice about side effects. You may report side effects to FDA at 1-800-FDA-1088. Where should I keep my medicine? This drug is given in a hospital or clinic and will not be stored at home. NOTE: This sheet is a summary. It may not cover all possible information. If you have questions about this medicine, talk  to your doctor, pharmacist, or health care provider.  2021 Elsevier/Gold Standard (2007-12-19 14:38:05)    Rehydration, Adult Rehydration is the replacement of body fluids, salts, and minerals (electrolytes) that are lost during dehydration. Dehydration is when there is not enough water or other fluids in the body. This happens when you lose more fluids than you take in. Common causes of dehydration include: Not drinking enough fluids. This can occur when you are ill or doing activities that require a lot of energy, especially in hot weather. Conditions that cause loss of water or other fluids, such as diarrhea, vomiting, sweating, or urinating a lot. Other illnesses, such as fever or infection. Certain medicines, such as those that remove excess fluid from the body (diuretics). Symptoms of mild or moderate dehydration may include thirst, dry lips and mouth, and dizziness. Symptoms of severe dehydration may include increased heart rate, confusion, fainting, and not urinating. For severe  dehydration, you may need to get fluids through an IV at the hospital. For mild or moderate dehydration, you can usually rehydrate at home by drinking certain fluids as told by your health care provider. What are the risks? Generally, rehydration is safe. However, taking in too much fluid (overhydration) can be a problem. This is rare. Overhydration can cause an electrolyte imbalance, kidney failure, or a decrease in salt (sodium) levels in the body. Supplies needed You will need an oral rehydration solution (ORS) if your health care provider tells you to use one. This is a drink to treat dehydration. It can be found in pharmacies and retail stores. How to rehydrate Fluids Follow instructions from your health care provider for rehydration. The kind of fluid and the amount you should drink depend on your condition. In general, you should choose drinks that you prefer. If told by your health care provider, drink an ORS. Make an ORS by following instructions on the package. Start by drinking small amounts, about  cup (120 mL) every 5-10 minutes. Slowly increase how much you drink until you have taken the amount recommended by your health care provider. Drink enough clear fluids to keep your urine pale yellow. If you were told to drink an ORS, finish it first, then start slowly drinking other clear fluids. Drink fluids such as: Water. This includes sparkling water and flavored water. Drinking only water can lead to having too little sodium in your body (hyponatremia). Follow the advice of your health care provider. Water from ice chips you suck on. Fruit juice with water you add to it (diluted). Sports drinks. Hot or cold herbal teas. Broth-based soups. Milk or milk products. Food Follow instructions from your health care provider about what to eat while you rehydrate. Your health care provider may recommend that you slowly begin eating regular foods in small amounts. Eat foods that contain a  healthy balance of electrolytes, such as bananas, oranges, potatoes, tomatoes, and spinach. Avoid foods that are greasy or contain a lot of sugar. In some cases, you may get nutrition through a feeding tube that is passed through your nose and into your stomach (nasogastric tube, or NG tube). This may be done if you have uncontrolled vomiting or diarrhea.   Beverages to avoid Certain beverages may make dehydration worse. While you rehydrate, avoid drinking alcohol.   How to tell if you are recovering from dehydration You may be recovering from dehydration if: You are urinating more often than before you started rehydrating. Your urine is pale yellow. Your energy level improves. You vomit  less frequently. You have diarrhea less frequently. Your appetite improves or returns to normal. You feel less dizzy or less light-headed. Your skin tone and color start to look more normal. Follow these instructions at home: Take over-the-counter and prescription medicines only as told by your health care provider. Do not take sodium tablets. Doing this can lead to having too much sodium in your body (hypernatremia). Contact a health care provider if: You continue to have symptoms of mild or moderate dehydration, such as: Thirst. Dry lips. Slightly dry mouth. Dizziness. Dark urine or less urine than normal. Muscle cramps. You continue to vomit or have diarrhea. Get help right away if you: Have symptoms of dehydration that get worse. Have a fever. Have a severe headache. Have been vomiting and the following happens: Your vomiting gets worse or does not go away. Your vomit includes blood or green matter (bile). You cannot eat or drink without vomiting. Have problems with urination or bowel movements, such as: Diarrhea that gets worse or does not go away. Blood in your stool (feces). This may cause stool to look black and tarry. Not urinating, or urinating only a small amount of very dark urine,  within 6-8 hours. Have trouble breathing. Have symptoms that get worse with treatment. These symptoms may represent a serious problem that is an emergency. Do not wait to see if the symptoms will go away. Get medical help right away. Call your local emergency services (911 in the [place] Do not drive yourself to the hospital. Summary Rehydration is the replacement of body fluids and minerals (electrolytes) that are lost during dehydration. Follow instructions from your health care provider for rehydration. The kind of fluid and amount you should drink depend on your condition. Slowly increase how much you drink until you have taken the amount recommended by your health care provider. Contact your health care provider if you continue to show signs of mild or moderate dehydration. This information is not intended to replace advice given to you by your health care provider. Make sure you discuss any questions you have with your health care provider. Document Revised: 11/14/2019 Document Reviewed: 09/24/2019 Elsevier Patient Education  2021 [employer].

## 2021-03-24 NOTE — Progress Notes (Signed)
Patient was assisted to the bathroom via wheelchair by 2 NT's. While in bathroom NT's became concerned and patient did not respond to knock on the door. Patient was found unresponsive slumped over the toilet. He id have a weak pulse with agonal breathing. Patient was laid on back board and chest compressions and respirations initiated and code team arrived. Patient transported to the ED by code team with port in place and IVF in place.

## 2021-03-24 NOTE — Procedures (Signed)
Extubation Procedure Note  Patient Details:   Name: NUR KRASINSKI DOB: [birth year] MRN: [id number]   Airway Documentation:    Vent end date: 03/24/21 Vent end time: 1623   Evaluation  O2 sats: stable throughout Complications: No apparent complications Patient did tolerate procedure well. Bilateral Breath Sounds: Rhonchi, Diminished   Yes Cuff leak present, patient extubated without complication. Placed on 3lpm [place]  and tolerating well  Joellyn Rued 03/24/2021, 4:24 PM

## 2021-03-24 NOTE — Progress Notes (Signed)
Passed SBT on 5/5, awake, calm, following commands. Refusing bipap after extubation.  Extubate to [facility].  Julian Hy, DO 03/24/21 4:24 PM [facility]

## 2021-03-24 NOTE — Telephone Encounter (Signed)
At direction of Cassandra PA-C, I have called pts PCP's office to report his ongoing low BP. Today it was 76/59. Pt is currently taking Lasix, Coreg, Aldactone and Lisinopril. I have LM regarding this requesting they contact the pt asap with medication adjustment recommendations.

## 2021-03-24 NOTE — Progress Notes (Signed)
Brief nutrition follow-up completed with patient during infusion for recurrent non-small cell lung cancer. Patient refused to be weighed today.  Last body weight documented was 195.5 pounds on June 7. Reports he ate pretzels and a [employer] so far today.  He generally does not eat after 6 PM in the evening.  He reports his appetite is good and he denies nutrition impact symptoms.  He has not tried oral nutrition supplements but is willing to do so today.  Nutrition diagnosis: Unintended weight loss cannot be evaluated.  Intervention: Patient to continue to consume smaller more frequent meals and snacks providing increased calories and protein. Provided samples of oral nutrition supplements.  Recommended patient drink 2 daily between meals. Encourage patient to strive for weight maintenance.  Monitoring, evaluation, goals: Patient will tolerate adequate calories and protein to minimize weight loss.  Next visit: To be scheduled as needed.  Patient has contact information for questions or concerns.  **Disclaimer: This note was dictated with voice recognition software. Similar sounding words can inadvertently be transcribed and this note may contain transcription errors which may not have been corrected upon publication of note.**

## 2021-03-24 NOTE — ED Provider Notes (Signed)
[place]  Department of Emergency Medicine   Code Blue CONSULT NOTE  Chief Complaint: Cardiac arrest/unresponsive   Level V Caveat: Unresponsive  History of present illness: I was contacted by the hospital for a CODE BLUE cardiac arrest upstairs and presented to the patient's bedside.   Patient cancer center to receive chemotherapy when he went unresponsive in bathroom.  PEA arrest.  Received a couple rounds of chest compressions and epinephrine.  Intubated by anesthesia.  Transferred to ER for further care afterwards.  ROS: Unable to obtain, Level V caveat  Scheduled Meds:  enoxaparin (LOVENOX) injection  40 mg Subcutaneous Q24H   fentaNYL (SUBLIMAZE) injection  50 mcg Intravenous Once   ipratropium-albuterol  3 mL Nebulization Q4H   pantoprazole sodium  40 mg Per Tube Daily   Continuous Infusions:  ceFEPime (MAXIPIME) IV     [START ON 03/25/2021] ceFEPime (MAXIPIME) IV     fentaNYL infusion INTRAVENOUS     norepinephrine (LEVOPHED) Adult infusion 2 mcg/min (03/24/21 1423)   [START ON 03/25/2021] vancomycin     vancomycin     PRN Meds:.docusate sodium, polyethylene glycol Past Medical History:  Diagnosis Date   Acute hepatitis C virus infection 06/19/2007   Qualifier: Diagnosis of  By: Leilani Merl CMA, Tiffany     Acute ST elevation myocardial infarction (STEMI) involving left anterior descending (LAD) coronary artery ([place]) 05/26/2016   Acute ST elevation myocardial infarction (STEMI) of lateral wall (HCC) 05/26/2016   AICD (automatic cardioverter/defibrillator) present 03/02/2018   Anxiety    Asthma    Atherosclerosis of native arteries of the extremities with intermittent claudication 12/16/2011   Chronic hepatitis C without hepatic coma ([place]) 05/03/2014   COPD with chronic bronchitis and emphysema ([place]) 03/13/2018   FEV1 57%   Depression    DVT (deep venous thrombosis) ([place]) 2009; 2019   RLE; LLE   Encounter for antineoplastic chemotherapy 03/22/2018   Hepatitis  C    "tx'd in 2015"   High cholesterol    Ischemic cardiomyopathy 03/02/2018   Non-small cell carcinoma of right lung, stage 3 ([place]) 03/22/2018   PAD (peripheral artery disease) ([place]) 01/25/2013   Peripheral vascular disease, unspecified 07/20/2012   Port-A-Cath in place 04/24/2018   ST elevation myocardial infarction involving left anterior descending (LAD) coronary artery (H[facility])    STEMI (ST elevation myocardial infarction) ([place]) 05/26/2016   Tobacco abuse 01/28/2016   Tubular adenoma of colon 07/11/2014   Urinary dribbling    Past Surgical History:  Procedure Laterality Date   AORTOGRAM  08/08/2009   for LLE claudication     By Dr. 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   BIOPSY N/A 05/30/2014   Procedure: BIOPSY;  Surgeon: [name], MD;  Location: [facility];  Service: Endoscopy;  Laterality: N/A;   BLADDER TUMOR EXCISION  8/812   CARDIAC CATHETERIZATION N/A 05/26/2016   Procedure: Left Heart Cath and Coronary Angiography;  Surgeon: [name], MD;  Location: [facility];  Service: Cardiovascular;  Laterality: N/A;   CARDIAC CATHETERIZATION N/A 05/26/2016   Procedure: Coronary Stent Intervention;  Surgeon: [name], MD;  Location: [place] [facility];  Service: Cardiovascular;  Laterality: N/A;   COLONOSCOPY WITH PROPOFOL N/A 05/30/2014   GGY:IRSWNI colonic polyp-likely source of hematochezia-removed as described above   ESOPHAGOGASTRODUODENOSCOPY (EGD) WITH PROPOFOL N/A 05/30/2014   OEV:[id number] and bulbar erosions s/p gastric biopsy. No evidence of portal gastropathy on today's examination.   FEMORAL-TIBIAL BYPASS GRAFT  2009  Right side using non-reversed GSV   By Dr. Anson [place] BYPASS GRAFT  11/24/2007   Right femoral to anterior tibial BPG   by Dr. Oneida Alar   FINGER SURGERY Left    "straightened my pinky"   HAND TENDON SURGERY Left 2013   Left 5th finger   HERNIA REPAIR     "stomach"   ICD IMPLANT N/A 03/02/2018   Procedure: ICD IMPLANT;   Surgeon: [name], MD;  Location: [place];  Service: Cardiovascular;  Laterality: N/A;   INCISIONAL HERNIA REPAIR N/A 12/25/2014   Procedure: HERNIA REPAIR INCISIONAL WITH MESH;  Surgeon: [name], MD;  Location: [facility];  Service: General;  Laterality: N/A;   INSERTION OF MESH N/A 12/25/2014   Procedure: INSERTION OF MESH;  Surgeon: [name], MD;  Location: [facility];  Service: General;  Laterality: N/A;   IR IMAGING GUIDED PORT INSERTION  04/11/2018   LAPAROSCOPIC CHOLECYSTECTOMY     LOWER EXTREMITY ANGIOGRAM Left 12/30/2015   Procedure: Lower Extremity Angiogram;  Surgeon: [name], MD;  Location: [facility];  Service: Cardiovascular;  Laterality: Left;   PERIPHERAL VASCULAR CATHETERIZATION N/A 12/30/2015   Procedure: Abdominal Aortogram;  Surgeon: [name], MD;  Location: [facility] [facility];  Service: Cardiovascular;  Laterality: N/A;   POLYPECTOMY  05/30/2014   Procedure: POLYPECTOMY;  Surgeon: [name], MD;  Location: [facility];  Service: Endoscopy;;   TONSILLECTOMY AND ADENOIDECTOMY  ~ 1970   TYMPANOSTOMY TUBE PLACEMENT Bilateral ~ 1970   Social History   Socioeconomic History   Marital status: Married    Spouse name: Not on file   Number of children: Not on file   Years of education: Not on file   Highest education level: Not on file  Occupational History   Not on file  Tobacco Use   Smoking status: Every Day    Packs/day: 0.50    Years: 31.00    Pack years: 15.50    Types: Cigarettes   Smokeless tobacco: Never  Vaping Use   Vaping Use: Never used  Substance and Sexual Activity   Alcohol use: Yes    Alcohol/week: 18.0 standard drinks    Types: 18 Cans of beer per week    Comment:  history of heavy ETOH use; 03/02/2018 "6-12 beers Fri & Sat"   Drug use: Yes    Types: Marijuana    Comment: 03/02/2018 "weekly"   Sexual activity: Not Currently  Other Topics Concern   Not on file  Social History Narrative   Not on file    Social Determinants of Health   Financial Resource Strain: Not on file  Food Insecurity: Not on file  Transportation Needs: Not on file  Physical Activity: Not on file  Stress: Not on file  Social Connections: Not on file  Intimate Partner Violence: Not on file   Allergies  Allergen Reactions   Lyrica [Pregabalin] Palpitations    Chest pain and heart palps.   Neurontin [Gabapentin] Palpitations    Chest pain and heart palp    Last set of Vital Signs (not current) Vitals:   03/24/21 1400 03/24/21 1415  BP: 92/67   Pulse: 87 83  Resp: (!) 21 17  Temp:  (!) 92.9 F (33.8 C)  SpO2: 100% 100%      Physical Exam  Gen: unresponsive Cardiovascular: pulseless  Resp: apneic. Breath sounds equal bilaterally with bagging  Abd: nondistended  Neuro: GCS 3, unresponsive  to pain  HEENT: No blood in posterior pharynx, gag reflex absent  Neck: No crepitus  Musculoskeletal: No deformity  Skin: warm    Medical Decision making  Responded to CODE BLUE at outpatient cancer center.  On my arrival, CPR in progress.  Anesthesia arrived just prior to my arrival. They intubated. Transferred to ER for further care.   Assessment and Plan  60 y/o lung cancer patient CODE blue at [facility]. Transfer to ER for further care.    Mark Starch, MD 03/24/21 1447

## 2021-03-24 NOTE — Progress Notes (Signed)
Bedside swallow eval done with nurse. Patient tolerates PO fulids fine and was able to take pills whole. Requested new diet order. Awaiting orders. Will continue to assess.

## 2021-03-24 NOTE — ED Provider Notes (Signed)
[facility] DEPT Provider Note   CSN: [id number] Arrival date & time: 03/24/21  1331     History No chief complaint on file. Chief complaint: Cardiac arrest  [name] is a 60 y.o. male.  Presents to ER after cardiac arrest.  Patient has history of non-small cell lung cancer, appendectomy cancer infusion center today for chemotherapy.  Patient was found on bathroom floor unresponsive.  CODE BLUE initiated.  Per RN staff patient was in PEA, no shockable rhythm.  Received only a couple rounds of epi and chest compressions.  Additional history obtained from Dr. Earlie Server, states patient arrived to the cancer center alert, awake, baseline mental status.  Was noted to have soft blood pressure.  HPI     Past Medical History:  Diagnosis Date   Acute hepatitis C virus infection 06/19/2007   Qualifier: Diagnosis of  By: Leilani Merl CMA, Tiffany     Acute ST elevation myocardial infarction (STEMI) involving left anterior descending (LAD) coronary artery (HCC) 05/26/2016   Acute ST elevation myocardial infarction (STEMI) of lateral wall (HCC) 05/26/2016   AICD (automatic cardioverter/defibrillator) present 03/02/2018   Anxiety    Asthma    Atherosclerosis of native arteries of the extremities with intermittent claudication 12/16/2011   Chronic hepatitis C without hepatic coma ([place]) 05/03/2014   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   FEV1 57%   Depression    DVT (deep venous thrombosis) ([place]) 2009; 2019   RLE; LLE   Encounter for antineoplastic chemotherapy 03/22/2018   Hepatitis C    "tx'd in 2015"   High cholesterol    Ischemic cardiomyopathy 03/02/2018   Non-small cell carcinoma of right lung, stage 3 ([place]) 03/22/2018   PAD (peripheral artery disease) ([place]) 01/25/2013   Peripheral vascular disease, unspecified 07/20/2012   Port-A-Cath in place 04/24/2018   ST elevation myocardial infarction involving left anterior descending (LAD) coronary  artery (HCC)    STEMI (ST elevation myocardial infarction) ([place]) 05/26/2016   Tobacco abuse 01/28/2016   Tubular adenoma of colon 07/11/2014   Urinary dribbling     Patient Active Problem List   Diagnosis Date Noted   Hypotension 03/24/2021   Cardiac arrest ([place]) 03/24/2021   Recurrent non-small cell lung cancer (NSCLC) ([place]) 12/22/2020   Seizures ([place]) 06/30/2018   Seizure-like activity ([place]) 06/26/2018   Encounter for antineoplastic immunotherapy 06/12/2018   Port-A-Cath in place 04/24/2018   Non-small cell carcinoma of right lung, stage 3 ([place]) 03/22/2018   Goals of care, counseling/discussion 03/22/2018   Encounter for antineoplastic chemotherapy 03/22/2018   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   Mass of upper lobe of right lung 03/08/2018   Ischemic cardiomyopathy 03/02/2018   Medication management 07/09/2016   Acute ST elevation myocardial infarction (STEMI) of lateral wall (HCC) 05/26/2016   Acute ST elevation myocardial infarction (STEMI) involving left anterior descending (LAD) coronary artery ([place]) 05/26/2016   ST elevation myocardial infarction involving left anterior descending (LAD) coronary artery ([place])    Tobacco abuse 01/28/2016   Tubular adenoma of colon 07/11/2014   Chronic hepatitis C without hepatic coma ([place]) 05/03/2014   Encounter for screening colonoscopy 05/03/2014   PAD (peripheral artery disease) ([place]) 01/25/2013   Peripheral vascular disease, unspecified 07/20/2012   Atherosclerosis of native arteries of the extremities with intermittent claudication 12/16/2011   Acute hepatitis C virus infection 06/19/2007   SEBORRHEIC KERATOSIS 06/13/2007   BLOOD CHEMISTRY, ABNORMAL 06/13/2007   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   ANXIETY DISORDER,  GENERALIZED 05/30/2007   LEG PAIN, RIGHT 11/26/2003    Past Surgical History:  Procedure Laterality Date   AORTOGRAM  08/08/2009   for LLE claudication     By Dr. Oneida Alar   BELOW KNEE LEG AMPUTATION  Right 04/25/2008   BIOPSY N/A 05/30/2014   Procedure: BIOPSY;  Surgeon: [name], MD;  Location: [facility];  Service: Endoscopy;  Laterality: N/A;   BLADDER TUMOR EXCISION  8/812   CARDIAC CATHETERIZATION N/A 05/26/2016   Procedure: Left Heart Cath and Coronary Angiography;  Surgeon: [name], MD;  Location: [facility];  Service: Cardiovascular;  Laterality: N/A;   CARDIAC CATHETERIZATION N/A 05/26/2016   Procedure: Coronary Stent Intervention;  Surgeon: [name], MD;  Location: [facility];  Service: Cardiovascular;  Laterality: N/A;   COLONOSCOPY WITH PROPOFOL N/A 05/30/2014   DJM:EQASTM colonic polyp-likely source of hematochezia-removed as described above   ESOPHAGOGASTRODUODENOSCOPY (EGD) WITH PROPOFOL N/A 05/30/2014   HDQ:QIWLNL and bulbar erosions s/p gastric biopsy. No evidence of portal gastropathy on today's examination.   FEMORAL-TIBIAL BYPASS GRAFT  2009   Right side using non-reversed GSV   By Dr. Anson [place] BYPASS GRAFT  11/24/2007   Right femoral to anterior tibial BPG   by Dr. Oneida Alar   FINGER SURGERY Left    "straightened my pinky"   HAND TENDON SURGERY Left 2013   Left 5th finger   HERNIA REPAIR     "stomach"   ICD IMPLANT N/A 03/02/2018   Procedure: ICD IMPLANT;  Surgeon: [name], MD;  Location: [facility];  Service: Cardiovascular;  Laterality: N/A;   INCISIONAL HERNIA REPAIR N/A 12/25/2014   Procedure: HERNIA REPAIR INCISIONAL WITH MESH;  Surgeon: [name], MD;  Location: [facility];  Service: General;  Laterality: N/A;   INSERTION OF MESH N/A 12/25/2014   Procedure: INSERTION OF MESH;  Surgeon: [name], MD;  Location: [facility];  Service: General;  Laterality: N/A;   IR IMAGING GUIDED PORT INSERTION  04/11/2018   LAPAROSCOPIC CHOLECYSTECTOMY     LOWER EXTREMITY ANGIOGRAM Left 12/30/2015   Procedure: Lower Extremity Angiogram;  Surgeon: [name], MD;  Location: [facility];  Service:  Cardiovascular;  Laterality: Left;   PERIPHERAL VASCULAR CATHETERIZATION N/A 12/30/2015   Procedure: Abdominal Aortogram;  Surgeon: [name], MD;  Location: [facility];  Service: Cardiovascular;  Laterality: N/A;   POLYPECTOMY  05/30/2014   Procedure: POLYPECTOMY;  Surgeon: [name], MD;  Location: [facility];  Service: Endoscopy;;   TONSILLECTOMY AND ADENOIDECTOMY  ~ 1970   TYMPANOSTOMY TUBE PLACEMENT Bilateral ~ 1970       Family History  Problem Relation Age of Onset   Vision loss Mother    Ulcers Father    Colon cancer Neg Hx     Social History   Tobacco Use   Smoking status: Every Day    Packs/day: 0.50    Years: 31.00    Pack years: 15.50    Types: Cigarettes   Smokeless tobacco: Never  Vaping Use   Vaping Use: Never used  Substance Use Topics   Alcohol use: Yes    Alcohol/week: 18.0 standard drinks    Types: 18 Cans of beer per week    Comment:  history of heavy ETOH use; 03/02/2018 "6-12 beers Fri & Sat"   Drug use: Yes    Types: Marijuana    Comment: 03/02/2018 "weekly"  Home Medications Prior to Admission medications   Medication Sig Start Date End Date Taking? Authorizing Provider  albuterol (VENTOLIN HFA) 108 (90 Base) MCG/ACT inhaler SMARTSIG:1-2 Puff(s) Via Inhaler Every 4 Hours PRN 06/30/20   [provider]  alprazolam [name]) 2 MG tablet Take 2 mg by mouth 4 (four) times daily as needed for anxiety.    [provider]  amitriptyline (ELAVIL) 100 MG tablet Take 100 mg by mouth at bedtime.     [provider]  aspirin 81 MG EC tablet Take 81 mg by mouth daily.    [provider]  atorvastatin (LIPITOR) 80 MG tablet Take 1 tablet (80 mg total) by mouth daily at 6 PM. 05/28/16   [name], NP  carvedilol (COREG) 6.25 MG tablet TAKE 1 TABLET BY MOUTH TWICE A DAY WITH A MEAL NEEDS APPT FOR REFILLS 02/18/21   Camnitz, Ocie Doyne, MD  citalopram (CELEXA) 40 MG tablet Take 40 mg by mouth daily. 12/02/14   [provider]  dexamethasone (DECADRON) 4 MG tablet Please take 1 tablet twice a day the day before, the day of, and the day after treatment 02/24/21   Heilingoetter, Cassandra L, PA-C  folic acid (FOLVITE) 1 MG tablet Take 1 tablet (1 mg total) by mouth daily. 02/24/21   Heilingoetter, Cassandra L, PA-C  furosemide (LASIX) 20 MG tablet Take 1 tablet (20 mg total) by mouth daily. Please make overdue appt with Dr. Oval Linsey before anymore refills. 2nd attempt 06/22/19   [name], MD  HYDROcodone-acetaminophen [facility]) 10-325 MG tablet Take 1 tablet by mouth every 4 (four) hours as needed for moderate pain.  06/22/16   [provider]  levothyroxine (SYNTHROID) 125 MCG tablet Take 125 mcg by mouth daily. 12/04/20   [provider]  lidocaine-prilocaine (EMLA) cream Apply 1 application topically as needed. 04/17/18   Maryanna Shape, NP  lisinopril (PRINIVIL,ZESTRIL) 2.5 MG tablet Take 1 tablet (2.5 mg total) by mouth daily. 05/28/16   [name], NP  nitroGLYCERIN (NITROSTAT) 0.4 MG SL tablet Place 1 tablet (0.4 mg total) under the tongue every 5 (five) minutes x 3 doses as needed for chest pain. 05/28/16   [name], NP  prochlorperazine (COMPAZINE) 10 MG tablet Take 1 tablet (10 mg total) by mouth every 6 (six) hours as needed for nausea or vomiting. 02/24/21   Heilingoetter, Cassandra L, PA-C  spironolactone (ALDACTONE) 25 MG tablet TAKE 1 TABLET BY MOUTH EVERY DAY 03/22/19   Camnitz, Ocie Doyne, MD  sucralfate (CARAFATE) 1 g tablet Take 1 tablet (1 g total) by mouth 4 (four) times daily -  with meals and at bedtime. 5 min before meals for radiation induced esophagitis 04/14/18   Tyler Pita, MD  tamsulosin (FLOMAX) 0.4 MG CAPS capsule Take 1 capsule by mouth daily. 01/29/18   [provider]  TRELEGY ELLIPTA 100-62.5-25 MCG/INH AEPB Inhale 1 puff into the lungs daily. 12/13/20   [provider]    Allergies    Lyrica [pregabalin] and Neurontin  [gabapentin]  Review of Systems   Review of Systems  Unable to perform ROS: Acuity of condition   Physical Exam Updated Vital Signs BP 92/67   Pulse 83   Temp (!) 92.9 F (33.8 C)   Resp 17   Ht 6' (1.829 m)   Wt 94 kg   SpO2 100%   BMI 28.11 kg/m   Physical Exam Vitals and nursing note reviewed.  Constitutional:  Comments: Intubated, localizes pain in all 4 extremities, pupils equal round and reactive  HENT:     Head: Normocephalic and atraumatic.     Mouth/Throat:     Comments: Intubated Eyes:     Pupils: Pupils are equal, round, and reactive to light.  Cardiovascular:     Rate and Rhythm: Normal rate.  Pulmonary:     Comments: Breath sounds clear bilaterally, intubated Abdominal:     General: Abdomen is flat. There is no distension.     Tenderness: There is no abdominal tenderness.  Musculoskeletal:        General: No deformity or signs of injury.     Cervical back: No tenderness.  Skin:    General: Skin is warm and dry.  Neurological:     Comments: Pupils are reactive, patient is localizing pain, has some spontaneous movement in all 4 extremities  Psychiatric:        Mood and Affect: Mood normal.        Behavior: Behavior normal.    ED Results / Procedures / Treatments   Labs (all labs ordered are listed, but only abnormal results are displayed) Labs Reviewed  CBC WITH DIFFERENTIAL/PLATELET - Abnormal; Notable for the following components:      Result Value   WBC 26.0 (*)    RBC 3.62 (*)    Hemoglobin 9.4 (*)    HCT 30.2 (*)    RDW 16.7 (*)    Platelets 467 (*)    Neutro Abs 20.5 (*)    Abs Immature Granulocytes 0.80 (*)    All other components within normal limits  CULTURE, BLOOD (ROUTINE X 2)  CULTURE, BLOOD (ROUTINE X 2)  RESP PANEL BY RT-PCR (FLU A&B, COVID) ARPGX2  COMPREHENSIVE METABOLIC PANEL  BRAIN NATRIURETIC PEPTIDE  MAGNESIUM  URINALYSIS, ROUTINE W REFLEX MICROSCOPIC  CBC  CREATININE, SERUM  BRAIN NATRIURETIC PEPTIDE  HIV  ANTIBODY (ROUTINE TESTING W REFLEX)  BLOOD GAS, ARTERIAL  PROTIME-INR  CBG MONITORING, ED  I-STAT CHEM 8, ED  TYPE AND SCREEN  TROPONIN I (HIGH SENSITIVITY)    EKG EKG Interpretation  Date/Time:  Tuesday March 24 2021 13:35:59 EDT Ventricular Rate:  99 PR Interval:  164 QRS Duration: 124 QT Interval:  399 QTC Calculation: 513 R Axis:   -87 Text Interpretation: Uncertain rhythm: review Right atrial enlargement Nonspecific IVCD with LAD Consider inferior infarct Anterolateral infarct, age indeterminate ST elevation, consider inferior injury no acute STEMI Confirmed by Madalyn Rob [phone]) on 03/24/2021 3:23:48 PM  Radiology DG Chest Portable 1 View  Result Date: 03/24/2021 CLINICAL DATA:  post intubation EXAM: PORTABLE CHEST 1 VIEW COMPARISON:  Chest CT 02/20/2021, radiograph 03/21/2018 FINDINGS: Endotracheal tube tip overlies the mid trachea. There is a chest port catheter with tip overlying the superior cavoatrial junction. Unchanged single lead AICD. Defibrillator pads overlie the right and left chest. There is a nasogastric tube with side port and tip overlying the stomach. Unchanged cardiomediastinal silhouette. Right apical consolidation with cavitary change consistent with findings on recent chest CT. There are no other areas of visible airspace consolidation. There is no large pleural effusion or visible pneumothorax. IMPRESSION: Endotracheal tube tip overlies the mid trachea. Right apical consolidation with cavitary change consistent with findings on recent chest CT in May 2022. No other new focal airspace abnormalities. Electronically Signed   By: Maurine Simmering   On: 03/24/2021 14:55    Procedures .Critical Care  Date/Time: 03/24/2021 3:23 PM Performed by: Lucrezia Starch, MD Authorized by: Roslynn Amble  Ellwood Dense, MD   Critical care provider statement:    Critical care time (minutes):  42   Critical care was necessary to treat or prevent imminent or life-threatening  deterioration of the following conditions:  Cardiac failure and shock   Critical care was time spent personally by me on the following activities:  Discussions with consultants, evaluation of patient's response to treatment, examination of patient, ordering and performing treatments and interventions, ordering and review of laboratory studies, ordering and review of radiographic studies, pulse oximetry, re-evaluation of patient's condition, obtaining history from patient or surrogate and review of old charts   Medications Ordered in ED Medications  norepinephrine (LEVOPHED) 4mg  in 273mL premix infusion (2 mcg/min Intravenous New Bag/Given 03/24/21 1423)  docusate sodium (COLACE) capsule 100 mg (has no administration in time range)  polyethylene glycol (MIRALAX / GLYCOLAX) packet 17 g (has no administration in time range)  enoxaparin (LOVENOX) injection 40 mg (has no administration in time range)  ipratropium-albuterol (DUONEB) 0.5-2.5 (3) MG/3ML nebulizer solution 3 mL (has no administration in time range)  pantoprazole sodium (PROTONIX) 40 mg/20 mL oral suspension 40 mg (has no administration in time range)  vancomycin (VANCOREADY) IVPB 2000 mg/400 mL (has no administration in time range)  vancomycin (VANCOREADY) IVPB 1500 mg/300 mL (has no administration in time range)  ceFEPIme (MAXIPIME) 2 g in sodium chloride 0.9 % 100 mL IVPB (has no administration in time range)  ceFEPIme (MAXIPIME) 2 g in sodium chloride 0.9 % 100 mL IVPB (has no administration in time range)  midazolam (VERSED) injection 2 mg (has no administration in time range)  fentaNYL (SUBLIMAZE) injection 50 mcg (has no administration in time range)  fentaNYL 2575mcg in NS 211mL (106mcg/ml) infusion-PREMIX (has no administration in time range)  midazolam (VERSED) injection 2 mg (2 mg Intravenous Given 03/24/21 1424)  fentaNYL (SUBLIMAZE) injection 50 mcg (50 mcg Intravenous Given 03/24/21 1423)  fentaNYL (SUBLIMAZE) injection 50 mcg  (50 mcg Intravenous Given 03/24/21 1423)    ED Course  I have reviewed the triage vital signs and the nursing notes.  Pertinent labs & imaging results that were available during my care of the patient were reviewed by me and considered in my medical decision making (see chart for details).    MDM Rules/Calculators/A&P                          60 year old male with history of non-small cell lung cancer, heart failure (EF 20 to 25%) presents to ER after code event.  Patient was found to be unresponsive, pulseless and apneic cancer center this afternoon in bathroom.  Reportedly PEA, no shockable rhythms.  On arrival to the emergency room, patient was noted to be hypotensive, was started to have some purposeful movements.  Anesthesia intubated during code and RSI meds were not used.  Started Levophed, bolused 1 L fluid.  Given HF, hesistant to fluid overload with more bolusing for now. EKG without obvious ischemic change.  Discussed case with critical care who came down to bedside and assumed care.  He was noted to be mildly hypothermic and started on broad-spectrum antibiotics to cover sepsis of possible etiology of presentation today.  Wife also mentioned some seizure like episodes lately. At time of admission, CT head pending to rule out acute intracranial pathology.  Critical care will continue resuscitation follow-up on pending studies.  Discussed goals of care with wife.  She states that they had recently completed some advance directives but  patient had not yet completed formal DNR paperwork.  She does not think patient would want to be on life support.  Discussed with critical care.  They will continue conversation with patient to determine goals of care and CODE STATUS going forward.  Final Clinical Impression(s) / ED Diagnoses Final diagnoses:  Cardiac arrest ([place])  Malignant neoplasm of lung, unspecified laterality, unspecified part of lung [facility])    Rx / DC Orders ED Discharge Orders      None        Lucrezia Starch, MD 03/24/21 1524

## 2021-03-24 NOTE — Progress Notes (Signed)
[facility] Progress Note Patient Name: [name] DOB: December 14, 1960 MRN: [id number]   Date of Service  03/24/2021  HPI/Events of Note  Patient passed a bedside swallow evaluation, bedside RN is requesting a clear liquid diet.  eICU Interventions  Clear liquid diet ordered.        Kerry Kass Brittnie Lewey 03/24/2021, 9:21 PM

## 2021-03-24 NOTE — Progress Notes (Signed)
eLink Physician-Brief Progress Note Patient Name: [name] DOB: [birth year] MRN: [id number]   Date of Service  03/24/2021  HPI/Events of Note  Patient is s/p cardiac arrest, and was just extubated, etiology of cardiac arrest unclear, he is requesting resumption of his home Norco, Elavil and Xanax.  eICU Interventions  Norco ordered, but Elavil and Xanax held overnight.        Kerry Kass Donisha Hoch 03/24/2021, 11:17 PM

## 2021-03-24 NOTE — Anesthesia Procedure Notes (Signed)
Procedure Name: Intubation Date/Time: 03/24/2021 1:17 PM Performed by: Victoriano Lain, CRNA Pre-anesthesia Checklist: Patient identified, Emergency Drugs available, Suction available, Patient being monitored and Timeout performed Patient Re-evaluated:Patient Re-evaluated prior to induction Oxygen Delivery Method: Ambu bag Preoxygenation: Pre-oxygenation with 100% oxygen Induction Type: Cricoid Pressure applied Ventilation: Mask ventilation without difficulty Laryngoscope Size: Mac and 4 Grade View: Grade I Tube type: Oral Tube size: 7.5 mm Number of attempts: 1 Airway Equipment and Method: Stylet Placement Confirmation: ETT inserted through vocal cords under direct vision, CO2 detector and breath sounds checked- equal and bilateral Secured at: 23 cm Tube secured with: Tape Dental Injury: Teeth and Oropharynx as per pre-operative assessment  Comments: DL X Mac 4 by P. Roberts, Immunologist. Grade 1 view. ATOI. ETT secured at 23 cm at the lip. PCXR pending.

## 2021-03-24 NOTE — Progress Notes (Signed)
Pharmacy Antibiotic Note  [name] is a 60 y.o. male admitted on 03/24/2021 with sepsis.  Pharmacy has been consulted for vanc/cefepime dosing.  Plan: Vanc 2g IV x 1 then 1500mg  IV q12 - goal AUC 400-550 Cefepime 2g IV q8  Height: 6' (182.9 cm) Weight: 94 kg (207 lb 3.7 oz) IBW/kg (Calculated) : 77.6  Temp (24hrs), Avg:95.4 F (35.2 C), Min:92.9 F (33.8 C), Max:97.8 F (36.6 C)  Recent Labs  Lab 03/24/21 0938 03/24/21 1337  WBC 17.3* 26.0*  CREATININE 0.84  --     Estimated Creatinine Clearance: 114.2 mL/min (by C-G formula based on SCr of 0.84 mg/dL).    Allergies  Allergen Reactions   Lyrica [Pregabalin] Palpitations    Chest pain and heart palps.   Neurontin [Gabapentin] Palpitations    Chest pain and heart palp     Thank you for allowing pharmacy to be a part of this patient's care.  Kara Mead 03/24/2021 2:28 PM

## 2021-03-24 NOTE — Progress Notes (Signed)
[facility] Spiritual Care Note  Responded to code blue at [facility], providing pastoral presence and emotional support to [name] through talking with Dr Julien Nordmann at [facility], speaking with [facility] physician and [name], meeting [facility] ICU care team, and [name]'s departure home for self-care.  Brought a hard copy of Mark Klos's Advance Directives from [facility] to [facility] ICU because he brought them in earlier today.  Plan to refer to [facility] for follow-up support. [name] is aware of ongoing [name] availability, as well.   [name], [place], [place] daytime pager [phone] [facility] 24/7 pager [phone]

## 2021-03-24 NOTE — H&P (Addendum)
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 When he was found he was pulseless and CPR was started. ROSC within about 10 min. Intubated by anesthesia during the code. He is waking up in he ED but has required norepinephrine for hypotension despite 1L volume resuscitation. He had a low BP 76/59 prior to his appointment, but has had low BPs at other appointments recently. Per his wife, he has not been eating well due to dysgeusia, but has been drinking plenty of water. He has been compliant with all of his medications. He was not feeling well yesterday and slept most of the day yesterday.  Pertinent  Medical History  CAD s/p STEMI LAD-- DES HFrEF 2/2 ICM NSCLC stage IIIb PAD Tobacco abuse COPD- moderate obstruction in 2019   Significant Hospital Events: Including procedures, antibiotic start and stop dates in addition to other pertinent events   Cardiac arrest in cancer center, intubated, ROSC within about 10 min  Interim History / Subjective:    Objective   Blood pressure 92/67, pulse 83, temperature (!) 92.9 F (33.8 C), resp. rate 17, height 6' (1.829 m), weight 94 kg, SpO2 100 %.    Vent Mode: PRVC FiO2 (%):  [100 %] 100 % Set Rate:  [20 bmp] 20 bmp Vt Set:  [620 mL] 620 mL PEEP:  [5 cmH20] 5 cmH20 Plateau Pressure:  [26 cmH20] 26 cmH20  No intake or output data in the 24 hours ending 03/24/21 1444 Filed Weights   03/24/21 1421  Weight: 94 kg    Examination: General: critically ill appearing man sitting up in stretcher in ED HENT: [place]./AT, eyes open, tracking Lungs: wheezing, high Peak pressures but Pplat only 24. Apneic on  SBT. Cardiovascular: S1S2, distant heart sounds Abdomen: soft, NT Extremities: no c/c/e. BKA RLE. Neuro: awake, alert, pulling against restraints. Following some commands but falls back asleep intermittently. Intact gag reflex. Derm: warm, dry. No rashes.  CXR personally reviewed> chronic RUL scarring with volume loss, ETT, AICD, port in place. No new opacities or pneumothorax.  Resolved Hospital Problem list     Assessment & Plan:  Cardiac arrest- suspect PEA due to hypotension. Has been on GDMT for heart failure with poor PO intake recently. COPD exacerbation is also possible.  -norepinephrine to maintain MAP >65 -holding coreg, spironolactone, lasix, lisinopril -bronchodilators  -interrogate AICD  Shock- suspect medication side effect, but leukocytosis raises concern for septic shock -empiric antibiotics -norepi to maintain MAP >65 -given 1L with resuscitation during code, completed in ED. Giving additional 2L to meet 30cc/kg IBW -lactic acid  Acute respiratory failure with hypoxia requiring MV post-arrest Acute COPD exacerbation -prednisone 40mg  daily -duonebs Q4h -Azithromycin -LTVV, 4-8cc/kg IBW with goal Pplat<30 and DP<15 -daily SAT & SBT -PAD protocol for sedation; fentanyl and PRN versed -VAP prevention protocol.  H/o ICM, chronic HFrEF -echo -holding GDMT due to hypotension -holding diuresis for now  Hyponatremia, appears chronic, likely due to CHF and poor PO intake -serial BMPs -NS bolus for sepsis -holding diuretics  Hyperglycemia, likely reactive due to critical illness -SSI PRN -goal BG <180  Hypothyroidism -checking TSH, free T4  Chronic anemia likely due to critical illness -transfuse for Hb<7 or hemodynamically significant bleeding  Stage IIIb NSCLC -OP treatment; not clear if he received his infusion today or not, but very low suspicion of a reaction to his medication causing his arrest.  Best Practice (right click and "Reselect all  SmartList Selections" daily)   Diet/type: NPO DVT prophylaxis: LMWH GI prophylaxis: PPI Lines: yes and it is still needed-- has port for chemo Foley:  N/A Code Status:  DNR Last date of multidisciplinary goals of care discussion [wife- see separate documentation]  Labs   CBC: Recent Labs  Lab 03/24/21 0938 03/24/21 1337  WBC 17.3* 26.0*  NEUTROABS 14.5* 20.5*  HGB 9.5* 9.4*  HCT 28.8* 30.2*  MCV 80.0 83.4  PLT 318 467*    Basic Metabolic Panel: Recent Labs  Lab 03/24/21 0938  NA 130*  K 4.3  CL 91*  CO2 28  GLUCOSE 139*  BUN 9  CREATININE 0.84  CALCIUM 8.7*   GFR: Estimated Creatinine Clearance: 114.2 mL/min (by C-G formula based on SCr of 0.84 mg/dL). Recent Labs  Lab 03/24/21 0938 03/24/21 1337  WBC 17.3* 26.0*    Liver Function Tests: Recent Labs  Lab 03/24/21 0938  AST 41  ALT 42  ALKPHOS 136*  BILITOT 0.3  PROT 6.6  ALBUMIN 2.6*   No results for input(s): LIPASE, AMYLASE in the last 168 hours. No results for input(s): AMMONIA in the last 168 hours.  ABG    Component Value Date/Time   PHART 7.418 11/23/2007 1400   PCO2ART 43.7 11/23/2007 1400   PO2ART 598.0 (H) 11/23/2007 1400   HCO3 28.4 (H) 11/23/2007 1400   TCO2 24 05/26/2016 0132   O2SAT 100.0 11/23/2007 1400     Coagulation Profile: No results for input(s): INR, PROTIME in the last 168 hours.  Cardiac Enzymes: No results for input(s): CKTOTAL, CKMB, CKMBINDEX, TROPONINI in the last 168 hours.  HbA1C: No results found for: HGBA1C  CBG: No results for input(s): GLUCAP in the last 168 hours.  Review of Systems:   Unable to be obtained due to mental status.   Past Medical History:  He,  has a past medical history of Acute hepatitis C virus infection (06/19/2007), Acute ST elevation myocardial infarction (STEMI) involving left anterior descending (LAD) coronary artery (HCC) (05/26/2016), Acute ST elevation myocardial infarction (STEMI) of lateral wall (HCC) (05/26/2016), AICD  (automatic cardioverter/defibrillator) present (03/02/2018), Anxiety, Asthma, Atherosclerosis of native arteries of the extremities with intermittent claudication (12/16/2011), Chronic hepatitis C without hepatic coma ([place]) (05/03/2014), COPD with chronic bronchitis and emphysema ([place]) (03/13/2018), Depression, DVT (deep venous thrombosis) ([place]) (2009; 2019), Encounter for antineoplastic chemotherapy (03/22/2018), Hepatitis C, High cholesterol, Ischemic cardiomyopathy (03/02/2018), Non-small cell carcinoma of right lung, stage 3 ([place]) (03/22/2018), PAD (peripheral artery disease) ([place]) (01/25/2013), Peripheral vascular disease, unspecified (07/20/2012), Port-A-Cath in place (04/24/2018), ST elevation myocardial infarction involving left anterior descending (LAD) coronary artery ([place]), STEMI (ST elevation myocardial infarction) ([place]) (05/26/2016), Tobacco abuse (01/28/2016), Tubular adenoma of colon (07/11/2014), and Urinary dribbling.   Surgical History:   Past Surgical History:  Procedure Laterality Date   AORTOGRAM  08/08/2009   for LLE claudication     By Dr. 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   BIOPSY N/A 05/30/2014   Procedure: BIOPSY;  Surgeon: [name], MD;  Location: [facility];  Service: Endoscopy;  Laterality: N/A;   BLADDER TUMOR EXCISION  8/812   CARDIAC CATHETERIZATION N/A 05/26/2016   Procedure: Left Heart Cath  and Coronary Angiography;  Surgeon: [name], MD;  Location: [facility];  Service: Cardiovascular;  Laterality: N/A;   CARDIAC CATHETERIZATION N/A 05/26/2016   Procedure: Coronary Stent Intervention;  Surgeon: [name], MD;  Location: [place];  Service: Cardiovascular;  Laterality: N/A;   COLONOSCOPY WITH PROPOFOL N/A 05/30/2014   OEH:OZYYQM colonic polyp-likely source of hematochezia-removed as described above   ESOPHAGOGASTRODUODENOSCOPY (EGD) WITH PROPOFOL N/A 05/30/2014   GNO:IBBCWU and bulbar erosions s/p gastric biopsy. No evidence of portal  gastropathy on today's examination.   FEMORAL-TIBIAL BYPASS GRAFT  2009   Right side using non-reversed GSV   By Dr. Anson [place] BYPASS GRAFT  11/24/2007   Right femoral to anterior tibial BPG   by Dr. Oneida Alar   FINGER SURGERY Left    "straightened my pinky"   HAND TENDON SURGERY Left 2013   Left 5th finger   HERNIA REPAIR     "stomach"   ICD IMPLANT N/A 03/02/2018   Procedure: ICD IMPLANT;  Surgeon: [name], MD;  Location: [facility];  Service: Cardiovascular;  Laterality: N/A;   INCISIONAL HERNIA REPAIR N/A 12/25/2014   Procedure: HERNIA REPAIR INCISIONAL WITH MESH;  Surgeon: [name], MD;  Location: [facility];  Service: General;  Laterality: N/A;   INSERTION OF MESH N/A 12/25/2014   Procedure: INSERTION OF MESH;  Surgeon: [name], MD;  Location: [facility];  Service: General;  Laterality: N/A;   IR IMAGING GUIDED PORT INSERTION  04/11/2018   LAPAROSCOPIC CHOLECYSTECTOMY     LOWER EXTREMITY ANGIOGRAM Left 12/30/2015   Procedure: Lower Extremity Angiogram;  Surgeon: [name], MD;  Location: [facility];  Service: Cardiovascular;  Laterality: Left;   PERIPHERAL VASCULAR CATHETERIZATION N/A 12/30/2015   Procedure: Abdominal Aortogram;  Surgeon: [name], MD;  Location: [facility];  Service: Cardiovascular;  Laterality: N/A;   POLYPECTOMY  05/30/2014   Procedure: POLYPECTOMY;  Surgeon: [name], MD;  Location: [facility];  Service: Endoscopy;;   TONSILLECTOMY AND ADENOIDECTOMY  ~ 1970   TYMPANOSTOMY TUBE PLACEMENT Bilateral ~ 1970     Social History:   reports that he has been smoking cigarettes. He has a 15.50 pack-year smoking history. He has never used smokeless tobacco. He reports current alcohol use of about 18.0 standard drinks of alcohol per week. He reports current drug use. Drug: Marijuana.   Family History:  His family history includes Ulcers in his father; Vision loss in his mother. There is no history of Colon  cancer.   Allergies Allergies  Allergen Reactions   Lyrica [Pregabalin] Palpitations    Chest pain and heart palps.   Neurontin [Gabapentin] Palpitations    Chest pain and heart palp     Home Medications  Prior to Admission medications   Medication Sig Start Date End Date Taking? Authorizing Provider  albuterol (VENTOLIN HFA) 108 (90 Base) MCG/ACT inhaler SMARTSIG:1-2 Puff(s) Via Inhaler Every 4 Hours PRN 06/30/20   [provider]  alprazolam [name]) 2 MG tablet Take 2 mg by mouth 4 (four) times daily as needed for anxiety.    [provider]  amitriptyline (ELAVIL) 100 MG tablet Take 100 mg by mouth at bedtime.     [provider]  aspirin 81 MG EC tablet Take 81 mg by mouth daily.    [provider]  atorvastatin (LIPITOR) 80 MG tablet Take 1 tablet (80 mg total) by mouth daily  at 6 PM. 05/28/16   [name], NP  carvedilol (COREG) 6.25 MG tablet TAKE 1 TABLET BY MOUTH TWICE A DAY WITH A MEAL NEEDS APPT FOR REFILLS 02/18/21   Camnitz, Ocie Doyne, MD  citalopram (CELEXA) 40 MG tablet Take 40 mg by mouth daily. 12/02/14   [provider]  dexamethasone (DECADRON) 4 MG tablet Please take 1 tablet twice a day the day before, the day of, and the day after treatment 02/24/21   Heilingoetter, Cassandra L, PA-C  folic acid (FOLVITE) 1 MG tablet Take 1 tablet (1 mg total) by mouth daily. 02/24/21   Heilingoetter, Cassandra L, PA-C  furosemide (LASIX) 20 MG tablet Take 1 tablet (20 mg total) by mouth daily. Please make overdue appt with Dr. Oval Linsey before anymore refills. 2nd attempt 06/22/19   [name], MD  HYDROcodone-acetaminophen [facility]) 10-325 MG tablet Take 1 tablet by mouth every 4 (four) hours as needed for moderate pain.  06/22/16   [provider]  levothyroxine (SYNTHROID) 125 MCG tablet Take 125 mcg by mouth daily. 12/04/20   [provider]  lidocaine-prilocaine (EMLA) cream Apply 1 application topically as needed.  04/17/18   Maryanna Shape, NP  lisinopril (PRINIVIL,ZESTRIL) 2.5 MG tablet Take 1 tablet (2.5 mg total) by mouth daily. 05/28/16   [name], NP  nitroGLYCERIN (NITROSTAT) 0.4 MG SL tablet Place 1 tablet (0.4 mg total) under the tongue every 5 (five) minutes x 3 doses as needed for chest pain. 05/28/16   [name], NP  prochlorperazine (COMPAZINE) 10 MG tablet Take 1 tablet (10 mg total) by mouth every 6 (six) hours as needed for nausea or vomiting. 02/24/21   Heilingoetter, Cassandra L, PA-C  spironolactone (ALDACTONE) 25 MG tablet TAKE 1 TABLET BY MOUTH EVERY DAY 03/22/19   Camnitz, Ocie Doyne, MD  sucralfate (CARAFATE) 1 g tablet Take 1 tablet (1 g total) by mouth 4 (four) times daily -  with meals and at bedtime. 5 min before meals for radiation induced esophagitis 04/14/18   Tyler Pita, MD  tamsulosin (FLOMAX) 0.4 MG CAPS capsule Take 1 capsule by mouth daily. 01/29/18   [provider]  TRELEGY ELLIPTA 100-62.5-25 MCG/INH AEPB Inhale 1 puff into the lungs daily. 12/13/20   [provider]     Critical care time: 55 min.     Julian Hy, DO 03/24/21 3:02 PM [facility]

## 2021-03-24 NOTE — Progress Notes (Signed)
Discussed [name] care with his wife. She indicated that she had brought advanced directive documents to his appointment today that indicated he would not want life support. These had not yet been reviewed by a physician.  Documents reportedly scanned in, but not available in media section.  His wife wishes to continue aggressive care for now, but knows that he would not want resuscitation again if his heart stops again. D/w chaplain and RN/ [facility] present. Code status updated to DNR. We discussed that when and if we are able to, we will discuss this again with him to confirm his wishes.   Full H&P to follow.  Julian Hy, DO 03/24/21 2:43 PM [facility]

## 2021-03-24 NOTE — Progress Notes (Signed)
  Echocardiogram 2D Echocardiogram with definity has been performed.  Darlina Sicilian M 03/24/2021, 3:47 PM

## 2021-03-25 DIAGNOSIS — I5022 Chronic systolic (congestive) heart failure: Secondary | ICD-10-CM

## 2021-03-25 DIAGNOSIS — D72829 Elevated white blood cell count, unspecified: Secondary | ICD-10-CM

## 2021-03-25 LAB — GLUCOSE, CAPILLARY
Glucose-Capillary: 120 mg/dL — ABNORMAL HIGH (ref 70–99)
Glucose-Capillary: 123 mg/dL — ABNORMAL HIGH (ref 70–99)
Glucose-Capillary: 130 mg/dL — ABNORMAL HIGH (ref 70–99)
Glucose-Capillary: 141 mg/dL — ABNORMAL HIGH (ref 70–99)
Glucose-Capillary: 168 mg/dL — ABNORMAL HIGH (ref 70–99)
Glucose-Capillary: 88 mg/dL (ref 70–99)

## 2021-03-25 LAB — PROCALCITONIN: Procalcitonin: <0.1 ng/mL

## 2021-03-25 LAB — BASIC METABOLIC PANEL
Anion gap: 9 (ref 5–15)
BUN: 11 mg/dL (ref 6–20)
CO2: 28 mmol/L (ref 22–32)
Calcium: 7.9 mg/dL — ABNORMAL LOW (ref 8.9–10.3)
Chloride: 95 mmol/L — ABNORMAL LOW (ref 98–111)
Creatinine, Ser: 0.8 mg/dL (ref 0.61–1.24)
GFR, Estimated: >60 mL/min (ref >60)
Glucose, Bld: 121 mg/dL — ABNORMAL HIGH (ref 70–99)
Potassium: 4.3 mmol/L (ref 3.5–5.1)
Sodium: 132 mmol/L — ABNORMAL LOW (ref 135–145)

## 2021-03-25 LAB — CBC
HCT: 25.4 % — ABNORMAL LOW (ref 39.0–52.0)
Hemoglobin: 8.1 g/dL — ABNORMAL LOW (ref 13.0–17.0)
MCH: 26.5 pg (ref 26.0–34.0)
MCHC: 31.9 g/dL (ref 30.0–36.0)
MCV: 83 fL (ref 80.0–100.0)
Platelets: 226 10*3/uL (ref 150–400)
RBC: 3.06 MIL/uL — ABNORMAL LOW (ref 4.22–5.81)
RDW: 16.6 % — ABNORMAL HIGH (ref 11.5–15.5)
WBC: 17.4 10*3/uL — ABNORMAL HIGH (ref 4.0–10.5)
nRBC: 0 % (ref 0.0–0.2)

## 2021-03-25 LAB — MAGNESIUM: Magnesium: 1.8 mg/dL (ref 1.7–2.4)

## 2021-03-25 LAB — PHOSPHORUS: Phosphorus: 3.9 mg/dL (ref 2.5–4.6)

## 2021-03-25 MED ORDER — CITALOPRAM HYDROBROMIDE 40 MG PO TABS
40.0000 mg | ORAL_TABLET | Freq: Every day | ORAL | Status: DC
  Administered 2021-03-25 – 2021-03-29 (×5): 40 mg via ORAL
  Filled 2021-03-25: qty 2
  Filled 2021-03-25: qty 1
  Filled 2021-03-25 (×2): qty 2
  Filled 2021-03-25: qty 1

## 2021-03-25 MED ORDER — MIDODRINE HCL 5 MG PO TABS
10.0000 mg | ORAL_TABLET | Freq: Three times a day (TID) | ORAL | Status: DC
  Administered 2021-03-25 – 2021-03-29 (×12): 10 mg via ORAL
  Filled 2021-03-25 (×13): qty 2

## 2021-03-25 MED ORDER — PREDNISONE 20 MG PO TABS
40.0000 mg | ORAL_TABLET | Freq: Every day | ORAL | Status: DC
  Administered 2021-03-25 – 2021-03-28 (×4): 40 mg via ORAL
  Filled 2021-03-25 (×4): qty 2

## 2021-03-25 MED ORDER — PROSOURCE PLUS PO LIQD
30.0000 mL | Freq: Two times a day (BID) | ORAL | Status: DC
  Administered 2021-03-25 – 2021-03-29 (×7): 30 mL via ORAL
  Filled 2021-03-25 (×9): qty 30

## 2021-03-25 MED ORDER — IPRATROPIUM-ALBUTEROL 0.5-2.5 (3) MG/3ML IN SOLN
3.0000 mL | Freq: Four times a day (QID) | RESPIRATORY_TRACT | Status: DC | PRN
  Administered 2021-03-26 (×2): 3 mL via RESPIRATORY_TRACT
  Filled 2021-03-25: qty 3

## 2021-03-25 MED ORDER — INSULIN ASPART 100 UNIT/ML IJ SOLN
0.0000 [IU] | Freq: Three times a day (TID) | INTRAMUSCULAR | Status: DC
  Administered 2021-03-25: 2 [IU] via SUBCUTANEOUS
  Administered 2021-03-25: 3 [IU] via SUBCUTANEOUS
  Administered 2021-03-26: 5 [IU] via SUBCUTANEOUS
  Administered 2021-03-27 (×2): 2 [IU] via SUBCUTANEOUS
  Administered 2021-03-28: 3 [IU] via SUBCUTANEOUS
  Administered 2021-03-28 (×2): 2 [IU] via SUBCUTANEOUS
  Administered 2021-03-29: 1 [IU] via SUBCUTANEOUS

## 2021-03-25 MED ORDER — SODIUM CHLORIDE 0.9% FLUSH: 10.0000 mL | INTRAVENOUS | Status: DC | PRN

## 2021-03-25 MED ORDER — ASPIRIN EC 81 MG PO TBEC
81.0000 mg | DELAYED_RELEASE_TABLET | Freq: Every day | ORAL | Status: DC
  Administered 2021-03-25 – 2021-03-29 (×5): 81 mg via ORAL
  Filled 2021-03-25 (×5): qty 1

## 2021-03-25 MED ORDER — ATORVASTATIN CALCIUM 80 MG PO TABS
80.0000 mg | ORAL_TABLET | Freq: Every day | ORAL | Status: DC
  Administered 2021-03-26 – 2021-03-29 (×4): 80 mg via ORAL
  Filled 2021-03-25: qty 1
  Filled 2021-03-25 (×2): qty 2
  Filled 2021-03-25: qty 1
  Filled 2021-03-25: qty 2

## 2021-03-25 MED ORDER — FOLIC ACID 1 MG PO TABS
1.0000 mg | ORAL_TABLET | Freq: Every day | ORAL | Status: DC
  Administered 2021-03-25 – 2021-03-29 (×5): 1 mg via ORAL
  Filled 2021-03-25 (×5): qty 1

## 2021-03-25 MED ORDER — SODIUM CHLORIDE 0.9 % IV BOLUS
250.0000 mL | Freq: Once | INTRAVENOUS | Status: AC
  Administered 2021-03-25: 250 mL via INTRAVENOUS

## 2021-03-25 MED ORDER — ALPRAZOLAM 0.5 MG PO TABS
2.0000 mg | ORAL_TABLET | Freq: Four times a day (QID) | ORAL | Status: DC | PRN
  Administered 2021-03-25 – 2021-03-28 (×4): 2 mg via ORAL
  Filled 2021-03-25: qty 4
  Filled 2021-03-25 (×3): qty 2

## 2021-03-25 MED ORDER — AMITRIPTYLINE HCL 25 MG PO TABS
100.0000 mg | ORAL_TABLET | Freq: Every day | ORAL | Status: DC
  Administered 2021-03-25 – 2021-03-28 (×4): 100 mg via ORAL
  Filled 2021-03-25 (×4): qty 4

## 2021-03-25 MED ORDER — SODIUM CHLORIDE 0.9 % IV BOLUS: 250.0000 mL | Freq: Once | INTRAVENOUS | Status: AC

## 2021-03-25 MED ORDER — ORAL CARE MOUTH RINSE
15.0000 mL | Freq: Two times a day (BID) | OROMUCOSAL | Status: DC
  Administered 2021-03-25 – 2021-03-29 (×8): 15 mL via OROMUCOSAL

## 2021-03-25 MED ORDER — AZITHROMYCIN 500 MG PO TABS
500.0000 mg | ORAL_TABLET | Freq: Every day | ORAL | Status: DC
  Administered 2021-03-25 – 2021-03-28 (×4): 500 mg via ORAL
  Filled 2021-03-25: qty 1
  Filled 2021-03-25 (×3): qty 2
  Filled 2021-03-25: qty 1

## 2021-03-25 MED ORDER — LEVOTHYROXINE SODIUM 25 MCG PO TABS
125.0000 ug | ORAL_TABLET | Freq: Every day | ORAL | Status: DC
  Administered 2021-03-25 – 2021-03-29 (×5): 125 ug via ORAL
  Filled 2021-03-25 (×5): qty 1

## 2021-03-25 MED ORDER — BOOST / RESOURCE BREEZE PO LIQD CUSTOM
1.0000 | Freq: Two times a day (BID) | ORAL | Status: DC
  Administered 2021-03-25 – 2021-03-28 (×7): 1 via ORAL
  Filled 2021-03-25: qty 1

## 2021-03-25 MED ORDER — ADULT MULTIVITAMIN W/MINERALS CH
1.0000 | ORAL_TABLET | Freq: Every day | ORAL | Status: DC
  Administered 2021-03-25 – 2021-03-29 (×5): 1 via ORAL
  Filled 2021-03-25 (×5): qty 1

## 2021-03-25 MED ORDER — IPRATROPIUM-ALBUTEROL 0.5-2.5 (3) MG/3ML IN SOLN
3.0000 mL | Freq: Two times a day (BID) | RESPIRATORY_TRACT | Status: DC
  Administered 2021-03-26 – 2021-03-27 (×3): 3 mL via RESPIRATORY_TRACT
  Filled 2021-03-25 (×4): qty 3

## 2021-03-25 MED ORDER — TAMSULOSIN HCL 0.4 MG PO CAPS
0.4000 mg | ORAL_CAPSULE | Freq: Every day | ORAL | Status: DC
  Administered 2021-03-25 – 2021-03-26 (×2): 0.4 mg via ORAL
  Filled 2021-03-25 (×2): qty 1

## 2021-03-25 NOTE — Progress Notes (Signed)
Chaplain engaged in an initial visit with Event organiser.  Chaplain spent a significant amount of time learning about Daric's life, marriage, parenting and more.  Nickson shared with chaplain about how he first met his wife at a club in [place].  He was enamored by [name] and her two children, "[name]" and Roosevelt.  It was through his marriage and fatherhood that he changed his life around concerning alcohol abuse.  Dewell expressed that he was able to remain sober for 18 years after asking God to help him.  Yamil spoke highly of becoming a parent and how he loves his children and grandchildren.  There have gone through a number of changes and transitions throughout their marriage, but Hamed expressed his joy concerning his family.   Christofer also shared with chaplain about his love for motorcycles and motor bikes.  He was able to share in that love with his son.  They purchased a home with a lot of land that allowed them to roam freely on their bikes.  He enjoyed fast vehicles.  He stated that he would love to get on a bike now.    Sajad spent time speaking about his two parents as well.  His parents are in their 79's now and facing their own health challenges.  It was important that chaplain and Jacori pray for them.  Marcques voiced that he had really great parents that took great care of him.  He talked about their trips to camp and the good times on the road. His parents live in [place] but he has made trips with his wife to visit.  Chaplain offered listening, presence and prayer with Ronen.    03/25/21 1200  Clinical Encounter Type  Visited With Patient  Visit Type Initial  Spiritual Encounters  Spiritual Needs Prayer

## 2021-03-25 NOTE — TOC Initial Note (Signed)
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 When he was found he was pulseless and CPR was started. ROSC within about 10 min. Intubated by anesthesia during the code. He is waking up in he ED but has required norepinephrine for hypotension despite 1L volume resuscitation. He had a low BP 76/59 prior to his appointment, but has had low BPs at other appointments recently. Per his wife, he has not been eating well due to dysgeusia, but has been drinking plenty of water. He has been compliant with all of his medications. He was not feeling well yesterday and slept most of the day yesterday.   Pertinent  Medical History  CAD s/p STEMI LAD-- DES HFrEF 2/2 ICM NSCLC stage IIIb PAD Tobacco abuse COPD- moderate obstruction in 2019     Significant Hospital Events: Including procedures, antibiotic start and stop dates in addition to other pertinent events   Cardiac arrest in cancer center, intubated, ROSC within about 10 min   Interim History / Subjective:      Objective   Blood pressure 92/67, pulse 83, temperature (!) 92.9 F (33.8 C), resp. rate 17, height 6' (1.829 m), weight 94 kg, SpO2 100 %.     >  Vent Mode: PRVC FiO2 (%):  [100 %] 100 % Set Rate:  [20 bmp] 20 bmp Vt Set:  [620 mL] 620 mL PEEP:  [5 cmH20] 5 cmH20 Plateau Pressure:  [26 cmH20] 26 cmH20    No intake or output data in the 24 hours ending 03/24/21 1444    Filed Weights    03/24/21 1421  Weight: 94 kg      Examination: General: critically ill appearing man sitting up in stretcher in ED HENT: [place]./AT, eyes open,  tracking Lungs: wheezing, high Peak pressures but Pplat only 24. Apneic on SBT. Cardiovascular: S1S2, distant heart sounds Abdomen: soft, NT Extremities: no c/c/e. BKA RLE. Neuro: awake, alert, pulling against restraints. Following some commands but falls back asleep intermittently. Intact gag reflex. Derm: warm, dry. No rashes.   CXR personally reviewed> chronic RUL scarring with volume loss, ETT, AICD, port in place. No new opacities or pneumothorax. TOC PLAN OF CARE:: Extubated this am , awake and alert  will follow for toc needs hx of lung ca. Expected Discharge Plan: Home/Self Care Barriers to Discharge: Continued Medical Work up   Patient Goals and CMS Choice Patient states their goals for this hospitalization and ongoing recovery are:: not stated      Expected Discharge Plan and Services Expected Discharge Plan: Home/Self Care   Discharge Planning Services: CM Consult                                          Prior Living Arrangements/Services     Patient language and need for interpreter reviewed:: Yes                 Activities  of Daily Living Home Assistive Devices/Equipment: Other (Comment) (unknown, pt on ventilator and unable to answer) ADL Screening (condition at time of admission) Patient's cognitive ability adequate to safely complete daily activities?: Yes Is the patient deaf or have difficulty hearing?: No Does the patient have difficulty seeing, even when wearing glasses/contacts?: No Does the patient have difficulty concentrating, remembering, or making decisions?: No Patient able to express need for assistance with ADLs?: No (currently on ventilator) Does the patient have difficulty dressing or bathing?: Yes Independently performs ADLs?: No Communication: Needs assistance Is this a change from baseline?: Change from baseline, expected to last <3 days Dressing (OT): Needs assistance Is this a change from baseline?: Change from baseline,  expected to last >3 days Grooming: Needs assistance Is this a change from baseline?: Change from baseline, expected to last <3 days Feeding: Needs assistance Is this a change from baseline?: Change from baseline, expected to last <3 days Bathing: Needs assistance Is this a change from baseline?: Change from baseline, expected to last <3 days Toileting: Needs assistance Is this a change from baseline?: Change from baseline, expected to last <3 days In/Out Bed: Needs assistance Is this a change from baseline?: Change from baseline, expected to last <3 days Walks in Home: Needs assistance Is this a change from baseline?: Change from baseline, expected to last <3 days Does the patient have difficulty walking or climbing stairs?: Yes Weakness of Legs: Both Weakness of Arms/Hands: Both  Permission Sought/Granted                  Emotional Assessment Appearance:: Appears stated age Attitude/Demeanor/Rapport: Unable to Assess Affect (typically observed): Unable to Assess Orientation: : Fluctuating Orientation (Suspected and/or reported Sundowners) Alcohol / Substance Use: Tobacco Use Psych Involvement: No (comment)  Admission diagnosis:  Cardiac arrest [facility]) [I46.9] Patient Active Problem List   Diagnosis Date Noted   Hypotension 03/24/2021   Cardiac arrest ([place]) 03/24/2021   Recurrent non-small cell lung cancer (NSCLC) ([place]) 12/22/2020   Seizures ([place]) 06/30/2018   Seizure-like activity ([place]) 06/26/2018   Encounter for antineoplastic immunotherapy 06/12/2018   Port-A-Cath in place 04/24/2018   Non-small cell carcinoma of right lung, stage 3 ([place]) 03/22/2018   Goals of care, counseling/discussion 03/22/2018   Encounter for antineoplastic chemotherapy 03/22/2018   COPD with chronic bronchitis and emphysema ([place]) 03/13/2018   Mass of upper lobe of right lung 03/08/2018   Ischemic cardiomyopathy 03/02/2018   Medication management 07/09/2016   Acute ST elevation myocardial  infarction (STEMI) of lateral wall (HCC) 05/26/2016   Acute ST elevation myocardial infarction (STEMI) involving left anterior descending (LAD) coronary artery ([place]) 05/26/2016   ST elevation myocardial infarction involving left anterior descending (LAD) coronary artery ([place])    Tobacco abuse 01/28/2016   Tubular adenoma of colon 07/11/2014   Chronic hepatitis C without hepatic coma ([place]) 05/03/2014   Encounter for screening colonoscopy 05/03/2014   PAD (peripheral artery disease) ([place]) 01/25/2013   Peripheral vascular disease, unspecified 07/20/2012   Atherosclerosis of native arteries of the extremities with intermittent claudication 12/16/2011   Acute hepatitis C virus infection 06/19/2007   SEBORRHEIC KERATOSIS 06/13/2007   BLOOD CHEMISTRY, ABNORMAL 06/13/2007   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   ANXIETY DISORDER, GENERALIZED 05/30/2007   LEG PAIN, RIGHT 11/26/2003   PCP:  Redmond School, MD Pharmacy:   [id number] - [place], [place] - [address] [place] [address] Phone: [phone] Fax: [phone]     Social  Determinants of Health (SDOH) Interventions    Readmission Risk Interventions No flowsheet data found.

## 2021-03-25 NOTE — Progress Notes (Signed)
Pt BP dropping. BP 79/45 MAP 56. MD notified. Orders to give 250cc Bolus

## 2021-03-25 NOTE — Progress Notes (Signed)
Initial Nutrition Assessment  DOCUMENTATION CODES:   Not applicable  INTERVENTION:  - will order Boost Breeze BID, each supplement provides 250 kcal and 9 grams of protein.  - will order 30 ml Prosource Plus BID, each supplement provides 100 kcal and 15 grams protein.  - will order 1 tablet multivitamin with minerals/day. - diet advancement as medically feasible. - complete NFPE when feasible.    NUTRITION DIAGNOSIS:   Increased nutrient needs related to acute illness, chronic illness, cancer and cancer related treatments as evidenced by estimated needs.  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 When he was found he was pulseless and CPR was started. ROSC within about 10 min. Intubated by anesthesia during the code. He is waking up in he ED but has required norepinephrine for hypotension despite 1L volume resuscitation. He had a low BP 76/59 prior to his appointment, but has had low BPs at other appointments recently. Per his wife, he has not been eating well due to dysgeusia, but has been drinking plenty of water. He has been compliant with all of his medications. He was not feeling well yesterday and slept most of the day yesterday.  He was intubated yesterday late afternoon and extubated a short time later. Diet advanced to CLD yesterday at 2120. No intakes documented since that time. Patient had breakfast tray on bedside table and had consumed ~75% of of it. He was not feeling ready for lunch yet.  It was difficult to obtain nutrition-related information as patient and his wife often talked over each other about differing topics.    Patient does report that he greatly enjoys fruit and that he drinks large quantities of water each day. Beyond this, unable to determine anything about PO intakes PTA.   Weight yesterday was 207 lb and weight on 3/28 was 215 lb. This indicates 8 lb weight loss (3.7% body weight) in the past 3 months; not significant for time frame.   Per notes: - cardiac arrest, PEA suspected - shock thought to be 2/2 medication side effect or d/t septic shock - hyponatremia--likely chronic - stage 3 NSCLC on chemo   Labs reviewed; CBGs: 123, 130, 141 mg/dl, Na: 132 mmol/l, Cl: 95 mmol/l, Ca: 7.9 mg/dl. Medications reviewed; 1 mg folvite/day, sliding scale novolog, 40 mg deltasone/day.    NUTRITION - FOCUSED PHYSICAL EXAM:  Unable to complete at this time.   Diet Order:   Diet Order             Diet clear liquid Room service appropriate? Yes; Fluid consistency: Thin  Diet effective now                   EDUCATION NEEDS:   Not appropriate for education at this time  Skin:  Skin Assessment: Reviewed RN Assessment  Last BM:  PTA/unknown  Height:   Ht Readings from Last 1 Encounters:  03/24/21 6' (1.829 m)    Weight:   Wt Readings from Last 1 Encounters:  03/24/21 94 kg      Estimated Nutritional Needs:  Kcal:  2300-2500 kcal Protein:  115-125 grams Fluid:  >/=  2.3 L/day       Jarome Matin, MS, RD, LDN, CNSC Inpatient Clinical Dietitian RD pager # available in AMION  After hours/weekend pager # available in [facility]

## 2021-03-25 NOTE — Progress Notes (Signed)
Rt found patient on room air and tolerating well at this time with no distress. Nasal cannula was left at the head of the bed for patient use if needed. Saturation on room air 97%

## 2021-03-25 NOTE — Progress Notes (Signed)
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 Received 58mins of ACLS before ROCS was achieved   Pertinent  Medical History  CAD s/p STEMI LAD-- DES HFrEF 2/2 ICM NSCLC stage IIIb PAD Tobacco abuse COPD- moderate obstruction in 2019  [place] [facility] Events:  6/28 Cardiac arrest in cancer center, intubated, ROSC within about 10 min 6/29 Extubated afternoon of 6/28, weaned off pressors overnight   Interim History / Subjective:  No acute events overnight Pressors weaned off with mild hypotension seen this morning States he feels well with no acute complaints  Objective   Blood pressure 99/62, pulse 61, temperature 97.6 F (36.4 C), temperature source Oral, resp. rate (!) 0, height 6' (1.829 m), weight 94 kg, SpO2 100 %.    Vent Mode: PRVC FiO2 (%):  [60 %-100 %] 60 % Set Rate:  [20 bmp] 20 bmp Vt Set:  [500 mL-620 mL] 500 mL PEEP:  [5 cmH20] 5 cmH20 Plateau Pressure:  [26 cmH20] 26 cmH20   Intake/Output Summary (Last 24 hours) at 03/25/2021 0734 Last data filed at 03/25/2021 0302 Gross per 24 hour  Intake 1017.21 ml  Output 0 ml  Net 1017.21 ml   Filed Weights   03/24/21 1421  Weight: 94 kg    Examination: General: Acute on chronic thin mildly deconditioned middle-aged male lying in bed in no acute distress HEENT: [place]/AT, MM pink/moist, PERRL,  Neuro: Alert and oriented x3, nonfocal CV: s1s2 regular rate and rhythm, no murmur, rubs, or gallops,  PULM: Faint bilateral upper airway expiratory wheeze, oxygen saturations 97 on 2 L nasal cannula, no increased work of breathing GI: soft, bowel sounds active in all 4 quadrants, non-tender,  non-distended, tolerating oral diet Extremities: warm/dry, no edema  Skin: no rashes or lesions  Resolved Hospital Problem list     Assessment & Plan:  Cardiac arrest - suspect PEA due to hypotension. Has been on GDMT for heart failure with poor PO intake recently. COPD exacerbation is also possible.  H/o ICM, chronic HFrEF -Echo 6/28 EF estimated 25 to 30%, poor images even with Definity but apical segments are seen with severe hypokinesis, grade 2 diastolic dysfunction P: Continuous telemetry Strict intake and output Monitor volume status Optimize electrolytes Unable to utilize GDMT due to hypotension Daily assessment for need of diuretics Pressor support weaned off 6/28 AICD in place  Shock - suspect medication side effect, but leukocytosis raises concern for septic shock -Respiratory culture with few gram-positive cocci in chains and gram-positive rods, final result pending P: Remains on empiric cefepime, vancomycin, and azithromycin Follow CBC and fever curve Hold on further IV hydration Supportive care  Acute respiratory failure with hypoxia requiring MV post-arrest Acute COPD exacerbation Current everyday smoker P: Continue p.o. prednisone 40 mg x 5 days Scheduled DuoNebs Antibiotics as above Encourage pulmonary hygiene Elevate head of bed Mobilize as able Tobacco cessation education when appropriate  Hyponatremia, appears chronic, likely due to CHF and poor PO intake P: Sodium remained stable at 132 Trend to bmet Diuretics remain on hold  Hyperglycemia, likely reactive due to critical illness P: SSI CBG every 4 CBG goal 140-180  Hypothyroidism P: TSH  11.22, free T4 1.18 Continue home Synthroid  Chronic anemia likely due to critical illness P: Transfuse per protocol Hemoglobin goal greater than 7 Monitor for signs of bleeding  Stage IIIb NSCLC -OP treatment; not clear if he received his infusion today or not, but very low suspicion of a  reaction to his medication causing his arrest.  Best Practice   Diet/type: NPO DVT prophylaxis: LMWH GI prophylaxis: PPI Lines: yes and it is still needed-- has port for chemo Foley:  N/A Code Status:  DNR Last date of multidisciplinary goals of care discussion [wife- see separate documentation]  Critical care time: N/A   Clema Skousen D. Kenton Kingfisher, NP-C  Pulmonary & Critical Care Personal contact information can be found on Amion  03/25/2021, 7:40 AM

## 2021-03-25 NOTE — Progress Notes (Signed)
Called report to 4E. Pt's belongings packed. Pt brought up to 4E by charge nurse & tech. Pt stable.

## 2021-03-26 DIAGNOSIS — I10 Essential (primary) hypertension: Secondary | ICD-10-CM | POA: Diagnosis not present

## 2021-03-26 DIAGNOSIS — J449 Chronic obstructive pulmonary disease, unspecified: Secondary | ICD-10-CM | POA: Diagnosis not present

## 2021-03-26 DIAGNOSIS — E7849 Other hyperlipidemia: Secondary | ICD-10-CM | POA: Diagnosis not present

## 2021-03-26 LAB — CBC
HCT: 26.1 % — ABNORMAL LOW (ref 39.0–52.0)
Hemoglobin: 8.3 g/dL — ABNORMAL LOW (ref 13.0–17.0)
MCH: 26.5 pg (ref 26.0–34.0)
MCHC: 31.8 g/dL (ref 30.0–36.0)
MCV: 83.4 fL (ref 80.0–100.0)
Platelets: 242 10*3/uL (ref 150–400)
RBC: 3.13 MIL/uL — ABNORMAL LOW (ref 4.22–5.81)
RDW: 16.8 % — ABNORMAL HIGH (ref 11.5–15.5)
WBC: 13.1 10*3/uL — ABNORMAL HIGH (ref 4.0–10.5)
nRBC: 0 % (ref 0.0–0.2)

## 2021-03-26 LAB — GLUCOSE, CAPILLARY
Glucose-Capillary: 108 mg/dL — ABNORMAL HIGH (ref 70–99)
Glucose-Capillary: 201 mg/dL — ABNORMAL HIGH (ref 70–99)
Glucose-Capillary: 110 mg/dL — ABNORMAL HIGH (ref 70–99)
Glucose-Capillary: 137 mg/dL — ABNORMAL HIGH (ref 70–99)

## 2021-03-26 LAB — BASIC METABOLIC PANEL
Anion gap: 6 (ref 5–15)
BUN: 17 mg/dL (ref 6–20)
CO2: 28 mmol/L (ref 22–32)
Calcium: 8.1 mg/dL — ABNORMAL LOW (ref 8.9–10.3)
Chloride: 97 mmol/L — ABNORMAL LOW (ref 98–111)
Creatinine, Ser: 0.84 mg/dL (ref 0.61–1.24)
GFR, Estimated: >60 mL/min (ref >60)
Glucose, Bld: 117 mg/dL — ABNORMAL HIGH (ref 70–99)
Potassium: 4 mmol/L (ref 3.5–5.1)
Sodium: 131 mmol/L — ABNORMAL LOW (ref 135–145)

## 2021-03-26 LAB — PROCALCITONIN: Procalcitonin: <0.1 ng/mL

## 2021-03-26 NOTE — Progress Notes (Signed)
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 When he was found, he was pulseless and CPR was started. ROSC within about 10 min. Intubated by anesthesia during the code. He is waking up in he ED but has required norepinephrine for hypotension despite 1L volume resuscitation. He had a low BP 76/59 prior to his appointment, but has had low BPs at other appointments recently. Per his wife, he has not been eating well due to dysgeusia, but has been drinking plenty of water. He has been compliant with all of his medications. He was not feeling well yesterday and slept most of the day yesterday.  Patient was admitted to the ICU on 6/28 after cardiac arrest, suspected PEA due to hypotension.  Patient had been on cardiac medications for his underlying ICM.  New events last 24 hours / Subjective: Patient feeling well this morning.  He has no complaints other than some right-sided chest pain that worsens with cough.  Wants to go home.  Assessment & Plan:   Active Problems:   Cardiac arrest ([place])   Status post PEA arrest, suspected due to hypotension with heart failure medications combined with poor dietary intake -Now extubated and off pressors -Continue midodrine -PT pending -Check orthostatic VS tomorrow   COPD exacerbation -Continue prednisone, azithromycin  History of ICM, chronic systolic heart failure -Continue to hold Coreg, spironolactone, Lasix, lisinopril  Hypothyroidism -Continue Synthroid  Stage IIIb non-small cell lung cancer -Followed by Dr. Julien Nordmann  Sepsis ruled out -Presented with WBC 29, HR 72, RR 17, temperature 97.8 -Procalcitonin remains negative, lactic acid 1.9 -Blood cultures negative to date -Respiratory  culture with normal respiratory flora -No source of infection found  Hyperlipidemia -Continue Lipitor  DVT prophylaxis:  enoxaparin (LOVENOX) injection 40 mg Start: 03/24/21 2200 SCDs Start: 03/24/21 1400  Code Status:     Code Status Orders  (From admission, onward)           Start     Ordered   03/24/21 1445  Do not attempt resuscitation (DNR)  Continuous       Question Answer Comment  In the event of cardiac or respiratory ARREST Do not call a "code blue"   In the event of cardiac or respiratory ARREST Do not perform Intubation, CPR, defibrillation or ACLS   In the event of cardiac or respiratory ARREST Use medication by any route, position, wound care, and other measures to relive pain and suffering. May use oxygen, suction and manual treatment of airway obstruction as needed for comfort.      03/24/21 1444           Code Status History     Date Active Date Inactive Code Status Order ID Comments User Context   03/24/2021 1403 03/24/2021 1444 Full Code [id number]  Julian Hy, DO ED   03/02/2018 1039 03/03/2018 1950 Full Code [id number]  [name], MD Inpatient   05/26/2016 0344 05/28/2016 1828 Full Code [id number]  [name], MD Inpatient   05/26/2016 0344 05/26/2016 0344 Full Code [id number]  Corotto, Sammuel Hines, MD Inpatient      Advance Directive Documentation    Flowsheet Row Most Recent Value  Type of Advance Directive  Living will  [per computer notes]  Pre-existing out of facility DNR order (yellow form or pink MOST form) --  "MOST" Form in Place? --      Family Communication: No family at bedside Disposition Plan:  Status is: Inpatient  Remains inpatient appropriate because:Hemodynamically unstable  Dispo: The patient is from: Home              Anticipated d/c is to: Home              Patient currently is not medically stable to d/c.   Difficult to place patient No      Antimicrobials:  Anti-infectives (From admission, onward)     Start     Dose/Rate Route Frequency Ordered Stop   03/25/21 1800  azithromycin (ZITHROMAX) tablet 500 mg        500 mg Oral Daily-1800 03/25/21 0814     03/25/21 0400  vancomycin (VANCOREADY) IVPB 1500 mg/300 mL  Status:  Discontinued        1,500 mg 150 mL/hr over 120 Minutes Intravenous Every 12 hours 03/24/21 1430 03/25/21 0805   03/25/21 0000  ceFEPIme (MAXIPIME) 2 g in sodium chloride 0.9 % 100 mL IVPB        2 g 200 mL/hr over 30 Minutes Intravenous Every 8 hours 03/24/21 1430     03/24/21 1800  azithromycin (ZITHROMAX) 500 mg in sodium chloride 0.9 % 250 mL IVPB  Status:  Discontinued        500 mg 250 mL/hr over 60 Minutes Intravenous Every 24 hours 03/24/21 1520 03/25/21 0814   03/24/21 1445  vancomycin (VANCOREADY) IVPB 2000 mg/400 mL        2,000 mg 200 mL/hr over 120 Minutes Intravenous  Once 03/24/21 1430 03/24/21 1939   03/24/21 1445  ceFEPIme (MAXIPIME) 2 g in sodium chloride 0.9 % 100 mL IVPB        2 g 200 mL/hr over 30 Minutes Intravenous  Once 03/24/21 1430 03/24/21 1705        Objective: Vitals:   03/26/21 0004 03/26/21 0319 03/26/21 1449 03/26/21 1527  BP: 90/60 102/69 (!) 89/62 98/68  Pulse:  68 81 79  Resp:  18    Temp:  97.7 F (36.5 C) (!) 97.5 F (36.4 C)   TempSrc:   Oral   SpO2:  98% 91%   Weight:      Height:        Intake/Output Summary (Last 24 hours) at 03/26/2021 1530 Last data filed at 03/26/2021 0619 Gross per 24 hour  Intake 426.04 ml  Output 650 ml  Net -223.96 ml   Filed Weights   03/24/21 1421  Weight: 94 kg    Examination:  General exam: Appears calm and comfortable  Respiratory system: Clear to auscultation. Respiratory effort normal. No respiratory distress. No conversational dyspnea. On room air  Cardiovascular system: S1 & S2 heard, RRR. No murmurs. No pedal edema. Gastrointestinal system: Abdomen is nondistended, soft and nontender. Normal bowel sounds heard. Central nervous system: Alert and oriented. No focal  neurological deficits. Speech clear.  Extremities: Right BKA  Psychiatry: Judgement and insight appear normal. Mood & affect appropriate.   Data Reviewed: I have personally reviewed following labs and imaging studies  CBC: Recent Labs  Lab 03/24/21 0938 03/24/21 1337 03/24/21 1612 03/25/21 0648 03/26/21 0431  WBC 17.3* 26.0* 29.0* 17.4* 13.1*  NEUTROABS 14.5* 20.5*  --   --   --   HGB 9.5* 9.4* 9.1* 8.1* 8.3*  HCT 28.8* 30.2* 28.6* 25.4* 26.1*  MCV 80.0 83.4 82.7 83.0 83.4  PLT 318 467* 306 226 283   Basic Metabolic Panel: Recent Labs  Lab 03/24/21 0938 03/24/21 1337 03/24/21 1612 03/25/21 0648 03/26/21 0431  NA 130* 131*  --  132* 131*  K 4.3 4.0  --  4.3 4.0  CL 91* 96*  --  95* 97*  CO2 28 27  --  28 28  GLUCOSE 139* 241*  --  121* 117*  BUN 9 10  --  11 17  CREATININE 0.84 0.91 0.89 0.80 0.84  CALCIUM 8.7* 7.8*  --  7.9* 8.1*  MG  --  2.1  --  1.8  --   PHOS  --   --   --  3.9  --    GFR: Estimated Creatinine Clearance: 114.2 mL/min (by C-G formula based on SCr of 0.84 mg/dL). Liver Function Tests: Recent Labs  Lab 03/24/21 0938 03/24/21 1337  AST 41 43*  ALT 42 42  ALKPHOS 136* 121  BILITOT 0.3 0.7  PROT 6.6 6.2*  ALBUMIN 2.6* 2.8*   No results for input(s): LIPASE, AMYLASE in the last 168 hours. No results for input(s): AMMONIA in the last 168 hours. Coagulation Profile: Recent Labs  Lab 03/24/21 1611  INR 1.0   Cardiac Enzymes: No results for input(s): CKTOTAL, CKMB, CKMBINDEX, TROPONINI in the last 168 hours. BNP (last 3 results) No results for input(s): PROBNP in the last 8760 hours. HbA1C: No results for input(s): HGBA1C in the last 72 hours. CBG: Recent Labs  Lab 03/25/21 1529 03/25/21 2039 03/25/21 2343 03/26/21 0706 03/26/21 1124  GLUCAP 168* 88 120* 108* 201*   Lipid Profile: No results for input(s): CHOL, HDL, LDLCALC, TRIG, CHOLHDL, LDLDIRECT in the last 72 hours. Thyroid Function Tests: Recent Labs    03/24/21 1611  03/24/21 1613  TSH 11.224*  --   FREET4  --  1.18*   Anemia Panel: No results for input(s): VITAMINB12, FOLATE, FERRITIN, TIBC, IRON, RETICCTPCT in the last 72 hours. Sepsis Labs: Recent Labs  Lab 03/24/21 1611 03/24/21 1612 03/25/21 0648 03/26/21 0431  PROCALCITON  --  <0.10 <0.10 <0.10  LATICACIDVEN 1.9  --   --   --     Recent Results (from the past 240 hour(s))  Resp Panel by RT-PCR (Flu A&B, Covid) Nasopharyngeal Swab     Status: None   Collection Time: 03/24/21  2:17 PM   Specimen: Nasopharyngeal Swab; Nasopharyngeal(NP) swabs in vial transport medium  Result Value Ref Range Status   SARS Coronavirus 2 by RT PCR NEGATIVE NEGATIVE Final    Comment: (NOTE) SARS-CoV-2 target nucleic acids are NOT DETECTED.  The SARS-CoV-2 RNA is generally detectable in upper respiratory specimens during the acute phase of infection. The lowest concentration of SARS-CoV-2 viral copies this assay can detect is 138 copies/mL. A negative result does not preclude SARS-Cov-2 infection and should not be used as the sole basis for treatment or other patient management decisions. A negative result may occur with  improper specimen collection/handling, submission of specimen other than nasopharyngeal swab, presence of viral mutation(s) within the areas targeted by this assay, and inadequate number of viral copies(<138 copies/mL). A negative result must be combined with clinical observations, patient history, and epidemiological information. The expected result is Negative.  Fact Sheet for Patients:  EntrepreneurPulse.com.au  Fact Sheet for Healthcare Providers:  IncredibleEmployment.be  This test is no t yet approved or cleared by the [place] FDA and  has  been authorized for detection and/or diagnosis of SARS-CoV-2 by FDA under an Emergency Use Authorization (EUA). This EUA will remain  in effect (meaning this test can be used) for the duration of  the COVID-19 declaration under Section 564(b)(1) of the Act, 21 U.S.C.section 360bbb-3(b)(1), unless the authorization is terminated  or revoked sooner.       Influenza A by PCR NEGATIVE NEGATIVE Final   Influenza B by PCR NEGATIVE NEGATIVE Final    Comment: (NOTE) The Xpert Xpress SARS-CoV-2/FLU/RSV plus assay is intended as an aid in the diagnosis of influenza from Nasopharyngeal swab specimens and should not be used as a sole basis for treatment. Nasal washings and aspirates are unacceptable for Xpert Xpress SARS-CoV-2/FLU/RSV testing.  Fact Sheet for Patients: EntrepreneurPulse.com.au  Fact Sheet for Healthcare Providers: IncredibleEmployment.be  This test is not yet approved or cleared by the [place] FDA and has been authorized for detection and/or diagnosis of SARS-CoV-2 by FDA under an Emergency Use Authorization (EUA). This EUA will remain in effect (meaning this test can be used) for the duration of the COVID-19 declaration under Section 564(b)(1) of the Act, 21 U.S.C. section 360bbb-3(b)(1), unless the authorization is terminated or revoked.  Performed at [facility], [place] [address], [place], [place] [address]   MRSA PCR Screening     Status: None   Collection Time: 03/24/21  3:18 PM  Result Value Ref Range Status   MRSA by PCR NEGATIVE NEGATIVE Final    Comment:        The GeneXpert MRSA Assay (FDA approved for NASAL specimens only), is one component of a comprehensive MRSA colonization surveillance program. It is not intended to diagnose MRSA infection nor to guide or monitor treatment for MRSA infections. Performed at [facility], [place] [address], [place], [place] [address]   Culture, Respiratory w Gram Stain     Status: None (Preliminary result)   Collection Time: 03/24/21  3:55 PM   Specimen: Tracheal Aspirate; Respiratory  Result Value Ref Range Status   Specimen  Description   Final    TRACHEAL ASPIRATE Performed at [place] [address], [place], [place] [address]    Special Requests   Final    NONE Performed at [facility], [place] [address] [place], [place] [address]    Gram Stain   Final    MODERATE WBC PRESENT,BOTH PMN AND MONONUCLEAR FEW GRAM POSITIVE COCCI IN CHAINS FEW GRAM POSITIVE RODS    Culture   Final    Normal respiratory flora-no Staph aureus or Pseudomonas seen Performed at [facility], [address] [place], [place] [address]    Report Status PENDING  Incomplete  Culture, blood (routine x 2)     Status: None (Preliminary result)   Collection Time: 03/24/21  4:09 PM   Specimen: BLOOD  Result Value Ref Range Status   Specimen Description   Final    BLOOD RIGHT ANTECUBITAL Performed at [place] [address] [place], [place] [address]    Special Requests   Final    BOTTLES DRAWN AEROBIC ONLY Blood Culture adequate volume Performed at [place] [address] [place], [place] [address]    Culture   Final    NO GROWTH 2 DAYS Performed at [place] [address] [place], [place] [address]    Report Status PENDING  Incomplete  Culture, blood (routine x 2)  Status: None (Preliminary result)   Collection Time: 03/24/21  4:10 PM   Specimen: BLOOD  Result Value Ref Range Status   Specimen Description   Final    BLOOD LEFT ANTECUBITAL Performed at [place] [address] [place], [place] [address]    Special Requests   Final    BOTTLES DRAWN AEROBIC ONLY Blood Culture results may not be optimal due to an inadequate volume of blood received in culture bottles Performed at [place] [address], [place], [place] [address]    Culture   Final    NO GROWTH 2 DAYS Performed at [place] [address], [place], [place] [address]    Report Status  PENDING  Incomplete      Radiology Studies: ECHOCARDIOGRAM COMPLETE  Result Date: 03/24/2021    ECHOCARDIOGRAM REPORT   Patient Name:   DURWOOD DITTUS Date of Exam: 03/24/2021 Medical Rec #:  [id number]   Height:       72.0 in Accession #:    [id number]  Weight:       207.2 lb Date of Birth:  01/10/1961   BSA:          2.163 m Patient Age:    32 years    BP:           89/70 mmHg Patient Gender: M           HR:           79 bpm. Exam Location:  Inpatient Procedure: 2D Echo, Cardiac Doppler, Color Doppler and Intracardiac            Opacification Agent Indications:    [facility]) [[id number]]  History:        Patient has prior history of Echocardiogram examinations, most                 recent 02/07/2018. CHF, CAD and Previous Myocardial Infarction,                 Defibrillator, PAD and COPD; Risk Factors:Current Smoker.                 Cardiac arrest in cancer center. Cancer.  Sonographer:    Darlina Sicilian RDCS Referring Phys: [id number] [facility]  Sonographer Comments: Technically challenging study due to limited acoustic windows, suboptimal apical window, suboptimal parasternal window and suboptimal subcostal window. IMPRESSIONS  1. Left ventricular ejection fraction, by estimation, is 25-30%. The left ventricle has severely decreased function. Left ventricular endocardial border not optimally defined to evaluate regional wall motion (even with Definity contrast), but the apical  segments are severely hypokinetic/akinetic. Left ventricular diastolic parameters are consistent with Grade II diastolic dysfunction (pseudonormalization).  2. Right ventricular systolic function is normal. The right ventricular size is normal. Tricuspid regurgitation signal is inadequate for assessing PA pressure.  3. A small pericardial effusion is present. The pericardial effusion is anterior to the right ventricle. There is no evidence of cardiac tamponade.  4. The mitral valve is grossly normal. No evidence of mitral valve  regurgitation.  5. The aortic valve is normal in structure. Aortic valve regurgitation is not visualized. Comparison(s): No significant change from prior study. Prior images reviewed side by side. Today's study is of poor technical quality FINDINGS  Left Ventricle: Left ventricular ejection fraction, by estimation, is 25 to 30%. The left ventricle has severely decreased function. Left ventricular endocardial border not optimally defined to evaluate regional wall motion. Definity  contrast agent was given IV to delineate the left ventricular endocardial borders. The left ventricular internal cavity size was normal in size. There is no left ventricular hypertrophy. Left ventricular diastolic parameters are consistent with Grade II diastolic dysfunction (pseudonormalization). Elevated left atrial pressure. Right Ventricle: The right ventricular size is normal. Right vetricular wall thickness was not well visualized. Right ventricular systolic function is normal. Tricuspid regurgitation signal is inadequate for assessing PA pressure. Left Atrium: Left atrial size was not assessed. Right Atrium: Right atrial size was not assessed. Pericardium: A small pericardial effusion is present. The pericardial effusion is anterior to the right ventricle. There is no evidence of cardiac tamponade. Mitral Valve: The mitral valve is grossly normal. No evidence of mitral valve regurgitation. Tricuspid Valve: The tricuspid valve is not well visualized. Tricuspid valve regurgitation is not demonstrated. Aortic Valve: The aortic valve is normal in structure. Aortic valve regurgitation is not visualized. Pulmonic Valve: The pulmonic valve was not well visualized. Pulmonic valve regurgitation is not visualized. Aorta: The aortic root was not well visualized. IAS/Shunts: The interatrial septum was not assessed. Additional Comments: A device lead is visualized in the right ventricle.  LEFT VENTRICLE PLAX 2D LVOT diam:     2.10 cm       Diastology LVOT Area:     3.46 cm     LV e' medial:    5.65 cm/s                             LV E/e' medial:  8.6                             LV e' lateral:   5.34 cm/s LV Volumes (MOD)            LV E/e' lateral: 9.1 LV vol d, MOD A2C: 113.0 ml LV vol d, MOD A4C: 108.0 ml LV vol s, MOD A2C: 59.7 ml LV vol s, MOD A4C: 56.8 ml LV SV MOD A2C:     53.3 ml LV SV MOD A4C:     108.0 ml LV SV MOD BP:      52.6 ml  AORTA Ao Root diam: 3.40 cm MITRAL VALVE MV Area (PHT): 2.75 cm    SHUNTS MV Decel Time: 276 msec    Systemic Diam: 2.10 cm MV E velocity: 48.50 cm/s MV A velocity: 50.90 cm/s MV E/A ratio:  0.95 Mihai Croitoru MD Electronically signed by Sanda Klein MD Signature Date/Time: 03/24/2021/5:22:24 PM    Final       Scheduled Meds:  (feeding supplement) PROSource Plus  30 mL Oral BID BM   amitriptyline  100 mg Oral QHS   aspirin EC  81 mg Oral Daily   atorvastatin  80 mg Oral Daily   azithromycin  500 mg Oral q1800   Chlorhexidine Gluconate Cloth  6 each Topical Daily   citalopram  40 mg Oral Daily   enoxaparin (LOVENOX) injection  40 mg Subcutaneous Q24H   feeding supplement  1 Container Oral BID BM   folic acid  1 mg Oral Daily   insulin aspart  0-15 Units Subcutaneous TID WC   ipratropium-albuterol  3 mL Nebulization BID   levothyroxine  125 mcg Oral Q0600   mouth rinse  15 mL Mouth Rinse BID   midodrine  10 mg Oral TID WC   multivitamin with minerals  1 tablet Oral Daily  predniSONE  40 mg Oral Q breakfast   tamsulosin  0.4 mg Oral Daily   Continuous Infusions:  ceFEPime (MAXIPIME) IV 2 g (03/26/21 0905)     LOS: 2 days      Time spent: 35 minutes   Dessa Phi, DO [facility] 03/26/2021, 3:30 PM   Available via Epic secure chat 7am-7pm After these hours, please refer to coverage provider listed on [email or web address]

## 2021-03-27 ENCOUNTER — Inpatient Hospital Stay (HOSPITAL_COMMUNITY): Payer: Medicare Other

## 2021-03-27 DIAGNOSIS — E039 Hypothyroidism, unspecified: Secondary | ICD-10-CM

## 2021-03-27 LAB — GLUCOSE, CAPILLARY
Glucose-Capillary: 113 mg/dL — ABNORMAL HIGH (ref 70–99)
Glucose-Capillary: 142 mg/dL — ABNORMAL HIGH (ref 70–99)
Glucose-Capillary: 159 mg/dL — ABNORMAL HIGH (ref 70–99)
Glucose-Capillary: 141 mg/dL — ABNORMAL HIGH (ref 70–99)
Glucose-Capillary: 150 mg/dL — ABNORMAL HIGH (ref 70–99)

## 2021-03-27 LAB — CULTURE, RESPIRATORY W GRAM STAIN: Culture: NORMAL

## 2021-03-27 LAB — CBC
HCT: 27.6 % — ABNORMAL LOW (ref 39.0–52.0)
Hemoglobin: 8.7 g/dL — ABNORMAL LOW (ref 13.0–17.0)
MCH: 26 pg (ref 26.0–34.0)
MCHC: 31.5 g/dL (ref 30.0–36.0)
MCV: 82.4 fL (ref 80.0–100.0)
Platelets: 253 10*3/uL (ref 150–400)
RBC: 3.35 MIL/uL — ABNORMAL LOW (ref 4.22–5.81)
RDW: 17.1 % — ABNORMAL HIGH (ref 11.5–15.5)
WBC: 8.2 10*3/uL (ref 4.0–10.5)
nRBC: 0 % (ref 0.0–0.2)

## 2021-03-27 LAB — BASIC METABOLIC PANEL
Anion gap: 7 (ref 5–15)
BUN: 24 mg/dL — ABNORMAL HIGH (ref 6–20)
CO2: 28 mmol/L (ref 22–32)
Calcium: 8.5 mg/dL — ABNORMAL LOW (ref 8.9–10.3)
Chloride: 95 mmol/L — ABNORMAL LOW (ref 98–111)
Creatinine, Ser: 0.84 mg/dL (ref 0.61–1.24)
GFR, Estimated: >60 mL/min (ref >60)
Glucose, Bld: 111 mg/dL — ABNORMAL HIGH (ref 70–99)
Potassium: 4.1 mmol/L (ref 3.5–5.1)
Sodium: 130 mmol/L — ABNORMAL LOW (ref 135–145)

## 2021-03-27 MED ORDER — LORAZEPAM 2 MG/ML IJ SOLN
INTRAMUSCULAR | Status: AC
  Administered 2021-03-27: 2 mg
  Filled 2021-03-27: qty 1

## 2021-03-27 MED ORDER — SODIUM CHLORIDE 0.9 % IV SOLN
2000.0000 mg | Freq: Once | INTRAVENOUS | Status: AC
  Administered 2021-03-27: 2000 mg via INTRAVENOUS
  Filled 2021-03-27: qty 20

## 2021-03-27 MED ORDER — LEVETIRACETAM IN NACL 1000 MG/100ML IV SOLN
1000.0000 mg | Freq: Two times a day (BID) | INTRAVENOUS | Status: DC
  Administered 2021-03-28 – 2021-03-29 (×3): 1000 mg via INTRAVENOUS
  Filled 2021-03-27 (×3): qty 100

## 2021-03-27 MED ORDER — LORAZEPAM 2 MG/ML IJ SOLN: 1.0000 mg | INTRAMUSCULAR | Status: DC | PRN

## 2021-03-27 NOTE — Evaluation (Signed)
Physical Therapy Evaluation Patient Details Name: [name] MRN: [id number] DOB: [birth year] Today's Date: 03/27/2021   History of Present Illness   Patient is 60 y.o. male who was found down at cancer center during chemo for Stage IIIb non-small cell lung cancer. Code Blue was initiated and CPR was started. ROSC within about 10 min. Intubated by anesthesia during the code. He is waking up in he ED but has required norepinephrine for hypotension despite 1L volume resuscitation. He had a low BP 76/59.  Patient was admitted to the ICU on 6/28 after cardiac arrest, suspected PEA due to hypotension.  Patient had been on cardiac medications for his underlying ICM. PMH significant for STEMI in 2017 with ICD, COPD, PAD, Rt BKA since 2009, asthma, depression, anxiety, DVT.    Clinical Impression  [name] is 60 y.o. male admitted with above HPI and diagnosis. Patient is currently limited by functional impairments below (see PT problem list). Patient lives with his wife and is modified independent with prosthesis and occasional use of walker/wheelchair for mobility at baseline. Currently he requires min assist for safety to complete transfers and gait. Pt denied symptoms but was orthostatic with transfers and BP stabilized in 90's/50's with short bout of gait. Patient will benefit from continued skilled PT interventions to address impairments and progress independence with mobility, recommending HHPT with assistance/supervision from family for mobility. Acute PT will follow and progress as able.     Orthostatic VS for the past 24 hrs:  BP- Lying Pulse- Lying BP- Sitting Pulse- Sitting BP- Standing at 0 minutes Pulse- Standing at 0 minutes BP- Standing at 5 minutes Pulse-Standing at 5 minutes    03/27/21 0913 107/71 85 90/69 98 (!) 88/74 112 92/59 103       Follow Up Recommendations   HHPT      03/27/21 0900  PT Visit Information  Last PT Received On 03/27/21  Assistance Needed +1  History of  Present Illness Patient is 60 y.o. male who was found down at cancer center during chemo for Stage IIIb non-small cell lung cancer. Code Blue was initiated and CPR was started. ROSC within about 10 min. Intubated by anesthesia during the code. He is waking up in he ED but has required norepinephrine for hypotension despite 1L volume resuscitation. He had a low BP 76/59.  Patient was admitted to the ICU on 6/28 after cardiac arrest, suspected PEA due to hypotension.  Patient had been on cardiac medications for his underlying ICM. PMH significant for STEMI in 2017 with ICD, COPD, PAD, Rt BKA since 2009, asthma, depression, anxiety, DVT.  Precautions  Precautions Fall  Precaution Comments Rt BKA (has prosthesis in room)  Restrictions  Weight Bearing Restrictions No  Home Living  Family/patient expects to be discharged to: Private residence  Living Arrangements Spouse/significant other  Available Help at Discharge Family  Type of [place] entrance (ramp has collapsed some)  Home Layout One level  Home Equipment Wheelchair - manual  Additional Comments pt lives with wife, parents in [place]  Prior Function  Level of [place] with assistive device(s);Needs assistance  Gait / Transfers Assistance Needed pt reports ambulates in home with no device using prosthesis only, he uses RW occasionally and then uses wheelchair for long distance community mobility.  Communication  Communication No difficulties  Pain Assessment  Pain Assessment No/denies pain  Cognition  Arousal/Alertness Awake/alert  Behavior During Therapy WFL for tasks assessed/performed  Overall Cognitive Status  Impaired/Different from baseline  Area of Impairment Orientation;Attention;Memory;Following commands;Safety/judgement;Awareness;Problem solving  Orientation Level Disoriented to;Situation;Time;Place  Current Attention Level Sustained  Memory Decreased short-term memory  Following  Commands Follows one step commands consistently;Follows multi-step commands inconsistently;Follows multi-step commands with increased time  Safety/Judgement Decreased awareness of safety;Decreased awareness of deficits  Awareness Emergent  Problem Solving Decreased initiation;Difficulty sequencing;Requires verbal cues;Requires tactile cues  General Comments pt unable to figure out how cell phone works at start of session, unable to answer orientation questions and aware he is confused  stating "I'm sorry I so confused right now". pt ery trangential with conversation.  Upper Extremity Assessment  Upper Extremity Assessment Overall WFL for tasks assessed  Lower Extremity Assessment  Lower Extremity Assessment RLE deficits/detail  RLE Deficits / Details residual limb with red spot on distal anterior portion. (appears to match the location of his liner seam.  Cervical / Trunk Assessment  Cervical / Trunk Assessment Normal  Bed Mobility  Overal bed mobility Needs Assistance  Bed Mobility Supine to Sit  Supine to sit Min guard;HOB elevated  General bed mobility comments cues to initiate and use bed rail, guarding for safety as pt with posterior lean initially and cues needed to sit more forward.  Transfers  Overall transfer level Needs assistance  Equipment used Rolling walker (2 wheeled)  Transfers Sit to/from [facility]  Sit to Stand Min assist;Mod assist  Stand pivot transfers Min assist;Mod assist  General transfer comment Assist for power up and to steady with rising, cues for hand placement. Min-mod assist for rise from EOB and recliner. mod asssit to [facility] walker with turn to recliner.  Ambulation/Gait  Ambulation/Gait assistance Min assist  Gait Distance (Feet) 20 Feet  Assistive device Rolling walker (2 wheeled)  Gait Pattern/deviations Step-through pattern;Decreased step length - right;Decreased step length - left;Decreased stride length  General Gait Details cues  for safe proximity to RW, and min assist to stedy intermittently. HR max of 127bpm with pair PVC's noted on tele monitor.  Gait velocity decr  Balance  Overall balance assessment Needs assistance  Sitting-balance support Feet supported  Sitting balance-Leahy Scale Good  Sitting balance - Comments pt able to don liner, socks for Rt residual limb and prosthetic in sitting.  Postural control Posterior lean  Standing balance support During functional activity;Bilateral upper extremity supported  Standing balance-Leahy Scale Poor  Standing balance comment reliant on external support  PT - End of Session  Equipment Utilized During Treatment Gait belt  Activity Tolerance Patient tolerated treatment well  Patient left in chair;with call bell/phone within reach;with chair alarm set  Nurse Communication Mobility status  PT Assessment  PT Recommendation/Assessment Patient needs continued PT services  PT Visit Diagnosis Muscle weakness (generalized) (M62.81);Difficulty in walking, not elsewhere classified (R26.2)  PT Problem List Decreased strength;Decreased range of motion;Decreased activity tolerance;Decreased balance;Decreased mobility  PT Plan  PT Frequency (ACUTE ONLY) Min 3X/week  PT Treatment/Interventions (ACUTE ONLY) DME instruction;Gait training;Stair training;Functional mobility training;Therapeutic activities;Therapeutic exercise;Balance training;Patient/family education  AM-PAC PT "6 Clicks" Mobility Outcome Measure (Version 2)  Help needed turning from your back to your side while in a flat bed without using bedrails? 3  Help needed moving from lying on your back to sitting on the side of a flat bed without using bedrails? 3  Help needed moving to and from a bed to a chair (including a wheelchair)? 2  Help needed standing up from a chair using your arms (e.g., wheelchair or bedside chair)? 2  Help needed  to walk in hospital room? 3  Help needed climbing 3-5 steps with a railing?  2  6  Click Score 15  Consider Recommendation of Discharge To: CIR/SNF/LTACH  PT Recommendation  Follow Up Recommendations Home health PT;Supervision for mobility/OOB  PT equipment None recommended by PT  Individuals Consulted  Consulted and Agree with Results and Recommendations Patient  Acute Rehab PT Goals  Patient Stated Goal get home to wife  PT Goal Formulation With patient  Time For Goal Achievement 04/10/21  Potential to Achieve Goals Good  PT Time Calculation  PT Start Time (ACUTE ONLY) 0900  PT Stop Time (ACUTE ONLY) 0939  PT Time Calculation (min) (ACUTE ONLY) 39 min  PT General Charges  $$ ACUTE PT VISIT 1 Visit  PT Evaluation  $PT Eval Moderate Complexity 1 Mod  PT Treatments  $Gait Training 8-22 mins  $Therapeutic Activity 8-22 mins  Written Expression  Dominant Hand Right     Verner Mould, DPT Acute Rehabilitation Services Office [phone] Pager [phone]   Jacques Navy 03/27/2021, 3:26 PM

## 2021-03-27 NOTE — Progress Notes (Signed)
Palliative care consult received.  Chart reviewed.  Discussed with Dr. Maylene Roes.  At this point, Mr. Loss is improving clinically and approaching discharge.  We will hold on formal palliative consult at this time.  If he needs further discussion following discharge, could consider outpatient palliative care referral by his oncologist if indicated.  Micheline Rough, MD [place] Team [phone]  No charge note

## 2021-03-27 NOTE — Progress Notes (Addendum)
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 When he was found, he was pulseless and CPR was started. ROSC within about 10 min. Intubated by anesthesia during the code. He is waking up in he ED but has required norepinephrine for hypotension despite 1L volume resuscitation. He had a low BP 76/59 prior to his appointment, but has had low BPs at other appointments recently. Per his wife, he has not been eating well due to dysgeusia, but has been drinking plenty of water. He has been compliant with all of his medications. He was not feeling well yesterday and slept most of the day yesterday.  Patient was admitted to the ICU on 6/28 after cardiac arrest, suspected PEA due to hypotension.  Patient had been on cardiac medications for his underlying ICM.  New events last 24 hours / Subjective: During morning rounds, patient reported feeling well without any complaints. He was seen by PT, orthostatics negative 107/71 --> 92/59 and asymptomatic but somewhat confused and tangential.   This afternoon around 3:30pm, rapid response called after seizure episode. Patient was seen in his recliner, staring off, then had about 30 second episode of shaking. By the time I arrived, patient was alert but post-ictal, was able to ask "what" but did not answer questions or follow commands appropriately. Vital signs stable and blood sugar was 159.   Assessment & Plan:   Principal Problem:   Cardiac arrest [facility]) Active Problems:   COPD with chronic bronchitis and emphysema (HCC)   Non-small cell carcinoma of right lung, stage 3 (HCC)   Seizures (HCC)   Status post PEA arrest, suspected due to hypotension with heart failure medications combined with poor  dietary intake -Now extubated and off pressors -Continue midodrine -PT rec home health   Seizure -Stat head CT, MRI brain, EEG, Ativan PRN for seizure, seizure precaution  -Consult Neurology, discussed with Dr. Rory Percy  -There is seizure documented in 2019, Dr. Mickeal Skinner had started keppra 750mg  BID. MRI at that time without brain mets. Looks like patient himself discontinued keppra in 2021 because "it made him feel cloudy" and had declined anti-epileptic drug then (see Dr. Mickeal Skinner office note 11/01/19)  -[facility] to [facility]  -IV keppra 2000mg  load then 1000mg  BID   COPD exacerbation -Continue prednisone, azithromycin  History of ICM, chronic systolic heart failure -Continue to hold Coreg, spironolactone, Lasix, lisinopril  Hypothyroidism -Continue Synthroid  Stage IIIb non-small cell lung cancer -Followed by Dr. Julien Nordmann  Sepsis ruled out -Presented with WBC 29, HR 72, RR 17, temperature 97.8 -Procalcitonin remains negative, lactic acid 1.9 -Blood cultures negative to date -Respiratory culture with normal respiratory flora -No source of infection found  Hyperlipidemia -Continue Lipitor    DVT prophylaxis:  enoxaparin (LOVENOX) injection 40 mg Start: 03/24/21 2200 SCDs Start: 03/24/21 1400  Code Status:     Code Status Orders  (From admission, onward)           Start     Ordered   03/24/21 1445  Do not attempt resuscitation (DNR)  Continuous       Question Answer Comment  In the event of cardiac or respiratory ARREST Do not call a "code blue"   In  the event of cardiac or respiratory ARREST Do not perform Intubation, CPR, defibrillation or ACLS   In the event of cardiac or respiratory ARREST Use medication by any route, position, wound care, and other measures to relive pain and suffering. May use oxygen, suction and manual treatment of airway obstruction as needed for comfort.      03/24/21 1444           Code Status History     Date Active Date Inactive  Code Status Order ID Comments User Context   03/24/2021 1403 03/24/2021 1444 Full Code [id number]  Julian Hy, DO ED   03/02/2018 1039 03/03/2018 1950 Full Code [id number]  [name], MD Inpatient   05/26/2016 0344 05/28/2016 1828 Full Code [id number]  [name], MD Inpatient   05/26/2016 0344 05/26/2016 0344 Full Code [id number]  Corotto, Sammuel Hines, MD Inpatient      Advance Directive Documentation    Flowsheet Row Most Recent Value  Type of Advance Directive Living will  [per computer notes]  Pre-existing out of facility DNR order (yellow form or pink MOST form) --  "MOST" Form in Place? --      Family Communication: No family at bedside, called spouse x2 no answer  Disposition Plan:  Status is: Inpatient  Remains inpatient appropriate because:Hemodynamically unstable  Dispo: The patient is from: Home              Anticipated d/c is to: Home              Patient currently is not medically stable to d/c.   Difficult to place patient No      Antimicrobials:  Anti-infectives (From admission, onward)    Start     Dose/Rate Route Frequency Ordered Stop   03/25/21 1800  azithromycin (ZITHROMAX) tablet 500 mg        500 mg Oral Daily-1800 03/25/21 0814     03/25/21 0400  vancomycin (VANCOREADY) IVPB 1500 mg/300 mL  Status:  Discontinued        1,500 mg 150 mL/hr over 120 Minutes Intravenous Every 12 hours 03/24/21 1430 03/25/21 0805   03/25/21 0000  ceFEPIme (MAXIPIME) 2 g in sodium chloride 0.9 % 100 mL IVPB  Status:  Discontinued        2 g 200 mL/hr over 30 Minutes Intravenous Every 8 hours 03/24/21 1430 03/26/21 1535   03/24/21 1800  azithromycin (ZITHROMAX) 500 mg in sodium chloride 0.9 % 250 mL IVPB  Status:  Discontinued        500 mg 250 mL/hr over 60 Minutes Intravenous Every 24 hours 03/24/21 1520 03/25/21 0814   03/24/21 1445  vancomycin (VANCOREADY) IVPB 2000 mg/400 mL        2,000 mg 200 mL/hr over 120 Minutes Intravenous  Once 03/24/21 1430 03/24/21 1939    03/24/21 1445  ceFEPIme (MAXIPIME) 2 g in sodium chloride 0.9 % 100 mL IVPB        2 g 200 mL/hr over 30 Minutes Intravenous  Once 03/24/21 1430 03/24/21 1705        Objective: Vitals:   03/27/21 0340 03/27/21 0443 03/27/21 1251 03/27/21 1539  BP: 117/74  97/70 118/89  Pulse: 73  95 (!) 110  Resp: 18  16   Temp: 98.4 F (36.9 C)  98.4 F (36.9 C)   TempSrc: Oral  Oral   SpO2: 97%  100% 100%  Weight:  91.1 kg    Height:  Intake/Output Summary (Last 24 hours) at 03/27/2021 1544 Last data filed at 03/27/2021 1100 Gross per 24 hour  Intake --  Output 925 ml  Net -925 ml    Filed Weights   03/24/21 1421 03/27/21 0443  Weight: 94 kg 91.1 kg   Examination (AM): General exam: Appears calm and comfortable  Respiratory system: Clear to auscultation. Respiratory effort normal. Cardiovascular system: S1 & S2 heard, RRR. No pedal edema. Gastrointestinal system: Abdomen is nondistended, soft and nontender. Normal bowel sounds heard. Central nervous system: Alert and oriented. Non focal exam. Speech clear  Extremities: Right BKA  Examination (after seizure): General exam: Diaphoretic, alert but unresponsive  Respiratory system: Respiratory effort normal. Cardiovascular system: S1 & S2 heard, tachycardic. No pedal edema. Central nervous system: Alert but post-ictal, confused, able to say "what" but does not answer questions. Able to follow command to grip hands bilaterally, although left hand grip weaker.  Extremities: Right BKA with prosthetic leg  Skin: No rashes, lesions or ulcers on exposed skin    Data Reviewed: I have personally reviewed following labs and imaging studies  CBC: Recent Labs  Lab 03/24/21 0938 03/24/21 1337 03/24/21 1612 03/25/21 0648 03/26/21 0431 03/27/21 0415  WBC 17.3* 26.0* 29.0* 17.4* 13.1* 8.2  NEUTROABS 14.5* 20.5*  --   --   --   --   HGB 9.5* 9.4* 9.1* 8.1* 8.3* 8.7*  HCT 28.8* 30.2* 28.6* 25.4* 26.1* 27.6*  MCV 80.0 83.4 82.7 83.0  83.4 82.4  PLT 318 467* 306 226 242 938    Basic Metabolic Panel: Recent Labs  Lab 03/24/21 0938 03/24/21 1337 03/24/21 1612 03/25/21 0648 03/26/21 0431 03/27/21 0415  NA 130* 131*  --  132* 131* 130*  K 4.3 4.0  --  4.3 4.0 4.1  CL 91* 96*  --  95* 97* 95*  CO2 28 27  --  28 28 28   GLUCOSE 139* 241*  --  121* 117* 111*  BUN 9 10  --  11 17 24*  CREATININE 0.84 0.91 0.89 0.80 0.84 0.84  CALCIUM 8.7* 7.8*  --  7.9* 8.1* 8.5*  MG  --  2.1  --  1.8  --   --   PHOS  --   --   --  3.9  --   --     GFR: Estimated Creatinine Clearance: 105.2 mL/min (by C-G formula based on SCr of 0.84 mg/dL). Liver Function Tests: Recent Labs  Lab 03/24/21 0938 03/24/21 1337  AST 41 43*  ALT 42 42  ALKPHOS 136* 121  BILITOT 0.3 0.7  PROT 6.6 6.2*  ALBUMIN 2.6* 2.8*    No results for input(s): LIPASE, AMYLASE in the last 168 hours. No results for input(s): AMMONIA in the last 168 hours. Coagulation Profile: Recent Labs  Lab 03/24/21 1611  INR 1.0    Cardiac Enzymes: No results for input(s): CKTOTAL, CKMB, CKMBINDEX, TROPONINI in the last 168 hours. BNP (last 3 results) No results for input(s): PROBNP in the last 8760 hours. HbA1C: No results for input(s): HGBA1C in the last 72 hours. CBG: Recent Labs  Lab 03/26/21 1629 03/26/21 2125 03/27/21 0939 03/27/21 1152 03/27/21 1541  GLUCAP 110* 137* 113* 142* 159*    Lipid Profile: No results for input(s): CHOL, HDL, LDLCALC, TRIG, CHOLHDL, LDLDIRECT in the last 72 hours. Thyroid Function Tests: Recent Labs    03/24/21 1611 03/24/21 1613  TSH 11.224*  --   FREET4  --  1.18*    Anemia Panel: No  results for input(s): VITAMINB12, FOLATE, FERRITIN, TIBC, IRON, RETICCTPCT in the last 72 hours. Sepsis Labs: Recent Labs  Lab 03/24/21 1611 03/24/21 1612 03/25/21 0648 03/26/21 0431  PROCALCITON  --  <0.10 <0.10 <0.10  LATICACIDVEN 1.9  --   --   --      Recent Results (from the past 240 hour(s))  Resp Panel by RT-PCR  (Flu A&B, Covid) Nasopharyngeal Swab     Status: None   Collection Time: 03/24/21  2:17 PM   Specimen: Nasopharyngeal Swab; Nasopharyngeal(NP) swabs in vial transport medium  Result Value Ref Range Status   SARS Coronavirus 2 by RT PCR NEGATIVE NEGATIVE Final    Comment: (NOTE) SARS-CoV-2 target nucleic acids are NOT DETECTED.  The SARS-CoV-2 RNA is generally detectable in upper respiratory specimens during the acute phase of infection. The lowest concentration of SARS-CoV-2 viral copies this assay can detect is 138 copies/mL. A negative result does not preclude SARS-Cov-2 infection and should not be used as the sole basis for treatment or other patient management decisions. A negative result may occur with  improper specimen collection/handling, submission of specimen other than nasopharyngeal swab, presence of viral mutation(s) within the areas targeted by this assay, and inadequate number of viral copies(<138 copies/mL). A negative result must be combined with clinical observations, patient history, and epidemiological information. The expected result is Negative.  Fact Sheet for Patients:  EntrepreneurPulse.com.au  Fact Sheet for Healthcare Providers:  IncredibleEmployment.be  This test is no t yet approved or cleared by the [place] FDA and  has been authorized for detection and/or diagnosis of SARS-CoV-2 by FDA under an Emergency Use Authorization (EUA). This EUA will remain  in effect (meaning this test can be used) for the duration of the COVID-19 declaration under Section 564(b)(1) of the Act, 21 U.S.C.section 360bbb-3(b)(1), unless the authorization is terminated  or revoked sooner.       Influenza A by PCR NEGATIVE NEGATIVE Final   Influenza B by PCR NEGATIVE NEGATIVE Final    Comment: (NOTE) The Xpert Xpress SARS-CoV-2/FLU/RSV plus assay is intended as an aid in the diagnosis of influenza from Nasopharyngeal swab specimens  and should not be used as a sole basis for treatment. Nasal washings and aspirates are unacceptable for Xpert Xpress SARS-CoV-2/FLU/RSV testing.  Fact Sheet for Patients: EntrepreneurPulse.com.au  Fact Sheet for Healthcare Providers: IncredibleEmployment.be  This test is not yet approved or cleared by the [place] FDA and has been authorized for detection and/or diagnosis of SARS-CoV-2 by FDA under an Emergency Use Authorization (EUA). This EUA will remain in effect (meaning this test can be used) for the duration of the COVID-19 declaration under Section 564(b)(1) of the Act, 21 U.S.C. section 360bbb-3(b)(1), unless the authorization is terminated or revoked.  Performed at [facility], [place] [address], [place], [place] [address]   MRSA PCR Screening     Status: None   Collection Time: 03/24/21  3:18 PM  Result Value Ref Range Status   MRSA by PCR NEGATIVE NEGATIVE Final    Comment:        The GeneXpert MRSA Assay (FDA approved for NASAL specimens only), is one component of a comprehensive MRSA colonization surveillance program. It is not intended to diagnose MRSA infection nor to guide or monitor treatment for MRSA infections. Performed at [facility], [place] [address] [place], [place] [address]   Culture, Respiratory w Gram Stain     Status: None   Collection Time: 03/24/21  3:55 PM  Specimen: Tracheal Aspirate; Respiratory  Result Value Ref Range Status   Specimen Description   Final    TRACHEAL ASPIRATE Performed at [place] [address], [place], [place] [address]    Special Requests   Final    NONE Performed at [facility], [place] [address], [place], [place] [address]    Gram Stain   Final    MODERATE WBC PRESENT,BOTH PMN AND MONONUCLEAR FEW GRAM POSITIVE COCCI IN CHAINS FEW GRAM POSITIVE RODS    Culture   Final    Normal respiratory  flora-no Staph aureus or Pseudomonas seen Performed at [facility], [address], [place], [place] [address]    Report Status 03/27/2021 FINAL  Final  Culture, blood (routine x 2)     Status: None (Preliminary result)   Collection Time: 03/24/21  4:09 PM   Specimen: BLOOD  Result Value Ref Range Status   Specimen Description   Final    BLOOD RIGHT ANTECUBITAL Performed at [place] [address] [place], [place] [address]    Special Requests   Final    BOTTLES DRAWN AEROBIC ONLY Blood Culture adequate volume Performed at [place] [address], [place], [place] [address]    Culture   Final    NO GROWTH 3 DAYS Performed at [facility], [place] [address] [place], [place] [address]    Report Status PENDING  Incomplete  Culture, blood (routine x 2)     Status: None (Preliminary result)   Collection Time: 03/24/21  4:10 PM   Specimen: BLOOD  Result Value Ref Range Status   Specimen Description   Final    BLOOD LEFT ANTECUBITAL Performed at [facility] [address], [place], [place] [address]    Special Requests   Final    BOTTLES DRAWN AEROBIC ONLY Blood Culture results may not be optimal due to an inadequate volume of blood received in culture bottles Performed at [facility] [address], [place], [place] [address]    Culture   Final    NO GROWTH 3 DAYS Performed at [facility], [place] [address] [place], [place] [address]    Report Status PENDING  Incomplete       Radiology Studies: No results found.    Scheduled Meds:  (feeding supplement) PROSource Plus  30 mL Oral BID BM   amitriptyline  100 mg Oral QHS   aspirin EC  81 mg Oral Daily   atorvastatin  80 mg Oral Daily   azithromycin  500 mg Oral q1800   Chlorhexidine Gluconate Cloth  6 each Topical Daily   citalopram  40 mg Oral Daily   enoxaparin (LOVENOX) injection  40 mg Subcutaneous Q24H    feeding supplement  1 Container Oral BID BM   folic acid  1 mg Oral Daily   insulin aspart  0-15 Units Subcutaneous TID WC   ipratropium-albuterol  3 mL Nebulization BID   levothyroxine  125 mcg Oral Q0600   LORazepam       mouth rinse  15 mL Mouth Rinse BID   midodrine  10 mg Oral TID WC   multivitamin with minerals  1 tablet Oral Daily   predniSONE  40 mg Oral Q breakfast   Continuous Infusions:     LOS: 3 days      Time spent: 50 minutes   Dessa Phi, DO [facility] 03/27/2021, 3:44  PM   Available via Epic secure chat 7am-7pm After these hours, please refer to coverage provider listed on [email or web address]

## 2021-03-27 NOTE — Progress Notes (Signed)
[facility] called for transport of patient.  Attempted to call report x's 2 to nurse at [facility] 3W room 8.   MRI contacted this nurse earlier and stated that the representative for the brand of  pacemaker the patient has was gone for the weekend and will not be returning until Monday.   Report given to night shift nurse at [facility].

## 2021-03-27 NOTE — Progress Notes (Signed)
RN at bedside, patient confused. Patient then yelled out arms swung out and patient appeared to have seizure like activity. Patient up in chair at time of seizure activity. Seizure like activity last 45 seconds. Oral suction performed. RRT called. MD paged. MD to bedside to assess. Orders to received.

## 2021-03-27 NOTE — Progress Notes (Signed)
Remote ICD transmission.   

## 2021-03-28 ENCOUNTER — Inpatient Hospital Stay (HOSPITAL_COMMUNITY): Payer: Medicare Other

## 2021-03-28 DIAGNOSIS — E039 Hypothyroidism, unspecified: Secondary | ICD-10-CM

## 2021-03-28 DIAGNOSIS — J449 Chronic obstructive pulmonary disease, unspecified: Secondary | ICD-10-CM

## 2021-03-28 DIAGNOSIS — R569 Unspecified convulsions: Secondary | ICD-10-CM

## 2021-03-28 LAB — GLUCOSE, CAPILLARY
Glucose-Capillary: 135 mg/dL — ABNORMAL HIGH (ref 70–99)
Glucose-Capillary: 168 mg/dL — ABNORMAL HIGH (ref 70–99)
Glucose-Capillary: 144 mg/dL — ABNORMAL HIGH (ref 70–99)
Glucose-Capillary: 148 mg/dL — ABNORMAL HIGH (ref 70–99)

## 2021-03-28 MED ORDER — DICYCLOMINE HCL 10 MG/5ML PO SOLN
10.0000 mg | Freq: Once | ORAL | Status: AC
  Administered 2021-03-28: 10 mg via ORAL
  Filled 2021-03-28: qty 5

## 2021-03-28 MED ORDER — ALUM & MAG HYDROXIDE-SIMETH 200-200-20 MG/5ML PO SUSP
30.0000 mL | Freq: Four times a day (QID) | ORAL | Status: DC | PRN
  Administered 2021-03-28: 30 mL via ORAL
  Filled 2021-03-28: qty 30

## 2021-03-28 MED ORDER — PREDNISONE 5 MG PO TABS: 30.0000 mg | ORAL_TABLET | Freq: Every day | ORAL | Status: DC

## 2021-03-28 MED ORDER — PANTOPRAZOLE SODIUM 40 MG PO TBEC
40.0000 mg | DELAYED_RELEASE_TABLET | Freq: Every day | ORAL | Status: DC
  Administered 2021-03-29: 40 mg via ORAL
  Filled 2021-03-28: qty 1

## 2021-03-28 NOTE — Progress Notes (Signed)
Pt alert, VSS, no c/o pain, no s/s of respiratory distress O2 sat 100% RA, able to take scheduled PO meds and pt transferred to [facility] via [facility]. Report given to the receiving nurse at [facility].

## 2021-03-28 NOTE — Consult Note (Addendum)
NEUROLOGY CONSULTATION NOTE   Date of service: March 28, 2021 Patient Name: [name] MRN:  [id number] DOB:  Jun 16, 1961 Reason for consult: "Seizure" Requesting Provider: Dessa Phi, DO _ _ _   _ __   _ __ _ _  __ __   _ __   __ _  History of Present Illness  [name] is a 60 y.o. male with a medical history significant for HFrEF due to ischemic cardiomyopathy, non-small cell carcinoma of the right lung- stage III, hyperlipidemia, peripheral arterial disease s/p right BKA who presented to [place] on 03/24/2021 after being found down and pulseless in the bathroom at his chemotherapy appointment. CPR was initiated with ROSC achieved in approximately 10 minutes with suspected PEA 2/2 hypotension. He was hypotensive initially following ROSC requiring vasopressors and fluid for blood pressure management and was able to be extubated within a few hours of admission. He was feeling well and requesting to go home until the afternoon of 03/27/2021 when the bedside RN witnessed [name] stare off, yell out, swing his arms out, and then begin to shake for approximately 30 seconds with residual confusion and lethargy following concerning for seizure with post-ictal state. He was then transferred to [facility] for further neurology evaluation.   [name] is followed by Dr. Mickeal Skinner for seizure-like activity and was last seen in February of 2021 with concern for occasional seizure-like events where [name] would shake with altered awareness lasting several minutes. These were occurring approximately weekly. He had been prescribed keppra but decided that he did not want to continue to take the medication due to it making him feel cloudy. He refused further antiepileptic drugs at this appointment and has not been back for follow up.   He endorses history of seizures as a kid. Started when he was 10 or 11. Sometimes gets an aura of head and fingers tingling. If he is driving, he pulls over. He then goes into either  starring off or jerking of arms and legs with resolution of his symptoms and somnolence. Does not think his seizures are frequent, does not want to take any medications for this.   ROS   Constitutional Denies weight loss, fever and chills.   HEENT Denies changes in vision and hearing.   Respiratory Denies SOB and cough.   CV Denies palpitations and CP   GI Denies abdominal pain, nausea, vomiting and diarrhea.   GU Denies dysuria and urinary frequency.   MSK Denies myalgia and joint pain.   Skin Denies rash and pruritus.   Neurological Denies headache and syncope.   Psychiatric Denies recent changes in mood. Denies anxiety and depression.    Past History   Past Medical History:  Diagnosis Date  . Acute hepatitis C virus infection 06/19/2007   Qualifier: Diagnosis of  By: Leilani Merl CMA, Tiffany    . Acute ST elevation myocardial infarction (STEMI) involving left anterior descending (LAD) coronary artery ([place]) 05/26/2016  . Acute ST elevation myocardial infarction (STEMI) of lateral wall ([place]) 05/26/2016  . 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 Anxiety   . Asthma   . Atherosclerosis of native arteries of the extremities with intermittent claudication 12/16/2011  . Chronic hepatitis C without hepatic coma ([place]) 05/03/2014  . COPD with chronic bronchitis and emphysema ([place]) 03/13/2018   FEV1 57%  . Depression   . DVT (deep venous thrombosis) ([place]) 2009; 2019   RLE; LLE  . Encounter for antineoplastic chemotherapy 03/22/2018  . Hepatitis  C    "tx'd in 2015"  . High cholesterol   . Ischemic cardiomyopathy 03/02/2018  . Non-small cell carcinoma of right lung, stage 3 ([place]) 03/22/2018  . PAD (peripheral artery disease) ([place]) 01/25/2013  . Peripheral vascular disease, unspecified 07/20/2012  . Port-A-Cath in place 04/24/2018  . ST elevation myocardial infarction involving left anterior descending (LAD) coronary artery ([place])   . STEMI (ST elevation myocardial infarction)  ([place]) 05/26/2016  . Tobacco abuse 01/28/2016  . Tubular adenoma of colon 07/11/2014  . Urinary dribbling    Past Surgical History:  Procedure Laterality Date  . AORTOGRAM  08/08/2009   for LLE claudication     By Dr. Oneida Alar  . 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 BIOPSY N/A 05/30/2014   Procedure: BIOPSY;  Surgeon: [name], MD;  Location: [facility];  Service: Endoscopy;  Laterality: N/A;  . BLADDER TUMOR EXCISION  8/812  . CARDIAC CATHETERIZATION N/A 05/26/2016   Procedure: Left Heart Cath and Coronary Angiography;  Surgeon: [name], MD;  Location: [facility] [facility];  Service: Cardiovascular;  Laterality: N/A;  . CARDIAC CATHETERIZATION N/A 05/26/2016   Procedure: Coronary Stent Intervention;  Surgeon: [name], MD;  Location: [place] [facility];  Service: Cardiovascular;  Laterality: N/A;  . COLONOSCOPY WITH PROPOFOL N/A 05/30/2014   IWP:YKDXIP colonic polyp-likely source of hematochezia-removed as described above  . ESOPHAGOGASTRODUODENOSCOPY (EGD) WITH PROPOFOL N/A 05/30/2014   JAS:NKNLZJ and bulbar erosions s/p gastric biopsy. No evidence of portal gastropathy on today's examination.  . FEMORAL-TIBIAL BYPASS GRAFT  2009   Right side using non-reversed GSV   By Dr. Oneida Alar  . FEMORAL-TIBIAL BYPASS GRAFT  11/24/2007   Right femoral to anterior tibial BPG   by Dr. Oneida Alar  . FINGER SURGERY Left    "straightened my pinky"  . HAND TENDON SURGERY Left 2013   Left 5th finger  . HERNIA REPAIR     "stomach"  . ICD IMPLANT N/A 03/02/2018   Procedure: ICD IMPLANT;  Surgeon: [name], MD;  Location: [facility];  Service: Cardiovascular;  Laterality: N/A;  . INCISIONAL HERNIA REPAIR N/A 12/25/2014   Procedure: HERNIA REPAIR INCISIONAL WITH MESH;  Surgeon: [name], MD;  Location: [facility];  Service: General;  Laterality: N/A;  . INSERTION OF MESH N/A 12/25/2014   Procedure: INSERTION OF MESH;  Surgeon: [name], MD;  Location: [facility];  Service:  General;  Laterality: N/A;  . IR IMAGING GUIDED PORT INSERTION  04/11/2018  . LAPAROSCOPIC CHOLECYSTECTOMY    . LOWER EXTREMITY ANGIOGRAM Left 12/30/2015   Procedure: Lower Extremity Angiogram;  Surgeon: [name], MD;  Location: [facility];  Service: Cardiovascular;  Laterality: Left;  . PERIPHERAL VASCULAR CATHETERIZATION N/A 12/30/2015   Procedure: Abdominal Aortogram;  Surgeon: [name], MD;  Location: [place] [facility];  Service: Cardiovascular;  Laterality: N/A;  . POLYPECTOMY  05/30/2014   Procedure: POLYPECTOMY;  Surgeon: [name], MD;  Location: [facility];  Service: Endoscopy;;  . TONSILLECTOMY AND ADENOIDECTOMY  ~ 1970  . TYMPANOSTOMY TUBE PLACEMENT Bilateral ~ 1970   Family History  Problem Relation Age of Onset  . Vision loss Mother   . Ulcers Father   . Colon cancer Neg Hx    Social History   Socioeconomic History  . Marital status: Married    Spouse name: Not on file  . Number of children: Not on file  . Years of  education: Not on file  . Highest education level: Not on file  Occupational History  . Not on file  Tobacco Use  . Smoking status: Every Day    Packs/day: 0.50    Years: 31.00    Pack years: 15.50    Types: Cigarettes  . Smokeless tobacco: Never  Vaping Use  . Vaping Use: Never used  Substance and Sexual Activity  . Alcohol use: Yes    Alcohol/week: 18.0 standard drinks    Types: 18 Cans of beer per week    Comment:  history of heavy ETOH use; 03/02/2018 "6-12 beers Fri & Sat"  . Drug use: Yes    Types: Marijuana    Comment: 03/02/2018 "weekly"  . Sexual activity: Not Currently  Other Topics Concern  . Not on file  Social History Narrative  . Not on file   Social Determinants of Health   Financial Resource Strain: Not on file  Food Insecurity: Not on file  Transportation Needs: Not on file  Physical Activity: Not on file  Stress: Not on file  Social Connections: Not on file   Allergies  Allergen Reactions  . Lyrica  [Pregabalin] Palpitations    Chest pain and heart palps.  . Neurontin [Gabapentin] Palpitations    Chest pain and heart palp    Medications   Medications Prior to Admission  Medication Sig Dispense Refill Last Dose  . albuterol (VENTOLIN HFA) 108 (90 Base) MCG/ACT inhaler Inhale 1-2 puffs into the lungs every 4 (four) hours as needed for wheezing or shortness of breath.   03/23/2021  . alprazolam (XANAX) 2 MG tablet Take 2 mg by mouth 4 (four) times daily as needed for anxiety.   03/24/2021  . amitriptyline (ELAVIL) 100 MG tablet Take 100 mg by mouth at bedtime.    03/23/2021  . aspirin 81 MG EC tablet Take 81 mg by mouth daily.   03/23/2021  . atorvastatin (LIPITOR) 80 MG tablet Take 1 tablet (80 mg total) by mouth daily at 6 PM. (Patient taking differently: Take 80 mg by mouth daily.) 30 tablet 12 03/24/2021  . carvedilol (COREG) 6.25 MG tablet TAKE 1 TABLET BY MOUTH TWICE A DAY WITH A MEAL NEEDS APPT FOR REFILLS (Patient taking differently: Take 6.25 mg by mouth 2 (two) times daily with a meal.) 180 tablet 2 03/24/2021 at 8.30 am  . citalopram (CELEXA) 40 MG tablet Take 40 mg by mouth daily.  11 03/24/2021  . dexamethasone (DECADRON) 4 MG tablet Please take 1 tablet twice a day the day before, the day of, and the day after treatment (Patient taking differently: Take 4 mg by mouth 2 (two) times daily. Please take 1 tablet twice a day the day before, the day of, and the day after treatment) 40 tablet 2 03/24/2021  . folic acid (FOLVITE) 1 MG tablet Take 1 tablet (1 mg total) by mouth daily. 30 tablet 2 03/24/2021  . furosemide (LASIX) 20 MG tablet Take 1 tablet (20 mg total) by mouth daily. Please make overdue appt with Dr. Oval Linsey before anymore refills. 2nd attempt (Patient taking differently: Take 20 mg by mouth daily.) 15 tablet 0 03/24/2021  . HYDROcodone-acetaminophen (NORCO) 10-325 MG tablet Take 1 tablet by mouth every 4 (four) hours as needed for moderate pain.   0 03/24/2021  . levothyroxine  (SYNTHROID) 125 MCG tablet Take 125 mcg by mouth daily.   03/24/2021  . lidocaine-prilocaine (EMLA) cream Apply 1 application topically as needed. (Patient taking differently: Apply 1 application  topically once as needed (port access).) 30 g 0 03/17/2021  . lisinopril (PRINIVIL,ZESTRIL) 2.5 MG tablet Take 1 tablet (2.5 mg total) by mouth daily. 30 tablet 12 03/24/2021  . nitroGLYCERIN (NITROSTAT) 0.4 MG SL tablet Place 1 tablet (0.4 mg total) under the tongue every 5 (five) minutes x 3 doses as needed for chest pain. 25 tablet 2 unk last dose  . prochlorperazine (COMPAZINE) 10 MG tablet Take 1 tablet (10 mg total) by mouth every 6 (six) hours as needed for nausea or vomiting. 30 tablet 2 03/19/2021  . spironolactone (ALDACTONE) 25 MG tablet TAKE 1 TABLET BY MOUTH EVERY DAY (Patient taking differently: Take 25 mg by mouth daily.) 90 tablet 0 03/24/2021  . sucralfate (CARAFATE) 1 g tablet Take 1 tablet (1 g total) by mouth 4 (four) times daily -  with meals and at bedtime. 5 min before meals for radiation induced esophagitis (Patient taking differently: Take 1 g by mouth daily. 5 min before meals for radiation induced esophagitis) 120 tablet 2 03/23/2021  . tamsulosin (FLOMAX) 0.4 MG CAPS capsule Take 0.4 mg by mouth daily.   03/24/2021  . TRELEGY ELLIPTA 100-62.5-25 MCG/INH AEPB Inhale 1 puff into the lungs daily.   03/24/2021     Vitals   Vitals:   03/27/21 2103 03/27/21 2333 03/28/21 0009 03/28/21 0412  BP: 97/67 106/84 118/75 112/75  Pulse: 81 88 88 92  Resp: 16 16 20 18   Temp: 97.8 F (36.6 C)  98.5 F (36.9 C) 98.5 F (36.9 C)  TempSrc: Oral  Oral Oral  SpO2: 95% 100% 100% 99%  Weight:      Height:         Body mass index is 27.24 kg/m.  Physical Exam   General: Laying comfortably in bed; in no acute distress.  HENT: Normal oropharynx and mucosa. Normal external appearance of ears and nose.  Neck: Supple, no pain or tenderness  CV: No JVD. No peripheral edema.  Pulmonary: Symmetric  Chest rise. Normal respiratory effort.  Abdomen: Soft to touch, non-tender.  Ext: No cyanosis, edema, or deformity  Skin: No rash. Normal palpation of skin.   Musculoskeletal: Normal digits and nails by inspection. No clubbing.   Neurologic Examination  Mental status/Cognition: Alert, oriented to self, place, month and year, good attention.  Speech/language: Fluent, comprehension intact, object naming intact, repetition intact.  Cranial nerves:   CN II Pupils equal and reactive to light, no VF deficits    CN III,IV,VI EOM intact, no gaze preference or deviation, no nystagmus    CN V normal sensation in V1, V2, and V3 segments bilaterally    CN VII no asymmetry, no nasolabial fold flattening    CN VIII normal hearing to speech    CN IX & X normal palatal elevation, no uvular deviation    CN XI 5/5 head turn and 5/5 shoulder shrug bilaterally    CN XII midline tongue protrusion    Motor:  Muscle bulk: normal, tone normal, pronator drift none tremor none Mvmt Root Nerve  Muscle Right Left Comments  SA C5/6 Ax Deltoid 5 5   EF C5/6 Mc Biceps 5 5   EE C6/7/8 Rad Triceps 5 5   WF C6/7 Med FCR     WE C7/8 PIN ECU     F Ab C8/T1 U ADM/FDI 5 5   HF L1/2/3 Fem Illopsoas 5 5   KE L2/3/4 Fem Quad 5 5   DF L4/5 D Peron Tib Ant - 5 Right  BKA  PF S1/2 Tibial Grc/Sol - 5    Reflexes:  Right Left Comments  Pectoralis      Biceps (C5/6) 2 2   Brachioradialis (C5/6) 2 2    Triceps (C6/7) 2 2    Patellar (L3/4) 0 0    Achilles (S1)      Hoffman      Plantar     Jaw jerk    Sensation:  Light touch intact   Pin prick    Temperature    Vibration   Proprioception    Coordination/Complex Motor:  - Finger to Nose intact - Heel to shin unable to assess but can make a smooth circle in the air with BL legs. - Rapid alternating movement are normal - Gait: Deferred.  Labs   CBC:  Recent Labs  Lab 03/24/21 0938 03/24/21 1337 03/24/21 1612 03/26/21 0431 03/27/21 0415  WBC 17.3*  26.0*   < > 13.1* 8.2  NEUTROABS 14.5* 20.5*  --   --   --   HGB 9.5* 9.4*   < > 8.3* 8.7*  HCT 28.8* 30.2*   < > 26.1* 27.6*  MCV 80.0 83.4   < > 83.4 82.4  PLT 318 467*   < > 242 253   < > = values in this interval not displayed.    Basic Metabolic Panel:  Lab Results  Component Value Date   NA 130 (L) 03/27/2021   K 4.1 03/27/2021   CO2 28 03/27/2021   GLUCOSE 111 (H) 03/27/2021   BUN 24 (H) 03/27/2021   CREATININE 0.84 03/27/2021   CALCIUM 8.5 (L) 03/27/2021   GFRNONAA >60 03/27/2021   GFRAA >60 06/18/2020   Lipid Panel:  Lab Results  Component Value Date   LDLCALC 38 01/31/2018   HgbA1c: No results found for: HGBA1C Urine Drug Screen: No results found for: LABOPIA, COCAINSCRNUR, LABBENZ, AMPHETMU, THCU, LABBARB  Alcohol Level No results found for: [place]  CT Head without contrast(personally reviewed): CTH was negative for a large hypodensity concerning for a large territory infarct or hyperdensity concerning for an ICH  MRI Brain: pending  Impression   [name] is a 60 y.o. male with PMH significant for seizures, HFrEF due to ischemic cardiomyopathy, non-small cell carcinoma of the right lung- stage III, hyperlipidemia, peripheral arterial disease s/p right BKA who was admitted to [facility] after cardiac arrest. Following commands and was ready for discharge when he had a 30sec seizure followed by post ictal confusion. He is now back to his baseline with no focal deficit.  I discussed with his about starting him on AEDs but he is adamant that he is fine and it is no big deal and he does not want to take any AEDs. He does not give a particular reason for his refusal except that he does not think he needs it. I discussed long term neurological effects of seizures including memory issues, dementia, SUDEP and strongly recommended AEDs. I also discussed that he should not drive or use heavy machinery. He has to be seizure free for more than 6 months before he can resume  those.  Impression: Breakthrough Seizure Non compliance with AEDs.  Recommendations  - MRI Brain with and without contrast to evaluate for intracranial mets. - I do not think rEEG would change management unless he agrees to taking antiseizure medications. - Follow up with Dr. Alfredia Ferguson. - Full seizure precautions listed below. ______________________________________________________________________   Thank you for the opportunity to take part in  the care of this patient. If you have any further questions, please contact the neurology consultation attending.  Signed,  Donnetta Simpers [facility] Pager Number [phone] _ _ _   _ __   _ __ _ _  __ __   _ __   __ _   Seizure precautions: Per [facility] statutes, patients with seizures are not allowed to drive until they have been seizure-free for six months and cleared by a physician    Use caution when using heavy equipment or power tools. Avoid working on ladders or at heights. Take showers instead of baths. Ensure the water temperature is not too high on the home water heater. Do not go swimming alone. Do not lock yourself in a room alone (i.e. bathroom). When caring for infants or small children, sit down when holding, feeding, or changing them to minimize risk of injury to the child in the event you have a seizure. Maintain good sleep hygiene. Avoid alcohol.    If patient has another seizure, call 911 and bring them back to the ED if: A.  The seizure lasts longer than 5 minutes.      B.  The patient doesn't wake shortly after the seizure or has new problems such as difficulty seeing, speaking or moving following the seizure C.  The patient was injured during the seizure D.  The patient has a temperature over 102 F (39C) E.  The patient vomited during the seizure and now is having trouble breathing    During the Seizure   - First, ensure adequate ventilation and place patients on the floor on their left side   Loosen clothing around the neck and ensure the airway is patent. If the patient is clenching the teeth, do not force the mouth open with any object as this can cause severe damage - Remove all items from the surrounding that can be hazardous. The patient may be oblivious to what's happening and may not even know what he or she is doing. If the patient is confused and wandering, either gently guide him/her away and block access to outside areas - Reassure the individual and be comforting - Call 911. In most cases, the seizure ends before EMS arrives. However, there are cases when seizures may last over 3 to 5 minutes. Or the individual may have developed breathing difficulties or severe injuries. If a pregnant patient or a person with diabetes develops a seizure, it is prudent to call an ambulance. - Finally, if the patient does not regain full consciousness, then call EMS. Most patients will remain confused for about 45 to 90 minutes after a seizure, so you must use judgment in calling for help. - Avoid restraints but make sure the patient is in a bed with padded side rails - Place the individual in a lateral position with the neck slightly flexed; this will help the saliva drain from the mouth and prevent the tongue from falling backward - Remove all nearby furniture and other hazards from the area - Provide verbal assurance as the individual is regaining consciousness - Provide the patient with privacy if possible - Call for help and start treatment as ordered by the caregiver    After the Seizure (Postictal Stage)   After a seizure, most patients experience confusion, fatigue, muscle pain and/or a headache. Thus, one should permit the individual to sleep. For the next few days, reassurance is essential. Being calm and helping reorient the person is also of importance.  Most seizures are painless and end spontaneously. Seizures are not harmful to others but can lead to complications such as  stress on the lungs, brain and the heart. Individuals with prior lung problems may develop labored breathing and respiratory distress.

## 2021-03-28 NOTE — Plan of Care (Addendum)
Patient initially refusing treatment and the consideration for antiepileptics.  Presumably had another seizure on the floor today after which he has decided to cooperate with the investigations as well as possible treatments. He will get an MRI brain w+w/o which has been reordered (but I am informed may not happen until Tuesday because of the long weekend and unavailability of a [employer] rep/cardiology logistics), and EEG will be done tomorrow and I will follow-up with further recommendations. Continue Keppra for now Discussed with Dr. Sloan Leiter -- Amie [place], MD Neurologist [facility] Pager: [phone]

## 2021-03-28 NOTE — Plan of Care (Signed)

## 2021-03-28 NOTE — Progress Notes (Signed)
PROGRESS NOTE    [name]  WUX:[id number] DOB: 07-14-1961 DOA: 03/24/2021 PCP: Redmond School, MD     Brief Narrative:  [name] is a 60 year old male with a history of HFrEF due to ICM, stage IIIb lung cancer on treatment, PAD s/p R BKA  who presented after being found down in the bathroom at chemo. When he was found, he was pulseless and CPR was started. ROSC within about 10 min. Intubated by anesthesia during the code.  In the ED-he was found to be hypotensive requiring norepinephrine.  Per prior notes-he has had hypotension at other appointments recently, and patient had not been eating well due to dysgeusa. Patient was admitted to the ICU on 6/28 after cardiac arrest, suspected PEA due to hypotension.  Once he was stabilized-extubated-he was then transferred to [facility] on 6/30.  Further hospital course complicated by an episode of seizure-requiring transfer to [facility] for neurology/seizure evaluation.  Subjective: Awake/alert-inquiring about discharge.  He was evaluated by neurology last night-refused initiation of antiepileptics.  I spoke to him at length this morning-he continues to refuse antiepileptics-understands the risk of status epilepticus-and and its life-threatening disability and even death.  Assessment & Plan: PEA arrest: Felt to be due to hypotension associated with CHF medications and poor intake.  Briefly required pressors while in the ICU.  Remains on midodrine-all CHF medications currently on hold.  Breakthrough seizures: Noncompliant with seizure medications in the past-evaluated by neurology this admission-has refused long-term antiepileptics.  This MD again had a long discussion with patient-continues to refuse antiepileptics-aware of the life-threatening/life disabling risk of status epilepticus.  Await MRI.-Agree with neurology that obtaining EEG will not change management outcome given patient's refusal of antiepileptics.  Continue with IV Keppra while inpatient-if patient  allows.  COPD exacerbation: Improved-lungs clear on exam-continue tapering prednisone/Zithromax and bronchodilators.  HFrEF (EF 25-30% by TTE on 6/28) s/p AICD in place: Volume status stable-no longer on GDMT due to hypotension.  BP stable on midodrine.  CAD s/p PCI 05/26/2016: No anginal symptoms-on aspirin/statin.  Hypothyroidism: Continue Synthroid-TSH remains mildly elevated-stable to continue Synthroid and follow-up with PCP for dose adjustment.  BPH: Continue Flomax  History of 3B non-small cell cancer of the lung: Resume outpatient follow-up with Dr. 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  Continue periodic monitoring.  Right BKA  Sepsis ruled out -Presented with WBC 29, HR 72, RR 17, temperature 97.8 -Procalcitonin remains negative, lactic acid 1.9 -Blood cultures negative to date -Respiratory culture with normal respiratory flora -No source of infection found   DVT prophylaxis:  enoxaparin (LOVENOX) injection 40 mg Start: 03/24/21 2200 SCDs Start: 03/24/21 1400  Code Status:     Code Status Orders  (From admission, onward)           Start     Ordered   03/24/21 1445  Do not attempt resuscitation (DNR)  Continuous       Question Answer Comment  In the event of cardiac or respiratory ARREST Do not call a "code blue"   In the event of cardiac or respiratory ARREST Do not perform Intubation, CPR, defibrillation or ACLS   In the event of cardiac or respiratory ARREST Use medication by any route, position, wound care, and other measures to relive pain and suffering. May use oxygen, suction and manual treatment of airway obstruction as needed for comfort.      03/24/21  1444           Code Status History     Date Active Date Inactive Code Status Order ID Comments User Context   03/24/2021 1403 03/24/2021 1444  Full Code [id number]  Julian Hy, DO ED   03/02/2018 1039 03/03/2018 1950 Full Code [id number]  [name], MD Inpatient   05/26/2016 0344 05/28/2016 1828 Full Code [id number]  [name], MD Inpatient   05/26/2016 0344 05/26/2016 0344 Full Code [id number]  Corotto, Sammuel Hines, MD Inpatient      Advance Directive Documentation    Flowsheet Row Most Recent Value  Type of Advance Directive Living will  [per computer notes]  Pre-existing out of facility DNR order (yellow form or pink MOST form) --  "MOST" Form in Place? --      Family Communication: Called spouse- [phone] on 7/2-but got patient's daughter on the line-family asked me to talk to her.  She was updated extensively.  Disposition Plan:  Status is: Inpatient  Remains inpatient appropriate because:Hemodynamically unstable  Dispo: The patient is from: Home              Anticipated d/c is to: Home              Patient currently is not medically stable to d/c.   Difficult to place patient No   Antimicrobials:  Anti-infectives (From admission, onward)    Start     Dose/Rate Route Frequency Ordered Stop   03/25/21 1800  azithromycin (ZITHROMAX) tablet 500 mg        500 mg Oral Daily-1800 03/25/21 0814     03/25/21 0400  vancomycin (VANCOREADY) IVPB 1500 mg/300 mL  Status:  Discontinued        1,500 mg 150 mL/hr over 120 Minutes Intravenous Every 12 hours 03/24/21 1430 03/25/21 0805   03/25/21 0000  ceFEPIme (MAXIPIME) 2 g in sodium chloride 0.9 % 100 mL IVPB  Status:  Discontinued        2 g 200 mL/hr over 30 Minutes Intravenous Every 8 hours 03/24/21 1430 03/26/21 1535   03/24/21 1800  azithromycin (ZITHROMAX) 500 mg in sodium chloride 0.9 % 250 mL IVPB  Status:  Discontinued        500 mg 250 mL/hr over 60 Minutes Intravenous Every 24 hours 03/24/21 1520 03/25/21 0814   03/24/21 1445  vancomycin (VANCOREADY) IVPB 2000 mg/400 mL        2,000 mg 200 mL/hr over 120 Minutes Intravenous  Once 03/24/21 1430  03/24/21 1939   03/24/21 1445  ceFEPIme (MAXIPIME) 2 g in sodium chloride 0.9 % 100 mL IVPB        2 g 200 mL/hr over 30 Minutes Intravenous  Once 03/24/21 1430 03/24/21 1705        Objective: Vitals:   03/27/21 2333 03/28/21 0009 03/28/21 0412 03/28/21 0744  BP: 106/84 118/75 112/75 106/73  Pulse: 88 88 92 97  Resp: 16 20 18 17   Temp:  98.5 F (36.9 C) 98.5 F (36.9 C) 98.9 F (37.2 C)  TempSrc:  Oral Oral Oral  SpO2: 100% 100% 99% 100%  Weight:      Height:        Intake/Output Summary (Last 24 hours) at 03/28/2021 1116 Last data filed at 03/28/2021 0515 Gross per 24 hour  Intake 375.5 ml  Output 1100 ml  Net -724.5 ml    Filed Weights   03/24/21 1421 03/27/21 0443  Weight: 94 kg 91.1 kg  Gen Exam:Alert awake-not in any distress HEENT:atraumatic, normocephalic Chest: B/L clear to auscultation anteriorly CVS:S1S2 regular Abdomen:soft non tender, non distended Extremities:no edema-right BKA Neurology: Non focal Skin: no rash   Data Reviewed: I have personally reviewed following labs and imaging studies  CBC: Recent Labs  Lab 03/24/21 0938 03/24/21 1337 03/24/21 1612 03/25/21 0648 03/26/21 0431 03/27/21 0415  WBC 17.3* 26.0* 29.0* 17.4* 13.1* 8.2  NEUTROABS 14.5* 20.5*  --   --   --   --   HGB 9.5* 9.4* 9.1* 8.1* 8.3* 8.7*  HCT 28.8* 30.2* 28.6* 25.4* 26.1* 27.6*  MCV 80.0 83.4 82.7 83.0 83.4 82.4  PLT 318 467* 306 226 242 607    Basic Metabolic Panel: Recent Labs  Lab 03/24/21 0938 03/24/21 1337 03/24/21 1612 03/25/21 0648 03/26/21 0431 03/27/21 0415  NA 130* 131*  --  132* 131* 130*  K 4.3 4.0  --  4.3 4.0 4.1  CL 91* 96*  --  95* 97* 95*  CO2 28 27  --  28 28 28   GLUCOSE 139* 241*  --  121* 117* 111*  BUN 9 10  --  11 17 24*  CREATININE 0.84 0.91 0.89 0.80 0.84 0.84  CALCIUM 8.7* 7.8*  --  7.9* 8.1* 8.5*  MG  --  2.1  --  1.8  --   --   PHOS  --   --   --  3.9  --   --     GFR: Estimated Creatinine Clearance: 105.2 mL/min (by C-G  formula based on SCr of 0.84 mg/dL). Liver Function Tests: Recent Labs  Lab 03/24/21 0938 03/24/21 1337  AST 41 43*  ALT 42 42  ALKPHOS 136* 121  BILITOT 0.3 0.7  PROT 6.6 6.2*  ALBUMIN 2.6* 2.8*    No results for input(s): LIPASE, AMYLASE in the last 168 hours. No results for input(s): AMMONIA in the last 168 hours. Coagulation Profile: Recent Labs  Lab 03/24/21 1611  INR 1.0    Cardiac Enzymes: No results for input(s): CKTOTAL, CKMB, CKMBINDEX, TROPONINI in the last 168 hours. BNP (last 3 results) No results for input(s): PROBNP in the last 8760 hours. HbA1C: No results for input(s): HGBA1C in the last 72 hours. CBG: Recent Labs  Lab 03/27/21 1152 03/27/21 1541 03/27/21 1843 03/27/21 2104 03/28/21 0630  GLUCAP 142* 159* 141* 150* 135*    Lipid Profile: No results for input(s): CHOL, HDL, LDLCALC, TRIG, CHOLHDL, LDLDIRECT in the last 72 hours. Thyroid Function Tests: No results for input(s): TSH, T4TOTAL, FREET4, T3FREE, THYROIDAB in the last 72 hours.  Anemia Panel: No results for input(s): VITAMINB12, FOLATE, FERRITIN, TIBC, IRON, RETICCTPCT in the last 72 hours. Sepsis Labs: Recent Labs  Lab 03/24/21 1611 03/24/21 1612 03/25/21 0648 03/26/21 0431  PROCALCITON  --  <0.10 <0.10 <0.10  LATICACIDVEN 1.9  --   --   --      Recent Results (from the past 240 hour(s))  Resp Panel by RT-PCR (Flu A&B, Covid) Nasopharyngeal Swab     Status: None   Collection Time: 03/24/21  2:17 PM   Specimen: Nasopharyngeal Swab; Nasopharyngeal(NP) swabs in vial transport medium  Result Value Ref Range Status   SARS Coronavirus 2 by RT PCR NEGATIVE NEGATIVE Final    Comment: (NOTE) SARS-CoV-2 target nucleic acids are NOT DETECTED.  The SARS-CoV-2 RNA is generally detectable in upper respiratory specimens during the acute phase of infection. The lowest concentration of SARS-CoV-2 viral copies this assay can detect is 138 copies/mL.  A negative result does not preclude  SARS-Cov-2 infection and should not be used as the sole basis for treatment or other patient management decisions. A negative result may occur with  improper specimen collection/handling, submission of specimen other than nasopharyngeal swab, presence of viral mutation(s) within the areas targeted by this assay, and inadequate number of viral copies(<138 copies/mL). A negative result must be combined with clinical observations, patient history, and epidemiological information. The expected result is Negative.  Fact Sheet for Patients:  EntrepreneurPulse.com.au  Fact Sheet for Healthcare Providers:  IncredibleEmployment.be  This test is no t yet approved or cleared by the [place] FDA and  has been authorized for detection and/or diagnosis of SARS-CoV-2 by FDA under an Emergency Use Authorization (EUA). This EUA will remain  in effect (meaning this test can be used) for the duration of the COVID-19 declaration under Section 564(b)(1) of the Act, 21 U.S.C.section 360bbb-3(b)(1), unless the authorization is terminated  or revoked sooner.       Influenza A by PCR NEGATIVE NEGATIVE Final   Influenza B by PCR NEGATIVE NEGATIVE Final    Comment: (NOTE) The Xpert Xpress SARS-CoV-2/FLU/RSV plus assay is intended as an aid in the diagnosis of influenza from Nasopharyngeal swab specimens and should not be used as a sole basis for treatment. Nasal washings and aspirates are unacceptable for Xpert Xpress SARS-CoV-2/FLU/RSV testing.  Fact Sheet for Patients: EntrepreneurPulse.com.au  Fact Sheet for Healthcare Providers: IncredibleEmployment.be  This test is not yet approved or cleared by the [place] FDA and has been authorized for detection and/or diagnosis of SARS-CoV-2 by FDA under an Emergency Use Authorization (EUA). This EUA will remain in effect (meaning this test can be used) for the duration of  the COVID-19 declaration under Section 564(b)(1) of the Act, 21 U.S.C. section 360bbb-3(b)(1), unless the authorization is terminated or revoked.  Performed at [facility], [place] [address], [place], [place] [address]   MRSA PCR Screening     Status: None   Collection Time: 03/24/21  3:18 PM  Result Value Ref Range Status   MRSA by PCR NEGATIVE NEGATIVE Final    Comment:        The GeneXpert MRSA Assay (FDA approved for NASAL specimens only), is one component of a comprehensive MRSA colonization surveillance program. It is not intended to diagnose MRSA infection nor to guide or monitor treatment for MRSA infections. Performed at [facility], [place] [address] [place], [place] [address]   Culture, Respiratory w Gram Stain     Status: None   Collection Time: 03/24/21  3:55 PM   Specimen: Tracheal Aspirate; Respiratory  Result Value Ref Range Status   Specimen Description   Final    TRACHEAL ASPIRATE Performed at [place] [address], [place], [place] [address]    Special Requests   Final    NONE Performed at [facility], [place] [address], [place], [place] [address]    Gram Stain   Final    MODERATE WBC PRESENT,BOTH PMN AND MONONUCLEAR FEW GRAM POSITIVE COCCI IN CHAINS FEW GRAM POSITIVE RODS    Culture   Final    Normal respiratory flora-no Staph aureus or Pseudomonas seen Performed at [facility], [address] [place], [place] [address]    Report Status 03/27/2021 FINAL  Final  Culture, blood (routine x 2)     Status: None (Preliminary result)   Collection Time: 03/24/21  4:09 PM   Specimen:  BLOOD  Result Value Ref Range Status   Specimen Description   Final    BLOOD RIGHT ANTECUBITAL Performed at [place] [address], [place], [place] [address]    Special Requests   Final    BOTTLES DRAWN AEROBIC ONLY Blood Culture adequate volume Performed at  [place] [address], [place], [place] [address]    Culture   Final    NO GROWTH 4 DAYS Performed at [facility], [place] [address], [place], [place] [address]    Report Status PENDING  Incomplete  Culture, blood (routine x 2)     Status: None (Preliminary result)   Collection Time: 03/24/21  4:10 PM   Specimen: BLOOD  Result Value Ref Range Status   Specimen Description   Final    BLOOD LEFT ANTECUBITAL Performed at [place] [address], [place], [place] [address]    Special Requests   Final    BOTTLES DRAWN AEROBIC ONLY Blood Culture results may not be optimal due to an inadequate volume of blood received in culture bottles Performed at [place] [address], [place], [place] [address]    Culture   Final    NO GROWTH 4 DAYS Performed at [facility], [place] [address], [place], [place] [address]    Report Status PENDING  Incomplete       Radiology Studies: CT HEAD WO CONTRAST  Result Date: 03/27/2021 CLINICAL DATA:  Seizure EXAM: CT HEAD WITHOUT CONTRAST TECHNIQUE: Contiguous axial images were obtained from the base of the skull through the vertex without intravenous contrast. COMPARISON:  MRI 12/17/2020, CT brain 06/28/2018 FINDINGS: Brain: No acute territorial infarction, hemorrhage, or intracranial mass. The ventricles are nonenlarged. Mild atrophy. Vascular: No hyperdense vessels.  No unexpected calcification Skull: Normal. Negative for fracture or focal lesion. Sinuses/Orbits: No acute finding. Other: None IMPRESSION: Negative non contrasted CT appearance of the brain Electronically Signed   By: Donavan Foil M.D.   On: 03/27/2021 18:42      Scheduled Meds:  (feeding supplement) PROSource Plus  30 mL Oral BID BM   amitriptyline  100 mg Oral QHS   aspirin EC  81 mg Oral Daily   atorvastatin  80 mg Oral Daily   azithromycin  500 mg Oral q1800   Chlorhexidine Gluconate Cloth  6 each  Topical Daily   citalopram  40 mg Oral Daily   enoxaparin (LOVENOX) injection  40 mg Subcutaneous Q24H   feeding supplement  1 Container Oral BID BM   folic acid  1 mg Oral Daily   insulin aspart  0-15 Units Subcutaneous TID WC   levothyroxine  125 mcg Oral Q0600   mouth rinse  15 mL Mouth Rinse BID   midodrine  10 mg Oral TID WC   multivitamin with minerals  1 tablet Oral Daily   predniSONE  40 mg Oral Q breakfast   Continuous Infusions:  levETIRAcetam 1,000 mg (03/28/21 0924)      LOS: 4 days      Time spent: 45 minutes   Graciano Batson, DO [facility] 03/28/2021, 11:16 AM   Available via Epic secure chat 7am-7pm After these hours, please refer to coverage provider listed on [email or web address]

## 2021-03-28 NOTE — Progress Notes (Signed)
Patient was in the toilet to move bowel. His wife was standing outside the toilet waiting for him to come out. Wife states she opened the door and saw patient head between the wall and the commode flush. Staff immediately rushed to room when wife called for help. MD notified,  MD was in the room to see patient, and he ordered CAT scan and EEG. Patient did not sustain any injury at this time. Vital signs were obtained according to unit and hospital protocol. Will continue to monitor and support patient and family.

## 2021-03-28 NOTE — Progress Notes (Signed)
Physical Therapy Treatment Patient Details Name: [name] MRN: [id number] DOB: August 23, 1961 Today's Date: 03/28/2021    History of Present Illness Patient is 60 y.o. male who was found down at cancer center during chemo for Stage IIIb non-small cell lung cancer. Code Blue was initiated and CPR was started. ROSC within about 10 min. Intubated by anesthesia during the code. He is waking up in he ED but has required norepinephrine for hypotension despite 1L volume resuscitation. He had a low BP 76/59.  Patient was admitted to the ICU on 6/28 after cardiac arrest, suspected PEA due to hypotension.  Patient had been on cardiac medications for his underlying ICM. Patient with seizure like activity with RN present on 7/1. Transferred to [facility]. PMH significant for STEMI in 2017 with ICD, COPD, PAD, Rt BKA since 2009, asthma, depression, anxiety, DVT, seizures    PT Comments    Upon arrival, RN alerted to PT that patient on floor in bathroom. Assisted patient with floor transfer with modA. Per wife, patient with seizure like activity. Patient ambulated with RW to bed with minA for balance and RW management. Educated patient on increased fall risk, need for assistance for mobility, and safety, patient verbalized understanding. Patient with poor safety awareness and poor awareness of deficits. D/c plan continues to remain appropriate.    Follow Up Recommendations  Home health PT;Supervision for mobility/OOB     Equipment Recommendations  None recommended by PT    Recommendations for Other Services       Precautions / Restrictions Precautions Precautions: Fall Precaution Comments: Rt BKA (has prosthesis in room), fall in room on 7/2 Restrictions Weight Bearing Restrictions: No    Mobility  Bed Mobility               General bed mobility comments: in floor in bathroom on arrival    Transfers Overall transfer level: Needs assistance Equipment used: 1 person hand held assist Transfers:  Sit to/from Stand Sit to Stand: Mod assist         General transfer comment: modA to stand from floor and steady upon standing. Provided RW for support  Ambulation/Gait Ambulation/Gait assistance: Min assist Gait Distance (Feet): 12 Feet Assistive device: Rolling Kylle Lall (2 wheeled) Gait Pattern/deviations: Step-through pattern;Decreased step length - right;Decreased step length - left;Decreased stride length Gait velocity: decreased   General Gait Details: MinA for balance and cues required for RW management.   Stairs             Wheelchair Mobility    Modified Rankin (Stroke Patients Only)       Balance Overall balance assessment: Needs assistance Sitting-balance support: Feet supported Sitting balance-Leahy Scale: Good     Standing balance support: During functional activity;Bilateral upper extremity supported Standing balance-Leahy Scale: Poor Standing balance comment: reliant on external support                            Cognition Arousal/Alertness: Awake/alert Behavior During Therapy: WFL for tasks assessed/performed Overall Cognitive Status: Impaired/Different from baseline Area of Impairment: Attention;Memory;Following commands;Safety/judgement;Awareness;Problem solving                   Current Attention Level: Sustained Memory: Decreased short-term memory Following Commands: Follows one step commands inconsistently;Follows multi-step commands inconsistently Safety/Judgement: Decreased awareness of safety;Decreased awareness of deficits Awareness: Emergent Problem Solving: Difficulty sequencing;Requires verbal cues;Requires tactile cues General Comments: Patient found down in bathroom with wife present. 2-3 RNs and 1  NT present. Poor safety awareness and need for assistance to stand. Per wife, patient lying between toilet and wall with seizure like activity. Not following commands, possibly due to personality.      Exercises       General Comments General comments (skin integrity, edema, etc.): Alerted by nursing staff that patient on floor in bathroom upon arrival to room for treatment session. PT assisted patient with floor transfer with gait belt and 2-3 RNs present and 1 NT present. Skin tear noted near R elbow, RN notified. Wife noted seizure like activity while patient on floor prior to calling for help. HR max 149 noted following activity and seated rest break      Pertinent Vitals/Pain Pain Assessment: No/denies pain    Home Living                      Prior Function            PT Goals (current goals can now be found in the care plan section) Acute Rehab PT Goals Patient Stated Goal: to go home PT Goal Formulation: With patient Time For Goal Achievement: 04/10/21 Potential to Achieve Goals: Good Progress towards PT goals: Progressing toward goals    Frequency    Min 3X/week      PT Plan Current plan remains appropriate    Co-evaluation              AM-PAC PT "6 Clicks" Mobility   Outcome Measure  Help needed turning from your back to your side while in a flat bed without using bedrails?: A Little Help needed moving from lying on your back to sitting on the side of a flat bed without using bedrails?: A Little Help needed moving to and from a bed to a chair (including a wheelchair)?: A Lot Help needed standing up from a chair using your arms (e.g., wheelchair or bedside chair)?: A Lot Help needed to walk in hospital room?: A Little Help needed climbing 3-5 steps with a railing? : A Lot 6 Click Score: 15    End of Session Equipment Utilized During Treatment: Gait belt Activity Tolerance: Patient tolerated treatment well Patient left:  (seated EOB with nursing staff present) Nurse Communication: Mobility status PT Visit Diagnosis: Muscle weakness (generalized) (M62.81);Difficulty in walking, not elsewhere classified (R26.2)     Time: 7106-2694 PT Time Calculation  (min) (ACUTE ONLY): 10 min  Charges:  $Therapeutic Activity: 8-22 mins                     Jovi Alvizo A. Gilford Rile PT, DPT Acute Rehabilitation Services Pager [phone] Office [phone]    Linna Hoff 03/28/2021, 3:36 PM

## 2021-03-29 ENCOUNTER — Inpatient Hospital Stay (HOSPITAL_COMMUNITY): Payer: Medicare Other

## 2021-03-29 LAB — CULTURE, BLOOD (ROUTINE X 2)
Culture: NO GROWTH
Culture: NO GROWTH
Special Requests: ADEQUATE

## 2021-03-29 LAB — GLUCOSE, CAPILLARY
Glucose-Capillary: 122 mg/dL — ABNORMAL HIGH (ref 70–99)
Glucose-Capillary: 124 mg/dL — ABNORMAL HIGH (ref 70–99)

## 2021-03-29 MED ORDER — SODIUM CHLORIDE 0.9 % IV SOLN
200.0000 mg | Freq: Once | INTRAVENOUS | Status: AC
  Administered 2021-03-29: 200 mg via INTRAVENOUS
  Filled 2021-03-29: qty 20

## 2021-03-29 MED ORDER — MIDODRINE HCL 10 MG PO TABS: 10.0000 mg | ORAL_TABLET | Freq: Three times a day (TID) | ORAL | 1 refills | Status: DC

## 2021-03-29 MED ORDER — PREDNISONE 10 MG PO TABS: ORAL_TABLET | ORAL | 0 refills | Status: DC

## 2021-03-29 MED ORDER — PROSOURCE PLUS PO LIQD: 30.0000 mL | Freq: Two times a day (BID) | ORAL | 0 refills | Status: AC

## 2021-03-29 MED ORDER — PREDNISONE 20 MG PO TABS: 20.0000 mg | ORAL_TABLET | Freq: Every day | ORAL | Status: DC

## 2021-03-29 MED ORDER — PROSOURCE PLUS PO LIQD: 30.0000 mL | Freq: Two times a day (BID) | ORAL | 0 refills | Status: DC

## 2021-03-29 MED ORDER — LACOSAMIDE 50 MG PO TABS: 50.0000 mg | ORAL_TABLET | Freq: Two times a day (BID) | ORAL | Status: DC

## 2021-03-29 MED ORDER — LACOSAMIDE 50 MG PO TABS: 50.0000 mg | ORAL_TABLET | Freq: Two times a day (BID) | ORAL | 0 refills | Status: DC

## 2021-03-29 NOTE — Progress Notes (Signed)
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 When he was found, he was pulseless and CPR was started. ROSC within about 10 min. Intubated by anesthesia during the code.  In the ED-he was found to be hypotensive requiring norepinephrine.  Per prior notes-he has had hypotension at other appointments recently, and patient had not been eating well due to dysgeusa. Patient was admitted to the ICU on 6/28 after cardiac arrest, suspected PEA due to hypotension.  Once he was stabilized-extubated-he was then transferred to [facility] on 6/30.  Further hospital course complicated by an episode of seizure-requiring transfer to [facility] for neurology/seizure evaluation.  Significant events: 6/20>> PEA arrest-admit to ICU 6/30>> transfer to [facility] 7/1>> seizure episode-transfer to [facility] for neuro eval 7/2>> another episode of seizure while at [facility].   Subjective: Awake/alert-no further seizures since yesterday afternoon.  Assessment & Plan: PEA arrest: Felt to be due to hypotension associated with CHF medications and poor intake.  Briefly required pressors while in the ICU.  Remains on midodrine-all CHF medications currently on hold.  Breakthrough seizures: Noncompliant with seizure medications in the past-initially refused to be placed on long-term antiepileptics-hence it was felt that further work-up would not be beneficial.  However patient had another episode of seizure on 7/2-subsequently after discussion with patient/family-he agreed to be placed on long-term antiepileptics-awaiting MRI brain-EEG negative for seizures.  Will await further recommendations from neurology-remains on Keppra.   COPD exacerbation: Improved-lungs remain clear on exam-continue to  taper prednisone-on Zithromax x5 days.  Remains on bronchodilators.    HFrEF (EF 25-30% by TTE on 6/28) s/p AICD in place: Volume status stable-no longer on GDMT due to hypotension.  BP stable on midodrine.  CAD s/p PCI 05/26/2016: No anginal symptoms-on aspirin/statin.  Hypothyroidism: Continue Synthroid-TSH remains mildly elevated-stable to continue Synthroid and follow-up with PCP for dose adjustment.  BPH: Continue Flomax  History of 3B non-small cell cancer of the lung: Resume outpatient follow-up with Dr. 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  Continue periodic monitoring.  Right BKA  Sepsis ruled out -Presented with WBC 29, HR 72, RR 17, temperature 97.8 -Procalcitonin remains negative, lactic acid 1.9 -Blood cultures negative to date -Respiratory culture with normal respiratory flora -No source of infection found   DVT prophylaxis:  enoxaparin (LOVENOX) injection 40 mg Start: 03/24/21 2200 SCDs Start: 03/24/21 1400  Code Status:     Code Status Orders  (From admission, onward)           Start     Ordered   03/24/21 1445  Do not attempt resuscitation (DNR)  Continuous       Question Answer Comment  In the event of cardiac or respiratory ARREST Do not call a "code blue"   In the event of cardiac or respiratory ARREST Do not perform Intubation, CPR, defibrillation or ACLS   In the event of cardiac or respiratory ARREST Use medication by any route, position, wound care, and other measures to relive pain and suffering. May use oxygen, suction and manual treatment of airway obstruction as needed for comfort.  03/24/21 1444           Code Status History     Date Active Date Inactive Code Status Order ID Comments User Context   03/24/2021 1403 03/24/2021 1444 Full Code [id number]  Julian Hy, DO ED    03/02/2018 1039 03/03/2018 1950 Full Code [id number]  [name], MD Inpatient   05/26/2016 0344 05/28/2016 1828 Full Code [id number]  [name], MD Inpatient   05/26/2016 0344 05/26/2016 0344 Full Code [id number]  Corotto, Sammuel Hines, MD Inpatient      Advance Directive Documentation    Flowsheet Row Most Recent Value  Type of Advance Directive Living will  [per computer notes]  Pre-existing out of facility DNR order (yellow form or pink MOST form) --  "MOST" Form in Place? --      Family Communication: None at bedside-had spoken with both spouse [phone]) and daughter on 7/2.  Will update family over the next few days.    Disposition Plan:  Status is: Inpatient  Remains inpatient appropriate because:Hemodynamically unstable  Dispo: The patient is from: Home              Anticipated d/c is to: Home              Patient currently is not medically stable to d/c.   Difficult to place patient No   Antimicrobials:  Anti-infectives (From admission, onward)    Start     Dose/Rate Route Frequency Ordered Stop   03/25/21 1800  azithromycin (ZITHROMAX) tablet 500 mg        500 mg Oral Daily-1800 03/25/21 0814 03/30/21 1759   03/25/21 0400  vancomycin (VANCOREADY) IVPB 1500 mg/300 mL  Status:  Discontinued        1,500 mg 150 mL/hr over 120 Minutes Intravenous Every 12 hours 03/24/21 1430 03/25/21 0805   03/25/21 0000  ceFEPIme (MAXIPIME) 2 g in sodium chloride 0.9 % 100 mL IVPB  Status:  Discontinued        2 g 200 mL/hr over 30 Minutes Intravenous Every 8 hours 03/24/21 1430 03/26/21 1535   03/24/21 1800  azithromycin (ZITHROMAX) 500 mg in sodium chloride 0.9 % 250 mL IVPB  Status:  Discontinued        500 mg 250 mL/hr over 60 Minutes Intravenous Every 24 hours 03/24/21 1520 03/25/21 0814   03/24/21 1445  vancomycin (VANCOREADY) IVPB 2000 mg/400 mL        2,000 mg 200 mL/hr over 120 Minutes Intravenous  Once 03/24/21 1430 03/24/21 1939   03/24/21 1445  ceFEPIme  (MAXIPIME) 2 g in sodium chloride 0.9 % 100 mL IVPB        2 g 200 mL/hr over 30 Minutes Intravenous  Once 03/24/21 1430 03/24/21 1705        Objective: Vitals:   03/28/21 1947 03/28/21 2358 03/29/21 0349 03/29/21 1010  BP: 115/75 (!) 85/63 99/63 106/77  Pulse: 96 90 93 98  Resp: 17 19 19    Temp: 99.4 F (37.4 C) 98.5 F (36.9 C) 98.9 F (37.2 C) 98.9 F (37.2 C)  TempSrc: Oral Axillary Oral Oral  SpO2: 98% 98% 100% 97%  Weight:      Height:        Intake/Output Summary (Last 24 hours) at 03/29/2021 1117 Last data filed at 03/29/2021 0100 Gross per 24 hour  Intake 200 ml  Output 300 ml  Net -100 ml    Filed Weights   03/24/21 1421 03/27/21 0443  Weight:  94 kg 91.1 kg   Gen Exam:Alert awake-not in any distress HEENT:atraumatic, normocephalic Chest: B/L clear to auscultation anteriorly CVS:S1S2 regular Abdomen:soft non tender, non distended Extremities: Right BKA Neurology: Non focal Skin: no rash   Data Reviewed: I have personally reviewed following labs and imaging studies  CBC: Recent Labs  Lab 03/24/21 0938 03/24/21 1337 03/24/21 1612 03/25/21 0648 03/26/21 0431 03/27/21 0415  WBC 17.3* 26.0* 29.0* 17.4* 13.1* 8.2  NEUTROABS 14.5* 20.5*  --   --   --   --   HGB 9.5* 9.4* 9.1* 8.1* 8.3* 8.7*  HCT 28.8* 30.2* 28.6* 25.4* 26.1* 27.6*  MCV 80.0 83.4 82.7 83.0 83.4 82.4  PLT 318 467* 306 226 242 283    Basic Metabolic Panel: Recent Labs  Lab 03/24/21 0938 03/24/21 1337 03/24/21 1612 03/25/21 0648 03/26/21 0431 03/27/21 0415  NA 130* 131*  --  132* 131* 130*  K 4.3 4.0  --  4.3 4.0 4.1  CL 91* 96*  --  95* 97* 95*  CO2 28 27  --  28 28 28   GLUCOSE 139* 241*  --  121* 117* 111*  BUN 9 10  --  11 17 24*  CREATININE 0.84 0.91 0.89 0.80 0.84 0.84  CALCIUM 8.7* 7.8*  --  7.9* 8.1* 8.5*  MG  --  2.1  --  1.8  --   --   PHOS  --   --   --  3.9  --   --     GFR: Estimated Creatinine Clearance: 105.2 mL/min (by C-G formula based on SCr of 0.84  mg/dL). Liver Function Tests: Recent Labs  Lab 03/24/21 0938 03/24/21 1337  AST 41 43*  ALT 42 42  ALKPHOS 136* 121  BILITOT 0.3 0.7  PROT 6.6 6.2*  ALBUMIN 2.6* 2.8*    No results for input(s): LIPASE, AMYLASE in the last 168 hours. No results for input(s): AMMONIA in the last 168 hours. Coagulation Profile: Recent Labs  Lab 03/24/21 1611  INR 1.0    Cardiac Enzymes: No results for input(s): CKTOTAL, CKMB, CKMBINDEX, TROPONINI in the last 168 hours. BNP (last 3 results) No results for input(s): PROBNP in the last 8760 hours. HbA1C: No results for input(s): HGBA1C in the last 72 hours. CBG: Recent Labs  Lab 03/28/21 0630 03/28/21 1207 03/28/21 1543 03/28/21 2118 03/29/21 0606  GLUCAP 135* 168* 144* 148* 122*    Lipid Profile: No results for input(s): CHOL, HDL, LDLCALC, TRIG, CHOLHDL, LDLDIRECT in the last 72 hours. Thyroid Function Tests: No results for input(s): TSH, T4TOTAL, FREET4, T3FREE, THYROIDAB in the last 72 hours.  Anemia Panel: No results for input(s): VITAMINB12, FOLATE, FERRITIN, TIBC, IRON, RETICCTPCT in the last 72 hours. Sepsis Labs: Recent Labs  Lab 03/24/21 1611 03/24/21 1612 03/25/21 0648 03/26/21 0431  PROCALCITON  --  <0.10 <0.10 <0.10  LATICACIDVEN 1.9  --   --   --      Recent Results (from the past 240 hour(s))  Resp Panel by RT-PCR (Flu A&B, Covid) Nasopharyngeal Swab     Status: None   Collection Time: 03/24/21  2:17 PM   Specimen: Nasopharyngeal Swab; Nasopharyngeal(NP) swabs in vial transport medium  Result Value Ref Range Status   SARS Coronavirus 2 by RT PCR NEGATIVE NEGATIVE Final    Comment: (NOTE) SARS-CoV-2 target nucleic acids are NOT DETECTED.  The SARS-CoV-2 RNA is generally detectable in upper respiratory specimens during the acute phase of infection. The lowest concentration of SARS-CoV-2 viral copies this  assay can detect is 138 copies/mL. A negative result does not preclude SARS-Cov-2 infection and  should not be used as the sole basis for treatment or other patient management decisions. A negative result may occur with  improper specimen collection/handling, submission of specimen other than nasopharyngeal swab, presence of viral mutation(s) within the areas targeted by this assay, and inadequate number of viral copies(<138 copies/mL). A negative result must be combined with clinical observations, patient history, and epidemiological information. The expected result is Negative.  Fact Sheet for Patients:  EntrepreneurPulse.com.au  Fact Sheet for Healthcare Providers:  IncredibleEmployment.be  This test is no t yet approved or cleared by the [place] FDA and  has been authorized for detection and/or diagnosis of SARS-CoV-2 by FDA under an Emergency Use Authorization (EUA). This EUA will remain  in effect (meaning this test can be used) for the duration of the COVID-19 declaration under Section 564(b)(1) of the Act, 21 U.S.C.section 360bbb-3(b)(1), unless the authorization is terminated  or revoked sooner.       Influenza A by PCR NEGATIVE NEGATIVE Final   Influenza B by PCR NEGATIVE NEGATIVE Final    Comment: (NOTE) The Xpert Xpress SARS-CoV-2/FLU/RSV plus assay is intended as an aid in the diagnosis of influenza from Nasopharyngeal swab specimens and should not be used as a sole basis for treatment. Nasal washings and aspirates are unacceptable for Xpert Xpress SARS-CoV-2/FLU/RSV testing.  Fact Sheet for Patients: EntrepreneurPulse.com.au  Fact Sheet for Healthcare Providers: IncredibleEmployment.be  This test is not yet approved or cleared by the [place] FDA and has been authorized for detection and/or diagnosis of SARS-CoV-2 by FDA under an Emergency Use Authorization (EUA). This EUA will remain in effect (meaning this test can be used) for the duration of the COVID-19 declaration  under Section 564(b)(1) of the Act, 21 U.S.C. section 360bbb-3(b)(1), unless the authorization is terminated or revoked.  Performed at [facility], [place] [address] [place], [place] [address]   MRSA PCR Screening     Status: None   Collection Time: 03/24/21  3:18 PM  Result Value Ref Range Status   MRSA by PCR NEGATIVE NEGATIVE Final    Comment:        The GeneXpert MRSA Assay (FDA approved for NASAL specimens only), is one component of a comprehensive MRSA colonization surveillance program. It is not intended to diagnose MRSA infection nor to guide or monitor treatment for MRSA infections. Performed at [facility], [place] [address] [place], [place] [address]   Culture, Respiratory w Gram Stain     Status: None   Collection Time: 03/24/21  3:55 PM   Specimen: Tracheal Aspirate; Respiratory  Result Value Ref Range Status   Specimen Description   Final    TRACHEAL ASPIRATE Performed at [place] [address] [place], [place] [address]    Special Requests   Final    NONE Performed at [facility], [place] [address], [place], [place] [address]    Gram Stain   Final    MODERATE WBC PRESENT,BOTH PMN AND MONONUCLEAR FEW GRAM POSITIVE COCCI IN CHAINS FEW GRAM POSITIVE RODS    Culture   Final    Normal respiratory flora-no Staph aureus or Pseudomonas seen Performed at [facility], [address] [place],  [address]    Report Status 03/27/2021 FINAL  Final  Culture, blood (routine x 2)     Status: None   Collection Time: 03/24/21  4:09  PM   Specimen: BLOOD  Result Value Ref Range Status   Specimen Description   Final    BLOOD RIGHT ANTECUBITAL Performed at [place] [address], [place], [place] [address]    Special Requests   Final    BOTTLES DRAWN AEROBIC ONLY Blood Culture adequate volume Performed at [place] [address], [place], [place] [address]    Culture   Final    NO GROWTH 5 DAYS Performed at [facility], [place] [address], [place], [place] [address]    Report Status 03/29/2021 FINAL  Final  Culture, blood (routine x 2)     Status: None   Collection Time: 03/24/21  4:10 PM   Specimen: BLOOD  Result Value Ref Range Status   Specimen Description   Final    BLOOD LEFT ANTECUBITAL Performed at [place] [address], [place], [place] [address]    Special Requests   Final    BOTTLES DRAWN AEROBIC ONLY Blood Culture results may not be optimal due to an inadequate volume of blood received in culture bottles Performed at [place] [address] [place], [place] [address]    Culture   Final    NO GROWTH 5 DAYS Performed at [facility], [place] [address], [place], [place] [address]    Report Status 03/29/2021 FINAL  Final       Radiology Studies: CT HEAD WO CONTRAST  Result Date: 03/28/2021 CLINICAL DATA:  Seizure.  Status post fall. EXAM: CT HEAD WITHOUT CONTRAST TECHNIQUE: Contiguous axial images were obtained from the base of the skull through the vertex without intravenous contrast. COMPARISON:  03/27/2021 FINDINGS: Brain: No evidence of acute infarction, hemorrhage, hydrocephalus, extra-axial collection or mass lesion/mass effect. Vascular: No hyperdense vessel or unexpected calcification. Skull: Normal. Negative for fracture or focal lesion. Sinuses/Orbits: The paranasal sinuses and mastoid air cells are clear. Other: None IMPRESSION: No acute intracranial abnormalities. Normal brain. Electronically Signed   By: Kerby Moors M.D.   On: 03/28/2021 17:51   CT HEAD WO CONTRAST  Result Date: 03/27/2021 CLINICAL DATA:  Seizure EXAM: CT HEAD WITHOUT CONTRAST TECHNIQUE: Contiguous axial images were obtained from the base of the skull through the vertex without intravenous contrast. COMPARISON:  MRI 12/17/2020, CT brain 06/28/2018 FINDINGS: Brain:  No acute territorial infarction, hemorrhage, or intracranial mass. The ventricles are nonenlarged. Mild atrophy. Vascular: No hyperdense vessels.  No unexpected calcification Skull: Normal. Negative for fracture or focal lesion. Sinuses/Orbits: No acute finding. Other: None IMPRESSION: Negative non contrasted CT appearance of the brain Electronically Signed   By: Donavan Foil M.D.   On: 03/27/2021 18:42   EEG adult  Result Date: 03/29/2021 Lora Havens, MD     03/29/2021 10:14 AM Patient Name: [name] MRN: [id number] Epilepsy Attending: Lora Havens Referring Physician/Provider: Dr Donnetta Simpers Date: 03/29/2021 Duration: 22.31 mins Patient history: 60 y.o. male with PMH significant for seizures, HFrEF due to ischemic cardiomyopathy, non-small cell carcinoma of the right lung- stage III, hyperlipidemia, peripheral arterial disease s/p right BKA who was admitted to [facility] after cardiac arrest. EEG to evaluate for seizure Level of alertness: Awake AEDs during EEG study: LEV Technical aspects: This EEG study was done with scalp electrodes positioned according to the 10-20 International system of electrode placement. Electrical activity was acquired at a sampling rate of 500Hz  and reviewed with a high frequency filter of 70Hz  and a low  frequency filter of 1Hz . EEG data were recorded continuously and digitally stored. Description: The posterior dominant rhythm consists of 8-9 Hz activity of moderate voltage (25-35 uV) seen predominantly in posterior head regions, symmetric and reactive to eye opening and eye closing. Physiologic photic driving was not seen during photic stimulation.  Hyperventilation was not performed.   IMPRESSION: This study is within normal limits. No seizures or epileptiform discharges were seen throughout the recording. Priyanka Barbra Sarks      Scheduled Meds:  (feeding supplement) PROSource Plus  30 mL Oral BID BM   amitriptyline  100 mg Oral QHS   aspirin EC  81 mg Oral  Daily   atorvastatin  80 mg Oral Daily   azithromycin  500 mg Oral q1800   Chlorhexidine Gluconate Cloth  6 each Topical Daily   citalopram  40 mg Oral Daily   enoxaparin (LOVENOX) injection  40 mg Subcutaneous Q24H   feeding supplement  1 Container Oral BID BM   folic acid  1 mg Oral Daily   insulin aspart  0-15 Units Subcutaneous TID WC   levothyroxine  125 mcg Oral Q0600   mouth rinse  15 mL Mouth Rinse BID   midodrine  10 mg Oral TID WC   multivitamin with minerals  1 tablet Oral Daily   pantoprazole  40 mg Oral Q1200   predniSONE  30 mg Oral Q breakfast   Continuous Infusions:  levETIRAcetam 1,000 mg (03/28/21 2247)      LOS: 5 days      Time spent: 45 minutes   Grasiela Jonsson, DO [facility] 03/29/2021, 11:17 AM   Available via Epic secure chat 7am-7pm After these hours, please refer to coverage provider listed on [email or web address]

## 2021-03-29 NOTE — Procedures (Signed)
Patient Name: [name]  MRN: [id number]  Epilepsy Attending: Lora Havens  Referring Physician/Provider: Dr Donnetta Simpers Date: 03/29/2021 Duration: 22.31 mins  Patient history: 60 y.o. male with PMH significant for seizures, HFrEF due to ischemic cardiomyopathy, non-small cell carcinoma of the right lung- stage III, hyperlipidemia, peripheral arterial disease s/p right BKA who was admitted to [facility] after cardiac arrest. EEG to evaluate for seizure  Level of alertness: Awake  AEDs during EEG study: LEV  Technical aspects: This EEG study was done with scalp electrodes positioned according to the 10-20 International system of electrode placement. Electrical activity was acquired at a sampling rate of 500Hz  and reviewed with a high frequency filter of 70Hz  and a low frequency filter of 1Hz . EEG data were recorded continuously and digitally stored.   Description: The posterior dominant rhythm consists of 8-9 Hz activity of moderate voltage (25-35 uV) seen predominantly in posterior head regions, symmetric and reactive to eye opening and eye closing. Physiologic photic driving was not seen during photic stimulation.  Hyperventilation was not performed.     IMPRESSION: This study is within normal limits. No seizures or epileptiform discharges were seen throughout the recording.  Benjimin Hadden Barbra Sarks

## 2021-03-29 NOTE — TOC Transition Note (Signed)
Transition of Care [facility]) - CM/SW Discharge Note   Patient Details  Name: [name] MRN: [id number] Date of Birth: 1961-04-08  [facility] CM/SW Contact:  Carles Collet, RN Phone Number: 03/29/2021, 3:41 PM   Clinical Narrative:    [place] services arranged through [facility]    Final next level of care: [place] Barriers to Discharge: No Barriers Identified   Patient Goals and CMS Choice Patient states their goals for this hospitalization and ongoing recovery are:: to go home CMS Medicare.gov Compare Post Acute Care list provided to:: Patient Choice offered to / list presented to : Patient  Discharge Placement                       Discharge Plan and Services   Discharge Planning Services: CM Consult            DME Arranged: N/A         HH Arranged: PT, OT HH Agency: [facility] Date [place]: 03/29/21 Time [place]: 2694 Representative spoke with at [place]: [place] ([place]) Interventions     Readmission Risk Interventions No flowsheet data found.

## 2021-03-29 NOTE — Progress Notes (Signed)
Patient refused his weight to be taken this morning. Very agitated about it it. Will try again later.

## 2021-03-29 NOTE — Progress Notes (Addendum)
Notified by RN that after discharge orders were written-patient sustained another mechanical fall. I went by the patient's room-he was sitting on the commode-completely awake and alert.  He claims that he got entangled on the wires-and fell and landed on his sacrum area.  He denies ever hitting his head.  Neuro exam is benign.  He desperately wants to leave the hospital today.  Stable for discharge given benign exam. Son at bedside-acknowledges-father is clumsy-and reassures that family will be home 24/7

## 2021-03-29 NOTE — Progress Notes (Signed)
Neurology Progress Note  Brief HPI: [name] is a 60 y.o. male with PMH significant for seizures, HFrEF due to ischemic cardiomyopathy, non-small cell carcinoma of the right lung- stage III, HLD, PAD s/p right BKA who was admitted to [facility] after cardiac arrest. Following commands and was ready for discharge when he had a 30sec seizure followed by post ictal confusion. He is followed by Dr. Mickeal Skinner for seizure-like activity and was last seen in February 2021 when he decided he did not want to take AEDs. Initially, on hospitalization he refused treatment further diagnostic work up of seizures. He had another witnessed seizure 03/28/2021 while inpatient and subsequently decided to cooperate with further diagnostic work ups and treatments.   Subjective: No acute overnight events noted Routine EEG in progress during assessment  Exam: Vitals:   03/29/21 0349 03/29/21 1010  BP: 99/63 106/77  Pulse: 93 98  Resp: 19   Temp: 98.9 F (37.2 C) 98.9 F (37.2 C)  SpO2: 100% 97%   Gen: Laying comfortably in bed, awake, watching television while being connected to EEG wires, in no acute distress Resp: non-labored breathing, no respiratory distress Abd: soft, non-distended  Neuro: Mental Status: Awake, alert, and oriented to self and place. When asked his [age] he states "52-something, I don't know, since 50" and when asked the month he states "I do not know". He states that the year is "2000 something, I'm not sure".  He is unable to provide a clear and coherent history of present illness. He states that he walked out of the OSH and does not know how he ended up at [facility].  Speech is mildly dysarthric. No aphasia noted.  No neglect noted. Cranial Nerves: PERRL 3 mm / brisk, visual fields are full, EOMI, facial sensation is intact and symmetric, face is symmetric resting and smiling, hearing is intact to voice, palate elevates symmetrically, shoulder shrug is symmetric, tongue protrudes midline.   Motor: 5/5 strength in bilateral upper and lower extremities, right lower extremity with BKA.  No vertical drift throughout on assessment. Tone and bulk are normal.  Sensory: Sensation to light touch is intact and symmetric in upper and lower extremities (above the knee on right lower extremity due to BKA) DTR: 2+ and symmetric biceps and patellae  Gait: Deferred  Pertinent Labs: CBC    Component Value Date/Time   WBC 8.2 03/27/2021 0415   RBC 3.35 (L) 03/27/2021 0415   HGB 8.7 (L) 03/27/2021 0415   HGB 9.5 (L) 03/24/2021 0938   HGB 16.4 02/27/2018 1457   HCT 27.6 (L) 03/27/2021 0415   HCT 47.6 02/27/2018 1457   PLT 253 03/27/2021 0415   PLT 318 03/24/2021 0938   PLT 307 02/27/2018 1457   MCV 82.4 03/27/2021 0415   MCV 90 02/27/2018 1457   MCH 26.0 03/27/2021 0415   MCHC 31.5 03/27/2021 0415   RDW 17.1 (H) 03/27/2021 0415   RDW 14.5 02/27/2018 1457   LYMPHSABS 3.6 03/24/2021 1337   MONOABS 1.0 03/24/2021 1337   EOSABS 0.0 03/24/2021 1337   BASOSABS 0.0 03/24/2021 1337   CMP     Component Value Date/Time   NA 130 (L) 03/27/2021 0415   NA 139 02/27/2018 1457   K 4.1 03/27/2021 0415   CL 95 (L) 03/27/2021 0415   CO2 28 03/27/2021 0415   GLUCOSE 111 (H) 03/27/2021 0415   BUN 24 (H) 03/27/2021 0415   BUN 7 02/27/2018 1457   CREATININE 0.84 03/27/2021 0415  CREATININE 0.84 03/24/2021 0938   CREATININE 1.13 06/28/2016 1453   CALCIUM 8.5 (L) 03/27/2021 0415   PROT 6.2 (L) 03/24/2021 1337   PROT 6.5 01/31/2018 1124   ALBUMIN 2.8 (L) 03/24/2021 1337   ALBUMIN 4.4 01/31/2018 1124   AST 43 (H) 03/24/2021 1337   AST 41 03/24/2021 0938   ALT 42 03/24/2021 1337   ALT 42 03/24/2021 0938   ALKPHOS 121 03/24/2021 1337   BILITOT 0.7 03/24/2021 1337   BILITOT 0.3 03/24/2021 0938   GFRNONAA >60 03/27/2021 0415   GFRNONAA >60 03/24/2021 0938   GFRNONAA 74 06/28/2016 1453   GFRAA >60 06/18/2020 1106   GFRAA 85 06/28/2016 1453   Urinalysis    Component Value Date/Time    COLORURINE YELLOW 01/26/2016 1435   APPEARANCEUR CLEAR 01/26/2016 1435   LABSPEC 1.011 01/26/2016 1435   PHURINE 7.0 01/26/2016 1435   GLUCOSEU NEGATIVE 01/26/2016 1435   HGBUR NEGATIVE 01/26/2016 1435   BILIRUBINUR NEGATIVE 01/26/2016 1435   KETONESUR NEGATIVE 01/26/2016 1435   PROTEINUR NEGATIVE 01/26/2016 1435   UROBILINOGEN 0.2 11/20/2007 1253   NITRITE NEGATIVE 01/26/2016 1435   LEUKOCYTESUR NEGATIVE 01/26/2016 1435   Drugs of Abuse  No results found for: LABOPIA, COCAINSCRNUR, LABBENZ, AMPHETMU, THCU, LABBARB   Imaging Reviewed:  CT Head 03/28/2021: No acute intracranial abnormalities. Normal brain.  Routine EEG 03/29/2021: "IMPRESSION: This study is within normal limits. No seizures or epileptiform discharges were seen throughout the recording."  Assessment: [name] is a 60 y.o. 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 Following commands and was ready for discharge when he had a 30sec seizure followed by post ictal confusion with a second witnessed seizure 03/28/2021 while inpatient. Initially adamantly refused further diagnostic work up and treatment but is amenable to these following his second witnessed seizure. - Examination reveals patient with confusion to date and time but oriented to self and place. He does not have focal neurologic deficits noted.  - Normal CT head imaging obtained following seizures. MRI brain with and without contrast pending with delay due to holiday weekend and unavailability of [employer] rep/cardiology logistics.  - Routine EEG complete- findings within normal limits without seizures or epileptiform discharges.  - It is possible that patient had an unwitnessed seizure at the time of his cardiac arrest versus hypotension related PEA arrest.  - Discussed AED treatment with patient and son at  bedside. Per patient's son, patient stopped taking Keppra outpatient due to it making him feel "zoned out". Discussed Vimpat treatment with patient and son and they are agreeable to try Vimpat for AED. Decided against Depakote or Dilantin due to risk for drug-to0drug interactions with chemotherapy.    Impression:  Breakthrough Seizures Non compliance with AEDs.  Recommendations: - MRI brain with and without contrast pending when able to obtain, reasonable to obtain on an outpatient basis - Continue inpatient seizure precautions - Ativan 2 mg for any seizure lasting > 5 minutes and notify neurology - Vimpat 200 mg x 1 followed by 50 mg BID  - Follow with Dr. Mickeal Skinner in 4 weeks outpatient for further AED management  Anibal Henderson, AGACNP-BC [facility] [phone]   Attending Neurohospitalist Addendum Patient seen and examined with APP/Resident. Agree with the history and physical as documented above. Agree with the plan as documented, which I helped formulate. I have independently reviewed  the chart, obtained history, review of systems and examined the patient.I have personally reviewed pertinent head/neck/spine imaging (CT/MRI). Please feel free to call with any questions.  -- Amie Portland, MD Neurologist [facility] Pager: [phone]

## 2021-03-29 NOTE — Discharge Instructions (Signed)
  Seizure precautions: Per [facility] statutes, patients with seizures are not allowed to drive until they have been seizure-free for six months and cleared by a physician    Use caution when using heavy equipment or power tools. Avoid working on ladders or at heights. Take showers instead of baths. Ensure the water temperature is not too high on the home water heater. Do not go swimming alone. Do not lock yourself in a room alone (i.e. bathroom). When caring for infants or small children, sit down when holding, feeding, or changing them to minimize risk of injury to the child in the event you have a seizure. Maintain good sleep hygiene. Avoid alcohol.    If patient has another seizure, call 911 and bring them back to the ED if: A.  The seizure lasts longer than 5 minutes.      B.  The patient doesn't wake shortly after the seizure or has new problems such as difficulty seeing, speaking or moving following the seizure C.  The patient was injured during the seizure D.  The patient has a temperature over 102 F (39C) E.  The patient vomited during the seizure and now is having trouble breathing    During the Seizure   - First, ensure adequate ventilation and place patients on the floor on their left side  Loosen clothing around the neck and ensure the airway is patent. If the patient is clenching the teeth, do not force the mouth open with any object as this can cause severe damage - Remove all items from the surrounding that can be hazardous. The patient may be oblivious to what's happening and may not even know what he or she is doing. If the patient is confused and wandering, either gently guide him/her away and block access to outside areas - Reassure the individual and be comforting - Call 911. In most cases, the seizure ends before EMS arrives. However, there are cases when seizures may last over 3 to 5 minutes. Or the individual may have developed breathing difficulties or severe  injuries. If a pregnant patient or a person with diabetes develops a seizure, it is prudent to call an ambulance. - Finally, if the patient does not regain full consciousness, then call EMS. Most patients will remain confused for about 45 to 90 minutes after a seizure, so you must use judgment in calling for help. - Avoid restraints but make sure the patient is in a bed with padded side rails - Place the individual in a lateral position with the neck slightly flexed; this will help the saliva drain from the mouth and prevent the tongue from falling backward - Remove all nearby furniture and other hazards from the area - Provide verbal assurance as the individual is regaining consciousness - Provide the patient with privacy if possible - Call for help and start treatment as ordered by the caregiver    After the Seizure (Postictal Stage)   After a seizure, most patients experience confusion, fatigue, muscle pain and/or a headache. Thus, one should permit the individual to sleep. For the next few days, reassurance is essential. Being calm and helping reorient the person is also of importance.   Most seizures are painless and end spontaneously. Seizures are not harmful to others but can lead to complications such as stress on the lungs, brain and the heart. Individuals with prior lung problems may develop labored breathing and respiratory distress.

## 2021-03-29 NOTE — Discharge Summary (Signed)
PATIENT DETAILS Name: RIGDON MACOMBER Age: 60 y.o. Sex: male Date of Birth: 12-Sep-1961 MRN: [id number]. Admitting Physician: Julian Hy, DO QBH:ALPFX, Purcell Nails, MD  Admit Date: 03/24/2021 Discharge date: 03/29/2021  Recommendations for Outpatient Follow-up:  Follow up with PCP in 1-2 weeks Please obtain CMP/CBC in one week Please ensure follow-up with neuro oncology (Dr. Mickeal Skinner) and medical oncology. Please ensure follow-up with cardiology. All CHF medications on hold due to severe hypotension. Please repeat a TSH in the next 4-6 weeks  Admitted From:  Home  Disposition: Home with home health services   [place]: Yes  Equipment/Devices: None  Discharge Condition: Stable  CODE STATUS: FULL CODE  Diet recommendation:  Diet Order             Diet - low sodium heart healthy           Diet heart healthy/carb modified Room service appropriate? 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 When he was found, he was pulseless and CPR was started. ROSC within about 10 min. Intubated by anesthesia during the code.  In the ED-he was found to be hypotensive requiring norepinephrine.  Per prior notes-he has had hypotension at other appointments recently, and patient had not been eating well due to dysgeusa. Patient was admitted to the ICU on 6/28 after cardiac arrest, suspected PEA due to hypotension.  Once he was stabilized-extubated-he was then transferred to [facility] on 6/30.  Further hospital course complicated by an episode of seizure-requiring transfer to [facility] for neurology/seizure evaluation.   Significant events: 6/20>> PEA arrest-admit to ICU 6/30>> transfer to [facility] 7/1>> seizure episode-transfer to [facility] for neuro eval 7/2>> another episode of  seizure while at [facility].  Brief Hospital Course: PEA arrest: Felt to be due to hypotension associated with CHF medications and poor intake.  Briefly required pressors while in the ICU.  Remains on midodrine-all CHF medications currently on hold.  Please reevaluate in the outpatient setting.   Breakthrough seizures: Noncompliant with seizure medications in the past-initially refused to be placed on long-term antiepileptics-hence it was felt that further work-up would not be beneficial.  However patient had another episode of seizure on 7/2-subsequently after discussion with patient/family-he agreed to be placed on long-term antiepileptics.  EEG was negative for seizures.  Neurology followed on 7/3-and spoke to family member-apparently patient stopped taking Keppra in the past as it made him drowsy-neurology recommends stopping Keppra and starting patient on Vimpat.  Recommendations from neurology are to discharge home today on Vimpat-MRI brain can be done in the outpatient setting.  Patient aware of driving restrictions (see below) and the need to follow-up with his primary neuro oncologist Dr. Mickeal Skinner for further AED management.   COPD exacerbation: Improved-l lungs clear on exam-continue tapering prednisone-suspect does not require Zithromax on discharge.  Continue bronchodilators.   HFrEF (EF 25-30% by TTE on 6/28) s/p AICD in place: Volume status stable-no longer on GDMT due to hypotension and cardiac arrest.  BP stable on midodrine.   CAD s/p PCI 05/26/2016: No anginal symptoms-on aspirin/statin.   Hypothyroidism: Continue Synthroid-TSH remains mildly elevated-stable to continue Synthroid and follow-up with PCP for dose adjustment.  BPH: Continue Flomax   History of 3B non-small cell cancer of the lung: Resume outpatient follow-up with Dr. 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  Continue periodic monitoring.   Right BKA   Sepsis ruled out -Presented with WBC 29, HR 72, RR 17, temperature 97.8 -Procalcitonin remains negative, lactic acid 1.9 -Blood cultures negative to date -Respiratory culture with normal respiratory flora -No source of infection found  Procedures None  Discharge Diagnoses:  Principal Problem:   Cardiac arrest [facility]) Active Problems:   COPD with chronic bronchitis and emphysema (HCC)   Non-small cell carcinoma of right lung, stage 3 (HCC)   Seizures (HCC)   Hypothyroidism   Discharge Instructions:  Activity:  As tolerated with Full fall precautions use walker/cane & assistance as needed  Discharge Instructions     Call MD for:  difficulty breathing, headache or visual disturbances   Complete by: As directed    Call MD for:  extreme fatigue   Complete by: As directed    Diet - low sodium heart healthy   Complete by: As directed    Discharge instructions   Complete by: As directed    Follow with Primary MD  Redmond School, MD in 1-2 weeks  Please follow-up with Dr. Earlie Server (medical oncology) with Dr. Mickeal Skinner (neuro-oncology) and cardiology in the next 1-2 weeks.  All your blood pressure medications and heart failure medications have been stopped-as they can cause your blood pressure to be low.  Please get a complete blood count and chemistry panel checked by your Primary MD at your next visit, and again as instructed by your Primary MD.  Get Medicines reviewed and adjusted: Please take all your medications with you for your next visit with your Primary MD  Laboratory/radiological data: Please request your Primary MD to go over all hospital tests and procedure/radiological results at the follow up, please ask your Primary MD to get all Hospital records sent to his/her office.  In some cases, they will be blood work, cultures and biopsy results pending at the time of your discharge.  Please request that your primary care M.D. follows up on these results.  Also Note the following: If you experience worsening of your admission symptoms, develop shortness of breath, life threatening emergency, suicidal or homicidal thoughts you must seek medical attention immediately by calling 911 or calling your MD immediately  if symptoms less severe.  You must read complete instructions/literature along with all the possible adverse reactions/side effects for all the Medicines you take and that have been prescribed to you. Take any new Medicines after you have completely understood and accpet all the possible adverse reactions/side effects.   Do not drive when taking Pain medications or sleeping medications (Benzodaizepines)  Do not take more than prescribed Pain, Sleep and Anxiety Medications. It is not advisable to combine anxiety,sleep and pain medications without talking with your primary care practitioner  Special Instructions: If you have smoked or chewed Tobacco  in the last 2 yrs please stop smoking, stop any regular Alcohol  and or any Recreational drug use.  Wear Seat belts while driving.  Please note: You were cared for by a hospitalist during your hospital stay. Once you are discharged, your primary care physician will handle any further medical issues. Please note that NO REFILLS for any discharge medications will be authorized once you are discharged, as it  is imperative that you return to your primary care physician (or establish a relationship with a primary care physician if you do not have one) for your post hospital discharge needs so that they can reassess your need for medications and monitor your lab values.   Seizure precautions: Per [facility] statutes, patients with seizures are not allowed to drive until they have been seizure-free for six months and cleared by a physician    Use caution when using heavy equipment or power tools. Avoid working on ladders or  at heights. Take showers instead of baths. Ensure the water temperature is not too high on the home water heater. Do not go swimming alone. Do not lock yourself in a room alone (i.e. bathroom). When caring for infants or small children, sit down when holding, feeding, or changing them to minimize risk of injury to the child in the event you have a seizure. Maintain good sleep hygiene. Avoid alcohol.    If patient has another seizure, call 911 and bring them back to the ED if: A.  The seizure lasts longer than 5 minutes.      B.  The patient doesn't wake shortly after the seizure or has new problems such as difficulty seeing, speaking or moving following the seizure C.  The patient was injured during the seizure D.  The patient has a temperature over 102 F (39C) E.  The patient vomited during the seizure and now is having trouble breathing    During the Seizure   - First, ensure adequate ventilation and place patients on the floor on their left side  Loosen clothing around the neck and ensure the airway is patent. If the patient is clenching the teeth, do not force the mouth open with any object as this can cause severe damage - Remove all items from the surrounding that can be hazardous. The patient may be oblivious to what's happening and may not even know what he or she is doing. If the patient is confused and wandering, either gently guide him/her away and block access to outside areas - Reassure the individual and be comforting - Call 911. In most cases, the seizure ends before EMS arrives. However, there are cases when seizures may last over 3 to 5 minutes. Or the individual may have developed breathing difficulties or severe injuries. If a pregnant patient or a person with diabetes develops a seizure, it is prudent to call an ambulance. - Finally, if the patient does not regain full consciousness, then call EMS. Most patients will remain confused for about 45 to 90 minutes after a seizure, so  you must use judgment in calling for help. - Avoid restraints but make sure the patient is in a bed with padded side rails - Place the individual in a lateral position with the neck slightly flexed; this will help the saliva drain from the mouth and prevent the tongue from falling backward - Remove all nearby furniture and other hazards from the area - Provide verbal assurance as the individual is regaining consciousness - Provide the patient with privacy if possible - Call for help and start treatment as ordered by the caregiver    After the Seizure (Postictal Stage)   After a seizure, most patients experience confusion, fatigue, muscle pain and/or a headache. Thus, one should permit the individual to sleep. For the next few days, reassurance is essential. Being calm and helping reorient the person is also of importance.   Most seizures are painless  and end spontaneously. Seizures are not harmful to others but can lead to complications such as stress on the lungs, brain and the heart. Individuals with prior lung problems may develop labored breathing and respiratory distress.   Increase activity slowly   Complete by: As directed       Allergies as of 03/29/2021       Reactions   Lyrica [pregabalin] Palpitations   Chest pain and heart palps.   Neurontin [gabapentin] Palpitations   Chest pain and heart palp        Medication List     STOP taking these medications    carvedilol 6.25 MG tablet Commonly known as: COREG   furosemide 20 MG tablet Commonly known as: LASIX   lisinopril 2.5 MG tablet Commonly known as: ZESTRIL   spironolactone 25 MG tablet Commonly known as: ALDACTONE       TAKE these medications    (feeding supplement) PROSource Plus liquid Take 30 mLs by mouth 2 (two) times daily between meals.   albuterol 108 (90 Base) MCG/ACT inhaler Commonly known as: VENTOLIN HFA Inhale 1-2 puffs into the lungs every 4 (four) hours as needed for wheezing or  shortness of breath.   alprazolam 2 MG tablet Commonly known as: XANAX Take 2 mg by mouth 4 (four) times daily as needed for anxiety.   amitriptyline 100 MG tablet Commonly known as: ELAVIL Take 100 mg by mouth at bedtime.   aspirin 81 MG EC tablet Take 81 mg by mouth daily.   atorvastatin 80 MG tablet Commonly known as: LIPITOR Take 1 tablet (80 mg total) by mouth daily at 6 PM. What changed: when to take this   citalopram 40 MG tablet Commonly known as: CELEXA Take 40 mg by mouth daily.   dexamethasone 4 MG tablet Commonly known as: DECADRON Please take 1 tablet twice a day the day before, the day of, and the day after treatment What changed:  how much to take how to take this when to take this   folic acid 1 MG tablet Commonly known as: FOLVITE Take 1 tablet (1 mg total) by mouth daily.   HYDROcodone-acetaminophen 10-325 MG tablet Commonly known as: NORCO Take 1 tablet by mouth every 4 (four) hours as needed for moderate pain.   lacosamide 50 MG Tabs tablet Commonly known as: VIMPAT Take 1 tablet (50 mg total) by mouth 2 (two) times daily.   levothyroxine 125 MCG tablet Commonly known as: SYNTHROID Take 125 mcg by mouth daily.   lidocaine-prilocaine cream Commonly known as: EMLA Apply 1 application topically as needed. What changed:  when to take this reasons to take this   midodrine 10 MG tablet Commonly known as: PROAMATINE Take 1 tablet (10 mg total) by mouth 3 (three) times daily with meals.   nitroGLYCERIN 0.4 MG SL tablet Commonly known as: NITROSTAT Place 1 tablet (0.4 mg total) under the tongue every 5 (five) minutes x 3 doses as needed for chest pain.   predniSONE 10 MG tablet Commonly known as: DELTASONE Take  30 mg daily for 1 day, 20 mg daily for 1 days,10 mg daily for 1 day, then stop   prochlorperazine 10 MG tablet Commonly known as: COMPAZINE Take 1 tablet (10 mg total) by mouth every 6 (six) hours as needed for nausea or  vomiting.   sucralfate 1 g tablet Commonly known as: Carafate Take 1 tablet (1 g total) by mouth 4 (four) times daily -  with meals and at bedtime. 5  min before meals for radiation induced esophagitis What changed: when to take this   tamsulosin 0.4 MG Caps capsule Commonly known as: FLOMAX Take 0.4 mg by mouth daily.   Trelegy Ellipta 100-62.5-25 MCG/INH Aepb Generic drug: Fluticasone-Umeclidin-Vilant Inhale 1 puff into the lungs daily.        Follow-up Information     Redmond School, MD. Schedule an appointment as soon as possible for a visit in 1 week(s).   Specialty: Internal Medicine Contact information: [address] [place] [address] [phone]         [name], MD. Schedule an appointment as soon as possible for a visit in 2 week(s).   Specialty: Cardiology Contact information: [address] [place] [address] [phone]         Ventura Sellers, MD. Schedule an appointment as soon as possible for a visit in 1 week(s).   Specialties: Psychiatry, Neurology, Oncology Contact information: [place] [address] [phone]                Allergies  Allergen Reactions   Lyrica [Pregabalin] Palpitations    Chest pain and heart palps.   Neurontin [Gabapentin] Palpitations    Chest pain and heart palp    Consultations:  neurology    Other Procedures/Studies: CT HEAD WO CONTRAST  Result Date: 03/28/2021 CLINICAL DATA:  Seizure.  Status post fall. EXAM: CT HEAD WITHOUT CONTRAST TECHNIQUE: Contiguous axial images were obtained from the base of the skull through the vertex without intravenous contrast. COMPARISON:  03/27/2021 FINDINGS: Brain: No evidence of acute infarction, hemorrhage, hydrocephalus, extra-axial collection or mass lesion/mass effect. Vascular: No hyperdense vessel or unexpected calcification. Skull: Normal. Negative for fracture or focal lesion. Sinuses/Orbits: The  paranasal sinuses and mastoid air cells are clear. Other: None IMPRESSION: No acute intracranial abnormalities. Normal brain. Electronically Signed   By: Kerby Moors M.D.   On: 03/28/2021 17:51   CT HEAD WO CONTRAST  Result Date: 03/27/2021 CLINICAL DATA:  Seizure EXAM: CT HEAD WITHOUT CONTRAST TECHNIQUE: Contiguous axial images were obtained from the base of the skull through the vertex without intravenous contrast. COMPARISON:  MRI 12/17/2020, CT brain 06/28/2018 FINDINGS: Brain: No acute territorial infarction, hemorrhage, or intracranial mass. The ventricles are nonenlarged. Mild atrophy. Vascular: No hyperdense vessels.  No unexpected calcification Skull: Normal. Negative for fracture or focal lesion. Sinuses/Orbits: No acute finding. Other: None IMPRESSION: Negative non contrasted CT appearance of the brain Electronically Signed   By: Donavan Foil M.D.   On: 03/27/2021 18:42   DG Chest Portable 1 View  Result Date: 03/24/2021 CLINICAL DATA:  post intubation EXAM: PORTABLE CHEST 1 VIEW COMPARISON:  Chest CT 02/20/2021, radiograph 03/21/2018 FINDINGS: Endotracheal tube tip overlies the mid trachea. There is a chest port catheter with tip overlying the superior cavoatrial junction. Unchanged single lead AICD. Defibrillator pads overlie the right and left chest. There is a nasogastric tube with side port and tip overlying the stomach. Unchanged cardiomediastinal silhouette. Right apical consolidation with cavitary change consistent with findings on recent chest CT. There are no other areas of visible airspace consolidation. There is no large pleural effusion or visible pneumothorax. IMPRESSION: Endotracheal tube tip overlies the mid trachea. Right apical consolidation with cavitary change consistent with findings on recent chest CT in May 2022. No other new focal airspace abnormalities. Electronically Signed   By: Maurine Simmering   On: 03/24/2021 14:55   EEG adult  Result Date: 03/29/2021 Zeb Comfort  Jenetta Downer, MD     03/29/2021 10:14 AM Patient Name: DERYCK HIPPLER MRN: [id number] Epilepsy Attending: Lora Havens Referring Physician/Provider: Dr Donnetta Simpers Date: 03/29/2021 Duration: 22.31 mins Patient history: 60 y.o. male with PMH significant for seizures, HFrEF due to ischemic cardiomyopathy, non-small cell carcinoma of the right lung- stage III, hyperlipidemia, peripheral arterial disease s/p right BKA who was admitted to [facility] after cardiac arrest. EEG to evaluate for seizure Level of alertness: Awake AEDs during EEG study: LEV Technical aspects: This EEG study was done with scalp electrodes positioned according to the 10-20 International system of electrode placement. Electrical activity was acquired at a sampling rate of 500Hz  and reviewed with a high frequency filter of 70Hz  and a low frequency filter of 1Hz . EEG data were recorded continuously and digitally stored. Description: The posterior dominant rhythm consists of 8-9 Hz activity of moderate voltage (25-35 uV) seen predominantly in posterior head regions, symmetric and reactive to eye opening and eye closing. Physiologic photic driving was not seen during photic stimulation.  Hyperventilation was not performed.   IMPRESSION: This study is within normal limits. No seizures or epileptiform discharges were seen throughout the recording. Lora Havens   ECHOCARDIOGRAM COMPLETE  Result Date: 03/24/2021    ECHOCARDIOGRAM REPORT   Patient Name:   LEIGH KAEDING Date of Exam: 03/24/2021 Medical Rec #:  [id number]   Height:       72.0 in Accession #:    [id number]  Weight:       207.2 lb Date of Birth:  02/17/1961   BSA:          2.163 m Patient Age:    48 years    BP:           89/70 mmHg Patient Gender: M           HR:           79 bpm. Exam Location:  Inpatient Procedure: 2D Echo, Cardiac Doppler, Color Doppler and Intracardiac            Opacification Agent Indications:    [facility]) [[id number]]  History:        Patient has prior history of  Echocardiogram examinations, most                 recent 02/07/2018. CHF, CAD and Previous Myocardial Infarction,                 Defibrillator, PAD and COPD; Risk Factors:Current Smoker.                 Cardiac arrest in cancer center. Cancer.  Sonographer:    Darlina Sicilian RDCS Referring Phys: [id number] [facility]  Sonographer Comments: Technically challenging study due to limited acoustic windows, suboptimal apical window, suboptimal parasternal window and suboptimal subcostal window. IMPRESSIONS  1. Left ventricular ejection fraction, by estimation, is 25-30%. The left ventricle has severely decreased function. Left ventricular endocardial border not optimally defined to evaluate regional wall motion (even with Definity contrast), but the apical  segments are severely hypokinetic/akinetic. Left ventricular diastolic parameters are consistent with Grade II diastolic dysfunction (pseudonormalization).  2. Right ventricular systolic function is normal. The right ventricular size is normal. Tricuspid regurgitation signal is inadequate for assessing PA pressure.  3. A small pericardial effusion is present. The pericardial effusion is anterior to the right ventricle. There is no evidence of cardiac tamponade.  4. The mitral valve is grossly normal. No evidence of mitral valve  regurgitation.  5. The aortic valve is normal in structure. Aortic valve regurgitation is not visualized. Comparison(s): No significant change from prior study. Prior images reviewed side by side. Today's study is of poor technical quality FINDINGS  Left Ventricle: Left ventricular ejection fraction, by estimation, is 25 to 30%. The left ventricle has severely decreased function. Left ventricular endocardial border not optimally defined to evaluate regional wall motion. Definity contrast agent was given IV to delineate the left ventricular endocardial borders. The left ventricular internal cavity size was normal in size. There is no left  ventricular hypertrophy. Left ventricular diastolic parameters are consistent with Grade II diastolic dysfunction (pseudonormalization). Elevated left atrial pressure. Right Ventricle: The right ventricular size is normal. Right vetricular wall thickness was not well visualized. Right ventricular systolic function is normal. Tricuspid regurgitation signal is inadequate for assessing PA pressure. Left Atrium: Left atrial size was not assessed. Right Atrium: Right atrial size was not assessed. Pericardium: A small pericardial effusion is present. The pericardial effusion is anterior to the right ventricle. There is no evidence of cardiac tamponade. Mitral Valve: The mitral valve is grossly normal. No evidence of mitral valve regurgitation. Tricuspid Valve: The tricuspid valve is not well visualized. Tricuspid valve regurgitation is not demonstrated. Aortic Valve: The aortic valve is normal in structure. Aortic valve regurgitation is not visualized. Pulmonic Valve: The pulmonic valve was not well visualized. Pulmonic valve regurgitation is not visualized. Aorta: The aortic root was not well visualized. IAS/Shunts: The interatrial septum was not assessed. Additional Comments: A device lead is visualized in the right ventricle.  LEFT VENTRICLE PLAX 2D LVOT diam:     2.10 cm      Diastology LVOT Area:     3.46 cm     LV e' medial:    5.65 cm/s                             LV E/e' medial:  8.6                             LV e' lateral:   5.34 cm/s LV Volumes (MOD)            LV E/e' lateral: 9.1 LV vol d, MOD A2C: 113.0 ml LV vol d, MOD A4C: 108.0 ml LV vol s, MOD A2C: 59.7 ml LV vol s, MOD A4C: 56.8 ml LV SV MOD A2C:     53.3 ml LV SV MOD A4C:     108.0 ml LV SV MOD BP:      52.6 ml  AORTA Ao Root diam: 3.40 cm MITRAL VALVE MV Area (PHT): 2.75 cm    SHUNTS MV Decel Time: 276 msec    Systemic Diam: 2.10 cm MV E velocity: 48.50 cm/s MV A velocity: 50.90 cm/s MV E/A ratio:  0.95 Mihai Croitoru MD Electronically signed by  Sanda Klein MD Signature Date/Time: 03/24/2021/5:22:24 PM    Final    CUP PACEART REMOTE DEVICE CHECK  Result Date: 03/04/2021 Scheduled remote reviewed. Normal device function.  Thoracic impedance trending down Next remote 91 days. HB    TODAY-DAY OF DISCHARGE:  Subjective:   Teddrick Zarazua today has no headache,no chest abdominal pain,no new weakness tingling or numbness, feels much better wants to go home today.   Objective:   Blood pressure 106/77, pulse 98, temperature 98.9 F (37.2 C), temperature source Oral, resp. rate 19, height 6' (  1.829 m), weight 91.1 kg, SpO2 97 %.  Intake/Output Summary (Last 24 hours) at 03/29/2021 1430 Last data filed at 03/29/2021 0100 Gross per 24 hour  Intake 200 ml  Output 300 ml  Net -100 ml   Filed Weights   03/24/21 1421 03/27/21 0443  Weight: 94 kg 91.1 kg    Exam: Awake Alert, Oriented *3, No new F.N deficits, Normal affect [place].AT,PERRAL Supple Neck,No JVD, No cervical lymphadenopathy appriciated.  Symmetrical Chest wall movement, Good air movement bilaterally, CTAB RRR,No Gallops,Rubs or new Murmurs, No Parasternal Heave +ve B.Sounds, Abd Soft, Non tender, No organomegaly appriciated, No rebound -guarding or rigidity. No Cyanosis, Clubbing or edema, No new Rash or bruise   PERTINENT RADIOLOGIC STUDIES: CT HEAD WO CONTRAST  Result Date: 03/28/2021 CLINICAL DATA:  Seizure.  Status post fall. EXAM: CT HEAD WITHOUT CONTRAST TECHNIQUE: Contiguous axial images were obtained from the base of the skull through the vertex without intravenous contrast. COMPARISON:  03/27/2021 FINDINGS: Brain: No evidence of acute infarction, hemorrhage, hydrocephalus, extra-axial collection or mass lesion/mass effect. Vascular: No hyperdense vessel or unexpected calcification. Skull: Normal. Negative for fracture or focal lesion. Sinuses/Orbits: The paranasal sinuses and mastoid air cells are clear. Other: None IMPRESSION: No acute intracranial abnormalities. Normal  brain. Electronically Signed   By: Kerby Moors M.D.   On: 03/28/2021 17:51   CT HEAD WO CONTRAST  Result Date: 03/27/2021 CLINICAL DATA:  Seizure EXAM: CT HEAD WITHOUT CONTRAST TECHNIQUE: Contiguous axial images were obtained from the base of the skull through the vertex without intravenous contrast. COMPARISON:  MRI 12/17/2020, CT brain 06/28/2018 FINDINGS: Brain: No acute territorial infarction, hemorrhage, or intracranial mass. The ventricles are nonenlarged. Mild atrophy. Vascular: No hyperdense vessels.  No unexpected calcification Skull: Normal. Negative for fracture or focal lesion. Sinuses/Orbits: No acute finding. Other: None IMPRESSION: Negative non contrasted CT appearance of the brain Electronically Signed   By: Donavan Foil M.D.   On: 03/27/2021 18:42   EEG adult  Result Date: 03/29/2021 Lora Havens, MD     03/29/2021 10:14 AM Patient Name: NICHOLAOS SCHIPPERS MRN: [id number] Epilepsy Attending: Lora Havens Referring Physician/Provider: Dr Donnetta Simpers Date: 03/29/2021 Duration: 22.31 mins Patient history: 60 y.o. male with PMH significant for seizures, HFrEF due to ischemic cardiomyopathy, non-small cell carcinoma of the right lung- stage III, hyperlipidemia, peripheral arterial disease s/p right BKA who was admitted to [facility] after cardiac arrest. EEG to evaluate for seizure Level of alertness: Awake AEDs during EEG study: LEV Technical aspects: This EEG study was done with scalp electrodes positioned according to the 10-20 International system of electrode placement. Electrical activity was acquired at a sampling rate of 500Hz  and reviewed with a high frequency filter of 70Hz  and a low frequency filter of 1Hz . EEG data were recorded continuously and digitally stored. Description: The posterior dominant rhythm consists of 8-9 Hz activity of moderate voltage (25-35 uV) seen predominantly in posterior head regions, symmetric and reactive to eye opening and eye closing. Physiologic  photic driving was not seen during photic stimulation.  Hyperventilation was not performed.   IMPRESSION: This study is within normal limits. No seizures or epileptiform discharges were seen throughout the recording. Priyanka O Yadav     PERTINENT LAB RESULTS: CBC: Recent Labs    03/27/21 0415  WBC 8.2  HGB 8.7*  HCT 27.6*  PLT 253   CMET CMP     Component Value Date/Time   NA 130 (L) 03/27/2021 0415   NA 139  02/27/2018 1457   K 4.1 03/27/2021 0415   CL 95 (L) 03/27/2021 0415   CO2 28 03/27/2021 0415   GLUCOSE 111 (H) 03/27/2021 0415   BUN 24 (H) 03/27/2021 0415   BUN 7 02/27/2018 1457   CREATININE 0.84 03/27/2021 0415   CREATININE 0.84 03/24/2021 0938   CREATININE 1.13 06/28/2016 1453   CALCIUM 8.5 (L) 03/27/2021 0415   PROT 6.2 (L) 03/24/2021 1337   PROT 6.5 01/31/2018 1124   ALBUMIN 2.8 (L) 03/24/2021 1337   ALBUMIN 4.4 01/31/2018 1124   AST 43 (H) 03/24/2021 1337   AST 41 03/24/2021 0938   ALT 42 03/24/2021 1337   ALT 42 03/24/2021 0938   ALKPHOS 121 03/24/2021 1337   BILITOT 0.7 03/24/2021 1337   BILITOT 0.3 03/24/2021 0938   GFRNONAA >60 03/27/2021 0415   GFRNONAA >60 03/24/2021 0938   GFRNONAA 74 06/28/2016 1453   GFRAA >60 06/18/2020 1106   GFRAA 85 06/28/2016 1453    GFR Estimated Creatinine Clearance: 105.2 mL/min (by C-G formula based on SCr of 0.84 mg/dL). No results for input(s): LIPASE, AMYLASE in the last 72 hours. No results for input(s): CKTOTAL, CKMB, CKMBINDEX, TROPONINI in the last 72 hours. Invalid input(s): POCBNP No results for input(s): DDIMER in the last 72 hours. No results for input(s): HGBA1C in the last 72 hours. No results for input(s): CHOL, HDL, LDLCALC, TRIG, CHOLHDL, LDLDIRECT in the last 72 hours. No results for input(s): TSH, T4TOTAL, T3FREE, THYROIDAB in the last 72 hours.  Invalid input(s): FREET3 No results for input(s): VITAMINB12, FOLATE, FERRITIN, TIBC, IRON, RETICCTPCT in the last 72 hours. Coags: No results for  input(s): INR in the last 72 hours.  Invalid input(s): PT Microbiology: Recent Results (from the past 240 hour(s))  Resp Panel by RT-PCR (Flu A&B, Covid) Nasopharyngeal Swab     Status: None   Collection Time: 03/24/21  2:17 PM   Specimen: Nasopharyngeal Swab; Nasopharyngeal(NP) swabs in vial transport medium  Result Value Ref Range Status   SARS Coronavirus 2 by RT PCR NEGATIVE NEGATIVE Final    Comment: (NOTE) SARS-CoV-2 target nucleic acids are NOT DETECTED.  The SARS-CoV-2 RNA is generally detectable in upper respiratory specimens during the acute phase of infection. The lowest concentration of SARS-CoV-2 viral copies this assay can detect is 138 copies/mL. A negative result does not preclude SARS-Cov-2 infection and should not be used as the sole basis for treatment or other patient management decisions. A negative result may occur with  improper specimen collection/handling, submission of specimen other than nasopharyngeal swab, presence of viral mutation(s) within the areas targeted by this assay, and inadequate number of viral copies(<138 copies/mL). A negative result must be combined with clinical observations, patient history, and epidemiological information. The expected result is Negative.  Fact Sheet for Patients:  EntrepreneurPulse.com.au  Fact Sheet for Healthcare Providers:  IncredibleEmployment.be  This test is no t yet approved or cleared by the [place] FDA and  has been authorized for detection and/or diagnosis of SARS-CoV-2 by FDA under an Emergency Use Authorization (EUA). This EUA will remain  in effect (meaning this test can be used) for the duration of the COVID-19 declaration under Section 564(b)(1) of the Act, 21 U.S.C.section 360bbb-3(b)(1), unless the authorization is terminated  or revoked sooner.       Influenza A by PCR NEGATIVE NEGATIVE Final   Influenza B by PCR NEGATIVE NEGATIVE Final    Comment:  (NOTE) The Xpert Xpress SARS-CoV-2/FLU/RSV plus assay is intended as an aid  in the diagnosis of influenza from Nasopharyngeal swab specimens and should not be used as a sole basis for treatment. Nasal washings and aspirates are unacceptable for Xpert Xpress SARS-CoV-2/FLU/RSV testing.  Fact Sheet for Patients: EntrepreneurPulse.com.au  Fact Sheet for Healthcare Providers: IncredibleEmployment.be  This test is not yet approved or cleared by the [place] FDA and has been authorized for detection and/or diagnosis of SARS-CoV-2 by FDA under an Emergency Use Authorization (EUA). This EUA will remain in effect (meaning this test can be used) for the duration of the COVID-19 declaration under Section 564(b)(1) of the Act, 21 U.S.C. section 360bbb-3(b)(1), unless the authorization is terminated or revoked.  Performed at [facility], [place] [address], [place], [place] [address]   MRSA PCR Screening     Status: None   Collection Time: 03/24/21  3:18 PM  Result Value Ref Range Status   MRSA by PCR NEGATIVE NEGATIVE Final    Comment:        The GeneXpert MRSA Assay (FDA approved for NASAL specimens only), is one component of a comprehensive MRSA colonization surveillance program. It is not intended to diagnose MRSA infection nor to guide or monitor treatment for MRSA infections. Performed at [facility], [place] [address], [place], [place] [address]   Culture, Respiratory w Gram Stain     Status: None   Collection Time: 03/24/21  3:55 PM   Specimen: Tracheal Aspirate; Respiratory  Result Value Ref Range Status   Specimen Description   Final    TRACHEAL ASPIRATE Performed at [facility] [address], [place], [place] [address]    Special Requests   Final    NONE Performed at [facility], [place] [address], [place], [place] [address]    Gram Stain   Final     MODERATE WBC PRESENT,BOTH PMN AND MONONUCLEAR FEW GRAM POSITIVE COCCI IN CHAINS FEW GRAM POSITIVE RODS    Culture   Final    Normal respiratory flora-no Staph aureus or Pseudomonas seen Performed at [facility], [address], [place], [place] [address]    Report Status 03/27/2021 FINAL  Final  Culture, blood (routine x 2)     Status: None   Collection Time: 03/24/21  4:09 PM   Specimen: BLOOD  Result Value Ref Range Status   Specimen Description   Final    BLOOD RIGHT ANTECUBITAL Performed at [facility] [address] [place], [place] [address]    Special Requests   Final    BOTTLES DRAWN AEROBIC ONLY Blood Culture adequate volume Performed at [facility] [address] [place], [place] [address]    Culture   Final    NO GROWTH 5 DAYS Performed at [facility], [place] [address] [place], [place] [address]    Report Status 03/29/2021 FINAL  Final  Culture, blood (routine x 2)     Status: None   Collection Time: 03/24/21  4:10 PM   Specimen: BLOOD  Result Value Ref Range Status   Specimen Description   Final    BLOOD LEFT ANTECUBITAL Performed at [facility] [address], [place], [place] [address]    Special Requests   Final    BOTTLES DRAWN AEROBIC ONLY Blood Culture results may not be optimal due to an inadequate volume of blood received in culture bottles Performed at [facility] [address] [place], [place] [address]  Culture   Final    NO GROWTH 5 DAYS Performed at [facility], [place] [address], [place], [place] [address]    Report Status 03/29/2021 FINAL  Final    FURTHER DISCHARGE INSTRUCTIONS:  Get Medicines reviewed and adjusted: Please take all your medications with you for your next visit with your Primary MD  Laboratory/radiological data: Please request your Primary MD to go over all hospital tests and procedure/radiological results at the  follow up, please ask your Primary MD to get all Hospital records sent to his/her office.  In some cases, they will be blood work, cultures and biopsy results pending at the time of your discharge. Please request that your primary care M.D. goes through all the records of your hospital data and follows up on these results.  Also Note the following: If you experience worsening of your admission symptoms, develop shortness of breath, life threatening emergency, suicidal or homicidal thoughts you must seek medical attention immediately by calling 911 or calling your MD immediately  if symptoms less severe.  You must read complete instructions/literature along with all the possible adverse reactions/side effects for all the Medicines you take and that have been prescribed to you. Take any new Medicines after you have completely understood and accpet all the possible adverse reactions/side effects.   Do not drive when taking Pain medications or sleeping medications (Benzodaizepines)  Do not take more than prescribed Pain, Sleep and Anxiety Medications. It is not advisable to combine anxiety,sleep and pain medications without talking with your primary care practitioner  Special Instructions: If you have smoked or chewed Tobacco  in the last 2 yrs please stop smoking, stop any regular Alcohol  and or any Recreational drug use.  Wear Seat belts while driving.  Please note: You were cared for by a hospitalist during your hospital stay. Once you are discharged, your primary care physician will handle any further medical issues. Please note that NO REFILLS for any discharge medications will be authorized once you are discharged, as it is imperative that you return to your primary care physician (or establish a relationship with a primary care physician if you do not have one) for your post hospital discharge needs so that they can reassess your need for medications and monitor your lab values.  Total Time  spent coordinating discharge including counseling, education and face to face time equals 35 minutes.  Signed: Kameran Lallier 03/29/2021 2:30 PM

## 2021-03-29 NOTE — Care Management (Signed)
[facility]oke [facility], generic Vimpat is $16. They do not have it in stock until Tuesday. [facility] [address] [facility] will [facility] there.

## 2021-03-29 NOTE — Progress Notes (Signed)
EEG Completed; Results Pending  

## 2021-03-31 ENCOUNTER — Telehealth: Payer: Self-pay | Admitting: *Deleted

## 2021-03-31 ENCOUNTER — Inpatient Hospital Stay: Payer: Medicare Other

## 2021-03-31 ENCOUNTER — Telehealth: Payer: Self-pay | Admitting: Physician Assistant

## 2021-03-31 NOTE — Telephone Encounter (Signed)
Received message from After hours messaging service stating wife had called to cancel Monday's lab appt. Message to scheduler to reschedule for this Thursday as Dr Julien Nordmann really wants to have labs done this week. Scheduler called patient and wife. They refused to come in this week for labs. MD aware.

## 2021-03-31 NOTE — Telephone Encounter (Signed)
I called pt to r/s appt per 7/5 sch msg. Spoke to pt's wife who said that pt is refusing to r/s appt at this time. I sent a msg to Cassie and RN Tammi to let them know of pt's decision.

## 2021-04-07 ENCOUNTER — Inpatient Hospital Stay: Payer: Medicare Other | Attending: Internal Medicine

## 2021-04-07 DIAGNOSIS — Z79899 Other long term (current) drug therapy: Secondary | ICD-10-CM | POA: Insufficient documentation

## 2021-04-07 DIAGNOSIS — I252 Old myocardial infarction: Secondary | ICD-10-CM | POA: Insufficient documentation

## 2021-04-07 DIAGNOSIS — F32A Depression, unspecified: Secondary | ICD-10-CM | POA: Insufficient documentation

## 2021-04-07 DIAGNOSIS — Z7952 Long term (current) use of systemic steroids: Secondary | ICD-10-CM | POA: Insufficient documentation

## 2021-04-07 DIAGNOSIS — J449 Chronic obstructive pulmonary disease, unspecified: Secondary | ICD-10-CM | POA: Insufficient documentation

## 2021-04-07 DIAGNOSIS — F419 Anxiety disorder, unspecified: Secondary | ICD-10-CM | POA: Insufficient documentation

## 2021-04-07 DIAGNOSIS — C3411 Malignant neoplasm of upper lobe, right bronchus or lung: Secondary | ICD-10-CM | POA: Insufficient documentation

## 2021-04-07 DIAGNOSIS — Z95810 Presence of automatic (implantable) cardiac defibrillator: Secondary | ICD-10-CM | POA: Insufficient documentation

## 2021-04-07 DIAGNOSIS — Z7982 Long term (current) use of aspirin: Secondary | ICD-10-CM | POA: Insufficient documentation

## 2021-04-07 DIAGNOSIS — Z5111 Encounter for antineoplastic chemotherapy: Secondary | ICD-10-CM | POA: Insufficient documentation

## 2021-04-09 DIAGNOSIS — E063 Autoimmune thyroiditis: Secondary | ICD-10-CM | POA: Diagnosis not present

## 2021-04-09 DIAGNOSIS — G894 Chronic pain syndrome: Secondary | ICD-10-CM | POA: Diagnosis not present

## 2021-04-09 DIAGNOSIS — G546 Phantom limb syndrome with pain: Secondary | ICD-10-CM | POA: Diagnosis not present

## 2021-04-09 DIAGNOSIS — Z89511 Acquired absence of right leg below knee: Secondary | ICD-10-CM | POA: Diagnosis not present

## 2021-04-09 DIAGNOSIS — Z6824 Body mass index (BMI) 24.0-24.9, adult: Secondary | ICD-10-CM | POA: Diagnosis not present

## 2021-04-09 DIAGNOSIS — C3490 Malignant neoplasm of unspecified part of unspecified bronchus or lung: Secondary | ICD-10-CM | POA: Diagnosis not present

## 2021-04-09 DIAGNOSIS — I469 Cardiac arrest, cause unspecified: Secondary | ICD-10-CM | POA: Diagnosis not present

## 2021-04-13 ENCOUNTER — Inpatient Hospital Stay (HOSPITAL_BASED_OUTPATIENT_CLINIC_OR_DEPARTMENT_OTHER): Payer: Medicare Other | Admitting: Internal Medicine

## 2021-04-13 ENCOUNTER — Inpatient Hospital Stay: Payer: Medicare Other

## 2021-04-13 ENCOUNTER — Encounter: Payer: Self-pay | Admitting: Internal Medicine

## 2021-04-13 ENCOUNTER — Other Ambulatory Visit: Payer: Self-pay

## 2021-04-13 VITALS — HR 98

## 2021-04-13 VITALS — BP 93/65 | HR 110 | Temp 98.2°F | Resp 18

## 2021-04-13 DIAGNOSIS — J449 Chronic obstructive pulmonary disease, unspecified: Secondary | ICD-10-CM | POA: Diagnosis not present

## 2021-04-13 DIAGNOSIS — F419 Anxiety disorder, unspecified: Secondary | ICD-10-CM | POA: Diagnosis not present

## 2021-04-13 DIAGNOSIS — C3490 Malignant neoplasm of unspecified part of unspecified bronchus or lung: Secondary | ICD-10-CM

## 2021-04-13 DIAGNOSIS — Z79899 Other long term (current) drug therapy: Secondary | ICD-10-CM | POA: Diagnosis not present

## 2021-04-13 DIAGNOSIS — Z95828 Presence of other vascular implants and grafts: Secondary | ICD-10-CM

## 2021-04-13 DIAGNOSIS — Z5111 Encounter for antineoplastic chemotherapy: Secondary | ICD-10-CM | POA: Diagnosis not present

## 2021-04-13 DIAGNOSIS — Z7952 Long term (current) use of systemic steroids: Secondary | ICD-10-CM | POA: Diagnosis not present

## 2021-04-13 DIAGNOSIS — C3491 Malignant neoplasm of unspecified part of right bronchus or lung: Secondary | ICD-10-CM

## 2021-04-13 DIAGNOSIS — Z95810 Presence of automatic (implantable) cardiac defibrillator: Secondary | ICD-10-CM | POA: Diagnosis not present

## 2021-04-13 DIAGNOSIS — C3411 Malignant neoplasm of upper lobe, right bronchus or lung: Secondary | ICD-10-CM | POA: Diagnosis not present

## 2021-04-13 DIAGNOSIS — I252 Old myocardial infarction: Secondary | ICD-10-CM | POA: Diagnosis not present

## 2021-04-13 DIAGNOSIS — Z7982 Long term (current) use of aspirin: Secondary | ICD-10-CM | POA: Diagnosis not present

## 2021-04-13 DIAGNOSIS — F32A Depression, unspecified: Secondary | ICD-10-CM | POA: Diagnosis not present

## 2021-04-13 LAB — CBC WITH DIFFERENTIAL (CANCER CENTER ONLY)
Abs Immature Granulocytes: 0.93 10*3/uL — ABNORMAL HIGH (ref 0.00–0.07)
Basophils Absolute: 0 10*3/uL (ref 0.0–0.1)
Basophils Relative: 0 %
Eosinophils Absolute: 0 10*3/uL (ref 0.0–0.5)
Eosinophils Relative: 0 %
HCT: 27 % — ABNORMAL LOW (ref 39.0–52.0)
Hemoglobin: 8.7 g/dL — ABNORMAL LOW (ref 13.0–17.0)
Immature Granulocytes: 10 %
Lymphocytes Relative: 6 %
Lymphs Abs: 0.5 10*3/uL — ABNORMAL LOW (ref 0.7–4.0)
MCH: 26.5 pg (ref 26.0–34.0)
MCHC: 32.2 g/dL (ref 30.0–36.0)
MCV: 82.3 fL (ref 80.0–100.0)
Monocytes Absolute: 0.8 10*3/uL (ref 0.1–1.0)
Monocytes Relative: 9 %
Neutro Abs: 7.2 10*3/uL (ref 1.7–7.7)
Neutrophils Relative %: 75 %
Platelet Count: 428 10*3/uL — ABNORMAL HIGH (ref 150–400)
RBC: 3.28 MIL/uL — ABNORMAL LOW (ref 4.22–5.81)
RDW: 20.7 % — ABNORMAL HIGH (ref 11.5–15.5)
WBC Count: 9.5 10*3/uL (ref 4.0–10.5)
nRBC: 0 % (ref 0.0–0.2)

## 2021-04-13 LAB — CMP (CANCER CENTER ONLY)
ALT: 50 U/L — ABNORMAL HIGH (ref 0–44)
AST: 43 U/L — ABNORMAL HIGH (ref 15–41)
Albumin: 3.1 g/dL — ABNORMAL LOW (ref 3.5–5.0)
Alkaline Phosphatase: 107 U/L (ref 38–126)
Anion gap: 12 (ref 5–15)
BUN: 9 mg/dL (ref 6–20)
CO2: 28 mmol/L (ref 22–32)
Calcium: 8.9 mg/dL (ref 8.9–10.3)
Chloride: 94 mmol/L — ABNORMAL LOW (ref 98–111)
Creatinine: 0.77 mg/dL (ref 0.61–1.24)
GFR, Estimated: >60 mL/min (ref >60)
Glucose, Bld: 133 mg/dL — ABNORMAL HIGH (ref 70–99)
Potassium: 3.3 mmol/L — ABNORMAL LOW (ref 3.5–5.1)
Sodium: 134 mmol/L — ABNORMAL LOW (ref 135–145)
Total Bilirubin: 0.4 mg/dL (ref 0.3–1.2)
Total Protein: 6.4 g/dL — ABNORMAL LOW (ref 6.5–8.1)

## 2021-04-13 LAB — TSH: TSH: 16.964 u[IU]/mL — ABNORMAL HIGH (ref 0.320–4.118)

## 2021-04-13 MED ORDER — SODIUM CHLORIDE 0.9 % IV SOLN
600.0000 mg | Freq: Once | INTRAVENOUS | Status: AC
  Administered 2021-04-13: 600 mg via INTRAVENOUS
  Filled 2021-04-13: qty 60

## 2021-04-13 MED ORDER — FAMOTIDINE 20 MG IN NS 100 ML IVPB
INTRAVENOUS | Status: AC
  Filled 2021-04-13: qty 100

## 2021-04-13 MED ORDER — CYANOCOBALAMIN 1000 MCG/ML IJ SOLN
1000.0000 ug | Freq: Once | INTRAMUSCULAR | Status: AC
  Administered 2021-04-13: 1000 ug via INTRAMUSCULAR

## 2021-04-13 MED ORDER — FAMOTIDINE 20 MG IN NS 100 ML IVPB
20.0000 mg | Freq: Once | INTRAVENOUS | Status: AC
  Administered 2021-04-13: 20 mg via INTRAVENOUS

## 2021-04-13 MED ORDER — DEXAMETHASONE SODIUM PHOSPHATE 100 MG/10ML IJ SOLN
10.0000 mg | Freq: Once | INTRAMUSCULAR | Status: AC
Start: 2021-04-13 — End: 2021-04-13
  Administered 2021-04-13: 10 mg via INTRAVENOUS
  Filled 2021-04-13: qty 10

## 2021-04-13 MED ORDER — PALONOSETRON HCL INJECTION 0.25 MG/5ML
INTRAVENOUS | Status: AC
  Filled 2021-04-13: qty 5

## 2021-04-13 MED ORDER — PALONOSETRON HCL INJECTION 0.25 MG/5ML
0.2500 mg | Freq: Once | INTRAVENOUS | Status: AC
  Administered 2021-04-13: 0.25 mg via INTRAVENOUS

## 2021-04-13 MED ORDER — SODIUM CHLORIDE 0.9% FLUSH
10.0000 mL | Freq: Once | INTRAVENOUS | Status: AC
  Administered 2021-04-13: 10 mL
  Filled 2021-04-13: qty 10

## 2021-04-13 MED ORDER — LORATADINE 10 MG PO TABS
ORAL_TABLET | ORAL | Status: AC
  Filled 2021-04-13: qty 1

## 2021-04-13 MED ORDER — SODIUM CHLORIDE 0.9 % IV SOLN
Freq: Once | INTRAVENOUS | Status: AC
  Filled 2021-04-13: qty 250

## 2021-04-13 MED ORDER — SODIUM CHLORIDE 0.9 % IV SOLN
500.0000 mg/m2 | Freq: Once | INTRAVENOUS | Status: AC
  Administered 2021-04-13: 1100 mg via INTRAVENOUS
  Filled 2021-04-13: qty 40

## 2021-04-13 MED ORDER — CYANOCOBALAMIN 1000 MCG/ML IJ SOLN
INTRAMUSCULAR | Status: AC
  Filled 2021-04-13: qty 1

## 2021-04-13 MED ORDER — SODIUM CHLORIDE 0.9% FLUSH
10.0000 mL | INTRAVENOUS | Status: DC | PRN
  Administered 2021-04-13: 10 mL
  Filled 2021-04-13: qty 10

## 2021-04-13 MED ORDER — HEPARIN SOD (PORK) LOCK FLUSH 100 UNIT/ML IV SOLN
500.0000 [IU] | Freq: Once | INTRAVENOUS | Status: AC | PRN
  Administered 2021-04-13: 500 [IU]
  Filled 2021-04-13: qty 5

## 2021-04-13 MED ORDER — LORATADINE 10 MG PO TABS
10.0000 mg | ORAL_TABLET | Freq: Once | ORAL | Status: AC
  Administered 2021-04-13: 10 mg via ORAL

## 2021-04-13 MED ORDER — SODIUM CHLORIDE 0.9 % IV SOLN
150.0000 mg | Freq: Once | INTRAVENOUS | Status: AC
  Administered 2021-04-13: 150 mg via INTRAVENOUS
  Filled 2021-04-13: qty 150

## 2021-04-13 NOTE — Progress Notes (Signed)
Adding famotidine 20 mg IVPB and Claritin 10 mg po x 1 as premedication for carboplatin  T.O. Dr Mohamed/Lille Karim Ronnald Ramp, PharmD

## 2021-04-13 NOTE — Patient Instructions (Signed)
Steps to Quit Smoking Smoking tobacco is the leading cause of preventable death. It can affect almost every organ in the body. Smoking puts you and people around you at risk for many serious, long-lasting (chronic) diseases. Quitting smoking can be hard, but it is one of the best things that you can do for your health. It is never too late to quit. How do I get ready to quit? When you decide to quit smoking, make a plan to help you succeed. Before you quit: Pick a date to quit. Set a date within the next 2 weeks to give you time to prepare. Write down the reasons why you are quitting. Keep this list in places where you will see it often. Tell your family, friends, and co-workers that you are quitting. Their support is important. Talk with your doctor about the choices that may help you quit. Find out if your health insurance will pay for these treatments. Know the people, places, things, and activities that make you want to smoke (triggers). Avoid them. What first steps can I take to quit smoking? Throw away all cigarettes at home, at work, and in your car. Throw away the things that you use when you smoke, such as ashtrays and lighters. Clean your car. Make sure to empty the ashtray. Clean your home, including curtains and carpets. What can I do to help me quit smoking? Talk with your doctor about taking medicines and seeing a counselor at the same time. You are more likely to succeed when you do both. If you are pregnant or breastfeeding, talk with your doctor about counseling or other ways to quit smoking. Do not take medicine to help you quit smoking unless your doctor tells you to do so. To quit smoking: Quit right away Quit smoking totally, instead of slowly cutting back on how much you smoke over a period of time. Go to counseling. You are more likely to quit if you go to counseling sessions regularly. Take medicine You may take medicines to help you quit. Some medicines need a  prescription, and some you can buy over-the-counter. Some medicines may contain a drug called nicotine to replace the nicotine in cigarettes. Medicines may: Help you to stop having the desire to smoke (cravings). Help to stop the problems that come when you stop smoking (withdrawal symptoms). Your doctor may ask you to use: Nicotine patches, gum, or lozenges. Nicotine inhalers or sprays. Non-nicotine medicine that is taken by mouth. Find resources Find resources and other ways to help you quit smoking and remain smoke-free after you quit. These resources are most helpful when you use them often. They include: Online chats with a counselor. Phone quitlines. Printed self-help materials. Support groups or group counseling. Text messaging programs. Mobile phone apps. Use apps on your mobile phone or tablet that can help you stick to your quit plan. There are many free apps for mobile phones and tablets as well as websites. Examples include Quit Guide from the CDC and smokefree.gov  What things can I do to make it easier to quit?  Talk to your family and friends. Ask them to support and encourage you. Call a phone quitline (1-800-QUIT-NOW), reach out to support groups, or work with a counselor. Ask people who smoke to not smoke around you. Avoid places that make you want to smoke, such as: Bars. Parties. Smoke-break areas at work. Spend time with people who do not smoke. Lower the stress in your life. Stress can make you want to   smoke. Try these things to help your stress: Getting regular exercise. Doing deep-breathing exercises. Doing yoga. Meditating. Doing a body scan. To do this, close your eyes, focus on one area of your body at a time from head to toe. Notice which parts of your body are tense. Try to relax the muscles in those areas. How will I feel when I quit smoking? Day 1 to 3 weeks Within the first 24 hours, you may start to have some problems that come from quitting tobacco.  These problems are very bad 2-3 days after you quit, but they do not often last for more than 2-3 weeks. You may get these symptoms: Mood swings. Feeling restless, nervous, angry, or annoyed. Trouble concentrating. Dizziness. Strong desire for high-sugar foods and nicotine. Weight gain. Trouble pooping (constipation). Feeling like you may vomit (nausea). Coughing or a sore throat. Changes in how the medicines that you take for other issues work in your body. Depression. Trouble sleeping (insomnia). Week 3 and afterward After the first 2-3 weeks of quitting, you may start to notice more positive results, such as: Better sense of smell and taste. Less coughing and sore throat. Slower heart rate. Lower blood pressure. Clearer skin. Better breathing. Fewer sick days. Quitting smoking can be hard. Do not give up if you fail the first time. Some people need to try a few times before they succeed. Do your best to stick to your quit plan, and talk with your doctor if you have any questions or concerns. Summary Smoking tobacco is the leading cause of preventable death. Quitting smoking can be hard, but it is one of the best things that you can do for your health. When you decide to quit smoking, make a plan to help you succeed. Quit smoking right away, not slowly over a period of time. When you start quitting, seek help from your doctor, family, or friends. This information is not intended to replace advice given to you by your health care provider. Make sure you discuss any questions you have with your health care provider. Document Revised: 06/08/2019 Document Reviewed: 12/02/2018 Elsevier Patient Education  2022 Elsevier Inc.  

## 2021-04-13 NOTE — Patient Instructions (Signed)
[facility]  Discharge Instructions: Thank you for choosing [facility] to provide your oncology and hematology care.   If you have a lab appointment with the [facility], please go directly to the [facility] and check in at the registration area.   Wear comfortable clothing and clothing appropriate for easy access to any Portacath or PICC line.   We strive to give you quality time with your provider. You may need to reschedule your appointment if you arrive late (15 or more minutes).  Arriving late affects you and other patients whose appointments are after yours.  Also, if you miss three or more appointments without notifying the office, you may be dismissed from the clinic at the provider's discretion.      For prescription refill requests, have your pharmacy contact our office and allow 72 hours for refills to be completed.    Today you received the following chemotherapy and/or immunotherapy agents : Alimta, Carboplatin     To help prevent nausea and vomiting after your treatment, we encourage you to take your nausea medication as directed.  BELOW ARE SYMPTOMS THAT SHOULD BE REPORTED IMMEDIATELY: *FEVER GREATER THAN 100.4 F (38 C) OR HIGHER *CHILLS OR SWEATING *NAUSEA AND VOMITING THAT IS NOT CONTROLLED WITH YOUR NAUSEA MEDICATION *UNUSUAL SHORTNESS OF BREATH *UNUSUAL BRUISING OR BLEEDING *URINARY PROBLEMS (pain or burning when urinating, or frequent urination) *BOWEL PROBLEMS (unusual diarrhea, constipation, pain near the anus) TENDERNESS IN MOUTH AND THROAT WITH OR WITHOUT PRESENCE OF ULCERS (sore throat, sores in mouth, or a toothache) UNUSUAL RASH, SWELLING OR PAIN  UNUSUAL VAGINAL DISCHARGE OR ITCHING   Items with * indicate a potential emergency and should be followed up as soon as possible or go to the Emergency Department if any problems should occur.  Please show the CHEMOTHERAPY ALERT CARD or IMMUNOTHERAPY ALERT CARD at  check-in to the Emergency Department and triage nurse.  Should you have questions after your visit or need to cancel or reschedule your appointment, please contact [facility]  Dept: [phone]  and follow the prompts.  Office hours are 8:00 a.m. to 4:30 p.m. Monday - Friday. Please note that voicemails left after 4:00 p.m. may not be returned until the following business day.  We are closed weekends and major holidays. You have access to a nurse at all times for urgent questions. Please call the main number to the clinic Dept: [phone] and follow the prompts.   For any non-urgent questions, you may also contact your provider using MyChart. We now offer e-Visits for anyone 35 and older to request care online for non-urgent symptoms. For details visit [email or web address].   Also download the MyChart app! Go to the app store, search "MyChart", open the app, select [facility], and log in with your MyChart username and password.  Due to Covid, a mask is required upon entering the hospital/clinic. If you do not have a mask, one will be given to you upon arrival. For doctor visits, patients may have 1 support person aged 35 or older with them. For treatment visits, patients cannot have anyone with them due to current Covid guidelines and our immunocompromised population.

## 2021-04-13 NOTE — Progress Notes (Signed)
[facility] [phone]   [phone]  OFFICE PROGRESS NOTE  Redmond School, MD [address] [place] [address]  DIAGNOSIS: stage IIIA (T3, N2, M0) non-small cell lung cancer, adenosquamous carcinoma diagnosed in June 2019 and presented with large right upper lobe lung mass with questionable chest wall invasion as well as right hilar and mediastinal lymphadenopathy.  Biomarker Findings Tumor Mutational Burden - TMB-High (21 Muts/Mb) Microsatellite status - MS-Stable Genomic Findings For a complete list of the genes assayed, please refer to the Appendix. ATM V5643* CCND2 P281R KRAS G12V CHEK2 T390fs*15 TP53 E298* 7 Disease relevant genes with no reportable alterations: ALK, EGFR, BRAF, MET, RET, ERBB2, ROS1  PDL1 Expression: 99%.   PRIOR THERAPY:  1) Concurrent chemoradiation with chemotherapy consisting of weekly carboplatin for an AUC of 2 and paclitaxel 45 mg/m2.  First dose given on 04/03/2018.  Status post 6 cycles.  Last dose was giving 05/15/2018 with partial response. 2) Consolidation treatment with immunotherapy with Imfinzi (Durvalumab) 10 mg/KG every 2 weeks.  First dose 06/20/2018.  Status post 26 cycles. 3) Systemic treatment with immunotherapy with Keytruda 200 mg IV every 3 weeks.  First dose February 27, 2020. Status post 12 cycles. Discontinued due to disease progression.   CURRENT THERAPY:  Carboplatin for an AUC of 5 and Alimta 500 mg per metered squared on days 1 IV every 3 weeks.  First dose expected on 03/03/2021. Status post 2 cycles.   INTERVAL HISTORY: [name] 60 y.o. male returns to the clinic today for follow-up visit.  The patient is feeling much better today.  He had cardiac arrest after the second cycle of his treatment when he was in the bathroom.  He was resuscitated and survive this event.  He was not feeling fine the day with hypotension and he took a lot of blood pressure medication before his treatment.   He denied having any current chest pain, shortness of breath, cough or hemoptysis.  He denied having any fever or chills.  He has no nausea, vomiting, diarrhea or constipation.  He has no headache or visual changes.  He would like to change his CODE STATUS to be full code.  He is here today for evaluation before starting cycle #3 of his treatment.   MEDICAL HISTORY: Past Medical History:  Diagnosis Date   Acute hepatitis C virus infection 06/19/2007   Qualifier: Diagnosis of  By: Leilani Merl CMA, Tiffany     Acute ST elevation myocardial infarction (STEMI) involving left anterior descending (LAD) coronary artery (HCC) 05/26/2016   Acute ST elevation myocardial infarction (STEMI) of lateral wall (HCC) 05/26/2016   AICD (automatic cardioverter/defibrillator) present 03/02/2018   Anxiety    Asthma    Atherosclerosis of native arteries of the extremities with intermittent claudication 12/16/2011   Chronic hepatitis C without hepatic coma ([facility]) 05/03/2014   COPD with chronic bronchitis and emphysema ([facility]) 03/13/2018   FEV1 57%   Depression    DVT (deep venous thrombosis) ([facility]) 2009; 2019   RLE; LLE   Encounter for antineoplastic chemotherapy 03/22/2018   Hepatitis C    "tx'd in 2015"   High cholesterol    Ischemic cardiomyopathy 03/02/2018   Non-small cell carcinoma of right lung, stage 3 ([facility]) 03/22/2018   PAD (peripheral artery disease) ([facility]) 01/25/2013   Peripheral vascular disease, unspecified 07/20/2012   Port-A-Cath in place 04/24/2018   ST elevation myocardial infarction involving left anterior descending (LAD) coronary artery ([facility])  STEMI (ST elevation myocardial infarction) ([facility]) 05/26/2016   Tobacco abuse 01/28/2016   Tubular adenoma of colon 07/11/2014   Urinary dribbling     ALLERGIES:  is allergic to lyrica [pregabalin] and neurontin [gabapentin].  MEDICATIONS:  Current Outpatient Medications  Medication Sig Dispense Refill   albuterol (VENTOLIN HFA) 108 (90 Base) MCG/ACT inhaler Inhale  1-2 puffs into the lungs every 4 (four) hours as needed for wheezing or shortness of breath.     alprazolam (XANAX) 2 MG tablet Take 2 mg by mouth 4 (four) times daily as needed for anxiety.     amitriptyline (ELAVIL) 100 MG tablet Take 100 mg by mouth at bedtime.      aspirin 81 MG EC tablet Take 81 mg by mouth daily.     atorvastatin (LIPITOR) 80 MG tablet Take 1 tablet (80 mg total) by mouth daily at 6 PM. 30 tablet 12   citalopram (CELEXA) 40 MG tablet Take 40 mg by mouth daily.  11   dexamethasone (DECADRON) 4 MG tablet Please take 1 tablet twice a day the day before, the day of, and the day after treatment 40 tablet 2   folic acid (FOLVITE) 1 MG tablet Take 1 tablet (1 mg total) by mouth daily. 30 tablet 2   HYDROcodone-acetaminophen (NORCO) 10-325 MG tablet Take 1 tablet by mouth every 4 (four) hours as needed for moderate pain.   0   lacosamide (VIMPAT) 50 MG TABS tablet Take 1 tablet (50 mg total) by mouth 2 (two) times daily. 60 tablet 0   levothyroxine (SYNTHROID) 125 MCG tablet Take 125 mcg by mouth daily.     lidocaine-prilocaine (EMLA) cream Apply 1 application topically as needed. 30 g 0   midodrine (PROAMATINE) 10 MG tablet Take 1 tablet (10 mg total) by mouth 3 (three) times daily with meals. 90 tablet 1   nitroGLYCERIN (NITROSTAT) 0.4 MG SL tablet Place 1 tablet (0.4 mg total) under the tongue every 5 (five) minutes x 3 doses as needed for chest pain. 25 tablet 2   Nutritional Supplements (,FEEDING SUPPLEMENT, PROSOURCE PLUS) liquid Take 30 mLs by mouth 2 (two) times daily between meals. 1800 mL 0   predniSONE (DELTASONE) 10 MG tablet Take  30 mg daily for 1 day, 20 mg daily for 1 days,10 mg daily for 1 day, then stop 6 tablet 0   prochlorperazine (COMPAZINE) 10 MG tablet Take 1 tablet (10 mg total) by mouth every 6 (six) hours as needed for nausea or vomiting. 30 tablet 2   sucralfate (CARAFATE) 1 g tablet Take 1 tablet (1 g total) by mouth 4 (four) times daily -  with meals and  at bedtime. 5 min before meals for radiation induced esophagitis 120 tablet 2   tamsulosin (FLOMAX) 0.4 MG CAPS capsule Take 0.4 mg by mouth daily.     TRELEGY ELLIPTA 100-62.5-25 MCG/INH AEPB Inhale 1 puff into the lungs daily.     No current facility-administered medications for this visit.    SURGICAL HISTORY:  Past Surgical History:  Procedure Laterality Date   AORTOGRAM  08/08/2009   for LLE claudication     By Dr. 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   BIOPSY N/A 05/30/2014   Procedure: BIOPSY;  Surgeon: [name], MD;  Location: [facility];  Service: Endoscopy;  Laterality: N/A;   BLADDER TUMOR EXCISION  8/812   CARDIAC CATHETERIZATION N/A 05/26/2016   Procedure: Left Heart Cath and Coronary Angiography;  Surgeon: Joyice Faster  Claiborne Billings, MD;  Location: [facility];  Service: Cardiovascular;  Laterality: N/A;   CARDIAC CATHETERIZATION N/A 05/26/2016   Procedure: Coronary Stent Intervention;  Surgeon: [name], MD;  Location: [facility];  Service: Cardiovascular;  Laterality: N/A;   COLONOSCOPY WITH PROPOFOL N/A 05/30/2014   TKW:IOXBDZ colonic polyp-likely source of hematochezia-removed as described above   ESOPHAGOGASTRODUODENOSCOPY (EGD) WITH PROPOFOL N/A 05/30/2014   HGD:JMEQAS and bulbar erosions s/p gastric biopsy. No evidence of portal gastropathy on today's examination.   FEMORAL-TIBIAL BYPASS GRAFT  2009   Right side using non-reversed GSV   By Dr. Anson [place] BYPASS GRAFT  11/24/2007   Right femoral to anterior tibial BPG   by Dr. Oneida Alar   FINGER SURGERY Left    "straightened my pinky"   HAND TENDON SURGERY Left 2013   Left 5th finger   HERNIA REPAIR     "stomach"   ICD IMPLANT N/A 03/02/2018   Procedure: ICD IMPLANT;  Surgeon: [name], MD;  Location: [facility];  Service: Cardiovascular;  Laterality: N/A;   INCISIONAL HERNIA REPAIR N/A 12/25/2014   Procedure: HERNIA REPAIR INCISIONAL WITH MESH;  Surgeon: [name], MD;  Location: [facility];  Service: General;  Laterality: N/A;   INSERTION OF MESH N/A 12/25/2014   Procedure: INSERTION OF MESH;  Surgeon: [name], MD;  Location: [facility];  Service: General;  Laterality: N/A;   IR IMAGING GUIDED PORT INSERTION  04/11/2018   LAPAROSCOPIC CHOLECYSTECTOMY     LOWER EXTREMITY ANGIOGRAM Left 12/30/2015   Procedure: Lower Extremity Angiogram;  Surgeon: [name], MD;  Location: [facility];  Service: Cardiovascular;  Laterality: Left;   PERIPHERAL VASCULAR CATHETERIZATION N/A 12/30/2015   Procedure: Abdominal Aortogram;  Surgeon: [name], MD;  Location: [facility];  Service: Cardiovascular;  Laterality: N/A;   POLYPECTOMY  05/30/2014   Procedure: POLYPECTOMY;  Surgeon: [name], MD;  Location: [facility];  Service: Endoscopy;;   TONSILLECTOMY AND ADENOIDECTOMY  ~ 1970   TYMPANOSTOMY TUBE PLACEMENT Bilateral ~ 1970    REVIEW OF SYSTEMS:  Constitutional: positive for fatigue Eyes: negative Ears, nose, mouth, throat, and face: negative Respiratory: negative Cardiovascular: negative Gastrointestinal: negative Genitourinary:negative Integument/breast: negative Hematologic/lymphatic: negative Musculoskeletal:positive for muscle weakness Neurological: negative Behavioral/Psych: negative Endocrine: negative Allergic/Immunologic: negative   PHYSICAL EXAMINATION: General appearance: alert, cooperative, fatigued, and no distress Head: Normocephalic, without obvious abnormality, atraumatic Neck: no adenopathy, no JVD, supple, symmetrical, trachea midline, and thyroid not enlarged, symmetric, no tenderness/mass/nodules Lymph nodes: Cervical, supraclavicular, and axillary nodes normal. Resp: clear to auscultation bilaterally Back: symmetric, no curvature. ROM normal. No CVA tenderness. Cardio: regular rate and rhythm, S1, S2 normal, no murmur, click, rub or gallop GI: soft, non-tender; bowel sounds normal; no masses,  no  organomegaly Extremities: Right below knee amputation otherwise no edema. Neurologic: Alert and oriented X 3, normal strength and tone. Normal symmetric reflexes. Normal coordination and gait  ECOG PERFORMANCE STATUS: 1 - Symptomatic but completely ambulatory  Blood pressure 93/65, pulse (!) 110, temperature 98.2 F (36.8 C), temperature source Tympanic, resp. rate 18, SpO2 100 %.  LABORATORY DATA: Lab Results  Component Value Date   WBC 8.2 03/27/2021   HGB 8.7 (L) 03/27/2021   HCT 27.6 (L) 03/27/2021   MCV 82.4 03/27/2021   PLT 253 03/27/2021      Chemistry      Component Value Date/Time   NA 130 (L) 03/27/2021 0415  NA 139 02/27/2018 1457   K 4.1 03/27/2021 0415   CL 95 (L) 03/27/2021 0415   CO2 28 03/27/2021 0415   BUN 24 (H) 03/27/2021 0415   BUN 7 02/27/2018 1457   CREATININE 0.84 03/27/2021 0415   CREATININE 0.84 03/24/2021 0938   CREATININE 1.13 06/28/2016 1453      Component Value Date/Time   CALCIUM 8.5 (L) 03/27/2021 0415   ALKPHOS 121 03/24/2021 1337   AST 43 (H) 03/24/2021 1337   AST 41 03/24/2021 0938   ALT 42 03/24/2021 1337   ALT 42 03/24/2021 0938   BILITOT 0.7 03/24/2021 1337   BILITOT 0.3 03/24/2021 0938       RADIOGRAPHIC STUDIES: CT HEAD WO CONTRAST  Result Date: 03/28/2021 CLINICAL DATA:  Seizure.  Status post fall. EXAM: CT HEAD WITHOUT CONTRAST TECHNIQUE: Contiguous axial images were obtained from the base of the skull through the vertex without intravenous contrast. COMPARISON:  03/27/2021 FINDINGS: Brain: No evidence of acute infarction, hemorrhage, hydrocephalus, extra-axial collection or mass lesion/mass effect. Vascular: No hyperdense vessel or unexpected calcification. Skull: Normal. Negative for fracture or focal lesion. Sinuses/Orbits: The paranasal sinuses and mastoid air cells are clear. Other: None IMPRESSION: No acute intracranial abnormalities. Normal brain. Electronically Signed   By: Kerby Moors M.D.   On: 03/28/2021 17:51    CT HEAD WO CONTRAST  Result Date: 03/27/2021 CLINICAL DATA:  Seizure EXAM: CT HEAD WITHOUT CONTRAST TECHNIQUE: Contiguous axial images were obtained from the base of the skull through the vertex without intravenous contrast. COMPARISON:  MRI 12/17/2020, CT brain 06/28/2018 FINDINGS: Brain: No acute territorial infarction, hemorrhage, or intracranial mass. The ventricles are nonenlarged. Mild atrophy. Vascular: No hyperdense vessels.  No unexpected calcification Skull: Normal. Negative for fracture or focal lesion. Sinuses/Orbits: No acute finding. Other: None IMPRESSION: Negative non contrasted CT appearance of the brain Electronically Signed   By: Donavan Foil M.D.   On: 03/27/2021 18:42   DG Chest Portable 1 View  Result Date: 03/24/2021 CLINICAL DATA:  post intubation EXAM: PORTABLE CHEST 1 VIEW COMPARISON:  Chest CT 02/20/2021, radiograph 03/21/2018 FINDINGS: Endotracheal tube tip overlies the mid trachea. There is a chest port catheter with tip overlying the superior cavoatrial junction. Unchanged single lead AICD. Defibrillator pads overlie the right and left chest. There is a nasogastric tube with side port and tip overlying the stomach. Unchanged cardiomediastinal silhouette. Right apical consolidation with cavitary change consistent with findings on recent chest CT. There are no other areas of visible airspace consolidation. There is no large pleural effusion or visible pneumothorax. IMPRESSION: Endotracheal tube tip overlies the mid trachea. Right apical consolidation with cavitary change consistent with findings on recent chest CT in May 2022. No other new focal airspace abnormalities. Electronically Signed   By: Maurine Simmering   On: 03/24/2021 14:55   EEG adult  Result Date: 03/29/2021 Lora Havens, MD     03/29/2021 10:14 AM Patient Name: [name] MRN: [id number] Epilepsy Attending: Lora Havens Referring Physician/Provider: Dr Donnetta Simpers Date: 03/29/2021 Duration: 22.31 mins  Patient history: 60 y.o. male with PMH significant for seizures, HFrEF due to ischemic cardiomyopathy, non-small cell carcinoma of the right lung- stage III, hyperlipidemia, peripheral arterial disease s/p right BKA who was admitted to [facility] after cardiac arrest. EEG to evaluate for seizure Level of alertness: Awake AEDs during EEG study: LEV Technical aspects: This EEG study was done with scalp electrodes positioned according to the 10-20 International system of electrode placement. Electrical activity  was acquired at a sampling rate of $Remov'500Hz'eRIJOO$  and reviewed with a high frequency filter of $RemoveB'70Hz'zotYTpgA$  and a low frequency filter of $RemoveB'1Hz'JMyWEaDs$ . EEG data were recorded continuously and digitally stored. Description: The posterior dominant rhythm consists of 8-9 Hz activity of moderate voltage (25-35 uV) seen predominantly in posterior head regions, symmetric and reactive to eye opening and eye closing. Physiologic photic driving was not seen during photic stimulation.  Hyperventilation was not performed.   IMPRESSION: This study is within normal limits. No seizures or epileptiform discharges were seen throughout the recording. Lora Havens   ECHOCARDIOGRAM COMPLETE  Result Date: 03/24/2021    ECHOCARDIOGRAM REPORT   Patient Name:   [name] Date of Exam: 03/24/2021 Medical Rec #:  [id number]   Height:       72.0 in Accession #:    [id number]  Weight:       207.2 lb Date of Birth:  1960/12/21   BSA:          2.163 m Patient Age:    49 years    BP:           89/70 mmHg Patient Gender: M           HR:           79 bpm. Exam Location:  Inpatient Procedure: 2D Echo, Cardiac Doppler, Color Doppler and Intracardiac            Opacification Agent Indications:    [facility]) [[id number]]  History:        Patient has prior history of Echocardiogram examinations, most                 recent 02/07/2018. CHF, CAD and Previous Myocardial Infarction,                 Defibrillator, PAD and COPD; Risk Factors:Current Smoker.                  Cardiac arrest in cancer center. Cancer.  Sonographer:    Darlina Sicilian RDCS Referring Phys: [id number] [facility]  Sonographer Comments: Technically challenging study due to limited acoustic windows, suboptimal apical window, suboptimal parasternal window and suboptimal subcostal window. IMPRESSIONS  1. Left ventricular ejection fraction, by estimation, is 25-30%. The left ventricle has severely decreased function. Left ventricular endocardial border not optimally defined to evaluate regional wall motion (even with Definity contrast), but the apical  segments are severely hypokinetic/akinetic. Left ventricular diastolic parameters are consistent with Grade II diastolic dysfunction (pseudonormalization).  2. Right ventricular systolic function is normal. The right ventricular size is normal. Tricuspid regurgitation signal is inadequate for assessing PA pressure.  3. A small pericardial effusion is present. The pericardial effusion is anterior to the right ventricle. There is no evidence of cardiac tamponade.  4. The mitral valve is grossly normal. No evidence of mitral valve regurgitation.  5. The aortic valve is normal in structure. Aortic valve regurgitation is not visualized. Comparison(s): No significant change from prior study. Prior images reviewed side by side. Today's study is of poor technical quality FINDINGS  Left Ventricle: Left ventricular ejection fraction, by estimation, is 25 to 30%. The left ventricle has severely decreased function. Left ventricular endocardial border not optimally defined to evaluate regional wall motion. Definity contrast agent was given IV to delineate the left ventricular endocardial borders. The left ventricular internal cavity size was normal in size. There is no left ventricular hypertrophy. Left ventricular diastolic parameters are consistent with Grade II  diastolic dysfunction (pseudonormalization). Elevated left atrial pressure. Right Ventricle: The right ventricular  size is normal. Right vetricular wall thickness was not well visualized. Right ventricular systolic function is normal. Tricuspid regurgitation signal is inadequate for assessing PA pressure. Left Atrium: Left atrial size was not assessed. Right Atrium: Right atrial size was not assessed. Pericardium: A small pericardial effusion is present. The pericardial effusion is anterior to the right ventricle. There is no evidence of cardiac tamponade. Mitral Valve: The mitral valve is grossly normal. No evidence of mitral valve regurgitation. Tricuspid Valve: The tricuspid valve is not well visualized. Tricuspid valve regurgitation is not demonstrated. Aortic Valve: The aortic valve is normal in structure. Aortic valve regurgitation is not visualized. Pulmonic Valve: The pulmonic valve was not well visualized. Pulmonic valve regurgitation is not visualized. Aorta: The aortic root was not well visualized. IAS/Shunts: The interatrial septum was not assessed. Additional Comments: A device lead is visualized in the right ventricle.  LEFT VENTRICLE PLAX 2D LVOT diam:     2.10 cm      Diastology LVOT Area:     3.46 cm     LV e' medial:    5.65 cm/s                             LV E/e' medial:  8.6                             LV e' lateral:   5.34 cm/s LV Volumes (MOD)            LV E/e' lateral: 9.1 LV vol d, MOD A2C: 113.0 ml LV vol d, MOD A4C: 108.0 ml LV vol s, MOD A2C: 59.7 ml LV vol s, MOD A4C: 56.8 ml LV SV MOD A2C:     53.3 ml LV SV MOD A4C:     108.0 ml LV SV MOD BP:      52.6 ml  AORTA Ao Root diam: 3.40 cm MITRAL VALVE MV Area (PHT): 2.75 cm    SHUNTS MV Decel Time: 276 msec    Systemic Diam: 2.10 cm MV E velocity: 48.50 cm/s MV A velocity: 50.90 cm/s MV E/A ratio:  0.95 Mihai Croitoru MD Electronically signed by Sanda Klein MD Signature Date/Time: 03/24/2021/5:22:24 PM    Final     ASSESSMENT AND PLAN: This is a very pleasant 60 years old white male recently diagnosed with a stage IIIa non-small cell lung cancer,  adenosquamous carcinoma.  He underwent a course of concurrent chemoradiation with weekly carboplatin and paclitaxel status post 6 cycles with partial response.  He has no actionable mutation but PD-L1 expression is 99%. The patient completed treatment with consolidation immunotherapy with Imfinzi status post 26 cycles.  He tolerated this treatment well with no concerning adverse effects. The patient was on observation for several months but he developed disease progression. He underwent treatment with systemic immunotherapy with Keytruda 200 mg IV every 3 weeks status post 12 cycles.  He missed a few cycles of his treatment because of traveling to [place] to take care of his parents.   He tolerated this treatment well but unfortunately had evidence for disease progression. He started systemic chemotherapy with carboplatin for AUC of 5 and Alimta 500 Mg/M2 status post 2 cycles.  The patient has been tolerating his systemic chemotherapy fairly well except for the last episode after cycle #2 when he had cardiac  arrest of unclear etiology and he survived that event and discharged from the hospital few days later. The patient is feeling much better today.  He was given the option of close observation and monitoring versus proceeding with cycle #3 as planned.  The patient would like to continue with his systemic chemotherapy and will proceed with cycle #3 today but I will reduce the dose of carboplatin to AUC of 4 starting from the cycle. We will also add Claritin and Pepcid to his premedication. For the hypothyroidism, he is currently on levothyroxine 125 mcg p.o. daily and will continue to monitor his TSH closely and adjust his dose as needed. The patient will come back for follow-up visit in 3 weeks for evaluation with repeat CT scan of the chest, abdomen and pelvis for restaging of his disease. He was advised to call immediately if he has any concerning symptoms in the interval. CODE STATUS, full code based on  his request The patient voices understanding of current disease status and treatment options and is in agreement with the current care plan. All questions were answered. The patient knows to call the clinic with any problems, questions or concerns. We can certainly see the patient much sooner if necessary.   Disclaimer: This note was dictated with voice recognition software. Similar sounding words can inadvertently be transcribed and may not be corrected upon review.

## 2021-04-16 ENCOUNTER — Telehealth: Payer: Self-pay | Admitting: Internal Medicine

## 2021-04-16 NOTE — Telephone Encounter (Signed)
Scheduled per los. Called and left msg. Mailed printout  °

## 2021-04-20 ENCOUNTER — Telehealth: Payer: Self-pay | Admitting: Medical Oncology

## 2021-04-20 NOTE — Telephone Encounter (Signed)
I returned wife's call. She asked for what happened when his heart stopped.I went over the events .

## 2021-04-21 ENCOUNTER — Other Ambulatory Visit: Payer: Self-pay | Admitting: Physician Assistant

## 2021-04-21 ENCOUNTER — Telehealth: Payer: Self-pay | Admitting: *Deleted

## 2021-04-21 ENCOUNTER — Inpatient Hospital Stay: Payer: Medicare Other

## 2021-04-21 ENCOUNTER — Other Ambulatory Visit: Payer: Self-pay

## 2021-04-21 ENCOUNTER — Telehealth: Payer: Self-pay | Admitting: Physician Assistant

## 2021-04-21 DIAGNOSIS — D6481 Anemia due to antineoplastic chemotherapy: Secondary | ICD-10-CM

## 2021-04-21 DIAGNOSIS — Z7982 Long term (current) use of aspirin: Secondary | ICD-10-CM | POA: Diagnosis not present

## 2021-04-21 DIAGNOSIS — C3411 Malignant neoplasm of upper lobe, right bronchus or lung: Secondary | ICD-10-CM | POA: Diagnosis not present

## 2021-04-21 DIAGNOSIS — C3491 Malignant neoplasm of unspecified part of right bronchus or lung: Secondary | ICD-10-CM

## 2021-04-21 DIAGNOSIS — Z79899 Other long term (current) drug therapy: Secondary | ICD-10-CM | POA: Diagnosis not present

## 2021-04-21 DIAGNOSIS — C3490 Malignant neoplasm of unspecified part of unspecified bronchus or lung: Secondary | ICD-10-CM

## 2021-04-21 DIAGNOSIS — E876 Hypokalemia: Secondary | ICD-10-CM

## 2021-04-21 DIAGNOSIS — Z95828 Presence of other vascular implants and grafts: Secondary | ICD-10-CM

## 2021-04-21 DIAGNOSIS — J449 Chronic obstructive pulmonary disease, unspecified: Secondary | ICD-10-CM | POA: Diagnosis not present

## 2021-04-21 DIAGNOSIS — Z5111 Encounter for antineoplastic chemotherapy: Secondary | ICD-10-CM | POA: Diagnosis not present

## 2021-04-21 DIAGNOSIS — T451X5A Adverse effect of antineoplastic and immunosuppressive drugs, initial encounter: Secondary | ICD-10-CM

## 2021-04-21 DIAGNOSIS — I252 Old myocardial infarction: Secondary | ICD-10-CM | POA: Diagnosis not present

## 2021-04-21 DIAGNOSIS — Z7952 Long term (current) use of systemic steroids: Secondary | ICD-10-CM | POA: Diagnosis not present

## 2021-04-21 DIAGNOSIS — Z95810 Presence of automatic (implantable) cardiac defibrillator: Secondary | ICD-10-CM | POA: Diagnosis not present

## 2021-04-21 DIAGNOSIS — F32A Depression, unspecified: Secondary | ICD-10-CM | POA: Diagnosis not present

## 2021-04-21 LAB — CBC WITH DIFFERENTIAL (CANCER CENTER ONLY)
Abs Immature Granulocytes: 0.04 10*3/uL (ref 0.00–0.07)
Basophils Absolute: 0 10*3/uL (ref 0.0–0.1)
Basophils Relative: 0 %
Eosinophils Absolute: 0 10*3/uL (ref 0.0–0.5)
Eosinophils Relative: 1 %
HCT: 23.4 % — ABNORMAL LOW (ref 39.0–52.0)
Hemoglobin: 7.7 g/dL — ABNORMAL LOW (ref 13.0–17.0)
Immature Granulocytes: 1 %
Lymphocytes Relative: 9 %
Lymphs Abs: 0.3 10*3/uL — ABNORMAL LOW (ref 0.7–4.0)
MCH: 27.4 pg (ref 26.0–34.0)
MCHC: 32.9 g/dL (ref 30.0–36.0)
MCV: 83.3 fL (ref 80.0–100.0)
Monocytes Absolute: 0.1 10*3/uL (ref 0.1–1.0)
Monocytes Relative: 2 %
Neutro Abs: 3 10*3/uL (ref 1.7–7.7)
Neutrophils Relative %: 87 %
Platelet Count: 140 10*3/uL — ABNORMAL LOW (ref 150–400)
RBC: 2.81 MIL/uL — ABNORMAL LOW (ref 4.22–5.81)
RDW: 20.6 % — ABNORMAL HIGH (ref 11.5–15.5)
WBC Count: 3.5 10*3/uL — ABNORMAL LOW (ref 4.0–10.5)
nRBC: 0 % (ref 0.0–0.2)

## 2021-04-21 LAB — CMP (CANCER CENTER ONLY)
ALT: 85 U/L — ABNORMAL HIGH (ref 0–44)
AST: 65 U/L — ABNORMAL HIGH (ref 15–41)
Albumin: 3.2 g/dL — ABNORMAL LOW (ref 3.5–5.0)
Alkaline Phosphatase: 105 U/L (ref 38–126)
Anion gap: 12 (ref 5–15)
BUN: 11 mg/dL (ref 6–20)
CO2: 31 mmol/L (ref 22–32)
Calcium: 8.5 mg/dL — ABNORMAL LOW (ref 8.9–10.3)
Chloride: 91 mmol/L — ABNORMAL LOW (ref 98–111)
Creatinine: 0.76 mg/dL (ref 0.61–1.24)
GFR, Estimated: >60 mL/min (ref >60)
Glucose, Bld: 112 mg/dL — ABNORMAL HIGH (ref 70–99)
Potassium: 3.2 mmol/L — ABNORMAL LOW (ref 3.5–5.1)
Sodium: 134 mmol/L — ABNORMAL LOW (ref 135–145)
Total Bilirubin: 0.7 mg/dL (ref 0.3–1.2)
Total Protein: 6.4 g/dL — ABNORMAL LOW (ref 6.5–8.1)

## 2021-04-21 MED ORDER — HEPARIN SOD (PORK) LOCK FLUSH 100 UNIT/ML IV SOLN
500.0000 [IU] | Freq: Once | INTRAVENOUS | Status: AC
  Administered 2021-04-21: 500 [IU]
  Filled 2021-04-21: qty 5

## 2021-04-21 MED ORDER — SODIUM CHLORIDE 0.9% FLUSH
10.0000 mL | Freq: Once | INTRAVENOUS | Status: AC
  Administered 2021-04-21: 10 mL
  Filled 2021-04-21: qty 10

## 2021-04-21 MED ORDER — POTASSIUM CHLORIDE CRYS ER 20 MEQ PO TBCR: 20.0000 meq | EXTENDED_RELEASE_TABLET | Freq: Every day | ORAL | 0 refills | Status: DC

## 2021-04-21 NOTE — Telephone Encounter (Signed)
Per Cassie Hellingoepter, PA, called pt wife to advise on pt receiving 2 units of blood and pt being taken to [facility] for infusion due to limited space at [facility] for transfusion. Pt wife declined to take him to [facility] for blood transfusion. Wife stated, "I've already cancelled my pap smear I just can't take him to [facility]." She prefers pt to have transfusion on next upcoming appt 8/2. Advised wife if pt is symptomatic to take to nearest ED. Wife stated husband isn't having any symptoms at the moment. Wife continued to express that taking pt to [facility] for blood transfusion isn't an option. This nurse advised pt wife again, if pt starts presenting with symptoms to take pt to the nearest ED. Provider was made aware and will send scheduling a message for appt. Pt wife verbalized understanding.

## 2021-04-21 NOTE — Telephone Encounter (Signed)
Scheduled appt per 7/26 sch msg. Pt's wife is aware.

## 2021-04-22 ENCOUNTER — Telehealth: Payer: Self-pay

## 2021-04-22 NOTE — Telephone Encounter (Signed)
Pt's wife, [name] called and LVM stating she needs someone to call her to let her know what is going on. I called pt's wife and she states she does not remember why she called. Confirmed upcoming appts with [name].

## 2021-04-28 ENCOUNTER — Inpatient Hospital Stay: Payer: Medicare Other

## 2021-04-28 ENCOUNTER — Other Ambulatory Visit: Payer: Self-pay

## 2021-04-28 ENCOUNTER — Other Ambulatory Visit: Payer: Medicare Other

## 2021-04-28 ENCOUNTER — Inpatient Hospital Stay: Payer: Medicare Other | Attending: Internal Medicine

## 2021-04-28 ENCOUNTER — Telehealth: Payer: Self-pay

## 2021-04-28 DIAGNOSIS — C3491 Malignant neoplasm of unspecified part of right bronchus or lung: Secondary | ICD-10-CM

## 2021-04-28 DIAGNOSIS — D6481 Anemia due to antineoplastic chemotherapy: Secondary | ICD-10-CM

## 2021-04-28 DIAGNOSIS — E039 Hypothyroidism, unspecified: Secondary | ICD-10-CM | POA: Insufficient documentation

## 2021-04-28 DIAGNOSIS — D61810 Antineoplastic chemotherapy induced pancytopenia: Secondary | ICD-10-CM | POA: Diagnosis not present

## 2021-04-28 DIAGNOSIS — T451X5A Adverse effect of antineoplastic and immunosuppressive drugs, initial encounter: Secondary | ICD-10-CM

## 2021-04-28 DIAGNOSIS — C3411 Malignant neoplasm of upper lobe, right bronchus or lung: Secondary | ICD-10-CM | POA: Diagnosis not present

## 2021-04-28 DIAGNOSIS — Z86718 Personal history of other venous thrombosis and embolism: Secondary | ICD-10-CM | POA: Diagnosis not present

## 2021-04-28 DIAGNOSIS — Z79899 Other long term (current) drug therapy: Secondary | ICD-10-CM | POA: Insufficient documentation

## 2021-04-28 DIAGNOSIS — Z5111 Encounter for antineoplastic chemotherapy: Secondary | ICD-10-CM | POA: Insufficient documentation

## 2021-04-28 DIAGNOSIS — Z7982 Long term (current) use of aspirin: Secondary | ICD-10-CM | POA: Diagnosis not present

## 2021-04-28 DIAGNOSIS — Z95828 Presence of other vascular implants and grafts: Secondary | ICD-10-CM

## 2021-04-28 LAB — CBC WITH DIFFERENTIAL (CANCER CENTER ONLY)
Abs Immature Granulocytes: 0.04 10*3/uL (ref 0.00–0.07)
Basophils Absolute: 0 10*3/uL (ref 0.0–0.1)
Basophils Relative: 1 %
Eosinophils Absolute: 0.1 10*3/uL (ref 0.0–0.5)
Eosinophils Relative: 8 %
HCT: 20.2 % — ABNORMAL LOW (ref 39.0–52.0)
Hemoglobin: 6.5 g/dL — CL (ref 13.0–17.0)
Immature Granulocytes: 4 %
Lymphocytes Relative: 43 %
Lymphs Abs: 0.5 10*3/uL — ABNORMAL LOW (ref 0.7–4.0)
MCH: 26.7 pg (ref 26.0–34.0)
MCHC: 32.2 g/dL (ref 30.0–36.0)
MCV: 83.1 fL (ref 80.0–100.0)
Monocytes Absolute: 0.3 10*3/uL (ref 0.1–1.0)
Monocytes Relative: 22 %
Neutro Abs: 0.3 10*3/uL — CL (ref 1.7–7.7)
Neutrophils Relative %: 22 %
Platelet Count: 50 10*3/uL — ABNORMAL LOW (ref 150–400)
RBC: 2.43 MIL/uL — ABNORMAL LOW (ref 4.22–5.81)
RDW: 20.7 % — ABNORMAL HIGH (ref 11.5–15.5)
WBC Count: 1.1 10*3/uL — ABNORMAL LOW (ref 4.0–10.5)
nRBC: 1.8 % — ABNORMAL HIGH (ref 0.0–0.2)

## 2021-04-28 LAB — CMP (CANCER CENTER ONLY)
ALT: 129 U/L — ABNORMAL HIGH (ref 0–44)
AST: 75 U/L — ABNORMAL HIGH (ref 15–41)
Albumin: 2.9 g/dL — ABNORMAL LOW (ref 3.5–5.0)
Alkaline Phosphatase: 139 U/L — ABNORMAL HIGH (ref 38–126)
Anion gap: 10 (ref 5–15)
BUN: 6 mg/dL (ref 6–20)
CO2: 32 mmol/L (ref 22–32)
Calcium: 8.3 mg/dL — ABNORMAL LOW (ref 8.9–10.3)
Chloride: 94 mmol/L — ABNORMAL LOW (ref 98–111)
Creatinine: 0.8 mg/dL (ref 0.61–1.24)
GFR, Estimated: >60 mL/min (ref >60)
Glucose, Bld: 102 mg/dL — ABNORMAL HIGH (ref 70–99)
Potassium: 3.6 mmol/L (ref 3.5–5.1)
Sodium: 136 mmol/L (ref 135–145)
Total Bilirubin: 0.3 mg/dL (ref 0.3–1.2)
Total Protein: 6.3 g/dL — ABNORMAL LOW (ref 6.5–8.1)

## 2021-04-28 LAB — PREPARE RBC (CROSSMATCH)

## 2021-04-28 LAB — SAMPLE TO BLOOD BANK

## 2021-04-28 MED ORDER — ACETAMINOPHEN 325 MG PO TABS
650.0000 mg | ORAL_TABLET | Freq: Once | ORAL | Status: AC
  Administered 2021-04-28: 650 mg via ORAL

## 2021-04-28 MED ORDER — HEPARIN SOD (PORK) LOCK FLUSH 100 UNIT/ML IV SOLN
500.0000 [IU] | Freq: Every day | INTRAVENOUS | Status: AC | PRN
  Administered 2021-04-28: 500 [IU]
  Filled 2021-04-28: qty 5

## 2021-04-28 MED ORDER — SODIUM CHLORIDE 0.9% FLUSH
10.0000 mL | Freq: Once | INTRAVENOUS | Status: AC
  Administered 2021-04-28: 10 mL
  Filled 2021-04-28: qty 10

## 2021-04-28 MED ORDER — SODIUM CHLORIDE 0.9% IV SOLUTION
250.0000 mL | Freq: Once | INTRAVENOUS | Status: AC
  Administered 2021-04-28: 250 mL via INTRAVENOUS
  Filled 2021-04-28: qty 250

## 2021-04-28 MED ORDER — ACETAMINOPHEN 325 MG PO TABS
ORAL_TABLET | ORAL | Status: AC
  Filled 2021-04-28: qty 2

## 2021-04-28 MED ORDER — DIPHENHYDRAMINE HCL 25 MG PO CAPS
25.0000 mg | ORAL_CAPSULE | Freq: Once | ORAL | Status: AC
  Administered 2021-04-28: 25 mg via ORAL

## 2021-04-28 MED ORDER — HEPARIN SOD (PORK) LOCK FLUSH 100 UNIT/ML IV SOLN
250.0000 [IU] | INTRAVENOUS | Status: DC | PRN
  Filled 2021-04-28: qty 5

## 2021-04-28 MED ORDER — SODIUM CHLORIDE 0.9% FLUSH
3.0000 mL | INTRAVENOUS | Status: DC | PRN
  Filled 2021-04-28: qty 10

## 2021-04-28 MED ORDER — SODIUM CHLORIDE 0.9% FLUSH
10.0000 mL | INTRAVENOUS | Status: AC | PRN
  Administered 2021-04-28: 10 mL
  Filled 2021-04-28: qty 10

## 2021-04-28 MED ORDER — DIPHENHYDRAMINE HCL 25 MG PO CAPS
ORAL_CAPSULE | ORAL | Status: AC
  Filled 2021-04-28: qty 1

## 2021-04-28 NOTE — Telephone Encounter (Signed)
CRITICAL VALUE STICKER  CRITICAL VALUE: Hgb = 6.5 and ANC = 0.3  RECEIVER (on-site recipient of call): Yetta Glassman, [place] NOTIFIED: 04/28/21 at 11:49am  MESSENGER (representative from lab): Pam  MD NOTIFIED: Dr. Julien Nordmann  TIME OF NOTIFICATION: 04/28/21 at 11:49am  RESPONSE: Per Dr. Julien Nordmann, pt needs 2 units of PRBCs, no tx today and will need Granix 443mcg for 3 days.

## 2021-04-28 NOTE — Patient Instructions (Signed)
Blood Transfusion, Adult, Care After This sheet gives you information about how to care for yourself after your procedure. Your doctor may also give you more specific instructions. If youhave problems or questions, contact your doctor. What can I expect after the procedure? After the procedure, it is common to have: Bruising and soreness at the IV site. A fever or chills on the day of the procedure. This may be your body's response to the new blood cells received. A headache. Follow these instructions at home: Insertion site care     Follow instructions from your doctor about how to take care of your insertion site. This is where an IV tube was put into your vein. Make sure you: Wash your hands with soap and water before and after you change your bandage (dressing). If you cannot use soap and water, use hand sanitizer. Change your bandage as told by your doctor. Check your insertion site every day for signs of infection. Check for: Redness, swelling, or pain. Bleeding from the site. Warmth. Pus or a bad smell. General instructions Take over-the-counter and prescription medicines only as told by your doctor. Rest as told by your doctor. Go back to your normal activities as told by your doctor. Keep all follow-up visits as told by your doctor. This is important. Contact a doctor if: You have itching or red, swollen areas of skin (hives). You feel worried or nervous (anxious). You feel weak after doing your normal activities. You have redness, swelling, warmth, or pain around the insertion site. You have blood coming from the insertion site, and the blood does not stop with pressure. You have pus or a bad smell coming from the insertion site. Get help right away if: You have signs of a serious reaction. This may be coming from an allergy or the body's defense system (immune system). Signs include: Trouble breathing or shortness of breath. Swelling of the face or feeling warm  (flushed). Fever or chills. Head, chest, or back pain. Dark pee (urine) or blood in the pee. Widespread rash. Fast heartbeat. Feeling dizzy or light-headed. You may receive your blood transfusion in an outpatient setting. If so, youwill be told whom to contact to report any reactions. These symptoms may be an emergency. Do not wait to see if the symptoms will go away. Get medical help right away. Call your local emergency services (911 in the [place] Do not drive yourself to the hospital. Summary Bruising and soreness at the IV site are common. Check your insertion site every day for signs of infection. Rest as told by your doctor. Go back to your normal activities as told by your doctor. Get help right away if you have signs of a serious reaction. This information is not intended to replace advice given to you by your health care provider. Make sure you discuss any questions you have with your healthcare provider. Document Revised: 03/08/2019 Document Reviewed: 03/08/2019 Elsevier Patient Education  2022 [employer]  

## 2021-04-29 ENCOUNTER — Other Ambulatory Visit: Payer: Self-pay | Admitting: Physician Assistant

## 2021-04-29 ENCOUNTER — Other Ambulatory Visit: Payer: Self-pay

## 2021-04-29 LAB — TYPE AND SCREEN
ABO/RH(D): A POS
Antibody Screen: NEGATIVE
Unit division: 0
Unit division: 0

## 2021-04-29 LAB — BPAM RBC
Blood Product Expiration Date: 202208252359
Blood Product Expiration Date: 202208252359
ISSUE DATE / TIME: 202208021246
ISSUE DATE / TIME: 202208021246
Unit Type and Rh: 6200
Unit Type and Rh: 6200

## 2021-05-01 ENCOUNTER — Encounter (HOSPITAL_COMMUNITY): Payer: Self-pay

## 2021-05-01 ENCOUNTER — Other Ambulatory Visit: Payer: Self-pay

## 2021-05-01 ENCOUNTER — Ambulatory Visit (HOSPITAL_COMMUNITY)
Admission: RE | Admit: 2021-05-01 | Discharge: 2021-05-01 | Disposition: A | Discharge status: Home / Self Care | Payer: Medicare Other | Source: Ambulatory Visit | Attending: Internal Medicine | Admitting: Internal Medicine

## 2021-05-01 DIAGNOSIS — I313 Pericardial effusion (noninflammatory): Secondary | ICD-10-CM | POA: Diagnosis not present

## 2021-05-01 DIAGNOSIS — C3490 Malignant neoplasm of unspecified part of unspecified bronchus or lung: Secondary | ICD-10-CM | POA: Insufficient documentation

## 2021-05-01 DIAGNOSIS — R918 Other nonspecific abnormal finding of lung field: Secondary | ICD-10-CM | POA: Diagnosis not present

## 2021-05-01 DIAGNOSIS — R634 Abnormal weight loss: Secondary | ICD-10-CM | POA: Diagnosis not present

## 2021-05-01 DIAGNOSIS — J439 Emphysema, unspecified: Secondary | ICD-10-CM | POA: Diagnosis not present

## 2021-05-01 DIAGNOSIS — R911 Solitary pulmonary nodule: Secondary | ICD-10-CM | POA: Diagnosis not present

## 2021-05-01 MED ORDER — IOHEXOL 350 MG/ML SOLN
80.0000 mL | Freq: Once | INTRAVENOUS | Status: AC | PRN
  Administered 2021-05-01: 80 mL via INTRAVENOUS

## 2021-05-01 MED ORDER — HEPARIN SOD (PORK) LOCK FLUSH 100 UNIT/ML IV SOLN
INTRAVENOUS | Status: AC
  Filled 2021-05-01: qty 5

## 2021-05-01 MED ORDER — HEPARIN SOD (PORK) LOCK FLUSH 100 UNIT/ML IV SOLN
500.0000 [IU] | Freq: Once | INTRAVENOUS | Status: AC
  Administered 2021-05-01: 500 [IU] via INTRAVENOUS

## 2021-05-05 ENCOUNTER — Inpatient Hospital Stay: Payer: Medicare Other

## 2021-05-05 ENCOUNTER — Inpatient Hospital Stay (HOSPITAL_BASED_OUTPATIENT_CLINIC_OR_DEPARTMENT_OTHER): Payer: Medicare Other | Admitting: Internal Medicine

## 2021-05-05 ENCOUNTER — Other Ambulatory Visit: Payer: Self-pay

## 2021-05-05 ENCOUNTER — Inpatient Hospital Stay: Payer: Medicare Other | Admitting: Nutrition

## 2021-05-05 VITALS — BP 107/78 | HR 100 | Temp 98.4°F | Resp 18 | Wt 182.4 lb

## 2021-05-05 DIAGNOSIS — T451X5A Adverse effect of antineoplastic and immunosuppressive drugs, initial encounter: Secondary | ICD-10-CM | POA: Diagnosis not present

## 2021-05-05 DIAGNOSIS — D61810 Antineoplastic chemotherapy induced pancytopenia: Secondary | ICD-10-CM | POA: Diagnosis not present

## 2021-05-05 DIAGNOSIS — Z86718 Personal history of other venous thrombosis and embolism: Secondary | ICD-10-CM | POA: Diagnosis not present

## 2021-05-05 DIAGNOSIS — C3490 Malignant neoplasm of unspecified part of unspecified bronchus or lung: Secondary | ICD-10-CM

## 2021-05-05 DIAGNOSIS — Z5112 Encounter for antineoplastic immunotherapy: Secondary | ICD-10-CM

## 2021-05-05 DIAGNOSIS — Z95828 Presence of other vascular implants and grafts: Secondary | ICD-10-CM

## 2021-05-05 DIAGNOSIS — C3491 Malignant neoplasm of unspecified part of right bronchus or lung: Secondary | ICD-10-CM

## 2021-05-05 DIAGNOSIS — E039 Hypothyroidism, unspecified: Secondary | ICD-10-CM | POA: Diagnosis not present

## 2021-05-05 DIAGNOSIS — C3411 Malignant neoplasm of upper lobe, right bronchus or lung: Secondary | ICD-10-CM | POA: Diagnosis not present

## 2021-05-05 DIAGNOSIS — Z5111 Encounter for antineoplastic chemotherapy: Secondary | ICD-10-CM

## 2021-05-05 DIAGNOSIS — Z79899 Other long term (current) drug therapy: Secondary | ICD-10-CM | POA: Diagnosis not present

## 2021-05-05 DIAGNOSIS — Z7982 Long term (current) use of aspirin: Secondary | ICD-10-CM | POA: Diagnosis not present

## 2021-05-05 LAB — SAMPLE TO BLOOD BANK

## 2021-05-05 LAB — CMP (CANCER CENTER ONLY)
ALT: 39 U/L (ref 0–44)
AST: 28 U/L (ref 15–41)
Albumin: 2.9 g/dL — ABNORMAL LOW (ref 3.5–5.0)
Alkaline Phosphatase: 135 U/L — ABNORMAL HIGH (ref 38–126)
Anion gap: 11 (ref 5–15)
BUN: 13 mg/dL (ref 6–20)
CO2: 30 mmol/L (ref 22–32)
Calcium: 8.4 mg/dL — ABNORMAL LOW (ref 8.9–10.3)
Chloride: 96 mmol/L — ABNORMAL LOW (ref 98–111)
Creatinine: 0.95 mg/dL (ref 0.61–1.24)
GFR, Estimated: >60 mL/min (ref >60)
Glucose, Bld: 124 mg/dL — ABNORMAL HIGH (ref 70–99)
Potassium: 3.7 mmol/L (ref 3.5–5.1)
Sodium: 137 mmol/L (ref 135–145)
Total Bilirubin: 0.2 mg/dL — ABNORMAL LOW (ref 0.3–1.2)
Total Protein: 6.6 g/dL (ref 6.5–8.1)

## 2021-05-05 LAB — CBC WITH DIFFERENTIAL (CANCER CENTER ONLY)
Abs Immature Granulocytes: 0.66 10*3/uL — ABNORMAL HIGH (ref 0.00–0.07)
Basophils Absolute: 0.1 10*3/uL (ref 0.0–0.1)
Basophils Relative: 1 %
Eosinophils Absolute: 0 10*3/uL (ref 0.0–0.5)
Eosinophils Relative: 0 %
HCT: 29.7 % — ABNORMAL LOW (ref 39.0–52.0)
Hemoglobin: 9.7 g/dL — ABNORMAL LOW (ref 13.0–17.0)
Immature Granulocytes: 7 %
Lymphocytes Relative: 6 %
Lymphs Abs: 0.6 10*3/uL — ABNORMAL LOW (ref 0.7–4.0)
MCH: 28 pg (ref 26.0–34.0)
MCHC: 32.7 g/dL (ref 30.0–36.0)
MCV: 85.6 fL (ref 80.0–100.0)
Monocytes Absolute: 1 10*3/uL (ref 0.1–1.0)
Monocytes Relative: 11 %
Neutro Abs: 6.7 10*3/uL (ref 1.7–7.7)
Neutrophils Relative %: 75 %
Platelet Count: 416 10*3/uL — ABNORMAL HIGH (ref 150–400)
RBC: 3.47 MIL/uL — ABNORMAL LOW (ref 4.22–5.81)
RDW: 21.3 % — ABNORMAL HIGH (ref 11.5–15.5)
WBC Count: 9 10*3/uL (ref 4.0–10.5)
nRBC: 0 % (ref 0.0–0.2)

## 2021-05-05 LAB — TSH: TSH: 6.69 u[IU]/mL — ABNORMAL HIGH (ref 0.320–4.118)

## 2021-05-05 MED ORDER — HEPARIN SOD (PORK) LOCK FLUSH 100 UNIT/ML IV SOLN
500.0000 [IU] | Freq: Once | INTRAVENOUS | Status: AC | PRN
  Administered 2021-05-05: 500 [IU]
  Filled 2021-05-05: qty 5

## 2021-05-05 MED ORDER — PALONOSETRON HCL INJECTION 0.25 MG/5ML
0.2500 mg | Freq: Once | INTRAVENOUS | Status: AC
  Administered 2021-05-05: 0.25 mg via INTRAVENOUS

## 2021-05-05 MED ORDER — SODIUM CHLORIDE 0.9 % IV SOLN
800.0000 mg | Freq: Once | INTRAVENOUS | Status: AC
  Administered 2021-05-05: 800 mg via INTRAVENOUS
  Filled 2021-05-05: qty 20

## 2021-05-05 MED ORDER — FAMOTIDINE 20 MG IN NS 100 ML IVPB
20.0000 mg | Freq: Once | INTRAVENOUS | Status: AC
  Administered 2021-05-05: 20 mg via INTRAVENOUS

## 2021-05-05 MED ORDER — SODIUM CHLORIDE 0.9 % IV SOLN
10.0000 mg | Freq: Once | INTRAVENOUS | Status: AC
  Administered 2021-05-05: 10 mg via INTRAVENOUS
  Filled 2021-05-05: qty 10

## 2021-05-05 MED ORDER — LORATADINE 10 MG PO TABS
10.0000 mg | ORAL_TABLET | Freq: Once | ORAL | Status: AC
  Administered 2021-05-05: 10 mg via ORAL

## 2021-05-05 MED ORDER — PALONOSETRON HCL INJECTION 0.25 MG/5ML
INTRAVENOUS | Status: AC
  Filled 2021-05-05: qty 5

## 2021-05-05 MED ORDER — LIDOCAINE-PRILOCAINE 2.5-2.5 % EX CREA: 1.0000 "application " | TOPICAL_CREAM | CUTANEOUS | 0 refills | Status: DC | PRN

## 2021-05-05 MED ORDER — SODIUM CHLORIDE 0.9 % IV SOLN
500.0000 mg | Freq: Once | INTRAVENOUS | Status: AC
  Administered 2021-05-05: 500 mg via INTRAVENOUS
  Filled 2021-05-05: qty 50

## 2021-05-05 MED ORDER — SODIUM CHLORIDE 0.9% FLUSH
10.0000 mL | INTRAVENOUS | Status: DC | PRN
Start: 2021-05-05 — End: 2021-05-05
  Administered 2021-05-05: 10 mL
  Filled 2021-05-05: qty 10

## 2021-05-05 MED ORDER — FAMOTIDINE 20 MG IN NS 100 ML IVPB
INTRAVENOUS | Status: AC
  Filled 2021-05-05: qty 100

## 2021-05-05 MED ORDER — ACETAMINOPHEN 325 MG PO TABS
ORAL_TABLET | ORAL | Status: AC
  Filled 2021-05-05: qty 2

## 2021-05-05 MED ORDER — ACETAMINOPHEN 325 MG PO TABS
650.0000 mg | ORAL_TABLET | Freq: Once | ORAL | Status: AC
  Administered 2021-05-05: 650 mg via ORAL

## 2021-05-05 MED ORDER — SODIUM CHLORIDE 0.9 % IV SOLN
150.0000 mg | Freq: Once | INTRAVENOUS | Status: AC
  Administered 2021-05-05: 150 mg via INTRAVENOUS
  Filled 2021-05-05: qty 150

## 2021-05-05 MED ORDER — SODIUM CHLORIDE 0.9 % IV SOLN: 400.0000 mg/m2 | Freq: Once | INTRAVENOUS | Status: DC

## 2021-05-05 MED ORDER — SODIUM CHLORIDE 0.9 % IV SOLN
Freq: Once | INTRAVENOUS | Status: AC
  Filled 2021-05-05: qty 250

## 2021-05-05 MED ORDER — LORATADINE 10 MG PO TABS
ORAL_TABLET | ORAL | Status: AC
  Filled 2021-05-05: qty 1

## 2021-05-05 MED ORDER — SODIUM CHLORIDE 0.9% FLUSH
10.0000 mL | Freq: Once | INTRAVENOUS | Status: AC
  Administered 2021-05-05: 10 mL
  Filled 2021-05-05: qty 10

## 2021-05-05 NOTE — Progress Notes (Signed)
Decrease Carboplatin to 500mg  (AUC 4) per MD.  Acquanetta Belling, RPH, BCPS, BCOP 05/05/2021 1:11 PM

## 2021-05-05 NOTE — Patient Instructions (Signed)
[facility]   Discharge Instructions: Thank you for choosing [facility] to provide your oncology and hematology care.   If you have a lab appointment with the [facility], please go directly to the [facility] and check in at the registration area.   Wear comfortable clothing and clothing appropriate for easy access to any Portacath or PICC line.   We strive to give you quality time with your provider. You may need to reschedule your appointment if you arrive late (15 or more minutes).  Arriving late affects you and other patients whose appointments are after yours.  Also, if you miss three or more appointments without notifying the office, you may be dismissed from the clinic at the provider's discretion.      For prescription refill requests, have your pharmacy contact our office and allow 72 hours for refills to be completed.    Today you received the following chemotherapy and/or immunotherapy agents: pemetrexed and carboplatin.      To help prevent nausea and vomiting after your treatment, we encourage you to take your nausea medication as directed.  BELOW ARE SYMPTOMS THAT SHOULD BE REPORTED IMMEDIATELY: *FEVER GREATER THAN 100.4 F (38 C) OR HIGHER *CHILLS OR SWEATING *NAUSEA AND VOMITING THAT IS NOT CONTROLLED WITH YOUR NAUSEA MEDICATION *UNUSUAL SHORTNESS OF BREATH *UNUSUAL BRUISING OR BLEEDING *URINARY PROBLEMS (pain or burning when urinating, or frequent urination) *BOWEL PROBLEMS (unusual diarrhea, constipation, pain near the anus) TENDERNESS IN MOUTH AND THROAT WITH OR WITHOUT PRESENCE OF ULCERS (sore throat, sores in mouth, or a toothache) UNUSUAL RASH, SWELLING OR PAIN  UNUSUAL VAGINAL DISCHARGE OR ITCHING   Items with * indicate a potential emergency and should be followed up as soon as possible or go to the Emergency Department if any problems should occur.  Please show the CHEMOTHERAPY ALERT CARD or IMMUNOTHERAPY ALERT  CARD at check-in to the Emergency Department and triage nurse.  Should you have questions after your visit or need to cancel or reschedule your appointment, please contact [facility]  Dept: [phone]  and follow the prompts.  Office hours are 8:00 a.m. to 4:30 p.m. Monday - Friday. Please note that voicemails left after 4:00 p.m. may not be returned until the following business day.  We are closed weekends and major holidays. You have access to a nurse at all times for urgent questions. Please call the main number to the clinic Dept: [phone] and follow the prompts.   For any non-urgent questions, you may also contact your provider using MyChart. We now offer e-Visits for anyone 63 and older to request care online for non-urgent symptoms. For details visit [email or web address].   Also download the MyChart app! Go to the app store, search "MyChart", open the app, select [facility], and log in with your MyChart username and password.  Due to Covid, a mask is required upon entering the hospital/clinic. If you do not have a mask, one will be given to you upon arrival. For doctor visits, patients may have 1 support person aged 49 or older with them. For treatment visits, patients cannot have anyone with them due to current Covid guidelines and our immunocompromised population.

## 2021-05-05 NOTE — Progress Notes (Signed)
Nutrition follow-up completed with patient during infusion for recurrent non-small cell lung cancer. Patient weighed 182.4 pounds August 9 down from 200.84 July 1. Patient denies nausea, vomiting, constipation, or diarrhea. Reports he generally drinks a Yahoo for breakfast and then does not usually eat until suppertime. States he does not like oral nutrition supplements.  He has tried them and reports they taste terrible.   He wonders if he can get Meals on Wheels.  Nutrition diagnosis: Unintended weight loss continues.  Intervention: Educated patient to try to consume smaller amounts of food more often.  Reviewed potential snacks and meals. Recommended patient add a regular milkshake once a day. Will ask about referral to Meals on Wheels.  Monitoring, evaluation, goals: Patient will tolerate increased calories and protein to minimize further weight loss.  Next visit: To be scheduled as needed.  **Disclaimer: This note was dictated with voice recognition software. Similar sounding words can inadvertently be transcribed and this note may contain transcription errors which may not have been corrected upon publication of note.**

## 2021-05-05 NOTE — Progress Notes (Signed)
[place] [phone]   [phone]  OFFICE PROGRESS NOTE  [employer], MD [address] [place] [address]  DIAGNOSIS: stage IIIA (T3, N2, M0) non-small cell lung cancer, adenosquamous carcinoma diagnosed in June 2019 and presented with large right upper lobe lung mass with questionable chest wall invasion as well as right hilar and mediastinal lymphadenopathy.  Biomarker Findings Tumor Mutational Burden - TMB-High (21 Muts/Mb) Microsatellite status - MS-Stable Genomic Findings For a complete list of the genes assayed, please refer to the Appendix. ATM V9563* CCND2 P281R KRAS G12V CHEK2 T335fs*15 TP53 E298* 7 Disease relevant genes with no reportable alterations: ALK, EGFR, BRAF, MET, RET, ERBB2, ROS1  PDL1 Expression: 99%.   PRIOR THERAPY:  1) Concurrent chemoradiation with chemotherapy consisting of weekly carboplatin for an AUC of 2 and paclitaxel 45 mg/m2.  First dose given on 04/03/2018.  Status post 6 cycles.  Last dose was giving 05/15/2018 with partial response. 2) Consolidation treatment with immunotherapy with Imfinzi (Durvalumab) 10 mg/KG every 2 weeks.  First dose 06/20/2018.  Status post 26 cycles. 3) Systemic treatment with immunotherapy with Keytruda 200 mg IV every 3 weeks.  First dose February 27, 2020. Status post 12 cycles. Discontinued due to disease progression.   CURRENT THERAPY:  Carboplatin for an AUC of 5 and Alimta 500 mg per metered squared on days 1 IV every 3 weeks.  First dose expected on 03/03/2021. Status post 3 cycles.  His dose of carboplatin was reduced to AUC of 4 and Alimta 400 Mg/M2 secondary to intolerance.  INTERVAL HISTORY: [name] 60 y.o. male returns to the clinic today for follow-up visit accompanied by his wife [name].  The patient is feeling fine today with no concerning complaints except for mild fatigue.  He received 2 units of PRBCs transfusion secondary to chemotherapy-induced anemia.   The patient also had significant neutropenia with the last cycle of his treatment but unfortunately his insurance did not approve any G-CSF agent to help with his significant neutropenia.  He denied having any current chest pain, shortness of breath, cough or hemoptysis.  He denied having any fever or chills.  He has no nausea, vomiting, diarrhea or constipation.  He has no headache or visual changes.  He had repeat CT scan of the chest, abdomen pelvis performed recently and he is here for evaluation and discussion of his scan results.    MEDICAL HISTORY: Past Medical History:  Diagnosis Date   Acute hepatitis C virus infection 06/19/2007   Qualifier: Diagnosis of  By: Leilani Merl CMA, Tiffany     Acute ST elevation myocardial infarction (STEMI) involving left anterior descending (LAD) coronary artery (HCC) 05/26/2016   Acute ST elevation myocardial infarction (STEMI) of lateral wall (HCC) 05/26/2016   AICD (automatic cardioverter/defibrillator) present 03/02/2018   Anxiety    Asthma    Atherosclerosis of native arteries of the extremities with intermittent claudication 12/16/2011   Chronic hepatitis C without hepatic coma ([facility]) 05/03/2014   COPD with chronic bronchitis and emphysema ([facility]) 03/13/2018   FEV1 57%   Depression    DVT (deep venous thrombosis) ([facility]) 2009; 2019   RLE; LLE   Encounter for antineoplastic chemotherapy 03/22/2018   Hepatitis C    "tx'd in 2015"   High cholesterol    Ischemic cardiomyopathy 03/02/2018   Non-small cell carcinoma of right lung, stage 3 ([facility]) 03/22/2018   PAD (peripheral artery disease) ([facility]) 01/25/2013   Peripheral vascular disease, unspecified 07/20/2012  Port-A-Cath in place 04/24/2018   ST elevation myocardial infarction involving left anterior descending (LAD) coronary artery (HCC)    STEMI (ST elevation myocardial infarction) ([facility]) 05/26/2016   Tobacco abuse 01/28/2016   Tubular adenoma of colon 07/11/2014   Urinary dribbling     ALLERGIES:  is allergic to  lyrica [pregabalin] and neurontin [gabapentin].  MEDICATIONS:  Current Outpatient Medications  Medication Sig Dispense Refill   albuterol (VENTOLIN HFA) 108 (90 Base) MCG/ACT inhaler Inhale 1-2 puffs into the lungs every 4 (four) hours as needed for wheezing or shortness of breath.     alprazolam (XANAX) 2 MG tablet Take 2 mg by mouth 4 (four) times daily as needed for anxiety.     amitriptyline (ELAVIL) 100 MG tablet Take 100 mg by mouth at bedtime.      aspirin 81 MG EC tablet Take 81 mg by mouth daily.     citalopram (CELEXA) 40 MG tablet Take 40 mg by mouth daily.  11   dexamethasone (DECADRON) 4 MG tablet Please take 1 tablet twice a day the day before, the day of, and the day after treatment 40 tablet 2   folic acid (FOLVITE) 1 MG tablet Take 1 tablet (1 mg total) by mouth daily. 30 tablet 2   HYDROcodone-acetaminophen (NORCO) 10-325 MG tablet Take 1 tablet by mouth every 4 (four) hours as needed for moderate pain.   0   lacosamide (VIMPAT) 50 MG TABS tablet Take 1 tablet (50 mg total) by mouth 2 (two) times daily. 60 tablet 0   levothyroxine (SYNTHROID) 125 MCG tablet Take 125 mcg by mouth daily.     lidocaine-prilocaine (EMLA) cream Apply 1 application topically as needed. 30 g 0   midodrine (PROAMATINE) 10 MG tablet Take 1 tablet (10 mg total) by mouth 3 (three) times daily with meals. 90 tablet 1   nitroGLYCERIN (NITROSTAT) 0.4 MG SL tablet Place 1 tablet (0.4 mg total) under the tongue every 5 (five) minutes x 3 doses as needed for chest pain. 25 tablet 2   potassium chloride SA (KLOR-CON) 20 MEQ tablet Take 1 tablet (20 mEq total) by mouth daily. 5 tablet 0   prochlorperazine (COMPAZINE) 10 MG tablet Take 1 tablet (10 mg total) by mouth every 6 (six) hours as needed for nausea or vomiting. 30 tablet 2   sucralfate (CARAFATE) 1 g tablet Take 1 tablet (1 g total) by mouth 4 (four) times daily -  with meals and at bedtime. 5 min before meals for radiation induced esophagitis 120 tablet 2    tamsulosin (FLOMAX) 0.4 MG CAPS capsule Take 0.4 mg by mouth daily.     TRELEGY ELLIPTA 100-62.5-25 MCG/INH AEPB Inhale 1 puff into the lungs daily.     No current facility-administered medications for this visit.    SURGICAL HISTORY:  Past Surgical History:  Procedure Laterality Date   AORTOGRAM  08/08/2009   for LLE claudication     By Dr. 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   BIOPSY N/A 05/30/2014   Procedure: BIOPSY;  Surgeon: [name], MD;  Location: [facility];  Service: Endoscopy;  Laterality: N/A;   BLADDER TUMOR EXCISION  8/812   CARDIAC CATHETERIZATION N/A 05/26/2016   Procedure: Left Heart Cath and Coronary Angiography;  Surgeon: [name], MD;  Location: [facility];  Service: Cardiovascular;  Laterality: N/A;   CARDIAC CATHETERIZATION N/A 05/26/2016   Procedure: Coronary Stent Intervention;  Surgeon: [name], MD;  Location:  [facility];  Service: Cardiovascular;  Laterality: N/A;   COLONOSCOPY WITH PROPOFOL N/A 05/30/2014   KZG:FUQXAF colonic polyp-likely source of hematochezia-removed as described above   ESOPHAGOGASTRODUODENOSCOPY (EGD) WITH PROPOFOL N/A 05/30/2014   HSV:EXOGAC and bulbar erosions s/p gastric biopsy. No evidence of portal gastropathy on today's examination.   FEMORAL-TIBIAL BYPASS GRAFT  2009   Right side using non-reversed GSV   By Dr. Izola Price BYPASS GRAFT  11/24/2007   Right femoral to anterior tibial BPG   by Dr. Darrick Penna   FINGER SURGERY Left    "straightened my pinky"   HAND TENDON SURGERY Left 2013   Left 5th finger   HERNIA REPAIR     "stomach"   ICD IMPLANT N/A 03/02/2018   Procedure: ICD IMPLANT;  Surgeon: Regan Lemming, MD;  Location: [facility];  Service: Cardiovascular;  Laterality: N/A;   INCISIONAL HERNIA REPAIR N/A 12/25/2014   Procedure: HERNIA REPAIR INCISIONAL WITH MESH;  Surgeon: Franky Macho Md, MD;  Location: [facility];  Service: General;  Laterality: N/A;    INSERTION OF MESH N/A 12/25/2014   Procedure: INSERTION OF MESH;  Surgeon: Franky Macho Md, MD;  Location: [facility];  Service: General;  Laterality: N/A;   IR IMAGING GUIDED PORT INSERTION  04/11/2018   LAPAROSCOPIC CHOLECYSTECTOMY     LOWER EXTREMITY ANGIOGRAM Left 12/30/2015   Procedure: Lower Extremity Angiogram;  Surgeon: Sherren Kerns, MD;  [facility];  Service: Cardiovascular;  Laterality: Left;   PERIPHERAL VASCULAR CATHETERIZATION N/A 12/30/2015   Procedure: Abdominal Aortogram;  Surgeon: Sherren Kerns, MD;  Location: [facility];  Service: Cardiovascular;  Laterality: N/A;   POLYPECTOMY  05/30/2014   Procedure: POLYPECTOMY;  Surgeon: Corbin Ade, MD;  Location: [facility];  Service: Endoscopy;;   TONSILLECTOMY AND ADENOIDECTOMY  ~ 1970   TYMPANOSTOMY TUBE PLACEMENT Bilateral ~ 1970    REVIEW OF SYSTEMS:  Constitutional: positive for fatigue Eyes: negative Ears, nose, mouth, throat, and face: negative Respiratory: negative Cardiovascular: negative Gastrointestinal: negative Genitourinary:negative Integument/breast: negative Hematologic/lymphatic: negative Musculoskeletal:positive for muscle weakness Neurological: negative Behavioral/Psych: negative Endocrine: negative Allergic/Immunologic: negative   PHYSICAL EXAMINATION: General appearance: alert, cooperative, fatigued, and no distress Head: Normocephalic, without obvious abnormality, atraumatic Neck: no adenopathy, no JVD, supple, symmetrical, trachea midline, and thyroid not enlarged, symmetric, no tenderness/mass/nodules Lymph nodes: Cervical, supraclavicular, and axillary nodes normal. Resp: clear to auscultation bilaterally Back: symmetric, no curvature. ROM normal. No CVA tenderness. Cardio: regular rate and rhythm, S1, S2 normal, no murmur, click, rub or gallop GI: soft, non-tender; bowel sounds normal; no masses,  no organomegaly Extremities: Right below knee amputation otherwise no  edema. Neurologic: Alert and oriented X 3, normal strength and tone. Normal symmetric reflexes. Normal coordination and gait  ECOG PERFORMANCE STATUS: 1 - Symptomatic but completely ambulatory  Blood pressure 107/78, pulse 100, temperature 98.4 F (36.9 C), temperature source Oral, resp. rate 18, weight 182 lb 6.4 oz (82.7 kg), SpO2 97 %.  LABORATORY DATA: Lab Results  Component Value Date   WBC 1.1 (L) 04/28/2021   HGB 6.5 (LL) 04/28/2021   HCT 20.2 (L) 04/28/2021   MCV 83.1 04/28/2021   PLT 50 (L) 04/28/2021      Chemistry      Component Value Date/Time   NA 136 04/28/2021 1113   NA 139 02/27/2018 1457   K 3.6 04/28/2021 1113   CL 94 (L) 04/28/2021 1113   CO2 32 04/28/2021 1113  BUN 6 04/28/2021 1113   BUN 7 02/27/2018 1457   CREATININE 0.80 04/28/2021 1113   CREATININE 1.13 06/28/2016 1453      Component Value Date/Time   CALCIUM 8.3 (L) 04/28/2021 1113   ALKPHOS 139 (H) 04/28/2021 1113   AST 75 (H) 04/28/2021 1113   ALT 129 (H) 04/28/2021 1113   BILITOT 0.3 04/28/2021 1113       RADIOGRAPHIC STUDIES: CT Chest W Contrast  Result Date: 05/04/2021 CLINICAL DATA:  Non-small cell lung cancer staging. Chemotherapy complete 05/15/2018. Radiation therapy in 2019. Ongoing chemotherapy. Reduced appetite and weight loss. EXAM: CT CHEST, ABDOMEN, AND PELVIS WITH CONTRAST TECHNIQUE: Multidetector CT imaging of the chest, abdomen and pelvis was performed following the standard protocol during bolus administration of intravenous contrast. CONTRAST:  90mL OMNIPAQUE IOHEXOL 350 MG/ML SOLN COMPARISON:  Multiple exams, including 02/20/2021 and PET-CT from 02/08/2020 FINDINGS: CT CHEST FINDINGS Cardiovascular: Right Port-A-Cath tip: Cavoatrial junction. AICD noted. Coronary, aortic arch, and branch vessel atherosclerotic vascular disease. Small but stable pericardial effusion. Mediastinum/Nodes: Mild distal esophageal wall thickening, esophagitis would be a common cause. No pathologic  adenopathy identified. Lungs/Pleura: Extensive consolidation at the right lung apex and throughout much of the right upper lobe with associated volume loss. Cavitary process anteriorly along the right upper lobe similar to previous. Stable 3 mm right lower lobe nodule on image 98 series 6. Emphysema. Stable 0.7 cm ground-glass density left upper lobe nodule on image 46 series 6. Stable 0.4 cm in thickness subpleural nodule along the left lower lobe side of the major fissure on image 74 series 6. Stable 3 mm left upper lobe nodule on image 79 series 6. Stable calcified pleural based nodule along the left lower lobe on image 97 series 6. Airway thickening in the right middle lobe and right lower lobe along the hilum. Musculoskeletal: Lower thoracic spondylosis. Stable mild periostitis anteriorly along the right third and fourth ribs, possibly therapy related. CT ABDOMEN PELVIS FINDINGS Hepatobiliary: Cholecystectomy.  Otherwise unremarkable. Pancreas: Unremarkable Spleen: Upper normal splenic size. Adrenals/Urinary Tract: 5 mm hypodense right mid kidney lesion on image 100 series 4. This is likely a cyst although technically too small to characterize. Adrenal glands unremarkable. Stomach/Bowel: Unremarkable Vascular/Lymphatic: Aortoiliac atherosclerotic vascular disease. No pathologic adenopathy identified. Left external iliac artery stent. Non patent right external iliac artery. Reproductive: Small size of the prostate gland. Other: Mild nonspecific presacral stranding. Musculoskeletal: Unremarkable IMPRESSION: 1. Stable appearance of the right upper lobe with volume loss which is likely therapy related, and also in anterior cavitary lesion which could be from therapy related necrosis, tumor necrosis, or chronic fungal infection. This is not appreciably changed from 02/20/2021. 2. Airway thickening centrally in the right middle lobe and right lower lobe. 3. Several stable small pulmonary nodules as noted above,  continued surveillance suggested. 4. Stable small pericardial effusion. 5. Other imaging findings of potential clinical significance: Aortic Atherosclerosis (ICD10-I70.0) and Emphysema (ICD10-J43.9). Stable mild periostitis anteriorly along the right third and fourth ribs, likely therapy related. Electronically Signed   By: Van Clines M.D.   On: 05/04/2021 08:24   CT Abdomen Pelvis W Contrast  Result Date: 05/04/2021 CLINICAL DATA:  Non-small cell lung cancer staging. Chemotherapy complete 05/15/2018. Radiation therapy in 2019. Ongoing chemotherapy. Reduced appetite and weight loss. EXAM: CT CHEST, ABDOMEN, AND PELVIS WITH CONTRAST TECHNIQUE: Multidetector CT imaging of the chest, abdomen and pelvis was performed following the standard protocol during bolus administration of intravenous contrast. CONTRAST:  1mL OMNIPAQUE IOHEXOL 350 MG/ML SOLN COMPARISON:  Multiple exams, including 02/20/2021 and PET-CT from 02/08/2020 FINDINGS: CT CHEST FINDINGS Cardiovascular: Right Port-A-Cath tip: Cavoatrial junction. AICD noted. Coronary, aortic arch, and branch vessel atherosclerotic vascular disease. Small but stable pericardial effusion. Mediastinum/Nodes: Mild distal esophageal wall thickening, esophagitis would be a common cause. No pathologic adenopathy identified. Lungs/Pleura: Extensive consolidation at the right lung apex and throughout much of the right upper lobe with associated volume loss. Cavitary process anteriorly along the right upper lobe similar to previous. Stable 3 mm right lower lobe nodule on image 98 series 6. Emphysema. Stable 0.7 cm ground-glass density left upper lobe nodule on image 46 series 6. Stable 0.4 cm in thickness subpleural nodule along the left lower lobe side of the major fissure on image 74 series 6. Stable 3 mm left upper lobe nodule on image 79 series 6. Stable calcified pleural based nodule along the left lower lobe on image 97 series 6. Airway thickening in the right  middle lobe and right lower lobe along the hilum. Musculoskeletal: Lower thoracic spondylosis. Stable mild periostitis anteriorly along the right third and fourth ribs, possibly therapy related. CT ABDOMEN PELVIS FINDINGS Hepatobiliary: Cholecystectomy.  Otherwise unremarkable. Pancreas: Unremarkable Spleen: Upper normal splenic size. Adrenals/Urinary Tract: 5 mm hypodense right mid kidney lesion on image 100 series 4. This is likely a cyst although technically too small to characterize. Adrenal glands unremarkable. Stomach/Bowel: Unremarkable Vascular/Lymphatic: Aortoiliac atherosclerotic vascular disease. No pathologic adenopathy identified. Left external iliac artery stent. Non patent right external iliac artery. Reproductive: Small size of the prostate gland. Other: Mild nonspecific presacral stranding. Musculoskeletal: Unremarkable IMPRESSION: 1. Stable appearance of the right upper lobe with volume loss which is likely therapy related, and also in anterior cavitary lesion which could be from therapy related necrosis, tumor necrosis, or chronic fungal infection. This is not appreciably changed from 02/20/2021. 2. Airway thickening centrally in the right middle lobe and right lower lobe. 3. Several stable small pulmonary nodules as noted above, continued surveillance suggested. 4. Stable small pericardial effusion. 5. Other imaging findings of potential clinical significance: Aortic Atherosclerosis (ICD10-I70.0) and Emphysema (ICD10-J43.9). Stable mild periostitis anteriorly along the right third and fourth ribs, likely therapy related. Electronically Signed   By: Van Clines M.D.   On: 05/04/2021 08:24    ASSESSMENT AND PLAN: This is a very pleasant 60 years old white male recently diagnosed with a stage IIIa non-small cell lung cancer, adenosquamous carcinoma.  He underwent a course of concurrent chemoradiation with weekly carboplatin and paclitaxel status post 6 cycles with partial response.  He has  no actionable mutation but PD-L1 expression is 99%. The patient completed treatment with consolidation immunotherapy with Imfinzi status post 26 cycles.  He tolerated this treatment well with no concerning adverse effects. The patient was on observation for several months but he developed disease progression. He underwent treatment with systemic immunotherapy with Keytruda 200 mg IV every 3 weeks status post 12 cycles.  He missed a few cycles of his treatment because of traveling to [place] to take care of his parents.   He tolerated this treatment well but unfortunately had evidence for disease progression. He started systemic chemotherapy with carboplatin for AUC of 5 and Alimta 500 Mg/M2 status post 3 cycles.  The patient has been tolerating his systemic chemotherapy fairly well except for the last episode after cycle #2 when he had cardiac arrest of unclear etiology and he survived that event and discharged from the hospital few days later. The patient tolerated cycle #3 fairly well except  for the chemotherapy-induced pancytopenia requiring PRBCs transfusion as well as G-CSF. He had repeat CT scan of the chest, abdomen pelvis performed recently.  I personally and independently reviewed the scan images and discussed the results with the patient and his wife. His scan showed no concerning findings for disease progression. I recommended for him to proceed with cycle #4 today but I will reduce the dose of carboplatin to AUC of 4 and Alimta to 400 Mg/M2 secondary to intolerance and pancytopenia. For the hypothyroidism, the patient will continue on levothyroxine and will continue to monitor his TSH on as-needed basis. The patient will come back for follow-up visit in 3 weeks for evaluation before starting cycle #5. He was advised to call immediately if he has any concerning symptoms in the interval. CODE STATUS, full code based on his request The patient voices understanding of current disease status and  treatment options and is in agreement with the current care plan. All questions were answered. The patient knows to call the clinic with any problems, questions or concerns. We can certainly see the patient much sooner if necessary.   Disclaimer: This note was dictated with voice recognition software. Similar sounding words can inadvertently be transcribed and may not be corrected upon review.

## 2021-05-06 DIAGNOSIS — Z6824 Body mass index (BMI) 24.0-24.9, adult: Secondary | ICD-10-CM | POA: Diagnosis not present

## 2021-05-06 DIAGNOSIS — C3490 Malignant neoplasm of unspecified part of unspecified bronchus or lung: Secondary | ICD-10-CM | POA: Diagnosis not present

## 2021-05-06 DIAGNOSIS — G546 Phantom limb syndrome with pain: Secondary | ICD-10-CM | POA: Diagnosis not present

## 2021-05-06 DIAGNOSIS — G894 Chronic pain syndrome: Secondary | ICD-10-CM | POA: Diagnosis not present

## 2021-05-06 DIAGNOSIS — Z89511 Acquired absence of right leg below knee: Secondary | ICD-10-CM | POA: Diagnosis not present

## 2021-05-11 ENCOUNTER — Inpatient Hospital Stay: Payer: Medicare Other

## 2021-05-11 ENCOUNTER — Other Ambulatory Visit: Payer: Self-pay

## 2021-05-11 DIAGNOSIS — Z95828 Presence of other vascular implants and grafts: Secondary | ICD-10-CM

## 2021-05-11 DIAGNOSIS — D6481 Anemia due to antineoplastic chemotherapy: Secondary | ICD-10-CM

## 2021-05-11 DIAGNOSIS — Z86718 Personal history of other venous thrombosis and embolism: Secondary | ICD-10-CM | POA: Diagnosis not present

## 2021-05-11 DIAGNOSIS — C3411 Malignant neoplasm of upper lobe, right bronchus or lung: Secondary | ICD-10-CM | POA: Diagnosis not present

## 2021-05-11 DIAGNOSIS — Z79899 Other long term (current) drug therapy: Secondary | ICD-10-CM | POA: Diagnosis not present

## 2021-05-11 DIAGNOSIS — Z5111 Encounter for antineoplastic chemotherapy: Secondary | ICD-10-CM | POA: Diagnosis not present

## 2021-05-11 DIAGNOSIS — E039 Hypothyroidism, unspecified: Secondary | ICD-10-CM | POA: Diagnosis not present

## 2021-05-11 DIAGNOSIS — Z7982 Long term (current) use of aspirin: Secondary | ICD-10-CM | POA: Diagnosis not present

## 2021-05-11 DIAGNOSIS — C3490 Malignant neoplasm of unspecified part of unspecified bronchus or lung: Secondary | ICD-10-CM

## 2021-05-11 DIAGNOSIS — D61810 Antineoplastic chemotherapy induced pancytopenia: Secondary | ICD-10-CM | POA: Diagnosis not present

## 2021-05-11 DIAGNOSIS — T451X5A Adverse effect of antineoplastic and immunosuppressive drugs, initial encounter: Secondary | ICD-10-CM | POA: Diagnosis not present

## 2021-05-11 DIAGNOSIS — C3491 Malignant neoplasm of unspecified part of right bronchus or lung: Secondary | ICD-10-CM

## 2021-05-11 LAB — CMP (CANCER CENTER ONLY)
ALT: 38 U/L (ref 0–44)
AST: 35 U/L (ref 15–41)
Albumin: 3.3 g/dL — ABNORMAL LOW (ref 3.5–5.0)
Alkaline Phosphatase: 116 U/L (ref 38–126)
Anion gap: 13 (ref 5–15)
BUN: 11 mg/dL (ref 6–20)
CO2: 32 mmol/L (ref 22–32)
Calcium: 8.9 mg/dL (ref 8.9–10.3)
Chloride: 92 mmol/L — ABNORMAL LOW (ref 98–111)
Creatinine: 0.84 mg/dL (ref 0.61–1.24)
GFR, Estimated: >60 mL/min (ref >60)
Glucose, Bld: 101 mg/dL — ABNORMAL HIGH (ref 70–99)
Potassium: 3.4 mmol/L — ABNORMAL LOW (ref 3.5–5.1)
Sodium: 137 mmol/L (ref 135–145)
Total Bilirubin: 0.5 mg/dL (ref 0.3–1.2)
Total Protein: 6.7 g/dL (ref 6.5–8.1)

## 2021-05-11 LAB — SAMPLE TO BLOOD BANK

## 2021-05-11 LAB — CBC WITH DIFFERENTIAL (CANCER CENTER ONLY)
Abs Immature Granulocytes: 0.03 10*3/uL (ref 0.00–0.07)
Basophils Absolute: 0 10*3/uL (ref 0.0–0.1)
Basophils Relative: 0 %
Eosinophils Absolute: 0 10*3/uL (ref 0.0–0.5)
Eosinophils Relative: 0 %
HCT: 29.4 % — ABNORMAL LOW (ref 39.0–52.0)
Hemoglobin: 9.7 g/dL — ABNORMAL LOW (ref 13.0–17.0)
Immature Granulocytes: 1 %
Lymphocytes Relative: 9 %
Lymphs Abs: 0.5 10*3/uL — ABNORMAL LOW (ref 0.7–4.0)
MCH: 28.1 pg (ref 26.0–34.0)
MCHC: 33 g/dL (ref 30.0–36.0)
MCV: 85.2 fL (ref 80.0–100.0)
Monocytes Absolute: 0.1 10*3/uL (ref 0.1–1.0)
Monocytes Relative: 2 %
Neutro Abs: 4.5 10*3/uL (ref 1.7–7.7)
Neutrophils Relative %: 88 %
Platelet Count: 316 10*3/uL (ref 150–400)
RBC: 3.45 MIL/uL — ABNORMAL LOW (ref 4.22–5.81)
RDW: 21.2 % — ABNORMAL HIGH (ref 11.5–15.5)
WBC Count: 5.1 10*3/uL (ref 4.0–10.5)
nRBC: 0 % (ref 0.0–0.2)

## 2021-05-11 MED ORDER — SODIUM CHLORIDE 0.9% FLUSH
10.0000 mL | Freq: Once | INTRAVENOUS | Status: AC
  Administered 2021-05-11: 10 mL

## 2021-05-11 MED ORDER — HEPARIN SOD (PORK) LOCK FLUSH 100 UNIT/ML IV SOLN
500.0000 [IU] | Freq: Once | INTRAVENOUS | Status: AC
  Administered 2021-05-11: 500 [IU]

## 2021-05-18 ENCOUNTER — Other Ambulatory Visit: Payer: Self-pay

## 2021-05-18 ENCOUNTER — Other Ambulatory Visit: Payer: Self-pay | Admitting: Physician Assistant

## 2021-05-18 ENCOUNTER — Inpatient Hospital Stay: Payer: Medicare Other

## 2021-05-18 DIAGNOSIS — D649 Anemia, unspecified: Secondary | ICD-10-CM

## 2021-05-18 DIAGNOSIS — Z5111 Encounter for antineoplastic chemotherapy: Secondary | ICD-10-CM | POA: Diagnosis not present

## 2021-05-18 DIAGNOSIS — E039 Hypothyroidism, unspecified: Secondary | ICD-10-CM | POA: Diagnosis not present

## 2021-05-18 DIAGNOSIS — T451X5A Adverse effect of antineoplastic and immunosuppressive drugs, initial encounter: Secondary | ICD-10-CM | POA: Diagnosis not present

## 2021-05-18 DIAGNOSIS — C3411 Malignant neoplasm of upper lobe, right bronchus or lung: Secondary | ICD-10-CM | POA: Diagnosis not present

## 2021-05-18 DIAGNOSIS — Z79899 Other long term (current) drug therapy: Secondary | ICD-10-CM | POA: Diagnosis not present

## 2021-05-18 DIAGNOSIS — C3491 Malignant neoplasm of unspecified part of right bronchus or lung: Secondary | ICD-10-CM

## 2021-05-18 DIAGNOSIS — C3490 Malignant neoplasm of unspecified part of unspecified bronchus or lung: Secondary | ICD-10-CM

## 2021-05-18 DIAGNOSIS — Z7982 Long term (current) use of aspirin: Secondary | ICD-10-CM | POA: Diagnosis not present

## 2021-05-18 DIAGNOSIS — D61810 Antineoplastic chemotherapy induced pancytopenia: Secondary | ICD-10-CM | POA: Diagnosis not present

## 2021-05-18 DIAGNOSIS — Z95828 Presence of other vascular implants and grafts: Secondary | ICD-10-CM

## 2021-05-18 DIAGNOSIS — Z86718 Personal history of other venous thrombosis and embolism: Secondary | ICD-10-CM | POA: Diagnosis not present

## 2021-05-18 LAB — CBC WITH DIFFERENTIAL (CANCER CENTER ONLY)
Abs Immature Granulocytes: 0.01 10*3/uL (ref 0.00–0.07)
Basophils Absolute: 0 10*3/uL (ref 0.0–0.1)
Basophils Relative: 0 %
Eosinophils Absolute: 0.1 10*3/uL (ref 0.0–0.5)
Eosinophils Relative: 2 %
HCT: 25.3 % — ABNORMAL LOW (ref 39.0–52.0)
Hemoglobin: 8.4 g/dL — ABNORMAL LOW (ref 13.0–17.0)
Immature Granulocytes: 0 %
Lymphocytes Relative: 23 %
Lymphs Abs: 0.6 10*3/uL — ABNORMAL LOW (ref 0.7–4.0)
MCH: 28.3 pg (ref 26.0–34.0)
MCHC: 33.2 g/dL (ref 30.0–36.0)
MCV: 85.2 fL (ref 80.0–100.0)
Monocytes Absolute: 0.3 10*3/uL (ref 0.1–1.0)
Monocytes Relative: 11 %
Neutro Abs: 1.6 10*3/uL — ABNORMAL LOW (ref 1.7–7.7)
Neutrophils Relative %: 64 %
Platelet Count: 57 10*3/uL — ABNORMAL LOW (ref 150–400)
RBC: 2.97 MIL/uL — ABNORMAL LOW (ref 4.22–5.81)
RDW: 19.9 % — ABNORMAL HIGH (ref 11.5–15.5)
WBC Count: 2.6 10*3/uL — ABNORMAL LOW (ref 4.0–10.5)
nRBC: 0 % (ref 0.0–0.2)

## 2021-05-18 LAB — CMP (CANCER CENTER ONLY)
ALT: 71 U/L — ABNORMAL HIGH (ref 0–44)
AST: 61 U/L — ABNORMAL HIGH (ref 15–41)
Albumin: 3.2 g/dL — ABNORMAL LOW (ref 3.5–5.0)
Alkaline Phosphatase: 141 U/L — ABNORMAL HIGH (ref 38–126)
Anion gap: 13 (ref 5–15)
BUN: 6 mg/dL (ref 6–20)
CO2: 30 mmol/L (ref 22–32)
Calcium: 8.1 mg/dL — ABNORMAL LOW (ref 8.9–10.3)
Chloride: 92 mmol/L — ABNORMAL LOW (ref 98–111)
Creatinine: 0.84 mg/dL (ref 0.61–1.24)
GFR, Estimated: >60 mL/min (ref >60)
Glucose, Bld: 105 mg/dL — ABNORMAL HIGH (ref 70–99)
Potassium: 3.2 mmol/L — ABNORMAL LOW (ref 3.5–5.1)
Sodium: 135 mmol/L (ref 135–145)
Total Bilirubin: 0.5 mg/dL (ref 0.3–1.2)
Total Protein: 6.7 g/dL (ref 6.5–8.1)

## 2021-05-18 MED ORDER — SODIUM CHLORIDE 0.9% FLUSH
10.0000 mL | Freq: Once | INTRAVENOUS | Status: AC
  Administered 2021-05-18: 10 mL

## 2021-05-18 MED ORDER — HEPARIN SOD (PORK) LOCK FLUSH 100 UNIT/ML IV SOLN
500.0000 [IU] | Freq: Once | INTRAVENOUS | Status: AC
  Administered 2021-05-18: 500 [IU]

## 2021-05-23 NOTE — Progress Notes (Signed)
[facility] OFF[facility] PROGRESS NOTE  Redmond School, MD [address] [place] [address]  DIAGNOSIS: Recurrent lung cancer initially diagnosed as stage IIIA (T3, N2, M0) non-small cell lung cancer, adenosquamous carcinoma diagnosed in June 2019 and presented with large right upper lobe lung mass with questionable chest wall invasion as well as right hilar and mediastinal lymphadenopathy.   Biomarker Findings Tumor Mutational Burden - TMB-High (21 Muts/Mb) Microsatellite status - MS-Stable Genomic Findings For a complete list of the genes assayed, please refer to the Appendix. ATM U7253* CCND2 P281R KRAS G12V CHEK2 T320fs*15 TP53 E298* 7 Disease relevant genes with no reportable alterations: ALK, EGFR, BRAF, MET, RET, ERBB2, ROS1   PDL1 Expression: 99%  PRIOR THERAPY:  1) Concurrent chemoradiation with chemotherapy consisting of weekly carboplatin for an AUC of 2 and paclitaxel 45 mg/m2.  First dose given on 04/03/2018.  Status post 6 cycles.  Last dose was giving 05/15/2018 with partial response. 2) Consolidation treatment with immunotherapy with Imfinzi (Durvalumab) 10 mg/KG every 2 weeks.  First dose 06/20/2018.  Status post 26 cycles. 3) Systemic treatment with immunotherapy with Keytruda 200 mg IV every 3 weeks.  First dose February 27, 2020. Status post 12 cycles. Discontinued due to disease progression.  CURRENT THERAPY: Carboplatin for an AUC of 5 and Alimta 500 mg per metered squared on days 1 IV every 3 weeks.  First dose expected on 03/03/2021. Status post 4 cycle. His dose of carboplatin was reduced to an AUC of 4 and Alimta reduced to 400 mg/m2 due to intolerance. Dr. Julien Nordmann would like to do 6 cycles of carboplatin and Alimta. Starting from cycle #7, we will start him on maintenance Alimta 400 mg/m2.   INTERVAL HISTORY: [name] 60 y.o. male returns to the clinic today for a follow-up visit.  The patient has been doing fair today without any concerning  complaints. The patient is undergoing chemotherapy. His dose of chemotherapy was reduced due to intolerance.  He has neutropenia and anemia with treatment which sometimes requires blood transfusions. He denies abnormal bleeding (nose bleeding, gum bleeding, hematochezia, or hematuria) except he has easy bruising and bleeding from scabs on his forearms.   Patient denies any recent fevers, chills, or night sweats today.  He saw a member of the nutritionist team at his last appointment due to his weight loss.  He denies recent nausea or vomiting. He denies any diarrhea. She takes OTC medication for constipation but is not sure which one. He denies any chest pain or hemoptysis.  He reports his baseline shortness of breath with exertion and chronic cough.  Continues to smoke cigarettes. He denies any headache or visual changes.  The patient has some memory deficits at baseline.  The patient is here today for evaluation and repeat blood work before starting cycle #5 which will be his first cycle of maintenance Alimta and Keytruda.     MEDICAL HISTORY: Past Medical History:  Diagnosis Date   Acute hepatitis C virus infection 06/19/2007   Qualifier: Diagnosis of  By: Leilani Merl CMA, Tiffany     Acute ST elevation myocardial infarction (STEMI) involving left anterior descending (LAD) coronary artery (HCC) 05/26/2016   Acute ST elevation myocardial infarction (STEMI) of lateral wall (HCC) 05/26/2016   AICD (automatic cardioverter/defibrillator) present 03/02/2018   Anxiety    Asthma    Atherosclerosis of native arteries of the extremities with intermittent claudication 12/16/2011   Chronic hepatitis C without hepatic coma ([place]) 05/03/2014   COPD with chronic  bronchitis and emphysema ([facility]) 03/13/2018   FEV1 57%   Depression    DVT (deep venous thrombosis) ([facility]) 2009; 2019   RLE; LLE   Encounter for antineoplastic chemotherapy 03/22/2018   Hepatitis C    "tx'd in 2015"   High cholesterol    Ischemic  cardiomyopathy 03/02/2018   Non-small cell carcinoma of right lung, stage 3 ([facility]) 03/22/2018   PAD (peripheral artery disease) ([facility]) 01/25/2013   Peripheral vascular disease, unspecified 07/20/2012   Port-A-Cath in place 04/24/2018   ST elevation myocardial infarction involving left anterior descending (LAD) coronary artery (HCC)    STEMI (ST elevation myocardial infarction) ([place]) 05/26/2016   Tobacco abuse 01/28/2016   Tubular adenoma of colon 07/11/2014   Urinary dribbling     ALLERGIES:  is allergic to lyrica [pregabalin] and neurontin [gabapentin].  MEDICATIONS:  Current Outpatient Medications  Medication Sig Dispense Refill   albuterol (VENTOLIN HFA) 108 (90 Base) MCG/ACT inhaler Inhale 1-2 puffs into the lungs every 4 (four) hours as needed for wheezing or shortness of breath.     alprazolam (XANAX) 2 MG tablet Take 2 mg by mouth 4 (four) times daily as needed for anxiety.     amitriptyline (ELAVIL) 100 MG tablet Take 100 mg by mouth at bedtime.      aspirin 81 MG EC tablet Take 81 mg by mouth daily.     citalopram (CELEXA) 40 MG tablet Take 40 mg by mouth daily.  11   dexamethasone (DECADRON) 4 MG tablet Please take 1 tablet twice a day the day before, the day of, and the day after treatment 40 tablet 2   folic acid (FOLVITE) 1 MG tablet Take 1 tablet (1 mg total) by mouth daily. 30 tablet 2   HYDROcodone-acetaminophen (NORCO) 10-325 MG tablet Take 1 tablet by mouth every 4 (four) hours as needed for moderate pain.   0   levothyroxine (SYNTHROID) 125 MCG tablet Take 125 mcg by mouth daily.     lidocaine-prilocaine (EMLA) cream Apply 1 application topically as needed. 30 g 0   midodrine (PROAMATINE) 10 MG tablet Take 1 tablet (10 mg total) by mouth 3 (three) times daily with meals. 90 tablet 1   nitroGLYCERIN (NITROSTAT) 0.4 MG SL tablet Place 1 tablet (0.4 mg total) under the tongue every 5 (five) minutes x 3 doses as needed for chest pain. 25 tablet 2   potassium chloride SA (KLOR-CON) 20  MEQ tablet Take 1 tablet (20 mEq total) by mouth daily. 5 tablet 0   prochlorperazine (COMPAZINE) 10 MG tablet Take 1 tablet (10 mg total) by mouth every 6 (six) hours as needed for nausea or vomiting. 30 tablet 2   sucralfate (CARAFATE) 1 g tablet Take 1 tablet (1 g total) by mouth 4 (four) times daily -  with meals and at bedtime. 5 min before meals for radiation induced esophagitis 120 tablet 2   tamsulosin (FLOMAX) 0.4 MG CAPS capsule Take 0.4 mg by mouth daily.     TRELEGY ELLIPTA 100-62.5-25 MCG/INH AEPB Inhale 1 puff into the lungs daily.     No current facility-administered medications for this visit.   Facility-Administered Medications Ordered in Other Visits  Medication Dose Route Frequency Provider Last Rate Last Admin   CARBOplatin (PARAPLATIN) 600 mg in sodium chloride 0.9 % 250 mL chemo infusion  600 mg Intravenous Once Curt Bears, MD       dexamethasone (DECADRON) 10 mg in sodium chloride 0.9 % 50 mL IVPB  10 mg Intravenous Once  Curt Bears, MD       diphenhydrAMINE (BENADRYL) capsule 25 mg  25 mg Oral Once Curt Bears, MD       famotidine (PEPCID) IVPB 20 mg in NS 100 mL IVPB  20 mg Intravenous Once Curt Bears, MD       fosaprepitant (EMEND) 150 mg in sodium chloride 0.9 % 145 mL IVPB  150 mg Intravenous Once Curt Bears, MD       heparin lock flush 100 unit/mL  500 Units Intracatheter Once PRN Curt Bears, MD       loratadine (CLARITIN) tablet 10 mg  10 mg Oral Once Curt Bears, MD       palonosetron (ALOXI) injection 0.25 mg  0.25 mg Intravenous Once Curt Bears, MD       PEMEtrexed (ALIMTA) 850 mg in sodium chloride 0.9 % 100 mL chemo infusion  400 mg/m2 (Treatment Plan Recorded) Intravenous Once Curt Bears, MD       sodium chloride flush (NS) 0.9 % injection 10 mL  10 mL Intracatheter PRN Curt Bears, MD        SURGICAL HISTORY:  Past Surgical History:  Procedure Laterality Date   AORTOGRAM  08/08/2009   for LLE  claudication     By Dr. 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   BIOPSY N/A 05/30/2014   Procedure: BIOPSY;  Surgeon: [name], MD;  Location: [facility];  Service: Endoscopy;  Laterality: N/A;   BLADDER TUMOR EXCISION  8/812   CARDIAC CATHETERIZATION N/A 05/26/2016   Procedure: Left Heart Cath and Coronary Angiography;  Surgeon: [name], MD;  Location: [facility];  Service: Cardiovascular;  Laterality: N/A;   CARDIAC CATHETERIZATION N/A 05/26/2016   Procedure: Coronary Stent Intervention;  Surgeon: [name], MD;  Location: [facility];  Service: Cardiovascular;  Laterality: N/A;   COLONOSCOPY WITH PROPOFOL N/A 05/30/2014   MIW:OEHOZY colonic polyp-likely source of hematochezia-removed as described above   ESOPHAGOGASTRODUODENOSCOPY (EGD) WITH PROPOFOL N/A 05/30/2014   YQM:GNOIBB and bulbar erosions s/p gastric biopsy. No evidence of portal gastropathy on today's examination.   FEMORAL-TIBIAL BYPASS GRAFT  2009   Right side using non-reversed GSV   By Dr. Anson [place] BYPASS GRAFT  11/24/2007   Right femoral to anterior tibial BPG   by Dr. Oneida Alar   FINGER SURGERY Left    "straightened my pinky"   HAND TENDON SURGERY Left 2013   Left 5th finger   HERNIA REPAIR     "stomach"   ICD IMPLANT N/A 03/02/2018   Procedure: ICD IMPLANT;  Surgeon: [name], MD;  Location: [facility];  Service: Cardiovascular;  Laterality: N/A;   INCISIONAL HERNIA REPAIR N/A 12/25/2014   Procedure: HERNIA REPAIR INCISIONAL WITH MESH;  Surgeon: [name], MD;  Location: [facility];  Service: General;  Laterality: N/A;   INSERTION OF MESH N/A 12/25/2014   Procedure: INSERTION OF MESH;  Surgeon: [name], MD;  Location: [facility];  Service: General;  Laterality: N/A;   IR IMAGING GUIDED PORT INSERTION  04/11/2018   LAPAROSCOPIC CHOLECYSTECTOMY     LOWER EXTREMITY ANGIOGRAM Left 12/30/2015   Procedure: Lower Extremity Angiogram;  Surgeon: [name], MD;  Location: [facility];  Service: Cardiovascular;  Laterality: Left;   PERIPHERAL VASCULAR CATHETERIZATION N/A 12/30/2015   Procedure: Abdominal Aortogram;  Surgeon: [name], MD;  Location: [facility];  Service: Cardiovascular;  Laterality:  N/A;   POLYPECTOMY  05/30/2014   Procedure: POLYPECTOMY;  Surgeon: [name], MD;  Location: [facility];  Service: Endoscopy;;   TONSILLECTOMY AND ADENOIDECTOMY  ~ 1970   TYMPANOSTOMY TUBE PLACEMENT Bilateral ~ 1970    REVIEW OF SYSTEMS:   Review of Systems  Constitutional: Positive for fatigue and decreased appetite. Negative for chills, fever and unexpected weight change.  HENT:   Negative for mouth sores, nosebleeds, sore throat and trouble swallowing.   Eyes: Negative for eye problems and icterus.  Respiratory: Positive for baseline cough and shortness of breath with exertion. Negative for hemoptysis.  Cardiovascular: Negative for chest pain and leg swelling.  Gastrointestinal: Negative for abdominal pain, constipation, diarrhea, nausea and vomiting.  Genitourinary: Negative for bladder incontinence, difficulty urinating, dysuria, frequency and hematuria.   Musculoskeletal: Negative for back pain, gait problem, neck pain and neck stiffness.  Skin: Negative for itching and rash. Positive for bruising on upper extremity bilaterally.  Neurological: Negative for dizziness, extremity weakness, gait problem, headaches, light-headedness and seizures.  Hematological: Positive for easy skin bruising. Negative for adenopathy.  Psychiatric/Behavioral: Positive for memory deficits at baseline. Negative for confusion, depression and sleep disturbance. The patient is not nervous/anxious.     PHYSICAL EXAMINATION:  Blood pressure 100/78, pulse 93, temperature (!) 97.4 F (36.3 C), temperature source Tympanic, resp. rate 19, height 6' (1.829 m), weight 187 lb 12.8 oz (85.2 kg), SpO2 100 %.  ECOG PERFORMANCE STATUS: 2-4  Physical  Exam  Constitutional: Oriented to person, place, and time and chronically ill-appearing male and in no distress. HENT: Head: Normocephalic and atraumatic. Mouth/Throat: Oropharynx is clear and moist. No oropharyngeal exudate. Eyes: Conjunctivae are normal. Right eye exhibits no discharge. Left eye exhibits no discharge. No scleral icterus. Neck: Normal range of motion. Neck supple. Cardiovascular: Normal rate, regular rhythm, normal heart sounds and intact distal pulses.   Pulmonary/Chest:  Effort normal and clear to ascultation bilaterally. No respiratory distress. No rales. Abdominal: Soft. Bowel sounds are normal. Exhibits no distension and no mass. There is no tenderness.  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 Normal range of motion. Exhibits no edema.  Lymphadenopathy:    No cervical adenopathy.  Neurological: Alert and oriented to person, place, and time. Exhibits normal muscle tone. Examined in the wheelchair. BKA on right leg.  Skin: Bruising on bilateral upper extremities with some bleeding.  Skin is warm and dry. No rash noted. Not diaphoretic. No erythema. No pallor.  Psychiatric: Mood  judgment normal.  Positive for memory deficits. Vitals reviewed.  LABORATORY DATA: Lab Results  Component Value Date   WBC 4.4 05/26/2021   HGB 7.9 (L) 05/26/2021   HCT 24.6 (L) 05/26/2021   MCV 89.1 05/26/2021   PLT 226 05/26/2021      Chemistry      Component Value Date/Time   NA 136 05/26/2021 1111   NA 139 02/27/2018 1457   K 3.3 (L) 05/26/2021 1111   CL 97 (L) 05/26/2021 1111   CO2 30 05/26/2021 1111   BUN 11 05/26/2021 1111   BUN 7 02/27/2018 1457   CREATININE 0.93 05/26/2021 1111   CREATININE 1.13 06/28/2016 1453      Component Value Date/Time   CALCIUM 8.5 (L) 05/26/2021 1111   ALKPHOS 123 05/26/2021 1111   AST 23 05/26/2021 1111   ALT 22 05/26/2021 1111   BILITOT 0.3 05/26/2021 1111       RADIOGRAPHIC STUDIES:  CT Chest W Contrast  Result Date:  05/04/2021 CLINICAL DATA:  Non-small cell lung cancer staging. Chemotherapy complete 05/15/2018. Radiation therapy in 2019. Ongoing chemotherapy. Reduced appetite and weight loss. EXAM: CT CHEST, ABDOMEN, AND PELVIS WITH CONTRAST TECHNIQUE: Multidetector CT imaging of the chest, abdomen and pelvis was performed following the standard protocol during bolus administration of intravenous contrast. CONTRAST:  35mL OMNIPAQUE IOHEXOL 350 MG/ML SOLN COMPARISON:  Multiple exams, including 02/20/2021 and PET-CT from 02/08/2020 FINDINGS: CT CHEST FINDINGS Cardiovascular: Right Port-A-Cath tip: Cavoatrial junction. AICD noted. Coronary, aortic arch, and branch vessel atherosclerotic vascular disease. Small but stable pericardial effusion. Mediastinum/Nodes: Mild distal esophageal wall thickening, esophagitis would be a common cause. No pathologic adenopathy identified. Lungs/Pleura: Extensive consolidation at the right lung apex and throughout much of the right upper lobe with associated volume loss. Cavitary process anteriorly along the right upper lobe similar to previous. Stable 3 mm right lower lobe nodule on image 98 series 6. Emphysema. Stable 0.7 cm ground-glass density left upper lobe nodule on image 46 series 6. Stable 0.4 cm in thickness subpleural nodule along the left lower lobe side of the major fissure on image 74 series 6. Stable 3 mm left upper lobe nodule on image 79 series 6. Stable calcified pleural based nodule along the left lower lobe on image 97 series 6. Airway thickening in the right middle lobe and right lower lobe along the hilum. Musculoskeletal: Lower thoracic spondylosis. Stable mild periostitis anteriorly along the right third and fourth ribs, possibly therapy related. CT ABDOMEN PELVIS FINDINGS Hepatobiliary: Cholecystectomy.  Otherwise unremarkable. Pancreas: Unremarkable Spleen: Upper normal splenic size. Adrenals/Urinary Tract: 5 mm hypodense right mid kidney lesion on image 100 series 4. This  is likely a cyst although technically too small to characterize. Adrenal glands unremarkable. Stomach/Bowel: Unremarkable Vascular/Lymphatic: Aortoiliac atherosclerotic vascular disease. No pathologic adenopathy identified. Left external iliac artery stent. Non patent right external iliac artery. Reproductive: Small size of the prostate gland. Other: Mild nonspecific presacral stranding. Musculoskeletal: Unremarkable IMPRESSION: 1. Stable appearance of the right upper lobe with volume loss which is likely therapy related, and also in anterior cavitary lesion which could be from therapy related necrosis, tumor necrosis, or chronic fungal infection. This is not appreciably changed from 02/20/2021. 2. Airway thickening centrally in the right middle lobe and right lower lobe. 3. Several stable small pulmonary nodules as noted above, continued surveillance suggested. 4. Stable small pericardial effusion. 5. Other imaging findings of potential clinical significance: Aortic Atherosclerosis (ICD10-I70.0) and Emphysema (ICD10-J43.9). Stable mild periostitis anteriorly along the right third and fourth ribs, likely therapy related. Electronically Signed   By: Van Clines M.D.   On: 05/04/2021 08:24   CT Abdomen Pelvis W Contrast  Result Date: 05/04/2021 CLINICAL DATA:  Non-small cell lung cancer staging. Chemotherapy complete 05/15/2018. Radiation therapy in 2019. Ongoing chemotherapy. Reduced appetite and weight loss. EXAM: CT CHEST, ABDOMEN, AND PELVIS WITH CONTRAST TECHNIQUE: Multidetector CT imaging of the chest, abdomen and pelvis was performed following the standard protocol during bolus administration of intravenous contrast. CONTRAST:  50mL OMNIPAQUE IOHEXOL 350 MG/ML SOLN COMPARISON:  Multiple exams, including 02/20/2021 and PET-CT from 02/08/2020 FINDINGS: CT CHEST FINDINGS Cardiovascular: Right Port-A-Cath tip: Cavoatrial junction. AICD noted. Coronary, aortic arch, and branch vessel atherosclerotic  vascular disease. Small but stable pericardial effusion. Mediastinum/Nodes: Mild distal esophageal wall thickening, esophagitis would be a common cause. No pathologic adenopathy identified. Lungs/Pleura: Extensive consolidation at the right lung apex and throughout much of the right upper lobe with associated volume loss. Cavitary process anteriorly along the right upper lobe similar to previous. Stable  3 mm right lower lobe nodule on image 98 series 6. Emphysema. Stable 0.7 cm ground-glass density left upper lobe nodule on image 46 series 6. Stable 0.4 cm in thickness subpleural nodule along the left lower lobe side of the major fissure on image 74 series 6. Stable 3 mm left upper lobe nodule on image 79 series 6. Stable calcified pleural based nodule along the left lower lobe on image 97 series 6. Airway thickening in the right middle lobe and right lower lobe along the hilum. Musculoskeletal: Lower thoracic spondylosis. Stable mild periostitis anteriorly along the right third and fourth ribs, possibly therapy related. CT ABDOMEN PELVIS FINDINGS Hepatobiliary: Cholecystectomy.  Otherwise unremarkable. Pancreas: Unremarkable Spleen: Upper normal splenic size. Adrenals/Urinary Tract: 5 mm hypodense right mid kidney lesion on image 100 series 4. This is likely a cyst although technically too small to characterize. Adrenal glands unremarkable. Stomach/Bowel: Unremarkable Vascular/Lymphatic: Aortoiliac atherosclerotic vascular disease. No pathologic adenopathy identified. Left external iliac artery stent. Non patent right external iliac artery. Reproductive: Small size of the prostate gland. Other: Mild nonspecific presacral stranding. Musculoskeletal: Unremarkable IMPRESSION: 1. Stable appearance of the right upper lobe with volume loss which is likely therapy related, and also in anterior cavitary lesion which could be from therapy related necrosis, tumor necrosis, or chronic fungal infection. This is not appreciably  changed from 02/20/2021. 2. Airway thickening centrally in the right middle lobe and right lower lobe. 3. Several stable small pulmonary nodules as noted above, continued surveillance suggested. 4. Stable small pericardial effusion. 5. Other imaging findings of potential clinical significance: Aortic Atherosclerosis (ICD10-I70.0) and Emphysema (ICD10-J43.9). Stable mild periostitis anteriorly along the right third and fourth ribs, likely therapy related. Electronically Signed   By: Van Clines M.D.   On: 05/04/2021 08:24     ASSESSMENT/PLAN:  This is a very pleasant 60 year old Caucasian male with recurrent lung cancer initially diagnosed as stage IIIa non-small cell lung cancer, adenosquamous carcinoma.  He presented with a large right upper lobe lung mass with questionable chest wall invasion as well as a right hilar and mediastinal lymphadenopathy.  He was diagnosed in June 2019. His PDL1 expression is 99%   He completed 6 cycles of concurrent chemoradiation with carboplatin and paclitaxel.  He had a partial response to treatment.   The patient underwent consolidation immunotherapy with Imfinzi 10 mg/kg IV every 2 weeks.  He is status post 26 cycles.  He completed this on 07/04/2019   He was on observation until he showed evidence of local recurrence.  He then underwent immunotherapy with Keytruda 200 mg IV every 3 weeks. He is status post 9 cycles. He tolerated this well without any concerning complains. The patient has missed several appointments since November 2021. He only returned to the clinic once for treatment in that interval. Therefore, on his follow up imaging studies in March 2022, he had evidence of disease progession.  He restarted treatment at that time and continued to tolerate it well. Unfortunately, he had evidence for disease progression in May 2022 and this was discontinued.   The patient is currently undergoing treatment with systemic chemotherapy with carboplatin for an  AUC of 5 and Alimta 500 mg/m IV every 3 weeks. He is status post 4 cycles.  His dose of carboplatin was reduced to AUC of 4 and his Alimta was reduced to 400 mg per metered squared due to intolerance and myelosuppression. Dr. Julien Nordmann would like to do 6 cycles of carboplatin for an AUC of 4 and Alimta 400  mg/m2. Starting from cycle #7, he will start maintenance single agent Alimta 400 mg/m2.    Labs were reviewed. His Hbg is 7.9. He denies any bleeding except for bruising/bleeding scabs on his upper extremities. After some dsicussion, he was agreeable to let us give him 1 unit of blood today. I have called his wife to let her know as well since the patient has memory deficits. We will continue to monitor his labs closely every week. I will arrange for a sample to blood bank to be performed weekly. We will arrange for a blood transfusion if Hbg <8.   Recommend that he also proceed with cycle #5 today as scheduled.  We will continue to monitor his labs closely every week.   The patient was advised to call immediately if he has any concerning symptoms in the interval. The patient voices understanding of current disease status and treatment options and is in agreement with the current care plan. All questions were answered. The patient knows to call the clinic with any problems, questions or concerns. We can certainly see the patient much sooner if necessary                Orders Placed This Encounter  Procedures   Informed Consent Details: Physician/Practitioner Attestation; Transcribe to consent form and obtain patient signature    Standing Status:   Future    Standing Expiration Date:   05/26/2022    Order Specific Question:   Physician/Practitioner attestation of informed consent for blood and or blood product transfusion    Answer:   I, the physician/practitioner, attest that I have discussed with the patient the benefits, risks, side effects, alternatives, likelihood of achieving  goals and potential problems during recovery for the procedure that I have provided informed consent.    Order Specific Question:   Product(s)    Answer:   All Product(s)   Care order/instruction    Transfuse Parameters    Standing Status:   Future    Standing Expiration Date:   05/26/2022   Type and screen         Standing Status:   Future    Number of Occurrences:   1    Standing Expiration Date:   05/26/2022   Prepare RBC (crossmatch)    Standing Status:   Standing    Number of Occurrences:   1    Order Specific Question:   # of Units    Answer:   1 unit    Order Specific Question:   Transfusion Indications    Answer:   Symptomatic Anemia    Order Specific Question:   Number of Units to Keep Ahead    Answer:   NO units ahead    Order Specific Question:   If emergent release call blood bank    Answer:   Not emergent release   Sample to Blood Bank    Standing Status:   Standing    Number of Occurrences:   4    Standing Expiration Date:   05/26/2022     The total time spent in the appointment was 30-39 minutes. Kaelynn Igo L Berthe Oley, PA-C 05/26/21

## 2021-05-26 ENCOUNTER — Other Ambulatory Visit: Payer: Self-pay

## 2021-05-26 ENCOUNTER — Encounter: Payer: Self-pay | Admitting: Physician Assistant

## 2021-05-26 ENCOUNTER — Inpatient Hospital Stay: Payer: Medicare Other

## 2021-05-26 ENCOUNTER — Inpatient Hospital Stay (HOSPITAL_BASED_OUTPATIENT_CLINIC_OR_DEPARTMENT_OTHER): Payer: Medicare Other | Admitting: Physician Assistant

## 2021-05-26 VITALS — BP 100/78 | HR 93 | Temp 97.4°F | Resp 19 | Ht 72.0 in | Wt 187.8 lb

## 2021-05-26 VITALS — BP 98/72 | HR 83 | Temp 97.3°F | Resp 18

## 2021-05-26 DIAGNOSIS — D6481 Anemia due to antineoplastic chemotherapy: Secondary | ICD-10-CM

## 2021-05-26 DIAGNOSIS — C3491 Malignant neoplasm of unspecified part of right bronchus or lung: Secondary | ICD-10-CM

## 2021-05-26 DIAGNOSIS — E039 Hypothyroidism, unspecified: Secondary | ICD-10-CM | POA: Diagnosis not present

## 2021-05-26 DIAGNOSIS — T451X5A Adverse effect of antineoplastic and immunosuppressive drugs, initial encounter: Secondary | ICD-10-CM | POA: Diagnosis not present

## 2021-05-26 DIAGNOSIS — C3490 Malignant neoplasm of unspecified part of unspecified bronchus or lung: Secondary | ICD-10-CM

## 2021-05-26 DIAGNOSIS — Z95828 Presence of other vascular implants and grafts: Secondary | ICD-10-CM

## 2021-05-26 DIAGNOSIS — C3411 Malignant neoplasm of upper lobe, right bronchus or lung: Secondary | ICD-10-CM | POA: Diagnosis not present

## 2021-05-26 DIAGNOSIS — D649 Anemia, unspecified: Secondary | ICD-10-CM

## 2021-05-26 DIAGNOSIS — D61810 Antineoplastic chemotherapy induced pancytopenia: Secondary | ICD-10-CM | POA: Diagnosis not present

## 2021-05-26 DIAGNOSIS — Z5111 Encounter for antineoplastic chemotherapy: Secondary | ICD-10-CM

## 2021-05-26 DIAGNOSIS — Z86718 Personal history of other venous thrombosis and embolism: Secondary | ICD-10-CM | POA: Diagnosis not present

## 2021-05-26 DIAGNOSIS — Z79899 Other long term (current) drug therapy: Secondary | ICD-10-CM | POA: Diagnosis not present

## 2021-05-26 DIAGNOSIS — Z7982 Long term (current) use of aspirin: Secondary | ICD-10-CM | POA: Diagnosis not present

## 2021-05-26 LAB — CBC WITH DIFFERENTIAL (CANCER CENTER ONLY)
Abs Immature Granulocytes: 0.54 10*3/uL — ABNORMAL HIGH (ref 0.00–0.07)
Basophils Absolute: 0 10*3/uL (ref 0.0–0.1)
Basophils Relative: 1 %
Eosinophils Absolute: 0 10*3/uL (ref 0.0–0.5)
Eosinophils Relative: 0 %
HCT: 24.6 % — ABNORMAL LOW (ref 39.0–52.0)
Hemoglobin: 7.9 g/dL — ABNORMAL LOW (ref 13.0–17.0)
Immature Granulocytes: 12 %
Lymphocytes Relative: 11 %
Lymphs Abs: 0.5 10*3/uL — ABNORMAL LOW (ref 0.7–4.0)
MCH: 28.6 pg (ref 26.0–34.0)
MCHC: 32.1 g/dL (ref 30.0–36.0)
MCV: 89.1 fL (ref 80.0–100.0)
Monocytes Absolute: 0.8 10*3/uL (ref 0.1–1.0)
Monocytes Relative: 18 %
Neutro Abs: 2.6 10*3/uL (ref 1.7–7.7)
Neutrophils Relative %: 58 %
Platelet Count: 226 10*3/uL (ref 150–400)
RBC: 2.76 MIL/uL — ABNORMAL LOW (ref 4.22–5.81)
RDW: 23.4 % — ABNORMAL HIGH (ref 11.5–15.5)
WBC Count: 4.4 10*3/uL (ref 4.0–10.5)
nRBC: 0.7 % — ABNORMAL HIGH (ref 0.0–0.2)

## 2021-05-26 LAB — CMP (CANCER CENTER ONLY)
ALT: 22 U/L (ref 0–44)
AST: 23 U/L (ref 15–41)
Albumin: 3 g/dL — ABNORMAL LOW (ref 3.5–5.0)
Alkaline Phosphatase: 123 U/L (ref 38–126)
Anion gap: 9 (ref 5–15)
BUN: 11 mg/dL (ref 6–20)
CO2: 30 mmol/L (ref 22–32)
Calcium: 8.5 mg/dL — ABNORMAL LOW (ref 8.9–10.3)
Chloride: 97 mmol/L — ABNORMAL LOW (ref 98–111)
Creatinine: 0.93 mg/dL (ref 0.61–1.24)
GFR, Estimated: >60 mL/min
Glucose, Bld: 138 mg/dL — ABNORMAL HIGH (ref 70–99)
Potassium: 3.3 mmol/L — ABNORMAL LOW (ref 3.5–5.1)
Sodium: 136 mmol/L (ref 135–145)
Total Bilirubin: 0.3 mg/dL (ref 0.3–1.2)
Total Protein: 6.4 g/dL — ABNORMAL LOW (ref 6.5–8.1)

## 2021-05-26 LAB — SAMPLE TO BLOOD BANK

## 2021-05-26 LAB — PREPARE RBC (CROSSMATCH)

## 2021-05-26 LAB — TSH: TSH: 9.389 u[IU]/mL — ABNORMAL HIGH (ref 0.320–4.118)

## 2021-05-26 MED ORDER — SODIUM CHLORIDE 0.9 % IV SOLN: Freq: Once | INTRAVENOUS | Status: AC

## 2021-05-26 MED ORDER — ACETAMINOPHEN 325 MG PO TABS
650.0000 mg | ORAL_TABLET | Freq: Once | ORAL | Status: AC
  Administered 2021-05-26: 650 mg via ORAL
  Filled 2021-05-26: qty 2

## 2021-05-26 MED ORDER — SODIUM CHLORIDE 0.9% FLUSH
10.0000 mL | INTRAVENOUS | Status: DC | PRN
  Administered 2021-05-26: 10 mL

## 2021-05-26 MED ORDER — SODIUM CHLORIDE 0.9% FLUSH
10.0000 mL | Freq: Once | INTRAVENOUS | Status: AC
  Administered 2021-05-26: 10 mL

## 2021-05-26 MED ORDER — PALONOSETRON HCL INJECTION 0.25 MG/5ML
0.2500 mg | Freq: Once | INTRAVENOUS | Status: AC
  Administered 2021-05-26: 0.25 mg via INTRAVENOUS
  Filled 2021-05-26: qty 5

## 2021-05-26 MED ORDER — FAMOTIDINE 20 MG IN NS 100 ML IVPB
20.0000 mg | Freq: Once | INTRAVENOUS | Status: AC
  Administered 2021-05-26: 20 mg via INTRAVENOUS
  Filled 2021-05-26: qty 100

## 2021-05-26 MED ORDER — SODIUM CHLORIDE 0.9 % IV SOLN
10.0000 mg | Freq: Once | INTRAVENOUS | Status: AC
  Administered 2021-05-26: 10 mg via INTRAVENOUS
  Filled 2021-05-26: qty 10

## 2021-05-26 MED ORDER — CARBOPLATIN CHEMO INJECTION 600 MG/60ML
520.0000 mg | Freq: Once | INTRAVENOUS | Status: AC
  Administered 2021-05-26: 520 mg via INTRAVENOUS
  Filled 2021-05-26: qty 52

## 2021-05-26 MED ORDER — DIPHENHYDRAMINE HCL 25 MG PO CAPS
25.0000 mg | ORAL_CAPSULE | Freq: Once | ORAL | Status: AC
  Administered 2021-05-26: 25 mg via ORAL
  Filled 2021-05-26: qty 1

## 2021-05-26 MED ORDER — FOSAPREPITANT DIMEGLUMINE INJECTION 150 MG
150.0000 mg | Freq: Once | INTRAVENOUS | Status: AC
  Administered 2021-05-26: 150 mg via INTRAVENOUS
  Filled 2021-05-26: qty 150

## 2021-05-26 MED ORDER — SODIUM CHLORIDE 0.9 % IV SOLN
400.0000 mg/m2 | Freq: Once | INTRAVENOUS | Status: AC
  Administered 2021-05-26: 850 mg via INTRAVENOUS
  Filled 2021-05-26: qty 20

## 2021-05-26 MED ORDER — HEPARIN SOD (PORK) LOCK FLUSH 100 UNIT/ML IV SOLN
500.0000 [IU] | Freq: Once | INTRAVENOUS | Status: AC | PRN
  Administered 2021-05-26: 500 [IU]

## 2021-05-26 MED ORDER — LORATADINE 10 MG PO TABS
10.0000 mg | ORAL_TABLET | Freq: Once | ORAL | Status: AC
  Administered 2021-05-26: 10 mg via ORAL
  Filled 2021-05-26: qty 1

## 2021-05-26 MED ORDER — SODIUM CHLORIDE 0.9% IV SOLUTION
250.0000 mL | Freq: Once | INTRAVENOUS | Status: AC
  Administered 2021-05-26: 250 mL via INTRAVENOUS

## 2021-05-26 NOTE — Progress Notes (Signed)
Per Cassie Heilingoetter, PA ok to treat with Hgb 7.9

## 2021-05-26 NOTE — Progress Notes (Signed)
Per [facility] VO to change carbo to calc 520mg  dose

## 2021-05-26 NOTE — Patient Instructions (Signed)
[facility]  Discharge Instructions: Thank you for choosing [facility] to provide your oncology and hematology care.   If you have a lab appointment with the [facility], please go directly to the [facility] and check in at the registration area.   Wear comfortable clothing and clothing appropriate for easy access to any Portacath or PICC line.   We strive to give you quality time with your provider. You may need to reschedule your appointment if you arrive late (15 or more minutes).  Arriving late affects you and other patients whose appointments are after yours.  Also, if you miss three or more appointments without notifying the office, you may be dismissed from the clinic at the provider's discretion.      For prescription refill requests, have your pharmacy contact our office and allow 72 hours for refills to be completed.    Today you received the following chemotherapy and/or immunotherapy agents pemetrexed, carboplatin      To help prevent nausea and vomiting after your treatment, we encourage you to take your nausea medication as directed.  BELOW ARE SYMPTOMS THAT SHOULD BE REPORTED IMMEDIATELY: *FEVER GREATER THAN 100.4 F (38 C) OR HIGHER *CHILLS OR SWEATING *NAUSEA AND VOMITING THAT IS NOT CONTROLLED WITH YOUR NAUSEA MEDICATION *UNUSUAL SHORTNESS OF BREATH *UNUSUAL BRUISING OR BLEEDING *URINARY PROBLEMS (pain or burning when urinating, or frequent urination) *BOWEL PROBLEMS (unusual diarrhea, constipation, pain near the anus) TENDERNESS IN MOUTH AND THROAT WITH OR WITHOUT PRESENCE OF ULCERS (sore throat, sores in mouth, or a toothache) UNUSUAL RASH, SWELLING OR PAIN  UNUSUAL VAGINAL DISCHARGE OR ITCHING   Items with * indicate a potential emergency and should be followed up as soon as possible or go to the Emergency Department if any problems should occur.  Please show the CHEMOTHERAPY ALERT CARD or IMMUNOTHERAPY ALERT CARD at  check-in to the Emergency Department and triage nurse.  Should you have questions after your visit or need to cancel or reschedule your appointment, please contact [facility]  Dept: [phone]  and follow the prompts.  Office hours are 8:00 a.m. to 4:30 p.m. Monday - Friday. Please note that voicemails left after 4:00 p.m. may not be returned until the following business day.  We are closed weekends and major holidays. You have access to a nurse at all times for urgent questions. Please call the main number to the clinic Dept: [phone] and follow the prompts.   For any non-urgent questions, you may also contact your provider using MyChart. We now offer e-Visits for anyone 28 and older to request care online for non-urgent symptoms. For details visit [email or web address].   Also download the MyChart app! Go to the app store, search "MyChart", open the app, select [facility], and log in with your MyChart username and password.  Due to Covid, a mask is required upon entering the hospital/clinic. If you do not have a mask, one will be given to you upon arrival. For doctor visits, patients may have 1 support person aged 26 or older with them. For treatment visits, patients cannot have anyone with them due to current Covid guidelines and our immunocompromised population.

## 2021-05-27 ENCOUNTER — Telehealth: Payer: Self-pay | Admitting: Physician Assistant

## 2021-05-27 ENCOUNTER — Telehealth: Payer: Self-pay | Admitting: Medical Oncology

## 2021-05-27 LAB — BPAM RBC
Blood Product Expiration Date: 202209212359
ISSUE DATE / TIME: 202208301519
Unit Type and Rh: 6200

## 2021-05-27 LAB — TYPE AND SCREEN
ABO/RH(D): A POS
Antibody Screen: NEGATIVE
Unit division: 0

## 2021-05-27 NOTE — Telephone Encounter (Signed)
Returned wife's call about # PRBC did Mattheus receive? I told her only one per Cassie -that is all that was ordered.  Blood on toilet paper today after BM  Geoff reported he has been constipated and is going to increase his stool softener. I told him to call for any increased bleeding from rectum or blood in stool or toilet water.   TSH result called to [facility] and he said he is taking synthroid 125 mcg/day .  [name] read the label on the bottle of synthroid and said her  received # 33 in June .

## 2021-05-27 NOTE — Telephone Encounter (Signed)
Scheduled appointment per 08/30 los. Left message.

## 2021-06-02 ENCOUNTER — Inpatient Hospital Stay: Payer: Medicare Other | Attending: Physician Assistant

## 2021-06-02 ENCOUNTER — Other Ambulatory Visit: Payer: Self-pay

## 2021-06-02 DIAGNOSIS — C3411 Malignant neoplasm of upper lobe, right bronchus or lung: Secondary | ICD-10-CM | POA: Insufficient documentation

## 2021-06-02 DIAGNOSIS — C3490 Malignant neoplasm of unspecified part of unspecified bronchus or lung: Secondary | ICD-10-CM

## 2021-06-02 DIAGNOSIS — D61818 Other pancytopenia: Secondary | ICD-10-CM | POA: Diagnosis not present

## 2021-06-02 DIAGNOSIS — Z7952 Long term (current) use of systemic steroids: Secondary | ICD-10-CM | POA: Diagnosis not present

## 2021-06-02 DIAGNOSIS — E876 Hypokalemia: Secondary | ICD-10-CM | POA: Insufficient documentation

## 2021-06-02 DIAGNOSIS — F17210 Nicotine dependence, cigarettes, uncomplicated: Secondary | ICD-10-CM | POA: Insufficient documentation

## 2021-06-02 DIAGNOSIS — Z79899 Other long term (current) drug therapy: Secondary | ICD-10-CM | POA: Diagnosis not present

## 2021-06-02 DIAGNOSIS — Z86718 Personal history of other venous thrombosis and embolism: Secondary | ICD-10-CM | POA: Diagnosis not present

## 2021-06-02 DIAGNOSIS — D6481 Anemia due to antineoplastic chemotherapy: Secondary | ICD-10-CM

## 2021-06-02 DIAGNOSIS — Z7982 Long term (current) use of aspirin: Secondary | ICD-10-CM | POA: Diagnosis not present

## 2021-06-02 DIAGNOSIS — E7800 Pure hypercholesterolemia, unspecified: Secondary | ICD-10-CM | POA: Insufficient documentation

## 2021-06-02 DIAGNOSIS — Z5112 Encounter for antineoplastic immunotherapy: Secondary | ICD-10-CM | POA: Diagnosis present

## 2021-06-02 DIAGNOSIS — I252 Old myocardial infarction: Secondary | ICD-10-CM | POA: Insufficient documentation

## 2021-06-02 DIAGNOSIS — C3491 Malignant neoplasm of unspecified part of right bronchus or lung: Secondary | ICD-10-CM

## 2021-06-02 DIAGNOSIS — Z5111 Encounter for antineoplastic chemotherapy: Secondary | ICD-10-CM | POA: Diagnosis not present

## 2021-06-02 DIAGNOSIS — Z95828 Presence of other vascular implants and grafts: Secondary | ICD-10-CM

## 2021-06-02 DIAGNOSIS — T451X5A Adverse effect of antineoplastic and immunosuppressive drugs, initial encounter: Secondary | ICD-10-CM

## 2021-06-02 LAB — CBC WITH DIFFERENTIAL (CANCER CENTER ONLY)
Abs Immature Granulocytes: 0.01 10*3/uL (ref 0.00–0.07)
Basophils Absolute: 0 10*3/uL (ref 0.0–0.1)
Basophils Relative: 0 %
Eosinophils Absolute: 0 10*3/uL (ref 0.0–0.5)
Eosinophils Relative: 1 %
HCT: 28.9 % — ABNORMAL LOW (ref 39.0–52.0)
Hemoglobin: 9.5 g/dL — ABNORMAL LOW (ref 13.0–17.0)
Immature Granulocytes: 0 %
Lymphocytes Relative: 13 %
Lymphs Abs: 0.5 10*3/uL — ABNORMAL LOW (ref 0.7–4.0)
MCH: 29.7 pg (ref 26.0–34.0)
MCHC: 32.9 g/dL (ref 30.0–36.0)
MCV: 90.3 fL (ref 80.0–100.0)
Monocytes Absolute: 0.1 10*3/uL (ref 0.1–1.0)
Monocytes Relative: 3 %
Neutro Abs: 3.2 10*3/uL (ref 1.7–7.7)
Neutrophils Relative %: 83 %
Platelet Count: 147 10*3/uL — ABNORMAL LOW (ref 150–400)
RBC: 3.2 MIL/uL — ABNORMAL LOW (ref 4.22–5.81)
RDW: 21.2 % — ABNORMAL HIGH (ref 11.5–15.5)
WBC Count: 3.8 10*3/uL — ABNORMAL LOW (ref 4.0–10.5)
nRBC: 0 % (ref 0.0–0.2)

## 2021-06-02 LAB — CMP (CANCER CENTER ONLY)
ALT: 40 U/L (ref 0–44)
AST: 43 U/L — ABNORMAL HIGH (ref 15–41)
Albumin: 3.4 g/dL — ABNORMAL LOW (ref 3.5–5.0)
Alkaline Phosphatase: 104 U/L (ref 38–126)
Anion gap: 13 (ref 5–15)
BUN: 9 mg/dL (ref 6–20)
CO2: 30 mmol/L (ref 22–32)
Calcium: 8.9 mg/dL (ref 8.9–10.3)
Chloride: 94 mmol/L — ABNORMAL LOW (ref 98–111)
Creatinine: 0.8 mg/dL (ref 0.61–1.24)
GFR, Estimated: >60 mL/min (ref >60)
Glucose, Bld: 105 mg/dL — ABNORMAL HIGH (ref 70–99)
Potassium: 3.4 mmol/L — ABNORMAL LOW (ref 3.5–5.1)
Sodium: 137 mmol/L (ref 135–145)
Total Bilirubin: 0.6 mg/dL (ref 0.3–1.2)
Total Protein: 6.7 g/dL (ref 6.5–8.1)

## 2021-06-02 LAB — SAMPLE TO BLOOD BANK

## 2021-06-02 LAB — TSH: TSH: 15.807 u[IU]/mL — ABNORMAL HIGH (ref 0.320–4.118)

## 2021-06-02 MED ORDER — SODIUM CHLORIDE 0.9% FLUSH
10.0000 mL | Freq: Once | INTRAVENOUS | Status: AC
  Administered 2021-06-02: 10 mL

## 2021-06-02 MED ORDER — HEPARIN SOD (PORK) LOCK FLUSH 100 UNIT/ML IV SOLN
500.0000 [IU] | Freq: Once | INTRAVENOUS | Status: AC
  Administered 2021-06-02: 500 [IU]

## 2021-06-03 ENCOUNTER — Ambulatory Visit (INDEPENDENT_AMBULATORY_CARE_PROVIDER_SITE_OTHER): Payer: Medicare Other

## 2021-06-03 DIAGNOSIS — I255 Ischemic cardiomyopathy: Secondary | ICD-10-CM | POA: Diagnosis not present

## 2021-06-04 DIAGNOSIS — G894 Chronic pain syndrome: Secondary | ICD-10-CM | POA: Diagnosis not present

## 2021-06-04 DIAGNOSIS — C3490 Malignant neoplasm of unspecified part of unspecified bronchus or lung: Secondary | ICD-10-CM | POA: Diagnosis not present

## 2021-06-04 DIAGNOSIS — G546 Phantom limb syndrome with pain: Secondary | ICD-10-CM | POA: Diagnosis not present

## 2021-06-04 LAB — CUP PACEART REMOTE DEVICE CHECK
Battery Remaining Longevity: 110 mo
Battery Voltage: 2.99 V
Brady Statistic RV Percent Paced: 0 %
Date Time Interrogation Session: 20220908134228
HighPow Impedance: 64 Ohm
Implantable Lead Implant Date: 20190606
Implantable Lead Location: 753860
Implantable Pulse Generator Implant Date: 20190606
Lead Channel Impedance Value: 285 Ohm
Lead Channel Impedance Value: 342 Ohm
Lead Channel Pacing Threshold Amplitude: 1 V
Lead Channel Pacing Threshold Pulse Width: 0.4 ms
Lead Channel Sensing Intrinsic Amplitude: 15.375 mV
Lead Channel Sensing Intrinsic Amplitude: 15.375 mV
Lead Channel Setting Pacing Amplitude: 2.5 V
Lead Channel Setting Pacing Pulse Width: 0.4 ms
Lead Channel Setting Sensing Sensitivity: 0.3 mV

## 2021-06-05 ENCOUNTER — Telehealth: Payer: Self-pay

## 2021-06-05 NOTE — Telephone Encounter (Signed)
"  Scheduled remote reviewed. Normal device function.   Optivol elevated sent to triage. Next remote 91 days"  Remote exported to Dr. Curt Bears to review. Spoke with patient and wife. Patient denies s/s of fluid overload including swelling of hands, feet, or abdomen, more increased shortness of breath, or palpitations. Patient continues to receive supportive oncology treatment including blood transfusions and chemotherapy with fluids. Optivol has been elevated since July. Routein oncology follow ups. Patient is due for yearly recall with Dr. Curt Bears 07/09/21. Patient and wife made aware that recall has been sent. Provided device clinic contact if additional symptoms should arise.

## 2021-06-09 ENCOUNTER — Other Ambulatory Visit: Payer: Self-pay | Admitting: Physician Assistant

## 2021-06-09 ENCOUNTER — Inpatient Hospital Stay: Payer: Medicare Other

## 2021-06-09 ENCOUNTER — Other Ambulatory Visit: Payer: Self-pay

## 2021-06-09 ENCOUNTER — Telehealth: Payer: Self-pay

## 2021-06-09 DIAGNOSIS — Z7982 Long term (current) use of aspirin: Secondary | ICD-10-CM | POA: Diagnosis not present

## 2021-06-09 DIAGNOSIS — E876 Hypokalemia: Secondary | ICD-10-CM

## 2021-06-09 DIAGNOSIS — Z5111 Encounter for antineoplastic chemotherapy: Secondary | ICD-10-CM | POA: Diagnosis not present

## 2021-06-09 DIAGNOSIS — C3411 Malignant neoplasm of upper lobe, right bronchus or lung: Secondary | ICD-10-CM | POA: Diagnosis not present

## 2021-06-09 DIAGNOSIS — Z95828 Presence of other vascular implants and grafts: Secondary | ICD-10-CM

## 2021-06-09 DIAGNOSIS — F17210 Nicotine dependence, cigarettes, uncomplicated: Secondary | ICD-10-CM | POA: Diagnosis not present

## 2021-06-09 DIAGNOSIS — C3490 Malignant neoplasm of unspecified part of unspecified bronchus or lung: Secondary | ICD-10-CM

## 2021-06-09 DIAGNOSIS — D61818 Other pancytopenia: Secondary | ICD-10-CM | POA: Diagnosis not present

## 2021-06-09 DIAGNOSIS — I252 Old myocardial infarction: Secondary | ICD-10-CM | POA: Diagnosis not present

## 2021-06-09 DIAGNOSIS — Z7952 Long term (current) use of systemic steroids: Secondary | ICD-10-CM | POA: Diagnosis not present

## 2021-06-09 DIAGNOSIS — D649 Anemia, unspecified: Secondary | ICD-10-CM

## 2021-06-09 DIAGNOSIS — C3491 Malignant neoplasm of unspecified part of right bronchus or lung: Secondary | ICD-10-CM

## 2021-06-09 DIAGNOSIS — Z86718 Personal history of other venous thrombosis and embolism: Secondary | ICD-10-CM | POA: Diagnosis not present

## 2021-06-09 DIAGNOSIS — Z79899 Other long term (current) drug therapy: Secondary | ICD-10-CM | POA: Diagnosis not present

## 2021-06-09 DIAGNOSIS — D6481 Anemia due to antineoplastic chemotherapy: Secondary | ICD-10-CM

## 2021-06-09 DIAGNOSIS — T451X5A Adverse effect of antineoplastic and immunosuppressive drugs, initial encounter: Secondary | ICD-10-CM

## 2021-06-09 DIAGNOSIS — E7800 Pure hypercholesterolemia, unspecified: Secondary | ICD-10-CM | POA: Diagnosis not present

## 2021-06-09 LAB — CBC WITH DIFFERENTIAL (CANCER CENTER ONLY)
Abs Immature Granulocytes: 0.02 10*3/uL (ref 0.00–0.07)
Basophils Absolute: 0 10*3/uL (ref 0.0–0.1)
Basophils Relative: 1 %
Eosinophils Absolute: 0.1 10*3/uL (ref 0.0–0.5)
Eosinophils Relative: 10 %
HCT: 22.1 % — ABNORMAL LOW (ref 39.0–52.0)
Hemoglobin: 7.4 g/dL — ABNORMAL LOW (ref 13.0–17.0)
Immature Granulocytes: 2 %
Lymphocytes Relative: 29 %
Lymphs Abs: 0.4 10*3/uL — ABNORMAL LOW (ref 0.7–4.0)
MCH: 29.5 pg (ref 26.0–34.0)
MCHC: 33.5 g/dL (ref 30.0–36.0)
MCV: 88 fL (ref 80.0–100.0)
Monocytes Absolute: 0.2 10*3/uL (ref 0.1–1.0)
Monocytes Relative: 13 %
Neutro Abs: 0.6 10*3/uL — ABNORMAL LOW (ref 1.7–7.7)
Neutrophils Relative %: 45 %
Platelet Count: 15 10*3/uL — ABNORMAL LOW (ref 150–400)
RBC: 2.51 MIL/uL — ABNORMAL LOW (ref 4.22–5.81)
RDW: 20.4 % — ABNORMAL HIGH (ref 11.5–15.5)
WBC Count: 1.3 10*3/uL — ABNORMAL LOW (ref 4.0–10.5)
nRBC: 0 % (ref 0.0–0.2)

## 2021-06-09 LAB — CMP (CANCER CENTER ONLY)
ALT: 93 U/L — ABNORMAL HIGH (ref 0–44)
AST: 80 U/L — ABNORMAL HIGH (ref 15–41)
Albumin: 3.2 g/dL — ABNORMAL LOW (ref 3.5–5.0)
Alkaline Phosphatase: 116 U/L (ref 38–126)
Anion gap: 11 (ref 5–15)
BUN: 7 mg/dL (ref 6–20)
CO2: 28 mmol/L (ref 22–32)
Calcium: 7.5 mg/dL — ABNORMAL LOW (ref 8.9–10.3)
Chloride: 96 mmol/L — ABNORMAL LOW (ref 98–111)
Creatinine: 0.86 mg/dL (ref 0.61–1.24)
GFR, Estimated: >60 mL/min (ref >60)
Glucose, Bld: 102 mg/dL — ABNORMAL HIGH (ref 70–99)
Potassium: 2.9 mmol/L — ABNORMAL LOW (ref 3.5–5.1)
Sodium: 135 mmol/L (ref 135–145)
Total Bilirubin: 0.3 mg/dL (ref 0.3–1.2)
Total Protein: 6.4 g/dL — ABNORMAL LOW (ref 6.5–8.1)

## 2021-06-09 LAB — SAMPLE TO BLOOD BANK

## 2021-06-09 MED ORDER — POTASSIUM CHLORIDE CRYS ER 20 MEQ PO TBCR: 20.0000 meq | EXTENDED_RELEASE_TABLET | Freq: Two times a day (BID) | ORAL | 0 refills | Status: DC

## 2021-06-09 MED ORDER — SODIUM CHLORIDE 0.9% FLUSH
10.0000 mL | Freq: Once | INTRAVENOUS | Status: AC
  Administered 2021-06-09: 10 mL

## 2021-06-09 MED ORDER — HEPARIN SOD (PORK) LOCK FLUSH 100 UNIT/ML IV SOLN
500.0000 [IU] | Freq: Once | INTRAVENOUS | Status: AC
  Administered 2021-06-09: 500 [IU]

## 2021-06-09 NOTE — Telephone Encounter (Signed)
Pts Hgb = 7.4, WBC = 1.3, PLT = 15 and ANC = 0.6.  We have arranged for the pt to receive 1 unit of platelets, 2 units of PRBCs and an injection of Zarxio tomorrow at the [facility].  I have called the pt and had an extensive and very lengthy conversation with him and his wife regarding how important it is for him to present on time to his 8:15am appt to receive these infusions and the injection. I also explained the possible consequences, including but not limited to death, if he does not. Pt has been advised if he develops and CP, worsening SOB, weakness and bleeding, to present to the ER immediately. Pt has also been advised he needs to take Folic Acid and potassium daily. They expressed understanding of the severity and urgency of this information and have advised their son will have him to the [facility] by 8:15am tomorrow 06/09/21.

## 2021-06-10 ENCOUNTER — Inpatient Hospital Stay: Payer: Medicare Other

## 2021-06-10 ENCOUNTER — Telehealth: Payer: Self-pay | Admitting: Internal Medicine

## 2021-06-10 DIAGNOSIS — E876 Hypokalemia: Secondary | ICD-10-CM | POA: Diagnosis not present

## 2021-06-10 DIAGNOSIS — Z5111 Encounter for antineoplastic chemotherapy: Secondary | ICD-10-CM | POA: Diagnosis not present

## 2021-06-10 DIAGNOSIS — D61818 Other pancytopenia: Secondary | ICD-10-CM | POA: Diagnosis not present

## 2021-06-10 DIAGNOSIS — Z86718 Personal history of other venous thrombosis and embolism: Secondary | ICD-10-CM | POA: Diagnosis not present

## 2021-06-10 DIAGNOSIS — Z7952 Long term (current) use of systemic steroids: Secondary | ICD-10-CM | POA: Diagnosis not present

## 2021-06-10 DIAGNOSIS — E7800 Pure hypercholesterolemia, unspecified: Secondary | ICD-10-CM | POA: Diagnosis not present

## 2021-06-10 DIAGNOSIS — Z7982 Long term (current) use of aspirin: Secondary | ICD-10-CM | POA: Diagnosis not present

## 2021-06-10 DIAGNOSIS — I252 Old myocardial infarction: Secondary | ICD-10-CM | POA: Diagnosis not present

## 2021-06-10 DIAGNOSIS — F17210 Nicotine dependence, cigarettes, uncomplicated: Secondary | ICD-10-CM | POA: Diagnosis not present

## 2021-06-10 DIAGNOSIS — C3411 Malignant neoplasm of upper lobe, right bronchus or lung: Secondary | ICD-10-CM | POA: Diagnosis not present

## 2021-06-10 DIAGNOSIS — D649 Anemia, unspecified: Secondary | ICD-10-CM

## 2021-06-10 DIAGNOSIS — Z79899 Other long term (current) drug therapy: Secondary | ICD-10-CM | POA: Diagnosis not present

## 2021-06-10 LAB — PREPARE RBC (CROSSMATCH)

## 2021-06-10 MED ORDER — ACETAMINOPHEN 325 MG PO TABS
ORAL_TABLET | ORAL | Status: AC
  Administered 2021-06-10: 650 mg via ORAL
  Filled 2021-06-10: qty 2

## 2021-06-10 MED ORDER — HEPARIN SOD (PORK) LOCK FLUSH 100 UNIT/ML IV SOLN
500.0000 [IU] | Freq: Every day | INTRAVENOUS | Status: AC | PRN
  Administered 2021-06-10: 500 [IU]

## 2021-06-10 MED ORDER — DIPHENHYDRAMINE HCL 25 MG PO CAPS
25.0000 mg | ORAL_CAPSULE | Freq: Once | ORAL | Status: AC
  Administered 2021-06-10: 25 mg via ORAL
  Filled 2021-06-10: qty 1

## 2021-06-10 MED ORDER — ACETAMINOPHEN 325 MG PO TABS: 650.0000 mg | ORAL_TABLET | Freq: Once | ORAL | Status: AC

## 2021-06-10 MED ORDER — SODIUM CHLORIDE 0.9% IV SOLUTION
250.0000 mL | Freq: Once | INTRAVENOUS | Status: AC
  Administered 2021-06-10: 250 mL via INTRAVENOUS

## 2021-06-10 MED ORDER — SODIUM CHLORIDE 0.9% FLUSH
10.0000 mL | INTRAVENOUS | Status: AC | PRN
  Administered 2021-06-10: 10 mL

## 2021-06-10 NOTE — Telephone Encounter (Signed)
Scheduled per sch msg. Called and spoke with patients wife. Confirmed appt

## 2021-06-10 NOTE — Progress Notes (Signed)
Dr. Julien Nordmann aware of pt low BPs throughout transfusion. Per Cassie H. PA educate pt on neutropenic precautions and to stop taking BP meds if BP is low. Pt educated to take his BP prior to taking any medications and if BP is low to not take any medications. Pt educated on neutropenic precautions including good hand washing, avoiding crowds and go to ER for fever. Pt verbalized understanding and had no further questions. Information put on D/C paperwork.

## 2021-06-10 NOTE — Patient Instructions (Addendum)
TAKE YOUR BLOOD PRESSURE PRIOR TO TAKING ANY MEDICATIONS. IF BLOOD PRESSURE IS LOW DO NOT TAKE ANY BLOOD PRESSURE MEDICATIONS! IF YOU GET A FEVER GO TO THE ER AFTER CALLING YOUR DOCTOR. USE GOOD HANDWASHING AND STAY AWAY FROM SICK PEOPLE. WEAR YOUR MASK!   Blood Transfusion, Adult A blood transfusion is a procedure in which you receive blood or a type of blood cell (blood component) through an IV. You may need a blood transfusion when your blood level is low. This may result from a bleeding disorder, illness, injury, or surgery. The blood may come from a donor. You may also be able to donate blood for yourself (autologous blood donation) before a planned surgery. The blood given in a transfusion is made up of different blood components. You may receive: Red blood cells. These carry oxygen to the cells in the body. Platelets. These help your blood to clot. Plasma. This is the liquid part of your blood. It carries proteins and other substances throughout the body. White blood cells. These help you fight infections. If you have hemophilia or another clotting disorder, you may also receive other types of blood products. Tell a health care provider about: Any blood disorders you have. Any previous reactions you have had during a blood transfusion. Any allergies you have. All medicines you are taking, including vitamins, herbs, eye drops, creams, and over-the-counter medicines. Any surgeries you have had. Any medical conditions you have, including any recent fever or cold symptoms. Whether you are pregnant or may be pregnant. What are the risks? Generally, this is a safe procedure. However, problems may occur. The most common problems include: A mild allergic reaction, such as red, swollen areas of skin (hives) and itching. Fever or chills. This may be the body's response to new blood cells received. This may occur during or up to 4 hours after the transfusion. More serious problems may  include: Transfusion-associated circulatory overload (TACO), or too much fluid in the lungs. This may cause breathing problems. A serious allergic reaction, such as difficulty breathing or swelling around the face and lips. Transfusion-related acute lung injury (TRALI), which causes breathing difficulty and low oxygen in the blood. This can occur within hours of the transfusion or several days later. Iron overload. This can happen after receiving many blood transfusions over a period of time. Infection or virus being transmitted. This is rare because donated blood is carefully tested before it is given. Hemolytic transfusion reaction. This is rare. It happens when your body's defense system (immune system)tries to attack the new blood cells. Symptoms may include fever, chills, nausea, low blood pressure, and low back or chest pain. Transfusion-associated graft-versus-host disease (TAGVHD). This is rare. It happens when donated cells attack your body's healthy tissues. What happens before the procedure? Medicines Ask your health care provider about: Changing or stopping your regular medicines. This is especially important if you are taking diabetes medicines or blood thinners. Taking medicines such as aspirin and ibuprofen. These medicines can thin your blood. Do not take these medicines unless your health care provider tells you to take them. Taking over-the-counter medicines, vitamins, herbs, and supplements. General instructions Follow instructions from your health care provider about eating and drinking restrictions. You will have a blood test to determine your blood type. This is necessary to know what kind of blood your body will accept and to match it to the donor blood. If you are going to have a planned surgery, you may be able to do an autologous  blood donation. This may be done in case you need to have a transfusion. You will have your temperature, blood pressure, and pulse monitored  before the transfusion. If you have had an allergic reaction to a transfusion in the past, you may be given medicine to help prevent a reaction. This medicine may be given to you by mouth (orally) or through an IV. Set aside time for the blood transfusion. This procedure generally takes 1-4 hours to complete. What happens during the procedure?  An IV will be inserted into one of your veins. The bag of donated blood will be attached to your IV. The blood will then enter through your vein. Your temperature, blood pressure, and pulse will be monitored regularly during the transfusion. This monitoring is done to detect early signs of a transfusion reaction. Tell your nurse right away if you have any of these symptoms during the transfusion: Shortness of breath or trouble breathing. Chest or back pain. Fever or chills. Hives or itching. If you have any signs or symptoms of a reaction, your transfusion will be stopped and you may be given medicine. When the transfusion is complete, your IV will be removed. Pressure may be applied to the IV site for a few minutes. A bandage (dressing)will be applied. The procedure may vary among health care providers and hospitals. What happens after the procedure? Your temperature, blood pressure, pulse, breathing rate, and blood oxygen level will be monitored until you leave the hospital or clinic. Your blood may be tested to see how you are responding to the transfusion. You may be warmed with fluids or blankets to maintain a normal body temperature. If you receive your blood transfusion in an outpatient setting, you will be told whom to contact to report any reactions. Where to find more information For more information on blood transfusions, visit the American Red Cross: redcross.org Summary A blood transfusion is a procedure in which you receive blood or a type of blood cell (blood component) through an IV. The blood you receive may come from a donor or be  donated by yourself (autologous blood donation) before a planned surgery. The blood given in a transfusion is made up of different blood components. You may receive red blood cells, platelets, plasma, or white blood cells depending on the condition treated. Your temperature, blood pressure, and pulse will be monitored before, during, and after the transfusion. After the transfusion, your blood may be tested to see how your body has responded. This information is not intended to replace advice given to you by your health care provider. Make sure you discuss any questions you have with your health care provider. Document Revised: 07/19/2019 Document Reviewed: 03/08/2019 Elsevier Patient Education  [place].

## 2021-06-11 ENCOUNTER — Inpatient Hospital Stay: Payer: Medicare Other

## 2021-06-11 ENCOUNTER — Telehealth: Payer: Self-pay | Admitting: Medical Oncology

## 2021-06-11 LAB — BPAM PLATELET PHERESIS
Blood Product Expiration Date: 202209172359
ISSUE DATE / TIME: 202209140809
Unit Type and Rh: 6200

## 2021-06-11 LAB — BPAM RBC
Blood Product Expiration Date: 202210122359
Blood Product Expiration Date: 202210122359
ISSUE DATE / TIME: 202209140744
ISSUE DATE / TIME: 202209140744
Unit Type and Rh: 6200
Unit Type and Rh: 6200

## 2021-06-11 LAB — TYPE AND SCREEN
ABO/RH(D): A POS
Antibody Screen: NEGATIVE
Unit division: 0
Unit division: 0

## 2021-06-11 LAB — PREPARE PLATELET PHERESIS: Unit division: 0

## 2021-06-11 NOTE — Telephone Encounter (Signed)
Wife had no questions for me .

## 2021-06-11 NOTE — Progress Notes (Signed)
Remote ICD transmission.   

## 2021-06-12 NOTE — Progress Notes (Signed)
[facility] OFFICE PROGRESS NOTE  Redmond School, MD [address] [place] [address]  DIAGNOSIS: Recurrent lung cancer initially diagnosed as stage IIIA (T3, N2, M0) non-small cell lung cancer, adenosquamous carcinoma diagnosed in June 2019 and presented with large right upper lobe lung mass with questionable chest wall invasion as well as right hilar and mediastinal lymphadenopathy.   Biomarker Findings Tumor Mutational Burden - TMB-High (21 Muts/Mb) Microsatellite status - MS-Stable Genomic Findings For a complete list of the genes assayed, please refer to the Appendix. ATM O0355* CCND2 P281R KRAS G12V CHEK2 T32fs*15 TP53 E298* 7 Disease relevant genes with no reportable alterations: ALK, EGFR, BRAF, MET, RET, ERBB2, ROS1   PDL1 Expression: 99%  PRIOR THERAPY:   1) Concurrent chemoradiation with chemotherapy consisting of weekly carboplatin for an AUC of 2 and paclitaxel 45 mg/m2.  First dose given on 04/03/2018.  Status post 6 cycles.  Last dose was giving 05/15/2018 with partial response. 2) Consolidation treatment with immunotherapy with Imfinzi (Durvalumab) 10 mg/KG every 2 weeks.  First dose 06/20/2018.  Status post 26 cycles. 3) Systemic treatment with immunotherapy with Keytruda 200 mg IV every 3 weeks.  First dose February 27, 2020. Status post 12 cycles. Discontinued due to disease progression.  CURRENT THERAPY: Carboplatin for an AUC of 5 and Alimta 500 mg per metered squared on days 1 IV every 3 weeks.  First dose expected on 03/03/2021. Status post 5 cycle. His dose of carboplatin was reduced to an AUC of 4 and Alimta reduced to 400 mg/m2 due to intolerance. Dr. Julien Nordmann would like to do 5 cycles of carboplatin and Alimta. Starting from cycle #6, we will start him on maintenance Alimta 400 mg/m2.   INTERVAL HISTORY: [name] 60 y.o. male returns to the clinic today for a follow-up visit.  The patient is feeling fair without any concerning complaints.   The patient is currently undergoing dose reduced chemotherapy with carboplatin for an AUC of 4 and Alimta 400 mg per metered square.  Despite this, the patient often has pancytopenia which required supportive care in the interval with 2 units of blood and 1 unit of platelets.  The patient also had neutropenia but unfortunately his insurance company denied short acting G-CSF.  At baseline, he has upper extremity bruising/bleeding; however, this is worse today. He denies any abnormal bleeding such as epistaxis, gingival bleeding, hematemesis, melena except he had a 1-2 drops of bright red blood after having a bowel movement/constipation on a few occassions. Denies blood in the stool.  He denies any recent signs and symptoms of infection including fevers, chills, sore throat, worsening cough, congestion, skin infections, diarrhea, or dysuria. He states he is compliant with his folic acid. Of note, he has some memory deficits at baseline.   He was also found to have hypokalemia in the interval and was sent a prescription for potassium supplements. It is unclear if he picked them up.   His parents are in their 18's and are not in the best of health. The patient is wondering in October if he could go visit them.   The patient denies any night sweats. He has been having some difficulties with weight loss and has previously been seen by member the nutritionist team. His weight is stable. He denies any nausea or vomiting. He sometimes has constipation for which he takes a over-the-counter constipation medication but he is not sure which one.  He denies any chest pain or hemoptysis.  He reports his  baseline dyspnea on exertion and chronic cough for which he unfortunately continues to smoke cigarettes.  He denies any headache or visual changes. He is here today for evaluation and repeat blood work before starting cycle #6.   MEDICAL HISTORY: Past Medical History:  Diagnosis Date   Acute hepatitis C virus infection  06/19/2007   Qualifier: Diagnosis of  By: Leilani Merl CMA, Tiffany     Acute ST elevation myocardial infarction (STEMI) involving left anterior descending (LAD) coronary artery (HCC) 05/26/2016   Acute ST elevation myocardial infarction (STEMI) of lateral wall (HCC) 05/26/2016   AICD (automatic cardioverter/defibrillator) present 03/02/2018   Anxiety    Asthma    Atherosclerosis of native arteries of the extremities with intermittent claudication 12/16/2011   Chronic hepatitis C without hepatic coma ([place]) 05/03/2014   COPD with chronic bronchitis and emphysema ([place]) 03/13/2018   FEV1 57%   Depression    DVT (deep venous thrombosis) ([place]) 2009; 2019   RLE; LLE   Encounter for antineoplastic chemotherapy 03/22/2018   Hepatitis C    "tx'd in 2015"   High cholesterol    Ischemic cardiomyopathy 03/02/2018   Non-small cell carcinoma of right lung, stage 3 ([place]) 03/22/2018   PAD (peripheral artery disease) ([place]) 01/25/2013   Peripheral vascular disease, unspecified 07/20/2012   Port-A-Cath in place 04/24/2018   ST elevation myocardial infarction involving left anterior descending (LAD) coronary artery (HCC)    STEMI (ST elevation myocardial infarction) ([place]) 05/26/2016   Tobacco abuse 01/28/2016   Tubular adenoma of colon 07/11/2014   Urinary dribbling     ALLERGIES:  is allergic to lyrica [pregabalin] and neurontin [gabapentin].  MEDICATIONS:  Current Outpatient Medications  Medication Sig Dispense Refill   potassium chloride SA (KLOR-CON) 20 MEQ tablet Take 1 tablet (20 mEq total) by mouth 2 (two) times daily. 14 tablet 0   albuterol (VENTOLIN HFA) 108 (90 Base) MCG/ACT inhaler Inhale 1-2 puffs into the lungs every 4 (four) hours as needed for wheezing or shortness of breath.     alprazolam (XANAX) 2 MG tablet Take 2 mg by mouth 4 (four) times daily as needed for anxiety.     amitriptyline (ELAVIL) 100 MG tablet Take 100 mg by mouth at bedtime.      aspirin 81 MG EC tablet Take 81 mg by mouth daily.      citalopram (CELEXA) 40 MG tablet Take 40 mg by mouth daily.  11   dexamethasone (DECADRON) 4 MG tablet Please take 1 tablet twice a day the day before, the day of, and the day after treatment 40 tablet 2   folic acid (FOLVITE) 1 MG tablet Take 1 tablet (1 mg total) by mouth daily. 30 tablet 2   HYDROcodone-acetaminophen (NORCO) 10-325 MG tablet Take 1 tablet by mouth every 4 (four) hours as needed for moderate pain.   0   levothyroxine (SYNTHROID) 125 MCG tablet Take 125 mcg by mouth daily.     lidocaine-prilocaine (EMLA) cream Apply 1 application topically as needed. 30 g 0   midodrine (PROAMATINE) 10 MG tablet Take 1 tablet (10 mg total) by mouth 3 (three) times daily with meals. 90 tablet 1   nitroGLYCERIN (NITROSTAT) 0.4 MG SL tablet Place 1 tablet (0.4 mg total) under the tongue every 5 (five) minutes x 3 doses as needed for chest pain. 25 tablet 2   prochlorperazine (COMPAZINE) 10 MG tablet Take 1 tablet (10 mg total) by mouth every 6 (six) hours as needed for nausea or vomiting. 30 tablet  2   sucralfate (CARAFATE) 1 g tablet Take 1 tablet (1 g total) by mouth 4 (four) times daily -  with meals and at bedtime. 5 min before meals for radiation induced esophagitis 120 tablet 2   tamsulosin (FLOMAX) 0.4 MG CAPS capsule Take 0.4 mg by mouth daily.     TRELEGY ELLIPTA 100-62.5-25 MCG/INH AEPB Inhale 1 puff into the lungs daily.     No current facility-administered medications for this visit.   Facility-Administered Medications Ordered in Other Visits  Medication Dose Route Frequency Provider Last Rate Last Admin   heparin lock flush 100 unit/mL  500 Units Intracatheter Once PRN Curt Bears, MD       PEMEtrexed (ALIMTA) 900 mg in sodium chloride 0.9 % 100 mL chemo infusion  400 mg/m2 (Treatment Plan Recorded) Intravenous Once Curt Bears, MD 816 mL/hr at 06/16/21 1350 900 mg at 06/16/21 1350   prochlorperazine (COMPAZINE) 10 MG tablet            sodium chloride flush (NS) 0.9 %  injection 10 mL  10 mL Intracatheter PRN Curt Bears, MD        SURGICAL HISTORY:  Past Surgical History:  Procedure Laterality Date   AORTOGRAM  08/08/2009   for LLE claudication     By Dr. 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   BIOPSY N/A 05/30/2014   Procedure: BIOPSY;  Surgeon: [name], MD;  Location: [facility];  Service: Endoscopy;  Laterality: N/A;   BLADDER TUMOR EXCISION  8/812   CARDIAC CATHETERIZATION N/A 05/26/2016   Procedure: Left Heart Cath and Coronary Angiography;  Surgeon: [name], MD;  Location: [facility];  Service: Cardiovascular;  Laterality: N/A;   CARDIAC CATHETERIZATION N/A 05/26/2016   Procedure: Coronary Stent Intervention;  Surgeon: [name], MD;  Location: [facility];  Service: Cardiovascular;  Laterality: N/A;   COLONOSCOPY WITH PROPOFOL N/A 05/30/2014   CXK:GYJEHU colonic polyp-likely source of hematochezia-removed as described above   ESOPHAGOGASTRODUODENOSCOPY (EGD) WITH PROPOFOL N/A 05/30/2014   DJS:HFWYOV and bulbar erosions s/p gastric biopsy. No evidence of portal gastropathy on today's examination.   FEMORAL-TIBIAL BYPASS GRAFT  2009   Right side using non-reversed GSV   By Dr. Anson [place] BYPASS GRAFT  11/24/2007   Right femoral to anterior tibial BPG   by Dr. Oneida Alar   FINGER SURGERY Left    "straightened my pinky"   HAND TENDON SURGERY Left 2013   Left 5th finger   HERNIA REPAIR     "stomach"   ICD IMPLANT N/A 03/02/2018   Procedure: ICD IMPLANT;  Surgeon: [name], MD;  Location: [facility];  Service: Cardiovascular;  Laterality: N/A;   INCISIONAL HERNIA REPAIR N/A 12/25/2014   Procedure: HERNIA REPAIR INCISIONAL WITH MESH;  Surgeon: [name], MD;  Location: [facility];  Service: General;  Laterality: N/A;   INSERTION OF MESH N/A 12/25/2014   Procedure: INSERTION OF MESH;  Surgeon: [name], MD;  Location: [facility];  Service: General;  Laterality: N/A;   IR  IMAGING GUIDED PORT INSERTION  04/11/2018   LAPAROSCOPIC CHOLECYSTECTOMY     LOWER EXTREMITY ANGIOGRAM Left 12/30/2015   Procedure: Lower Extremity Angiogram;  Surgeon: [name], MD;  Location: [facility];  Service: Cardiovascular;  Laterality: Left;   PERIPHERAL VASCULAR CATHETERIZATION N/A 12/30/2015   Procedure: Abdominal Aortogram;  Surgeon: [name], MD;  Location: [facility];  Service: Cardiovascular;  Laterality: N/A;   POLYPECTOMY  05/30/2014   Procedure: POLYPECTOMY;  Surgeon: [name], MD;  Location: [facility];  Service: Endoscopy;;   TONSILLECTOMY AND ADENOIDECTOMY  ~ 1970   TYMPANOSTOMY TUBE PLACEMENT Bilateral ~ 1970    REVIEW OF SYSTEMS:   Review of Systems  Constitutional: Positive for fatigue and decreased appetite. Negative for chills, fever and unexpected weight change.  HENT: Negative for mouth sores, nosebleeds, sore throat and trouble swallowing.   Eyes: Negative for eye problems and icterus.  Respiratory: Positive for baseline cough and shortness of breath with exertion. Negative for hemoptysis.  Cardiovascular: Negative for chest pain and leg swelling.  Gastrointestinal: Negative for abdominal pain, constipation, diarrhea, nausea and vomiting.  Genitourinary: Negative for bladder incontinence, difficulty urinating, dysuria, frequency and hematuria.   Musculoskeletal: Negative for back pain, gait problem, neck pain and neck stiffness.  Skin: Negative for itching and rash. Positive for bruising on upper extremity bilaterally.  Neurological: Negative for dizziness, extremity weakness, gait problem, headaches, light-headedness and seizures.  Hematological: Positive for easy skin bruising. Negative for adenopathy.  Psychiatric/Behavioral: Positive for memory deficits at baseline. Negative for confusion, depression and sleep disturbance. The patient is not nervous/anxious.     PHYSICAL EXAMINATION:  Blood pressure 116/75, pulse 88, temperature  98.3 F (36.8 C), temperature source Tympanic, resp. rate 18, weight 187 lb 11.2 oz (85.1 kg), SpO2 98 %.  ECOG PERFORMANCE STATUS: 2  Physical Exam  Constitutional: Oriented to person, place, and time and chronically ill-appearing male and in no distress. HENT: Head: Normocephalic and atraumatic. Mouth/Throat: Oropharynx is clear and moist. No oropharyngeal exudate. Eyes: Conjunctivae are normal. Right eye exhibits no discharge. Left eye exhibits no discharge. No scleral icterus. Neck: Normal range of motion. Neck supple. Cardiovascular: Normal rate, regular rhythm, normal heart sounds and intact distal pulses.   Pulmonary/Chest:  Effort normal and clear to ascultation bilaterally. No respiratory distress. No rales. Abdominal: Soft. Bowel sounds are normal. Exhibits no distension and no mass. There is no tenderness.  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 Normal range of motion. Exhibits no edema.  Lymphadenopathy:    No cervical adenopathy.  Neurological: Alert and oriented to person, place, and time. Exhibits normal muscle tone. Examined in the wheelchair. BKA on right leg.  Skin: Bruising on bilateral upper extremities with some bleeding.  Skin is warm and dry. No rash noted. Not diaphoretic. No erythema. No pallor.  Psychiatric: Mood  judgment normal.  Positive for memory deficits. Vitals reviewed.  LABORATORY DATA: Lab Results  Component Value Date   WBC 5.3 06/16/2021   HGB 10.7 (L) 06/16/2021   HCT 32.2 (L) 06/16/2021   MCV 89.9 06/16/2021   PLT 151 06/16/2021      Chemistry      Component Value Date/Time   NA 136 06/16/2021 1107   NA 139 02/27/2018 1457   K 2.9 (L) 06/16/2021 1107   CL 96 (L) 06/16/2021 1107   CO2 27 06/16/2021 1107   BUN 8 06/16/2021 1107   BUN 7 02/27/2018 1457   CREATININE 0.84 06/16/2021 1107   CREATININE 1.13 06/28/2016 1453      Component Value Date/Time   CALCIUM 7.8 (L) 06/16/2021 1107   ALKPHOS 110 06/16/2021 1107   AST 37  06/16/2021 1107   ALT 49 (H) 06/16/2021 1107   BILITOT 0.5 06/16/2021 1107       RADIOGRAPHIC STUDIES:  CUP PACEART REMOTE DEVICE CHECK  Result Date: 06/04/2021 Scheduled remote reviewed.  Normal device function.  Optivol elevated sent to triage. Next remote 91 days. Kathy Breach, RN, CCDS, [employer]    ASSESSMENT/PLAN:  This is a very pleasant 60 year old Caucasian male with recurrent lung cancer initially diagnosed as stage IIIa non-small cell lung cancer, adenosquamous carcinoma.  He presented with a large right upper lobe lung mass with questionable chest wall invasion as well as a right hilar and mediastinal lymphadenopathy.  He was diagnosed in June 2019. His PDL1 expression is 99%   He completed 6 cycles of concurrent chemoradiation with carboplatin and paclitaxel.  He had a partial response to treatment.   The patient underwent consolidation immunotherapy with Imfinzi 10 mg/kg IV every 2 weeks.  He is status post 26 cycles.  He completed this on 07/04/2019   He was on observation until he showed evidence of local recurrence.  He then underwent immunotherapy with Keytruda 200 mg IV every 3 weeks. He is status post 9 cycles. He tolerated this well without any concerning complains. The patient has missed several appointments since November 2021. He only returned to the clinic once for treatment in that interval. Therefore, on his follow up imaging studies in March 2022, he had evidence of disease progession.  He restarted treatment at that time and continued to tolerate it well. Unfortunately, he had evidence for disease progression in May 2022 and this was discontinued.    The patient is currently undergoing treatment with systemic chemotherapy with carboplatin for an AUC of 5 and Alimta 500 mg/m IV every 3 weeks. He is status post 5 cycles.  His dose of carboplatin was reduced to AUC of 4 and his Alimta was reduced to 400 mg per metered squared due to intolerance and  myelosuppression. Dr. Julien Nordmann would like to do 5 cycles of carboplatin for an AUC of 4 and Alimta 400 mg/m2. He has been having a challenging time with pancytopenia recently. Starting from cycle #6, he will start maintenance single agent Alimta 400 mg/m2.   The patient often has pancytopenia with treatment which requires supportive care.  In the interval since his last appointment, he received 2 units of blood and 1 unit of platelets.  His insurance company unfortunately denied short-acting G-CSF injections.  The patient was seen with Dr. Julien Nordmann. Dr. Julien Nordmann will remove the carboplatin moving forward due to the pancytopenia. The patient denies any bleeding except for the easy bruising on his upper extremities. He had a few episodes of 1-2 drops of blood with constipation/bowel movements.   The patient will proceed with cycle #6 today with single agenet Alimta at 400 mg/m2.   Given his pancytopenia, we will monitor his labs closely every week until his next cycle of treatment. I will also arrange for a weekly sample to blood bank and arrange for a blood transfusion with 2 units of blood if his Hbg <8.   His potassium is low. I discussed the importance of taking his potassium supplements as hypokalemia can cause arrythmia and cardiac arrest. Given his history of cardiac arrest, I discussed that he needs to pick this up today and start taking his Kcl 1 tablet BID for the next 7 days. We will recheck his potassium next week. I also gave written instructions on his AVS today.   We will arrange for a restagnig CT scan of the chest, abdomen, and pelvis prior tot starting his next cycle of treatment.   We will see him back for a follow up visit in 3 weeks for  evaluation and to review his scan results.   Dr. Julien Nordmann may allow him a break in October to visit family if his scan and labs are acceptable. We will discuss at his next appointment.   I reviewed that he should take his folic acid daily to prevent  side effects of alimta. He is likely not taking folic acid and the decadron as prescribed considering he should be due for a refill. I will send a refill to his pharmacy.   I strongly encouraged the patient to get a life alert. He has an BKA. He has some falls frequently due to this.   The patient was advised to call immediately if he has any concerning symptoms in the interval. The patient voices understanding of current disease status and treatment options and is in agreement with the current care plan. All questions were answered. The patient knows to call the clinic with any problems, questions or concerns. We can certainly see the patient much sooner if necessary         Orders Placed This Encounter  Procedures   CT Chest W Contrast    Standing Status:   Future    Standing Expiration Date:   06/16/2022    Order Specific Question:   If indicated for the ordered procedure, I authorize the administration of contrast media per Radiology protocol    Answer:   Yes    Order Specific Question:   Preferred imaging location?    Answer:   [facility]   CT Abdomen Pelvis W Contrast    Standing Status:   Future    Standing Expiration Date:   06/16/2022    Order Specific Question:   If indicated for the ordered procedure, I authorize the administration of contrast media per Radiology protocol    Answer:   Yes    Order Specific Question:   Preferred imaging location?    Answer:   [facility]    Order Specific Question:   Is Oral Contrast requested for this exam?    Answer:   Yes, Per Radiology protocol   Sample to Blood Bank    Standing Status:   Standing    Number of Occurrences:   3    Standing Expiration Date:   06/16/2022       Mark Sos Shuaib Corsino, PA-C 06/16/21  ADDENDUM: Hematology/Oncology Attending: I had a face-to-face encounter with the patient today.  I reviewed his record, lab and recommended his care plan.  This is a very pleasant 60 years old  white male with metastatic non-small cell lung cancer that was initially diagnosed as a stage III in June 2019 with PD-L1 expression of 99%.  The patient underwent a course of concurrent chemoradiation followed by consolidation chemotherapy with Imfinzi every 2 weeks for a total of 26 cycles. Few months later he was found to have disease recurrence and the patient was started on single agent Keytruda status post 9 cycles but this was discontinued secondary to disease progression. The patient is currently on treatment with systemic chemotherapy with carboplatin and Alimta status post 5 cycles.  His dose of the carboplatin was reduced to AUC of 4 and Alimta to 400 Mg/M2 because of intolerance and significant pancytopenia.  The patient continues to have significant pancytopenia even with the reduced dose.  He required PRBCs transfusion as well as filgrastim injections. Is here today for evaluation before starting cycle #6. I recommended for the patient to proceed with the treatment but I will  discontinue carboplatin at this point because of the significant fatigue and pancytopenia. The patient will proceed with maintenance treatment with single agent Alimta at 400 Mg/M2 starting from cycle #6. He will come back for follow-up visit in 3 weeks for evaluation with repeat CT scan of the chest, abdomen and pelvis for restaging of his disease before starting cycle #7. The patient was advised to call immediately if he has any other concerning symptoms in the interval.  Disclaimer: This note was dictated with voice recognition software. Similar sounding words can inadvertently be transcribed and may be missed upon review. Eilleen Kempf, MD 06/16/21

## 2021-06-15 MED FILL — Dexamethasone Sodium Phosphate Inj 100 MG/10ML: INTRAMUSCULAR | Qty: 1 | Status: AC

## 2021-06-15 MED FILL — Fosaprepitant Dimeglumine For IV Infusion 150 MG (Base Eq): INTRAVENOUS | Qty: 5 | Status: AC

## 2021-06-16 ENCOUNTER — Other Ambulatory Visit: Payer: Self-pay

## 2021-06-16 ENCOUNTER — Inpatient Hospital Stay: Payer: Medicare Other

## 2021-06-16 ENCOUNTER — Inpatient Hospital Stay (HOSPITAL_BASED_OUTPATIENT_CLINIC_OR_DEPARTMENT_OTHER): Payer: Medicare Other | Admitting: Physician Assistant

## 2021-06-16 VITALS — BP 116/75 | HR 88 | Temp 98.3°F | Resp 18 | Wt 187.7 lb

## 2021-06-16 DIAGNOSIS — C3490 Malignant neoplasm of unspecified part of unspecified bronchus or lung: Secondary | ICD-10-CM | POA: Diagnosis not present

## 2021-06-16 DIAGNOSIS — E7800 Pure hypercholesterolemia, unspecified: Secondary | ICD-10-CM | POA: Diagnosis not present

## 2021-06-16 DIAGNOSIS — Z7952 Long term (current) use of systemic steroids: Secondary | ICD-10-CM | POA: Diagnosis not present

## 2021-06-16 DIAGNOSIS — I252 Old myocardial infarction: Secondary | ICD-10-CM | POA: Diagnosis not present

## 2021-06-16 DIAGNOSIS — Z86718 Personal history of other venous thrombosis and embolism: Secondary | ICD-10-CM | POA: Diagnosis not present

## 2021-06-16 DIAGNOSIS — Z7982 Long term (current) use of aspirin: Secondary | ICD-10-CM | POA: Diagnosis not present

## 2021-06-16 DIAGNOSIS — Z95828 Presence of other vascular implants and grafts: Secondary | ICD-10-CM

## 2021-06-16 DIAGNOSIS — T451X5A Adverse effect of antineoplastic and immunosuppressive drugs, initial encounter: Secondary | ICD-10-CM

## 2021-06-16 DIAGNOSIS — Z5111 Encounter for antineoplastic chemotherapy: Secondary | ICD-10-CM | POA: Diagnosis not present

## 2021-06-16 DIAGNOSIS — D61818 Other pancytopenia: Secondary | ICD-10-CM | POA: Diagnosis not present

## 2021-06-16 DIAGNOSIS — Z79899 Other long term (current) drug therapy: Secondary | ICD-10-CM | POA: Diagnosis not present

## 2021-06-16 DIAGNOSIS — E876 Hypokalemia: Secondary | ICD-10-CM | POA: Insufficient documentation

## 2021-06-16 DIAGNOSIS — D6481 Anemia due to antineoplastic chemotherapy: Secondary | ICD-10-CM

## 2021-06-16 DIAGNOSIS — C3491 Malignant neoplasm of unspecified part of right bronchus or lung: Secondary | ICD-10-CM

## 2021-06-16 DIAGNOSIS — F17210 Nicotine dependence, cigarettes, uncomplicated: Secondary | ICD-10-CM | POA: Diagnosis not present

## 2021-06-16 DIAGNOSIS — C3411 Malignant neoplasm of upper lobe, right bronchus or lung: Secondary | ICD-10-CM | POA: Diagnosis not present

## 2021-06-16 LAB — CBC WITH DIFFERENTIAL (CANCER CENTER ONLY)
Abs Immature Granulocytes: 0.37 K/uL — ABNORMAL HIGH (ref 0.00–0.07)
Basophils Absolute: 0 K/uL (ref 0.0–0.1)
Basophils Relative: 0 %
Eosinophils Absolute: 0 K/uL (ref 0.0–0.5)
Eosinophils Relative: 0 %
HCT: 32.2 % — ABNORMAL LOW (ref 39.0–52.0)
Hemoglobin: 10.7 g/dL — ABNORMAL LOW (ref 13.0–17.0)
Immature Granulocytes: 7 %
Lymphocytes Relative: 9 %
Lymphs Abs: 0.5 K/uL — ABNORMAL LOW (ref 0.7–4.0)
MCH: 29.9 pg (ref 26.0–34.0)
MCHC: 33.2 g/dL (ref 30.0–36.0)
MCV: 89.9 fL (ref 80.0–100.0)
Monocytes Absolute: 0.9 K/uL (ref 0.1–1.0)
Monocytes Relative: 17 %
Neutro Abs: 3.5 K/uL (ref 1.7–7.7)
Neutrophils Relative %: 67 %
Platelet Count: 151 K/uL (ref 150–400)
RBC: 3.58 MIL/uL — ABNORMAL LOW (ref 4.22–5.81)
RDW: 19.8 % — ABNORMAL HIGH (ref 11.5–15.5)
WBC Count: 5.3 K/uL (ref 4.0–10.5)
nRBC: 0 % (ref 0.0–0.2)

## 2021-06-16 LAB — CMP (CANCER CENTER ONLY)
ALT: 49 U/L — ABNORMAL HIGH (ref 0–44)
AST: 37 U/L (ref 15–41)
Albumin: 3.4 g/dL — ABNORMAL LOW (ref 3.5–5.0)
Alkaline Phosphatase: 110 U/L (ref 38–126)
Anion gap: 13 (ref 5–15)
BUN: 8 mg/dL (ref 6–20)
CO2: 27 mmol/L (ref 22–32)
Calcium: 7.8 mg/dL — ABNORMAL LOW (ref 8.9–10.3)
Chloride: 96 mmol/L — ABNORMAL LOW (ref 98–111)
Creatinine: 0.84 mg/dL (ref 0.61–1.24)
GFR, Estimated: >60 mL/min
Glucose, Bld: 127 mg/dL — ABNORMAL HIGH (ref 70–99)
Potassium: 2.9 mmol/L — ABNORMAL LOW (ref 3.5–5.1)
Sodium: 136 mmol/L (ref 135–145)
Total Bilirubin: 0.5 mg/dL (ref 0.3–1.2)
Total Protein: 6.7 g/dL (ref 6.5–8.1)

## 2021-06-16 LAB — SAMPLE TO BLOOD BANK

## 2021-06-16 MED ORDER — SODIUM CHLORIDE 0.9% FLUSH
10.0000 mL | INTRAVENOUS | Status: DC | PRN
Start: 2021-06-16 — End: 2021-06-16
  Administered 2021-06-16: 10 mL

## 2021-06-16 MED ORDER — SODIUM CHLORIDE 0.9 % IV SOLN
400.0000 mg/m2 | Freq: Once | INTRAVENOUS | Status: AC
  Administered 2021-06-16: 900 mg via INTRAVENOUS
  Filled 2021-06-16: qty 20

## 2021-06-16 MED ORDER — CYANOCOBALAMIN 1000 MCG/ML IJ SOLN
1000.0000 ug | Freq: Once | INTRAMUSCULAR | Status: AC
  Administered 2021-06-16: 1000 ug via INTRAMUSCULAR
  Filled 2021-06-16: qty 1

## 2021-06-16 MED ORDER — HEPARIN SOD (PORK) LOCK FLUSH 100 UNIT/ML IV SOLN
500.0000 [IU] | Freq: Once | INTRAVENOUS | Status: AC | PRN
  Administered 2021-06-16: 500 [IU]

## 2021-06-16 MED ORDER — PROCHLORPERAZINE MALEATE 10 MG PO TABS
ORAL_TABLET | ORAL | Status: AC
  Filled 2021-06-16: qty 1

## 2021-06-16 MED ORDER — SODIUM CHLORIDE 0.9 % IV SOLN: Freq: Once | INTRAVENOUS | Status: AC

## 2021-06-16 MED ORDER — POTASSIUM CHLORIDE CRYS ER 20 MEQ PO TBCR: 20.0000 meq | EXTENDED_RELEASE_TABLET | Freq: Two times a day (BID) | ORAL | 0 refills | Status: DC

## 2021-06-16 MED ORDER — PROCHLORPERAZINE EDISYLATE 10 MG/2ML IJ SOLN
10.0000 mg | Freq: Once | INTRAMUSCULAR | Status: AC
  Administered 2021-06-16: 10 mg via INTRAVENOUS
  Filled 2021-06-16: qty 2

## 2021-06-16 MED ORDER — DEXAMETHASONE 4 MG PO TABS
ORAL_TABLET | ORAL | 2 refills | Status: DC
Start: 2021-06-16 — End: 2024-02-21

## 2021-06-16 MED ORDER — SODIUM CHLORIDE 0.9% FLUSH
10.0000 mL | Freq: Once | INTRAVENOUS | Status: AC
  Administered 2021-06-16: 10 mL

## 2021-06-16 NOTE — Patient Instructions (Signed)
[facility]   Discharge Instructions: Thank you for choosing [facility] to provide your oncology and hematology care.   If you have a lab appointment with the [facility], please go directly to the [facility] and check in at the registration area.   Wear comfortable clothing and clothing appropriate for easy access to any Portacath or PICC line.   We strive to give you quality time with your provider. You may need to reschedule your appointment if you arrive late (15 or more minutes).  Arriving late affects you and other patients whose appointments are after yours.  Also, if you miss three or more appointments without notifying the office, you may be dismissed from the clinic at the provider's discretion.      For prescription refill requests, have your pharmacy contact our office and allow 72 hours for refills to be completed.    Today you received the following chemotherapy and/or immunotherapy agents: Pemetrexed (Alimta)      To help prevent nausea and vomiting after your treatment, we encourage you to take your nausea medication as directed.  BELOW ARE SYMPTOMS THAT SHOULD BE REPORTED IMMEDIATELY: *FEVER GREATER THAN 100.4 F (38 C) OR HIGHER *CHILLS OR SWEATING *NAUSEA AND VOMITING THAT IS NOT CONTROLLED WITH YOUR NAUSEA MEDICATION *UNUSUAL SHORTNESS OF BREATH *UNUSUAL BRUISING OR BLEEDING *URINARY PROBLEMS (pain or burning when urinating, or frequent urination) *BOWEL PROBLEMS (unusual diarrhea, constipation, pain near the anus) TENDERNESS IN MOUTH AND THROAT WITH OR WITHOUT PRESENCE OF ULCERS (sore throat, sores in mouth, or a toothache) UNUSUAL RASH, SWELLING OR PAIN  UNUSUAL VAGINAL DISCHARGE OR ITCHING   Items with * indicate a potential emergency and should be followed up as soon as possible or go to the Emergency Department if any problems should occur.  Please show the CHEMOTHERAPY ALERT CARD or IMMUNOTHERAPY ALERT CARD at  check-in to the Emergency Department and triage nurse.  Should you have questions after your visit or need to cancel or reschedule your appointment, please contact [facility]  Dept: [phone]  and follow the prompts.  Office hours are 8:00 a.m. to 4:30 p.m. Monday - Friday. Please note that voicemails left after 4:00 p.m. may not be returned until the following business day.  We are closed weekends and major holidays. You have access to a nurse at all times for urgent questions. Please call the main number to the clinic Dept: [phone] and follow the prompts.   For any non-urgent questions, you may also contact your provider using MyChart. We now offer e-Visits for anyone 50 and older to request care online for non-urgent symptoms. For details visit [email or web address].   Also download the MyChart app! Go to the app store, search "MyChart", open the app, select [facility], and log in with your MyChart username and password.  Due to Covid, a mask is required upon entering the hospital/clinic. If you do not have a mask, one will be given to you upon arrival. For doctor visits, patients may have 1 support person aged 53 or older with them. For treatment visits, patients cannot have anyone with them due to current Covid guidelines and our immunocompromised population.

## 2021-06-16 NOTE — Patient Instructions (Addendum)
-  I sent a potassium to your pharmacy. Potassium is important because it helps your heart beat. Please make sure that you pick this up from the pharmacy and take this twice a day as prescribed. If you are having a hard time swallowing these (because they are large pills), you can crush them and put them in apple sauce.  -Your blood counts have been getting low with them two chemotherapy drugs you have been on. Dr. Julien Nordmann is going to remove one of the chemotherapy drugs. From now on, you will just be on one chemotherapy medication. This means your infusions will be a lot shorter.  -However, we do need to keep a close eye on your blood work weekly since they have gotten so low recently.  -We will also arrange for another CT scan of the chest, abdomen, and pelvis a few days before your next treatment in 3 weeks. I would like you to get the scan on 07/03/21 or so. Please be on the look out for a phone call from the radiology (scan) department. If you need to call them, their number is [phone].  -When we see you again in 3 weeks, we will review your scan and your blood work and we can talk about taking a break at that time and seeing your parents in [place] based on the blood work and scan results.

## 2021-06-22 ENCOUNTER — Other Ambulatory Visit: Payer: Self-pay

## 2021-06-22 DIAGNOSIS — C3491 Malignant neoplasm of unspecified part of right bronchus or lung: Secondary | ICD-10-CM

## 2021-06-23 ENCOUNTER — Inpatient Hospital Stay: Payer: Medicare Other

## 2021-06-30 ENCOUNTER — Other Ambulatory Visit: Payer: Self-pay

## 2021-06-30 ENCOUNTER — Inpatient Hospital Stay: Payer: Medicare Other | Attending: Physician Assistant

## 2021-06-30 DIAGNOSIS — Z95828 Presence of other vascular implants and grafts: Secondary | ICD-10-CM

## 2021-06-30 DIAGNOSIS — C3411 Malignant neoplasm of upper lobe, right bronchus or lung: Secondary | ICD-10-CM | POA: Insufficient documentation

## 2021-06-30 DIAGNOSIS — Z923 Personal history of irradiation: Secondary | ICD-10-CM | POA: Insufficient documentation

## 2021-06-30 DIAGNOSIS — F17210 Nicotine dependence, cigarettes, uncomplicated: Secondary | ICD-10-CM | POA: Diagnosis not present

## 2021-06-30 DIAGNOSIS — Z7982 Long term (current) use of aspirin: Secondary | ICD-10-CM | POA: Insufficient documentation

## 2021-06-30 DIAGNOSIS — Z7952 Long term (current) use of systemic steroids: Secondary | ICD-10-CM | POA: Insufficient documentation

## 2021-06-30 DIAGNOSIS — Z5112 Encounter for antineoplastic immunotherapy: Secondary | ICD-10-CM | POA: Diagnosis present

## 2021-06-30 DIAGNOSIS — D6481 Anemia due to antineoplastic chemotherapy: Secondary | ICD-10-CM

## 2021-06-30 DIAGNOSIS — Z86718 Personal history of other venous thrombosis and embolism: Secondary | ICD-10-CM | POA: Insufficient documentation

## 2021-06-30 DIAGNOSIS — Z79899 Other long term (current) drug therapy: Secondary | ICD-10-CM | POA: Insufficient documentation

## 2021-06-30 DIAGNOSIS — C3491 Malignant neoplasm of unspecified part of right bronchus or lung: Secondary | ICD-10-CM

## 2021-06-30 DIAGNOSIS — Z5111 Encounter for antineoplastic chemotherapy: Secondary | ICD-10-CM | POA: Diagnosis not present

## 2021-06-30 LAB — CBC WITH DIFFERENTIAL (CANCER CENTER ONLY)
Abs Immature Granulocytes: 0.04 10*3/uL (ref 0.00–0.07)
Basophils Absolute: 0 10*3/uL (ref 0.0–0.1)
Basophils Relative: 0 %
Eosinophils Absolute: 0 10*3/uL (ref 0.0–0.5)
Eosinophils Relative: 1 %
HCT: 29.2 % — ABNORMAL LOW (ref 39.0–52.0)
Hemoglobin: 9.6 g/dL — ABNORMAL LOW (ref 13.0–17.0)
Immature Granulocytes: 1 %
Lymphocytes Relative: 11 %
Lymphs Abs: 0.5 10*3/uL — ABNORMAL LOW (ref 0.7–4.0)
MCH: 30.9 pg (ref 26.0–34.0)
MCHC: 32.9 g/dL (ref 30.0–36.0)
MCV: 93.9 fL (ref 80.0–100.0)
Monocytes Absolute: 0.8 10*3/uL (ref 0.1–1.0)
Monocytes Relative: 19 %
Neutro Abs: 2.8 10*3/uL (ref 1.7–7.7)
Neutrophils Relative %: 68 %
Platelet Count: 101 10*3/uL — ABNORMAL LOW (ref 150–400)
RBC: 3.11 MIL/uL — ABNORMAL LOW (ref 4.22–5.81)
RDW: 21 % — ABNORMAL HIGH (ref 11.5–15.5)
WBC Count: 4.1 10*3/uL (ref 4.0–10.5)
nRBC: 0 % (ref 0.0–0.2)

## 2021-06-30 LAB — CMP (CANCER CENTER ONLY)
ALT: 54 U/L — ABNORMAL HIGH (ref 0–44)
AST: 41 U/L (ref 15–41)
Albumin: 3.4 g/dL — ABNORMAL LOW (ref 3.5–5.0)
Alkaline Phosphatase: 129 U/L — ABNORMAL HIGH (ref 38–126)
Anion gap: 11 (ref 5–15)
BUN: 6 mg/dL (ref 6–20)
CO2: 28 mmol/L (ref 22–32)
Calcium: 9.1 mg/dL (ref 8.9–10.3)
Chloride: 99 mmol/L (ref 98–111)
Creatinine: 0.94 mg/dL (ref 0.61–1.24)
GFR, Estimated: >60 mL/min (ref >60)
Glucose, Bld: 109 mg/dL — ABNORMAL HIGH (ref 70–99)
Potassium: 3.7 mmol/L (ref 3.5–5.1)
Sodium: 138 mmol/L (ref 135–145)
Total Bilirubin: 0.6 mg/dL (ref 0.3–1.2)
Total Protein: 6.8 g/dL (ref 6.5–8.1)

## 2021-06-30 LAB — SAMPLE TO BLOOD BANK

## 2021-06-30 MED ORDER — SODIUM CHLORIDE 0.9% FLUSH: 10.0000 mL | Freq: Once | INTRAVENOUS | Status: DC

## 2021-06-30 MED ORDER — HEPARIN SOD (PORK) LOCK FLUSH 100 UNIT/ML IV SOLN: 500.0000 [IU] | Freq: Once | INTRAVENOUS | Status: DC

## 2021-07-02 DIAGNOSIS — C3490 Malignant neoplasm of unspecified part of unspecified bronchus or lung: Secondary | ICD-10-CM | POA: Diagnosis not present

## 2021-07-02 DIAGNOSIS — G546 Phantom limb syndrome with pain: Secondary | ICD-10-CM | POA: Diagnosis not present

## 2021-07-02 DIAGNOSIS — G894 Chronic pain syndrome: Secondary | ICD-10-CM | POA: Diagnosis not present

## 2021-07-03 ENCOUNTER — Ambulatory Visit (HOSPITAL_COMMUNITY): Payer: Medicare Other | Attending: Physician Assistant

## 2021-07-06 ENCOUNTER — Telehealth: Payer: Self-pay | Admitting: Physician Assistant

## 2021-07-06 NOTE — Telephone Encounter (Signed)
I received a message that he missed his CT scan on 07/03/21. Attempted to call him and his wife to give instruction to reschedule his CT scan. Unable to reach them on multiple attempts. A detailed voicemail was left how to reschedule his scan.

## 2021-07-07 ENCOUNTER — Inpatient Hospital Stay: Payer: Medicare Other

## 2021-07-07 ENCOUNTER — Inpatient Hospital Stay: Payer: Medicare Other | Admitting: Internal Medicine

## 2021-07-07 ENCOUNTER — Telehealth: Payer: Self-pay | Admitting: Physician Assistant

## 2021-07-07 NOTE — Telephone Encounter (Signed)
The patient no showed his infusion today. Apparently they are in [place] because he has had 3 deaths in the family. I will send a scheduling message to scheduling to reschedule him for next week. I re-discussed that upon returning home, he needs to call radiology scheduling to reschedule his missed CT scan which was supposed to be on 10/7 (no showed the appointment). They have the phone number for radiology scheduling at home. I reviewed this in great detail. Him and his wife mention that he has had two seizures since discontinuing his seizure medication. He was diagnosed with epilepsy and his seizure medication/management is not prescribed by our office. He believes this was discontinued while admitted to the hospital a few months ago. I advised him to direct his call regarding if he needs a refill to his neurologist.

## 2021-07-13 ENCOUNTER — Telehealth: Payer: Self-pay | Admitting: Physician Assistant

## 2021-07-13 ENCOUNTER — Inpatient Hospital Stay: Payer: Medicare Other | Admitting: Physician Assistant

## 2021-07-13 ENCOUNTER — Inpatient Hospital Stay: Payer: Medicare Other

## 2021-07-13 ENCOUNTER — Telehealth: Payer: Self-pay | Admitting: *Deleted

## 2021-07-13 NOTE — Telephone Encounter (Signed)
Called pt to f/u about missed appointments for today. Left vm to call office to reschedule.

## 2021-07-13 NOTE — Telephone Encounter (Signed)
The patient did not show up for his appointment today.  I personally spoke to the patient and his wife last week due to his missed CT scan and follow up appointment. They stated that they were in [place] due to a death in the family.  They told me they would be back into town by this week.  I called them about his missed appointment today to see if they were still out of state or not.  I left a voicemail to call us back and speak to the scheduling department to reschedule his missed appointment today.  I also once again reminded them on how to schedule his restaging CT scan on the voicemail. Call back number left.

## 2021-07-13 NOTE — Progress Notes (Signed)
[facility] OFFICE PROGRESS NOTE  Redmond School, MD [address] [place] [address] DIAGNOSIS: Recurrent lung cancer initially diagnosed as stage IIIA (T3, N2, M0) non-small cell lung cancer, adenosquamous carcinoma diagnosed in June 2019 and presented with large right upper lobe lung mass with questionable chest wall invasion as well as right hilar and mediastinal lymphadenopathy.   Biomarker Findings Tumor Mutational Burden - TMB-High (21 Muts/Mb) Microsatellite status - MS-Stable Genomic Findings For a complete list of the genes assayed, please refer to the Appendix. ATM A7681* CCND2 P281R KRAS G12V CHEK2 T354fs*15 TP53 E298* 7 Disease relevant genes with no reportable alterations: ALK, EGFR, BRAF, MET, RET, ERBB2, ROS1   PDL1 Expression: 99%  PRIOR THERAPY:  1) Concurrent chemoradiation with chemotherapy consisting of weekly carboplatin for an AUC of 2 and paclitaxel 45 mg/m2.  First dose given on 04/03/2018.  Status post 6 cycles.  Last dose was giving 05/15/2018 with partial response. 2) Consolidation treatment with immunotherapy with Imfinzi (Durvalumab) 10 mg/KG every 2 weeks.  First dose 06/20/2018.  Status post 26 cycles. 3) Systemic treatment with immunotherapy with Keytruda 200 mg IV every 3 weeks.  First dose February 27, 2020. Status post 12 cycles. Discontinued due to disease progression.  CURRENT THERAPY: Carboplatin for an AUC of 5 and Alimta 500 mg per metered squared on days 1 IV every 3 weeks.  First dose expected on 03/03/2021. Status post 6 cycle. His dose of carboplatin was reduced to an AUC of 4 and Alimta reduced to 400 mg/m2 due to intolerance. Dr. Julien Nordmann would like to do 5 cycles of carboplatin and Alimta. Starting from cycle #6, we will start him on maintenance Alimta 400 mg/m2.   INTERVAL HISTORY: [name] 60 y.o. male returns to the clinic today for a follow-up visit. The patient is feeling fair without any concerning complaints. He was  supposed to follow up on 07/07/21 for cycle #7 with a restaging CT scan; however, he was out of town in [place] due 3 deaths in his family with his uncle, aunt, and cousin. He became emotional during his encounter today and asked to speak with a chaplin. His CT scan has been rescheduled for 07/23/21.  The patient is currently undergoing dose reduced chemotherapy with carboplatin for an AUC of 4 and Alimta 400 mg per metered square.  Despite this, the patient often has pancytopenia which required supportive care such as blood transfusions. His insurance had denied short acting G-CSF. Besides the baseline senile purpura, he denies abnormal bleeding. He was given stool cards which were no performed. Since he has been off treatment for several weeks, his Hbg has improved today.   He denies any recent signs and symptoms of infection including fevers, chills, sore throat, worsening cough, congestion, skin infections, diarrhea, or dysuria. He is not sure if he is taking his folic acid or decadron premedications. Of note, he has some memory deficits at baseline. The patient denies any night sweats. He has been having some difficulties with weight loss and has previously been seen by member the nutritionist team. His weight is stable. He notes he has had some nausea with certain smells. He sometimes has constipation for which he takes a over-the-counter constipation medication but he is not sure which one.  He denies any chest pain or hemoptysis.  He reports his baseline dyspnea on exertion and chronic cough for which he unfortunately continues to smoke cigarettes. Due to his life stressors, he has been smoking more.  He  denies any headache. He notes some worsening blurry vision. He is here today for evaluation and repeat blood work before starting cycle #7    MEDICAL HISTORY: Past Medical History:  Diagnosis Date   Acute hepatitis C virus infection 06/19/2007   Qualifier: Diagnosis of  By: Leilani Merl CMA, Tiffany     Acute  ST elevation myocardial infarction (STEMI) involving left anterior descending (LAD) coronary artery ([facility]) 05/26/2016   Acute ST elevation myocardial infarction (STEMI) of lateral wall (HCC) 05/26/2016   AICD (automatic cardioverter/defibrillator) present 03/02/2018   Anxiety    Asthma    Atherosclerosis of native arteries of the extremities with intermittent claudication 12/16/2011   Chronic hepatitis C without hepatic coma ([facility]) 05/03/2014   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/2018   FEV1 57%   Depression    DVT (deep venous thrombosis) ([facility]) 2009; 2019   RLE; LLE   Encounter for antineoplastic chemotherapy 03/22/2018   Hepatitis C    "tx'd in 2015"   High cholesterol    Ischemic cardiomyopathy 03/02/2018   Non-small cell carcinoma of right lung, stage 3 ([facility]) 03/22/2018   PAD (peripheral artery disease) ([facility]) 01/25/2013   Peripheral vascular disease, unspecified 07/20/2012   Port-A-Cath in place 04/24/2018   ST elevation myocardial infarction involving left anterior descending (LAD) coronary artery ([facility])    STEMI (ST elevation myocardial infarction) ([facility]) 05/26/2016   Tobacco abuse 01/28/2016   Tubular adenoma of colon 07/11/2014   Urinary dribbling     ALLERGIES:  is allergic to lyrica [pregabalin] and neurontin [gabapentin].  MEDICATIONS:  Current Outpatient Medications  Medication Sig Dispense Refill   albuterol (VENTOLIN HFA) 108 (90 Base) MCG/ACT inhaler Inhale 1-2 puffs into the lungs every 4 (four) hours as needed for wheezing or shortness of breath.     alprazolam (XANAX) 2 MG tablet Take 2 mg by mouth 4 (four) times daily as needed for anxiety.     amitriptyline (ELAVIL) 100 MG tablet Take 100 mg by mouth at bedtime.      aspirin 81 MG EC tablet Take 81 mg by mouth daily.     citalopram (CELEXA) 40 MG tablet Take 40 mg by mouth daily.  11   dexamethasone (DECADRON) 4 MG tablet Please take 1 tablet twice a day the day before, the day of, and the day after treatment  40 tablet 2   folic acid (FOLVITE) 1 MG tablet Take 1 tablet (1 mg total) by mouth daily. 30 tablet 2   HYDROcodone-acetaminophen (NORCO) 10-325 MG tablet Take 1 tablet by mouth every 4 (four) hours as needed for moderate pain.   0   levothyroxine (SYNTHROID) 125 MCG tablet Take 125 mcg by mouth daily.     lidocaine-prilocaine (EMLA) cream Apply 1 application topically as needed. 30 g 0   midodrine (PROAMATINE) 10 MG tablet Take 1 tablet (10 mg total) by mouth 3 (three) times daily with meals. 90 tablet 1   nitroGLYCERIN (NITROSTAT) 0.4 MG SL tablet Place 1 tablet (0.4 mg total) under the tongue every 5 (five) minutes x 3 doses as needed for chest pain. 25 tablet 2   potassium chloride SA (KLOR-CON) 20 MEQ tablet Take 1 tablet (20 mEq total) by mouth 2 (two) times daily. 14 tablet 0   prochlorperazine (COMPAZINE) 10 MG tablet Take 1 tablet (10 mg total) by mouth every 6 (six) hours as needed for nausea or vomiting. 30 tablet 2   sucralfate (CARAFATE) 1 g tablet Take 1 tablet (1 g  total) by mouth 4 (four) times daily -  with meals and at bedtime. 5 min before meals for radiation induced esophagitis 120 tablet 2   tamsulosin (FLOMAX) 0.4 MG CAPS capsule Take 0.4 mg by mouth daily.     TRELEGY ELLIPTA 100-62.5-25 MCG/INH AEPB Inhale 1 puff into the lungs daily.     No current facility-administered medications for this visit.   Facility-Administered Medications Ordered in Other Visits  Medication Dose Route Frequency Provider Last Rate Last Admin   heparin lock flush 100 unit/mL  500 Units Intracatheter Once PRN Curt Bears, MD       PEMEtrexed (ALIMTA) 900 mg in sodium chloride 0.9 % 100 mL chemo infusion  400 mg/m2 (Treatment Plan Recorded) Intravenous Once Curt Bears, MD 816 mL/hr at 07/20/21 1619 900 mg at 07/20/21 1619   sodium chloride flush (NS) 0.9 % injection 10 mL  10 mL Intracatheter PRN Curt Bears, MD        SURGICAL HISTORY:  Past Surgical History:  Procedure  Laterality Date   AORTOGRAM  08/08/2009   for LLE claudication     By Dr. 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   BIOPSY N/A 05/30/2014   Procedure: BIOPSY;  Surgeon: [name], MD;  Location: [facility];  Service: Endoscopy;  Laterality: N/A;   BLADDER TUMOR EXCISION  8/812   CARDIAC CATHETERIZATION N/A 05/26/2016   Procedure: Left Heart Cath and Coronary Angiography;  Surgeon: [name], MD;  Location: [facility];  Service: Cardiovascular;  Laterality: N/A;   CARDIAC CATHETERIZATION N/A 05/26/2016   Procedure: Coronary Stent Intervention;  Surgeon: [name], MD;  Location: [facility];  Service: Cardiovascular;  Laterality: N/A;   COLONOSCOPY WITH PROPOFOL N/A 05/30/2014   OFB:PZWCHE colonic polyp-likely source of hematochezia-removed as described above   ESOPHAGOGASTRODUODENOSCOPY (EGD) WITH PROPOFOL N/A 05/30/2014   NID:POEUMP and bulbar erosions s/p gastric biopsy. No evidence of portal gastropathy on today's examination.   FEMORAL-TIBIAL BYPASS GRAFT  2009   Right side using non-reversed GSV   By Dr. Anson [place] BYPASS GRAFT  11/24/2007   Right femoral to anterior tibial BPG   by Dr. Oneida Alar   FINGER SURGERY Left    "straightened my pinky"   HAND TENDON SURGERY Left 2013   Left 5th finger   HERNIA REPAIR     "stomach"   ICD IMPLANT N/A 03/02/2018   Procedure: ICD IMPLANT;  Surgeon: [name], MD;  Location: [facility];  Service: Cardiovascular;  Laterality: N/A;   INCISIONAL HERNIA REPAIR N/A 12/25/2014   Procedure: HERNIA REPAIR INCISIONAL WITH MESH;  Surgeon: [name], MD;  Location: [facility];  Service: General;  Laterality: N/A;   INSERTION OF MESH N/A 12/25/2014   Procedure: INSERTION OF MESH;  Surgeon: [name], MD;  Location: [facility];  Service: General;  Laterality: N/A;   IR IMAGING GUIDED PORT INSERTION  04/11/2018   LAPAROSCOPIC CHOLECYSTECTOMY     LOWER EXTREMITY ANGIOGRAM Left 12/30/2015   Procedure:  Lower Extremity Angiogram;  Surgeon: [name], MD;  Location: [facility];  Service: Cardiovascular;  Laterality: Left;   PERIPHERAL VASCULAR CATHETERIZATION N/A 12/30/2015   Procedure: Abdominal Aortogram;  Surgeon: [name], MD;  Location: [facility];  Service: Cardiovascular;  Laterality: N/A;   POLYPECTOMY  05/30/2014   Procedure: POLYPECTOMY;  Surgeon: [name], MD;  Location: [facility];  Service: Endoscopy;;  TONSILLECTOMY AND ADENOIDECTOMY  ~ 1970   TYMPANOSTOMY TUBE PLACEMENT Bilateral ~ 1970    REVIEW OF SYSTEMS:   Review of Systems  Constitutional: Positive for fatigue and decreased appetite. Negative for chills, fever and unexpected weight change.  HENT: Negative for mouth sores, nosebleeds, sore throat and trouble swallowing.   Eyes: Negative for eye problems and icterus.  Respiratory: Positive for baseline cough and shortness of breath with exertion. Negative for hemoptysis.  Cardiovascular: Negative for chest pain and leg swelling.  Gastrointestinal: Positive for constipation. Negative for abdominal pain, diarrhea, nausea and vomiting.  Genitourinary: Negative for bladder incontinence, difficulty urinating, dysuria, frequency and hematuria.   Musculoskeletal: Negative for back pain, gait problem, neck pain and neck stiffness.  Skin: Negative for itching and rash. Positive for bruising on upper extremity bilaterally.  Neurological: Negative for dizziness, extremity weakness, gait problem, headaches, light-headedness and seizures.  Hematological: Positive for easy skin bruising. Negative for adenopathy.  Psychiatric/Behavioral: Positive for memory deficits at baseline. Positive for depressed mood today. Negative for confusion, depression and sleep disturbance. The patient is not nervous/anxious.     PHYSICAL EXAMINATION:  Blood pressure 106/65, pulse (!) 102, temperature 98.2 F (36.8 C), temperature source Tympanic, resp. rate 18, SpO2 98  %.  ECOG PERFORMANCE STATUS: 2-3  Physical Exam  Constitutional: Oriented to person, place, and time and chronically ill-appearing male and in no distress. HENT: Head: Normocephalic and atraumatic. Mouth/Throat: Oropharynx is clear and moist. No oropharyngeal exudate. Eyes: Conjunctivae are normal. Right eye exhibits no discharge. Left eye exhibits no discharge. No scleral icterus. Neck: Normal range of motion. Neck supple. Cardiovascular: Normal rate, regular rhythm, normal heart sounds and intact distal pulses.   Pulmonary/Chest:  Effort normal and clear to ascultation bilaterally. No respiratory distress. No rales. Abdominal: Soft. Bowel sounds are normal. Exhibits no distension and no mass. There is no tenderness.  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 Normal range of motion. Exhibits no edema.  Lymphadenopathy:    No cervical adenopathy.  Neurological: Alert and oriented to person, place, and time. Exhibits normal muscle tone. Examined in the wheelchair. BKA on right leg.  Skin: Bruising on bilateral upper extremities with some bleeding.  Skin is warm and dry. No rash noted. Not diaphoretic. No erythema. No pallor.  Psychiatric: Mood  judgment normal. Became tearful today. Positive for memory deficits. Vitals reviewed.  LABORATORY DATA: Lab Results  Component Value Date   WBC 9.0 07/20/2021   HGB 11.5 (L) 07/20/2021   HCT 35.3 (L) 07/20/2021   MCV 97.0 07/20/2021   PLT 170 07/20/2021      Chemistry      Component Value Date/Time   NA 137 07/20/2021 1419   NA 139 02/27/2018 1457   K 3.8 07/20/2021 1419   CL 98 07/20/2021 1419   CO2 28 07/20/2021 1419   BUN 10 07/20/2021 1419   BUN 7 02/27/2018 1457   CREATININE 0.86 07/20/2021 1419   CREATININE 1.13 06/28/2016 1453      Component Value Date/Time   CALCIUM 9.3 07/20/2021 1419   ALKPHOS 117 07/20/2021 1419   AST 22 07/20/2021 1419   ALT 17 07/20/2021 1419   BILITOT 0.8 07/20/2021 1419       RADIOGRAPHIC  STUDIES:  No results found.   ASSESSMENT/PLAN:  This is a very pleasant 60 year old Caucasian male with recurrent lung cancer initially diagnosed as stage IIIa non-small cell lung cancer, adenosquamous carcinoma.  He presented with a large right upper lobe lung mass with  questionable chest wall invasion as well as a right hilar and mediastinal lymphadenopathy.  He was diagnosed in June 2019. His PDL1 expression is 99%   He completed 6 cycles of concurrent chemoradiation with carboplatin and paclitaxel.  He had a partial response to treatment.   The patient underwent consolidation immunotherapy with Imfinzi 10 mg/kg IV every 2 weeks.  He is status post 26 cycles.  He completed this on 07/04/2019  He was on observation until he showed evidence of local recurrence.   He then underwent immunotherapy with Keytruda 200 mg IV every 3 weeks. He is status post 9 cycles. He tolerated this well without any concerning complains. The patient has missed several appointments since November 2021. He only returned to the clinic once for treatment in that interval. Therefore, on his follow up imaging studies in March 2022, he had evidence of disease progession.  He restarted treatment at that time and continued to tolerate it well. Unfortunately, he had evidence for disease progression in May 2022 and this was discontinued.   The patient is currently undergoing treatment with systemic chemotherapy with carboplatin for an AUC of 5 and Alimta 500 mg/m IV every 3 weeks. He is status post 5 cycles.  His dose of carboplatin was reduced to AUC of 4 and his Alimta was reduced to 400 mg per metered squared due to intolerance and myelosuppression. Dr. Julien Nordmann would like to do 5 cycles of carboplatin for an AUC of 4 and Alimta 400 mg/m2. He has been having a challenging time with pancytopenia recently. Starting from cycle #6, he started maintenance single agent Alimta 400 mg/m2.   The patient often has pancytopenia with  treatment which requires supportive care.  His insurance company unfortunately denied short-acting G-CSF injections.   He was supposed to have a restaging CT scan prior to his appointment today; however, he was out of town in [place] due to deaths in the family.   Labs are reviewed.  The patient's labs have improved today as he is been off treatment for several weeks.  He is only undergoing dose reduced Alimta today at 400 mg per metered square.  His labs are acceptable to proceed with treatment as planned.  I reviewed the patient's premedications with him.  Reviewed that he should be taking folic acid daily as well as his Decadron premedication.  I have circled these on his medication list and asked that he return home to ensure that he has been taking these as prescribed.  Patient became tearful after his encounter today due to all the deaths in the family.  He is asked to speak to a chaplain.  I have reached out to the chaplain who will connect with the patient today.   He was strongly encouraged to keep the CT scan as scheduled on 07/23/2021.  Of course, if there is any concerning findings for disease progression, I will call the patient sooner to arrange for follow-up appointment.   We will see him back for follow-up visit in 3 weeks for evaluation and repeat blood work before starting cycle number 8.   The patient was advised to call immediately if he has any concerning symptoms in the interval. The patient voices understanding of current disease status and treatment options and is in agreement with the current care plan. All questions were answered. The patient knows to call the clinic with any problems, questions or concerns. We can certainly see the patient much sooner if necessary    No orders of  the defined types were placed in this encounter.    The total time spent in the appointment was 20-29 minutes.   Analissa Bayless L Kinnick Maus, PA-C 07/20/21

## 2021-07-14 ENCOUNTER — Other Ambulatory Visit: Payer: Medicare Other

## 2021-07-20 ENCOUNTER — Inpatient Hospital Stay: Payer: Medicare Other

## 2021-07-20 ENCOUNTER — Inpatient Hospital Stay (HOSPITAL_BASED_OUTPATIENT_CLINIC_OR_DEPARTMENT_OTHER): Payer: Medicare Other | Admitting: Physician Assistant

## 2021-07-20 ENCOUNTER — Other Ambulatory Visit: Payer: Self-pay

## 2021-07-20 ENCOUNTER — Encounter: Payer: Self-pay | Admitting: General Practice

## 2021-07-20 VITALS — BP 106/65 | HR 102 | Temp 98.2°F | Resp 18

## 2021-07-20 VITALS — HR 100

## 2021-07-20 DIAGNOSIS — C3491 Malignant neoplasm of unspecified part of right bronchus or lung: Secondary | ICD-10-CM

## 2021-07-20 DIAGNOSIS — C3490 Malignant neoplasm of unspecified part of unspecified bronchus or lung: Secondary | ICD-10-CM

## 2021-07-20 DIAGNOSIS — F17210 Nicotine dependence, cigarettes, uncomplicated: Secondary | ICD-10-CM | POA: Diagnosis not present

## 2021-07-20 DIAGNOSIS — Z5111 Encounter for antineoplastic chemotherapy: Secondary | ICD-10-CM

## 2021-07-20 DIAGNOSIS — Z86718 Personal history of other venous thrombosis and embolism: Secondary | ICD-10-CM | POA: Diagnosis not present

## 2021-07-20 DIAGNOSIS — Z7982 Long term (current) use of aspirin: Secondary | ICD-10-CM | POA: Diagnosis not present

## 2021-07-20 DIAGNOSIS — Z79899 Other long term (current) drug therapy: Secondary | ICD-10-CM | POA: Diagnosis not present

## 2021-07-20 DIAGNOSIS — Z923 Personal history of irradiation: Secondary | ICD-10-CM | POA: Diagnosis not present

## 2021-07-20 DIAGNOSIS — Z95828 Presence of other vascular implants and grafts: Secondary | ICD-10-CM

## 2021-07-20 DIAGNOSIS — Z7952 Long term (current) use of systemic steroids: Secondary | ICD-10-CM | POA: Diagnosis not present

## 2021-07-20 DIAGNOSIS — C3411 Malignant neoplasm of upper lobe, right bronchus or lung: Secondary | ICD-10-CM | POA: Diagnosis not present

## 2021-07-20 LAB — CBC WITH DIFFERENTIAL (CANCER CENTER ONLY)
Abs Immature Granulocytes: 0.08 10*3/uL — ABNORMAL HIGH (ref 0.00–0.07)
Basophils Absolute: 0 10*3/uL (ref 0.0–0.1)
Basophils Relative: 0 %
Eosinophils Absolute: 0 10*3/uL (ref 0.0–0.5)
Eosinophils Relative: 0 %
HCT: 35.3 % — ABNORMAL LOW (ref 39.0–52.0)
Hemoglobin: 11.5 g/dL — ABNORMAL LOW (ref 13.0–17.0)
Immature Granulocytes: 1 %
Lymphocytes Relative: 4 %
Lymphs Abs: 0.4 10*3/uL — ABNORMAL LOW (ref 0.7–4.0)
MCH: 31.6 pg (ref 26.0–34.0)
MCHC: 32.6 g/dL (ref 30.0–36.0)
MCV: 97 fL (ref 80.0–100.0)
Monocytes Absolute: 0.1 10*3/uL (ref 0.1–1.0)
Monocytes Relative: 1 %
Neutro Abs: 8.4 10*3/uL — ABNORMAL HIGH (ref 1.7–7.7)
Neutrophils Relative %: 94 %
Platelet Count: 170 10*3/uL (ref 150–400)
RBC: 3.64 MIL/uL — ABNORMAL LOW (ref 4.22–5.81)
RDW: 19.7 % — ABNORMAL HIGH (ref 11.5–15.5)
WBC Count: 9 10*3/uL (ref 4.0–10.5)
nRBC: 0 % (ref 0.0–0.2)

## 2021-07-20 LAB — CMP (CANCER CENTER ONLY)
ALT: 17 U/L (ref 0–44)
AST: 22 U/L (ref 15–41)
Albumin: 3.9 g/dL (ref 3.5–5.0)
Alkaline Phosphatase: 117 U/L (ref 38–126)
Anion gap: 11 (ref 5–15)
BUN: 10 mg/dL (ref 6–20)
CO2: 28 mmol/L (ref 22–32)
Calcium: 9.3 mg/dL (ref 8.9–10.3)
Chloride: 98 mmol/L (ref 98–111)
Creatinine: 0.86 mg/dL (ref 0.61–1.24)
GFR, Estimated: >60 mL/min (ref >60)
Glucose, Bld: 138 mg/dL — ABNORMAL HIGH (ref 70–99)
Potassium: 3.8 mmol/L (ref 3.5–5.1)
Sodium: 137 mmol/L (ref 135–145)
Total Bilirubin: 0.8 mg/dL (ref 0.3–1.2)
Total Protein: 7.5 g/dL (ref 6.5–8.1)

## 2021-07-20 LAB — SAMPLE TO BLOOD BANK

## 2021-07-20 LAB — TSH: TSH: 7.224 u[IU]/mL — ABNORMAL HIGH (ref 0.350–4.500)

## 2021-07-20 MED ORDER — PROCHLORPERAZINE EDISYLATE 10 MG/2ML IJ SOLN
10.0000 mg | Freq: Once | INTRAMUSCULAR | Status: AC
  Administered 2021-07-20: 10 mg via INTRAVENOUS
  Filled 2021-07-20: qty 2

## 2021-07-20 MED ORDER — SODIUM CHLORIDE 0.9% FLUSH
10.0000 mL | Freq: Once | INTRAVENOUS | Status: AC
Start: 2021-07-20 — End: 2021-07-20
  Administered 2021-07-20: 10 mL

## 2021-07-20 MED ORDER — SODIUM CHLORIDE 0.9% FLUSH
10.0000 mL | INTRAVENOUS | Status: DC | PRN
  Administered 2021-07-20: 10 mL

## 2021-07-20 MED ORDER — SODIUM CHLORIDE 0.9 % IV SOLN
400.0000 mg/m2 | Freq: Once | INTRAVENOUS | Status: AC
  Administered 2021-07-20: 900 mg via INTRAVENOUS
  Filled 2021-07-20: qty 20

## 2021-07-20 MED ORDER — SODIUM CHLORIDE 0.9 % IV SOLN: Freq: Once | INTRAVENOUS | Status: AC

## 2021-07-20 MED ORDER — HEPARIN SOD (PORK) LOCK FLUSH 100 UNIT/ML IV SOLN
500.0000 [IU] | Freq: Once | INTRAVENOUS | Status: AC | PRN
  Administered 2021-07-20: 500 [IU]

## 2021-07-20 NOTE — Progress Notes (Signed)
[facility] Spiritual Care Note  Connected with [facility] in infusion per his request via Cassie Heilingoetter/PA. He was very appreciative of chaplain availability to process three recent deaths, on top of his treatment and his mother's cancer diagnosis, and plans to phone to set up an appointment to talk in an environment that affords more privacy. We can explore additional support resources when we talk further.   [place], [place], [facility] Pager [phone] Voicemail [phone]

## 2021-07-20 NOTE — Patient Instructions (Signed)
[name]  Discharge Instructions: Thank you for choosing [facility] to provide your oncology and hematology care.   If you have a lab appointment with the [facility], please go directly to the [facility] and check in at the registration area.   Wear comfortable clothing and clothing appropriate for easy access to any Portacath or PICC line.   We strive to give you quality time with your provider. You may need to reschedule your appointment if you arrive late (15 or more minutes).  Arriving late affects you and other patients whose appointments are after yours.  Also, if you miss three or more appointments without notifying the office, you may be dismissed from the clinic at the provider's discretion.      For prescription refill requests, have your pharmacy contact our office and allow 72 hours for refills to be completed.    Today you received the following chemotherapy and/or immunotherapy agents [facility]      To help prevent nausea and vomiting after your treatment, we encourage you to take your nausea medication as directed.  BELOW ARE SYMPTOMS THAT SHOULD BE REPORTED IMMEDIATELY: *FEVER GREATER THAN 100.4 F (38 C) OR HIGHER *CHILLS OR SWEATING *NAUSEA AND VOMITING THAT IS NOT CONTROLLED WITH YOUR NAUSEA MEDICATION *UNUSUAL SHORTNESS OF BREATH *UNUSUAL BRUISING OR BLEEDING *URINARY PROBLEMS (pain or burning when urinating, or frequent urination) *BOWEL PROBLEMS (unusual diarrhea, constipation, pain near the anus) TENDERNESS IN MOUTH AND THROAT WITH OR WITHOUT PRESENCE OF ULCERS (sore throat, sores in mouth, or a toothache) UNUSUAL RASH, SWELLING OR PAIN  UNUSUAL VAGINAL DISCHARGE OR ITCHING   Items with * indicate a potential emergency and should be followed up as soon as possible or go to the Emergency Department if any problems should occur.  Please show the CHEMOTHERAPY ALERT CARD or IMMUNOTHERAPY ALERT CARD at check-in to the  Emergency Department and triage nurse.  Should you have questions after your visit or need to cancel or reschedule your appointment, please contact [facility]  Dept: [phone]  and follow the prompts.  Office hours are 8:00 a.m. to 4:30 p.m. Monday - Friday. Please note that voicemails left after 4:00 p.m. may not be returned until the following business day.  We are closed weekends and major holidays. You have access to a nurse at all times for urgent questions. Please call the main number to the clinic Dept: [phone] and follow the prompts.   For any non-urgent questions, you may also contact your provider using MyChart. We now offer e-Visits for anyone 33 and older to request care online for non-urgent symptoms. For details visit [email or web address].   Also download the MyChart app! Go to the app store, search "MyChart", open the app, select [facility], and log in with your MyChart username and password.  Due to Covid, a mask is required upon entering the hospital/clinic. If you do not have a mask, one will be given to you upon arrival. For doctor visits, patients may have 1 support person aged 37 or older with them. For treatment visits, patients cannot have anyone with them due to current Covid guidelines and our immunocompromised population.

## 2021-07-21 ENCOUNTER — Other Ambulatory Visit: Payer: Medicare Other

## 2021-07-23 ENCOUNTER — Encounter (HOSPITAL_COMMUNITY): Payer: Self-pay

## 2021-07-23 ENCOUNTER — Ambulatory Visit (HOSPITAL_COMMUNITY)
Admission: RE | Admit: 2021-07-23 | Discharge: 2021-07-23 | Disposition: A | Discharge status: Home / Self Care | Payer: Medicare Other | Source: Ambulatory Visit | Attending: Physician Assistant | Admitting: Physician Assistant

## 2021-07-23 DIAGNOSIS — C3490 Malignant neoplasm of unspecified part of unspecified bronchus or lung: Secondary | ICD-10-CM | POA: Diagnosis not present

## 2021-07-23 DIAGNOSIS — R911 Solitary pulmonary nodule: Secondary | ICD-10-CM | POA: Diagnosis not present

## 2021-07-23 DIAGNOSIS — C3411 Malignant neoplasm of upper lobe, right bronchus or lung: Secondary | ICD-10-CM | POA: Diagnosis not present

## 2021-07-23 DIAGNOSIS — I3139 Other pericardial effusion (noninflammatory): Secondary | ICD-10-CM | POA: Diagnosis not present

## 2021-07-23 DIAGNOSIS — J439 Emphysema, unspecified: Secondary | ICD-10-CM | POA: Diagnosis not present

## 2021-07-23 DIAGNOSIS — R161 Splenomegaly, not elsewhere classified: Secondary | ICD-10-CM | POA: Diagnosis not present

## 2021-07-23 DIAGNOSIS — I700 Atherosclerosis of aorta: Secondary | ICD-10-CM | POA: Diagnosis not present

## 2021-07-23 MED ORDER — HEPARIN SOD (PORK) LOCK FLUSH 100 UNIT/ML IV SOLN
INTRAVENOUS | Status: AC
  Filled 2021-07-23: qty 5

## 2021-07-23 MED ORDER — IOHEXOL 350 MG/ML SOLN
75.0000 mL | Freq: Once | INTRAVENOUS | Status: AC | PRN
  Administered 2021-07-23: 75 mL via INTRAVENOUS

## 2021-07-23 MED ORDER — HEPARIN SOD (PORK) LOCK FLUSH 100 UNIT/ML IV SOLN
500.0000 [IU] | Freq: Once | INTRAVENOUS | Status: AC
  Administered 2021-07-23: 500 [IU] via INTRAVENOUS

## 2021-07-27 ENCOUNTER — Telehealth: Payer: Self-pay

## 2021-07-27 NOTE — Telephone Encounter (Signed)
-----   Message from [employer], PA-C sent at 07/27/2021  2:55 PM EDT ----- Can you let his wife know his scan looked stable (nothing growing) and we will discuss it in more detail with him at the next appointment.  ----- Message ----- From: Interface, Rad Results In Sent: 07/24/2021  10:59 AM EDT To: [name], PA-C

## 2021-07-27 NOTE — Telephone Encounter (Signed)
I spoke with pts wife and advised as indicated. She expressed understanding of this information. She was also made aware of his follow-up appts on 08/10/21.

## 2021-07-28 ENCOUNTER — Other Ambulatory Visit: Payer: Self-pay

## 2021-07-28 ENCOUNTER — Ambulatory Visit: Payer: Medicare Other

## 2021-07-28 ENCOUNTER — Ambulatory Visit: Payer: Medicare Other | Admitting: Physician Assistant

## 2021-07-28 ENCOUNTER — Inpatient Hospital Stay: Payer: Medicare Other | Attending: Physician Assistant

## 2021-07-28 DIAGNOSIS — Z5111 Encounter for antineoplastic chemotherapy: Secondary | ICD-10-CM | POA: Insufficient documentation

## 2021-07-28 DIAGNOSIS — Z86718 Personal history of other venous thrombosis and embolism: Secondary | ICD-10-CM | POA: Diagnosis not present

## 2021-07-28 DIAGNOSIS — Z7982 Long term (current) use of aspirin: Secondary | ICD-10-CM | POA: Insufficient documentation

## 2021-07-28 DIAGNOSIS — Z5112 Encounter for antineoplastic immunotherapy: Secondary | ICD-10-CM | POA: Diagnosis present

## 2021-07-28 DIAGNOSIS — F17210 Nicotine dependence, cigarettes, uncomplicated: Secondary | ICD-10-CM | POA: Diagnosis not present

## 2021-07-28 DIAGNOSIS — C3411 Malignant neoplasm of upper lobe, right bronchus or lung: Secondary | ICD-10-CM | POA: Insufficient documentation

## 2021-07-28 DIAGNOSIS — Z95828 Presence of other vascular implants and grafts: Secondary | ICD-10-CM

## 2021-07-28 DIAGNOSIS — Z923 Personal history of irradiation: Secondary | ICD-10-CM | POA: Diagnosis not present

## 2021-07-28 DIAGNOSIS — I10 Essential (primary) hypertension: Secondary | ICD-10-CM | POA: Diagnosis not present

## 2021-07-28 DIAGNOSIS — Z9221 Personal history of antineoplastic chemotherapy: Secondary | ICD-10-CM | POA: Diagnosis not present

## 2021-07-28 DIAGNOSIS — C3491 Malignant neoplasm of unspecified part of right bronchus or lung: Secondary | ICD-10-CM

## 2021-07-28 DIAGNOSIS — Z79899 Other long term (current) drug therapy: Secondary | ICD-10-CM | POA: Insufficient documentation

## 2021-07-28 DIAGNOSIS — I959 Hypotension, unspecified: Secondary | ICD-10-CM | POA: Insufficient documentation

## 2021-07-28 DIAGNOSIS — D649 Anemia, unspecified: Secondary | ICD-10-CM

## 2021-07-28 LAB — CMP (CANCER CENTER ONLY)
ALT: 43 U/L (ref 0–44)
AST: 35 U/L (ref 15–41)
Albumin: 3.5 g/dL (ref 3.5–5.0)
Alkaline Phosphatase: 108 U/L (ref 38–126)
Anion gap: 8 (ref 5–15)
BUN: 11 mg/dL (ref 6–20)
CO2: 30 mmol/L (ref 22–32)
Calcium: 8.9 mg/dL (ref 8.9–10.3)
Chloride: 99 mmol/L (ref 98–111)
Creatinine: 0.78 mg/dL (ref 0.61–1.24)
GFR, Estimated: >60 mL/min (ref >60)
Glucose, Bld: 128 mg/dL — ABNORMAL HIGH (ref 70–99)
Potassium: 3.4 mmol/L — ABNORMAL LOW (ref 3.5–5.1)
Sodium: 137 mmol/L (ref 135–145)
Total Bilirubin: 0.7 mg/dL (ref 0.3–1.2)
Total Protein: 6.6 g/dL (ref 6.5–8.1)

## 2021-07-28 LAB — CBC WITH DIFFERENTIAL (CANCER CENTER ONLY)
Abs Immature Granulocytes: 0.02 10*3/uL (ref 0.00–0.07)
Basophils Absolute: 0 10*3/uL (ref 0.0–0.1)
Basophils Relative: 1 %
Eosinophils Absolute: 0.1 10*3/uL (ref 0.0–0.5)
Eosinophils Relative: 3 %
HCT: 31.4 % — ABNORMAL LOW (ref 39.0–52.0)
Hemoglobin: 10.1 g/dL — ABNORMAL LOW (ref 13.0–17.0)
Immature Granulocytes: 1 %
Lymphocytes Relative: 12 %
Lymphs Abs: 0.4 10*3/uL — ABNORMAL LOW (ref 0.7–4.0)
MCH: 31.4 pg (ref 26.0–34.0)
MCHC: 32.2 g/dL (ref 30.0–36.0)
MCV: 97.5 fL (ref 80.0–100.0)
Monocytes Absolute: 0.3 10*3/uL (ref 0.1–1.0)
Monocytes Relative: 10 %
Neutro Abs: 2.6 10*3/uL (ref 1.7–7.7)
Neutrophils Relative %: 73 %
Platelet Count: 114 10*3/uL — ABNORMAL LOW (ref 150–400)
RBC: 3.22 MIL/uL — ABNORMAL LOW (ref 4.22–5.81)
RDW: 18.3 % — ABNORMAL HIGH (ref 11.5–15.5)
WBC Count: 3.5 10*3/uL — ABNORMAL LOW (ref 4.0–10.5)
nRBC: 0 % (ref 0.0–0.2)

## 2021-07-28 LAB — SAMPLE TO BLOOD BANK

## 2021-07-28 MED ORDER — HEPARIN SOD (PORK) LOCK FLUSH 100 UNIT/ML IV SOLN
500.0000 [IU] | Freq: Once | INTRAVENOUS | Status: AC
  Administered 2021-07-28: 500 [IU]

## 2021-07-28 MED ORDER — SODIUM CHLORIDE 0.9% FLUSH
10.0000 mL | Freq: Once | INTRAVENOUS | Status: AC
  Administered 2021-07-28: 10 mL

## 2021-07-29 DIAGNOSIS — C3490 Malignant neoplasm of unspecified part of unspecified bronchus or lung: Secondary | ICD-10-CM | POA: Diagnosis not present

## 2021-07-29 DIAGNOSIS — E782 Mixed hyperlipidemia: Secondary | ICD-10-CM | POA: Diagnosis not present

## 2021-07-29 DIAGNOSIS — I10 Essential (primary) hypertension: Secondary | ICD-10-CM | POA: Diagnosis not present

## 2021-07-29 DIAGNOSIS — G894 Chronic pain syndrome: Secondary | ICD-10-CM | POA: Diagnosis not present

## 2021-07-29 DIAGNOSIS — I255 Ischemic cardiomyopathy: Secondary | ICD-10-CM | POA: Diagnosis not present

## 2021-07-29 DIAGNOSIS — G546 Phantom limb syndrome with pain: Secondary | ICD-10-CM | POA: Diagnosis not present

## 2021-07-29 DIAGNOSIS — Z6824 Body mass index (BMI) 24.0-24.9, adult: Secondary | ICD-10-CM | POA: Diagnosis not present

## 2021-07-29 DIAGNOSIS — E063 Autoimmune thyroiditis: Secondary | ICD-10-CM | POA: Diagnosis not present

## 2021-08-04 NOTE — Progress Notes (Signed)
[facility] OFFICE PROGRESS NOTE  Redmond School, MD [address] [place] [address]  DIAGNOSIS: Recurrent lung cancer initially diagnosed as stage IIIA (T3, N2, M0) non-small cell lung cancer, adenosquamous carcinoma diagnosed in June 2019 and presented with large right upper lobe lung mass with questionable chest wall invasion as well as right hilar and mediastinal lymphadenopathy.   Biomarker Findings Tumor Mutational Burden - TMB-High (21 Muts/Mb) Microsatellite status - MS-Stable Genomic Findings For a complete list of the genes assayed, please refer to the Appendix. ATM [id number]* CCND2 P281R KRAS G12V CHEK2 T355fs*15 TP53 E298* 7 Disease relevant genes with no reportable alterations: ALK, EGFR, BRAF, MET, RET, ERBB2, ROS1   PDL1 Expression: 99%  PRIOR THERAPY:  1) Concurrent chemoradiation with chemotherapy consisting of weekly carboplatin for an AUC of 2 and paclitaxel 45 mg/m2.  First dose given on 04/03/2018.  Status post 6 cycles.  Last dose was giving 05/15/2018 with partial response. 2) Consolidation treatment with immunotherapy with Imfinzi (Durvalumab) 10 mg/KG every 2 weeks.  First dose 06/20/2018.  Status post 26 cycles. 3) Systemic treatment with immunotherapy with Keytruda 200 mg IV every 3 weeks.  First dose February 27, 2020. Status post 12 cycles. Discontinued due to disease progression  CURRENT THERAPY: Carboplatin for an AUC of 5 and Alimta 500 mg per metered squared on days 1 IV every 3 weeks.  First dose expected on 03/03/2021. Status post 6 cycle. His dose of carboplatin was reduced to an AUC of 4 and Alimta reduced to 400 mg/m2 due to intolerance. Dr. Julien Nordmann would like to do 5 cycles of carboplatin and Alimta. Starting from cycle #6, we will start him on maintenance Alimta 400 mg/m2.   INTERVAL HISTORY: [name] 60 y.o. male returns to the clinic today for a follow-up visit.  The patient is feeling fine today without any concerning complaints  except for fatigue/groggy. He is unable to verify any of his medications and does not know if he took a pain pill or anxiety pill today. He did say for certain that he did not take a nitroglycerine today though. The patient is currently on maintenance single agent therapy with dose reduced Alimta 400 mg per metered squared.  The patient has frequent pancytopenia requiring supportive care such as blood transfusions.  Insurance previously denied G-CSF.  The patient denies any abnormal bleeding or bruising except for the abnormal bruising and senile purpura on his upper extremities.  Today, the patient denies any signs and symptoms of infection including fever, sore throat, cough, congestion, skin infections, diarrhea, or dysuria.  The patient has baseline memory deficits and is unclear if he is taking his premedications despite reviewing with him at every appointment.  The patient denies any night sweats.  He was previously member evaluated by member the nutritionist team for his prior weight loss.   At his last appointment, the patient became tearful due to life stressors and several deaths in the family.  The patient met with the chaplain which was helpful for him.  He denies any chest pain or hemoptysis.  Reports baseline dyspnea on exertion and chronic cough for which he unfortunately continues to smoke cigarettes.  Denies any vomiting or diarrhea. He has mild nausea but it is controlled with his anti-emetic. He has mild baseline constipation for which he takes his stool softener.  Denies any headache. He reports difficulty reading and believes he needs bifocals but has not had his eyes evaluated. The patient recently had a restaging  CT scan performed.  The patient is here today for evaluation to review his scan results.      MEDICAL HISTORY: Past Medical History:  Diagnosis Date   Acute hepatitis C virus infection 06/19/2007   Qualifier: Diagnosis of  By: Leilani Merl CMA, Tiffany     Acute ST elevation  myocardial infarction (STEMI) involving left anterior descending (LAD) coronary artery (HCC) 05/26/2016   Acute ST elevation myocardial infarction (STEMI) of lateral wall (HCC) 05/26/2016   AICD (automatic cardioverter/defibrillator) present 03/02/2018   Anxiety    Asthma    Atherosclerosis of native arteries of the extremities with intermittent claudication 12/16/2011   Chronic hepatitis C without hepatic coma ([facility]) 05/03/2014   COPD with chronic bronchitis and emphysema ([facility]) 03/13/2018   FEV1 57%   Depression    DVT (deep venous thrombosis) ([facility]) 2009; 2019   RLE; LLE   Encounter for antineoplastic chemotherapy 03/22/2018   Hepatitis C    "tx'd in 2015"   High cholesterol    Ischemic cardiomyopathy 03/02/2018   Non-small cell carcinoma of right lung, stage 3 ([facility]) 03/22/2018   PAD (peripheral artery disease) ([facility]) 01/25/2013   Peripheral vascular disease, unspecified 07/20/2012   Port-A-Cath in place 04/24/2018   ST elevation myocardial infarction involving left anterior descending (LAD) coronary artery (HCC)    STEMI (ST elevation myocardial infarction) ([facility]) 05/26/2016   Tobacco abuse 01/28/2016   Tubular adenoma of colon 07/11/2014   Urinary dribbling     ALLERGIES:  is allergic to lyrica [pregabalin] and neurontin [gabapentin].  MEDICATIONS:  Current Outpatient Medications  Medication Sig Dispense Refill   albuterol (VENTOLIN HFA) 108 (90 Base) MCG/ACT inhaler Inhale 1-2 puffs into the lungs every 4 (four) hours as needed for wheezing or shortness of breath.     alprazolam (XANAX) 2 MG tablet Take 2 mg by mouth 4 (four) times daily as needed for anxiety.     amitriptyline (ELAVIL) 100 MG tablet Take 100 mg by mouth at bedtime.      aspirin 81 MG EC tablet Take 81 mg by mouth daily.     citalopram (CELEXA) 40 MG tablet Take 40 mg by mouth daily.  11   dexamethasone (DECADRON) 4 MG tablet Please take 1 tablet twice a day the day before, the day of, and the day after treatment 40 tablet 2    folic acid (FOLVITE) 1 MG tablet Take 1 tablet (1 mg total) by mouth daily. 30 tablet 2   HYDROcodone-acetaminophen (NORCO) 10-325 MG tablet Take 1 tablet by mouth every 4 (four) hours as needed for moderate pain.   0   levothyroxine (SYNTHROID) 125 MCG tablet Take 125 mcg by mouth daily.     lidocaine-prilocaine (EMLA) cream Apply 1 application topically as needed. 30 g 0   midodrine (PROAMATINE) 10 MG tablet Take 1 tablet (10 mg total) by mouth 3 (three) times daily with meals. 90 tablet 1   nitroGLYCERIN (NITROSTAT) 0.4 MG SL tablet Place 1 tablet (0.4 mg total) under the tongue every 5 (five) minutes x 3 doses as needed for chest pain. 25 tablet 2   potassium chloride SA (KLOR-CON) 20 MEQ tablet Take 1 tablet (20 mEq total) by mouth 2 (two) times daily. 14 tablet 0   prochlorperazine (COMPAZINE) 10 MG tablet Take 1 tablet (10 mg total) by mouth every 6 (six) hours as needed for nausea or vomiting. 30 tablet 2   sucralfate (CARAFATE) 1 g tablet Take 1 tablet (1 g total) by mouth 4 (four)  times daily -  with meals and at bedtime. 5 min before meals for radiation induced esophagitis 120 tablet 2   tamsulosin (FLOMAX) 0.4 MG CAPS capsule Take 0.4 mg by mouth daily.     TRELEGY ELLIPTA 100-62.5-25 MCG/INH AEPB Inhale 1 puff into the lungs daily.     No current facility-administered medications for this visit.   Facility-Administered Medications Ordered in Other Visits  Medication Dose Route Frequency Provider Last Rate Last Admin   heparin lock flush 100 unit/mL  500 Units Intracatheter Once PRN Curt Bears, MD       PEMEtrexed (ALIMTA) 900 mg in sodium chloride 0.9 % 100 mL chemo infusion  400 mg/m2 (Treatment Plan Recorded) Intravenous Once Curt Bears, MD       sodium chloride flush (NS) 0.9 % injection 10 mL  10 mL Intracatheter PRN Curt Bears, MD        SURGICAL HISTORY:  Past Surgical History:  Procedure Laterality Date   AORTOGRAM  08/08/2009   for LLE claudication      By Dr. 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   BIOPSY N/A 05/30/2014   Procedure: BIOPSY;  Surgeon: [name], MD;  Location: [facility];  Service: Endoscopy;  Laterality: N/A;   BLADDER TUMOR EXCISION  8/812   CARDIAC CATHETERIZATION N/A 05/26/2016   Procedure: Left Heart Cath and Coronary Angiography;  Surgeon: [name], MD;  Location: [facility];  Service: Cardiovascular;  Laterality: N/A;   CARDIAC CATHETERIZATION N/A 05/26/2016   Procedure: Coronary Stent Intervention;  Surgeon: [name], MD;  Location: [facility];  Service: Cardiovascular;  Laterality: N/A;   COLONOSCOPY WITH PROPOFOL N/A 05/30/2014   EHU:DJSHFW colonic polyp-likely source of hematochezia-removed as described above   ESOPHAGOGASTRODUODENOSCOPY (EGD) WITH PROPOFOL N/A 05/30/2014   YOV:ZCHYIF and bulbar erosions s/p gastric biopsy. No evidence of portal gastropathy on today's examination.   FEMORAL-TIBIAL BYPASS GRAFT  2009   Right side using non-reversed GSV   By Dr. Anson [place] BYPASS GRAFT  11/24/2007   Right femoral to anterior tibial BPG   by Dr. Oneida Alar   FINGER SURGERY Left    "straightened my pinky"   HAND TENDON SURGERY Left 2013   Left 5th finger   HERNIA REPAIR     "stomach"   ICD IMPLANT N/A 03/02/2018   Procedure: ICD IMPLANT;  Surgeon: [name], MD;  Location: [facility];  Service: Cardiovascular;  Laterality: N/A;   INCISIONAL HERNIA REPAIR N/A 12/25/2014   Procedure: HERNIA REPAIR INCISIONAL WITH MESH;  Surgeon: [name], MD;  Location: [facility];  Service: General;  Laterality: N/A;   INSERTION OF MESH N/A 12/25/2014   Procedure: INSERTION OF MESH;  Surgeon: [name], MD;  Location: [facility];  Service: General;  Laterality: N/A;   IR IMAGING GUIDED PORT INSERTION  04/11/2018   LAPAROSCOPIC CHOLECYSTECTOMY     LOWER EXTREMITY ANGIOGRAM Left 12/30/2015   Procedure: Lower Extremity Angiogram;  Surgeon: [name], MD;   Location: [facility];  Service: Cardiovascular;  Laterality: Left;   PERIPHERAL VASCULAR CATHETERIZATION N/A 12/30/2015   Procedure: Abdominal Aortogram;  Surgeon: [name], MD;  Location: [facility];  Service: Cardiovascular;  Laterality: N/A;   POLYPECTOMY  05/30/2014   Procedure: POLYPECTOMY;  Surgeon: [name], MD;  Location: [facility];  Service: Endoscopy;;   TONSILLECTOMY AND ADENOIDECTOMY  ~ 1970   TYMPANOSTOMY TUBE  PLACEMENT Bilateral ~ 1970    REVIEW OF SYSTEMS:   Review of Systems  Constitutional: Positive for fatigue and decreased appetite. Negative for chills and fever. HENT: Negative for mouth sores, nosebleeds, sore throat and trouble swallowing.   Eyes: Negative for eye problems and icterus.  Respiratory: Positive for baseline cough and shortness of breath with exertion. Negative for hemoptysis.  Cardiovascular: Negative for chest pain and leg swelling.  Gastrointestinal: Positive for mild baseline constipation and nausea. Negative for abdominal pain, diarrhea, and vomiting.  Genitourinary: Negative for bladder incontinence, difficulty urinating, dysuria, frequency and hematuria.   Musculoskeletal: Negative for back pain, gait problem, neck pain and neck stiffness.  Skin: Negative for itching and rash. Positive for bruising on upper extremity bilaterally.  Neurological: Negative for dizziness, extremity weakness, gait problem, headaches, light-headedness and seizures.  Hematological: Positive for easy skin bruising. Negative for adenopathy.  Psychiatric/Behavioral: Positive for memory deficits at baseline. Positive for depressed mood today. Negative for confusion, depression and sleep disturbance. The patient is not nervous/anxious.    PHYSICAL EXAMINATION:  Blood pressure 90/68, pulse 86, temperature 97.7 F (36.5 C), temperature source Temporal, resp. rate 18, height 6' (1.829 m), weight 184 lb 3.2 oz (83.6 kg), SpO2 98 %.  ECOG PERFORMANCE STATUS:  2-3  Physical Exam  Constitutional: Oriented to person, place, and time and chronically ill-appearing male and in no distress. HENT: Head: Normocephalic and atraumatic. Mouth/Throat: Oropharynx is clear and moist. No oropharyngeal exudate. Eyes: Conjunctivae are normal. Right eye exhibits no discharge. Left eye exhibits no discharge. No scleral icterus. Neck: Normal range of motion. Neck supple. Cardiovascular: Normal rate, regular rhythm, normal heart sounds and intact distal pulses.   Pulmonary/Chest:  Effort normal and clear to ascultation bilaterally. No respiratory distress. No rales. Abdominal: Soft. Bowel sounds are normal. Exhibits no distension and no mass. There is no tenderness.  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 Normal range of motion. Exhibits no edema.  Lymphadenopathy:    No cervical adenopathy.  Neurological: Alert and oriented to person, place, and time. Exhibits muscle wasting. Examined in the wheelchair. BKA on right leg.  Skin: Bruising on bilateral upper extremities with some bleeding.  Skin is warm and dry. No rash noted. Not diaphoretic. No erythema. No pallor.  Psychiatric: Mood  judgment normal. Positive for memory deficits. Vitals reviewed.  LABORATORY DATA: Lab Results  Component Value Date   WBC 5.1 08/10/2021   HGB 10.4 (L) 08/10/2021   HCT 33.4 (L) 08/10/2021   MCV 102.8 (H) 08/10/2021   PLT 207 08/10/2021      Chemistry      Component Value Date/Time   NA 138 08/10/2021 1056   NA 139 02/27/2018 1457   K 4.2 08/10/2021 1056   CL 101 08/10/2021 1056   CO2 29 08/10/2021 1056   BUN 7 08/10/2021 1056   BUN 7 02/27/2018 1457   CREATININE 0.85 08/10/2021 1056   CREATININE 1.13 06/28/2016 1453      Component Value Date/Time   CALCIUM 8.6 (L) 08/10/2021 1056   ALKPHOS 123 08/10/2021 1056   AST 19 08/10/2021 1056   ALT 15 08/10/2021 1056   BILITOT 0.4 08/10/2021 1056       RADIOGRAPHIC STUDIES:  CT Chest W Contrast  Result Date:  07/24/2021 CLINICAL DATA:  Primary Cancer Type: Lung Imaging Indication: Assess response to therapy Interval therapy since last imaging? Yes Initial Cancer Diagnosis Date: 03/21/2018; Established by: Biopsy-proven Detailed Pathology: Initially diagnosed as stage IIIA non-small cell lung cancer,  adenosquamous carcinoma. Primary Tumor location:  Right upper lobe. Recurrence? Yes; Date(s) of recurrence: 01/25/2020; Established by: Imaging only Surgeries: ICD implant 2019. Hernia repair with mesh 2016. Cholecystectomy. Bladder tumor excision. Chemotherapy: Yes; Ongoing? Yes; Most recent administration: 07/20/2021 Immunotherapy?  Yes; Type: Imfinzi and Keytruda; Ongoing? No Radiation therapy? Yes; Date Range: 04/04/2018 - 05/18/2018; Target: Right upper lung EXAM: CT CHEST ABDOMEN AND PELVIS WITH CONTRAST TECHNIQUE: Multidetector CT imaging of the chest was performed during intravenous contrast administration. CONTRAST:  34mL OMNIPAQUE IOHEXOL 350 MG/ML SOLN COMPARISON:  Most recent CT chest, abdomen and pelvis 05/01/2021. 02/08/2020 PET-CT. FINDINGS: CT CHEST FINDINGS Cardiovascular: Right chest port catheter. Aortic atherosclerosis. Left chest multi lead pacer defibrillator. Normal heart size. Left coronary artery calcifications and stent. Unchanged, small pericardial effusion. Mediastinum/Nodes: Unchanged post treatment soft tissue thickening about the right hilum. No discretely enlarged mediastinal, hilar, or axillary lymph nodes. Frothy debris in the right mainstem bronchus (series 6, image 57). Thyroid gland and esophagus demonstrate no significant findings. Lungs/Pleura: Unchanged post treatment appearance of the upper right lung, with very dense fibrosis and consolidation of the right upper lobe and a sizable cavitary lesion anteriorly, measuring approximately 4.7 x 3.5 cm (series 6, image 52). Diffuse bilateral bronchial wall thickening. Unchanged 0.8 cm ground-glass nodule of the peripheral left upper lobe  (series 6, image 54). Mild, predominantly paraseptal emphysema. Musculoskeletal: No chest wall mass or suspicious bone lesions identified. CT ABDOMEN PELVIS FINDINGS Hepatobiliary: No focal liver abnormality is seen. Status post cholecystectomy. No biliary dilatation. Pancreas: Unremarkable. No pancreatic ductal dilatation or surrounding inflammatory changes. Spleen: Unchanged mild splenomegaly, maximum span 13.8 cm. Adrenals/Urinary Tract: Adrenal glands are unremarkable. Kidneys are normal, without renal calculi, solid lesion, or hydronephrosis. Bladder is unremarkable. Stomach/Bowel: Stomach is within normal limits. Appendix appears normal. No evidence of bowel wall thickening, distention, or inflammatory changes. Vascular/Lymphatic: Aortic atherosclerosis. Left external iliac artery stent. No enlarged abdominal or pelvic lymph nodes. Reproductive: No mass or other abnormality. Other: No abdominal wall hernia or abnormality. No abdominopelvic ascites. Musculoskeletal: No acute or significant osseous findings. IMPRESSION: 1. Unchanged post treatment appearance of the upper right lung, with very dense fibrosis and consolidation of the right upper lobe and a sizable cavitary lesion anteriorly, measuring approximately 4.7 x 3.5 cm. As on prior examinations, the presence of persistent metabolically active disease could be further assessed by PET-CT. 2. Unchanged post treatment soft tissue thickening about the right hilum. No discretely enlarged mediastinal, hilar, or axillary lymph nodes. 3. Unchanged nonspecific 0.8 cm ground-glass nodule of the peripheral left upper lobe. Attention on follow-up. 4. Frothy debris in the right mainstem bronchus, suggesting aspiration. Diffuse bilateral bronchial wall thickening, consistent with nonspecific infectious or inflammatory bronchitis. 5. No evidence of metastatic disease in the abdomen or pelvis. 6. Emphysema. 7. Coronary artery disease. Aortic Atherosclerosis (ICD10-I70.0)  and Emphysema (ICD10-J43.9). Electronically Signed   By: Delanna Ahmadi M.D.   On: 07/24/2021 10:56   CT Abdomen Pelvis W Contrast  Result Date: 07/24/2021 CLINICAL DATA:  Primary Cancer Type: Lung Imaging Indication: Assess response to therapy Interval therapy since last imaging? Yes Initial Cancer Diagnosis Date: 03/21/2018; Established by: Biopsy-proven Detailed Pathology: Initially diagnosed as stage IIIA non-small cell lung cancer, adenosquamous carcinoma. Primary Tumor location:  Right upper lobe. Recurrence? Yes; Date(s) of recurrence: 01/25/2020; Established by: Imaging only Surgeries: ICD implant 2019. Hernia repair with mesh 2016. Cholecystectomy. Bladder tumor excision. Chemotherapy: Yes; Ongoing? Yes; Most recent administration: 07/20/2021 Immunotherapy?  Yes; Type: Imfinzi and Keytruda; Ongoing? No Radiation  therapy? Yes; Date Range: 04/04/2018 - 05/18/2018; Target: Right upper lung EXAM: CT CHEST ABDOMEN AND PELVIS WITH CONTRAST TECHNIQUE: Multidetector CT imaging of the chest was performed during intravenous contrast administration. CONTRAST:  20mL OMNIPAQUE IOHEXOL 350 MG/ML SOLN COMPARISON:  Most recent CT chest, abdomen and pelvis 05/01/2021. 02/08/2020 PET-CT. FINDINGS: CT CHEST FINDINGS Cardiovascular: Right chest port catheter. Aortic atherosclerosis. Left chest multi lead pacer defibrillator. Normal heart size. Left coronary artery calcifications and stent. Unchanged, small pericardial effusion. Mediastinum/Nodes: Unchanged post treatment soft tissue thickening about the right hilum. No discretely enlarged mediastinal, hilar, or axillary lymph nodes. Frothy debris in the right mainstem bronchus (series 6, image 57). Thyroid gland and esophagus demonstrate no significant findings. Lungs/Pleura: Unchanged post treatment appearance of the upper right lung, with very dense fibrosis and consolidation of the right upper lobe and a sizable cavitary lesion anteriorly, measuring approximately 4.7 x  3.5 cm (series 6, image 52). Diffuse bilateral bronchial wall thickening. Unchanged 0.8 cm ground-glass nodule of the peripheral left upper lobe (series 6, image 54). Mild, predominantly paraseptal emphysema. Musculoskeletal: No chest wall mass or suspicious bone lesions identified. CT ABDOMEN PELVIS FINDINGS Hepatobiliary: No focal liver abnormality is seen. Status post cholecystectomy. No biliary dilatation. Pancreas: Unremarkable. No pancreatic ductal dilatation or surrounding inflammatory changes. Spleen: Unchanged mild splenomegaly, maximum span 13.8 cm. Adrenals/Urinary Tract: Adrenal glands are unremarkable. Kidneys are normal, without renal calculi, solid lesion, or hydronephrosis. Bladder is unremarkable. Stomach/Bowel: Stomach is within normal limits. Appendix appears normal. No evidence of bowel wall thickening, distention, or inflammatory changes. Vascular/Lymphatic: Aortic atherosclerosis. Left external iliac artery stent. No enlarged abdominal or pelvic lymph nodes. Reproductive: No mass or other abnormality. Other: No abdominal wall hernia or abnormality. No abdominopelvic ascites. Musculoskeletal: No acute or significant osseous findings. IMPRESSION: 1. Unchanged post treatment appearance of the upper right lung, with very dense fibrosis and consolidation of the right upper lobe and a sizable cavitary lesion anteriorly, measuring approximately 4.7 x 3.5 cm. As on prior examinations, the presence of persistent metabolically active disease could be further assessed by PET-CT. 2. Unchanged post treatment soft tissue thickening about the right hilum. No discretely enlarged mediastinal, hilar, or axillary lymph nodes. 3. Unchanged nonspecific 0.8 cm ground-glass nodule of the peripheral left upper lobe. Attention on follow-up. 4. Frothy debris in the right mainstem bronchus, suggesting aspiration. Diffuse bilateral bronchial wall thickening, consistent with nonspecific infectious or inflammatory  bronchitis. 5. No evidence of metastatic disease in the abdomen or pelvis. 6. Emphysema. 7. Coronary artery disease. Aortic Atherosclerosis (ICD10-I70.0) and Emphysema (ICD10-J43.9). Electronically Signed   By: Delanna Ahmadi M.D.   On: 07/24/2021 10:56     ASSESSMENT/PLAN:  This is a very pleasant 60 year old Caucasian male with recurrent lung cancer initially diagnosed as stage IIIa non-small cell lung cancer, adenosquamous carcinoma.  He presented with a large right upper lobe lung mass with questionable chest wall invasion as well as a right hilar and mediastinal lymphadenopathy.  He was diagnosed in June 2019. His PDL1 expression is 99%   He completed 6 cycles of concurrent chemoradiation with carboplatin and paclitaxel.  He had a partial response to treatment.  The patient underwent consolidation immunotherapy with Imfinzi 10 mg/kg IV every 2 weeks.  He is status post 26 cycles.  He completed this on 07/04/2019   He was on observation until he showed evidence of local recurrence.  He then underwent immunotherapy with Keytruda 200 mg IV every 3 weeks. He is status post 9 cycles. He tolerated this  well without any concerning complains. The patient has missed several appointments since November 2021. He only returned to the clinic once for treatment in that interval. Therefore, on his follow up imaging studies in March 2022, he had evidence of disease progession.  He restarted treatment at that time and continued to tolerate it well. Unfortunately, he had evidence for disease progression in May 2022 and this was discontinued.    The patient is currently undergoing treatment with systemic chemotherapy with carboplatin for an AUC of 5 and Alimta 500 mg/m IV every 3 weeks. He is status post 7 cycles.  His dose of carboplatin was reduced to AUC of 4 and his Alimta was reduced to 400 mg per metered squared due to intolerance and myelosuppression. Dr. Julien Nordmann would like to do 5 cycles of carboplatin for an  AUC of 4 and Alimta 400 mg/m2. He had been having a challenging time with pancytopenia recently. Starting from cycle #6, he started maintenance single agent Alimta 400 mg/m2.    The patient recently had a restaging CT scan performed.  Dr. Julien Nordmann personally and independently reviewed the scan and discussed the results with the patient today.  Scan showed no evidence for disease progression. Dr. Julien Nordmann recommends that he continue on the same treatment at the same dose.  Patient will continue with cycle #8 of dose reduced Alimta 400 mg per metered squared today as scheduled.  For his hypotension, we will arrange for him to receive additional IVF today. He is no longer on BP medication since his episode of hypotensive shock a few months ago.   Advised to see his eye doctor for evaluation of his progressively worsening vision. He is overdue for an eye exam per patient report.   The patient was advised to call immediately if he has any concerning symptoms in the interval. The patient voices understanding of current disease status and treatment options and is in agreement with the current care plan. All questions were answered. The patient knows to call the clinic with any problems, questions or concerns. We can certainly see the patient much sooner if necessary  No orders of the defined types were placed in this encounter.     Alfa Leibensperger L Sharetta Ricchio, PA-C 08/10/21  ADDENDUM: Hematology/Oncology Attending: I had a face-to-face encounter with the patient today.  I reviewed his record, lab, scan and recommended his care plan.  This is a very pleasant 60 years old white male with metastatic non-small cell lung cancer that was initially diagnosed as stage IIIa adenocarcinoma status post concurrent chemoradiation followed by consolidation immunotherapy.  The patient was found to have disease progression and is started treatment with single agent Keytruda discontinued secondary to disease progression  after 9 cycles. He started systemic chemotherapy with carboplatin and Alimta for 5 cycles followed by maintenance treatment with single agent Alimta starting from cycle #6 status post 3 cycles. He has been tolerating this treatment well with no concerning adverse effects except for fatigue. He had repeat CT scan of the chest, abdomen pelvis performed recently.  I personally and independently reviewed the scans and discussed the results with the patient today. The patient has no evidence for disease progression on the recent scan. I recommended for him to continue his current treatment with reduced dose Alimta 400 Mg/M2 every 3 weeks. He will come back for follow-up visit in 3 weeks for evaluation before the next cycle of his treatment. For the hypertension he was advised to discuss with his primary care physician adjustment  of his antihypertensive medication.  We will arrange for the patient to receive 1 L of normal saline in the clinic today. The patient was advised to call immediately if he has any other concerning symptoms in the interval. The total time spent in the appointment was 30 minutes.  Disclaimer: This note was dictated with voice recognition software. Similar sounding words can inadvertently be transcribed and may be missed upon review. Eilleen Kempf, MD 08/10/21

## 2021-08-10 ENCOUNTER — Other Ambulatory Visit: Payer: Self-pay

## 2021-08-10 ENCOUNTER — Inpatient Hospital Stay: Payer: Medicare Other

## 2021-08-10 ENCOUNTER — Inpatient Hospital Stay (HOSPITAL_BASED_OUTPATIENT_CLINIC_OR_DEPARTMENT_OTHER): Payer: Medicare Other | Admitting: Physician Assistant

## 2021-08-10 VITALS — BP 103/80 | HR 75

## 2021-08-10 VITALS — BP 90/68 | HR 86 | Temp 97.7°F | Resp 18 | Ht 72.0 in | Wt 184.2 lb

## 2021-08-10 DIAGNOSIS — Z5111 Encounter for antineoplastic chemotherapy: Secondary | ICD-10-CM

## 2021-08-10 DIAGNOSIS — C3490 Malignant neoplasm of unspecified part of unspecified bronchus or lung: Secondary | ICD-10-CM

## 2021-08-10 DIAGNOSIS — C3491 Malignant neoplasm of unspecified part of right bronchus or lung: Secondary | ICD-10-CM

## 2021-08-10 DIAGNOSIS — I10 Essential (primary) hypertension: Secondary | ICD-10-CM | POA: Diagnosis not present

## 2021-08-10 DIAGNOSIS — Z79899 Other long term (current) drug therapy: Secondary | ICD-10-CM | POA: Diagnosis not present

## 2021-08-10 DIAGNOSIS — Z9221 Personal history of antineoplastic chemotherapy: Secondary | ICD-10-CM | POA: Diagnosis not present

## 2021-08-10 DIAGNOSIS — C3411 Malignant neoplasm of upper lobe, right bronchus or lung: Secondary | ICD-10-CM | POA: Diagnosis not present

## 2021-08-10 DIAGNOSIS — I959 Hypotension, unspecified: Secondary | ICD-10-CM

## 2021-08-10 DIAGNOSIS — Z7982 Long term (current) use of aspirin: Secondary | ICD-10-CM | POA: Diagnosis not present

## 2021-08-10 DIAGNOSIS — Z923 Personal history of irradiation: Secondary | ICD-10-CM | POA: Diagnosis not present

## 2021-08-10 DIAGNOSIS — Z95828 Presence of other vascular implants and grafts: Secondary | ICD-10-CM

## 2021-08-10 DIAGNOSIS — Z86718 Personal history of other venous thrombosis and embolism: Secondary | ICD-10-CM | POA: Diagnosis not present

## 2021-08-10 DIAGNOSIS — F17210 Nicotine dependence, cigarettes, uncomplicated: Secondary | ICD-10-CM | POA: Diagnosis not present

## 2021-08-10 LAB — CBC WITH DIFFERENTIAL (CANCER CENTER ONLY)
Abs Immature Granulocytes: 0.07 10*3/uL (ref 0.00–0.07)
Basophils Absolute: 0 10*3/uL (ref 0.0–0.1)
Basophils Relative: 1 %
Eosinophils Absolute: 0.1 10*3/uL (ref 0.0–0.5)
Eosinophils Relative: 2 %
HCT: 33.4 % — ABNORMAL LOW (ref 39.0–52.0)
Hemoglobin: 10.4 g/dL — ABNORMAL LOW (ref 13.0–17.0)
Immature Granulocytes: 1 %
Lymphocytes Relative: 7 %
Lymphs Abs: 0.4 10*3/uL — ABNORMAL LOW (ref 0.7–4.0)
MCH: 32 pg (ref 26.0–34.0)
MCHC: 31.1 g/dL (ref 30.0–36.0)
MCV: 102.8 fL — ABNORMAL HIGH (ref 80.0–100.0)
Monocytes Absolute: 0.3 10*3/uL (ref 0.1–1.0)
Monocytes Relative: 5 %
Neutro Abs: 4.3 10*3/uL (ref 1.7–7.7)
Neutrophils Relative %: 84 %
Platelet Count: 207 10*3/uL (ref 150–400)
RBC: 3.25 MIL/uL — ABNORMAL LOW (ref 4.22–5.81)
RDW: 18.3 % — ABNORMAL HIGH (ref 11.5–15.5)
WBC Count: 5.1 10*3/uL (ref 4.0–10.5)
nRBC: 0 % (ref 0.0–0.2)

## 2021-08-10 LAB — CMP (CANCER CENTER ONLY)
ALT: 15 U/L (ref 0–44)
AST: 19 U/L (ref 15–41)
Albumin: 3.6 g/dL (ref 3.5–5.0)
Alkaline Phosphatase: 123 U/L (ref 38–126)
Anion gap: 8 (ref 5–15)
BUN: 7 mg/dL (ref 6–20)
CO2: 29 mmol/L (ref 22–32)
Calcium: 8.6 mg/dL — ABNORMAL LOW (ref 8.9–10.3)
Chloride: 101 mmol/L (ref 98–111)
Creatinine: 0.85 mg/dL (ref 0.61–1.24)
GFR, Estimated: >60 mL/min (ref >60)
Glucose, Bld: 113 mg/dL — ABNORMAL HIGH (ref 70–99)
Potassium: 4.2 mmol/L (ref 3.5–5.1)
Sodium: 138 mmol/L (ref 135–145)
Total Bilirubin: 0.4 mg/dL (ref 0.3–1.2)
Total Protein: 6.5 g/dL (ref 6.5–8.1)

## 2021-08-10 LAB — SAMPLE TO BLOOD BANK

## 2021-08-10 MED ORDER — SODIUM CHLORIDE 0.9 % IV SOLN
400.0000 mg/m2 | Freq: Once | INTRAVENOUS | Status: AC
  Administered 2021-08-10: 900 mg via INTRAVENOUS
  Filled 2021-08-10: qty 20

## 2021-08-10 MED ORDER — SODIUM CHLORIDE 0.9 % IV SOLN: Freq: Once | INTRAVENOUS | Status: AC

## 2021-08-10 MED ORDER — SODIUM CHLORIDE 0.9% FLUSH: 10.0000 mL | INTRAVENOUS | Status: DC | PRN

## 2021-08-10 MED ORDER — SODIUM CHLORIDE 0.9% FLUSH
10.0000 mL | Freq: Once | INTRAVENOUS | Status: AC
  Administered 2021-08-10: 10 mL

## 2021-08-10 MED ORDER — HEPARIN SOD (PORK) LOCK FLUSH 100 UNIT/ML IV SOLN: 500.0000 [IU] | Freq: Once | INTRAVENOUS | Status: DC | PRN

## 2021-08-10 MED ORDER — PROCHLORPERAZINE EDISYLATE 10 MG/2ML IJ SOLN
10.0000 mg | Freq: Once | INTRAMUSCULAR | Status: AC
  Administered 2021-08-10: 10 mg via INTRAVENOUS
  Filled 2021-08-10: qty 2

## 2021-08-10 NOTE — Patient Instructions (Signed)

## 2021-08-10 NOTE — Patient Instructions (Signed)
[facility]  Discharge Instructions: Thank you for choosing [facility] to provide your oncology and hematology care.   If you have a lab appointment with the [facility], please go directly to the [facility] and check in at the registration area.   Wear comfortable clothing and clothing appropriate for easy access to any Portacath or PICC line.   We strive to give you quality time with your provider. You may need to reschedule your appointment if you arrive late (15 or more minutes).  Arriving late affects you and other patients whose appointments are after yours.  Also, if you miss three or more appointments without notifying the office, you may be dismissed from the clinic at the provider's discretion.      For prescription refill requests, have your pharmacy contact our office and allow 72 hours for refills to be completed.    Today you received the following chemotherapy and/or immunotherapy agents Alimta      To help prevent nausea and vomiting after your treatment, we encourage you to take your nausea medication as directed.  BELOW ARE SYMPTOMS THAT SHOULD BE REPORTED IMMEDIATELY: *FEVER GREATER THAN 100.4 F (38 C) OR HIGHER *CHILLS OR SWEATING *NAUSEA AND VOMITING THAT IS NOT CONTROLLED WITH YOUR NAUSEA MEDICATION *UNUSUAL SHORTNESS OF BREATH *UNUSUAL BRUISING OR BLEEDING *URINARY PROBLEMS (pain or burning when urinating, or frequent urination) *BOWEL PROBLEMS (unusual diarrhea, constipation, pain near the anus) TENDERNESS IN MOUTH AND THROAT WITH OR WITHOUT PRESENCE OF ULCERS (sore throat, sores in mouth, or a toothache) UNUSUAL RASH, SWELLING OR PAIN  UNUSUAL VAGINAL DISCHARGE OR ITCHING   Items with * indicate a potential emergency and should be followed up as soon as possible or go to the Emergency Department if any problems should occur.  Please show the CHEMOTHERAPY ALERT CARD or IMMUNOTHERAPY ALERT CARD at check-in to the  Emergency Department and triage nurse.  Should you have questions after your visit or need to cancel or reschedule your appointment, please contact [facility]  Dept: [phone]  and follow the prompts.  Office hours are 8:00 a.m. to 4:30 p.m. Monday - Friday. Please note that voicemails left after 4:00 p.m. may not be returned until the following business day.  We are closed weekends and major holidays. You have access to a nurse at all times for urgent questions. Please call the main number to the clinic Dept: [phone] and follow the prompts.   For any non-urgent questions, you may also contact your provider using MyChart. We now offer e-Visits for anyone 18 and older to request care online for non-urgent symptoms. For details visit [email or web address].   Also download the MyChart app! Go to the app store, search "MyChart", open the app, select [facility], and log in with your MyChart username and password.  Due to Covid, a mask is required upon entering the hospital/clinic. If you do not have a mask, one will be given to you upon arrival. For doctor visits, patients may have 1 support person aged 75 or older with them. For treatment visits, patients cannot have anyone with them due to current Covid guidelines and our immunocompromised population.

## 2021-08-28 DIAGNOSIS — I10 Essential (primary) hypertension: Secondary | ICD-10-CM | POA: Diagnosis not present

## 2021-08-28 DIAGNOSIS — G894 Chronic pain syndrome: Secondary | ICD-10-CM | POA: Diagnosis not present

## 2021-08-31 ENCOUNTER — Telehealth: Payer: Self-pay | Admitting: Medical Oncology

## 2021-08-31 ENCOUNTER — Ambulatory Visit: Payer: Medicare Other

## 2021-08-31 ENCOUNTER — Ambulatory Visit: Payer: Medicare Other | Admitting: Internal Medicine

## 2021-08-31 ENCOUNTER — Other Ambulatory Visit: Payer: Medicare Other

## 2021-08-31 ENCOUNTER — Telehealth: Payer: Self-pay | Admitting: Internal Medicine

## 2021-08-31 NOTE — Telephone Encounter (Signed)
Pt is stuck in traffic. He wants to r/s. Schedule message sent.

## 2021-08-31 NOTE — Telephone Encounter (Signed)
Scheduled per sch msg. Called and left msg  

## 2021-09-01 ENCOUNTER — Encounter: Payer: Self-pay | Admitting: Internal Medicine

## 2021-09-01 ENCOUNTER — Inpatient Hospital Stay: Payer: Medicare Other

## 2021-09-01 ENCOUNTER — Inpatient Hospital Stay: Payer: Medicare Other | Attending: Physician Assistant | Admitting: Internal Medicine

## 2021-09-01 ENCOUNTER — Other Ambulatory Visit: Payer: Self-pay

## 2021-09-01 VITALS — BP 101/76 | HR 72

## 2021-09-01 VITALS — BP 89/73 | HR 95 | Temp 97.2°F | Resp 19 | Ht 72.0 in | Wt 180.9 lb

## 2021-09-01 DIAGNOSIS — E039 Hypothyroidism, unspecified: Secondary | ICD-10-CM | POA: Diagnosis not present

## 2021-09-01 DIAGNOSIS — Z79899 Other long term (current) drug therapy: Secondary | ICD-10-CM | POA: Insufficient documentation

## 2021-09-01 DIAGNOSIS — Z5111 Encounter for antineoplastic chemotherapy: Secondary | ICD-10-CM | POA: Insufficient documentation

## 2021-09-01 DIAGNOSIS — C3411 Malignant neoplasm of upper lobe, right bronchus or lung: Secondary | ICD-10-CM | POA: Insufficient documentation

## 2021-09-01 DIAGNOSIS — C3491 Malignant neoplasm of unspecified part of right bronchus or lung: Secondary | ICD-10-CM

## 2021-09-01 DIAGNOSIS — Z7982 Long term (current) use of aspirin: Secondary | ICD-10-CM | POA: Insufficient documentation

## 2021-09-01 DIAGNOSIS — C3490 Malignant neoplasm of unspecified part of unspecified bronchus or lung: Secondary | ICD-10-CM

## 2021-09-01 DIAGNOSIS — Z79890 Hormone replacement therapy (postmenopausal): Secondary | ICD-10-CM | POA: Diagnosis not present

## 2021-09-01 DIAGNOSIS — I952 Hypotension due to drugs: Secondary | ICD-10-CM

## 2021-09-01 DIAGNOSIS — I10 Essential (primary) hypertension: Secondary | ICD-10-CM | POA: Diagnosis not present

## 2021-09-01 DIAGNOSIS — Z95828 Presence of other vascular implants and grafts: Secondary | ICD-10-CM

## 2021-09-01 LAB — CMP (CANCER CENTER ONLY)
ALT: 24 U/L (ref 0–44)
AST: 20 U/L (ref 15–41)
Albumin: 3.8 g/dL (ref 3.5–5.0)
Alkaline Phosphatase: 103 U/L (ref 38–126)
Anion gap: 12 (ref 5–15)
BUN: 16 mg/dL (ref 6–20)
CO2: 27 mmol/L (ref 22–32)
Calcium: 9.1 mg/dL (ref 8.9–10.3)
Chloride: 99 mmol/L (ref 98–111)
Creatinine: 0.93 mg/dL (ref 0.61–1.24)
GFR, Estimated: >60 mL/min (ref >60)
Glucose, Bld: 118 mg/dL — ABNORMAL HIGH (ref 70–99)
Potassium: 3.5 mmol/L (ref 3.5–5.1)
Sodium: 138 mmol/L (ref 135–145)
Total Bilirubin: 0.3 mg/dL (ref 0.3–1.2)
Total Protein: 6.9 g/dL (ref 6.5–8.1)

## 2021-09-01 LAB — CBC WITH DIFFERENTIAL (CANCER CENTER ONLY)
Abs Immature Granulocytes: 0.1 10*3/uL — ABNORMAL HIGH (ref 0.00–0.07)
Basophils Absolute: 0 10*3/uL (ref 0.0–0.1)
Basophils Relative: 0 %
Eosinophils Absolute: 0 10*3/uL (ref 0.0–0.5)
Eosinophils Relative: 0 %
HCT: 34.4 % — ABNORMAL LOW (ref 39.0–52.0)
Hemoglobin: 11.1 g/dL — ABNORMAL LOW (ref 13.0–17.0)
Immature Granulocytes: 1 %
Lymphocytes Relative: 7 %
Lymphs Abs: 0.6 10*3/uL — ABNORMAL LOW (ref 0.7–4.0)
MCH: 32.2 pg (ref 26.0–34.0)
MCHC: 32.3 g/dL (ref 30.0–36.0)
MCV: 99.7 fL (ref 80.0–100.0)
Monocytes Absolute: 0.7 10*3/uL (ref 0.1–1.0)
Monocytes Relative: 8 %
Neutro Abs: 7.6 10*3/uL (ref 1.7–7.7)
Neutrophils Relative %: 84 %
Platelet Count: 227 10*3/uL (ref 150–400)
RBC: 3.45 MIL/uL — ABNORMAL LOW (ref 4.22–5.81)
RDW: 16.2 % — ABNORMAL HIGH (ref 11.5–15.5)
WBC Count: 9.1 10*3/uL (ref 4.0–10.5)
nRBC: 0 % (ref 0.0–0.2)

## 2021-09-01 LAB — SAMPLE TO BLOOD BANK

## 2021-09-01 MED ORDER — SODIUM CHLORIDE 0.9% FLUSH
10.0000 mL | INTRAVENOUS | Status: DC | PRN
  Administered 2021-09-01: 10 mL

## 2021-09-01 MED ORDER — HEPARIN SOD (PORK) LOCK FLUSH 100 UNIT/ML IV SOLN
500.0000 [IU] | Freq: Once | INTRAVENOUS | Status: AC | PRN
  Administered 2021-09-01: 500 [IU]

## 2021-09-01 MED ORDER — SODIUM CHLORIDE 0.9% FLUSH
10.0000 mL | Freq: Once | INTRAVENOUS | Status: AC
  Administered 2021-09-01: 10 mL

## 2021-09-01 MED ORDER — SODIUM CHLORIDE 0.9 % IV SOLN: INTRAVENOUS | Status: AC

## 2021-09-01 MED ORDER — PROCHLORPERAZINE EDISYLATE 10 MG/2ML IJ SOLN
10.0000 mg | Freq: Once | INTRAMUSCULAR | Status: AC
  Administered 2021-09-01: 10 mg via INTRAVENOUS
  Filled 2021-09-01: qty 2

## 2021-09-01 MED ORDER — SODIUM CHLORIDE 0.9 % IV SOLN
400.0000 mg/m2 | Freq: Once | INTRAVENOUS | Status: AC
  Administered 2021-09-01: 900 mg via INTRAVENOUS
  Filled 2021-09-01: qty 20

## 2021-09-01 MED ORDER — CYANOCOBALAMIN 1000 MCG/ML IJ SOLN
1000.0000 ug | Freq: Once | INTRAMUSCULAR | Status: AC
  Administered 2021-09-01: 1000 ug via INTRAMUSCULAR
  Filled 2021-09-01: qty 1

## 2021-09-01 MED ORDER — SODIUM CHLORIDE 0.9 % IV SOLN: Freq: Once | INTRAVENOUS | Status: AC

## 2021-09-01 NOTE — Progress Notes (Signed)
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 He had disease progression in June 2021.  Biomarker Findings Tumor Mutational Burden - TMB-High (21 Muts/Mb) Microsatellite status - MS-Stable Genomic Findings For a complete list of the genes assayed, please refer to the Appendix. ATM O1224* CCND2 P281R KRAS G12V CHEK2 T372f*15 TP53 E298* 7 Disease relevant genes with no reportable alterations: ALK, EGFR, BRAF, MET, RET, ERBB2, ROS1  PDL1 Expression: 99%.   PRIOR THERAPY:  1) Concurrent chemoradiation with chemotherapy consisting of weekly carboplatin for an AUC of 2 and paclitaxel 45 mg/m2.  First dose given on 04/03/2018.  Status post 6 cycles.  Last dose was giving 05/15/2018 with partial response. 2) Consolidation treatment with immunotherapy with Imfinzi (Durvalumab) 10 mg/KG every 2 weeks.  First dose 06/20/2018.  Status post 26 cycles. 3) Systemic treatment with immunotherapy with Keytruda 200 mg IV every 3 weeks.  First dose February 27, 2020. Status post 12 cycles. Discontinued due to disease progression.   CURRENT THERAPY:  Carboplatin for an AUC of 5 and Alimta 500 mg per metered squared on days 1 IV every 3 weeks.  First dose expected on 03/03/2021. Status post 8 cycle. His dose of carboplatin was reduced to an AUC of 4 and Alimta reduced to 400 mg/m2 due to intolerance. Dr. MJulien Nordmannwould like to do 5 cycles of carboplatin and Alimta. Starting from cycle #6, we will start him on maintenance Alimta 400 mg/m2.   INTERVAL HISTORY: Mark ANDING60y.o. male  returns to the clinic today for follow-up visit.  The patient is feeling fine today with no concerning complaints.  He has been tolerating his treatment with single agent Alimta fairly well except for occasional nausea.  He denied having any significant weight loss or night sweats.  He has good appetite.  He denied having any current chest pain, shortness of breath, cough or hemoptysis.  He denied having any fever or chills.  He has no nausea, vomiting, diarrhea or constipation.  He is here today for evaluation before starting cycle #9 of his treatment.    MEDICAL HISTORY: Past Medical History:  Diagnosis Date   Acute hepatitis C virus infection 06/19/2007   Qualifier: Diagnosis of  By: MLeilani MerlCMA, Tiffany     Acute ST elevation myocardial infarction (STEMI) involving left anterior descending (LAD) coronary artery (HCC) 05/26/2016   Acute ST elevation myocardial infarction (STEMI) of lateral wall (HCC) 05/26/2016   AICD (automatic cardioverter/defibrillator) present 03/02/2018   Anxiety    Asthma    Atherosclerosis of native arteries of the extremities with intermittent claudication 12/16/2011   Chronic hepatitis C without hepatic coma ([facility] 05/03/2014   COPD with chronic bronchitis and emphysema ([facility] 03/13/2018   FEV1 57%   Depression    DVT (deep venous thrombosis) ([facility] 2009; 2019   RLE; LLE   Encounter for antineoplastic chemotherapy 03/22/2018   Hepatitis C    "tx'd in 2015"   High cholesterol    Ischemic cardiomyopathy 03/02/2018   Non-small cell carcinoma of right lung, stage 3 ([facility] 03/22/2018   PAD (peripheral artery disease) ([facility] 01/25/2013  Peripheral vascular disease, unspecified 07/20/2012   Port-A-Cath in place 04/24/2018   ST elevation myocardial infarction involving left anterior descending (LAD) coronary artery (HCC)    STEMI (ST elevation myocardial infarction) ([facility]) 05/26/2016   Tobacco abuse 01/28/2016   Tubular adenoma of colon 07/11/2014   Urinary dribbling     ALLERGIES:   is allergic to lyrica [pregabalin] and neurontin [gabapentin].  MEDICATIONS:  Current Outpatient Medications  Medication Sig Dispense Refill   albuterol (VENTOLIN HFA) 108 (90 Base) MCG/ACT inhaler Inhale 1-2 puffs into the lungs every 4 (four) hours as needed for wheezing or shortness of breath.     alprazolam (XANAX) 2 MG tablet Take 2 mg by mouth 4 (four) times daily as needed for anxiety.     amitriptyline (ELAVIL) 100 MG tablet Take 100 mg by mouth at bedtime.      aspirin 81 MG EC tablet Take 81 mg by mouth daily.     atorvastatin (LIPITOR) 80 MG tablet Take 80 mg by mouth daily.     citalopram (CELEXA) 40 MG tablet Take 40 mg by mouth daily.  11   dexamethasone (DECADRON) 4 MG tablet Please take 1 tablet twice a day the day before, the day of, and the day after treatment 40 tablet 2   folic acid (FOLVITE) 1 MG tablet Take 1 tablet (1 mg total) by mouth daily. 30 tablet 2   furosemide (LASIX) 20 MG tablet Take 20 mg by mouth daily.     HYDROcodone-acetaminophen (NORCO) 10-325 MG tablet Take 1 tablet by mouth every 4 (four) hours as needed for moderate pain.   0   levothyroxine (SYNTHROID) 125 MCG tablet Take 125 mcg by mouth daily.     lidocaine-prilocaine (EMLA) cream Apply 1 application topically as needed. 30 g 0   lisinopril (ZESTRIL) 2.5 MG tablet Take 2.5 mg by mouth daily.     midodrine (PROAMATINE) 10 MG tablet Take 1 tablet (10 mg total) by mouth 3 (three) times daily with meals. 90 tablet 1   tamsulosin (FLOMAX) 0.4 MG CAPS capsule Take 0.4 mg by mouth daily.     TRELEGY ELLIPTA 100-62.5-25 MCG/INH AEPB Inhale 1 puff into the lungs daily.     nitroGLYCERIN (NITROSTAT) 0.4 MG SL tablet Place 1 tablet (0.4 mg total) under the tongue every 5 (five) minutes x 3 doses as needed for chest pain. (Patient not taking: Reported on 09/01/2021) 25 tablet 2   prochlorperazine (COMPAZINE) 10 MG tablet Take 1 tablet (10 mg total) by mouth every 6 (six) hours as needed for nausea or vomiting.  (Patient not taking: Reported on 09/01/2021) 30 tablet 2   sucralfate (CARAFATE) 1 g tablet Take 1 tablet (1 g total) by mouth 4 (four) times daily -  with meals and at bedtime. 5 min before meals for radiation induced esophagitis 120 tablet 2   No current facility-administered medications for this visit.    SURGICAL HISTORY:  Past Surgical History:  Procedure Laterality Date   AORTOGRAM  08/08/2009   for LLE claudication     By Dr. 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   BIOPSY N/A 05/30/2014   Procedure: BIOPSY;  Surgeon: [name], MD;  Location: [facility];  Service: Endoscopy;  Laterality: N/A;   BLADDER TUMOR EXCISION  8/812   CARDIAC CATHETERIZATION N/A 05/26/2016   Procedure: Left Heart Cath and Coronary Angiography;  Surgeon: [name], MD;  Location: [facility];  Service: Cardiovascular;  Laterality: N/A;  CARDIAC CATHETERIZATION N/A 05/26/2016   Procedure: Coronary Stent Intervention;  Surgeon: [name], MD;  Location: [facility];  Service: Cardiovascular;  Laterality: N/A;   COLONOSCOPY WITH PROPOFOL N/A 05/30/2014   MGN:OIBBCW colonic polyp-likely source of hematochezia-removed as described above   ESOPHAGOGASTRODUODENOSCOPY (EGD) WITH PROPOFOL N/A 05/30/2014   UGQ:BVQXIH and bulbar erosions s/p gastric biopsy. No evidence of portal gastropathy on today's examination.   FEMORAL-TIBIAL BYPASS GRAFT  2009   Right side using non-reversed GSV   By Dr. Anson [place] BYPASS GRAFT  11/24/2007   Right femoral to anterior tibial BPG   by Dr. Oneida Alar   FINGER SURGERY Left    "straightened my pinky"   HAND TENDON SURGERY Left 2013   Left 5th finger   HERNIA REPAIR     "stomach"   ICD IMPLANT N/A 03/02/2018   Procedure: ICD IMPLANT;  Surgeon: [name], MD;  Location: [facility];  Service: Cardiovascular;  Laterality: N/A;   INCISIONAL HERNIA REPAIR N/A 12/25/2014   Procedure: HERNIA REPAIR INCISIONAL WITH MESH;  Surgeon:  [name], MD;  Location: [facility];  Service: General;  Laterality: N/A;   INSERTION OF MESH N/A 12/25/2014   Procedure: INSERTION OF MESH;  Surgeon: [name], MD;  Location: [facility];  Service: General;  Laterality: N/A;   IR IMAGING GUIDED PORT INSERTION  04/11/2018   LAPAROSCOPIC CHOLECYSTECTOMY     LOWER EXTREMITY ANGIOGRAM Left 12/30/2015   Procedure: Lower Extremity Angiogram;  Surgeon: [name], MD;  Location: [facility];  Service: Cardiovascular;  Laterality: Left;   PERIPHERAL VASCULAR CATHETERIZATION N/A 12/30/2015   Procedure: Abdominal Aortogram;  Surgeon: [name], MD;  Location: [facility];  Service: Cardiovascular;  Laterality: N/A;   POLYPECTOMY  05/30/2014   Procedure: POLYPECTOMY;  Surgeon: [name], MD;  Location: [facility];  Service: Endoscopy;;   TONSILLECTOMY AND ADENOIDECTOMY  ~ 1970   TYMPANOSTOMY TUBE PLACEMENT Bilateral ~ 1970    REVIEW OF SYSTEMS:  A comprehensive review of systems was negative except for: Constitutional: positive for fatigue   PHYSICAL EXAMINATION: General appearance: alert, cooperative, fatigued, and no distress Head: Normocephalic, without obvious abnormality, atraumatic Neck: no adenopathy, no JVD, supple, symmetrical, trachea midline, and thyroid not enlarged, symmetric, no tenderness/mass/nodules Lymph nodes: Cervical, supraclavicular, and axillary nodes normal. Resp: clear to auscultation bilaterally Back: symmetric, no curvature. ROM normal. No CVA tenderness. Cardio: regular rate and rhythm, S1, S2 normal, no murmur, click, rub or gallop GI: soft, non-tender; bowel sounds normal; no masses,  no organomegaly Extremities: Right below knee amputation otherwise no edema.  ECOG PERFORMANCE STATUS: 1 - Symptomatic but completely ambulatory  Blood pressure (!) 89/73, pulse 95, temperature (!) 97.2 F (36.2 C), temperature source Tympanic, resp. rate 19, height 6' (1.829 m), weight 180 lb 14.4 oz (82.1 kg),  SpO2 99 %.  LABORATORY DATA: Lab Results  Component Value Date   WBC 9.1 09/01/2021   HGB 11.1 (L) 09/01/2021   HCT 34.4 (L) 09/01/2021   MCV 99.7 09/01/2021   PLT 227 09/01/2021      Chemistry      Component Value Date/Time   NA 138 09/01/2021 0752   NA 139 02/27/2018 1457   K 3.5 09/01/2021 0752   CL 99 09/01/2021 0752   CO2 27 09/01/2021 0752   BUN 16 09/01/2021 0752   BUN 7 02/27/2018 1457   CREATININE 0.93 09/01/2021 0752   CREATININE  1.13 06/28/2016 1453      Component Value Date/Time   CALCIUM 9.1 09/01/2021 0752   ALKPHOS 103 09/01/2021 0752   AST 20 09/01/2021 0752   ALT 24 09/01/2021 0752   BILITOT 0.3 09/01/2021 0752       RADIOGRAPHIC STUDIES: No results found.  ASSESSMENT AND PLAN: This is a very pleasant 60 years old white male recently diagnosed with a stage IIIa non-small cell lung cancer, adenosquamous carcinoma.  He underwent a course of concurrent chemoradiation with weekly carboplatin and paclitaxel status post 6 cycles with partial response.  He has no actionable mutation but PD-L1 expression is 99%. The patient completed treatment with consolidation immunotherapy with Imfinzi status post 26 cycles.  He tolerated this treatment well with no concerning adverse effects. The patient was on observation for several months but he developed disease progression. He underwent treatment with systemic immunotherapy with Keytruda 200 mg IV every 3 weeks status post 12 cycles.  He missed a few cycles of his treatment because of traveling to [place] to take care of his parents.   He tolerated this treatment well but unfortunately had evidence for disease progression. He started systemic chemotherapy with carboplatin for AUC of 5 and Alimta 500 Mg/M2 status post 8 cycles.  Starting from cycle #6 the patient is on single agent treatment with Alimta 400 Mg/M2 every 3 weeks.  He has been tolerating this treatment well with no concerning adverse effect except for occasional  nausea and fatigue. I recommended for him to proceed with cycle #9 today as planned. I will see the patient back for follow-up visit in 3 weeks for evaluation before the next cycle of his treatment. For the hypothyroidism, the patient will continue on levothyroxine. For the hypertension, the patient was advised to discuss with his primary care physician adjustment of his blood pressure medication.  I will also give him 500 cc of normal saline intravenously in the clinic today. He was advised to call immediately if she has any other concerning symptoms in the interval. The patient voices understanding of current disease status and treatment options and is in agreement with the current care plan. All questions were answered. The patient knows to call the clinic with any problems, questions or concerns. We can certainly see the patient much sooner if necessary.   Disclaimer: This note was dictated with voice recognition software. Similar sounding words can inadvertently be transcribed and may not be corrected upon review.

## 2021-09-01 NOTE — Patient Instructions (Signed)
Steps to Quit Smoking Smoking tobacco is the leading cause of preventable death. It can affect almost every organ in the body. Smoking puts you and people around you at risk for many serious, long-lasting (chronic) diseases. Quitting smoking can be hard, but it is one of the best things that you can do for your health. It is never too late to quit. How do I get ready to quit? When you decide to quit smoking, make a plan to help you succeed. Before you quit: Pick a date to quit. Set a date within the next 2 weeks to give you time to prepare. Write down the reasons why you are quitting. Keep this list in places where you will see it often. Tell your family, friends, and co-workers that you are quitting. Their support is important. Talk with your doctor about the choices that may help you quit. Find out if your health insurance will pay for these treatments. Know the people, places, things, and activities that make you want to smoke (triggers). Avoid them. What first steps can I take to quit smoking? Throw away all cigarettes at home, at work, and in your car. Throw away the things that you use when you smoke, such as ashtrays and lighters. Clean your car. Make sure to empty the ashtray. Clean your home, including curtains and carpets. What can I do to help me quit smoking? Talk with your doctor about taking medicines and seeing a counselor at the same time. You are more likely to succeed when you do both. If you are pregnant or breastfeeding, talk with your doctor about counseling or other ways to quit smoking. Do not take medicine to help you quit smoking unless your doctor tells you to do so. To quit smoking: Quit right away Quit smoking totally, instead of slowly cutting back on how much you smoke over a period of time. Go to counseling. You are more likely to quit if you go to counseling sessions regularly. Take medicine You may take medicines to help you quit. Some medicines need a  prescription, and some you can buy over-the-counter. Some medicines may contain a drug called nicotine to replace the nicotine in cigarettes. Medicines may: Help you to stop having the desire to smoke (cravings). Help to stop the problems that come when you stop smoking (withdrawal symptoms). Your doctor may ask you to use: Nicotine patches, gum, or lozenges. Nicotine inhalers or sprays. Non-nicotine medicine that is taken by mouth. Find resources Find resources and other ways to help you quit smoking and remain smoke-free after you quit. These resources are most helpful when you use them often. They include: Online chats with a counselor. Phone quitlines. Printed self-help materials. Support groups or group counseling. Text messaging programs. Mobile phone apps. Use apps on your mobile phone or tablet that can help you stick to your quit plan. There are many free apps for mobile phones and tablets as well as websites. Examples include Quit Guide from the CDC and smokefree.gov  What things can I do to make it easier to quit?  Talk to your family and friends. Ask them to support and encourage you. Call a phone quitline (1-800-QUIT-NOW), reach out to support groups, or work with a counselor. Ask people who smoke to not smoke around you. Avoid places that make you want to smoke, such as: Bars. Parties. Smoke-break areas at work. Spend time with people who do not smoke. Lower the stress in your life. Stress can make you want to   smoke. Try these things to help your stress: Getting regular exercise. Doing deep-breathing exercises. Doing yoga. Meditating. Doing a body scan. To do this, close your eyes, focus on one area of your body at a time from head to toe. Notice which parts of your body are tense. Try to relax the muscles in those areas. How will I feel when I quit smoking? Day 1 to 3 weeks Within the first 24 hours, you may start to have some problems that come from quitting tobacco.  These problems are very bad 2-3 days after you quit, but they do not often last for more than 2-3 weeks. You may get these symptoms: Mood swings. Feeling restless, nervous, angry, or annoyed. Trouble concentrating. Dizziness. Strong desire for high-sugar foods and nicotine. Weight gain. Trouble pooping (constipation). Feeling like you may vomit (nausea). Coughing or a sore throat. Changes in how the medicines that you take for other issues work in your body. Depression. Trouble sleeping (insomnia). Week 3 and afterward After the first 2-3 weeks of quitting, you may start to notice more positive results, such as: Better sense of smell and taste. Less coughing and sore throat. Slower heart rate. Lower blood pressure. Clearer skin. Better breathing. Fewer sick days. Quitting smoking can be hard. Do not give up if you fail the first time. Some people need to try a few times before they succeed. Do your best to stick to your quit plan, and talk with your doctor if you have any questions or concerns. Summary Smoking tobacco is the leading cause of preventable death. Quitting smoking can be hard, but it is one of the best things that you can do for your health. When you decide to quit smoking, make a plan to help you succeed. Quit smoking right away, not slowly over a period of time. When you start quitting, seek help from your doctor, family, or friends. This information is not intended to replace advice given to you by your health care provider. Make sure you discuss any questions you have with your health care provider. Document Revised: 05/22/2021 Document Reviewed: 12/02/2018 Elsevier Patient Education  2022 [employer]  

## 2021-09-01 NOTE — Patient Instructions (Signed)
[facility]  Discharge Instructions: Thank you for choosing [facility] to provide your oncology and hematology care.   If you have a lab appointment with the [facility], please go directly to the [facility] and check in at the registration area.   Wear comfortable clothing and clothing appropriate for easy access to any Portacath or PICC line.   We strive to give you quality time with your provider. You may need to reschedule your appointment if you arrive late (15 or more minutes).  Arriving late affects you and other patients whose appointments are after yours.  Also, if you miss three or more appointments without notifying the office, you may be dismissed from the clinic at the provider's discretion.      For prescription refill requests, have your pharmacy contact our office and allow 72 hours for refills to be completed.    Today you received the following chemotherapy and/or immunotherapy agents: Pemetrexed ([place])      To help prevent nausea and vomiting after your treatment, we encourage you to take your nausea medication as directed.  BELOW ARE SYMPTOMS THAT SHOULD BE REPORTED IMMEDIATELY: *FEVER GREATER THAN 100.4 F (38 C) OR HIGHER *CHILLS OR SWEATING *NAUSEA AND VOMITING THAT IS NOT CONTROLLED WITH YOUR NAUSEA MEDICATION *UNUSUAL SHORTNESS OF BREATH *UNUSUAL BRUISING OR BLEEDING *URINARY PROBLEMS (pain or burning when urinating, or frequent urination) *BOWEL PROBLEMS (unusual diarrhea, constipation, pain near the anus) TENDERNESS IN MOUTH AND THROAT WITH OR WITHOUT PRESENCE OF ULCERS (sore throat, sores in mouth, or a toothache) UNUSUAL RASH, SWELLING OR PAIN  UNUSUAL VAGINAL DISCHARGE OR ITCHING   Items with * indicate a potential emergency and should be followed up as soon as possible or go to the Emergency Department if any problems should occur.  Please show the CHEMOTHERAPY ALERT CARD or IMMUNOTHERAPY ALERT CARD at  check-in to the Emergency Department and triage nurse.  Should you have questions after your visit or need to cancel or reschedule your appointment, please contact [facility]  Dept: [phone]  and follow the prompts.  Office hours are 8:00 a.m. to 4:30 p.m. Monday - Friday. Please note that voicemails left after 4:00 p.m. may not be returned until the following business day.  We are closed weekends and major holidays. You have access to a nurse at all times for urgent questions. Please call the main number to the clinic Dept: [phone] and follow the prompts.   For any non-urgent questions, you may also contact your provider using MyChart. We now offer e-Visits for anyone 76 and older to request care online for non-urgent symptoms. For details visit [email or web address].   Also download the MyChart app! Go to the app store, search "MyChart", open the app, select [facility], and log in with your MyChart username and password.  Due to Covid, a mask is required upon entering the hospital/clinic. If you do not have a mask, one will be given to you upon arrival. For doctor visits, patients may have 1 support person aged 71 or older with them. For treatment visits, patients cannot have anyone with them due to current Covid guidelines and our immunocompromised population.

## 2021-09-02 ENCOUNTER — Ambulatory Visit (INDEPENDENT_AMBULATORY_CARE_PROVIDER_SITE_OTHER): Payer: Medicare Other

## 2021-09-02 DIAGNOSIS — I255 Ischemic cardiomyopathy: Secondary | ICD-10-CM | POA: Diagnosis not present

## 2021-09-04 LAB — CUP PACEART REMOTE DEVICE CHECK
Battery Remaining Longevity: 106 mo
Battery Voltage: 2.99 V
Brady Statistic RV Percent Paced: 0 %
Date Time Interrogation Session: 20221208193726
HighPow Impedance: 70 Ohm
Implantable Lead Implant Date: 20190606
Implantable Lead Location: 753860
Implantable Pulse Generator Implant Date: 20190606
Lead Channel Impedance Value: 285 Ohm
Lead Channel Impedance Value: 342 Ohm
Lead Channel Pacing Threshold Amplitude: 0.875 V
Lead Channel Pacing Threshold Pulse Width: 0.4 ms
Lead Channel Sensing Intrinsic Amplitude: 15.625 mV
Lead Channel Sensing Intrinsic Amplitude: 15.625 mV
Lead Channel Setting Pacing Amplitude: 2.5 V
Lead Channel Setting Pacing Pulse Width: 0.4 ms
Lead Channel Setting Sensing Sensitivity: 0.3 mV

## 2021-09-10 NOTE — Progress Notes (Signed)
Remote ICD transmission.   

## 2021-09-15 NOTE — Progress Notes (Deleted)
[facility] OFFICE PROGRESS NOTE  Redmond School, MD [address] [place] [address]  DIAGNOSIS: Recurrent lung cancer initially diagnosed as stage IIIA (T3, N2, M0) non-small cell lung cancer, adenosquamous carcinoma diagnosed in June 2019 and presented with large right upper lobe lung mass with questionable chest wall invasion as well as right hilar and mediastinal lymphadenopathy.   Biomarker Findings Tumor Mutational Burden - TMB-High (21 Muts/Mb) Microsatellite status - MS-Stable Genomic Findings For a complete list of the genes assayed, please refer to the Appendix. ATM W1191* CCND2 P281R KRAS G12V CHEK2 T354f*15 TP53 E298* 7 Disease relevant genes with no reportable alterations: ALK, EGFR, BRAF, MET, RET, ERBB2, ROS1   PDL1 Expression: 99%  PRIOR THERAPY: 1) Concurrent chemoradiation with chemotherapy consisting of weekly carboplatin for an AUC of 2 and paclitaxel 45 mg/m2.  First dose given on 04/03/2018.  Status post 6 cycles.  Last dose was giving 05/15/2018 with partial response. 2) Consolidation treatment with immunotherapy with Imfinzi (Durvalumab) 10 mg/KG every 2 weeks.  First dose 06/20/2018.  Status post 26 cycles. 3) Systemic treatment with immunotherapy with Keytruda 200 mg IV every 3 weeks.  First dose February 27, 2020. Status post 12 cycles. Discontinued due to disease progression  CURRENT THERAPY:  Carboplatin for an AUC of 5 and Alimta 500 mg per metered squared on days 1 IV every 3 weeks.  First dose expected on 03/03/2021. Status post 9 cycle. His dose of carboplatin was reduced to an AUC of 4 and Alimta reduced to 400 mg/m2 due to intolerance. Starting from cycle #6, he has been on maintenance Alimta 400 mg/m2.   INTERVAL HISTORY: Mark [id number]. male returns  to the clinic today for a follow-up visit.  The patient is feeling fine today without any concerning complaints except for ***. The patient is currently on maintenance single agent  therapy with dose reduced Alimta 400 mg per metered squared.  The patient has frequent pancytopenia requiring supportive care such as blood transfusions.  Insurance previously denied G-CSF.  The patient denies any abnormal bleeding or bruising except for the abnormal bruising and senile purpura on his upper extremities.  Today, the patient denies any signs and symptoms of infection including fever, sore throat, cough, congestion, skin infections, diarrhea, or dysuria.  The patient has baseline memory deficits and is unclear if he is taking his premedications despite reviewing with him at every appointment.  The patient denies any night sweats.  He was previously member evaluated by member the nutritionist team for his prior weight loss.  He denies any chest pain or hemoptysis.  Reports baseline dyspnea on exertion and chronic cough for which he unfortunately continues to smoke cigarettes.  Denies any vomiting or diarrhea. He has mild nausea but it is controlled with his anti-emetic. He has mild baseline constipation for which he takes his stool softener.  Denies any headache. He reports difficulty reading and believes he needs bifocals but has not had his eyes evaluated.  The patient is here today for evaluation and repeat blood work before starting cycle #10.   MEDICAL HISTORY: Past Medical History:  Diagnosis Date   Acute hepatitis C virus infection 06/19/2007   Qualifier: Diagnosis of  By: MLeilani MerlCMA, Tiffany     Acute ST elevation myocardial infarction (STEMI) involving left anterior descending (LAD) coronary artery (HCC) 05/26/2016   Acute ST elevation myocardial infarction (STEMI) of lateral wall ([facility] 05/26/2016   AICD (automatic cardioverter/defibrillator) present 03/02/2018   Anxiety  Asthma    Atherosclerosis of native arteries of the extremities with intermittent claudication 12/16/2011   Chronic hepatitis C without hepatic coma ([facility]) 05/03/2014   COPD with chronic bronchitis and emphysema  ([facility]) 03/13/2018   FEV1 57%   Depression    DVT (deep venous thrombosis) ([facility]) 2009; 2019   RLE; LLE   Encounter for antineoplastic chemotherapy 03/22/2018   Hepatitis C    "tx'd in 2015"   High cholesterol    Ischemic cardiomyopathy 03/02/2018   Non-small cell carcinoma of right lung, stage 3 ([facility]) 03/22/2018   PAD (peripheral artery disease) ([facility]) 01/25/2013   Peripheral vascular disease, unspecified 07/20/2012   Port-A-Cath in place 04/24/2018   ST elevation myocardial infarction involving left anterior descending (LAD) coronary artery (HCC)    STEMI (ST elevation myocardial infarction) ([facility]) 05/26/2016   Tobacco abuse 01/28/2016   Tubular adenoma of colon 07/11/2014   Urinary dribbling     ALLERGIES:  is allergic to lyrica [pregabalin] and neurontin [gabapentin].  MEDICATIONS:  Current Outpatient Medications  Medication Sig Dispense Refill   albuterol (VENTOLIN HFA) 108 (90 Base) MCG/ACT inhaler Inhale 1-2 puffs into the lungs every 4 (four) hours as needed for wheezing or shortness of breath.     alprazolam (XANAX) 2 MG tablet Take 2 mg by mouth 4 (four) times daily as needed for anxiety.     amitriptyline (ELAVIL) 100 MG tablet Take 100 mg by mouth at bedtime.      aspirin 81 MG EC tablet Take 81 mg by mouth daily.     atorvastatin (LIPITOR) 80 MG tablet Take 80 mg by mouth daily.     citalopram (CELEXA) 40 MG tablet Take 40 mg by mouth daily.  11   dexamethasone (DECADRON) 4 MG tablet Please take 1 tablet twice a day the day before, the day of, and the day after treatment 40 tablet 2   folic acid (FOLVITE) 1 MG tablet Take 1 tablet (1 mg total) by mouth daily. 30 tablet 2   furosemide (LASIX) 20 MG tablet Take 20 mg by mouth daily.     HYDROcodone-acetaminophen (NORCO) 10-325 MG tablet Take 1 tablet by mouth every 4 (four) hours as needed for moderate pain.   0   levothyroxine (SYNTHROID) 125 MCG tablet Take 125 mcg by mouth daily.     lidocaine-prilocaine (EMLA) cream Apply 1  application topically as needed. 30 g 0   lisinopril (ZESTRIL) 2.5 MG tablet Take 2.5 mg by mouth daily.     midodrine (PROAMATINE) 10 MG tablet Take 1 tablet (10 mg total) by mouth 3 (three) times daily with meals. 90 tablet 1   nitroGLYCERIN (NITROSTAT) 0.4 MG SL tablet Place 1 tablet (0.4 mg total) under the tongue every 5 (five) minutes x 3 doses as needed for chest pain. (Patient not taking: Reported on 09/01/2021) 25 tablet 2   prochlorperazine (COMPAZINE) 10 MG tablet Take 1 tablet (10 mg total) by mouth every 6 (six) hours as needed for nausea or vomiting. (Patient not taking: Reported on 09/01/2021) 30 tablet 2   sucralfate (CARAFATE) 1 g tablet Take 1 tablet (1 g total) by mouth 4 (four) times daily -  with meals and at bedtime. 5 min before meals for radiation induced esophagitis 120 tablet 2   tamsulosin (FLOMAX) 0.4 MG CAPS capsule Take 0.4 mg by mouth daily.     TRELEGY ELLIPTA 100-62.5-25 MCG/INH AEPB Inhale 1 puff into the lungs daily.     No current facility-administered medications for  this visit.    SURGICAL HISTORY:  Past Surgical History:  Procedure Laterality Date   AORTOGRAM  08/08/2009   for LLE claudication     By Dr. 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   BIOPSY N/A 05/30/2014   Procedure: BIOPSY;  Surgeon: [name], MD;  Location: [facility];  Service: Endoscopy;  Laterality: N/A;   BLADDER TUMOR EXCISION  8/812   CARDIAC CATHETERIZATION N/A 05/26/2016   Procedure: Left Heart Cath and Coronary Angiography;  Surgeon: [name], MD;  Location: [facility];  Service: Cardiovascular;  Laterality: N/A;   CARDIAC CATHETERIZATION N/A 05/26/2016   Procedure: Coronary Stent Intervention;  Surgeon: [name], MD;  Location: [facility];  Service: Cardiovascular;  Laterality: N/A;   COLONOSCOPY WITH PROPOFOL N/A 05/30/2014   RDE:YCXKGY colonic polyp-likely source of hematochezia-removed as described above   ESOPHAGOGASTRODUODENOSCOPY (EGD)  WITH PROPOFOL N/A 05/30/2014   JEH:UDJSHF and bulbar erosions s/p gastric biopsy. No evidence of portal gastropathy on today's examination.   FEMORAL-TIBIAL BYPASS GRAFT  2009   Right side using non-reversed GSV   By Dr. Anson [place] BYPASS GRAFT  11/24/2007   Right femoral to anterior tibial BPG   by Dr. Oneida Alar   FINGER SURGERY Left    "straightened my pinky"   HAND TENDON SURGERY Left 2013   Left 5th finger   HERNIA REPAIR     "stomach"   ICD IMPLANT N/A 03/02/2018   Procedure: ICD IMPLANT;  Surgeon: [name], MD;  Location: [facility];  Service: Cardiovascular;  Laterality: N/A;   INCISIONAL HERNIA REPAIR N/A 12/25/2014   Procedure: HERNIA REPAIR INCISIONAL WITH MESH;  Surgeon: [name], MD;  Location: [facility];  Service: General;  Laterality: N/A;   INSERTION OF MESH N/A 12/25/2014   Procedure: INSERTION OF MESH;  Surgeon: [name], MD;  Location: [facility];  Service: General;  Laterality: N/A;   IR IMAGING GUIDED PORT INSERTION  04/11/2018   LAPAROSCOPIC CHOLECYSTECTOMY     LOWER EXTREMITY ANGIOGRAM Left 12/30/2015   Procedure: Lower Extremity Angiogram;  Surgeon: [name], MD;  Location: [facility];  Service: Cardiovascular;  Laterality: Left;   PERIPHERAL VASCULAR CATHETERIZATION N/A 12/30/2015   Procedure: Abdominal Aortogram;  Surgeon: [name], MD;  Location: [facility];  Service: Cardiovascular;  Laterality: N/A;   POLYPECTOMY  05/30/2014   Procedure: POLYPECTOMY;  Surgeon: [name], MD;  Location: [facility];  Service: Endoscopy;;   TONSILLECTOMY AND ADENOIDECTOMY  ~ 1970   TYMPANOSTOMY TUBE PLACEMENT Bilateral ~ 1970    REVIEW OF SYSTEMS:   Review of Systems  Constitutional: Negative for appetite change, chills, fatigue, fever and unexpected weight change.  HENT:   Negative for mouth sores, nosebleeds, sore throat and trouble swallowing.   Eyes: Negative for eye problems and icterus.  Respiratory: Negative  for cough, hemoptysis, shortness of breath and wheezing.   Cardiovascular: Negative for chest pain and leg swelling.  Gastrointestinal: Negative for abdominal pain, constipation, diarrhea, nausea and vomiting.  Genitourinary: Negative for bladder incontinence, difficulty urinating, dysuria, frequency and hematuria.   Musculoskeletal: Negative for back pain, gait problem, neck pain and neck stiffness.  Skin: Negative for itching and rash.  Neurological: Negative for dizziness, extremity weakness, gait problem, headaches, light-headedness and seizures.  Hematological: Negative for adenopathy. Does not bruise/bleed easily.  Psychiatric/Behavioral: Negative for confusion, depression and sleep disturbance. The patient is  not nervous/anxious.     PHYSICAL EXAMINATION:  There were no vitals taken for this visit.  ECOG PERFORMANCE STATUS: {CHL ONC ECOG [id number]  Physical Exam  Constitutional: Oriented to person, place, and time and well-developed, well-nourished, and in no distress. No distress.  HENT:  Head: Normocephalic and atraumatic.  Mouth/Throat: Oropharynx is clear and moist. No oropharyngeal exudate.  Eyes: Conjunctivae are normal. Right eye exhibits no discharge. Left eye exhibits no discharge. No scleral icterus.  Neck: Normal range of motion. Neck supple.  Cardiovascular: Normal rate, regular rhythm, normal heart sounds and intact distal pulses.   Pulmonary/Chest: Effort normal and breath sounds normal. No respiratory distress. No wheezes. No rales.  Abdominal: Soft. Bowel sounds are normal. Exhibits no distension and no mass. There is no tenderness.  Musculoskeletal: Normal range of motion. Exhibits no edema.  Lymphadenopathy:    No cervical adenopathy.  Neurological: Alert and oriented to person, place, and time. Exhibits normal muscle tone. Gait normal. Coordination normal.  Skin: Skin is warm and dry. No rash noted. Not diaphoretic. No erythema. No pallor.  Psychiatric:  Mood, memory and judgment normal.  Vitals reviewed.  LABORATORY DATA: Lab Results  Component Value Date   WBC 9.1 09/01/2021   HGB 11.1 (L) 09/01/2021   HCT 34.4 (L) 09/01/2021   MCV 99.7 09/01/2021   PLT 227 09/01/2021      Chemistry      Component Value Date/Time   NA 138 09/01/2021 0752   NA 139 02/27/2018 1457   K 3.5 09/01/2021 0752   CL 99 09/01/2021 0752   CO2 27 09/01/2021 0752   BUN 16 09/01/2021 0752   BUN 7 02/27/2018 1457   CREATININE 0.93 09/01/2021 0752   CREATININE 1.13 06/28/2016 1453      Component Value Date/Time   CALCIUM 9.1 09/01/2021 0752   ALKPHOS 103 09/01/2021 0752   AST 20 09/01/2021 0752   ALT 24 09/01/2021 0752   BILITOT 0.3 09/01/2021 0752       RADIOGRAPHIC STUDIES:  CUP PACEART REMOTE DEVICE CHECK  Result Date: 09/04/2021 Scheduled remote reviewed. Normal device function.  OptiVol remains elevated. Prior remote from 09/08 sent to triage. Routed again to notify no change. LH    ASSESSMENT/PLAN:  This is a very pleasant 60 year old Caucasian male with recurrent lung cancer initially diagnosed as stage IIIa non-small cell lung cancer, adenosquamous carcinoma.  He presented with a large right upper lobe lung mass with questionable chest wall invasion as well as a right hilar and mediastinal lymphadenopathy.  He was diagnosed in June 2019. His PDL1 expression is 99%   He completed 6 cycles of concurrent chemoradiation with carboplatin and paclitaxel.  He had a partial response to treatment.   The patient underwent consolidation immunotherapy with Imfinzi 10 mg/kg IV every 2 weeks.  He is status post 26 cycles.  He completed this on 07/04/2019   He was on observation until he showed evidence of local recurrence.  He then underwent immunotherapy with Keytruda 200 mg IV every 3 weeks. He is status post 9 cycles. He tolerated this well without any concerning complains. The patient has missed several appointments since November 2021. He only  returned to the clinic once for treatment in that interval. Therefore, on his follow up imaging studies in March 2022, he had evidence of disease progession.  He restarted treatment at that time and continued to tolerate it well. Unfortunately, he had evidence for disease progression in May 2022 and this was discontinued.  The patient is currently undergoing treatment with systemic chemotherapy with carboplatin for an AUC of 5 and Alimta 500 mg/m IV every 3 weeks. He is status post 9 cycles.  His dose of carboplatin was reduced to AUC of 4 and his Alimta was reduced to 400 mg per metered squared due to intolerance and myelosuppression. Starting from cycle #6, he started maintenance single agent Alimta 400 mg/m2.   The patient was seen with Dr. Julien Nordmann today.  Labs were reviewed.  Recommend that he ***with cycle #10 today scheduled.  We will see him back for follow-up visit in 3 weeks for evaluation before starting cycle #11.  I will arrange for restaging CT scan of the chest, abdomen, and pelvis prior to starting his next cycle of treatment.  Additional IV fluids  Unable to verify any medications.  The patient was advised to call immediately if he has any concerning symptoms in the interval. The patient voices understanding of current disease status and treatment options and is in agreement with the current care plan. All questions were answered. The patient knows to call the clinic with any problems, questions or concerns. We can certainly see the patient much sooner if necessary        No orders of the defined types were placed in this encounter.    I spent {CHL ONC TIME VISIT - [id number]:[id number]} counseling the patient face to face. The total time spent in the appointment was {CHL ONC TIME VISIT - PIRJJ:[id number]}.  Mark Osbourne L Carlissa Pesola, PA-C 09/15/21

## 2021-09-22 ENCOUNTER — Ambulatory Visit: Payer: Medicare Other

## 2021-09-22 ENCOUNTER — Telehealth: Payer: Self-pay | Admitting: *Deleted

## 2021-09-22 ENCOUNTER — Other Ambulatory Visit: Payer: Medicare Other

## 2021-09-22 ENCOUNTER — Telehealth: Payer: Self-pay

## 2021-09-22 ENCOUNTER — Ambulatory Visit: Payer: Medicare Other | Admitting: Physician Assistant

## 2021-09-22 MED ORDER — FUROSEMIDE 20 MG PO TABS: ORAL_TABLET | ORAL | 0 refills | Status: DC

## 2021-09-22 NOTE — Telephone Encounter (Signed)
I attempted to call the [name] and LM regarding his missed appts this morning. [name] had a PF s/Lab, OV with Dr. Julien Nordmann and infusion.  Schedule message has been sent to reach out to the [name] to r/s these appts.

## 2021-09-22 NOTE — Telephone Encounter (Signed)
-----   Message from Will Meredith Leeds, MD sent at 09/20/2021 12:00 PM EST ----- Take lasix 20 mg daily for 4 days. ----- Message ----- From: Stanton Kidney, RN Sent: 09/18/2021   3:53 PM EST To: Will Meredith Leeds, MD, Curt Bears, MD  Spoke to patient and wife.  Spent 20 minutes on the phone. Wife reports pt has not been taking  Lasix since hospital d/c in June as was instructed on d/c paperwork. Cannot find any oncology notes that show pt on Lasix except for 12/6 OV w/ Dr. Julien Nordmann, although wife reports pt is not taking it. Will forward to Dr. Curt Bears & Dr. Julien Nordmann for review/advisement  Pt and wife aware will follow up after both MDs have advised on

## 2021-09-22 NOTE — Telephone Encounter (Signed)
Spoke with the pts wife [name] (on [place]) and endorse to recommendation per Dr. Curt Bears, for the pt to only take lasix 20 mg po daily for 4 days only. [name] confirmed the pt has 20 mg tablets of lasix on hand at this time, and will provide him 1 tablet po daily for 4 days only. Clarification provided in result note that pt was initially on lasix 20 po daily and was discontinued from a recent hospital stay.  With that, Dr. Curt Bears now just wants the pt to take lasix 20 mg po daily for 4 days only, then stop. [name] verbalized understanding and agrees with this plan.  She will endorse to the pt.

## 2021-09-23 ENCOUNTER — Other Ambulatory Visit: Payer: Medicare Other

## 2021-09-23 ENCOUNTER — Inpatient Hospital Stay: Payer: Medicare Other

## 2021-09-23 ENCOUNTER — Inpatient Hospital Stay: Payer: Medicare Other | Admitting: Physician Assistant

## 2021-09-23 ENCOUNTER — Telehealth: Payer: Self-pay | Admitting: Physician Assistant

## 2021-09-23 ENCOUNTER — Ambulatory Visit: Payer: Medicare Other | Admitting: Physician Assistant

## 2021-09-23 NOTE — Telephone Encounter (Signed)
Scheduled per sch msg called and left msg. Called 3 times.[name] going straight to voicemail

## 2021-09-29 ENCOUNTER — Ambulatory Visit: Payer: Medicare Other | Admitting: Physician Assistant

## 2021-09-29 ENCOUNTER — Other Ambulatory Visit: Payer: Medicare Other

## 2021-09-29 ENCOUNTER — Ambulatory Visit: Payer: Medicare Other

## 2021-09-30 ENCOUNTER — Emergency Department (HOSPITAL_COMMUNITY): Payer: Medicare Other

## 2021-09-30 ENCOUNTER — Inpatient Hospital Stay (HOSPITAL_COMMUNITY)
Admission: EM | Admit: 2021-09-30 | Discharge: 2021-10-09 | DRG: 271 | Disposition: A | Discharge status: Home / Self Care | Payer: Medicare Other | Attending: Internal Medicine | Admitting: Internal Medicine

## 2021-09-30 ENCOUNTER — Telehealth: Payer: Self-pay | Admitting: Internal Medicine

## 2021-09-30 ENCOUNTER — Encounter (HOSPITAL_COMMUNITY): Payer: Self-pay

## 2021-09-30 ENCOUNTER — Other Ambulatory Visit: Payer: Self-pay

## 2021-09-30 DIAGNOSIS — Z85118 Personal history of other malignant neoplasm of bronchus and lung: Secondary | ICD-10-CM

## 2021-09-30 DIAGNOSIS — B182 Chronic viral hepatitis C: Secondary | ICD-10-CM | POA: Diagnosis present

## 2021-09-30 DIAGNOSIS — I739 Peripheral vascular disease, unspecified: Secondary | ICD-10-CM | POA: Diagnosis not present

## 2021-09-30 DIAGNOSIS — E7800 Pure hypercholesterolemia, unspecified: Secondary | ICD-10-CM | POA: Diagnosis present

## 2021-09-30 DIAGNOSIS — C3491 Malignant neoplasm of unspecified part of right bronchus or lung: Secondary | ICD-10-CM | POA: Diagnosis present

## 2021-09-30 DIAGNOSIS — Z9689 Presence of other specified functional implants: Secondary | ICD-10-CM | POA: Diagnosis not present

## 2021-09-30 DIAGNOSIS — F17210 Nicotine dependence, cigarettes, uncomplicated: Secondary | ICD-10-CM | POA: Diagnosis present

## 2021-09-30 DIAGNOSIS — I110 Hypertensive heart disease with heart failure: Principal | ICD-10-CM | POA: Diagnosis present

## 2021-09-30 DIAGNOSIS — I5022 Chronic systolic (congestive) heart failure: Secondary | ICD-10-CM | POA: Diagnosis present

## 2021-09-30 DIAGNOSIS — I252 Old myocardial infarction: Secondary | ICD-10-CM

## 2021-09-30 DIAGNOSIS — Z452 Encounter for adjustment and management of vascular access device: Secondary | ICD-10-CM | POA: Diagnosis not present

## 2021-09-30 DIAGNOSIS — F32A Depression, unspecified: Secondary | ICD-10-CM | POA: Diagnosis present

## 2021-09-30 DIAGNOSIS — I70219 Atherosclerosis of native arteries of extremities with intermittent claudication, unspecified extremity: Secondary | ICD-10-CM | POA: Diagnosis not present

## 2021-09-30 DIAGNOSIS — I3131 Malignant pericardial effusion in diseases classified elsewhere: Secondary | ICD-10-CM | POA: Diagnosis not present

## 2021-09-30 DIAGNOSIS — F419 Anxiety disorder, unspecified: Secondary | ICD-10-CM | POA: Diagnosis present

## 2021-09-30 DIAGNOSIS — Z978 Presence of other specified devices: Secondary | ICD-10-CM | POA: Diagnosis not present

## 2021-09-30 DIAGNOSIS — I700 Atherosclerosis of aorta: Secondary | ICD-10-CM | POA: Diagnosis present

## 2021-09-30 DIAGNOSIS — R918 Other nonspecific abnormal finding of lung field: Secondary | ICD-10-CM | POA: Diagnosis not present

## 2021-09-30 DIAGNOSIS — Z20822 Contact with and (suspected) exposure to covid-19: Secondary | ICD-10-CM | POA: Diagnosis not present

## 2021-09-30 DIAGNOSIS — N400 Enlarged prostate without lower urinary tract symptoms: Secondary | ICD-10-CM | POA: Diagnosis present

## 2021-09-30 DIAGNOSIS — Z4682 Encounter for fitting and adjustment of non-vascular catheter: Secondary | ICD-10-CM | POA: Diagnosis not present

## 2021-09-30 DIAGNOSIS — I9589 Other hypotension: Secondary | ICD-10-CM | POA: Diagnosis present

## 2021-09-30 DIAGNOSIS — E063 Autoimmune thyroiditis: Secondary | ICD-10-CM | POA: Diagnosis not present

## 2021-09-30 DIAGNOSIS — I255 Ischemic cardiomyopathy: Secondary | ICD-10-CM | POA: Diagnosis present

## 2021-09-30 DIAGNOSIS — I5020 Unspecified systolic (congestive) heart failure: Secondary | ICD-10-CM | POA: Diagnosis not present

## 2021-09-30 DIAGNOSIS — I959 Hypotension, unspecified: Secondary | ICD-10-CM | POA: Diagnosis not present

## 2021-09-30 DIAGNOSIS — R9431 Abnormal electrocardiogram [ECG] [EKG]: Secondary | ICD-10-CM

## 2021-09-30 DIAGNOSIS — E876 Hypokalemia: Secondary | ICD-10-CM | POA: Diagnosis not present

## 2021-09-30 DIAGNOSIS — Z89511 Acquired absence of right leg below knee: Secondary | ICD-10-CM

## 2021-09-30 DIAGNOSIS — J90 Pleural effusion, not elsewhere classified: Secondary | ICD-10-CM

## 2021-09-30 DIAGNOSIS — D649 Anemia, unspecified: Secondary | ICD-10-CM | POA: Diagnosis not present

## 2021-09-30 DIAGNOSIS — E039 Hypothyroidism, unspecified: Secondary | ICD-10-CM | POA: Diagnosis present

## 2021-09-30 DIAGNOSIS — Z86010 Personal history of colonic polyps: Secondary | ICD-10-CM

## 2021-09-30 DIAGNOSIS — Z7982 Long term (current) use of aspirin: Secondary | ICD-10-CM

## 2021-09-30 DIAGNOSIS — R0902 Hypoxemia: Secondary | ICD-10-CM | POA: Diagnosis not present

## 2021-09-30 DIAGNOSIS — Z9861 Coronary angioplasty status: Secondary | ICD-10-CM

## 2021-09-30 DIAGNOSIS — J449 Chronic obstructive pulmonary disease, unspecified: Secondary | ICD-10-CM | POA: Diagnosis not present

## 2021-09-30 DIAGNOSIS — G894 Chronic pain syndrome: Secondary | ICD-10-CM | POA: Diagnosis not present

## 2021-09-30 DIAGNOSIS — Z95810 Presence of automatic (implantable) cardiac defibrillator: Secondary | ICD-10-CM | POA: Diagnosis not present

## 2021-09-30 DIAGNOSIS — R0789 Other chest pain: Secondary | ICD-10-CM | POA: Diagnosis not present

## 2021-09-30 DIAGNOSIS — J439 Emphysema, unspecified: Secondary | ICD-10-CM | POA: Diagnosis present

## 2021-09-30 DIAGNOSIS — G8929 Other chronic pain: Secondary | ICD-10-CM | POA: Diagnosis not present

## 2021-09-30 DIAGNOSIS — I3139 Other pericardial effusion (noninflammatory): Secondary | ICD-10-CM

## 2021-09-30 DIAGNOSIS — Z888 Allergy status to other drugs, medicaments and biological substances status: Secondary | ICD-10-CM

## 2021-09-30 DIAGNOSIS — I2510 Atherosclerotic heart disease of native coronary artery without angina pectoris: Secondary | ICD-10-CM | POA: Diagnosis not present

## 2021-09-30 DIAGNOSIS — I509 Heart failure, unspecified: Secondary | ICD-10-CM | POA: Diagnosis not present

## 2021-09-30 DIAGNOSIS — G40909 Epilepsy, unspecified, not intractable, without status epilepticus: Secondary | ICD-10-CM | POA: Diagnosis present

## 2021-09-30 DIAGNOSIS — J984 Other disorders of lung: Secondary | ICD-10-CM | POA: Diagnosis not present

## 2021-09-30 DIAGNOSIS — R911 Solitary pulmonary nodule: Secondary | ICD-10-CM | POA: Diagnosis not present

## 2021-09-30 DIAGNOSIS — I319 Disease of pericardium, unspecified: Secondary | ICD-10-CM | POA: Diagnosis not present

## 2021-09-30 DIAGNOSIS — R079 Chest pain, unspecified: Secondary | ICD-10-CM | POA: Diagnosis not present

## 2021-09-30 DIAGNOSIS — C3490 Malignant neoplasm of unspecified part of unspecified bronchus or lung: Secondary | ICD-10-CM | POA: Diagnosis not present

## 2021-09-30 DIAGNOSIS — Z79899 Other long term (current) drug therapy: Secondary | ICD-10-CM

## 2021-09-30 DIAGNOSIS — I10 Essential (primary) hypertension: Secondary | ICD-10-CM | POA: Diagnosis not present

## 2021-09-30 DIAGNOSIS — Z86718 Personal history of other venous thrombosis and embolism: Secondary | ICD-10-CM

## 2021-09-30 DIAGNOSIS — M6281 Muscle weakness (generalized): Secondary | ICD-10-CM | POA: Diagnosis not present

## 2021-09-30 DIAGNOSIS — R001 Bradycardia, unspecified: Secondary | ICD-10-CM | POA: Diagnosis not present

## 2021-09-30 DIAGNOSIS — Z79890 Hormone replacement therapy (postmenopausal): Secondary | ICD-10-CM

## 2021-09-30 LAB — CBC WITH DIFFERENTIAL/PLATELET
Abs Immature Granulocytes: 0.15 10*3/uL — ABNORMAL HIGH (ref 0.00–0.07)
Basophils Absolute: 0.1 10*3/uL (ref 0.0–0.1)
Basophils Relative: 1 %
Eosinophils Absolute: 0.1 10*3/uL (ref 0.0–0.5)
Eosinophils Relative: 0 %
HCT: 35.8 % — ABNORMAL LOW (ref 39.0–52.0)
Hemoglobin: 11.5 g/dL — ABNORMAL LOW (ref 13.0–17.0)
Immature Granulocytes: 1 %
Lymphocytes Relative: 9 %
Lymphs Abs: 1 10*3/uL (ref 0.7–4.0)
MCH: 32.3 pg (ref 26.0–34.0)
MCHC: 32.1 g/dL (ref 30.0–36.0)
MCV: 100.6 fL — ABNORMAL HIGH (ref 80.0–100.0)
Monocytes Absolute: 1.4 10*3/uL — ABNORMAL HIGH (ref 0.1–1.0)
Monocytes Relative: 12 %
Neutro Abs: 8.6 10*3/uL — ABNORMAL HIGH (ref 1.7–7.7)
Neutrophils Relative %: 77 %
Platelets: 179 10*3/uL (ref 150–400)
RBC: 3.56 MIL/uL — ABNORMAL LOW (ref 4.22–5.81)
RDW: 15.7 % — ABNORMAL HIGH (ref 11.5–15.5)
WBC: 11.2 10*3/uL — ABNORMAL HIGH (ref 4.0–10.5)
nRBC: 0 % (ref 0.0–0.2)

## 2021-09-30 LAB — RESP PANEL BY RT-PCR (FLU A&B, COVID) ARPGX2
Influenza A by PCR: NEGATIVE
Influenza B by PCR: NEGATIVE
SARS Coronavirus 2 by RT PCR: NEGATIVE

## 2021-09-30 LAB — COMPREHENSIVE METABOLIC PANEL
ALT: 20 U/L (ref 0–44)
AST: 19 U/L (ref 15–41)
Albumin: 3.4 g/dL — ABNORMAL LOW (ref 3.5–5.0)
Alkaline Phosphatase: 87 U/L (ref 38–126)
Anion gap: 5 (ref 5–15)
BUN: 13 mg/dL (ref 6–20)
CO2: 29 mmol/L (ref 22–32)
Calcium: 8.3 mg/dL — ABNORMAL LOW (ref 8.9–10.3)
Chloride: 97 mmol/L — ABNORMAL LOW (ref 98–111)
Creatinine, Ser: 0.84 mg/dL (ref 0.61–1.24)
GFR, Estimated: >60 mL/min (ref >60)
Glucose, Bld: 98 mg/dL (ref 70–99)
Potassium: 2.9 mmol/L — ABNORMAL LOW (ref 3.5–5.1)
Sodium: 131 mmol/L — ABNORMAL LOW (ref 135–145)
Total Bilirubin: 0.7 mg/dL (ref 0.3–1.2)
Total Protein: 6.6 g/dL (ref 6.5–8.1)

## 2021-09-30 LAB — TROPONIN I (HIGH SENSITIVITY): Troponin I (High Sensitivity): 7 ng/L (ref <18)

## 2021-09-30 LAB — PROTIME-INR
INR: 1 (ref 0.8–1.2)
Prothrombin Time: 13.4 seconds (ref 11.4–15.2)

## 2021-09-30 LAB — MAGNESIUM: Magnesium: 1.5 mg/dL — ABNORMAL LOW (ref 1.7–2.4)

## 2021-09-30 LAB — BRAIN NATRIURETIC PEPTIDE: B Natriuretic Peptide: 108.2 pg/mL — ABNORMAL HIGH (ref 0.0–100.0)

## 2021-09-30 LAB — LACTIC ACID, PLASMA
Lactic Acid, Venous: 0.7 mmol/L (ref 0.5–1.9)
Lactic Acid, Venous: 0.6 mmol/L (ref 0.5–1.9)

## 2021-09-30 MED ORDER — SODIUM CHLORIDE 0.9 % IV BOLUS
1000.0000 mL | Freq: Once | INTRAVENOUS | Status: AC
  Administered 2021-09-30: 1000 mL via INTRAVENOUS

## 2021-09-30 MED ORDER — POTASSIUM CHLORIDE CRYS ER 20 MEQ PO TBCR
40.0000 meq | EXTENDED_RELEASE_TABLET | Freq: Once | ORAL | Status: AC
  Administered 2021-09-30: 40 meq via ORAL
  Filled 2021-09-30: qty 2

## 2021-09-30 MED ORDER — AMITRIPTYLINE HCL 50 MG PO TABS
100.0000 mg | ORAL_TABLET | Freq: Every day | ORAL | Status: DC
  Administered 2021-09-30 – 2021-10-08 (×9): 100 mg via ORAL
  Filled 2021-09-30 (×2): qty 4
  Filled 2021-09-30 (×3): qty 2
  Filled 2021-09-30: qty 4
  Filled 2021-09-30 (×3): qty 2

## 2021-09-30 MED ORDER — POTASSIUM CHLORIDE 10 MEQ/100ML IV SOLN
10.0000 meq | INTRAVENOUS | Status: AC
  Administered 2021-09-30 – 2021-10-01 (×3): 10 meq via INTRAVENOUS
  Filled 2021-09-30 (×2): qty 100

## 2021-09-30 MED ORDER — MAGNESIUM SULFATE 2 GM/50ML IV SOLN
2.0000 g | Freq: Once | INTRAVENOUS | Status: AC
  Administered 2021-10-01: 2 g via INTRAVENOUS
  Filled 2021-09-30: qty 50

## 2021-09-30 MED ORDER — IOHEXOL 350 MG/ML SOLN
80.0000 mL | Freq: Once | INTRAVENOUS | Status: AC | PRN
  Administered 2021-09-30: 80 mL via INTRAVENOUS

## 2021-09-30 MED ORDER — SODIUM CHLORIDE 0.9 % IV BOLUS: 500.0000 mL | Freq: Once | INTRAVENOUS | Status: DC

## 2021-09-30 NOTE — Telephone Encounter (Signed)
Spoke with pt about scheduling appts. Pt wife informed us she was considering calling emergency services due to pt being in pain, ask desk nurse to ensure pt go to emergency room. Appts not scheduled.

## 2021-09-30 NOTE — ED Provider Notes (Signed)
[facility] DEPT Provider Note   CSN: [id number] Arrival date & time: 09/30/21  1803     History  Chief Complaint  Patient presents with   Chest Pain    HALDEN PHEGLEY is a 61 y.o. male.   Chest Pain   61 year old male   Has extensive pulmonoary comorbidities such as NSCLC, NSTEMI, Emphysema here with 4 days of pleuritic CP.  Pain worse with taking deep breath.  No report of congestion or sob.  No fever, chills, night sweats. Currently on chemotherapy.      Home Medications Prior to Admission medications   Medication Sig Start Date End Date Taking? Authorizing Provider  albuterol (VENTOLIN HFA) 108 (90 Base) MCG/ACT inhaler Inhale 1-2 puffs into the lungs every 4 (four) hours as needed for wheezing or shortness of breath. 06/30/20   [provider]  alprazolam [name]) 2 MG tablet Take 2 mg by mouth 4 (four) times daily as needed for anxiety.    [provider]  amitriptyline (ELAVIL) 100 MG tablet Take 100 mg by mouth at bedtime.     [provider]  aspirin 81 MG EC tablet Take 81 mg by mouth daily.    [provider]  atorvastatin (LIPITOR) 80 MG tablet Take 80 mg by mouth daily. 06/06/21   [provider]  citalopram (CELEXA) 40 MG tablet Take 40 mg by mouth daily. 12/02/14   [provider]  dexamethasone (DECADRON) 4 MG tablet Please take 1 tablet twice a day the day before, the day of, and the day after treatment 06/16/21   Heilingoetter, Cassandra L, PA-C  folic acid (FOLVITE) 1 MG tablet Take 1 tablet (1 mg total) by mouth daily. 02/24/21   Heilingoetter, Cassandra L, PA-C  furosemide (LASIX) 20 MG tablet Take 1 tablet (20 mg total) by mouth daily for 4 days only. 09/22/21   Camnitz, Ocie Doyne, MD  HYDROcodone-acetaminophen (NORCO) 10-325 MG tablet Take 1 tablet by mouth every 4 (four) hours as needed for moderate pain.  06/22/16   [provider]  levothyroxine (SYNTHROID) 125 MCG  tablet Take 125 mcg by mouth daily. 12/04/20   [provider]  lidocaine-prilocaine (EMLA) cream Apply 1 application topically as needed. 05/05/21   Curt Bears, MD  lisinopril (ZESTRIL) 2.5 MG tablet Take 2.5 mg by mouth daily. 08/03/21   [provider]  midodrine (PROAMATINE) 10 MG tablet Take 1 tablet (10 mg total) by mouth 3 (three) times daily with meals. 03/29/21   Ghimire, Henreitta Leber, MD  nitroGLYCERIN (NITROSTAT) 0.4 MG SL tablet Place 1 tablet (0.4 mg total) under the tongue every 5 (five) minutes x 3 doses as needed for chest pain. Patient not taking: Reported on 09/01/2021 05/28/16   [name], NP  prochlorperazine (COMPAZINE) 10 MG tablet Take 1 tablet (10 mg total) by mouth every 6 (six) hours as needed for nausea or vomiting. Patient not taking: Reported on 09/01/2021 02/24/21   Heilingoetter, Cassandra L, PA-C  sucralfate (CARAFATE) 1 g tablet Take 1 tablet (1 g total) by mouth 4 (four) times daily -  with meals and at bedtime. 5 min before meals for radiation induced esophagitis 04/14/18   Tyler Pita, MD  tamsulosin (FLOMAX) 0.4 MG CAPS capsule Take 0.4 mg by mouth daily. 01/29/18   [provider]  TRELEGY ELLIPTA 100-62.5-25 MCG/INH AEPB Inhale 1 puff into the lungs daily. 12/13/20   [provider]      Allergies  Lyrica [pregabalin] and Neurontin [gabapentin]    Review of Systems   Review of Systems  Cardiovascular:  Positive for chest pain.   Physical Exam Updated Vital Signs BP (!) 134/119 (BP Location: Left Arm)    Pulse 88    Temp 98.9 F (37.2 C) (Oral)    Resp 12    Ht 6' (1.829 m)    Wt 83 kg    SpO2 98%    BMI 24.82 kg/m  Physical Exam Vitals and nursing note reviewed.  Constitutional:      General: He is not in acute distress.    Appearance: He is well-developed.  HENT:     Head: Atraumatic.  Eyes:     Conjunctiva/sclera: Conjunctivae normal.  Cardiovascular:     Rate and Rhythm: Tachycardia present.     Pulses:  Normal pulses.     Heart sounds: Normal heart sounds.  Pulmonary:     Effort: Pulmonary effort is normal.     Breath sounds: Normal breath sounds. No wheezing, rhonchi or rales.  Abdominal:     Palpations: Abdomen is soft.     Tenderness: There is no abdominal tenderness.  Musculoskeletal:     Cervical back: Neck supple.     Right lower leg: No edema.     Left lower leg: No edema.     Comments: Right BKA with normal-appearing stump  Skin:    Findings: No rash.  Neurological:     Mental Status: He is alert. Mental status is at baseline.  Psychiatric:        Mood and Affect: Mood normal.    ED Results / Procedures / Treatments   Labs (all labs ordered are listed, but only abnormal results are displayed) Labs Reviewed  BRAIN NATRIURETIC PEPTIDE - Abnormal; Notable for the following components:      Result Value   B Natriuretic Peptide 108.2 (*)    All other components within normal limits  CBC WITH DIFFERENTIAL/PLATELET - Abnormal; Notable for the following components:   WBC 11.2 (*)    RBC 3.56 (*)    Hemoglobin 11.5 (*)    HCT 35.8 (*)    MCV 100.6 (*)    RDW 15.7 (*)    Neutro Abs 8.6 (*)    Monocytes Absolute 1.4 (*)    Abs Immature Granulocytes 0.15 (*)    All other components within normal limits  COMPREHENSIVE METABOLIC PANEL - Abnormal; Notable for the following components:   Sodium 131 (*)    Potassium 2.9 (*)    Chloride 97 (*)    Calcium 8.3 (*)    Albumin 3.4 (*)    All other components within normal limits  RESP PANEL BY RT-PCR (FLU A&B, COVID) ARPGX2  CULTURE, BLOOD (ROUTINE X 2)  CULTURE, BLOOD (ROUTINE X 2)  LACTIC ACID, PLASMA  PROTIME-INR  LACTIC ACID, PLASMA  URINALYSIS, ROUTINE W REFLEX MICROSCOPIC  MAGNESIUM  TROPONIN I (HIGH SENSITIVITY)    EKG EKG Interpretation  Date/Time:  Wednesday September 30 2021 18:46:42 EST Ventricular Rate:  86 PR Interval:  162 QRS Duration: 102 QT Interval:  427 QTC Calculation: 511 R Axis:   28 Text  Interpretation: Sinus rhythm Inferior infarct, old Anterior infarct, old Prolonged QT interval Non-specific intra-ventricular conduction delay resolved since last tracing Confirmed by Dorie Rank [phone]) on 09/30/2021 6:49:45 PM  Radiology CT Angio Chest PE W and/or Wo Contrast  Result Date: 09/30/2021 CLINICAL DATA:  Intermittent sharp chest pain for the last 4 days, lung  cancer EXAM: CT ANGIOGRAPHY CHEST WITH CONTRAST TECHNIQUE: Multidetector CT imaging of the chest was performed using the standard protocol during bolus administration of intravenous contrast. Multiplanar CT image reconstructions and MIPs were obtained to evaluate the vascular anatomy. CONTRAST:  74mL OMNIPAQUE IOHEXOL 350 MG/ML SOLN COMPARISON:  07/23/2021 FINDINGS: Cardiovascular: This is a technically adequate evaluation of the pulmonary vasculature. No filling defects or pulmonary emboli. Large pericardial is effusion is identified, measuring up to 2.3 cm in thickness. The heart is not enlarged. Dual lead pacer is identified. No evidence of thoracic aortic aneurysm or dissection. Atherosclerosis of the aorta and coronary vasculature. Mediastinum/Nodes: No enlarged mediastinal, hilar, or axillary lymph nodes. Thyroid gland, trachea, and esophagus demonstrate no significant findings. Lungs/Pleura: Stable post therapeutic changes within the right upper lobe, with continued cavitary lesion measuring 4.9 x 3.0 cm. Dense fibrosis and consolidation within the right upper lobe unchanged. Stable 0.8 cm left upper lobe ground-glass pulmonary nodule. Continued bilateral bronchial wall thickening, right greater than left. Trace bilateral pleural fluid. No pneumothorax. Upper Abdomen: No acute abnormality. Musculoskeletal: No acute or destructive bony lesions. Reconstructed images demonstrate no additional findings. Review of the MIP images confirms the above findings. IMPRESSION: 1. No evidence of pulmonary embolus. 2. Stable post therapeutic changes  within the right upper lobe, with fibrosis, dense consolidation, and cavitation as above. If recurrent or residual disease is suspected, PET CT could be performed. 3. Stable nonspecific 0.8 cm ground-glass left upper lobe pulmonary nodule. 4. Large pericardial effusion, increased since prior study. 5. Trace bilateral pleural fluid. 6.  Aortic Atherosclerosis (ICD10-I70.0). Electronically Signed   By: Randa Ngo M.D.   On: 09/30/2021 20:37    Procedures .Critical Care Performed by: Domenic Moras, PA-C Authorized by: Domenic Moras, PA-C   Critical care provider statement:    Critical care time (minutes):  30   Critical care was necessary to treat or prevent imminent or life-threatening deterioration of the following conditions:  Cardiac failure   Critical care was time spent personally by me on the following activities:  Development of treatment plan with patient or surrogate, discussions with consultants, evaluation of patient's response to treatment, examination of patient, ordering and review of laboratory studies, ordering and review of radiographic studies, ordering and performing treatments and interventions, pulse oximetry, re-evaluation of patient's condition and review of old charts    Medications Ordered in ED Medications  potassium chloride SA (KLOR-CON M) CR tablet 40 mEq (has no administration in time range)  potassium chloride 10 mEq in 100 mL IVPB (has no administration in time range)  amitriptyline (ELAVIL) tablet 100 mg (has no administration in time range)  sodium chloride 0.9 % bolus 1,000 mL (0 mLs Intravenous Stopped 09/30/21 2204)  iohexol (OMNIPAQUE) 350 MG/ML injection 80 mL (80 mLs Intravenous Contrast Given 09/30/21 2020)    ED Course/ Medical Decision Making/ A&P                           Medical Decision Making  BP (!) 81/67    Pulse 87    Temp 98.9 F (37.2 C) (Oral)    Resp 12    Ht 6' (1.829 m)    Wt 83 kg    SpO2 98%    BMI 24.82 kg/m   This is a 61 year old  male with significant history of recurrent non-small cell lung cancer stage IIIa who is currently receiving chemotherapy who has a large right upper lung lobe mass with  chest wall invasion, being cared by oncologist, Dr. Julien Nordmann.  He is here with complaint of pleuritic pain to his right chest ongoing for the past 3 to 4 days.  I have review patient's previous notes from his oncologist.  I felt that his pain is likely related to his cancer.  He does have history of cardiac disease however his symptom is not suggestive of ACS.  Will obtain EKG and troponin.  Work-up initiated, will also obtain chest CT angiogram to rule out PE and further assess the disease progressions of his lung cancer.  His vital signs are significant for hypertension with blood pressure of 81/67.  He is not tachycardic and is afebrile.  Although sepsis is less likely, in a patient that his immunocompromise state such as his, I will obtain his labs and to assess for sepsis as well. IVF ordered.   8:56 PM EKG independently reviewed and interpreted by me showing no acute ischemic changes.  Labs remarkable for hypokalemia with a potassium of 2.9.  Will provide supplementation and will also check magnesium level.  WBC 11.2.  Viral respiratory panel was negative for COVID and flu.  BNP is mildly elevated at 108, similar to prior.  Normal lactic acid.  A chest CT angiogram was performed which shows no evidence of pulmonary embolus.  However, CT did demonstrate a large pericardial effusion, increased since prior study.  I appreciate consultation from on-call cardiologist, Dr. Haroldine Laws, who did review patient's CT scan.  He felt the patient would best benefit from hospital admission for a echocardiogram and for cardiology to be consulted tomorrow to determine the next step.  I have placed an order for cardiac echo and will consult medicine for admission.  Patient also received IV fluid for his low blood pressure.  Cardiologist felt with a  noncollapse RV and patient is not tachycardic, it is less likely that he is in cardiac tamponade.  At this time patient is mentating appropriately and pain appears to be under control.  Patient made aware of findings and agrees with plan  10:19 PM Appreciate consultation from [facility] Dr. Posey Pronto who agrees to see and will admit pt.  Cardiology will need to be consult tomorrow for further care. BP did improve with IVF.  Current systolic BP is 90        Final Clinical Impression(s) / ED Diagnoses Final diagnoses:  Pericardial effusion  Hypotension, unspecified hypotension type    Rx / DC Orders ED Discharge Orders     None         Domenic Moras, PA-C 09/30/21 2221    Dorie Rank, MD 10/01/21 1017

## 2021-09-30 NOTE — H&P (Signed)
History and Physical    [name] YDX:[id number] DOB: May 24, 1961 DOA: 09/30/2021  PCP: Redmond School, MD  Patient coming from: Home  I have personally briefly reviewed patient's old medical records in [place]  Chief Complaint: Chest pain  HPI: [name] is a 61 y.o. male with medical history significant for CAD s/p PCI, ischemic cardiomyopathy, HFrEF (EF 25-30% by TTE 03/24/2021) s/p AICD, recurrent non-small cell lung cancer (on active therapy with carboplatin and Alimta), COPD, PAD s/p right BKA, hypothyroidism, HLD, BPH who presented to the ED for evaluation of pleuritic chest pain.  Patient reports at least 4 days of pleuritic chest pain.  Pain occurs with deep inspiration or when coughing.  He reports an ongoing cough for the last year productive of large amount of clear sputum.  He gets occasionally lightheaded.  He denies any nausea, vomiting, abdominal pain, dysuria, or swelling in his lower extremity.  ED Course:  Initial vitals showed BP 81/63, MAP 70, pulse 88, RR 12, temp 98.9 F, SPO2 98% on room air.  Labs show WBC 11.2, hemoglobin 11.5, platelets 179,000, sodium 131, potassium 2.9, bicarb 29, BUN 13, creatinine 0.84, serum glucose 98, LFTs within normal limits, lactic acid 0.7, BNP 108.2.  SARS-CoV-2 and influenza PCR negative.  Blood cultures collected and pending.  CTA chest PE study shows a large pericardial effusion, increased compared to prior study.  No evidence of PE.  Stable post therapeutic changes within the right upper lobe with fibrosis, dense consolidation, and cavitation noted.  Stable nonspecific 0.8 cm groundglass left upper lobe pulmonary nodule and trace bilateral pleural fluid also seen.  Patient was given 1L normal saline, oral potassium 40 mEq, IV potassium 10 mEq x 3.  EDP discussed with on-call cardiology, Dr. Haroldine Laws, who recommended medical admission to [facility] for echocardiogram and cardiology consultation in the morning.   Echocardiogram was ordered.  The hospitalist service was consulted to admit for further evaluation and management.  Review of Systems: All systems reviewed and are negative except as documented in history of present illness above.   Past Medical History:  Diagnosis Date   Acute hepatitis C virus infection 06/19/2007   Qualifier: Diagnosis of  By: [name] CMA, Tiffany     Acute ST elevation myocardial infarction (STEMI) involving left anterior descending (LAD) coronary artery (HCC) 05/26/2016   Acute ST elevation myocardial infarction (STEMI) of lateral wall (HCC) 05/26/2016   AICD (automatic cardioverter/defibrillator) present 03/02/2018   Anxiety    Asthma    Atherosclerosis of native arteries of the extremities with intermittent claudication 12/16/2011   Chronic hepatitis C without hepatic coma ([place]) 05/03/2014   COPD with chronic bronchitis and emphysema ([place]) 03/13/2018   FEV1 57%   Depression    DVT (deep venous thrombosis) ([place]) 2009; 2019   RLE; LLE   Encounter for antineoplastic chemotherapy 03/22/2018   Hepatitis C    "tx'd in 2015"   High cholesterol    Ischemic cardiomyopathy 03/02/2018   Non-small cell carcinoma of right lung, stage 3 ([place]) 03/22/2018   PAD (peripheral artery disease) ([place]) 01/25/2013   Peripheral vascular disease, unspecified 07/20/2012   Port-A-Cath in place 04/24/2018   ST elevation myocardial infarction involving left anterior descending (LAD) coronary artery (HCC)    STEMI (ST elevation myocardial infarction) ([place]) 05/26/2016   Tobacco abuse 01/28/2016   Tubular adenoma of colon 07/11/2014   Urinary dribbling     Past Surgical History:  Procedure Laterality Date   AORTOGRAM  08/08/2009  for LLE claudication     By Dr. 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   BIOPSY N/A 05/30/2014   Procedure: BIOPSY;  Surgeon: [name], MD;  Location: [facility];  Service: Endoscopy;  Laterality: N/A;   BLADDER TUMOR EXCISION  8/812   CARDIAC CATHETERIZATION  N/A 05/26/2016   Procedure: Left Heart Cath and Coronary Angiography;  Surgeon: [name], MD;  Location: [place] [facility];  Service: Cardiovascular;  Laterality: N/A;   CARDIAC CATHETERIZATION N/A 05/26/2016   Procedure: Coronary Stent Intervention;  Surgeon: [name], MD;  Location: [place] [facility];  Service: Cardiovascular;  Laterality: N/A;   COLONOSCOPY WITH PROPOFOL N/A 05/30/2014   ENI:DPOEUM colonic polyp-likely source of hematochezia-removed as described above   ESOPHAGOGASTRODUODENOSCOPY (EGD) WITH PROPOFOL N/A 05/30/2014   PNT:IRWERX and bulbar erosions s/p gastric biopsy. No evidence of portal gastropathy on today's examination.   FEMORAL-TIBIAL BYPASS GRAFT  2009   Right side using non-reversed GSV   By Dr. Anson Oregon BYPASS GRAFT  11/24/2007   Right femoral to anterior tibial BPG   by Dr. Oneida Alar   FINGER SURGERY Left    "straightened my pinky"   HAND TENDON SURGERY Left 2013   Left 5th finger   HERNIA REPAIR     "stomach"   ICD IMPLANT N/A 03/02/2018   Procedure: ICD IMPLANT;  Surgeon: [name], MD;  Location: [place] [facility];  Service: Cardiovascular;  Laterality: N/A;   INCISIONAL HERNIA REPAIR N/A 12/25/2014   Procedure: HERNIA REPAIR INCISIONAL WITH MESH;  Surgeon: [name], MD;  Location: [facility];  Service: General;  Laterality: N/A;   INSERTION OF MESH N/A 12/25/2014   Procedure: INSERTION OF MESH;  Surgeon: [name], MD;  Location: [facility];  Service: General;  Laterality: N/A;   IR IMAGING GUIDED PORT INSERTION  04/11/2018   LAPAROSCOPIC CHOLECYSTECTOMY     LOWER EXTREMITY ANGIOGRAM Left 12/30/2015   Procedure: Lower Extremity Angiogram;  Surgeon: [name], MD;  Location: [place] [facility];  Service: Cardiovascular;  Laterality: Left;   PERIPHERAL VASCULAR CATHETERIZATION N/A 12/30/2015   Procedure: Abdominal Aortogram;  Surgeon: [name], MD;  Location: [place] [facility];  Service: Cardiovascular;   Laterality: N/A;   POLYPECTOMY  05/30/2014   Procedure: POLYPECTOMY;  Surgeon: [name], MD;  Location: [facility];  Service: Endoscopy;;   TONSILLECTOMY AND ADENOIDECTOMY  ~ 1970   TYMPANOSTOMY TUBE PLACEMENT Bilateral ~ 1970    Social History:  reports that he has been smoking cigarettes. He has never used smokeless tobacco. He reports that he does not currently use alcohol after a past usage of about 18.0 standard drinks per week. He reports current drug use. Drug: Marijuana.  Allergies  Allergen Reactions   Lyrica [Pregabalin] Palpitations    Chest pain and heart palps.   Neurontin [Gabapentin] Palpitations    Chest pain and heart palp    Family History  Problem Relation Age of Onset   Vision loss Mother    Ulcers Father    Colon cancer Neg Hx      Prior to Admission medications   Medication Sig Start Date End Date Taking? Authorizing Provider  albuterol (VENTOLIN HFA) 108 (90 Base) MCG/ACT inhaler Inhale 1-2 puffs into the lungs every 4 (four) hours as needed for wheezing or shortness of breath. 06/30/20   [provider]  alprazolam [name]) 2 MG tablet Take 2 mg by  mouth 4 (four) times daily as needed for anxiety.    [provider]  amitriptyline (ELAVIL) 100 MG tablet Take 100 mg by mouth at bedtime.     [provider]  aspirin 81 MG EC tablet Take 81 mg by mouth daily.    [provider]  atorvastatin (LIPITOR) 80 MG tablet Take 80 mg by mouth daily. 06/06/21   [provider]  citalopram (CELEXA) 40 MG tablet Take 40 mg by mouth daily. 12/02/14   [provider]  dexamethasone (DECADRON) 4 MG tablet Please take 1 tablet twice a day the day before, the day of, and the day after treatment 06/16/21   Heilingoetter, Cassandra L, PA-C  folic acid (FOLVITE) 1 MG tablet Take 1 tablet (1 mg total) by mouth daily. 02/24/21   Heilingoetter, Cassandra L, PA-C  furosemide (LASIX) 20 MG tablet Take 1 tablet (20 mg total) by mouth daily  for 4 days only. 09/22/21   Camnitz, Ocie Doyne, MD  HYDROcodone-acetaminophen (NORCO) 10-325 MG tablet Take 1 tablet by mouth every 4 (four) hours as needed for moderate pain.  06/22/16   [provider]  levothyroxine (SYNTHROID) 125 MCG tablet Take 125 mcg by mouth daily. 12/04/20   [provider]  lidocaine-prilocaine (EMLA) cream Apply 1 application topically as needed. 05/05/21   Curt Bears, MD  lisinopril (ZESTRIL) 2.5 MG tablet Take 2.5 mg by mouth daily. 08/03/21   [provider]  midodrine (PROAMATINE) 10 MG tablet Take 1 tablet (10 mg total) by mouth 3 (three) times daily with meals. 03/29/21   Ghimire, Henreitta Leber, MD  nitroGLYCERIN (NITROSTAT) 0.4 MG SL tablet Place 1 tablet (0.4 mg total) under the tongue every 5 (five) minutes x 3 doses as needed for chest pain. Patient not taking: Reported on 09/01/2021 05/28/16   [name], NP  prochlorperazine (COMPAZINE) 10 MG tablet Take 1 tablet (10 mg total) by mouth every 6 (six) hours as needed for nausea or vomiting. Patient not taking: Reported on 09/01/2021 02/24/21   Heilingoetter, Cassandra L, PA-C  sucralfate (CARAFATE) 1 g tablet Take 1 tablet (1 g total) by mouth 4 (four) times daily -  with meals and at bedtime. 5 min before meals for radiation induced esophagitis 04/14/18   Tyler Pita, MD  tamsulosin (FLOMAX) 0.4 MG CAPS capsule Take 0.4 mg by mouth daily. 01/29/18   [provider]  TRELEGY ELLIPTA 100-62.5-25 MCG/INH AEPB Inhale 1 puff into the lungs daily. 12/13/20   [provider]    Physical Exam: Vitals:   09/30/21 2130 09/30/21 2200 09/30/21 2202 09/30/21 2230  BP: 92/67 (!) 71/60 (!) 80/50 (!) 83/67  Pulse: 92 93 93 95  Resp: (!) 22 20 (!) 26 20  Temp:      TempSrc:      SpO2: 99% 98% 97% 96%  Weight:      Height:       Constitutional: Chronically ill-appearing man resting in bed with head slightly elevated.  NAD, calm, comfortable Eyes: PERRL, lids and conjunctivae  normal ENMT: Mucous membranes are dry. Posterior pharynx clear of any exudate or lesions.poor dentition.  Neck: normal, supple, no masses. Respiratory: clear to auscultation bilaterally, no wheezing, no crackles. Normal respiratory effort. No accessory muscle use.  Cardiovascular: Distant heart sounds. No extremity edema. Abdomen: no tenderness, no masses palpated. No hepatosplenomegaly. Bowel sounds positive.  Musculoskeletal: S/p right BKA.  No clubbing / cyanosis. No joint deformity upper and lower extremities. Good ROM, no contractures.  Normal muscle tone.  Skin: no rashes, lesions, ulcers. No induration Neurologic: CN 2-12 grossly intact. Sensation intact. Strength 5/5 in all 4.  Psychiatric: Alert and oriented x 3. Normal mood.   Labs on Admission: I have personally reviewed following labs and imaging studies  CBC: Recent Labs  Lab 09/30/21 1915  WBC 11.2*  NEUTROABS 8.6*  HGB 11.5*  HCT 35.8*  MCV 100.6*  PLT 008   Basic Metabolic Panel: Recent Labs  Lab 09/30/21 1915 09/30/21 2225  NA 131*  --   K 2.9*  --   CL 97*  --   CO2 29  --   GLUCOSE 98  --   BUN 13  --   CREATININE 0.84  --   CALCIUM 8.3*  --   MG  --  1.5*   GFR: Estimated Creatinine Clearance: 105.2 mL/min (by C-G formula based on SCr of 0.84 mg/dL). Liver Function Tests: Recent Labs  Lab 09/30/21 1915  AST 19  ALT 20  ALKPHOS 87  BILITOT 0.7  PROT 6.6  ALBUMIN 3.4*   No results for input(s): LIPASE, AMYLASE in the last 168 hours. No results for input(s): AMMONIA in the last 168 hours. Coagulation Profile: Recent Labs  Lab 09/30/21 1915  INR 1.0   Cardiac Enzymes: No results for input(s): CKTOTAL, CKMB, CKMBINDEX, TROPONINI in the last 168 hours. BNP (last 3 results) No results for input(s): PROBNP in the last 8760 hours. HbA1C: No results for input(s): HGBA1C in the last 72 hours. CBG: No results for input(s): GLUCAP in the last 168 hours. Lipid Profile: No results for input(s):  CHOL, HDL, LDLCALC, TRIG, CHOLHDL, LDLDIRECT in the last 72 hours. Thyroid Function Tests: No results for input(s): TSH, T4TOTAL, FREET4, T3FREE, THYROIDAB in the last 72 hours. Anemia Panel: No results for input(s): VITAMINB12, FOLATE, FERRITIN, TIBC, IRON, RETICCTPCT in the last 72 hours. Urine analysis:    Component Value Date/Time   COLORURINE YELLOW 01/26/2016 1435   APPEARANCEUR CLEAR 01/26/2016 1435   LABSPEC 1.011 01/26/2016 1435   PHURINE 7.0 01/26/2016 1435   GLUCOSEU NEGATIVE 01/26/2016 1435   HGBUR NEGATIVE 01/26/2016 1435   BILIRUBINUR NEGATIVE 01/26/2016 1435   KETONESUR NEGATIVE 01/26/2016 1435   PROTEINUR NEGATIVE 01/26/2016 1435   UROBILINOGEN 0.2 11/20/2007 1253   NITRITE NEGATIVE 01/26/2016 1435   LEUKOCYTESUR NEGATIVE 01/26/2016 1435    Radiological Exams on Admission: CT Angio Chest PE W and/or Wo Contrast  Result Date: 09/30/2021 CLINICAL DATA:  Intermittent sharp chest pain for the last 4 days, lung cancer EXAM: CT ANGIOGRAPHY CHEST WITH CONTRAST TECHNIQUE: Multidetector CT imaging of the chest was performed using the standard protocol during bolus administration of intravenous contrast. Multiplanar CT image reconstructions and MIPs were obtained to evaluate the vascular anatomy. CONTRAST:  43mL OMNIPAQUE IOHEXOL 350 MG/ML SOLN COMPARISON:  07/23/2021 FINDINGS: Cardiovascular: This is a technically adequate evaluation of the pulmonary vasculature. No filling defects or pulmonary emboli. Large pericardial is effusion is identified, measuring up to 2.3 cm in thickness. The heart is not enlarged. Dual lead pacer is identified. No evidence of thoracic aortic aneurysm or dissection. Atherosclerosis of the aorta and coronary vasculature. Mediastinum/Nodes: No enlarged mediastinal, hilar, or axillary lymph nodes. Thyroid gland, trachea, and esophagus demonstrate no significant findings. Lungs/Pleura: Stable post therapeutic changes within the right upper lobe, with continued  cavitary lesion measuring 4.9 x 3.0 cm. Dense fibrosis and consolidation within the right upper lobe unchanged. Stable 0.8 cm left upper lobe ground-glass pulmonary nodule. Continued bilateral bronchial  wall thickening, right greater than left. Trace bilateral pleural fluid. No pneumothorax. Upper Abdomen: No acute abnormality. Musculoskeletal: No acute or destructive bony lesions. Reconstructed images demonstrate no additional findings. Review of the MIP images confirms the above findings. IMPRESSION: 1. No evidence of pulmonary embolus. 2. Stable post therapeutic changes within the right upper lobe, with fibrosis, dense consolidation, and cavitation as above. If recurrent or residual disease is suspected, PET CT could be performed. 3. Stable nonspecific 0.8 cm ground-glass left upper lobe pulmonary nodule. 4. Large pericardial effusion, increased since prior study. 5. Trace bilateral pleural fluid. 6.  Aortic Atherosclerosis (ICD10-I70.0). Electronically Signed   By: Randa Ngo M.D.   On: 09/30/2021 20:37    EKG: Personally reviewed. Sinus rhythm, low voltage, QTC 511.  Similar to prior.  Assessment/Plan Principal Problem:   Pericardial effusion Active Problems:   PAD (peripheral artery disease) (HCC)   COPD with chronic bronchitis and emphysema (HCC)   Non-small cell carcinoma of right lung, stage 3 (HCC)   Hypotension   Hypothyroidism   Hypokalemia   Hypomagnesemia   HFrEF (heart failure with reduced ejection fraction) (HCC)   [name] is a 61 y.o. male with medical history significant for CAD s/p PCI, ischemic cardiomyopathy, HFrEF (EF 25-30% by TTE 03/24/2021) s/p AICD, recurrent non-small cell lung cancer (on active therapy with carboplatin and Alimta), COPD, PAD s/p right BKA, hypothyroidism, HLD, BPH who is admitted for evaluation of large pericardial effusion.  Large pericardial effusion HFrEF s/p AICD Ischemic cardiomyopathy: Large pericardial effusion noted on CT imaging.   Currently no evidence of tamponade.  Otherwise does not appear to be peripherally volume overloaded.  Lasix not started due to hypotension. -Obtain echocardiogram -Cardiology requesting formal consultation in a.m. -Hold further IV fluids -Monitor strict I/O's and daily weights -Keep on telemetry, admit to stepdown unit for now  CAD s/p PCI: Reports pleuritic chest pain but no typical cardiac pain.  Troponin is negative.  EKG similar to prior. -Continue aspirin and statin  Chronic hypotension: Continue home midodrine.  Holding lisinopril for now.  Hypokalemia/hypomagnesemia: Supplementing, repeat labs in AM.  COPD: Currently stable.  Continue Trelegy and albuterol as needed.  Recurrent non-small cell lung cancer: Follows with oncology, Dr. Curt Bears.  On active treatment with carboplatin and Alimta.  Hypothyroidism: Continue Synthroid.  PAD: S/p right BKA.  Continue atorvastatin and aspirin.  Hyperlipidemia: Continue atorvastatin.  Anxiety/depression: Continue Cymbalta, Elavil, and home Xanax as needed with hold parameters.  Chronic pain: Continue home Norco as needed with hold parameters.  DVT prophylaxis: Subcutaneous heparin Code Status: Full code, confirmed with patient on admission Family Communication: Discussed with patient, he has discussed with family Disposition Plan: From home, dispo pending clinical progress Consults called: Cardiology Level of care: Stepdown Admission status:   Status is: Observation  The patient remains OBS appropriate and will d/c before 2 midnights.  Zada Finders MD [facility]  If 7PM-7AM, please contact night-coverage [email or web address]  10/01/2021, 12:17 AM

## 2021-09-30 NOTE — ED Triage Notes (Addendum)
Patient BIB [name] for intermittent sharp chest pain for the last 4 days. EMS reports patient is currently being treated for lung CA. Patient comes to [facility] for treatment. Pt reported that pt took 2 nitroglycerin SL prior to arrival

## 2021-10-01 ENCOUNTER — Observation Stay (HOSPITAL_COMMUNITY): Payer: Medicare Other

## 2021-10-01 DIAGNOSIS — I5022 Chronic systolic (congestive) heart failure: Secondary | ICD-10-CM | POA: Diagnosis present

## 2021-10-01 DIAGNOSIS — N400 Enlarged prostate without lower urinary tract symptoms: Secondary | ICD-10-CM | POA: Diagnosis present

## 2021-10-01 DIAGNOSIS — Z4682 Encounter for fitting and adjustment of non-vascular catheter: Secondary | ICD-10-CM | POA: Diagnosis not present

## 2021-10-01 DIAGNOSIS — F419 Anxiety disorder, unspecified: Secondary | ICD-10-CM | POA: Diagnosis present

## 2021-10-01 DIAGNOSIS — F32A Depression, unspecified: Secondary | ICD-10-CM | POA: Diagnosis present

## 2021-10-01 DIAGNOSIS — F17210 Nicotine dependence, cigarettes, uncomplicated: Secondary | ICD-10-CM | POA: Diagnosis present

## 2021-10-01 DIAGNOSIS — I700 Atherosclerosis of aorta: Secondary | ICD-10-CM | POA: Diagnosis present

## 2021-10-01 DIAGNOSIS — I2510 Atherosclerotic heart disease of native coronary artery without angina pectoris: Secondary | ICD-10-CM

## 2021-10-01 DIAGNOSIS — Z9689 Presence of other specified functional implants: Secondary | ICD-10-CM | POA: Diagnosis not present

## 2021-10-01 DIAGNOSIS — I739 Peripheral vascular disease, unspecified: Secondary | ICD-10-CM | POA: Diagnosis present

## 2021-10-01 DIAGNOSIS — G8929 Other chronic pain: Secondary | ICD-10-CM | POA: Diagnosis present

## 2021-10-01 DIAGNOSIS — Z95810 Presence of automatic (implantable) cardiac defibrillator: Secondary | ICD-10-CM | POA: Diagnosis present

## 2021-10-01 DIAGNOSIS — J439 Emphysema, unspecified: Secondary | ICD-10-CM | POA: Diagnosis present

## 2021-10-01 DIAGNOSIS — D649 Anemia, unspecified: Secondary | ICD-10-CM | POA: Diagnosis not present

## 2021-10-01 DIAGNOSIS — I5020 Unspecified systolic (congestive) heart failure: Secondary | ICD-10-CM | POA: Diagnosis not present

## 2021-10-01 DIAGNOSIS — B182 Chronic viral hepatitis C: Secondary | ICD-10-CM | POA: Diagnosis present

## 2021-10-01 DIAGNOSIS — I9589 Other hypotension: Secondary | ICD-10-CM

## 2021-10-01 DIAGNOSIS — I509 Heart failure, unspecified: Secondary | ICD-10-CM | POA: Diagnosis not present

## 2021-10-01 DIAGNOSIS — G40909 Epilepsy, unspecified, not intractable, without status epilepticus: Secondary | ICD-10-CM | POA: Diagnosis present

## 2021-10-01 DIAGNOSIS — R918 Other nonspecific abnormal finding of lung field: Secondary | ICD-10-CM | POA: Diagnosis not present

## 2021-10-01 DIAGNOSIS — I3139 Other pericardial effusion (noninflammatory): Secondary | ICD-10-CM

## 2021-10-01 DIAGNOSIS — R9431 Abnormal electrocardiogram [ECG] [EKG]: Secondary | ICD-10-CM

## 2021-10-01 DIAGNOSIS — I255 Ischemic cardiomyopathy: Secondary | ICD-10-CM | POA: Diagnosis present

## 2021-10-01 DIAGNOSIS — Z20822 Contact with and (suspected) exposure to covid-19: Secondary | ICD-10-CM | POA: Diagnosis present

## 2021-10-01 DIAGNOSIS — I3131 Malignant pericardial effusion in diseases classified elsewhere: Secondary | ICD-10-CM | POA: Diagnosis not present

## 2021-10-01 DIAGNOSIS — I110 Hypertensive heart disease with heart failure: Secondary | ICD-10-CM | POA: Diagnosis present

## 2021-10-01 DIAGNOSIS — J90 Pleural effusion, not elsewhere classified: Secondary | ICD-10-CM | POA: Diagnosis not present

## 2021-10-01 DIAGNOSIS — C3491 Malignant neoplasm of unspecified part of right bronchus or lung: Secondary | ICD-10-CM | POA: Diagnosis present

## 2021-10-01 DIAGNOSIS — E039 Hypothyroidism, unspecified: Secondary | ICD-10-CM | POA: Diagnosis present

## 2021-10-01 DIAGNOSIS — E7800 Pure hypercholesterolemia, unspecified: Secondary | ICD-10-CM | POA: Diagnosis present

## 2021-10-01 DIAGNOSIS — E876 Hypokalemia: Secondary | ICD-10-CM | POA: Diagnosis present

## 2021-10-01 DIAGNOSIS — Z452 Encounter for adjustment and management of vascular access device: Secondary | ICD-10-CM | POA: Diagnosis not present

## 2021-10-01 DIAGNOSIS — Z7982 Long term (current) use of aspirin: Secondary | ICD-10-CM | POA: Diagnosis not present

## 2021-10-01 LAB — CBC
HCT: 34.4 % — ABNORMAL LOW (ref 39.0–52.0)
Hemoglobin: 11 g/dL — ABNORMAL LOW (ref 13.0–17.0)
MCH: 32.6 pg (ref 26.0–34.0)
MCHC: 32 g/dL (ref 30.0–36.0)
MCV: 102.1 fL — ABNORMAL HIGH (ref 80.0–100.0)
Platelets: 147 10*3/uL — ABNORMAL LOW (ref 150–400)
RBC: 3.37 MIL/uL — ABNORMAL LOW (ref 4.22–5.81)
RDW: 15.7 % — ABNORMAL HIGH (ref 11.5–15.5)
WBC: 7.7 10*3/uL (ref 4.0–10.5)
nRBC: 0 % (ref 0.0–0.2)

## 2021-10-01 LAB — BASIC METABOLIC PANEL
Anion gap: 7 (ref 5–15)
BUN: 9 mg/dL (ref 6–20)
CO2: 27 mmol/L (ref 22–32)
Calcium: 8.1 mg/dL — ABNORMAL LOW (ref 8.9–10.3)
Chloride: 101 mmol/L (ref 98–111)
Creatinine, Ser: 0.74 mg/dL (ref 0.61–1.24)
GFR, Estimated: >60 mL/min (ref >60)
Glucose, Bld: 111 mg/dL — ABNORMAL HIGH (ref 70–99)
Potassium: 3.6 mmol/L (ref 3.5–5.1)
Sodium: 135 mmol/L (ref 135–145)

## 2021-10-01 LAB — URINALYSIS, ROUTINE W REFLEX MICROSCOPIC
Bilirubin Urine: NEGATIVE
Glucose, UA: NEGATIVE mg/dL
Hgb urine dipstick: NEGATIVE
Ketones, ur: NEGATIVE mg/dL
Leukocytes,Ua: NEGATIVE
Nitrite: NEGATIVE
Protein, ur: NEGATIVE mg/dL
Specific Gravity, Urine: 1.015 (ref 1.005–1.030)
pH: 6 (ref 5.0–8.0)

## 2021-10-01 LAB — ECHOCARDIOGRAM COMPLETE
Calc EF: 47 %
Height: 72 in
S' Lateral: 3.3 cm
Single Plane A2C EF: 33.7 %
Single Plane A4C EF: 59 %
Weight: 2737.23 oz

## 2021-10-01 LAB — MRSA NEXT GEN BY PCR, NASAL: MRSA by PCR Next Gen: NOT DETECTED

## 2021-10-01 LAB — TSH: TSH: 15.414 u[IU]/mL — ABNORMAL HIGH (ref 0.350–4.500)

## 2021-10-01 LAB — MAGNESIUM: Magnesium: 1.6 mg/dL — ABNORMAL LOW (ref 1.7–2.4)

## 2021-10-01 MED ORDER — ATORVASTATIN CALCIUM 80 MG PO TABS
80.0000 mg | ORAL_TABLET | Freq: Every day | ORAL | Status: DC
  Administered 2021-10-01 – 2021-10-09 (×8): 80 mg via ORAL
  Filled 2021-10-01 (×2): qty 1
  Filled 2021-10-01 (×2): qty 2
  Filled 2021-10-01 (×5): qty 1

## 2021-10-01 MED ORDER — POTASSIUM CHLORIDE CRYS ER 20 MEQ PO TBCR
20.0000 meq | EXTENDED_RELEASE_TABLET | Freq: Two times a day (BID) | ORAL | Status: DC
  Administered 2021-10-01 – 2021-10-02 (×4): 20 meq via ORAL
  Filled 2021-10-01 (×4): qty 1

## 2021-10-01 MED ORDER — SUCRALFATE 1 G PO TABS
1.0000 g | ORAL_TABLET | Freq: Three times a day (TID) | ORAL | Status: DC
  Administered 2021-10-01 – 2021-10-09 (×33): 1 g via ORAL
  Filled 2021-10-01 (×33): qty 1

## 2021-10-01 MED ORDER — SENNOSIDES-DOCUSATE SODIUM 8.6-50 MG PO TABS: 1.0000 | ORAL_TABLET | Freq: Every evening | ORAL | Status: DC | PRN

## 2021-10-01 MED ORDER — SODIUM CHLORIDE 0.9% FLUSH
3.0000 mL | Freq: Two times a day (BID) | INTRAVENOUS | Status: DC
  Administered 2021-10-01 – 2021-10-08 (×8): 3 mL via INTRAVENOUS

## 2021-10-01 MED ORDER — FLUTICASONE FUROATE-VILANTEROL 100-25 MCG/ACT IN AEPB
1.0000 | INHALATION_SPRAY | Freq: Every day | RESPIRATORY_TRACT | Status: DC
  Administered 2021-10-01 – 2021-10-09 (×8): 1 via RESPIRATORY_TRACT
  Filled 2021-10-01: qty 28

## 2021-10-01 MED ORDER — POTASSIUM CHLORIDE 10 MEQ/100ML IV SOLN
INTRAVENOUS | Status: AC
  Filled 2021-10-01: qty 100

## 2021-10-01 MED ORDER — LEVOTHYROXINE SODIUM 25 MCG PO TABS
125.0000 ug | ORAL_TABLET | Freq: Every day | ORAL | Status: DC
  Administered 2021-10-01 – 2021-10-06 (×5): 125 ug via ORAL
  Filled 2021-10-01 (×5): qty 1

## 2021-10-01 MED ORDER — ASPIRIN EC 81 MG PO TBEC
81.0000 mg | DELAYED_RELEASE_TABLET | Freq: Every day | ORAL | Status: DC
  Administered 2021-10-01 – 2021-10-09 (×8): 81 mg via ORAL
  Filled 2021-10-01 (×8): qty 1

## 2021-10-01 MED ORDER — MAGNESIUM SULFATE 2 GM/50ML IV SOLN
2.0000 g | Freq: Once | INTRAVENOUS | Status: AC
Start: 2021-10-01 — End: 2021-10-01
  Administered 2021-10-01: 2 g via INTRAVENOUS
  Filled 2021-10-01: qty 50

## 2021-10-01 MED ORDER — ONDANSETRON HCL 4 MG/2ML IJ SOLN
4.0000 mg | Freq: Four times a day (QID) | INTRAMUSCULAR | Status: DC | PRN
  Filled 2021-10-01: qty 2

## 2021-10-01 MED ORDER — CHLORHEXIDINE GLUCONATE CLOTH 2 % EX PADS
6.0000 | MEDICATED_PAD | Freq: Every day | CUTANEOUS | Status: DC
  Administered 2021-10-01 – 2021-10-08 (×6): 6 via TOPICAL

## 2021-10-01 MED ORDER — ONDANSETRON HCL 4 MG PO TABS: 4.0000 mg | ORAL_TABLET | Freq: Four times a day (QID) | ORAL | Status: DC | PRN

## 2021-10-01 MED ORDER — ACETAMINOPHEN 650 MG RE SUPP: 650.0000 mg | Freq: Four times a day (QID) | RECTAL | Status: DC | PRN

## 2021-10-01 MED ORDER — POLYVINYL ALCOHOL 1.4 % OP SOLN: 1.0000 [drp] | OPHTHALMIC | Status: DC | PRN

## 2021-10-01 MED ORDER — CITALOPRAM HYDROBROMIDE 20 MG PO TABS
40.0000 mg | ORAL_TABLET | Freq: Every day | ORAL | Status: DC
  Administered 2021-10-01 – 2021-10-09 (×8): 40 mg via ORAL
  Filled 2021-10-01 (×9): qty 2

## 2021-10-01 MED ORDER — FOLIC ACID 1 MG PO TABS
1.0000 mg | ORAL_TABLET | Freq: Every day | ORAL | Status: DC
  Administered 2021-10-01 – 2021-10-09 (×8): 1 mg via ORAL
  Filled 2021-10-01 (×9): qty 1

## 2021-10-01 MED ORDER — LACTATED RINGERS IV BOLUS
250.0000 mL | Freq: Once | INTRAVENOUS | Status: AC
Start: 2021-10-01 — End: 2021-10-01
  Administered 2021-10-01: 250 mL via INTRAVENOUS

## 2021-10-01 MED ORDER — SODIUM CHLORIDE 0.9% FLUSH: 10.0000 mL | INTRAVENOUS | Status: DC | PRN

## 2021-10-01 MED ORDER — ALBUTEROL SULFATE (2.5 MG/3ML) 0.083% IN NEBU: 2.5000 mg | INHALATION_SOLUTION | RESPIRATORY_TRACT | Status: DC | PRN

## 2021-10-01 MED ORDER — FLUTICASONE-UMECLIDIN-VILANT 100-62.5-25 MCG/ACT IN AEPB: 1.0000 | INHALATION_SPRAY | Freq: Every day | RESPIRATORY_TRACT | Status: DC

## 2021-10-01 MED ORDER — ALPRAZOLAM 0.5 MG PO TABS
2.0000 mg | ORAL_TABLET | Freq: Three times a day (TID) | ORAL | Status: DC | PRN
  Administered 2021-10-01 – 2021-10-09 (×13): 2 mg via ORAL
  Filled 2021-10-01: qty 2
  Filled 2021-10-01: qty 4
  Filled 2021-10-01: qty 2
  Filled 2021-10-01 (×7): qty 4
  Filled 2021-10-01: qty 2
  Filled 2021-10-01 (×4): qty 4

## 2021-10-01 MED ORDER — NICOTINE 21 MG/24HR TD PT24
21.0000 mg | MEDICATED_PATCH | Freq: Every day | TRANSDERMAL | Status: DC
  Administered 2021-10-01 – 2021-10-09 (×8): 21 mg via TRANSDERMAL
  Filled 2021-10-01 (×9): qty 1

## 2021-10-01 MED ORDER — HYDROCODONE-ACETAMINOPHEN 10-325 MG PO TABS
1.0000 | ORAL_TABLET | ORAL | Status: DC | PRN
  Administered 2021-10-01 – 2021-10-09 (×13): 1 via ORAL
  Filled 2021-10-01 (×13): qty 1

## 2021-10-01 MED ORDER — HEPARIN SODIUM (PORCINE) 5000 UNIT/ML IJ SOLN
5000.0000 [IU] | Freq: Three times a day (TID) | INTRAMUSCULAR | Status: DC
  Administered 2021-10-01: 5000 [IU] via SUBCUTANEOUS
  Filled 2021-10-01: qty 1

## 2021-10-01 MED ORDER — SODIUM CHLORIDE 0.9% FLUSH
10.0000 mL | Freq: Two times a day (BID) | INTRAVENOUS | Status: DC
  Administered 2021-10-01: 30 mL
  Administered 2021-10-01 – 2021-10-03 (×5): 10 mL
  Administered 2021-10-04: 20 mL
  Administered 2021-10-05 – 2021-10-08 (×7): 10 mL

## 2021-10-01 MED ORDER — UMECLIDINIUM BROMIDE 62.5 MCG/ACT IN AEPB
1.0000 | INHALATION_SPRAY | Freq: Every day | RESPIRATORY_TRACT | Status: DC
  Administered 2021-10-01 – 2021-10-09 (×8): 1 via RESPIRATORY_TRACT
  Filled 2021-10-01 (×2): qty 7

## 2021-10-01 MED ORDER — ALBUTEROL SULFATE HFA 108 (90 BASE) MCG/ACT IN AERS: 1.0000 | INHALATION_SPRAY | RESPIRATORY_TRACT | Status: DC | PRN

## 2021-10-01 MED ORDER — PERFLUTREN LIPID MICROSPHERE
1.0000 mL | INTRAVENOUS | Status: AC | PRN
  Administered 2021-10-01: 3 mL via INTRAVENOUS
  Filled 2021-10-01: qty 10

## 2021-10-01 MED ORDER — MIDODRINE HCL 5 MG PO TABS
10.0000 mg | ORAL_TABLET | Freq: Three times a day (TID) | ORAL | Status: DC
  Administered 2021-10-01 – 2021-10-09 (×25): 10 mg via ORAL
  Filled 2021-10-01 (×26): qty 2

## 2021-10-01 MED ORDER — ACETAMINOPHEN 325 MG PO TABS
650.0000 mg | ORAL_TABLET | Freq: Four times a day (QID) | ORAL | Status: DC | PRN
  Administered 2021-10-02: 650 mg via ORAL
  Filled 2021-10-01: qty 2

## 2021-10-01 NOTE — Assessment & Plan Note (Addendum)
--   Asymptomatic, continue midodrine

## 2021-10-01 NOTE — Assessment & Plan Note (Addendum)
Continue statin. 

## 2021-10-01 NOTE — Assessment & Plan Note (Addendum)
resolved 

## 2021-10-01 NOTE — Assessment & Plan Note (Addendum)
--   Stable, no wheezing, continue bronchodilators as needed, nicotine patch

## 2021-10-01 NOTE — Assessment & Plan Note (Addendum)
--   TSH high at 15.4, looking back consistently high over multiple data points. --Clinical significance unclear, continue Synthroid at current dose of 165mcg daily - outpatient follow-up with PCP, repeat thyroid panel in 4 to 6 weeks.

## 2021-10-01 NOTE — Assessment & Plan Note (Addendum)
--   History of ischemic cardiomyopathy, status post ICD, CAD, PEA arrest in 03/2021 -cardiology following, continue aspirin, statin  -Continue midodrine for hypotension

## 2021-10-01 NOTE — Progress Notes (Signed)
°  Progress Note Patient: [name] HYI:[id number] DOB: 06/11/1961 DOA: 09/30/2021     0 DOS: the patient was seen and examined on 10/01/2021   Brief hospital course: 61 year old man complicated PMH including systolic CHF, AICD, non-small cell lung cancer on chemotherapy currently, chronic hypotension, presented with pleuritic chest pain, imaging revealed large pericardial effusion confirmatory of echocardiogram.  Initially admitted to [facility] long stepdown unit.  Seen by cardiology 1/5 with recommendation for transfer to [place] (not 2H) and [facility] for cardiothoracic consultation and consideration for pericardial window.  Assessment and Plan * Malignant pericardial effusion- (present on admission) -- Marked by echocardiogram, discussed with cardiology team, they requested transfer to [facility] to see pulmonary [facility].  They will arrange cardiothoracic surgery consultation for consideration of pericardial window. -- Currently asymptomatic, has chronic hypotension, exam benign  HFrEF (heart failure with reduced ejection fraction) (HCC) -- EF about 30% on echocardiogram, no signs of volume overload, respiratory status stable.  Management per cardiology.  Monitor volume status, I's/O, daily weights.  Non-small cell carcinoma of right lung, stage 3 ([place])- (present on admission) -- Currently undergoing chemotherapy per Dr. Earlie Server.  We will notify him of admission.  Presumed pericardial effusion is malignant in nature.  Prolonged QT interval --follow EKG, check in AM. Management per cardiology.  CAD (coronary artery disease) -- Quiescent.  Currently on aspirin and statin  Hypothyroidism- (present on admission) -- TSH high at 15.4, looking back consistently high over multiple data points. --Clinical significance unclear.  We will continue Synthroid at current dosing.  Chronic hypotension -- Asymptomatic.  Continue midodrine.  COPD with chronic bronchitis and emphysema ([place])- (present  on admission) -- Appears stable.  Bronchodilators.  PAD (peripheral artery disease) ([place])- (present on admission) -- Status post right BKA.  Currently appears stable.  Continue statin, aspirin.  Aortic atherosclerosis ([place]) --continue statin  S/P implantation of automatic cardioverter/defibrillator (AICD) -- Per cardiology.  Prolonged QT noted.  Hypomagnesemia --replete     Subjective:  Feels okay, has some chest pressure.  No shortness of breath.  Objective Vital signs were reviewed and unremarkable except for mild hypotension, asymptomatic. Physical Exam Vitals reviewed.  Constitutional:      General: He is not in acute distress.    Appearance: He is not ill-appearing or toxic-appearing.  Cardiovascular:     Rate and Rhythm: Normal rate and regular rhythm.     Heart sounds: No murmur heard.    Comments: Muffled heart sounds Pulmonary:     Effort: Pulmonary effort is normal. No respiratory distress.     Breath sounds: No wheezing, rhonchi or rales.  Abdominal:     General: Abdomen is flat. There is no distension.     Palpations: Abdomen is soft.  Musculoskeletal:     Left lower leg: No edema.  Neurological:     Mental Status: He is alert.  Psychiatric:        Mood and Affect: Mood normal.        Behavior: Behavior normal.   Data Reviewed:  Potassium has corrected to 3.6, magnesium still low at 1.6 consistent with hypomagnesemia, will replete, anemia stable at 11.0 hemoglobin.  Platelets are critically low at 147.  TSH 15.4.  Family Communication: none   Disposition: Status is: Observation    Hold DVT prophylaxis for now as patient may need pericardial window or pericardiocentesis  Time spent: 35 minutes  Author: Murray Hodgkins, MD 10/01/2021 11:10 AM  For on call review [email or web address].

## 2021-10-01 NOTE — Progress Notes (Signed)
°  Transition of Care [facility]) Screening Note   Patient Details  Name: BRANDUN PINN Date of Birth: January 25, 1961   Transition of Care [facility]) CM/SW Contact:    Lennart Pall, LCSW Phone Number: 10/01/2021, 11:13 AM    Transition of Care Department [facility]) has reviewed patient and no TOC needs have been identified at this time. We will continue to monitor patient advancement through interdisciplinary progression rounds. If new patient transition needs arise, please place a TOC consult.

## 2021-10-01 NOTE — Progress Notes (Signed)
Echocardiogram 2D Echocardiogram has been performed.  Oneal Deputy Calisha Tindel RDCS 10/01/2021, 8:56 AM  Dr. Harrington Challenger notified

## 2021-10-01 NOTE — Assessment & Plan Note (Addendum)
--   First diagnosed in 2019, follows Dr. Julien Nordmann, undergoing chemotherapy, last chemo was 12/6  -Presumed pericardial effusion to be malignant, follow cytology results, still pending. -Outpatient follow-up scheduled with Dr. Julien Nordmann on 10/12/2021

## 2021-10-01 NOTE — Assessment & Plan Note (Signed)
-  replete °

## 2021-10-01 NOTE — Assessment & Plan Note (Addendum)
--   Cardiology following, history of ischemic cardiomyopathy/CAD  DES to LAD.   -2D echo showed EF about 30%, normalrightventricularsystolicfunction, large pericardial effusion -Difficult to optimize medical management, with hypotension and multiple medical comorbidities, recommended to hold off on lisinopril. -Limited echo showed EF of 30 to 35%,, normal RV SF, trivial pericardial effusion

## 2021-10-01 NOTE — Assessment & Plan Note (Addendum)
--   History of right BKA, continue aspirin, statin.  No acute issues

## 2021-10-01 NOTE — Assessment & Plan Note (Addendum)
--   2D echo showed large pericardial effusion, status post left video-assisted thoracoscopy, pericardial window and evacuation of pericardial effusion by Dr. Kipp Brood on 10/04/2021  -Chest tube in place.  Fluid from Pleurovac sent for cytology again on 1/11, results pending -Drain removed on 1/11, chest tube to be removed today per CT VS and cleared to be discharged home after chest tube removal -Chest x-ray showed no pneumothorax

## 2021-10-01 NOTE — Hospital Course (Addendum)
61 year old man complicated PMH including systolic CHF, AICD, non-small cell lung cancer on chemotherapy currently, chronic hypotension, presented with pleuritic chest pain, imaging revealed large pericardial effusion on 2D echo Underwent left video-assisted thoracoscopy, pericardial window and evacuation of pericardial effusion by Dr. Kipp Brood on 1/8 -CTS following.  Drain removed on 1/11.  Chest tube to be removed today - will order 2 view chest x-ray for a.m. -Hopefully DC home in a.m.

## 2021-10-01 NOTE — Assessment & Plan Note (Addendum)
--   Per cardiology.  Currently no acute issues, stable.

## 2021-10-01 NOTE — Consult Note (Addendum)
Cardiology Consultation:   Patient ID: OCEAN SCHILDT MRN: [id number]; DOB: Jan 21, 1961  Admit date: 09/30/2021 Date of Consult: 10/01/2021  PCP:  Redmond School, MD   [facility] Providers Cardiologist:  [name], MD  Electrophysiologist:  [name], MD  {  Patient Profile:   [name] is a 61 y.o. male with a hx of CAD, ICM, ICD, PAD with right BKA, ongoing tobacco use, hepatitis C, seizure disorder, and lung cancer who is being seen 10/01/2021 for the evaluation of pericardial effusion at the request of Dr. Sarajane Jews.  History of Present Illness:   [name] has a history of PAD and underwent femoral-tibial bypass in 2009 followed by BKA. He suffered an anterior STEMI 2017 with 100% occlusion of the LAD treated with DES. Echo showed LVEF 20-25% with focal wall motion abnormalities. GDMT was titrated but repeat echo in 2018 showed continued EF of 25-30% with akinesis of the apex, apical septum, mid-apical inferior and mid-apical anterior myocardium. He underwent ICD placement 02/2018 with Dr. Curt Bears. Post-op CXR showed left upper lobe mass which was subsequently diagnosed as stage IIIa squamous cell lung cancer. He is actively undergoing chemotherapy with carboplatin and alimta. He was last seen 07/14/20 and was doing well from a cardiovascular standpoint.   He was hospitalized 03/2021 after being found down in the bathroom at chemo. Hypotensive on arrival treated with norepinephrine. He is hypotensive at baseline, but recently had poor PO intake in the setting of CHF medications. It was felt his episode was consistent with PEA arrest due to hypotension. Following stabilization and extubation, he had a seizure and was transferred to [facility] for neuro workup. He had an additional seizure at [facility]. He had been noncompliant with keppra and was subsequently placed on vimpat. Echocardiogram during that hospitalization showed continued LVEF of 25-30% with WMA, grade 2 DD, small pericardial  effusion anterior to right ventricle without tamponade.  No significant valvular disease.   CT chest 07/23/21 by oncology showed small pericardial effusion, unchanged from prior imaging.  He presented to [facility] 09/30/21 with pleuritic chest pain and underwent CTA chest which was negative for PE but showed interval increase in a pericardial effusion, read as large, compared to CT 06/2020. Echo pending.   He is found sleeping sitting up in bed. He describes chest pain with coughing and worse with deep inspiration for the last 2 days. He denies recent illness. He does not think this chest pain is similar to his angina prior to his STEMI. He is short of breath at baseline and reports no change in his breathing. He was scheduled for chemotherapy today.   Port-a-cath in place with [facility]. Pt is hypotensive in the 80-90s but not tachycardic.    Past Medical History:  Diagnosis Date   Acute hepatitis C virus infection 06/19/2007   Qualifier: Diagnosis of  By: Leilani Merl CMA, Tiffany     Acute ST elevation myocardial infarction (STEMI) involving left anterior descending (LAD) coronary artery (HCC) 05/26/2016   Acute ST elevation myocardial infarction (STEMI) of lateral wall (HCC) 05/26/2016   AICD (automatic cardioverter/defibrillator) present 03/02/2018   Anxiety    Asthma    Atherosclerosis of native arteries of the extremities with intermittent claudication 12/16/2011   Chronic hepatitis C without hepatic coma ([place]) 05/03/2014   COPD with chronic bronchitis and emphysema ([place]) 03/13/2018   FEV1 57%   Depression    DVT (deep venous thrombosis) ([place]) 2009; 2019   RLE; LLE   Encounter for antineoplastic chemotherapy  03/22/2018   Hepatitis C    "tx'd in 2015"   High cholesterol    Ischemic cardiomyopathy 03/02/2018   Non-small cell carcinoma of right lung, stage 3 ([place]) 03/22/2018   PAD (peripheral artery disease) ([place]) 01/25/2013   Peripheral vascular disease, unspecified 07/20/2012   Port-A-Cath in place 04/24/2018    ST elevation myocardial infarction involving left anterior descending (LAD) coronary artery (HCC)    STEMI (ST elevation myocardial infarction) ([place]) 05/26/2016   Tobacco abuse 01/28/2016   Tubular adenoma of colon 07/11/2014   Urinary dribbling     Past Surgical History:  Procedure Laterality Date   AORTOGRAM  08/08/2009   for LLE claudication     By Dr. 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   BIOPSY N/A 05/30/2014   Procedure: BIOPSY;  Surgeon: [name], MD;  Location: [facility];  Service: Endoscopy;  Laterality: N/A;   BLADDER TUMOR EXCISION  8/812   CARDIAC CATHETERIZATION N/A 05/26/2016   Procedure: Left Heart Cath and Coronary Angiography;  Surgeon: [name], MD;  Location: [place] [facility];  Service: Cardiovascular;  Laterality: N/A;   CARDIAC CATHETERIZATION N/A 05/26/2016   Procedure: Coronary Stent Intervention;  Surgeon: [name], MD;  Location: [place] [facility];  Service: Cardiovascular;  Laterality: N/A;   COLONOSCOPY WITH PROPOFOL N/A 05/30/2014   MWN:UUVOZD colonic polyp-likely source of hematochezia-removed as described above   ESOPHAGOGASTRODUODENOSCOPY (EGD) WITH PROPOFOL N/A 05/30/2014   GUY:QIHKVQ and bulbar erosions s/p gastric biopsy. No evidence of portal gastropathy on today's examination.   FEMORAL-TIBIAL BYPASS GRAFT  2009   Right side using non-reversed GSV   By Dr. Anson [place] BYPASS GRAFT  11/24/2007   Right femoral to anterior tibial BPG   by Dr. Oneida Alar   FINGER SURGERY Left    "straightened my pinky"   HAND TENDON SURGERY Left 2013   Left 5th finger   HERNIA REPAIR     "stomach"   ICD IMPLANT N/A 03/02/2018   Procedure: ICD IMPLANT;  Surgeon: [name], MD;  Location: [place] [facility];  Service: Cardiovascular;  Laterality: N/A;   INCISIONAL HERNIA REPAIR N/A 12/25/2014   Procedure: HERNIA REPAIR INCISIONAL WITH MESH;  Surgeon: [name], MD;  Location: [facility];  Service: General;  Laterality: N/A;    INSERTION OF MESH N/A 12/25/2014   Procedure: INSERTION OF MESH;  Surgeon: [name], MD;  Location: [facility];  Service: General;  Laterality: N/A;   IR IMAGING GUIDED PORT INSERTION  04/11/2018   LAPAROSCOPIC CHOLECYSTECTOMY     LOWER EXTREMITY ANGIOGRAM Left 12/30/2015   Procedure: Lower Extremity Angiogram;  Surgeon: [name], MD;  Location: [place] [facility];  Service: Cardiovascular;  Laterality: Left;   PERIPHERAL VASCULAR CATHETERIZATION N/A 12/30/2015   Procedure: Abdominal Aortogram;  Surgeon: [name], MD;  Location: [place] [facility];  Service: Cardiovascular;  Laterality: N/A;   POLYPECTOMY  05/30/2014   Procedure: POLYPECTOMY;  Surgeon: [name], MD;  Location: [facility];  Service: Endoscopy;;   TONSILLECTOMY AND ADENOIDECTOMY  ~ 1970   TYMPANOSTOMY TUBE PLACEMENT Bilateral ~ 1970     Home Medications:  Prior to Admission medications   Medication Sig Start Date End Date Taking? Authorizing Provider  albuterol (VENTOLIN HFA) 108 (90 Base) MCG/ACT inhaler Inhale 1-2 puffs into the lungs every 4 (four) hours as needed for wheezing or shortness of breath. 06/30/20  Yes [provider]  alprazolam (XANAX) 2 MG tablet Take 2 mg by mouth 4 (four) times daily as needed for anxiety.   Yes [provider]  amitriptyline (ELAVIL) 100 MG tablet Take 100 mg by mouth at bedtime.    Yes [provider]  aspirin 81 MG EC tablet Take 81 mg by mouth daily.   Yes [provider]  atorvastatin (LIPITOR) 80 MG tablet Take 80 mg by mouth daily. 06/06/21  Yes [provider]  citalopram (CELEXA) 40 MG tablet Take 40 mg by mouth daily. 12/02/14  Yes [provider]  dexamethasone (DECADRON) 4 MG tablet Please take 1 tablet twice a day the day before, the day of, and the day after treatment Patient taking differently: Take 4 mg by mouth See admin instructions. Please take 1 tablet twice a day the day before, the day of, and the day  after treatment 06/16/21  Yes Heilingoetter, Cassandra L, PA-C  folic acid (FOLVITE) 1 MG tablet Take 1 tablet (1 mg total) by mouth daily. 02/24/21  Yes Heilingoetter, Cassandra L, PA-C  furosemide (LASIX) 20 MG tablet Take 1 tablet (20 mg total) by mouth daily for 4 days only. Patient taking differently: Take 20 mg by mouth daily. 09/22/21  Yes Camnitz, Ocie Doyne, MD  HYDROcodone-acetaminophen (NORCO) 10-325 MG tablet Take 1 tablet by mouth every 4 (four) hours as needed for moderate pain.  06/22/16  Yes [provider]  levothyroxine (SYNTHROID) 125 MCG tablet Take 125 mcg by mouth daily. 12/04/20  Yes [provider]  lidocaine-prilocaine (EMLA) cream Apply 1 application topically as needed. Patient taking differently: Apply 1 application topically daily as needed (port access). 05/05/21  Yes Curt Bears, MD  lisinopril (ZESTRIL) 2.5 MG tablet Take 2.5 mg by mouth daily. 08/03/21  Yes [provider]  midodrine (PROAMATINE) 10 MG tablet Take 1 tablet (10 mg total) by mouth 3 (three) times daily with meals. 03/29/21  Yes Ghimire, Henreitta Leber, MD  nitroGLYCERIN (NITROSTAT) 0.4 MG SL tablet Place 1 tablet (0.4 mg total) under the tongue every 5 (five) minutes x 3 doses as needed for chest pain. 05/28/16  Yes Jettie Booze E, NP  potassium chloride SA (KLOR-CON M) 20 MEQ tablet Take 20 mEq by mouth 2 (two) times daily.   Yes [provider]  prochlorperazine (COMPAZINE) 10 MG tablet Take 1 tablet (10 mg total) by mouth every 6 (six) hours as needed for nausea or vomiting. 02/24/21  Yes Heilingoetter, Cassandra L, PA-C  sucralfate (CARAFATE) 1 g tablet Take 1 tablet (1 g total) by mouth 4 (four) times daily -  with meals and at bedtime. 5 min before meals for radiation induced esophagitis Patient taking differently: Take 1 g by mouth daily. 04/14/18  Yes Tyler Pita, MD  tamsulosin (FLOMAX) 0.4 MG CAPS capsule Take 0.4 mg by mouth daily. 01/29/18  Yes [provider]  TRELEGY ELLIPTA 100-62.5-25 MCG/INH AEPB Inhale 1 puff into the lungs daily. 12/13/20  Yes [provider]    Inpatient Medications: Scheduled Meds:  amitriptyline  100 mg Oral QHS   aspirin EC  81 mg Oral Daily   atorvastatin  80 mg Oral Daily   Chlorhexidine Gluconate Cloth  6 each Topical Daily   citalopram  40 mg Oral Daily   fluticasone furoate-vilanterol  1 puff Inhalation Daily   And   umeclidinium bromide  1 puff Inhalation Daily   folic acid  1 mg Oral Daily   heparin  5,000 Units Subcutaneous Q8H  levothyroxine  125 mcg Oral Q0600   midodrine  10 mg Oral TID WC   potassium chloride SA  20 mEq Oral BID   sodium chloride flush  10-40 mL Intracatheter Q12H   sodium chloride flush  3 mL Intravenous Q12H   sucralfate  1 g Oral TID AC & HS   Continuous Infusions:  PRN Meds: acetaminophen **OR** acetaminophen, albuterol, ALPRAZolam, HYDROcodone-acetaminophen, ondansetron **OR** ondansetron (ZOFRAN) IV, senna-docusate, sodium chloride flush  Allergies:    Allergies  Allergen Reactions   Lyrica [Pregabalin] Palpitations    Chest pain and heart palps.   Neurontin [Gabapentin] Palpitations    Chest pain and heart palp    Social History:   Social History   Socioeconomic History   Marital status: Married    Spouse name: Not on file   Number of children: Not on file   Years of education: Not on file   Highest education level: Not on file  Occupational History   Not on file  Tobacco Use   Smoking status: Every Day    Years: 31.00    Types: Cigarettes   Smokeless tobacco: Never  Vaping Use   Vaping Use: Never used  Substance and Sexual Activity   Alcohol use: Not Currently    Alcohol/week: 18.0 standard drinks    Types: 18 Cans of beer per week    Comment:  history of heavy ETOH use; 03/02/2018 "6-12 beers Fri & Sat"   Drug use: Yes    Types: Marijuana    Comment: 03/02/2018 "weekly"   Sexual activity: Not Currently  Other Topics Concern   Not on  file  Social History Narrative   Not on file   Social Determinants of Health   Financial Resource Strain: Not on file  Food Insecurity: Not on file  Transportation Needs: Not on file  Physical Activity: Not on file  Stress: Not on file  Social Connections: Not on file  Intimate Partner Violence: Not on file    Family History:    Family History  Problem Relation Age of Onset   Vision loss Mother    Ulcers Father    Colon cancer Neg Hx      ROS:  Please see the history of present illness.   All other ROS reviewed and negative.     Physical Exam/Data:   Vitals:   10/01/21 0600 10/01/21 0615 10/01/21 0630 10/01/21 0800  BP: (!) 83/62  (!) 79/54   Pulse: 85 83 83   Resp: (!) 24 (!) 25 (!) 24   Temp:    97.6 F (36.4 C)  TempSrc:    Oral  SpO2: 92% 92% 93%   Weight:      Height:        Intake/Output Summary (Last 24 hours) at 10/01/2021 0823 Last data filed at 10/01/2021 0610 Gross per 24 hour  Intake 1740.93 ml  Output 500 ml  Net 1240.93 ml   Last 3 Weights 10/01/2021 09/30/2021 09/30/2021  Weight (lbs) 171 lb 1.2 oz 183 lb 183 lb  Weight (kg) 77.6 kg 83.008 kg 83.008 kg     Body mass index is 23.2 kg/m.  General:  male in NAD, sleeping HEENT: normal Neck: no JVD Vascular: No carotid bruits; Distal pulses 2+ bilaterally Cardiac:  normal S1, S2; RRR; no murmur  Lungs:  clear to auscultation bilaterally, no wheezing, rhonchi or rales  Abd: soft, nontender, no hepatomegaly  Ext: no edema on left Musculoskeletal:  right BKA Skin: warm and dry  Neuro:  CNs 2-12 intact, no focal abnormalities noted Psych:  Normal affect   EKG:  The EKG was personally reviewed and demonstrates:  sinus rhythm HR 86, old inferior and anterior infarct (seen previously), IVCD Telemetry:  Telemetry was personally reviewed and demonstrates:  sinus rhythm 70s-80s  Relevant CV Studies:  Echo pending  Echo   Laboratory Data:  High Sensitivity Troponin:   Recent Labs  Lab  09/30/21 2225  TROPONINIHS 7     Chemistry Recent Labs  Lab 09/30/21 1915 09/30/21 2225 10/01/21 0300  NA 131*  --  135  K 2.9*  --  3.6  CL 97*  --  101  CO2 29  --  27  GLUCOSE 98  --  111*  BUN 13  --  9  CREATININE 0.84  --  0.74  CALCIUM 8.3*  --  8.1*  MG  --  1.5* 1.6*  GFRNONAA >60  --  >60  ANIONGAP 5  --  7    Recent Labs  Lab 09/30/21 1915  PROT 6.6  ALBUMIN 3.4*  AST 19  ALT 20  ALKPHOS 87  BILITOT 0.7   Lipids No results for input(s): CHOL, TRIG, HDL, LABVLDL, LDLCALC, CHOLHDL in the last 168 hours.  Hematology Recent Labs  Lab 09/30/21 1915 10/01/21 0300  WBC 11.2* 7.7  RBC 3.56* 3.37*  HGB 11.5* 11.0*  HCT 35.8* 34.4*  MCV 100.6* 102.1*  MCH 32.3 32.6  MCHC 32.1 32.0  RDW 15.7* 15.7*  PLT 179 147*   Thyroid  Recent Labs  Lab 10/01/21 0300  TSH 15.414*    BNP Recent Labs  Lab 09/30/21 1915  BNP 108.2*    DDimer No results for input(s): DDIMER in the last 168 hours.   Radiology/Studies:  CT Angio Chest PE W and/or Wo Contrast  Result Date: 09/30/2021 CLINICAL DATA:  Intermittent sharp chest pain for the last 4 days, lung cancer EXAM: CT ANGIOGRAPHY CHEST WITH CONTRAST TECHNIQUE: Multidetector CT imaging of the chest was performed using the standard protocol during bolus administration of intravenous contrast. Multiplanar CT image reconstructions and MIPs were obtained to evaluate the vascular anatomy. CONTRAST:  63mL OMNIPAQUE IOHEXOL 350 MG/ML SOLN COMPARISON:  07/23/2021 FINDINGS: Cardiovascular: This is a technically adequate evaluation of the pulmonary vasculature. No filling defects or pulmonary emboli. Large pericardial is effusion is identified, measuring up to 2.3 cm in thickness. The heart is not enlarged. Dual lead pacer is identified. No evidence of thoracic aortic aneurysm or dissection. Atherosclerosis of the aorta and coronary vasculature. Mediastinum/Nodes: No enlarged mediastinal, hilar, or axillary lymph nodes. Thyroid  gland, trachea, and esophagus demonstrate no significant findings. Lungs/Pleura: Stable post therapeutic changes within the right upper lobe, with continued cavitary lesion measuring 4.9 x 3.0 cm. Dense fibrosis and consolidation within the right upper lobe unchanged. Stable 0.8 cm left upper lobe ground-glass pulmonary nodule. Continued bilateral bronchial wall thickening, right greater than left. Trace bilateral pleural fluid. No pneumothorax. Upper Abdomen: No acute abnormality. Musculoskeletal: No acute or destructive bony lesions. Reconstructed images demonstrate no additional findings. Review of the MIP images confirms the above findings. IMPRESSION: 1. No evidence of pulmonary embolus. 2. Stable post therapeutic changes within the right upper lobe, with fibrosis, dense consolidation, and cavitation as above. If recurrent or residual disease is suspected, PET CT could be performed. 3. Stable nonspecific 0.8 cm ground-glass left upper lobe pulmonary nodule. 4. Large pericardial effusion, increased since prior study. 5. Trace bilateral pleural fluid. 6.  Aortic Atherosclerosis (  ICD10-I70.0). Electronically Signed   By: Randa Ngo M.D.   On: 09/30/2021 20:37     Assessment and Plan:   Pericardial effusion - appreciated large effusion on CTA - echo pending - he is hypotensive, but not tachycardic - is hypotensive at baseline, requires midodrine - hold BB - if large pericardial effusion demonstrated on echo +/- tamponade physiology, may need to consider a window rather than pericardiocentesis given his ongoing lung cancer   CAD s/p anterior STEMI with DES to LAD 2017 Hyperlipidemia with LDL goal < 70 PAD - continue ASA and statin - hs troponin x 2 negative, EKG stable - low suspicion for ACS   Ischemic cardiomyopathy  Chronic systolic and diastolic heart failure Hypotension - BP baseline is marginal and requires midodrine - GDMT PTA included 2.5 mg lisinopril - on hold - does not  appear to be in decompensated heart failure - BNP 108 - cautious with fluids - continue midodrine - would keep MAP > 60    Hypothyroidism - TSH > 15, up from 9 - has been on synthroid   Hypomagnesemia  - Mg 1.6 --> replace per primary   NSCLC 3B - follows with Dr. Earlie Server   Will make NPO for now while we obtain echo.   Risk Assessment/Risk Scores:     HEAR Score (for undifferentiated chest pain):  HEAR Score: 4  New York Heart Association (NYHA) Functional Class NYHA Class III  For questions or updates, please contact [facility] Please consult [email or web address] for contact info under   Signed, Ledora Bottcher, PA  10/01/2021 8:23 AM As above, patient seen and examined.  Briefly he is a 61 year old male with past medical history of coronary artery disease, ischemic cardiomyopathy, prior ICD, peripheral vascular disease status post right BKA, tobacco abuse, hepatitis C, seizure disorder and lung cancer for evaluation of pericardial effusion.  Patient states he has had increasing dyspnea on exertion but cannot be more specific in terms of time.  He also developed chest pain described as an aching sensation.  It increases with inspiration and lying flat.  He presented to the emergency room and was admitted for further evaluation.  Cardiology now asked to evaluate for large pericardial effusion. CTA showed no pulmonary embolus, right upper lobe consolidation/fibrosis/cavitation, large pericardial effusion increased in size compared to previous.  Echocardiogram shows ejection fraction 30%, large pericardial effusion increased in size compared to June 2022, mitral inflow variation, IVC dilated with blunted respiratory collapse suggestive of tamponade physiology. BUN 9 and creatinine 0.74, hemoglobin 11, platelet count 147. Electrocardiogram shows sinus rhythm, anterior infarct, subtle diffuse ST elevation.  1 large pericardial effusion-I have personally reviewed the patient's  echocardiogram and agree the effusion is large with mitral inflow variation and dilated IVC without collapse.  The effusion is larger compared to June 2022.  This is likely malignant in etiology.  I discussed the effusion with patient and aggressiveness of care and he would like all measures at this point.  We will transfer to [facility] and I will ask cardiothoracic surgery to review for pericardial window.  If he becomes more unstable may need pericardiocentesis on a more emergent basis.  2 coronary artery disease-we will continue aspirin and statin.  3 ischemic cardiomyopathy-patient is on minimal dose lisinopril at home as he has difficulty with with chronic hypotension (is on midodrine at home).  We will hold lisinopril.  Can resume later if blood pressure allows.  4 status post ICD  5 lung cancer-ongoing  therapy with oncology.  Will need follow-up once pericardial fusion addressed.  6 tobacco abuse-patient counseled on discontinuing.  Kirk Ruths, MD

## 2021-10-02 DIAGNOSIS — I3131 Malignant pericardial effusion in diseases classified elsewhere: Secondary | ICD-10-CM | POA: Diagnosis not present

## 2021-10-02 LAB — BLOOD CULTURE ID PANEL (REFLEXED) - BCID2

## 2021-10-02 MED ORDER — SODIUM CHLORIDE 0.9 % IV SOLN: INTRAVENOUS | Status: DC

## 2021-10-02 NOTE — Progress Notes (Signed)
Progress Note  Patient Name: [name] Date of Encounter: 10/02/2021  [facility] Cardiologist: [name], MD   Subjective   Mild dyspnea; mild CP  Inpatient Medications    Scheduled Meds:  amitriptyline  100 mg Oral QHS   aspirin EC  81 mg Oral Daily   atorvastatin  80 mg Oral Daily   Chlorhexidine Gluconate Cloth  6 each Topical Daily   citalopram  40 mg Oral Daily   fluticasone furoate-vilanterol  1 puff Inhalation Daily   And   umeclidinium bromide  1 puff Inhalation Daily   folic acid  1 mg Oral Daily   levothyroxine  125 mcg Oral Q0600   midodrine  10 mg Oral TID WC   nicotine  21 mg Transdermal Daily   potassium chloride SA  20 mEq Oral BID   sodium chloride flush  10-40 mL Intracatheter Q12H   sodium chloride flush  3 mL Intravenous Q12H   sucralfate  1 g Oral TID AC & HS   Continuous Infusions:  PRN Meds: acetaminophen **OR** acetaminophen, albuterol, ALPRAZolam, HYDROcodone-acetaminophen, ondansetron **OR** ondansetron (ZOFRAN) IV, polyvinyl alcohol, senna-docusate, sodium chloride flush   Vital Signs    Vitals:   10/02/21 0630 10/02/21 0651 10/02/21 0700 10/02/21 0730  BP: (!) 79/45 92/64 92/60  102/64  Pulse: 86 85 86 84  Resp: (!) 21 13 (!) 25 15  Temp:      TempSrc:      SpO2: 92% 94% 93% 93%  Weight:      Height:        Intake/Output Summary (Last 24 hours) at 10/02/2021 0839 Last data filed at 10/02/2021 0400 Gross per 24 hour  Intake 239.11 ml  Output 1900 ml  Net -1660.89 ml   Last 3 Weights 10/02/2021 10/01/2021 09/30/2021  Weight (lbs) 173 lb 4.5 oz 171 lb 1.2 oz 183 lb  Weight (kg) 78.6 kg 77.6 kg 83.008 kg      Telemetry    NSR - Personally Reviewed   Physical Exam   GEN: No acute distress.  Chronically ill appearing Neck: No JVD Cardiac: RRR Respiratory: Clear to auscultation bilaterally.  GI: Soft, nontender, non-distended  MS: No edema; s/p right BKA Neuro:  Nonfocal  Psych: Normal affect   Labs    High  Sensitivity Troponin:   Recent Labs  Lab 09/30/21 2225  TROPONINIHS 7     Chemistry Recent Labs  Lab 09/30/21 1915 09/30/21 2225 10/01/21 0300  NA 131*  --  135  K 2.9*  --  3.6  CL 97*  --  101  CO2 29  --  27  GLUCOSE 98  --  111*  BUN 13  --  9  CREATININE 0.84  --  0.74  CALCIUM 8.3*  --  8.1*  MG  --  1.5* 1.6*  PROT 6.6  --   --   ALBUMIN 3.4*  --   --   AST 19  --   --   ALT 20  --   --   ALKPHOS 87  --   --   BILITOT 0.7  --   --   GFRNONAA >60  --  >60  ANIONGAP 5  --  7     Hematology Recent Labs  Lab 09/30/21 1915 10/01/21 0300  WBC 11.2* 7.7  RBC 3.56* 3.37*  HGB 11.5* 11.0*  HCT 35.8* 34.4*  MCV 100.6* 102.1*  MCH 32.3 32.6  MCHC 32.1 32.0  RDW 15.7* 15.7*  PLT 179 147*   Thyroid  Recent Labs  Lab 10/01/21 0300  TSH 15.414*    BNP Recent Labs  Lab 09/30/21 1915  BNP 108.2*      Radiology    CT Angio Chest PE W and/or Wo Contrast  Result Date: 09/30/2021 CLINICAL DATA:  Intermittent sharp chest pain for the last 4 days, lung cancer EXAM: CT ANGIOGRAPHY CHEST WITH CONTRAST TECHNIQUE: Multidetector CT imaging of the chest was performed using the standard protocol during bolus administration of intravenous contrast. Multiplanar CT image reconstructions and MIPs were obtained to evaluate the vascular anatomy. CONTRAST:  22mL OMNIPAQUE IOHEXOL 350 MG/ML SOLN COMPARISON:  07/23/2021 FINDINGS: Cardiovascular: This is a technically adequate evaluation of the pulmonary vasculature. No filling defects or pulmonary emboli. Large pericardial is effusion is identified, measuring up to 2.3 cm in thickness. The heart is not enlarged. Dual lead pacer is identified. No evidence of thoracic aortic aneurysm or dissection. Atherosclerosis of the aorta and coronary vasculature. Mediastinum/Nodes: No enlarged mediastinal, hilar, or axillary lymph nodes. Thyroid gland, trachea, and esophagus demonstrate no significant findings. Lungs/Pleura: Stable post therapeutic  changes within the right upper lobe, with continued cavitary lesion measuring 4.9 x 3.0 cm. Dense fibrosis and consolidation within the right upper lobe unchanged. Stable 0.8 cm left upper lobe ground-glass pulmonary nodule. Continued bilateral bronchial wall thickening, right greater than left. Trace bilateral pleural fluid. No pneumothorax. Upper Abdomen: No acute abnormality. Musculoskeletal: No acute or destructive bony lesions. Reconstructed images demonstrate no additional findings. Review of the MIP images confirms the above findings. IMPRESSION: 1. No evidence of pulmonary embolus. 2. Stable post therapeutic changes within the right upper lobe, with fibrosis, dense consolidation, and cavitation as above. If recurrent or residual disease is suspected, PET CT could be performed. 3. Stable nonspecific 0.8 cm ground-glass left upper lobe pulmonary nodule. 4. Large pericardial effusion, increased since prior study. 5. Trace bilateral pleural fluid. 6.  Aortic Atherosclerosis (ICD10-I70.0). Electronically Signed   By: Randa Ngo M.D.   On: 09/30/2021 20:37   ECHOCARDIOGRAM COMPLETE  Result Date: 10/01/2021    ECHOCARDIOGRAM REPORT   Patient Name:   [name] Date of Exam: 10/01/2021 Medical Rec #:  [id number]   Height:       72.0 in Accession #:    [id number]  Weight:       171.1 lb Date of Birth:  Dec 26, 1960   BSA:          1.994 m Patient Age:    61 years    BP:           78/57 mmHg Patient Gender: M           HR:           82 bpm. Exam Location:  Inpatient Procedure: 2D Echo, Color Doppler, Cardiac Doppler and Intracardiac            Opacification Agent Indications:    I31.3 Pericardial effusion  History:        Patient has prior history of Echocardiogram examinations, most                 recent 03/24/2021. Defibrillator, COPD; Risk                 Factors:Dyslipidemia.  Sonographer:    Raquel Sarna Senior RDCS Referring Phys: [id number] VISHAL R PATEL  Sonographer Comments: Very poor echo windows due to COPD and  Lung Cancer IMPRESSIONS  1. Poor acoustic windows Definity used. LVEF is  depressed. There is akinesis of the mid/distal septum, distal anterior and apical walls. Overall LVEF is probably 30%.  2. Right ventricular systolic function is normal. The right ventricular size is normal.  3. Large pericardial effusion surrounds heart with consolidatation. Measures 17 mm in maximal dimension. More prominent than in echo done in June 2022     Effusion is most prominent at apex and posteriorly     There is mitral inflow variation. The IVC is dilated with blunted respiratory collapse     There is no definite RV collapse, RA collapse, however poor acoustic windows limit apical views.     Concerning for hemodynamic compromise. Case reviewed with W Camnitz.  4. The mitral valve is grossly normal. No evidence of mitral valve regurgitation.  5. The aortic valve is normal in structure. Aortic valve regurgitation is not visualized. FINDINGS  Left Ventricle: Poor acoustic windows Definity used. LVEF is depressed. There is akinesis of the mid/distal septum, distal anterior and apical walls. Overall LVEF is probably 30%. Definity contrast agent was given IV to delineate the left ventricular endocardial borders. The left ventricular internal cavity size was normal in size. There is no left ventricular hypertrophy. Right Ventricle: The right ventricular size is normal. Right vetricular wall thickness was not assessed. Right ventricular systolic function is normal. Left Atrium: Left atrial size was normal in size. Right Atrium: Right atrial size was normal in size. Pericardium: Large pericardial effusion surrounds heart with consolidatation. Measures 17 mm in maximal dimension. More prominent than in echo done in June 2022 Effusion is most prominent at apex and posteriorly There is mitral inflow variation. The IVC is dilated with blunted respiratory collapse There is no definite RV collapse, RA collapse, however poor acoustic windows limit  apical views. Concerning for hemodynamic compromise. Case reviewed with W Camnitz. Mitral Valve: The mitral valve is grossly normal. No evidence of mitral valve regurgitation. Tricuspid Valve: The tricuspid valve is grossly normal. Tricuspid valve regurgitation is not demonstrated. Aortic Valve: The aortic valve is normal in structure. Aortic valve regurgitation is not visualized. Pulmonic Valve: The pulmonic valve was not well visualized. Aorta: The aortic root is normal in size and structure. IAS/Shunts: The interatrial septum was not assessed. Additional Comments: A device lead is visualized.  LEFT VENTRICLE PLAX 2D LVIDd:         4.50 cm LVIDs:         3.30 cm LV PW:         0.90 cm LV IVS:        0.80 cm LVOT diam:     1.90 cm LV SV:         38 LV SV Index:   19 LVOT Area:     2.84 cm  LV Volumes (MOD) LV vol d, MOD A2C: 112.0 ml LV vol d, MOD A4C: 118.0 ml LV vol s, MOD A2C: 74.3 ml LV vol s, MOD A4C: 48.4 ml LV SV MOD A2C:     37.7 ml LV SV MOD A4C:     118.0 ml LV SV MOD BP:      54.2 ml RIGHT VENTRICLE RV S prime:     8.81 cm/s TAPSE (M-mode): 1.1 cm LEFT ATRIUM             Index        RIGHT ATRIUM           Index LA diam:        3.20 cm 1.61 cm/m   RA  Area:     13.00 cm LA Vol (A2C):   42.5 ml 21.32 ml/m  RA Volume:   29.90 ml  15.00 ml/m LA Vol (A4C):   50.3 ml 25.23 ml/m LA Biplane Vol: 49.1 ml 24.63 ml/m  AORTIC VALVE LVOT Vmax:   74.60 cm/s LVOT Vmean:  55.700 cm/s LVOT VTI:    0.133 m  AORTA Ao Root diam: 2.50 cm  SHUNTS Systemic VTI:  0.13 m Systemic Diam: 1.90 cm Dorris Carnes MD Electronically signed by Dorris Carnes MD Signature Date/Time: 10/01/2021/9:41:28 AM    Final      Patient Profile     61 year old male with past medical history of coronary artery disease, ischemic cardiomyopathy, prior ICD, peripheral vascular disease status post right BKA, tobacco abuse, hepatitis C, seizure disorder and lung cancer for evaluation of pericardial effusion.  Patient states admitted with increasing  dyspnea and CP. Cardiology now asked to evaluate for large pericardial effusion. CTA showed no pulmonary embolus, right upper lobe consolidation/fibrosis/cavitation, large pericardial effusion increased in size compared to previous.  Echocardiogram shows ejection fraction 30%, large pericardial effusion increased in size compared to June 2022, mitral inflow variation, IVC dilated with blunted respiratory collapse suggestive of tamponade physiology.   Assessment & Plan    1 large pericardial effusion-patient's echocardiogram previously personally reviewed.  There is a large pericardial effusion with variation of mitral inflow and dilated IVC without collapse.  The effusion is larger compared to June 2022 and is likely malignant in etiology.  Best option is likely pericardial window.  Patient is to be transferred to [facility] to be assessed by cardiothoracic surgery today and likely proceed with pericardial window at that time.  Also note I previously discussed aggressiveness of care with patient and he would like all measures at this point.     2 coronary artery disease-continue aspirin and statin.   3 ischemic cardiomyopathy-patient is on minimal dose lisinopril at home as he has difficulty with with chronic hypotension (is on midodrine at home).  We will continue off of lisinopril.   4 status post ICD   5 lung cancer-ongoing therapy with oncology.  Will need follow-up once pericardial effusion addressed.   6 tobacco abuse-patient counseled on discontinuing.  For questions or updates, please contact [place] Please consult [email or web address] for contact info under        Signed, Kirk Ruths, MD  10/02/2021, 8:39 AM

## 2021-10-02 NOTE — Progress Notes (Signed)
Received message from CVTS coordinator Levonne Spiller that Dr. Prescott Gum reviewed this patients images again and feels procedure is not urgent so they recommended for him to wait for a bed and when he gets to [facility] someone will eval for timing of surgery. It will either be done by Dr. Kipp Brood this weekend or Dr. Prescott Gum on Monday, CVTS team to decide upon arrival. Previous diet resumed. Spoke with patient on phone regarding plan of care and he acknowledged understanding. IM attending/nurse updated as well.

## 2021-10-02 NOTE — Plan of Care (Signed)
°  Problem: Education: Goal: Knowledge of General Education information will improve Description: Including pain rating scale, medication(s)/side effects and non-pharmacologic comfort measures Outcome: Progressing   Problem: Health Behavior/Discharge Planning: Goal: Ability to manage health-related needs will improve Outcome: Progressing   Problem: Clinical Measurements: Goal: Ability to maintain clinical measurements within normal limits will improve Outcome: Progressing Goal: Respiratory complications will improve Outcome: Progressing   Problem: Activity: Goal: Risk for activity intolerance will decrease Outcome: Progressing   Problem: Nutrition: Goal: Adequate nutrition will be maintained Outcome: Progressing   Problem: Coping: Goal: Level of anxiety will decrease Outcome: Progressing   Problem: Pain Managment: Goal: General experience of comfort will improve Outcome: Progressing   Problem: Safety: Goal: Ability to remain free from injury will improve Outcome: Progressing   Problem: Skin Integrity: Goal: Risk for impaired skin integrity will decrease Outcome: Progressing   Cindy S. Brigitte Pulse BSN, RN, [place] 10/02/2021 12:28 AM

## 2021-10-02 NOTE — Progress Notes (Signed)
PHARMACY - PHYSICIAN COMMUNICATION CRITICAL VALUE ALERT - BLOOD CULTURE IDENTIFICATION (BCID)  [name] is an 61 y.o. male who presented to [facility] on 09/30/2021 with a chief complaint of pleuritic chest pain, found to have malignant pericardial effusion.  Assessment:  1/3 BCx bottles growing sensitive Staph Epi (likely contamination - does have port but no issues with port site or other indications of systemic infection)  Name of physician (or Provider) Contacted: Sarajane Jews  Current antibiotics: none  Changes to prescribed antibiotics recommended: no changes at this time Recommendations accepted by provider  Results for orders placed or performed during the hospital encounter of 09/30/21  Blood Culture ID Panel (Reflexed) (Collected: 09/30/2021 10:25 PM)  Result Value Ref Range   Enterococcus faecalis NOT DETECTED NOT DETECTED   Enterococcus Faecium NOT DETECTED NOT DETECTED   Listeria monocytogenes NOT DETECTED NOT DETECTED   Staphylococcus species DETECTED (A) NOT DETECTED   Staphylococcus aureus (BCID) NOT DETECTED NOT DETECTED   Staphylococcus epidermidis DETECTED (A) NOT DETECTED   Staphylococcus lugdunensis NOT DETECTED NOT DETECTED   Streptococcus species NOT DETECTED NOT DETECTED   Streptococcus agalactiae NOT DETECTED NOT DETECTED   Streptococcus pneumoniae NOT DETECTED NOT DETECTED   Streptococcus pyogenes NOT DETECTED NOT DETECTED   A.calcoaceticus-baumannii NOT DETECTED NOT DETECTED   Bacteroides fragilis NOT DETECTED NOT DETECTED   Enterobacterales NOT DETECTED NOT DETECTED   Enterobacter cloacae complex NOT DETECTED NOT DETECTED   Escherichia coli NOT DETECTED NOT DETECTED   Klebsiella aerogenes NOT DETECTED NOT DETECTED   Klebsiella oxytoca NOT DETECTED NOT DETECTED   Klebsiella pneumoniae NOT DETECTED NOT DETECTED   Proteus species NOT DETECTED NOT DETECTED   Salmonella species NOT DETECTED NOT DETECTED   Serratia marcescens NOT DETECTED NOT DETECTED    Haemophilus influenzae NOT DETECTED NOT DETECTED   Neisseria meningitidis NOT DETECTED NOT DETECTED   Pseudomonas aeruginosa NOT DETECTED NOT DETECTED   Stenotrophomonas maltophilia NOT DETECTED NOT DETECTED   Candida albicans NOT DETECTED NOT DETECTED   Candida auris NOT DETECTED NOT DETECTED   Candida glabrata NOT DETECTED NOT DETECTED   Candida krusei NOT DETECTED NOT DETECTED   Candida parapsilosis NOT DETECTED NOT DETECTED   Candida tropicalis NOT DETECTED NOT DETECTED   Cryptococcus neoformans/gattii NOT DETECTED NOT DETECTED   Methicillin resistance mecA/C NOT DETECTED NOT DETECTED    [name] A 10/02/2021  3:47 PM

## 2021-10-02 NOTE — Progress Notes (Signed)
°  Progress Note   Patient: [name] TGG:[id number] DOB: Jan 18, 1961 DOA: 09/30/2021     1 DOS: the patient was seen and examined on 10/02/2021   Brief hospital course: 61 year old man complicated PMH including systolic CHF, AICD, non-small cell lung cancer on chemotherapy currently, chronic hypotension, presented with pleuritic chest pain, imaging revealed large pericardial effusion confirmatory of echocardiogram.  Initially admitted to [facility] long stepdown unit.  Seen by cardiology 1/5 with recommendation for transfer to [place] (not 2H) and [facility] for cardiothoracic consultation and consideration for pericardial window.  Assessment and Plan * Malignant pericardial effusion- (present on admission) -- Large by echocardiogram, cardiology right commended transfer to [place]. They will arrange cardiothoracic surgery consultation for consideration of pericardial window. -- Currently asymptomatic, has chronic hypotension, exam benign  HFrEF (heart failure with reduced ejection fraction) (HCC) -- EF about 30% on echocardiogram, no signs of volume overload, respiratory status stable.  Management per cardiology.  Monitor volume status, I/O  Non-small cell carcinoma of right lung, stage 3 ([place])- (present on admission) -- Currently undergoing chemotherapy per Dr. Earlie Server.  Notified of admission.  Presumed pericardial effusion is malignant in nature.  CAD (coronary artery disease) -- Quiescent.  Currently on aspirin and statin  Hypothyroidism- (present on admission) -- TSH high at 15.4, looking back consistently high over multiple data points. --Clinical significance unclear.  We will continue Synthroid at current dosing.  Chronic hypotension -- Asymptomatic.  Continue midodrine.  COPD with chronic bronchitis and emphysema ([place])- (present on admission) -- Appears stable.  Bronchodilators.  PAD (peripheral artery disease) ([place])- (present on admission) -- Status post right BKA.  Currently  appears stable.  Continue statin, aspirin.  Prolonged QT interval-resolved as of 10/02/2021 --resolved  Aortic atherosclerosis ([place]) --continue statin  S/P implantation of automatic cardioverter/defibrillator (AICD) -- Per cardiology.  Prolonged QT noted.  Hypomagnesemia-resolved as of 10/02/2021 --replete     Subjective:  No issues overnight No pain, no SOB Still no bed at [facility]  Objective Vital signs were reviewed and unremarkable. Mild chronic asymptomatic HTN Physical Exam Vitals and nursing note reviewed.  Constitutional:      General: He is not in acute distress.    Appearance: He is not ill-appearing or toxic-appearing.  Cardiovascular:     Rate and Rhythm: Normal rate and regular rhythm.     Heart sounds: No murmur heard.    Comments: Telemetry SR Pulmonary:     Effort: Pulmonary effort is normal. No respiratory distress.     Breath sounds: No wheezing, rhonchi or rales.  Musculoskeletal:     Left lower leg: No edema.     Comments: Right BKA  Neurological:     Mental Status: He is alert.  Psychiatric:        Mood and Affect: Mood normal.        Behavior: Behavior normal.     Data Reviewed:  There are no new results to review at this time.  Family Communication: none  Disposition: Status is: Inpatient  Remains inpatient appropriate because: Large pericardial effusion with concern for hemodynamic compromise.         Time spent: 25 minutes  Author: Murray Hodgkins, MD 10/02/2021 10:03 AM  For on call review [email or web address].

## 2021-10-03 DIAGNOSIS — I5020 Unspecified systolic (congestive) heart failure: Secondary | ICD-10-CM

## 2021-10-03 DIAGNOSIS — I3131 Malignant pericardial effusion in diseases classified elsewhere: Secondary | ICD-10-CM | POA: Diagnosis not present

## 2021-10-03 DIAGNOSIS — I3139 Other pericardial effusion (noninflammatory): Secondary | ICD-10-CM

## 2021-10-03 DIAGNOSIS — I509 Heart failure, unspecified: Secondary | ICD-10-CM

## 2021-10-03 LAB — SURGICAL PCR SCREEN
MRSA, PCR: NEGATIVE
Staphylococcus aureus: NEGATIVE

## 2021-10-03 NOTE — Consult Note (Signed)
[name]       [place] [address]             [phone]                    [name]  Medical Record #[id number] Date of Birth: 17-Mar-1961  Referring: No ref. provider found Primary Care: Redmond School, MD Primary Cardiologist: [name], MD  Chief Complaint:    Chief Complaint  Patient presents with   Chest Pain    History of Present Illness:    [name] 61 y.o. male transferred from [facility] for surgical evaluation of a pericardial effusion.  He was originally admitted on January 4 complaints of chest pain.  He has a history of coronary artery disease and status post PCI with AICD placement.  He also has a history of ischemic cardiomyopathy.  There is a concern that this could be a malignant effusion given his history of recurrent non-small cell lung cancer.    Past Medical History:  Diagnosis Date   Acute hepatitis C virus infection 06/19/2007   Qualifier: Diagnosis of  By: Leilani Merl CMA, Tiffany     Acute ST elevation myocardial infarction (STEMI) involving left anterior descending (LAD) coronary artery (HCC) 05/26/2016   Acute ST elevation myocardial infarction (STEMI) of lateral wall (HCC) 05/26/2016   AICD (automatic cardioverter/defibrillator) present 03/02/2018   Anxiety    Asthma    Atherosclerosis of native arteries of the extremities with intermittent claudication 12/16/2011   Chronic hepatitis C without hepatic coma ([place]) 05/03/2014   COPD with chronic bronchitis and emphysema ([place]) 03/13/2018   FEV1 57%   Depression    DVT (deep venous thrombosis) ([place]) 2009; 2019   RLE; LLE   Encounter for antineoplastic chemotherapy 03/22/2018   Hepatitis C    "tx'd in 2015"   High cholesterol    Ischemic cardiomyopathy 03/02/2018   Non-small cell carcinoma of right lung, stage 3 ([place]) 03/22/2018   PAD (peripheral artery disease) ([place]) 01/25/2013   Peripheral vascular disease, unspecified 07/20/2012   Port-A-Cath in place  04/24/2018   ST elevation myocardial infarction involving left anterior descending (LAD) coronary artery (HCC)    STEMI (ST elevation myocardial infarction) ([place]) 05/26/2016   Tobacco abuse 01/28/2016   Tubular adenoma of colon 07/11/2014   Urinary dribbling     Past Surgical History:  Procedure Laterality Date   AORTOGRAM  08/08/2009   for LLE claudication     By Dr. 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   BIOPSY N/A 05/30/2014   Procedure: BIOPSY;  Surgeon: [name], MD;  Location: [facility];  Service: Endoscopy;  Laterality: N/A;   BLADDER TUMOR EXCISION  8/812   CARDIAC CATHETERIZATION N/A 05/26/2016   Procedure: Left Heart Cath and Coronary Angiography;  Surgeon: [name], MD;  Location: [place] [facility];  Service: Cardiovascular;  Laterality: N/A;   CARDIAC CATHETERIZATION N/A 05/26/2016   Procedure: Coronary Stent Intervention;  Surgeon: [name], MD;  Location: [place] [facility];  Service: Cardiovascular;  Laterality: N/A;   COLONOSCOPY WITH PROPOFOL N/A 05/30/2014   YOV:ZCHYIF colonic polyp-likely source of hematochezia-removed as described above   ESOPHAGOGASTRODUODENOSCOPY (EGD) WITH PROPOFOL N/A 05/30/2014   OYD:XAJOIN and bulbar erosions s/p gastric biopsy. No evidence of portal gastropathy on today's examination.   FEMORAL-TIBIAL BYPASS GRAFT  2009   Right side using non-reversed GSV   By Dr.  Fields   FEMORAL-TIBIAL BYPASS GRAFT  11/24/2007   Right femoral to anterior tibial BPG   by Dr. Oneida Alar   FINGER SURGERY Left    "straightened my pinky"   HAND TENDON SURGERY Left 2013   Left 5th finger   HERNIA REPAIR     "stomach"   ICD IMPLANT N/A 03/02/2018   Procedure: ICD IMPLANT;  Surgeon: [name], MD;  Location: [place] [facility];  Service: Cardiovascular;  Laterality: N/A;   INCISIONAL HERNIA REPAIR N/A 12/25/2014   Procedure: HERNIA REPAIR INCISIONAL WITH MESH;  Surgeon: [name], MD;  Location: [facility];  Service: General;   Laterality: N/A;   INSERTION OF MESH N/A 12/25/2014   Procedure: INSERTION OF MESH;  Surgeon: [name], MD;  Location: [facility];  Service: General;  Laterality: N/A;   IR IMAGING GUIDED PORT INSERTION  04/11/2018   LAPAROSCOPIC CHOLECYSTECTOMY     LOWER EXTREMITY ANGIOGRAM Left 12/30/2015   Procedure: Lower Extremity Angiogram;  Surgeon: [name], MD;  Location: [place] [facility];  Service: Cardiovascular;  Laterality: Left;   PERIPHERAL VASCULAR CATHETERIZATION N/A 12/30/2015   Procedure: Abdominal Aortogram;  Surgeon: [name], MD;  Location: [place] [facility];  Service: Cardiovascular;  Laterality: N/A;   POLYPECTOMY  05/30/2014   Procedure: POLYPECTOMY;  Surgeon: [name], MD;  Location: [facility];  Service: Endoscopy;;   TONSILLECTOMY AND ADENOIDECTOMY  ~ 1970   TYMPANOSTOMY TUBE PLACEMENT Bilateral ~ 1970    Family History  Problem Relation Age of Onset   Vision loss Mother    Ulcers Father    Colon cancer Neg Hx      Social History   Tobacco Use  Smoking Status Every Day   Years: 31.00   Types: Cigarettes  Smokeless Tobacco Never    Social History   Substance and Sexual Activity  Alcohol Use Not Currently   Alcohol/week: 18.0 standard drinks   Types: 18 Cans of beer per week   Comment:  history of heavy ETOH use; 03/02/2018 "6-12 beers Fri & Sat"     Allergies  Allergen Reactions   Lyrica [Pregabalin] Palpitations    Chest pain and heart palps.   Neurontin [Gabapentin] Palpitations    Chest pain and heart palp    Current Facility-Administered Medications  Medication Dose Route Frequency Provider Last Rate Last Admin   acetaminophen (TYLENOL) tablet 650 mg  650 mg Oral Q6H PRN Samuella Cota, MD   650 mg at 10/02/21 1605   Or   acetaminophen (TYLENOL) suppository 650 mg  650 mg Rectal Q6H PRN Samuella Cota, MD       albuterol (PROVENTIL) (2.5 MG/3ML) 0.083% nebulizer solution 2.5 mg  2.5 mg Nebulization Q4H PRN Samuella Cota,  MD       ALPRAZolam [name]) tablet 2 mg  2 mg Oral TID PRN Samuella Cota, MD   2 mg at 10/02/21 1605   amitriptyline (ELAVIL) tablet 100 mg  100 mg Oral QHS Samuella Cota, MD   100 mg at 10/02/21 2111   aspirin EC tablet 81 mg  81 mg Oral Daily Samuella Cota, MD   81 mg at 10/03/21 2353   atorvastatin (LIPITOR) tablet 80 mg  80 mg Oral Daily Samuella Cota, MD   80 mg at 10/03/21 6144   Chlorhexidine Gluconate Cloth 2 % PADS 6 each  6 each Topical Daily Samuella Cota, MD   6 each at  10/02/21 1608   citalopram (CELEXA) tablet 40 mg  40 mg Oral Daily Samuella Cota, MD   40 mg at 10/03/21 0922   fluticasone furoate-vilanterol (BREO ELLIPTA) 100-25 MCG/ACT 1 puff  1 puff Inhalation Daily Samuella Cota, MD   1 puff at 10/03/21 9373   And   umeclidinium bromide (INCRUSE ELLIPTA) 62.5 MCG/ACT 1 puff  1 puff Inhalation Daily Samuella Cota, MD   1 puff at 42/87/68 1157   folic acid (FOLVITE) tablet 1 mg  1 mg Oral Daily Samuella Cota, MD   1 mg at 10/03/21 2620   HYDROcodone-acetaminophen (NORCO) 10-325 MG per tablet 1 tablet  1 tablet Oral Q4H PRN Samuella Cota, MD   1 tablet at 10/01/21 0238   levothyroxine (SYNTHROID) tablet 125 mcg  125 mcg Oral Q0600 Samuella Cota, MD   125 mcg at 10/03/21 0626   midodrine (PROAMATINE) tablet 10 mg  10 mg Oral TID WC Samuella Cota, MD   10 mg at 10/03/21 3559   nicotine (NICODERM CQ - dosed in mg/24 hours) patch 21 mg  21 mg Transdermal Daily Samuella Cota, MD   21 mg at 10/03/21 0925   ondansetron (ZOFRAN) tablet 4 mg  4 mg Oral Q6H PRN Samuella Cota, MD       Or   ondansetron [facility]) injection 4 mg  4 mg Intravenous Q6H PRN Samuella Cota, MD       polyvinyl alcohol (LIQUIFILM TEARS) 1.4 % ophthalmic solution 1 drop  1 drop Both Eyes PRN Samuella Cota, MD       senna-docusate (Senokot-S) tablet 1 tablet  1 tablet Oral QHS PRN Samuella Cota, MD       sodium chloride flush (NS) 0.9 %  injection 10-40 mL  10-40 mL Intracatheter Q12H Samuella Cota, MD   10 mL at 10/02/21 2114   sodium chloride flush (NS) 0.9 % injection 10-40 mL  10-40 mL Intracatheter PRN Samuella Cota, MD       sodium chloride flush (NS) 0.9 % injection 3 mL  3 mL Intravenous Q12H Samuella Cota, MD   3 mL at 10/01/21 0936   sucralfate (CARAFATE) tablet 1 g  1 g Oral TID AC & HS Samuella Cota, MD   1 g at 10/03/21 7416    Review of Systems  Respiratory:  Positive for shortness of breath.   Cardiovascular:  Positive for chest pain.   PHYSICAL EXAMINATION: BP 102/64 (BP Location: Left Arm)    Pulse 89    Temp 98.8 F (37.1 C) (Oral)    Resp 20    Ht 6' (1.829 m)    Wt 75.9 kg    SpO2 93%    BMI 22.69 kg/m   Physical Exam Constitutional:      General: He is not in acute distress.    Appearance: He is ill-appearing.  Cardiovascular:     Rate and Rhythm: Normal rate.  Pulmonary:     Effort: Pulmonary effort is normal.  Neurological:     General: No focal deficit present.     Mental Status: He is alert and oriented to person, place, and time.     Diagnostic Studies & Laboratory data:     Recent Radiology Findings:   CT Angio Chest PE W and/or Wo Contrast  Result Date: 09/30/2021 CLINICAL DATA:  Intermittent sharp chest pain for the last 4 days, lung cancer EXAM: CT ANGIOGRAPHY CHEST  WITH CONTRAST TECHNIQUE: Multidetector CT imaging of the chest was performed using the standard protocol during bolus administration of intravenous contrast. Multiplanar CT image reconstructions and MIPs were obtained to evaluate the vascular anatomy. CONTRAST:  51mL OMNIPAQUE IOHEXOL 350 MG/ML SOLN COMPARISON:  07/23/2021 FINDINGS: Cardiovascular: This is a technically adequate evaluation of the pulmonary vasculature. No filling defects or pulmonary emboli. Large pericardial is effusion is identified, measuring up to 2.3 cm in thickness. The heart is not enlarged. Dual lead pacer is identified. No evidence  of thoracic aortic aneurysm or dissection. Atherosclerosis of the aorta and coronary vasculature. Mediastinum/Nodes: No enlarged mediastinal, hilar, or axillary lymph nodes. Thyroid gland, trachea, and esophagus demonstrate no significant findings. Lungs/Pleura: Stable post therapeutic changes within the right upper lobe, with continued cavitary lesion measuring 4.9 x 3.0 cm. Dense fibrosis and consolidation within the right upper lobe unchanged. Stable 0.8 cm left upper lobe ground-glass pulmonary nodule. Continued bilateral bronchial wall thickening, right greater than left. Trace bilateral pleural fluid. No pneumothorax. Upper Abdomen: No acute abnormality. Musculoskeletal: No acute or destructive bony lesions. Reconstructed images demonstrate no additional findings. Review of the MIP images confirms the above findings. IMPRESSION: 1. No evidence of pulmonary embolus. 2. Stable post therapeutic changes within the right upper lobe, with fibrosis, dense consolidation, and cavitation as above. If recurrent or residual disease is suspected, PET CT could be performed. 3. Stable nonspecific 0.8 cm ground-glass left upper lobe pulmonary nodule. 4. Large pericardial effusion, increased since prior study. 5. Trace bilateral pleural fluid. 6.  Aortic Atherosclerosis (ICD10-I70.0). Electronically Signed   By: Randa Ngo M.D.   On: 09/30/2021 20:37   ECHOCARDIOGRAM COMPLETE  Result Date: 10/01/2021    ECHOCARDIOGRAM REPORT   Patient Name:   [name] Date of Exam: 10/01/2021 Medical Rec #:  [id number]   Height:       72.0 in Accession #:    [id number]  Weight:       171.1 lb Date of Birth:  1961/03/08   BSA:          1.994 m Patient Age:    54 years    BP:           78/57 mmHg Patient Gender: M           HR:           82 bpm. Exam Location:  Inpatient Procedure: 2D Echo, Color Doppler, Cardiac Doppler and Intracardiac            Opacification Agent Indications:    I31.3 Pericardial effusion  History:        Patient has  prior history of Echocardiogram examinations, most                 recent 03/24/2021. Defibrillator, COPD; Risk                 Factors:Dyslipidemia.  Sonographer:    Raquel Sarna Senior RDCS Referring Phys: [id number] VISHAL R PATEL  Sonographer Comments: Very poor echo windows due to COPD and Lung Cancer IMPRESSIONS  1. Poor acoustic windows Definity used. LVEF is depressed. There is akinesis of the mid/distal septum, distal anterior and apical walls. Overall LVEF is probably 30%.  2. Right ventricular systolic function is normal. The right ventricular size is normal.  3. Large pericardial effusion surrounds heart with consolidatation. Measures 17 mm in maximal dimension. More prominent than in echo done in June 2022     Effusion is most prominent at apex and posteriorly  There is mitral inflow variation. The IVC is dilated with blunted respiratory collapse     There is no definite RV collapse, RA collapse, however poor acoustic windows limit apical views.     Concerning for hemodynamic compromise. Case reviewed with W Camnitz.  4. The mitral valve is grossly normal. No evidence of mitral valve regurgitation.  5. The aortic valve is normal in structure. Aortic valve regurgitation is not visualized. FINDINGS  Left Ventricle: Poor acoustic windows Definity used. LVEF is depressed. There is akinesis of the mid/distal septum, distal anterior and apical walls. Overall LVEF is probably 30%. Definity contrast agent was given IV to delineate the left ventricular endocardial borders. The left ventricular internal cavity size was normal in size. There is no left ventricular hypertrophy. Right Ventricle: The right ventricular size is normal. Right vetricular wall thickness was not assessed. Right ventricular systolic function is normal. Left Atrium: Left atrial size was normal in size. Right Atrium: Right atrial size was normal in size. Pericardium: Large pericardial effusion surrounds heart with consolidatation. Measures 17 mm in  maximal dimension. More prominent than in echo done in June 2022 Effusion is most prominent at apex and posteriorly There is mitral inflow variation. The IVC is dilated with blunted respiratory collapse There is no definite RV collapse, RA collapse, however poor acoustic windows limit apical views. Concerning for hemodynamic compromise. Case reviewed with W Camnitz. Mitral Valve: The mitral valve is grossly normal. No evidence of mitral valve regurgitation. Tricuspid Valve: The tricuspid valve is grossly normal. Tricuspid valve regurgitation is not demonstrated. Aortic Valve: The aortic valve is normal in structure. Aortic valve regurgitation is not visualized. Pulmonic Valve: The pulmonic valve was not well visualized. Aorta: The aortic root is normal in size and structure. IAS/Shunts: The interatrial septum was not assessed. Additional Comments: A device lead is visualized.  LEFT VENTRICLE PLAX 2D LVIDd:         4.50 cm LVIDs:         3.30 cm LV PW:         0.90 cm LV IVS:        0.80 cm LVOT diam:     1.90 cm LV SV:         38 LV SV Index:   19 LVOT Area:     2.84 cm  LV Volumes (MOD) LV vol d, MOD A2C: 112.0 ml LV vol d, MOD A4C: 118.0 ml LV vol s, MOD A2C: 74.3 ml LV vol s, MOD A4C: 48.4 ml LV SV MOD A2C:     37.7 ml LV SV MOD A4C:     118.0 ml LV SV MOD BP:      54.2 ml RIGHT VENTRICLE RV S prime:     8.81 cm/s TAPSE (M-mode): 1.1 cm LEFT ATRIUM             Index        RIGHT ATRIUM           Index LA diam:        3.20 cm 1.61 cm/m   RA Area:     13.00 cm LA Vol (A2C):   42.5 ml 21.32 ml/m  RA Volume:   29.90 ml  15.00 ml/m LA Vol (A4C):   50.3 ml 25.23 ml/m LA Biplane Vol: 49.1 ml 24.63 ml/m  AORTIC VALVE LVOT Vmax:   74.60 cm/s LVOT Vmean:  55.700 cm/s LVOT VTI:    0.133 m  AORTA Ao Root diam: 2.50 cm  SHUNTS  Systemic VTI:  0.13 m Systemic Diam: 1.90 cm Dorris Carnes MD Electronically signed by Dorris Carnes MD Signature Date/Time: 10/01/2021/9:41:28 AM    Final    CUP PACEART REMOTE DEVICE CHECK  Result  Date: 09/04/2021 Scheduled remote reviewed. Normal device function.  OptiVol remains elevated. Prior remote from 09/08 sent to triage. Routed again to notify no change. LH      I have independently reviewed the above radiology studies  and reviewed the findings with the patient.   Recent Lab Findings: Lab Results  Component Value Date   WBC 7.7 10/01/2021   HGB 11.0 (L) 10/01/2021   HCT 34.4 (L) 10/01/2021   PLT 147 (L) 10/01/2021   GLUCOSE 111 (H) 10/01/2021   CHOL 93 (L) 01/31/2018   TRIG 81 01/31/2018   HDL 39 (L) 01/31/2018   LDLCALC 38 01/31/2018   ALT 20 09/30/2021   AST 19 09/30/2021   NA 135 10/01/2021   K 3.6 10/01/2021   CL 101 10/01/2021   CREATININE 0.74 10/01/2021   BUN 9 10/01/2021   CO2 27 10/01/2021   TSH 15.414 (H) 10/01/2021   INR 1.0 09/30/2021     Echocardiogram from 10/01/2021  1. Poor acoustic windows Definity used. LVEF is depressed. There is  akinesis of the mid/distal septum, distal anterior and apical walls.  Overall LVEF is probably 30%.   2. Right ventricular systolic function is normal. The right ventricular  size is normal.   3. Large pericardial effusion surrounds heart with consolidatation.  Measures 17 mm in maximal dimension. More prominent than in echo done in  June 2022      Effusion is most prominent at apex and posteriorly      There is mitral inflow variation. The IVC is dilated with blunted  respiratory collapse      There is no definite RV collapse, RA collapse, however poor acoustic  windows limit apical views.      Concerning for hemodynamic compromise. Case reviewed with W Camnitz.   4. The mitral valve is grossly normal. No evidence of mitral valve  regurgitation.   5. The aortic valve is normal in structure. Aortic valve regurgitation is  not visualized.    Assessment / Plan:   61 year old male with history of stage III non-small cell lung cancer of the right upper lobe with mediastinal invasion.  This was originally  diagnosed and treated in 2019.  He did undergo adjuvant and radiation therapy.  He now presents with a large pericardial effusion in the setting of congestive heart failure with an EF of 30%.  We will plan for a left thoracoscopy and pericardial window.  The risks and benefits have been discussed and he is agreeable to proceed.      Lajuana Matte 10/03/2021 10:49 AM

## 2021-10-03 NOTE — Progress Notes (Signed)
Progress Note  Patient Name: [name] Date of Encounter: 10/03/2021  [facility] Cardiologist: [name], MD   Subjective   Mild dyspnea unchanged; no CP  Inpatient Medications    Scheduled Meds:  amitriptyline  100 mg Oral QHS   aspirin EC  81 mg Oral Daily   atorvastatin  80 mg Oral Daily   Chlorhexidine Gluconate Cloth  6 each Topical Daily   citalopram  40 mg Oral Daily   fluticasone furoate-vilanterol  1 puff Inhalation Daily   And   umeclidinium bromide  1 puff Inhalation Daily   folic acid  1 mg Oral Daily   levothyroxine  125 mcg Oral Q0600   midodrine  10 mg Oral TID WC   nicotine  21 mg Transdermal Daily   potassium chloride SA  20 mEq Oral BID   sodium chloride flush  10-40 mL Intracatheter Q12H   sodium chloride flush  3 mL Intravenous Q12H   sucralfate  1 g Oral TID AC & HS   Continuous Infusions:  PRN Meds: acetaminophen **OR** acetaminophen, albuterol, ALPRAZolam, HYDROcodone-acetaminophen, ondansetron **OR** ondansetron (ZOFRAN) IV, polyvinyl alcohol, senna-docusate, sodium chloride flush   Vital Signs    Vitals:   10/02/21 2130 10/02/21 2208 10/03/21 0338 10/03/21 0700  BP: 97/71 105/73 97/64 102/64  Pulse: 78 84 86 89  Resp: (!) 21 20 20 20   Temp:  98.8 F (37.1 C) 98.3 F (36.8 C) 98.8 F (37.1 C)  TempSrc:  Oral Oral Oral  SpO2: 96% 97% 93% 93%  Weight:  76.2 kg 75.9 kg   Height:  6' (1.829 m)      Intake/Output Summary (Last 24 hours) at 10/03/2021 0834 Last data filed at 10/03/2021 0630 Gross per 24 hour  Intake 280 ml  Output 2050 ml  Net -1770 ml    Last 3 Weights 10/03/2021 10/02/2021 10/02/2021  Weight (lbs) 167 lb 5.3 oz 168 lb 173 lb 4.5 oz  Weight (kg) 75.9 kg 76.204 kg 78.6 kg      Telemetry    NSR - Personally Reviewed   Physical Exam   GEN: NAD Neck: supple Cardiac: RRR Respiratory: CTA GI: Soft, NT/ND MS: No edema; s/p right BKA Neuro:  Grossly intact Psych: Normal affect   Labs    High  Sensitivity Troponin:   Recent Labs  Lab 09/30/21 2225  TROPONINIHS 7      Chemistry Recent Labs  Lab 09/30/21 1915 09/30/21 2225 10/01/21 0300  NA 131*  --  135  K 2.9*  --  3.6  CL 97*  --  101  CO2 29  --  27  GLUCOSE 98  --  111*  BUN 13  --  9  CREATININE 0.84  --  0.74  CALCIUM 8.3*  --  8.1*  MG  --  1.5* 1.6*  PROT 6.6  --   --   ALBUMIN 3.4*  --   --   AST 19  --   --   ALT 20  --   --   ALKPHOS 87  --   --   BILITOT 0.7  --   --   GFRNONAA >60  --  >60  ANIONGAP 5  --  7      Hematology Recent Labs  Lab 09/30/21 1915 10/01/21 0300  WBC 11.2* 7.7  RBC 3.56* 3.37*  HGB 11.5* 11.0*  HCT 35.8* 34.4*  MCV 100.6* 102.1*  MCH 32.3 32.6  MCHC 32.1 32.0  RDW 15.7*  15.7*  PLT 179 147*    Thyroid  Recent Labs  Lab 10/01/21 0300  TSH 15.414*     BNP Recent Labs  Lab 09/30/21 1915  BNP 108.2*       Radiology    ECHOCARDIOGRAM COMPLETE  Result Date: 10/01/2021    ECHOCARDIOGRAM REPORT   Patient Name:   ACY ORSAK Date of Exam: 10/01/2021 Medical Rec #:  [id number]   Height:       72.0 in Accession #:    [id number]  Weight:       171.1 lb Date of Birth:  December 13, 1960   BSA:          1.994 m Patient Age:    42 years    BP:           78/57 mmHg Patient Gender: M           HR:           82 bpm. Exam Location:  Inpatient Procedure: 2D Echo, Color Doppler, Cardiac Doppler and Intracardiac            Opacification Agent Indications:    I31.3 Pericardial effusion  History:        Patient has prior history of Echocardiogram examinations, most                 recent 03/24/2021. Defibrillator, COPD; Risk                 Factors:Dyslipidemia.  Sonographer:    Raquel Sarna Senior RDCS Referring Phys: [id number] VISHAL R PATEL  Sonographer Comments: Very poor echo windows due to COPD and Lung Cancer IMPRESSIONS  1. Poor acoustic windows Definity used. LVEF is depressed. There is akinesis of the mid/distal septum, distal anterior and apical walls. Overall LVEF is probably 30%.  2.  Right ventricular systolic function is normal. The right ventricular size is normal.  3. Large pericardial effusion surrounds heart with consolidatation. Measures 17 mm in maximal dimension. More prominent than in echo done in June 2022     Effusion is most prominent at apex and posteriorly     There is mitral inflow variation. The IVC is dilated with blunted respiratory collapse     There is no definite RV collapse, RA collapse, however poor acoustic windows limit apical views.     Concerning for hemodynamic compromise. Case reviewed with W Camnitz.  4. The mitral valve is grossly normal. No evidence of mitral valve regurgitation.  5. The aortic valve is normal in structure. Aortic valve regurgitation is not visualized. FINDINGS  Left Ventricle: Poor acoustic windows Definity used. LVEF is depressed. There is akinesis of the mid/distal septum, distal anterior and apical walls. Overall LVEF is probably 30%. Definity contrast agent was given IV to delineate the left ventricular endocardial borders. The left ventricular internal cavity size was normal in size. There is no left ventricular hypertrophy. Right Ventricle: The right ventricular size is normal. Right vetricular wall thickness was not assessed. Right ventricular systolic function is normal. Left Atrium: Left atrial size was normal in size. Right Atrium: Right atrial size was normal in size. Pericardium: Large pericardial effusion surrounds heart with consolidatation. Measures 17 mm in maximal dimension. More prominent than in echo done in June 2022 Effusion is most prominent at apex and posteriorly There is mitral inflow variation. The IVC is dilated with blunted respiratory collapse There is no definite RV collapse, RA collapse, however poor acoustic windows limit apical views. Concerning for hemodynamic compromise. Case  reviewed with W Camnitz. Mitral Valve: The mitral valve is grossly normal. No evidence of mitral valve regurgitation. Tricuspid Valve: The  tricuspid valve is grossly normal. Tricuspid valve regurgitation is not demonstrated. Aortic Valve: The aortic valve is normal in structure. Aortic valve regurgitation is not visualized. Pulmonic Valve: The pulmonic valve was not well visualized. Aorta: The aortic root is normal in size and structure. IAS/Shunts: The interatrial septum was not assessed. Additional Comments: A device lead is visualized.  LEFT VENTRICLE PLAX 2D LVIDd:         4.50 cm LVIDs:         3.30 cm LV PW:         0.90 cm LV IVS:        0.80 cm LVOT diam:     1.90 cm LV SV:         38 LV SV Index:   19 LVOT Area:     2.84 cm  LV Volumes (MOD) LV vol d, MOD A2C: 112.0 ml LV vol d, MOD A4C: 118.0 ml LV vol s, MOD A2C: 74.3 ml LV vol s, MOD A4C: 48.4 ml LV SV MOD A2C:     37.7 ml LV SV MOD A4C:     118.0 ml LV SV MOD BP:      54.2 ml RIGHT VENTRICLE RV S prime:     8.81 cm/s TAPSE (M-mode): 1.1 cm LEFT ATRIUM             Index        RIGHT ATRIUM           Index LA diam:        3.20 cm 1.61 cm/m   RA Area:     13.00 cm LA Vol (A2C):   42.5 ml 21.32 ml/m  RA Volume:   29.90 ml  15.00 ml/m LA Vol (A4C):   50.3 ml 25.23 ml/m LA Biplane Vol: 49.1 ml 24.63 ml/m  AORTIC VALVE LVOT Vmax:   74.60 cm/s LVOT Vmean:  55.700 cm/s LVOT VTI:    0.133 m  AORTA Ao Root diam: 2.50 cm  SHUNTS Systemic VTI:  0.13 m Systemic Diam: 1.90 cm Dorris Carnes MD Electronically signed by Dorris Carnes MD Signature Date/Time: 10/01/2021/9:41:28 AM    Final      Patient Profile     61 year old male with past medical history of coronary artery disease, ischemic cardiomyopathy, prior ICD, peripheral vascular disease status post right BKA, tobacco abuse, hepatitis C, seizure disorder and lung cancer for evaluation of pericardial effusion.  Patient states admitted with increasing dyspnea and CP. Cardiology now asked to evaluate for large pericardial effusion. CTA showed no pulmonary embolus, right upper lobe consolidation/fibrosis/cavitation, large pericardial effusion  increased in size compared to previous.  Echocardiogram shows ejection fraction 30%, large pericardial effusion increased in size compared to June 2022, mitral inflow variation, IVC dilated with blunted respiratory collapse suggestive of tamponade physiology.   Assessment & Plan    1 large pericardial effusion-patient's echocardiogram previously personally reviewed.  There is a large pericardial effusion with variation of mitral inflow and dilated IVC without collapse.  The effusion is larger compared to June 2022 and is likely malignant in etiology.  Plan is for pericardial window timing per cardiothoracic surgery.   2 coronary artery disease-continue aspirin and statin.   3 ischemic cardiomyopathy-patient is on minimal dose lisinopril at home as he has difficulty with with chronic hypotension (is on midodrine at home).  We will continue off  of lisinopril.   4 status post ICD   5 lung cancer-ongoing therapy with oncology.  Will need follow-up once pericardial effusion addressed.   6 tobacco abuse-patient previously counseled on discontinuing.  For questions or updates, please contact [place] Please consult [email or web address] for contact info under        Signed, Kirk Ruths, MD  10/03/2021, 8:34 AM

## 2021-10-03 NOTE — Progress Notes (Addendum)
PROGRESS NOTE    [name]  HQI:[id number] DOB: [birth year] DOA: 09/30/2021 PCP: Redmond School, MD    Chief Complaint  Patient presents with   Chest Pain    Brief Narrative:  [name] is a 61 y.o. male with medical history significant for CAD s/p PCI, ischemic cardiomyopathy, HFrEF (EF 25-30% by TTE 03/24/2021) s/p AICD, prior h/o PEA arrest in 03/2021 thought due to hypotension, h/o seizure does not want to take AED,  stage III non-small cell lung cancer on chemo, , COPD, PAD s/p right BKA, hypothyroidism, HLD, BPH who presented to the ED on 1/4  for evaluation of pleuritic chest pain,  found to have large pericardial effusion, transferred to [facility] for pericardial window  Cardiology and cardiothoracic surgery following  Subjective:   He is a  very pleasant gentlemen, he is alert, calm and cooperative, but he is a very poor historian,   he is sitting up in bed, on room air, denies pain, denies sob, denies dizziness He states he has stage III lung cancer   Assessment & Plan:   Principal Problem:   Malignant pericardial effusion Active Problems:   PAD (peripheral artery disease) ([place])   COPD with chronic bronchitis and emphysema ([place])   Non-small cell carcinoma of right lung, stage 3 ([facility])   Chronic hypotension   Hypothyroidism   HFrEF (heart failure with reduced ejection fraction) ([place])   S/P implantation of automatic cardioverter/defibrillator (AICD)   CAD (coronary artery disease)   Aortic atherosclerosis ([place])    Pericardial effusion -Presented with pleuritic chest pain, so far vital signs are stable, concerning for malignant effusion -he is transferred from [place] [facility] to [place] for CT surgery for  pericardial window -He has chronic hypotension on midodrine -Management per cardiology and cardiothoracic surgery   Chronic systolic CHF /ischemic cardiomyopathy/CAD DES to LAD Last EF 30%, s/p AICD 02/2018 -No edema, on room air Management per  cardiology  Prolonged QTC, keep on telemetry Keep K above 4, mag above 2  Hypokalemia/hypomagnesemia Status post replacement, repeat lab in the morning   Stage III NSCL lung cancer  First diagnosed in 2019 (The patient underwent AICD placement on March 03, 2018.  Preoperative chest x-ray was performed that day and it showed large right upper lobe mass measuring 8.0 x 6.7 cm. ) Last chemo on 12/6 with Pemetrexed   COPD On trilogy at home , stable, no wheezing, not on home o2   PAD s/p  status post right femoral-tibial bypass followed by BKA  prior h/o PEA arrest in 03/2021 thought due to hypotension  h/o seizure does not want to take AED  Impaired memory, Reports chronic He can not remember the month, immediate recall 2/3 he has  poor short term memory  BPH On Flomax  Hypothyroidism On Synthroid   Smoker Smoking cessation education provided Nicotine patch     Body mass index is 22.69 kg/m.[name]     Unresulted Labs (From admission, onward)     Start     Ordered   10/04/21 1324  Basic metabolic panel  Tomorrow morning,   R       Question:  Specimen collection method  Answer:  Unit=Unit collect   10/03/21 0836              DVT prophylaxis: scd's   Code Status:full, confirmed with patient  Family Communication: patient Disposition:   Status is: Inpatient  Dispo: The patient is from: home  Anticipated d/c is to: TBD              Anticipated d/c date is: TBD                Consultants:  Cardiology cardiothoracic  Procedures:  none  Antimicrobials:   Anti-infectives (From admission, onward)    None           Objective: Vitals:   10/02/21 2208 10/03/21 0338 10/03/21 0700 10/03/21 1112  BP: 105/73 97/64 102/64 90/70  Pulse: 84 86 89 (!) 102  Resp: 20 20 20 18   Temp: 98.8 F (37.1 C) 98.3 F (36.8 C) 98.8 F (37.1 C) 98.9 F (37.2 C)  TempSrc: Oral Oral Oral Oral  SpO2: 97% 93% 93% 96%  Weight: 76.2 kg 75.9 kg     Height: 6' (1.829 m)       Intake/Output Summary (Last 24 hours) at 10/03/2021 1320 Last data filed at 10/03/2021 0630 Gross per 24 hour  Intake 250 ml  Output 1600 ml  Net -1350 ml   Filed Weights   10/02/21 0500 10/02/21 2208 10/03/21 0338  Weight: 78.6 kg 76.2 kg 75.9 kg    Examination:  General exam: alert, awake, communicative,calm, NAD Respiratory system: Clear to auscultation. Respiratory effort normal. +Portacath, +ICD pocket Cardiovascular system:  RRR, distant heart sounds  Gastrointestinal system: Abdomen is nondistended, soft and nontender.  Normal bowel sounds heard. Central nervous system: Alert and oriented. No focal neurological deficits. Extremities:  right bka, no edema Skin: No rashes, lesions or ulcers Psychiatry: calm and cooperative, .     Data Reviewed: I have personally reviewed following labs and imaging studies  CBC: Recent Labs  Lab 09/30/21 1915 10/01/21 0300  WBC 11.2* 7.7  NEUTROABS 8.6*  --   HGB 11.5* 11.0*  HCT 35.8* 34.4*  MCV 100.6* 102.1*  PLT 179 147*    Basic Metabolic Panel: Recent Labs  Lab 09/30/21 1915 09/30/21 2225 10/01/21 0300  NA 131*  --  135  K 2.9*  --  3.6  CL 97*  --  101  CO2 29  --  27  GLUCOSE 98  --  111*  BUN 13  --  9  CREATININE 0.84  --  0.74  CALCIUM 8.3*  --  8.1*  MG  --  1.5* 1.6*    GFR: Estimated Creatinine Clearance: 108.1 mL/min (by C-G formula based on SCr of 0.74 mg/dL).  Liver Function Tests: Recent Labs  Lab 09/30/21 1915  AST 19  ALT 20  ALKPHOS 87  BILITOT 0.7  PROT 6.6  ALBUMIN 3.4*    CBG: No results for input(s): GLUCAP in the last 168 hours.   Recent Results (from the past 240 hour(s))  Resp Panel by RT-PCR (Flu A&B, Covid) Nasopharyngeal Swab     Status: None   Collection Time: 09/30/21  7:15 PM   Specimen: Nasopharyngeal Swab; Nasopharyngeal(NP) swabs in vial transport medium  Result Value Ref Range Status   SARS Coronavirus 2 by RT PCR NEGATIVE NEGATIVE  Final    Comment: (NOTE) SARS-CoV-2 target nucleic acids are NOT DETECTED.  The SARS-CoV-2 RNA is generally detectable in upper respiratory specimens during the acute phase of infection. The lowest concentration of SARS-CoV-2 viral copies this assay can detect is 138 copies/mL. A negative result does not preclude SARS-Cov-2 infection and should not be used as the sole basis for treatment or other patient management decisions. A negative result may occur with  improper specimen collection/handling, submission of  specimen other than nasopharyngeal swab, presence of viral mutation(s) within the areas targeted by this assay, and inadequate number of viral copies(<138 copies/mL). A negative result must be combined with clinical observations, patient history, and epidemiological information. The expected result is Negative.  Fact Sheet for Patients:  EntrepreneurPulse.com.au  Fact Sheet for Healthcare Providers:  IncredibleEmployment.be  This test is no t yet approved or cleared by the [place] FDA and  has been authorized for detection and/or diagnosis of SARS-CoV-2 by FDA under an Emergency Use Authorization (EUA). This EUA will remain  in effect (meaning this test can be used) for the duration of the COVID-19 declaration under Section 564(b)(1) of the Act, 21 U.S.C.section 360bbb-3(b)(1), unless the authorization is terminated  or revoked sooner.       Influenza A by PCR NEGATIVE NEGATIVE Final   Influenza B by PCR NEGATIVE NEGATIVE Final    Comment: (NOTE) The Xpert Xpress SARS-CoV-2/FLU/RSV plus assay is intended as an aid in the diagnosis of influenza from Nasopharyngeal swab specimens and should not be used as a sole basis for treatment. Nasal washings and aspirates are unacceptable for Xpert Xpress SARS-CoV-2/FLU/RSV testing.  Fact Sheet for Patients: EntrepreneurPulse.com.au  Fact Sheet for Healthcare  Providers: IncredibleEmployment.be  This test is not yet approved or cleared by the [place] FDA and has been authorized for detection and/or diagnosis of SARS-CoV-2 by FDA under an Emergency Use Authorization (EUA). This EUA will remain in effect (meaning this test can be used) for the duration of the COVID-19 declaration under Section 564(b)(1) of the Act, 21 U.S.C. section 360bbb-3(b)(1), unless the authorization is terminated or revoked.  Performed at [facility], [place] [address], [place], [place] [address]   Culture, blood (Routine x 2)     Status: None (Preliminary result)   Collection Time: 09/30/21  7:45 PM   Specimen: BLOOD  Result Value Ref Range Status   Specimen Description   Final    BLOOD PORTA CATH Performed at [facility] [address], [place], [place] [address]    Special Requests   Final    BOTTLES DRAWN AEROBIC AND ANAEROBIC Blood Culture adequate volume Performed at [facility] [address] [place], [place] [address]    Culture   Final    NO GROWTH 3 DAYS Performed at [facility], [place] [address] [place], [place] [address]    Report Status PENDING  Incomplete  Culture, blood (Routine x 2)     Status: Abnormal (Preliminary result)   Collection Time: 09/30/21 10:25 PM   Specimen: BLOOD  Result Value Ref Range Status   Specimen Description   Final    BLOOD BLOOD LEFT WRIST Performed at [facility] [address] [place], [place] [address]    Special Requests   Final    BOTTLES DRAWN AEROBIC ONLY Blood Culture results may not be optimal due to an inadequate volume of blood received in culture bottles Performed at [facility] [address] [place],  [address]    Culture  Setup Time   Final    GRAM POSITIVE COCCI IN CLUSTERS AEROBIC BOTTLE ONLY CRITICAL RESULT CALLED TO, READ BACK BY AND VERIFIED  WITH: D,WOFFORD PHARMD @1451  10/02/21 EB    Culture (A)  Final    STAPHYLOCOCCUS EPIDERMIDIS THE SIGNIFICANCE OF ISOLATING THIS ORGANISM FROM A SINGLE SET OF BLOOD CULTURES WHEN MULTIPLE SETS ARE DRAWN IS UNCERTAIN. PLEASE NOTIFY THE MICROBIOLOGY DEPARTMENT WITHIN  ONE WEEK IF SPECIATION AND SENSITIVITIES ARE REQUIRED. Performed at [facility], [place] [address] [place], [place] [address]    Report Status PENDING  Incomplete  Blood Culture ID Panel (Reflexed)     Status: Abnormal   Collection Time: 09/30/21 10:25 PM  Result Value Ref Range Status   Enterococcus faecalis NOT DETECTED NOT DETECTED Final   Enterococcus Faecium NOT DETECTED NOT DETECTED Final   Listeria monocytogenes NOT DETECTED NOT DETECTED Final   Staphylococcus species DETECTED (A) NOT DETECTED Final    Comment: CRITICAL RESULT CALLED TO, READ BACK BY AND VERIFIED WITH: D,WOFFORD PHARMD @1451  10/02/21 EB    Staphylococcus aureus (BCID) NOT DETECTED NOT DETECTED Final   Staphylococcus epidermidis DETECTED (A) NOT DETECTED Final    Comment: CRITICAL RESULT CALLED TO, READ BACK BY AND VERIFIED WITH: D,WOFFORD PHARMD @1451  10/02/21 EB    Staphylococcus lugdunensis NOT DETECTED NOT DETECTED Final   Streptococcus species NOT DETECTED NOT DETECTED Final   Streptococcus agalactiae NOT DETECTED NOT DETECTED Final   Streptococcus pneumoniae NOT DETECTED NOT DETECTED Final   Streptococcus pyogenes NOT DETECTED NOT DETECTED Final   A.calcoaceticus-baumannii NOT DETECTED NOT DETECTED Final   Bacteroides fragilis NOT DETECTED NOT DETECTED Final   Enterobacterales NOT DETECTED NOT DETECTED Final   Enterobacter cloacae complex NOT DETECTED NOT DETECTED Final   Escherichia coli NOT DETECTED NOT DETECTED Final   Klebsiella aerogenes NOT DETECTED NOT DETECTED Final   Klebsiella oxytoca NOT DETECTED NOT DETECTED Final   Klebsiella pneumoniae NOT DETECTED NOT DETECTED Final   Proteus species NOT DETECTED NOT DETECTED Final    Salmonella species NOT DETECTED NOT DETECTED Final   Serratia marcescens NOT DETECTED NOT DETECTED Final   Haemophilus influenzae NOT DETECTED NOT DETECTED Final   Neisseria meningitidis NOT DETECTED NOT DETECTED Final   Pseudomonas aeruginosa NOT DETECTED NOT DETECTED Final   Stenotrophomonas maltophilia NOT DETECTED NOT DETECTED Final   Candida albicans NOT DETECTED NOT DETECTED Final   Candida auris NOT DETECTED NOT DETECTED Final   Candida glabrata NOT DETECTED NOT DETECTED Final   Candida krusei NOT DETECTED NOT DETECTED Final   Candida parapsilosis NOT DETECTED NOT DETECTED Final   Candida tropicalis NOT DETECTED NOT DETECTED Final   Cryptococcus neoformans/gattii NOT DETECTED NOT DETECTED Final   Methicillin resistance mecA/C NOT DETECTED NOT DETECTED Final    Comment: Performed at [facility], [address], [place], [place] [address]  MRSA Next Gen by PCR, Nasal     Status: None   Collection Time: 10/01/21  2:06 AM   Specimen: Nasal Mucosa; Nasal Swab  Result Value Ref Range Status   MRSA by PCR Next Gen NOT DETECTED NOT DETECTED Final    Comment: (NOTE) The GeneXpert MRSA Assay (FDA approved for NASAL specimens only), is one component of a comprehensive MRSA colonization surveillance program. It is not intended to diagnose MRSA infection nor to guide or monitor treatment for MRSA infections. Test performance is not FDA approved in patients less than 15 years old. Performed at [facility], [place] [address], [place],  [address]   Surgical pcr screen     Status: None   Collection Time: 10/02/21 10:16 PM   Specimen: Nasal Mucosa; Nasal Swab  Result Value Ref Range Status   MRSA, PCR NEGATIVE NEGATIVE Final   Staphylococcus aureus NEGATIVE NEGATIVE Final    Comment: (NOTE) The Xpert SA Assay (FDA approved for NASAL specimens in patients 55 years of age and older),  is one component of a comprehensive surveillance program. It is not intended  to diagnose infection nor to guide or monitor treatment. Performed at [facility], [place] [address], [place], [place] [address]          Radiology Studies: No results found.      Scheduled Meds:  amitriptyline  100 mg Oral QHS   aspirin EC  81 mg Oral Daily   atorvastatin  80 mg Oral Daily   Chlorhexidine Gluconate Cloth  6 each Topical Daily   citalopram  40 mg Oral Daily   fluticasone furoate-vilanterol  1 puff Inhalation Daily   And   umeclidinium bromide  1 puff Inhalation Daily   folic acid  1 mg Oral Daily   levothyroxine  125 mcg Oral Q0600   midodrine  10 mg Oral TID WC   nicotine  21 mg Transdermal Daily   sodium chloride flush  10-40 mL Intracatheter Q12H   sodium chloride flush  3 mL Intravenous Q12H   sucralfate  1 g Oral TID AC & HS   Continuous Infusions:   LOS: 2 days   Time spent: 61mins Greater than 50% of this time was spent in counseling, explanation of diagnosis, planning of further management, and coordination of care.   Voice Recognition Viviann Spare dictation system was used to create this note, attempts have been made to correct errors. Please contact the author with questions and/or clarifications.   Florencia Reasons, MD PhD FACP [facility]  Available via Epic secure chat 7am-7pm for nonurgent issues Please page for urgent issues To page the attending provider between 7A-7P or the covering provider during after hours 7P-7A, please log into the web site [email or web address] and access using universal [place] password for that web site. If you do not have the password, please call the hospital operator.    10/03/2021, 1:20 PM

## 2021-10-04 ENCOUNTER — Inpatient Hospital Stay (HOSPITAL_COMMUNITY): Payer: Medicare Other | Admitting: Registered Nurse

## 2021-10-04 ENCOUNTER — Encounter (HOSPITAL_COMMUNITY)
Admission: EM | Disposition: A | Discharge status: Home / Self Care | Payer: Self-pay | Source: Home / Self Care | Attending: Internal Medicine

## 2021-10-04 ENCOUNTER — Inpatient Hospital Stay (HOSPITAL_COMMUNITY): Payer: Medicare Other

## 2021-10-04 DIAGNOSIS — I3139 Other pericardial effusion (noninflammatory): Secondary | ICD-10-CM | POA: Diagnosis not present

## 2021-10-04 HISTORY — PX: VIDEO ASSISTED THORACOSCOPY: SHX5073

## 2021-10-04 LAB — BASIC METABOLIC PANEL
Anion gap: 12 (ref 5–15)
BUN: 9 mg/dL (ref 6–20)
CO2: 24 mmol/L (ref 22–32)
Calcium: 9.1 mg/dL (ref 8.9–10.3)
Chloride: 96 mmol/L — ABNORMAL LOW (ref 98–111)
Creatinine, Ser: 0.98 mg/dL (ref 0.61–1.24)
GFR, Estimated: >60 mL/min (ref >60)
Glucose, Bld: 115 mg/dL — ABNORMAL HIGH (ref 70–99)
Potassium: 3.8 mmol/L (ref 3.5–5.1)
Sodium: 132 mmol/L — ABNORMAL LOW (ref 135–145)

## 2021-10-04 LAB — CULTURE, BLOOD (ROUTINE X 2)

## 2021-10-04 LAB — MAGNESIUM: Magnesium: 1.8 mg/dL (ref 1.7–2.4)

## 2021-10-04 LAB — TYPE AND SCREEN
ABO/RH(D): A POS
Antibody Screen: NEGATIVE

## 2021-10-04 SURGERY — VIDEO ASSISTED THORACOSCOPY
Anesthesia: General | Site: Chest | Laterality: Left

## 2021-10-04 MED ORDER — LACTATED RINGERS IV SOLN: INTRAVENOUS | Status: DC | PRN

## 2021-10-04 MED ORDER — BUPIVACAINE LIPOSOME 1.3 % IJ SUSP
INTRAMUSCULAR | Status: DC | PRN
  Administered 2021-10-04: 100 mL

## 2021-10-04 MED ORDER — SUCCINYLCHOLINE CHLORIDE 200 MG/10ML IV SOSY
PREFILLED_SYRINGE | INTRAVENOUS | Status: DC | PRN
  Administered 2021-10-04: 100 mg via INTRAVENOUS

## 2021-10-04 MED ORDER — MIDAZOLAM HCL 5 MG/5ML IJ SOLN
INTRAMUSCULAR | Status: DC | PRN
  Administered 2021-10-04: 2 mg via INTRAVENOUS

## 2021-10-04 MED ORDER — FENTANYL CITRATE (PF) 250 MCG/5ML IJ SOLN
INTRAMUSCULAR | Status: DC | PRN
  Administered 2021-10-04 (×4): 50 ug via INTRAVENOUS

## 2021-10-04 MED ORDER — PROPOFOL 10 MG/ML IV BOLUS
INTRAVENOUS | Status: AC
  Filled 2021-10-04: qty 20

## 2021-10-04 MED ORDER — ALBUMIN HUMAN 5 % IV SOLN
INTRAVENOUS | Status: AC
  Filled 2021-10-04: qty 250

## 2021-10-04 MED ORDER — DEXAMETHASONE SODIUM PHOSPHATE 10 MG/ML IJ SOLN
INTRAMUSCULAR | Status: DC | PRN
  Administered 2021-10-04: 4 mg via INTRAVENOUS

## 2021-10-04 MED ORDER — ROCURONIUM BROMIDE 10 MG/ML (PF) SYRINGE
PREFILLED_SYRINGE | INTRAVENOUS | Status: DC | PRN
  Administered 2021-10-04: 20 mg via INTRAVENOUS
  Administered 2021-10-04: 50 mg via INTRAVENOUS

## 2021-10-04 MED ORDER — ROCURONIUM BROMIDE 10 MG/ML (PF) SYRINGE
PREFILLED_SYRINGE | INTRAVENOUS | Status: AC
  Filled 2021-10-04: qty 20

## 2021-10-04 MED ORDER — SUGAMMADEX SODIUM 200 MG/2ML IV SOLN
INTRAVENOUS | Status: DC | PRN
Start: 2021-10-04 — End: 2021-10-04
  Administered 2021-10-04: 200 mg via INTRAVENOUS

## 2021-10-04 MED ORDER — ACETAMINOPHEN 10 MG/ML IV SOLN
1000.0000 mg | Freq: Once | INTRAVENOUS | Status: AC
  Administered 2021-10-04: 1000 mg via INTRAVENOUS

## 2021-10-04 MED ORDER — BUPIVACAINE LIPOSOME 1.3 % IJ SUSP
INTRAMUSCULAR | Status: AC
  Filled 2021-10-04: qty 20

## 2021-10-04 MED ORDER — LACTATED RINGERS IV SOLN
INTRAVENOUS | Status: DC | PRN
Start: 2021-10-04 — End: 2021-10-04

## 2021-10-04 MED ORDER — ALBUMIN HUMAN 5 % IV SOLN: 12.5000 g | Freq: Once | INTRAVENOUS | Status: DC

## 2021-10-04 MED ORDER — ETOMIDATE 2 MG/ML IV SOLN
INTRAVENOUS | Status: AC
  Filled 2021-10-04: qty 10

## 2021-10-04 MED ORDER — HYDROMORPHONE HCL 1 MG/ML IJ SOLN: 0.2500 mg | INTRAMUSCULAR | Status: DC | PRN

## 2021-10-04 MED ORDER — ONDANSETRON HCL 4 MG/2ML IJ SOLN
INTRAMUSCULAR | Status: AC
  Filled 2021-10-04: qty 2

## 2021-10-04 MED ORDER — ONDANSETRON HCL 4 MG/2ML IJ SOLN
INTRAMUSCULAR | Status: DC | PRN
  Administered 2021-10-04: 4 mg via INTRAVENOUS

## 2021-10-04 MED ORDER — ACETAMINOPHEN 10 MG/ML IV SOLN
INTRAVENOUS | Status: AC
  Filled 2021-10-04: qty 100

## 2021-10-04 MED ORDER — 0.9 % SODIUM CHLORIDE (POUR BTL) OPTIME
TOPICAL | Status: DC | PRN
  Administered 2021-10-04: 1000 mL

## 2021-10-04 MED ORDER — CEFAZOLIN SODIUM-DEXTROSE 2-3 GM-%(50ML) IV SOLR
INTRAVENOUS | Status: DC | PRN
  Administered 2021-10-04: 2 g via INTRAVENOUS

## 2021-10-04 MED ORDER — DEXAMETHASONE SODIUM PHOSPHATE 10 MG/ML IJ SOLN
INTRAMUSCULAR | Status: AC
  Filled 2021-10-04: qty 1

## 2021-10-04 MED ORDER — ETOMIDATE 2 MG/ML IV SOLN
INTRAVENOUS | Status: DC | PRN
  Administered 2021-10-04: 10 mg via INTRAVENOUS

## 2021-10-04 MED ORDER — CEFAZOLIN SODIUM 1 G IJ SOLR
INTRAMUSCULAR | Status: AC
  Filled 2021-10-04: qty 20

## 2021-10-04 MED ORDER — FENTANYL CITRATE (PF) 250 MCG/5ML IJ SOLN
INTRAMUSCULAR | Status: AC
  Filled 2021-10-04: qty 5

## 2021-10-04 MED ORDER — PHENYLEPHRINE HCL-NACL 20-0.9 MG/250ML-% IV SOLN
INTRAVENOUS | Status: DC | PRN
  Administered 2021-10-04: 20 ug/min via INTRAVENOUS

## 2021-10-04 MED ORDER — MIDAZOLAM HCL 2 MG/2ML IJ SOLN
INTRAMUSCULAR | Status: AC
  Filled 2021-10-04: qty 2

## 2021-10-04 MED ORDER — BUPIVACAINE HCL (PF) 0.5 % IJ SOLN
INTRAMUSCULAR | Status: AC
  Filled 2021-10-04: qty 30

## 2021-10-04 SURGICAL SUPPLY — 100 items
ADH SKN CLS APL DERMABOND .7 (GAUZE/BANDAGES/DRESSINGS) ×1
APL PRP STRL LF DISP 70% ISPRP (MISCELLANEOUS) ×1
APL SRG 22X2 LUM MLBL SLNT (VASCULAR PRODUCTS)
APL SRG 7X2 LUM MLBL SLNT (VASCULAR PRODUCTS)
APL SWBSTK 6 STRL LF DISP (MISCELLANEOUS) ×1
APPLICATOR COTTON TIP 6 STRL (MISCELLANEOUS) IMPLANT
APPLICATOR COTTON TIP 6IN STRL (MISCELLANEOUS) ×2 IMPLANT
APPLICATOR TIP COSEAL (VASCULAR PRODUCTS) IMPLANT
APPLICATOR TIP EXT COSEAL (VASCULAR PRODUCTS) IMPLANT
BLADE CLIPPER SURG (BLADE) ×3 IMPLANT
BLADE SURG 11 STRL SS (BLADE) ×3 IMPLANT
CANISTER SUCT 3000ML PPV (MISCELLANEOUS) ×3 IMPLANT
CATH THORACIC 28FR (CATHETERS) IMPLANT
CATH THORACIC 36FR (CATHETERS) IMPLANT
CATH THORACIC 36FR RT ANG (CATHETERS) IMPLANT
CHLORAPREP W/TINT 26 (MISCELLANEOUS) ×3 IMPLANT
CLEANER TIP ELECTROSURG 2X2 (MISCELLANEOUS) ×3 IMPLANT
CNTNR URN SCR LID CUP LEK RST (MISCELLANEOUS) ×4 IMPLANT
CONN ST 1/4X3/8  BEN (MISCELLANEOUS)
CONN ST 1/4X3/8 BEN (MISCELLANEOUS) IMPLANT
CONN Y 3/8X3/8X3/8  BEN (MISCELLANEOUS)
CONN Y 3/8X3/8X3/8 BEN (MISCELLANEOUS) IMPLANT
CONT SPEC 4OZ STRL OR WHT (MISCELLANEOUS) ×4
COVER SURGICAL LIGHT HANDLE (MISCELLANEOUS) ×3 IMPLANT
DEFOGGER ANTIFOG KIT (MISCELLANEOUS) ×1 IMPLANT
DERMABOND ADVANCED (GAUZE/BANDAGES/DRESSINGS) ×1
DERMABOND ADVANCED .7 DNX12 (GAUZE/BANDAGES/DRESSINGS) IMPLANT
DISSECTOR BLUNT TIP ENDO 5MM (MISCELLANEOUS) IMPLANT
DRAIN CHANNEL 19F RND (DRAIN) ×2 IMPLANT
DRAIN CHANNEL 28F RND 3/8 FF (WOUND CARE) IMPLANT
DRAIN CONNECTOR BLAKE 1:1 (MISCELLANEOUS) ×1 IMPLANT
DRAPE LAPAROSCOPIC ABDOMINAL (DRAPES) ×3 IMPLANT
DRAPE WARM FLUID 44X44 (DRAPES) ×3 IMPLANT
ELECT BLADE 4.0 EZ CLEAN MEGAD (MISCELLANEOUS) ×4
ELECT REM PT RETURN 9FT ADLT (ELECTROSURGICAL) ×2
ELECTRODE BLDE 4.0 EZ CLN MEGD (MISCELLANEOUS) ×2 IMPLANT
ELECTRODE REM PT RTRN 9FT ADLT (ELECTROSURGICAL) ×2 IMPLANT
EVACUATOR SILICONE 100CC (DRAIN) ×1 IMPLANT
GAUZE SPONGE 4X4 12PLY STRL (GAUZE/BANDAGES/DRESSINGS) ×3 IMPLANT
GLOVE SURG ENC MOIS LTX SZ7.5 (GLOVE) ×6 IMPLANT
GOWN STRL REUS W/ TWL LRG LVL3 (GOWN DISPOSABLE) ×6 IMPLANT
GOWN STRL REUS W/ TWL XL LVL3 (GOWN DISPOSABLE) ×2 IMPLANT
GOWN STRL REUS W/TWL LRG LVL3 (GOWN DISPOSABLE) ×6
GOWN STRL REUS W/TWL XL LVL3 (GOWN DISPOSABLE) ×2
KIT BASIN OR (CUSTOM PROCEDURE TRAY) ×3 IMPLANT
KIT SUCTION CATH 14FR (SUCTIONS) ×3 IMPLANT
KIT TURNOVER KIT B (KITS) ×3 IMPLANT
NDL 18GX1X1/2 (RX/OR ONLY) (NEEDLE) IMPLANT
NDL HYPO 25GX1X1/2 BEV (NEEDLE) IMPLANT
NEEDLE 18GX1X1/2 (RX/OR ONLY) (NEEDLE) ×2 IMPLANT
NEEDLE HYPO 25GX1X1/2 BEV (NEEDLE) IMPLANT
NS IRRIG 1000ML POUR BTL (IV SOLUTION) ×12 IMPLANT
PACK CHEST (CUSTOM PROCEDURE TRAY) ×3 IMPLANT
PAD ARMBOARD 7.5X6 YLW CONV (MISCELLANEOUS) ×6 IMPLANT
PASSER SUT SWANSON 36MM LOOP (INSTRUMENTS) IMPLANT
PENCIL BUTTON HOLSTER BLD 10FT (ELECTRODE) ×1 IMPLANT
SCISSORS LAP 5X35 DISP (ENDOMECHANICALS) IMPLANT
SEALANT PROGEL (MISCELLANEOUS) IMPLANT
SEALANT SURG COSEAL 4ML (VASCULAR PRODUCTS) IMPLANT
SEALANT SURG COSEAL 8ML (VASCULAR PRODUCTS) IMPLANT
STOPCOCK 4 WAY LG BORE MALE ST (IV SETS) IMPLANT
SUT PROLENE 3 0 SH DA (SUTURE) IMPLANT
SUT PROLENE 4 0 RB 1 (SUTURE)
SUT PROLENE 4-0 RB1 .5 CRCL 36 (SUTURE) IMPLANT
SUT SILK  1 MH (SUTURE) ×4
SUT SILK 1 MH (SUTURE) ×2 IMPLANT
SUT SILK 1 TIES 10X30 (SUTURE) IMPLANT
SUT SILK 2 0SH CR/8 30 (SUTURE) IMPLANT
SUT SILK 3 0SH CR/8 30 (SUTURE) IMPLANT
SUT VIC AB 1 CTX 18 (SUTURE) IMPLANT
SUT VIC AB 1 CTX 36 (SUTURE)
SUT VIC AB 1 CTX36XBRD ANBCTR (SUTURE) IMPLANT
SUT VIC AB 2-0 CT1 27 (SUTURE) ×2
SUT VIC AB 2-0 CT1 TAPERPNT 27 (SUTURE) ×2 IMPLANT
SUT VIC AB 2-0 CTX 36 (SUTURE) IMPLANT
SUT VIC AB 3-0 SH 27 (SUTURE) ×2
SUT VIC AB 3-0 SH 27X BRD (SUTURE) ×2 IMPLANT
SUT VIC AB 3-0 X1 27 (SUTURE) IMPLANT
SUT VICRYL 0 UR6 27IN ABS (SUTURE) ×5 IMPLANT
SUT VICRYL 2 TP 1 (SUTURE) IMPLANT
SWAB COLLECTION DEVICE MRSA (MISCELLANEOUS) IMPLANT
SWAB CULTURE ESWAB REG 1ML (MISCELLANEOUS) IMPLANT
SYR 10ML LL (SYRINGE) IMPLANT
SYR 20ML LL LF (SYRINGE) IMPLANT
SYR 50ML LL SCALE MARK (SYRINGE) ×1 IMPLANT
SYSTEM SAHARA CHEST DRAIN ATS (WOUND CARE) ×3 IMPLANT
TAPE CLOTH 4X10 WHT NS (GAUZE/BANDAGES/DRESSINGS) ×3 IMPLANT
TAPE CLOTH SURG 4X10 WHT LF (GAUZE/BANDAGES/DRESSINGS) ×1 IMPLANT
TAPE UMBILICAL COTTON 1/8X30 (MISCELLANEOUS) ×3 IMPLANT
TIP APPLICATOR SPRAY EXTEND 16 (VASCULAR PRODUCTS) IMPLANT
TOWEL GREEN STERILE (TOWEL DISPOSABLE) ×3 IMPLANT
TOWEL GREEN STERILE FF (TOWEL DISPOSABLE) ×3 IMPLANT
TRAP SPECIMEN MUCUS 40CC (MISCELLANEOUS) IMPLANT
TRAY FOLEY SLVR 16FR LF STAT (SET/KITS/TRAYS/PACK) ×3 IMPLANT
TROCAR XCEL 12X100 BLDLESS (ENDOMECHANICALS) ×3 IMPLANT
TROCAR XCEL BLADELESS 5X75MML (TROCAR) ×1 IMPLANT
TUBING EXTENTION W/L.L. (IV SETS) IMPLANT
TUBING LAP HI FLOW INSUFFLATIO (TUBING) ×1 IMPLANT
TUNNELER SHEATH ON-Q 11GX8 DSP (PAIN MANAGEMENT) IMPLANT
WATER STERILE IRR 1000ML POUR (IV SOLUTION) ×6 IMPLANT

## 2021-10-04 NOTE — Anesthesia Procedure Notes (Addendum)
Procedure Name: Intubation Date/Time: 10/04/2021 8:14 AM Performed by: Wilburn Cornelia, CRNA Pre-anesthesia Checklist: Patient identified, Emergency Drugs available, Suction available, Patient being monitored and Timeout performed Patient Re-evaluated:Patient Re-evaluated prior to induction Oxygen Delivery Method: Circle system utilized Preoxygenation: Pre-oxygenation with 100% oxygen Induction Type: IV induction Ventilation: Mask ventilation without difficulty Laryngoscope Size: Mac and 4 Grade View: Grade I Endobronchial tube: Double lumen EBT and 39 Fr Number of attempts: 1 Airway Equipment and Method: Stylet Placement Confirmation: ETT inserted through vocal cords under direct vision, positive ETCO2, CO2 detector and breath sounds checked- equal and bilateral Secured at: 31 cm Tube secured with: Tape Dental Injury: Teeth and Oropharynx as per pre-operative assessment

## 2021-10-04 NOTE — Progress Notes (Deleted)
Cardiology Office Note Date:  10/04/2021  Patient ID:  Mark, Costa 11-Apr-1961, MRN [id number] PCP:  Redmond School, MD  Cardiologist:  Dr. Oval Linsey Electrophysiologist: Dr. Curt Bears  ***refresh   Chief Complaint: *** annual visit  History of Present Illness: [name] is a 61 y.o. male with history of ICM, chronic CHF (systolic/diastolic), PVD (hx of Right fem-tibial bypass > eventual right BKA), CAD (STEMI most recent PCI, LAD 2017), Lung Ca (stage IIIa squamous cell lung cancer), DVT, COPD, chronic hepatitis.  Actively undergoing chemotherapy with carboplatin and alimta.  He was hospitalized 03/2021 after being found down in the bathroom at chemo. Hypotensive on arrival treated with norepinephrine. He is hypotensive at baseline, but recently had poor PO intake in the setting of CHF medications. It was felt his episode was consistent with PEA arrest due to hypotension. Following stabilization and extubation, he had a seizure and was transferred to [facility] for neuro workup. He had an additional seizure at [facility]. He had been noncompliant with keppra and was subsequently placed on vimpat. Echocardiogram during that hospitalization showed continued LVEF of 25-30% with WMA, grade 2 DD, small pericardial effusion anterior to right ventricle without tamponade.  No significant valvular disease  He was admitted to [facility] 09/30/21 with pleuritic chest pain and underwent CTA chest which was negative for PE but showed interval increase in a pericardial effusion, read as large. Not felt to have ACS, known baseline hypotension, he was not tachycardic Echocardiogram shows ejection fraction 30%, large pericardial effusion increased in size compared to June 2022, mitral inflow variation, IVC dilated with blunted respiratory collapse suggestive of tamponade physiology. Planned for pericardial window  ***  *** symptoms *** volume *** CM, CAD meds ***   Device information MDT single chamber ICD implanted  03/02/2018   Past Medical History:  Diagnosis Date   Acute hepatitis C virus infection 06/19/2007   Qualifier: Diagnosis of  By: Leilani Merl CMA, Tiffany     Acute ST elevation myocardial infarction (STEMI) involving left anterior descending (LAD) coronary artery ([place]) 05/26/2016   Acute ST elevation myocardial infarction (STEMI) of lateral wall (HCC) 05/26/2016   AICD (automatic cardioverter/defibrillator) present 03/02/2018   Anxiety    Asthma    Atherosclerosis of native arteries of the extremities with intermittent claudication 12/16/2011   Chronic hepatitis C without hepatic coma ([place]) 05/03/2014   COPD with chronic bronchitis and emphysema ([place]) 03/13/2018   FEV1 57%   Depression    DVT (deep venous thrombosis) ([place]) 2009; 2019   RLE; LLE   Encounter for antineoplastic chemotherapy 03/22/2018   Hepatitis C    "tx'd in 2015"   High cholesterol    Ischemic cardiomyopathy 03/02/2018   Non-small cell carcinoma of right lung, stage 3 ([place]) 03/22/2018   PAD (peripheral artery disease) ([place]) 01/25/2013   Peripheral vascular disease, unspecified 07/20/2012   Port-A-Cath in place 04/24/2018   ST elevation myocardial infarction involving left anterior descending (LAD) coronary artery (HCC)    STEMI (ST elevation myocardial infarction) ([place]) 05/26/2016   Tobacco abuse 01/28/2016   Tubular adenoma of colon 07/11/2014   Urinary dribbling     Past Surgical History:  Procedure Laterality Date   AORTOGRAM  08/08/2009   for LLE claudication     By Dr. 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   BIOPSY N/A 05/30/2014   Procedure: BIOPSY;  Surgeon: [name], MD;  Location: [facility];  Service: Endoscopy;  Laterality: N/A;   BLADDER TUMOR  EXCISION  8/812   CARDIAC CATHETERIZATION N/A 05/26/2016   Procedure: Left Heart Cath and Coronary Angiography;  Surgeon: [name], MD;  Location: [place] [facility];  Service: Cardiovascular;  Laterality: N/A;   CARDIAC CATHETERIZATION N/A 05/26/2016    Procedure: Coronary Stent Intervention;  Surgeon: [name], MD;  Location: [place] [facility];  Service: Cardiovascular;  Laterality: N/A;   COLONOSCOPY WITH PROPOFOL N/A 05/30/2014   FTD:DUKGUR colonic polyp-likely source of hematochezia-removed as described above   ESOPHAGOGASTRODUODENOSCOPY (EGD) WITH PROPOFOL N/A 05/30/2014   KYH:CWCBJS and bulbar erosions s/p gastric biopsy. No evidence of portal gastropathy on today's examination.   FEMORAL-TIBIAL BYPASS GRAFT  2009   Right side using non-reversed GSV   By Dr. Anson [place] BYPASS GRAFT  11/24/2007   Right femoral to anterior tibial BPG   by Dr. Oneida Alar   FINGER SURGERY Left    "straightened my pinky"   HAND TENDON SURGERY Left 2013   Left 5th finger   HERNIA REPAIR     "stomach"   ICD IMPLANT N/A 03/02/2018   Procedure: ICD IMPLANT;  Surgeon: [name], MD;  Location: [place] [facility];  Service: Cardiovascular;  Laterality: N/A;   INCISIONAL HERNIA REPAIR N/A 12/25/2014   Procedure: HERNIA REPAIR INCISIONAL WITH MESH;  Surgeon: [name], MD;  Location: [facility];  Service: General;  Laterality: N/A;   INSERTION OF MESH N/A 12/25/2014   Procedure: INSERTION OF MESH;  Surgeon: [name], MD;  Location: [facility];  Service: General;  Laterality: N/A;   IR IMAGING GUIDED PORT INSERTION  04/11/2018   LAPAROSCOPIC CHOLECYSTECTOMY     LOWER EXTREMITY ANGIOGRAM Left 12/30/2015   Procedure: Lower Extremity Angiogram;  Surgeon: [name], MD;  Location: [place] [facility];  Service: Cardiovascular;  Laterality: Left;   PERIPHERAL VASCULAR CATHETERIZATION N/A 12/30/2015   Procedure: Abdominal Aortogram;  Surgeon: [name], MD;  Location: [place] [facility];  Service: Cardiovascular;  Laterality: N/A;   POLYPECTOMY  05/30/2014   Procedure: POLYPECTOMY;  Surgeon: [name], MD;  Location: [facility];  Service: Endoscopy;;   TONSILLECTOMY AND ADENOIDECTOMY  ~ 1970   TYMPANOSTOMY TUBE PLACEMENT Bilateral ~  1970    No current facility-administered medications for this visit.   No current outpatient medications on file.   Facility-Administered Medications Ordered in Other Visits  Medication Dose Route Frequency Provider Last Rate Last Admin   acetaminophen (OFIRMEV) 10 MG/ML IV            acetaminophen (TYLENOL) tablet 650 mg  650 mg Oral Q6H PRN Samuella Cota, MD   650 mg at 10/02/21 1605   Or   acetaminophen (TYLENOL) suppository 650 mg  650 mg Rectal Q6H PRN Samuella Cota, MD       albumin human 5 % solution            albuterol (PROVENTIL) (2.5 MG/3ML) 0.083% nebulizer solution 2.5 mg  2.5 mg Nebulization Q4H PRN Samuella Cota, MD       ALPRAZolam [name]) tablet 2 mg  2 mg Oral TID PRN Samuella Cota, MD   2 mg at 10/03/21 1602   amitriptyline (ELAVIL) tablet 100 mg  100 mg Oral QHS Samuella Cota, MD   100 mg at 10/03/21 2058   aspirin EC tablet 81 mg  81 mg Oral Daily Samuella Cota, MD   81 mg at 10/03/21 2831   atorvastatin (LIPITOR)  tablet 80 mg  80 mg Oral Daily Samuella Cota, MD   80 mg at 10/03/21 5277   Chlorhexidine Gluconate Cloth 2 % PADS 6 each  6 each Topical Daily Samuella Cota, MD   6 each at 10/03/21 1000   citalopram (CELEXA) tablet 40 mg  40 mg Oral Daily Samuella Cota, MD   40 mg at 10/03/21 0922   fluticasone furoate-vilanterol (BREO ELLIPTA) 100-25 MCG/ACT 1 puff  1 puff Inhalation Daily Samuella Cota, MD   1 puff at 10/03/21 8242   And   umeclidinium bromide (INCRUSE ELLIPTA) 62.5 MCG/ACT 1 puff  1 puff Inhalation Daily Samuella Cota, MD   1 puff at 35/36/14 4315   folic acid (FOLVITE) tablet 1 mg  1 mg Oral Daily Samuella Cota, MD   1 mg at 10/03/21 4008   HYDROcodone-acetaminophen (NORCO) 10-325 MG per tablet 1 tablet  1 tablet Oral Q4H PRN Samuella Cota, MD   1 tablet at 10/01/21 0238   levothyroxine (SYNTHROID) tablet 125 mcg  125 mcg Oral Q0600 Samuella Cota, MD   125 mcg at 10/03/21 0626    midodrine (PROAMATINE) tablet 10 mg  10 mg Oral TID WC Samuella Cota, MD   10 mg at 10/04/21 1208   nicotine (NICODERM CQ - dosed in mg/24 hours) patch 21 mg  21 mg Transdermal Daily Samuella Cota, MD   21 mg at 10/03/21 0925   ondansetron (ZOFRAN) tablet 4 mg  4 mg Oral Q6H PRN Samuella Cota, MD       Or   ondansetron [facility]) injection 4 mg  4 mg Intravenous Q6H PRN Samuella Cota, MD       polyvinyl alcohol (LIQUIFILM TEARS) 1.4 % ophthalmic solution 1 drop  1 drop Both Eyes PRN Samuella Cota, MD       senna-docusate (Senokot-S) tablet 1 tablet  1 tablet Oral QHS PRN Samuella Cota, MD       sodium chloride flush (NS) 0.9 % injection 10-40 mL  10-40 mL Intracatheter Q12H Samuella Cota, MD   10 mL at 10/03/21 2059   sodium chloride flush (NS) 0.9 % injection 10-40 mL  10-40 mL Intracatheter PRN Samuella Cota, MD       sodium chloride flush (NS) 0.9 % injection 3 mL  3 mL Intravenous Q12H Samuella Cota, MD   3 mL at 10/01/21 0936   sucralfate (CARAFATE) tablet 1 g  1 g Oral TID AC & HS Samuella Cota, MD   1 g at 10/04/21 1208    Allergies:   Lyrica [pregabalin] and Neurontin [gabapentin]   Social History:  The patient  reports that he has been smoking cigarettes. He has never used smokeless tobacco. He reports that he does not currently use alcohol after a past usage of about 18.0 standard drinks per week. He reports current drug use. Drug: Marijuana.   Family History:  The patient's family history includes Ulcers in his father; Vision loss in his mother.  ROS:  Please see the history of present illness.    All other systems are reviewed and otherwise negative.   PHYSICAL EXAM:  VS:  There were no vitals taken for this visit. BMI: There is no height or weight on file to calculate BMI. Well nourished, well developed, in no acute distress HEENT: normocephalic, atraumatic Neck: no JVD, carotid bruits or masses Cardiac:  *** RRR; no significant  murmurs, no  rubs, or gallops Lungs:  *** CTA b/l, no wheezing, rhonchi or rales Abd: soft, nontender MS: no deformity or *** atrophy Ext: *** no edema Skin: warm and dry, no rash Neuro:  No gross deficits appreciated Psych: euthymic mood, full affect  *** ICD site is stable, no tethering or discomfort   EKG:  not done today  Device interrogation done today and reviewed by myself:  ***   10/01/2021: TTE 1. Poor acoustic windows Definity used. LVEF is depressed. There is  akinesis of the mid/distal septum, distal anterior and apical walls.  Overall LVEF is probably 30%.   2. Right ventricular systolic function is normal. The right ventricular  size is normal.   3. Large pericardial effusion surrounds heart with consolidatation.  Measures 17 mm in maximal dimension. More prominent than in echo done in  June 2022      Effusion is most prominent at apex and posteriorly      There is mitral inflow variation. The IVC is dilated with blunted  respiratory collapse      There is no definite RV collapse, RA collapse, however poor acoustic  windows limit apical views.      Concerning for hemodynamic compromise. Case reviewed with W Camnitz.   4. The mitral valve is grossly normal. No evidence of mitral valve  regurgitation.   5. The aortic valve is normal in structure. Aortic valve regurgitation is  not visualized.    TTE 02/07/18 - Left ventricle: Poorly visualized. The cavity size was mildly   dilated. Systolic function was severely reduced. The estimated   ejection fraction was in the range of 20% to 25%. Severe diffuse   hypokinesis with distinct regional wall motion abnormalities.   There is akinesis of the apicalanterior, lateral, and apical   myocardium. Features are consistent with a pseudonormal left   ventricular filling pattern, with concomitant abnormal relaxation   and increased filling pressure (grade 2 diastolic dysfunction). - Left atrium: The atrium was mildly  dilated.   LHC 05/26/16: A STENT XIENCE ALPINE RX [id number] drug eluting stent was successfully placed, and does not overlap previously placed stent. Prox LAD lesion, 100 %stenosed. Post intervention, there is a 0% residual stenosis. LV end diastolic pressure is mildly elevated. There is mild to moderate left ventricular systolic dysfunction.  Recent Labs: 09/30/2021: ALT 20; B Natriuretic Peptide 108.2 10/01/2021: Hemoglobin 11.0; Platelets 147; TSH 15.414 10/04/2021: BUN 9; Creatinine, Ser 0.98; Magnesium 1.8; Potassium 3.8; Sodium 132  No results found for requested labs within last 8760 hours.   Estimated Creatinine Clearance: 86.8 mL/min (by C-G formula based on SCr of 0.98 mg/dL).   Wt Readings from Last 3 Encounters:  10/04/21 164 lb 10.9 oz (74.7 kg)  09/01/21 180 lb 14.4 oz (82.1 kg)  08/10/21 184 lb 3.2 oz (83.6 kg)     Other studies reviewed: Additional studies/records reviewed today include: summarized above  ASSESSMENT AND PLAN:  ICD ***  CAD ***  ICM Chronic CHF (Combined) ***  5. Pericardial effusion *** likely 2/2 cancer *** s/p    Disposition: F/u with ***  Current medicines are reviewed at length with the patient today.  The patient did not have any concerns regarding medicines.  Venetia Night, PA-C 10/04/2021 1:25 PM     [address] [place] [address] [phone] (office)  [phone] (fax)

## 2021-10-04 NOTE — Progress Notes (Signed)
°   °  [address]       [place] [address]             [phone]       No events  Vitals:   10/03/21 2300 10/04/21 0427  BP: 106/79 (!) 89/68  Pulse: 81 90  Resp: 18 20  Temp: 98.4 F (36.9 C) 98.2 F (36.8 C)  SpO2: 94% 95%   Alert NAD Sinus  EWOB  OR today for L VATS, pericardial window  [name] O Jule Schlabach

## 2021-10-04 NOTE — Op Note (Signed)
° °   °  [address]       [place] [address]             [phone]        10/04/2021  Patient:  [name] Pre-Op Dx: Non-small cell lung cancer.   Pericardial effusion   Cardiomyopathy Post-op Dx: Same Procedure: -Left video assisted thoracoscopy - Pericardial Window - Evacuation of pericardial effusion   Surgeon and Role:      * [name], Mark Crater, MD - Primary  Anesthesia  general EBL: 10 ml Blood Administration: None Specimen: Pericardial fluid, pericardium  Drains: 10 French Blake drain in the pericardium, and 19 [place] Blake drain in the left chest   Counts: correct   Indications: This is a 61 year old male with history of non-small cell lung cancer the presented to [facility] with a pericardial effusion concerning for malignancy.  He was transferred to [facility] for surgical drainage.  Findings: The lung was adherent to the pericardium.  Adhesions had to be lysed for full mobilization.  The pericardium did not appear thickened.  There was a bloody pericardial effusion that was released.  Operative Technique: After the risks, benefits and alternatives were thoroughly discussed, the patient was brought to the operative theatre.  Anesthesia was induced, and the patient was then placed in a  lateral decubitus position and was prepped and draped in normal sterile fashion.  An appropriate surgical pause was performed, and pre-operative antibiotics were dosed accordingly.  We began with 3cm incision in the anterior axillary line at the 4th intercostal space.  A 54mm trocar was then introduced.  The lung was mobilized off of the pericardium.  The pericardial sac was then incised, and a 3 x 3  cm window was created anterior to the phrenic nerve.  The pericardial fluid was bloody.  The pericardium was removed, and 78F blake drain was then passed into the pericardial sac.  We watch the remaining lobes re-expand.  The skin and soft tissue were closed with  absorbable suture    The patient tolerated the procedure without any immediate complications, and was transferred to the PACU in stable condition.  [name]

## 2021-10-04 NOTE — Progress Notes (Signed)
PROGRESS NOTE    [name]  QIW:[id number] DOB: [birth year] DOA: 09/30/2021 PCP: Redmond School, MD    Chief Complaint  Patient presents with   Chest Pain    Brief Narrative:  [name] is a 60 y.o. male with medical history significant for CAD s/p PCI, ischemic cardiomyopathy, HFrEF (EF 25-30% by TTE 03/24/2021) s/p AICD, prior h/o PEA arrest in 03/2021 thought due to hypotension, h/o seizure does not want to take AED,  stage III non-small cell lung cancer on chemo, , COPD, PAD s/p right BKA, hypothyroidism, HLD, BPH who presented to the ED on 1/4  for evaluation of pleuritic chest pain,  found to have large pericardial effusion, transferred to [facility] for pericardial window  Cardiology and cardiothoracic surgery consulted   Subjective:  He is seen after returned from [place], he reports some pain at the drain exit site , otherwise he is stable, playing cards with his friend  Assessment & Plan:   Principal Problem:   Malignant pericardial effusion Active Problems:   PAD (peripheral artery disease) ([place])   COPD with chronic bronchitis and emphysema ([place])   Non-small cell carcinoma of right lung, stage 3 (HCC)   Chronic hypotension   Hypothyroidism   HFrEF (heart failure with reduced ejection fraction) ([place])   S/P implantation of automatic cardioverter/defibrillator (AICD)   CAD (coronary artery disease)   Aortic atherosclerosis ([place])    Pericardial effusion -Presented with pleuritic chest pain, so far vital signs are stable, concerning for malignant effusion -he is transferred from [place] to [place] for CT surgery for  pericardial window which is placed on 1/8 -Management per cardiology and cardiothoracic surgery   Chronic systolic CHF /ischemic cardiomyopathy/CAD DES to LAD Last EF 30%, s/p AICD 02/2018 -No edema, on room air -Management per cardiology   chronic hypotension, on midodrine  Prolonged QTC, keep on telemetry Keep K above 4, mag above  2  Hypokalemia/hypomagnesemia Status post replacement, repeat lab in the morning   Stage III NSCL lung cancer  First diagnosed in 2019 (The patient underwent AICD placement on March 03, 2018.  Preoperative chest x-ray was performed that day and it showed large right upper lobe mass measuring 8.0 x 6.7 cm. ) Last chemo on 12/6 with Pemetrexed   COPD On trilogy at home , stable, no wheezing, not on home o2   PAD s/p  status post right femoral-tibial bypass followed by BKA  prior h/o PEA arrest in 03/2021 thought due to hypotension  h/o seizure does not want to take AED  Impaired memory, Reports chronic He can not remember the month, immediate recall 2/3 he has  poor short term memory  BPH On Flomax  Hypothyroidism On Synthroid   Smoker Smoking cessation education provided Nicotine patch     Body mass index is 22.34 kg/m.[name]     Unresulted Labs (From admission, onward)     Start     Ordered   10/05/21 0500  CBC  Tomorrow morning,   R       Question:  Specimen collection method  Answer:  Unit=Unit collect   10/04/21 2223   10/05/21 4174  Basic metabolic panel  Tomorrow morning,   R       Question:  Specimen collection method  Answer:  Unit=Unit collect   10/04/21 2223   10/05/21 0500  Magnesium  Tomorrow morning,   R       Question:  Specimen collection method  Answer:  Unit=Unit collect  10/04/21 2223              DVT prophylaxis: scd's   Code Status:full, confirmed with patient  Family Communication: patient Disposition:   Status is: Inpatient  Dispo: The patient is from: home               Anticipated d/c is to: TBD              Anticipated d/c date is: TBD                Consultants:  Cardiology cardiothoracic  Procedures:  Pericardial window placement  Antimicrobials:   Anti-infectives (From admission, onward)    None           Objective: Vitals:   10/04/21 1120 10/04/21 1137 10/04/21 1701 10/04/21 1939  BP: 101/75   105/79 96/64  Pulse: 83  88 78  Resp: 10  16 17   Temp: (!) 97.5 F (36.4 C) (!) 97.3 F (36.3 C) 98.1 F (36.7 C) 98 F (36.7 C)  TempSrc:  Oral Oral Oral  SpO2: 100%  100% 100%  Weight:      Height:        Intake/Output Summary (Last 24 hours) at 10/04/2021 2226 Last data filed at 10/04/2021 1702 Gross per 24 hour  Intake 1010 ml  Output 1123 ml  Net -113 ml   Filed Weights   10/02/21 2208 10/03/21 0338 10/04/21 0533  Weight: 76.2 kg 75.9 kg 74.7 kg    Examination:  General exam: alert, awake, communicative,calm, NAD Respiratory system: Clear to auscultation. Respiratory effort normal. +Portacath, +ICD pocket Cardiovascular system:  RRR,  pericardial window drain from left chest Gastrointestinal system: Abdomen is nondistended, soft and nontender.  Normal bowel sounds heard. Central nervous system: Alert and oriented. No focal neurological deficits. Extremities:  right bka, no edema Skin: No rashes, lesions or ulcers Psychiatry: calm and cooperative, .     Data Reviewed: I have personally reviewed following labs and imaging studies  CBC: Recent Labs  Lab 09/30/21 1915 10/01/21 0300  WBC 11.2* 7.7  NEUTROABS 8.6*  --   HGB 11.5* 11.0*  HCT 35.8* 34.4*  MCV 100.6* 102.1*  PLT 179 147*    Basic Metabolic Panel: Recent Labs  Lab 09/30/21 1915 09/30/21 2225 10/01/21 0300 10/04/21 0643  NA 131*  --  135 132*  K 2.9*  --  3.6 3.8  CL 97*  --  101 96*  CO2 29  --  27 24  GLUCOSE 98  --  111* 115*  BUN 13  --  9 9  CREATININE 0.84  --  0.74 0.98  CALCIUM 8.3*  --  8.1* 9.1  MG  --  1.5* 1.6* 1.8    GFR: Estimated Creatinine Clearance: 86.8 mL/min (by C-G formula based on SCr of 0.98 mg/dL).  Liver Function Tests: Recent Labs  Lab 09/30/21 1915  AST 19  ALT 20  ALKPHOS 87  BILITOT 0.7  PROT 6.6  ALBUMIN 3.4*    CBG: No results for input(s): GLUCAP in the last 168 hours.   Recent Results (from the past 240 hour(s))  Resp Panel by RT-PCR  (Flu A&B, Covid) Nasopharyngeal Swab     Status: None   Collection Time: 09/30/21  7:15 PM   Specimen: Nasopharyngeal Swab; Nasopharyngeal(NP) swabs in vial transport medium  Result Value Ref Range Status   SARS Coronavirus 2 by RT PCR NEGATIVE NEGATIVE Final    Comment: (NOTE) SARS-CoV-2 target nucleic acids are  NOT DETECTED.  The SARS-CoV-2 RNA is generally detectable in upper respiratory specimens during the acute phase of infection. The lowest concentration of SARS-CoV-2 viral copies this assay can detect is 138 copies/mL. A negative result does not preclude SARS-Cov-2 infection and should not be used as the sole basis for treatment or other patient management decisions. A negative result may occur with  improper specimen collection/handling, submission of specimen other than nasopharyngeal swab, presence of viral mutation(s) within the areas targeted by this assay, and inadequate number of viral copies(<138 copies/mL). A negative result must be combined with clinical observations, patient history, and epidemiological information. The expected result is Negative.  Fact Sheet for Patients:  EntrepreneurPulse.com.au  Fact Sheet for Healthcare Providers:  IncredibleEmployment.be  This test is no t yet approved or cleared by the [place] FDA and  has been authorized for detection and/or diagnosis of SARS-CoV-2 by FDA under an Emergency Use Authorization (EUA). This EUA will remain  in effect (meaning this test can be used) for the duration of the COVID-19 declaration under Section 564(b)(1) of the Act, 21 U.S.C.section 360bbb-3(b)(1), unless the authorization is terminated  or revoked sooner.       Influenza A by PCR NEGATIVE NEGATIVE Final   Influenza B by PCR NEGATIVE NEGATIVE Final    Comment: (NOTE) The Xpert Xpress SARS-CoV-2/FLU/RSV plus assay is intended as an aid in the diagnosis of influenza from Nasopharyngeal swab specimens  and should not be used as a sole basis for treatment. Nasal washings and aspirates are unacceptable for Xpert Xpress SARS-CoV-2/FLU/RSV testing.  Fact Sheet for Patients: EntrepreneurPulse.com.au  Fact Sheet for Healthcare Providers: IncredibleEmployment.be  This test is not yet approved or cleared by the [place] FDA and has been authorized for detection and/or diagnosis of SARS-CoV-2 by FDA under an Emergency Use Authorization (EUA). This EUA will remain in effect (meaning this test can be used) for the duration of the COVID-19 declaration under Section 564(b)(1) of the Act, 21 U.S.C. section 360bbb-3(b)(1), unless the authorization is terminated or revoked.  Performed at [facility], [place] [address], [place], [place] [address]   Culture, blood (Routine x 2)     Status: None (Preliminary result)   Collection Time: 09/30/21  7:45 PM   Specimen: BLOOD  Result Value Ref Range Status   Specimen Description   Final    BLOOD PORTA CATH Performed at [place] [address], [place], [place] [address]    Special Requests   Final    BOTTLES DRAWN AEROBIC AND ANAEROBIC Blood Culture adequate volume Performed at [place] [address], [place], [place] [address]    Culture   Final    NO GROWTH 4 DAYS Performed at [facility], [place] [address], [place], [place] [address]    Report Status PENDING  Incomplete  Culture, blood (Routine x 2)     Status: Abnormal   Collection Time: 09/30/21 10:25 PM   Specimen: BLOOD  Result Value Ref Range Status   Specimen Description   Final    BLOOD BLOOD LEFT WRIST Performed at [place] [address] [place], [place] [address]    Special Requests   Final    BOTTLES DRAWN AEROBIC ONLY Blood Culture results may not be optimal due to an inadequate volume of blood received in culture bottles Performed at [place] [address] [place],  [address]    Culture  Setup Time  Final    GRAM POSITIVE COCCI IN CLUSTERS AEROBIC BOTTLE ONLY CRITICAL RESULT CALLED TO, READ BACK BY AND VERIFIED WITH: D,WOFFORD PHARMD @1451  10/02/21 EB    Culture (A)  Final    STAPHYLOCOCCUS EPIDERMIDIS THE SIGNIFICANCE OF ISOLATING THIS ORGANISM FROM A SINGLE SET OF BLOOD CULTURES WHEN MULTIPLE SETS ARE DRAWN IS UNCERTAIN. PLEASE NOTIFY THE MICROBIOLOGY DEPARTMENT WITHIN ONE WEEK IF SPECIATION AND SENSITIVITIES ARE REQUIRED. Performed at [facility], [place] [address], [place], [place] [address]    Report Status 10/04/2021 FINAL  Final  Blood Culture ID Panel (Reflexed)     Status: Abnormal   Collection Time: 09/30/21 10:25 PM  Result Value Ref Range Status   Enterococcus faecalis NOT DETECTED NOT DETECTED Final   Enterococcus Faecium NOT DETECTED NOT DETECTED Final   Listeria monocytogenes NOT DETECTED NOT DETECTED Final   Staphylococcus species DETECTED (A) NOT DETECTED Final    Comment: CRITICAL RESULT CALLED TO, READ BACK BY AND VERIFIED WITH: D,WOFFORD PHARMD @1451  10/02/21 EB    Staphylococcus aureus (BCID) NOT DETECTED NOT DETECTED Final   Staphylococcus epidermidis DETECTED (A) NOT DETECTED Final    Comment: CRITICAL RESULT CALLED TO, READ BACK BY AND VERIFIED WITH: D,WOFFORD PHARMD @1451  10/02/21 EB    Staphylococcus lugdunensis NOT DETECTED NOT DETECTED Final   Streptococcus species NOT DETECTED NOT DETECTED Final   Streptococcus agalactiae NOT DETECTED NOT DETECTED Final   Streptococcus pneumoniae NOT DETECTED NOT DETECTED Final   Streptococcus pyogenes NOT DETECTED NOT DETECTED Final   A.calcoaceticus-baumannii NOT DETECTED NOT DETECTED Final   Bacteroides fragilis NOT DETECTED NOT DETECTED Final   Enterobacterales NOT DETECTED NOT DETECTED Final   Enterobacter cloacae complex NOT DETECTED NOT DETECTED Final   Escherichia coli NOT DETECTED NOT DETECTED Final   Klebsiella  aerogenes NOT DETECTED NOT DETECTED Final   Klebsiella oxytoca NOT DETECTED NOT DETECTED Final   Klebsiella pneumoniae NOT DETECTED NOT DETECTED Final   Proteus species NOT DETECTED NOT DETECTED Final   Salmonella species NOT DETECTED NOT DETECTED Final   Serratia marcescens NOT DETECTED NOT DETECTED Final   Haemophilus influenzae NOT DETECTED NOT DETECTED Final   Neisseria meningitidis NOT DETECTED NOT DETECTED Final   Pseudomonas aeruginosa NOT DETECTED NOT DETECTED Final   Stenotrophomonas maltophilia NOT DETECTED NOT DETECTED Final   Candida albicans NOT DETECTED NOT DETECTED Final   Candida auris NOT DETECTED NOT DETECTED Final   Candida glabrata NOT DETECTED NOT DETECTED Final   Candida krusei NOT DETECTED NOT DETECTED Final   Candida parapsilosis NOT DETECTED NOT DETECTED Final   Candida tropicalis NOT DETECTED NOT DETECTED Final   Cryptococcus neoformans/gattii NOT DETECTED NOT DETECTED Final   Methicillin resistance mecA/C NOT DETECTED NOT DETECTED Final    Comment: Performed at [facility], [address] [place], [place] [address]  MRSA Next Gen by PCR, Nasal     Status: None   Collection Time: 10/01/21  2:06 AM   Specimen: Nasal Mucosa; Nasal Swab  Result Value Ref Range Status   MRSA by PCR Next Gen NOT DETECTED NOT DETECTED Final    Comment: (NOTE) The GeneXpert MRSA Assay (FDA approved for NASAL specimens only), is one component of a comprehensive MRSA colonization surveillance program. It is not intended to diagnose MRSA infection nor to guide or monitor treatment for MRSA infections. Test performance is not FDA approved in patients less than 53 years old. Performed at [facility], [place] [address], [place], [place] [address]  Surgical pcr screen     Status: None   Collection Time: 10/02/21 10:16 PM   Specimen: Nasal Mucosa; Nasal Swab  Result Value Ref Range Status   MRSA, PCR NEGATIVE NEGATIVE Final   Staphylococcus aureus NEGATIVE  NEGATIVE Final    Comment: (NOTE) The Xpert SA Assay (FDA approved for NASAL specimens in patients 59 years of age and older), is one component of a comprehensive surveillance program. It is not intended to diagnose infection nor to guide or monitor treatment. Performed at [facility], [place] [address] [place], [place] [address]          Radiology Studies: DG CHEST PORT 1 VIEW  Result Date: 10/04/2021 CLINICAL DATA:  Reason for exam: Chest tube, central line, port placement Post operative EXAM: PORTABLE CHEST 1 VIEW COMPARISON:  03/24/2021.  Chest CT, 09/30/2021. FINDINGS: Cardiac silhouette normal in size. Stable left anterior chest wall AICD. No mediastinal or left hilar masses. Right hilum is retracted superiorly, mostly obscured by right upper lobe, apical opacity, stable from the prior chest radiograph. Remainder of the lungs is clear. Two chest tubes on the left, 1 curling over the left apex, the other extending over the cardiac silhouette, tip superimposed over the right heart border. No convincing pneumothorax.  No pleural effusion. Small amount of subcutaneous air is seen along the lateral lower left hemithorax. Right internal jugular Port-A-Cath and left internal jugular dual lumen central venous catheters. Both are stable from the recent CT. IMPRESSION: 1. Two left-sided chest tubes as detailed. No convincing left pneumothorax. 2. Right upper lobe/apical opacity unchanged from the recent CT, consistent with chronic scarring. Remainder of the lungs is clear. Electronically Signed   By: Lajean Manes M.D.   On: 10/04/2021 10:57        Scheduled Meds:  amitriptyline  100 mg Oral QHS   aspirin EC  81 mg Oral Daily   atorvastatin  80 mg Oral Daily   Chlorhexidine Gluconate Cloth  6 each Topical Daily   citalopram  40 mg Oral Daily   fluticasone furoate-vilanterol  1 puff Inhalation Daily   And   umeclidinium bromide  1 puff Inhalation Daily   folic acid  1 mg Oral Daily    levothyroxine  125 mcg Oral Q0600   midodrine  10 mg Oral TID WC   nicotine  21 mg Transdermal Daily   sodium chloride flush  10-40 mL Intracatheter Q12H   sodium chloride flush  3 mL Intravenous Q12H   sucralfate  1 g Oral TID AC & HS   Continuous Infusions:  albumin human       LOS: 3 days   Time spent: 36mins Greater than 50% of this time was spent in counseling, explanation of diagnosis, planning of further management, and coordination of care.   Voice Recognition Viviann Spare dictation system was used to create this note, attempts have been made to correct errors. Please contact the author with questions and/or clarifications.   Florencia Reasons, MD PhD FACP [facility]  Available via Epic secure chat 7am-7pm for nonurgent issues Please page for urgent issues To page the attending provider between 7A-7P or the covering provider during after hours 7P-7A, please log into the web site [email or web address] and access using universal  password for that web site. If you do not have the password, please call the hospital operator.    10/04/2021, 10:26 PM

## 2021-10-04 NOTE — Anesthesia Procedure Notes (Signed)
Central Venous Catheter Insertion Performed by: Roderic Palau, MD, anesthesiologist Start/End1/04/2022 7:30 AM, 10/04/2021 7:45 AM Patient location: Pre-op. Preanesthetic checklist: patient identified, IV checked, site marked, risks and benefits discussed, surgical consent, monitors and equipment checked, pre-op evaluation, timeout performed and anesthesia consent Position: Trendelenburg Lidocaine 1% used for infiltration and patient sedated Hand hygiene performed , maximum sterile barriers used  and Seldinger technique used Catheter size: 8 Fr Total catheter length 16. Central line was placed.Double lumen Procedure performed using ultrasound guided technique. Ultrasound Notes:anatomy identified, needle tip was noted to be adjacent to the nerve/plexus identified, no ultrasound evidence of intravascular and/or intraneural injection and image(s) printed for medical record Attempts: 1 Following insertion, dressing applied, line sutured and Biopatch. Post procedure assessment: blood return through all ports  Patient tolerated the procedure well with no immediate complications.

## 2021-10-04 NOTE — Anesthesia Postprocedure Evaluation (Signed)
Anesthesia Post Note  Patient: [name]  Procedure(s) Performed: PERICARDIAL WINDOW VIDEO ASSISTED THORACOSCOPY APPROACH (Left: Chest)     Patient location during evaluation: PACU Anesthesia Type: General Level of consciousness: awake and alert Pain management: pain level controlled Vital Signs Assessment: post-procedure vital signs reviewed and stable Respiratory status: spontaneous breathing, nonlabored ventilation, respiratory function stable and patient connected to nasal cannula oxygen Cardiovascular status: blood pressure returned to baseline and stable Postop Assessment: no apparent nausea or vomiting Anesthetic complications: no   No notable events documented.  Last Vitals:  Vitals:   10/04/21 1120 10/04/21 1137  BP: 101/75   Pulse: 83   Resp: 10   Temp: (!) 36.4 C (!) 36.3 C  SpO2: 100%     Last Pain:  Vitals:   10/04/21 1137  TempSrc: Oral  PainSc:                  Gloyd Happ,W. EDMOND

## 2021-10-04 NOTE — Transfer of Care (Signed)
Immediate Anesthesia Transfer of Care Note  Patient: [name]  Procedure(s) Performed: PERICARDIAL WINDOW VIDEO ASSISTED THORACOSCOPY APPROACH (Left: Chest)  Patient Location: PACU  Anesthesia Type:General  Level of Consciousness: awake and alert   Airway & Oxygen Therapy: Patient Spontanous Breathing and Patient connected to nasal cannula oxygen  Post-op Assessment: Report given to RN and Post -op Vital signs reviewed and stable  Post vital signs: Reviewed and stable  Last Vitals:  Vitals Value Taken Time  BP 105/68 10/04/21 0950  Temp 36.1 C 10/04/21 0950  Pulse 92 10/04/21 0950  Resp 20 10/04/21 0950  SpO2 100 % 10/04/21 0950  Vitals shown include unvalidated device data.  Last Pain:  Vitals:   10/04/21 0427  TempSrc: Oral  PainSc:       Patients Stated Pain Goal: 0 (17/71/16 5790)  Complications: No notable events documented.

## 2021-10-04 NOTE — Anesthesia Procedure Notes (Signed)
Arterial Line Insertion Start/End1/04/2022 7:40 AM, 10/04/2021 7:45 AM Performed by: Wilburn Cornelia, CRNA, CRNA  Patient location: Pre-op. Preanesthetic checklist: patient identified, IV checked, site marked, risks and benefits discussed, surgical consent, monitors and equipment checked, pre-op evaluation, timeout performed and anesthesia consent Lidocaine 1% used for infiltration and patient sedated Right, radial was placed Catheter size: 20 G Hand hygiene performed  and maximum sterile barriers used   Attempts: 1 Procedure performed without using ultrasound guided technique. Following insertion, Biopatch and dressing applied. Post procedure assessment: normal

## 2021-10-04 NOTE — Anesthesia Preprocedure Evaluation (Addendum)
Anesthesia Evaluation  Patient identified by MRN, date of birth, ID band Patient awake    Reviewed: Allergy & Precautions, H&P , NPO status , Patient's Chart, lab work & pertinent test results  Airway Mallampati: I  TM Distance: >3 FB Neck ROM: Full    Dental no notable dental hx. (+) Edentulous Upper, Edentulous Lower, Dental Advisory Given   Pulmonary asthma , COPD,  COPD inhaler, Current Smoker and Patient abstained from smoking.,    Pulmonary exam normal breath sounds clear to auscultation       Cardiovascular + CAD, + Past MI and + Peripheral Vascular Disease  + Cardiac Defibrillator  Rhythm:Regular Rate:Normal     Neuro/Psych Anxiety Depression negative neurological ROS     GI/Hepatic negative GI ROS, (+) Hepatitis -  Endo/Other  Hypothyroidism   Renal/GU negative Renal ROS  negative genitourinary   Musculoskeletal   Abdominal   Peds  Hematology negative hematology ROS (+)   Anesthesia Other Findings   Reproductive/Obstetrics negative OB ROS                            Anesthesia Physical Anesthesia Plan  ASA: 3  Anesthesia Plan: General   Post-op Pain Management: Ofirmev IV (intra-op)   Induction: Intravenous  PONV Risk Score and Plan: 2 and Ondansetron, Dexamethasone and Midazolam  Airway Management Planned: Double Lumen EBT  Additional Equipment: Arterial line, CVP and Ultrasound Guidance Line Placement  Intra-op Plan:   Post-operative Plan: Extubation in OR  Informed Consent: I have reviewed the patients History and Physical, chart, labs and discussed the procedure including the risks, benefits and alternatives for the proposed anesthesia with the patient or authorized representative who has indicated his/her understanding and acceptance.     Dental advisory given  Plan Discussed with: CRNA  Anesthesia Plan Comments:        Anesthesia Quick Evaluation

## 2021-10-05 ENCOUNTER — Encounter (HOSPITAL_COMMUNITY): Payer: Self-pay | Admitting: Thoracic Surgery (Cardiothoracic Vascular Surgery)

## 2021-10-05 LAB — CULTURE, BLOOD (ROUTINE X 2)
Culture: NO GROWTH
Special Requests: ADEQUATE

## 2021-10-05 LAB — BASIC METABOLIC PANEL
Anion gap: 8 (ref 5–15)
BUN: 9 mg/dL (ref 6–20)
CO2: 28 mmol/L (ref 22–32)
Calcium: 8.3 mg/dL — ABNORMAL LOW (ref 8.9–10.3)
Chloride: 98 mmol/L (ref 98–111)
Creatinine, Ser: 0.8 mg/dL (ref 0.61–1.24)
GFR, Estimated: >60 mL/min (ref >60)
Glucose, Bld: 115 mg/dL — ABNORMAL HIGH (ref 70–99)
Potassium: 4.1 mmol/L (ref 3.5–5.1)
Sodium: 134 mmol/L — ABNORMAL LOW (ref 135–145)

## 2021-10-05 LAB — CBC
HCT: 34.2 % — ABNORMAL LOW (ref 39.0–52.0)
Hemoglobin: 11.1 g/dL — ABNORMAL LOW (ref 13.0–17.0)
MCH: 31.8 pg (ref 26.0–34.0)
MCHC: 32.5 g/dL (ref 30.0–36.0)
MCV: 98 fL (ref 80.0–100.0)
Platelets: 188 10*3/uL (ref 150–400)
RBC: 3.49 MIL/uL — ABNORMAL LOW (ref 4.22–5.81)
RDW: 14.9 % (ref 11.5–15.5)
WBC: 12 10*3/uL — ABNORMAL HIGH (ref 4.0–10.5)
nRBC: 0 % (ref 0.0–0.2)

## 2021-10-05 LAB — MAGNESIUM: Magnesium: 1.7 mg/dL (ref 1.7–2.4)

## 2021-10-05 MED ORDER — MAGNESIUM SULFATE 2 GM/50ML IV SOLN
2.0000 g | Freq: Once | INTRAVENOUS | Status: AC
  Administered 2021-10-05: 2 g via INTRAVENOUS
  Filled 2021-10-05: qty 50

## 2021-10-05 MED ORDER — KETOROLAC TROMETHAMINE 30 MG/ML IJ SOLN
30.0000 mg | Freq: Four times a day (QID) | INTRAMUSCULAR | Status: AC
  Administered 2021-10-05 – 2021-10-08 (×12): 30 mg via INTRAVENOUS
  Filled 2021-10-05 (×12): qty 1

## 2021-10-05 MED ORDER — ALBUTEROL SULFATE (2.5 MG/3ML) 0.083% IN NEBU: 2.5000 mg | INHALATION_SOLUTION | RESPIRATORY_TRACT | Status: DC | PRN

## 2021-10-05 MED ORDER — ALBUTEROL SULFATE (2.5 MG/3ML) 0.083% IN NEBU
2.5000 mg | INHALATION_SOLUTION | Freq: Four times a day (QID) | RESPIRATORY_TRACT | Status: DC
  Administered 2021-10-05: 2.5 mg via RESPIRATORY_TRACT
  Filled 2021-10-05: qty 3

## 2021-10-05 NOTE — TOC Progression Note (Signed)
Transition of Care [facility]) - Progression Note    Patient Details  Name: [name] MRN: [id number] Date of Birth: [birth year]  Transition of Care [facility]) CM/SW [place], RN Phone [phone]  10/05/2021, 2:00 PM  Clinical Narrative:     Transition of Care [facility]) Screening Note   Patient Details  Name: [name] Date of Birth: 11-01-1960   Transition of Care [facility]) CM/SW Contact:    Angelita Ingles, RN Phone Number: 10/05/2021, 2:01 PM    Transition of Care Department [facility]) has reviewed patient and no TOC needs have been identified at this time. We will continue to monitor patient advancement through interdisciplinary progression rounds. If new patient transition needs arise, please place a TOC consult.          Expected Discharge Plan and Services                                                 Social Determinants of Health (SDOH) Interventions    Readmission Risk Interventions No flowsheet data found.

## 2021-10-05 NOTE — Progress Notes (Signed)
Progress Note  Patient Name: [name] Date of Encounter: 10/05/2021  Primary Cardiologist: [name], MD   Subjective   Patient is seen and examined at his bedside. He was sitting up when I arrived he report some muscle soreness.  Inpatient Medications    Scheduled Meds:  amitriptyline  100 mg Oral QHS   aspirin EC  81 mg Oral Daily   atorvastatin  80 mg Oral Daily   Chlorhexidine Gluconate Cloth  6 each Topical Daily   citalopram  40 mg Oral Daily   fluticasone furoate-vilanterol  1 puff Inhalation Daily   And   umeclidinium bromide  1 puff Inhalation Daily   folic acid  1 mg Oral Daily   ketorolac  30 mg Intravenous Q6H   levothyroxine  125 mcg Oral Q0600   midodrine  10 mg Oral TID WC   nicotine  21 mg Transdermal Daily   sodium chloride flush  10-40 mL Intracatheter Q12H   sodium chloride flush  3 mL Intravenous Q12H   sucralfate  1 g Oral TID AC & HS   Continuous Infusions:  magnesium sulfate bolus IVPB     PRN Meds: acetaminophen **OR** acetaminophen, albuterol, ALPRAZolam, HYDROcodone-acetaminophen, ondansetron **OR** ondansetron (ZOFRAN) IV, polyvinyl alcohol, senna-docusate, sodium chloride flush   Vital Signs    Vitals:   10/04/21 1939 10/05/21 0007 10/05/21 0443 10/05/21 0700  BP: 96/64 92/67  93/76  Pulse: 78 75 70 96  Resp: 17 16 19 17   Temp: 98 F (36.7 C) 98.2 F (36.8 C) (!) 97.4 F (36.3 C) 98.1 F (36.7 C)  TempSrc: Oral Oral Oral Oral  SpO2: 100% 95% 90% 98%  Weight:   75.2 kg   Height:        Intake/Output Summary (Last 24 hours) at 10/05/2021 0847 Last data filed at 10/05/2021 9811 Gross per 24 hour  Intake 1720 ml  Output 1628 ml  Net 92 ml   Filed Weights   10/03/21 0338 10/04/21 0533 10/05/21 0443  Weight: 75.9 kg 74.7 kg 75.2 kg    Telemetry    Sinus rhythm - Personally Reviewed  ECG    None today - Personally Reviewed  Physical Exam     General: Comfortable, sitting up in a chair Head: Atraumatic, normal  size  Eyes: PEERLA, EOMI  Neck: Supple, normal JVD Cardiac: Normal S1, S2; RRR; no murmurs, rubs, or gallops Lungs: Clear to auscultation bilaterally Abd: Soft, nontender, no hepatomegaly  Ext: warm, no edema Musculoskeletal: No deformities, BUE and BLE strength normal and equal Skin: Warm and dry, no rashes   Neuro: Alert and oriented to person, place, time, and situation, CNII-XII grossly intact, no focal deficits  Psych: Normal mood and affect   Labs    Chemistry Recent Labs  Lab 09/30/21 1915 10/01/21 0300 10/04/21 0643 10/05/21 0032  NA 131* 135 132* 134*  K 2.9* 3.6 3.8 4.1  CL 97* 101 96* 98  CO2 29 27 24 28   GLUCOSE 98 111* 115* 115*  BUN 13 9 9 9   CREATININE 0.84 0.74 0.98 0.80  CALCIUM 8.3* 8.1* 9.1 8.3*  PROT 6.6  --   --   --   ALBUMIN 3.4*  --   --   --   AST 19  --   --   --   ALT 20  --   --   --   ALKPHOS 87  --   --   --   BILITOT 0.7  --   --   --  GFRNONAA >60 >60 >60 >60  ANIONGAP 5 7 12 8      Hematology Recent Labs  Lab 09/30/21 1915 10/01/21 0300 10/05/21 0032  WBC 11.2* 7.7 12.0*  RBC 3.56* 3.37* 3.49*  HGB 11.5* 11.0* 11.1*  HCT 35.8* 34.4* 34.2*  MCV 100.6* 102.1* 98.0  MCH 32.3 32.6 31.8  MCHC 32.1 32.0 32.5  RDW 15.7* 15.7* 14.9  PLT 179 147* 188    Cardiac EnzymesNo results for input(s): TROPONINI in the last 168 hours. No results for input(s): TROPIPOC in the last 168 hours.   BNP Recent Labs  Lab 09/30/21 1915  BNP 108.2*     DDimer No results for input(s): DDIMER in the last 168 hours.   Radiology    DG CHEST PORT 1 VIEW  Result Date: 10/04/2021 CLINICAL DATA:  Reason for exam: Chest tube, central line, port placement Post operative EXAM: PORTABLE CHEST 1 VIEW COMPARISON:  03/24/2021.  Chest CT, 09/30/2021. FINDINGS: Cardiac silhouette normal in size. Stable left anterior chest wall AICD. No mediastinal or left hilar masses. Right hilum is retracted superiorly, mostly obscured by right upper lobe, apical opacity,  stable from the prior chest radiograph. Remainder of the lungs is clear. Two chest tubes on the left, 1 curling over the left apex, the other extending over the cardiac silhouette, tip superimposed over the right heart border. No convincing pneumothorax.  No pleural effusion. Small amount of subcutaneous air is seen along the lateral lower left hemithorax. Right internal jugular Port-A-Cath and left internal jugular dual lumen central venous catheters. Both are stable from the recent CT. IMPRESSION: 1. Two left-sided chest tubes as detailed. No convincing left pneumothorax. 2. Right upper lobe/apical opacity unchanged from the recent CT, consistent with chronic scarring. Remainder of the lungs is clear. Electronically Signed   By: Lajean Manes M.D.   On: 10/04/2021 10:57    Cardiac Studies   TTE 10/01/2021 IMPRESSIONS   1. Poor acoustic windows Definity used. LVEF is depressed. There is akinesis of the mid/distal septum, distal anterior and apical walls. Overall LVEF is probably 30%.   2. Right ventricular systolic function is normal. The right ventricular size is normal.   3. Large pericardial effusion surrounds heart with consolidatation. Measures 17 mm in maximal dimension. More prominent than in echo done in June 2022     Effusion is most prominent at apex and posteriorly . There is mitral inflow variation. The IVC is dilated with blunted respiratory collapse      There is no definite RV collapse, RA collapse, however poor acoustic windows limit apical views. Concerning for hemodynamic compromise. Case reviewed with W Camnitz.   4. The mitral valve is grossly normal. No evidence of mitral valve regurgitation.   5. The aortic valve is normal in structure. Aortic valve regurgitation is not visualized.   FINDINGS   Left Ventricle: Poor acoustic windows Definity used. LVEF is depressed.  There is akinesis of the mid/distal septum, distal anterior and apical walls. Overall LVEF is probably 30%. Definity  contrast agent was given IV to delineate the left ventricular endocardial borders. The left ventricular internal cavity size was normal  in size. There is no left ventricular hypertrophy.   Right Ventricle: The right ventricular size is normal. Right vetricular  wall thickness was not assessed. Right ventricular systolic function is  normal.   Left Atrium: Left atrial size was normal in size.   Right Atrium: Right atrial size was normal in size.   Pericardium: Large pericardial effusion  surrounds heart with consolidatation. Measures 17 mm in maximal dimension. More prominent than in echo done in June 2022 Effusion is most prominent at apex and posteriorly There is mitral inflow variation. The IVC is dilated with blunted respiratory collapse  There is no definite RV collapse, RA collapse, however poor acoustic windows limit apical views. Concerning for hemodynamic compromise. Case reviewed with W Camnitz.   Mitral Valve: The mitral valve is grossly normal. No evidence of mitral  valve regurgitation.   Tricuspid Valve: The tricuspid valve is grossly normal. Tricuspid valve  regurgitation is not demonstrated.   Aortic Valve: The aortic valve is normal in structure. Aortic valve  regurgitation is not visualized.   Pulmonic Valve: The pulmonic valve was not well visualized.   Aorta: The aortic root is normal in size and structure.   IAS/Shunts: The interatrial septum was not assessed.   Additional Comments: A device lead is visualized.       Patient Profile     61 y.o. male CAD status post PCI, ischemic cardiomyopathy, heart failure reduced ejection fraction 25 to 30% by TTE 03/24/2021, status post AICD, history of PEA arrest 03/2021 thought to be secondary to hypotension, history of seizure, does not want to take AED, stage III non-small cell lung cancer on chemo, COPD, PAD status post right BKA, hypothyroidism, BPH found to have large pleural effusion status post pericardial window on  10/04/2020 by CT surgery.  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  Chest tube to suction. Recommend limited echo prior to discharge.  Coronary artery disease-no anginal symptoms continue aspirin and statin.  Ischemic cardiomyopathy status post ICD-due to hypotension very hard to optimize his medical management.  Was on minimal lisinopril agree with not starting patient back on his medication for now.  Lung cancer follows with oncology.  Tobacco use-on the nicotine patch tells me is helping.  For questions or updates, please contact [place] Please consult [email or web address] for contact info under Cardiology/STEMI.      Signed, Berniece Salines, DO  10/05/2021, 8:47 AM

## 2021-10-05 NOTE — Progress Notes (Signed)
PROGRESS NOTE    Mark WORLDS  NTI:[id number] DOB: 01-14-1961 DOA: 09/30/2021 PCP: Redmond School, MD   Brief Narrative: 62 year old with past medical history significant for CAD status post PCI, ischemic cardiomyopathy, heart failure reduced ejection fraction 25 to 30% by TTE 03/24/2021, status post AICD, history of PEA arrest 03/2021 thought to be secondary to hypotension, history of seizure, does not want to take AED, stage III non-small cell lung cancer on chemo, COPD, PAD status post right BKA, hypothyroidism, BPH who presented to the ED 1/4 for evaluation of pleuritic chest pain, found to have large pericardial effusion, transferred to [facility] for pericardial window.  Patient underwent left video-assisted thoracic Koska P, pericardial window and evacuation of pericardial effusion by Dr. Kipp Brood on 10/04/2021.    Assessment & Plan:   Principal Problem:   Malignant pericardial effusion Active Problems:   PAD (peripheral artery disease) (HCC)   COPD with chronic bronchitis and emphysema (HCC)   Non-small cell carcinoma of right lung, stage 3 (HCC)   Chronic hypotension   Hypothyroidism   HFrEF (heart failure with reduced ejection fraction) (HCC)   S/P implantation of automatic cardioverter/defibrillator (AICD)   CAD (coronary artery disease)   Aortic atherosclerosis (HCC)  1-Pericardial Effusion: -Patient presented with pleuritic chest pain found to have pericardial effusion, concern for malignant effusion - Left video-assisted thoracic Koska P, pericardial window and evacuation of pericardial effusion by Dr. Kipp Brood on 10/04/2021. -Chest tube still in place.  Management per cardiothoracic surgery -Follow-up pathology -CVTS planning on removing chest tube once output less than 867  Chronic systolic heart failure/ischemic cardiomyopathy/CAD DES to LAD Last EF 30% status post AICD 02/2018 -Cardiology following -Holding lisinopril due to soft BP. Has history of chronic  hypotension on midodrine at home.   Chronic hypotension Continue midodrine  Prolonged QT: Replete magnesium. Monitor.   Hypokalemia/hypomagnesemia: Replete mg IV .  K 4.1  Stage III NSCL Lung Cancer -First diagnosed in 2019 Last chemo 12/6 with pemetrexed Followed by Dr. Earlie Server  COPD: On trilogy at home Continue with Memory Dance and Incruse. Will add scheduled albuterol. Incentive spirometry   PAD status post right femoral-tibial bypass followed by BKA  Prior history of PEA arrest 03/2021 thought to be secondary to hypotension:   History of seizure; he does not want to take AED  Impaired memory, chronic History of poor short-term B 12  BPH: Continue Flomax  Hypothyroidism: Continues  Current smoker: Continue counseling.   Chronic tissue injury: Patient was advised to change frequent position     Estimated body mass index is 22.48 kg/m as calculated from the following:   Height as of this encounter: 6' (1.829 m).   Weight as of this encounter: 75.2 kg.   DVT prophylaxis: SCD Code Status: Full code Family Communication: care discussed with patient Disposition Plan:  Status is: Inpatient  Remains inpatient appropriate because: treatment for pericardial effusion, SP pericardial window 1/08        Consultants:  Cardiology CVTS, Dr Kipp Brood.   Procedures:   left video-assisted thoracic Koska P, pericardial window and evacuation of pericardial effusion by Dr. Kipp Brood on 10/04/2021.   Antimicrobials:  None  Subjective: Report pain when he takes deep breath and with cough.  He repot sore in his buttock goes to his testicle, he has been using triple antibiotics ointment at home .   Objective: Vitals:   10/04/21 1939 10/05/21 0007 10/05/21 0443 10/05/21 0700  BP: 96/64 92/67  93/76  Pulse: 78 75 70 96  Resp: 17 16 19 17   Temp: 98 F (36.7 C) 98.2 F (36.8 C) (!) 97.4 F (36.3 C) 98.1 F (36.7 C)  TempSrc: Oral Oral Oral Oral  SpO2: 100%  95% 90% 98%  Weight:   75.2 kg   Height:        Intake/Output Summary (Last 24 hours) at 10/05/2021 0731 Last data filed at 10/05/2021 0728 Gross per 24 hour  Intake 1730 ml  Output 1628 ml  Net 102 ml   Filed Weights   10/03/21 0338 10/04/21 0533 10/05/21 0443  Weight: 75.9 kg 74.7 kg 75.2 kg    Examination:  General exam: Appears calm and comfortable  Respiratory system: Clear to auscultation. Respiratory effort normal. Cardiovascular system: S1 & S2 heard, RRR. No JVD, murmurs, rubs, gallops or clicks. No pedal edema. Gastrointestinal system: Abdomen is nondistended, soft and nontender. No organomegaly or masses felt. Normal bowel sounds heard. Central nervous system: Alert and oriented. No focal neurological deficits. Extremities: Symmetric 5 x 5 power. Skin: No rashes, lesions or ulcers Psychiatry: Judgement and insight appear normal. Mood & affect appropriate.     Data Reviewed: I have personally reviewed following labs and imaging studies  CBC: Recent Labs  Lab 09/30/21 1915 10/01/21 0300 10/05/21 0032  WBC 11.2* 7.7 12.0*  NEUTROABS 8.6*  --   --   HGB 11.5* 11.0* 11.1*  HCT 35.8* 34.4* 34.2*  MCV 100.6* 102.1* 98.0  PLT 179 147* 166   Basic Metabolic Panel: Recent Labs  Lab 09/30/21 1915 09/30/21 2225 10/01/21 0300 10/04/21 0643 10/05/21 0032  NA 131*  --  135 132* 134*  K 2.9*  --  3.6 3.8 4.1  CL 97*  --  101 96* 98  CO2 29  --  27 24 28   GLUCOSE 98  --  111* 115* 115*  BUN 13  --  9 9 9   CREATININE 0.84  --  0.74 0.98 0.80  CALCIUM 8.3*  --  8.1* 9.1 8.3*  MG  --  1.5* 1.6* 1.8 1.7   GFR: Estimated Creatinine Clearance: 107.1 mL/min (by C-G formula based on SCr of 0.8 mg/dL). Liver Function Tests: Recent Labs  Lab 09/30/21 1915  AST 19  ALT 20  ALKPHOS 87  BILITOT 0.7  PROT 6.6  ALBUMIN 3.4*   No results for input(s): LIPASE, AMYLASE in the last 168 hours. No results for input(s): AMMONIA in the last 168 hours. Coagulation  Profile: Recent Labs  Lab 09/30/21 1915  INR 1.0   Cardiac Enzymes: No results for input(s): CKTOTAL, CKMB, CKMBINDEX, TROPONINI in the last 168 hours. BNP (last 3 results) No results for input(s): PROBNP in the last 8760 hours. HbA1C: No results for input(s): HGBA1C in the last 72 hours. CBG: No results for input(s): GLUCAP in the last 168 hours. Lipid Profile: No results for input(s): CHOL, HDL, LDLCALC, TRIG, CHOLHDL, LDLDIRECT in the last 72 hours. Thyroid Function Tests: No results for input(s): TSH, T4TOTAL, FREET4, T3FREE, THYROIDAB in the last 72 hours. Anemia Panel: No results for input(s): VITAMINB12, FOLATE, FERRITIN, TIBC, IRON, RETICCTPCT in the last 72 hours. Sepsis Labs: Recent Labs  Lab 09/30/21 1933 09/30/21 2230  LATICACIDVEN 0.7 0.6    Recent Results (from the past 240 hour(s))  Resp Panel by RT-PCR (Flu A&B, Covid) Nasopharyngeal Swab     Status: None   Collection Time: 09/30/21  7:15 PM   Specimen: Nasopharyngeal Swab; Nasopharyngeal(NP) swabs in vial transport medium  Result Value Ref Range Status  SARS Coronavirus 2 by RT PCR NEGATIVE NEGATIVE Final    Comment: (NOTE) SARS-CoV-2 target nucleic acids are NOT DETECTED.  The SARS-CoV-2 RNA is generally detectable in upper respiratory specimens during the acute phase of infection. The lowest concentration of SARS-CoV-2 viral copies this assay can detect is 138 copies/mL. A negative result does not preclude SARS-Cov-2 infection and should not be used as the sole basis for treatment or other patient management decisions. A negative result may occur with  improper specimen collection/handling, submission of specimen other than nasopharyngeal swab, presence of viral mutation(s) within the areas targeted by this assay, and inadequate number of viral copies(<138 copies/mL). A negative result must be combined with clinical observations, patient history, and epidemiological information. The expected result  is Negative.  Fact Sheet for Patients:  EntrepreneurPulse.com.au  Fact Sheet for Healthcare Providers:  IncredibleEmployment.be  This test is no t yet approved or cleared by the [place] FDA and  has been authorized for detection and/or diagnosis of SARS-CoV-2 by FDA under an Emergency Use Authorization (EUA). This EUA will remain  in effect (meaning this test can be used) for the duration of the COVID-19 declaration under Section 564(b)(1) of the Act, 21 U.S.C.section 360bbb-3(b)(1), unless the authorization is terminated  or revoked sooner.       Influenza A by PCR NEGATIVE NEGATIVE Final   Influenza B by PCR NEGATIVE NEGATIVE Final    Comment: (NOTE) The Xpert Xpress SARS-CoV-2/FLU/RSV plus assay is intended as an aid in the diagnosis of influenza from Nasopharyngeal swab specimens and should not be used as a sole basis for treatment. Nasal washings and aspirates are unacceptable for Xpert Xpress SARS-CoV-2/FLU/RSV testing.  Fact Sheet for Patients: EntrepreneurPulse.com.au  Fact Sheet for Healthcare Providers: IncredibleEmployment.be  This test is not yet approved or cleared by the [place] FDA and has been authorized for detection and/or diagnosis of SARS-CoV-2 by FDA under an Emergency Use Authorization (EUA). This EUA will remain in effect (meaning this test can be used) for the duration of the COVID-19 declaration under Section 564(b)(1) of the Act, 21 U.S.C. section 360bbb-3(b)(1), unless the authorization is terminated or revoked.  Performed at [facility], [place] [address] [place], [place] [address]   Culture, blood (Routine x 2)     Status: None (Preliminary result)   Collection Time: 09/30/21  7:45 PM   Specimen: BLOOD  Result Value Ref Range Status   Specimen Description   Final    BLOOD PORTA CATH Performed at [facility]  [address], [place], [place] [address]    Special Requests   Final    BOTTLES DRAWN AEROBIC AND ANAEROBIC Blood Culture adequate volume Performed at [facility] [address], [place], [place] [address]    Culture   Final    NO GROWTH 4 DAYS Performed at [facility], [place] [address], [place], [place] [address]    Report Status PENDING  Incomplete  Culture, blood (Routine x 2)     Status: Abnormal   Collection Time: 09/30/21 10:25 PM   Specimen: BLOOD  Result Value Ref Range Status   Specimen Description   Final    BLOOD BLOOD LEFT WRIST Performed at [facility] [address], [place], [place] [address]    Special Requests   Final    BOTTLES DRAWN AEROBIC ONLY Blood Culture results may not be optimal due to an inadequate volume of blood received in culture bottles Performed  at [facility], [place] [address], [place], [place] [address]    Culture  Setup Time   Final    GRAM POSITIVE COCCI IN CLUSTERS AEROBIC BOTTLE ONLY CRITICAL RESULT CALLED TO, READ BACK BY AND VERIFIED WITH: D,WOFFORD PHARMD @1451  10/02/21 EB    Culture (A)  Final    STAPHYLOCOCCUS EPIDERMIDIS THE SIGNIFICANCE OF ISOLATING THIS ORGANISM FROM A SINGLE SET OF BLOOD CULTURES WHEN MULTIPLE SETS ARE DRAWN IS UNCERTAIN. PLEASE NOTIFY THE MICROBIOLOGY DEPARTMENT WITHIN ONE WEEK IF SPECIATION AND SENSITIVITIES ARE REQUIRED. Performed at [facility], [place] [address] [place], [place] [address]    Report Status 10/04/2021 FINAL  Final  Blood Culture ID Panel (Reflexed)     Status: Abnormal   Collection Time: 09/30/21 10:25 PM  Result Value Ref Range Status   Enterococcus faecalis NOT DETECTED NOT DETECTED Final   Enterococcus Faecium NOT DETECTED NOT DETECTED Final   Listeria monocytogenes NOT DETECTED NOT DETECTED Final   Staphylococcus species DETECTED (A) NOT DETECTED Final    Comment: CRITICAL RESULT CALLED TO, READ BACK BY AND VERIFIED  WITH: D,WOFFORD PHARMD @1451  10/02/21 EB    Staphylococcus aureus (BCID) NOT DETECTED NOT DETECTED Final   Staphylococcus epidermidis DETECTED (A) NOT DETECTED Final    Comment: CRITICAL RESULT CALLED TO, READ BACK BY AND VERIFIED WITH: D,WOFFORD PHARMD @1451  10/02/21 EB    Staphylococcus lugdunensis NOT DETECTED NOT DETECTED Final   Streptococcus species NOT DETECTED NOT DETECTED Final   Streptococcus agalactiae NOT DETECTED NOT DETECTED Final   Streptococcus pneumoniae NOT DETECTED NOT DETECTED Final   Streptococcus pyogenes NOT DETECTED NOT DETECTED Final   A.calcoaceticus-baumannii NOT DETECTED NOT DETECTED Final   Bacteroides fragilis NOT DETECTED NOT DETECTED Final   Enterobacterales NOT DETECTED NOT DETECTED Final   Enterobacter cloacae complex NOT DETECTED NOT DETECTED Final   Escherichia coli NOT DETECTED NOT DETECTED Final   Klebsiella aerogenes NOT DETECTED NOT DETECTED Final   Klebsiella oxytoca NOT DETECTED NOT DETECTED Final   Klebsiella pneumoniae NOT DETECTED NOT DETECTED Final   Proteus species NOT DETECTED NOT DETECTED Final   Salmonella species NOT DETECTED NOT DETECTED Final   Serratia marcescens NOT DETECTED NOT DETECTED Final   Haemophilus influenzae NOT DETECTED NOT DETECTED Final   Neisseria meningitidis NOT DETECTED NOT DETECTED Final   Pseudomonas aeruginosa NOT DETECTED NOT DETECTED Final   Stenotrophomonas maltophilia NOT DETECTED NOT DETECTED Final   Candida albicans NOT DETECTED NOT DETECTED Final   Candida auris NOT DETECTED NOT DETECTED Final   Candida glabrata NOT DETECTED NOT DETECTED Final   Candida krusei NOT DETECTED NOT DETECTED Final   Candida parapsilosis NOT DETECTED NOT DETECTED Final   Candida tropicalis NOT DETECTED NOT DETECTED Final   Cryptococcus neoformans/gattii NOT DETECTED NOT DETECTED Final   Methicillin resistance mecA/C NOT DETECTED NOT DETECTED Final    Comment: Performed at [facility], [address] [place], [place]  [address]  MRSA Next Gen by PCR, Nasal     Status: None   Collection Time: 10/01/21  2:06 AM   Specimen: Nasal Mucosa; Nasal Swab  Result Value Ref Range Status   MRSA by PCR Next Gen NOT DETECTED NOT DETECTED Final    Comment: (NOTE) The GeneXpert MRSA Assay (FDA approved for NASAL specimens only), is one component of a comprehensive MRSA colonization surveillance program. It is not intended to diagnose MRSA infection nor to guide or monitor treatment for MRSA infections. Test performance is not FDA  approved in patients less than 43 years old. Performed at [facility], [place] [address] [place], [place] [address]   Surgical pcr screen     Status: None   Collection Time: 10/02/21 10:16 PM   Specimen: Nasal Mucosa; Nasal Swab  Result Value Ref Range Status   MRSA, PCR NEGATIVE NEGATIVE Final   Staphylococcus aureus NEGATIVE NEGATIVE Final    Comment: (NOTE) The Xpert SA Assay (FDA approved for NASAL specimens in patients 59 years of age and older), is one component of a comprehensive surveillance program. It is not intended to diagnose infection nor to guide or monitor treatment. Performed at [facility], [place] [address] [place], [place] [address]          Radiology Studies: DG CHEST PORT 1 VIEW  Result Date: 10/04/2021 CLINICAL DATA:  Reason for exam: Chest tube, central line, port placement Post operative EXAM: PORTABLE CHEST 1 VIEW COMPARISON:  03/24/2021.  Chest CT, 09/30/2021. FINDINGS: Cardiac silhouette normal in size. Stable left anterior chest wall AICD. No mediastinal or left hilar masses. Right hilum is retracted superiorly, mostly obscured by right upper lobe, apical opacity, stable from the prior chest radiograph. Remainder of the lungs is clear. Two chest tubes on the left, 1 curling over the left apex, the other extending over the cardiac silhouette, tip superimposed over the right heart border. No convincing pneumothorax.  No pleural effusion.  Small amount of subcutaneous air is seen along the lateral lower left hemithorax. Right internal jugular Port-A-Cath and left internal jugular dual lumen central venous catheters. Both are stable from the recent CT. IMPRESSION: 1. Two left-sided chest tubes as detailed. No convincing left pneumothorax. 2. Right upper lobe/apical opacity unchanged from the recent CT, consistent with chronic scarring. Remainder of the lungs is clear. Electronically Signed   By: Lajean Manes M.D.   On: 10/04/2021 10:57        Scheduled Meds:  amitriptyline  100 mg Oral QHS   aspirin EC  81 mg Oral Daily   atorvastatin  80 mg Oral Daily   Chlorhexidine Gluconate Cloth  6 each Topical Daily   citalopram  40 mg Oral Daily   fluticasone furoate-vilanterol  1 puff Inhalation Daily   And   umeclidinium bromide  1 puff Inhalation Daily   folic acid  1 mg Oral Daily   ketorolac  30 mg Intravenous Q6H   levothyroxine  125 mcg Oral Q0600   midodrine  10 mg Oral TID WC   nicotine  21 mg Transdermal Daily   sodium chloride flush  10-40 mL Intracatheter Q12H   sodium chloride flush  3 mL Intravenous Q12H   sucralfate  1 g Oral TID AC & HS   Continuous Infusions:   LOS: 4 days    Time spent: 35 minutes.     Elmarie Shiley, MD [facility]   If 7PM-7AM, please contact night-coverage [email or web address]  10/05/2021, 7:31 AM

## 2021-10-05 NOTE — Consult Note (Signed)
[facility] Nurse Consult Note: Patient receiving care in [facility] 2C11. Reason for Consult: "decubitus" Wound type: NO open wound to buttocks, sacrum, coccyx or scrotum.  The tissue is darker brown, however, consistent with Chronic Tissue Injury. The patient confirms he sits for long periods of time in a chair at home.  Pressure Injury POA: Yes/No/NA Measurement: Wound bed: Drainage (amount, consistency, odor)  Periwound: Dressing procedure/placement/frequency: No dressing needed at this time.  The patient and I discussed the importance of changing positions while in bed and in a chair to prevent wound formation. He verbalized his understanding.  Thank you for the consult.  Discussed plan of care with the patient and bedside nurse.  [facility] nurse will not follow at this time.  Please re-consult the [facility] team if needed.  Val Riles, RN, MSN, CWOCN, CNS-BC, pager [phone]

## 2021-10-05 NOTE — Care Management Important Message (Signed)
Important Message  Patient Details  Name: [name] MRN: [id number] Date of Birth: [birth year]   Medicare Important Message Given:  Yes     [name] 10/05/2021, 2:19 PM

## 2021-10-05 NOTE — Progress Notes (Addendum)
° °   °  [place] [address]       , [address]             [phone]       1 Day Post-Op Procedure(s) (LRB): PERICARDIAL WINDOW VIDEO ASSISTED THORACOSCOPY APPROACH (Left)  Subjective: Patient with pain "between ribs, especially when I cough".  Objective: Vital signs in last 24 hours: Temp:  [97 F (36.1 C)-98.2 F (36.8 C)] 97.4 F (36.3 C) (01/09 0443) Pulse Rate:  [70-93] 70 (01/09 0443) Cardiac Rhythm: Normal sinus rhythm (01/09 0054) Resp:  [10-21] 19 (01/09 0443) BP: (91-105)/(64-79) 92/67 (01/09 0007) SpO2:  [90 %-100 %] 90 % (01/09 0443) Arterial Line BP: (87-109)/(57-61) 87/57 (01/08 1003) Weight:  [75.2 kg] 75.2 kg (01/09 0443)     Intake/Output from previous day: 01/08 0701 - 01/09 0700 In: 1010 [I.V.:1000; IV Piggyback:10] Out: 1628 [Urine:1175; Blood:25; Chest Tube:228]   Physical Exam:  Cardiovascular: RRR Pulmonary: Inspiratory wheezing, coarse on left Abdomen: Soft, non tender, bowel sounds present. Extremities: SCD on LLE Wounds: Clean and dry.  No erythema or signs of infection. Chest Tubes:1 is to JP bulb and 1 is to water seal, no air leak  Lab Results: CBC: Recent Labs    10/05/21 0032  WBC 12.0*  HGB 11.1*  HCT 34.2*  PLT 188   BMET:  Recent Labs    10/04/21 0643 10/05/21 0032  NA 132* 134*  K 3.8 4.1  CL 96* 98  CO2 24 28  GLUCOSE 115* 115*  BUN 9 9  CREATININE 0.98 0.80  CALCIUM 9.1 8.3*    PT/INR: No results for input(s): LABPROT, INR in the last 72 hours. ABG:  INR: Will add last result for INR, ABG once components are confirmed Will add last 4 CBG results once components are confirmed  Assessment/Plan:  1. CV - SR with HR in the 90's. 2.  Pulmonary - History of COPD, NSCC lung cancer. Left chest tube with 228 cc since surgery. Left drain to bulb suction with minimal output in bulb this am (recorded as 0 last 12 hours). 1 Chest tube is to water seal and 1 is to JP bulb. CXR this am not ordered   Will order for am. Await pathology. Continue Breo Ellipta and Incruse Ellipta. Encourage incentive spirometer. 3. Anemia-H and H this am stable at 11.1 and 34.2 4. Regarding pain control, will give scheduled Toradol and Norco PRN pain (he took prior to surgery). 5. History of hypothyroidism, continue Levothyroxine 125 mcg daily 6. Management per primary  Donielle M ZimmermanPA-C 10/05/2021,7:06 AM [phone]   Continue CT drainage for now. Will start removing tubes once output <171mls  Marcia Hartwell O Jeancarlos Marchena

## 2021-10-05 NOTE — Plan of Care (Signed)

## 2021-10-06 ENCOUNTER — Inpatient Hospital Stay (HOSPITAL_COMMUNITY): Payer: Medicare Other

## 2021-10-06 DIAGNOSIS — I3131 Malignant pericardial effusion in diseases classified elsewhere: Secondary | ICD-10-CM | POA: Diagnosis not present

## 2021-10-06 LAB — CBC
HCT: 32.5 % — ABNORMAL LOW (ref 39.0–52.0)
Hemoglobin: 10.6 g/dL — ABNORMAL LOW (ref 13.0–17.0)
MCH: 32.5 pg (ref 26.0–34.0)
MCHC: 32.6 g/dL (ref 30.0–36.0)
MCV: 99.7 fL (ref 80.0–100.0)
Platelets: 164 10*3/uL (ref 150–400)
RBC: 3.26 MIL/uL — ABNORMAL LOW (ref 4.22–5.81)
RDW: 15 % (ref 11.5–15.5)
WBC: 8.6 10*3/uL (ref 4.0–10.5)
nRBC: 0 % (ref 0.0–0.2)

## 2021-10-06 LAB — BASIC METABOLIC PANEL
Anion gap: 10 (ref 5–15)
BUN: 9 mg/dL (ref 6–20)
CO2: 29 mmol/L (ref 22–32)
Calcium: 8.6 mg/dL — ABNORMAL LOW (ref 8.9–10.3)
Chloride: 92 mmol/L — ABNORMAL LOW (ref 98–111)
Creatinine, Ser: 0.92 mg/dL (ref 0.61–1.24)
GFR, Estimated: >60 mL/min (ref >60)
Glucose, Bld: 110 mg/dL — ABNORMAL HIGH (ref 70–99)
Potassium: 3.7 mmol/L (ref 3.5–5.1)
Sodium: 131 mmol/L — ABNORMAL LOW (ref 135–145)

## 2021-10-06 MED ORDER — LACTATED RINGERS IV BOLUS
500.0000 mL | Freq: Once | INTRAVENOUS | Status: AC
  Administered 2021-10-06: 500 mL via INTRAVENOUS

## 2021-10-06 MED ORDER — LACTATED RINGERS IV BOLUS: 500.0000 mL | Freq: Once | INTRAVENOUS | Status: AC

## 2021-10-06 MED ORDER — LEVOTHYROXINE SODIUM 75 MCG PO TABS
150.0000 ug | ORAL_TABLET | Freq: Every day | ORAL | Status: DC
  Administered 2021-10-07 – 2021-10-09 (×3): 150 ug via ORAL
  Filled 2021-10-06 (×3): qty 2

## 2021-10-06 NOTE — Plan of Care (Signed)

## 2021-10-06 NOTE — TOC Progression Note (Signed)
Transition of Care [facility]) - Progression Note    Patient Details  Name: [name] MRN: [id number] Date of Birth: June 17, 1961  [facility]) CM/SW [place], RN Phone Number:[phone]  10/06/2021, 3:20 PM  Clinical Narrative:    CM at bedside to discuss home DME per therapy recommendations. PT has recommended wheelchair. Patient has been made aware and is ok with CM ordering DME. Wheelchair has been ordered through [facility] and will be delivered to the bedside.        Expected Discharge Plan and Services                                                 Social Determinants of Health (SDOH) Interventions    Readmission Risk Interventions No flowsheet data found.

## 2021-10-06 NOTE — Progress Notes (Signed)
Progress Note  Patient Name: [name] Date of Encounter: 10/06/2021  Primary Cardiologist: [name], MD   Subjective   Patient was seen and examined at his bedside.  He was sitting up in a chair when I arrived.  Inpatient Medications    Scheduled Meds:  amitriptyline  100 mg Oral QHS   aspirin EC  81 mg Oral Daily   atorvastatin  80 mg Oral Daily   Chlorhexidine Gluconate Cloth  6 each Topical Daily   citalopram  40 mg Oral Daily   fluticasone furoate-vilanterol  1 puff Inhalation Daily   And   umeclidinium bromide  1 puff Inhalation Daily   folic acid  1 mg Oral Daily   ketorolac  30 mg Intravenous Q6H   levothyroxine  125 mcg Oral Q0600   midodrine  10 mg Oral TID WC   nicotine  21 mg Transdermal Daily   sodium chloride flush  10-40 mL Intracatheter Q12H   sodium chloride flush  3 mL Intravenous Q12H   sucralfate  1 g Oral TID AC & HS   Continuous Infusions:  PRN Meds: acetaminophen **OR** acetaminophen, albuterol, ALPRAZolam, HYDROcodone-acetaminophen, ondansetron **OR** ondansetron (ZOFRAN) IV, polyvinyl alcohol, senna-docusate, sodium chloride flush   Vital Signs    Vitals:   10/06/21 0400 10/06/21 0738 10/06/21 0807 10/06/21 0808  BP: 98/68 (!) 94/59 (!) 92/57   Pulse: 72 79 80   Resp: 15 15 11    Temp: (!) 97.4 F (36.3 C) 97.9 F (36.6 C)    TempSrc: Oral Oral    SpO2: 95% 99% 98% 95%  Weight:      Height:        Intake/Output Summary (Last 24 hours) at 10/06/2021 1121 Last data filed at 10/06/2021 0800 Gross per 24 hour  Intake 1200 ml  Output 1200 ml  Net 0 ml   Filed Weights   10/03/21 0338 10/04/21 0533 10/05/21 0443  Weight: 75.9 kg 74.7 kg 75.2 kg    Telemetry    Sinus rhythm- Personally Reviewed  ECG    None today- Personally Reviewed  Physical Exam     General: Comfortable, sitting up in a chair Head: Atraumatic, normal size  Eyes: PEERLA, EOMI  Neck: Supple, normal JVD Cardiac: Normal S1, S2; RRR; no murmurs,  rubs, or gallops Lungs: Clear to auscultation bilaterally Abd: Soft, nontender, no hepatomegaly  Ext: warm, no edema Musculoskeletal: No deformities, BUE and BLE strength normal and equal Skin: Warm and dry, no rashes   Neuro: Alert and oriented to person, place, time, and situation, CNII-XII grossly intact, no focal deficits  Psych: Normal mood and affect   Labs    Chemistry Recent Labs  Lab 09/30/21 1915 10/01/21 0300 10/04/21 0643 10/05/21 0032 10/06/21 0938  NA 131*   < > 132* 134* 131*  K 2.9*   < > 3.8 4.1 3.7  CL 97*   < > 96* 98 92*  CO2 29   < > 24 28 29   GLUCOSE 98   < > 115* 115* 110*  BUN 13   < > 9 9 9   CREATININE 0.84   < > 0.98 0.80 0.92  CALCIUM 8.3*   < > 9.1 8.3* 8.6*  PROT 6.6  --   --   --   --   ALBUMIN 3.4*  --   --   --   --   AST 19  --   --   --   --   ALT  20  --   --   --   --   ALKPHOS 87  --   --   --   --   BILITOT 0.7  --   --   --   --   GFRNONAA >60   < > >60 >60 >60  ANIONGAP 5   < > 12 8 10    < > = values in this interval not displayed.     Hematology Recent Labs  Lab 10/01/21 0300 10/05/21 0032 10/06/21 0938  WBC 7.7 12.0* 8.6  RBC 3.37* 3.49* 3.26*  HGB 11.0* 11.1* 10.6*  HCT 34.4* 34.2* 32.5*  MCV 102.1* 98.0 99.7  MCH 32.6 31.8 32.5  MCHC 32.0 32.5 32.6  RDW 15.7* 14.9 15.0  PLT 147* 188 164    Cardiac EnzymesNo results for input(s): TROPONINI in the last 168 hours. No results for input(s): TROPIPOC in the last 168 hours.   BNP Recent Labs  Lab 09/30/21 1915  BNP 108.2*     DDimer No results for input(s): DDIMER in the last 168 hours.   Radiology    DG CHEST PORT 1 VIEW  Result Date: 10/06/2021 CLINICAL DATA:  Chest tube present, malignant pericardial effusion EXAM: PORTABLE CHEST 1 VIEW COMPARISON:  10/04/2021 FINDINGS: Shallow inspiration with low lung volumes. Similar appearance of the right hemithorax with superior retraction of the hilum and right apical opacity. Lungs are otherwise clear. No pleural  effusion or pneumothorax identified. Stable cardiomediastinal contours. Chest tubes and right chest wall port catheter again identified. IMPRESSION: No substantial change from the prior study. Electronically Signed   By: Macy Mis M.D.   On: 10/06/2021 08:19    Cardiac Studies   TTE 10/01/2021 IMPRESSIONS   1. Poor acoustic windows Definity used. LVEF is depressed. There is akinesis of the mid/distal septum, distal anterior and apical walls. Overall LVEF is probably 30%.   2. Right ventricular systolic function is normal. The right ventricular size is normal.   3. Large pericardial effusion surrounds heart with consolidatation. Measures 17 mm in maximal dimension. More prominent than in echo done in June 2022     Effusion is most prominent at apex and posteriorly . There is mitral inflow variation. The IVC is dilated with blunted respiratory collapse      There is no definite RV collapse, RA collapse, however poor acoustic windows limit apical views. Concerning for hemodynamic compromise. Case reviewed with W Camnitz.   4. The mitral valve is grossly normal. No evidence of mitral valve regurgitation.   5. The aortic valve is normal in structure. Aortic valve regurgitation is not visualized.   FINDINGS   Left Ventricle: Poor acoustic windows Definity used. LVEF is depressed.  There is akinesis of the mid/distal septum, distal anterior and apical walls. Overall LVEF is probably 30%. Definity contrast agent was given IV to delineate the left ventricular endocardial borders. The left ventricular internal cavity size was normal  in size. There is no left ventricular hypertrophy.   Right Ventricle: The right ventricular size is normal. Right vetricular  wall thickness was not assessed. Right ventricular systolic function is  normal.   Left Atrium: Left atrial size was normal in size.   Right Atrium: Right atrial size was normal in size.   Pericardium: Large pericardial effusion surrounds heart  with consolidatation. Measures 17 mm in maximal dimension. More prominent than in echo done in June 2022 Effusion is most prominent at apex and posteriorly There is mitral inflow variation. The  IVC is dilated with blunted respiratory collapse  There is no definite RV collapse, RA collapse, however poor acoustic windows limit apical views. Concerning for hemodynamic compromise. Case reviewed with W Camnitz.   Mitral Valve: The mitral valve is grossly normal. No evidence of mitral  valve regurgitation.   Tricuspid Valve: The tricuspid valve is grossly normal. Tricuspid valve  regurgitation is not demonstrated.   Aortic Valve: The aortic valve is normal in structure. Aortic valve  regurgitation is not visualized.   Pulmonic Valve: The pulmonic valve was not well visualized.   Aorta: The aortic root is normal in size and structure.   IAS/Shunts: The interatrial septum was not assessed.   Additional Comments: A device lead is visualized.   Patient Profile     61 y.o. 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 Recommend limited echo prior to discharge.   Coronary artery disease-no anginal symptoms continue aspirin and statin.   Ischemic cardiomyopathy status post ICD-due to hypotension very hard to optimize his medical management.  Was on minimal lisinopril agree with not starting patient back on  his medication for now.  Hypotension-on midodrine   Lung cancer follows with oncology.   Tobacco use-on the nicotine patch tells me is helping.     For questions or updates, please contact [facility] Please consult [email or web address] for contact info under Cardiology/STEMI.      Signed, Ambriella Kitt, DO  10/06/2021, 11:21 AM

## 2021-10-06 NOTE — Evaluation (Signed)
Physical Therapy Evaluation Patient Details Name: [name] MRN: [id number] DOB: 1961-04-27 Today's Date: 10/06/2021  History of Present Illness  Pt is a 61 y.o. male admitted with pleuritic chest pain, productive cough, lightheadedness. Chest CTA shows large pericaredial effusion, no PE. S/p L VATS, pericaredial window, pericardial effusion evacuation on 1/8. PMH includes PAD s/p R BKA (2009), CAD, ischemic cardiomyopathy, HFrEF, AICD, COPD, HLD, Hep C, non-small cell lung CA.   Clinical Impression  Pt presents with an overall decrease in functional mobility secondary to above. PTA, pt mod indep ambulating with RLE prosthetic and cane, also intermittent use of w/c; lives with wife who assists as needed. Today, pt moving well with up to minA for standing activity. Educ re: activity recommendations, importance of OOB mobility. Pt would benefit from continued acute PT services to maximize functional mobility and independence prior to d/c home; pt declining need for follow-up PT services.     SpO2 >/92% on RA HR 81 BP 93/72    Recommendations for follow up therapy are one component of a multi-disciplinary discharge planning process, led by the attending physician.  Recommendations may be updated based on patient status, additional functional criteria and insurance authorization.  Follow Up Recommendations No PT follow up    Assistance Recommended at Discharge Intermittent Supervision/Assistance  Patient can return home with the following  A little help with walking and/or transfers;A little help with bathing/dressing/bathroom;Assist for transportation;Assistance with Forensic psychologist (measurements PT);Wheelchair cushion (measurements PT)  Recommendations for Other Services       Functional Status Assessment Patient has had a recent decline in their functional status and demonstrates the ability to make significant improvements in function in a  reasonable and predictable amount of time.     Precautions / Restrictions Precautions Precautions: Fall;Other (comment) Precaution Comments: chest tube, L-side JP drain; h/o R BKA (does not have prosthetic in room) Restrictions Weight Bearing Restrictions: No      Mobility  Bed Mobility Overal bed mobility: Modified Independent             General bed mobility comments: HOB elevated    Transfers Overall transfer level: Needs assistance Equipment used: None Transfers: Sit to/from Stand;Bed to chair/wheelchair/BSC Sit to Stand: Min assist     Squat pivot transfers: Min guard     General transfer comment: Initial squat pivot transfer towards L-side from bed to recliner with min guard for balance; additional stand from recliner to RW with minA for trunk elevation    Ambulation/Gait                  Stairs            Wheelchair Mobility    Modified Rankin (Stroke Patients Only)       Balance Overall balance assessment: Needs assistance Sitting-balance support: No upper extremity supported Sitting balance-Leahy Scale: Good     Standing balance support: During functional activity;Reliant on assistive device for balance Standing balance-Leahy Scale: Poor Standing balance comment: Required assist for posterior hygiene while standing                             Pertinent Vitals/Pain Pain Assessment: Faces Faces Pain Scale: Hurts a little bit Pain Location: generalized (backside from being in bed) Pain Descriptors / Indicators: Discomfort;Sore Pain Intervention(s): Monitored during session;Repositioned    Home Living Family/patient expects to be discharged to:: Private residence Living Arrangements: Spouse/significant other Available  Help at Discharge: Family;Available 24 hours/day Type of Home: Mobile home Home Access: Ramped entrance       Home Layout: One level Home Equipment: Wheelchair - Publishing copy (2  wheels);Cane - single point Additional Comments: Lives with wife who can assist as needed, "She's picked me up (from falling) before." Reports w/c broken    Prior Function Prior Level of Function : Independent/Modified Independent             Mobility Comments: Reports typically mod indep ambulating with RLE prosthetic donned, with or without DME (prefers use of cane "that works really good"); reports w/c without brakes, but sometimes prefers to use when RLE sore from prosthetic use. Still drives, but reports he needs to stop due to h/o seizures ADLs Comments: Reports mod indep for ADL tasks     Hand Dominance        Extremity/Trunk Assessment   Upper Extremity Assessment Upper Extremity Assessment: Overall WFL for tasks assessed    Lower Extremity Assessment Lower Extremity Assessment: Generalized weakness;RLE deficits/detail;LLE deficits/detail RLE Deficits / Details: h/o R BKA (2009) RLE Sensation: history of peripheral neuropathy LLE Sensation: history of peripheral neuropathy       Communication   Communication: No difficulties  Cognition Arousal/Alertness: Awake/alert Behavior During Therapy: WFL for tasks assessed/performed;Flat affect Overall Cognitive Status: Within Functional Limits for tasks assessed                                 General Comments: WFL for simple tasks, not formally assessed        General Comments General comments (skin integrity, edema, etc.): Reports need for new wheelchair since brakes do not work. Declines need for follow-up PT services; reports wife able to provide necessary assist if needed. Educ on importance of OOB activity with lung pathology/procedure    Exercises     Assessment/Plan    PT Assessment Patient needs continued PT services  PT Problem List Decreased strength;Decreased activity tolerance;Decreased balance;Decreased mobility;Cardiopulmonary status limiting activity       PT Treatment Interventions  DME instruction;Gait training;Functional mobility training;Therapeutic activities;Therapeutic exercise;Balance training;Patient/family education;Wheelchair mobility training    PT Goals (Current goals can be found in the Care Plan section)  Acute Rehab PT Goals Patient Stated Goal: return home PT Goal Formulation: With patient Time For Goal Achievement: 10/20/21 Potential to Achieve Goals: Good    Frequency Min 3X/week     Co-evaluation               AM-PAC PT "6 Clicks" Mobility  Outcome Measure Help needed turning from your back to your side while in a flat bed without using bedrails?: None Help needed moving from lying on your back to sitting on the side of a flat bed without using bedrails?: None Help needed moving to and from a bed to a chair (including a wheelchair)?: A Little Help needed standing up from a chair using your arms (e.g., wheelchair or bedside chair)?: A Little Help needed to walk in hospital room?: Total Help needed climbing 3-5 steps with a railing? : Total 6 Click Score: 16    End of Session   Activity Tolerance: Patient tolerated treatment well Patient left: in chair;with call bell/phone within reach;with chair alarm set Nurse Communication: Mobility status PT Visit Diagnosis: Other abnormalities of gait and mobility (R26.89)    Time: 0923-3007 PT Time Calculation (min) (ACUTE ONLY): 22 min   Charges:  PT Evaluation $PT Eval Moderate Complexity: 1 Mod     Mabeline Caras, PT, DPT Acute Rehabilitation Services  Pager [phone] Office [place] 10/06/2021, 9:40 AM

## 2021-10-06 NOTE — Progress Notes (Signed)
° °   °  [address]       [place] [address]             [phone]      2 Days Post-Op Procedure(s) (LRB): PERICARDIAL WINDOW VIDEO ASSISTED THORACOSCOPY APPROACH (Left)  Subjective:  No new complaints.  Pain is well controlled  Objective: Vital signs in last 24 hours: Temp:  [97.4 F (36.3 C)-98.1 F (36.7 C)] 97.4 F (36.3 C) (01/10 0400) Pulse Rate:  [72-89] 72 (01/10 0400) Cardiac Rhythm: Normal sinus rhythm (01/10 0444) Resp:  [12-19] 15 (01/10 0400) BP: (88-106)/(60-69) 98/68 (01/10 0400) SpO2:  [93 %-100 %] 95 % (01/10 0400)  Intake/Output from previous day: 01/09 0701 - 01/10 0700 In: 1680 [P.O.:1680] Out: 750 [Urine:600; Drains:30; Chest Tube:120]  General appearance: alert, cooperative, and no distress Heart: regular rate and rhythm Lungs: coarse on left, some inspiratory wheezes Abdomen: soft, non-tender; bowel sounds normal; no masses,  no organomegaly Wound: clean and dry  Lab Results: Recent Labs    10/05/21 0032  WBC 12.0*  HGB 11.1*  HCT 34.2*  PLT 188   BMET:  Recent Labs    10/04/21 0643 10/05/21 0032  NA 132* 134*  K 3.8 4.1  CL 96* 98  CO2 24 28  GLUCOSE 115* 115*  BUN 9 9  CREATININE 0.98 0.80  CALCIUM 9.1 8.3*    PT/INR: No results for input(s): LABPROT, INR in the last 72 hours. ABG    Component Value Date/Time   PHART 7.312 (L) 03/24/2021 1600   HCO3 27.5 03/24/2021 1600   TCO2 24 05/26/2016 0132   O2SAT 99.6 03/24/2021 1600   CBG (last 3)  No results for input(s): GLUCAP in the last 72 hours.  Assessment/Plan: S/P Procedure(s) (LRB): PERICARDIAL WINDOW VIDEO ASSISTED THORACOSCOPY APPROACH (Left)  CV- hemodynamically stable Pulm- NSCC Lung CA, CT is on water seal, 120 cc output, leave in place today, JP drain 30 cc, continue pulmonary inhalers Pathology remains pending Dispo- patient stable, CT and JP drain to remain in place today, pathology pending, care per primary   LOS: 5 days    Ellwood Handler, PA-C 10/06/2021

## 2021-10-06 NOTE — Progress Notes (Signed)
PROGRESS NOTE    [name]  OYD:[id number] DOB: 11-17-1960 DOA: 09/30/2021 PCP: Redmond School, MD   Brief Narrative: 61 year old with past medical history significant for CAD status post PCI, ischemic cardiomyopathy, heart failure reduced ejection fraction 25 to 30% by TTE 03/24/2021, status post AICD, history of PEA arrest 03/2021 thought to be secondary to hypotension, history of seizure, does not want to take AED, stage III non-small cell lung cancer on chemo, COPD, PAD status post right BKA, hypothyroidism, BPH who presented to the ED 1/4 for evaluation of pleuritic chest pain, found to have large pericardial effusion, transferred to [facility] for pericardial window.  Patient underwent left video-assisted thoracoscopy, pericardial window and evacuation of pericardial effusion by Dr. Kipp Brood on 10/04/2021.    Assessment & Plan:   Principal Problem:   Malignant pericardial effusion Active Problems:   PAD (peripheral artery disease) (HCC)   COPD with chronic bronchitis and emphysema (HCC)   Non-small cell carcinoma of right lung, stage 3 (HCC)   Chronic hypotension   Hypothyroidism   HFrEF (heart failure with reduced ejection fraction) (HCC)   S/P implantation of automatic cardioverter/defibrillator (AICD)   CAD (coronary artery disease)   Aortic atherosclerosis (HCC)  1-Pericardial Effusion: -Patient presented with pleuritic chest pain found to have pericardial effusion, concern for malignant effusion - S/P Left video-assisted thoracoscopy  pericardial window and evacuation of pericardial effusion by Dr. Kipp Brood on 10/04/2021. -Chest tube still in place.  Management per cardiothoracic surgery -Follow-up pathology, specimen for cytology unable to be located. Fluid from pleurovac will be sent for cytology.  -CVTS planning on removing chest tube once output less than 150.   Chronic systolic heart failure/ischemic cardiomyopathy/CAD DES to LAD Last EF 30% status post AICD  02/2018 -Cardiology following -Holding lisinopril due to soft BP. Has history of chronic hypotension on midodrine at home.   Chronic hypotension Continue midodrine  Prolonged QT: Replete magnesium. Monitor.   Hypokalemia/hypomagnesemia: Replete mg IV .  K 4.1  Stage III NSCL Lung Cancer -First diagnosed in 2019 Last chemo 12/6 with pemetrexed Followed by Dr. Earlie Server  COPD: On trilogy at home Continue with Memory Dance and Incruse. On  scheduled albuterol. Incentive spirometry   PAD status post right femoral-tibial bypass followed by BKA  Prior history of PEA arrest 03/2021 thought to be secondary to hypotension:   History of seizure; he does not want to take AED  Impaired memory, chronic History of poor short-term B 12 ordered for tomorrow.   BPH: Continue Flomax  Hypothyroidism: Continues with synthroid.  Needs repeat TSH.  Will increase synthroid to 150 mcg from 125.   Current smoker: Continue counseling.   Chronic tissue injury: Patient was advised to change frequent position     Estimated body mass index is 22.48 kg/m as calculated from the following:   Height as of this encounter: 6' (1.829 m).   Weight as of this encounter: 75.2 kg.   DVT prophylaxis: SCD Code Status: Full code Family Communication: care discussed with patient Disposition Plan:  Status is: Inpatient  Remains inpatient appropriate because: treatment for pericardial effusion, SP pericardial window 1/08        Consultants:  Cardiology CVTS, Dr Kipp Brood.   Procedures:   left video-assisted thoracic [place] P, pericardial window and evacuation of pericardial effusion by Dr. Kipp Brood on 10/04/2021.   Antimicrobials:  None  Subjective: He is sitting recliner. Feeling well, pain controlled.     Objective: Vitals:   10/06/21 0400 10/06/21 0738 10/06/21 0807 10/06/21  0808  BP: 98/68 (!) 94/59 (!) 92/57   Pulse: 72 79 80   Resp: 15 15 11    Temp: (!) 97.4 F (36.3 C) 97.9  F (36.6 C)    TempSrc: Oral Oral    SpO2: 95% 99% 98% 95%  Weight:      Height:        Intake/Output Summary (Last 24 hours) at 10/06/2021 1343 Last data filed at 10/06/2021 1200 Gross per 24 hour  Intake 240 ml  Output 540 ml  Net -300 ml    Filed Weights   10/03/21 0338 10/04/21 0533 10/05/21 0443  Weight: 75.9 kg 74.7 kg 75.2 kg    Examination:  General exam: NAD Respiratory system: CTA Cardiovascular system: S 1, S 2 RRR, chest tube in place left side Gastrointestinal system: BS present, soft, nt Central nervous system: Alert.  Extremities: No edema .     Data Reviewed: I have personally reviewed following labs and imaging studies  CBC: Recent Labs  Lab 09/30/21 1915 10/01/21 0300 10/05/21 0032 10/06/21 0938  WBC 11.2* 7.7 12.0* 8.6  NEUTROABS 8.6*  --   --   --   HGB 11.5* 11.0* 11.1* 10.6*  HCT 35.8* 34.4* 34.2* 32.5*  MCV 100.6* 102.1* 98.0 99.7  PLT 179 147* 188 119    Basic Metabolic Panel: Recent Labs  Lab 09/30/21 1915 09/30/21 2225 10/01/21 0300 10/04/21 0643 10/05/21 0032 10/06/21 0938  NA 131*  --  135 132* 134* 131*  K 2.9*  --  3.6 3.8 4.1 3.7  CL 97*  --  101 96* 98 92*  CO2 29  --  27 24 28 29   GLUCOSE 98  --  111* 115* 115* 110*  BUN 13  --  9 9 9 9   CREATININE 0.84  --  0.74 0.98 0.80 0.92  CALCIUM 8.3*  --  8.1* 9.1 8.3* 8.6*  MG  --  1.5* 1.6* 1.8 1.7  --     GFR: Estimated Creatinine Clearance: 93.1 mL/min (by C-G formula based on SCr of 0.92 mg/dL). Liver Function Tests: Recent Labs  Lab 09/30/21 1915  AST 19  ALT 20  ALKPHOS 87  BILITOT 0.7  PROT 6.6  ALBUMIN 3.4*    No results for input(s): LIPASE, AMYLASE in the last 168 hours. No results for input(s): AMMONIA in the last 168 hours. Coagulation Profile: Recent Labs  Lab 09/30/21 1915  INR 1.0    Cardiac Enzymes: No results for input(s): CKTOTAL, CKMB, CKMBINDEX, TROPONINI in the last 168 hours. BNP (last 3 results) No results for input(s): PROBNP  in the last 8760 hours. HbA1C: No results for input(s): HGBA1C in the last 72 hours. CBG: No results for input(s): GLUCAP in the last 168 hours. Lipid Profile: No results for input(s): CHOL, HDL, LDLCALC, TRIG, CHOLHDL, LDLDIRECT in the last 72 hours. Thyroid Function Tests: No results for input(s): TSH, T4TOTAL, FREET4, T3FREE, THYROIDAB in the last 72 hours. Anemia Panel: No results for input(s): VITAMINB12, FOLATE, FERRITIN, TIBC, IRON, RETICCTPCT in the last 72 hours. Sepsis Labs: Recent Labs  Lab 09/30/21 1933 09/30/21 2230  LATICACIDVEN 0.7 0.6     Recent Results (from the past 240 hour(s))  Resp Panel by RT-PCR (Flu A&B, Covid) Nasopharyngeal Swab     Status: None   Collection Time: 09/30/21  7:15 PM   Specimen: Nasopharyngeal Swab; Nasopharyngeal(NP) swabs in vial transport medium  Result Value Ref Range Status   SARS Coronavirus 2 by RT PCR NEGATIVE NEGATIVE Final  Comment: (NOTE) SARS-CoV-2 target nucleic acids are NOT DETECTED.  The SARS-CoV-2 RNA is generally detectable in upper respiratory specimens during the acute phase of infection. The lowest concentration of SARS-CoV-2 viral copies this assay can detect is 138 copies/mL. A negative result does not preclude SARS-Cov-2 infection and should not be used as the sole basis for treatment or other patient management decisions. A negative result may occur with  improper specimen collection/handling, submission of specimen other than nasopharyngeal swab, presence of viral mutation(s) within the areas targeted by this assay, and inadequate number of viral copies(<138 copies/mL). A negative result must be combined with clinical observations, patient history, and epidemiological information. The expected result is Negative.  Fact Sheet for Patients:  EntrepreneurPulse.com.au  Fact Sheet for Healthcare Providers:  IncredibleEmployment.be  This test is no t yet approved or  cleared by the [place] FDA and  has been authorized for detection and/or diagnosis of SARS-CoV-2 by FDA under an Emergency Use Authorization (EUA). This EUA will remain  in effect (meaning this test can be used) for the duration of the COVID-19 declaration under Section 564(b)(1) of the Act, 21 U.S.C.section 360bbb-3(b)(1), unless the authorization is terminated  or revoked sooner.       Influenza A by PCR NEGATIVE NEGATIVE Final   Influenza B by PCR NEGATIVE NEGATIVE Final    Comment: (NOTE) The Xpert Xpress SARS-CoV-2/FLU/RSV plus assay is intended as an aid in the diagnosis of influenza from Nasopharyngeal swab specimens and should not be used as a sole basis for treatment. Nasal washings and aspirates are unacceptable for Xpert Xpress SARS-CoV-2/FLU/RSV testing.  Fact Sheet for Patients: EntrepreneurPulse.com.au  Fact Sheet for Healthcare Providers: IncredibleEmployment.be  This test is not yet approved or cleared by the [place] FDA and has been authorized for detection and/or diagnosis of SARS-CoV-2 by FDA under an Emergency Use Authorization (EUA). This EUA will remain in effect (meaning this test can be used) for the duration of the COVID-19 declaration under Section 564(b)(1) of the Act, 21 U.S.C. section 360bbb-3(b)(1), unless the authorization is terminated or revoked.  Performed at [facility], [place] [address], [place], [place] [address]   Culture, blood (Routine x 2)     Status: None   Collection Time: 09/30/21  7:45 PM   Specimen: BLOOD  Result Value Ref Range Status   Specimen Description   Final    BLOOD PORTA CATH Performed at [place] [address], [place], [place] [address]    Special Requests   Final    BOTTLES DRAWN AEROBIC AND ANAEROBIC Blood Culture adequate volume Performed at [place] [address], [place], [place] [address]     Culture   Final    NO GROWTH 5 DAYS Performed at [facility], [place] [address], [place], [place] [address]    Report Status 10/05/2021 FINAL  Final  Culture, blood (Routine x 2)     Status: Abnormal   Collection Time: 09/30/21 10:25 PM   Specimen: BLOOD  Result Value Ref Range Status   Specimen Description   Final    BLOOD BLOOD LEFT WRIST Performed at [place] [address], [place], [place] [address]    Special Requests   Final    BOTTLES DRAWN AEROBIC ONLY Blood Culture results may not be optimal due to an inadequate volume of blood received in culture bottles Performed at [place] [address], [place], [place] [address]  Culture  Setup Time   Final    GRAM POSITIVE COCCI IN CLUSTERS AEROBIC BOTTLE ONLY CRITICAL RESULT CALLED TO, READ BACK BY AND VERIFIED WITH: D,WOFFORD PHARMD @1451  10/02/21 EB    Culture (A)  Final    STAPHYLOCOCCUS EPIDERMIDIS THE SIGNIFICANCE OF ISOLATING THIS ORGANISM FROM A SINGLE SET OF BLOOD CULTURES WHEN MULTIPLE SETS ARE DRAWN IS UNCERTAIN. PLEASE NOTIFY THE MICROBIOLOGY DEPARTMENT WITHIN ONE WEEK IF SPECIATION AND SENSITIVITIES ARE REQUIRED. Performed at [facility], [place] [address], [place], [place] [address]    Report Status 10/04/2021 FINAL  Final  Blood Culture ID Panel (Reflexed)     Status: Abnormal   Collection Time: 09/30/21 10:25 PM  Result Value Ref Range Status   Enterococcus faecalis NOT DETECTED NOT DETECTED Final   Enterococcus Faecium NOT DETECTED NOT DETECTED Final   Listeria monocytogenes NOT DETECTED NOT DETECTED Final   Staphylococcus species DETECTED (A) NOT DETECTED Final    Comment: CRITICAL RESULT CALLED TO, READ BACK BY AND VERIFIED WITH: D,WOFFORD PHARMD @1451  10/02/21 EB    Staphylococcus aureus (BCID) NOT DETECTED NOT DETECTED Final   Staphylococcus epidermidis DETECTED (A) NOT DETECTED Final    Comment: CRITICAL RESULT CALLED TO, READ BACK BY AND VERIFIED  WITH: D,WOFFORD PHARMD @1451  10/02/21 EB    Staphylococcus lugdunensis NOT DETECTED NOT DETECTED Final   Streptococcus species NOT DETECTED NOT DETECTED Final   Streptococcus agalactiae NOT DETECTED NOT DETECTED Final   Streptococcus pneumoniae NOT DETECTED NOT DETECTED Final   Streptococcus pyogenes NOT DETECTED NOT DETECTED Final   A.calcoaceticus-baumannii NOT DETECTED NOT DETECTED Final   Bacteroides fragilis NOT DETECTED NOT DETECTED Final   Enterobacterales NOT DETECTED NOT DETECTED Final   Enterobacter cloacae complex NOT DETECTED NOT DETECTED Final   Escherichia coli NOT DETECTED NOT DETECTED Final   Klebsiella aerogenes NOT DETECTED NOT DETECTED Final   Klebsiella oxytoca NOT DETECTED NOT DETECTED Final   Klebsiella pneumoniae NOT DETECTED NOT DETECTED Final   Proteus species NOT DETECTED NOT DETECTED Final   Salmonella species NOT DETECTED NOT DETECTED Final   Serratia marcescens NOT DETECTED NOT DETECTED Final   Haemophilus influenzae NOT DETECTED NOT DETECTED Final   Neisseria meningitidis NOT DETECTED NOT DETECTED Final   Pseudomonas aeruginosa NOT DETECTED NOT DETECTED Final   Stenotrophomonas maltophilia NOT DETECTED NOT DETECTED Final   Candida albicans NOT DETECTED NOT DETECTED Final   Candida auris NOT DETECTED NOT DETECTED Final   Candida glabrata NOT DETECTED NOT DETECTED Final   Candida krusei NOT DETECTED NOT DETECTED Final   Candida parapsilosis NOT DETECTED NOT DETECTED Final   Candida tropicalis NOT DETECTED NOT DETECTED Final   Cryptococcus neoformans/gattii NOT DETECTED NOT DETECTED Final   Methicillin resistance mecA/C NOT DETECTED NOT DETECTED Final    Comment: Performed at [facility], [address] [place], [place] [address]  MRSA Next Gen by PCR, Nasal     Status: None   Collection Time: 10/01/21  2:06 AM   Specimen: Nasal Mucosa; Nasal Swab  Result Value Ref Range Status   MRSA by PCR Next Gen NOT DETECTED NOT DETECTED Final    Comment:  (NOTE) The GeneXpert MRSA Assay (FDA approved for NASAL specimens only), is one component of a comprehensive MRSA colonization surveillance program. It is not intended to diagnose MRSA infection nor to guide or monitor treatment for MRSA infections. Test performance is not FDA approved in patients less than 68 years old. Performed at [facility], [address] [place], [place] [address]   Surgical pcr screen     Status: None   Collection Time: 10/02/21 10:16 PM   Specimen: Nasal Mucosa; Nasal Swab  Result Value Ref Range Status   MRSA, PCR NEGATIVE NEGATIVE Final   Staphylococcus aureus NEGATIVE NEGATIVE Final    Comment: (NOTE) The Xpert SA Assay (FDA approved for NASAL specimens in patients 97 years of age and older), is one component of a comprehensive surveillance program. It is not intended to diagnose infection nor to guide or monitor treatment. Performed at [name], [place] [address], [place], [place] [address]           Radiology Studies: DG CHEST PORT 1 VIEW  Result Date: 10/06/2021 CLINICAL DATA:  Chest tube present, malignant pericardial effusion EXAM: PORTABLE CHEST 1 VIEW COMPARISON:  10/04/2021 FINDINGS: Shallow inspiration with low lung volumes. Similar appearance of the right hemithorax with superior retraction of the hilum and right apical opacity. Lungs are otherwise clear. No pleural effusion or pneumothorax identified. Stable cardiomediastinal contours. Chest tubes and right chest wall port catheter again identified. IMPRESSION: No substantial change from the prior study. Electronically Signed   By: Macy Mis M.D.   On: 10/06/2021 08:19        Scheduled Meds:  amitriptyline  100 mg Oral QHS   aspirin EC  81 mg Oral Daily   atorvastatin  80 mg Oral Daily   Chlorhexidine Gluconate Cloth  6 each Topical Daily   citalopram  40 mg Oral Daily   fluticasone furoate-vilanterol  1 puff Inhalation Daily   And   umeclidinium  bromide  1 puff Inhalation Daily   folic acid  1 mg Oral Daily   ketorolac  30 mg Intravenous Q6H   levothyroxine  125 mcg Oral Q0600   midodrine  10 mg Oral TID WC   nicotine  21 mg Transdermal Daily   sodium chloride flush  10-40 mL Intracatheter Q12H   sodium chloride flush  3 mL Intravenous Q12H   sucralfate  1 g Oral TID AC & HS   Continuous Infusions:   LOS: 5 days    Time spent: 35 minutes.     Elmarie Shiley, MD [facility]   If 7PM-7AM, please contact night-coverage [email or web address]  10/06/2021, 1:43 PM

## 2021-10-06 NOTE — Progress Notes (Signed)
Patient suffers from weakness which impairs their ability to perform daily activities like bathing, dressing, and grooming in the home.  A cane, crutch, or walker will not resolve  issue with performing activities of daily living. A wheelchair will allow patient to safely perform daily activities. Patient is not able to propel themselves in the home using a standard weight wheelchair due to arm weakness, endurance, and general weakness. Patient can self propel in the lightweight wheelchair. Length of need 12 months . Accessories: elevating leg rests (ELRs), wheel locks, extensions and anti-tippers.

## 2021-10-06 NOTE — Progress Notes (Signed)
° °   °  [address]       [place] [address]             [phone]      Cytology contacted nursing about order for cytology on body fluid.  This was obtained when the patient underwent VATS procedure with creation of pericardial window on 1/8.  I confirmed with nurses from the case that a specimen was infact obtained and sent for cytology.[name] Unfortunately this can not be located.  I have instructed the nurses to remove some fluid from the patient's Pleurovac to send for cytology.   Ellwood Handler, PA-C

## 2021-10-07 ENCOUNTER — Encounter: Payer: Medicare Other | Admitting: Physician Assistant

## 2021-10-07 DIAGNOSIS — I3131 Malignant pericardial effusion in diseases classified elsewhere: Secondary | ICD-10-CM | POA: Diagnosis not present

## 2021-10-07 DIAGNOSIS — J449 Chronic obstructive pulmonary disease, unspecified: Secondary | ICD-10-CM

## 2021-10-07 DIAGNOSIS — I2510 Atherosclerotic heart disease of native coronary artery without angina pectoris: Secondary | ICD-10-CM

## 2021-10-07 DIAGNOSIS — Z9689 Presence of other specified functional implants: Secondary | ICD-10-CM

## 2021-10-07 LAB — CBC WITH DIFFERENTIAL/PLATELET
Abs Immature Granulocytes: 0.11 10*3/uL — ABNORMAL HIGH (ref 0.00–0.07)
Basophils Absolute: 0.1 10*3/uL (ref 0.0–0.1)
Basophils Relative: 1 %
Eosinophils Absolute: 0.2 10*3/uL (ref 0.0–0.5)
Eosinophils Relative: 3 %
HCT: 32.3 % — ABNORMAL LOW (ref 39.0–52.0)
Hemoglobin: 10.2 g/dL — ABNORMAL LOW (ref 13.0–17.0)
Immature Granulocytes: 2 %
Lymphocytes Relative: 11 %
Lymphs Abs: 0.8 10*3/uL (ref 0.7–4.0)
MCH: 31.6 pg (ref 26.0–34.0)
MCHC: 31.6 g/dL (ref 30.0–36.0)
MCV: 100 fL (ref 80.0–100.0)
Monocytes Absolute: 0.7 10*3/uL (ref 0.1–1.0)
Monocytes Relative: 9 %
Neutro Abs: 5.5 10*3/uL (ref 1.7–7.7)
Neutrophils Relative %: 74 %
Platelets: 169 10*3/uL (ref 150–400)
RBC: 3.23 MIL/uL — ABNORMAL LOW (ref 4.22–5.81)
RDW: 15 % (ref 11.5–15.5)
WBC: 7.4 10*3/uL (ref 4.0–10.5)
nRBC: 0 % (ref 0.0–0.2)

## 2021-10-07 LAB — BASIC METABOLIC PANEL
Anion gap: 6 (ref 5–15)
BUN: 7 mg/dL (ref 6–20)
CO2: 29 mmol/L (ref 22–32)
Calcium: 8.1 mg/dL — ABNORMAL LOW (ref 8.9–10.3)
Chloride: 99 mmol/L (ref 98–111)
Creatinine, Ser: 0.96 mg/dL (ref 0.61–1.24)
GFR, Estimated: >60 mL/min (ref >60)
Glucose, Bld: 92 mg/dL (ref 70–99)
Potassium: 3.6 mmol/L (ref 3.5–5.1)
Sodium: 134 mmol/L — ABNORMAL LOW (ref 135–145)

## 2021-10-07 LAB — VITAMIN B12: Vitamin B-12: 779 pg/mL (ref 180–914)

## 2021-10-07 LAB — MAGNESIUM: Magnesium: 1.8 mg/dL (ref 1.7–2.4)

## 2021-10-07 NOTE — Progress Notes (Signed)
Progress Note   Patient: [name] CVE:[id number] DOB: 02-22-1961 DOA: 09/30/2021     6 DOS: the patient was seen and examined on 10/07/2021   Brief hospital course: 61 year old man complicated PMH including systolic CHF, AICD, non-small cell lung cancer on chemotherapy currently, chronic hypotension, presented with pleuritic chest pain, imaging revealed large pericardial effusion on 2D echo Underwent left video-assisted thoracoscopy, pericardial window and evacuation of pericardial effusion by Dr. Kipp Brood on 1/8 -CTS following for the chest tube Cardiology following, recommended limited 2D echo at the time of discharge once chest tube is out   Assessment and Plan * Malignant pericardial effusion- (present on admission) -- 2D echo showed large pericardial effusion, status post left video-assisted thoracoscopy, pericardial window and evacuation of pericardial effusion by Dr. Kipp Brood on 10/04/2021  -Chest tube in place.  Fluid from Pleurovac sent for cytology again this morning by [facility].,  Follow studies -Per CT VS, output remains too high to remove tube.  HFrEF (heart failure with reduced ejection fraction) ([place]) -- Cardiology following, history of ischemic cardiomyopathy/CAD  DES to LAD.   -2D echo showed EF about 30%, normal right ventricular systolic function, large pericardial effusion -Difficult to optimize medical management, with hypotension and multiple medical comorbidities, recommended to hold off on lisinopril.  Non-small cell carcinoma of right lung, stage 3 (HCC)- (present on admission) -- First diagnosed in 2019, follows Dr. Julien Nordmann, undergoing chemotherapy, last chemo was 12/6  -Presumed pericardial effusion to be malignant, follow cytology results.  Aortic atherosclerosis (HCC) -- Continue statin  CAD (coronary artery disease) -- History of ischemic cardiomyopathy, status post ICD, CAD, PEA arrest in 03/2021 -cardiology following, continue aspirin, statin  -Continue  midodrine for hypotension -Per cardiology, will need limited echo prior to discharge once chest tube is out  S/P implantation of automatic cardioverter/defibrillator (AICD)- (present on admission) -- Per cardiology.  Currently no acute issues.  Hypothyroidism- (present on admission) -- TSH high at 15.4, looking back consistently high over multiple data points. --Clinical significance unclear, continue Synthroid at current dose of 155mcg daily - outpatient follow-up with PCP.    Chronic hypotension -- Asymptomatic, continue midodrine  COPD with chronic bronchitis and emphysema ([place])- (present on admission) -- Stable, no wheezing, continue bronchodilators as needed, nicotine patch   PAD (peripheral artery disease) ([place])- (present on admission) -- History of right BKA, continue aspirin, statin   Prolonged QT interval-resolved as of 10/02/2021 --resolved  Hypomagnesemia-resolved as of 10/02/2021 --replete   Subjective: Sitting up in the bed, complaining of pain at the chest tube site 10/10, sharp and constant.  Pleural fluid sent again today by [facility].  Objective Vital signs were reviewed and unremarkable. BP 95/67 (BP Location: Left Arm)    Pulse 86    Temp 98.8 F (37.1 C) (Oral)    Resp 20    Ht 6' (1.829 m)    Wt 76.5 kg    SpO2 96%    BMI 22.87 kg/m    Physical Exam General: Alert and oriented x 3, NAD Cardiovascular: S1 S2 clear, RRR. No pedal edema b/l Respiratory: CTAB, no wheezing, + chest tube with JP drain Gastrointestinal: Soft, nontender, nondistended, NBS Ext: no pedal edema bilaterally Neuro: no new deficits Skin: No rashes Psych: Normal affect and demeanor, alert and oriented x3     Data Reviewed:  My review of labs, imaging, notes and other tests shows no new significant findings.   Family Communication: Discussed with the patient in detail  Disposition: Status is: Inpatient  Remains inpatient appropriate because: Chest tube output still high, not ready  for discharge.  DVT prophylaxis: SCDs     Time spent: 35 minutes  Author: Estill Cotta, MD 10/07/2021 11:49 AM  For on call review [email or web address].

## 2021-10-07 NOTE — Progress Notes (Signed)
Physical Therapy Treatment Patient Details Name: [name] MRN: [id number] DOB: 1961-07-16 Today's Date: 10/07/2021   History of Present Illness Pt is a 61 y.o. male admitted with pleuritic chest pain, productive cough, lightheadedness. Chest CTA shows large pericaredial effusion, no PE. S/p L VATS, pericaredial window, pericardial effusion evacuation on 1/8. PMH includes PAD s/p R BKA (2009), CAD, ischemic cardiomyopathy, HFrEF, AICD, COPD, HLD, Hep C, non-small cell lung CA.   PT Comments    Pt progressing well with mobility. Today's session focused on therex for improving strength and activity tolerance. Noted some cognitive impairments, which pt attributes to "chemo brain." Pt pleasant and motivated to participate. Will continue to follow acutely.  BP 99/63 HR 90s SpO2 99% on RA    Recommendations for follow up therapy are one component of a multi-disciplinary discharge planning process, led by the attending physician.  Recommendations may be updated based on patient status, additional functional criteria and insurance authorization.  Follow Up Recommendations  No PT follow up     Assistance Recommended at Discharge PRN  Patient can return home with the following A little help with bathing/dressing/bathroom;Assistance with cooking/housework;Assist for transportation   Equipment Recommendations  Wheelchair (measurements PT);Wheelchair cushion (measurements PT)    Recommendations for Other Services       Precautions / Restrictions Precautions Precautions: Fall;Other (comment) Precaution Comments: chest tube, L-side JP drain; h/o R BKA (does not have prosthetic in room) Restrictions Weight Bearing Restrictions: No     Mobility  Bed Mobility Overal bed mobility: Modified Independent             General bed mobility comments: HOB elevated    Transfers Overall transfer level: Needs assistance Equipment used: None;Rolling walker (2 wheels) Transfers: Bed to  chair/wheelchair/BSC;Sit to/from Stand Sit to Stand: Supervision   Squat pivot transfers: Supervision       General transfer comment: Initial squat pivot transfer towards L-side from bed to recliner with supervision for safety/lines; additional repeated sit<>stands from recliner to RW with supervision, initial cues for hand placement    Ambulation/Gait                   Stairs             Wheelchair Mobility    Modified Rankin (Stroke Patients Only)       Balance Overall balance assessment: Needs assistance Sitting-balance support: No upper extremity supported Sitting balance-Leahy Scale: Good     Standing balance support: During functional activity;Reliant on assistive device for balance;Single extremity supported Standing balance-Leahy Scale: Poor Standing balance comment: Reliant on UE support to maintain SLS                            Cognition Arousal/Alertness: Awake/alert Behavior During Therapy: WFL for tasks assessed/performed;Flat affect Overall Cognitive Status: No family/caregiver present to determine baseline cognitive functioning                                 General Comments: WFL for majority of simple tasks; pt attributes his cognition to "chemo brain", noted decreased attention and memory; suspect near baseline cognition        Exercises Amputee Exercises Hip Flexion/Marching: AROM;Both;Seated Knee Flexion: AROM;Right;Seated Knee Extension: AROM;Both;Seated Straight Leg Raises: AROM;Both;Seated Chair Push Up: AROM;Both;Seated;10 reps (holding LLE in air) Other Exercises Other Exercises: 10x repeated sit<>stand with single UE support to push to  stand, cues to achieve full L knee extension    General Comments        Pertinent Vitals/Pain Pain Assessment: No/denies pain Pain Intervention(s): Monitored during session    Home Living                          Prior Function            PT  Goals (current goals can now be found in the care plan section) Progress towards PT goals: Progressing toward goals    Frequency    Min 3X/week      PT Plan Current plan remains appropriate    Co-evaluation              AM-PAC PT "6 Clicks" Mobility   Outcome Measure  Help needed turning from your back to your side while in a flat bed without using bedrails?: None Help needed moving from lying on your back to sitting on the side of a flat bed without using bedrails?: None Help needed moving to and from a bed to a chair (including a wheelchair)?: A Little Help needed standing up from a chair using your arms (e.g., wheelchair or bedside chair)?: A Little Help needed to walk in hospital room?: Total Help needed climbing 3-5 steps with a railing? : Total 6 Click Score: 16    End of Session   Activity Tolerance: Patient tolerated treatment well Patient left: in chair;with call bell/phone within reach;with chair alarm set Nurse Communication: Mobility status PT Visit Diagnosis: Other abnormalities of gait and mobility (R26.89)     Time: 1031-1050 PT Time Calculation (min) (ACUTE ONLY): 19 min  Charges:  $Therapeutic Exercise: 8-22 mins                     Mabeline Caras, PT, DPT Acute Rehabilitation Services  Pager [phone] Office [place] 10/07/2021, 11:13 AM

## 2021-10-07 NOTE — Progress Notes (Signed)
Progress Note  Patient Name: [name] Date of Encounter: 10/07/2021  Primary Cardiologist: [name], MD   Subjective   Patient was seen and examined at his bedside.  He was sitting up in a chair when I arrived.  Inpatient Medications    Scheduled Meds:  amitriptyline  100 mg Oral QHS   aspirin EC  81 mg Oral Daily   atorvastatin  80 mg Oral Daily   Chlorhexidine Gluconate Cloth  6 each Topical Daily   citalopram  40 mg Oral Daily   fluticasone furoate-vilanterol  1 puff Inhalation Daily   And   umeclidinium bromide  1 puff Inhalation Daily   folic acid  1 mg Oral Daily   ketorolac  30 mg Intravenous Q6H   levothyroxine  150 mcg Oral Q0600   midodrine  10 mg Oral TID WC   nicotine  21 mg Transdermal Daily   sodium chloride flush  10-40 mL Intracatheter Q12H   sodium chloride flush  3 mL Intravenous Q12H   sucralfate  1 g Oral TID AC & HS   Continuous Infusions:  PRN Meds: acetaminophen **OR** acetaminophen, albuterol, ALPRAZolam, HYDROcodone-acetaminophen, ondansetron **OR** ondansetron (ZOFRAN) IV, polyvinyl alcohol, senna-docusate, sodium chloride flush   Vital Signs    Vitals:   10/07/21 0700 10/07/21 0759 10/07/21 0800 10/07/21 0805  BP: 98/73   97/69  Pulse: 81  82 81  Resp: 18  19 15   Temp:    97.7 F (36.5 C)  TempSrc:  Oral  Oral  SpO2: 96%  95% 99%  Weight:      Height:        Intake/Output Summary (Last 24 hours) at 10/07/2021 1058 Last data filed at 10/07/2021 0731 Gross per 24 hour  Intake --  Output 1965 ml  Net -1965 ml   Filed Weights   10/04/21 0533 10/05/21 0443 10/07/21 0649  Weight: 74.7 kg 75.2 kg 76.5 kg    Telemetry    Sinus rhythm- Personally Reviewed  ECG    None today- Personally Reviewed  Physical Exam     General: Comfortable, sitting up in a chair Head: Atraumatic, normal size  Eyes: PEERLA, EOMI  Neck: Supple, normal JVD Cardiac: Normal S1, S2; RRR; no murmurs, rubs, or gallops Lungs: Clear to  auscultation bilaterally Abd: Soft, nontender, no hepatomegaly  Ext: warm, no edema Musculoskeletal: No deformities, BUE and BLE strength normal and equal Skin: Warm and dry, no rashes   Neuro: Alert and oriented to person, place, time, and situation, CNII-XII grossly intact, no focal deficits  Psych: Normal mood and affect   Labs    Chemistry Recent Labs  Lab 09/30/21 1915 10/01/21 0300 10/05/21 0032 10/06/21 0938 10/07/21 0355  NA 131*   < > 134* 131* 134*  K 2.9*   < > 4.1 3.7 3.6  CL 97*   < > 98 92* 99  CO2 29   < > 28 29 29   GLUCOSE 98   < > 115* 110* 92  BUN 13   < > 9 9 7   CREATININE 0.84   < > 0.80 0.92 0.96  CALCIUM 8.3*   < > 8.3* 8.6* 8.1*  PROT 6.6  --   --   --   --   ALBUMIN 3.4*  --   --   --   --   AST 19  --   --   --   --   ALT 20  --   --   --   --  ALKPHOS 87  --   --   --   --   BILITOT 0.7  --   --   --   --   GFRNONAA >60   < > >60 >60 >60  ANIONGAP 5   < > 8 10 6    < > = values in this interval not displayed.     Hematology Recent Labs  Lab 10/05/21 0032 10/06/21 0938 10/07/21 0355  WBC 12.0* 8.6 7.4  RBC 3.49* 3.26* 3.23*  HGB 11.1* 10.6* 10.2*  HCT 34.2* 32.5* 32.3*  MCV 98.0 99.7 100.0  MCH 31.8 32.5 31.6  MCHC 32.5 32.6 31.6  RDW 14.9 15.0 15.0  PLT 188 164 169    Cardiac EnzymesNo results for input(s): TROPONINI in the last 168 hours. No results for input(s): TROPIPOC in the last 168 hours.   BNP Recent Labs  Lab 09/30/21 1915  BNP 108.2*     DDimer No results for input(s): DDIMER in the last 168 hours.   Radiology    DG CHEST PORT 1 VIEW  Result Date: 10/06/2021 CLINICAL DATA:  Chest tube present, malignant pericardial effusion EXAM: PORTABLE CHEST 1 VIEW COMPARISON:  10/04/2021 FINDINGS: Shallow inspiration with low lung volumes. Similar appearance of the right hemithorax with superior retraction of the hilum and right apical opacity. Lungs are otherwise clear. No pleural effusion or pneumothorax identified. Stable  cardiomediastinal contours. Chest tubes and right chest wall port catheter again identified. IMPRESSION: No substantial change from the prior study. Electronically Signed   By: Macy Mis M.D.   On: 10/06/2021 08:19    Cardiac Studies   TTE 10/01/2021 IMPRESSIONS   1. Poor acoustic windows Definity used. LVEF is depressed. There is akinesis of the mid/distal septum, distal anterior and apical walls. Overall LVEF is probably 30%.   2. Right ventricular systolic function is normal. The right ventricular size is normal.   3. Large pericardial effusion surrounds heart with consolidatation. Measures 17 mm in maximal dimension. More prominent than in echo done in June 2022     Effusion is most prominent at apex and posteriorly . There is mitral inflow variation. The IVC is dilated with blunted respiratory collapse      There is no definite RV collapse, RA collapse, however poor acoustic windows limit apical views. Concerning for hemodynamic compromise. Case reviewed with W Camnitz.   4. The mitral valve is grossly normal. No evidence of mitral valve regurgitation.   5. The aortic valve is normal in structure. Aortic valve regurgitation is not visualized.   FINDINGS   Left Ventricle: Poor acoustic windows Definity used. LVEF is depressed.  There is akinesis of the mid/distal septum, distal anterior and apical walls. Overall LVEF is probably 30%. Definity contrast agent was given IV to delineate the left ventricular endocardial borders. The left ventricular internal cavity size was normal  in size. There is no left ventricular hypertrophy.   Right Ventricle: The right ventricular size is normal. Right vetricular  wall thickness was not assessed. Right ventricular systolic function is  normal.   Left Atrium: Left atrial size was normal in size.   Right Atrium: Right atrial size was normal in size.   Pericardium: Large pericardial effusion surrounds heart with consolidatation. Measures 17 mm in  maximal dimension. More prominent than in echo done in June 2022 Effusion is most prominent at apex and posteriorly There is mitral inflow variation. The IVC is dilated with blunted respiratory collapse  There is no definite RV collapse,  RA collapse, however poor acoustic windows limit apical views. Concerning for hemodynamic compromise. Case reviewed with W Camnitz.   Mitral Valve: The mitral valve is grossly normal. No evidence of mitral  valve regurgitation.   Tricuspid Valve: The tricuspid valve is grossly normal. Tricuspid valve  regurgitation is not demonstrated.   Aortic Valve: The aortic valve is normal in structure. Aortic valve  regurgitation is not visualized.   Pulmonic Valve: The pulmonic valve was not well visualized.   Aorta: The aortic root is normal in size and structure.   IAS/Shunts: The interatrial septum was not assessed.   Additional Comments: A device lead is visualized.   Patient Profile     61 y.o. 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 Recommend limited echo prior to discharge.   Coronary artery disease-no anginal symptoms continue aspirin and statin.   Ischemic cardiomyopathy status post ICD-due to hypotension very hard to optimize his medical management.  Was on minimal lisinopril agree with not starting patient back on his medication for now.  Hypotension-on  midodrine   Lung cancer follows with oncology.   Tobacco use-on the nicotine patch tells me is helping.  Will sign off at this time.  He will need limited echo prior to discharge once chest tube is out. Continue with midodrine.     For questions or updates, please contact [facility] Please consult [email or web address] for contact info under Cardiology/STEMI.      Signed, Berniece Salines, DO  10/07/2021, 10:58 AM

## 2021-10-07 NOTE — Progress Notes (Addendum)
° °   °  [address]       [place] [address]             [phone]      3 Days Post-Op Procedure(s) (LRB): PERICARDIAL WINDOW VIDEO ASSISTED THORACOSCOPY APPROACH (Left)  Subjective:  No new complaints.  States the nurse changed his chest tube dressing.    Objective: Vital signs in last 24 hours: Temp:  [98.2 F (36.8 C)-98.6 F (37 C)] 98.2 F (36.8 C) (01/11 0300) Pulse Rate:  [76-90] 76 (01/11 0300) Cardiac Rhythm: Normal sinus rhythm (01/10 1900) Resp:  [11-20] 17 (01/11 0300) BP: (84-100)/(50-71) 100/66 (01/11 0300) SpO2:  [95 %-98 %] 97 % (01/11 0300) Weight:  [76.5 kg] 76.5 kg (01/11 0649)  Intake/Output from previous day: 01/10 0701 - 01/11 0700 In: 240 [P.O.:240] Out: 2415 [Urine:2250; Drains:30; Chest Tube:135]  General appearance: alert, cooperative, and no distress Heart: regular rate and rhythm Lungs: coarse on left Abdomen: soft, non-tender; bowel sounds normal; no masses,  no organomegaly Extremities: extremities normal, atraumatic, no cyanosis or edema Wound: clean and dry  Lab Results: Recent Labs    10/06/21 0938 10/07/21 0355  WBC 8.6 7.4  HGB 10.6* 10.2*  HCT 32.5* 32.3*  PLT 164 169   BMET:  Recent Labs    10/06/21 0938 10/07/21 0355  NA 131* 134*  K 3.7 3.6  CL 92* 99  CO2 29 29  GLUCOSE 110* 92  BUN 9 7  CREATININE 0.92 0.96  CALCIUM 8.6* 8.1*    PT/INR: No results for input(s): LABPROT, INR in the last 72 hours. ABG    Component Value Date/Time   PHART 7.312 (L) 03/24/2021 1600   HCO3 27.5 03/24/2021 1600   TCO2 24 05/26/2016 0132   O2SAT 99.6 03/24/2021 1600   CBG (last 3)  No results for input(s): GLUCAP in the last 72 hours.  Assessment/Plan: S/P Procedure(s) (LRB): PERICARDIAL WINDOW VIDEO ASSISTED THORACOSCOPY APPROACH (Left)  CV- hemodynamically stable Pulm- NSCC Lung CA, CT is on water seal- 135 cc output yesterday, JP drain with 30 cc output... I have collected fluid and sent for  cytology as OR specimen has not been located Pathology, Cytology remains pending DIspo- patient stable, CT output remains too high to remove, as discussed with Dr. Kipp Brood will remove JP drain today, care per primary   LOS: 6 days   Ellwood Handler, PA-C 10/07/2021  Agree with above Will remove JP drain today  Marianela Mandrell O Pakou Rainbow

## 2021-10-07 NOTE — Plan of Care (Signed)
°  Problem: Clinical Measurements: Goal: Ability to maintain clinical measurements within normal limits will improve Outcome: Progressing Goal: Will remain free from infection Outcome: Progressing Goal: Respiratory complications will improve Outcome: Progressing Goal: Cardiovascular complication will be avoided Outcome: Progressing   Problem: Activity: Goal: Risk for activity intolerance will decrease Outcome: Progressing   Problem: Nutrition: Goal: Adequate nutrition will be maintained Outcome: Progressing   Problem: Coping: Goal: Level of anxiety will decrease Outcome: Progressing   Problem: Elimination: Goal: Will not experience complications related to bowel motility Outcome: Progressing Goal: Will not experience complications related to urinary retention Outcome: Progressing

## 2021-10-07 NOTE — Plan of Care (Signed)

## 2021-10-07 NOTE — Progress Notes (Signed)
Removed JP drain, patient tolerated well, no bleeding or complications. Bandaged with guaze and vaseline gauze.

## 2021-10-07 NOTE — Plan of Care (Signed)

## 2021-10-08 ENCOUNTER — Inpatient Hospital Stay (HOSPITAL_COMMUNITY): Payer: Medicare Other

## 2021-10-08 DIAGNOSIS — I3139 Other pericardial effusion (noninflammatory): Secondary | ICD-10-CM

## 2021-10-08 LAB — ECHOCARDIOGRAM LIMITED
Height: 72 in
Weight: 2698.43 oz

## 2021-10-08 MED ORDER — GERHARDT'S BUTT CREAM
TOPICAL_CREAM | Freq: Every day | CUTANEOUS | Status: DC
  Filled 2021-10-08: qty 1

## 2021-10-08 NOTE — Plan of Care (Signed)
°  Problem: Education: Goal: Knowledge of General Education information will improve Description: Including pain rating scale, medication(s)/side effects and non-pharmacologic comfort measures Outcome: Progressing   Problem: Clinical Measurements: Goal: Ability to maintain clinical measurements within normal limits will improve Outcome: Progressing Goal: Will remain free from infection Outcome: Progressing   Problem: Coping: Goal: Level of anxiety will decrease Outcome: Progressing

## 2021-10-08 NOTE — Progress Notes (Signed)
°  Echocardiogram 2D Echocardiogram has been performed.  [name] 10/08/2021, 10:34 AM

## 2021-10-08 NOTE — Progress Notes (Addendum)
° °   °  [address]       [place] [address]             [phone]      4 Days Post-Op Procedure(s) (LRB): PERICARDIAL WINDOW VIDEO ASSISTED THORACOSCOPY APPROACH (Left)  Subjective:  Patient complains of soreness.  Otherwise is w/o complaints.  Objective: Vital signs in last 24 hours: Temp:  [97.5 F (36.4 C)-98.8 F (37.1 C)] 97.6 F (36.4 C) (01/12 0700) Pulse Rate:  [73-86] 78 (01/12 0700) Cardiac Rhythm: Normal sinus rhythm (01/11 1903) Resp:  [14-20] 17 (01/12 0700) BP: (85-113)/(58-73) 85/58 (01/12 0700) SpO2:  [95 %-100 %] 95 % (01/12 0700)  Intake/Output from previous day: 01/11 0701 - 01/12 0700 In: -  Out: 1180 [Urine:1150; Chest Tube:30]  General appearance: alert, cooperative, and no distress Heart: regular rate and rhythm Lungs: clear to auscultation bilaterally Abdomen: soft, non-tender; bowel sounds normal; no masses,  no organomegaly Extremities: extremities normal, atraumatic, no cyanosis or edema Wound: clean and dry  Lab Results: Recent Labs    10/06/21 0938 10/07/21 0355  WBC 8.6 7.4  HGB 10.6* 10.2*  HCT 32.5* 32.3*  PLT 164 169   BMET:  Recent Labs    10/06/21 0938 10/07/21 0355  NA 131* 134*  K 3.7 3.6  CL 92* 99  CO2 29 29  GLUCOSE 110* 92  BUN 9 7  CREATININE 0.92 0.96  CALCIUM 8.6* 8.1*    PT/INR: No results for input(s): LABPROT, INR in the last 72 hours. ABG    Component Value Date/Time   PHART 7.312 (L) 03/24/2021 1600   HCO3 27.5 03/24/2021 1600   TCO2 24 05/26/2016 0132   O2SAT 99.6 03/24/2021 1600   CBG (last 3)  No results for input(s): GLUCAP in the last 72 hours.  Assessment/Plan: S/P Procedure(s) (LRB): PERICARDIAL WINDOW VIDEO ASSISTED THORACOSCOPY APPROACH (Left)  CV- hemodynamically stable Pulm- CT output is low about 30 cc, will remove chest tube today.. continue IS Pathology, Cytology- not reported, will check with lab today to see if they have run specimen Dispo- patient  stable, will d/c chest tube today, repeat CXR in AM if stable per medicine can likely be discharged tomorrow   LOS: 7 days    Ellwood Handler, PA-C 10/08/2021  Agree with above Will remove final CT today  Lavada Langsam O Micaylah Bertucci

## 2021-10-08 NOTE — Progress Notes (Signed)
Progress Note   Patient: [name] [id number] DOB: 13-Jan-1961 DOA: 09/30/2021     7 DOS: the patient was seen and examined on 10/08/2021   Brief hospital course: 61 year old man complicated PMH including systolic CHF, AICD, non-small cell lung cancer on chemotherapy currently, chronic hypotension, presented with pleuritic chest pain, imaging revealed large pericardial effusion on 2D echo Underwent left video-assisted thoracoscopy, pericardial window and evacuation of pericardial effusion by Dr. Kipp Brood on 1/8 -CTS following.  Drain removed on 1/11.  Chest tube to be removed today - will order 2 view chest x-ray for a.m. -Hopefully DC home in a.m.  Assessment and Plan * Malignant pericardial effusion- (present on admission) -- 2D echo showed large pericardial effusion, status post left video-assisted thoracoscopy, pericardial window and evacuation of pericardial effusion by Dr. Kipp Brood on 10/04/2021  -Chest tube in place.  Fluid from Pleurovac sent for cytology again on 1/11, results pending -Drain removed on 1/11, chest tube to be removed today per CT VS -Will repeat chest x-ray in a.m., possible DC home tomorrow.  Will have patient's oncologist, Dr. Julien Nordmann follow-up on cytology results, and discussed with the patient in detail.  HFrEF (heart failure with reduced ejection fraction) ([facility]) -- Cardiology following, history of ischemic cardiomyopathy/CAD  DES to LAD.   -2D echo showed EF about 30%, normal right ventricular systolic function, large pericardial effusion -Difficult to optimize medical management, with hypotension and multiple medical comorbidities, recommended to hold off on lisinopril. -Limited echo done today, results still pending  Non-small cell carcinoma of right lung, stage 3 ([facility])- (present on admission) -- First diagnosed in 2019, follows Dr. Julien Nordmann, undergoing chemotherapy, last chemo was 12/6  -Presumed pericardial effusion to be malignant, follow cytology  results, still pending.  Aortic atherosclerosis ([facility]) -- Continue statin  CAD (coronary artery disease) -- History of ischemic cardiomyopathy, status post ICD, CAD, PEA arrest in 03/2021 -cardiology following, continue aspirin, statin  -Continue midodrine for hypotension -Limited Echo completed today- results pending  S/P implantation of automatic cardioverter/defibrillator (AICD)- (present on admission) -- Per cardiology.  Currently no acute issues, stable.  Hypothyroidism- (present on admission) -- TSH high at 15.4, looking back consistently high over multiple data points. --Clinical significance unclear, continue Synthroid at current dose of 121mcg daily - outpatient follow-up with PCP, repeat thyroid panel in 4 to 6 weeks.    Chronic hypotension -- Asymptomatic, continue midodrine  COPD with chronic bronchitis and emphysema ([facility])- (present on admission) -- Stable, no wheezing, continue bronchodilators as needed, nicotine patch   PAD (peripheral artery disease) ([facility])- (present on admission) -- History of right BKA, continue aspirin, statin.  No acute issues  Prolonged QT interval-resolved as of 10/02/2021 --resolved  Hypomagnesemia-resolved as of 10/02/2021 --replete   Subjective: Still has soreness at the chest tube site otherwise feeling better today.  Objective BP 112/66 (BP Location: Left Arm)    Pulse 87    Temp 98.4 F (36.9 C) (Oral)    Resp 19    Ht 6' (1.829 m)    Wt 76.5 kg    SpO2 98%    BMI 22.87 kg/m    Physical Exam General: Alert and oriented x 3, NAD Cardiovascular: S1 S2 clear, RRR. No pedal edema b/l Respiratory: Diminished breath sounds, chest tube + Gastrointestinal: Soft, nontender, nondistended, NBS Ext: no pedal edema bilaterally Neuro: no new deficits Psych: Normal affect and demeanor, alert and oriented x3   Data Reviewed:  My review of labs, imaging, notes and other tests shows no  new significant findings.   Family Communication: Discussed  with the patient  Disposition: Status is: Inpatient  Remains inpatient appropriate because: Chest tube to be removed today, possible DC home in a.m.   Time spent: 35 minutes  Author: Estill Cotta, MD 10/08/2021 1:47 PM  For on call review [email or web address].

## 2021-10-08 NOTE — Consult Note (Signed)
° °  [facility] Inpatient Consult   10/08/2021  [name] 02/08/1961 [id number]  [place] [facility] [ACO] Patient: [employer]  Primary Care Provider:  Redmond School, MD, [facility], is an Embedded provider noted and patient   Patient screened for length of stay hospitalization with noted high risk score for unplanned readmission risk and  to assess for potential [facility] service needs for post hospital transition.  Review of patient's medical record reveals patient is currently for home and no needs identified.    Plan:  Continue to follow progress and disposition to assess for post hospital care management needs, if any needs occur.    For questions contact:   Natividad Brood, RN BSN [facility] Liaison  [phone] business mobile phone Toll free office [phone]  Fax number: [phone] [email or web address]     .

## 2021-10-09 ENCOUNTER — Telehealth: Payer: Self-pay | Admitting: Internal Medicine

## 2021-10-09 ENCOUNTER — Encounter: Payer: Self-pay | Admitting: Medical Oncology

## 2021-10-09 ENCOUNTER — Inpatient Hospital Stay (HOSPITAL_COMMUNITY): Payer: Medicare Other

## 2021-10-09 LAB — SURGICAL PATHOLOGY

## 2021-10-09 MED ORDER — HEPARIN SOD (PORK) LOCK FLUSH 100 UNIT/ML IV SOLN
500.0000 [IU] | INTRAVENOUS | Status: AC | PRN
  Administered 2021-10-09: 500 [IU]
  Filled 2021-10-09: qty 5

## 2021-10-09 MED ORDER — HYDROCODONE-ACETAMINOPHEN 10-325 MG PO TABS: 1.0000 | ORAL_TABLET | ORAL | 0 refills | Status: DC | PRN

## 2021-10-09 NOTE — Progress Notes (Signed)
Physical Therapy Treatment Patient Details Name: [name] MRN: [id number] DOB: September 11, 1961 Today's Date: 10/09/2021   History of Present Illness Pt is a 61 y.o. male admitted with pleuritic chest pain, productive cough, lightheadedness. Chest CTA shows large pericaredial effusion, no PE. S/p L VATS, pericaredial window, pericardial effusion evacuation on 1/8. PMH includes PAD s/p R BKA (2009), CAD, ischemic cardiomyopathy, HFrEF, AICD, COPD, HLD, Hep C, non-small cell lung CA.   PT Comments    Pt progressing with mobility. Today's session focused on education in preparation for d/c home today, including wheelchair management (new chair delivered to pt's room). Pt reports indep performing HEP provided last session. Reviewed educ re: fall risk reduction, therex, activity recommendations. Pt reports no further questions or concerns. Will d/c acute PT.     Recommendations for follow up therapy are one component of a multi-disciplinary discharge planning process, led by the attending physician.  Recommendations may be updated based on patient status, additional functional criteria and insurance authorization.  Follow Up Recommendations  No PT follow up     Assistance Recommended at Discharge PRN  Patient can return home with the following A little help with bathing/dressing/bathroom;Assistance with cooking/housework;Assist for transportation   Equipment Recommendations  Wheelchair (measurements PT);Wheelchair cushion (measurements PT) - already delivered   Recommendations for Other Services       Precautions / Restrictions Precautions Precautions: Fall;Other (comment) Precaution Comments: h/o R BKA (does not have prosthetic in room) Restrictions Weight Bearing Restrictions: No     Mobility  Bed Mobility Overal bed mobility: Modified Independent             General bed mobility comments: HOB elevated    Transfers                        Ambulation/Gait                    Stairs             Wheelchair Mobility    Modified Rankin (Stroke Patients Only)       Balance                                            Cognition Arousal/Alertness: Awake/alert Behavior During Therapy: WFL for tasks assessed/performed Overall Cognitive Status: No family/caregiver present to determine baseline cognitive functioning                                 General Comments: WFL for majority of simple tasks; pt attributes his cognition to "chemo brain", noted decreased attention and memory; suspect near baseline cognition        Exercises      General Comments General comments (skin integrity, edema, etc.): New w/c delivered to room; pt sitting up EOB eating lunch, reports no concerns with w/c-level transfers, which he will continue to do at home. Educ on components of new w/c, pt endorses understanding. Pt reports he has been performing HEP, encouraged to continue doing this at home. preparing for d/c today - pt has no further questions or concerns      Pertinent Vitals/Pain Pain Assessment: No/denies pain Pain Intervention(s): Monitored during session    Home Living  Prior Function            PT Goals (current goals can now be found in the care plan section) Progress towards PT goals: Goals met/education completed, patient discharged from PT    Frequency    Min 3X/week      PT Plan Current plan remains appropriate    Co-evaluation              AM-PAC PT "6 Clicks" Mobility   Outcome Measure  Help needed turning from your back to your side while in a flat bed without using bedrails?: None Help needed moving from lying on your back to sitting on the side of a flat bed without using bedrails?: None Help needed moving to and from a bed to a chair (including a wheelchair)?: A Little Help needed standing up from a chair using your arms (e.g., wheelchair or  bedside chair)?: A Little Help needed to walk in hospital room?: Total Help needed climbing 3-5 steps with a railing? : Total 6 Click Score: 16    End of Session   Activity Tolerance: Patient tolerated treatment well Patient left: in bed;with call bell/phone within reach Nurse Communication: Mobility status PT Visit Diagnosis: Other abnormalities of gait and mobility (R26.89)     Time: 1140-1151 PT Time Calculation (min) (ACUTE ONLY): 11 min  Charges:  $Wheel Chair Management: 8-22 mins                     Mabeline Caras, PT, DPT Acute Rehabilitation Services  Pager [phone] Office [phone]  Derry Lory 10/09/2021, 12:40 PM

## 2021-10-09 NOTE — Telephone Encounter (Signed)
Sch per 1/13 inbasket , unable to leave msg

## 2021-10-09 NOTE — Progress Notes (Signed)
RN removed CT. SWOT RN did d/c. Pt's son transporting pt home in private vehicle with belongings.

## 2021-10-09 NOTE — Discharge Summary (Signed)
Physician Discharge Summary   Patient: [name] MRN: [id number] DOB: 09-01-1961  Admit date:     09/30/2021  Discharge date: 10/09/21  Discharge Physician: Estill Cotta   PCP: Redmond School, MD   Recommendations at discharge:   Follow-up with Dr. Julien Nordmann outpatient scheduled on 10/12/2021, need to follow-up cytology from the pericardial fluid Thyroid studies in 4 to 6 weeks  Discharge Diagnoses    Malignant pericardial effusion   Non-small cell carcinoma of right lung, stage 3 (HCC)   HFrEF (heart failure with reduced ejection fraction) (HCC)   PAD (peripheral artery disease) (HCC)   COPD with chronic bronchitis and emphysema (HCC)   Chronic hypotension   Hypothyroidism   S/P implantation of automatic cardioverter/defibrillator (AICD)   CAD (coronary artery disease)   Aortic atherosclerosis ([place])  Hypokalemia  Hypomagnesemia    Hospital Course   61 year old man complicated PMH including systolic CHF, AICD, non-small cell lung cancer on chemotherapy currently, chronic hypotension, presented with pleuritic chest pain, imaging revealed large pericardial effusion on 2D echo Underwent left video-assisted thoracoscopy, pericardial window and evacuation of pericardial effusion by Dr. Kipp Brood on 1/8 -CTS following.  Drain removed on 1/11.  Chest tube removed on 1/13, cleared by CT VS to be discharged home   * Malignant pericardial effusion- (present on admission) -- 2D echo showed large pericardial effusion, status post left video-assisted thoracoscopy, pericardial window and evacuation of pericardial effusion by Dr. Kipp Brood on 10/04/2021  -Chest tube in place.  Fluid from Pleurovac sent for cytology again on 1/11, results pending -Drain removed on 1/11, chest tube to be removed today per CT VS and cleared to be discharged home after chest tube removal -Chest x-ray showed no pneumothorax  HFrEF (heart failure with reduced ejection fraction) ([place]) -- Cardiology following,  history of ischemic cardiomyopathy/CAD  DES to LAD.   -2D echo showed EF about 30%, normalrightventricularsystolicfunction, large pericardial effusion -Difficult to optimize medical management, with hypotension and multiple medical comorbidities, recommended to hold off on lisinopril. -Limited echo showed EF of 30 to 35%,, normal RV SF, trivial pericardial effusion  Non-small cell carcinoma of right lung, stage 3 ([place])- (present on admission) -- First diagnosed in 2019, follows Dr. Julien Nordmann, undergoing chemotherapy, last chemo was 12/6  -Presumed pericardial effusion to be malignant, follow cytology results, still pending. -Outpatient follow-up scheduled with Dr. Julien Nordmann on 10/12/2021  Aortic atherosclerosis ([place]) -- Continue statin  CAD (coronary artery disease) -- History of ischemic cardiomyopathy, status post ICD, CAD, PEA arrest in 03/2021 -cardiology following, continue aspirin, statin  -Continue midodrine for hypotension   S/P implantation of automatic cardioverter/defibrillator (AICD)- (present on admission) -- Per cardiology.  Currently no acute issues, stable.  Hypothyroidism- (present on admission) -- TSH high at 15.4, looking back consistently high over multiple data points. --Clinical significance unclear, continue Synthroid at current dose of 154mcg daily - outpatient follow-up with PCP, repeat thyroid panel in 4 to 6 weeks.    Chronic hypotension -- Asymptomatic, continue midodrine  COPD with chronic bronchitis and emphysema ([place])- (present on admission) -- Stable, no wheezing, continue bronchodilators as needed, nicotine patch   PAD (peripheral artery disease) ([place])- (present on admission) -- History of right BKA, continue aspirin, statin.  No acute issues  Prolonged QT interval-resolved as of 10/02/2021 --resolved  Hypomagnesemia-resolved as of 10/02/2021 --replete     Pain control - [facility] Controlled Substance Reporting System database was reviewed. and  patient was instructed, not to drive, operate heavy machinery, perform activities at heights, swimming  or participation in water activities or provide baby-sitting services while on Pain, Sleep and Anxiety Medications; until their outpatient Physician has advised to do so again. Also recommended to not to take more than prescribed Pain, Sleep and Anxiety Medications.   Consultants: Cardiothoracic surgery Cardiology Procedures performed: Left video-assisted thoracoscopy, pericardial window and evacuation of the pericardial effusion Disposition: Home Diet recommendation: Regular diet  DISCHARGE MEDICATION: Allergies as of 10/09/2021       Reactions   Lyrica [pregabalin] Palpitations   Chest pain and heart palps.   Neurontin [gabapentin] Palpitations   Chest pain and heart palp        Medication List     STOP taking these medications    lisinopril 2.5 MG tablet Commonly known as: ZESTRIL   potassium chloride SA 20 MEQ tablet Commonly known as: KLOR-CON M       TAKE these medications    albuterol 108 (90 Base) MCG/ACT inhaler Commonly known as: VENTOLIN HFA Inhale 1-2 puffs into the lungs every 4 (four) hours as needed for wheezing or shortness of breath.   alprazolam 2 MG tablet Commonly known as: XANAX Take 2 mg by mouth 4 (four) times daily as needed for anxiety.   amitriptyline 100 MG tablet Commonly known as: ELAVIL Take 100 mg by mouth at bedtime.   aspirin 81 MG EC tablet Take 81 mg by mouth daily.   atorvastatin 80 MG tablet Commonly known as: LIPITOR Take 80 mg by mouth daily.   citalopram 40 MG tablet Commonly known as: CELEXA Take 40 mg by mouth daily.   dexamethasone 4 MG tablet Commonly known as: DECADRON Please take 1 tablet twice a day the day before, the day of, and the day after treatment What changed:  how much to take how to take this when to take this   folic acid 1 MG tablet Commonly known as: FOLVITE Take 1 tablet (1 mg total) by  mouth daily.   furosemide 20 MG tablet Commonly known as: LASIX Take 1 tablet (20 mg total) by mouth daily for 4 days only. What changed:  how much to take how to take this when to take this additional instructions   HYDROcodone-acetaminophen 10-325 MG tablet Commonly known as: NORCO Take 1 tablet by mouth every 4 (four) hours as needed for moderate pain.   levothyroxine 125 MCG tablet Commonly known as: SYNTHROID Take 125 mcg by mouth daily.   lidocaine-prilocaine cream Commonly known as: EMLA Apply 1 application topically as needed. What changed:  when to take this reasons to take this   midodrine 10 MG tablet Commonly known as: PROAMATINE Take 1 tablet (10 mg total) by mouth 3 (three) times daily with meals.   nitroGLYCERIN 0.4 MG SL tablet Commonly known as: NITROSTAT Place 1 tablet (0.4 mg total) under the tongue every 5 (five) minutes x 3 doses as needed for chest pain.   prochlorperazine 10 MG tablet Commonly known as: COMPAZINE Take 1 tablet (10 mg total) by mouth every 6 (six) hours as needed for nausea or vomiting.   sucralfate 1 g tablet Commonly known as: Carafate Take 1 tablet (1 g total) by mouth 4 (four) times daily -  with meals and at bedtime. 5 min before meals for radiation induced esophagitis What changed:  when to take this additional instructions   tamsulosin 0.4 MG Caps capsule Commonly known as: FLOMAX Take 0.4 mg by mouth daily.   Trelegy Ellipta 100-62.5-25 MCG/ACT Aepb Generic drug: Fluticasone-Umeclidin-Vilant Inhale 1 puff into  the lungs daily.               Durable Medical Equipment  (From admission, onward)           Start     Ordered   10/06/21 1445  For home use only DME lightweight manual wheelchair with seat cushion  Once       Comments: Patient suffers from weakness which impairs their ability to perform daily activities like bathing, dressing, and grooming in the home.  A cane, crutch, or walker will not  resolve  issue with performing activities of daily living. A wheelchair will allow patient to safely perform daily activities. Patient is not able to propel themselves in the home using a standard weight wheelchair due to arm weakness, endurance, and general weakness. Patient can self propel in the lightweight wheelchair. Length of need 13 months . Accessories: elevating leg rests (ELRs), wheel locks, extensions and anti-tippers.   10/06/21 1445              Discharge Care Instructions  (From admission, onward)           Start     Ordered   10/09/21 0000  If the dressing is still on your incision site when you go home, remove it on the third day after your surgery date. Remove dressing if it begins to fall off, or if it is dirty or damaged before the third day.        10/09/21 1005   10/09/21 0000  If the dressing is still on your incision site when you go home, remove it on the third day after your surgery date. Remove dressing if it begins to fall off, or if it is dirty or damaged before the third day.        10/09/21 1343            Follow-up Information     Lajuana Matte, MD Follow up on 10/16/2021.   Specialty: Cardiothoracic Surgery Why: Appointment is at 10:30 Contact information: [place] [address] [phone]         Curt Bears, MD Follow up on 10/12/2021.   Specialty: Oncology Why: at 9:45AM, for hospital follow-up Contact information: [place] [address] [phone]         Redmond School, MD. Schedule an appointment as soon as possible for a visit in 2 week(s).   Specialty: Internal Medicine Why: for hospital follow-up Contact information: [address] [place] [address] [phone]         [name], MD .   Specialty: Cardiology Contact information: [place] [address] [phone]         [name], MD  .   Specialty: Cardiology Contact information: [address] [place] [address] [phone]                 Discharge Exam: Danley Danker Weights   10/05/21 0443 10/07/21 0649 10/09/21 0411  Weight: 75.2 kg 76.5 kg 75.9 kg   S: Feeling better, hoping to go home today  BP 92/66 (BP Location: Left Arm)    Pulse 80    Temp (!) 97.5 F (36.4 C) (Oral)    Resp 18    Ht 6' (1.829 m)    Wt 75.9 kg    SpO2 98%    BMI 22.69 kg/m    Physical Exam General: Alert and oriented x  3, NAD Cardiovascular: S1 S2 clear, RRR. No pedal edema b/l Respiratory: Diminished breath sound at the bases Gastrointestinal: Soft, nontender, nondistended, NBS Ext: no pedal edema bilaterally Psych: Normal affect and demeanor, alert and oriented x3    Condition at discharge: good  The results of significant diagnostics from this hospitalization (including imaging, microbiology, ancillary and laboratory) are listed below for reference.   Imaging Studies: DG Chest 2 View  Result Date: 10/09/2021 CLINICAL DATA:  Chest tube present EXAM: CHEST - 2 VIEW COMPARISON:  10/06/2021 FINDINGS: Left apically directed chest tube remains present. Right chest port with catheter as before. Shallow inspiration with low lung volumes. Similar appearance of right hemithorax with superior retraction of the hilum and right apical opacity. Left lung remains clear. No pneumothorax. Stable cardiomediastinal contours. Left chest wall ICD. IMPRESSION: Left apically directed chest tube.  No pneumothorax. Stable lung aeration. Electronically Signed   By: Macy Mis M.D.   On: 10/09/2021 08:45   CT Angio Chest PE W and/or Wo Contrast  Result Date: 09/30/2021 CLINICAL DATA:  Intermittent sharp chest pain for the last 4 days, lung cancer EXAM: CT ANGIOGRAPHY CHEST WITH CONTRAST TECHNIQUE: Multidetector CT imaging of the chest was performed using the standard protocol during bolus administration of intravenous contrast.  Multiplanar CT image reconstructions and MIPs were obtained to evaluate the vascular anatomy. CONTRAST:  67mL OMNIPAQUE IOHEXOL 350 MG/ML SOLN COMPARISON:  07/23/2021 FINDINGS: Cardiovascular: This is a technically adequate evaluation of the pulmonary vasculature. No filling defects or pulmonary emboli. Large pericardial is effusion is identified, measuring up to 2.3 cm in thickness. The heart is not enlarged. Dual lead pacer is identified. No evidence of thoracic aortic aneurysm or dissection. Atherosclerosis of the aorta and coronary vasculature. Mediastinum/Nodes: No enlarged mediastinal, hilar, or axillary lymph nodes. Thyroid gland, trachea, and esophagus demonstrate no significant findings. Lungs/Pleura: Stable post therapeutic changes within the right upper lobe, with continued cavitary lesion measuring 4.9 x 3.0 cm. Dense fibrosis and consolidation within the right upper lobe unchanged. Stable 0.8 cm left upper lobe ground-glass pulmonary nodule. Continued bilateral bronchial wall thickening, right greater than left. Trace bilateral pleural fluid. No pneumothorax. Upper Abdomen: No acute abnormality. Musculoskeletal: No acute or destructive bony lesions. Reconstructed images demonstrate no additional findings. Review of the MIP images confirms the above findings. IMPRESSION: 1. No evidence of pulmonary embolus. 2. Stable post therapeutic changes within the right upper lobe, with fibrosis, dense consolidation, and cavitation as above. If recurrent or residual disease is suspected, PET CT could be performed. 3. Stable nonspecific 0.8 cm ground-glass left upper lobe pulmonary nodule. 4. Large pericardial effusion, increased since prior study. 5. Trace bilateral pleural fluid. 6.  Aortic Atherosclerosis (ICD10-I70.0). Electronically Signed   By: Randa Ngo M.D.   On: 09/30/2021 20:37   DG CHEST PORT 1 VIEW  Result Date: 10/06/2021 CLINICAL DATA:  Chest tube present, malignant pericardial effusion EXAM:  PORTABLE CHEST 1 VIEW COMPARISON:  10/04/2021 FINDINGS: Shallow inspiration with low lung volumes. Similar appearance of the right hemithorax with superior retraction of the hilum and right apical opacity. Lungs are otherwise clear. No pleural effusion or pneumothorax identified. Stable cardiomediastinal contours. Chest tubes and right chest wall port catheter again identified. IMPRESSION: No substantial change from the prior study. Electronically Signed   By: Macy Mis M.D.   On: 10/06/2021 08:19   DG CHEST PORT 1 VIEW  Result Date: 10/04/2021 CLINICAL DATA:  Reason for exam: Chest tube, central line, port placement Post operative EXAM:  PORTABLE CHEST 1 VIEW COMPARISON:  03/24/2021.  Chest CT, 09/30/2021. FINDINGS: Cardiac silhouette normal in size. Stable left anterior chest wall AICD. No mediastinal or left hilar masses. Right hilum is retracted superiorly, mostly obscured by right upper lobe, apical opacity, stable from the prior chest radiograph. Remainder of the lungs is clear. Two chest tubes on the left, 1 curling over the left apex, the other extending over the cardiac silhouette, tip superimposed over the right heart border. No convincing pneumothorax.  No pleural effusion. Small amount of subcutaneous air is seen along the lateral lower left hemithorax. Right internal jugular Port-A-Cath and left internal jugular dual lumen central venous catheters. Both are stable from the recent CT. IMPRESSION: 1. Two left-sided chest tubes as detailed. No convincing left pneumothorax. 2. Right upper lobe/apical opacity unchanged from the recent CT, consistent with chronic scarring. Remainder of the lungs is clear. Electronically Signed   By: Lajean Manes M.D.   On: 10/04/2021 10:57   ECHOCARDIOGRAM COMPLETE  Result Date: 10/01/2021    ECHOCARDIOGRAM REPORT   Patient Name:   [name] Date of Exam: 10/01/2021 Medical Rec #:  [id number]   Height:       72.0 in Accession #:    [id number]  Weight:       171.1 lb  Date of Birth:  08-19-1961   BSA:          1.994 m Patient Age:    39 years    BP:           78/57 mmHg Patient Gender: M           HR:           82 bpm. Exam Location:  Inpatient Procedure: 2D Echo, Color Doppler, Cardiac Doppler and Intracardiac            Opacification Agent Indications:    I31.3 Pericardial effusion  History:        Patient has prior history of Echocardiogram examinations, most                 recent 03/24/2021. Defibrillator, COPD; Risk                 Factors:Dyslipidemia.  Sonographer:    Raquel Sarna Senior RDCS Referring Phys: [id number] VISHAL R PATEL  Sonographer Comments: Very poor echo windows due to COPD and Lung Cancer IMPRESSIONS  1. Poor acoustic windows Definity used. LVEF is depressed. There is akinesis of the mid/distal septum, distal anterior and apical walls. Overall LVEF is probably 30%.  2. Right ventricular systolic function is normal. The right ventricular size is normal.  3. Large pericardial effusion surrounds heart with consolidatation. Measures 17 mm in maximal dimension. More prominent than in echo done in June 2022     Effusion is most prominent at apex and posteriorly     There is mitral inflow variation. The IVC is dilated with blunted respiratory collapse     There is no definite RV collapse, RA collapse, however poor acoustic windows limit apical views.     Concerning for hemodynamic compromise. Case reviewed with W Camnitz.  4. The mitral valve is grossly normal. No evidence of mitral valve regurgitation.  5. The aortic valve is normal in structure. Aortic valve regurgitation is not visualized. FINDINGS  Left Ventricle: Poor acoustic windows Definity used. LVEF is depressed. There is akinesis of the mid/distal septum, distal anterior and apical walls. Overall LVEF is probably 30%. Definity contrast agent was given IV to  delineate the left ventricular endocardial borders. The left ventricular internal cavity size was normal in size. There is no left ventricular hypertrophy.  Right Ventricle: The right ventricular size is normal. Right vetricular wall thickness was not assessed. Right ventricular systolic function is normal. Left Atrium: Left atrial size was normal in size. Right Atrium: Right atrial size was normal in size. Pericardium: Large pericardial effusion surrounds heart with consolidatation. Measures 17 mm in maximal dimension. More prominent than in echo done in June 2022 Effusion is most prominent at apex and posteriorly There is mitral inflow variation. The IVC is dilated with blunted respiratory collapse There is no definite RV collapse, RA collapse, however poor acoustic windows limit apical views. Concerning for hemodynamic compromise. Case reviewed with W Camnitz. Mitral Valve: The mitral valve is grossly normal. No evidence of mitral valve regurgitation. Tricuspid Valve: The tricuspid valve is grossly normal. Tricuspid valve regurgitation is not demonstrated. Aortic Valve: The aortic valve is normal in structure. Aortic valve regurgitation is not visualized. Pulmonic Valve: The pulmonic valve was not well visualized. Aorta: The aortic root is normal in size and structure. IAS/Shunts: The interatrial septum was not assessed. Additional Comments: A device lead is visualized.  LEFT VENTRICLE PLAX 2D LVIDd:         4.50 cm LVIDs:         3.30 cm LV PW:         0.90 cm LV IVS:        0.80 cm LVOT diam:     1.90 cm LV SV:         38 LV SV Index:   19 LVOT Area:     2.84 cm  LV Volumes (MOD) LV vol d, MOD A2C: 112.0 ml LV vol d, MOD A4C: 118.0 ml LV vol s, MOD A2C: 74.3 ml LV vol s, MOD A4C: 48.4 ml LV SV MOD A2C:     37.7 ml LV SV MOD A4C:     118.0 ml LV SV MOD BP:      54.2 ml RIGHT VENTRICLE RV S prime:     8.81 cm/s TAPSE (M-mode): 1.1 cm LEFT ATRIUM             Index        RIGHT ATRIUM           Index LA diam:        3.20 cm 1.61 cm/m   RA Area:     13.00 cm LA Vol (A2C):   42.5 ml 21.32 ml/m  RA Volume:   29.90 ml  15.00 ml/m LA Vol (A4C):   50.3 ml 25.23 ml/m LA  Biplane Vol: 49.1 ml 24.63 ml/m  AORTIC VALVE LVOT Vmax:   74.60 cm/s LVOT Vmean:  55.700 cm/s LVOT VTI:    0.133 m  AORTA Ao Root diam: 2.50 cm  SHUNTS Systemic VTI:  0.13 m Systemic Diam: 1.90 cm Dorris Carnes MD Electronically signed by Dorris Carnes MD Signature Date/Time: 10/01/2021/9:41:28 AM    Final    ECHOCARDIOGRAM LIMITED  Result Date: 10/08/2021    ECHOCARDIOGRAM LIMITED REPORT   Patient Name:   [name] Date of Exam: 10/08/2021 Medical Rec #:  [id number]   Height:       72.0 in Accession #:    [id number]  Weight:       168.7 lb Date of Birth:  [birth year]   BSA:          1.982 m Patient Age:  60 years    BP:           85/58 mmHg Patient Gender: M           HR:           82 bpm. Exam Location:  Inpatient Procedure: Limited Echo and Cardiac Doppler Indications:    Pericardial effusion I31.3  History:        Patient has prior history of Echocardiogram examinations, most                 recent 10/01/2021. Idiopathic CMP, Previous Myocardial Infarction                 and Acute MI, COPD; Risk Factors:Current Smoker.  Sonographer:    Bernadene Person RDCS Referring Phys: [id number] [place]  1. Normal left ventricular size and wall thickness. Mid to apical anteroseptal and anterior akinesis with EF estimated 30-35%. Not all wall segments were visualized.  2. Right ventricular systolic function is normal. The right ventricular size is normal.  3. The inferior vena cava is normal in size with greater than 50% respiratory variability, suggesting right atrial pressure of 3 mmHg.  4. Trivial pericardial effusion.  5. Limited echo for pericardial effusion. Very poor acoustic windows. FINDINGS  Left Ventricle: Left ventricular ejection fraction, by estimation, is 30 to 35%. The left ventricle has moderately decreased function. The left ventricle demonstrates regional wall motion abnormalities. The left ventricular internal cavity size was normal in size. There is no left ventricular hypertrophy. Right  Ventricle: The right ventricular size is normal. Right ventricular systolic function is normal. Pericardium: Trivial pericardial effusion is present. Venous: The inferior vena cava is normal in size with greater than 50% respiratory variability, suggesting right atrial pressure of 3 mmHg. Dalton McleanMD Electronically signed by Franki Monte Signature Date/Time: 10/08/2021/4:44:42 PM    Final     Microbiology: Results for orders placed or performed during the hospital encounter of 09/30/21  Resp Panel by RT-PCR (Flu A&B, Covid) Nasopharyngeal Swab     Status: None   Collection Time: 09/30/21  7:15 PM   Specimen: Nasopharyngeal Swab; Nasopharyngeal(NP) swabs in vial transport medium  Result Value Ref Range Status   SARS Coronavirus 2 by RT PCR NEGATIVE NEGATIVE Final    Comment: (NOTE) SARS-CoV-2 target nucleic acids are NOT DETECTED.  The SARS-CoV-2 RNA is generally detectable in upper respiratory specimens during the acute phase of infection. The lowest concentration of SARS-CoV-2 viral copies this assay can detect is 138 copies/mL. A negative result does not preclude SARS-Cov-2 infection and should not be used as the sole basis for treatment or other patient management decisions. A negative result may occur with  improper specimen collection/handling, submission of specimen other than nasopharyngeal swab, presence of viral mutation(s) within the areas targeted by this assay, and inadequate number of viral copies(<138 copies/mL). A negative result must be combined with clinical observations, patient history, and epidemiological information. The expected result is Negative.  Fact Sheet for Patients:  EntrepreneurPulse.com.au  Fact Sheet for Healthcare Providers:  IncredibleEmployment.be  This test is no t yet approved or cleared by the [place] FDA and  has been authorized for detection and/or diagnosis of SARS-CoV-2 by FDA under an Emergency  Use Authorization (EUA). This EUA will remain  in effect (meaning this test can be used) for the duration of the COVID-19 declaration under Section 564(b)(1) of the Act, 21 U.S.C.section 360bbb-3(b)(1), unless the authorization is terminated  or revoked sooner.  Influenza A by PCR NEGATIVE NEGATIVE Final   Influenza B by PCR NEGATIVE NEGATIVE Final    Comment: (NOTE) The Xpert Xpress SARS-CoV-2/FLU/RSV plus assay is intended as an aid in the diagnosis of influenza from Nasopharyngeal swab specimens and should not be used as a sole basis for treatment. Nasal washings and aspirates are unacceptable for Xpert Xpress SARS-CoV-2/FLU/RSV testing.  Fact Sheet for Patients: EntrepreneurPulse.com.au  Fact Sheet for Healthcare Providers: IncredibleEmployment.be  This test is not yet approved or cleared by the [place] FDA and has been authorized for detection and/or diagnosis of SARS-CoV-2 by FDA under an Emergency Use Authorization (EUA). This EUA will remain in effect (meaning this test can be used) for the duration of the COVID-19 declaration under Section 564(b)(1) of the Act, 21 U.S.C. section 360bbb-3(b)(1), unless the authorization is terminated or revoked.  Performed at [facility], [place] [address] [place], [place] [address]   Culture, blood (Routine x 2)     Status: None   Collection Time: 09/30/21  7:45 PM   Specimen: BLOOD  Result Value Ref Range Status   Specimen Description   Final    BLOOD PORTA CATH Performed at [facility] [address], [place], [place] [address]    Special Requests   Final    BOTTLES DRAWN AEROBIC AND ANAEROBIC Blood Culture adequate volume Performed at [facility] [address], [place], [place] [address]    Culture   Final    NO GROWTH 5 DAYS Performed at [facility], [place] [address], [place], [place] [address]    Report Status  10/05/2021 FINAL  Final  Culture, blood (Routine x 2)     Status: Abnormal   Collection Time: 09/30/21 10:25 PM   Specimen: BLOOD  Result Value Ref Range Status   Specimen Description   Final    BLOOD BLOOD LEFT WRIST Performed at [facility] [address] [place], [place] [address]    Special Requests   Final    BOTTLES DRAWN AEROBIC ONLY Blood Culture results may not be optimal due to an inadequate volume of blood received in culture bottles Performed at [facility] [address], [place], [place] [address]    Culture  Setup Time   Final    GRAM POSITIVE COCCI IN CLUSTERS AEROBIC BOTTLE ONLY CRITICAL RESULT CALLED TO, READ BACK BY AND VERIFIED WITH: D,WOFFORD PHARMD @1451  10/02/21 EB    Culture (A)  Final    STAPHYLOCOCCUS EPIDERMIDIS THE SIGNIFICANCE OF ISOLATING THIS ORGANISM FROM A SINGLE SET OF BLOOD CULTURES WHEN MULTIPLE SETS ARE DRAWN IS UNCERTAIN. PLEASE NOTIFY THE MICROBIOLOGY DEPARTMENT WITHIN ONE WEEK IF SPECIATION AND SENSITIVITIES ARE REQUIRED. Performed at [facility], [place] [address], [place], [place] [address]    Report Status 10/04/2021 FINAL  Final  Blood Culture ID Panel (Reflexed)     Status: Abnormal   Collection Time: 09/30/21 10:25 PM  Result Value Ref Range Status   Enterococcus faecalis NOT DETECTED NOT DETECTED Final   Enterococcus Faecium NOT DETECTED NOT DETECTED Final   Listeria monocytogenes NOT DETECTED NOT DETECTED Final   Staphylococcus species DETECTED (A) NOT DETECTED Final    Comment: CRITICAL RESULT CALLED TO, READ BACK BY AND VERIFIED WITH: D,WOFFORD PHARMD @1451  10/02/21 EB    Staphylococcus aureus (BCID) NOT DETECTED NOT DETECTED Final   Staphylococcus epidermidis DETECTED (A) NOT DETECTED Final    Comment: CRITICAL RESULT CALLED TO, READ  BACK BY AND VERIFIED WITH: D,WOFFORD PHARMD @1451  10/02/21 EB    Staphylococcus lugdunensis NOT DETECTED NOT DETECTED Final   Streptococcus species NOT  DETECTED NOT DETECTED Final   Streptococcus agalactiae NOT DETECTED NOT DETECTED Final   Streptococcus pneumoniae NOT DETECTED NOT DETECTED Final   Streptococcus pyogenes NOT DETECTED NOT DETECTED Final   A.calcoaceticus-baumannii NOT DETECTED NOT DETECTED Final   Bacteroides fragilis NOT DETECTED NOT DETECTED Final   Enterobacterales NOT DETECTED NOT DETECTED Final   Enterobacter cloacae complex NOT DETECTED NOT DETECTED Final   Escherichia coli NOT DETECTED NOT DETECTED Final   Klebsiella aerogenes NOT DETECTED NOT DETECTED Final   Klebsiella oxytoca NOT DETECTED NOT DETECTED Final   Klebsiella pneumoniae NOT DETECTED NOT DETECTED Final   Proteus species NOT DETECTED NOT DETECTED Final   Salmonella species NOT DETECTED NOT DETECTED Final   Serratia marcescens NOT DETECTED NOT DETECTED Final   Haemophilus influenzae NOT DETECTED NOT DETECTED Final   Neisseria meningitidis NOT DETECTED NOT DETECTED Final   Pseudomonas aeruginosa NOT DETECTED NOT DETECTED Final   Stenotrophomonas maltophilia NOT DETECTED NOT DETECTED Final   Candida albicans NOT DETECTED NOT DETECTED Final   Candida auris NOT DETECTED NOT DETECTED Final   Candida glabrata NOT DETECTED NOT DETECTED Final   Candida krusei NOT DETECTED NOT DETECTED Final   Candida parapsilosis NOT DETECTED NOT DETECTED Final   Candida tropicalis NOT DETECTED NOT DETECTED Final   Cryptococcus neoformans/gattii NOT DETECTED NOT DETECTED Final   Methicillin resistance mecA/C NOT DETECTED NOT DETECTED Final    Comment: Performed at [facility], [address] [place], [place] [address]  MRSA Next Gen by PCR, Nasal     Status: None   Collection Time: 10/01/21  2:06 AM   Specimen: Nasal Mucosa; Nasal Swab  Result Value Ref Range Status   MRSA by PCR Next Gen NOT DETECTED NOT DETECTED Final    Comment: (NOTE) The GeneXpert MRSA Assay (FDA approved for NASAL specimens only), is one component of a comprehensive MRSA colonization  surveillance program. It is not intended to diagnose MRSA infection nor to guide or monitor treatment for MRSA infections. Test performance is not FDA approved in patients less than 30 years old. Performed at [facility], [place] [address] [place], [place] [address]   Surgical pcr screen     Status: None   Collection Time: 10/02/21 10:16 PM   Specimen: Nasal Mucosa; Nasal Swab  Result Value Ref Range Status   MRSA, PCR NEGATIVE NEGATIVE Final   Staphylococcus aureus NEGATIVE NEGATIVE Final    Comment: (NOTE) The Xpert SA Assay (FDA approved for NASAL specimens in patients 26 years of age and older), is one component of a comprehensive surveillance program. It is not intended to diagnose infection nor to guide or monitor treatment. Performed at [facility], [place] [address] [place], [place] [address]     Labs: CBC: Recent Labs  Lab 10/05/21 0032 10/06/21 0938 10/07/21 0355  WBC 12.0* 8.6 7.4  NEUTROABS  --   --  5.5  HGB 11.1* 10.6* 10.2*  HCT 34.2* 32.5* 32.3*  MCV 98.0 99.7 100.0  PLT 188 164 734   Basic Metabolic Panel: Recent Labs  Lab 10/04/21 0643 10/05/21 0032 10/06/21 0938 10/07/21 0355  NA 132* 134* 131* 134*  K 3.8 4.1 3.7 3.6  CL 96* 98 92* 99  CO2 24 28 29 29   GLUCOSE 115* 115* 110* 92  BUN 9 9 9  7  CREATININE 0.98 0.80 0.92 0.96  CALCIUM 9.1 8.3* 8.6* 8.1*  MG 1.8 1.7  --  1.8   Liver Function Tests: No results for input(s): AST, ALT, ALKPHOS, BILITOT, PROT, ALBUMIN in the last 168 hours. CBG: No results for input(s): GLUCAP in the last 168 hours.  Discharge time spent: greater than 30 minutes.  Signed: Estill Cotta, MD [facility] 10/09/2021

## 2021-10-09 NOTE — Progress Notes (Addendum)
° °   °  [address]       [place] [address]             [phone]      5 Days Post-Op Procedure(s) (LRB): PERICARDIAL WINDOW VIDEO ASSISTED THORACOSCOPY APPROACH (Left)  Subjective:  No new complaints.  Happy to be going home.   Objective: Vital signs in last 24 hours: Temp:  [97.3 F (36.3 C)-98.6 F (37 C)] 97.5 F (36.4 C) (01/13 0730) Pulse Rate:  [73-87] 80 (01/13 0730) Cardiac Rhythm: Normal sinus rhythm (01/12 1900) Resp:  [14-19] 18 (01/13 0730) BP: (83-112)/(65-69) 92/66 (01/13 0730) SpO2:  [94 %-98 %] 94 % (01/13 0730) Weight:  [75.9 kg] 75.9 kg (01/13 0411)  Intake/Output from previous day: 01/12 0701 - 01/13 0700 In: 1090 [P.O.:1080; I.V.:10] Out: 2359 [Urine:2325; Chest Tube:34]  General appearance: alert, cooperative, and no distress Heart: regular rate and rhythm Lungs: wheezes bilaterally Abdomen: soft, non-tender; bowel sounds normal; no masses,  no organomegaly Extremities: extremities normal, atraumatic, no cyanosis or edema Wound: clean and dry  Lab Results: Recent Labs    10/06/21 0938 10/07/21 0355  WBC 8.6 7.4  HGB 10.6* 10.2*  HCT 32.5* 32.3*  PLT 164 169   BMET:  Recent Labs    10/06/21 0938 10/07/21 0355  NA 131* 134*  K 3.7 3.6  CL 92* 99  CO2 29 29  GLUCOSE 110* 92  BUN 9 7  CREATININE 0.92 0.96  CALCIUM 8.6* 8.1*    PT/INR: No results for input(s): LABPROT, INR in the last 72 hours. ABG    Component Value Date/Time   PHART 7.312 (L) 03/24/2021 1600   HCO3 27.5 03/24/2021 1600   TCO2 24 05/26/2016 0132   O2SAT 99.6 03/24/2021 1600   CBG (last 3)  No results for input(s): GLUCAP in the last 72 hours.  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

## 2021-10-09 NOTE — Care Management Important Message (Signed)
Important Message  Patient Details  Name: COLMAN BIRDWELL MRN: [id number] Date of Birth: April 13, 1961   Medicare Important Message Given:  Yes     Shelda Altes 10/09/2021, 3:16 PM

## 2021-10-12 ENCOUNTER — Other Ambulatory Visit: Payer: Medicare Other

## 2021-10-12 ENCOUNTER — Ambulatory Visit: Payer: Medicare Other

## 2021-10-12 ENCOUNTER — Ambulatory Visit: Payer: Medicare Other | Admitting: Internal Medicine

## 2021-10-12 ENCOUNTER — Telehealth: Payer: Self-pay | Admitting: Internal Medicine

## 2021-10-12 NOTE — Telephone Encounter (Signed)
Sch per 1/13 inbasket, unable to leave pt message. Notified PA

## 2021-10-13 ENCOUNTER — Telehealth: Payer: Self-pay | Admitting: Internal Medicine

## 2021-10-13 NOTE — Telephone Encounter (Signed)
Sch per 1/12 los, pt wife aware

## 2021-10-14 ENCOUNTER — Encounter: Payer: Self-pay | Admitting: Internal Medicine

## 2021-10-14 ENCOUNTER — Other Ambulatory Visit: Payer: Self-pay

## 2021-10-14 ENCOUNTER — Inpatient Hospital Stay: Payer: Medicare Other

## 2021-10-14 ENCOUNTER — Inpatient Hospital Stay: Payer: Medicare Other | Attending: Physician Assistant

## 2021-10-14 ENCOUNTER — Inpatient Hospital Stay (HOSPITAL_BASED_OUTPATIENT_CLINIC_OR_DEPARTMENT_OTHER): Payer: Medicare Other | Admitting: Internal Medicine

## 2021-10-14 VITALS — BP 94/56 | HR 115 | Temp 96.9°F | Resp 19 | Ht 72.0 in | Wt 169.9 lb

## 2021-10-14 DIAGNOSIS — Z86718 Personal history of other venous thrombosis and embolism: Secondary | ICD-10-CM | POA: Diagnosis not present

## 2021-10-14 DIAGNOSIS — Z923 Personal history of irradiation: Secondary | ICD-10-CM | POA: Diagnosis not present

## 2021-10-14 DIAGNOSIS — Z7982 Long term (current) use of aspirin: Secondary | ICD-10-CM | POA: Diagnosis not present

## 2021-10-14 DIAGNOSIS — Z95828 Presence of other vascular implants and grafts: Secondary | ICD-10-CM

## 2021-10-14 DIAGNOSIS — E039 Hypothyroidism, unspecified: Secondary | ICD-10-CM | POA: Diagnosis not present

## 2021-10-14 DIAGNOSIS — Z9221 Personal history of antineoplastic chemotherapy: Secondary | ICD-10-CM | POA: Diagnosis not present

## 2021-10-14 DIAGNOSIS — C3490 Malignant neoplasm of unspecified part of unspecified bronchus or lung: Secondary | ICD-10-CM

## 2021-10-14 DIAGNOSIS — C3491 Malignant neoplasm of unspecified part of right bronchus or lung: Secondary | ICD-10-CM

## 2021-10-14 DIAGNOSIS — Z5111 Encounter for antineoplastic chemotherapy: Secondary | ICD-10-CM | POA: Diagnosis not present

## 2021-10-14 DIAGNOSIS — C3411 Malignant neoplasm of upper lobe, right bronchus or lung: Secondary | ICD-10-CM | POA: Insufficient documentation

## 2021-10-14 DIAGNOSIS — Z79899 Other long term (current) drug therapy: Secondary | ICD-10-CM | POA: Diagnosis not present

## 2021-10-14 DIAGNOSIS — I252 Old myocardial infarction: Secondary | ICD-10-CM | POA: Diagnosis not present

## 2021-10-14 DIAGNOSIS — D649 Anemia, unspecified: Secondary | ICD-10-CM

## 2021-10-14 LAB — CBC WITH DIFFERENTIAL (CANCER CENTER ONLY)
Abs Immature Granulocytes: 0.36 10*3/uL — ABNORMAL HIGH (ref 0.00–0.07)
Basophils Absolute: 0 10*3/uL (ref 0.0–0.1)
Basophils Relative: 0 %
Eosinophils Absolute: 0 10*3/uL (ref 0.0–0.5)
Eosinophils Relative: 0 %
HCT: 33 % — ABNORMAL LOW (ref 39.0–52.0)
Hemoglobin: 10.8 g/dL — ABNORMAL LOW (ref 13.0–17.0)
Immature Granulocytes: 2 %
Lymphocytes Relative: 3 %
Lymphs Abs: 0.5 10*3/uL — ABNORMAL LOW (ref 0.7–4.0)
MCH: 31.2 pg (ref 26.0–34.0)
MCHC: 32.7 g/dL (ref 30.0–36.0)
MCV: 95.4 fL (ref 80.0–100.0)
Monocytes Absolute: 0.5 10*3/uL (ref 0.1–1.0)
Monocytes Relative: 3 %
Neutro Abs: 14.5 10*3/uL — ABNORMAL HIGH (ref 1.7–7.7)
Neutrophils Relative %: 92 %
Platelet Count: 248 10*3/uL (ref 150–400)
RBC: 3.46 MIL/uL — ABNORMAL LOW (ref 4.22–5.81)
RDW: 14.8 % (ref 11.5–15.5)
WBC Count: 15.8 10*3/uL — ABNORMAL HIGH (ref 4.0–10.5)
nRBC: 0 % (ref 0.0–0.2)

## 2021-10-14 LAB — CMP (CANCER CENTER ONLY)
ALT: 17 U/L (ref 0–44)
AST: 19 U/L (ref 15–41)
Albumin: 3.7 g/dL (ref 3.5–5.0)
Alkaline Phosphatase: 100 U/L (ref 38–126)
Anion gap: 9 (ref 5–15)
BUN: 11 mg/dL (ref 6–20)
CO2: 29 mmol/L (ref 22–32)
Calcium: 9.2 mg/dL (ref 8.9–10.3)
Chloride: 97 mmol/L — ABNORMAL LOW (ref 98–111)
Creatinine: 0.92 mg/dL (ref 0.61–1.24)
GFR, Estimated: >60 mL/min (ref >60)
Glucose, Bld: 203 mg/dL — ABNORMAL HIGH (ref 70–99)
Potassium: 3.7 mmol/L (ref 3.5–5.1)
Sodium: 135 mmol/L (ref 135–145)
Total Bilirubin: 0.4 mg/dL (ref 0.3–1.2)
Total Protein: 6.9 g/dL (ref 6.5–8.1)

## 2021-10-14 LAB — CYTOLOGY - NON PAP

## 2021-10-14 LAB — SAMPLE TO BLOOD BANK

## 2021-10-14 MED ORDER — SODIUM CHLORIDE 0.9 % IV SOLN
400.0000 mg/m2 | Freq: Once | INTRAVENOUS | Status: AC
  Administered 2021-10-14: 800 mg via INTRAVENOUS
  Filled 2021-10-14: qty 20

## 2021-10-14 MED ORDER — SODIUM CHLORIDE 0.9 % IV SOLN: 400.0000 mg/m2 | Freq: Once | INTRAVENOUS | Status: DC

## 2021-10-14 MED ORDER — SODIUM CHLORIDE 0.9 % IV SOLN: Freq: Once | INTRAVENOUS | Status: AC

## 2021-10-14 MED ORDER — PROCHLORPERAZINE EDISYLATE 10 MG/2ML IJ SOLN
10.0000 mg | Freq: Once | INTRAMUSCULAR | Status: AC
  Administered 2021-10-14: 10 mg via INTRAVENOUS
  Filled 2021-10-14: qty 2

## 2021-10-14 MED ORDER — SODIUM CHLORIDE 0.9% FLUSH
10.0000 mL | Freq: Once | INTRAVENOUS | Status: AC
  Administered 2021-10-14: 10 mL

## 2021-10-14 NOTE — Progress Notes (Signed)
Spoke with MD Julien Nordmann and he would like to adjust alimta dose for weight loss.   Larene Beach, PharmD

## 2021-10-14 NOTE — Patient Instructions (Signed)
[facility] ONCOL[facility]  Discharge Instructions: Thank you for choosing [facility] to provide your oncology and hematology care.   If you have a lab appointment with the [facility], please go directly to the [facility] and check in at the registration area.   Wear comfortable clothing and clothing appropriate for easy access to any Portacath or PICC line.   We strive to give you quality time with your provider. You may need to reschedule your appointment if you arrive late (15 or more minutes).  Arriving late affects you and other patients whose appointments are after yours.  Also, if you miss three or more appointments without notifying the office, you may be dismissed from the clinic at the providers discretion.      For prescription refill requests, have your pharmacy contact our office and allow 72 hours for refills to be completed.    Today you received the following chemotherapy and/or immunotherapy agents Alimta      To help prevent nausea and vomiting after your treatment, we encourage you to take your nausea medication as directed.  BELOW ARE SYMPTOMS THAT SHOULD BE REPORTED IMMEDIATELY: *FEVER GREATER THAN 100.4 F (38 C) OR HIGHER *CHILLS OR SWEATING *NAUSEA AND VOMITING THAT IS NOT CONTROLLED WITH YOUR NAUSEA MEDICATION *UNUSUAL SHORTNESS OF BREATH *UNUSUAL BRUISING OR BLEEDING *URINARY PROBLEMS (pain or burning when urinating, or frequent urination) *BOWEL PROBLEMS (unusual diarrhea, constipation, pain near the anus) TENDERNESS IN MOUTH AND THROAT WITH OR WITHOUT PRESENCE OF ULCERS (sore throat, sores in mouth, or a toothache) UNUSUAL RASH, SWELLING OR PAIN  UNUSUAL VAGINAL DISCHARGE OR ITCHING   Items with * indicate a potential emergency and should be followed up as soon as possible or go to the Emergency Department if any problems should occur.  Please show the CHEMOTHERAPY ALERT CARD or IMMUNOTHERAPY ALERT CARD at check-in to the  Emergency Department and triage nurse.  Should you have questions after your visit or need to cancel or reschedule your appointment, please contact [facility]  Dept: [phone]  and follow the prompts.  Office hours are 8:00 a.m. to 4:30 p.m. Monday - Friday. Please note that voicemails left after 4:00 p.m. may not be returned until the following business day.  We are closed weekends and major holidays. You have access to a nurse at all times for urgent questions. Please call the main number to the clinic Dept: [phone] and follow the prompts.   For any non-urgent questions, you may also contact your provider using MyChart. We now offer e-Visits for anyone 67 and older to request care online for non-urgent symptoms. For details visit [email or web address].   Also download the MyChart app! Go to the app store, search "MyChart", open the app, select [facility], and log in with your MyChart username and password.  Due to Covid, a mask is required upon entering the hospital/clinic. If you do not have a mask, one will be given to you upon arrival. For doctor visits, patients may have 1 support person aged 19 or older with them. For treatment visits, patients cannot have anyone with them due to current Covid guidelines and our immunocompromised population.

## 2021-10-14 NOTE — Patient Instructions (Signed)
Steps to Quit Smoking Smoking tobacco is the leading cause of preventable death. It can affect almost every organ in the body. Smoking puts you and people around you at risk for many serious, long-lasting (chronic) diseases. Quitting smoking can be hard, but it is one of the best things that you can do for your health. It is never too late to quit. How do I get ready to quit? When you decide to quit smoking, make a plan to help you succeed. Before you quit: Pick a date to quit. Set a date within the next 2 weeks to give you time to prepare. Write down the reasons why you are quitting. Keep this list in places where you will see it often. Tell your family, friends, and co-workers that you are quitting. Their support is important. Talk with your doctor about the choices that may help you quit. Find out if your health insurance will pay for these treatments. Know the people, places, things, and activities that make you want to smoke (triggers). Avoid them. What first steps can I take to quit smoking? Throw away all cigarettes at home, at work, and in your car. Throw away the things that you use when you smoke, such as ashtrays and lighters. Clean your car. Make sure to empty the ashtray. Clean your home, including curtains and carpets. What can I do to help me quit smoking? Talk with your doctor about taking medicines and seeing a counselor at the same time. You are more likely to succeed when you do both. If you are pregnant or breastfeeding, talk with your doctor about counseling or other ways to quit smoking. Do not take medicine to help you quit smoking unless your doctor tells you to do so. To quit smoking: Quit right away Quit smoking totally, instead of slowly cutting back on how much you smoke over a period of time. Go to counseling. You are more likely to quit if you go to counseling sessions regularly. Take medicine You may take medicines to help you quit. Some medicines need a  prescription, and some you can buy over-the-counter. Some medicines may contain a drug called nicotine to replace the nicotine in cigarettes. Medicines may: Help you to stop having the desire to smoke (cravings). Help to stop the problems that come when you stop smoking (withdrawal symptoms). Your doctor may ask you to use: Nicotine patches, gum, or lozenges. Nicotine inhalers or sprays. Non-nicotine medicine that is taken by mouth. Find resources Find resources and other ways to help you quit smoking and remain smoke-free after you quit. These resources are most helpful when you use them often. They include: Online chats with a counselor. Phone quitlines. Printed self-help materials. Support groups or group counseling. Text messaging programs. Mobile phone apps. Use apps on your mobile phone or tablet that can help you stick to your quit plan. There are many free apps for mobile phones and tablets as well as websites. Examples include Quit Guide from the CDC and smokefree.gov  What things can I do to make it easier to quit?  Talk to your family and friends. Ask them to support and encourage you. Call a phone quitline (1-800-QUIT-NOW), reach out to support groups, or work with a counselor. Ask people who smoke to not smoke around you. Avoid places that make you want to smoke, such as: Bars. Parties. Smoke-break areas at work. Spend time with people who do not smoke. Lower the stress in your life. Stress can make you want to   smoke. Try these things to help your stress: Getting regular exercise. Doing deep-breathing exercises. Doing yoga. Meditating. Doing a body scan. To do this, close your eyes, focus on one area of your body at a time from head to toe. Notice which parts of your body are tense. Try to relax the muscles in those areas. How will I feel when I quit smoking? Day 1 to 3 weeks Within the first 24 hours, you may start to have some problems that come from quitting tobacco.  These problems are very bad 2-3 days after you quit, but they do not often last for more than 2-3 weeks. You may get these symptoms: Mood swings. Feeling restless, nervous, angry, or annoyed. Trouble concentrating. Dizziness. Strong desire for high-sugar foods and nicotine. Weight gain. Trouble pooping (constipation). Feeling like you may vomit (nausea). Coughing or a sore throat. Changes in how the medicines that you take for other issues work in your body. Depression. Trouble sleeping (insomnia). Week 3 and afterward After the first 2-3 weeks of quitting, you may start to notice more positive results, such as: Better sense of smell and taste. Less coughing and sore throat. Slower heart rate. Lower blood pressure. Clearer skin. Better breathing. Fewer sick days. Quitting smoking can be hard. Do not give up if you fail the first time. Some people need to try a few times before they succeed. Do your best to stick to your quit plan, and talk with your doctor if you have any questions or concerns. Summary Smoking tobacco is the leading cause of preventable death. Quitting smoking can be hard, but it is one of the best things that you can do for your health. When you decide to quit smoking, make a plan to help you succeed. Quit smoking right away, not slowly over a period of time. When you start quitting, seek help from your doctor, family, or friends. This information is not intended to replace advice given to you by your health care provider. Make sure you discuss any questions you have with your health care provider. Document Revised: 05/22/2021 Document Reviewed: 12/02/2018 Elsevier Patient Education  2022 [employer]  

## 2021-10-14 NOTE — Progress Notes (Signed)
[facility] [phone]   [phone]  OFFICE PROGRESS NOTE  [place] [employer], MD [address] [place] [address]  DIAGNOSIS: Recurrent non-small cell lung cancer initially diagnosed as stage IIIA (T3, N2, M0) non-small cell lung cancer, adenosquamous carcinoma diagnosed in June 2019 and presented with large right upper lobe lung mass with questionable chest wall invasion as well as right hilar and mediastinal lymphadenopathy. He had disease progression in June 2021.  Biomarker Findings Tumor Mutational Burden - TMB-High (21 Muts/Mb) Microsatellite status - MS-Stable Genomic Findings For a complete list of the genes assayed, please refer to the Appendix. ATM Q6761* CCND2 P281R KRAS G12V CHEK2 T311f*15 TP53 E298* 7 Disease relevant genes with no reportable alterations: ALK, EGFR, BRAF, MET, RET, ERBB2, ROS1  PDL1 Expression: 99%.   PRIOR THERAPY:  1) Concurrent chemoradiation with chemotherapy consisting of weekly carboplatin for an AUC of 2 and paclitaxel 45 mg/m2.  First dose given on 04/03/2018.  Status post 6 cycles.  Last dose was giving 05/15/2018 with partial response. 2) Consolidation treatment with immunotherapy with Imfinzi (Durvalumab) 10 mg/KG every 2 weeks.  First dose 06/20/2018.  Status post 26 cycles. 3) Systemic treatment with immunotherapy with Keytruda 200 mg IV every 3 weeks.  First dose February 27, 2020. Status post 12 cycles. Discontinued due to disease progression.   CURRENT THERAPY:  Carboplatin for an AUC of 5 and Alimta 500 mg per metered squared on days 1 IV every 3 weeks.  First dose expected on 03/03/2021. Status post 9 cycle. His dose of carboplatin was reduced to an AUC of 4 and Alimta reduced to 400 mg/m2 due to intolerance. Starting from cycle #6, we will start him on maintenance Alimta 400 mg/m2.   INTERVAL HISTORY: Mark FRASIER61y.o. male returns to the clinic today for follow-up visit.  The patient is  feeling fine today with no concerning complaints except for mild fatigue.  He was admitted to the hospital few weeks ago with pericardial effusion and underwent pericardial window under the care of Dr. LKipp Broodon October 04, 2021.  The final pathology showed pericardium with mesothelial hyperplasia.  The pericardial fluid also showed reactive mesothelial cells.  The patient denied having any current chest pain but has shortness of breath with exertion with mild cough and no hemoptysis.  He lost few pounds since his last visit.  He denied having any nausea, vomiting, diarrhea or constipation.  He has no headache or visual changes.  He had CT angiogram of the chest during his hospitalization on September 30, 2021 and that showed no concerning findings for disease progression.  The patient is here today for evaluation and recommendation regarding his condition.   MEDICAL HISTORY: Past Medical History:  Diagnosis Date   Acute hepatitis C virus infection 06/19/2007   Qualifier: Diagnosis of  By: MLeilani MerlCMA, Tiffany     Acute ST elevation myocardial infarction (STEMI) involving left anterior descending (LAD) coronary artery (HCC) 05/26/2016   Acute ST elevation myocardial infarction (STEMI) of lateral wall (HCC) 05/26/2016   AICD (automatic cardioverter/defibrillator) present 03/02/2018   Anxiety    Asthma    Atherosclerosis of native arteries of the extremities with intermittent claudication 12/16/2011   Chronic hepatitis C without hepatic coma ([facility] 05/03/2014   COPD with chronic bronchitis and emphysema ([facility] 03/13/2018   FEV1 57%   Depression    DVT (deep venous thrombosis) ([facility] 2009; 2019   RLE; LLE   Encounter for antineoplastic  chemotherapy 03/22/2018   Hepatitis C    "tx'd in 2015"   High cholesterol    Ischemic cardiomyopathy 03/02/2018   Non-small cell carcinoma of right lung, stage 3 ([facility]) 03/22/2018   PAD (peripheral artery disease) ([facility]) 01/25/2013   Peripheral vascular disease, unspecified  07/20/2012   Port-A-Cath in place 04/24/2018   ST elevation myocardial infarction involving left anterior descending (LAD) coronary artery (HCC)    STEMI (ST elevation myocardial infarction) ([facility]) 05/26/2016   Tobacco abuse 01/28/2016   Tubular adenoma of colon 07/11/2014   Urinary dribbling     ALLERGIES:  is allergic to lyrica [pregabalin] and neurontin [gabapentin].  MEDICATIONS:  Current Outpatient Medications  Medication Sig Dispense Refill   albuterol (VENTOLIN HFA) 108 (90 Base) MCG/ACT inhaler Inhale 1-2 puffs into the lungs every 4 (four) hours as needed for wheezing or shortness of breath.     alprazolam (XANAX) 2 MG tablet Take 2 mg by mouth 4 (four) times daily as needed for anxiety.     amitriptyline (ELAVIL) 100 MG tablet Take 100 mg by mouth at bedtime.      aspirin 81 MG EC tablet Take 81 mg by mouth daily.     atorvastatin (LIPITOR) 80 MG tablet Take 80 mg by mouth daily.     citalopram (CELEXA) 40 MG tablet Take 40 mg by mouth daily.  11   dexamethasone (DECADRON) 4 MG tablet Please take 1 tablet twice a day the day before, the day of, and the day after treatment (Patient taking differently: Take 4 mg by mouth See admin instructions. Please take 1 tablet twice a day the day before, the day of, and the day after treatment) 40 tablet 2   folic acid (FOLVITE) 1 MG tablet Take 1 tablet (1 mg total) by mouth daily. 30 tablet 2   furosemide (LASIX) 20 MG tablet Take 1 tablet (20 mg total) by mouth daily for 4 days only. (Patient taking differently: Take 20 mg by mouth daily.) 4 tablet 0   HYDROcodone-acetaminophen (NORCO) 10-325 MG tablet Take 1 tablet by mouth every 4 (four) hours as needed for moderate pain. 20 tablet 0   levothyroxine (SYNTHROID) 125 MCG tablet Take 125 mcg by mouth daily.     lidocaine-prilocaine (EMLA) cream Apply 1 application topically as needed. (Patient taking differently: Apply 1 application topically daily as needed (port access).) 30 g 0   midodrine  (PROAMATINE) 10 MG tablet Take 1 tablet (10 mg total) by mouth 3 (three) times daily with meals. 90 tablet 1   nitroGLYCERIN (NITROSTAT) 0.4 MG SL tablet Place 1 tablet (0.4 mg total) under the tongue every 5 (five) minutes x 3 doses as needed for chest pain. 25 tablet 2   prochlorperazine (COMPAZINE) 10 MG tablet Take 1 tablet (10 mg total) by mouth every 6 (six) hours as needed for nausea or vomiting. 30 tablet 2   sucralfate (CARAFATE) 1 g tablet Take 1 tablet (1 g total) by mouth 4 (four) times daily -  with meals and at bedtime. 5 min before meals for radiation induced esophagitis (Patient taking differently: Take 1 g by mouth daily.) 120 tablet 2   tamsulosin (FLOMAX) 0.4 MG CAPS capsule Take 0.4 mg by mouth daily.     TRELEGY ELLIPTA 100-62.5-25 MCG/INH AEPB Inhale 1 puff into the lungs daily.     No current facility-administered medications for this visit.    SURGICAL HISTORY:  Past Surgical History:  Procedure Laterality Date   AORTOGRAM  08/08/2009  for LLE claudication     By Dr. 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   BIOPSY N/A 05/30/2014   Procedure: BIOPSY;  Surgeon: [name], MD;  Location: [facility];  Service: Endoscopy;  Laterality: N/A;   BLADDER TUMOR EXCISION  8/812   CARDIAC CATHETERIZATION N/A 05/26/2016   Procedure: Left Heart Cath and Coronary Angiography;  Surgeon: [name], MD;  Location: [facility];  Service: Cardiovascular;  Laterality: N/A;   CARDIAC CATHETERIZATION N/A 05/26/2016   Procedure: Coronary Stent Intervention;  Surgeon: [name], MD;  Location: [facility];  Service: Cardiovascular;  Laterality: N/A;   COLONOSCOPY WITH PROPOFOL N/A 05/30/2014   RDE:YCXKGY colonic polyp-likely source of hematochezia-removed as described above   ESOPHAGOGASTRODUODENOSCOPY (EGD) WITH PROPOFOL N/A 05/30/2014   JEH:UDJSHF and bulbar erosions s/p gastric biopsy. No evidence of portal gastropathy on today's examination.    FEMORAL-TIBIAL BYPASS GRAFT  2009   Right side using non-reversed GSV   By Dr. Anson [place] BYPASS GRAFT  11/24/2007   Right femoral to anterior tibial BPG   by Dr. Oneida Alar   FINGER SURGERY Left    "straightened my pinky"   HAND TENDON SURGERY Left 2013   Left 5th finger   HERNIA REPAIR     "stomach"   ICD IMPLANT N/A 03/02/2018   Procedure: ICD IMPLANT;  Surgeon: [name], MD;  Location: [facility];  Service: Cardiovascular;  Laterality: N/A;   INCISIONAL HERNIA REPAIR N/A 12/25/2014   Procedure: HERNIA REPAIR INCISIONAL WITH MESH;  Surgeon: [name], MD;  Location: [facility];  Service: General;  Laterality: N/A;   INSERTION OF MESH N/A 12/25/2014   Procedure: INSERTION OF MESH;  Surgeon: [name], MD;  Location: [facility];  Service: General;  Laterality: N/A;   IR IMAGING GUIDED PORT INSERTION  04/11/2018   LAPAROSCOPIC CHOLECYSTECTOMY     LOWER EXTREMITY ANGIOGRAM Left 12/30/2015   Procedure: Lower Extremity Angiogram;  Surgeon: [name], MD;  Location: [facility];  Service: Cardiovascular;  Laterality: Left;   PERIPHERAL VASCULAR CATHETERIZATION N/A 12/30/2015   Procedure: Abdominal Aortogram;  Surgeon: [name], MD;  Location: [facility];  Service: Cardiovascular;  Laterality: N/A;   POLYPECTOMY  05/30/2014   Procedure: POLYPECTOMY;  Surgeon: [name], MD;  Location: [facility];  Service: Endoscopy;;   TONSILLECTOMY AND ADENOIDECTOMY  ~ 1970   TYMPANOSTOMY TUBE PLACEMENT Bilateral ~ [place] Left 10/04/2021   Procedure: PERICARDIAL WINDOW VIDEO ASSISTED THORACOSCOPY APPROACH;  Surgeon: Lajuana Matte, MD;  Location: [facility];  Service: Thoracic;  Laterality: Left;  pericardial window.  full lateral    REVIEW OF SYSTEMS:  Constitutional: positive for fatigue and weight loss Eyes: negative Ears, nose, mouth, throat, and face: negative Respiratory: positive for cough and dyspnea on  exertion Cardiovascular: negative Gastrointestinal: negative Genitourinary:negative Integument/breast: negative Hematologic/lymphatic: negative Musculoskeletal:negative Neurological: negative Behavioral/Psych: negative Endocrine: negative Allergic/Immunologic: negative   PHYSICAL EXAMINATION: General appearance: alert, cooperative, fatigued, and no distress Head: Normocephalic, without obvious abnormality, atraumatic Neck: no adenopathy, no JVD, supple, symmetrical, trachea midline, and thyroid not enlarged, symmetric, no tenderness/mass/nodules Lymph nodes: Cervical, supraclavicular, and axillary nodes normal. Resp: clear to auscultation bilaterally Back: symmetric, no curvature. ROM normal. No CVA tenderness. Cardio: regular rate and rhythm, S1, S2 normal, no murmur, click, rub or gallop GI: soft, non-tender; bowel sounds normal; no masses,  no organomegaly  Extremities: Right below knee amputation otherwise no edema. Neurologic: Alert and oriented X 3, normal strength and tone. Normal symmetric reflexes. Normal coordination and gait  ECOG PERFORMANCE STATUS: 1 - Symptomatic but completely ambulatory  Blood pressure (!) 94/56, pulse (!) 115, temperature (!) 96.9 F (36.1 C), temperature source Tympanic, resp. rate 19, height 6' (1.829 m), weight 169 lb 14.4 oz (77.1 kg), SpO2 100 %.  LABORATORY DATA: Lab Results  Component Value Date   WBC 15.8 (H) 10/14/2021   HGB 10.8 (L) 10/14/2021   HCT 33.0 (L) 10/14/2021   MCV 95.4 10/14/2021   PLT 248 10/14/2021      Chemistry      Component Value Date/Time   NA 134 (L) 10/07/2021 0355   NA 139 02/27/2018 1457   K 3.6 10/07/2021 0355   CL 99 10/07/2021 0355   CO2 29 10/07/2021 0355   BUN 7 10/07/2021 0355   BUN 7 02/27/2018 1457   CREATININE 0.96 10/07/2021 0355   CREATININE 0.93 09/01/2021 0752   CREATININE 1.13 06/28/2016 1453      Component Value Date/Time   CALCIUM 8.1 (L) 10/07/2021 0355   ALKPHOS 87 09/30/2021 1915    AST 19 09/30/2021 1915   AST 20 09/01/2021 0752   ALT 20 09/30/2021 1915   ALT 24 09/01/2021 0752   BILITOT 0.7 09/30/2021 1915   BILITOT 0.3 09/01/2021 0752       RADIOGRAPHIC STUDIES: DG Chest 2 View  Result Date: 10/09/2021 CLINICAL DATA:  Chest tube present EXAM: CHEST - 2 VIEW COMPARISON:  10/06/2021 FINDINGS: Left apically directed chest tube remains present. Right chest port with catheter as before. Shallow inspiration with low lung volumes. Similar appearance of right hemithorax with superior retraction of the hilum and right apical opacity. Left lung remains clear. No pneumothorax. Stable cardiomediastinal contours. Left chest wall ICD. IMPRESSION: Left apically directed chest tube.  No pneumothorax. Stable lung aeration. Electronically Signed   By: Macy Mis M.D.   On: 10/09/2021 08:45   CT Angio Chest PE W and/or Wo Contrast  Result Date: 09/30/2021 CLINICAL DATA:  Intermittent sharp chest pain for the last 4 days, lung cancer EXAM: CT ANGIOGRAPHY CHEST WITH CONTRAST TECHNIQUE: Multidetector CT imaging of the chest was performed using the standard protocol during bolus administration of intravenous contrast. Multiplanar CT image reconstructions and MIPs were obtained to evaluate the vascular anatomy. CONTRAST:  64m OMNIPAQUE IOHEXOL 350 MG/ML SOLN COMPARISON:  07/23/2021 FINDINGS: Cardiovascular: This is a technically adequate evaluation of the pulmonary vasculature. No filling defects or pulmonary emboli. Large pericardial is effusion is identified, measuring up to 2.3 cm in thickness. The heart is not enlarged. Dual lead pacer is identified. No evidence of thoracic aortic aneurysm or dissection. Atherosclerosis of the aorta and coronary vasculature. Mediastinum/Nodes: No enlarged mediastinal, hilar, or axillary lymph nodes. Thyroid gland, trachea, and esophagus demonstrate no significant findings. Lungs/Pleura: Stable post therapeutic changes within the right upper lobe, with  continued cavitary lesion measuring 4.9 x 3.0 cm. Dense fibrosis and consolidation within the right upper lobe unchanged. Stable 0.8 cm left upper lobe ground-glass pulmonary nodule. Continued bilateral bronchial wall thickening, right greater than left. Trace bilateral pleural fluid. No pneumothorax. Upper Abdomen: No acute abnormality. Musculoskeletal: No acute or destructive bony lesions. Reconstructed images demonstrate no additional findings. Review of the MIP images confirms the above findings. IMPRESSION: 1. No evidence of pulmonary embolus. 2. Stable post therapeutic changes within the right upper lobe, with fibrosis, dense consolidation, and cavitation as  above. If recurrent or residual disease is suspected, PET CT could be performed. 3. Stable nonspecific 0.8 cm ground-glass left upper lobe pulmonary nodule. 4. Large pericardial effusion, increased since prior study. 5. Trace bilateral pleural fluid. 6.  Aortic Atherosclerosis (ICD10-I70.0). Electronically Signed   By: Randa Ngo M.D.   On: 09/30/2021 20:37   DG CHEST PORT 1 VIEW  Result Date: 10/06/2021 CLINICAL DATA:  Chest tube present, malignant pericardial effusion EXAM: PORTABLE CHEST 1 VIEW COMPARISON:  10/04/2021 FINDINGS: Shallow inspiration with low lung volumes. Similar appearance of the right hemithorax with superior retraction of the hilum and right apical opacity. Lungs are otherwise clear. No pleural effusion or pneumothorax identified. Stable cardiomediastinal contours. Chest tubes and right chest wall port catheter again identified. IMPRESSION: No substantial change from the prior study. Electronically Signed   By: Macy Mis M.D.   On: 10/06/2021 08:19   DG CHEST PORT 1 VIEW  Result Date: 10/04/2021 CLINICAL DATA:  Reason for exam: Chest tube, central line, port placement Post operative EXAM: PORTABLE CHEST 1 VIEW COMPARISON:  03/24/2021.  Chest CT, 09/30/2021. FINDINGS: Cardiac silhouette normal in size. Stable left  anterior chest wall AICD. No mediastinal or left hilar masses. Right hilum is retracted superiorly, mostly obscured by right upper lobe, apical opacity, stable from the prior chest radiograph. Remainder of the lungs is clear. Two chest tubes on the left, 1 curling over the left apex, the other extending over the cardiac silhouette, tip superimposed over the right heart border. No convincing pneumothorax.  No pleural effusion. Small amount of subcutaneous air is seen along the lateral lower left hemithorax. Right internal jugular Port-A-Cath and left internal jugular dual lumen central venous catheters. Both are stable from the recent CT. IMPRESSION: 1. Two left-sided chest tubes as detailed. No convincing left pneumothorax. 2. Right upper lobe/apical opacity unchanged from the recent CT, consistent with chronic scarring. Remainder of the lungs is clear. Electronically Signed   By: Lajean Manes M.D.   On: 10/04/2021 10:57   ECHOCARDIOGRAM COMPLETE  Result Date: 10/01/2021    ECHOCARDIOGRAM REPORT   Patient Name:   [name] Date of Exam: 10/01/2021 Medical Rec #:  [id number]   Height:       72.0 in Accession #:    [id number]  Weight:       171.1 lb Date of Birth:  1960/10/22   BSA:          1.994 m Patient Age:    61 years    BP:           78/57 mmHg Patient Gender: M           HR:           82 bpm. Exam Location:  Inpatient Procedure: 2D Echo, Color Doppler, Cardiac Doppler and Intracardiac            Opacification Agent Indications:    I31.3 Pericardial effusion  History:        Patient has prior history of Echocardiogram examinations, most                 recent 03/24/2021. Defibrillator, COPD; Risk                 Factors:Dyslipidemia.  Sonographer:    Raquel Sarna Senior RDCS Referring Phys: [id number] VISHAL R PATEL  Sonographer Comments: Very poor echo windows due to COPD and Lung Cancer IMPRESSIONS  1. Poor acoustic windows Definity used. LVEF is depressed. There is akinesis  of the mid/distal septum, distal anterior  and apical walls. Overall LVEF is probably 30%.  2. Right ventricular systolic function is normal. The right ventricular size is normal.  3. Large pericardial effusion surrounds heart with consolidatation. Measures 17 mm in maximal dimension. More prominent than in echo done in June 2022     Effusion is most prominent at apex and posteriorly     There is mitral inflow variation. The IVC is dilated with blunted respiratory collapse     There is no definite RV collapse, RA collapse, however poor acoustic windows limit apical views.     Concerning for hemodynamic compromise. Case reviewed with W Camnitz.  4. The mitral valve is grossly normal. No evidence of mitral valve regurgitation.  5. The aortic valve is normal in structure. Aortic valve regurgitation is not visualized. FINDINGS  Left Ventricle: Poor acoustic windows Definity used. LVEF is depressed. There is akinesis of the mid/distal septum, distal anterior and apical walls. Overall LVEF is probably 30%. Definity contrast agent was given IV to delineate the left ventricular endocardial borders. The left ventricular internal cavity size was normal in size. There is no left ventricular hypertrophy. Right Ventricle: The right ventricular size is normal. Right vetricular wall thickness was not assessed. Right ventricular systolic function is normal. Left Atrium: Left atrial size was normal in size. Right Atrium: Right atrial size was normal in size. Pericardium: Large pericardial effusion surrounds heart with consolidatation. Measures 17 mm in maximal dimension. More prominent than in echo done in June 2022 Effusion is most prominent at apex and posteriorly There is mitral inflow variation. The IVC is dilated with blunted respiratory collapse There is no definite RV collapse, RA collapse, however poor acoustic windows limit apical views. Concerning for hemodynamic compromise. Case reviewed with W Camnitz. Mitral Valve: The mitral valve is grossly normal. No evidence  of mitral valve regurgitation. Tricuspid Valve: The tricuspid valve is grossly normal. Tricuspid valve regurgitation is not demonstrated. Aortic Valve: The aortic valve is normal in structure. Aortic valve regurgitation is not visualized. Pulmonic Valve: The pulmonic valve was not well visualized. Aorta: The aortic root is normal in size and structure. IAS/Shunts: The interatrial septum was not assessed. Additional Comments: A device lead is visualized.  LEFT VENTRICLE PLAX 2D LVIDd:         4.50 cm LVIDs:         3.30 cm LV PW:         0.90 cm LV IVS:        0.80 cm LVOT diam:     1.90 cm LV SV:         38 LV SV Index:   19 LVOT Area:     2.84 cm  LV Volumes (MOD) LV vol d, MOD A2C: 112.0 ml LV vol d, MOD A4C: 118.0 ml LV vol s, MOD A2C: 74.3 ml LV vol s, MOD A4C: 48.4 ml LV SV MOD A2C:     37.7 ml LV SV MOD A4C:     118.0 ml LV SV MOD BP:      54.2 ml RIGHT VENTRICLE RV S prime:     8.81 cm/s TAPSE (M-mode): 1.1 cm LEFT ATRIUM             Index        RIGHT ATRIUM           Index LA diam:        3.20 cm 1.61 cm/m   RA Area:  13.00 cm LA Vol (A2C):   42.5 ml 21.32 ml/m  RA Volume:   29.90 ml  15.00 ml/m LA Vol (A4C):   50.3 ml 25.23 ml/m LA Biplane Vol: 49.1 ml 24.63 ml/m  AORTIC VALVE LVOT Vmax:   74.60 cm/s LVOT Vmean:  55.700 cm/s LVOT VTI:    0.133 m  AORTA Ao Root diam: 2.50 cm  SHUNTS Systemic VTI:  0.13 m Systemic Diam: 1.90 cm Dorris Carnes MD Electronically signed by Dorris Carnes MD Signature Date/Time: 10/01/2021/9:41:28 AM    Final    ECHOCARDIOGRAM LIMITED  Result Date: 10/08/2021    ECHOCARDIOGRAM LIMITED REPORT   Patient Name:   [name] Date of Exam: 10/08/2021 Medical Rec #:  [id number]   Height:       72.0 in Accession #:    [id number]  Weight:       168.7 lb Date of Birth:  [birth year]   BSA:          1.982 m Patient Age:    15 years    BP:           85/58 mmHg Patient Gender: M           HR:           82 bpm. Exam Location:  Inpatient Procedure: Limited Echo and Cardiac Doppler  Indications:    Pericardial effusion I31.3  History:        Patient has prior history of Echocardiogram examinations, most                 recent 10/01/2021. Idiopathic CMP, Previous Myocardial Infarction                 and Acute MI, COPD; Risk Factors:Current Smoker.  Sonographer:    Bernadene Person RDCS Referring Phys: [id number] [place]  1. Normal left ventricular size and wall thickness. Mid to apical anteroseptal and anterior akinesis with EF estimated 30-35%. Not all wall segments were visualized.  2. Right ventricular systolic function is normal. The right ventricular size is normal.  3. The inferior vena cava is normal in size with greater than 50% respiratory variability, suggesting right atrial pressure of 3 mmHg.  4. Trivial pericardial effusion.  5. Limited echo for pericardial effusion. Very poor acoustic windows. FINDINGS  Left Ventricle: Left ventricular ejection fraction, by estimation, is 30 to 35%. The left ventricle has moderately decreased function. The left ventricle demonstrates regional wall motion abnormalities. The left ventricular internal cavity size was normal in size. There is no left ventricular hypertrophy. Right Ventricle: The right ventricular size is normal. Right ventricular systolic function is normal. Pericardium: Trivial pericardial effusion is present. Venous: The inferior vena cava is normal in size with greater than 50% respiratory variability, suggesting right atrial pressure of 3 mmHg. [employer] Electronically signed by Franki Monte Signature Date/Time: 10/08/2021/4:44:42 PM    Final     ASSESSMENT AND PLAN: This is a very pleasant 61 years old white male recently diagnosed with a stage IIIa non-small cell lung cancer, adenosquamous carcinoma.  He underwent a course of concurrent chemoradiation with weekly carboplatin and paclitaxel status post 6 cycles with partial response.  He has no actionable mutation but PD-L1 expression is 99%. The patient  completed treatment with consolidation immunotherapy with Imfinzi status post 26 cycles.  He tolerated this treatment well with no concerning adverse effects. The patient was on observation for several months but he developed disease progression. He underwent treatment with  systemic immunotherapy with Keytruda 200 mg IV every 3 weeks status post 12 cycles.  He missed a few cycles of his treatment because of traveling to [place] to take care of his parents.   He tolerated this treatment well but unfortunately had evidence for disease progression. He started systemic chemotherapy with carboplatin for AUC of 5 and Alimta 500 Mg/M2 status post 9 cycles.  Starting from cycle #6 the patient is on single agent treatment with Alimta 400 Mg/M2 every 3 weeks.  He has been tolerating this treatment well with no concerning adverse effect except for occasional nausea and fatigue. The patient tolerated the last cycle of his treatment well.  He recently underwent pericardial window for drainage of pericardial effusion and that showed no evidence for malignancy. His last CT scan of the chest performed in early January 2023 showed no concerning findings for disease progression. I had a lengthy discussion with the patient today about his current condition and treatment options.  I gave the patient the option of continuing his treatment with maintenance Alimta every 3 weeks versus taking a break of treatment because he want to go and visit his family in [place].  The patient is interested in taking a break of the treatment after this cycle. I will see him back for follow-up visit in 3 months for evaluation and repeat CT scan of the chest, abdomen and pelvis for restaging of his disease. For the hypothyroidism, he will continue his current treatment with levothyroxine. The patient was advised to call immediately if he has any other concerning symptoms in the interval.  The patient voices understanding of current disease status  and treatment options and is in agreement with the current care plan. All questions were answered. The patient knows to call the clinic with any problems, questions or concerns. We can certainly see the patient much sooner if necessary.   Disclaimer: This note was dictated with voice recognition software. Similar sounding words can inadvertently be transcribed and may not be corrected upon review.

## 2021-10-14 NOTE — Progress Notes (Signed)
Per Dr. Julien Nordmann please adjust pts dose according to his weight.

## 2021-10-14 NOTE — Progress Notes (Signed)
Ok to proceed with elevated pulse per MD

## 2021-10-15 ENCOUNTER — Ambulatory Visit: Payer: Medicare Other | Admitting: Internal Medicine

## 2021-10-15 ENCOUNTER — Other Ambulatory Visit: Payer: Medicare Other

## 2021-10-15 ENCOUNTER — Ambulatory Visit: Payer: Medicare Other

## 2021-10-16 ENCOUNTER — Ambulatory Visit: Payer: Medicare Other | Admitting: Thoracic Surgery (Cardiothoracic Vascular Surgery)

## 2021-10-22 ENCOUNTER — Telehealth: Payer: Self-pay

## 2021-10-22 NOTE — Telephone Encounter (Signed)
Patient's wife contacted the office questioning whether paitent still had sutures/ staples at incisions/ chest tube sites. Advised that he may have one chest tube suture still in place and guided what she should be looking for. She states that he is not currently home and that she would look again as patient is insistent that he still needs sutures removed. He is s/p VATS/pericardial window with Dr. Kipp Brood 10/04/21. He did have a f/u appt with Dr. Kipp Brood on 10/16/21 which patient cancelled because of "feeling sick". Advised that patient could reschedule appointment with Dr. Kipp Brood and have sutures removed at the same time to avoid multiple appts as wife states that it is hard to get him to appts and even asked if she could remove them herself. Advised against that. She states that she will give our office a call back when her husband is back home to schedule appt if he wishes.

## 2021-10-27 DIAGNOSIS — J449 Chronic obstructive pulmonary disease, unspecified: Secondary | ICD-10-CM | POA: Diagnosis not present

## 2021-10-30 DIAGNOSIS — I10 Essential (primary) hypertension: Secondary | ICD-10-CM | POA: Diagnosis not present

## 2021-10-30 DIAGNOSIS — Z6823 Body mass index (BMI) 23.0-23.9, adult: Secondary | ICD-10-CM | POA: Diagnosis not present

## 2021-10-30 DIAGNOSIS — C3490 Malignant neoplasm of unspecified part of unspecified bronchus or lung: Secondary | ICD-10-CM | POA: Diagnosis not present

## 2021-10-30 DIAGNOSIS — G894 Chronic pain syndrome: Secondary | ICD-10-CM | POA: Diagnosis not present

## 2021-10-30 DIAGNOSIS — J449 Chronic obstructive pulmonary disease, unspecified: Secondary | ICD-10-CM | POA: Diagnosis not present

## 2021-10-30 DIAGNOSIS — E063 Autoimmune thyroiditis: Secondary | ICD-10-CM | POA: Diagnosis not present

## 2021-11-02 ENCOUNTER — Other Ambulatory Visit: Payer: Medicare Other

## 2021-11-02 ENCOUNTER — Ambulatory Visit: Payer: Medicare Other | Admitting: Physician Assistant

## 2021-11-02 ENCOUNTER — Ambulatory Visit: Payer: Medicare Other

## 2021-11-09 DIAGNOSIS — M6281 Muscle weakness (generalized): Secondary | ICD-10-CM | POA: Diagnosis not present

## 2021-11-09 DIAGNOSIS — J449 Chronic obstructive pulmonary disease, unspecified: Secondary | ICD-10-CM | POA: Diagnosis not present

## 2021-11-09 DIAGNOSIS — R918 Other nonspecific abnormal finding of lung field: Secondary | ICD-10-CM | POA: Diagnosis not present

## 2021-11-09 DIAGNOSIS — I70219 Atherosclerosis of native arteries of extremities with intermittent claudication, unspecified extremity: Secondary | ICD-10-CM | POA: Diagnosis not present

## 2021-11-23 ENCOUNTER — Other Ambulatory Visit: Payer: Medicare Other

## 2021-11-23 ENCOUNTER — Ambulatory Visit: Payer: Medicare Other | Admitting: Internal Medicine

## 2021-11-23 ENCOUNTER — Ambulatory Visit: Payer: Medicare Other

## 2021-11-30 DIAGNOSIS — C3490 Malignant neoplasm of unspecified part of unspecified bronchus or lung: Secondary | ICD-10-CM | POA: Diagnosis not present

## 2021-11-30 DIAGNOSIS — E063 Autoimmune thyroiditis: Secondary | ICD-10-CM | POA: Diagnosis not present

## 2021-11-30 DIAGNOSIS — I10 Essential (primary) hypertension: Secondary | ICD-10-CM | POA: Diagnosis not present

## 2021-11-30 DIAGNOSIS — G894 Chronic pain syndrome: Secondary | ICD-10-CM | POA: Diagnosis not present

## 2021-12-02 ENCOUNTER — Ambulatory Visit (INDEPENDENT_AMBULATORY_CARE_PROVIDER_SITE_OTHER): Payer: Medicare Other

## 2021-12-02 DIAGNOSIS — I255 Ischemic cardiomyopathy: Secondary | ICD-10-CM | POA: Diagnosis not present

## 2021-12-02 LAB — CUP PACEART REMOTE DEVICE CHECK
Battery Remaining Longevity: 104 mo
Battery Voltage: 2.97 V
Brady Statistic RV Percent Paced: 0 %
Date Time Interrogation Session: 20230308042505
HighPow Impedance: 63 Ohm
Implantable Lead Implant Date: 20190606
Implantable Lead Location: 753860
Implantable Pulse Generator Implant Date: 20190606
Lead Channel Impedance Value: 247 Ohm
Lead Channel Impedance Value: 342 Ohm
Lead Channel Pacing Threshold Amplitude: 0.875 V
Lead Channel Pacing Threshold Pulse Width: 0.4 ms
Lead Channel Sensing Intrinsic Amplitude: 14.125 mV
Lead Channel Sensing Intrinsic Amplitude: 14.125 mV
Lead Channel Setting Pacing Amplitude: 2.5 V
Lead Channel Setting Pacing Pulse Width: 0.4 ms
Lead Channel Setting Sensing Sensitivity: 0.3 mV

## 2021-12-07 DIAGNOSIS — R918 Other nonspecific abnormal finding of lung field: Secondary | ICD-10-CM | POA: Diagnosis not present

## 2021-12-07 DIAGNOSIS — I70219 Atherosclerosis of native arteries of extremities with intermittent claudication, unspecified extremity: Secondary | ICD-10-CM | POA: Diagnosis not present

## 2021-12-07 DIAGNOSIS — M6281 Muscle weakness (generalized): Secondary | ICD-10-CM | POA: Diagnosis not present

## 2021-12-07 DIAGNOSIS — J449 Chronic obstructive pulmonary disease, unspecified: Secondary | ICD-10-CM | POA: Diagnosis not present

## 2021-12-15 ENCOUNTER — Telehealth: Payer: Self-pay

## 2021-12-15 NOTE — Telephone Encounter (Signed)
Pt wife left a message wanting to know when his next appt is and how to schedule this CT scan. ? ?I have called her back and left a detailed message with the Radiology Scheduling number. I also advised that the contact number and contrast instructions were given to him and is located on a bring yellow piece of paper. If she has additional questions/concerns, she was invited to call back. ?

## 2021-12-15 NOTE — Progress Notes (Signed)
Remote ICD transmission.   

## 2021-12-16 ENCOUNTER — Telehealth: Payer: Self-pay | Admitting: Internal Medicine

## 2021-12-16 NOTE — Telephone Encounter (Signed)
.  Called pt per 3/21 inbasket , Patient was unavailable, a message with appt time and date was left with number on file.   ?

## 2021-12-25 DIAGNOSIS — J449 Chronic obstructive pulmonary disease, unspecified: Secondary | ICD-10-CM | POA: Diagnosis not present

## 2021-12-25 DIAGNOSIS — I10 Essential (primary) hypertension: Secondary | ICD-10-CM | POA: Diagnosis not present

## 2021-12-25 DIAGNOSIS — E782 Mixed hyperlipidemia: Secondary | ICD-10-CM | POA: Diagnosis not present

## 2021-12-29 ENCOUNTER — Telehealth: Payer: Self-pay | Admitting: Internal Medicine

## 2021-12-29 NOTE — Telephone Encounter (Signed)
Called patient regarding upcoming appointment, left a voicemail. 

## 2021-12-30 DIAGNOSIS — G546 Phantom limb syndrome with pain: Secondary | ICD-10-CM | POA: Diagnosis not present

## 2021-12-30 DIAGNOSIS — G894 Chronic pain syndrome: Secondary | ICD-10-CM | POA: Diagnosis not present

## 2022-01-07 DIAGNOSIS — I70219 Atherosclerosis of native arteries of extremities with intermittent claudication, unspecified extremity: Secondary | ICD-10-CM | POA: Diagnosis not present

## 2022-01-07 DIAGNOSIS — M6281 Muscle weakness (generalized): Secondary | ICD-10-CM | POA: Diagnosis not present

## 2022-01-07 DIAGNOSIS — J449 Chronic obstructive pulmonary disease, unspecified: Secondary | ICD-10-CM | POA: Diagnosis not present

## 2022-01-07 DIAGNOSIS — R918 Other nonspecific abnormal finding of lung field: Secondary | ICD-10-CM | POA: Diagnosis not present

## 2022-01-08 ENCOUNTER — Ambulatory Visit (HOSPITAL_COMMUNITY)
Admission: RE | Admit: 2022-01-08 | Discharge: 2022-01-08 | Disposition: A | Discharge status: Home / Self Care | Payer: Medicare Other | Source: Ambulatory Visit | Attending: Internal Medicine | Admitting: Internal Medicine

## 2022-01-08 ENCOUNTER — Other Ambulatory Visit: Payer: Self-pay

## 2022-01-08 ENCOUNTER — Encounter (HOSPITAL_COMMUNITY): Payer: Self-pay

## 2022-01-08 ENCOUNTER — Inpatient Hospital Stay: Payer: Medicare Other | Attending: Physician Assistant

## 2022-01-08 DIAGNOSIS — C3491 Malignant neoplasm of unspecified part of right bronchus or lung: Secondary | ICD-10-CM

## 2022-01-08 DIAGNOSIS — Z0389 Encounter for observation for other suspected diseases and conditions ruled out: Secondary | ICD-10-CM | POA: Diagnosis not present

## 2022-01-08 DIAGNOSIS — Z7982 Long term (current) use of aspirin: Secondary | ICD-10-CM | POA: Diagnosis not present

## 2022-01-08 DIAGNOSIS — C3490 Malignant neoplasm of unspecified part of unspecified bronchus or lung: Secondary | ICD-10-CM | POA: Diagnosis not present

## 2022-01-08 DIAGNOSIS — C3411 Malignant neoplasm of upper lobe, right bronchus or lung: Secondary | ICD-10-CM | POA: Insufficient documentation

## 2022-01-08 DIAGNOSIS — Z5111 Encounter for antineoplastic chemotherapy: Secondary | ICD-10-CM | POA: Insufficient documentation

## 2022-01-08 DIAGNOSIS — Z95828 Presence of other vascular implants and grafts: Secondary | ICD-10-CM

## 2022-01-08 DIAGNOSIS — J9811 Atelectasis: Secondary | ICD-10-CM | POA: Diagnosis not present

## 2022-01-08 DIAGNOSIS — J90 Pleural effusion, not elsewhere classified: Secondary | ICD-10-CM | POA: Diagnosis not present

## 2022-01-08 DIAGNOSIS — Z79899 Other long term (current) drug therapy: Secondary | ICD-10-CM | POA: Diagnosis not present

## 2022-01-08 DIAGNOSIS — I700 Atherosclerosis of aorta: Secondary | ICD-10-CM | POA: Diagnosis not present

## 2022-01-08 LAB — CBC WITH DIFFERENTIAL (CANCER CENTER ONLY)
Abs Immature Granulocytes: 0.03 10*3/uL (ref 0.00–0.07)
Basophils Absolute: 0.1 10*3/uL (ref 0.0–0.1)
Basophils Relative: 1 %
Eosinophils Absolute: 0.3 10*3/uL (ref 0.0–0.5)
Eosinophils Relative: 4 %
HCT: 39.4 % (ref 39.0–52.0)
Hemoglobin: 12.6 g/dL — ABNORMAL LOW (ref 13.0–17.0)
Immature Granulocytes: 0 %
Lymphocytes Relative: 12 %
Lymphs Abs: 0.8 10*3/uL (ref 0.7–4.0)
MCH: 29 pg (ref 26.0–34.0)
MCHC: 32 g/dL (ref 30.0–36.0)
MCV: 90.6 fL (ref 80.0–100.0)
Monocytes Absolute: 0.5 10*3/uL (ref 0.1–1.0)
Monocytes Relative: 8 %
Neutro Abs: 5.2 10*3/uL (ref 1.7–7.7)
Neutrophils Relative %: 75 %
Platelet Count: 250 10*3/uL (ref 150–400)
RBC: 4.35 MIL/uL (ref 4.22–5.81)
RDW: 15.2 % (ref 11.5–15.5)
WBC Count: 6.9 10*3/uL (ref 4.0–10.5)
nRBC: 0 % (ref 0.0–0.2)

## 2022-01-08 LAB — CMP (CANCER CENTER ONLY)
ALT: 21 U/L (ref 0–44)
AST: 24 U/L (ref 15–41)
Albumin: 3.7 g/dL (ref 3.5–5.0)
Alkaline Phosphatase: 110 U/L (ref 38–126)
Anion gap: 6 (ref 5–15)
BUN: 8 mg/dL (ref 6–20)
CO2: 33 mmol/L — ABNORMAL HIGH (ref 22–32)
Calcium: 8.9 mg/dL (ref 8.9–10.3)
Chloride: 96 mmol/L — ABNORMAL LOW (ref 98–111)
Creatinine: 0.82 mg/dL (ref 0.61–1.24)
GFR, Estimated: >60 mL/min (ref >60)
Glucose, Bld: 102 mg/dL — ABNORMAL HIGH (ref 70–99)
Potassium: 4.1 mmol/L (ref 3.5–5.1)
Sodium: 135 mmol/L (ref 135–145)
Total Bilirubin: 0.3 mg/dL (ref 0.3–1.2)
Total Protein: 6.8 g/dL (ref 6.5–8.1)

## 2022-01-08 MED ORDER — HEPARIN SOD (PORK) LOCK FLUSH 100 UNIT/ML IV SOLN
500.0000 [IU] | Freq: Once | INTRAVENOUS | Status: AC
  Administered 2022-01-08: 500 [IU] via INTRAVENOUS

## 2022-01-08 MED ORDER — SODIUM CHLORIDE (PF) 0.9 % IJ SOLN
INTRAMUSCULAR | Status: AC
  Filled 2022-01-08: qty 50

## 2022-01-08 MED ORDER — IOHEXOL 300 MG/ML  SOLN
100.0000 mL | Freq: Once | INTRAMUSCULAR | Status: AC | PRN
  Administered 2022-01-08: 100 mL via INTRAVENOUS

## 2022-01-08 MED ORDER — SODIUM CHLORIDE 0.9% FLUSH
10.0000 mL | Freq: Once | INTRAVENOUS | Status: AC
  Administered 2022-01-08: 10 mL

## 2022-01-08 MED ORDER — HEPARIN SOD (PORK) LOCK FLUSH 100 UNIT/ML IV SOLN
INTRAVENOUS | Status: AC
  Filled 2022-01-08: qty 5

## 2022-01-12 ENCOUNTER — Inpatient Hospital Stay (HOSPITAL_BASED_OUTPATIENT_CLINIC_OR_DEPARTMENT_OTHER): Payer: Medicare Other | Admitting: Internal Medicine

## 2022-01-12 ENCOUNTER — Other Ambulatory Visit: Payer: Self-pay

## 2022-01-12 VITALS — BP 81/64 | HR 88 | Temp 98.3°F | Resp 19 | Ht 72.0 in | Wt 179.5 lb

## 2022-01-12 DIAGNOSIS — Z7982 Long term (current) use of aspirin: Secondary | ICD-10-CM | POA: Diagnosis not present

## 2022-01-12 DIAGNOSIS — C3490 Malignant neoplasm of unspecified part of unspecified bronchus or lung: Secondary | ICD-10-CM

## 2022-01-12 DIAGNOSIS — C3411 Malignant neoplasm of upper lobe, right bronchus or lung: Secondary | ICD-10-CM | POA: Diagnosis not present

## 2022-01-12 DIAGNOSIS — Z79899 Other long term (current) drug therapy: Secondary | ICD-10-CM | POA: Diagnosis not present

## 2022-01-12 DIAGNOSIS — Z5111 Encounter for antineoplastic chemotherapy: Secondary | ICD-10-CM | POA: Diagnosis not present

## 2022-01-12 NOTE — Progress Notes (Signed)
?    [facility] ?[phone]   [phone] ? ?OFFICE PROGRESS NOTE ? ?Redmond School, MD ?[address] ?[place] [address] ? ?DIAGNOSIS: Recurrent non-small cell lung cancer initially diagnosed as stage IIIA (T3, N2, M0) non-small cell lung cancer, adenosquamous carcinoma diagnosed in June 2019 and presented with large right upper lobe lung mass with questionable chest wall invasion as well as right hilar and mediastinal lymphadenopathy. ?He had disease progression in June 2021. ? ?Biomarker Findings ?Tumor Mutational Burden - TMB-High (21 Muts/Mb) ?Microsatellite status - MS-Stable ?Genomic Findings ?For a complete list of the genes assayed, please refer to the Appendix. ?ATM [phone]* ?CCND2 P281R ?KRAS G12V ?CHEK2 T320f*15 ?TP53 E298* ?7 Disease relevant genes with no reportable alterations: ALK, EGFR, ?BRAF, MET, RET, ERBB2, ROS1 ? ?PDL1 Expression: 99%. ?  ?PRIOR THERAPY:  ?1) Concurrent chemoradiation with chemotherapy consisting of weekly carboplatin for an AUC of 2 and paclitaxel 45 mg/m2.  First dose given on 04/03/2018.  Status post 6 cycles.  Last dose was giving 05/15/2018 with partial response. ?2) Consolidation treatment with immunotherapy with Imfinzi (Durvalumab) 10 mg/KG every 2 weeks.  First dose 06/20/2018.  Status post 26 cycles. ?3) Systemic treatment with immunotherapy with Keytruda 200 mg IV every 3 weeks.  First dose February 27, 2020. Status post 12 cycles. Discontinued due to disease progression. ?  ?CURRENT THERAPY:  Carboplatin for an AUC of 5 and Alimta 500 mg per metered squared on days 1 IV every 3 weeks.  First dose expected on 03/03/2021. Status post 10 cycle. His dose of carboplatin was reduced to an AUC of 4 and Alimta reduced to 400 mg/m2 due to intolerance. Starting from cycle #6, we will start him on maintenance Alimta 400 mg/m2.  His treatment is currently on hold secondary to toxicity. ? ?INTERVAL HISTORY: ?Mark DEVEREUX89y.o. male returns to  the clinic today for 353-monthollow-up visit accompanied by his wife.  The patient is feeling fine today with no concerning complaints except for mild fatigue.  He has no nausea, vomiting, diarrhea or constipation.  He has no chest pain, shortness of breath, cough or hemoptysis.  He has no headache or visual changes.  He is here today for evaluation with repeat CT scan of the chest, abdomen and pelvis for restaging of his disease. ? ?MEDICAL HISTORY: ?Past Medical History:  ?Diagnosis Date  ? Acute hepatitis C virus infection 06/19/2007  ? Qualifier: Diagnosis of  By: McLeilani MerlMA, Tiffany    ? Acute ST elevation myocardial infarction (STEMI) involving left anterior descending (LAD) coronary artery ([facility]/30/2017  ? Acute ST elevation myocardial infarction (STEMI) of lateral wall ([facility]8/30/2017  ? AICD (automatic cardioverter/defibrillator) present 03/02/2018  ? Anxiety   ? Asthma   ? Atherosclerosis of native arteries of the extremities with intermittent claudication 12/16/2011  ? Chronic hepatitis C without hepatic coma ([facility]8/03/2014  ? COPD with chronic bronchitis and emphysema ([facility]6/17/2019  ? FEV1 57%  ? Depression   ? DVT (deep venous thrombosis) (HCCasa2009; 2019  ? RLE; LLE  ? Encounter for antineoplastic chemotherapy 03/22/2018  ? Hepatitis C   ? "tx'd in 2015"  ? High cholesterol   ? Ischemic cardiomyopathy 03/02/2018  ? Non-small cell carcinoma of right lung, stage 3 ([facility]6/26/2019  ? PAD (peripheral artery disease) ([facility]5/09/2012  ? Peripheral vascular disease, unspecified 07/20/2012  ? Port-A-Cath in place 04/24/2018  ? ST elevation myocardial infarction involving left anterior descending (LAD) coronary  artery ([facility])   ? STEMI (ST elevation myocardial infarction) ([place]) 05/26/2016  ? Tobacco abuse 01/28/2016  ? Tubular adenoma of colon 07/11/2014  ? Urinary dribbling   ? ? ?ALLERGIES:  is allergic to lyrica [pregabalin] and neurontin [gabapentin]. ? ?MEDICATIONS:  ?Current Outpatient Medications  ?Medication Sig  Dispense Refill  ? albuterol (VENTOLIN HFA) 108 (90 Base) MCG/ACT inhaler Inhale 1-2 puffs into the lungs every 4 (four) hours as needed for wheezing or shortness of breath.    ? alprazolam (XANAX) 2 MG tablet Take 2 mg by mouth 4 (four) times daily as needed for anxiety.    ? amitriptyline (ELAVIL) 100 MG tablet Take 100 mg by mouth at bedtime.     ? aspirin 81 MG EC tablet Take 81 mg by mouth daily.    ? atorvastatin (LIPITOR) 80 MG tablet Take 80 mg by mouth daily.    ? citalopram (CELEXA) 40 MG tablet Take 40 mg by mouth daily.  11  ? dexamethasone (DECADRON) 4 MG tablet Please take 1 tablet twice a day the day before, the day of, and the day after treatment (Patient taking differently: Take 4 mg by mouth See admin instructions. Please take 1 tablet twice a day the day before, the day of, and the day after treatment) 40 tablet 2  ? folic acid (FOLVITE) 1 MG tablet Take 1 tablet (1 mg total) by mouth daily. 30 tablet 2  ? HYDROcodone-acetaminophen (NORCO) 10-325 MG tablet Take 1 tablet by mouth every 4 (four) hours as needed for moderate pain. 20 tablet 0  ? levothyroxine (SYNTHROID) 125 MCG tablet Take 125 mcg by mouth daily.    ? lidocaine-prilocaine (EMLA) cream Apply 1 application topically as needed. (Patient taking differently: Apply 1 application topically daily as needed (port access).) 30 g 0  ? midodrine (PROAMATINE) 10 MG tablet Take 1 tablet (10 mg total) by mouth 3 (three) times daily with meals. 90 tablet 1  ? nitroGLYCERIN (NITROSTAT) 0.4 MG SL tablet Place 1 tablet (0.4 mg total) under the tongue every 5 (five) minutes x 3 doses as needed for chest pain. (Patient not taking: Reported on 10/14/2021) 25 tablet 2  ? prochlorperazine (COMPAZINE) 10 MG tablet Take 1 tablet (10 mg total) by mouth every 6 (six) hours as needed for nausea or vomiting. (Patient not taking: Reported on 10/14/2021) 30 tablet 2  ? sucralfate (CARAFATE) 1 g tablet Take 1 tablet (1 g total) by mouth 4 (four) times daily -  with  meals and at bedtime. 5 min before meals for radiation induced esophagitis (Patient taking differently: Take 1 g by mouth daily.) 120 tablet 2  ? tamsulosin (FLOMAX) 0.4 MG CAPS capsule Take 0.4 mg by mouth daily.    ? TRELEGY ELLIPTA 100-62.5-25 MCG/INH AEPB Inhale 1 puff into the lungs daily.    ? ?No current facility-administered medications for this visit.  ? ? ?SURGICAL HISTORY:  ?Past Surgical History:  ?Procedure Laterality Date  ? AORTOGRAM  08/08/2009  ? for LLE claudication     By Dr. Oneida Alar  ? 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 BIOPSY N/A 05/30/2014  ? Procedure: BIOPSY;  Surgeon: [name], MD;  [facility];  Service: Endoscopy;  Laterality: N/A;  ? BLADDER TUMOR EXCISION  8/812  ? CARDIAC CATHETERIZATION N/A 05/26/2016  ? Procedure: Left Heart Cath and Coronary Angiography;  Surgeon: [name], MD;  Location: [facility];  Service: Cardiovascular;  Laterality: N/A;  ? CARDIAC  CATHETERIZATION N/A 05/26/2016  ? Procedure: Coronary Stent Intervention;  Surgeon: [name], MD;  Location: [facility];  Service: Cardiovascular;  Laterality: N/A;  ? COLONOSCOPY WITH PROPOFOL N/A 05/30/2014  ? TDS:KAJGOT colonic polyp-likely source of hematochezia-removed as described above  ? ESOPHAGOGASTRODUODENOSCOPY (EGD) WITH PROPOFOL N/A 05/30/2014  ? LXB:WIOMBT and bulbar erosions s/p gastric biopsy. No evidence of portal gastropathy on today's examination.  ? FEMORAL-TIBIAL BYPASS GRAFT  2009  ? Right side using non-reversed GSV   By Dr. Oneida Alar  ? FEMORAL-TIBIAL BYPASS GRAFT  11/24/2007  ? Right femoral to anterior tibial BPG   by Dr. Oneida Alar  ? FINGER SURGERY Left   ? "straightened my pinky"  ? HAND TENDON SURGERY Left 2013  ? Left 5th finger  ? HERNIA REPAIR    ? "stomach"  ? ICD IMPLANT N/A 03/02/2018  ? Procedure: ICD IMPLANT;  Surgeon: [name], MD;  Location: [facility];  Service: Cardiovascular;  Laterality: N/A;  ? INCISIONAL HERNIA REPAIR N/A 12/25/2014  ?  Procedure: HERNIA REPAIR INCISIONAL WITH MESH;  Surgeon: [name], MD;  Location: [facility];  Service: General;  Laterality: N/A;  ? INSERTION OF MESH N/A 12/25/2014  ? Procedure: INSERTION OF MESH;

## 2022-01-14 NOTE — Progress Notes (Deleted)
Cardiology Office Note Date:  01/14/2022  Patient ID:  [name], Dec Jan 30, 1961, MRN [id number] PCP:  Redmond School, MD  Cardiologist:  Dr. Oval Linsey Electrophysiologist: Dr. Curt Bears  ***refresh   Chief Complaint: *** annual visit  History of Present Illness: [name] is a 61 y.o. male with history of CAD (STEMI 2017 with 100% occlusion of the LAD treated with DES), ICM, ICD, PAD with right BKA, ongoing tobacco use, hepatitis C, seizure disorder, and lung cancer (stage 3, R lung), COPD  He comes in today to be seen for Dr. Curt Bears, last seen by him  More recently hospitalized 09/30/21 - 10/09/21 with pleuritic chest pain and underwent CTA chest which was negative for PE but showed interval increase in a pericardial effusion, read as large, compared to CT 06/2020, cardiology consulted Hypotensive though is at baseline on midodrine, not tachycardic, Echocardiogram shows ejection fraction 30%, large pericardial effusion increased in size compared to June 2022, mitral inflow variation, IVC dilated with blunted respiratory collapse suggestive of tamponade physiology, transferred to [facility], underwent pericardial window  No cardiac f/u since  *** symptoms *** volume *** gen cards   Device information MDT single chamber ICD implanted 03/02/2018   Past Medical History:  Diagnosis Date   Acute hepatitis C virus infection 06/19/2007   Qualifier: Diagnosis of  By: Leilani Merl CMA, Tiffany     Acute ST elevation myocardial infarction (STEMI) involving left anterior descending (LAD) coronary artery ([facility]) 05/26/2016   Acute ST elevation myocardial infarction (STEMI) of lateral wall ([facility]) 05/26/2016   AICD (automatic cardioverter/defibrillator) present 03/02/2018   Anxiety    Asthma    Atherosclerosis of native arteries of the extremities with intermittent claudication 12/16/2011   Chronic hepatitis C without hepatic coma ([facility]) 05/03/2014   COPD with chronic bronchitis and emphysema ([facility]) 03/13/2018    FEV1 57%   Depression    DVT (deep venous thrombosis) ([place]) 2009; 2019   RLE; LLE   Encounter for antineoplastic chemotherapy 03/22/2018   Hepatitis C    "tx'd in 2015"   High cholesterol    Ischemic cardiomyopathy 03/02/2018   Non-small cell carcinoma of right lung, stage 3 ([facility]) 03/22/2018   PAD (peripheral artery disease) ([facility]) 01/25/2013   Peripheral vascular disease, unspecified 07/20/2012   Port-A-Cath in place 04/24/2018   ST elevation myocardial infarction involving left anterior descending (LAD) coronary artery ([facility])    STEMI (ST elevation myocardial infarction) ([place]) 05/26/2016   Tobacco abuse 01/28/2016   Tubular adenoma of colon 07/11/2014   Urinary dribbling     Past Surgical History:  Procedure Laterality Date   AORTOGRAM  08/08/2009   for LLE claudication     By Dr. 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   BIOPSY N/A 05/30/2014   Procedure: BIOPSY;  Surgeon: [name], MD;  [facility];  Service: Endoscopy;  Laterality: N/A;   BLADDER TUMOR EXCISION  8/812   CARDIAC CATHETERIZATION N/A 05/26/2016   Procedure: Left Heart Cath and Coronary Angiography;  Surgeon: [name], MD;  Location: [facility];  Service: Cardiovascular;  Laterality: N/A;   CARDIAC CATHETERIZATION N/A 05/26/2016   Procedure: Coronary Stent Intervention;  Surgeon: [name], MD;  Location: [facility];  Service: Cardiovascular;  Laterality: N/A;   COLONOSCOPY WITH PROPOFOL N/A 05/30/2014   WUJ:WJXBJY colonic polyp-likely source of hematochezia-removed as described above   ESOPHAGOGASTRODUODENOSCOPY (EGD) WITH PROPOFOL N/A 05/30/2014   NWG:NFAOZH and bulbar erosions s/p gastric  biopsy. No evidence of portal gastropathy on today's examination.   FEMORAL-TIBIAL BYPASS GRAFT  2009   Right side using non-reversed GSV   By Dr. Anson [place] BYPASS GRAFT  11/24/2007   Right femoral to anterior tibial BPG   by Dr. Oneida Alar   FINGER SURGERY Left    "straightened my  pinky"   HAND TENDON SURGERY Left 2013   Left 5th finger   HERNIA REPAIR     "stomach"   ICD IMPLANT N/A 03/02/2018   Procedure: ICD IMPLANT;  Surgeon: [name], MD;  Location: [facility];  Service: Cardiovascular;  Laterality: N/A;   INCISIONAL HERNIA REPAIR N/A 12/25/2014   Procedure: HERNIA REPAIR INCISIONAL WITH MESH;  Surgeon: [name], MD;  Location[facility];  Service: General;  Laterality: N/A;   INSERTION OF MESH N/A 12/25/2014   Procedure: INSERTION OF MESH;  Surgeon: [name], MD;  Location[facility];  Service: General;  Laterality: N/A;   IR IMAGING GUIDED PORT INSERTION  04/11/2018   LAPAROSCOPIC CHOLECYSTECTOMY     LOWER EXTREMITY ANGIOGRAM Left 12/30/2015   Procedure: Lower Extremity Angiogram;  Surgeon: [name], MD;  Location: [facility];  Service: Cardiovascular;  Laterality: Left;   PERIPHERAL VASCULAR CATHETERIZATION N/A 12/30/2015   Procedure: Abdominal Aortogram;  Surgeon: [name], MD;  Location: [facility];  Service: Cardiovascular;  Laterality: N/A;   POLYPECTOMY  05/30/2014   Procedure: POLYPECTOMY;  Surgeon: [name], MD;  Location[facility];  Service: Endoscopy;;   TONSILLECTOMY AND ADENOIDECTOMY  ~ 1970   TYMPANOSTOMY TUBE PLACEMENT Bilateral ~ [place] Left 10/04/2021   Procedure: PERICARDIAL WINDOW VIDEO ASSISTED THORACOSCOPY APPROACH;  Surgeon: Lajuana Matte, MD;  Location: [facility];  Service: Thoracic;  Laterality: Left;  pericardial window.  full lateral    Current Outpatient Medications  Medication Sig Dispense Refill   albuterol (VENTOLIN HFA) 108 (90 Base) MCG/ACT inhaler Inhale 1-2 puffs into the lungs every 4 (four) hours as needed for wheezing or shortness of breath.     alprazolam (XANAX) 2 MG tablet Take 2 mg by mouth 4 (four) times daily as needed for anxiety.     amitriptyline (ELAVIL) 100 MG tablet Take 100 mg by mouth at bedtime.      aspirin 81 MG EC tablet  Take 81 mg by mouth daily.     atorvastatin (LIPITOR) 80 MG tablet Take 80 mg by mouth daily.     citalopram (CELEXA) 40 MG tablet Take 40 mg by mouth daily.  11   dexamethasone (DECADRON) 4 MG tablet Please take 1 tablet twice a day the day before, the day of, and the day after treatment (Patient taking differently: Take 4 mg by mouth See admin instructions. Please take 1 tablet twice a day the day before, the day of, and the day after treatment) 40 tablet 2   folic acid (FOLVITE) 1 MG tablet Take 1 tablet (1 mg total) by mouth daily. 30 tablet 2   HYDROcodone-acetaminophen (NORCO) 10-325 MG tablet Take 1 tablet by mouth every 4 (four) hours as needed for moderate pain. 20 tablet 0   levothyroxine (SYNTHROID) 125 MCG tablet Take 125 mcg by mouth daily.     lidocaine-prilocaine (EMLA) cream Apply 1 application topically as needed. (Patient taking differently: Apply 1 application topically daily as needed (port access).) 30 g 0   midodrine (PROAMATINE) 10 MG tablet Take 1 tablet (  10 mg total) by mouth 3 (three) times daily with meals. 90 tablet 1   nitroGLYCERIN (NITROSTAT) 0.4 MG SL tablet Place 1 tablet (0.4 mg total) under the tongue every 5 (five) minutes x 3 doses as needed for chest pain. (Patient not taking: Reported on 10/14/2021) 25 tablet 2   prochlorperazine (COMPAZINE) 10 MG tablet Take 1 tablet (10 mg total) by mouth every 6 (six) hours as needed for nausea or vomiting. (Patient not taking: Reported on 10/14/2021) 30 tablet 2   sucralfate (CARAFATE) 1 g tablet Take 1 tablet (1 g total) by mouth 4 (four) times daily -  with meals and at bedtime. 5 min before meals for radiation induced esophagitis (Patient taking differently: Take 1 g by mouth daily.) 120 tablet 2   tamsulosin (FLOMAX) 0.4 MG CAPS capsule Take 0.4 mg by mouth daily.     TRELEGY ELLIPTA 100-62.5-25 MCG/INH AEPB Inhale 1 puff into the lungs daily.     No current facility-administered medications for this visit.    Allergies:    Lyrica [pregabalin] and Neurontin [gabapentin]   Social History:  The patient  reports that he has been smoking cigarettes. He has never used smokeless tobacco. He reports that he does not currently use alcohol after a past usage of about 18.0 standard drinks per week. He reports current drug use. Drug: Marijuana.   Family History:  The patient's family history includes Ulcers in his father; Vision loss in his mother.  ROS:  Please see the history of present illness.    All other systems are reviewed and otherwise negative.   PHYSICAL EXAM:  VS:  There were no vitals taken for this visit. BMI: There is no height or weight on file to calculate BMI. Well nourished, well developed, in no acute distress HEENT: normocephalic, atraumatic Neck: no JVD, carotid bruits or masses Cardiac:  *** RRR; no significant murmurs, no rubs, or gallops Lungs:  *** CTA b/l, no wheezing, rhonchi or rales Abd: soft, nontender MS: no deformity or *** atrophy Ext: *** no edema Skin: warm and dry, no rash Neuro:  No gross deficits appreciated Psych: euthymic mood, full affect  *** ICD site is stable, no tethering or discomfort   EKG:  not done today  Device interrogation done today and reviewed by myself:  ***  10/08/21: limited TTE IMPRESSIONS   1. Normal left ventricular size and wall thickness. Mid to apical  anteroseptal and anterior akinesis with EF estimated 30-35%. Not all wall  segments were visualized.   2. Right ventricular systolic function is normal. The right ventricular  size is normal.   3. The inferior vena cava is normal in size with greater than 50%  respiratory variability, suggesting right atrial pressure of 3 mmHg.   4. Trivial pericardial effusion.   5. Limited echo for pericardial effusion. Very poor acoustic windows.    10/01/2021: TTE  1. Poor acoustic windows Definity used. LVEF is depressed. There is  akinesis of the mid/distal septum, distal anterior and apical walls.   Overall LVEF is probably 30%.   2. Right ventricular systolic function is normal. The right ventricular  size is normal.   3. Large pericardial effusion surrounds heart with consolidatation.  Measures 17 mm in maximal dimension. More prominent than in echo done in  June 2022      Effusion is most prominent at apex and posteriorly      There is mitral inflow variation. The IVC is dilated with blunted  respiratory collapse  There is no definite RV collapse, RA collapse, however poor acoustic  windows limit apical views.      Concerning for hemodynamic compromise. Case reviewed with W [place].   4. The mitral valve is grossly normal. No evidence of mitral valve  regurgitation.   5. The aortic valve is normal in structure. Aortic valve regurgitation is  not visualized.    Recent Labs: 09/30/2021: B Natriuretic Peptide 108.2 10/01/2021: TSH 15.414 10/07/2021: Magnesium 1.8 01/08/2022: ALT 21; BUN 8; Creatinine 0.82; Hemoglobin 12.6; Platelet Count 250; Potassium 4.1; Sodium 135  No results found for requested labs within last 8760 hours.   Estimated Creatinine Clearance: 106.5 mL/min (by C-G formula based on SCr of 0.82 mg/dL).   Wt Readings from Last 3 Encounters:  01/12/22 179 lb 8 oz (81.4 kg)  10/14/21 169 lb 14.4 oz (77.1 kg)  10/09/21 167 lb 5.3 oz (75.9 kg)     Other studies reviewed: Additional studies/records reviewed today include: summarized above  ASSESSMENT AND PLAN:  ICD ***  ICM Chronic CHF ***  CAD ***  Pericardial effusion S/p pericardial window Pathology noted mesothelial hyperplasia   Disposition: F/u with ***  Current medicines are reviewed at length with the patient today.  The patient did not have any concerns regarding medicines.  Venetia Night, PA-C 01/14/2022 1:11 PM     [address] [place] [address] [phone] (office)  [phone] (fax)

## 2022-01-15 ENCOUNTER — Encounter: Payer: Medicare Other | Admitting: Physician Assistant

## 2022-02-04 DIAGNOSIS — G546 Phantom limb syndrome with pain: Secondary | ICD-10-CM | POA: Diagnosis not present

## 2022-02-04 DIAGNOSIS — Z6824 Body mass index (BMI) 24.0-24.9, adult: Secondary | ICD-10-CM | POA: Diagnosis not present

## 2022-02-04 DIAGNOSIS — E063 Autoimmune thyroiditis: Secondary | ICD-10-CM | POA: Diagnosis not present

## 2022-02-04 DIAGNOSIS — G894 Chronic pain syndrome: Secondary | ICD-10-CM | POA: Diagnosis not present

## 2022-02-04 DIAGNOSIS — Z89511 Acquired absence of right leg below knee: Secondary | ICD-10-CM | POA: Diagnosis not present

## 2022-02-04 DIAGNOSIS — I10 Essential (primary) hypertension: Secondary | ICD-10-CM | POA: Diagnosis not present

## 2022-02-06 DIAGNOSIS — R918 Other nonspecific abnormal finding of lung field: Secondary | ICD-10-CM | POA: Diagnosis not present

## 2022-02-06 DIAGNOSIS — I70219 Atherosclerosis of native arteries of extremities with intermittent claudication, unspecified extremity: Secondary | ICD-10-CM | POA: Diagnosis not present

## 2022-02-06 DIAGNOSIS — M6281 Muscle weakness (generalized): Secondary | ICD-10-CM | POA: Diagnosis not present

## 2022-02-06 DIAGNOSIS — J449 Chronic obstructive pulmonary disease, unspecified: Secondary | ICD-10-CM | POA: Diagnosis not present

## 2022-03-03 ENCOUNTER — Ambulatory Visit (INDEPENDENT_AMBULATORY_CARE_PROVIDER_SITE_OTHER): Payer: Medicare Other

## 2022-03-03 DIAGNOSIS — I255 Ischemic cardiomyopathy: Secondary | ICD-10-CM

## 2022-03-05 LAB — CUP PACEART REMOTE DEVICE CHECK
Battery Remaining Longevity: 100 mo
Battery Voltage: 2.97 V
Brady Statistic RV Percent Paced: 0.01 %
Date Time Interrogation Session: 20230606201320
HighPow Impedance: 73 Ohm
Implantable Lead Implant Date: 20190606
Implantable Lead Location: 753860
Implantable Pulse Generator Implant Date: 20190606
Lead Channel Impedance Value: 304 Ohm
Lead Channel Impedance Value: 361 Ohm
Lead Channel Pacing Threshold Amplitude: 1.125 V
Lead Channel Pacing Threshold Pulse Width: 0.4 ms
Lead Channel Sensing Intrinsic Amplitude: 14.5 mV
Lead Channel Sensing Intrinsic Amplitude: 14.5 mV
Lead Channel Setting Pacing Amplitude: 2.5 V
Lead Channel Setting Pacing Pulse Width: 0.4 ms
Lead Channel Setting Sensing Sensitivity: 0.3 mV

## 2022-03-08 DIAGNOSIS — I10 Essential (primary) hypertension: Secondary | ICD-10-CM | POA: Diagnosis not present

## 2022-03-08 DIAGNOSIS — G894 Chronic pain syndrome: Secondary | ICD-10-CM | POA: Diagnosis not present

## 2022-03-08 DIAGNOSIS — J449 Chronic obstructive pulmonary disease, unspecified: Secondary | ICD-10-CM | POA: Diagnosis not present

## 2022-03-09 DIAGNOSIS — R918 Other nonspecific abnormal finding of lung field: Secondary | ICD-10-CM | POA: Diagnosis not present

## 2022-03-09 DIAGNOSIS — I70219 Atherosclerosis of native arteries of extremities with intermittent claudication, unspecified extremity: Secondary | ICD-10-CM | POA: Diagnosis not present

## 2022-03-09 DIAGNOSIS — M6281 Muscle weakness (generalized): Secondary | ICD-10-CM | POA: Diagnosis not present

## 2022-03-09 DIAGNOSIS — J449 Chronic obstructive pulmonary disease, unspecified: Secondary | ICD-10-CM | POA: Diagnosis not present

## 2022-03-12 NOTE — Progress Notes (Signed)
Remote ICD transmission.   

## 2022-03-31 ENCOUNTER — Telehealth: Payer: Self-pay | Admitting: Internal Medicine

## 2022-03-31 NOTE — Telephone Encounter (Signed)
.  Called pt per 7/3 inbasket , Patient was unavailable, a message with appt time and date was left with number on file.

## 2022-04-05 DIAGNOSIS — E063 Autoimmune thyroiditis: Secondary | ICD-10-CM | POA: Diagnosis not present

## 2022-04-05 DIAGNOSIS — J449 Chronic obstructive pulmonary disease, unspecified: Secondary | ICD-10-CM | POA: Diagnosis not present

## 2022-04-05 DIAGNOSIS — G894 Chronic pain syndrome: Secondary | ICD-10-CM | POA: Diagnosis not present

## 2022-04-05 DIAGNOSIS — Z89511 Acquired absence of right leg below knee: Secondary | ICD-10-CM | POA: Diagnosis not present

## 2022-04-08 DIAGNOSIS — I70219 Atherosclerosis of native arteries of extremities with intermittent claudication, unspecified extremity: Secondary | ICD-10-CM | POA: Diagnosis not present

## 2022-04-08 DIAGNOSIS — R918 Other nonspecific abnormal finding of lung field: Secondary | ICD-10-CM | POA: Diagnosis not present

## 2022-04-08 DIAGNOSIS — M6281 Muscle weakness (generalized): Secondary | ICD-10-CM | POA: Diagnosis not present

## 2022-04-08 DIAGNOSIS — J449 Chronic obstructive pulmonary disease, unspecified: Secondary | ICD-10-CM | POA: Diagnosis not present

## 2022-04-13 ENCOUNTER — Inpatient Hospital Stay: Payer: Medicare Other

## 2022-04-13 ENCOUNTER — Other Ambulatory Visit: Payer: Medicare Other

## 2022-04-13 ENCOUNTER — Inpatient Hospital Stay: Payer: Medicare Other | Attending: Physician Assistant | Admitting: Internal Medicine

## 2022-04-19 ENCOUNTER — Other Ambulatory Visit: Payer: Self-pay

## 2022-04-19 ENCOUNTER — Telehealth: Payer: Self-pay | Admitting: Medical Oncology

## 2022-04-19 NOTE — Telephone Encounter (Signed)
F/U Missed appts -I left two messages to call back to r/s port flush with labs , scans and f/u appt. Dates for labs and scan changed and I sent a schedule message to see Julien Nordmann week of aug 1st.

## 2022-04-21 ENCOUNTER — Other Ambulatory Visit (HOSPITAL_COMMUNITY): Payer: Medicare Other

## 2022-05-05 DIAGNOSIS — G894 Chronic pain syndrome: Secondary | ICD-10-CM | POA: Diagnosis not present

## 2022-05-05 DIAGNOSIS — C3490 Malignant neoplasm of unspecified part of unspecified bronchus or lung: Secondary | ICD-10-CM | POA: Diagnosis not present

## 2022-05-05 DIAGNOSIS — Z6824 Body mass index (BMI) 24.0-24.9, adult: Secondary | ICD-10-CM | POA: Diagnosis not present

## 2022-05-09 DIAGNOSIS — I70219 Atherosclerosis of native arteries of extremities with intermittent claudication, unspecified extremity: Secondary | ICD-10-CM | POA: Diagnosis not present

## 2022-05-09 DIAGNOSIS — M6281 Muscle weakness (generalized): Secondary | ICD-10-CM | POA: Diagnosis not present

## 2022-05-09 DIAGNOSIS — R918 Other nonspecific abnormal finding of lung field: Secondary | ICD-10-CM | POA: Diagnosis not present

## 2022-05-09 DIAGNOSIS — J449 Chronic obstructive pulmonary disease, unspecified: Secondary | ICD-10-CM | POA: Diagnosis not present

## 2022-05-17 ENCOUNTER — Other Ambulatory Visit: Payer: Self-pay

## 2022-05-17 ENCOUNTER — Telehealth: Payer: Self-pay

## 2022-05-17 NOTE — Telephone Encounter (Signed)
Pt and wife called requesting CT scan and have decided to return to pursue tx. Pt was advised to call Radiology scheduling to r/s the appt he  cancelled last week.  Also, a scheduling message has been sent to get him scheduled for PF w/labs and office visit.

## 2022-05-20 ENCOUNTER — Telehealth: Payer: Self-pay

## 2022-05-20 NOTE — Telephone Encounter (Signed)
I spoke with pts wife on 05/17/22 and advised them to please schedule his CT scan.   I attempted to followed up with with the pt because he still has not scheduled the CT and he has an appt on 05/25/22.  I LVM on pts home phone and his wives cell number. Pt personal cell number is out of service.

## 2022-05-20 NOTE — Progress Notes (Deleted)
[facility] OFFICE PROGRESS NOTE  Redmond School, MD [address] [place] [address]  DIAGNOSIS: Recurrent non-small cell lung cancer initially diagnosed as stage IIIA (T3, N2, M0) non-small cell lung cancer, adenosquamous carcinoma diagnosed in June 2019 and presented with large right upper lobe lung mass with questionable chest wall invasion as well as right hilar and mediastinal lymphadenopathy. He had disease progression in June 2021.   Biomarker Findings Tumor Mutational Burden - TMB-High (21 Muts/Mb) Microsatellite status - MS-Stable Genomic Findings For a complete list of the genes assayed, please refer to the Appendix. ATM C5885* CCND2 P281R KRAS G12V CHEK2 T325fs*15 TP53 E298* 7 Disease relevant genes with no reportable alterations: ALK, EGFR, BRAF, MET, RET, ERBB2, ROS1   PDL1 Expression: 99%.  PRIOR THERAPY: 1) Concurrent chemoradiation with chemotherapy consisting of weekly carboplatin for an AUC of 2 and paclitaxel 45 mg/m2.  First dose given on 04/03/2018.  Status post 6 cycles.  Last dose was giving 05/15/2018 with partial response. 2) Consolidation treatment with immunotherapy with Imfinzi (Durvalumab) 10 mg/KG every 2 weeks.  First dose 06/20/2018.  Status post 26 cycles. 3) Systemic treatment with immunotherapy with Keytruda 200 mg IV every 3 weeks.  First dose February 27, 2020. Status post 12 cycles. Discontinued due to disease progression.    CURRENT THERAPY: Carboplatin for an AUC of 5 and Alimta 500 mg per metered squared on days 1 IV every 3 weeks.  First dose expected on 03/03/2021. Status post 10 cycle. His dose of carboplatin was reduced to an AUC of 4 and Alimta reduced to 400 mg/m2 due to intolerance. Starting from cycle #6, we will start him on maintenance Alimta 400 mg/m2.  His treatment is currently on hold secondary to toxicity.  INTERVAL HISTORY: [name] 61 y.o. male returns to clinic today for a follow-up visit accompanied by his  wife.  The patient was last seen in the clinic on 01/12/2022.  The patient is not very compliant with his appointment secondary to memory issues.  His chemotherapy has been on hold due to toxicity.  He was supposed to follow-up With a repeat CT scan in July 2023.  The patient did not come to his appointment due to ***.    January -He recently underwent pericardial window for drainage of pericardial effusion and that showed no evidence for malignancy. Status post 10 cycles.   Since last being seen* **the patient denies any fever, chills, night sweats, or unexplained weight loss.  Breathing?  Denies any chest pain or hemoptysis.  He reports baseline dyspnea on exertion and chronic cough for which he unfortunately continues to smoke.  Nausea?  Denies any diarrhea or vomiting.  He has baseline constipation for which he takes a stool softener.  Denies any headache or visual changes.  He was supposed to have a restaging CT scan performed.  He is here today for evaluation and to discuss the next steps in his care.  MEDICAL HISTORY: Past Medical History:  Diagnosis Date   Acute hepatitis C virus infection 06/19/2007   Qualifier: Diagnosis of  By: Leilani Merl CMA, Tiffany     Acute ST elevation myocardial infarction (STEMI) involving left anterior descending (LAD) coronary artery (HCC) 05/26/2016   Acute ST elevation myocardial infarction (STEMI) of lateral wall (HCC) 05/26/2016   AICD (automatic cardioverter/defibrillator) present 03/02/2018   Anxiety    Asthma    Atherosclerosis of native arteries of the extremities with intermittent claudication 12/16/2011   Chronic hepatitis C without hepatic  coma ([facility]) 05/03/2014   COPD with chronic bronchitis and emphysema ([facility]) 03/13/2018   FEV1 57%   Depression    DVT (deep venous thrombosis) ([facility]) 2009; 2019   RLE; LLE   Encounter for antineoplastic chemotherapy 03/22/2018   Hepatitis C    "tx'd in 2015"   High cholesterol    Ischemic cardiomyopathy 03/02/2018    Non-small cell carcinoma of right lung, stage 3 ([facility]) 03/22/2018   PAD (peripheral artery disease) ([facility]) 01/25/2013   Peripheral vascular disease, unspecified 07/20/2012   Port-A-Cath in place 04/24/2018   ST elevation myocardial infarction involving left anterior descending (LAD) coronary artery (HCC)    STEMI (ST elevation myocardial infarction) ([facility]) 05/26/2016   Tobacco abuse 01/28/2016   Tubular adenoma of colon 07/11/2014   Urinary dribbling     ALLERGIES:  is allergic to lyrica [pregabalin] and neurontin [gabapentin].  MEDICATIONS:  Current Outpatient Medications  Medication Sig Dispense Refill   albuterol (VENTOLIN HFA) 108 (90 Base) MCG/ACT inhaler Inhale 1-2 puffs into the lungs every 4 (four) hours as needed for wheezing or shortness of breath.     alprazolam (XANAX) 2 MG tablet Take 2 mg by mouth 4 (four) times daily as needed for anxiety.     amitriptyline (ELAVIL) 100 MG tablet Take 100 mg by mouth at bedtime.      aspirin 81 MG EC tablet Take 81 mg by mouth daily.     atorvastatin (LIPITOR) 80 MG tablet Take 80 mg by mouth daily.     citalopram (CELEXA) 40 MG tablet Take 40 mg by mouth daily.  11   dexamethasone (DECADRON) 4 MG tablet Please take 1 tablet twice a day the day before, the day of, and the day after treatment (Patient taking differently: Take 4 mg by mouth See admin instructions. Please take 1 tablet twice a day the day before, the day of, and the day after treatment) 40 tablet 2   folic acid (FOLVITE) 1 MG tablet Take 1 tablet (1 mg total) by mouth daily. 30 tablet 2   HYDROcodone-acetaminophen (NORCO) 10-325 MG tablet Take 1 tablet by mouth every 4 (four) hours as needed for moderate pain. 20 tablet 0   levothyroxine (SYNTHROID) 125 MCG tablet Take 125 mcg by mouth daily.     lidocaine-prilocaine (EMLA) cream Apply 1 application topically as needed. (Patient taking differently: Apply 1 application topically daily as needed (port access).) 30 g 0   midodrine  (PROAMATINE) 10 MG tablet Take 1 tablet (10 mg total) by mouth 3 (three) times daily with meals. 90 tablet 1   nitroGLYCERIN (NITROSTAT) 0.4 MG SL tablet Place 1 tablet (0.4 mg total) under the tongue every 5 (five) minutes x 3 doses as needed for chest pain. (Patient not taking: Reported on 10/14/2021) 25 tablet 2   prochlorperazine (COMPAZINE) 10 MG tablet Take 1 tablet (10 mg total) by mouth every 6 (six) hours as needed for nausea or vomiting. (Patient not taking: Reported on 10/14/2021) 30 tablet 2   sucralfate (CARAFATE) 1 g tablet Take 1 tablet (1 g total) by mouth 4 (four) times daily -  with meals and at bedtime. 5 min before meals for radiation induced esophagitis (Patient taking differently: Take 1 g by mouth daily.) 120 tablet 2   tamsulosin (FLOMAX) 0.4 MG CAPS capsule Take 0.4 mg by mouth daily.     TRELEGY ELLIPTA 100-62.5-25 MCG/INH AEPB Inhale 1 puff into the lungs daily.     No current facility-administered medications for this visit.  SURGICAL HISTORY:  Past Surgical History:  Procedure Laterality Date   AORTOGRAM  08/08/2009   for LLE claudication     By Dr. 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   BIOPSY N/A 05/30/2014   Procedure: BIOPSY;  Surgeon: [name], MD;  Location: [facility];  Service: Endoscopy;  Laterality: N/A;   BLADDER TUMOR EXCISION  8/812   CARDIAC CATHETERIZATION N/A 05/26/2016   Procedure: Left Heart Cath and Coronary Angiography;  Surgeon: [name], MD;  Location: [facility];  Service: Cardiovascular;  Laterality: N/A;   CARDIAC CATHETERIZATION N/A 05/26/2016   Procedure: Coronary Stent Intervention;  Surgeon: [name], MD;  Location: [facility];  Service: Cardiovascular;  Laterality: N/A;   COLONOSCOPY WITH PROPOFOL N/A 05/30/2014   SAY:TKZSWF colonic polyp-likely source of hematochezia-removed as described above   ESOPHAGOGASTRODUODENOSCOPY (EGD) WITH PROPOFOL N/A 05/30/2014   UXN:ATFTDD and bulbar erosions s/p  gastric biopsy. No evidence of portal gastropathy on today's examination.   FEMORAL-TIBIAL BYPASS GRAFT  2009   Right side using non-reversed GSV   By Dr. Anson [place] BYPASS GRAFT  11/24/2007   Right femoral to anterior tibial BPG   by Dr. Oneida Alar   FINGER SURGERY Left    "straightened my pinky"   HAND TENDON SURGERY Left 2013   Left 5th finger   HERNIA REPAIR     "stomach"   ICD IMPLANT N/A 03/02/2018   Procedure: ICD IMPLANT;  Surgeon: [name], MD;  Location: [facility];  Service: Cardiovascular;  Laterality: N/A;   INCISIONAL HERNIA REPAIR N/A 12/25/2014   Procedure: HERNIA REPAIR INCISIONAL WITH MESH;  Surgeon: [name], MD;  Location: [facility];  Service: General;  Laterality: N/A;   INSERTION OF MESH N/A 12/25/2014   Procedure: INSERTION OF MESH;  Surgeon: [name], MD;  Location: [facility];  Service: General;  Laterality: N/A;   IR IMAGING GUIDED PORT INSERTION  04/11/2018   LAPAROSCOPIC CHOLECYSTECTOMY     LOWER EXTREMITY ANGIOGRAM Left 12/30/2015   Procedure: Lower Extremity Angiogram;  Surgeon: [name], MD;  Location: [facility];  Service: Cardiovascular;  Laterality: Left;   PERIPHERAL VASCULAR CATHETERIZATION N/A 12/30/2015   Procedure: Abdominal Aortogram;  Surgeon: [name], MD;  Location: [facility];  Service: Cardiovascular;  Laterality: N/A;   POLYPECTOMY  05/30/2014   Procedure: POLYPECTOMY;  Surgeon: [name], MD;  Location: [facility];  Service: Endoscopy;;   TONSILLECTOMY AND ADENOIDECTOMY  ~ 1970   TYMPANOSTOMY TUBE PLACEMENT Bilateral ~ [place] Left 10/04/2021   Procedure: PERICARDIAL WINDOW VIDEO ASSISTED THORACOSCOPY APPROACH;  Surgeon: Lajuana Matte, MD;  Location: [facility];  Service: Thoracic;  Laterality: Left;  pericardial window.  full lateral    REVIEW OF SYSTEMS:   Review of Systems  Constitutional: Negative for appetite change, chills, fatigue, fever and  unexpected weight change.  HENT:   Negative for mouth sores, nosebleeds, sore throat and trouble swallowing.   Eyes: Negative for eye problems and icterus.  Respiratory: Negative for cough, hemoptysis, shortness of breath and wheezing.   Cardiovascular: Negative for chest pain and leg swelling.  Gastrointestinal: Negative for abdominal pain, constipation, diarrhea, nausea and vomiting.  Genitourinary: Negative for bladder incontinence, difficulty urinating, dysuria, frequency and hematuria.   Musculoskeletal: Negative for back pain, gait problem, neck pain and neck stiffness.  Skin: Negative for itching and rash.  Neurological: Negative for dizziness, extremity weakness, gait problem, headaches, light-headedness and seizures.  Hematological: Negative for adenopathy. Does not bruise/bleed easily.  Psychiatric/Behavioral: Negative for confusion, depression and sleep disturbance. The patient is not nervous/anxious.     PHYSICAL EXAMINATION:  There were no vitals taken for this visit.  ECOG PERFORMANCE STATUS: {CHL ONC ECOG [id number]  Physical Exam  Constitutional: Oriented to person, place, and time and well-developed, well-nourished, and in no distress. No distress.  HENT:  Head: Normocephalic and atraumatic.  Mouth/Throat: Oropharynx is clear and moist. No oropharyngeal exudate.  Eyes: Conjunctivae are normal. Right eye exhibits no discharge. Left eye exhibits no discharge. No scleral icterus.  Neck: Normal range of motion. Neck supple.  Cardiovascular: Normal rate, regular rhythm, normal heart sounds and intact distal pulses.   Pulmonary/Chest: Effort normal and breath sounds normal. No respiratory distress. No wheezes. No rales.  Abdominal: Soft. Bowel sounds are normal. Exhibits no distension and no mass. There is no tenderness.  Musculoskeletal: Normal range of motion. Exhibits no edema.  Lymphadenopathy:    No cervical adenopathy.  Neurological: Alert and oriented to person,  place, and time. Exhibits normal muscle tone. Gait normal. Coordination normal.  Skin: Skin is warm and dry. No rash noted. Not diaphoretic. No erythema. No pallor.  Psychiatric: Mood, memory and judgment normal.  Vitals reviewed.  LABORATORY DATA: Lab Results  Component Value Date   WBC 6.9 01/08/2022   HGB 12.6 (L) 01/08/2022   HCT 39.4 01/08/2022   MCV 90.6 01/08/2022   PLT 250 01/08/2022      Chemistry      Component Value Date/Time   NA 135 01/08/2022 1143   NA 139 02/27/2018 1457   K 4.1 01/08/2022 1143   CL 96 (L) 01/08/2022 1143   CO2 33 (H) 01/08/2022 1143   BUN 8 01/08/2022 1143   BUN 7 02/27/2018 1457   CREATININE 0.82 01/08/2022 1143   CREATININE 1.13 06/28/2016 1453      Component Value Date/Time   CALCIUM 8.9 01/08/2022 1143   ALKPHOS 110 01/08/2022 1143   AST 24 01/08/2022 1143   ALT 21 01/08/2022 1143   BILITOT 0.3 01/08/2022 1143       RADIOGRAPHIC STUDIES:  No results found.   ASSESSMENT/PLAN:  This is a very pleasant 61 year old Caucasian male with recurrent lung cancer initially diagnosed as stage IIIa non-small cell lung cancer, adenosquamous carcinoma.  He presented with a large right upper lobe lung mass with questionable chest wall invasion as well as a right hilar and mediastinal lymphadenopathy.  He was diagnosed in June 2019. His PDL1 expression is 99%  He completed 6 cycles of concurrent chemoradiation with carboplatin and paclitaxel.  He had a partial response to treatment.   The patient underwent consolidation immunotherapy with Imfinzi 10 mg/kg IV every 2 weeks.  He is status post 26 cycles.  He completed this on 07/04/2019   He was on observation until he showed evidence of local recurrence.  He then underwent immunotherapy with Keytruda 200 mg IV every 3 weeks. He is status post 9 cycles. He tolerated this well without any concerning complains. The patient has missed several appointments since November 2021. He only returned to the  clinic once for treatment in that interval. Therefore, on his follow up imaging studies in March 2022, he had evidence of disease progession.  He restarted treatment at that time and continued to tolerate it well. Unfortunately, he had evidence for disease progression in May 2022 and this was  discontinued.   Patient then underwent carboplatin for AUC of 5, Alimta 500 mg per metered squared IV every 3 weeks.  He is status post***cycles.  Carboplatin was reduced to an AUC of 4 and Alimta was reduced to 400 mg per metered squared due to myelosuppression.  He started maintenance single agent Alimta 400 mg per metered squared starting from cycle #6.  He is status post 10 cycles.  The patient took a break from treatment after cycle #10 due to ***.   The patient was supposed to have a restaging CT scan on ***.   This has not been scheduled at this time.   The patient was seen with Dr. Julien Nordmann today. Labs were ervieed. Recommend that   No orders of the defined types were placed in this encounter.    I spent {CHL ONC TIME VISIT - VFMBB:[id number]} counseling the patient face to face. The total time spent in the appointment was {CHL ONC TIME VISIT - VKFMM:[id number]}.  Altan Kraai L Najma Bozarth, PA-C 05/20/22

## 2022-05-25 ENCOUNTER — Inpatient Hospital Stay: Payer: Medicare Other | Admitting: Physician Assistant

## 2022-05-25 ENCOUNTER — Inpatient Hospital Stay: Payer: Medicare Other

## 2022-05-25 ENCOUNTER — Inpatient Hospital Stay: Payer: Medicare Other | Attending: Physician Assistant

## 2022-06-02 ENCOUNTER — Ambulatory Visit (INDEPENDENT_AMBULATORY_CARE_PROVIDER_SITE_OTHER): Payer: Medicare Other

## 2022-06-02 DIAGNOSIS — I255 Ischemic cardiomyopathy: Secondary | ICD-10-CM

## 2022-06-02 LAB — CUP PACEART REMOTE DEVICE CHECK
Battery Remaining Longevity: 97 mo
Battery Voltage: 2.97 V
Brady Statistic RV Percent Paced: 0.01 %
Date Time Interrogation Session: 20230906001803
HighPow Impedance: 72 Ohm
Implantable Lead Implant Date: 20190606
Implantable Lead Location: 753860
Implantable Pulse Generator Implant Date: 20190606
Lead Channel Impedance Value: 285 Ohm
Lead Channel Impedance Value: 342 Ohm
Lead Channel Pacing Threshold Amplitude: 1 V
Lead Channel Pacing Threshold Pulse Width: 0.4 ms
Lead Channel Sensing Intrinsic Amplitude: 15.125 mV
Lead Channel Sensing Intrinsic Amplitude: 15.125 mV
Lead Channel Setting Pacing Amplitude: 2.5 V
Lead Channel Setting Pacing Pulse Width: 0.4 ms
Lead Channel Setting Sensing Sensitivity: 0.3 mV

## 2022-06-04 DIAGNOSIS — I10 Essential (primary) hypertension: Secondary | ICD-10-CM | POA: Diagnosis not present

## 2022-06-04 DIAGNOSIS — J449 Chronic obstructive pulmonary disease, unspecified: Secondary | ICD-10-CM | POA: Diagnosis not present

## 2022-06-04 DIAGNOSIS — G894 Chronic pain syndrome: Secondary | ICD-10-CM | POA: Diagnosis not present

## 2022-06-09 DIAGNOSIS — M6281 Muscle weakness (generalized): Secondary | ICD-10-CM | POA: Diagnosis not present

## 2022-06-09 DIAGNOSIS — J449 Chronic obstructive pulmonary disease, unspecified: Secondary | ICD-10-CM | POA: Diagnosis not present

## 2022-06-09 DIAGNOSIS — R918 Other nonspecific abnormal finding of lung field: Secondary | ICD-10-CM | POA: Diagnosis not present

## 2022-06-09 DIAGNOSIS — I70219 Atherosclerosis of native arteries of extremities with intermittent claudication, unspecified extremity: Secondary | ICD-10-CM | POA: Diagnosis not present

## 2022-06-21 NOTE — Progress Notes (Signed)
Remote ICD transmission.   

## 2022-07-01 DIAGNOSIS — J449 Chronic obstructive pulmonary disease, unspecified: Secondary | ICD-10-CM | POA: Diagnosis not present

## 2022-07-01 DIAGNOSIS — G894 Chronic pain syndrome: Secondary | ICD-10-CM | POA: Diagnosis not present

## 2022-07-01 DIAGNOSIS — I10 Essential (primary) hypertension: Secondary | ICD-10-CM | POA: Diagnosis not present

## 2022-07-01 DIAGNOSIS — C3490 Malignant neoplasm of unspecified part of unspecified bronchus or lung: Secondary | ICD-10-CM | POA: Diagnosis not present

## 2022-07-05 ENCOUNTER — Telehealth: Payer: Self-pay | Admitting: Medical Oncology

## 2022-07-05 NOTE — Telephone Encounter (Signed)
Wife called to report pt has been off chemo and radiation for a year and has a port.  I called wife and LVM to return my call and let me know if Jade needs anything.

## 2022-07-08 NOTE — Telephone Encounter (Addendum)
Schedule message sent to schedule port flush with labs , CT and f/u appt.

## 2022-07-09 ENCOUNTER — Telehealth: Payer: Self-pay | Admitting: Internal Medicine

## 2022-07-09 DIAGNOSIS — R918 Other nonspecific abnormal finding of lung field: Secondary | ICD-10-CM | POA: Diagnosis not present

## 2022-07-09 DIAGNOSIS — I70219 Atherosclerosis of native arteries of extremities with intermittent claudication, unspecified extremity: Secondary | ICD-10-CM | POA: Diagnosis not present

## 2022-07-09 DIAGNOSIS — M6281 Muscle weakness (generalized): Secondary | ICD-10-CM | POA: Diagnosis not present

## 2022-07-09 DIAGNOSIS — J449 Chronic obstructive pulmonary disease, unspecified: Secondary | ICD-10-CM | POA: Diagnosis not present

## 2022-07-09 NOTE — Telephone Encounter (Signed)
Left message with follow-up appointments per 10/12 schedule message.

## 2022-07-14 ENCOUNTER — Ambulatory Visit (HOSPITAL_COMMUNITY): Admission: RE | Admit: 2022-07-14 | Payer: Medicare Other | Source: Ambulatory Visit

## 2022-07-14 ENCOUNTER — Inpatient Hospital Stay: Payer: Medicare Other | Attending: Physician Assistant

## 2022-07-14 DIAGNOSIS — E039 Hypothyroidism, unspecified: Secondary | ICD-10-CM | POA: Insufficient documentation

## 2022-07-14 DIAGNOSIS — C3411 Malignant neoplasm of upper lobe, right bronchus or lung: Secondary | ICD-10-CM | POA: Insufficient documentation

## 2022-07-14 DIAGNOSIS — Z79899 Other long term (current) drug therapy: Secondary | ICD-10-CM | POA: Insufficient documentation

## 2022-07-16 ENCOUNTER — Telehealth: Payer: Self-pay | Admitting: Medical Oncology

## 2022-07-16 NOTE — Telephone Encounter (Signed)
pt did not show up on 10/18 for his lab and Ct scan. I called pt and LVM that his appt Monday 10/23 is cancelled and to call me back if he wants to continue seeing Dr Julien Nordmann .

## 2022-07-19 ENCOUNTER — Inpatient Hospital Stay: Payer: Medicare Other | Admitting: Internal Medicine

## 2022-07-26 ENCOUNTER — Inpatient Hospital Stay: Payer: Medicare Other

## 2022-07-26 ENCOUNTER — Ambulatory Visit (HOSPITAL_COMMUNITY)
Admission: RE | Admit: 2022-07-26 | Discharge: 2022-07-26 | Disposition: A | Discharge status: Home / Self Care | Payer: Medicare Other | Source: Ambulatory Visit | Attending: Internal Medicine | Admitting: Internal Medicine

## 2022-07-26 ENCOUNTER — Telehealth: Payer: Self-pay | Admitting: Medical Oncology

## 2022-07-26 DIAGNOSIS — C3490 Malignant neoplasm of unspecified part of unspecified bronchus or lung: Secondary | ICD-10-CM

## 2022-07-26 DIAGNOSIS — E039 Hypothyroidism, unspecified: Secondary | ICD-10-CM | POA: Diagnosis not present

## 2022-07-26 DIAGNOSIS — Z79899 Other long term (current) drug therapy: Secondary | ICD-10-CM | POA: Diagnosis not present

## 2022-07-26 DIAGNOSIS — J479 Bronchiectasis, uncomplicated: Secondary | ICD-10-CM | POA: Diagnosis not present

## 2022-07-26 DIAGNOSIS — J439 Emphysema, unspecified: Secondary | ICD-10-CM | POA: Diagnosis not present

## 2022-07-26 DIAGNOSIS — N289 Disorder of kidney and ureter, unspecified: Secondary | ICD-10-CM | POA: Diagnosis not present

## 2022-07-26 DIAGNOSIS — C3411 Malignant neoplasm of upper lobe, right bronchus or lung: Secondary | ICD-10-CM | POA: Diagnosis not present

## 2022-07-26 DIAGNOSIS — C3491 Malignant neoplasm of unspecified part of right bronchus or lung: Secondary | ICD-10-CM

## 2022-07-26 DIAGNOSIS — Z95828 Presence of other vascular implants and grafts: Secondary | ICD-10-CM

## 2022-07-26 LAB — CBC WITH DIFFERENTIAL (CANCER CENTER ONLY)
Abs Immature Granulocytes: 0.04 10*3/uL (ref 0.00–0.07)
Basophils Absolute: 0.1 10*3/uL (ref 0.0–0.1)
Basophils Relative: 1 %
Eosinophils Absolute: 0.1 10*3/uL (ref 0.0–0.5)
Eosinophils Relative: 1 %
HCT: 42.7 % (ref 39.0–52.0)
Hemoglobin: 14.4 g/dL (ref 13.0–17.0)
Immature Granulocytes: 1 %
Lymphocytes Relative: 15 %
Lymphs Abs: 1.1 10*3/uL (ref 0.7–4.0)
MCH: 30.6 pg (ref 26.0–34.0)
MCHC: 33.7 g/dL (ref 30.0–36.0)
MCV: 90.7 fL (ref 80.0–100.0)
Monocytes Absolute: 0.6 10*3/uL (ref 0.1–1.0)
Monocytes Relative: 9 %
Neutro Abs: 5.1 10*3/uL (ref 1.7–7.7)
Neutrophils Relative %: 73 %
Platelet Count: 254 10*3/uL (ref 150–400)
RBC: 4.71 MIL/uL (ref 4.22–5.81)
RDW: 14.6 % (ref 11.5–15.5)
WBC Count: 7 10*3/uL (ref 4.0–10.5)
nRBC: 0 % (ref 0.0–0.2)

## 2022-07-26 LAB — CMP (CANCER CENTER ONLY)
ALT: 14 U/L (ref 0–44)
AST: 15 U/L (ref 15–41)
Albumin: 4.1 g/dL (ref 3.5–5.0)
Alkaline Phosphatase: 132 U/L — ABNORMAL HIGH (ref 38–126)
Anion gap: 5 (ref 5–15)
BUN: 7 mg/dL (ref 6–20)
CO2: 32 mmol/L (ref 22–32)
Calcium: 8.8 mg/dL — ABNORMAL LOW (ref 8.9–10.3)
Chloride: 98 mmol/L (ref 98–111)
Creatinine: 1.01 mg/dL (ref 0.61–1.24)
GFR, Estimated: >60 mL/min (ref >60)
Glucose, Bld: 100 mg/dL — ABNORMAL HIGH (ref 70–99)
Potassium: 3.7 mmol/L (ref 3.5–5.1)
Sodium: 135 mmol/L (ref 135–145)
Total Bilirubin: 0.5 mg/dL (ref 0.3–1.2)
Total Protein: 7.3 g/dL (ref 6.5–8.1)

## 2022-07-26 MED ORDER — SODIUM CHLORIDE (PF) 0.9 % IJ SOLN
INTRAMUSCULAR | Status: AC
  Filled 2022-07-26: qty 50

## 2022-07-26 MED ORDER — IOHEXOL 9 MG/ML PO SOLN
1000.0000 mL | ORAL | Status: AC
  Administered 2022-07-26: 1000 mL via ORAL

## 2022-07-26 MED ORDER — IOHEXOL 300 MG/ML  SOLN
100.0000 mL | Freq: Once | INTRAMUSCULAR | Status: AC | PRN
  Administered 2022-07-26: 100 mL via INTRAVENOUS

## 2022-07-26 MED ORDER — SODIUM CHLORIDE 0.9% FLUSH
10.0000 mL | Freq: Once | INTRAVENOUS | Status: AC
  Administered 2022-07-26: 10 mL

## 2022-07-26 MED ORDER — IOHEXOL 9 MG/ML PO SOLN
ORAL | Status: AC
  Filled 2022-07-26: qty 1000

## 2022-07-26 MED ORDER — HEPARIN SOD (PORK) LOCK FLUSH 100 UNIT/ML IV SOLN
INTRAVENOUS | Status: AC
  Filled 2022-07-26: qty 5

## 2022-07-26 MED ORDER — HEPARIN SOD (PORK) LOCK FLUSH 100 UNIT/ML IV SOLN
500.0000 [IU] | Freq: Once | INTRAVENOUS | Status: AC
  Administered 2022-07-26: 500 [IU] via INTRAVENOUS

## 2022-07-26 NOTE — Telephone Encounter (Signed)
Returned wifes call . Gave her appt information.

## 2022-07-27 DIAGNOSIS — Z89511 Acquired absence of right leg below knee: Secondary | ICD-10-CM | POA: Diagnosis not present

## 2022-07-29 ENCOUNTER — Other Ambulatory Visit: Payer: Self-pay

## 2022-07-29 ENCOUNTER — Inpatient Hospital Stay: Payer: Medicare Other | Attending: Physician Assistant | Admitting: Internal Medicine

## 2022-07-29 VITALS — BP 104/81 | HR 95 | Temp 98.1°F | Resp 15 | Ht 72.0 in | Wt 192.0 lb

## 2022-07-29 DIAGNOSIS — E039 Hypothyroidism, unspecified: Secondary | ICD-10-CM | POA: Insufficient documentation

## 2022-07-29 DIAGNOSIS — I252 Old myocardial infarction: Secondary | ICD-10-CM | POA: Diagnosis not present

## 2022-07-29 DIAGNOSIS — C3411 Malignant neoplasm of upper lobe, right bronchus or lung: Secondary | ICD-10-CM | POA: Insufficient documentation

## 2022-07-29 DIAGNOSIS — E7800 Pure hypercholesterolemia, unspecified: Secondary | ICD-10-CM | POA: Insufficient documentation

## 2022-07-29 DIAGNOSIS — C3490 Malignant neoplasm of unspecified part of unspecified bronchus or lung: Secondary | ICD-10-CM

## 2022-07-29 DIAGNOSIS — Z7982 Long term (current) use of aspirin: Secondary | ICD-10-CM | POA: Diagnosis not present

## 2022-07-29 DIAGNOSIS — Z79899 Other long term (current) drug therapy: Secondary | ICD-10-CM | POA: Insufficient documentation

## 2022-07-29 DIAGNOSIS — Z95810 Presence of automatic (implantable) cardiac defibrillator: Secondary | ICD-10-CM | POA: Diagnosis not present

## 2022-07-29 DIAGNOSIS — Z7952 Long term (current) use of systemic steroids: Secondary | ICD-10-CM | POA: Insufficient documentation

## 2022-07-29 DIAGNOSIS — I10 Essential (primary) hypertension: Secondary | ICD-10-CM | POA: Diagnosis not present

## 2022-07-29 NOTE — Progress Notes (Signed)
[facility] Telephone:([phone]) [phone]   Fax:(336) [phone]  OFFICE PROGRESS NOTE  Redmond [employer], MD [address] [place] [address]  DIAGNOSIS: Recurrent non-small cell lung cancer initially diagnosed as stage IIIA (T3, N2, M0) non-small cell lung cancer, adenosquamous carcinoma diagnosed in June 2019 and presented with large right upper lobe lung mass with questionable chest wall invasion as well as right hilar and mediastinal lymphadenopathy. He had disease progression in June 2021.  Biomarker Findings Tumor Mutational Burden - TMB-High (21 Muts/Mb) Microsatellite status - MS-Stable Genomic Findings For a complete list of the genes assayed, please refer to the Appendix. ATM E9937* CCND2 P281R KRAS G12V CHEK2 T352f*15 TP53 E298* 7 Disease relevant genes with no reportable alterations: ALK, EGFR, BRAF, MET, RET, ERBB2, ROS1  PDL1 Expression: 99%.   PRIOR THERAPY:  1) Concurrent chemoradiation with chemotherapy consisting of weekly carboplatin for an AUC of 2 and paclitaxel 45 mg/m2.  First dose given on 04/03/2018.  Status post 6 cycles.  Last dose was giving 05/15/2018 with partial response. 2) Consolidation treatment with immunotherapy with Imfinzi (Durvalumab) 10 mg/KG every 2 weeks.  First dose 06/20/2018.  Status post 26 cycles. 3) Systemic treatment with immunotherapy with Keytruda 200 mg IV every 3 weeks.  First dose February 27, 2020. Status post 12 cycles. Discontinued due to disease progression.   CURRENT THERAPY:  Carboplatin for an AUC of 5 and Alimta 500 mg per metered squared on days 1 IV every 3 weeks.  First dose expected on 03/03/2021. Status post 10 cycle. His dose of carboplatin was reduced to an AUC of 4 and Alimta reduced to 400 mg/m2 due to intolerance. Starting from cycle #6, we will start him on maintenance Alimta 400 mg/m2.  His treatment is currently on hold secondary to toxicity.  INTERVAL HISTORY: Mark ALDREDGE89y.o. male returns to  the clinic today for follow-up visit accompanied by his wife.  The patient was lost to follow-up for several months recently.  [place].  He denied having any current chest pain, shortness of breath, cough or hemoptysis.  He has no nausea, vomiting, diarrhea or constipation.  He has no headache or visual changes.  He denied having any fever or chills.  He has no recent weight loss or night sweats.   MEDICAL HISTORY: Past Medical History:  Diagnosis Date   Acute hepatitis C virus infection 06/19/2007   Qualifier: Diagnosis of  By: MLeilani MerlCMA, Tiffany     Acute ST elevation myocardial infarction (STEMI) involving left anterior descending (LAD) coronary artery (HCC) 05/26/2016   Acute ST elevation myocardial infarction (STEMI) of lateral wall (HCC) 05/26/2016   AICD (automatic cardioverter/defibrillator) present 03/02/2018   Anxiety    Asthma    Atherosclerosis of native arteries of the extremities with intermittent claudication 12/16/2011   Chronic hepatitis C without hepatic coma ([facility] 05/03/2014   COPD with chronic bronchitis and emphysema ([facility] 03/13/2018   FEV1 57%   Depression    DVT (deep venous thrombosis) ([facility] 2009; 2019   RLE; LLE   Encounter for antineoplastic chemotherapy 03/22/2018   Hepatitis C    "tx'd in 2015"   High cholesterol    Ischemic cardiomyopathy 03/02/2018   Non-small cell carcinoma of right lung, stage 3 ([facility] 03/22/2018   PAD (peripheral artery disease) ([facility] 01/25/2013   Peripheral vascular disease, unspecified 07/20/2012   Port-A-Cath in place  04/24/2018   ST elevation myocardial infarction involving left anterior descending (LAD) coronary artery (HCC)    STEMI (ST elevation myocardial infarction) ([facility]) 05/26/2016   Tobacco abuse 01/28/2016   Tubular adenoma of colon 07/11/2014   Urinary dribbling     ALLERGIES:  is allergic to lyrica [pregabalin] and neurontin [gabapentin].  MEDICATIONS:   Current Outpatient Medications  Medication Sig Dispense Refill   albuterol (VENTOLIN HFA) 108 (90 Base) MCG/ACT inhaler Inhale 1-2 puffs into the lungs every 4 (four) hours as needed for wheezing or shortness of breath.     alprazolam (XANAX) 2 MG tablet Take 2 mg by mouth 4 (four) times daily as needed for anxiety.     amitriptyline (ELAVIL) 100 MG tablet Take 100 mg by mouth at bedtime.      aspirin 81 MG EC tablet Take 81 mg by mouth daily.     atorvastatin (LIPITOR) 80 MG tablet Take 80 mg by mouth daily.     citalopram (CELEXA) 40 MG tablet Take 40 mg by mouth daily.  11   dexamethasone (DECADRON) 4 MG tablet Please take 1 tablet twice a day the day before, the day of, and the day after treatment (Patient taking differently: Take 4 mg by mouth See admin instructions. Please take 1 tablet twice a day the day before, the day of, and the day after treatment) 40 tablet 2   folic acid (FOLVITE) 1 MG tablet Take 1 tablet (1 mg total) by mouth daily. 30 tablet 2   HYDROcodone-acetaminophen (NORCO) 10-325 MG tablet Take 1 tablet by mouth every 4 (four) hours as needed for moderate pain. 20 tablet 0   levothyroxine (SYNTHROID) 125 MCG tablet Take 125 mcg by mouth daily.     lidocaine-prilocaine (EMLA) cream Apply 1 application topically as needed. (Patient taking differently: Apply 1 application topically daily as needed (port access).) 30 g 0   midodrine (PROAMATINE) 10 MG tablet Take 1 tablet (10 mg total) by mouth 3 (three) times daily with meals. 90 tablet 1   nitroGLYCERIN (NITROSTAT) 0.4 MG SL tablet Place 1 tablet (0.4 mg total) under the tongue every 5 (five) minutes x 3 doses as needed for chest pain. (Patient not taking: Reported on 10/14/2021) 25 tablet 2   prochlorperazine (COMPAZINE) 10 MG tablet Take 1 tablet (10 mg total) by mouth every 6 (six) hours as needed for nausea or vomiting. (Patient not taking: Reported on 10/14/2021) 30 tablet 2   sucralfate (CARAFATE) 1 g tablet Take 1 tablet  (1 g total) by mouth 4 (four) times daily -  with meals and at bedtime. 5 min before meals for radiation induced esophagitis (Patient taking differently: Take 1 g by mouth daily.) 120 tablet 2   tamsulosin (FLOMAX) 0.4 MG CAPS capsule Take 0.4 mg by mouth daily.     TRELEGY ELLIPTA 100-62.5-25 MCG/INH AEPB Inhale 1 puff into the lungs daily.     No current facility-administered medications for this visit.    SURGICAL HISTORY:  Past Surgical History:  Procedure Laterality Date   AORTOGRAM  08/08/2009   for LLE claudication     By Dr. 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   BIOPSY N/A 05/30/2014   Procedure: BIOPSY;  Surgeon: [name], MD;  Location: [facility];  Service: Endoscopy;  Laterality: N/A;   BLADDER TUMOR EXCISION  8/812   CARDIAC CATHETERIZATION N/A 05/26/2016   Procedure: Left Heart Cath and Coronary Angiography;  Surgeon: [name], MD;  Location:  [facility];  Service: Cardiovascular;  Laterality: N/A;   CARDIAC CATHETERIZATION N/A 05/26/2016   Procedure: Coronary Stent Intervention;  Surgeon: [name], MD;  Location: [facility];  Service: Cardiovascular;  Laterality: N/A;   COLONOSCOPY WITH PROPOFOL N/A 05/30/2014   VXY:IAXKPV colonic polyp-likely source of hematochezia-removed as described above   ESOPHAGOGASTRODUODENOSCOPY (EGD) WITH PROPOFOL N/A 05/30/2014   VZS:MOLMBE and bulbar erosions s/p gastric biopsy. No evidence of portal gastropathy on today's examination.   FEMORAL-TIBIAL BYPASS GRAFT  2009   Right side using non-reversed GSV   By Dr. Anson [place] BYPASS GRAFT  11/24/2007   Right femoral to anterior tibial BPG   by Dr. Oneida Alar   FINGER SURGERY Left    "straightened my pinky"   HAND TENDON SURGERY Left 2013   Left 5th finger   HERNIA REPAIR     "stomach"   ICD IMPLANT N/A 03/02/2018   Procedure: ICD IMPLANT;  Surgeon: [name], MD;  Location: [facility];  Service: Cardiovascular;  Laterality:  N/A;   INCISIONAL HERNIA REPAIR N/A 12/25/2014   Procedure: HERNIA REPAIR INCISIONAL WITH MESH;  Surgeon: [name], MD;  Location: [facility];  Service: General;  Laterality: N/A;   INSERTION OF MESH N/A 12/25/2014   Procedure: INSERTION OF MESH;  Surgeon: [name], MD;  Location: [facility];  Service: General;  Laterality: N/A;   IR IMAGING GUIDED PORT INSERTION  04/11/2018   LAPAROSCOPIC CHOLECYSTECTOMY     LOWER EXTREMITY ANGIOGRAM Left 12/30/2015   Procedure: Lower Extremity Angiogram;  Surgeon: [name], MD;  Location: [facility];  Service: Cardiovascular;  Laterality: Left;   PERIPHERAL VASCULAR CATHETERIZATION N/A 12/30/2015   Procedure: Abdominal Aortogram;  Surgeon: [name], MD;  Location: [facility];  Service: Cardiovascular;  Laterality: N/A;   POLYPECTOMY  05/30/2014   Procedure: POLYPECTOMY;  Surgeon: [name], MD;  Location: [facility];  Service: Endoscopy;;   TONSILLECTOMY AND ADENOIDECTOMY  ~ 1970   TYMPANOSTOMY TUBE PLACEMENT Bilateral ~ [place] Left 10/04/2021   Procedure: PERICARDIAL WINDOW VIDEO ASSISTED THORACOSCOPY APPROACH;  Surgeon: Lajuana Matte, MD;  Location: [facility];  Service: Thoracic;  Laterality: Left;  pericardial window.  full lateral    REVIEW OF SYSTEMS:  Constitutional: positive for fatigue Eyes: negative Ears, nose, mouth, throat, and face: negative Respiratory: negative Cardiovascular: negative Gastrointestinal: negative Genitourinary:negative Integument/breast: negative Hematologic/lymphatic: negative Musculoskeletal:negative Neurological: negative Behavioral/Psych: negative Endocrine: negative Allergic/Immunologic: negative   PHYSICAL EXAMINATION: General appearance: alert, cooperative, fatigued, and no distress Head: Normocephalic, without obvious abnormality, atraumatic Neck: no adenopathy, no JVD, supple, symmetrical, trachea midline, and thyroid not enlarged, symmetric, no  tenderness/mass/nodules Lymph nodes: Cervical, supraclavicular, and axillary nodes normal. Resp: clear to auscultation bilaterally Back: symmetric, no curvature. ROM normal. No CVA tenderness. Cardio: regular rate and rhythm, S1, S2 normal, no murmur, click, rub or gallop GI: soft, non-tender; bowel sounds normal; no masses,  no organomegaly Extremities: Right below knee amputation otherwise no edema. Neurologic: Alert and oriented X 3, normal strength and tone. Normal symmetric reflexes. Normal coordination and gait  ECOG PERFORMANCE STATUS: 1 - Symptomatic but completely ambulatory  Blood pressure 104/81, pulse 95, temperature 98.1 F (36.7 C), temperature source Oral, resp. rate 15, height 6' (1.829 m), weight 192 lb (87.1 kg), SpO2 98 %.  LABORATORY DATA: Lab Results  Component Value Date   WBC 7.0 07/26/2022   HGB 14.4  07/26/2022   HCT 42.7 07/26/2022   MCV 90.7 07/26/2022   PLT 254 07/26/2022      Chemistry      Component Value Date/Time   NA 135 07/26/2022 1349   NA 139 02/27/2018 1457   K 3.7 07/26/2022 1349   CL 98 07/26/2022 1349   CO2 32 07/26/2022 1349   BUN 7 07/26/2022 1349   BUN 7 02/27/2018 1457   CREATININE 1.01 07/26/2022 1349   CREATININE 1.13 06/28/2016 1453      Component Value Date/Time   CALCIUM 8.8 (L) 07/26/2022 1349   ALKPHOS 132 (H) 07/26/2022 1349   AST 15 07/26/2022 1349   ALT 14 07/26/2022 1349   BILITOT 0.5 07/26/2022 1349       RADIOGRAPHIC STUDIES: CT Chest W Contrast  Result Date: 07/28/2022 CLINICAL DATA:  Non-small cell lung cancer.  * Tracking Code: BO * EXAM: CT CHEST, ABDOMEN, AND PELVIS WITH CONTRAST TECHNIQUE: Multidetector CT imaging of the chest, abdomen and pelvis was performed following the standard protocol during bolus administration of intravenous contrast. RADIATION DOSE REDUCTION: This exam was performed according to the departmental dose-optimization program which includes automated exposure control, adjustment of  the mA and/or kV according to patient size and/or use of iterative reconstruction technique. CONTRAST:  125m OMNIPAQUE IOHEXOL 300 MG/ML  SOLN COMPARISON:  01/08/2022. FINDINGS: CT CHEST FINDINGS Cardiovascular: Right IJ Port-A-Cath terminates in the right atrium. Atherosclerotic calcification of the aorta and coronary arteries. Heart size normal. No pericardial effusion. Mediastinum/Nodes: No pathologically enlarged mediastinal, hilar or axillary lymph nodes. Esophagus is grossly unremarkable. Lungs/Pleura: Post treatment consolidation, bronchiectasis and cavitation in the upper right hemithorax, unchanged. Centrilobular and paraseptal emphysema. 5 mm left upper lobe ground-glass nodule (505/52), unchanged. There are 3 tiny nodules in the left lung which appear new from 01/08/2022: 3 mm nodule left upper lobe (505/57) and 3 mm nodule left upper lobe (505/69). Similar subpleural lymph node along the left major fissure. 2 mm nodule in the left lower lobe (505/80), stable. Calcified granulomas. Small loculated fibrothorax in the apex of the right hemithorax. Small fibrothorax in the lower left hemithorax with adjacent pleuroparenchymal scarring. Musculoskeletal: No worrisome lytic or sclerotic lesions. CT ABDOMEN PELVIS FINDINGS Hepatobiliary: Liver is unremarkable. Cholecystectomy. No biliary ductal dilatation. Pancreas: Negative. Spleen: Negative. Adrenals/Urinary Tract: Adrenal glands are unremarkable. Subcentimeter low-attenuation lesion in the right kidney, too small to characterize. No specific follow-up necessary. Kidneys are otherwise unremarkable. Ureters are decompressed. Bladder may be minimally thick-walled anterolaterally. Stomach/Bowel: Stomach, small bowel, appendix and colon are unremarkable. Vascular/Lymphatic: Atherosclerotic calcification of the aorta. Left external iliac artery stent. 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 No  pathologically enlarged lymph nodes. Reproductive: Prostate is atrophic or absent. Other: No free fluid. Mesenteries and peritoneum are unremarkable. Right inguinal hernia contains fat. Musculoskeletal: Degenerative changes in the spine. No worrisome lytic or sclerotic lesions. IMPRESSION: 1. Two tiny left upper lobe nodules, new from 01/08/2022. Metastatic disease is a concern. Recommend continued attention on follow-up. 2. Post treatment consolidation, bronchiectasis and cavitation in the upper right hemithorax with a tiny right fibrothorax, stable. 3. Small left fibrothorax with adjacent pleuroparenchymal scarring, as before. 4. Aortic atherosclerosis (ICD10-I70.0). Coronary artery calcification. 5.  Emphysema (ICD10-J43.9). Electronically Signed   By: MLorin PicketM.D.   On: 07/28/2022 13:29   CT Abdomen Pelvis W Contrast  Result Date: 07/28/2022 CLINICAL DATA:  Non-small cell lung cancer.  * Tracking Code: BO * EXAM: CT CHEST,  ABDOMEN, AND PELVIS WITH CONTRAST TECHNIQUE: Multidetector CT imaging of the chest, abdomen and pelvis was performed following the standard protocol during bolus administration of intravenous contrast. RADIATION DOSE REDUCTION: This exam was performed according to the departmental dose-optimization program which includes automated exposure control, adjustment of the mA and/or kV according to patient size and/or use of iterative reconstruction technique. CONTRAST:  151m OMNIPAQUE IOHEXOL 300 MG/ML  SOLN COMPARISON:  01/08/2022. FINDINGS: CT CHEST FINDINGS Cardiovascular: Right IJ Port-A-Cath terminates in the right atrium. Atherosclerotic calcification of the aorta and coronary arteries. Heart size normal. No pericardial effusion. Mediastinum/Nodes: No pathologically enlarged mediastinal, hilar or axillary lymph nodes. Esophagus is grossly unremarkable. Lungs/Pleura: Post treatment consolidation, bronchiectasis and cavitation in the upper right hemithorax, unchanged. Centrilobular  and paraseptal emphysema. 5 mm left upper lobe ground-glass nodule (505/52), unchanged. There are 3 tiny nodules in the left lung which appear new from 01/08/2022: 3 mm nodule left upper lobe (505/57) and 3 mm nodule left upper lobe (505/69). Similar subpleural lymph node along the left major fissure. 2 mm nodule in the left lower lobe (505/80), stable. Calcified granulomas. Small loculated fibrothorax in the apex of the right hemithorax. Small fibrothorax in the lower left hemithorax with adjacent pleuroparenchymal scarring. Musculoskeletal: No worrisome lytic or sclerotic lesions. CT ABDOMEN PELVIS FINDINGS Hepatobiliary: Liver is unremarkable. Cholecystectomy. No biliary ductal dilatation. Pancreas: Negative. Spleen: Negative. Adrenals/Urinary Tract: Adrenal glands are unremarkable. Subcentimeter low-attenuation lesion in the right kidney, too small to characterize. No specific follow-up necessary. Kidneys are otherwise unremarkable. Ureters are decompressed. Bladder may be minimally thick-walled anterolaterally. Stomach/Bowel: Stomach, small bowel, appendix and colon are unremarkable. Vascular/Lymphatic: Atherosclerotic calcification of the aorta. Left external iliac artery stent. 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 No pathologically enlarged lymph nodes. Reproductive: Prostate is atrophic or absent. Other: No free fluid. Mesenteries and peritoneum are unremarkable. Right inguinal hernia contains fat. Musculoskeletal: Degenerative changes in the spine. No worrisome lytic or sclerotic lesions. IMPRESSION: 1. Two tiny left upper lobe nodules, new from 01/08/2022. Metastatic disease is a concern. Recommend continued attention on follow-up. 2. Post treatment consolidation, bronchiectasis and cavitation in the upper right hemithorax with a tiny right fibrothorax, stable. 3. Small left fibrothorax with adjacent pleuroparenchymal scarring, as before. 4.  Aortic atherosclerosis (ICD10-I70.0). Coronary artery calcification. 5.  Emphysema (ICD10-J43.9). Electronically Signed   By: MLorin PicketM.D.   On: 07/28/2022 13:29    ASSESSMENT AND PLAN: This is a very pleasant 61years old white male recently diagnosed with a stage IIIa non-small cell lung cancer, adenosquamous carcinoma.  He underwent a course of concurrent chemoradiation with weekly carboplatin and paclitaxel status post 6 cycles with partial response.  He has no actionable mutation but PD-L1 expression is 99%. The patient completed treatment with consolidation immunotherapy with Imfinzi status post 26 cycles.  He tolerated this treatment well with no concerning adverse effects. The patient was on observation for several months but he developed disease progression. He underwent treatment with systemic immunotherapy with Keytruda 200 mg IV every 3 weeks status post 12 cycles.  He missed a few cycles of his treatment [place].   He tolerated this treatment well but unfortunately had evidence for disease progression. He started systemic chemotherapy with carboplatin for AUC of 5 and Alimta 500 Mg/M2 status post 10 cycles.  Starting from cycle #6 the patient is on single agent treatment with Alimta 400 Mg/M2 every  3 weeks.  He has been tolerating this treatment well with no concerning adverse effect except for occasional nausea and fatigue.  His treatment is currently on hold secondary to toxicity and intolerance. The patient was lost to follow-up for several months but he is back here today for evaluation with repeat CT scan of the chest. I personally and independently reviewed the scan images and discussed the result with the patient and his wife. His scan showed no concerning findings for disease progression except for 2 tiny left upper lobe nodule measuring around 3 mm in size. I recommended for the patient to continue on observation with repeat CT  scan of the chest in 3 months. He was advised to call immediately if he has any other concerning symptoms in the interval. For the hypertension, he took a lot of medication earlier today including Xanax.  He will monitor it closely at home. For the hypothyroidism, he will continue his current treatment with levothyroxine.  The patient voices understanding of current disease status and treatment options and is in agreement with the current care plan. All questions were answered. The patient knows to call the clinic with any problems, questions or concerns. We can certainly see the patient much sooner if necessary.  The total time spent in the appointment was 30 minutes.  Disclaimer: This note was dictated with voice recognition software. Similar sounding words can inadvertently be transcribed and may not be corrected upon review.

## 2022-08-11 DIAGNOSIS — Z0001 Encounter for general adult medical examination with abnormal findings: Secondary | ICD-10-CM | POA: Diagnosis not present

## 2022-08-11 DIAGNOSIS — J449 Chronic obstructive pulmonary disease, unspecified: Secondary | ICD-10-CM | POA: Diagnosis not present

## 2022-08-11 DIAGNOSIS — G894 Chronic pain syndrome: Secondary | ICD-10-CM | POA: Diagnosis not present

## 2022-08-11 DIAGNOSIS — Z89511 Acquired absence of right leg below knee: Secondary | ICD-10-CM | POA: Diagnosis not present

## 2022-08-11 DIAGNOSIS — I10 Essential (primary) hypertension: Secondary | ICD-10-CM | POA: Diagnosis not present

## 2022-08-11 DIAGNOSIS — C3490 Malignant neoplasm of unspecified part of unspecified bronchus or lung: Secondary | ICD-10-CM | POA: Diagnosis not present

## 2022-09-01 ENCOUNTER — Ambulatory Visit (INDEPENDENT_AMBULATORY_CARE_PROVIDER_SITE_OTHER): Payer: Medicare Other

## 2022-09-01 DIAGNOSIS — I255 Ischemic cardiomyopathy: Secondary | ICD-10-CM

## 2022-09-01 LAB — CUP PACEART REMOTE DEVICE CHECK
Battery Remaining Longevity: 94 mo
Battery Voltage: 2.96 V
Brady Statistic RV Percent Paced: 0.01 %
Date Time Interrogation Session: 20231206033423
HighPow Impedance: 71 Ohm
Implantable Lead Connection Status: 753985
Implantable Lead Implant Date: 20190606
Implantable Lead Location: 753860
Implantable Pulse Generator Implant Date: 20190606
Lead Channel Impedance Value: 285 Ohm
Lead Channel Impedance Value: 342 Ohm
Lead Channel Pacing Threshold Amplitude: 0.875 V
Lead Channel Pacing Threshold Pulse Width: 0.4 ms
Lead Channel Sensing Intrinsic Amplitude: 14.125 mV
Lead Channel Sensing Intrinsic Amplitude: 14.125 mV
Lead Channel Setting Pacing Amplitude: 2.5 V
Lead Channel Setting Pacing Pulse Width: 0.4 ms
Lead Channel Setting Sensing Sensitivity: 0.3 mV
Zone Setting Status: 755011
Zone Setting Status: 755011

## 2022-09-07 DIAGNOSIS — G894 Chronic pain syndrome: Secondary | ICD-10-CM | POA: Diagnosis not present

## 2022-09-07 DIAGNOSIS — C3490 Malignant neoplasm of unspecified part of unspecified bronchus or lung: Secondary | ICD-10-CM | POA: Diagnosis not present

## 2022-09-24 NOTE — Progress Notes (Signed)
Remote ICD transmission.   

## 2022-09-25 ENCOUNTER — Other Ambulatory Visit: Payer: Self-pay

## 2022-10-06 DIAGNOSIS — I10 Essential (primary) hypertension: Secondary | ICD-10-CM | POA: Diagnosis not present

## 2022-10-06 DIAGNOSIS — G894 Chronic pain syndrome: Secondary | ICD-10-CM | POA: Diagnosis not present

## 2022-10-21 ENCOUNTER — Telehealth: Payer: Self-pay | Admitting: Internal Medicine

## 2022-10-21 NOTE — Telephone Encounter (Signed)
Called patient regarding upcoming February appointments, patient has no voicemail set up on both phones. Calendar will be mailed.

## 2022-10-26 ENCOUNTER — Telehealth: Payer: Self-pay | Admitting: Internal Medicine

## 2022-10-26 NOTE — Telephone Encounter (Signed)
Rescheduled 02/06 appointment due to provider on-call, contacted the patient several times regarding rescheduled appointment. No voicemail box set up, calendar will be mailed.

## 2022-10-29 ENCOUNTER — Other Ambulatory Visit: Payer: Self-pay

## 2022-10-29 ENCOUNTER — Inpatient Hospital Stay: Payer: Medicare Other

## 2022-10-29 ENCOUNTER — Inpatient Hospital Stay: Payer: Medicare Other | Attending: Physician Assistant

## 2022-10-29 DIAGNOSIS — C3411 Malignant neoplasm of upper lobe, right bronchus or lung: Secondary | ICD-10-CM | POA: Diagnosis not present

## 2022-10-29 DIAGNOSIS — Z95828 Presence of other vascular implants and grafts: Secondary | ICD-10-CM

## 2022-10-29 DIAGNOSIS — C3490 Malignant neoplasm of unspecified part of unspecified bronchus or lung: Secondary | ICD-10-CM

## 2022-10-29 DIAGNOSIS — C3491 Malignant neoplasm of unspecified part of right bronchus or lung: Secondary | ICD-10-CM

## 2022-10-29 LAB — CMP (CANCER CENTER ONLY)
ALT: 17 U/L (ref 0–44)
AST: 17 U/L (ref 15–41)
Albumin: 3.7 g/dL (ref 3.5–5.0)
Alkaline Phosphatase: 110 U/L (ref 38–126)
Anion gap: 5 (ref 5–15)
BUN: 8 mg/dL (ref 6–20)
CO2: 30 mmol/L (ref 22–32)
Calcium: 8.9 mg/dL (ref 8.9–10.3)
Chloride: 100 mmol/L (ref 98–111)
Creatinine: 0.92 mg/dL (ref 0.61–1.24)
GFR, Estimated: >60 mL/min (ref >60)
Glucose, Bld: 88 mg/dL (ref 70–99)
Potassium: 3.3 mmol/L — ABNORMAL LOW (ref 3.5–5.1)
Sodium: 135 mmol/L (ref 135–145)
Total Bilirubin: 0.5 mg/dL (ref 0.3–1.2)
Total Protein: 6.6 g/dL (ref 6.5–8.1)

## 2022-10-29 LAB — CBC WITH DIFFERENTIAL (CANCER CENTER ONLY)
Abs Immature Granulocytes: 0.17 10*3/uL — ABNORMAL HIGH (ref 0.00–0.07)
Basophils Absolute: 0.1 10*3/uL (ref 0.0–0.1)
Basophils Relative: 1 %
Eosinophils Absolute: 0 10*3/uL (ref 0.0–0.5)
Eosinophils Relative: 0 %
HCT: 42.6 % (ref 39.0–52.0)
Hemoglobin: 14.4 g/dL (ref 13.0–17.0)
Immature Granulocytes: 2 %
Lymphocytes Relative: 8 %
Lymphs Abs: 0.8 10*3/uL (ref 0.7–4.0)
MCH: 29.9 pg (ref 26.0–34.0)
MCHC: 33.8 g/dL (ref 30.0–36.0)
MCV: 88.4 fL (ref 80.0–100.0)
Monocytes Absolute: 0.9 10*3/uL (ref 0.1–1.0)
Monocytes Relative: 9 %
Neutro Abs: 7.6 10*3/uL (ref 1.7–7.7)
Neutrophils Relative %: 80 %
Platelet Count: 249 10*3/uL (ref 150–400)
RBC: 4.82 MIL/uL (ref 4.22–5.81)
RDW: 14 % (ref 11.5–15.5)
WBC Count: 9.5 10*3/uL (ref 4.0–10.5)
nRBC: 0 % (ref 0.0–0.2)

## 2022-10-29 MED ORDER — SODIUM CHLORIDE 0.9% FLUSH
10.0000 mL | Freq: Once | INTRAVENOUS | Status: AC
  Administered 2022-10-29: 10 mL

## 2022-10-29 MED ORDER — HEPARIN SOD (PORK) LOCK FLUSH 100 UNIT/ML IV SOLN
500.0000 [IU] | Freq: Once | INTRAVENOUS | Status: AC
  Administered 2022-10-29: 500 [IU]

## 2022-11-02 ENCOUNTER — Ambulatory Visit: Payer: Medicare Other | Admitting: Internal Medicine

## 2022-11-03 ENCOUNTER — Telehealth: Payer: Self-pay | Admitting: Internal Medicine

## 2022-11-03 NOTE — Telephone Encounter (Signed)
Called patient regarding upcoming February appointments, spoke with patient's daughter. Patient will be notified.

## 2022-11-04 DIAGNOSIS — G894 Chronic pain syndrome: Secondary | ICD-10-CM | POA: Diagnosis not present

## 2022-11-05 ENCOUNTER — Ambulatory Visit (HOSPITAL_COMMUNITY)
Admission: RE | Admit: 2022-11-05 | Discharge: 2022-11-05 | Disposition: A | Discharge status: Home / Self Care | Payer: Medicare Other | Source: Ambulatory Visit | Attending: Internal Medicine | Admitting: Internal Medicine

## 2022-11-05 ENCOUNTER — Encounter (HOSPITAL_COMMUNITY): Payer: Self-pay

## 2022-11-05 DIAGNOSIS — J90 Pleural effusion, not elsewhere classified: Secondary | ICD-10-CM | POA: Diagnosis not present

## 2022-11-05 DIAGNOSIS — C3490 Malignant neoplasm of unspecified part of unspecified bronchus or lung: Secondary | ICD-10-CM | POA: Insufficient documentation

## 2022-11-05 MED ORDER — SODIUM CHLORIDE (PF) 0.9 % IJ SOLN
INTRAMUSCULAR | Status: AC
  Filled 2022-11-05: qty 50

## 2022-11-05 MED ORDER — HEPARIN SOD (PORK) LOCK FLUSH 100 UNIT/ML IV SOLN
500.0000 [IU] | Freq: Once | INTRAVENOUS | Status: AC
  Administered 2022-11-05: 500 [IU] via INTRAVENOUS

## 2022-11-05 MED ORDER — HEPARIN SOD (PORK) LOCK FLUSH 100 UNIT/ML IV SOLN
INTRAVENOUS | Status: AC
  Filled 2022-11-05: qty 5

## 2022-11-05 MED ORDER — IOHEXOL 300 MG/ML  SOLN
75.0000 mL | Freq: Once | INTRAMUSCULAR | Status: AC | PRN
  Administered 2022-11-05: 75 mL via INTRAVENOUS

## 2022-11-08 ENCOUNTER — Inpatient Hospital Stay: Payer: Medicare Other | Admitting: Internal Medicine

## 2022-11-10 ENCOUNTER — Ambulatory Visit: Payer: Medicare Other | Admitting: Internal Medicine

## 2022-11-29 DIAGNOSIS — C3490 Malignant neoplasm of unspecified part of unspecified bronchus or lung: Secondary | ICD-10-CM | POA: Diagnosis not present

## 2022-11-29 DIAGNOSIS — G894 Chronic pain syndrome: Secondary | ICD-10-CM | POA: Diagnosis not present

## 2022-11-29 DIAGNOSIS — I739 Peripheral vascular disease, unspecified: Secondary | ICD-10-CM | POA: Diagnosis not present

## 2022-11-29 DIAGNOSIS — Z89511 Acquired absence of right leg below knee: Secondary | ICD-10-CM | POA: Diagnosis not present

## 2022-11-30 ENCOUNTER — Telehealth: Payer: Self-pay | Admitting: Internal Medicine

## 2022-11-30 ENCOUNTER — Telehealth: Payer: Self-pay | Admitting: Medical Oncology

## 2022-11-30 NOTE — Telephone Encounter (Signed)
Schedule message sent. 

## 2022-11-30 NOTE — Telephone Encounter (Signed)
Scheduled per 03/05 scheduled message, patient has been called and notified of upcoming appointments.

## 2022-12-01 ENCOUNTER — Ambulatory Visit: Payer: Medicare Other

## 2022-12-01 DIAGNOSIS — I255 Ischemic cardiomyopathy: Secondary | ICD-10-CM

## 2022-12-01 LAB — CUP PACEART REMOTE DEVICE CHECK
Battery Remaining Longevity: 90 mo
Battery Voltage: 2.96 V
Brady Statistic RV Percent Paced: 0.01 %
Date Time Interrogation Session: 20240306001604
HighPow Impedance: 72 Ohm
Implantable Lead Connection Status: 753985
Implantable Lead Implant Date: 20190606
Implantable Lead Location: 753860
Implantable Pulse Generator Implant Date: 20190606
Lead Channel Impedance Value: 304 Ohm
Lead Channel Impedance Value: 342 Ohm
Lead Channel Pacing Threshold Amplitude: 1 V
Lead Channel Pacing Threshold Pulse Width: 0.4 ms
Lead Channel Sensing Intrinsic Amplitude: 14.125 mV
Lead Channel Sensing Intrinsic Amplitude: 14.125 mV
Lead Channel Setting Pacing Amplitude: 2.5 V
Lead Channel Setting Pacing Pulse Width: 0.4 ms
Lead Channel Setting Sensing Sensitivity: 0.3 mV
Zone Setting Status: 755011
Zone Setting Status: 755011

## 2022-12-17 ENCOUNTER — Other Ambulatory Visit: Payer: Self-pay

## 2022-12-17 DIAGNOSIS — C3490 Malignant neoplasm of unspecified part of unspecified bronchus or lung: Secondary | ICD-10-CM

## 2022-12-21 ENCOUNTER — Inpatient Hospital Stay: Payer: Medicare Other | Admitting: Internal Medicine

## 2022-12-21 ENCOUNTER — Inpatient Hospital Stay: Payer: Medicare Other | Attending: Physician Assistant

## 2022-12-28 DIAGNOSIS — I10 Essential (primary) hypertension: Secondary | ICD-10-CM | POA: Diagnosis not present

## 2022-12-28 DIAGNOSIS — Z89511 Acquired absence of right leg below knee: Secondary | ICD-10-CM | POA: Diagnosis not present

## 2022-12-28 DIAGNOSIS — G894 Chronic pain syndrome: Secondary | ICD-10-CM | POA: Diagnosis not present

## 2022-12-29 ENCOUNTER — Other Ambulatory Visit (HOSPITAL_COMMUNITY): Payer: Self-pay | Admitting: Internal Medicine

## 2022-12-29 DIAGNOSIS — M79605 Pain in left leg: Secondary | ICD-10-CM

## 2023-01-05 NOTE — Progress Notes (Signed)
Remote ICD transmission.   

## 2023-02-08 DIAGNOSIS — I700 Atherosclerosis of aorta: Secondary | ICD-10-CM | POA: Diagnosis not present

## 2023-02-08 DIAGNOSIS — Z89511 Acquired absence of right leg below knee: Secondary | ICD-10-CM | POA: Diagnosis not present

## 2023-02-08 DIAGNOSIS — I5020 Unspecified systolic (congestive) heart failure: Secondary | ICD-10-CM | POA: Diagnosis not present

## 2023-02-08 DIAGNOSIS — I739 Peripheral vascular disease, unspecified: Secondary | ICD-10-CM | POA: Diagnosis not present

## 2023-02-08 DIAGNOSIS — G546 Phantom limb syndrome with pain: Secondary | ICD-10-CM | POA: Diagnosis not present

## 2023-02-08 DIAGNOSIS — I10 Essential (primary) hypertension: Secondary | ICD-10-CM | POA: Diagnosis not present

## 2023-02-08 DIAGNOSIS — C3490 Malignant neoplasm of unspecified part of unspecified bronchus or lung: Secondary | ICD-10-CM | POA: Diagnosis not present

## 2023-02-08 DIAGNOSIS — G894 Chronic pain syndrome: Secondary | ICD-10-CM | POA: Diagnosis not present

## 2023-03-01 DIAGNOSIS — G546 Phantom limb syndrome with pain: Secondary | ICD-10-CM | POA: Diagnosis not present

## 2023-03-01 DIAGNOSIS — I10 Essential (primary) hypertension: Secondary | ICD-10-CM | POA: Diagnosis not present

## 2023-03-01 DIAGNOSIS — G894 Chronic pain syndrome: Secondary | ICD-10-CM | POA: Diagnosis not present

## 2023-03-01 DIAGNOSIS — I739 Peripheral vascular disease, unspecified: Secondary | ICD-10-CM | POA: Diagnosis not present

## 2023-03-01 DIAGNOSIS — C3490 Malignant neoplasm of unspecified part of unspecified bronchus or lung: Secondary | ICD-10-CM | POA: Diagnosis not present

## 2023-03-02 ENCOUNTER — Ambulatory Visit (INDEPENDENT_AMBULATORY_CARE_PROVIDER_SITE_OTHER): Payer: Medicare Other

## 2023-03-02 DIAGNOSIS — I255 Ischemic cardiomyopathy: Secondary | ICD-10-CM

## 2023-03-02 LAB — CUP PACEART REMOTE DEVICE CHECK
Battery Remaining Longevity: 86 mo
Battery Voltage: 2.95 V
Brady Statistic RV Percent Paced: 0.01 %
Date Time Interrogation Session: 20240605033322
HighPow Impedance: 76 Ohm
Implantable Lead Connection Status: 753985
Implantable Lead Implant Date: 20190606
Implantable Lead Location: 753860
Implantable Pulse Generator Implant Date: 20190606
Lead Channel Impedance Value: 285 Ohm
Lead Channel Impedance Value: 342 Ohm
Lead Channel Pacing Threshold Amplitude: 1 V
Lead Channel Pacing Threshold Pulse Width: 0.4 ms
Lead Channel Sensing Intrinsic Amplitude: 14.125 mV
Lead Channel Sensing Intrinsic Amplitude: 14.125 mV
Lead Channel Setting Pacing Amplitude: 2.5 V
Lead Channel Setting Pacing Pulse Width: 0.4 ms
Lead Channel Setting Sensing Sensitivity: 0.3 mV
Zone Setting Status: 755011
Zone Setting Status: 755011

## 2023-03-29 DIAGNOSIS — I739 Peripheral vascular disease, unspecified: Secondary | ICD-10-CM | POA: Diagnosis not present

## 2023-03-29 DIAGNOSIS — Z89511 Acquired absence of right leg below knee: Secondary | ICD-10-CM | POA: Diagnosis not present

## 2023-03-29 DIAGNOSIS — I5020 Unspecified systolic (congestive) heart failure: Secondary | ICD-10-CM | POA: Diagnosis not present

## 2023-03-29 DIAGNOSIS — G546 Phantom limb syndrome with pain: Secondary | ICD-10-CM | POA: Diagnosis not present

## 2023-03-29 DIAGNOSIS — J449 Chronic obstructive pulmonary disease, unspecified: Secondary | ICD-10-CM | POA: Diagnosis not present

## 2023-03-29 DIAGNOSIS — C3490 Malignant neoplasm of unspecified part of unspecified bronchus or lung: Secondary | ICD-10-CM | POA: Diagnosis not present

## 2023-03-29 NOTE — Progress Notes (Signed)
Remote ICD transmission.   

## 2023-05-06 DIAGNOSIS — I5020 Unspecified systolic (congestive) heart failure: Secondary | ICD-10-CM | POA: Diagnosis not present

## 2023-05-06 DIAGNOSIS — J449 Chronic obstructive pulmonary disease, unspecified: Secondary | ICD-10-CM | POA: Diagnosis not present

## 2023-05-06 DIAGNOSIS — I10 Essential (primary) hypertension: Secondary | ICD-10-CM | POA: Diagnosis not present

## 2023-05-06 DIAGNOSIS — C3490 Malignant neoplasm of unspecified part of unspecified bronchus or lung: Secondary | ICD-10-CM | POA: Diagnosis not present

## 2023-05-06 DIAGNOSIS — G894 Chronic pain syndrome: Secondary | ICD-10-CM | POA: Diagnosis not present

## 2023-05-06 DIAGNOSIS — G546 Phantom limb syndrome with pain: Secondary | ICD-10-CM | POA: Diagnosis not present

## 2023-06-01 ENCOUNTER — Ambulatory Visit: Payer: Medicare Other

## 2023-06-10 DIAGNOSIS — Z888 Allergy status to other drugs, medicaments and biological substances status: Secondary | ICD-10-CM | POA: Diagnosis not present

## 2023-06-10 DIAGNOSIS — R739 Hyperglycemia, unspecified: Secondary | ICD-10-CM | POA: Diagnosis not present

## 2023-06-10 DIAGNOSIS — C3482 Malignant neoplasm of overlapping sites of left bronchus and lung: Secondary | ICD-10-CM | POA: Diagnosis not present

## 2023-06-10 DIAGNOSIS — I2510 Atherosclerotic heart disease of native coronary artery without angina pectoris: Secondary | ICD-10-CM | POA: Diagnosis not present

## 2023-06-10 DIAGNOSIS — I959 Hypotension, unspecified: Secondary | ICD-10-CM | POA: Diagnosis not present

## 2023-06-10 DIAGNOSIS — I70202 Unspecified atherosclerosis of native arteries of extremities, left leg: Secondary | ICD-10-CM | POA: Diagnosis not present

## 2023-06-10 DIAGNOSIS — I252 Old myocardial infarction: Secondary | ICD-10-CM | POA: Diagnosis not present

## 2023-06-10 DIAGNOSIS — I10 Essential (primary) hypertension: Secondary | ICD-10-CM | POA: Diagnosis not present

## 2023-06-10 DIAGNOSIS — Z79899 Other long term (current) drug therapy: Secondary | ICD-10-CM | POA: Diagnosis not present

## 2023-06-10 DIAGNOSIS — Z950 Presence of cardiac pacemaker: Secondary | ICD-10-CM | POA: Diagnosis not present

## 2023-06-10 DIAGNOSIS — Z91148 Patient's other noncompliance with medication regimen for other reason: Secondary | ICD-10-CM | POA: Diagnosis not present

## 2023-06-10 DIAGNOSIS — I2109 ST elevation (STEMI) myocardial infarction involving other coronary artery of anterior wall: Secondary | ICD-10-CM | POA: Diagnosis not present

## 2023-06-10 DIAGNOSIS — I255 Ischemic cardiomyopathy: Secondary | ICD-10-CM | POA: Diagnosis not present

## 2023-06-10 DIAGNOSIS — I517 Cardiomegaly: Secondary | ICD-10-CM | POA: Diagnosis not present

## 2023-06-10 DIAGNOSIS — I700 Atherosclerosis of aorta: Secondary | ICD-10-CM | POA: Diagnosis not present

## 2023-06-10 DIAGNOSIS — F17200 Nicotine dependence, unspecified, uncomplicated: Secondary | ICD-10-CM | POA: Diagnosis not present

## 2023-06-10 DIAGNOSIS — I70203 Unspecified atherosclerosis of native arteries of extremities, bilateral legs: Secondary | ICD-10-CM | POA: Diagnosis not present

## 2023-06-10 DIAGNOSIS — E785 Hyperlipidemia, unspecified: Secondary | ICD-10-CM | POA: Diagnosis not present

## 2023-06-10 DIAGNOSIS — Z7982 Long term (current) use of aspirin: Secondary | ICD-10-CM | POA: Diagnosis not present

## 2023-06-10 DIAGNOSIS — I5020 Unspecified systolic (congestive) heart failure: Secondary | ICD-10-CM | POA: Diagnosis not present

## 2023-06-10 DIAGNOSIS — E878 Other disorders of electrolyte and fluid balance, not elsewhere classified: Secondary | ICD-10-CM | POA: Diagnosis not present

## 2023-06-10 DIAGNOSIS — I70222 Atherosclerosis of native arteries of extremities with rest pain, left leg: Secondary | ICD-10-CM | POA: Diagnosis not present

## 2023-06-10 DIAGNOSIS — Z86718 Personal history of other venous thrombosis and embolism: Secondary | ICD-10-CM | POA: Diagnosis not present

## 2023-06-10 DIAGNOSIS — I998 Other disorder of circulatory system: Secondary | ICD-10-CM | POA: Diagnosis not present

## 2023-06-10 DIAGNOSIS — G9341 Metabolic encephalopathy: Secondary | ICD-10-CM | POA: Diagnosis not present

## 2023-06-10 DIAGNOSIS — I4581 Long QT syndrome: Secondary | ICD-10-CM | POA: Diagnosis not present

## 2023-06-10 DIAGNOSIS — I7092 Chronic total occlusion of artery of the extremities: Secondary | ICD-10-CM | POA: Diagnosis not present

## 2023-06-10 DIAGNOSIS — E876 Hypokalemia: Secondary | ICD-10-CM | POA: Diagnosis not present

## 2023-06-10 DIAGNOSIS — I745 Embolism and thrombosis of iliac artery: Secondary | ICD-10-CM | POA: Diagnosis not present

## 2023-06-10 DIAGNOSIS — J449 Chronic obstructive pulmonary disease, unspecified: Secondary | ICD-10-CM | POA: Diagnosis not present

## 2023-06-10 DIAGNOSIS — Z95828 Presence of other vascular implants and grafts: Secondary | ICD-10-CM | POA: Diagnosis not present

## 2023-06-10 DIAGNOSIS — M79673 Pain in unspecified foot: Secondary | ICD-10-CM | POA: Diagnosis not present

## 2023-06-10 DIAGNOSIS — I110 Hypertensive heart disease with heart failure: Secondary | ICD-10-CM | POA: Diagnosis not present

## 2023-06-10 DIAGNOSIS — R9431 Abnormal electrocardiogram [ECG] [EKG]: Secondary | ICD-10-CM | POA: Diagnosis not present

## 2023-06-10 DIAGNOSIS — Z95810 Presence of automatic (implantable) cardiac defibrillator: Secondary | ICD-10-CM | POA: Diagnosis not present

## 2023-06-10 DIAGNOSIS — R531 Weakness: Secondary | ICD-10-CM | POA: Diagnosis not present

## 2023-06-10 DIAGNOSIS — I5022 Chronic systolic (congestive) heart failure: Secondary | ICD-10-CM | POA: Diagnosis not present

## 2023-06-10 DIAGNOSIS — Z9861 Coronary angioplasty status: Secondary | ICD-10-CM | POA: Diagnosis not present

## 2023-06-10 DIAGNOSIS — I5189 Other ill-defined heart diseases: Secondary | ICD-10-CM | POA: Diagnosis not present

## 2023-06-10 DIAGNOSIS — I743 Embolism and thrombosis of arteries of the lower extremities: Secondary | ICD-10-CM | POA: Diagnosis not present

## 2023-06-10 DIAGNOSIS — E871 Hypo-osmolality and hyponatremia: Secondary | ICD-10-CM | POA: Diagnosis not present

## 2023-06-10 DIAGNOSIS — E039 Hypothyroidism, unspecified: Secondary | ICD-10-CM | POA: Diagnosis not present

## 2023-06-10 DIAGNOSIS — I9589 Other hypotension: Secondary | ICD-10-CM | POA: Diagnosis not present

## 2023-06-10 DIAGNOSIS — I3481 Nonrheumatic mitral (valve) annulus calcification: Secondary | ICD-10-CM | POA: Diagnosis not present

## 2023-06-10 DIAGNOSIS — Z955 Presence of coronary angioplasty implant and graft: Secondary | ICD-10-CM | POA: Diagnosis not present

## 2023-06-10 DIAGNOSIS — I70201 Unspecified atherosclerosis of native arteries of extremities, right leg: Secondary | ICD-10-CM | POA: Diagnosis not present

## 2023-06-10 DIAGNOSIS — F17210 Nicotine dependence, cigarettes, uncomplicated: Secondary | ICD-10-CM | POA: Diagnosis not present

## 2023-06-10 DIAGNOSIS — Z89511 Acquired absence of right leg below knee: Secondary | ICD-10-CM | POA: Diagnosis not present

## 2023-06-10 DIAGNOSIS — I3131 Malignant pericardial effusion in diseases classified elsewhere: Secondary | ICD-10-CM | POA: Diagnosis not present

## 2023-06-10 DIAGNOSIS — Z01810 Encounter for preprocedural cardiovascular examination: Secondary | ICD-10-CM | POA: Diagnosis not present

## 2023-06-10 DIAGNOSIS — I739 Peripheral vascular disease, unspecified: Secondary | ICD-10-CM | POA: Diagnosis not present

## 2023-06-16 ENCOUNTER — Ambulatory Visit (INDEPENDENT_AMBULATORY_CARE_PROVIDER_SITE_OTHER): Payer: Medicare Other

## 2023-06-16 DIAGNOSIS — F17210 Nicotine dependence, cigarettes, uncomplicated: Secondary | ICD-10-CM | POA: Diagnosis not present

## 2023-06-16 DIAGNOSIS — Z95820 Peripheral vascular angioplasty status with implants and grafts: Secondary | ICD-10-CM | POA: Diagnosis not present

## 2023-06-16 DIAGNOSIS — J440 Chronic obstructive pulmonary disease with acute lower respiratory infection: Secondary | ICD-10-CM | POA: Diagnosis not present

## 2023-06-16 DIAGNOSIS — R918 Other nonspecific abnormal finding of lung field: Secondary | ICD-10-CM | POA: Diagnosis not present

## 2023-06-16 DIAGNOSIS — E871 Hypo-osmolality and hyponatremia: Secondary | ICD-10-CM | POA: Diagnosis not present

## 2023-06-16 DIAGNOSIS — Z5329 Procedure and treatment not carried out because of patient's decision for other reasons: Secondary | ICD-10-CM | POA: Diagnosis not present

## 2023-06-16 DIAGNOSIS — Y95 Nosocomial condition: Secondary | ICD-10-CM | POA: Diagnosis not present

## 2023-06-16 DIAGNOSIS — E876 Hypokalemia: Secondary | ICD-10-CM | POA: Diagnosis not present

## 2023-06-16 DIAGNOSIS — I9589 Other hypotension: Secondary | ICD-10-CM | POA: Diagnosis not present

## 2023-06-16 DIAGNOSIS — Z95810 Presence of automatic (implantable) cardiac defibrillator: Secondary | ICD-10-CM | POA: Diagnosis not present

## 2023-06-16 DIAGNOSIS — R6521 Severe sepsis with septic shock: Secondary | ICD-10-CM | POA: Diagnosis not present

## 2023-06-16 DIAGNOSIS — I255 Ischemic cardiomyopathy: Secondary | ICD-10-CM

## 2023-06-16 DIAGNOSIS — G9341 Metabolic encephalopathy: Secondary | ICD-10-CM | POA: Diagnosis not present

## 2023-06-16 DIAGNOSIS — A419 Sepsis, unspecified organism: Secondary | ICD-10-CM | POA: Diagnosis not present

## 2023-06-16 DIAGNOSIS — K766 Portal hypertension: Secondary | ICD-10-CM | POA: Diagnosis not present

## 2023-06-16 DIAGNOSIS — I739 Peripheral vascular disease, unspecified: Secondary | ICD-10-CM | POA: Diagnosis not present

## 2023-06-16 DIAGNOSIS — E039 Hypothyroidism, unspecified: Secondary | ICD-10-CM | POA: Diagnosis not present

## 2023-06-16 DIAGNOSIS — R0602 Shortness of breath: Secondary | ICD-10-CM | POA: Diagnosis not present

## 2023-06-16 DIAGNOSIS — C3490 Malignant neoplasm of unspecified part of unspecified bronchus or lung: Secondary | ICD-10-CM | POA: Diagnosis not present

## 2023-06-16 DIAGNOSIS — I2510 Atherosclerotic heart disease of native coronary artery without angina pectoris: Secondary | ICD-10-CM | POA: Diagnosis not present

## 2023-06-16 DIAGNOSIS — Z1152 Encounter for screening for COVID-19: Secondary | ICD-10-CM | POA: Diagnosis not present

## 2023-06-16 DIAGNOSIS — D72829 Elevated white blood cell count, unspecified: Secondary | ICD-10-CM | POA: Diagnosis not present

## 2023-06-16 DIAGNOSIS — K7460 Unspecified cirrhosis of liver: Secondary | ICD-10-CM | POA: Diagnosis not present

## 2023-06-16 DIAGNOSIS — I5022 Chronic systolic (congestive) heart failure: Secondary | ICD-10-CM | POA: Diagnosis not present

## 2023-06-17 LAB — CUP PACEART REMOTE DEVICE CHECK
Battery Remaining Longevity: 76 mo
Battery Voltage: 2.97 V
Brady Statistic RV Percent Paced: 0 %
Date Time Interrogation Session: 20240919225127
HighPow Impedance: 88 Ohm
Implantable Lead Connection Status: 753985
Implantable Lead Implant Date: 20190606
Implantable Lead Location: 753860
Implantable Pulse Generator Implant Date: 20190606
Lead Channel Impedance Value: 304 Ohm
Lead Channel Impedance Value: 361 Ohm
Lead Channel Pacing Threshold Amplitude: 0.875 V
Lead Channel Pacing Threshold Pulse Width: 0.4 ms
Lead Channel Sensing Intrinsic Amplitude: 17 mV
Lead Channel Sensing Intrinsic Amplitude: 17 mV
Lead Channel Setting Pacing Amplitude: 2.5 V
Lead Channel Setting Pacing Pulse Width: 0.4 ms
Lead Channel Setting Sensing Sensitivity: 0.3 mV
Zone Setting Status: 755011
Zone Setting Status: 755011

## 2023-06-18 ENCOUNTER — Other Ambulatory Visit: Payer: Self-pay

## 2023-06-28 NOTE — Progress Notes (Signed)
Remote ICD transmission.   

## 2023-07-07 ENCOUNTER — Emergency Department (HOSPITAL_COMMUNITY): Payer: Medicare Other

## 2023-07-07 ENCOUNTER — Emergency Department (HOSPITAL_COMMUNITY)
Admission: EM | Admit: 2023-07-07 | Discharge: 2023-07-08 | Disposition: A | Discharge status: Home / Self Care | Payer: Medicare Other | Attending: Emergency Medicine | Admitting: Emergency Medicine

## 2023-07-07 ENCOUNTER — Other Ambulatory Visit: Payer: Self-pay

## 2023-07-07 DIAGNOSIS — E876 Hypokalemia: Secondary | ICD-10-CM | POA: Insufficient documentation

## 2023-07-07 DIAGNOSIS — Z85118 Personal history of other malignant neoplasm of bronchus and lung: Secondary | ICD-10-CM | POA: Insufficient documentation

## 2023-07-07 DIAGNOSIS — R42 Dizziness and giddiness: Secondary | ICD-10-CM | POA: Diagnosis present

## 2023-07-07 DIAGNOSIS — M25511 Pain in right shoulder: Secondary | ICD-10-CM | POA: Insufficient documentation

## 2023-07-07 DIAGNOSIS — M7989 Other specified soft tissue disorders: Secondary | ICD-10-CM | POA: Diagnosis not present

## 2023-07-07 DIAGNOSIS — Z7982 Long term (current) use of aspirin: Secondary | ICD-10-CM | POA: Diagnosis not present

## 2023-07-07 DIAGNOSIS — J45909 Unspecified asthma, uncomplicated: Secondary | ICD-10-CM | POA: Insufficient documentation

## 2023-07-07 DIAGNOSIS — J9811 Atelectasis: Secondary | ICD-10-CM | POA: Diagnosis not present

## 2023-07-07 DIAGNOSIS — D649 Anemia, unspecified: Secondary | ICD-10-CM | POA: Diagnosis not present

## 2023-07-07 DIAGNOSIS — W19XXXA Unspecified fall, initial encounter: Secondary | ICD-10-CM

## 2023-07-07 DIAGNOSIS — J90 Pleural effusion, not elsewhere classified: Secondary | ICD-10-CM | POA: Diagnosis not present

## 2023-07-07 DIAGNOSIS — R918 Other nonspecific abnormal finding of lung field: Secondary | ICD-10-CM | POA: Diagnosis not present

## 2023-07-07 DIAGNOSIS — J449 Chronic obstructive pulmonary disease, unspecified: Secondary | ICD-10-CM | POA: Insufficient documentation

## 2023-07-07 DIAGNOSIS — M19011 Primary osteoarthritis, right shoulder: Secondary | ICD-10-CM | POA: Diagnosis not present

## 2023-07-07 DIAGNOSIS — I959 Hypotension, unspecified: Secondary | ICD-10-CM | POA: Diagnosis not present

## 2023-07-07 DIAGNOSIS — Z7951 Long term (current) use of inhaled steroids: Secondary | ICD-10-CM | POA: Diagnosis not present

## 2023-07-07 DIAGNOSIS — G8929 Other chronic pain: Secondary | ICD-10-CM | POA: Insufficient documentation

## 2023-07-07 DIAGNOSIS — M19072 Primary osteoarthritis, left ankle and foot: Secondary | ICD-10-CM | POA: Diagnosis not present

## 2023-07-07 DIAGNOSIS — I2510 Atherosclerotic heart disease of native coronary artery without angina pectoris: Secondary | ICD-10-CM | POA: Diagnosis not present

## 2023-07-07 DIAGNOSIS — R600 Localized edema: Secondary | ICD-10-CM | POA: Insufficient documentation

## 2023-07-07 DIAGNOSIS — M25519 Pain in unspecified shoulder: Secondary | ICD-10-CM | POA: Diagnosis not present

## 2023-07-07 DIAGNOSIS — M79672 Pain in left foot: Secondary | ICD-10-CM | POA: Insufficient documentation

## 2023-07-07 DIAGNOSIS — R609 Edema, unspecified: Secondary | ICD-10-CM | POA: Diagnosis not present

## 2023-07-07 MED ORDER — SODIUM CHLORIDE 0.9% FLUSH: 10.0000 mL | INTRAVENOUS | Status: DC | PRN

## 2023-07-07 MED ORDER — HYDROCODONE-ACETAMINOPHEN 5-325 MG PO TABS
1.0000 | ORAL_TABLET | Freq: Once | ORAL | Status: AC
  Administered 2023-07-07: 1 via ORAL
  Filled 2023-07-07: qty 1

## 2023-07-07 MED ORDER — CHLORHEXIDINE GLUCONATE CLOTH 2 % EX PADS: 6.0000 | MEDICATED_PAD | Freq: Every day | CUTANEOUS | Status: DC

## 2023-07-07 NOTE — ED Notes (Signed)
IV Team consulted to access port per patient request.

## 2023-07-07 NOTE — ED Provider Notes (Signed)
[name] EMERGENCY DEPARTMENT AT [facility] Provider Note   CSN: [id number] Arrival date & time: 07/07/23  2052     History  Chief Complaint  Patient presents with   Fall    Rt shoulder after fall.    [name] is a 62 y.o. male.  62 year old male with history of stage 4 lung cancer (not currently on any therapy, was told to live life for what is left of it), right BKA. Brought in by EMS after a fall. States he was sitting in his wheelchair and started to feel dizzy described as lightheaded as he was trying to stand.  He then fell landing on his right shoulder.  Denies hitting his head or loss of consciousness.  Is not anticoagulated.  Reports swelling in his left leg thought to be chronic, pain in the left foot of unknown duration.  States that he had a vein harvested from his groin or knee and placed in opposite location (details unclear).        Home Medications Prior to Admission medications   Medication Sig Start Date End Date Taking? Authorizing Provider  albuterol (VENTOLIN HFA) 108 (90 Base) MCG/ACT inhaler Inhale 1-2 puffs into the lungs every 4 (four) hours as needed for wheezing or shortness of breath. 06/30/20   [provider]  alprazolam Prudy Feeler) 2 MG tablet Take 2 mg by mouth 4 (four) times daily as needed for anxiety.    [provider]  amitriptyline (ELAVIL) 100 MG tablet Take 100 mg by mouth at bedtime.     [provider]  aspirin 81 MG EC tablet Take 81 mg by mouth daily.    [provider]  atorvastatin (LIPITOR) 80 MG tablet Take 80 mg by mouth daily. 06/06/21   [provider]  citalopram (CELEXA) 40 MG tablet Take 40 mg by mouth daily. 12/02/14   [provider]  dexamethasone (DECADRON) 4 MG tablet Please take 1 tablet twice a day the day before, the day of, and the day after treatment Patient taking differently: Take 4 mg by mouth See admin instructions. Please take 1 tablet twice a day the  day before, the day of, and the day after treatment 06/16/21   Heilingoetter, Cassandra L, PA-C  folic acid (FOLVITE) 1 MG tablet Take 1 tablet (1 mg total) by mouth daily. 02/24/21   Heilingoetter, Cassandra L, PA-C  HYDROcodone-acetaminophen (NORCO) 10-325 MG tablet Take 1 tablet by mouth every 4 (four) hours as needed for moderate pain. 10/09/21   Rai, Delene Ruffini, MD  levothyroxine (SYNTHROID) 125 MCG tablet Take 125 mcg by mouth daily. 12/04/20   [provider]  lidocaine-prilocaine (EMLA) cream Apply 1 application topically as needed. Patient taking differently: Apply 1 application topically daily as needed (port access). 05/05/21   Si Gaul, MD  midodrine (PROAMATINE) 10 MG tablet Take 1 tablet (10 mg total) by mouth 3 (three) times daily with meals. 03/29/21   Ghimire, Werner Lean, MD  nitroGLYCERIN (NITROSTAT) 0.4 MG SL tablet Place 1 tablet (0.4 mg total) under the tongue every 5 (five) minutes x 3 doses as needed for chest pain. Patient not taking: Reported on 10/14/2021 05/28/16   Little Ishikawa, NP  prochlorperazine (COMPAZINE) 10 MG tablet Take 1 tablet (10 mg total) by mouth every 6 (six) hours as needed for nausea or vomiting. Patient not taking: Reported on 10/14/2021 02/24/21   Heilingoetter, Cassandra L, PA-C  sucralfate (CARAFATE) 1 g tablet Take 1 tablet (  1 g total) by mouth 4 (four) times daily -  with meals and at bedtime. 5 min before meals for radiation induced esophagitis Patient taking differently: Take 1 g by mouth daily. 04/14/18   Margaretmary Dys, MD  tamsulosin (FLOMAX) 0.4 MG CAPS capsule Take 0.4 mg by mouth daily. 01/29/18   [provider]  TRELEGY ELLIPTA 100-62.5-25 MCG/INH AEPB Inhale 1 puff into the lungs daily. 12/13/20   [provider]      Allergies    Lyrica [pregabalin] and Neurontin [gabapentin]    Review of Systems   Review of Systems Negative except as per HPI Physical Exam Updated Vital Signs BP 104/71   Pulse 88   Temp 98.1  F (36.7 C) (Oral)   Resp 19   Ht 6' (1.829 m)   Wt 87.1 kg   SpO2 98%   BMI 26.04 kg/m  Physical Exam Vitals and nursing note reviewed.  Constitutional:      General: He is not in acute distress.    Appearance: He is well-developed. He is not diaphoretic.     Comments: Chronically ill appearing  HENT:     Head: Normocephalic and atraumatic.  Cardiovascular:     Comments: Right chest port, left ICD Pulmonary:     Effort: Pulmonary effort is normal.  Musculoskeletal:        General: Swelling and tenderness present. No deformity or signs of injury.     Left lower leg: Edema present.     Comments: Right BKA Pitting edema left lower leg with foot tenderness. No calf pain  Skin:    General: Skin is warm and dry.  Neurological:     Mental Status: He is alert and oriented to person, place, and time.  Psychiatric:        Behavior: Behavior normal.     ED Results / Procedures / Treatments   Labs (all labs ordered are listed, but only abnormal results are displayed) Labs Reviewed  COMPREHENSIVE METABOLIC PANEL - Abnormal; Notable for the following components:      Result Value   Sodium 131 (*)    Potassium 3.2 (*)    Chloride 91 (*)    Glucose, Bld 104 (*)    Calcium 8.3 (*)    Total Protein 5.7 (*)    Albumin 3.1 (*)    All other components within normal limits  CBC - Abnormal; Notable for the following components:   RBC 3.93 (*)    Hemoglobin 11.8 (*)    HCT 35.5 (*)    All other components within normal limits  URINALYSIS, ROUTINE W REFLEX MICROSCOPIC - Abnormal; Notable for the following components:   Color, Urine STRAW (*)    Specific Gravity, Urine 1.003 (*)    All other components within normal limits    EKG EKG Interpretation Date/Time:  Thursday July 07 2023 22:49:28 EDT Ventricular Rate:  80 PR Interval:  180 QRS Duration:  83 QT Interval:  440 QTC Calculation: 508 R Axis:   13  Text Interpretation: Sinus rhythm Consider right atrial enlargement  Anterior infarct, old Prolonged QT interval Confirmed by Ross Marcus ([id number]) on 07/08/2023 12:18:36 AM  Radiology CT Angio Chest PE W/Cm &/Or Wo Cm  Result Date: 07/08/2023 CLINICAL DATA:  Pulmonary embolism suspected, high probability. Fall. EXAM: CT ANGIOGRAPHY CHEST WITH CONTRAST TECHNIQUE: Multidetector CT imaging of the chest was performed using the standard protocol during bolus administration of intravenous contrast. Multiplanar CT image reconstructions and MIPs were obtained to evaluate  the vascular anatomy. RADIATION DOSE REDUCTION: This exam was performed according to the departmental dose-optimization program which includes automated exposure control, adjustment of the mA and/or kV according to patient size and/or use of iterative reconstruction technique. CONTRAST:  75mL OMNIPAQUE IOHEXOL 350 MG/ML SOLN COMPARISON:  11/05/2022. FINDINGS: Cardiovascular: The heart is normal in size and there is a trace pericardial effusion. Pacemaker leads are noted in the heart. Coronary artery calcifications are noted. There is mild atherosclerotic calcification of the aorta without evidence of aneurysm. The pulmonary trunk is normal in caliber. A right chest port terminates in the right atrium. No evidence of pulmonary embolism. Mediastinum/Nodes: No mediastinal, hilar, or axillary lymphadenopathy is seen. Small amount of frothy debris is present in the trachea. The esophagus is within normal limits. Lungs/Pleura: Centrilobular emphysematous changes are present in the lungs. There is chronic right upper lobe collapse and consolidation with associated chronic pneumothorax with pleural thickening and reduced lung volume. There is bronchial wall thickening with increased airspace disease and consolidation in the right upper and lower lobes. A new 4 mm nodule is noted in the right upper lobe, axial image 51. There is a new subpleural nodule in the right lower lobe measuring 9 mm, axial image 80. Atelectasis is  noted in the left lower lobe. A stable nodule is noted in the left upper lobe measuring 5 mm, axial image 48. There are new nodules in the left upper lobe measuring up to 9 mm, axial image 54. There is a stable 3 mm nodule in the left upper lobe, axial image 82. There is a trace left pleural effusion. Upper Abdomen: The gallbladder is surgically absent. No acute abnormality. Musculoskeletal: A pacemaker device is noted in the anterior chest wall on the left. Degenerative changes are present in the thoracic spine. No acute osseous abnormality. Review of the MIP images confirms the above findings. IMPRESSION: 1. No evidence of pulmonary embolism. 2. Bronchial wall thickening on the right with increased consolidation in the right upper and lower lobes, which may be infectious or inflammatory. 3. Stable chronic right upper lobe collapse and associated chronic right pneumothorax with pleural thickening. 4. New pulmonary nodules bilaterally measuring up to 9 mm, possible metastatic disease. Attention on follow-up is recommended. 5. Stable left lower lobe atelectasis with small chronic left pleural effusion. 6. Aortic atherosclerosis and coronary artery calcifications. Electronically Signed   By: Thornell Sartorius M.D.   On: 07/08/2023 02:34   DG Foot Complete Left  Result Date: 07/08/2023 CLINICAL DATA:  Swelling and pain. EXAM: LEFT FOOT - COMPLETE 3+ VIEW COMPARISON:  None Available. FINDINGS: There is no evidence of fracture or dislocation. Degenerative changes are noted in the midfoot. Soft tissues are unremarkable. IMPRESSION: No acute osseous abnormality. Electronically Signed   By: Thornell Sartorius M.D.   On: 07/08/2023 00:39   DG Shoulder Right  Result Date: 07/07/2023 CLINICAL DATA:  Right shoulder pain, fall. EXAM: RIGHT SHOULDER - 2+ VIEW COMPARISON:  Chest CT 11/05/2022 FINDINGS: There is no evidence of fracture or dislocation. Glenohumeral and acromioclavicular degenerative change. Right apical opacity is  chronic when compared with prior chest CT. Right chest port in place. No fracture of included right ribs. IMPRESSION: No fracture or dislocation of the right shoulder. Glenohumeral and acromioclavicular degenerative change. Electronically Signed   By: Narda Rutherford M.D.   On: 07/07/2023 22:14    Procedures Procedures    Medications Ordered in ED Medications  sodium chloride flush (NS) 0.9 % injection 10-40 mL (has no  administration in time range)  Chlorhexidine Gluconate Cloth 2 % PADS 6 each (has no administration in time range)  HYDROcodone-acetaminophen (NORCO/VICODIN) 5-325 MG per tablet 1 tablet (1 tablet Oral Given 07/07/23 2328)  lactated ringers bolus 500 mL (0 mLs Intravenous Stopped 07/08/23 0155)  potassium chloride SA (KLOR-CON M) CR tablet 40 mEq (40 mEq Oral Given 07/08/23 0117)  iohexol (OMNIPAQUE) 350 MG/ML injection 75 mL (75 mLs Intravenous Contrast Given 07/08/23 0119)  lactated ringers bolus 500 mL (0 mLs Intravenous Stopped 07/08/23 0334)  midodrine (PROAMATINE) tablet 10 mg (10 mg Oral Given 07/08/23 0305)    ED Course/ Medical Decision Making/ A&P                                 Medical Decision Making Amount and/or Complexity of Data Reviewed Labs: ordered. Radiology: ordered.  Risk OTC drugs. Prescription drug management.   This patient presents to the ED for concern of right shoulder pain after fall/feeling light headed, this involves an extensive number of treatment options, and is a complaint that carries with it a high risk of complications and morbidity.  The differential diagnosis includes DVT, PE, metabolic or electrolyte disturbance, orthostatic hypotension, medication noncompliance   Co morbidities that complicate the patient evaluation  AICD, STEMI, asthma, chronic hep c, COPD, PAD, DVT   Additional history obtained:  External records from outside source obtained and reviewed including prior admission dated 06/10/2023 and again on  06/16/2023 with concern for sepsis due to hospital-acquired pneumonia.  Patient was hypotensive, started on pressors and antibiotics and admitted, ultimately left AMA.  On further chart review, patient is normally hypotensive with pressures in the 80s to 90s, is prescribed midodrine but is not currently taking his midodrine or any other medications. Prior EF on file of 30 to 35%.   Lab Tests:  I Ordered, and personally interpreted labs.  The pertinent results include: CBC with mild anemia with hemoglobin of 11.8.  Urinalysis is unremarkable.  CMP with mild hypokalemia with potassium of 3.2.   Imaging Studies ordered:  I ordered imaging studies including CT angio chest, x-ray of the left foot, x-ray right shoulder, venous Doppler left lower extremity for DVT I independently visualized and interpreted imaging which showed negative for PE, does have chronic changes with concern for metastatic process in the lungs.  X-ray of the left foot is unremarkable. I agree with the radiologist interpretation   Cardiac Monitoring: / EKG:  The patient was maintained on a cardiac monitor.  I personally viewed and interpreted the cardiac monitored which showed an underlying rhythm of: Sinus rhythm, rate 80   Consultations Obtained:  I requested consultation with the ER attending, Dr. Wilkie Aye,  and discussed lab and imaging findings as well as pertinent plan - they recommend: Chart review, agrees with plan of care, patient is mentating normally, blood pressures improved/at baseline.   Problem List / ED Course / Critical interventions / Medication management  62 year old male with history of stage 4 lung cancer, not currently on any treatment, PVD with recent stent left leg, right BKA. Here tonight due to feeling light headed with standing to transfer and fall onto right shoulder. Found to have pitting edema to the left lower leg with pain in the left foot. DP pulse present, sensation intact. Provided with  fluid bolus (500cc and 500cc) as well as dose of midodrine (hx hypotension, prescribed midodrine, states he was given a  bag of meds when he left the hospital after his surgery and has finished the meds, has not refilled his daily meds, not currently on thinners). On recheck, patient mentating normally, feeling improved, blood pressure currently 83/61.  Plan is to obtain venous Doppler of the left lower extremity due to edema with likely discharge to follow-up with PCP, consider refill of his midodrine. Hypotensive in the ER with BP 70s-90s. Chart review, BP normally 80s-90s systolic.  I ordered medication including IVF, midodrine  for hypotension   Reevaluation of the patient after these medicines showed that the patient stayed the same- on chart review, noted to be at baseline  I have reviewed the patients home medicines and have made adjustments as needed   Social Determinants of Health:  Recently moved to [facility], lives with family, sees oncology    Test / Admission - Considered:  Disposition at time of signout to oncoming provider.         Final Clinical Impression(s) / ED Diagnoses Final diagnoses:  Fall, initial encounter  Hypokalemia  Hypotension, unspecified hypotension type    Rx / DC Orders ED Discharge Orders     None         Jeannie Fend, PA-C 07/08/23 1610    Shon Baton, MD 07/08/23 [phone]

## 2023-07-07 NOTE — ED Triage Notes (Signed)
Pt BIB EMS from home for fall out of a wheel chair, co pain in the right shoulder, pt states that he has chronic shoulder pain, pt denies any LOC, denies hitting head, pt is on blood thinners per EMS report. No deformities noted. Pt has RBKA,

## 2023-07-07 NOTE — ED Provider Triage Note (Signed)
Emergency Medicine Provider Triage Evaluation Note  [name] , a 62 y.o. male  was evaluated in triage.  Pt complains of fall..  Review of Systems  Positive: Shoulder pain Negative: Lost consciousness.  Physical Exam  BP 91/63 (BP Location: Right Arm)   Pulse 90   Temp 97.7 F (36.5 C)   Resp 18   SpO2 98%  Awake and appropriate.  Right shoulder pain.  States it is somewhat chronic.  Some edema left lower leg.  No chest or abdominal tenderness.  Medical Decision Making  Medically screening exam initiated at 9:22 PM.  Appropriate orders placed.  [name] was informed that the remainder of the evaluation will be completed by another provider, this initial triage assessment does not replace that evaluation, and the importance of remaining in the ED until their evaluation is complete.  Patient with fall.  Hypotensive.  Unsure if he is on blood thinners.  Recent documentation is not sure what but states he was in [place] and he may have started something there.  Blood pressure of 90.  Benign chest abdominal exam but with hypotension I think the basic blood work would be in order.  Denies hitting head.   Benjiman Core, MD 07/07/23 2123

## 2023-07-08 ENCOUNTER — Emergency Department (HOSPITAL_COMMUNITY): Payer: Medicare Other

## 2023-07-08 DIAGNOSIS — I2510 Atherosclerotic heart disease of native coronary artery without angina pectoris: Secondary | ICD-10-CM | POA: Diagnosis not present

## 2023-07-08 DIAGNOSIS — R918 Other nonspecific abnormal finding of lung field: Secondary | ICD-10-CM | POA: Diagnosis not present

## 2023-07-08 DIAGNOSIS — J90 Pleural effusion, not elsewhere classified: Secondary | ICD-10-CM | POA: Diagnosis not present

## 2023-07-08 DIAGNOSIS — J9811 Atelectasis: Secondary | ICD-10-CM | POA: Diagnosis not present

## 2023-07-08 LAB — COMPREHENSIVE METABOLIC PANEL
ALT: 16 U/L (ref 0–44)
AST: 18 U/L (ref 15–41)
Albumin: 3.1 g/dL — ABNORMAL LOW (ref 3.5–5.0)
Alkaline Phosphatase: 70 U/L (ref 38–126)
Anion gap: 13 (ref 5–15)
BUN: 8 mg/dL (ref 6–20)
CO2: 27 mmol/L (ref 22–32)
Calcium: 8.3 mg/dL — ABNORMAL LOW (ref 8.9–10.3)
Chloride: 91 mmol/L — ABNORMAL LOW (ref 98–111)
Creatinine, Ser: 0.9 mg/dL (ref 0.61–1.24)
GFR, Estimated: >60 mL/min (ref >60)
Glucose, Bld: 104 mg/dL — ABNORMAL HIGH (ref 70–99)
Potassium: 3.2 mmol/L — ABNORMAL LOW (ref 3.5–5.1)
Sodium: 131 mmol/L — ABNORMAL LOW (ref 135–145)
Total Bilirubin: 0.5 mg/dL (ref 0.3–1.2)
Total Protein: 5.7 g/dL — ABNORMAL LOW (ref 6.5–8.1)

## 2023-07-08 LAB — URINALYSIS, ROUTINE W REFLEX MICROSCOPIC
Bilirubin Urine: NEGATIVE
Glucose, UA: NEGATIVE mg/dL
Hgb urine dipstick: NEGATIVE
Ketones, ur: NEGATIVE mg/dL
Leukocytes,Ua: NEGATIVE
Nitrite: NEGATIVE
Protein, ur: NEGATIVE mg/dL
Specific Gravity, Urine: 1.003 — ABNORMAL LOW (ref 1.005–1.030)
pH: 6 (ref 5.0–8.0)

## 2023-07-08 LAB — CBC
HCT: 35.5 % — ABNORMAL LOW (ref 39.0–52.0)
Hemoglobin: 11.8 g/dL — ABNORMAL LOW (ref 13.0–17.0)
MCH: 30 pg (ref 26.0–34.0)
MCHC: 33.2 g/dL (ref 30.0–36.0)
MCV: 90.3 fL (ref 80.0–100.0)
Platelets: 185 10*3/uL (ref 150–400)
RBC: 3.93 MIL/uL — ABNORMAL LOW (ref 4.22–5.81)
RDW: 14.6 % (ref 11.5–15.5)
WBC: 7.4 10*3/uL (ref 4.0–10.5)
nRBC: 0 % (ref 0.0–0.2)

## 2023-07-08 MED ORDER — MIDODRINE HCL 5 MG PO TABS
10.0000 mg | ORAL_TABLET | Freq: Once | ORAL | Status: AC
  Administered 2023-07-08: 10 mg via ORAL
  Filled 2023-07-08: qty 2

## 2023-07-08 MED ORDER — HYDROCODONE-ACETAMINOPHEN 5-325 MG PO TABS
1.0000 | ORAL_TABLET | Freq: Once | ORAL | Status: AC
  Administered 2023-07-08: 1 via ORAL
  Filled 2023-07-08: qty 1

## 2023-07-08 MED ORDER — POTASSIUM CHLORIDE CRYS ER 20 MEQ PO TBCR
40.0000 meq | EXTENDED_RELEASE_TABLET | Freq: Once | ORAL | Status: AC
  Administered 2023-07-08: 40 meq via ORAL
  Filled 2023-07-08: qty 2

## 2023-07-08 MED ORDER — LACTATED RINGERS IV BOLUS
500.0000 mL | Freq: Once | INTRAVENOUS | Status: AC
  Administered 2023-07-08: 500 mL via INTRAVENOUS

## 2023-07-08 MED ORDER — HEPARIN SOD (PORK) LOCK FLUSH 100 UNIT/ML IV SOLN
500.0000 [IU] | Freq: Once | INTRAVENOUS | Status: DC
  Filled 2023-07-08: qty 5

## 2023-07-08 MED ORDER — MIDODRINE HCL 10 MG PO TABS: 10.0000 mg | ORAL_TABLET | Freq: Three times a day (TID) | ORAL | 0 refills | Status: AC

## 2023-07-08 MED ORDER — IOHEXOL 350 MG/ML SOLN
75.0000 mL | Freq: Once | INTRAVENOUS | Status: AC | PRN
  Administered 2023-07-08: 75 mL via INTRAVENOUS

## 2023-07-08 NOTE — ED Notes (Signed)
Port line heparin locked per policy and needle removed.

## 2023-07-08 NOTE — ED Provider Notes (Signed)
  Physical Exam  BP 90/63   Pulse 85   Temp 98.2 F (36.8 C) (Oral)   Resp (!) 21   Ht 6' (1.829 m)   Wt 87.1 kg   SpO2 100%   BMI 26.04 kg/m   Physical Exam  Procedures  Procedures  ED Course / MDM    Medical Decision Making Amount and/or Complexity of Data Reviewed Labs: ordered. Radiology: ordered.  Risk OTC drugs. Prescription drug management.   Patient care taken over from previous provider at shift change. Patient had a fall while trying to get out of his wheelchair last night and complained of right shoulder pain. X-ray showed no acute changes.  DVT study is negative. I discussed findings with patient. LR given prior to DC for hypotension.  Patient states that he is concerned for developing skin tears/bed sores from being sedentary. None are present currently. Advised to try OTC ointment.  Patient is hemodynamically stable and safe for discharge home. Patient's BP is normally in the 80s systolically. He states that he ran out of his Mid[place]ine prescription - new prescription sent to the pharmacy. Return precautions given.   Maxwell Marion, PA-C 07/08/23 1116    Linwood Dibbles, MD 07/08/23 1745

## 2023-07-08 NOTE — ED Notes (Signed)
Patient's BP running low. Woke patient up and switched arms. Still low, but improved. Provider made aware.

## 2023-07-08 NOTE — ED Notes (Signed)
BP low. Provider notified. Orders for LR bolus received.

## 2023-07-08 NOTE — ED Notes (Signed)
Heparin lock ordered for possible DC per protocol for port-a-cath

## 2023-07-08 NOTE — ED Notes (Signed)
Spoke to [employer] with [employer], no arrythmias noted, functioning as programmed.

## 2023-07-08 NOTE — ED Notes (Signed)
Blood pressures still low. Provider made aware. New orders obtained.

## 2023-07-08 NOTE — Discharge Instructions (Addendum)
As discussed, your labs and imaging are reassuring. I have sent a prescription of Midodrine to your pharmacy, take this 3x a day for your low blood pressure.  Elevate your leg above heart level to help reduce swelling. For your concern of skin tears, you can pick up OTC Bacitracin cream, as it is not covered by your insurance.  Follow up with your PCP in 3-5 days for reevaluation of your symptoms.  Return to ED if your symptoms worsen in the interim.

## 2023-07-08 NOTE — Progress Notes (Signed)
Left lower ext venous  has been completed. Refer to [facility] under chart review to view preliminary results.   07/08/2023  9:46 AM Ryleigh Buenger, Gerarda Gunther

## 2023-07-08 NOTE — ED Notes (Signed)
Spoke with patient regarding transport home due to pending DC. He advised his wife or his sister in law would come get him.

## 2023-07-11 ENCOUNTER — Encounter: Payer: Self-pay | Admitting: Internal Medicine

## 2023-07-11 NOTE — Telephone Encounter (Signed)
error 

## 2023-07-15 ENCOUNTER — Ambulatory Visit (HOSPITAL_COMMUNITY)
Admission: RE | Admit: 2023-07-15 | Discharge: 2023-07-15 | Disposition: A | Discharge status: Home / Self Care | Payer: Medicare Other | Source: Ambulatory Visit | Attending: Internal Medicine | Admitting: Internal Medicine

## 2023-07-15 ENCOUNTER — Other Ambulatory Visit (HOSPITAL_COMMUNITY): Payer: Self-pay | Admitting: Internal Medicine

## 2023-07-15 DIAGNOSIS — I255 Ischemic cardiomyopathy: Secondary | ICD-10-CM | POA: Diagnosis not present

## 2023-07-15 DIAGNOSIS — L03116 Cellulitis of left lower limb: Secondary | ICD-10-CM | POA: Diagnosis not present

## 2023-07-15 DIAGNOSIS — I739 Peripheral vascular disease, unspecified: Secondary | ICD-10-CM | POA: Diagnosis not present

## 2023-07-15 DIAGNOSIS — M79605 Pain in left leg: Secondary | ICD-10-CM | POA: Insufficient documentation

## 2023-07-15 DIAGNOSIS — Z89511 Acquired absence of right leg below knee: Secondary | ICD-10-CM | POA: Diagnosis not present

## 2023-07-15 DIAGNOSIS — R600 Localized edema: Secondary | ICD-10-CM | POA: Diagnosis not present

## 2023-07-15 DIAGNOSIS — I10 Essential (primary) hypertension: Secondary | ICD-10-CM | POA: Diagnosis not present

## 2023-07-15 DIAGNOSIS — G546 Phantom limb syndrome with pain: Secondary | ICD-10-CM | POA: Diagnosis not present

## 2023-07-15 DIAGNOSIS — M7989 Other specified soft tissue disorders: Secondary | ICD-10-CM | POA: Diagnosis not present

## 2023-07-15 DIAGNOSIS — I5020 Unspecified systolic (congestive) heart failure: Secondary | ICD-10-CM | POA: Diagnosis not present

## 2023-07-15 DIAGNOSIS — C3490 Malignant neoplasm of unspecified part of unspecified bronchus or lung: Secondary | ICD-10-CM | POA: Diagnosis not present

## 2023-07-15 DIAGNOSIS — J449 Chronic obstructive pulmonary disease, unspecified: Secondary | ICD-10-CM | POA: Diagnosis not present

## 2023-07-28 ENCOUNTER — Encounter: Payer: Self-pay | Admitting: General Practice

## 2023-08-17 ENCOUNTER — Encounter: Payer: Self-pay | Admitting: Internal Medicine

## 2023-08-17 NOTE — Telephone Encounter (Signed)
Telephone call  

## 2023-08-31 ENCOUNTER — Ambulatory Visit: Payer: Medicare Other

## 2023-09-12 DIAGNOSIS — Z91199 Patient's noncompliance with other medical treatment and regimen due to unspecified reason: Secondary | ICD-10-CM | POA: Diagnosis not present

## 2023-09-12 DIAGNOSIS — I5023 Acute on chronic systolic (congestive) heart failure: Secondary | ICD-10-CM | POA: Diagnosis not present

## 2023-09-12 DIAGNOSIS — I110 Hypertensive heart disease with heart failure: Secondary | ICD-10-CM | POA: Diagnosis not present

## 2023-09-12 DIAGNOSIS — R079 Chest pain, unspecified: Secondary | ICD-10-CM | POA: Diagnosis not present

## 2023-09-12 DIAGNOSIS — Z95810 Presence of automatic (implantable) cardiac defibrillator: Secondary | ICD-10-CM | POA: Diagnosis not present

## 2023-09-12 DIAGNOSIS — I255 Ischemic cardiomyopathy: Secondary | ICD-10-CM | POA: Diagnosis not present

## 2023-09-12 DIAGNOSIS — E876 Hypokalemia: Secondary | ICD-10-CM | POA: Diagnosis not present

## 2023-09-12 DIAGNOSIS — Z86718 Personal history of other venous thrombosis and embolism: Secondary | ICD-10-CM | POA: Diagnosis not present

## 2023-09-12 DIAGNOSIS — S0990XA Unspecified injury of head, initial encounter: Secondary | ICD-10-CM | POA: Diagnosis not present

## 2023-09-12 DIAGNOSIS — R627 Adult failure to thrive: Secondary | ICD-10-CM | POA: Diagnosis not present

## 2023-09-12 DIAGNOSIS — Z89511 Acquired absence of right leg below knee: Secondary | ICD-10-CM | POA: Diagnosis not present

## 2023-09-12 DIAGNOSIS — Z7901 Long term (current) use of anticoagulants: Secondary | ICD-10-CM | POA: Diagnosis not present

## 2023-09-12 DIAGNOSIS — I739 Peripheral vascular disease, unspecified: Secondary | ICD-10-CM | POA: Diagnosis not present

## 2023-09-12 DIAGNOSIS — R0602 Shortness of breath: Secondary | ICD-10-CM | POA: Diagnosis not present

## 2023-09-12 DIAGNOSIS — F17210 Nicotine dependence, cigarettes, uncomplicated: Secondary | ICD-10-CM | POA: Diagnosis not present

## 2023-09-12 DIAGNOSIS — E871 Hypo-osmolality and hyponatremia: Secondary | ICD-10-CM | POA: Diagnosis not present

## 2023-09-12 DIAGNOSIS — Z7982 Long term (current) use of aspirin: Secondary | ICD-10-CM | POA: Diagnosis not present

## 2023-09-12 DIAGNOSIS — L03116 Cellulitis of left lower limb: Secondary | ICD-10-CM | POA: Diagnosis not present

## 2023-09-12 DIAGNOSIS — E039 Hypothyroidism, unspecified: Secondary | ICD-10-CM | POA: Diagnosis not present

## 2023-09-12 DIAGNOSIS — Z79899 Other long term (current) drug therapy: Secondary | ICD-10-CM | POA: Diagnosis not present

## 2023-09-12 DIAGNOSIS — E785 Hyperlipidemia, unspecified: Secondary | ICD-10-CM | POA: Diagnosis not present

## 2023-09-12 DIAGNOSIS — Z85118 Personal history of other malignant neoplasm of bronchus and lung: Secondary | ICD-10-CM | POA: Diagnosis not present

## 2023-09-12 DIAGNOSIS — I959 Hypotension, unspecified: Secondary | ICD-10-CM | POA: Diagnosis not present

## 2023-09-13 DIAGNOSIS — Z7189 Other specified counseling: Secondary | ICD-10-CM | POA: Diagnosis not present

## 2023-09-13 DIAGNOSIS — E785 Hyperlipidemia, unspecified: Secondary | ICD-10-CM | POA: Diagnosis not present

## 2023-09-13 DIAGNOSIS — F17210 Nicotine dependence, cigarettes, uncomplicated: Secondary | ICD-10-CM | POA: Diagnosis not present

## 2023-09-13 DIAGNOSIS — Z7901 Long term (current) use of anticoagulants: Secondary | ICD-10-CM | POA: Diagnosis not present

## 2023-09-13 DIAGNOSIS — M7989 Other specified soft tissue disorders: Secondary | ICD-10-CM | POA: Diagnosis not present

## 2023-09-13 DIAGNOSIS — I2510 Atherosclerotic heart disease of native coronary artery without angina pectoris: Secondary | ICD-10-CM | POA: Diagnosis not present

## 2023-09-13 DIAGNOSIS — Z515 Encounter for palliative care: Secondary | ICD-10-CM | POA: Diagnosis not present

## 2023-09-13 DIAGNOSIS — C3490 Malignant neoplasm of unspecified part of unspecified bronchus or lung: Secondary | ICD-10-CM | POA: Diagnosis not present

## 2023-09-13 DIAGNOSIS — R0789 Other chest pain: Secondary | ICD-10-CM | POA: Diagnosis not present

## 2023-09-13 DIAGNOSIS — I739 Peripheral vascular disease, unspecified: Secondary | ICD-10-CM | POA: Diagnosis not present

## 2023-09-13 DIAGNOSIS — M79662 Pain in left lower leg: Secondary | ICD-10-CM | POA: Diagnosis not present

## 2023-09-13 DIAGNOSIS — Z9861 Coronary angioplasty status: Secondary | ICD-10-CM | POA: Diagnosis not present

## 2023-09-13 DIAGNOSIS — R627 Adult failure to thrive: Secondary | ICD-10-CM | POA: Diagnosis not present

## 2023-09-13 DIAGNOSIS — I255 Ischemic cardiomyopathy: Secondary | ICD-10-CM | POA: Diagnosis not present

## 2023-09-13 DIAGNOSIS — Z7982 Long term (current) use of aspirin: Secondary | ICD-10-CM | POA: Diagnosis not present

## 2023-09-13 DIAGNOSIS — I5020 Unspecified systolic (congestive) heart failure: Secondary | ICD-10-CM | POA: Diagnosis not present

## 2023-09-13 DIAGNOSIS — I110 Hypertensive heart disease with heart failure: Secondary | ICD-10-CM | POA: Diagnosis not present

## 2023-09-15 ENCOUNTER — Ambulatory Visit: Payer: Medicare Other

## 2023-09-19 DIAGNOSIS — E876 Hypokalemia: Secondary | ICD-10-CM | POA: Diagnosis not present

## 2023-10-04 ENCOUNTER — Telehealth: Payer: Self-pay

## 2023-10-04 NOTE — Telephone Encounter (Signed)
 Patients wife called in and stated that [name] missed a court date due to being stuck in [place]  because of the snow.  She states that the lawyer is asking for a letter stating his diagnosis. Letter wrote, signed and sent to address given by Romero at [address] [place], [address].   Romero stated that [name] was tired of getting chemo and he has been hospitalized in [place]  twice.  Romero stated that he is doing good otherwise. Informed janice to call us  if Colyn needs anything.

## 2023-10-21 NOTE — Telephone Encounter (Signed)
Attempted to call pts wife x2 and answer questions. No answer. Unable to leave VM.

## 2023-10-24 ENCOUNTER — Telehealth: Payer: Self-pay

## 2023-10-24 NOTE — Telephone Encounter (Signed)
[phone]  Spoke to [name] (pts wife) who states patient left in August to go to [place]. Spoke to patient who states he is unsure when he is coming back to [place]. Patient does not have remote monitor with him in [place]. Inquired with patient to see if he was planning on getting someone to follow his device in [place], states the closet place someone could see him is an hour away which is he not able to do. Offered patient an apt if he is coming back to [place]. Pt states he does not know when he is. Pt is not planning on moving back to [place] but coming to get personal items.   Patient was last seen in office 06/2020. Patient would need to reestablish care.   Dr. Elberta Fortis, what do you recommend about remote monitoring since patient has not been seen since 06/2020 and unsure when he is coming back to [facility]. Do I need to order a new remote monitor to continue monitoring or does patient need to be seen in office first?

## 2023-10-24 NOTE — Telephone Encounter (Signed)
-----   Message from Naschitti H sent at 10/21/2023  4:44 PM EST ----- Regarding: RE: Recall I have finally been able to reach patients wife [name].   She states the patient is in [place] taking care his parents who are in their 50's. She is unsure of when patient will return, considering why he's up there and with the weather. She also stated that the transmitter for his device is not in [place] with the patient. She wants to make sure that it will not still work with the distance between the patient and the transmitter. She wanted to make office aware patient has stage 4 lung cancer and has stopped chemo.  Cassie ----- Message ----- From: Lenor Coffin, RN Sent: 09/13/2023   8:54 AM EST To: Cvd-Ep Scheduling Subject: Recall                                         Pt. has recall and needs apt.   Thanks, Marliss Czar

## 2023-10-25 NOTE — Telephone Encounter (Signed)
Patient is not planning on moving back to [place] but to come back to get his things. I encouraged him to obtain an EP there but he advised the commute was an issue for him, >1 hour away.

## 2023-11-30 ENCOUNTER — Ambulatory Visit: Payer: Medicare Other

## 2023-12-01 ENCOUNTER — Encounter: Payer: Self-pay | Admitting: Emergency Medicine

## 2023-12-07 DIAGNOSIS — Z6822 Body mass index (BMI) 22.0-22.9, adult: Secondary | ICD-10-CM | POA: Diagnosis not present

## 2023-12-07 DIAGNOSIS — C3490 Malignant neoplasm of unspecified part of unspecified bronchus or lung: Secondary | ICD-10-CM | POA: Diagnosis not present

## 2023-12-07 DIAGNOSIS — E039 Hypothyroidism, unspecified: Secondary | ICD-10-CM | POA: Diagnosis not present

## 2023-12-07 DIAGNOSIS — D518 Other vitamin B12 deficiency anemias: Secondary | ICD-10-CM | POA: Diagnosis not present

## 2023-12-07 DIAGNOSIS — Z0001 Encounter for general adult medical examination with abnormal findings: Secondary | ICD-10-CM | POA: Diagnosis not present

## 2023-12-07 DIAGNOSIS — I739 Peripheral vascular disease, unspecified: Secondary | ICD-10-CM | POA: Diagnosis not present

## 2023-12-07 DIAGNOSIS — I10 Essential (primary) hypertension: Secondary | ICD-10-CM | POA: Diagnosis not present

## 2023-12-07 DIAGNOSIS — I700 Atherosclerosis of aorta: Secondary | ICD-10-CM | POA: Diagnosis not present

## 2023-12-07 DIAGNOSIS — Z89511 Acquired absence of right leg below knee: Secondary | ICD-10-CM | POA: Diagnosis not present

## 2023-12-07 DIAGNOSIS — G894 Chronic pain syndrome: Secondary | ICD-10-CM | POA: Diagnosis not present

## 2023-12-07 DIAGNOSIS — J449 Chronic obstructive pulmonary disease, unspecified: Secondary | ICD-10-CM | POA: Diagnosis not present

## 2023-12-07 DIAGNOSIS — E559 Vitamin D deficiency, unspecified: Secondary | ICD-10-CM | POA: Diagnosis not present

## 2023-12-07 DIAGNOSIS — G546 Phantom limb syndrome with pain: Secondary | ICD-10-CM | POA: Diagnosis not present

## 2023-12-08 NOTE — Progress Notes (Signed)
 I reached out to the pt to introduce myself and discuss making an appt with Dr. Arbutus Ped. Only number in pts record is his wife's. I tried calling twice. No answer. The recording "call cannot be completed at this time" received both times. No VM box set up to leave a message.

## 2023-12-09 NOTE — Progress Notes (Signed)
 I reached out to the pt again at his home/mobile number. Same recording, unable to complete call.  Pts son, Nicola Girt, was on pt's ROI. I was able to reach him and let him know the cancer center was trying to get a hold of him to schedule him for a consult with Dr Arbutus Ped. Nicola Girt states that he will likely be his main POC. Nicola Girt states he will get in touch with the pt and give him my number to call me back. Number given to Johnathan. Awaitin a call from the pt at this time.

## 2023-12-13 ENCOUNTER — Telehealth: Payer: Self-pay

## 2023-12-13 NOTE — Telephone Encounter (Signed)
 Entered in error

## 2023-12-15 ENCOUNTER — Ambulatory Visit: Payer: Medicare Other

## 2023-12-20 ENCOUNTER — Telehealth: Payer: Self-pay

## 2023-12-20 NOTE — Telephone Encounter (Signed)
error 

## 2024-01-05 DIAGNOSIS — G894 Chronic pain syndrome: Secondary | ICD-10-CM | POA: Diagnosis not present

## 2024-01-05 DIAGNOSIS — G546 Phantom limb syndrome with pain: Secondary | ICD-10-CM | POA: Diagnosis not present

## 2024-01-05 DIAGNOSIS — J449 Chronic obstructive pulmonary disease, unspecified: Secondary | ICD-10-CM | POA: Diagnosis not present

## 2024-01-05 DIAGNOSIS — Z89511 Acquired absence of right leg below knee: Secondary | ICD-10-CM | POA: Diagnosis not present

## 2024-01-05 DIAGNOSIS — C3490 Malignant neoplasm of unspecified part of unspecified bronchus or lung: Secondary | ICD-10-CM | POA: Diagnosis not present

## 2024-01-11 ENCOUNTER — Telehealth: Payer: Self-pay | Admitting: Internal Medicine

## 2024-01-11 NOTE — Telephone Encounter (Signed)
 Attempted to contact the patient and his wife to schedule a follow up to continue care.

## 2024-02-02 DIAGNOSIS — C3490 Malignant neoplasm of unspecified part of unspecified bronchus or lung: Secondary | ICD-10-CM | POA: Diagnosis not present

## 2024-02-02 DIAGNOSIS — I739 Peripheral vascular disease, unspecified: Secondary | ICD-10-CM | POA: Diagnosis not present

## 2024-02-02 DIAGNOSIS — G894 Chronic pain syndrome: Secondary | ICD-10-CM | POA: Diagnosis not present

## 2024-02-17 ENCOUNTER — Emergency Department (HOSPITAL_BASED_OUTPATIENT_CLINIC_OR_DEPARTMENT_OTHER)
Admit: 2024-02-17 | Discharge: 2024-02-17 | Disposition: A | Discharge status: Home / Self Care | Attending: Emergency Medicine | Admitting: Emergency Medicine

## 2024-02-17 ENCOUNTER — Other Ambulatory Visit: Payer: Self-pay

## 2024-02-17 ENCOUNTER — Encounter (HOSPITAL_COMMUNITY): Payer: Self-pay

## 2024-02-17 ENCOUNTER — Observation Stay (HOSPITAL_COMMUNITY)

## 2024-02-17 ENCOUNTER — Inpatient Hospital Stay (HOSPITAL_COMMUNITY)
Admission: EM | Admit: 2024-02-17 | Discharge: 2024-02-27 | DRG: 054 | Disposition: A | Discharge status: Home Health | Attending: Internal Medicine | Admitting: Internal Medicine

## 2024-02-17 ENCOUNTER — Emergency Department (HOSPITAL_COMMUNITY)

## 2024-02-17 DIAGNOSIS — F17210 Nicotine dependence, cigarettes, uncomplicated: Secondary | ICD-10-CM | POA: Diagnosis present

## 2024-02-17 DIAGNOSIS — C3411 Malignant neoplasm of upper lobe, right bronchus or lung: Secondary | ICD-10-CM | POA: Diagnosis not present

## 2024-02-17 DIAGNOSIS — S199XXA Unspecified injury of neck, initial encounter: Secondary | ICD-10-CM | POA: Diagnosis not present

## 2024-02-17 DIAGNOSIS — I501 Left ventricular failure: Secondary | ICD-10-CM | POA: Diagnosis not present

## 2024-02-17 DIAGNOSIS — B888 Other specified infestations: Secondary | ICD-10-CM | POA: Diagnosis not present

## 2024-02-17 DIAGNOSIS — Z95810 Presence of automatic (implantable) cardiac defibrillator: Secondary | ICD-10-CM | POA: Diagnosis present

## 2024-02-17 DIAGNOSIS — M79672 Pain in left foot: Secondary | ICD-10-CM | POA: Diagnosis not present

## 2024-02-17 DIAGNOSIS — W19XXXA Unspecified fall, initial encounter: Secondary | ICD-10-CM | POA: Diagnosis not present

## 2024-02-17 DIAGNOSIS — C7931 Secondary malignant neoplasm of brain: Principal | ICD-10-CM | POA: Diagnosis present

## 2024-02-17 DIAGNOSIS — C799 Secondary malignant neoplasm of unspecified site: Principal | ICD-10-CM

## 2024-02-17 DIAGNOSIS — Z9221 Personal history of antineoplastic chemotherapy: Secondary | ICD-10-CM

## 2024-02-17 DIAGNOSIS — Z66 Do not resuscitate: Secondary | ICD-10-CM | POA: Diagnosis not present

## 2024-02-17 DIAGNOSIS — F419 Anxiety disorder, unspecified: Secondary | ICD-10-CM | POA: Diagnosis present

## 2024-02-17 DIAGNOSIS — Z751 Person awaiting admission to adequate facility elsewhere: Secondary | ICD-10-CM

## 2024-02-17 DIAGNOSIS — J4489 Other specified chronic obstructive pulmonary disease: Secondary | ICD-10-CM | POA: Diagnosis present

## 2024-02-17 DIAGNOSIS — Z955 Presence of coronary angioplasty implant and graft: Secondary | ICD-10-CM | POA: Diagnosis not present

## 2024-02-17 DIAGNOSIS — C771 Secondary and unspecified malignant neoplasm of intrathoracic lymph nodes: Secondary | ICD-10-CM | POA: Diagnosis not present

## 2024-02-17 DIAGNOSIS — M79605 Pain in left leg: Secondary | ICD-10-CM | POA: Diagnosis present

## 2024-02-17 DIAGNOSIS — R2689 Other abnormalities of gait and mobility: Secondary | ICD-10-CM | POA: Diagnosis present

## 2024-02-17 DIAGNOSIS — C801 Malignant (primary) neoplasm, unspecified: Secondary | ICD-10-CM | POA: Diagnosis not present

## 2024-02-17 DIAGNOSIS — Z9181 History of falling: Secondary | ICD-10-CM

## 2024-02-17 DIAGNOSIS — Z789 Other specified health status: Secondary | ICD-10-CM

## 2024-02-17 DIAGNOSIS — M79662 Pain in left lower leg: Secondary | ICD-10-CM | POA: Diagnosis not present

## 2024-02-17 DIAGNOSIS — Z89511 Acquired absence of right leg below knee: Secondary | ICD-10-CM

## 2024-02-17 DIAGNOSIS — L89153 Pressure ulcer of sacral region, stage 3: Secondary | ICD-10-CM | POA: Diagnosis present

## 2024-02-17 DIAGNOSIS — I700 Atherosclerosis of aorta: Secondary | ICD-10-CM | POA: Diagnosis not present

## 2024-02-17 DIAGNOSIS — I2510 Atherosclerotic heart disease of native coronary artery without angina pectoris: Secondary | ICD-10-CM | POA: Diagnosis present

## 2024-02-17 DIAGNOSIS — R609 Edema, unspecified: Secondary | ICD-10-CM

## 2024-02-17 DIAGNOSIS — I110 Hypertensive heart disease with heart failure: Secondary | ICD-10-CM | POA: Diagnosis not present

## 2024-02-17 DIAGNOSIS — Z923 Personal history of irradiation: Secondary | ICD-10-CM

## 2024-02-17 DIAGNOSIS — C3490 Malignant neoplasm of unspecified part of unspecified bronchus or lung: Secondary | ICD-10-CM | POA: Diagnosis not present

## 2024-02-17 DIAGNOSIS — M19072 Primary osteoarthritis, left ankle and foot: Secondary | ICD-10-CM | POA: Diagnosis not present

## 2024-02-17 DIAGNOSIS — Z91199 Patient's noncompliance with other medical treatment and regimen due to unspecified reason: Secondary | ICD-10-CM

## 2024-02-17 DIAGNOSIS — E7800 Pure hypercholesterolemia, unspecified: Secondary | ICD-10-CM | POA: Diagnosis present

## 2024-02-17 DIAGNOSIS — E039 Hypothyroidism, unspecified: Secondary | ICD-10-CM | POA: Diagnosis not present

## 2024-02-17 DIAGNOSIS — E222 Syndrome of inappropriate secretion of antidiuretic hormone: Secondary | ICD-10-CM | POA: Diagnosis not present

## 2024-02-17 DIAGNOSIS — I255 Ischemic cardiomyopathy: Secondary | ICD-10-CM | POA: Diagnosis not present

## 2024-02-17 DIAGNOSIS — I252 Old myocardial infarction: Secondary | ICD-10-CM

## 2024-02-17 DIAGNOSIS — Z7982 Long term (current) use of aspirin: Secondary | ICD-10-CM

## 2024-02-17 DIAGNOSIS — Z86718 Personal history of other venous thrombosis and embolism: Secondary | ICD-10-CM

## 2024-02-17 DIAGNOSIS — R0781 Pleurodynia: Secondary | ICD-10-CM | POA: Diagnosis not present

## 2024-02-17 DIAGNOSIS — S0990XA Unspecified injury of head, initial encounter: Secondary | ICD-10-CM | POA: Diagnosis not present

## 2024-02-17 DIAGNOSIS — I5022 Chronic systolic (congestive) heart failure: Secondary | ICD-10-CM | POA: Diagnosis not present

## 2024-02-17 DIAGNOSIS — R918 Other nonspecific abnormal finding of lung field: Secondary | ICD-10-CM | POA: Diagnosis not present

## 2024-02-17 DIAGNOSIS — I5020 Unspecified systolic (congestive) heart failure: Secondary | ICD-10-CM | POA: Diagnosis present

## 2024-02-17 DIAGNOSIS — J439 Emphysema, unspecified: Secondary | ICD-10-CM | POA: Diagnosis not present

## 2024-02-17 DIAGNOSIS — G936 Cerebral edema: Secondary | ICD-10-CM | POA: Diagnosis not present

## 2024-02-17 DIAGNOSIS — R0789 Other chest pain: Secondary | ICD-10-CM | POA: Diagnosis present

## 2024-02-17 DIAGNOSIS — I959 Hypotension, unspecified: Secondary | ICD-10-CM | POA: Diagnosis present

## 2024-02-17 DIAGNOSIS — Z515 Encounter for palliative care: Secondary | ICD-10-CM | POA: Diagnosis not present

## 2024-02-17 DIAGNOSIS — F32A Depression, unspecified: Secondary | ICD-10-CM | POA: Diagnosis present

## 2024-02-17 DIAGNOSIS — C3491 Malignant neoplasm of unspecified part of right bronchus or lung: Secondary | ICD-10-CM | POA: Diagnosis present

## 2024-02-17 DIAGNOSIS — Z79890 Hormone replacement therapy (postmenopausal): Secondary | ICD-10-CM

## 2024-02-17 DIAGNOSIS — I5021 Acute systolic (congestive) heart failure: Secondary | ICD-10-CM | POA: Diagnosis not present

## 2024-02-17 DIAGNOSIS — I70203 Unspecified atherosclerosis of native arteries of extremities, bilateral legs: Secondary | ICD-10-CM | POA: Diagnosis not present

## 2024-02-17 DIAGNOSIS — Z8619 Personal history of other infectious and parasitic diseases: Secondary | ICD-10-CM

## 2024-02-17 DIAGNOSIS — Z79899 Other long term (current) drug therapy: Secondary | ICD-10-CM

## 2024-02-17 DIAGNOSIS — R296 Repeated falls: Secondary | ICD-10-CM | POA: Diagnosis present

## 2024-02-17 DIAGNOSIS — Z7189 Other specified counseling: Secondary | ICD-10-CM

## 2024-02-17 DIAGNOSIS — R0902 Hypoxemia: Secondary | ICD-10-CM | POA: Diagnosis not present

## 2024-02-17 DIAGNOSIS — Z860101 Personal history of adenomatous and serrated colon polyps: Secondary | ICD-10-CM

## 2024-02-17 DIAGNOSIS — Z821 Family history of blindness and visual loss: Secondary | ICD-10-CM

## 2024-02-17 DIAGNOSIS — Z888 Allergy status to other drugs, medicaments and biological substances status: Secondary | ICD-10-CM

## 2024-02-17 DIAGNOSIS — R260 Ataxic gait: Secondary | ICD-10-CM | POA: Diagnosis present

## 2024-02-17 DIAGNOSIS — R253 Fasciculation: Secondary | ICD-10-CM | POA: Diagnosis present

## 2024-02-17 DIAGNOSIS — R54 Age-related physical debility: Secondary | ICD-10-CM | POA: Diagnosis present

## 2024-02-17 LAB — COMPREHENSIVE METABOLIC PANEL WITH GFR
ALT: 8 U/L (ref 0–44)
AST: 14 U/L — ABNORMAL LOW (ref 15–41)
Albumin: 2.8 g/dL — ABNORMAL LOW (ref 3.5–5.0)
Alkaline Phosphatase: 82 U/L (ref 38–126)
Anion gap: 10 (ref 5–15)
BUN: <5 mg/dL — ABNORMAL LOW (ref 8–23)
CO2: 27 mmol/L (ref 22–32)
Calcium: 8.2 mg/dL — ABNORMAL LOW (ref 8.9–10.3)
Chloride: 96 mmol/L — ABNORMAL LOW (ref 98–111)
Creatinine, Ser: 0.65 mg/dL (ref 0.61–1.24)
GFR, Estimated: >60 mL/min (ref >60)
Glucose, Bld: 92 mg/dL (ref 70–99)
Potassium: 3.6 mmol/L (ref 3.5–5.1)
Sodium: 133 mmol/L — ABNORMAL LOW (ref 135–145)
Total Bilirubin: 0.6 mg/dL (ref 0.0–1.2)
Total Protein: 5.7 g/dL — ABNORMAL LOW (ref 6.5–8.1)

## 2024-02-17 LAB — CBC WITH DIFFERENTIAL/PLATELET
Abs Immature Granulocytes: 0.11 10*3/uL — ABNORMAL HIGH (ref 0.00–0.07)
Basophils Absolute: 0.1 10*3/uL (ref 0.0–0.1)
Basophils Relative: 1 %
Eosinophils Absolute: 0.1 10*3/uL (ref 0.0–0.5)
Eosinophils Relative: 1 %
HCT: 37.7 % — ABNORMAL LOW (ref 39.0–52.0)
Hemoglobin: 12.2 g/dL — ABNORMAL LOW (ref 13.0–17.0)
Immature Granulocytes: 1 %
Lymphocytes Relative: 9 %
Lymphs Abs: 1.1 10*3/uL (ref 0.7–4.0)
MCH: 27.5 pg (ref 26.0–34.0)
MCHC: 32.4 g/dL (ref 30.0–36.0)
MCV: 84.9 fL (ref 80.0–100.0)
Monocytes Absolute: 0.9 10*3/uL (ref 0.1–1.0)
Monocytes Relative: 7 %
Neutro Abs: 10.8 10*3/uL — ABNORMAL HIGH (ref 1.7–7.7)
Neutrophils Relative %: 81 %
Platelets: 416 10*3/uL — ABNORMAL HIGH (ref 150–400)
RBC: 4.44 MIL/uL (ref 4.22–5.81)
RDW: 14.8 % (ref 11.5–15.5)
WBC: 13.1 10*3/uL — ABNORMAL HIGH (ref 4.0–10.5)
nRBC: 0 % (ref 0.0–0.2)

## 2024-02-17 LAB — I-STAT CG4 LACTIC ACID, ED: Lactic Acid, Venous: 1 mmol/L (ref 0.5–1.9)

## 2024-02-17 MED ORDER — SODIUM CHLORIDE 0.9% FLUSH
10.0000 mL | Freq: Two times a day (BID) | INTRAVENOUS | Status: DC
  Administered 2024-02-17 – 2024-02-27 (×20): 10 mL

## 2024-02-17 MED ORDER — LEVOTHYROXINE SODIUM 75 MCG PO TABS
175.0000 ug | ORAL_TABLET | Freq: Every day | ORAL | Status: DC
  Administered 2024-02-18 – 2024-02-27 (×10): 175 ug via ORAL
  Filled 2024-02-17 (×3): qty 1
  Filled 2024-02-17 (×2): qty 3
  Filled 2024-02-17: qty 1
  Filled 2024-02-17 (×2): qty 3
  Filled 2024-02-17 (×2): qty 1
  Filled 2024-02-17: qty 3

## 2024-02-17 MED ORDER — HYDROCODONE-ACETAMINOPHEN 10-325 MG PO TABS
1.0000 | ORAL_TABLET | ORAL | Status: DC | PRN
  Administered 2024-02-18 – 2024-02-27 (×11): 1 via ORAL
  Filled 2024-02-17 (×12): qty 1

## 2024-02-17 MED ORDER — AMITRIPTYLINE HCL 50 MG PO TABS
100.0000 mg | ORAL_TABLET | Freq: Every day | ORAL | Status: DC
  Filled 2024-02-17: qty 2

## 2024-02-17 MED ORDER — IOHEXOL 350 MG/ML SOLN
75.0000 mL | Freq: Once | INTRAVENOUS | Status: AC | PRN
  Administered 2024-02-17: 75 mL via INTRAVENOUS

## 2024-02-17 MED ORDER — NICOTINE 14 MG/24HR TD PT24
14.0000 mg | MEDICATED_PATCH | Freq: Every day | TRANSDERMAL | Status: DC
  Administered 2024-02-17 – 2024-02-27 (×11): 14 mg via TRANSDERMAL
  Filled 2024-02-17 (×11): qty 1

## 2024-02-17 MED ORDER — DEXAMETHASONE SODIUM PHOSPHATE 4 MG/ML IJ SOLN
4.0000 mg | Freq: Four times a day (QID) | INTRAMUSCULAR | Status: DC
  Administered 2024-02-17 – 2024-02-21 (×16): 4 mg via INTRAVENOUS
  Filled 2024-02-17 (×16): qty 1

## 2024-02-17 MED ORDER — FUROSEMIDE 20 MG PO TABS: 20.0000 mg | ORAL_TABLET | Freq: Every day | ORAL | Status: DC

## 2024-02-17 MED ORDER — ONDANSETRON HCL 4 MG PO TABS: 4.0000 mg | ORAL_TABLET | Freq: Four times a day (QID) | ORAL | Status: DC | PRN

## 2024-02-17 MED ORDER — MIDODRINE HCL 5 MG PO TABS
10.0000 mg | ORAL_TABLET | Freq: Three times a day (TID) | ORAL | Status: DC
  Administered 2024-02-17 – 2024-02-18 (×2): 10 mg via ORAL
  Filled 2024-02-17 (×3): qty 2

## 2024-02-17 MED ORDER — ONDANSETRON HCL 4 MG/2ML IJ SOLN: 4.0000 mg | Freq: Four times a day (QID) | INTRAMUSCULAR | Status: DC | PRN

## 2024-02-17 MED ORDER — ACETAMINOPHEN 650 MG RE SUPP: 650.0000 mg | Freq: Four times a day (QID) | RECTAL | Status: DC | PRN

## 2024-02-17 MED ORDER — ACETAMINOPHEN 325 MG PO TABS
650.0000 mg | ORAL_TABLET | Freq: Four times a day (QID) | ORAL | Status: DC | PRN
  Administered 2024-02-21 – 2024-02-25 (×5): 650 mg via ORAL
  Filled 2024-02-17 (×6): qty 2

## 2024-02-17 MED ORDER — SODIUM CHLORIDE 0.9 % IV BOLUS
1000.0000 mL | Freq: Once | INTRAVENOUS | Status: AC
  Administered 2024-02-17: 1000 mL via INTRAVENOUS

## 2024-02-17 MED ORDER — AMITRIPTYLINE HCL 25 MG PO TABS
100.0000 mg | ORAL_TABLET | Freq: Every day | ORAL | Status: DC
Start: 2024-02-17 — End: 2024-02-27
  Administered 2024-02-17 – 2024-02-26 (×10): 100 mg via ORAL
  Filled 2024-02-17: qty 4
  Filled 2024-02-17: qty 2
  Filled 2024-02-17: qty 4
  Filled 2024-02-17: qty 2
  Filled 2024-02-17 (×2): qty 4
  Filled 2024-02-17: qty 2
  Filled 2024-02-17 (×2): qty 4

## 2024-02-17 MED ORDER — CHLORHEXIDINE GLUCONATE CLOTH 2 % EX PADS
6.0000 | MEDICATED_PAD | Freq: Every day | CUTANEOUS | Status: DC
  Administered 2024-02-18 – 2024-02-27 (×10): 6 via TOPICAL

## 2024-02-17 MED ORDER — CITALOPRAM HYDROBROMIDE 20 MG PO TABS
40.0000 mg | ORAL_TABLET | Freq: Every day | ORAL | Status: DC
  Administered 2024-02-17 – 2024-02-27 (×11): 40 mg via ORAL
  Filled 2024-02-17 (×11): qty 2

## 2024-02-17 MED ORDER — LEVOTHYROXINE SODIUM 25 MCG PO TABS: 125.0000 ug | ORAL_TABLET | Freq: Every day | ORAL | Status: DC

## 2024-02-17 MED ORDER — SODIUM CHLORIDE 0.9% FLUSH: 10.0000 mL | INTRAVENOUS | Status: DC | PRN

## 2024-02-17 MED ORDER — ALPRAZOLAM 0.5 MG PO TABS
2.0000 mg | ORAL_TABLET | Freq: Three times a day (TID) | ORAL | Status: DC | PRN
  Administered 2024-02-17 – 2024-02-26 (×16): 2 mg via ORAL
  Filled 2024-02-17 (×17): qty 4

## 2024-02-17 NOTE — ED Notes (Signed)
 Phlebotomy contacted regarding blood culture draw.

## 2024-02-17 NOTE — ED Notes (Signed)
 Vascular at bedside

## 2024-02-17 NOTE — ED Triage Notes (Signed)
 Pt BIB [facility] from home for L sided leg pain/edema along with L sided rib cage pain after a fall yesterday. R BKA. No thinners.  92/60 HR 75 97% RA Cbg 125

## 2024-02-17 NOTE — Progress Notes (Signed)
 Pt off unit for CT

## 2024-02-17 NOTE — ED Provider Notes (Signed)
 MRI techs report cannot do pacemaker compatible MRI until Tuesday (3 days).     Arvilla Birmingham, MD 02/17/24 [phone]

## 2024-02-17 NOTE — Progress Notes (Signed)
 Left lower extremity venous duplex has been completed. Preliminary results can be found in CV Proc through chart review.  Results were given to Dr. Monnie Anthony.  02/17/24 2:36 PM Birda Buffy RVT

## 2024-02-17 NOTE — H&P (Signed)
 [facility] History and Physical  MUSA REWERTS NGE:[id number] DOB: November 06, 1960 DOA: 02/17/2024   PCP: [name], MD  Specialists: Patient has been followed on and off by Dr. Marguerita Shih with medical oncology  Chief Complaint: Fall due to imbalance  HPI: JOSHUAL TERRIO is a 63 y.o. male with a past medical history of non-small cell lung cancer, hypothyroidism, chronic systolic CHF, coronary artery disease with PCI to LAD in 2017, who was trying to go up the ramp at his house using a walker when he felt his left leg to give out and he as result fell.  He has a history of right below-knee amputation as well.  Patient experienced persistent pain in his left leg as a result of his fall which occurred yesterday.  He decided to come into the hospital today for evaluation.  Evaluation in the ED raised concern for metastatic disease in his brain.  Cerebral edema was noted.  He was subsequently hospitalized.  Patient denies any headache.  He is not a very good historian.  He has been having some vision issues and imbalance issues for the past few weeks.  Has had falls at home as a result.  He currently denies any chest pain shortness of breath.  He does not remember the last time he was treated for his cancer.  Based on EMR he has not been seen by Dr. Marguerita Shih since 2023.  Home Medications: This list is not reconciled yet. Prior to Admission medications   Medication Sig Start Date End Date Taking? Authorizing Provider  albuterol  (VENTOLIN  HFA) 108 (90 Base) MCG/ACT inhaler Inhale 1-2 puffs into the lungs every 4 (four) hours as needed for wheezing or shortness of breath. 06/30/20   [provider]  alprazolam  (XANAX ) 2 MG tablet Take 2 mg by mouth 4 (four) times daily as needed for anxiety.    [provider]  amitriptyline  (ELAVIL ) 100 MG tablet Take 100 mg by mouth at bedtime.     [provider]  aspirin  81 MG EC tablet Take 81 mg by mouth daily.    [provider]   atorvastatin  (LIPITOR ) 80 MG tablet Take 80 mg by mouth daily. 06/06/21   [provider]  citalopram  (CELEXA ) 40 MG tablet Take 40 mg by mouth daily. 12/02/14   [provider]  dexamethasone  (DECADRON ) 4 MG tablet Please take 1 tablet twice a day the day before, the day of, and the day after treatment Patient taking differently: Take 4 mg by mouth See admin instructions. Please take 1 tablet twice a day the day before, the day of, and the day after treatment 06/16/21   Heilingoetter, Cassandra L, PA-C  folic acid  (FOLVITE ) 1 MG tablet Take 1 tablet (1 mg total) by mouth daily. 02/24/21   Heilingoetter, Cassandra L, PA-C  HYDROcodone -acetaminophen  (NORCO) 10-325 MG tablet Take 1 tablet by mouth every 4 (four) hours as needed for moderate pain. 10/09/21   Rai, Hurman Maiden, MD  levothyroxine  (SYNTHROID ) 125 MCG tablet Take 125 mcg by mouth daily. 12/04/20   [provider]  lidocaine -prilocaine  (EMLA ) cream Apply 1 application topically as needed. Patient taking differently: Apply 1 application topically daily as needed (port access). 05/05/21   Marlene Simas, MD  nitroGLYCERIN  (NITROSTAT ) 0.4 MG SL tablet Place 1 tablet (0.4 mg total) under the tongue every 5 (five) minutes x 3 doses as needed for chest pain. Patient not taking: Reported on 10/14/2021 05/28/16   Ruthellen Cowden, NP  prochlorperazine  (COMPAZINE ) 10  MG tablet Take 1 tablet (10 mg total) by mouth every 6 (six) hours as needed for nausea or vomiting. Patient not taking: Reported on 10/14/2021 02/24/21   Heilingoetter, Cassandra L, PA-C  sucralfate  (CARAFATE ) 1 g tablet Take 1 tablet (1 g total) by mouth 4 (four) times daily -  with meals and at bedtime. 5 min before meals for radiation induced esophagitis Patient taking differently: Take 1 g by mouth daily. 04/14/18   Kenith Payer, MD  tamsulosin  (FLOMAX ) 0.4 MG CAPS capsule Take 0.4 mg by mouth daily. 01/29/18   [provider]  TRELEGY ELLIPTA 100-62.5-25  MCG/INH AEPB Inhale 1 puff into the lungs daily. 12/13/20   [provider]    Allergies:  Allergies  Allergen Reactions   Lyrica [Pregabalin] Palpitations    Chest pain and heart palps.   Neurontin [Gabapentin] Palpitations    Chest pain and heart palp    Past Medical History: Past Medical History:  Diagnosis Date   Acute hepatitis C virus infection 06/19/2007   Qualifier: Diagnosis of  By: Denver Flaming CMA, Tiffany     Acute ST elevation myocardial infarction (STEMI) involving left anterior descending (LAD) coronary artery (HCC) 05/26/2016   Acute ST elevation myocardial infarction (STEMI) of lateral wall (HCC) 05/26/2016   AICD (automatic cardioverter/defibrillator) present 03/02/2018   Anxiety    Asthma    Atherosclerosis of native arteries of the extremities with intermittent claudication 12/16/2011   Chronic hepatitis C without hepatic coma (HCC) 05/03/2014   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/2018   FEV1 57%   Depression    DVT (deep venous thrombosis) (HCC) 2009; 2019   RLE; LLE   Encounter for antineoplastic chemotherapy 03/22/2018   Hepatitis C    "tx'd in 2015"   High cholesterol    Ischemic cardiomyopathy 03/02/2018   Non-small cell carcinoma of right lung, stage 3 (HCC) 03/22/2018   PAD (peripheral artery disease) (HCC) 01/25/2013   Peripheral vascular disease, unspecified 07/20/2012   Port-A-Cath in place 04/24/2018   ST elevation myocardial infarction involving left anterior descending (LAD) coronary artery (HCC)    STEMI (ST elevation myocardial infarction) (HCC) 05/26/2016   Tobacco abuse 01/28/2016   Tubular adenoma of colon 07/11/2014   Urinary dribbling     Past Surgical History:  Procedure Laterality Date   AORTOGRAM  08/08/2009   for LLE claudication     By Dr. Nolene Baumgarten   BELOW KNEE LEG AMPUTATION Right 04/25/2008   BIOPSY N/A 05/30/2014   Procedure: BIOPSY;  Surgeon: Suzette Espy, MD;  Location: [facility];  Service: Endoscopy;  Laterality: N/A;    BLADDER TUMOR EXCISION  8/812   CARDIAC CATHETERIZATION N/A 05/26/2016   Procedure: Left Heart Cath and Coronary Angiography;  Surgeon: Millicent Ally, MD;  Location: [facility] INVASIVE CV LAB;  Service: Cardiovascular;  Laterality: N/A;   CARDIAC CATHETERIZATION N/A 05/26/2016   Procedure: Coronary Stent Intervention;  Surgeon: Millicent Ally, MD;  Location: [facility] INVASIVE CV LAB;  Service: Cardiovascular;  Laterality: N/A;   COLONOSCOPY WITH PROPOFOL  N/A 05/30/2014   ION:GEXBMW colonic polyp-likely source of hematochezia-removed as described above   ESOPHAGOGASTRODUODENOSCOPY (EGD) WITH PROPOFOL  N/A 05/30/2014   UXL:KGMWNU and bulbar erosions s/p gastric biopsy. No evidence of portal gastropathy on today's examination.   FEMORAL-TIBIAL BYPASS GRAFT  2009   Right side using non-reversed GSV   By Dr. Kittie Perking BYPASS GRAFT  11/24/2007   Right femoral to anterior tibial BPG   by Dr. Nolene Baumgarten  FINGER SURGERY Left    "straightened my pinky"   HAND TENDON SURGERY Left 2013   Left 5th finger   HERNIA REPAIR     "stomach"   ICD IMPLANT N/A 03/02/2018   Procedure: ICD IMPLANT;  Surgeon: Lei Pump, MD;  Location: [facility] INVASIVE CV LAB;  Service: Cardiovascular;  Laterality: N/A;   INCISIONAL HERNIA REPAIR N/A 12/25/2014   Procedure: HERNIA REPAIR INCISIONAL WITH MESH;  Surgeon: Alanda Allegra Md, MD;  Location: [facility];  Service: General;  Laterality: N/A;   INSERTION OF MESH N/A 12/25/2014   Procedure: INSERTION OF MESH;  Surgeon: Alanda Allegra Md, MD;  Location: [facility];  Service: General;  Laterality: N/A;   IR IMAGING GUIDED PORT INSERTION  04/11/2018   LAPAROSCOPIC CHOLECYSTECTOMY     LOWER EXTREMITY ANGIOGRAM Left 12/30/2015   Procedure: Lower Extremity Angiogram;  Surgeon: Richrd Char, MD;  Location: [facility] INVASIVE CV LAB;  Service: Cardiovascular;  Laterality: Left;   PERIPHERAL VASCULAR CATHETERIZATION N/A 12/30/2015   Procedure: Abdominal Aortogram;  Surgeon: Richrd Char, MD;   Location[facility] INVASIVE CV LAB;  Service: Cardiovascular;  Laterality: N/A;   POLYPECTOMY  05/30/2014   Procedure: POLYPECTOMY;  Surgeon: Suzette Espy, MD;  Location: [facility];  Service: Endoscopy;;   TONSILLECTOMY AND ADENOIDECTOMY  ~ 1970   TYMPANOSTOMY TUBE PLACEMENT Bilateral ~ 1970   VIDEO ASSISTED THORACOSCOPY Left 10/04/2021   Procedure: PERICARDIAL WINDOW VIDEO ASSISTED THORACOSCOPY APPROACH;  Surgeon: Hilarie Lovely, MD;  Location: [facility];  Service: Thoracic;  Laterality: Left;  pericardial window.  full lateral    Social History: Lives with his wife.  Continues to smoke half to 1 pack of cigarettes on a daily basis.  Denies any alcohol  use.  Uses marijuana.  Has a prosthesis for his right lower extremity.  Uses a walker to ambulate.   Family History:  Family History  Problem Relation Age of Onset   Vision loss Mother    Ulcers Father    Colon cancer Neg Hx      Review of Systems - History obtained from the patient General ROS: positive for  - fatigue Psychological ROS: negative Ophthalmic ROS: Nonspecific visual disturbances ENT ROS: negative Allergy and Immunology ROS: negative Hematological and Lymphatic ROS: negative Endocrine ROS: negative Respiratory ROS: no cough, shortness of breath, or wheezing Cardiovascular ROS: no chest pain or dyspnea on exertion Gastrointestinal ROS: no abdominal pain, change in bowel habits, or black or bloody stools Genito-Urinary ROS: no dysuria, trouble voiding, or hematuria Musculoskeletal ROS: negative Neurological ROS: no TIA or stroke symptoms Dermatological ROS: negative  Physical Examination  Vitals:   02/17/24 1350 02/17/24 1357 02/17/24 1638  BP:  (!) 86/58 95/69  Pulse:  95 85  Resp:  16 15  Temp:  98.6 F (37 C)   TempSrc:  Oral   SpO2:  95% 98%  Weight: 87 kg    Height: 6' (1.829 m)      BP 95/69   Pulse 85   Temp 98.6 F (37 C) (Oral)   Resp 15   Ht 6' (1.829 m)   Wt 87 kg   SpO2 98%   BMI 26.01 kg/m    General appearance: alert, cooperative, appears stated age, and no distress Head: Normocephalic, without obvious abnormality, atraumatic Eyes: conjunctivae/corneas clear. PERRL, EOM's intact.  Neck: no adenopathy, no carotid bruit, no JVD, supple, symmetrical, trachea midline, and thyroid  not enlarged, symmetric, no tenderness/mass/nodules Back: symmetric, no curvature. ROM normal. No CVA  tenderness. Resp: Normal effort at rest.  Coarse breath sounds bilaterally.  Few scattered wheezes.  Few crackles at the bases. Cardio: regular rate and rhythm, S1, S2 normal, no murmur, click, rub or gallop GI: Abdomen is soft.  Possible hepatomegaly there was a difficult exam.  Nontender.  Nondistended. Extremities: Mild edema left lower extremity Pulses: Pedal pulses palpable in the left lower extremity Skin: Patient has a stage II decubitus in the coccygeal area Lymph nodes: Cervical, supraclavicular, and axillary nodes normal. Neurologic: He is awake alert.  Somewhat distracted.  No obvious focal neurological deficits noted.  Physical deconditioning is noted.   Labs on Admission: I have personally reviewed following labs and imaging studies  CBC: Recent Labs  Lab 02/17/24 1553  WBC 13.1*  NEUTROABS 10.8*  HGB 12.2*  HCT 37.7*  MCV 84.9  PLT 416*   Basic Metabolic Panel: Recent Labs  Lab 02/17/24 1553  NA 133*  K 3.6  CL 96*  CO2 27  GLUCOSE 92  BUN <5*  CREATININE 0.65  CALCIUM  8.2*   GFR: Estimated Creatinine Clearance: 106.4 mL/min (by C-G formula based on SCr of 0.65 mg/dL). Liver Function Tests: Recent Labs  Lab 02/17/24 1553  AST 14*  ALT 8  ALKPHOS 82  BILITOT 0.6  PROT 5.7*  ALBUMIN  2.8*     Radiological Exams on Admission: CT Head Wo Contrast Result Date: 02/17/2024 CLINICAL DATA:  Trauma, fall. EXAM: CT HEAD WITHOUT CONTRAST CT CERVICAL SPINE WITHOUT CONTRAST TECHNIQUE: Multidetector CT imaging of the head and cervical spine was performed following the  standard protocol without intravenous contrast. Multiplanar CT image reconstructions of the cervical spine were also generated. RADIATION DOSE REDUCTION: This exam was performed according to the departmental dose-optimization program which includes automated exposure control, adjustment of the mA and/or kV according to patient size and/or use of iterative reconstruction technique. COMPARISON:  CT head 03/28/2021. FINDINGS: CT HEAD FINDINGS Brain: No acute intracranial hemorrhage. There is vasogenic edema within the left occipital lobe extending into the posterior left temporal lobe. Suggestion of a heterogeneous 1.2 cm masslike lesion within this region. Additional edema within the left cerebellar hemisphere with an associated 1.4 cm lesion. Edema extends into the cerebellar vermis with associated mass effect and partial effacement of the fourth ventricle. There is also partial effacement of the quadrigeminal cistern more pronounced on the left. Additional foci of vasogenic edema in the left parietal lobe and anterior right frontal lobe. Partial effacement of the left occipital and temporal horns. No hydrocephalus. No midline shift. Vascular: No hyperdense vessel or unexpected calcification. Skull: Normal. Negative for fracture or focal lesion. Sinuses/Orbits: No acute finding. Orbits are symmetric. Mucosal thickening and mucous retention cysts in the maxillary sinuses. Other: None. CT CERVICAL SPINE FINDINGS Alignment: Cervical lordosis is maintained. Trace stepwise retrolisthesis of C4 on C5, C5 on C6, and C6 on C7. No facet subluxation or dislocation. Skull base and vertebrae: No compression fracture or displaced fracture in the cervical spine. Soft tissues and spinal canal: No prevertebral fluid or swelling. No visible canal hematoma. Disc levels: Mild disc space narrowing at multiple levels. There are small disc bulges throughout the cervical spine. Mild spinal canal stenosis at C4-5. Facet arthrosis at  multiple levels most pronounced on the left at C3-4. No high-grade osseous foraminal narrowing. Upper chest: Complete opacification of the partially visualized right lung apex. Right-sided central venous catheter. Emphysema in the left lung apex. Other: None. IMPRESSION: Vasogenic edema in the left occipital lobe and left cerebellum with  associated mass lesions. Additional vasogenic edema in the left parietal lobe and anterior right frontal lobe. Findings concerning for metastatic disease. Recommend contrast enhanced MRI brain for further evaluation. Edema in the cerebellum results in partial effacement of the fourth ventricle and quadrigeminal cistern. No hydrocephalus. No acute intracranial hemorrhage. No acute fracture or traumatic malalignment of the cervical spine. Complete opacification of the right lung apex. Emphysema in the left lung apex. Electronically Signed   By: Denny Flack M.D.   On: 02/17/2024 15:42   CT Cervical Spine Wo Contrast Result Date: 02/17/2024 CLINICAL DATA:  Trauma, fall. EXAM: CT HEAD WITHOUT CONTRAST CT CERVICAL SPINE WITHOUT CONTRAST TECHNIQUE: Multidetector CT imaging of the head and cervical spine was performed following the standard protocol without intravenous contrast. Multiplanar CT image reconstructions of the cervical spine were also generated. RADIATION DOSE REDUCTION: This exam was performed according to the departmental dose-optimization program which includes automated exposure control, adjustment of the mA and/or kV according to patient size and/or use of iterative reconstruction technique. COMPARISON:  CT head 03/28/2021. FINDINGS: CT HEAD FINDINGS Brain: No acute intracranial hemorrhage. There is vasogenic edema within the left occipital lobe extending into the posterior left temporal lobe. Suggestion of a heterogeneous 1.2 cm masslike lesion within this region. Additional edema within the left cerebellar hemisphere with an associated 1.4 cm lesion. Edema extends  into the cerebellar vermis with associated mass effect and partial effacement of the fourth ventricle. There is also partial effacement of the quadrigeminal cistern more pronounced on the left. Additional foci of vasogenic edema in the left parietal lobe and anterior right frontal lobe. Partial effacement of the left occipital and temporal horns. No hydrocephalus. No midline shift. Vascular: No hyperdense vessel or unexpected calcification. Skull: Normal. Negative for fracture or focal lesion. Sinuses/Orbits: No acute finding. Orbits are symmetric. Mucosal thickening and mucous retention cysts in the maxillary sinuses. Other: None. CT CERVICAL SPINE FINDINGS Alignment: Cervical lordosis is maintained. Trace stepwise retrolisthesis of C4 on C5, C5 on C6, and C6 on C7. No facet subluxation or dislocation. Skull base and vertebrae: No compression fracture or displaced fracture in the cervical spine. Soft tissues and spinal canal: No prevertebral fluid or swelling. No visible canal hematoma. Disc levels: Mild disc space narrowing at multiple levels. There are small disc bulges throughout the cervical spine. Mild spinal canal stenosis at C4-5. Facet arthrosis at multiple levels most pronounced on the left at C3-4. No high-grade osseous foraminal narrowing. Upper chest: Complete opacification of the partially visualized right lung apex. Right-sided central venous catheter. Emphysema in the left lung apex. Other: None. IMPRESSION: Vasogenic edema in the left occipital lobe and left cerebellum with associated mass lesions. Additional vasogenic edema in the left parietal lobe and anterior right frontal lobe. Findings concerning for metastatic disease. Recommend contrast enhanced MRI brain for further evaluation. Edema in the cerebellum results in partial effacement of the fourth ventricle and quadrigeminal cistern. No hydrocephalus. No acute intracranial hemorrhage. No acute fracture or traumatic malalignment of the cervical  spine. Complete opacification of the right lung apex. Emphysema in the left lung apex. Electronically Signed   By: Denny Flack M.D.   On: 02/17/2024 15:42   DG Foot Complete Left Result Date: 02/17/2024 CLINICAL DATA:  Pain after fall. EXAM: LEFT FOOT - COMPLETE 3+ VIEW COMPARISON:  07/07/2023 FINDINGS: There is no evidence of fracture or dislocation. Again seen degenerative changes of the first metatarsal phalangeal joint and dorsal midfoot. Soft tissues are unremarkable. IMPRESSION: No fracture or  dislocation of the left foot. Electronically Signed   By: Chadwick Colonel M.D.   On: 02/17/2024 15:32   DG Ribs Unilateral W/Chest Left Result Date: 02/17/2024 CLINICAL DATA:  Pain after fall. EXAM: LEFT RIBS AND CHEST - 3+ VIEW COMPARISON:  Chest radiograph 10/09/2021 FINDINGS: The chest view is coned and excludes the right lateral aspect of the hemithorax. No acute left rib fracture. No evidence of rib lesion or bone destruction. Post treatment related changes at the right lung apex with chronic apical opacity. Right chest port remains in place. Left-sided pacemaker remains in place. The left lung is hyperinflated. No pneumothorax or pleural effusion. Stable heart size and mediastinal contours. IMPRESSION: 1. No acute left rib fracture or pulmonary complication related to fall. 2. Chronic and stable post treatment related changes at the right lung apex. Electronically Signed   By: Chadwick Colonel M.D.   On: 02/17/2024 15:31   DG Tibia/Fibula Left Result Date: 02/17/2024 CLINICAL DATA:  Pain after fall. EXAM: LEFT TIBIA AND FIBULA - 2 VIEW COMPARISON:  None Available. FINDINGS: There is no evidence of fracture or other focal bone lesions. Cortical margins of the tibia and fibula are intact. Knee and ankle alignment are maintained. Small surgical clips in the posteromedial soft tissues. IMPRESSION: Negative radiographs of the left tibia and fibula. Electronically Signed   By: Chadwick Colonel M.D.   On:  02/17/2024 15:30   VAS US  LOWER EXTREMITY VENOUS (DVT) (7a-7p) Result Date: 02/17/2024  Lower Venous DVT Study Patient Name:  HAYES CZAJA  Date of Exam:   02/17/2024 Medical Rec #: [id number]    Accession #:    [id number] Date of Birth: Dec 08, 1960    Patient Gender: M Patient Age:   13 years Exam Location:  [facility] Procedure:      VAS US  LOWER EXTREMITY VENOUS (DVT) Referring Phys: Aleatha Hunting --------------------------------------------------------------------------------  Indications: Edema.  Comparison Study: No prior studies. Performing Technologist: Lerry Ransom RVT  Examination Guidelines: A complete evaluation includes B-mode imaging, spectral Doppler, color Doppler, and power Doppler as needed of all accessible portions of each vessel. Bilateral testing is considered an integral part of a complete examination. Limited examinations for reoccurring indications may be performed as noted. The reflux portion of the exam is performed with the patient in reverse Trendelenburg.  +-----+---------------+---------+-----------+----------+--------------+ RIGHTCompressibilityPhasicitySpontaneityPropertiesThrombus Aging +-----+---------------+---------+-----------+----------+--------------+ CFV  Full           Yes      Yes                                 +-----+---------------+---------+-----------+----------+--------------+   +---------+---------------+---------+-----------+----------+--------------+ LEFT     CompressibilityPhasicitySpontaneityPropertiesThrombus Aging +---------+---------------+---------+-----------+----------+--------------+ CFV      Full           Yes      Yes                                 +---------+---------------+---------+-----------+----------+--------------+ SFJ      Full                                                        +---------+---------------+---------+-----------+----------+--------------+ FV Prox  Full                                                         +---------+---------------+---------+-----------+----------+--------------+  FV Mid   Full                                                        +---------+---------------+---------+-----------+----------+--------------+ FV DistalFull                                                        +---------+---------------+---------+-----------+----------+--------------+ PFV      Full                                                        +---------+---------------+---------+-----------+----------+--------------+ POP      Full           Yes      Yes                                 +---------+---------------+---------+-----------+----------+--------------+ PTV      Full                                                        +---------+---------------+---------+-----------+----------+--------------+ PERO     Full                                                        +---------+---------------+---------+-----------+----------+--------------+     Summary: RIGHT: - No evidence of common femoral vein obstruction.   LEFT: - There is no evidence of deep vein thrombosis in the lower extremity.  - No cystic structure found in the popliteal fossa.  *See table(s) above for measurements and observations.    Preliminary     My interpretation of Electrocardiogram: Sinus rhythm in the 80s.  Normal axis.  Intraventricular conduction delay is noted.   Problem List  Principal Problem:   Imbalance Active Problems:   CAD (coronary artery disease)   Non-small cell carcinoma of right lung, stage 3 (HCC)   Hypothyroidism   HFrEF (heart failure with reduced ejection fraction) (HCC)   S/P implantation of automatic cardioverter/defibrillator (AICD)   Lung cancer metastatic to brain [facility])   Assessment: This is a 63 year old male with past medical history as stated earlier who comes after sustaining a fall at home due to imbalance over the past few days to  weeks.  He is found to have new metastatic lesions in his brain which could be the reason for his balance issues and falls.  Vasogenic edema is noted on imaging studies.  He has a history of non-small cell lung cancer which is currently not being treated.  He has been noncompliant with follow-ups at the cancer center.  Plan: #1. Imbalance most likely due to brain lesions: PT and  OT evaluation.  Steroids will be ordered as discussed below.  Imaging studies of the left leg ribs and left foot did not show any obvious traumatic injury.  Doppler studies were negative for DVT.  #2. Non-small cell lung cancer now with brain metastases: Patient has not been following up with his oncologist.  Based on records it appears that his last treatment was in 2023.  At that time plan was for observation but he was lost to follow-up.  Now with CT head showing brain lesions.  Will place him on dexamethasone .  He has not had any seizure activity and so no clear indication for antiepileptics.  Palliative care will be consulted for goals of care.  Will need to involve oncology to see if there is any treatment option and for prognostication but will likely need to wait till Dr. Marguerita Shih is available.  If patient stabilizes over the next 24 to 48 hours then this could potentially be done in the outpatient setting.  MRI brain has been ordered which unfortunately cannot be done until Tuesday due to presence of defibrillator.  Will proceed with CT scan of chest abdomen pelvis to see if there is any other metastatic process.  #3. History of chronic systolic CHF status post ICD in situ: Last echo from 2023 showed LVEF of 30 to 35%.  He currently does not exhibit any signs of volume overload.  Monitor on telemetry for now.  Will obtain echocardiogram.  Patient initially noted to be hypotensive but blood pressure has improved.  #4.  History of coronary artery disease: Has a stent to LAD in 2017.  Unclear if he takes any antiplatelet agents  at home.  #5.  Hypothyroidism: Check thyroid  function test  #6.  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  DVT Prophylaxis: SCDs Code Status: Patient prefers to be full code for now Family Communication: Discussed with patient.  No family at bedside Disposition: Hopefully return home when stabilized Consults called: Palliative care for now Admission Status: Status is: Observation The patient remains OBS appropriate and will d/c before 2 midnights.    Severity of Illness: The appropriate patient status for this patient is OBSERVATION. Observation status is judged to be reasonable and necessary in order to provide the required intensity of service to ensure the patient's safety. The patient's presenting symptoms, physical exam findings, and initial radiographic and laboratory data in the context of their medical condition is felt to place them at decreased risk for further clinical deterioration. Furthermore, it is anticipated that the patient will be medically stable for discharge from the hospital within 2 midnights of admission.    Further management decisions will depend on results of further testing and patient's response to treatment.   Alena Blankenbeckler  [facility] Pager on [email or web address]  02/17/2024, 5:56 PM

## 2024-02-17 NOTE — ED Notes (Signed)
 Patient to imaging.

## 2024-02-17 NOTE — ED Provider Notes (Signed)
 [facility] Provider Note   CSN: [id number] Arrival date & time: 02/17/24  1346     History  Chief Complaint  Patient presents with   Leg Pain   Fall    [name] is a 63 y.o. male.   Leg Pain Fall     Patient has a history of DVT chronic tobacco use, ACE ICD, STEMI, hepatitis C, peripheral artery disease, and lung cancer.  Patient states he was getting treated with immunotherapy for his cancer.  Patient states the last time he was seen he was told there was nothing more that could be done.  Medical records however indicate as of last month to the cancer center was trying to a follow-up appointment and were unable to reach the patient.  Patient also states he has been having trouble with leg swelling ongoing for the last year or so.  Patient states he did have a surgical procedure performed on the left leg last year.  Patient was not exactly sure of the time.  Records indicate he had treatment at [facility] in September of last year.  Patient states has been having trouble with persistent swelling and pain in his leg.  It hurts for him to stand now.  Patient states his family convinced him finally to come and get checked out.  Patient states he does continue to smoke.  Patient states he also did fall yesterday. Home Medications Prior to Admission medications   Medication Sig Start Date End Date Taking? Authorizing Provider  albuterol  (VENTOLIN  HFA) 108 (90 Base) MCG/ACT inhaler Inhale 1-2 puffs into the lungs every 4 (four) hours as needed for wheezing or shortness of breath. 06/30/20   [provider]  alprazolam  (XANAX ) 2 MG tablet Take 2 mg by mouth 4 (four) times daily as needed for anxiety.    [provider]  amitriptyline  (ELAVIL ) 100 MG tablet Take 100 mg by mouth at bedtime.     [provider]  aspirin  81 MG EC tablet Take 81 mg by mouth daily.    [provider]  atorvastatin  (LIPITOR )  80 MG tablet Take 80 mg by mouth daily. 06/06/21   [provider]  citalopram  (CELEXA ) 40 MG tablet Take 40 mg by mouth daily. 12/02/14   [provider]  dexamethasone  (DECADRON ) 4 MG tablet Please take 1 tablet twice a day the day before, the day of, and the day after treatment Patient taking differently: Take 4 mg by mouth See admin instructions. Please take 1 tablet twice a day the day before, the day of, and the day after treatment 06/16/21   Heilingoetter, Cassandra L, PA-C  folic acid  (FOLVITE ) 1 MG tablet Take 1 tablet (1 mg total) by mouth daily. 02/24/21   Heilingoetter, Cassandra L, PA-C  HYDROcodone -acetaminophen  (NORCO) 10-325 MG tablet Take 1 tablet by mouth every 4 (four) hours as needed for moderate pain. 10/09/21   Rai, Hurman Maiden, MD  levothyroxine  (SYNTHROID ) 125 MCG tablet Take 125 mcg by mouth daily. 12/04/20   [provider]  lidocaine -prilocaine  (EMLA ) cream Apply 1 application topically as needed. Patient taking differently: Apply 1 application topically daily as needed (port access). 05/05/21   Marlene Simas, MD  nitroGLYCERIN  (NITROSTAT ) 0.4 MG SL tablet Place 1 tablet (0.4 mg total) under the tongue every 5 (five) minutes x 3 doses as needed for chest pain. Patient not taking: Reported on 10/14/2021 05/28/16   Ruthellen Cowden, NP  prochlorperazine  (COMPAZINE ) 10  MG tablet Take 1 tablet (10 mg total) by mouth every 6 (six) hours as needed for nausea or vomiting. Patient not taking: Reported on 10/14/2021 02/24/21   Heilingoetter, Cassandra L, PA-C  sucralfate  (CARAFATE ) 1 g tablet Take 1 tablet (1 g total) by mouth 4 (four) times daily -  with meals and at bedtime. 5 min before meals for radiation induced esophagitis Patient taking differently: Take 1 g by mouth daily. 04/14/18   Kenith Payer, MD  tamsulosin  (FLOMAX ) 0.4 MG CAPS capsule Take 0.4 mg by mouth daily. 01/29/18   [provider]  TRELEGY ELLIPTA 100-62.5-25 MCG/INH AEPB Inhale 1 puff  into the lungs daily. 12/13/20   [provider]      Allergies    Lyrica [pregabalin] and Neurontin [gabapentin]    Review of Systems   Review of Systems  Physical Exam Updated Vital Signs BP (!) 86/58   Pulse 95   Temp 98.6 F (37 C) (Oral)   Resp 16   Ht 1.829 m (6')   Wt 87 kg   SpO2 95%   BMI 26.01 kg/m  Physical Exam Vitals and nursing note reviewed.  Constitutional:      Appearance: He is well-developed. He is not diaphoretic.  HENT:     Head: Normocephalic and atraumatic.     Right Ear: External ear normal.     Left Ear: External ear normal.  Eyes:     General: No scleral icterus.       Right eye: No discharge.        Left eye: No discharge.     Conjunctiva/sclera: Conjunctivae normal.  Neck:     Trachea: No tracheal deviation.  Cardiovascular:     Rate and Rhythm: Normal rate and regular rhythm.  Pulmonary:     Effort: Pulmonary effort is normal. No respiratory distress.     Breath sounds: Normal breath sounds. No stridor. No wheezing or rales.  Abdominal:     General: Bowel sounds are normal. There is no distension.     Palpations: Abdomen is soft.     Tenderness: There is no abdominal tenderness. There is no guarding or rebound.  Musculoskeletal:        General: Tenderness present. No deformity.     Cervical back: Neck supple.     Comments: Mild tenderness palpation left foot, no erythema noted, pulses are weak in his foot, cap refill is brisk, extremities are warm  Skin:    General: Skin is warm and dry.     Findings: No rash.  Neurological:     General: No focal deficit present.     Mental Status: He is alert.     Cranial Nerves: No cranial nerve deficit, dysarthria or facial asymmetry.     Sensory: No sensory deficit.     Motor: No abnormal muscle tone or seizure activity.     Coordination: Coordination normal.  Psychiatric:        Mood and Affect: Mood normal.     ED Results / Procedures / Treatments   Labs (all labs ordered are  listed, but only abnormal results are displayed) Labs Reviewed  COMPREHENSIVE METABOLIC PANEL WITH GFR  CBC WITH DIFFERENTIAL/PLATELET  I-STAT CG4 LACTIC ACID, ED    EKG None  Radiology No results found.  Procedures Procedures    Medications Ordered in ED Medications - No data to display  ED Course/ Medical Decision Making/ A&P Clinical Course as of 02/18/24 0818  Fri Feb 17, 2024  1429 Doppler study negative for DVT [JK]  1555 Head CT shows findings concerning for metastatic disease [JK]  1555 Plain films do not show any evidence of rib fractures or fractures in the foot or tib-fib region [JK]    Clinical Course User Index [JK] Trish Furl, MD                                 Medical Decision Making Problems Addressed: Fall, initial encounter: acute illness or injury that poses a threat to life or bodily functions  Amount and/or Complexity of Data Reviewed Labs: ordered. Decision-making details documented in ED Course. Radiology: ordered and independent interpretation performed.  Risk Decision regarding hospitalization.  Patient presented to the ED for evaluation of fall and leg pain primarily with ambulating.  Patient with history of lung cancer.  Prior records reviewed.  Appears patient has not followed up recently with oncology.  Patient states he has been told his cancer is not treatable however previous notes do not clearly document palliative clear plan.  Doppler study did not show any evidence of DVT.  Patient clearly has component of peripheral vascular disease with his prior surgeries and diminished pulses however there is no signs of any acute vascular obstruction.  Patient does not have any cyanosis.  His extremity is warm.  X-rays do not show any signs of serious injury.  CT scan however did show findings concerning for new metastases to the brain.  Care turned over to Dr. Wilhelminia Harbor at shift change.  Anticipate admission to the hospital for further treatment and  evaluation.         Final Clinical Impression(s) / ED Diagnoses Final diagnoses:  None    Rx / DC Orders ED Discharge Orders     None         Trish Furl, MD 02/18/24 [phone]

## 2024-02-17 NOTE — ED Provider Notes (Signed)
 63 yo male w/ hx of DVT, lung cancer with brain mets, here with a fall and leg papin, difficulty ambulating  CT head in Dec 2024 without evidence of malignancy on external care everywhere records, CT here concerning for new mets   Physical Exam  BP (!) 86/58   Pulse 95   Temp 98.6 F (37 C) (Oral)   Resp 16   Ht 6' (1.829 m)   Wt 87 kg   SpO2 95%   BMI 26.01 kg/m   Physical Exam  Procedures  Procedures  ED Course / MDM   Clinical Course as of 02/17/24 1637  Fri Feb 17, 2024  1429 Doppler study negative for DVT [JK]  1555 Head CT shows findings concerning for metastatic disease [JK]  1555 Plain films do not show any evidence of rib fractures or fractures in the foot or tib-fib region [JK]    Clinical Course User Index [JK] Trish Furl, MD   Medical Decision Making Amount and/or Complexity of Data Reviewed Radiology: ordered.  Risk Decision regarding hospitalization.   Patient admitted to hospitalist at 520 pm - plan for MRI imaging and likely consult with oncology and palliative care as patient and his family would benefit from clearer prognostication regarding his current state of cancer, possible hospice or palliative services       Robinette Esters, Janalyn Me, MD 02/17/24 1723

## 2024-02-17 NOTE — Progress Notes (Signed)
Pt returned to the unit from CT scan.

## 2024-02-17 NOTE — ED Provider Triage Note (Signed)
 Emergency Medicine Provider Triage Evaluation Note  [name] , a 63 y.o. male  was evaluated in triage.  Pt complains of presented for left-sided rib pain and left leg pain and edema after a fall yesterday.  Patient states he hit his head and neck but is not on any blood thinners.  Patient states he he recently from [place]  and had vascular surgery done where he had a stent placed in his leg due to poor circulation.  Patient states he can still feel his foot but that his leg feels swollen doing.  Patient Nuys any chest pain shortness of breath but states his left ribs hurt from the fall.  Patient has any abdominal pain nausea vomiting LOC.  Review of Systems  Positive:  Negative:   Physical Exam  BP (!) 86/58   Pulse 95   Temp 98.6 F (37 C) (Oral)   Resp 16   Ht 6' (1.829 m)   Wt 87 kg   SpO2 95%   BMI 26.01 kg/m  Gen:   Awake, no distress   Resp:  Normal effort  MSK:   Moves extremities without difficulty  Other:  Tenderness to left rib cage without bony abnormalities or deformities noted, no abdominal tenderness peritoneal signs, pupils PERRL bilaterally, equal smile, generalized tenderness throughout the left foot however no bony abnormalities or deformities noted, able to wiggle toes, unable to feel dorsalis pedal pulse, no calf tenderness; right BKA  Medical Decision Making  Medically screening exam initiated at 2:06 PM.  Appropriate orders placed.  [name] was informed that the remainder of the evaluation will be completed by another provider, this initial triage assessment does not replace that evaluation, and the importance of remaining in the ED until their evaluation is complete.  Workup initiated, I was unable to palpate a dorsalis pedal pulse and asked the triage nurse to get a Doppler, given patient's significant medical history do not feel he is safe to go out to the waiting room which was relayed to the triage nurse.   Denese Finn, PA-C 02/17/24 1408

## 2024-02-18 ENCOUNTER — Observation Stay (HOSPITAL_COMMUNITY)

## 2024-02-18 ENCOUNTER — Inpatient Hospital Stay (HOSPITAL_COMMUNITY)

## 2024-02-18 DIAGNOSIS — I110 Hypertensive heart disease with heart failure: Secondary | ICD-10-CM | POA: Diagnosis present

## 2024-02-18 DIAGNOSIS — L89153 Pressure ulcer of sacral region, stage 3: Secondary | ICD-10-CM | POA: Diagnosis present

## 2024-02-18 DIAGNOSIS — I70203 Unspecified atherosclerosis of native arteries of extremities, bilateral legs: Secondary | ICD-10-CM | POA: Diagnosis present

## 2024-02-18 DIAGNOSIS — R569 Unspecified convulsions: Secondary | ICD-10-CM | POA: Diagnosis not present

## 2024-02-18 DIAGNOSIS — I5020 Unspecified systolic (congestive) heart failure: Secondary | ICD-10-CM

## 2024-02-18 DIAGNOSIS — I5022 Chronic systolic (congestive) heart failure: Secondary | ICD-10-CM | POA: Diagnosis not present

## 2024-02-18 DIAGNOSIS — C799 Secondary malignant neoplasm of unspecified site: Secondary | ICD-10-CM | POA: Diagnosis not present

## 2024-02-18 DIAGNOSIS — Z515 Encounter for palliative care: Secondary | ICD-10-CM | POA: Diagnosis not present

## 2024-02-18 DIAGNOSIS — Z95810 Presence of automatic (implantable) cardiac defibrillator: Secondary | ICD-10-CM

## 2024-02-18 DIAGNOSIS — W19XXXA Unspecified fall, initial encounter: Secondary | ICD-10-CM

## 2024-02-18 DIAGNOSIS — F32A Depression, unspecified: Secondary | ICD-10-CM | POA: Diagnosis present

## 2024-02-18 DIAGNOSIS — E222 Syndrome of inappropriate secretion of antidiuretic hormone: Secondary | ICD-10-CM | POA: Diagnosis present

## 2024-02-18 DIAGNOSIS — B888 Other specified infestations: Secondary | ICD-10-CM | POA: Diagnosis present

## 2024-02-18 DIAGNOSIS — I5021 Acute systolic (congestive) heart failure: Secondary | ICD-10-CM

## 2024-02-18 DIAGNOSIS — I2510 Atherosclerotic heart disease of native coronary artery without angina pectoris: Secondary | ICD-10-CM | POA: Diagnosis present

## 2024-02-18 DIAGNOSIS — F17210 Nicotine dependence, cigarettes, uncomplicated: Secondary | ICD-10-CM | POA: Diagnosis not present

## 2024-02-18 DIAGNOSIS — Z923 Personal history of irradiation: Secondary | ICD-10-CM | POA: Diagnosis not present

## 2024-02-18 DIAGNOSIS — R2689 Other abnormalities of gait and mobility: Secondary | ICD-10-CM

## 2024-02-18 DIAGNOSIS — E7800 Pure hypercholesterolemia, unspecified: Secondary | ICD-10-CM | POA: Diagnosis present

## 2024-02-18 DIAGNOSIS — Z89511 Acquired absence of right leg below knee: Secondary | ICD-10-CM | POA: Diagnosis not present

## 2024-02-18 DIAGNOSIS — Z79890 Hormone replacement therapy (postmenopausal): Secondary | ICD-10-CM | POA: Diagnosis not present

## 2024-02-18 DIAGNOSIS — I255 Ischemic cardiomyopathy: Secondary | ICD-10-CM | POA: Diagnosis present

## 2024-02-18 DIAGNOSIS — Z955 Presence of coronary angioplasty implant and graft: Secondary | ICD-10-CM | POA: Diagnosis not present

## 2024-02-18 DIAGNOSIS — Z7189 Other specified counseling: Secondary | ICD-10-CM

## 2024-02-18 DIAGNOSIS — G936 Cerebral edema: Secondary | ICD-10-CM | POA: Diagnosis not present

## 2024-02-18 DIAGNOSIS — C3491 Malignant neoplasm of unspecified part of right bronchus or lung: Secondary | ICD-10-CM | POA: Diagnosis not present

## 2024-02-18 DIAGNOSIS — Z510 Encounter for antineoplastic radiation therapy: Secondary | ICD-10-CM | POA: Diagnosis not present

## 2024-02-18 DIAGNOSIS — C3411 Malignant neoplasm of upper lobe, right bronchus or lung: Secondary | ICD-10-CM | POA: Diagnosis not present

## 2024-02-18 DIAGNOSIS — Z66 Do not resuscitate: Secondary | ICD-10-CM | POA: Diagnosis present

## 2024-02-18 DIAGNOSIS — I501 Left ventricular failure: Secondary | ICD-10-CM | POA: Diagnosis not present

## 2024-02-18 DIAGNOSIS — C3490 Malignant neoplasm of unspecified part of unspecified bronchus or lung: Secondary | ICD-10-CM | POA: Diagnosis not present

## 2024-02-18 DIAGNOSIS — C7931 Secondary malignant neoplasm of brain: Secondary | ICD-10-CM | POA: Diagnosis not present

## 2024-02-18 DIAGNOSIS — E039 Hypothyroidism, unspecified: Secondary | ICD-10-CM | POA: Diagnosis not present

## 2024-02-18 DIAGNOSIS — J4489 Other specified chronic obstructive pulmonary disease: Secondary | ICD-10-CM | POA: Diagnosis present

## 2024-02-18 DIAGNOSIS — Z9221 Personal history of antineoplastic chemotherapy: Secondary | ICD-10-CM | POA: Diagnosis not present

## 2024-02-18 LAB — ECHOCARDIOGRAM COMPLETE
Height: 72 in
Weight: 3068.8 [oz_av]

## 2024-02-18 LAB — COMPREHENSIVE METABOLIC PANEL WITH GFR
ALT: 10 U/L (ref 0–44)
AST: 15 U/L (ref 15–41)
Albumin: 2.8 g/dL — ABNORMAL LOW (ref 3.5–5.0)
Alkaline Phosphatase: 84 U/L (ref 38–126)
Anion gap: 12 (ref 5–15)
BUN: 5 mg/dL — ABNORMAL LOW (ref 8–23)
CO2: 25 mmol/L (ref 22–32)
Calcium: 8.8 mg/dL — ABNORMAL LOW (ref 8.9–10.3)
Chloride: 95 mmol/L — ABNORMAL LOW (ref 98–111)
Creatinine, Ser: 0.71 mg/dL (ref 0.61–1.24)
GFR, Estimated: >60 mL/min (ref >60)
Glucose, Bld: 114 mg/dL — ABNORMAL HIGH (ref 70–99)
Potassium: 4.2 mmol/L (ref 3.5–5.1)
Sodium: 132 mmol/L — ABNORMAL LOW (ref 135–145)
Total Bilirubin: 0.9 mg/dL (ref 0.0–1.2)
Total Protein: 6.1 g/dL — ABNORMAL LOW (ref 6.5–8.1)

## 2024-02-18 LAB — CBC
HCT: 40.1 % (ref 39.0–52.0)
Hemoglobin: 13 g/dL (ref 13.0–17.0)
MCH: 27.1 pg (ref 26.0–34.0)
MCHC: 32.4 g/dL (ref 30.0–36.0)
MCV: 83.7 fL (ref 80.0–100.0)
Platelets: 405 10*3/uL — ABNORMAL HIGH (ref 150–400)
RBC: 4.79 MIL/uL (ref 4.22–5.81)
RDW: 14.7 % (ref 11.5–15.5)
WBC: 9.4 10*3/uL (ref 4.0–10.5)
nRBC: 0 % (ref 0.0–0.2)

## 2024-02-18 LAB — LACTIC ACID, PLASMA: Lactic Acid, Venous: 1.1 mmol/L (ref 0.5–1.9)

## 2024-02-18 LAB — TSH: TSH: 3.242 u[IU]/mL (ref 0.350–4.500)

## 2024-02-18 LAB — HIV ANTIBODY (ROUTINE TESTING W REFLEX): HIV Screen 4th Generation wRfx: NONREACTIVE

## 2024-02-18 LAB — PROTIME-INR
INR: 1.1 (ref 0.8–1.2)
Prothrombin Time: 14 s (ref 11.4–15.2)

## 2024-02-18 MED ORDER — MIDODRINE HCL 5 MG PO TABS
10.0000 mg | ORAL_TABLET | Freq: Three times a day (TID) | ORAL | Status: DC
  Administered 2024-02-19 – 2024-02-27 (×26): 10 mg via ORAL
  Filled 2024-02-18 (×26): qty 2

## 2024-02-18 MED ORDER — LACTATED RINGERS IV BOLUS
500.0000 mL | Freq: Once | INTRAVENOUS | Status: AC
  Administered 2024-02-18: 500 mL via INTRAVENOUS

## 2024-02-18 MED ORDER — ALTEPLASE 2 MG IJ SOLR
2.0000 mg | Freq: Once | INTRAMUSCULAR | Status: AC
  Administered 2024-02-18: 2 mg
  Filled 2024-02-18: qty 2

## 2024-02-18 MED ORDER — MEDIHONEY WOUND/BURN DRESSING EX PSTE
1.0000 | PASTE | Freq: Every day | CUTANEOUS | Status: DC
  Administered 2024-02-18 – 2024-02-27 (×10): 1 via TOPICAL
  Filled 2024-02-18 (×2): qty 44

## 2024-02-18 MED ORDER — MIDODRINE HCL 5 MG PO TABS
10.0000 mg | ORAL_TABLET | Freq: Three times a day (TID) | ORAL | Status: AC
  Administered 2024-02-18 (×2): 10 mg via ORAL
  Filled 2024-02-18: qty 2

## 2024-02-18 MED ORDER — ZINC OXIDE 40 % EX OINT
TOPICAL_OINTMENT | Freq: Two times a day (BID) | CUTANEOUS | Status: DC
  Administered 2024-02-24 – 2024-02-26 (×3): 1 via TOPICAL
  Filled 2024-02-18: qty 57

## 2024-02-18 MED ORDER — LEVETIRACETAM 500 MG PO TABS
500.0000 mg | ORAL_TABLET | Freq: Two times a day (BID) | ORAL | Status: DC
  Administered 2024-02-18 – 2024-02-27 (×19): 500 mg via ORAL
  Filled 2024-02-18 (×19): qty 1

## 2024-02-18 NOTE — Progress Notes (Signed)
 Pt stated he hadn't taken one of the future meds and that he needs it for bed. Clarified with the MD and pharmacy, see new order.    Also made the provider aware of his soft BP, instructed I give him a fluid bolus of 500ml. And recheck. Gave permission to administer medication before scheduled time. See MAR.

## 2024-02-18 NOTE — Evaluation (Signed)
 Physical Therapy Evaluation Patient Details Name: [name] MRN: [id number] DOB: June 03, 1961 Today's Date: 02/18/2024  History of Present Illness  Pt is a 63 y.o. male who presented 02/17/24 with L leg pain s/p fall x1 day PTA. Pt was found to have lesions in the brain on CT head, concerning for metastases. PMH: R BKA, non-small cell lung cancer, hypothyroidism, chronic systolic CHF, CAD with PCI to LAD in 2017, hepatitis C, STEMI, AICD, asthma, COPD, DVT, PAD   Clinical Impression  Pt presents with condition above and deficits mentioned below, see PT Problem List. PTA, he was mod I utilizing his prosthesis and RW for functional mobility. He lives with his wife and sister-in-law in a mobile home with a ramped entrance. His sister-in-law is a retired PCA and is living with them to assist the pt as needed. The pt does not ambulate without his prosthesis donned. Unfortunately, his prosthesis is currently not present, therefore was only able to perform functional transfers today. He needed minA to transfer to stand and hop pivot from bed to chair with RW support. He is at high risk for subsequent falls and endorses falling daily at home. He demonstrates deficits in generalized strength, balance, and activity tolerance. Of note, pt reported a hx of "twitching" episodes with seemingly LOC and states they thought they were "grand mal seizures", which is what his family came to the conclusion of and that he never was diagnosed with them by a MD. Pt is not a great historian though. His BP was noted to be low and drop with HR elevating with positional changes, so question whether these twitching episodes, which tend to happen when he is standing, may be due to pt being orthostatic. Pt reports he cannot wear TED hose due to them hurting. Notified MD of these reports and findings. Will continue to follow acutely. The pt reports he has the support needed at d/c and reports he would rather d/c home with HHPT rather than  go to inpatient rehab.     Vitals -  93/63 (73) with HR in 90s sitting  80/64 (70) with HR 127 bpm standing.    If plan is discharge home, recommend the following: A little help with walking and/or transfers;A little help with bathing/dressing/bathroom;Assistance with cooking/housework;Direct supervision/assist for financial management;Direct supervision/assist for medications management;Assist for transportation;Help with stairs or ramp for entrance;Supervision due to cognitive status   Can travel by private vehicle        Equipment Recommendations BSC/3in1  Recommendations for Other Services       Functional Status Assessment Patient has had a recent decline in their functional status and demonstrates the ability to make significant improvements in function in a reasonable and predictable amount of time.     Precautions / Restrictions Precautions Precautions: Fall;Other (comment) Precaution/Restrictions Comments: has prosthesis (not currently present); watch BP Restrictions Weight Bearing Restrictions Per Provider Order: No      Mobility  Bed Mobility               General bed mobility comments: Pt sitting EOB upon arrival, sitting in chair end of session    Transfers Overall transfer level: Needs assistance Equipment used: Rolling walker (2 wheels) Transfers: Sit to/from Stand, Bed to chair/wheelchair/BSC Sit to Stand: Min assist   Step pivot transfers: Min assist       General transfer comment: Prosthesis not currently present. Pt required minA to power up to stand and maintain his balance and then minA for balance when  step pivoting to L from bed to recliner with RW support. Performed x1 additional sit to stand transfer from recliner with minA    Ambulation/Gait Ambulation/Gait assistance: Min assist Gait Distance (Feet): 1 Feet Assistive device: Rolling walker (2 wheels) Gait Pattern/deviations:  (hop-to) Gait velocity: reduced Gait velocity  interpretation: <1.31 ft/sec, indicative of household ambulator   General Gait Details: Pt hopped to L from bed to chair with RW and minA for balance. Prosthesis not currently present to progress gait as pt does not ambulate without it at baseline  Stairs            Wheelchair Mobility     Tilt Bed    Modified Rankin (Stroke Patients Only)       Balance Overall balance assessment: Needs assistance Sitting-balance support: No upper extremity supported, Feet supported Sitting balance-Leahy Scale: Good Sitting balance - Comments: sits EOB unsupported without LOB   Standing balance support: Bilateral upper extremity supported, During functional activity, Reliant on assistive device for balance Standing balance-Leahy Scale: Poor Standing balance comment: reliant on RW and minA                             Pertinent Vitals/Pain Pain Assessment Pain Assessment: Faces Faces Pain Scale: No hurt Pain Intervention(s): Monitored during session    Home Living Family/patient expects to be discharged to:: Private residence Living Arrangements: Spouse/significant other;Other relatives (& sister-in-law) Available Help at Discharge: Family;Available 24 hours/day Type of Home: Mobile home Home Access: Ramped entrance (needs to be rebuilt)       Home Layout: One level Home Equipment: Agricultural consultant (2 wheels);Rollator (4 wheels);Wheelchair - manual;Shower seat;Hand held shower head      Prior Function Prior Level of Function : Needs assist             Mobility Comments: Uses prosthesis majority of time; uses RW mod I; falls daily ADLs Comments: Family does cooking and cleaning, can bathe and dress himself, sometimes does bird baths, wife manages meds due to memory deficits     Extremity/Trunk Assessment   Upper Extremity Assessment Upper Extremity Assessment: Defer to OT evaluation    Lower Extremity Assessment Lower Extremity Assessment: RLE  deficits/detail;Generalized weakness RLE Deficits / Details: R BKA    Cervical / Trunk Assessment Cervical / Trunk Assessment: Normal  Communication   Communication Communication: No apparent difficulties    Cognition Arousal: Alert Behavior During Therapy: WFL for tasks assessed/performed   PT - Cognitive impairments: History of cognitive impairments                       PT - Cognition Comments: Per wife, pt has a hx of memory deficits which was noted by contradictory information provided by pt vs wife with home set up questions. Pt follows simple cues consistently. Following commands: Impaired Following commands impaired: Only follows one step commands consistently, Follows multi-step commands with increased time     Cueing Cueing Techniques: Verbal cues     General Comments General comments (skin integrity, edema, etc.): pt reported a hx of "twitching" episodes with seemingly LOC and states they thought they were "grand mal seizures", which is what his family came to the conclusion of and that he never was diagnosed with them by a MD. Vitals - 93/63 (73) with HR in 90s sitting then 80/64 (70) with HR 127 bpm standing. Pt states he does not wear TED hose, he stated "I  can't wear those. They hurt"    Exercises     Assessment/Plan    PT Assessment Patient needs continued PT services  PT Problem List Decreased strength;Decreased activity tolerance;Decreased balance;Decreased mobility;Decreased cognition       PT Treatment Interventions DME instruction;Gait training;Functional mobility training;Therapeutic activities;Therapeutic exercise;Balance training;Neuromuscular re-education;Patient/family education;Cognitive remediation    PT Goals (Current goals can be found in the Care Plan section)  Acute Rehab PT Goals Patient Stated Goal: to not fall PT Goal Formulation: With patient/family Time For Goal Achievement: 03/03/24 Potential to Achieve Goals: Good     Frequency Min 2X/week     Co-evaluation               AM-PAC PT "6 Clicks" Mobility  Outcome Measure Help needed turning from your back to your side while in a flat bed without using bedrails?: A Little Help needed moving from lying on your back to sitting on the side of a flat bed without using bedrails?: A Little Help needed moving to and from a bed to a chair (including a wheelchair)?: A Little Help needed standing up from a chair using your arms (e.g., wheelchair or bedside chair)?: A Little Help needed to walk in hospital room?: Total Help needed climbing 3-5 steps with a railing? : Total 6 Click Score: 14    End of Session Equipment Utilized During Treatment: Gait belt Activity Tolerance: Patient tolerated treatment well Patient left: in chair;with call bell/phone within reach;with chair alarm set Nurse Communication: Mobility status PT Visit Diagnosis: Unsteadiness on feet (R26.81);Other abnormalities of gait and mobility (R26.89);Muscle weakness (generalized) (M62.81);History of falling (Z91.81);Repeated falls (R29.6);Difficulty in walking, not elsewhere classified (R26.2)    Time: 1610-9604 PT Time Calculation (min) (ACUTE ONLY): 40 min   Charges:   PT Evaluation $PT Eval Moderate Complexity: 1 Mod PT Treatments $Therapeutic Activity: 23-37 mins PT General Charges $$ ACUTE PT VISIT: 1 Visit         Vernida Goodie, PT, DPT Acute Rehabilitation Services  Office: [phone]   Ellyn Hack 02/18/2024, 6:02 PM

## 2024-02-18 NOTE — Plan of Care (Signed)

## 2024-02-18 NOTE — Progress Notes (Signed)
 EEG complete - results pending

## 2024-02-18 NOTE — Progress Notes (Signed)
 Echocardiogram 2D Echocardiogram has been performed.  [name] 02/18/2024, 1:26 PM

## 2024-02-18 NOTE — Progress Notes (Signed)
 Informed Natalie (Lab), For PT's laboratory orders today, Lab will draw . IV team is working on [employer].

## 2024-02-18 NOTE — Consult Note (Signed)
 WOC team consulted for coccyx pressure injury.  Secure chat to primary nurse asking for photo documentation of wound to be uploaded to Epic.   Please note that the [facility] nursing team is utilizing a standardized work plan to manage patient consults. We are triaging consults and will try to see the patients within 48 hours. Wound photos in the patient's chart allow us  to consult on the patient in the most efficient and timely manner.    Thank you,    Ronni Colace MSN, RN-BC, [employer] [phone]

## 2024-02-18 NOTE — Consult Note (Signed)
 WOC Nurse Consult Note: Reason for Consult: coccyx wound  Wound type: Stage 3 Pressure Injury sacrum  Pressure Injury POA: Yes Measurement: see nursing flowsheet  Wound bed: 50% tan 50% red  Drainage (amount, consistency, odor) see nursing flowsheet  Periwound: erythema and some peeling skin  Dressing procedure/placement/frequency: Cleanse sacral wound with NS, apply Medihoney to wound bed daily, cover with dry gauze and silicone foam.    Will write for Desitin for surrounding intact skin of buttocks.   POC discussed with bedside nurse.  WOC team will not follow. Re-consult if further needs arise.   Thank you,    Ronni Colace MSN, RN-BC, [employer] [phone]

## 2024-02-18 NOTE — Care Management Obs Status (Signed)
 MEDICARE OBSERVATION STATUS NOTIFICATION   Patient Details  Name: [name] MRN: [id number] Date of Birth: 09/29/1960   Medicare Observation Status Notification Given:  Yes    Jannine Meo, RN 02/18/2024, 9:11 AM

## 2024-02-18 NOTE — Consult Note (Signed)
 Consultation Note Date: 02/18/2024   Patient Name: [name]  DOB: April 19, 1961  MRN: [id number]  Age / Sex: 63 y.o., male  PCP: [name], MD Referring Physician: Maylene Spear, MD  Reason for Consultation: Establishing goals of care  HPI/Patient Profile: 63 y.o. male  with past medical history of non-small cell lung cancer, hypothyroidism, chronic systolic CHF with LVEF of 30 to 35% , coronary artery disease with PCI to LAD in 2017, right BKA, admitted on 02/17/2024 with fall at home.   Patient presented to [facility] for persistent LLE pain after fall. Evaluation in the ED raised concern for metastatic disease in his brain. Personally reviewed MRI with edema and associated mass lesions. Patient has symptoms of vision and balance disturbances.    PMT has been consulted to assist with goals of care conversation.  Clinical Assessment and Goals of Care:  I have reviewed medical records including EPIC notes, labs and imaging, assessed the patient and then met at the bedside to discuss diagnosis prognosis, GOC, EOL wishes, disposition and options.  At the end of our conversation, we were joined by patient's wife and sister-in-law and I provided them a brief update.  I introduced Palliative Medicine as specialized medical care for people living with serious illness. It focuses on providing relief from the symptoms and stress of a serious illness. The goal is to improve quality of life for both the patient and the family.  We discussed a brief life review of the patient and then focused on their current illness.  The natural disease trajectory and expectations at EOL were discussed.  I attempted to elicit values and goals of care important to the patient.    Medical History Review and Understanding:  Patient has a good understanding of the severity of his illness.  Reviewed CT chest and abdomen with him in detail, provided update on progressive lung mass and worsening  pulmonary/nodal metastases in addition to his brain metastases.  Social History: Patient has been married for 38 years.  He has 2 children (has raised his wife's 2 children from a very young age and considers them his own).  He is from [place] .  He is very close with his parents, who lives in [place] .  His sister is also coming out to see him from [place] .  He enjoys cars, trucks, Network engineer.  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  Functional and Nutritional State: Patient uses a walker at home. Albumin  of 2.8 noted.  Palliative Symptoms: Numbness and tingling in his left leg  Advance Directives: A detailed discussion regarding advanced directives was had. Reviewed documentation on file from 02/2021. Primary HCPOA is Zeplin Aleshire and secondary HCPOA is Dyan Gladden.   Code Status: Concepts specific to code status, artifical feeding and hydration, and rehospitalization were considered and discussed. Recommended consideration of DNR status, understanding evidenced-based poor outcomes in similar hospitalized patients, as the cause of the arrest is likely associated with chronic/terminal disease rather than a reversible acute cardio-pulmonary event.   Discussion: Patient shares that he is coping well with the news of his progressive cancer and metastasis, as he tries to "go well" and has lived a good life.  Emotional support and therapeutic listening was provided as patient shared his life story and the experiences that have shaped his perspective on life.  He states that he is "right with the George Kinder" that his priority at this time is his grandchildren.  He laments that  he has not created a financial will yet, as he plans on leaving everything to them.  This is one of his main goals. I encouraged him to consider his care preferences moving forward.  He is definitely open to ongoing palliative support in the outpatient setting as he speaks with Dr. Marguerita Shih  about his options.  He states that he hates pills and plans to live life the way he wants to regardless of this diagnosis. I shared that hospice may be most appropriate if he would rather focus on his comfort and let nature take its course.  Provided education on hospice as an insurance benefit and the resources available.  During CODE STATUS conversation, he states he would still want "1 to 2 weeks" on life support.  He attributes this to his friend in [place] , who is on life support and then survived after "pulling the plug."  I counseled that this is likely different situation due to his advanced cancer and he verbalizes understanding.  He will continue to reflect about this.   The difference between aggressive medical intervention and comfort care was considered in light of the patient's goals of care. Hospice and Palliative Care services outpatient were explained and offered.   Discussed the importance of continued conversation with family and the medical providers regarding overall plan of care and treatment options, ensuring decisions are within the context of the patient's values and GOCs.   Questions and concerns were addressed.  Hard Choices booklet left for review. The family was encouraged to call with questions or concerns.  PMT will continue to support holistically.   SUMMARY OF RECOMMENDATIONS   -Continue full code for now. Patient and family will continue discussing -Patient is uncertain if he wishes to pursue cancer treatment -Goal is to spend more time with his grandchildren and make arrangements for his financial will, live his life the way he wants to -Ongoing GOC discussions. Hospice philosophy was introduced in light of patient's preference to avoid medication, medical visits, etc. -Psychosocial and emotional support provided -Spiritual care consult at patient's request -PMT will continue to follow and support  Prognosis:  Poor prognosis given metastatic cancer  Discharge  Planning: To Be Determined      Primary Diagnoses: Present on Admission:  Imbalance  Non-small cell carcinoma of right lung, stage 3 (HCC)  S/P implantation of automatic cardioverter/defibrillator (AICD)  Hypothyroidism  CAD (coronary artery disease)  HFrEF (heart failure with reduced ejection fraction) (HCC)  Lung cancer metastatic to brain [facility])    Physical Exam Vitals and nursing note reviewed.  Constitutional:      General: He is not in acute distress. Cardiovascular:     Rate and Rhythm: Normal rate.  Pulmonary:     Effort: Pulmonary effort is normal.  Neurological:     Mental Status: He is alert and oriented to person, place, and time.  Psychiatric:        Mood and Affect: Mood normal.        Behavior: Behavior normal.        Cognition and Memory: Cognition normal.    Vital Signs: BP 90/61 (BP Location: Right Arm)   Pulse 86   Temp 97.9 F (36.6 C) (Oral)   Resp 17   Ht 6' (1.829 m)   Wt 87 kg   SpO2 98%   BMI 26.01 kg/m  Pain Scale: 0-10   Pain Score: 7    SpO2: SpO2: 98 % O2 Device:SpO2: 98 % O2 Flow Rate: .  Total time: I spent 95 minutes in the care of the patient today in the above activities and documenting the encounter.  MDM: high   Alwyn Cordner Alroy Jericho, PA-C  Palliative Medicine Team Team phone # [phone]  Thank you for allowing the Palliative Medicine Team to assist in the care of this patient. Please utilize secure chat with additional questions, if there is no response within 30 minutes please call the above phone number.  Palliative Medicine Team providers are available by phone from 7am to 7pm daily and can be reached through the team cell phone.  Should this patient require assistance outside of these hours, please call the patient's attending physician.

## 2024-02-18 NOTE — Plan of Care (Signed)

## 2024-02-18 NOTE — Progress Notes (Addendum)
 [facility] PROGRESS NOTE   [name] AOZ:[id number] DOB: Oct 18, 1960 DOA: 02/17/2024  PCP: [name], MD  Brief History: 63 y.o. male with a past medical history of non-small cell lung cancer, hypothyroidism, chronic systolic CHF, coronary artery disease with PCI to LAD in 2017, who was trying to go up the ramp at his house using a walker when he felt his left leg to give out and he as result fell.  He has a history of right below-knee amputation as well.  Patient experienced persistent pain in his left leg as a result of his fall and decided to come into the hospital for evaluation.  Evaluation in the ED raised concern for metastatic disease in his brain.  Cerebral edema was noted.  He was subsequently hospitalized.  He does not remember the last time he was treated for his cancer.  Based on EMR he has not been seen by Dr. Marguerita Shih since 2023.   Consultants: Palliative care.  Will also need to involve medical oncology preferably when Dr. Marguerita Shih is available to see the patient.  Procedures: None    Subjective/Interval History: Patient is somnolent this morning but easily arousable.  Denies any complaints.  No headaches.  He was told about the worsening lung mass noted on the CT scan.    Assessment/Plan:  Imbalance most likely due to brain lesion Patient was found to have in the cerebellum. He fell as a result of his imbalance. Await PT and OT evaluation.  Patient does have right BKA and uses prosthesis at home.  Uses a walker to ambulate.  Non-small cell lung cancer now with metastases CT of the chest abdomen pelvis shows increase in the size of the right lung mass.  Other chronic changes were noted.  No evidence for metastatic process in the abdominal cavity. He does have brain lesions based on CT head which is new.  Vasogenic edema was noted.  Patient was started on dexamethasone .  No clear indication for antiepileptics at this time. MRI brain has been ordered however  it cannot be done until Tuesday due to presence of defibrillator. Patient has previously been seen by Dr. Marguerita Shih.  However has not been seen since 2023.  Attempts were made earlier this year for an appointment but that has not occurred yet. At this time there is no urgent indication to reach out to medical oncology. We will try to have Dr. Marguerita Shih see the patient on Tuesday. In the meantime palliative care has been consulted for goals of care. ADDENDUM: Patient mentioned physical therapist that he had been experiencing "twitching" episodes at home.  Also has experienced loss of consciousness with these episodes.  Could be suggestive of seizures.  Will place him on Keppra  for now.  Will order EEG.    Chronic systolic CHF/ICD in situ Last echocardiogram from 2023 showed LVEF of 30 to 35%.  No evidence for volume overload currently.  Echocardiogram has been ordered.  Hypotension Seems to be chronic since he is on midodrine  prior to admission.  This is being continued.  Coronary artery disease Has a stent to LAD which was placed in 2017.  Unclear if he takes any antiplatelet agents at home.  Hypothyroidism Continue levothyroxine .  Status post right BKA Uses a prosthesis at home.  PT and OT eval.  Stage II pressure injury coccyx Wound care nurse will be consulted.  DVT Prophylaxis: SCDs for now Code Status: Full code Family Communication: Discussed with patient Disposition Plan: To be determined  Status is: Observation The patient will require care spanning > 2 midnights and should be moved to inpatient because: Brain metastases with cerebral edema      Medications: Scheduled:  amitriptyline   100 mg Oral QHS   Chlorhexidine  Gluconate Cloth  6 each Topical Daily   citalopram   40 mg Oral Daily   dexamethasone  (DECADRON ) injection  4 mg Intravenous Q6H   levothyroxine   175 mcg Oral QAC breakfast   midodrine   10 mg Oral TID   nicotine   14 mg Transdermal Daily   sodium chloride   flush  10-40 mL Intracatheter Q12H   Continuous: PRN:acetaminophen  **OR** acetaminophen , alprazolam , HYDROcodone -acetaminophen , ondansetron  **OR** ondansetron  (ZOFRAN ) IV, sodium chloride  flush  Antibiotics: Anti-infectives (From admission, onward)    None       Objective:  Vital Signs  Vitals:   02/17/24 2001 02/17/24 2355 02/18/24 0424 02/18/24 0831  BP: (!) 90/45 (!) 89/57 (!) 91/47 90/61  Pulse: 88 91 92 86  Resp: 16 15 18 17   Temp: 98.2 F (36.8 C) 98.6 F (37 C) 98.4 F (36.9 C) 97.9 F (36.6 C)  TempSrc: Oral Oral Oral Oral  SpO2: 96% 97% 95% 98%  Weight:      Height:        Intake/Output Summary (Last 24 hours) at 02/18/2024 1141 Last data filed at 02/18/2024 0930 Gross per 24 hour  Intake 1011.4 ml  Output 350 ml  Net 661.4 ml   Filed Weights   02/17/24 1350  Weight: 87 kg    General appearance: Somnolent but easily arousable. Resp: Clear to auscultation bilaterally.  Normal effort Cardio: S1-S2 is normal regular.  No S3-S4.  No rubs murmurs or bruit GI: Abdomen is soft.  Nontender nondistended.  Bowel sounds are present normal.  No masses organomegaly Extremities: He is status post a right BKA. Neurologic: No obvious focal neurological deficits noted.  Lab Results:  Data Reviewed: I have personally reviewed following labs and reports of the imaging studies  CBC: Recent Labs  Lab 02/17/24 1553  WBC 13.1*  NEUTROABS 10.8*  HGB 12.2*  HCT 37.7*  MCV 84.9  PLT 416*    Basic Metabolic Panel: Recent Labs  Lab 02/17/24 1553  NA 133*  K 3.6  CL 96*  CO2 27  GLUCOSE 92  BUN <5*  CREATININE 0.65  CALCIUM  8.2*    GFR: Estimated Creatinine Clearance: 106.4 mL/min (by C-G formula based on SCr of 0.65 mg/dL).  Liver Function Tests: Recent Labs  Lab 02/17/24 1553  AST 14*  ALT 8  ALKPHOS 82  BILITOT 0.6  PROT 5.7*  ALBUMIN  2.8*     Recent Results (from the past 240 hours)  Blood culture (routine x 2)     Status: None  (Preliminary result)   Collection Time: 02/17/24  4:18 PM   Specimen: BLOOD  Result Value Ref Range Status   Specimen Description BLOOD LEFT ANTECUBITAL  Final   Special Requests   Final    BOTTLES DRAWN AEROBIC ONLY Blood Culture results may not be optimal due to an inadequate volume of blood received in culture bottles   Culture   Final    NO GROWTH < 24 HOURS Performed at [facility], [address] [place], [place] [address]    Report Status PENDING  Incomplete  Blood culture (routine x 2)     Status: None (Preliminary result)   Collection Time: 02/17/24  4:18 PM   Specimen: BLOOD  Result Value Ref Range Status  Specimen Description BLOOD LEFT ANTECUBITAL  Final   Special Requests   Final    BOTTLES DRAWN AEROBIC ONLY Blood Culture results may not be optimal due to an inadequate volume of blood received in culture bottles   Culture   Final    NO GROWTH < 24 HOURS Performed at [facility], [address] [place], [place] [address]    Report Status PENDING  Incomplete      Radiology Studies: VAS US  LOWER EXTREMITY VENOUS (DVT) (7a-7p) Result Date: 02/18/2024  Lower Venous DVT Study Patient Name:  AMBER GUTHRIDGE  Date of Exam:   02/17/2024 Medical Rec #: [id number]    Accession #:    [id number] Date of Birth: 1961-01-16    Patient Gender: M Patient Age:   3 years Exam Location:  [facility] Procedure:      VAS US  LOWER EXTREMITY VENOUS (DVT) Referring Phys: Aleatha Hunting --------------------------------------------------------------------------------  Indications: Edema.  Comparison Study: No prior studies. Performing Technologist: Lerry Ransom RVT  Examination Guidelines: A complete evaluation includes B-mode imaging, spectral Doppler, color Doppler, and power Doppler as needed of all accessible portions of each vessel. Bilateral testing is considered an integral part of a complete examination. Limited examinations for reoccurring indications may be performed as  noted. The reflux portion of the exam is performed with the patient in reverse Trendelenburg.  +-----+---------------+---------+-----------+----------+--------------+ RIGHTCompressibilityPhasicitySpontaneityPropertiesThrombus Aging +-----+---------------+---------+-----------+----------+--------------+ CFV  Full           Yes      Yes                                 +-----+---------------+---------+-----------+----------+--------------+   +---------+---------------+---------+-----------+----------+--------------+ LEFT     CompressibilityPhasicitySpontaneityPropertiesThrombus Aging +---------+---------------+---------+-----------+----------+--------------+ CFV      Full           Yes      Yes                                 +---------+---------------+---------+-----------+----------+--------------+ SFJ      Full                                                        +---------+---------------+---------+-----------+----------+--------------+ FV Prox  Full                                                        +---------+---------------+---------+-----------+----------+--------------+ FV Mid   Full                                                        +---------+---------------+---------+-----------+----------+--------------+ FV DistalFull                                                        +---------+---------------+---------+-----------+----------+--------------+  PFV      Full                                                        +---------+---------------+---------+-----------+----------+--------------+ POP      Full           Yes      Yes                                 +---------+---------------+---------+-----------+----------+--------------+ PTV      Full                                                        +---------+---------------+---------+-----------+----------+--------------+ PERO     Full                                                         +---------+---------------+---------+-----------+----------+--------------+     Summary: RIGHT: - No evidence of common femoral vein obstruction.   LEFT: - There is no evidence of deep vein thrombosis in the lower extremity.  - No cystic structure found in the popliteal fossa.  *See table(s) above for measurements and observations. Electronically signed by Irvin Mantel on 02/18/2024 at 10:21:28 AM.    Final    CT CHEST ABDOMEN PELVIS W CONTRAST Result Date: 02/17/2024 CLINICAL DATA:  Metastatic disease evaluation EXAM: CT CHEST, ABDOMEN, AND PELVIS WITH CONTRAST TECHNIQUE: Multidetector CT imaging of the chest, abdomen and pelvis was performed following the standard protocol during bolus administration of intravenous contrast. RADIATION DOSE REDUCTION: This exam was performed according to the departmental dose-optimization program which includes automated exposure control, adjustment of the mA and/or kV according to patient size and/or use of iterative reconstruction technique. CONTRAST:  75mL OMNIPAQUE  IOHEXOL  350 MG/ML SOLN COMPARISON:  Chest radiographs 02/17/2024; CTA chest 07/08/2023; CT abdomen pelvis 07/26/2022 FINDINGS: CT CHEST FINDINGS Cardiovascular: No pericardial effusion. Normal heart size. Aortic and coronary artery atherosclerotic calcification. Left chest wall pacemaker. Right chest wall Port-A-Cath tip in the right atrium. Mediastinum/Nodes: 9 mm anterior pericardial node on series 3/image 41. 1.1 cm right paratracheal node on series 3/image 17. 1.4 cm subcarinal node on series 3/image 23. These have increased compared to 07/26/2022 when they were normal in size. Soft tissue thickening along the right mainstem bronchus and right upper and middle lobe bronchi. Unremarkable esophagus. Lungs/Pleura: Chronic post treatment change in the right apex. Increased right upper lobe collapse and consolidation. Chronic gas and fluid collection in the right apex with air-fluid  level. This appears to communicate with the tracheal bronchial tree (series 3/image 19). Masslike consolidation in the right upper lobe has increased compared to 07/08/2023 now measuring 6.3 x 5.2 cm, previously 2.7 x 1.8 cm. Increased size and number of multiple pulmonary nodules bilaterally. For example 2.1 cm nodule in the posteromedial right lower lobe on series 5/image 82 previously measured 0.9 cm. 1.6 cm nodule in the left upper lobe on series 5/image 51  previously measured 0.9 cm. 1.7 cm nodule in the posterior left lower lobe on series 5/image 117 is new. Trace left pleural effusion. Emphysema. Musculoskeletal: No acute fracture or destructive osseous lesion. CT ABDOMEN PELVIS FINDINGS Hepatobiliary: Cholecystectomy. No focal hepatic lesion. No biliary dilation. Pancreas: Unremarkable. Spleen: Unremarkable. Adrenals/Urinary Tract: Normal adrenal glands. No urinary calculi or hydronephrosis. Unremarkable bladder. Stomach/Bowel: Normal caliber large and small bowel. Stomach is within normal limits. No bowel wall thickening. Vascular/Lymphatic: Aortic atherosclerosis. No enlarged abdominal or pelvic lymph nodes. Reproductive: No acute abnormality. Other: No free intraperitoneal fluid or air. Musculoskeletal: No acute fracture or destructive osseous lesion. IMPRESSION: 1. Increased masslike consolidation in the right upper lobe concerning for progression of disease/occurrence. 2. Worsening pulmonary and nodal metastases. 3. Chronic gas and fluid collection in the right apex with air-fluid level and bronchopleural fistula. 4. No evidence of metastatic disease in the abdomen or pelvis. Aortic Atherosclerosis (ICD10-I70.0) and Emphysema (ICD10-J43.9). Electronically Signed   By: Rozell Cornet M.D.   On: 02/17/2024 21:49   CT Head Wo Contrast Result Date: 02/17/2024 CLINICAL DATA:  Trauma, fall. EXAM: CT HEAD WITHOUT CONTRAST CT CERVICAL SPINE WITHOUT CONTRAST TECHNIQUE: Multidetector CT imaging of the head  and cervical spine was performed following the standard protocol without intravenous contrast. Multiplanar CT image reconstructions of the cervical spine were also generated. RADIATION DOSE REDUCTION: This exam was performed according to the departmental dose-optimization program which includes automated exposure control, adjustment of the mA and/or kV according to patient size and/or use of iterative reconstruction technique. COMPARISON:  CT head 03/28/2021. FINDINGS: CT HEAD FINDINGS Brain: No acute intracranial hemorrhage. There is vasogenic edema within the left occipital lobe extending into the posterior left temporal lobe. Suggestion of a heterogeneous 1.2 cm masslike lesion within this region. Additional edema within the left cerebellar hemisphere with an associated 1.4 cm lesion. Edema extends into the cerebellar vermis with associated mass effect and partial effacement of the fourth ventricle. There is also partial effacement of the quadrigeminal cistern more pronounced on the left. Additional foci of vasogenic edema in the left parietal lobe and anterior right frontal lobe. Partial effacement of the left occipital and temporal horns. No hydrocephalus. No midline shift. Vascular: No hyperdense vessel or unexpected calcification. Skull: Normal. Negative for fracture or focal lesion. Sinuses/Orbits: No acute finding. Orbits are symmetric. Mucosal thickening and mucous retention cysts in the maxillary sinuses. Other: None. CT CERVICAL SPINE FINDINGS Alignment: Cervical lordosis is maintained. Trace stepwise retrolisthesis of C4 on C5, C5 on C6, and C6 on C7. No facet subluxation or dislocation. Skull base and vertebrae: No compression fracture or displaced fracture in the cervical spine. Soft tissues and spinal canal: No prevertebral fluid or swelling. No visible canal hematoma. Disc levels: Mild disc space narrowing at multiple levels. There are small disc bulges throughout the cervical spine. Mild spinal  canal stenosis at C4-5. Facet arthrosis at multiple levels most pronounced on the left at C3-4. No high-grade osseous foraminal narrowing. Upper chest: Complete opacification of the partially visualized right lung apex. Right-sided central venous catheter. Emphysema in the left lung apex. Other: None. IMPRESSION: Vasogenic edema in the left occipital lobe and left cerebellum with associated mass lesions. Additional vasogenic edema in the left parietal lobe and anterior right frontal lobe. Findings concerning for metastatic disease. Recommend contrast enhanced MRI brain for further evaluation. Edema in the cerebellum results in partial effacement of the fourth ventricle and quadrigeminal cistern. No hydrocephalus. No acute intracranial hemorrhage. No acute fracture or traumatic malalignment  of the cervical spine. Complete opacification of the right lung apex. Emphysema in the left lung apex. Electronically Signed   By: Denny Flack M.D.   On: 02/17/2024 15:42   CT Cervical Spine Wo Contrast Result Date: 02/17/2024 CLINICAL DATA:  Trauma, fall. EXAM: CT HEAD WITHOUT CONTRAST CT CERVICAL SPINE WITHOUT CONTRAST TECHNIQUE: Multidetector CT imaging of the head and cervical spine was performed following the standard protocol without intravenous contrast. Multiplanar CT image reconstructions of the cervical spine were also generated. RADIATION DOSE REDUCTION: This exam was performed according to the departmental dose-optimization program which includes automated exposure control, adjustment of the mA and/or kV according to patient size and/or use of iterative reconstruction technique. COMPARISON:  CT head 03/28/2021. FINDINGS: CT HEAD FINDINGS Brain: No acute intracranial hemorrhage. There is vasogenic edema within the left occipital lobe extending into the posterior left temporal lobe. Suggestion of a heterogeneous 1.2 cm masslike lesion within this region. Additional edema within the left cerebellar hemisphere with an  associated 1.4 cm lesion. Edema extends into the cerebellar vermis with associated mass effect and partial effacement of the fourth ventricle. There is also partial effacement of the quadrigeminal cistern more pronounced on the left. Additional foci of vasogenic edema in the left parietal lobe and anterior right frontal lobe. Partial effacement of the left occipital and temporal horns. No hydrocephalus. No midline shift. Vascular: No hyperdense vessel or unexpected calcification. Skull: Normal. Negative for fracture or focal lesion. Sinuses/Orbits: No acute finding. Orbits are symmetric. Mucosal thickening and mucous retention cysts in the maxillary sinuses. Other: None. CT CERVICAL SPINE FINDINGS Alignment: Cervical lordosis is maintained. Trace stepwise retrolisthesis of C4 on C5, C5 on C6, and C6 on C7. No facet subluxation or dislocation. Skull base and vertebrae: No compression fracture or displaced fracture in the cervical spine. Soft tissues and spinal canal: No prevertebral fluid or swelling. No visible canal hematoma. Disc levels: Mild disc space narrowing at multiple levels. There are small disc bulges throughout the cervical spine. Mild spinal canal stenosis at C4-5. Facet arthrosis at multiple levels most pronounced on the left at C3-4. No high-grade osseous foraminal narrowing. Upper chest: Complete opacification of the partially visualized right lung apex. Right-sided central venous catheter. Emphysema in the left lung apex. Other: None. IMPRESSION: Vasogenic edema in the left occipital lobe and left cerebellum with associated mass lesions. Additional vasogenic edema in the left parietal lobe and anterior right frontal lobe. Findings concerning for metastatic disease. Recommend contrast enhanced MRI brain for further evaluation. Edema in the cerebellum results in partial effacement of the fourth ventricle and quadrigeminal cistern. No hydrocephalus. No acute intracranial hemorrhage. No acute fracture  or traumatic malalignment of the cervical spine. Complete opacification of the right lung apex. Emphysema in the left lung apex. Electronically Signed   By: Denny Flack M.D.   On: 02/17/2024 15:42   DG Foot Complete Left Result Date: 02/17/2024 CLINICAL DATA:  Pain after fall. EXAM: LEFT FOOT - COMPLETE 3+ VIEW COMPARISON:  07/07/2023 FINDINGS: There is no evidence of fracture or dislocation. Again seen degenerative changes of the first metatarsal phalangeal joint and dorsal midfoot. Soft tissues are unremarkable. IMPRESSION: No fracture or dislocation of the left foot. Electronically Signed   By: Chadwick Colonel M.D.   On: 02/17/2024 15:32   DG Ribs Unilateral W/Chest Left Result Date: 02/17/2024 CLINICAL DATA:  Pain after fall. EXAM: LEFT RIBS AND CHEST - 3+ VIEW COMPARISON:  Chest radiograph 10/09/2021 FINDINGS: The chest view is coned and excludes  the right lateral aspect of the hemithorax. No acute left rib fracture. No evidence of rib lesion or bone destruction. Post treatment related changes at the right lung apex with chronic apical opacity. Right chest port remains in place. Left-sided pacemaker remains in place. The left lung is hyperinflated. No pneumothorax or pleural effusion. Stable heart size and mediastinal contours. IMPRESSION: 1. No acute left rib fracture or pulmonary complication related to fall. 2. Chronic and stable post treatment related changes at the right lung apex. Electronically Signed   By: Chadwick Colonel M.D.   On: 02/17/2024 15:31   DG Tibia/Fibula Left Result Date: 02/17/2024 CLINICAL DATA:  Pain after fall. EXAM: LEFT TIBIA AND FIBULA - 2 VIEW COMPARISON:  None Available. FINDINGS: There is no evidence of fracture or other focal bone lesions. Cortical margins of the tibia and fibula are intact. Knee and ankle alignment are maintained. Small surgical clips in the posteromedial soft tissues. IMPRESSION: Negative radiographs of the left tibia and fibula. Electronically  Signed   By: Chadwick Colonel M.D.   On: 02/17/2024 15:30       LOS: 0 days   Karianne Nogueira Lyndon Santiago  [facility] Pager on [email or web address]  02/18/2024, 11:41 AM

## 2024-02-19 DIAGNOSIS — C799 Secondary malignant neoplasm of unspecified site: Secondary | ICD-10-CM | POA: Diagnosis not present

## 2024-02-19 DIAGNOSIS — R2689 Other abnormalities of gait and mobility: Secondary | ICD-10-CM | POA: Diagnosis not present

## 2024-02-19 DIAGNOSIS — Z7189 Other specified counseling: Secondary | ICD-10-CM

## 2024-02-19 DIAGNOSIS — C3411 Malignant neoplasm of upper lobe, right bronchus or lung: Secondary | ICD-10-CM

## 2024-02-19 DIAGNOSIS — C7931 Secondary malignant neoplasm of brain: Principal | ICD-10-CM

## 2024-02-19 DIAGNOSIS — C3491 Malignant neoplasm of unspecified part of right bronchus or lung: Secondary | ICD-10-CM | POA: Diagnosis not present

## 2024-02-19 DIAGNOSIS — C3490 Malignant neoplasm of unspecified part of unspecified bronchus or lung: Secondary | ICD-10-CM

## 2024-02-19 DIAGNOSIS — I5020 Unspecified systolic (congestive) heart failure: Secondary | ICD-10-CM | POA: Diagnosis not present

## 2024-02-19 DIAGNOSIS — E039 Hypothyroidism, unspecified: Secondary | ICD-10-CM | POA: Diagnosis not present

## 2024-02-19 LAB — GLUCOSE, CAPILLARY: Glucose-Capillary: 207 mg/dL — ABNORMAL HIGH (ref 70–99)

## 2024-02-19 MED ORDER — ENSURE ENLIVE PO LIQD
237.0000 mL | Freq: Two times a day (BID) | ORAL | Status: DC
  Administered 2024-02-19 – 2024-02-27 (×15): 237 mL via ORAL

## 2024-02-19 NOTE — Progress Notes (Signed)
 Daily Progress Note   Patient Name: [name]       Date: 02/19/2024 DOB: [birth year]  Age: 63 y.o. MRN#: [id number] Attending Physician: Maylene Spear, MD Primary Care Physician: [name], MD Admit Date: 02/17/2024  Reason for Consultation/Follow-up: Establishing goals of care  Subjective: Medical records reviewed including progress notes, labs and imaging. Patient assessed at the bedside. He reports doing well. No visitors present during my visit.  Created space and opportunity for patient's thoughts and feelings on his current illness.  Emotional support and therapeutic listening was provided.  He shared many of the same sentiments and reflections that he was also considering yesterday.  Patient continues to desire full code and mentions he disagreed with his wife previously making him DNR.  He strongly holds onto and experience with his friend, who survived after it was thought he would pass away from "pulling the plug."  I again emphasized that his situation is different with his cancer diagnosis.  Outpatient palliative care was explained and offered.  He is agreeable to palliative follow-up at home.  He looks forward to camping, more time with his family, and states he would be happy with 5 more years watching his grandchildren grow. He would like to follow-up with oncology to learn more about his prognosis and options.  He previously had a good experience with immunotherapy.  Questions and concerns addressed. PMT will continue to support holistically.   Length of Stay: 1   Physical Exam Vitals and nursing note reviewed.  Constitutional:      General: He is not in acute distress.    Appearance: He is ill-appearing.  Cardiovascular:     Rate and Rhythm: Normal rate.  Pulmonary:     Effort: Pulmonary effort is normal.   Neurological:     Mental Status: He is alert and oriented to person, place, and time.  Psychiatric:        Mood and Affect: Mood normal.        Behavior: Behavior normal.             Vital Signs: BP 100/69 (BP Location: Right Arm)   Pulse 78   Temp 97.8 F (36.6 C) (Oral)   Resp 16   Ht 6' (1.829 m)   Wt 87 kg   SpO2 96%   BMI 26.01 kg/m  SpO2: SpO2: 96 % O2 Device: O2 Device: Room Air O2 Flow Rate:        Palliative Assessment/Data:   Palliative Care Assessment & Plan   Patient Profile: 63 y.o. male  with past medical history of non-small cell lung cancer,  hypothyroidism, chronic systolic CHF with LVEF of 30 to 35% , coronary artery disease with PCI to LAD in 2017, right BKA, admitted on 02/17/2024 with fall at home.    Patient presented to [facility] for persistent LLE pain after fall. Evaluation in the ED raised concern for metastatic disease in his brain. Personally reviewed MRI with edema and associated mass lesions. Patient has symptoms of vision and balance disturbances.    PMT has been consulted to assist with goals of care conversation.  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  TOC assistance is appreciated Psychosocial and emotional support provided PMT remains available as needed   Prognosis:  Poor prognosis given metastatic cancer   Discharge Planning: Home with Palliative Services  Care plan was discussed with patient   Time Total: 50  Visit consisted of counseling and education dealing with the  complex and emotionally intense issues of symptom management and palliative care in the setting of serious and potentially life-threatening illness. Greater than 50% of this time was spent counseling and coordinating care related to the above assessment and plan.  Personally spent 50 minutes in patient care including extensive chart review (labs, imaging, progress/consult notes, vital signs), medically appropraite exam, discussed with treatment team, education to patient, family, and staff, documenting clinical information, medication review and management, coordination of care, and available advanced directive documents.    Eliyana Pagliaro P Kristofor Michalowski, PA-C  Palliative Medicine Team Team phone # [phone]  Thank you for allowing the Palliative Medicine Team to assist in the care of this patient. Please utilize secure chat with additional questions, if there is no response within 30 minutes please call the above phone number.  Palliative Medicine Team providers are available by phone from 7am to 7pm daily and can be reached through the team cell phone.  Should this patient require assistance outside of these hours, please call the patient's attending physician.

## 2024-02-19 NOTE — Plan of Care (Signed)

## 2024-02-19 NOTE — Progress Notes (Signed)
 [facility] PROGRESS NOTE   [name] WUJ:[id number] DOB: October 21, 1960 DOA: 02/17/2024  PCP: [name], MD  Brief History: 63 y.o. male with a past medical history of non-small cell lung cancer, hypothyroidism, chronic systolic CHF, coronary artery disease with PCI to LAD in 2017, who was trying to go up the ramp at his house using a walker when he felt his left leg to give out and he as result fell.  He has a history of right below-knee amputation as well.  Patient experienced persistent pain in his left leg as a result of his fall and decided to come into the hospital for evaluation.  Evaluation in the ED raised concern for metastatic disease in his brain.  Cerebral edema was noted.  He was subsequently hospitalized.  He does not remember the last time he was treated for his cancer.  Based on EMR he has not been seen by Dr. Marguerita Shih since 2023.   Consultants: Palliative care.  Will also need to involve medical oncology preferably when Dr. Marguerita Shih is available to see the patient.  Procedures: None    Subjective/Interval History: Patient noted to be awake and alert this morning.  Denies any headaches.  He was asked about the twitching episodes that he mentioned to the physical therapist.  He gave me the impression this has been ongoing since he was a child.  Might have gotten worse in the recent months but again history is very poor regarding this matter.     Assessment/Plan:  Imbalance most likely due to brain lesion He fell as a result of his imbalance. Imbalance most likely due to brain metastases. Seen by physical therapy.  Home health is recommended. Patient does have right BKA and uses prosthesis at home.  Uses a walker to ambulate.  Non-small cell lung cancer now with metastases CT of the chest abdomen pelvis shows increase in the size of the right lung mass.  Other chronic changes were noted.  No evidence for metastatic process in the abdominal cavity. He does have  brain lesions based on CT head which is new.  Vasogenic edema was noted.  Patient was started on dexamethasone .  No clear indication for antiepileptics at this time. MRI brain has been ordered however it cannot be done until Tuesday due to presence of defibrillator. Patient has previously been seen by Dr. Marguerita Shih.  However has not been seen since 2023.  Attempts were made earlier this year for an appointment but that has not occurred yet. Patient keen on knowing if there are any treatment options possible going forward.  Have reached out to Dr. Salomon Cree with medical oncology who will review his chart and speak to the patient today. Continue with dexamethasone .  Started on Keppra  due to concern for seizure activity prior to admission.  EEG was done yesterday and report is pending.  No overt seizure activity noted in the hospital.  Chronic systolic CHF/ICD in situ Last echocardiogram from 2023 showed LVEF of 30 to 35%.  No evidence for volume overload currently.  Echo cardiogram was done however poor acoustic windows were noted.  LVEF could not be estimated. Will wait on oncology input first and to see what patient decides to do before deciding to repeat the study.  Hypotension Seems to be chronic since he is on midodrine  prior to admission.  This is being continued.  Blood pressure is stable.  Coronary artery disease Has a stent to LAD which was placed in 2017.  Unclear if he  takes any antiplatelet agents at home.  Hypothyroidism Continue levothyroxine .  Status post right BKA Uses a prosthesis at home.  PT and OT eval.  Stage II pressure injury coccyx Seen by wound care nurse.  DVT Prophylaxis: SCDs for now Code Status: Full code Family Communication: Discussed with patient Disposition Plan: Home with home health when medically cleared.      Medications: Scheduled:  amitriptyline   100 mg Oral QHS   Chlorhexidine  Gluconate Cloth  6 each Topical Daily   citalopram   40 mg Oral Daily    dexamethasone  (DECADRON ) injection  4 mg Intravenous Q6H   leptospermum manuka honey  1 Application Topical Daily   levETIRAcetam   500 mg Oral BID   levothyroxine   175 mcg Oral QAC breakfast   liver oil-zinc oxide   Topical BID   midodrine   10 mg Oral TID WC   nicotine   14 mg Transdermal Daily   sodium chloride  flush  10-40 mL Intracatheter Q12H   Continuous: PRN:acetaminophen  **OR** acetaminophen , alprazolam , HYDROcodone -acetaminophen , ondansetron  **OR** ondansetron  (ZOFRAN ) IV, sodium chloride  flush  Antibiotics: Anti-infectives (From admission, onward)    None       Objective:  Vital Signs  Vitals:   02/18/24 1736 02/18/24 2111 02/19/24 0532 02/19/24 0743  BP: (!) 96/54 99/60 102/70 100/69  Pulse: 82 83 79 78  Resp: 17 17 18 16   Temp: 97.7 F (36.5 C) 97.8 F (36.6 C) 97.7 F (36.5 C) 97.8 F (36.6 C)  TempSrc: Oral Axillary Oral Oral  SpO2: 98% 96% 99% 96%  Weight:      Height:        Intake/Output Summary (Last 24 hours) at 02/19/2024 0959 Last data filed at 02/19/2024 0532 Gross per 24 hour  Intake --  Output 1025 ml  Net -1025 ml   Filed Weights   02/17/24 1350  Weight: 87 kg    General appearance: Awake alert.  In no distress Resp: Coarse breath sounds bilaterally with crackles on the right. Cardio: S1-S2 is normal regular.  No S3-S4.  No rubs murmurs or bruit GI: Abdomen is soft.  Nontender nondistended.  Bowel sounds are present normal.  No masses organomegaly Extremities: Right BKA No obvious focal neurological deficits  Lab Results:  Data Reviewed: I have personally reviewed following labs and reports of the imaging studies  CBC: Recent Labs  Lab 02/17/24 1553 02/18/24 1114  WBC 13.1* 9.4  NEUTROABS 10.8*  --   HGB 12.2* 13.0  HCT 37.7* 40.1  MCV 84.9 83.7  PLT 416* 405*    Basic Metabolic Panel: Recent Labs  Lab 02/17/24 1553 02/18/24 1114  NA 133* 132*  K 3.6 4.2  CL 96* 95*  CO2 27 25  GLUCOSE 92 114*  BUN <5* 5*   CREATININE 0.65 0.71  CALCIUM  8.2* 8.8*    GFR: Estimated Creatinine Clearance: 106.4 mL/min (by C-G formula based on SCr of 0.71 mg/dL).  Liver Function Tests: Recent Labs  Lab 02/17/24 1553 02/18/24 1114  AST 14* 15  ALT 8 10  ALKPHOS 82 84  BILITOT 0.6 0.9  PROT 5.7* 6.1*  ALBUMIN  2.8* 2.8*     Recent Results (from the past 240 hours)  Blood culture (routine x 2)     Status: None (Preliminary result)   Collection Time: 02/17/24  4:18 PM   Specimen: BLOOD  Result Value Ref Range Status   Specimen Description BLOOD LEFT ANTECUBITAL  Final   Special Requests   Final    BOTTLES DRAWN AEROBIC ONLY  Blood Culture results may not be optimal due to an inadequate volume of blood received in culture bottles   Culture   Final    NO GROWTH 2 DAYS Performed at [facility], [address], [place], [place] [address]    Report Status PENDING  Incomplete  Blood culture (routine x 2)     Status: None (Preliminary result)   Collection Time: 02/17/24  4:18 PM   Specimen: BLOOD  Result Value Ref Range Status   Specimen Description BLOOD LEFT ANTECUBITAL  Final   Special Requests   Final    BOTTLES DRAWN AEROBIC ONLY Blood Culture results may not be optimal due to an inadequate volume of blood received in culture bottles   Culture   Final    NO GROWTH 2 DAYS Performed at [facility], [address], [place], [place] [address]    Report Status PENDING  Incomplete      Radiology Studies: ECHOCARDIOGRAM COMPLETE Result Date: 02/18/2024    ECHOCARDIOGRAM REPORT   Patient Name:   BOND GRIESHOP Date of Exam: 02/18/2024 Medical Rec #:  [id number]   Height:       72.0 in Accession #:    [id number]  Weight:       191.8 lb Date of Birth:  [birth year]   BSA:          2.093 m Patient Age:    63 years    BP:           90/61 mmHg Patient Gender: M           HR:           90 bpm. Exam Location:  Inpatient Procedure: 2D Echo, Cardiac Doppler and Color Doppler (Both Spectral and Color             Flow Doppler were utilized during procedure). Indications:    CHF-Acute Systolic I50.21  History:        Patient has prior history of Echocardiogram examinations, most                 recent 10/08/2021. Cardiomyopathy, Acute MI and CAD, COPD,                 Signs/Symptoms:Hypotension; Risk Factors:Current Smoker.  Sonographer:    Terrilee Few RCS Referring Phys: [id number] Maylene Spear IMPRESSIONS  1. Limited study due to poor acoustic windows. LV function cannot be assessed.  2. Right ventricular systolic function is normal. The right ventricular size is normal.  3. The aortic valve is grossly normal. Aortic valve regurgitation is not visualized.  4. The inferior vena cava is normal in size with greater than 50% respiratory variability, suggesting right atrial pressure of 3 mmHg. FINDINGS  Left Ventricle: Right Ventricle: The right ventricular size is normal. Right vetricular wall thickness was not well visualized. Right ventricular systolic function is normal. Left Atrium: Left atrial size was normal in size. Tricuspid Valve: The tricuspid valve is grossly normal. Tricuspid valve regurgitation is trivial. Aortic Valve: The aortic valve is grossly normal. Aortic valve regurgitation is not visualized. Pulmonic Valve: The pulmonic valve was grossly normal. Pulmonic valve regurgitation is trivial. Venous: The inferior vena cava is normal in size with greater than 50% respiratory variability, suggesting right atrial pressure of 3 mmHg.  IVC IVC diam: 1.70 cm Grady Lawman MD Electronically signed by Grady Lawman MD Signature Date/Time: 02/18/2024/1:45:44 PM    Final    VAS US  LOWER EXTREMITY VENOUS (DVT) (7a-7p)  Result Date: 02/18/2024  Lower Venous DVT Study Patient Name:  DEMBA NIGH  Date of Exam:   02/17/2024 Medical Rec #: [id number]    Accession #:    [id number] Date of Birth: 1961-04-24    Patient Gender: M Patient Age:   47 years Exam Location:  [facility] Procedure:      VAS US  LOWER  EXTREMITY VENOUS (DVT) Referring Phys: Aleatha Hunting --------------------------------------------------------------------------------  Indications: Edema.  Comparison Study: No prior studies. Performing Technologist: Lerry Ransom RVT  Examination Guidelines: A complete evaluation includes B-mode imaging, spectral Doppler, color Doppler, and power Doppler as needed of all accessible portions of each vessel. Bilateral testing is considered an integral part of a complete examination. Limited examinations for reoccurring indications may be performed as noted. The reflux portion of the exam is performed with the patient in reverse Trendelenburg.  +-----+---------------+---------+-----------+----------+--------------+ RIGHTCompressibilityPhasicitySpontaneityPropertiesThrombus Aging +-----+---------------+---------+-----------+----------+--------------+ CFV  Full           Yes      Yes                                 +-----+---------------+---------+-----------+----------+--------------+   +---------+---------------+---------+-----------+----------+--------------+ LEFT     CompressibilityPhasicitySpontaneityPropertiesThrombus Aging +---------+---------------+---------+-----------+----------+--------------+ CFV      Full           Yes      Yes                                 +---------+---------------+---------+-----------+----------+--------------+ SFJ      Full                                                        +---------+---------------+---------+-----------+----------+--------------+ FV Prox  Full                                                        +---------+---------------+---------+-----------+----------+--------------+ FV Mid   Full                                                        +---------+---------------+---------+-----------+----------+--------------+ FV DistalFull                                                         +---------+---------------+---------+-----------+----------+--------------+ PFV      Full                                                        +---------+---------------+---------+-----------+----------+--------------+ POP      Full           Yes  Yes                                 +---------+---------------+---------+-----------+----------+--------------+ PTV      Full                                                        +---------+---------------+---------+-----------+----------+--------------+ PERO     Full                                                        +---------+---------------+---------+-----------+----------+--------------+     Summary: RIGHT: - No evidence of common femoral vein obstruction.   LEFT: - There is no evidence of deep vein thrombosis in the lower extremity.  - No cystic structure found in the popliteal fossa.  *See table(s) above for measurements and observations. Electronically signed by Irvin Mantel on 02/18/2024 at 10:21:28 AM.    Final    CT CHEST ABDOMEN PELVIS W CONTRAST Result Date: 02/17/2024 CLINICAL DATA:  Metastatic disease evaluation EXAM: CT CHEST, ABDOMEN, AND PELVIS WITH CONTRAST TECHNIQUE: Multidetector CT imaging of the chest, abdomen and pelvis was performed following the standard protocol during bolus administration of intravenous contrast. RADIATION DOSE REDUCTION: This exam was performed according to the departmental dose-optimization program which includes automated exposure control, adjustment of the mA and/or kV according to patient size and/or use of iterative reconstruction technique. CONTRAST:  75mL OMNIPAQUE  IOHEXOL  350 MG/ML SOLN COMPARISON:  Chest radiographs 02/17/2024; CTA chest 07/08/2023; CT abdomen pelvis 07/26/2022 FINDINGS: CT CHEST FINDINGS Cardiovascular: No pericardial effusion. Normal heart size. Aortic and coronary artery atherosclerotic calcification. Left chest wall pacemaker. Right chest wall Port-A-Cath  tip in the right atrium. Mediastinum/Nodes: 9 mm anterior pericardial node on series 3/image 41. 1.1 cm right paratracheal node on series 3/image 17. 1.4 cm subcarinal node on series 3/image 23. These have increased compared to 07/26/2022 when they were normal in size. Soft tissue thickening along the right mainstem bronchus and right upper and middle lobe bronchi. Unremarkable esophagus. Lungs/Pleura: Chronic post treatment change in the right apex. Increased right upper lobe collapse and consolidation. Chronic gas and fluid collection in the right apex with air-fluid level. This appears to communicate with the tracheal bronchial tree (series 3/image 19). Masslike consolidation in the right upper lobe has increased compared to 07/08/2023 now measuring 6.3 x 5.2 cm, previously 2.7 x 1.8 cm. Increased size and number of multiple pulmonary nodules bilaterally. For example 2.1 cm nodule in the posteromedial right lower lobe on series 5/image 82 previously measured 0.9 cm. 1.6 cm nodule in the left upper lobe on series 5/image 51 previously measured 0.9 cm. 1.7 cm nodule in the posterior left lower lobe on series 5/image 117 is new. Trace left pleural effusion. Emphysema. Musculoskeletal: No acute fracture or destructive osseous lesion. CT ABDOMEN PELVIS FINDINGS Hepatobiliary: Cholecystectomy. No focal hepatic lesion. No biliary dilation. Pancreas: Unremarkable. Spleen: Unremarkable. Adrenals/Urinary Tract: Normal adrenal glands. No urinary calculi or hydronephrosis. Unremarkable bladder. Stomach/Bowel: Normal caliber large and small bowel. Stomach is within normal limits. No bowel wall thickening. Vascular/Lymphatic: Aortic atherosclerosis. No enlarged abdominal or pelvic lymph nodes.  Reproductive: No acute abnormality. Other: No free intraperitoneal fluid or air. Musculoskeletal: No acute fracture or destructive osseous lesion. IMPRESSION: 1. Increased masslike consolidation in the right upper lobe concerning for  progression of disease/occurrence. 2. Worsening pulmonary and nodal metastases. 3. Chronic gas and fluid collection in the right apex with air-fluid level and bronchopleural fistula. 4. No evidence of metastatic disease in the abdomen or pelvis. Aortic Atherosclerosis (ICD10-I70.0) and Emphysema (ICD10-J43.9). Electronically Signed   By: Rozell Cornet M.D.   On: 02/17/2024 21:49   CT Head Wo Contrast Result Date: 02/17/2024 CLINICAL DATA:  Trauma, fall. EXAM: CT HEAD WITHOUT CONTRAST CT CERVICAL SPINE WITHOUT CONTRAST TECHNIQUE: Multidetector CT imaging of the head and cervical spine was performed following the standard protocol without intravenous contrast. Multiplanar CT image reconstructions of the cervical spine were also generated. RADIATION DOSE REDUCTION: This exam was performed according to the departmental dose-optimization program which includes automated exposure control, adjustment of the mA and/or kV according to patient size and/or use of iterative reconstruction technique. COMPARISON:  CT head 03/28/2021. FINDINGS: CT HEAD FINDINGS Brain: No acute intracranial hemorrhage. There is vasogenic edema within the left occipital lobe extending into the posterior left temporal lobe. Suggestion of a heterogeneous 1.2 cm masslike lesion within this region. Additional edema within the left cerebellar hemisphere with an associated 1.4 cm lesion. Edema extends into the cerebellar vermis with associated mass effect and partial effacement of the fourth ventricle. There is also partial effacement of the quadrigeminal cistern more pronounced on the left. Additional foci of vasogenic edema in the left parietal lobe and anterior right frontal lobe. Partial effacement of the left occipital and temporal horns. No hydrocephalus. No midline shift. Vascular: No hyperdense vessel or unexpected calcification. Skull: Normal. Negative for fracture or focal lesion. Sinuses/Orbits: No acute finding. Orbits are symmetric.  Mucosal thickening and mucous retention cysts in the maxillary sinuses. Other: None. CT CERVICAL SPINE FINDINGS Alignment: Cervical lordosis is maintained. Trace stepwise retrolisthesis of C4 on C5, C5 on C6, and C6 on C7. No facet subluxation or dislocation. Skull base and vertebrae: No compression fracture or displaced fracture in the cervical spine. Soft tissues and spinal canal: No prevertebral fluid or swelling. No visible canal hematoma. Disc levels: Mild disc space narrowing at multiple levels. There are small disc bulges throughout the cervical spine. Mild spinal canal stenosis at C4-5. Facet arthrosis at multiple levels most pronounced on the left at C3-4. No high-grade osseous foraminal narrowing. Upper chest: Complete opacification of the partially visualized right lung apex. Right-sided central venous catheter. Emphysema in the left lung apex. Other: None. IMPRESSION: Vasogenic edema in the left occipital lobe and left cerebellum with associated mass lesions. Additional vasogenic edema in the left parietal lobe and anterior right frontal lobe. Findings concerning for metastatic disease. Recommend contrast enhanced MRI brain for further evaluation. Edema in the cerebellum results in partial effacement of the fourth ventricle and quadrigeminal cistern. No hydrocephalus. No acute intracranial hemorrhage. No acute fracture or traumatic malalignment of the cervical spine. Complete opacification of the right lung apex. Emphysema in the left lung apex. Electronically Signed   By: Denny Flack M.D.   On: 02/17/2024 15:42   CT Cervical Spine Wo Contrast Result Date: 02/17/2024 CLINICAL DATA:  Trauma, fall. EXAM: CT HEAD WITHOUT CONTRAST CT CERVICAL SPINE WITHOUT CONTRAST TECHNIQUE: Multidetector CT imaging of the head and cervical spine was performed following the standard protocol without intravenous contrast. Multiplanar CT image reconstructions of the cervical spine were also generated. RADIATION  DOSE  REDUCTION: This exam was performed according to the departmental dose-optimization program which includes automated exposure control, adjustment of the mA and/or kV according to patient size and/or use of iterative reconstruction technique. COMPARISON:  CT head 03/28/2021. FINDINGS: CT HEAD FINDINGS Brain: No acute intracranial hemorrhage. There is vasogenic edema within the left occipital lobe extending into the posterior left temporal lobe. Suggestion of a heterogeneous 1.2 cm masslike lesion within this region. Additional edema within the left cerebellar hemisphere with an associated 1.4 cm lesion. Edema extends into the cerebellar vermis with associated mass effect and partial effacement of the fourth ventricle. There is also partial effacement of the quadrigeminal cistern more pronounced on the left. Additional foci of vasogenic edema in the left parietal lobe and anterior right frontal lobe. Partial effacement of the left occipital and temporal horns. No hydrocephalus. No midline shift. Vascular: No hyperdense vessel or unexpected calcification. Skull: Normal. Negative for fracture or focal lesion. Sinuses/Orbits: No acute finding. Orbits are symmetric. Mucosal thickening and mucous retention cysts in the maxillary sinuses. Other: None. CT CERVICAL SPINE FINDINGS Alignment: Cervical lordosis is maintained. Trace stepwise retrolisthesis of C4 on C5, C5 on C6, and C6 on C7. No facet subluxation or dislocation. Skull base and vertebrae: No compression fracture or displaced fracture in the cervical spine. Soft tissues and spinal canal: No prevertebral fluid or swelling. No visible canal hematoma. Disc levels: Mild disc space narrowing at multiple levels. There are small disc bulges throughout the cervical spine. Mild spinal canal stenosis at C4-5. Facet arthrosis at multiple levels most pronounced on the left at C3-4. No high-grade osseous foraminal narrowing. Upper chest: Complete opacification of the partially  visualized right lung apex. Right-sided central venous catheter. Emphysema in the left lung apex. Other: None. IMPRESSION: Vasogenic edema in the left occipital lobe and left cerebellum with associated mass lesions. Additional vasogenic edema in the left parietal lobe and anterior right frontal lobe. Findings concerning for metastatic disease. Recommend contrast enhanced MRI brain for further evaluation. Edema in the cerebellum results in partial effacement of the fourth ventricle and quadrigeminal cistern. No hydrocephalus. No acute intracranial hemorrhage. No acute fracture or traumatic malalignment of the cervical spine. Complete opacification of the right lung apex. Emphysema in the left lung apex. Electronically Signed   By: Denny Flack M.D.   On: 02/17/2024 15:42   DG Foot Complete Left Result Date: 02/17/2024 CLINICAL DATA:  Pain after fall. EXAM: LEFT FOOT - COMPLETE 3+ VIEW COMPARISON:  07/07/2023 FINDINGS: There is no evidence of fracture or dislocation. Again seen degenerative changes of the first metatarsal phalangeal joint and dorsal midfoot. Soft tissues are unremarkable. IMPRESSION: No fracture or dislocation of the left foot. Electronically Signed   By: Chadwick Colonel M.D.   On: 02/17/2024 15:32   DG Ribs Unilateral W/Chest Left Result Date: 02/17/2024 CLINICAL DATA:  Pain after fall. EXAM: LEFT RIBS AND CHEST - 3+ VIEW COMPARISON:  Chest radiograph 10/09/2021 FINDINGS: The chest view is coned and excludes the right lateral aspect of the hemithorax. No acute left rib fracture. No evidence of rib lesion or bone destruction. Post treatment related changes at the right lung apex with chronic apical opacity. Right chest port remains in place. Left-sided pacemaker remains in place. The left lung is hyperinflated. No pneumothorax or pleural effusion. Stable heart size and mediastinal contours. IMPRESSION: 1. No acute left rib fracture or pulmonary complication related to fall. 2. Chronic and  stable post treatment related changes at the right lung apex.  Electronically Signed   By: Chadwick Colonel M.D.   On: 02/17/2024 15:31   DG Tibia/Fibula Left Result Date: 02/17/2024 CLINICAL DATA:  Pain after fall. EXAM: LEFT TIBIA AND FIBULA - 2 VIEW COMPARISON:  None Available. FINDINGS: There is no evidence of fracture or other focal bone lesions. Cortical margins of the tibia and fibula are intact. Knee and ankle alignment are maintained. Small surgical clips in the posteromedial soft tissues. IMPRESSION: Negative radiographs of the left tibia and fibula. Electronically Signed   By: Chadwick Colonel M.D.   On: 02/17/2024 15:30       LOS: 1 day   [employer]  [facility] Pager on [email or web address]  02/19/2024, 9:59 AM

## 2024-02-19 NOTE — Procedures (Signed)
 Routine EEG Report  [name] is a 63 y.o. male with a history of altered mental status and brain tumor who is undergoing an EEG to evaluate for seizures.  Report: This EEG was acquired with electrodes placed according to the International 10-20 electrode system (including Fp1, Fp2, F3, F4, C3, C4, P3, P4, O1, O2, T3, T4, T5, T6, A1, A2, Fz, Cz, Pz). The following electrodes were missing or displaced: none.  The occipital dominant rhythm was 11 Hz. This activity is reactive to stimulation. Drowsiness was manifested by background fragmentation; deeper stages of sleep were not identified. There was focal slowing over the left temporal region. There were no interictal epileptiform discharges. There were no electrographic seizures identified. Photic stimulation and hyperventilation were not performed.  Impression and clinical correlation: This EEG was obtained while awake and drowsy and is abnormal due to focal slowing over the left posterior region (indicating focal cerebral dysfunction). That location corresponds to the area where CT scan showed the most significant abnormality. No epileptiform abnormalities were seen during this recording.   Greg Leaks, MD [facility] [phone]  If 7pm- 7am, please page neurology on call as listed in [facility].

## 2024-02-19 NOTE — Consult Note (Signed)
 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 male who has been referred to us  by Mark Maylene Spear MD for evaluation and management of likely newly metastatic lung cancer in a patient with previous history of stage IIIa adenosquamous lung cancer.  Patient with history of multiple medical comorbidities including hepatitis C, STEMI, AICD placement, COPD, depression, peripheral arterial disease [status post right BKA] ,previous history of DVT [name] who was diagnosed with Stage IIIA non-small cell (adenosquamous) carcinoma in June 2019 when he presented with a large right upper lobe mass with questionable chest wall invasion as well as right hilar and mediastinal lymphadenopathy.  He was under management with Mark. Liam Costa for medical oncology treatment. He underwent a course of concurrent chemoradiation with weekly carboplatin  and paclitaxel  status post 6 cycles with partial response.  He has no actionable mutation but PD-L1 expression is 99%. The patient completed treatment with consolidation immunotherapy with Imfinzi  status post 26 cycles.  He tolerated this treatment well with no concerning adverse effects. The patient was on observation for several months but developed disease progression and was started on Keytruda  every 3 weeks from February 27, 2020 and completed 12 cycles of this treatment.  Treatment was  discontinued due to disease progression. He started systemic chemotherapy with carboplatin  for AUC of 5 and Alimta  500 Mg/M2 status post 10 cycles. Starting from cycle #6 the patient is on single agent treatment with Alimta  400 Mg/M2 every 3 weeks. He has been tolerating this treatment well with no concerning adverse effect except for occasional nausea and fatigue. His treatment is currently on hold secondary to toxicity and intolerance.  He was on every 29-month follow-up with Mark. Liam Costa and was last seen in clinic with Mark. Marguerita Costa on 07/29/2022.  At that time there was no overt concerns for disease progression other than a couple of small 3 mm nodules which were to be monitored. Patient subsequently did have a CT of the chest with contrast ordered by Mark. Marguerita Costa on 11/05/2022 which showed slight increase in one of the small left upper lobe lesions which now measured 4 mm.  The other pulmonary lesion had resolved.  Patient has since been lost to follow-up and did not follow-up despite multiple calls to set up an appointment.  He did not have CTA of the chest in the emergency room on 07/08/2023 which showed no evidence of PE but he was noted to have bronchial wall thickening on the right with increased consolidation in the right upper and lower lobes.  Also noted to have new pulmonary nodules bilaterally measuring up to 9 mm concerning for possible metastatic disease.  Patient presents to the emergency room after a fall which was thought to be due to imbalance.  CT head without contrast was done on 02/17/2024 which shows vasogenic edema in the left occipital lobe and left cerebellum with associated mass lesions.  Additional vasogenic edema in the left parietal and right frontal lobe.  Is concerning for metastatic disease. Patient had  a CT chest abdomen pelvis with contrast on 02/17/2024 which showed 1. Increased masslike consolidation in the right upper lobe concerning for progression of  disease/occurrence. 2. Worsening pulmonary and nodal metastases. 3. Chronic gas and fluid collection in the right apex with air-fluid level and bronchopleural fistula. 4. No evidence of metastatic disease in the abdomen or pelvis.  Oncology was consulted to see the patient in house for further evaluation and management. MRI of the brain with and without contrast has been ordered however this would likely be done when cardiology is available to address his defibrillator.  Patient has been started on IV dexamethasone  to address the vasogenic edema around his brain mass.  Patient notes issues with balance and multiple falls. Also notes some vision issues. No headaches. Has had issues with chronic cough. No hemoptysis.   MEDICAL HISTORY:  Past Medical History:  Diagnosis Date   Acute hepatitis C virus infection 06/19/2007   Qualifier: Diagnosis of  By: [name], [name]     Acute ST elevation myocardial infarction (STEMI) involving left anterior descending (LAD) coronary artery (HCC) 05/26/2016   Acute ST elevation myocardial infarction (STEMI) of lateral wall (HCC) 05/26/2016   AICD (automatic cardioverter/defibrillator) present 03/02/2018   Anxiety    Asthma    Atherosclerosis of native arteries of the extremities with intermittent claudication 12/16/2011   Chronic hepatitis C without hepatic coma (HCC) 05/03/2014   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/2018   FEV1 57%   Depression    DVT (deep venous thrombosis) (HCC) 2009; 2019   RLE; LLE   Encounter for antineoplastic chemotherapy 03/22/2018   Hepatitis C    "tx'd in 2015"   High cholesterol    Ischemic cardiomyopathy 03/02/2018   Non-small cell carcinoma of right lung, stage 3 (HCC) 03/22/2018   PAD (peripheral artery disease) (HCC) 01/25/2013   Peripheral vascular disease, unspecified 07/20/2012   Port-A-Cath in place 04/24/2018   ST elevation myocardial infarction involving left anterior descending (LAD) coronary  artery (HCC)    STEMI (ST elevation myocardial infarction) (HCC) 05/26/2016   Tobacco Costa 01/28/2016   Tubular adenoma of colon 07/11/2014   Urinary dribbling     SURGICAL HISTORY: Past Surgical History:  Procedure Laterality Date   AORTOGRAM  08/08/2009   for LLE claudication     By Mark. Nolene Baumgarten   BELOW KNEE LEG AMPUTATION Right 04/25/2008   BIOPSY N/A 05/30/2014   Procedure: BIOPSY;  Surgeon: Suzette Espy, MD;  Location: [facility];  Service: Endoscopy;  Laterality: N/A;   BLADDER TUMOR EXCISION  8/812   CARDIAC CATHETERIZATION N/A 05/26/2016   Procedure: Left Heart Cath and Coronary Angiography;  Surgeon: Millicent Ally, MD;  Location: [facility] INVASIVE CV LAB;  Service: Cardiovascular;  Laterality: N/A;   CARDIAC CATHETERIZATION N/A 05/26/2016   Procedure: Coronary Stent Intervention;  Surgeon: Millicent Ally, MD;  Location: [facility] INVASIVE CV LAB;  Service: Cardiovascular;  Laterality: N/A;   COLONOSCOPY WITH PROPOFOL  N/A 05/30/2014   OZD:GUYQIH colonic polyp-likely source of hematochezia-removed as described above   ESOPHAGOGASTRODUODENOSCOPY (EGD) WITH PROPOFOL  N/A 05/30/2014   KVQ:QVZDGL and bulbar erosions s/p gastric biopsy. No evidence of portal gastropathy on today's examination.   FEMORAL-TIBIAL BYPASS GRAFT  2009   Right side using non-reversed GSV   By Mark. Kittie Perking BYPASS GRAFT  11/24/2007   Right femoral to anterior tibial BPG   by Mark. Nolene Baumgarten   FINGER SURGERY Left    "straightened my pinky"   HAND  TENDON SURGERY Left 2013   Left 5th finger   HERNIA REPAIR     "stomach"   ICD IMPLANT N/A 03/02/2018   Procedure: ICD IMPLANT;  Surgeon: Mark Pump, MD;  Location: [facility] INVASIVE CV LAB;  Service: Cardiovascular;  Laterality: N/A;   INCISIONAL HERNIA REPAIR N/A 12/25/2014   Procedure: HERNIA REPAIR INCISIONAL WITH MESH;  Surgeon: Alanda Allegra Md, MD;  Location: [facility];  Service: General;  Laterality: N/A;   INSERTION OF MESH N/A 12/25/2014   Procedure: INSERTION OF MESH;   Surgeon: Alanda Allegra Md, MD;  Location: [facility];  Service: General;  Laterality: N/A;   IR IMAGING GUIDED PORT INSERTION  04/11/2018   LAPAROSCOPIC CHOLECYSTECTOMY     LOWER EXTREMITY ANGIOGRAM Left 12/30/2015   Procedure: Lower Extremity Angiogram;  Surgeon: Richrd Char, MD;  Location: [facility] INVASIVE CV LAB;  Service: Cardiovascular;  Laterality: Left;   PERIPHERAL VASCULAR CATHETERIZATION N/A 12/30/2015   Procedure: Abdominal Aortogram;  Surgeon: Richrd Char, MD;  Location: [facility] INVASIVE CV LAB;  Service: Cardiovascular;  Laterality: N/A;   POLYPECTOMY  05/30/2014   Procedure: POLYPECTOMY;  Surgeon: Suzette Espy, MD;  Location: [facility];  Service: Endoscopy;;   TONSILLECTOMY AND ADENOIDECTOMY  ~ 1970   TYMPANOSTOMY TUBE PLACEMENT Bilateral ~ 1970   VIDEO ASSISTED THORACOSCOPY Left 10/04/2021   Procedure: PERICARDIAL WINDOW VIDEO ASSISTED THORACOSCOPY APPROACH;  Surgeon: Hilarie Lovely, MD;  Location: [facility];  Service: Thoracic;  Laterality: Left;  pericardial window.  full lateral    SOCIAL HISTORY: Social History   Socioeconomic History   Marital status: Married    Spouse name: Not on file   Number of children: Not on file   Years of education: Not on file   Highest education level: Not on file  Occupational History   Not on file  Tobacco Use   Smoking status: Every Day    Types: Cigarettes   Smokeless tobacco: Never  Vaping Use   Vaping status: Never Used  Substance and Sexual Activity   Alcohol  use: Not Currently    Alcohol /week: 18.0 standard drinks of alcohol     Types: 18 Cans of beer per week    Comment:  history of heavy ETOH use; 03/02/2018 "6-12 beers Fri & Sat"   Drug use: Yes    Types: Marijuana    Comment: 03/02/2018 "weekly"   Sexual activity: Not Currently  Other Topics Concern   Not on file  Social History Narrative   Not on file   Social Drivers of Health   Financial Resource Strain: Not on file  Food Insecurity: Patient Declined (02/18/2024)   Hunger  Vital Sign    Worried About Running Out of Food in the Last Year: Patient declined    Ran Out of Food in the Last Year: Patient declined  Transportation Needs: Patient Declined (02/18/2024)   PRAPARE - Administrator, Civil Service (Medical): Patient declined    Lack of Transportation (Non-Medical): Patient declined  Physical Activity: Not on file  Stress: Not on file  Social Connections: Not on file  Intimate Partner Violence: Patient Declined (02/18/2024)   Humiliation, Afraid, Rape, and Kick questionnaire    Fear of Current or Ex-Partner: Patient declined    Emotionally Abused: Patient declined    Physically Abused: Patient declined    Sexually Abused: Patient declined    FAMILY HISTORY: Family History  Problem Relation Age of Onset   Vision loss Mother    Ulcers Father  Colon cancer Neg Hx     ALLERGIES:  is allergic to lyrica [pregabalin] and neurontin [gabapentin].  MEDICATIONS:  Current Facility-Administered Medications  Medication Dose Route Frequency Provider Last Rate Last Admin   acetaminophen  (TYLENOL ) tablet 650 mg  650 mg Oral Q6H PRN Krishnan, Gokul, MD       Or   acetaminophen  (TYLENOL ) suppository 650 mg  650 mg Rectal Q6H PRN Krishnan, Gokul, MD       ALPRAZolam  (XANAX ) tablet 2 mg  2 mg Oral TID PRN Krishnan, Gokul, MD   2 mg at 02/19/24 1127   amitriptyline  (ELAVIL ) tablet 100 mg  100 mg Oral QHS Chavez, Abigail, NP   100 mg at 02/18/24 2056   Chlorhexidine  Gluconate Cloth 2 % PADS 6 each  6 each Topical Daily Krishnan, Gokul, MD   6 each at 02/19/24 1129   citalopram  (CELEXA ) tablet 40 mg  40 mg Oral Daily Krishnan, Gokul, MD   40 mg at 02/19/24 0816   dexamethasone  (DECADRON ) injection 4 mg  4 mg Intravenous Q6H Krishnan, Gokul, MD   4 mg at 02/19/24 1128   HYDROcodone -acetaminophen  (NORCO) 10-325 MG per tablet 1 tablet  1 tablet Oral Q4H PRN Krishnan, Gokul, MD   1 tablet at 02/19/24 1127   leptospermum manuka honey (MEDIHONEY) paste 1  Application  1 Application Topical Daily Krishnan, Gokul, MD   1 Application at 02/19/24 1191   levETIRAcetam  (KEPPRA ) tablet 500 mg  500 mg Oral BID Krishnan, Gokul, MD   500 mg at 02/19/24 4782   levothyroxine  (SYNTHROID ) tablet 175 mcg  175 mcg Oral QAC breakfast Maylene Spear, MD   175 mcg at 02/19/24 0639   liver oil-zinc oxide (DESITIN) 40 % ointment   Topical BID Krishnan, Gokul, MD   Given at 02/19/24 9562   midodrine  (PROAMATINE ) tablet 10 mg  10 mg Oral TID WC Krishnan, Gokul, MD   10 mg at 02/19/24 1128   nicotine  (NICODERM CQ  - dosed in mg/24 hours) patch 14 mg  14 mg Transdermal Daily Krishnan, Gokul, MD   14 mg at 02/19/24 1308   ondansetron  (ZOFRAN ) tablet 4 mg  4 mg Oral Q6H PRN Krishnan, Gokul, MD       Or   ondansetron  (ZOFRAN ) injection 4 mg  4 mg Intravenous Q6H PRN Krishnan, Gokul, MD       sodium chloride  flush (NS) 0.9 % injection 10-40 mL  10-40 mL Intracatheter Q12H Maylene Spear, MD   10 mL at 02/19/24 0818   sodium chloride  flush (NS) 0.9 % injection 10-40 mL  10-40 mL Intracatheter PRN Maylene Spear, MD        REVIEW OF SYSTEMS:    10 Point review of Systems was done is negative except as noted above.  PHYSICAL EXAMINATION: ECOG PERFORMANCE STATUS: 3 - Symptomatic, >50% confined to bed  . Vitals:   02/19/24 0532 02/19/24 0743  BP: 102/70 100/69  Pulse: 79 78  Resp: 18 16  Temp: 97.7 F (36.5 C) 97.8 F (36.6 C)  SpO2: 99% 96%   Filed Weights   02/17/24 1350  Weight: 191 lb 12.8 oz (87 kg)   .Body mass index is 26.01 kg/m.  GENERAL:alert, in no acute distress and comfortable SKIN: no acute rashes, no significant lesions EYES: conjunctiva are pink and non-injected, sclera anicteric OROPHARYNX: MMM, no exudates, no oropharyngeal erythema or ulceration NECK: supple, no JVD LYMPH:  no palpable lymphadenopathy in the cervical, axillary or inguinal regions LUNGS: b/l scattered rhonci HEART: regular  rate & rhythm ABDOMEN:  normoactive bowel  sounds , non tender, not distended. Extremity: rt BKA PSYCH: alert & oriented x 3 with fluent speech NEURO: no focal motor/sensory deficits  LABORATORY DATA:  I have reviewed the data as listed  .    Latest Ref Rng & Units 02/18/2024   11:14 AM 02/17/2024    3:53 PM 07/08/2023   12:05 AM  CBC  WBC 4.0 - 10.5 K/uL 9.4  13.1  7.4   Hemoglobin 13.0 - 17.0 g/dL 40.9  81.1  91.4   Hematocrit 39.0 - 52.0 % 40.1  37.7  35.5   Platelets 150 - 400 K/uL 405  416  185     .    Latest Ref Rng & Units 02/18/2024   11:14 AM 02/17/2024    3:53 PM 07/08/2023   12:05 AM  CMP  Glucose 70 - 99 mg/dL 782  92  956   BUN 8 - 23 mg/dL 5  <5  8   Creatinine 2.13 - 1.24 mg/dL 0.86  5.78  4.69   Sodium 135 - 145 mmol/L 132  133  131   Potassium 3.5 - 5.1 mmol/L 4.2  3.6  3.2   Chloride 98 - 111 mmol/L 95  96  91   CO2 22 - 32 mmol/L 25  27  27    Calcium  8.9 - 10.3 mg/dL 8.8  8.2  8.3   Total Protein 6.5 - 8.1 g/dL 6.1  5.7  5.7   Total Bilirubin 0.0 - 1.2 mg/dL 0.9  0.6  0.5   Alkaline Phos 38 - 126 U/L 84  82  70   AST 15 - 41 U/L 15  14  18    ALT 0 - 44 U/L 10  8  16       RADIOGRAPHIC STUDIES: I have personally reviewed the radiological images as listed and agreed with the findings in the report. ECHOCARDIOGRAM COMPLETE Result Date: 02/18/2024    ECHOCARDIOGRAM REPORT   Patient Name:   [name] Date of Exam: 02/18/2024 Medical Rec #:  [id number]   Height:       72.0 in Accession #:    [id number]  Weight:       191.8 lb Date of Birth:  1961/05/18   BSA:          2.093 m Patient Age:    63 years    BP:           90/61 mmHg Patient Gender: M           HR:           90 bpm. Exam Location:  Inpatient Procedure: 2D Echo, Cardiac Doppler and Color Doppler (Both Spectral and Color            Flow Doppler were utilized during procedure). Indications:    CHF-Acute Systolic I50.21  History:        Patient has prior history of Echocardiogram examinations, most                 recent 10/08/2021.  Cardiomyopathy, Acute MI and CAD, COPD,                 Signs/Symptoms:Hypotension; Risk Factors:Current Smoker.  Sonographer:    Terrilee Few RCS Referring Phys: [id number] Maylene Costa IMPRESSIONS  1. Limited study due to poor acoustic windows. LV function cannot be assessed.  2. Right ventricular systolic function is normal. The right ventricular size is normal.  3. The  aortic valve is grossly normal. Aortic valve regurgitation is not visualized.  4. The inferior vena cava is normal in size with greater than 50% respiratory variability, suggesting right atrial pressure of 3 mmHg. FINDINGS  Left Ventricle: Right Ventricle: The right ventricular size is normal. Right vetricular wall thickness was not well visualized. Right ventricular systolic function is normal. Left Atrium: Left atrial size was normal in size. Tricuspid Valve: The tricuspid valve is grossly normal. Tricuspid valve regurgitation is trivial. Aortic Valve: The aortic valve is grossly normal. Aortic valve regurgitation is not visualized. Pulmonic Valve: The pulmonic valve was grossly normal. Pulmonic valve regurgitation is trivial. Venous: The inferior vena cava is normal in size with greater than 50% respiratory variability, suggesting right atrial pressure of 3 mmHg.  IVC IVC diam: 1.70 cm Grady Lawman MD Electronically signed by Grady Lawman MD Signature Date/Time: 02/18/2024/1:45:44 PM    Final    VAS US  LOWER EXTREMITY VENOUS (DVT) (7a-7p) Result Date: 02/18/2024  Lower Venous DVT Study Patient Name:  [name]  Date of Exam:   02/17/2024 Medical Rec #: [id number]    Accession #:    [id number] Date of Birth: [birth year]    Patient Gender: M Patient Age:   7 years Exam Location:  [facility] Procedure:      VAS US  LOWER EXTREMITY VENOUS (DVT) Referring Phys: Aleatha Hunting --------------------------------------------------------------------------------  Indications: Edema.  Comparison Study: No prior studies. Performing  Technologist: Lerry Ransom RVT  Examination Guidelines: A complete evaluation includes B-mode imaging, spectral Doppler, color Doppler, and power Doppler as needed of all accessible portions of each vessel. Bilateral testing is considered an integral part of a complete examination. Limited examinations for reoccurring indications may be performed as noted. The reflux portion of the exam is performed with the patient in reverse Trendelenburg.  +-----+---------------+---------+-----------+----------+--------------+ RIGHTCompressibilityPhasicitySpontaneityPropertiesThrombus Aging +-----+---------------+---------+-----------+----------+--------------+ CFV  Full           Yes      Yes                                 +-----+---------------+---------+-----------+----------+--------------+   +---------+---------------+---------+-----------+----------+--------------+ LEFT     CompressibilityPhasicitySpontaneityPropertiesThrombus Aging +---------+---------------+---------+-----------+----------+--------------+ CFV      Full           Yes      Yes                                 +---------+---------------+---------+-----------+----------+--------------+ SFJ      Full                                                        +---------+---------------+---------+-----------+----------+--------------+ FV Prox  Full                                                        +---------+---------------+---------+-----------+----------+--------------+ FV Mid   Full                                                        +---------+---------------+---------+-----------+----------+--------------+  FV DistalFull                                                        +---------+---------------+---------+-----------+----------+--------------+ PFV      Full                                                         +---------+---------------+---------+-----------+----------+--------------+ POP      Full           Yes      Yes                                 +---------+---------------+---------+-----------+----------+--------------+ PTV      Full                                                        +---------+---------------+---------+-----------+----------+--------------+ PERO     Full                                                        +---------+---------------+---------+-----------+----------+--------------+     Summary: RIGHT: - No evidence of common femoral vein obstruction.   LEFT: - There is no evidence of deep vein thrombosis in the lower extremity.  - No cystic structure found in the popliteal fossa.  *See table(s) above for measurements and observations. Electronically signed by Irvin Mantel on 02/18/2024 at 10:21:28 AM.    Final    CT CHEST ABDOMEN PELVIS W CONTRAST Result Date: 02/17/2024 CLINICAL DATA:  Metastatic disease evaluation EXAM: CT CHEST, ABDOMEN, AND PELVIS WITH CONTRAST TECHNIQUE: Multidetector CT imaging of the chest, abdomen and pelvis was performed following the standard protocol during bolus administration of intravenous contrast. RADIATION DOSE REDUCTION: This exam was performed according to the departmental dose-optimization program which includes automated exposure control, adjustment of the mA and/or kV according to patient size and/or use of iterative reconstruction technique. CONTRAST:  75mL OMNIPAQUE  IOHEXOL  350 MG/ML SOLN COMPARISON:  Chest radiographs 02/17/2024; CTA chest 07/08/2023; CT abdomen pelvis 07/26/2022 FINDINGS: CT CHEST FINDINGS Cardiovascular: No pericardial effusion. Normal heart size. Aortic and coronary artery atherosclerotic calcification. Left chest wall pacemaker. Right chest wall Port-A-Cath tip in the right atrium. Mediastinum/Nodes: 9 mm anterior pericardial node on series 3/image 41. 1.1 cm right paratracheal node on series 3/image 17.  1.4 cm subcarinal node on series 3/image 23. These have increased compared to 07/26/2022 when they were normal in size. Soft tissue thickening along the right mainstem bronchus and right upper and middle lobe bronchi. Unremarkable esophagus. Lungs/Pleura: Chronic post treatment change in the right apex. Increased right upper lobe collapse and consolidation. Chronic gas and fluid collection in the right apex with air-fluid level. This appears to communicate with the tracheal bronchial tree (series 3/image 19). Masslike consolidation in the right  upper lobe has increased compared to 07/08/2023 now measuring 6.3 x 5.2 cm, previously 2.7 x 1.8 cm. Increased size and number of multiple pulmonary nodules bilaterally. For example 2.1 cm nodule in the posteromedial right lower lobe on series 5/image 82 previously measured 0.9 cm. 1.6 cm nodule in the left upper lobe on series 5/image 51 previously measured 0.9 cm. 1.7 cm nodule in the posterior left lower lobe on series 5/image 117 is new. Trace left pleural effusion. Emphysema. Musculoskeletal: No acute fracture or destructive osseous lesion. CT ABDOMEN PELVIS FINDINGS Hepatobiliary: Cholecystectomy. No focal hepatic lesion. No biliary dilation. Pancreas: Unremarkable. Spleen: Unremarkable. Adrenals/Urinary Tract: Normal adrenal glands. No urinary calculi or hydronephrosis. Unremarkable bladder. Stomach/Bowel: Normal caliber large and small bowel. Stomach is within normal limits. No bowel wall thickening. Vascular/Lymphatic: Aortic atherosclerosis. No enlarged abdominal or pelvic lymph nodes. Reproductive: No acute abnormality. Other: No free intraperitoneal fluid or air. Musculoskeletal: No acute fracture or destructive osseous lesion. IMPRESSION: 1. Increased masslike consolidation in the right upper lobe concerning for progression of disease/occurrence. 2. Worsening pulmonary and nodal metastases. 3. Chronic gas and fluid collection in the right apex with air-fluid  level and bronchopleural fistula. 4. No evidence of metastatic disease in the abdomen or pelvis. Aortic Atherosclerosis (ICD10-I70.0) and Emphysema (ICD10-J43.9). Electronically Signed   By: Rozell Cornet M.D.   On: 02/17/2024 21:49   CT Head Wo Contrast Result Date: 02/17/2024 CLINICAL DATA:  Trauma, fall. EXAM: CT HEAD WITHOUT CONTRAST CT CERVICAL SPINE WITHOUT CONTRAST TECHNIQUE: Multidetector CT imaging of the head and cervical spine was performed following the standard protocol without intravenous contrast. Multiplanar CT image reconstructions of the cervical spine were also generated. RADIATION DOSE REDUCTION: This exam was performed according to the departmental dose-optimization program which includes automated exposure control, adjustment of the mA and/or kV according to patient size and/or use of iterative reconstruction technique. COMPARISON:  CT head 03/28/2021. FINDINGS: CT HEAD FINDINGS Brain: No acute intracranial hemorrhage. There is vasogenic edema within the left occipital lobe extending into the posterior left temporal lobe. Suggestion of a heterogeneous 1.2 cm masslike lesion within this region. Additional edema within the left cerebellar hemisphere with an associated 1.4 cm lesion. Edema extends into the cerebellar vermis with associated mass effect and partial effacement of the fourth ventricle. There is also partial effacement of the quadrigeminal cistern more pronounced on the left. Additional foci of vasogenic edema in the left parietal lobe and anterior right frontal lobe. Partial effacement of the left occipital and temporal horns. No hydrocephalus. No midline shift. Vascular: No hyperdense vessel or unexpected calcification. Skull: Normal. Negative for fracture or focal lesion. Sinuses/Orbits: No acute finding. Orbits are symmetric. Mucosal thickening and mucous retention cysts in the maxillary sinuses. Other: None. CT CERVICAL SPINE FINDINGS Alignment: Cervical lordosis is  maintained. Trace stepwise retrolisthesis of C4 on C5, C5 on C6, and C6 on C7. No facet subluxation or dislocation. Skull base and vertebrae: No compression fracture or displaced fracture in the cervical spine. Soft tissues and spinal canal: No prevertebral fluid or swelling. No visible canal hematoma. Disc levels: Mild disc space narrowing at multiple levels. There are small disc bulges throughout the cervical spine. Mild spinal canal stenosis at C4-5. Facet arthrosis at multiple levels most pronounced on the left at C3-4. No high-grade osseous foraminal narrowing. Upper chest: Complete opacification of the partially visualized right lung apex. Right-sided central venous catheter. Emphysema in the left lung apex. Other: None. IMPRESSION: Vasogenic edema in the left occipital lobe and left  cerebellum with associated mass lesions. Additional vasogenic edema in the left parietal lobe and anterior right frontal lobe. Findings concerning for metastatic disease. Recommend contrast enhanced MRI brain for further evaluation. Edema in the cerebellum results in partial effacement of the fourth ventricle and quadrigeminal cistern. No hydrocephalus. No acute intracranial hemorrhage. No acute fracture or traumatic malalignment of the cervical spine. Complete opacification of the right lung apex. Emphysema in the left lung apex. Electronically Signed   By: Denny Flack M.D.   On: 02/17/2024 15:42   CT Cervical Spine Wo Contrast Result Date: 02/17/2024 CLINICAL DATA:  Trauma, fall. EXAM: CT HEAD WITHOUT CONTRAST CT CERVICAL SPINE WITHOUT CONTRAST TECHNIQUE: Multidetector CT imaging of the head and cervical spine was performed following the standard protocol without intravenous contrast. Multiplanar CT image reconstructions of the cervical spine were also generated. RADIATION DOSE REDUCTION: This exam was performed according to the departmental dose-optimization program which includes automated exposure control, adjustment of  the mA and/or kV according to patient size and/or use of iterative reconstruction technique. COMPARISON:  CT head 03/28/2021. FINDINGS: CT HEAD FINDINGS Brain: No acute intracranial hemorrhage. There is vasogenic edema within the left occipital lobe extending into the posterior left temporal lobe. Suggestion of a heterogeneous 1.2 cm masslike lesion within this region. Additional edema within the left cerebellar hemisphere with an associated 1.4 cm lesion. Edema extends into the cerebellar vermis with associated mass effect and partial effacement of the fourth ventricle. There is also partial effacement of the quadrigeminal cistern more pronounced on the left. Additional foci of vasogenic edema in the left parietal lobe and anterior right frontal lobe. Partial effacement of the left occipital and temporal horns. No hydrocephalus. No midline shift. Vascular: No hyperdense vessel or unexpected calcification. Skull: Normal. Negative for fracture or focal lesion. Sinuses/Orbits: No acute finding. Orbits are symmetric. Mucosal thickening and mucous retention cysts in the maxillary sinuses. Other: None. CT CERVICAL SPINE FINDINGS Alignment: Cervical lordosis is maintained. Trace stepwise retrolisthesis of C4 on C5, C5 on C6, and C6 on C7. No facet subluxation or dislocation. Skull base and vertebrae: No compression fracture or displaced fracture in the cervical spine. Soft tissues and spinal canal: No prevertebral fluid or swelling. No visible canal hematoma. Disc levels: Mild disc space narrowing at multiple levels. There are small disc bulges throughout the cervical spine. Mild spinal canal stenosis at C4-5. Facet arthrosis at multiple levels most pronounced on the left at C3-4. No high-grade osseous foraminal narrowing. Upper chest: Complete opacification of the partially visualized right lung apex. Right-sided central venous catheter. Emphysema in the left lung apex. Other: None. IMPRESSION: Vasogenic edema in the  left occipital lobe and left cerebellum with associated mass lesions. Additional vasogenic edema in the left parietal lobe and anterior right frontal lobe. Findings concerning for metastatic disease. Recommend contrast enhanced MRI brain for further evaluation. Edema in the cerebellum results in partial effacement of the fourth ventricle and quadrigeminal cistern. No hydrocephalus. No acute intracranial hemorrhage. No acute fracture or traumatic malalignment of the cervical spine. Complete opacification of the right lung apex. Emphysema in the left lung apex. Electronically Signed   By: Denny Flack M.D.   On: 02/17/2024 15:42   DG Foot Complete Left Result Date: 02/17/2024 CLINICAL DATA:  Pain after fall. EXAM: LEFT FOOT - COMPLETE 3+ VIEW COMPARISON:  07/07/2023 FINDINGS: There is no evidence of fracture or dislocation. Again seen degenerative changes of the first metatarsal phalangeal joint and dorsal midfoot. Soft tissues are unremarkable. IMPRESSION: No  fracture or dislocation of the left foot. Electronically Signed   By: Chadwick Colonel M.D.   On: 02/17/2024 15:32   DG Ribs Unilateral W/Chest Left Result Date: 02/17/2024 CLINICAL DATA:  Pain after fall. EXAM: LEFT RIBS AND CHEST - 3+ VIEW COMPARISON:  Chest radiograph 10/09/2021 FINDINGS: The chest view is coned and excludes the right lateral aspect of the hemithorax. No acute left rib fracture. No evidence of rib lesion or bone destruction. Post treatment related changes at the right lung apex with chronic apical opacity. Right chest port remains in place. Left-sided pacemaker remains in place. The left lung is hyperinflated. No pneumothorax or pleural effusion. Stable heart size and mediastinal contours. IMPRESSION: 1. No acute left rib fracture or pulmonary complication related to fall. 2. Chronic and stable post treatment related changes at the right lung apex. Electronically Signed   By: Chadwick Colonel M.D.   On: 02/17/2024 15:31   DG  Tibia/Fibula Left Result Date: 02/17/2024 CLINICAL DATA:  Pain after fall. EXAM: LEFT TIBIA AND FIBULA - 2 VIEW COMPARISON:  None Available. FINDINGS: There is no evidence of fracture or other focal bone lesions. Cortical margins of the tibia and fibula are intact. Knee and ankle alignment are maintained. Small surgical clips in the posteromedial soft tissues. IMPRESSION: Negative radiographs of the left tibia and fibula. Electronically Signed   By: Chadwick Colonel M.D.   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 He was being managed by Mark. Liam Costa and was lost to follow-up since November 2023. PRIOR THERAPY:  1) Concurrent chemoradiation with chemotherapy consisting of weekly carboplatin  for an AUC of 2 and paclitaxel  45 mg/m2.  First dose given on 04/03/2018.  Status post 6 cycles.  Last dose was giving 05/15/2018 with partial response. 2) Consolidation treatment with immunotherapy with Imfinzi  (Durvalumab ) 10 mg/KG every 2 weeks.  First dose 06/20/2018.  Status post 26 cycles. 3) Systemic treatment with immunotherapy with Keytruda  200 mg IV every 3 weeks.  First dose February 27, 2020. Status post 12 cycles. Discontinued due to disease progression. 4) systemic therapy with carboplatin  plus Alimta  x 10 cycles from 03/03/2021 through 10/12/2021 5) was on observation every 3 months with Mark. Liam Costa through November 2023 after which she was lost to follow-up.  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 #3 brain metastases-multiple including Vasogenic edema in the left occipital lobe and left cerebellum with associated mass lesions. Additional vasogenic edema in the left parietal lobe and anterior right frontal lobe. PLAN Discussed available lab  results with the patient Discussed all the available imaging studies including CT chest abdomen pelvis and CT of the head without contrast with the patient. We discussed that the findings are concerning for progression of his non-small cell lung cancer which now appears metastatic to different parts of the lung as well as to the brain. We discussed that his condition is not curable and all treatments be only palliative. We discussed that since there are multiple scattered brain metastases he would likely not be a candidate for surgical excision of the mets that we typically would consult radiation oncology for consideration of SRS to the brain lesions versus WBRT. Agree with dexamethasone  4 mg every 6 hours to reduce vasogenic edema. This will  be followed by consideration of palliative radiation to some of the dominant lung lesions and consideration of further further systemic therapies. Patient has already had chemoradiation to the primary lung lesion and has had CarboTaxol as a radiation sensitizer PD-1 inhibition as consolidation and 1 line of therapy and carboplatin  Alimta  as next line of treatment.  Systemic therapy options going forward to involve other forms of chemotherapy since he does not have a known targetable mutation. Discussed extensively goals of care. -Patient is willing to consider radiation RT for brain metastases SRS vs WBRT based on radiation oncology recommendations. -would recommend consulting radiation oncology tomorrow. -patient is not keen to consider systemic treatment or chemotherapy and would prefer to proceed to Home hospice after completion of palliative RT. -Patients wife was present in person and so were 2 additional family members. Mother was present on the phone.  -he was definitively instructed not to drive or ride a motorcycle due to his neurodeficits and risk of seizures. -family is aware of this recommendations and will supervise patient so he follows this advise  ...which is important since he is insistent on driving. -Mark [name] will be informed regarding patients admission.  .The total time spent in the appointment was 80 minutes* .  All of the patient's questions were answered with apparent satisfaction. The patient knows to call the clinic with any problems, questions or concerns.   Jacquelyn Matt MD MS AAHIVMS [facility] Hematology/Oncology Physician [facility]  .*Total Encounter Time as defined by the Centers for Medicare and Medicaid Services includes, in addition to the face-to-face time of a patient visit (documented in the note above) non-face-to-face time: obtaining and reviewing outside history, ordering and reviewing medications, tests or procedures, care coordination (communications with other health care professionals or caregivers) and documentation in the medical record.

## 2024-02-20 ENCOUNTER — Inpatient Hospital Stay (HOSPITAL_COMMUNITY)

## 2024-02-20 DIAGNOSIS — I501 Left ventricular failure: Secondary | ICD-10-CM

## 2024-02-20 DIAGNOSIS — C3490 Malignant neoplasm of unspecified part of unspecified bronchus or lung: Secondary | ICD-10-CM | POA: Diagnosis not present

## 2024-02-20 DIAGNOSIS — I5020 Unspecified systolic (congestive) heart failure: Secondary | ICD-10-CM | POA: Diagnosis not present

## 2024-02-20 DIAGNOSIS — E039 Hypothyroidism, unspecified: Secondary | ICD-10-CM | POA: Diagnosis not present

## 2024-02-20 DIAGNOSIS — I5022 Chronic systolic (congestive) heart failure: Secondary | ICD-10-CM

## 2024-02-20 DIAGNOSIS — R2689 Other abnormalities of gait and mobility: Secondary | ICD-10-CM | POA: Diagnosis not present

## 2024-02-20 LAB — ECHOCARDIOGRAM LIMITED
Calc EF: 31.5 %
Height: 72 in
S' Lateral: 4.8 cm
Single Plane A2C EF: 25.6 %
Single Plane A4C EF: 30.9 %
Weight: 3068.8 [oz_av]

## 2024-02-20 LAB — BASIC METABOLIC PANEL WITH GFR
Anion gap: 8 (ref 5–15)
BUN: 15 mg/dL (ref 8–23)
CO2: 27 mmol/L (ref 22–32)
Calcium: 8.4 mg/dL — ABNORMAL LOW (ref 8.9–10.3)
Chloride: 97 mmol/L — ABNORMAL LOW (ref 98–111)
Creatinine, Ser: 0.68 mg/dL (ref 0.61–1.24)
GFR, Estimated: >60 mL/min (ref >60)
Glucose, Bld: 141 mg/dL — ABNORMAL HIGH (ref 70–99)
Potassium: 4 mmol/L (ref 3.5–5.1)
Sodium: 132 mmol/L — ABNORMAL LOW (ref 135–145)

## 2024-02-20 LAB — MAGNESIUM: Magnesium: 1.7 mg/dL (ref 1.7–2.4)

## 2024-02-20 MED ORDER — POLYETHYLENE GLYCOL 3350 17 G PO PACK
17.0000 g | PACK | Freq: Every day | ORAL | Status: DC
  Administered 2024-02-22 – 2024-02-27 (×4): 17 g via ORAL
  Filled 2024-02-20 (×6): qty 1

## 2024-02-20 MED ORDER — SENNOSIDES-DOCUSATE SODIUM 8.6-50 MG PO TABS
2.0000 | ORAL_TABLET | Freq: Two times a day (BID) | ORAL | Status: DC
  Administered 2024-02-20 – 2024-02-27 (×13): 2 via ORAL
  Filled 2024-02-20 (×13): qty 2

## 2024-02-20 NOTE — Plan of Care (Signed)
   Problem: Coping: Goal: Level of anxiety will decrease Outcome: Progressing   Problem: Pain Managment: Goal: General experience of comfort will improve and/or be controlled Outcome: Progressing   Problem: Safety: Goal: Ability to remain free from injury will improve Outcome: Progressing

## 2024-02-20 NOTE — Progress Notes (Addendum)
 [facility] PROGRESS NOTE   [name]:[id number] DOB: 1960-12-15 DOA: 02/17/2024  PCP: [name], MD  Brief History: 63 y.o. male with a past medical history of non-small cell lung cancer, hypothyroidism, chronic systolic CHF, coronary artery disease with PCI to LAD in 2017, who was trying to go up the ramp at his house using a walker when he felt his left leg to give out and he as result fell.  He has a history of right below-knee amputation as well.  Patient experienced persistent pain in his left leg as a result of his fall and decided to come into the hospital for evaluation.  Evaluation in the ED raised concern for metastatic disease in his brain.  Cerebral edema was noted.  He was subsequently hospitalized.  He does not remember the last time he was treated for his cancer.  Based on EMR he has not been seen by Dr. Marguerita Shih since 2023.   Consultants: Palliative care.  Medical oncology  Procedures: None    Subjective/Interval History: Patient is awake alert.  Denies any complaints.  States that he had a good conversation with the oncologist yesterday.  Would like to proceed with radiation treatments if that is an option.     Assessment/Plan:   Non-small cell lung cancer now with metastases CT of the chest abdomen pelvis shows increase in the size of the right lung mass.  Other chronic changes were noted.  No evidence for metastatic process in the abdominal cavity. He does have brain lesions based on CT head which is new.  Vasogenic edema was noted.  Patient was started on dexamethasone .  No clear indication for antiepileptics at this time. MRI brain has been ordered however it cannot be done until Tuesday due to presence of defibrillator. Patient has previously been seen by Dr. Marguerita Shih.  However has not been seen since 2023.  Attempts were made earlier this year for an appointment but that has not occurred yet. Patient keen on knowing if there are any treatment  options possible going forward.  Seen by Dr. Salomon Cree yesterday.  It appears that patient is interested in pursuing radiation if that is an option.  This will be for both his lung lesion as well as brain lesion.  Discussed with Dr. Jeryl Moris with radiation oncology who will speak to the patient either today or tomorrow.   In the meantime continue with dexamethasone .   Started on Keppra  due to concern for seizure activity prior to admission.  EEG did not show any epileptiform activity.    Chronic systolic CHF/ICD in situ Last echocardiogram from 2023 showed LVEF of 30 to 35%.  No evidence for volume overload currently.  Echo cardiogram was done however poor acoustic windows were noted.  LVEF could not be estimated. Will order limited echo to see better acoustic windows can be obtained.  Hypotension Seems to be chronic since he is on midodrine  prior to admission.  This is being continued.  Blood pressure is stable.  He remains asymptomatic.  Coronary artery disease Has a stent to LAD which was placed in 2017.  Unclear if he takes any antiplatelet agents at home.  None listed on his home medication list.  Imbalance most likely due to brain lesion He fell as a result of his imbalance. Imbalance most likely due to brain metastases. Seen by physical therapy.  Home health is recommended. Patient does have right BKA and uses prosthesis at home.  Uses a walker to ambulate.  Hypothyroidism Continue levothyroxine .  Status post right BKA Uses a prosthesis at home.  PT and OT eval.  Stage II pressure injury coccyx Seen by wound care nurse.  DVT Prophylaxis: SCDs for now Code Status: Full code Family Communication: Discussed with patient Disposition Plan: Home with home health when medically cleared.      Medications: Scheduled:  amitriptyline   100 mg Oral QHS   Chlorhexidine  Gluconate Cloth  6 each Topical Daily   citalopram   40 mg Oral Daily   dexamethasone  (DECADRON ) injection  4 mg  Intravenous Q6H   feeding supplement  237 mL Oral BID BM   leptospermum manuka honey  1 Application Topical Daily   levETIRAcetam   500 mg Oral BID   levothyroxine   175 mcg Oral QAC breakfast   liver oil-zinc oxide   Topical BID   midodrine   10 mg Oral TID WC   nicotine   14 mg Transdermal Daily   polyethylene glycol  17 g Oral Daily   senna-docusate  2 tablet Oral BID   sodium chloride  flush  10-40 mL Intracatheter Q12H   Continuous: PRN:acetaminophen  **OR** acetaminophen , alprazolam , HYDROcodone -acetaminophen , ondansetron  **OR** ondansetron  (ZOFRAN ) IV, sodium chloride  flush    Objective:  Vital Signs  Vitals:   02/19/24 1545 02/19/24 2019 02/19/24 2019 02/20/24 0458  BP: 104/74 (!) 95/49 (!) 95/49 97/68  Pulse: 78 98 78 71  Resp: 16 17 17 17   Temp: 97.8 F (36.6 C) 98 F (36.7 C) 98 F (36.7 C) 97.7 F (36.5 C)  TempSrc: Oral Oral Oral Oral  SpO2: 100% 98% 98% 97%  Weight:      Height:        Intake/Output Summary (Last 24 hours) at 02/20/2024 0913 Last data filed at 02/20/2024 0247 Gross per 24 hour  Intake --  Output 400 ml  Net -400 ml   Filed Weights   02/17/24 1350  Weight: 87 kg    General appearance: Awake alert.  In no distress Resp: Clear to auscultation bilaterally.  Normal effort Cardio: S1-S2 is normal regular.  No S3-S4.  No rubs murmurs or bruit GI: Abdomen is soft.  Nontender nondistended.  Bowel sounds are present normal.  No masses organomegaly Right BKA is noted  Lab Results:  Data Reviewed: I have personally reviewed following labs and reports of the imaging studies  CBC: Recent Labs  Lab 02/17/24 1553 02/18/24 1114  WBC 13.1* 9.4  NEUTROABS 10.8*  --   HGB 12.2* 13.0  HCT 37.7* 40.1  MCV 84.9 83.7  PLT 416* 405*    Basic Metabolic Panel: Recent Labs  Lab 02/17/24 1553 02/18/24 1114 02/20/24 0500  NA 133* 132* 132*  K 3.6 4.2 4.0  CL 96* 95* 97*  CO2 27 25 27   GLUCOSE 92 114* 141*  BUN <5* 5* 15  CREATININE 0.65  0.71 0.68  CALCIUM  8.2* 8.8* 8.4*  MG  --   --  1.7    GFR: Estimated Creatinine Clearance: 106.4 mL/min (by C-G formula based on SCr of 0.68 mg/dL).  Liver Function Tests: Recent Labs  Lab 02/17/24 1553 02/18/24 1114  AST 14* 15  ALT 8 10  ALKPHOS 82 84  BILITOT 0.6 0.9  PROT 5.7* 6.1*  ALBUMIN  2.8* 2.8*     Recent Results (from the past 240 hours)  Blood culture (routine x 2)     Status: None (Preliminary result)   Collection Time: 02/17/24  4:18 PM   Specimen: BLOOD  Result Value Ref Range Status  Specimen Description BLOOD LEFT ANTECUBITAL  Final   Special Requests   Final    BOTTLES DRAWN AEROBIC ONLY Blood Culture results may not be optimal due to an inadequate volume of blood received in culture bottles   Culture   Final    NO GROWTH 3 DAYS Performed at [facility], [address], [place], [place] [address]    Report Status PENDING  Incomplete  Blood culture (routine x 2)     Status: None (Preliminary result)   Collection Time: 02/17/24  4:18 PM   Specimen: BLOOD  Result Value Ref Range Status   Specimen Description BLOOD LEFT ANTECUBITAL  Final   Special Requests   Final    BOTTLES DRAWN AEROBIC ONLY Blood Culture results may not be optimal due to an inadequate volume of blood received in culture bottles   Culture   Final    NO GROWTH 3 DAYS Performed at [facility], [address], [place], [place] [address]    Report Status PENDING  Incomplete      Radiology Studies: ECHOCARDIOGRAM COMPLETE Result Date: 02/18/2024    ECHOCARDIOGRAM REPORT   Patient Name:   [name] Date of Exam: 02/18/2024 Medical Rec #:  [id number]   Height:       72.0 in Accession #:    [id number]  Weight:       191.8 lb Date of Birth:  1961/06/06   BSA:          2.093 m Patient Age:    63 years    BP:           90/61 mmHg Patient Gender: M           HR:           90 bpm. Exam Location:  Inpatient Procedure: 2D Echo, Cardiac Doppler and Color Doppler (Both Spectral and  Color            Flow Doppler were utilized during procedure). Indications:    CHF-Acute Systolic I50.21  History:        Patient has prior history of Echocardiogram examinations, most                 recent 10/08/2021. Cardiomyopathy, Acute MI and CAD, COPD,                 Signs/Symptoms:Hypotension; Risk Factors:Current Smoker.  Sonographer:    Terrilee Few RCS Referring Phys: [id number] Maylene Spear IMPRESSIONS  1. Limited study due to poor acoustic windows. LV function cannot be assessed.  2. Right ventricular systolic function is normal. The right ventricular size is normal.  3. The aortic valve is grossly normal. Aortic valve regurgitation is not visualized.  4. The inferior vena cava is normal in size with greater than 50% respiratory variability, suggesting right atrial pressure of 3 mmHg. FINDINGS  Left Ventricle: Right Ventricle: The right ventricular size is normal. Right vetricular wall thickness was not well visualized. Right ventricular systolic function is normal. Left Atrium: Left atrial size was normal in size. Tricuspid Valve: The tricuspid valve is grossly normal. Tricuspid valve regurgitation is trivial. Aortic Valve: The aortic valve is grossly normal. Aortic valve regurgitation is not visualized. Pulmonic Valve: The pulmonic valve was grossly normal. Pulmonic valve regurgitation is trivial. Venous: The inferior vena cava is normal in size with greater than 50% respiratory variability, suggesting right atrial pressure of 3 mmHg.  IVC IVC diam: 1.70 cm Grady Lawman MD Electronically signed by  Grady Lawman MD Signature Date/Time: 02/18/2024/1:45:44 PM    Final    EEG adult Result Date: 02/18/2024 Eleni Griffin, MD     02/19/2024 12:41 PM Routine EEG Report [name] is a 63 y.o. male with a history of altered mental status and brain tumor who is undergoing an EEG to evaluate for seizures. Report: This EEG was acquired with electrodes placed according to the International 10-20  electrode system (including Fp1, Fp2, F3, F4, C3, C4, P3, P4, O1, O2, T3, T4, T5, T6, A1, A2, Fz, Cz, Pz). The following electrodes were missing or displaced: none. The occipital dominant rhythm was 11 Hz. This activity is reactive to stimulation. Drowsiness was manifested by background fragmentation; deeper stages of sleep were not identified. There was focal slowing over the left temporal region. There were no interictal epileptiform discharges. There were no electrographic seizures identified. Photic stimulation and hyperventilation were not performed. Impression and clinical correlation: This EEG was obtained while awake and drowsy and is abnormal due to focal slowing over the left posterior region (indicating focal cerebral dysfunction). That location corresponds to the area where CT scan showed the most significant abnormality. No epileptiform abnormalities were seen during this recording. Greg Leaks, MD [facility] [phone] If 7pm- 7am, please page neurology on call as listed in AMION.       LOS: 2 days   Granvel Proudfoot  [facility] Pager on [email or web address]  02/20/2024, 9:13 AM

## 2024-02-20 NOTE — Evaluation (Signed)
 Occupational Therapy Evaluation Patient Details Name: [name] MRN: [id number] DOB: Jun 05, 1961 Today's Date: 02/20/2024   History of Present Illness   Pt is a 63 y.o. male who presented 02/17/24 with L leg pain s/p fall x1 day PTA. Pt was found to have lesions in the brain on CT head, concerning for metastases. PMH: R BKA, non-small cell lung cancer, hypothyroidism, chronic systolic CHF, CAD with PCI to LAD in 2017, hepatitis C, STEMI, AICD, asthma, COPD, DVT, PAD     Clinical Impressions Pt admitted based on above, and was seen based on problem list below. PTA pt was living with his wife reporting he was independent with ADLs, but with frequent falls. Today pt is requiring set up  to CGA for ADLs. Without his prosthesis, short mobility, pt was CGA. Pt presenting with decreased safety awareness, balance, judgement, problem solving, strength, and sequencing. Pt's family expressing concerns regarding d/c home d/t frequency of falls. Pt highly motivated and mobilizing well. Anticipate pt will progress well and would greatly benefit from <3 hours of skilled rehab daily to promote safe d/c home. OT will continue to follow acutely to maximize functional independence.        If plan is discharge home, recommend the following:   A little help with walking and/or transfers;A little help with bathing/dressing/bathroom;Direct supervision/assist for medications management;Help with stairs or ramp for entrance;Supervision due to cognitive status     Functional Status Assessment   Patient has had a recent decline in their functional status and demonstrates the ability to make significant improvements in function in a reasonable and predictable amount of time.     Equipment Recommendations   BSC/3in1     Recommendations for Other Services         Precautions/Restrictions   Precautions Precautions: Fall;Other (comment) Precaution/Restrictions Comments: has prosthesis (not currently  present); watch BP Restrictions Weight Bearing Restrictions Per Provider Order: No     Mobility Bed Mobility     General bed mobility comments: pt sitting in recliner upon entry & exit    Transfers Overall transfer level: Needs assistance Equipment used: Rolling walker (2 wheels) Transfers: Sit to/from Stand, Bed to chair/wheelchair/BSC Sit to Stand: Contact guard assist     Step pivot transfers: Contact guard assist     General transfer comment: pt's prosthesis not present but able to complete short hop ambulation in room CGA no LOB      Balance Overall balance assessment: Needs assistance Sitting-balance support: No upper extremity supported, Feet supported Sitting balance-Leahy Scale: Good     Standing balance support: Bilateral upper extremity supported, During functional activity, Reliant on assistive device for balance Standing balance-Leahy Scale: Poor Standing balance comment: Reliant on RW         ADL either performed or assessed with clinical judgement   ADL Overall ADL's : Needs assistance/impaired Eating/Feeding: Set up;Sitting   Grooming: Set up;Sitting           Upper Body Dressing : Set up;Sitting   Lower Body Dressing: Contact guard assist;Sit to/from stand Lower Body Dressing Details (indicate cue type and reason): CGA for balance Toilet Transfer: Contact guard assist;Ambulation;Rolling walker (2 wheels) Toilet Transfer Details (indicate cue type and reason): Simulated in room Toileting- Clothing Manipulation and Hygiene: Contact guard assist;Sit to/from stand Toileting - Clothing Manipulation Details (indicate cue type and reason): CGA for balance     Functional mobility during ADLs: Contact guard assist;Rolling walker (2 wheels) General ADL Comments: Good flexibility and mobility, decreased balance  Vision Baseline Vision/History: 0 No visual deficits Vision Assessment?: No apparent visual deficits            Pertinent  Vitals/Pain Pain Assessment Pain Assessment: No/denies pain Pain Intervention(s): Monitored during session     Extremity/Trunk Assessment Upper Extremity Assessment Upper Extremity Assessment: Generalized weakness   Lower Extremity Assessment Lower Extremity Assessment: Defer to PT evaluation   Cervical / Trunk Assessment Cervical / Trunk Assessment: Normal   Communication Communication Communication: No apparent difficulties   Cognition Arousal: Alert Behavior During Therapy: WFL for tasks assessed/performed Cognition: History of cognitive impairments       OT - Cognition Comments: Pt impulsive, poor safety awareness, poor STM, LTM     Following commands: Impaired Following commands impaired: Only follows one step commands consistently, Follows multi-step commands with increased time     Cueing  General Comments   Cueing Techniques: Verbal cues  Daughter present expressing safety concerns with d/c home           Home Living Family/patient expects to be discharged to:: Private residence Living Arrangements: Spouse/significant other;Other relatives Available Help at Discharge: Family;Available 24 hours/day Type of Home: Mobile home Home Access: Ramped entrance (needs to be rebuilt)     Home Layout: One level     Bathroom Shower/Tub: Producer, television/film/video: Standard   How Accessible:  (sideways) Home Equipment: Rolling Walker (2 wheels);Rollator (4 wheels);Wheelchair - Sport and exercise psychologist Comments: Per family house in poor condition, unable to actively fit RW through doorway      Prior Functioning/Environment Prior Level of Function : Needs assist             Mobility Comments: Uses prosthesis majority of time; uses RW mod I; falls daily ADLs Comments: Per daughter pt has not bathed in extensive time, falls completing standing ADLs    OT Problem List: Decreased strength;Decreased activity tolerance;Impaired balance (sitting  and/or standing);Decreased safety awareness;Decreased cognition;Decreased knowledge of use of DME or AE   OT Treatment/Interventions: Self-care/ADL training;Neuromuscular education;DME and/or AE instruction;Therapeutic exercise;Therapeutic activities;Patient/family education;Balance training      OT Goals(Current goals can be found in the care plan section)   Acute Rehab OT Goals Patient Stated Goal: To get stronger OT Goal Formulation: With patient/family Time For Goal Achievement: 03/05/24 Potential to Achieve Goals: Good   OT Frequency:  Min 2X/week       AM-PAC OT "6 Clicks" Daily Activity     Outcome Measure Help from another person eating meals?: None Help from another person taking care of personal grooming?: A Little Help from another person toileting, which includes using toliet, bedpan, or urinal?: A Little Help from another person bathing (including washing, rinsing, drying)?: A Little Help from another person to put on and taking off regular upper body clothing?: A Little Help from another person to put on and taking off regular lower body clothing?: A Little 6 Click Score: 19   End of Session Equipment Utilized During Treatment: Rolling walker (2 wheels);Gait belt Nurse Communication: Mobility status  Activity Tolerance: Patient tolerated treatment well Patient left: in chair;with call bell/phone within reach;with chair alarm set;with family/visitor present  OT Visit Diagnosis: Repeated falls (R29.6);Muscle weakness (generalized) (M62.81);History of falling (Z91.81)                Time: 1610-9604 OT Time Calculation (min): 22 min Charges:  OT General Charges $OT Visit: 1 Visit OT Evaluation $OT Eval Moderate Complexity: 1 Mod  Clarkson Rosselli C, OT  Acute  Rehabilitation Services Office [phone] Secure chat preferred   Mickael Alamo 02/20/2024, 3:17 PM

## 2024-02-20 NOTE — Progress Notes (Signed)
 Physical Therapy Treatment Patient Details Name: [name] MRN: [id number] DOB: 1961-01-01 Today's Date: 02/20/2024   History of Present Illness Pt is a 63 y.o. male who presented 02/17/24 with L leg pain s/p fall x1 day PTA. Pt was found to have lesions in the brain on CT head, concerning for metastases. PMH: R BKA, non-small cell lung cancer, hypothyroidism, chronic systolic CHF, CAD with PCI to LAD in 2017, hepatitis C, STEMI, AICD, asthma, COPD, DVT, PAD    PT Comments  Pt tolerates treatment well but remains confused during session. Pt is able to stand and ambulate for short distances with support of RW, despite being without his prosthetic leg. Pt is impulsive at times and appears to have a poor understanding of discussion regarding discharge recommendations. Pt also reports a history of many falls, even when utilizing his prosthetic and a walker. Pt remains at a high risk for falls due to poor insight into deficits and a noted history of falls and balance deficits. Patient will benefit from continued inpatient follow up therapy, <3 hours/day.    If plan is discharge home, recommend the following: A little help with walking and/or transfers;A little help with bathing/dressing/bathroom;Assistance with cooking/housework;Direct supervision/assist for medications management;Direct supervision/assist for financial management;Assist for transportation;Help with stairs or ramp for entrance;Supervision due to cognitive status   Can travel by private vehicle     Yes  Equipment Recommendations  BSC/3in1    Recommendations for Other Services       Precautions / Restrictions Precautions Precautions: Fall;Other (comment) Recall of Precautions/Restrictions: Impaired Precaution/Restrictions Comments: has prosthesis (not currently present); watch BP Restrictions Weight Bearing Restrictions Per Provider Order: No     Mobility  Bed Mobility                    Transfers Overall  transfer level: Needs assistance Equipment used: Rolling walker (2 wheels) Transfers: Sit to/from Stand Sit to Stand: Contact guard assist           General transfer comment: PT attempts to have the pt perform the 5 time sit to stand, however the pt stands initially and becomes distracted and does not recall cues for the test    Ambulation/Gait Ambulation/Gait assistance: Contact guard assist Gait Distance (Feet): 20 Feet Assistive device: Rolling walker (2 wheels) Gait Pattern/deviations:  (hop-to gait) Gait velocity: reduced Gait velocity interpretation: <1.31 ft/sec, indicative of household ambulator   General Gait Details: pt with slowed hop-to gait   Stairs             Wheelchair Mobility     Tilt Bed    Modified Rankin (Stroke Patients Only)       Balance Overall balance assessment: Needs assistance Sitting-balance support: No upper extremity supported, Feet supported Sitting balance-Leahy Scale: Good     Standing balance support: Bilateral upper extremity supported, Reliant on assistive device for balance Standing balance-Leahy Scale: Poor                              Communication Communication Communication: No apparent difficulties  Cognition Arousal: Alert Behavior During Therapy: WFL for tasks assessed/performed   PT - Cognitive impairments: History of cognitive impairments, Awareness, Memory, Sequencing, Safety/Judgement                       PT - Cognition Comments: pt providing contradictory information throughout session, initially reports he formerly made $450 and hour  at his job then later reports he did not make much money. Pt also demonstrates a poor ability to follow multi-step commands throughout session Following commands: Impaired Following commands impaired: Follows multi-step commands inconsistently    Cueing Cueing Techniques: Verbal cues, Visual cues  Exercises      General Comments General comments  (skin integrity, edema, etc.): pt in NAD      Pertinent Vitals/Pain Pain Assessment Pain Assessment: No/denies pain    Home Living Family/patient expects to be discharged to:: Private residence Living Arrangements: Spouse/significant other;Other relatives Available Help at Discharge: Family;Available 24 hours/day Type of Home: Mobile home Home Access: Ramped entrance (needs to be rebuilt)       Home Layout: One level Home Equipment: Agricultural consultant (2 wheels);Rollator (4 wheels);Wheelchair - Lawyer Comments: Per family house in poor condition, unable to actively fit RW through doorway    Prior Function            PT Goals (current goals can now be found in the care plan section) Acute Rehab PT Goals Patient Stated Goal: to not fall Progress towards PT goals: Progressing toward goals    Frequency    Min 2X/week      PT Plan      Co-evaluation              AM-PAC PT "6 Clicks" Mobility   Outcome Measure  Help needed turning from your back to your side while in a flat bed without using bedrails?: A Little Help needed moving from lying on your back to sitting on the side of a flat bed without using bedrails?: A Little Help needed moving to and from a bed to a chair (including a wheelchair)?: A Little Help needed standing up from a chair using your arms (e.g., wheelchair or bedside chair)?: A Little Help needed to walk in hospital room?: A Little Help needed climbing 3-5 steps with a railing? : Total 6 Click Score: 16    End of Session Equipment Utilized During Treatment: Gait belt Activity Tolerance: Patient tolerated treatment well Patient left: in chair;with call bell/phone within reach;with chair alarm set Nurse Communication: Mobility status PT Visit Diagnosis: Unsteadiness on feet (R26.81);Other abnormalities of gait and mobility (R26.89);Muscle weakness (generalized) (M62.81);History of falling (Z91.81);Repeated falls  (R29.6);Difficulty in walking, not elsewhere classified (R26.2)     Time: 4403-4742 PT Time Calculation (min) (ACUTE ONLY): 12 min  Charges:    $Therapeutic Activity: 8-22 mins PT General Charges $$ ACUTE PT VISIT: 1 Visit                     Rexie Catena, PT, DPT Acute Rehabilitation Office [phone]    Rexie Catena 02/20/2024, 5:14 PM

## 2024-02-20 NOTE — TOC Initial Note (Addendum)
 Transition of Care (TOC) - Initial/Assessment Note   Spoke to patient at bedside.   Patient from home with wife and sister in law. Sister in law currently staying with them to assist.   Patient has rolling walker, rollator and bedside commode/3 in1 and handicap bathroom at home.   PT recommending HHPT and OP PC .   Patient in agreement for both no preference   Randel Buss with [facility] accepted referral for home health   Referral for outpatient palliative care given to Melissa with [facility]  Patient Details  Name: [name] MRN: [id number] Date of Birth: [birth year]  Transition of Care [facility]) CM/SW Contact:    Terre Ferri, RN Phone Number: 02/20/2024, 11:07 AM  Clinical Narrative:                   Expected Discharge Plan: Home w Home Health Services Barriers to Discharge: Continued Medical Work up   Patient Goals and CMS Choice Patient states their goals for this hospitalization and ongoing recovery are:: to return to home CMS Medicare.gov Compare Post Acute Care list provided to:: Patient Choice offered to / list presented to : Patient      Expected Discharge Plan and Services   Discharge Planning Services: CM Consult Post Acute Care Choice: Home Health (outpatient palliative care) Living arrangements for the past 2 months: Single Family Home                 DME Arranged: N/A DME Agency: NA       HH Arranged: PT          Prior Living Arrangements/Services Living arrangements for the past 2 months: Single Family Home Lives with:: Spouse (sister in law staying with him at present) Patient language and need for interpreter reviewed:: Yes Do you feel safe going back to the place where you live?: Yes      Need for Family Participation in Patient Care: Yes (Comment) Care giver support system in place?: Yes (comment) Current home services: DME Criminal Activity/Legal Involvement Pertinent to Current Situation/Hospitalization: No - Comment as  needed  Activities of Daily Living   ADL Screening (condition at time of admission) Independently performs ADLs?: No Does the patient have a NEW difficulty with bathing/dressing/toileting/self-feeding that is expected to last >3 days?: No Does the patient have a NEW difficulty with getting in/out of bed, walking, or climbing stairs that is expected to last >3 days?: No Does the patient have a NEW difficulty with communication that is expected to last >3 days?: No Is the patient deaf or have difficulty hearing?: No Does the patient have difficulty seeing, even when wearing glasses/contacts?: No Does the patient have difficulty concentrating, remembering, or making decisions?: No  Permission Sought/Granted         Permission granted to share info w AGENCY: Palliative care agencies and home health agencies        Emotional Assessment Appearance:: Appears stated age Attitude/Demeanor/Rapport: Engaged Affect (typically observed): Appropriate Orientation: : Oriented to Self, Oriented to Place, Oriented to  Time, Oriented to Situation Alcohol  / Substance Use: Not Applicable Psych Involvement: No (comment)  Admission diagnosis:  Imbalance [R26.89] Fall, initial encounter [W19.XXXA] Metastatic malignant neoplasm, unspecified site [facility]) [C79.9] Lung cancer metastatic to brain [facility]) [C34.90, C79.31] Patient Active Problem List   Diagnosis Date Noted   Counseling regarding advance care planning and goals of care 02/19/2024   Imbalance 02/17/2024   Lung cancer metastatic to brain (HCC) 02/17/2024   HFrEF (heart failure  with reduced ejection fraction) (HCC) 10/01/2021   S/P implantation of automatic cardioverter/defibrillator (AICD) 10/01/2021   CAD (coronary artery disease) 10/01/2021   Aortic atherosclerosis (HCC) 10/01/2021   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   Hypothyroidism 03/27/2021   Chronic hypotension 03/24/2021   Cardiac arrest (HCC) 03/24/2021   Recurrent non-small  cell lung cancer (NSCLC) (HCC) 12/22/2020   Seizures (HCC) 06/30/2018   Seizure-like activity (HCC) 06/26/2018   Encounter for antineoplastic immunotherapy 06/12/2018   Port-A-Cath in place 04/24/2018   Non-small cell carcinoma of right lung, stage 3 (HCC) 03/22/2018   Goals of care, counseling/discussion 03/22/2018   Encounter for antineoplastic chemotherapy 03/22/2018   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/2018   Mass of upper lobe of right lung 03/08/2018   Ischemic cardiomyopathy 03/02/2018   Medication management 07/09/2016   Acute ST elevation myocardial infarction (STEMI) of lateral wall (HCC) 05/26/2016   Acute ST elevation myocardial infarction (STEMI) involving left anterior descending (LAD) coronary artery (HCC) 05/26/2016   ST elevation myocardial infarction involving left anterior descending (LAD) coronary artery (HCC)    Tobacco abuse 01/28/2016   Tubular adenoma of colon 07/11/2014   Chronic hepatitis C without hepatic coma (HCC) 05/03/2014   Encounter for screening colonoscopy 05/03/2014   PAD (peripheral artery disease) (HCC) 01/25/2013   Peripheral vascular disease, unspecified 07/20/2012   Atherosclerosis of native artery of extremity with intermittent claudication (HCC) 12/16/2011   Acute hepatitis C virus infection 06/19/2007   SEBORRHEIC KERATOSIS 06/13/2007   BLOOD CHEMISTRY, ABNORMAL 06/13/2007   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 [place] [address] Phone: [phone] Fax: [phone]  [facility] - [place], [place] - [address] [place] [address] Phone: [phone] Fax: [phone]     Social Drivers of Health (SDOH) Social History: SDOH Screenings   Food Insecurity: No Food  Insecurity (02/19/2024)  Housing: Low Risk  (02/19/2024)  Transportation Needs: No Transportation Needs (02/19/2024)  Utilities: Not At Risk (02/19/2024)  Tobacco Use: High Risk (02/17/2024)   SDOH Interventions:     Readmission Risk Interventions     No data to display

## 2024-02-20 NOTE — Progress Notes (Signed)
*  PRELIMINARY RESULTS* Echocardiogram Limited 2-D Echoacardiogram has been performed.  [name] 02/20/2024, 1:11 PM

## 2024-02-21 ENCOUNTER — Other Ambulatory Visit: Payer: Self-pay | Admitting: Radiation Oncology

## 2024-02-21 ENCOUNTER — Inpatient Hospital Stay (HOSPITAL_COMMUNITY)

## 2024-02-21 ENCOUNTER — Ambulatory Visit
Admit: 2024-02-21 | Discharge: 2024-02-21 | Disposition: A | Discharge status: Home / Self Care | Attending: Radiation Oncology | Admitting: Radiation Oncology

## 2024-02-21 DIAGNOSIS — C7931 Secondary malignant neoplasm of brain: Secondary | ICD-10-CM | POA: Diagnosis not present

## 2024-02-21 DIAGNOSIS — I5020 Unspecified systolic (congestive) heart failure: Secondary | ICD-10-CM | POA: Diagnosis not present

## 2024-02-21 DIAGNOSIS — C3490 Malignant neoplasm of unspecified part of unspecified bronchus or lung: Secondary | ICD-10-CM | POA: Diagnosis not present

## 2024-02-21 DIAGNOSIS — C3411 Malignant neoplasm of upper lobe, right bronchus or lung: Secondary | ICD-10-CM | POA: Diagnosis not present

## 2024-02-21 DIAGNOSIS — R2689 Other abnormalities of gait and mobility: Secondary | ICD-10-CM | POA: Diagnosis not present

## 2024-02-21 DIAGNOSIS — E039 Hypothyroidism, unspecified: Secondary | ICD-10-CM | POA: Diagnosis not present

## 2024-02-21 DIAGNOSIS — F17210 Nicotine dependence, cigarettes, uncomplicated: Secondary | ICD-10-CM | POA: Diagnosis not present

## 2024-02-21 MED ORDER — MAGNESIUM SULFATE 4 GM/100ML IV SOLN
4.0000 g | Freq: Once | INTRAVENOUS | Status: AC
  Administered 2024-02-21: 4 g via INTRAVENOUS
  Filled 2024-02-21 (×2): qty 100

## 2024-02-21 MED ORDER — DEXAMETHASONE 4 MG PO TABS: 4.0000 mg | ORAL_TABLET | Freq: Three times a day (TID) | ORAL | 1 refills | Status: DC

## 2024-02-21 MED ORDER — DEXAMETHASONE 4 MG PO TABS
4.0000 mg | ORAL_TABLET | Freq: Three times a day (TID) | ORAL | Status: DC
  Administered 2024-02-21 – 2024-02-27 (×19): 4 mg via ORAL
  Filled 2024-02-21 (×21): qty 1

## 2024-02-21 MED ORDER — GADOBUTROL 1 MMOL/ML IV SOLN
8.0000 mL | Freq: Once | INTRAVENOUS | Status: AC | PRN
  Administered 2024-02-21: 8 mL via INTRAVENOUS

## 2024-02-21 NOTE — Plan of Care (Signed)

## 2024-02-21 NOTE — Plan of Care (Signed)
  Problem: Clinical Measurements: Goal: Will remain free from infection Outcome: Progressing Goal: Cardiovascular complication will be avoided Outcome: Progressing   Problem: Activity: Goal: Risk for activity intolerance will decrease Outcome: Progressing   Problem: Nutrition: Goal: Adequate nutrition will be maintained Outcome: Progressing   Problem: Elimination: Goal: Will not experience complications related to urinary retention Outcome: Progressing   Problem: Safety: Goal: Ability to remain free from injury will improve Outcome: Progressing

## 2024-02-21 NOTE — Progress Notes (Signed)
 Brief oncology note: Medical oncology/Dr. Marguerita Shih is aware of patient's admission.  Dr. Marguerita Shih will round on patient tomorrow 02/22/24.  Please send message if any oncologic concerns or questions in the interim.

## 2024-02-21 NOTE — Progress Notes (Signed)
 This chaplain responded to PMT PA-Josseline's consult for Pt. prayer. Family is at the bedside at the time of the visit.   The chaplain listened reflectively as the Pt. shared the joy he receives from family and time spent in prayer since his BKA.  The chaplain stepped away after an invitation for a revisit as additional family arrived in the room.  This chaplain is available for F/U spiritual care as needed.  Chaplain Kathleene Papas [phone]

## 2024-02-21 NOTE — Progress Notes (Signed)
 Report has been given to nurse Tyana at [facility] oncology.

## 2024-02-21 NOTE — Consult Note (Signed)
 Radiation Oncology         [phone] ________________________________  Name: [name]        MRN: [id number]  Date of Service: 02/21/24 DOB: [birth year]  CC:Mark Parkins, MD     REFERRING PHYSICIAN: Dr. Lyndon Costa   DIAGNOSIS: The primary encounter diagnosis was Metastatic malignant neoplasm, unspecified site [facility]). A diagnosis of Fall, initial encounter was also pertinent to this visit.   HISTORY OF PRESENT ILLNESS: [name] is a 63 y.o. male seen at the request of Dr. 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  He completed his radiation on 05/18/2018 and was followed by Dr. Marguerita Costa and received consolidative immunotherapy which he completed on 07/03/2020.  He was found to have recurrent these in June 2021 after presenting with hypermetabolic activity in the right apex and right upper lobe.  He resumed systemic immunotherapy on 02/27/2020 and continued this through 02/02/2021.  Chemotherapy was added back with carboplatin  and Alimta  in June 2022 and he completed the carboplatin  component in August 2022 but continued Alimta  as a single agent until 10/14/2021.  He had toxicity so his treatment was on hold, and was last seen by Dr. Marguerita Costa in November 2023 to resume surveillance.  The patient has been lost to follow-up, until he presented with instability and fall to the ED on 02/17/24 and imaging with CT of the head showed vasogenic edema in the left occipital lobe and left cerebellum with associated mass lesions and additional edema in the left parietal lobe and anterior right frontal lobe and edema in the cerebellum resulting in partial effacement of the fourth ventricle and quadrigeminal cistern. A CT cervical spine was negative for acute findings, but CT CAP also the same day showed increased mass like  consolidation of the RUL and what appeared to be multifocal pulmonary and nodal sites of disease in addition to chronic gas and fluid collection in the right lung apex with air fluid level and bronchopleural fistula. No other disease was noted.   An MRI brain was performed today and showed at least 18 metastatic deposits in the brain including punctate findings up to 2.4 cm lesion in the left cerebellum with associated edema but no shift or other acute findings. He is taking dexamethasone  4 mg q 6 hours currently. He's contacted by phone today to discuss recommendations.   PREVIOUS RADIATION THERAPY: Yes   04/04/2018 - 05/18/2018: The patient was treated to the disease within the RUL lung initially to a dose of 60 Gy using a 1 field, 3-D conformal technique. The patient then received a cone down boost treatment for an additional 6 Gy. This yielded a final total dose of 66 Gy.      PAST MEDICAL HISTORY:  Past Medical History:  Diagnosis Date   Acute hepatitis C virus infection 06/19/2007   Qualifier: Diagnosis of  By: [name] CMA, [name]     Acute ST elevation myocardial infarction (STEMI) involving left anterior descending (LAD) coronary artery (HCC) 05/26/2016   Acute ST elevation myocardial infarction (STEMI) of lateral wall (HCC) 05/26/2016   AICD (automatic cardioverter/defibrillator) present 03/02/2018   Anxiety    Asthma    Atherosclerosis of native arteries of the extremities with intermittent claudication 12/16/2011   Chronic hepatitis C without hepatic coma (HCC) 05/03/2014   COPD with chronic bronchitis and emphysema (HCC)  03/13/2018   FEV1 57%   Depression    DVT (deep venous thrombosis) (HCC) 2009; 2019   RLE; LLE   Encounter for antineoplastic chemotherapy 03/22/2018   Hepatitis C    "tx'd in 2015"   High cholesterol    Ischemic cardiomyopathy 03/02/2018   Non-small cell carcinoma of right lung, stage 3 (HCC) 03/22/2018   PAD (peripheral artery disease) (HCC) 01/25/2013    Peripheral vascular disease, unspecified 07/20/2012   Port-A-Cath in place 04/24/2018   ST elevation myocardial infarction involving left anterior descending (LAD) coronary artery (HCC)    STEMI (ST elevation myocardial infarction) (HCC) 05/26/2016   Tobacco abuse 01/28/2016   Tubular adenoma of colon 07/11/2014   Urinary dribbling        PAST SURGICAL HISTORY: Past Surgical History:  Procedure Laterality Date   AORTOGRAM  08/08/2009   for LLE claudication     By Dr. Nolene Costa   BELOW KNEE LEG AMPUTATION Right 04/25/2008   BIOPSY N/A 05/30/2014   Procedure: BIOPSY;  Surgeon: Mark Espy, MD;  Location: [facility];  Service: Endoscopy;  Laterality: N/A;   BLADDER TUMOR EXCISION  8/812   CARDIAC CATHETERIZATION N/A 05/26/2016   Procedure: Left Heart Cath and Coronary Angiography;  Surgeon: Mark Ally, MD;  Location: [facility] INVASIVE CV LAB;  Service: Cardiovascular;  Laterality: N/A;   CARDIAC CATHETERIZATION N/A 05/26/2016   Procedure: Coronary Stent Intervention;  Surgeon: Mark Ally, MD;  Location: [facility] INVASIVE CV LAB;  Service: Cardiovascular;  Laterality: N/A;   COLONOSCOPY WITH PROPOFOL  N/A 05/30/2014   WUJ:WJXBJY colonic polyp-likely source of hematochezia-removed as described above   ESOPHAGOGASTRODUODENOSCOPY (EGD) WITH PROPOFOL  N/A 05/30/2014   NWG:NFAOZH and bulbar erosions s/p gastric biopsy. No evidence of portal gastropathy on today's examination.   FEMORAL-TIBIAL BYPASS GRAFT  2009   Right side using non-reversed GSV   By Dr. Kittie Perking BYPASS GRAFT  11/24/2007   Right femoral to anterior tibial BPG   by Dr. Nolene Costa   FINGER SURGERY Left    "straightened my pinky"   HAND TENDON SURGERY Left 2013   Left 5th finger   HERNIA REPAIR     "stomach"   ICD IMPLANT N/A 03/02/2018   Procedure: ICD IMPLANT;  Surgeon: Mark Pump, MD;  Location: [facility] INVASIVE CV LAB;  Service: Cardiovascular;  Laterality: N/A;   INCISIONAL HERNIA REPAIR N/A 12/25/2014   Procedure: HERNIA  REPAIR INCISIONAL WITH MESH;  Surgeon: Mark Allegra Md, MD;  Location: [facility];  Service: General;  Laterality: N/A;   INSERTION OF MESH N/A 12/25/2014   Procedure: INSERTION OF MESH;  Surgeon: Mark Allegra Md, MD;  Location: [facility];  Service: General;  Laterality: N/A;   IR IMAGING GUIDED PORT INSERTION  04/11/2018   LAPAROSCOPIC CHOLECYSTECTOMY     LOWER EXTREMITY ANGIOGRAM Left 12/30/2015   Procedure: Lower Extremity Angiogram;  Surgeon: Richrd Char, MD;  Location: [facility] INVASIVE CV LAB;  Service: Cardiovascular;  Laterality: Left;   PERIPHERAL VASCULAR CATHETERIZATION N/A 12/30/2015   Procedure: Abdominal Aortogram;  Surgeon: Richrd Char, MD;  Location[facility] INVASIVE CV LAB;  Service: Cardiovascular;  Laterality: N/A;   POLYPECTOMY  05/30/2014   Procedure: POLYPECTOMY;  Surgeon: Mark Espy, MD;  Location: [facility];  Service: Endoscopy;;   TONSILLECTOMY AND ADENOIDECTOMY  ~ 1970   TYMPANOSTOMY TUBE PLACEMENT Bilateral ~ 1970   VIDEO ASSISTED THORACOSCOPY Left 10/04/2021   Procedure: PERICARDIAL WINDOW VIDEO ASSISTED THORACOSCOPY APPROACH;  Surgeon: Hilarie Lovely, MD;  Location: [facility];  Service: Thoracic;  Laterality: Left;  pericardial window.  full lateral     FAMILY HISTORY:  Family History  Problem Relation Age of Onset   Vision loss Mother    Ulcers Father    Colon cancer Neg Hx      SOCIAL HISTORY:  reports that he has been smoking cigarettes. He has never used smokeless tobacco. He reports that he does not currently use alcohol  after a past usage of about 18.0 standard drinks of alcohol  per week. He reports current drug use. Drug: Marijuana. The patient is married and lives in [place]. He has aging parents who live in [place], [place] he goes back and forth to help often.    ALLERGIES: Lyrica [pregabalin] and Neurontin [gabapentin]   MEDICATIONS:  Current Facility-Administered Medications  Medication Dose Route Frequency Provider Last Rate Last Admin   acetaminophen   (TYLENOL ) tablet 650 mg  650 mg Oral Q6H PRN Krishnan, Gokul, MD       Or   acetaminophen  (TYLENOL ) suppository 650 mg  650 mg Rectal Q6H PRN Krishnan, Gokul, MD       ALPRAZolam  (XANAX ) tablet 2 mg  2 mg Oral TID PRN Krishnan, Gokul, MD   2 mg at 02/20/24 1723   amitriptyline  (ELAVIL ) tablet 100 mg  100 mg Oral QHS Chavez, Abigail, NP   100 mg at 02/20/24 2151   Chlorhexidine  Gluconate Cloth 2 % PADS 6 each  6 each Topical Daily Krishnan, Gokul, MD   6 each at 02/20/24 0865   citalopram  (CELEXA ) tablet 40 mg  40 mg Oral Daily Krishnan, Gokul, MD   40 mg at 02/21/24 7846   dexamethasone  (DECADRON ) injection 4 mg  4 mg Intravenous Q6H Krishnan, Gokul, MD   4 mg at 02/21/24 9629   feeding supplement (ENSURE ENLIVE / ENSURE PLUS) liquid 237 mL  237 mL Oral BID BM Krishnan, Gokul, MD   237 mL at 02/20/24 1359   HYDROcodone -acetaminophen  (NORCO) 10-325 MG per tablet 1 tablet  1 tablet Oral Q4H PRN Krishnan, Gokul, MD   1 tablet at 02/20/24 1125   leptospermum manuka honey (MEDIHONEY) paste 1 Application  1 Application Topical Daily Krishnan, Gokul, MD   1 Application at 02/21/24 0810   levETIRAcetam  (KEPPRA ) tablet 500 mg  500 mg Oral BID Krishnan, Gokul, MD   500 mg at 02/21/24 0809   levothyroxine  (SYNTHROID ) tablet 175 mcg  175 mcg Oral QAC breakfast Krishnan, Gokul, MD   175 mcg at 02/21/24 0618   liver oil-zinc oxide (DESITIN) 40 % ointment   Topical BID Krishnan, Gokul, MD   Given at 02/21/24 [phone]   midodrine  (PROAMATINE ) tablet 10 mg  10 mg Oral TID WC Krishnan, Gokul, MD   10 mg at 02/21/24 0809   nicotine  (NICODERM CQ  - dosed in mg/24 hours) patch 14 mg  14 mg Transdermal Daily Krishnan, Gokul, MD   14 mg at 02/21/24 1324   ondansetron  (ZOFRAN ) tablet 4 mg  4 mg Oral Q6H PRN Krishnan, Gokul, MD       Or   ondansetron  (ZOFRAN ) injection 4 mg  4 mg Intravenous Q6H PRN Krishnan, Gokul, MD       polyethylene glycol (MIRALAX  / GLYCOLAX ) packet 17 g  17 g Oral Daily Krishnan, Gokul, MD        senna-docusate (Senokot-S) tablet 2 tablet  2 tablet Oral BID Krishnan, Gokul, MD   2 tablet at 02/21/24 0809   sodium chloride  flush (NS) 0.9 % injection 10-40  mL  10-40 mL Intracatheter Q12H Maylene Spear, MD   10 mL at 02/21/24 1610   sodium chloride  flush (NS) 0.9 % injection 10-40 mL  10-40 mL Intracatheter PRN Maylene Spear, MD         REVIEW OF SYSTEMS: On review of systems, the patient reports that he is doing okay and had been dizzy and having difficulty with balance and recent falls. No other complaints are verbalized.      PHYSICAL EXAM:  Wt Readings from Last 3 Encounters:  02/17/24 191 lb 12.8 oz (87 kg)  07/08/23 192 lb 0.3 oz (87.1 kg)  07/29/22 192 lb (87.1 kg)   Temp Readings from Last 3 Encounters:  02/21/24 97.6 F (36.4 C) (Oral)  07/08/23 98.2 F (36.8 C) (Oral)  07/29/22 98.1 F (36.7 C) (Oral)   BP Readings from Last 3 Encounters:  02/21/24 105/64  07/08/23 90/63  07/29/22 104/81   Pulse Readings from Last 3 Encounters:  02/21/24 77  07/08/23 85  07/29/22 95   Pain Assessment Pain Score: 0-No pain/10  Unable to assess due to encounter type.    ECOG = 1  0 - Asymptomatic (Fully active, able to carry on all predisease activities without restriction)  1 - Symptomatic but completely ambulatory (Restricted in physically strenuous activity but ambulatory and able to carry out work of a light or sedentary nature. For example, light housework, office work)  2 - Symptomatic, <50% in bed during the day (Ambulatory and capable of all self care but unable to carry out any work activities. Up and about more than 50% of waking hours)  3 - Symptomatic, >50% in bed, but not bedbound (Capable of only limited self-care, confined to bed or chair 50% or more of waking hours)  4 - Bedbound (Completely disabled. Cannot carry on any self-care. Totally confined to bed or chair)  5 - Death   Aurea Blossom MM, Creech RH, Tormey DC, et al. [phone]). "Toxicity and response  criteria of the [facility]". Am. Hillard Lowes. Oncol. 5 (6): 649-55    LABORATORY DATA:  Lab Results  Component Value Date   WBC 9.4 02/18/2024   HGB 13.0 02/18/2024   HCT 40.1 02/18/2024   MCV 83.7 02/18/2024   PLT 405 (H) 02/18/2024   Lab Results  Component Value Date   NA 132 (L) 02/20/2024   K 4.0 02/20/2024   CL 97 (L) 02/20/2024   CO2 27 02/20/2024   Lab Results  Component Value Date   ALT 10 02/18/2024   AST 15 02/18/2024   ALKPHOS 84 02/18/2024   BILITOT 0.9 02/18/2024      RADIOGRAPHY: ECHOCARDIOGRAM LIMITED Result Date: 02/20/2024    ECHOCARDIOGRAM LIMITED REPORT   Patient Name:   ADEBAYO ENSMINGER Date of Exam: 02/20/2024 Medical Rec #:  [id number]   Height:       72.0 in Accession #:    [id number]  Weight:       191.8 lb Date of Birth:  [birth year]   BSA:          2.093 m Patient Age:    63 years    BP:           94/68 mmHg Patient Gender: M           HR:           79 bpm. Exam Location:  Inpatient Procedure: Limited Echo (Both Spectral and Color Flow Doppler were utilized  during procedure). Indications:    Reattempt to see if LVEF can be calculated  History:        Patient has prior history of Echocardiogram examinations, most                 recent 02/18/2024. CAD, COPD; Risk Factors:Current Smoker.  Sonographer:    Denese Finn RCS Referring Phys: [phone] [facility]  Sonographer Comments: Technically difficult study due to poor echo windows. IMPRESSIONS  1. Global hypokinesis with abnormal septal motion . Left ventricular ejection fraction, by estimation, is 30 to 35%. The left ventricle has moderately decreased function. The left ventricle demonstrates global hypokinesis. There is mild left ventricular  hypertrophy.  2. Device lead in RV. Right ventricular systolic function is normal. The right ventricular size is normal.  3. The inferior vena cava is normal in size with <50% respiratory variability, suggesting right atrial pressure of 8 mmHg.  FINDINGS  Left Ventricle: Global hypokinesis with abnormal septal motion. Left ventricular ejection fraction, by estimation, is 30 to 35%. The left ventricle has moderately decreased function. The left ventricle demonstrates global hypokinesis. There is mild left  ventricular hypertrophy. Right Ventricle: Device lead in RV. The right ventricular size is normal. No increase in right ventricular wall thickness. Right ventricular systolic function is normal. Pericardium: There is no evidence of pericardial effusion. Venous: The inferior vena cava is normal in size with less than 50% respiratory variability, suggesting right atrial pressure of 8 mmHg. Additional Comments: 3D was performed not requiring image post processing on an independent workstation and was indeterminate. A device lead is visualized.  LEFT VENTRICLE PLAX 2D LVIDd:         5.30 cm LVIDs:         4.80 cm LV PW:         1.00 cm LV IVS:        1.20 cm  LV Volumes (MOD) LV vol d, MOD A2C: 69.9 ml LV vol d, MOD A4C: 128.0 ml LV vol s, MOD A2C: 52.0 ml LV vol s, MOD A4C: 88.4 ml LV SV MOD A2C:     17.9 ml LV SV MOD A4C:     128.0 ml LV SV MOD BP:      32.2 ml LEFT ATRIUM         Index LA diam:    3.10 cm 1.48 cm/m   AORTA Ao Root diam: 3.40 cm Janelle Mediate MD Electronically signed by Janelle Mediate MD Signature Date/Time: 02/20/2024/1:23:07 PM    Final    ECHOCARDIOGRAM COMPLETE Result Date: 02/18/2024    ECHOCARDIOGRAM REPORT   Patient Name:   BASIL BUFFIN Date of Exam: 02/18/2024 Medical Rec #:  [id number]   Height:       72.0 in Accession #:    [id number]  Weight:       191.8 lb Date of Birth:  11-12-1960   BSA:          2.093 m Patient Age:    63 years    BP:           90/61 mmHg Patient Gender: M           HR:           90 bpm. Exam Location:  Inpatient Procedure: 2D Echo, Cardiac Doppler and Color Doppler (Both Spectral and Color            Flow Doppler were utilized during procedure). Indications:    CHF-Acute Systolic I50.21  History:        Patient  has prior history of Echocardiogram examinations, most                 recent 10/08/2021. Cardiomyopathy, Acute MI and CAD, COPD,                 Signs/Symptoms:Hypotension; Risk Factors:Current Smoker.  Sonographer:    Terrilee Few RCS Referring Phys: [id number] Maylene Spear IMPRESSIONS  1. Limited study due to poor acoustic windows. LV function cannot be assessed.  2. Right ventricular systolic function is normal. The right ventricular size is normal.  3. The aortic valve is grossly normal. Aortic valve regurgitation is not visualized.  4. The inferior vena cava is normal in size with greater than 50% respiratory variability, suggesting right atrial pressure of 3 mmHg. FINDINGS  Left Ventricle: Right Ventricle: The right ventricular size is normal. Right vetricular wall thickness was not well visualized. Right ventricular systolic function is normal. Left Atrium: Left atrial size was normal in size. Tricuspid Valve: The tricuspid valve is grossly normal. Tricuspid valve regurgitation is trivial. Aortic Valve: The aortic valve is grossly normal. Aortic valve regurgitation is not visualized. Pulmonic Valve: The pulmonic valve was grossly normal. Pulmonic valve regurgitation is trivial. Venous: The inferior vena cava is normal in size with greater than 50% respiratory variability, suggesting right atrial pressure of 3 mmHg.  IVC IVC diam: 1.70 cm Grady Lawman MD Electronically signed by Grady Lawman MD Signature Date/Time: 02/18/2024/1:45:44 PM    Final    EEG adult Result Date: 02/18/2024 Eleni Griffin, MD     02/19/2024 12:41 PM Routine EEG Report DAVIDSON PALMIERI is a 63 y.o. male with a history of altered mental status and brain tumor who is undergoing an EEG to evaluate for seizures. Report: This EEG was acquired with electrodes placed according to the International 10-20 electrode system (including Fp1, Fp2, F3, F4, C3, C4, P3, P4, O1, O2, T3, T4, T5, T6, A1, A2, Fz, Cz, Pz). The following electrodes were  missing or displaced: none. The occipital dominant rhythm was 11 Hz. This activity is reactive to stimulation. Drowsiness was manifested by background fragmentation; deeper stages of sleep were not identified. There was focal slowing over the left temporal region. There were no interictal epileptiform discharges. There were no electrographic seizures identified. Photic stimulation and hyperventilation were not performed. Impression and clinical correlation: This EEG was obtained while awake and drowsy and is abnormal due to focal slowing over the left posterior region (indicating focal cerebral dysfunction). That location corresponds to the area where CT scan showed the most significant abnormality. No epileptiform abnormalities were seen during this recording. Greg Leaks, MD [facility] [phone] If 7pm- 7am, please page neurology on call as listed in AMION.   VAS US  LOWER EXTREMITY VENOUS (DVT) (7a-7p) Result Date: 02/18/2024  Lower Venous DVT Study Patient Name:  CORDAY WYKA  Date of Exam:   02/17/2024 Medical Rec #: [id number]    Accession #:    [id number] Date of Birth: 1961-09-13    Patient Gender: M Patient Age:   35 years Exam Location:  [facility] Procedure:      VAS US  LOWER EXTREMITY VENOUS (DVT) Referring Phys: Aleatha Hunting --------------------------------------------------------------------------------  Indications: Edema.  Comparison Study: No prior studies. Performing Technologist: Lerry Ransom RVT  Examination Guidelines: A complete evaluation includes B-mode imaging, spectral Doppler, color Doppler, and power Doppler as needed of all accessible portions of each vessel. Bilateral testing is considered  an integral part of a complete examination. Limited examinations for reoccurring indications may be performed as noted. The reflux portion of the exam is performed with the patient in reverse Trendelenburg.   +-----+---------------+---------+-----------+----------+--------------+ RIGHTCompressibilityPhasicitySpontaneityPropertiesThrombus Aging +-----+---------------+---------+-----------+----------+--------------+ CFV  Full           Yes      Yes                                 +-----+---------------+---------+-----------+----------+--------------+   +---------+---------------+---------+-----------+----------+--------------+ LEFT     CompressibilityPhasicitySpontaneityPropertiesThrombus Aging +---------+---------------+---------+-----------+----------+--------------+ CFV      Full           Yes      Yes                                 +---------+---------------+---------+-----------+----------+--------------+ SFJ      Full                                                        +---------+---------------+---------+-----------+----------+--------------+ FV Prox  Full                                                        +---------+---------------+---------+-----------+----------+--------------+ FV Mid   Full                                                        +---------+---------------+---------+-----------+----------+--------------+ FV DistalFull                                                        +---------+---------------+---------+-----------+----------+--------------+ PFV      Full                                                        +---------+---------------+---------+-----------+----------+--------------+ POP      Full           Yes      Yes                                 +---------+---------------+---------+-----------+----------+--------------+ PTV      Full                                                        +---------+---------------+---------+-----------+----------+--------------+ PERO     Full                                                         +---------+---------------+---------+-----------+----------+--------------+  Summary: RIGHT: - No evidence of common femoral vein obstruction.   LEFT: - There is no evidence of deep vein thrombosis in the lower extremity.  - No cystic structure found in the popliteal fossa.  *See table(s) above for measurements and observations. Electronically signed by Irvin Mantel on 02/18/2024 at 10:21:28 AM.    Final    CT CHEST ABDOMEN PELVIS W CONTRAST Result Date: 02/17/2024 CLINICAL DATA:  Metastatic disease evaluation EXAM: CT CHEST, ABDOMEN, AND PELVIS WITH CONTRAST TECHNIQUE: Multidetector CT imaging of the chest, abdomen and pelvis was performed following the standard protocol during bolus administration of intravenous contrast. RADIATION DOSE REDUCTION: This exam was performed according to the departmental dose-optimization program which includes automated exposure control, adjustment of the mA and/or kV according to patient size and/or use of iterative reconstruction technique. CONTRAST:  75mL OMNIPAQUE  IOHEXOL  350 MG/ML SOLN COMPARISON:  Chest radiographs 02/17/2024; CTA chest 07/08/2023; CT abdomen pelvis 07/26/2022 FINDINGS: CT CHEST FINDINGS Cardiovascular: No pericardial effusion. Normal heart size. Aortic and coronary artery atherosclerotic calcification. Left chest wall pacemaker. Right chest wall Port-A-Cath tip in the right atrium. Mediastinum/Nodes: 9 mm anterior pericardial node on series 3/image 41. 1.1 cm right paratracheal node on series 3/image 17. 1.4 cm subcarinal node on series 3/image 23. These have increased compared to 07/26/2022 when they were normal in size. Soft tissue thickening along the right mainstem bronchus and right upper and middle lobe bronchi. Unremarkable esophagus. Lungs/Pleura: Chronic post treatment change in the right apex. Increased right upper lobe collapse and consolidation. Chronic gas and fluid collection in the right apex with air-fluid level. This appears to  communicate with the tracheal bronchial tree (series 3/image 19). Masslike consolidation in the right upper lobe has increased compared to 07/08/2023 now measuring 6.3 x 5.2 cm, previously 2.7 x 1.8 cm. Increased size and number of multiple pulmonary nodules bilaterally. For example 2.1 cm nodule in the posteromedial right lower lobe on series 5/image 82 previously measured 0.9 cm. 1.6 cm nodule in the left upper lobe on series 5/image 51 previously measured 0.9 cm. 1.7 cm nodule in the posterior left lower lobe on series 5/image 117 is new. Trace left pleural effusion. Emphysema. Musculoskeletal: No acute fracture or destructive osseous lesion. CT ABDOMEN PELVIS FINDINGS Hepatobiliary: Cholecystectomy. No focal hepatic lesion. No biliary dilation. Pancreas: Unremarkable. Spleen: Unremarkable. Adrenals/Urinary Tract: Normal adrenal glands. No urinary calculi or hydronephrosis. Unremarkable bladder. Stomach/Bowel: Normal caliber large and small bowel. Stomach is within normal limits. No bowel wall thickening. Vascular/Lymphatic: Aortic atherosclerosis. No enlarged abdominal or pelvic lymph nodes. Reproductive: No acute abnormality. Other: No free intraperitoneal fluid or air. Musculoskeletal: No acute fracture or destructive osseous lesion. IMPRESSION: 1. Increased masslike consolidation in the right upper lobe concerning for progression of disease/occurrence. 2. Worsening pulmonary and nodal metastases. 3. Chronic gas and fluid collection in the right apex with air-fluid level and bronchopleural fistula. 4. No evidence of metastatic disease in the abdomen or pelvis. Aortic Atherosclerosis (ICD10-I70.0) and Emphysema (ICD10-J43.9). Electronically Signed   By: Rozell Cornet M.D.   On: 02/17/2024 21:49   CT Head Wo Contrast Result Date: 02/17/2024 CLINICAL DATA:  Trauma, fall. EXAM: CT HEAD WITHOUT CONTRAST CT CERVICAL SPINE WITHOUT CONTRAST TECHNIQUE: Multidetector CT imaging of the head and cervical spine was  performed following the standard protocol without intravenous contrast. Multiplanar CT image reconstructions of the cervical spine were also generated. RADIATION DOSE REDUCTION: This exam was performed according to the departmental dose-optimization program which includes automated exposure control, adjustment of the  mA and/or kV according to patient size and/or use of iterative reconstruction technique. COMPARISON:  CT head 03/28/2021. FINDINGS: CT HEAD FINDINGS Brain: No acute intracranial hemorrhage. There is vasogenic edema within the left occipital lobe extending into the posterior left temporal lobe. Suggestion of a heterogeneous 1.2 cm masslike lesion within this region. Additional edema within the left cerebellar hemisphere with an associated 1.4 cm lesion. Edema extends into the cerebellar vermis with associated mass effect and partial effacement of the fourth ventricle. There is also partial effacement of the quadrigeminal cistern more pronounced on the left. Additional foci of vasogenic edema in the left parietal lobe and anterior right frontal lobe. Partial effacement of the left occipital and temporal horns. No hydrocephalus. No midline shift. Vascular: No hyperdense vessel or unexpected calcification. Skull: Normal. Negative for fracture or focal lesion. Sinuses/Orbits: No acute finding. Orbits are symmetric. Mucosal thickening and mucous retention cysts in the maxillary sinuses. Other: None. CT CERVICAL SPINE FINDINGS Alignment: Cervical lordosis is maintained. Trace stepwise retrolisthesis of C4 on C5, C5 on C6, and C6 on C7. No facet subluxation or dislocation. Skull base and vertebrae: No compression fracture or displaced fracture in the cervical spine. Soft tissues and spinal canal: No prevertebral fluid or swelling. No visible canal hematoma. Disc levels: Mild disc space narrowing at multiple levels. There are small disc bulges throughout the cervical spine. Mild spinal canal stenosis at C4-5.  Facet arthrosis at multiple levels most pronounced on the left at C3-4. No high-grade osseous foraminal narrowing. Upper chest: Complete opacification of the partially visualized right lung apex. Right-sided central venous catheter. Emphysema in the left lung apex. Other: None. IMPRESSION: Vasogenic edema in the left occipital lobe and left cerebellum with associated mass lesions. Additional vasogenic edema in the left parietal lobe and anterior right frontal lobe. Findings concerning for metastatic disease. Recommend contrast enhanced MRI brain for further evaluation. Edema in the cerebellum results in partial effacement of the fourth ventricle and quadrigeminal cistern. No hydrocephalus. No acute intracranial hemorrhage. No acute fracture or traumatic malalignment of the cervical spine. Complete opacification of the right lung apex. Emphysema in the left lung apex. Electronically Signed   By: Denny Flack M.D.   On: 02/17/2024 15:42   CT Cervical Spine Wo Contrast Result Date: 02/17/2024 CLINICAL DATA:  Trauma, fall. EXAM: CT HEAD WITHOUT CONTRAST CT CERVICAL SPINE WITHOUT CONTRAST TECHNIQUE: Multidetector CT imaging of the head and cervical spine was performed following the standard protocol without intravenous contrast. Multiplanar CT image reconstructions of the cervical spine were also generated. RADIATION DOSE REDUCTION: This exam was performed according to the departmental dose-optimization program which includes automated exposure control, adjustment of the mA and/or kV according to patient size and/or use of iterative reconstruction technique. COMPARISON:  CT head 03/28/2021. FINDINGS: CT HEAD FINDINGS Brain: No acute intracranial hemorrhage. There is vasogenic edema within the left occipital lobe extending into the posterior left temporal lobe. Suggestion of a heterogeneous 1.2 cm masslike lesion within this region. Additional edema within the left cerebellar hemisphere with an associated 1.4 cm  lesion. Edema extends into the cerebellar vermis with associated mass effect and partial effacement of the fourth ventricle. There is also partial effacement of the quadrigeminal cistern more pronounced on the left. Additional foci of vasogenic edema in the left parietal lobe and anterior right frontal lobe. Partial effacement of the left occipital and temporal horns. No hydrocephalus. No midline shift. Vascular: No hyperdense vessel or unexpected calcification. Skull: Normal. Negative for fracture or focal lesion.  Sinuses/Orbits: No acute finding. Orbits are symmetric. Mucosal thickening and mucous retention cysts in the maxillary sinuses. Other: None. CT CERVICAL SPINE FINDINGS Alignment: Cervical lordosis is maintained. Trace stepwise retrolisthesis of C4 on C5, C5 on C6, and C6 on C7. No facet subluxation or dislocation. Skull base and vertebrae: No compression fracture or displaced fracture in the cervical spine. Soft tissues and spinal canal: No prevertebral fluid or swelling. No visible canal hematoma. Disc levels: Mild disc space narrowing at multiple levels. There are small disc bulges throughout the cervical spine. Mild spinal canal stenosis at C4-5. Facet arthrosis at multiple levels most pronounced on the left at C3-4. No high-grade osseous foraminal narrowing. Upper chest: Complete opacification of the partially visualized right lung apex. Right-sided central venous catheter. Emphysema in the left lung apex. Other: None. IMPRESSION: Vasogenic edema in the left occipital lobe and left cerebellum with associated mass lesions. Additional vasogenic edema in the left parietal lobe and anterior right frontal lobe. Findings concerning for metastatic disease. Recommend contrast enhanced MRI brain for further evaluation. Edema in the cerebellum results in partial effacement of the fourth ventricle and quadrigeminal cistern. No hydrocephalus. No acute intracranial hemorrhage. No acute fracture or traumatic  malalignment of the cervical spine. Complete opacification of the right lung apex. Emphysema in the left lung apex. Electronically Signed   By: Denny Flack M.D.   On: 02/17/2024 15:42   DG Foot Complete Left Result Date: 02/17/2024 CLINICAL DATA:  Pain after fall. EXAM: LEFT FOOT - COMPLETE 3+ VIEW COMPARISON:  07/07/2023 FINDINGS: There is no evidence of fracture or dislocation. Again seen degenerative changes of the first metatarsal phalangeal joint and dorsal midfoot. Soft tissues are unremarkable. IMPRESSION: No fracture or dislocation of the left foot. Electronically Signed   By: Chadwick Colonel M.D.   On: 02/17/2024 15:32   DG Ribs Unilateral W/Chest Left Result Date: 02/17/2024 CLINICAL DATA:  Pain after fall. EXAM: LEFT RIBS AND CHEST - 3+ VIEW COMPARISON:  Chest radiograph 10/09/2021 FINDINGS: The chest view is coned and excludes the right lateral aspect of the hemithorax. No acute left rib fracture. No evidence of rib lesion or bone destruction. Post treatment related changes at the right lung apex with chronic apical opacity. Right chest port remains in place. Left-sided pacemaker remains in place. The left lung is hyperinflated. No pneumothorax or pleural effusion. Stable heart size and mediastinal contours. IMPRESSION: 1. No acute left rib fracture or pulmonary complication related to fall. 2. Chronic and stable post treatment related changes at the right lung apex. Electronically Signed   By: Chadwick Colonel M.D.   On: 02/17/2024 15:31   DG Tibia/Fibula Left Result Date: 02/17/2024 CLINICAL DATA:  Pain after fall. EXAM: LEFT TIBIA AND FIBULA - 2 VIEW COMPARISON:  None Available. FINDINGS: There is no evidence of fracture or other focal bone lesions. Cortical margins of the tibia and fibula are intact. Knee and ankle alignment are maintained. Small surgical clips in the posteromedial soft tissues. IMPRESSION: Negative radiographs of the left tibia and fibula. Electronically Signed   By:  Chadwick Colonel M.D.   On: 02/17/2024 15:30       IMPRESSION/PLAN: 1. 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  The patient had an MRI on the 1.5 Tesla machine given his cardiac defibrillator. Dr. Humphrey Magnuson reviewed the imaging and recommends palliative radiation to the brain with whole brain radiation. He does not recommend radiation to the lung at this time  but suspects Dr. Marguerita Costa will recommend resuming systemic therapies. We discussed the risks, benefits, short, and long term effects of radiotherapy, as well as the curative intent, and the patient is interested in proceeding. Dr. Jeryl Moris discusses the delivery and logistics of radiotherapy and anticipates a course of 2 weeks of radiotherapy to the whole brain. We will ask his inpt team if he can transfer to [facility] for simulation tomorrow. 2. In Situ Cardiac Defibrillator.  We will coordinate with the patient's cardiology team as his brain would be greater than 10 cm away from his device.  In a visit lasting 60 minutes, greater than 50% of the time was spent by phone and in floor time discussing the patient's condition, in preparation for the discussion, and coordinating the patient's care.        Shelvia Dick, [facility]   **Disclaimer: This note was dictated with voice recognition software. Similar sounding words can inadvertently be transcribed and this note may contain transcription errors which may not have been corrected upon publication of note.**

## 2024-02-21 NOTE — Progress Notes (Signed)
       RE:  [name]     Date of Birth:  [birth year]   Date:   02/21/2024    To Whom It May Concern:  Please be advised that the above-named patient will require a short-term nursing home stay - anticipated 30 days or less for rehabilitation and strengthening.  The plan is for return home.

## 2024-02-21 NOTE — NC FL2 (Signed)
 [place]  MEDICAID FL2 LEVEL OF CARE FORM     IDENTIFICATION  Patient Name: [name] Birthdate: 04/08/1961 Sex: male Admission Date (Current Location): 02/17/2024  [facility] and [place] Number:  Producer, television/film/video and Address:  [facility], [address], [place], [place] [address]      Provider Number: [id number]  Attending Physician Name and Address:  Maylene Spear, MD  Relative Name and Phone Number:  Maxfield, Gildersleeve (Spouse)  [phone] (Mobile)    Current Level of Care: Hospital Recommended Level of Care: Skilled Nursing Facility Prior Approval Number:    Date Approved/Denied:   PASRR Number: pending  Discharge Plan: SNF    Current Diagnoses: Patient Active Problem List   Diagnosis Date Noted   Counseling regarding advance care planning and goals of care 02/19/2024   Imbalance 02/17/2024   Lung cancer metastatic to brain (HCC) 02/17/2024   HFrEF (heart failure with reduced ejection fraction) (HCC) 10/01/2021   S/P implantation of automatic cardioverter/defibrillator (AICD) 10/01/2021   CAD (coronary artery disease) 10/01/2021   Aortic atherosclerosis (HCC) 10/01/2021   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   Hypothyroidism 03/27/2021   Chronic hypotension 03/24/2021   Cardiac arrest (HCC) 03/24/2021   Recurrent non-small cell lung cancer (NSCLC) (HCC) 12/22/2020   Seizures (HCC) 06/30/2018   Seizure-like activity (HCC) 06/26/2018   Encounter for antineoplastic immunotherapy 06/12/2018   Port-A-Cath in place 04/24/2018   Non-small cell carcinoma of right lung, stage 3 (HCC) 03/22/2018   Goals of care, counseling/discussion 03/22/2018   Encounter for antineoplastic chemotherapy 03/22/2018   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/2018   Mass of upper lobe of right lung 03/08/2018   Ischemic cardiomyopathy 03/02/2018   Medication management 07/09/2016   Acute ST elevation myocardial infarction (STEMI) of lateral  wall (HCC) 05/26/2016   Acute ST elevation myocardial infarction (STEMI) involving left anterior descending (LAD) coronary artery (HCC) 05/26/2016   ST elevation myocardial infarction involving left anterior descending (LAD) coronary artery (HCC)    Tobacco abuse 01/28/2016   Tubular adenoma of colon 07/11/2014   Chronic hepatitis C without hepatic coma (HCC) 05/03/2014   Encounter for screening colonoscopy 05/03/2014   PAD (peripheral artery disease) (HCC) 01/25/2013   Peripheral vascular disease, unspecified 07/20/2012   Atherosclerosis of native artery of extremity with intermittent claudication (HCC) 12/16/2011   Acute hepatitis C virus infection 06/19/2007   SEBORRHEIC KERATOSIS 06/13/2007   BLOOD CHEMISTRY, ABNORMAL 06/13/2007   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   ANXIETY DISORDER, GENERALIZED 05/30/2007   LEG PAIN, RIGHT 11/26/2003    Orientation RESPIRATION BLADDER Height & Weight     Self, Situation, Time, Place  Normal Continent Weight: 191 lb 12.8 oz (87 kg) Height:  6' (182.9 cm)  BEHAVIORAL SYMPTOMS/MOOD NEUROLOGICAL BOWEL NUTRITION STATUS      Continent Diet (see d/c summary)  AMBULATORY STATUS COMMUNICATION OF NEEDS Skin   Extensive Assist Verbally PU Stage and Appropriate Care (pressure injury coccyx lower stage 2)                       Personal Care Assistance Level of Assistance  Bathing, Feeding, Dressing Bathing Assistance: Limited assistance Feeding assistance: Independent Dressing Assistance: Limited assistance     Functional Limitations Info  Sight, Hearing, Speech Sight Info: Adequate Hearing Info: Adequate Speech Info: Adequate    SPECIAL CARE FACTORS FREQUENCY  PT (By licensed PT), OT (By licensed OT)     PT Frequency: 5x/week OT Frequency: 5x/week  Contractures Contractures Info: Not present    Additional Factors Info  Code Status, Allergies Code Status Info: Full Allergies Info: Lyrica (pregabalin), Neurontin  (gabapentin)           Current Medications (02/21/2024):  This is the current hospital active medication list Current Facility-Administered Medications  Medication Dose Route Frequency Provider Last Rate Last Admin   acetaminophen  (TYLENOL ) tablet 650 mg  650 mg Oral Q6H PRN Krishnan, Gokul, MD       Or   acetaminophen  (TYLENOL ) suppository 650 mg  650 mg Rectal Q6H PRN Krishnan, Gokul, MD       ALPRAZolam  (XANAX ) tablet 2 mg  2 mg Oral TID PRN Krishnan, Gokul, MD   2 mg at 02/20/24 1723   amitriptyline  (ELAVIL ) tablet 100 mg  100 mg Oral QHS Chavez, Abigail, NP   100 mg at 02/20/24 2151   Chlorhexidine  Gluconate Cloth 2 % PADS 6 each  6 each Topical Daily Krishnan, Gokul, MD   6 each at 02/21/24 1004   citalopram  (CELEXA ) tablet 40 mg  40 mg Oral Daily Krishnan, Gokul, MD   40 mg at 02/21/24 3664   dexamethasone  (DECADRON ) tablet 4 mg  4 mg Oral Q8H Bettejane Brownie, PA-C   4 mg at 02/21/24 1432   feeding supplement (ENSURE ENLIVE / ENSURE PLUS) liquid 237 mL  237 mL Oral BID BM Krishnan, Gokul, MD   237 mL at 02/21/24 1432   HYDROcodone -acetaminophen  (NORCO) 10-325 MG per tablet 1 tablet  1 tablet Oral Q4H PRN Krishnan, Gokul, MD   1 tablet at 02/20/24 1125   leptospermum manuka honey (MEDIHONEY) paste 1 Application  1 Application Topical Daily Krishnan, Gokul, MD   1 Application at 02/21/24 0810   levETIRAcetam  (KEPPRA ) tablet 500 mg  500 mg Oral BID Krishnan, Gokul, MD   500 mg at 02/21/24 0809   levothyroxine  (SYNTHROID ) tablet 175 mcg  175 mcg Oral QAC breakfast Krishnan, Gokul, MD   175 mcg at 02/21/24 0618   liver oil-zinc oxide (DESITIN) 40 % ointment   Topical BID Krishnan, Gokul, MD   Given at 02/21/24 4034   magnesium  sulfate IVPB 4 g 100 mL  4 g Intravenous Once Krishnan, Gokul, MD 50 mL/hr at 02/21/24 1431 4 g at 02/21/24 1431   midodrine  (PROAMATINE ) tablet 10 mg  10 mg Oral TID WC Krishnan, Gokul, MD   10 mg at 02/21/24 1240   nicotine  (NICODERM CQ  - dosed in mg/24  hours) patch 14 mg  14 mg Transdermal Daily Krishnan, Gokul, MD   14 mg at 02/21/24 7425   ondansetron  (ZOFRAN ) tablet 4 mg  4 mg Oral Q6H PRN Krishnan, Gokul, MD       Or   ondansetron  (ZOFRAN ) injection 4 mg  4 mg Intravenous Q6H PRN Krishnan, Gokul, MD       polyethylene glycol (MIRALAX  / GLYCOLAX ) packet 17 g  17 g Oral Daily Krishnan, Gokul, MD       senna-docusate (Senokot-S) tablet 2 tablet  2 tablet Oral BID Krishnan, Gokul, MD   2 tablet at 02/21/24 0809   sodium chloride  flush (NS) 0.9 % injection 10-40 mL  10-40 mL Intracatheter Q12H Maylene Spear, MD   10 mL at 02/21/24 9563   sodium chloride  flush (NS) 0.9 % injection 10-40 mL  10-40 mL Intracatheter PRN Krishnan, Gokul, MD         Discharge Medications: Please see discharge summary for a list of discharge medications.  Relevant Imaging Results:  Relevant Lab Results:   Additional Information SSN [id number] [place], [place]

## 2024-02-21 NOTE — Progress Notes (Addendum)
 [facility] PROGRESS NOTE   [name] ZOX:[id number] DOB: 28-Oct-1960 DOA: 02/17/2024  PCP: [name], MD  Brief History: 63 y.o. male with a past medical history of non-small cell lung cancer, hypothyroidism, chronic systolic CHF, coronary artery disease with PCI to LAD in 2017, who was trying to go up the ramp at his house using a walker when he felt his left leg to give out and he as result fell.  He has a history of right below-knee amputation as well.  Patient experienced persistent pain in his left leg as a result of his fall and decided to come into the hospital for evaluation.  Evaluation in the ED raised concern for metastatic disease in his brain.  Cerebral edema was noted.  He was subsequently hospitalized.  He does not remember the last time he was treated for his cancer.  Based on EMR he has not been seen by Dr. Marguerita Shih since 2023.   Consultants: Palliative care.  Medical oncology  Procedures: None    Subjective/Interval History: Patient is awake alert.  Somewhat distracted.  Denies any complaints this morning including headache nausea vomiting.  Not sure about going to skilled nursing facility for rehab.     Assessment/Plan:  Non-small cell lung cancer now with metastases CT of the chest abdomen pelvis shows increase in the size of the right lung mass.  Other chronic changes were noted.  No evidence for metastatic process in the abdominal cavity. He does have brain lesions based on CT head which is new.  Vasogenic edema was noted.  Patient was started on dexamethasone .  No clear indication for antiepileptics at this time. MRI brain has been ordered however it could not be done over the weekend due to presence of defibrillator.  Hopefully will be done today. Patient has previously been seen by Dr. Marguerita Shih.  However has not been seen since 2023.   Seen by Dr. Salomon Cree.  Based on his note it appears that patient is interested in pursuing radiation if that is an  option.  This will be for both his lung lesion as well as brain lesion.  Discussed with Dr. Jeryl Moris with radiation oncology who will consult on the patient.   In the meantime continue with dexamethasone .   Started on Keppra  due to concern for seizure activity prior to admission.  EEG did not show any epileptiform activity.    Chronic systolic CHF/ICD in situ Last echocardiogram from 2023 showed LVEF of 30 to 35%.  No evidence for volume overload currently.   Echocardiogram done during this admission shows that the LVEF is about the same. Continue to monitor. Has a defibrillator in situ.  Hypotension Seems to be chronic since he is on midodrine  prior to admission.  This is being continued.  Blood pressure is stable.  He remains asymptomatic.  Coronary artery disease Has a stent to LAD which was placed in 2017.  Unclear if he takes any antiplatelet agents at home.  None listed on his home medication list.  Imbalance most likely due to brain lesion He fell as a result of his imbalance. Imbalance most likely due to brain metastases. Seen by physical therapy.  Home health is recommended. Patient does have right BKA and uses prosthesis at home.  Uses a walker to ambulate.   Hypothyroidism Continue levothyroxine .  Status post right BKA Uses a prosthesis at home.  PT and OT eval.  Stage III pressure injury coccyx Seen by wound care nurse.  DVT Prophylaxis: SCDs  for now Code Status: Full code Family Communication: Discussed with patient Disposition Plan: SNF recommended by PT however patient does not seem to be very keen on it.  Will need to be addressed when he is closer to discharge.      Medications: Scheduled:  amitriptyline   100 mg Oral QHS   Chlorhexidine  Gluconate Cloth  6 each Topical Daily   citalopram   40 mg Oral Daily   dexamethasone  (DECADRON ) injection  4 mg Intravenous Q6H   feeding supplement  237 mL Oral BID BM   leptospermum manuka honey  1 Application Topical  Daily   levETIRAcetam   500 mg Oral BID   levothyroxine   175 mcg Oral QAC breakfast   liver oil-zinc oxide   Topical BID   midodrine   10 mg Oral TID WC   nicotine   14 mg Transdermal Daily   polyethylene glycol  17 g Oral Daily   senna-docusate  2 tablet Oral BID   sodium chloride  flush  10-40 mL Intracatheter Q12H   Continuous: PRN:acetaminophen  **OR** acetaminophen , alprazolam , HYDROcodone -acetaminophen , ondansetron  **OR** ondansetron  (ZOFRAN ) IV, sodium chloride  flush    Objective:  Vital Signs  Vitals:   02/20/24 1544 02/20/24 2043 02/21/24 0558 02/21/24 0736  BP: (!) 103/59 96/79 97/66  105/64  Pulse: 96 82 83 77  Resp:  17 18 18   Temp: 98.1 F (36.7 C) 97.8 F (36.6 C) 98.5 F (36.9 C) 97.6 F (36.4 C)  TempSrc: Oral Oral Oral Oral  SpO2: 98% 98% 100% 98%  Weight:      Height:        Intake/Output Summary (Last 24 hours) at 02/21/2024 0921 Last data filed at 02/21/2024 0600 Gross per 24 hour  Intake 700 ml  Output 1200 ml  Net -500 ml   Filed Weights   02/17/24 1350  Weight: 87 kg    General appearance: Awake alert.  In no distress.  Mildly distracted Resp: Clear to auscultation bilaterally.  Normal effort Cardio: S1-S2 is normal regular.  No S3-S4.  No rubs murmurs or bruit GI: Abdomen is soft.  Nontender nondistended.  Bowel sounds are present normal.  No masses organomegaly Has a right BKA   Lab Results:  Data Reviewed: I have personally reviewed following labs and reports of the imaging studies  CBC: Recent Labs  Lab 02/17/24 1553 02/18/24 1114  WBC 13.1* 9.4  NEUTROABS 10.8*  --   HGB 12.2* 13.0  HCT 37.7* 40.1  MCV 84.9 83.7  PLT 416* 405*    Basic Metabolic Panel: Recent Labs  Lab 02/17/24 1553 02/18/24 1114 02/20/24 0500  NA 133* 132* 132*  K 3.6 4.2 4.0  CL 96* 95* 97*  CO2 27 25 27   GLUCOSE 92 114* 141*  BUN <5* 5* 15  CREATININE 0.65 0.71 0.68  CALCIUM  8.2* 8.8* 8.4*  MG  --   --  1.7    GFR: Estimated Creatinine  Clearance: 106.4 mL/min (by C-G formula based on SCr of 0.68 mg/dL).  Liver Function Tests: Recent Labs  Lab 02/17/24 1553 02/18/24 1114  AST 14* 15  ALT 8 10  ALKPHOS 82 84  BILITOT 0.6 0.9  PROT 5.7* 6.1*  ALBUMIN  2.8* 2.8*     Recent Results (from the past 240 hours)  Blood culture (routine x 2)     Status: None (Preliminary result)   Collection Time: 02/17/24  4:18 PM   Specimen: BLOOD  Result Value Ref Range Status   Specimen Description BLOOD LEFT ANTECUBITAL  Final  Special Requests   Final    BOTTLES DRAWN AEROBIC ONLY Blood Culture results may not be optimal due to an inadequate volume of blood received in culture bottles   Culture   Final    NO GROWTH 4 DAYS Performed at [facility], [address] [place], [place] [address]    Report Status PENDING  Incomplete  Blood culture (routine x 2)     Status: None (Preliminary result)   Collection Time: 02/17/24  4:18 PM   Specimen: BLOOD  Result Value Ref Range Status   Specimen Description BLOOD LEFT ANTECUBITAL  Final   Special Requests   Final    BOTTLES DRAWN AEROBIC ONLY Blood Culture results may not be optimal due to an inadequate volume of blood received in culture bottles   Culture   Final    NO GROWTH 4 DAYS Performed at [facility], [address], [place], [place] [address]    Report Status PENDING  Incomplete      Radiology Studies: ECHOCARDIOGRAM LIMITED Result Date: 02/20/2024    ECHOCARDIOGRAM LIMITED REPORT   Patient Name:   [name] Date of Exam: 02/20/2024 Medical Rec #:  [id number]   Height:       72.0 in Accession #:    [id number]  Weight:       191.8 lb Date of Birth:  1960-12-11   BSA:          2.093 m Patient Age:    63 years    BP:           94/68 mmHg Patient Gender: M           HR:           79 bpm. Exam Location:  Inpatient Procedure: Limited Echo (Both Spectral and Color Flow Doppler were utilized            during procedure). Indications:    Reattempt to see if LVEF can be  calculated  History:        Patient has prior history of Echocardiogram examinations, most                 recent 02/18/2024. CAD, COPD; Risk Factors:Current Smoker.  Sonographer:    Denese Finn RCS Referring Phys: [phone] [facility]  Sonographer Comments: Technically difficult study due to poor echo windows. IMPRESSIONS  1. Global hypokinesis with abnormal septal motion . Left ventricular ejection fraction, by estimation, is 30 to 35%. The left ventricle has moderately decreased function. The left ventricle demonstrates global hypokinesis. There is mild left ventricular  hypertrophy.  2. Device lead in RV. Right ventricular systolic function is normal. The right ventricular size is normal.  3. The inferior vena cava is normal in size with <50% respiratory variability, suggesting right atrial pressure of 8 mmHg. FINDINGS  Left Ventricle: Global hypokinesis with abnormal septal motion. Left ventricular ejection fraction, by estimation, is 30 to 35%. The left ventricle has moderately decreased function. The left ventricle demonstrates global hypokinesis. There is mild left  ventricular hypertrophy. Right Ventricle: Device lead in RV. The right ventricular size is normal. No increase in right ventricular wall thickness. Right ventricular systolic function is normal. Pericardium: There is no evidence of pericardial effusion. Venous: The inferior vena cava is normal in size with less than 50% respiratory variability, suggesting right atrial pressure of 8 mmHg. Additional Comments: 3D was performed not requiring image post processing on an independent workstation and was indeterminate. A device  lead is visualized.  LEFT VENTRICLE PLAX 2D LVIDd:         5.30 cm LVIDs:         4.80 cm LV PW:         1.00 cm LV IVS:        1.20 cm  LV Volumes (MOD) LV vol d, MOD A2C: 69.9 ml LV vol d, MOD A4C: 128.0 ml LV vol s, MOD A2C: 52.0 ml LV vol s, MOD A4C: 88.4 ml LV SV MOD A2C:     17.9 ml LV SV MOD A4C:     128.0 ml LV SV MOD BP:       32.2 ml LEFT ATRIUM         Index LA diam:    3.10 cm 1.48 cm/m   AORTA Ao Root diam: 3.40 cm Janelle Mediate MD Electronically signed by Janelle Mediate MD Signature Date/Time: 02/20/2024/1:23:07 PM    Final        LOS: 3 days   Maylene Spear  [facility] Pager on [email or web address]  02/21/2024, 9:21 AM

## 2024-02-21 NOTE — Progress Notes (Signed)
 Sacrum dressing changed, cleansed with NS, medhoney, dry gauze, foam dressing applied, and patient tolerated well. Bed in lowest position, call light within reach.

## 2024-02-21 NOTE — Care Management Important Message (Signed)
 Important Message  Patient Details  Name: [name] MRN: [id number] Date of Birth: 1961-07-18   Important Message Given:  Yes - Medicare IM     Felix Host 02/21/2024, 3:16 PM

## 2024-02-21 NOTE — TOC Progression Note (Signed)
 Transition of Care [facility]) - Progression Note    Patient Details  Name: [name] MRN: [id number] Date of Birth: May 28, 1961  Transition of [facility]) CM/SW Contact  Katrinka Parr, [place] Phone Number: 02/21/2024, 3:51 PM  Clinical Narrative:     Met with pt bedside to discuss SNF. Pt is agreeable to SNF w/u. CSW explained medicare coverage and insurance auth process. Fl2 completed and bed request sent in hub.   PASRR is pending  Expected Discharge Plan: SNF Barriers to Discharge: Continued Medical Work up  Expected Discharge Plan and Services   Discharge Planning Services: CM Consult Post Acute Care Choice: Home Health (outpatient palliative care) Living arrangements for the past 2 months: Single Family Home                 DME Arranged: N/A DME Agency: NA       HH Arranged: PT           Social Determinants of Health (SDOH) Interventions SDOH Screenings   Food Insecurity: No Food Insecurity (02/19/2024)  Housing: Low Risk  (02/19/2024)  Transportation Needs: No Transportation Needs (02/19/2024)  Utilities: Not At Risk (02/19/2024)  Tobacco Use: High Risk (02/17/2024)    Readmission Risk Interventions     No data to display

## 2024-02-21 NOTE — CV Procedure (Signed)
  Device system confirmed to be MRI conditional, with implant date > 6 weeks ago, and no evidence of abandoned or epicardial leads in review of most recent CXR  Device last cleared by EP Provider: Creighton Doffing 02/21/24 Clearance is good through for 1 year as long as parameters remain stable at time of check. If pt undergoes a cardiac device procedure during that time, they should be re-cleared.   Tachy-therapies to be programmed off if applicable with device back to pre-MRI settings after completion of exam.  [employer] - Programming recommendation received through [employer] App/Tablet  Arlys Lamer, RT  02/21/2024 10:34 AM

## 2024-02-22 ENCOUNTER — Ambulatory Visit: Admitting: Radiation Oncology

## 2024-02-22 ENCOUNTER — Ambulatory Visit
Admit: 2024-02-22 | Discharge: 2024-02-22 | Disposition: A | Discharge status: Home / Self Care | Attending: Radiation Oncology | Admitting: Radiation Oncology

## 2024-02-22 DIAGNOSIS — C7931 Secondary malignant neoplasm of brain: Secondary | ICD-10-CM | POA: Diagnosis not present

## 2024-02-22 DIAGNOSIS — C3490 Malignant neoplasm of unspecified part of unspecified bronchus or lung: Secondary | ICD-10-CM | POA: Diagnosis not present

## 2024-02-22 LAB — BASIC METABOLIC PANEL WITH GFR
Anion gap: 10 (ref 5–15)
BUN: 18 mg/dL (ref 8–23)
CO2: 26 mmol/L (ref 22–32)
Calcium: 8.5 mg/dL — ABNORMAL LOW (ref 8.9–10.3)
Chloride: 94 mmol/L — ABNORMAL LOW (ref 98–111)
Creatinine, Ser: 0.88 mg/dL (ref 0.61–1.24)
GFR, Estimated: >60 mL/min (ref >60)
Glucose, Bld: 145 mg/dL — ABNORMAL HIGH (ref 70–99)
Potassium: 4.3 mmol/L (ref 3.5–5.1)
Sodium: 130 mmol/L — ABNORMAL LOW (ref 135–145)

## 2024-02-22 LAB — CULTURE, BLOOD (ROUTINE X 2)
Culture: NO GROWTH
Culture: NO GROWTH

## 2024-02-22 LAB — MAGNESIUM: Magnesium: 2.5 mg/dL — ABNORMAL HIGH (ref 1.7–2.4)

## 2024-02-22 NOTE — Progress Notes (Signed)
 PROGRESS NOTE   [name]  ZOX:[id number]    DOB: 1961/05/06    DOA: 02/17/2024  PCP: [name], MD   I have briefly reviewed patients previous medical records in [facility].   Brief Hospital Course:   63 y.o. male with a past medical history of non-small cell lung cancer, hypothyroidism, chronic systolic CHF, coronary artery disease with PCI to LAD in 2017, who was trying to go up the ramp at his house using a walker when he felt his left leg to give out and he as result fell. He has a history of right below-knee amputation as well. Patient experienced persistent pain in his left leg as a result of his fall and decided to come into the hospital for evaluation. Evaluation in the ED raised concern for metastatic disease in his brain. Cerebral edema was noted. He was subsequently hospitalized. He does not remember the last time he was treated for his cancer. Based on EMR he has not been seen by Dr. Marguerita Shih since 2023.  MRI brain confirmed multiple brain mets.  Transferred from [facility] to [facility] for XRT.   Assessment & Plan:   Non-small cell lung cancer now with metastases/brain mets CT of the chest abdomen pelvis shows increase in the size of the right lung mass.  Other chronic changes were noted.  No evidence for metastatic process in the abdominal cavity. He does have brain lesions based on CT head which is new.  Vasogenic edema was noted.  Patient was started on dexamethasone .   MRI brain showed widespread brain metastasis (18 enhancing metastasis ranging from punctate up to 24 mm).  Multifocal vasogenic edema, most pronounced in the left cerebellum with some posterior for some mass effect but no ventriculomegaly, midline shift or other complicating features Patient has previously been seen by Dr. Marguerita Shih.  However has not been seen since 2023.   Started on Keppra  due to concern for seizure activity prior to admission.  EEG did not show any epileptiform activity.   Patient was transferred  from [facility] to [facility] for radiation oncology evaluation for whole brain RT.  It appears that he went down for planning today and starting XRT on 5/29.  No lung radiation planned. Medical oncology following and recommendations pending by Dr. Marguerita Shih.  Hyponatremia ?  SIADH.  Serum sodium stable in the low 130s.  It could also be due to meds/Celexa .   Chronic systolic CHF/ICD in situ Last echocardiogram from 2023 showed LVEF of 30 to 35%.  No evidence for volume overload currently.   Echocardiogram done during this admission shows that the LVEF is about the same. Continue to monitor. Has a defibrillator in situ.  Clinically euvolemic.   Hypotension Seems to be chronic since he is on midodrine  prior to admission.  This is being continued.  Blood pressure soft but stable.  He remains asymptomatic.   Coronary artery disease Has a stent to LAD which was placed in 2017.  Unclear if he takes any antiplatelet agents at home.  None listed on his home medication list.  No anginal symptoms.   Gait ataxia most likely due to brain lesion He fell as a result of his imbalance.  Right BKA complicating gait as well. Imbalance most likely due to brain metastases. Seen by physical therapy.  SNF recommended.  TOC consulted for same. Patient does have right BKA and uses prosthesis at home.  Uses a walker to ambulate.   Hypothyroidism Continue levothyroxine .   Status post right  BKA Uses a prosthesis at home.  SNF pending   Stage III pressure injury coccyx Seen by wound care nurse.  Tobacco abuse Cessation counseled.  Continue nicotine  patch.  Body mass index is 21.11 kg/m.   DVT prophylaxis: SCDs Start: 02/17/24 1754     Code Status: Full Code:  Family Communication: 2 male friends at bedside after patient consented to discuss care with them Disposition:  Status is: Inpatient Remains inpatient appropriate because: Start whole brain XRT on 5/29.  Awaiting SNF placement.     Consultants:   Medical  oncology Radiation oncology  Procedures:     Subjective:  No complaints reported.  Specifically no headache, nausea or vomiting or visual symptoms.  Asking when may probably be discharged.  Objective:   Vitals:   02/21/24 1608 02/21/24 1949 02/22/24 0451 02/22/24 0727  BP: 100/74 111/76 93/68 101/66  Pulse: 87 86 74 78  Resp: 18 18 18 20   Temp: 98.3 F (36.8 C) 98.2 F (36.8 C) 97.6 F (36.4 C) 97.7 F (36.5 C)  TempSrc: Oral Oral Oral Oral  SpO2: 97% 99% 96% 98%  Weight:  70.6 kg    Height:  6' (1.829 m)      General exam: Middle-age male, moderately built and frail lying comfortably propped up in bed without distress. Respiratory system: Clear to auscultation. Respiratory effort normal. Cardiovascular system: S1 & S2 heard, RRR. No JVD, murmurs, rubs, gallops or clicks. No pedal edema. Gastrointestinal system: Abdomen is nondistended, soft and nontender. No organomegaly or masses felt. Normal bowel sounds heard. Central nervous system: Alert and oriented. No focal neurological deficits. Extremities: Symmetric 5 x 5 power.  Healed right BKA. Skin: No rashes, lesions or ulcers Psychiatry: Judgement and insight appear normal. Mood & affect appropriate.     Data Reviewed:   I have personally reviewed following labs and imaging studies   CBC: Recent Labs  Lab 02/17/24 1553 02/18/24 1114  WBC 13.1* 9.4  NEUTROABS 10.8*  --   HGB 12.2* 13.0  HCT 37.7* 40.1  MCV 84.9 83.7  PLT 416* 405*    Basic Metabolic Panel: Recent Labs  Lab 02/17/24 1553 02/18/24 1114 02/20/24 0500 02/22/24 1000  NA 133* 132* 132* 130*  K 3.6 4.2 4.0 4.3  CL 96* 95* 97* 94*  CO2 27 25 27 26   GLUCOSE 92 114* 141* 145*  BUN <5* 5* 15 18  CREATININE 0.65 0.71 0.68 0.88  CALCIUM  8.2* 8.8* 8.4* 8.5*  MG  --   --  1.7 2.5*    Liver Function Tests: Recent Labs  Lab 02/17/24 1553 02/18/24 1114  AST 14* 15  ALT 8 10  ALKPHOS 82 84  BILITOT 0.6 0.9  PROT 5.7* 6.1*  ALBUMIN  2.8*  2.8*    CBG: Recent Labs  Lab 02/19/24 1746  GLUCAP 207*    Microbiology Studies:   Recent Results (from the past 240 hours)  Blood culture (routine x 2)     Status: None   Collection Time: 02/17/24  4:18 PM   Specimen: BLOOD  Result Value Ref Range Status   Specimen Description BLOOD LEFT ANTECUBITAL  Final   Special Requests   Final    BOTTLES DRAWN AEROBIC ONLY Blood Culture results may not be optimal due to an inadequate volume of blood received in culture bottles   Culture   Final    NO GROWTH 5 DAYS Performed at [facility], [address] [place], [place] [address]    Report  Status 02/22/2024 FINAL  Final  Blood culture (routine x 2)     Status: None   Collection Time: 02/17/24  4:18 PM   Specimen: BLOOD  Result Value Ref Range Status   Specimen Description BLOOD LEFT ANTECUBITAL  Final   Special Requests   Final    BOTTLES DRAWN AEROBIC ONLY Blood Culture results may not be optimal due to an inadequate volume of blood received in culture bottles   Culture   Final    NO GROWTH 5 DAYS Performed at [facility], [address] [place], [place] [address]    Report Status 02/22/2024 FINAL  Final    Radiology Studies:  MR Brain W and Wo Contrast Result Date: 02/21/2024 CLINICAL DATA:  63 year old male with recent fall. Cerebral vasogenic edema on CT suggesting metastatic disease and right upper lung masslike area. EXAM: MRI HEAD WITHOUT AND WITH CONTRAST TECHNIQUE: Multiplanar, multiecho pulse sequences of the brain and surrounding structures were obtained without and with intravenous contrast. CONTRAST:  8mL GADAVIST  GADOBUTROL  1 MMOL/ML IV SOLN COMPARISON:  Head CT 02/17/2024. Previous staging brain MRI 12/17/2020. FINDINGS: Brain: Multiple enhancing brain metastases. Eighteen separate enhancing metastases are identified ranging from punctate (series 16, image 122 left parietal lobe) up to 24 mm long axis (left cerebellar hemisphere series 16, image 48).  Associated vasogenic edema with T2 and FLAIR hyperintensity in the left occipital lobe, left cerebellum tracking to the midline, left posterior parietal lobe, anterior right frontal lobe. Of these, the left cerebellar mass defect is greatest with partial effacement of the 4th ventricle. But no ventriculomegaly. No midline shift. Basilar cisterns remain patent. No dural or leptomeningeal thickening identified. No superimposed restricted diffusion to suggest acute infarction. No extra-axial collection or acute intracranial hemorrhage. Some of the lesions have minimal associated hemosiderin on SWI. Cervicomedullary junction and pituitary are within normal limits. Vascular: Major intracranial vascular flow voids are stable since 2022, distal left vertebral artery appears dominant. Following contrast the major dural venous sinuses are enhancing and appear to be patent. Skull and upper cervical spine: Compared to 2022 visible bone marrow signal stable and within normal limits. Negative visible cervical spine and spinal cord. Sinuses/Orbits: Negative orbits. Chronic paranasal sinus retention cysts. Other: Mild chronic mastoid fluid. Visible internal auditory structures appear normal. Negative visible scalp and face. IMPRESSION: 1. Positive for widespread Brain metastases. Eighteen enhancing metastases suspected ranging from punctate (left parietal lobe) up to 24 mm (left cerebellum). 2. Multifocal vasogenic edema associated, most pronounced in the left cerebellum with some posterior fossa mass effect, but no ventriculomegaly, midline shift, or other complicating features at this time. Electronically Signed   By: Marlise Simpers M.D.   On: 02/21/2024 11:43    Scheduled Meds:    amitriptyline   100 mg Oral QHS   Chlorhexidine  Gluconate Cloth  6 each Topical Daily   citalopram   40 mg Oral Daily   dexamethasone   4 mg Oral Q8H   feeding supplement  237 mL Oral BID BM   leptospermum manuka honey  1 Application Topical Daily    levETIRAcetam   500 mg Oral BID   levothyroxine   175 mcg Oral QAC breakfast   liver oil-zinc oxide   Topical BID   midodrine   10 mg Oral TID WC   nicotine   14 mg Transdermal Daily   polyethylene glycol  17 g Oral Daily   senna-docusate  2 tablet Oral BID   sodium chloride  flush  10-40 mL Intracatheter Q12H    Continuous Infusions:  LOS: 4 days     Aubrey Blas, MD,  FACP, [facility], [facility], [facility]   [facility]      To contact the attending provider between 7A-7P or the covering provider during after hours 7P-7A, please log into the web site [email or web address] and access using universal [place] password for that web site. If you do not have the password, please call the hospital operator.  02/22/2024, 6:40 PM

## 2024-02-22 NOTE — Progress Notes (Signed)
 Occupational Therapy Treatment Patient Details Name: [name] MRN: [id number] DOB: 1961-06-05 Today's Date: 02/22/2024   History of present illness Pt is a 63 y.o. male who presented 02/17/24 with L leg pain s/p fall x1 day PTA. Pt was found to have lesions in the brain on CT head, concerning for metastases. Transferred to [facility] from [facility] for radiation oncology eval. PMH: R BKA, non-small cell lung cancer, hypothyroidism, chronic systolic CHF, CAD with PCI to LAD in 2017, hepatitis C, STEMI, AICD, asthma, COPD, DVT, PAD   OT comments  Pt seen for OT tx this date, pt pleasant and motivated to participate. Prosthesis to be brought by family member tomorrow. Performs bed mobility with supervision, sits EOB for oral care, and performs lateral scoots towards [facility] with CGA for safety. BP assessed sitting 82/69 (79), pt asymptomatic and RN made aware. Session ended due to pt receiving phone call from family members, returned to bed with needs within reach. Discharge recommendation appropriate, patient will benefit from continued inpatient follow up therapy, <3 hours/day       If plan is discharge home, recommend the following:  A little help with walking and/or transfers;A little help with bathing/dressing/bathroom;Direct supervision/assist for medications management;Help with stairs or ramp for entrance;Supervision due to cognitive status   Equipment Recommendations  BSC/3in1       Precautions / Restrictions Precautions Precautions: Fall;Other (comment) Recall of Precautions/Restrictions: Impaired Precaution/Restrictions Comments: has prosthesis (not currently present); watch BP Restrictions Weight Bearing Restrictions Per Provider Order: No       Mobility Bed Mobility Overal bed mobility: Needs Assistance Bed Mobility: Rolling, Sidelying to Sit Rolling: Supervision Sidelying to sit: Supervision       General bed mobility comments: recieved in side lying, able to easily transition to seated  with use of bed rail    Transfers Overall transfer level: Needs assistance Equipment used: None Transfers: Bed to chair/wheelchair/BSC            Lateral/Scoot Transfers: Contact guard assist General transfer comment: CGA for safety; able to perform 5x lateral scoots towards HOB     Balance Overall balance assessment: Needs assistance Sitting-balance support: No upper extremity supported, Feet supported Sitting balance-Leahy Scale: Good Sitting balance - Comments: sits EOB unsupported without LOB for oral care                                   ADL either performed or assessed with clinical judgement   ADL Overall ADL's : Needs assistance/impaired     Grooming: Set up;Sitting Grooming Details (indicate cue type and reason): sitting EOB                               General ADL Comments: Good flexibility and mobility, decreased balance. Motivated and willing to participate, no impulsivity noted this session.     Communication Communication Communication: No apparent difficulties   Cognition Arousal: Alert Behavior During Therapy: WFL for tasks assessed/performed Cognition: History of cognitive impairments             OT - Cognition Comments: Pt with poor STM but very pleasant, cooperative                 Following commands: Impaired Following commands impaired: Follows multi-step commands inconsistently      Cueing   Cueing Techniques: Verbal cues, Visual cues  General Comments BP assessed in sitting 82/69 (79), pt asymptomatic. Pt's spouse on phone end of session, agitated and demanding to speak with someone regarding transfer from [facility] to [facility]. RN and Water quality scientist notified. RN also notified of BP.     Pertinent Vitals/ Pain       Pain Assessment Pain Assessment: No/denies pain         Frequency  Min 2X/week        Progress Toward Goals  OT Goals(current goals can now be found in the care plan section)   Progress towards OT goals: Progressing toward goals  Acute Rehab OT Goals OT Goal Formulation: With patient/family Time For Goal Achievement: 03/05/24 Potential to Achieve Goals: Good ADL Goals Pt Will Perform Grooming: with supervision;standing Pt Will Perform Lower Body Dressing: with supervision;sit to/from stand Pt Will Transfer to Toilet: with supervision;ambulating;bedside commode Pt Will Perform Toileting - Clothing Manipulation and hygiene: with supervision;sit to/from stand  Plan         AM-PAC OT "6 Clicks" Daily Activity     Outcome Measure   Help from another person eating meals?: None Help from another person taking care of personal grooming?: A Little Help from another person toileting, which includes using toliet, bedpan, or urinal?: A Little Help from another person bathing (including washing, rinsing, drying)?: A Little Help from another person to put on and taking off regular upper body clothing?: A Little Help from another person to put on and taking off regular lower body clothing?: A Little 6 Click Score: 19    End of Session    OT Visit Diagnosis: Repeated falls (R29.6);Muscle weakness (generalized) (M62.81);History of falling (Z91.81)   Activity Tolerance Patient tolerated treatment well   Patient Left in bed;with call bell/phone within reach;with bed alarm set;with nursing/sitter in room   Nurse Communication Mobility status (low BP)        Time: 6440-3474 OT Time Calculation (min): 17 min  Charges: OT General Charges $OT Visit: 1 Visit OT Treatments $Self Care/Home Management : 8-22 mins  Ashtyn Freilich L. Daija Routson, OTR/L  02/22/24, 3:19 PM

## 2024-02-22 NOTE — Plan of Care (Signed)
  Problem: Education: Goal: Knowledge of General Education information will improve Description: Including pain rating scale, medication(s)/side effects and non-pharmacologic comfort measures Outcome: Progressing   Problem: Clinical Measurements: Goal: Respiratory complications will improve Outcome: Progressing Goal: Cardiovascular complication will be avoided Outcome: Progressing   Problem: Nutrition: Goal: Adequate nutrition will be maintained Outcome: Progressing   Problem: Coping: Goal: Level of anxiety will decrease Outcome: Progressing   Problem: Elimination: Goal: Will not experience complications related to bowel motility Outcome: Progressing   Problem: Pain Managment: Goal: General experience of comfort will improve and/or be controlled Outcome: Progressing

## 2024-02-22 NOTE — Plan of Care (Signed)

## 2024-02-22 NOTE — Progress Notes (Addendum)
 [name]   DOB:10/26/1960   NW#:[id number]      ASSESSMENT & PLAN:  [name] is a 63 year old male patient with oncologic history significant for non small cell lung cancer.  Of note patient has been lost to follow-up since 2023.  Patient is now admitted after fall due to imbalance.  Found to have widespread brain mets.  Transferred to [facility] from [facility] for radiation oncology eval.  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  Subsequent disease progression June 2021. - Status post concurrent chemoradiation with carbo and paclitaxel  x 6 cycles.  Subsequently received immunotherapy with Imfinzi  x 26 cycles.  Also received immunotherapy with Keytruda .  Patient was last given carbo and Alimta  prior to being lost to follow-up. - Patient last seen in outpatient oncology office in November 2023.  Patient reports he was lost to follow-up because he "gave up". -MRI brain shows widespread brain mets with 18 enhancing metastases.  Also shows multi focal vasogenic edema most pronounced in the left cerebellum. - Pending radiation oncology eval for WBRT - Medical oncology/Dr. Liam Redhead following and will make further evaluation and treatment recommendations.  Hypertension Chronic systolic CHF/CAD Imbalance-status post fall - Status post right BKA, uses prosthesis and ambulates with walker - Imbalance likely due to brain mets - Monitor blood pressure levels - Continue supportive care   Code Status Full  Subjective:  Patient seen awake and alert laying supine in bed.  Patient denies current pain.  States he does not want to know why I am here".  When questioned about reason for being lost to oncology following up he responded "I just gave up".  No acute distress is noted at this time.   Objective:   Intake/Output Summary (Last 24 hours) at 02/22/2024 1029 Last data filed at 02/22/2024 0300 Gross per 24 hour  Intake 236 ml   Output 1700 ml  Net -1464 ml     PHYSICAL EXAMINATION: ECOG PERFORMANCE STATUS: 3 - Symptomatic, >50% confined to bed  Vitals:   02/22/24 0451 02/22/24 0727  BP: 93/68 101/66  Pulse: 74 78  Resp: 18 20  Temp: 97.6 F (36.4 C) 97.7 F (36.5 C)  SpO2: 96% 98%   Filed Weights   02/17/24 1350 02/21/24 1949  Weight: 191 lb 12.8 oz (87 kg) 155 lb 10.3 oz (70.6 kg)    GENERAL: alert, no distress and comfortable SKIN: skin color, texture, turgor are normal, no rashes or significant lesions EYES: normal, conjunctiva are pink and non-injected, sclera clear OROPHARYNX: no exudate, no erythema and lips, buccal mucosa, and tongue normal  NECK: supple, thyroid  normal size, non-tender, without nodularity LYMPH: no palpable lymphadenopathy in the cervical, axillary or inguinal LUNGS: clear to auscultation and percussion with normal breathing effort HEART: regular rate & rhythm and no murmurs and no lower extremity edema ABDOMEN: abdomen soft, non-tender and normal bowel sounds MUSCULOSKELETAL: + Right BKA PSYCH: alert & oriented x 3 with fluent speech NEURO: no focal motor/sensory deficits   All questions were answered. The patient knows to call the clinic with any problems, questions or concerns.   The total time spent in the appointment was 40 minutes encounter with patient including review of chart and various tests results, discussions about plan of care and coordination of care plan  Mark Mate, NP 02/22/2024 10:29 AM    Labs Reviewed:  Lab Results  Component Value Date   WBC 9.4 02/18/2024  HGB 13.0 02/18/2024   HCT 40.1 02/18/2024   MCV 83.7 02/18/2024   PLT 405 (H) 02/18/2024   Recent Labs    07/08/23 0005 02/17/24 1553 02/18/24 1114 02/20/24 0500  NA 131* 133* 132* 132*  K 3.2* 3.6 4.2 4.0  CL 91* 96* 95* 97*  CO2 27 27 25 27   GLUCOSE 104* 92 114* 141*  BUN 8 <5* 5* 15  CREATININE 0.90 0.65 0.71 0.68  CALCIUM  8.3* 8.2* 8.8* 8.4*  GFRNONAA >60 >60 >60  >60  PROT 5.7* 5.7* 6.1*  --   ALBUMIN  3.1* 2.8* 2.8*  --   AST 18 14* 15  --   ALT 16 8 10   --   ALKPHOS 70 82 84  --   BILITOT 0.5 0.6 0.9  --     Studies Reviewed:  MR Brain W and Wo Contrast Result Date: 02/21/2024 CLINICAL DATA:  63 year old male with recent fall. Cerebral vasogenic edema on CT suggesting metastatic disease and right upper lung masslike area. EXAM: MRI HEAD WITHOUT AND WITH CONTRAST TECHNIQUE: Multiplanar, multiecho pulse sequences of the brain and surrounding structures were obtained without and with intravenous contrast. CONTRAST:  8mL GADAVIST  GADOBUTROL  1 MMOL/ML IV SOLN COMPARISON:  Head CT 02/17/2024. Previous staging brain MRI 12/17/2020. FINDINGS: Brain: Multiple enhancing brain metastases. Eighteen separate enhancing metastases are identified ranging from punctate (series 16, image 122 left parietal lobe) up to 24 mm long axis (left cerebellar hemisphere series 16, image 48). Associated vasogenic edema with T2 and FLAIR hyperintensity in the left occipital lobe, left cerebellum tracking to the midline, left posterior parietal lobe, anterior right frontal lobe. Of these, the left cerebellar mass defect is greatest with partial effacement of the 4th ventricle. But no ventriculomegaly. No midline shift. Basilar cisterns remain patent. No dural or leptomeningeal thickening identified. No superimposed restricted diffusion to suggest acute infarction. No extra-axial collection or acute intracranial hemorrhage. Some of the lesions have minimal associated hemosiderin on SWI. Cervicomedullary junction and pituitary are within normal limits. Vascular: Major intracranial vascular flow voids are stable since 2022, distal left vertebral artery appears dominant. Following contrast the major dural venous sinuses are enhancing and appear to be patent. Skull and upper cervical spine: Compared to 2022 visible bone marrow signal stable and within normal limits. Negative visible cervical spine  and spinal cord. Sinuses/Orbits: Negative orbits. Chronic paranasal sinus retention cysts. Other: Mild chronic mastoid fluid. Visible internal auditory structures appear normal. Negative visible scalp and face. IMPRESSION: 1. Positive for widespread Brain metastases. Eighteen enhancing metastases suspected ranging from punctate (left parietal lobe) up to 24 mm (left cerebellum). 2. Multifocal vasogenic edema associated, most pronounced in the left cerebellum with some posterior fossa mass effect, but no ventriculomegaly, midline shift, or other complicating features at this time. Electronically Signed   By: Marlise Simpers M.D.   On: 02/21/2024 11:43   ECHOCARDIOGRAM LIMITED Result Date: 02/20/2024    ECHOCARDIOGRAM LIMITED REPORT   Patient Name:   JABER DUNLOW Date of Exam: 02/20/2024 Medical Rec #:  [id number]   Height:       72.0 in Accession #:    [id number]  Weight:       191.8 lb Date of Birth:  08/18/1961   BSA:          2.093 m Patient Age:    63 years    BP:           94/68 mmHg Patient Gender: M  HR:           79 bpm. Exam Location:  Inpatient Procedure: Limited Echo (Both Spectral and Color Flow Doppler were utilized            during procedure). Indications:    Reattempt to see if LVEF can be calculated  History:        Patient has prior history of Echocardiogram examinations, most                 recent 02/18/2024. CAD, COPD; Risk Factors:Current Smoker.  Sonographer:    Denese Finn RCS Referring Phys: [phone] [facility]  Sonographer Comments: Technically difficult study due to poor echo windows. IMPRESSIONS  1. Global hypokinesis with abnormal septal motion . Left ventricular ejection fraction, by estimation, is 30 to 35%. The left ventricle has moderately decreased function. The left ventricle demonstrates global hypokinesis. There is mild left ventricular  hypertrophy.  2. Device lead in RV. Right ventricular systolic function is normal. The right ventricular size is normal.  3. The inferior vena  cava is normal in size with <50% respiratory variability, suggesting right atrial pressure of 8 mmHg. FINDINGS  Left Ventricle: Global hypokinesis with abnormal septal motion. Left ventricular ejection fraction, by estimation, is 30 to 35%. The left ventricle has moderately decreased function. The left ventricle demonstrates global hypokinesis. There is mild left  ventricular hypertrophy. Right Ventricle: Device lead in RV. The right ventricular size is normal. No increase in right ventricular wall thickness. Right ventricular systolic function is normal. Pericardium: There is no evidence of pericardial effusion. Venous: The inferior vena cava is normal in size with less than 50% respiratory variability, suggesting right atrial pressure of 8 mmHg. Additional Comments: 3D was performed not requiring image post processing on an independent workstation and was indeterminate. A device lead is visualized.  LEFT VENTRICLE PLAX 2D LVIDd:         5.30 cm LVIDs:         4.80 cm LV PW:         1.00 cm LV IVS:        1.20 cm  LV Volumes (MOD) LV vol d, MOD A2C: 69.9 ml LV vol d, MOD A4C: 128.0 ml LV vol s, MOD A2C: 52.0 ml LV vol s, MOD A4C: 88.4 ml LV SV MOD A2C:     17.9 ml LV SV MOD A4C:     128.0 ml LV SV MOD BP:      32.2 ml LEFT ATRIUM         Index LA diam:    3.10 cm 1.48 cm/m   AORTA Ao Root diam: 3.40 cm Janelle Mediate MD Electronically signed by Janelle Mediate MD Signature Date/Time: 02/20/2024/1:23:07 PM    Final    ECHOCARDIOGRAM COMPLETE Result Date: 02/18/2024    ECHOCARDIOGRAM REPORT   Patient Name:   ENGLISH CRAIGHEAD Date of Exam: 02/18/2024 Medical Rec #:  [id number]   Height:       72.0 in Accession #:    [id number]  Weight:       191.8 lb Date of Birth:  01-09-1961   BSA:          2.093 m Patient Age:    63 years    BP:           90/61 mmHg Patient Gender: M           HR:           90 bpm. Exam Location:  Inpatient Procedure: 2D Echo, Cardiac Doppler and Color Doppler (Both Spectral and Color            Flow  Doppler were utilized during procedure). Indications:    CHF-Acute Systolic I50.21  History:        Patient has prior history of Echocardiogram examinations, most                 recent 10/08/2021. Cardiomyopathy, Acute MI and CAD, COPD,                 Signs/Symptoms:Hypotension; Risk Factors:Current Smoker.  Sonographer:    Terrilee Few RCS Referring Phys: [id number] Maylene Spear IMPRESSIONS  1. Limited study due to poor acoustic windows. LV function cannot be assessed.  2. Right ventricular systolic function is normal. The right ventricular size is normal.  3. The aortic valve is grossly normal. Aortic valve regurgitation is not visualized.  4. The inferior vena cava is normal in size with greater than 50% respiratory variability, suggesting right atrial pressure of 3 mmHg. FINDINGS  Left Ventricle: Right Ventricle: The right ventricular size is normal. Right vetricular wall thickness was not well visualized. Right ventricular systolic function is normal. Left Atrium: Left atrial size was normal in size. Tricuspid Valve: The tricuspid valve is grossly normal. Tricuspid valve regurgitation is trivial. Aortic Valve: The aortic valve is grossly normal. Aortic valve regurgitation is not visualized. Pulmonic Valve: The pulmonic valve was grossly normal. Pulmonic valve regurgitation is trivial. Venous: The inferior vena cava is normal in size with greater than 50% respiratory variability, suggesting right atrial pressure of 3 mmHg.  IVC IVC diam: 1.70 cm Grady Lawman MD Electronically signed by Grady Lawman MD Signature Date/Time: 02/18/2024/1:45:44 PM    Final    EEG adult Result Date: 02/18/2024 Eleni Griffin, MD     02/19/2024 12:41 PM Routine EEG Report JAYREN CEASE is a 63 y.o. male with a history of altered mental status and brain tumor who is undergoing an EEG to evaluate for seizures. Report: This EEG was acquired with electrodes placed according to the International 10-20 electrode system (including  Fp1, Fp2, F3, F4, C3, C4, P3, P4, O1, O2, T3, T4, T5, T6, A1, A2, Fz, Cz, Pz). The following electrodes were missing or displaced: none. The occipital dominant rhythm was 11 Hz. This activity is reactive to stimulation. Drowsiness was manifested by background fragmentation; deeper stages of sleep were not identified. There was focal slowing over the left temporal region. There were no interictal epileptiform discharges. There were no electrographic seizures identified. Photic stimulation and hyperventilation were not performed. Impression and clinical correlation: This EEG was obtained while awake and drowsy and is abnormal due to focal slowing over the left posterior region (indicating focal cerebral dysfunction). That location corresponds to the area where CT scan showed the most significant abnormality. No epileptiform abnormalities were seen during this recording. Greg Leaks, MD [facility] [phone] If 7pm- 7am, please page neurology on call as listed in AMION.   VAS US  LOWER EXTREMITY VENOUS (DVT) (7a-7p) Result Date: 02/18/2024  Lower Venous DVT Study Patient Name:  JERRARD BRADBURN  Date of Exam:   02/17/2024 Medical Rec #: [id number]    Accession #:    [id number] Date of Birth: 02-05-1961    Patient Gender: M Patient Age:   25 years Exam Location:  [facility] Procedure:      VAS US  LOWER EXTREMITY VENOUS (DVT) Referring Phys: Aleatha Hunting --------------------------------------------------------------------------------  Indications: Edema.  Comparison  Study: No prior studies. Performing Technologist: Lerry Ransom RVT  Examination Guidelines: A complete evaluation includes B-mode imaging, spectral Doppler, color Doppler, and power Doppler as needed of all accessible portions of each vessel. Bilateral testing is considered an integral part of a complete examination. Limited examinations for reoccurring indications may be performed as noted. The reflux portion of the exam is performed  with the patient in reverse Trendelenburg.  +-----+---------------+---------+-----------+----------+--------------+ RIGHTCompressibilityPhasicitySpontaneityPropertiesThrombus Aging +-----+---------------+---------+-----------+----------+--------------+ CFV  Full           Yes      Yes                                 +-----+---------------+---------+-----------+----------+--------------+   +---------+---------------+---------+-----------+----------+--------------+ LEFT     CompressibilityPhasicitySpontaneityPropertiesThrombus Aging +---------+---------------+---------+-----------+----------+--------------+ CFV      Full           Yes      Yes                                 +---------+---------------+---------+-----------+----------+--------------+ SFJ      Full                                                        +---------+---------------+---------+-----------+----------+--------------+ FV Prox  Full                                                        +---------+---------------+---------+-----------+----------+--------------+ FV Mid   Full                                                        +---------+---------------+---------+-----------+----------+--------------+ FV DistalFull                                                        +---------+---------------+---------+-----------+----------+--------------+ PFV      Full                                                        +---------+---------------+---------+-----------+----------+--------------+ POP      Full           Yes      Yes                                 +---------+---------------+---------+-----------+----------+--------------+ PTV      Full                                                        +---------+---------------+---------+-----------+----------+--------------+  PERO     Full                                                         +---------+---------------+---------+-----------+----------+--------------+     Summary: RIGHT: - No evidence of common femoral vein obstruction.   LEFT: - There is no evidence of deep vein thrombosis in the lower extremity.  - No cystic structure found in the popliteal fossa.  *See table(s) above for measurements and observations. Electronically signed by Irvin Mantel on 02/18/2024 at 10:21:28 AM.    Final    CT CHEST ABDOMEN PELVIS W CONTRAST Result Date: 02/17/2024 CLINICAL DATA:  Metastatic disease evaluation EXAM: CT CHEST, ABDOMEN, AND PELVIS WITH CONTRAST TECHNIQUE: Multidetector CT imaging of the chest, abdomen and pelvis was performed following the standard protocol during bolus administration of intravenous contrast. RADIATION DOSE REDUCTION: This exam was performed according to the departmental dose-optimization program which includes automated exposure control, adjustment of the mA and/or kV according to patient size and/or use of iterative reconstruction technique. CONTRAST:  75mL OMNIPAQUE  IOHEXOL  350 MG/ML SOLN COMPARISON:  Chest radiographs 02/17/2024; CTA chest 07/08/2023; CT abdomen pelvis 07/26/2022 FINDINGS: CT CHEST FINDINGS Cardiovascular: No pericardial effusion. Normal heart size. Aortic and coronary artery atherosclerotic calcification. Left chest wall pacemaker. Right chest wall Port-A-Cath tip in the right atrium. Mediastinum/Nodes: 9 mm anterior pericardial node on series 3/image 41. 1.1 cm right paratracheal node on series 3/image 17. 1.4 cm subcarinal node on series 3/image 23. These have increased compared to 07/26/2022 when they were normal in size. Soft tissue thickening along the right mainstem bronchus and right upper and middle lobe bronchi. Unremarkable esophagus. Lungs/Pleura: Chronic post treatment change in the right apex. Increased right upper lobe collapse and consolidation. Chronic gas and fluid collection in the right apex with air-fluid level. This appears to  communicate with the tracheal bronchial tree (series 3/image 19). Masslike consolidation in the right upper lobe has increased compared to 07/08/2023 now measuring 6.3 x 5.2 cm, previously 2.7 x 1.8 cm. Increased size and number of multiple pulmonary nodules bilaterally. For example 2.1 cm nodule in the posteromedial right lower lobe on series 5/image 82 previously measured 0.9 cm. 1.6 cm nodule in the left upper lobe on series 5/image 51 previously measured 0.9 cm. 1.7 cm nodule in the posterior left lower lobe on series 5/image 117 is new. Trace left pleural effusion. Emphysema. Musculoskeletal: No acute fracture or destructive osseous lesion. CT ABDOMEN PELVIS FINDINGS Hepatobiliary: Cholecystectomy. No focal hepatic lesion. No biliary dilation. Pancreas: Unremarkable. Spleen: Unremarkable. Adrenals/Urinary Tract: Normal adrenal glands. No urinary calculi or hydronephrosis. Unremarkable bladder. Stomach/Bowel: Normal caliber large and small bowel. Stomach is within normal limits. No bowel wall thickening. Vascular/Lymphatic: Aortic atherosclerosis. No enlarged abdominal or pelvic lymph nodes. Reproductive: No acute abnormality. Other: No free intraperitoneal fluid or air. Musculoskeletal: No acute fracture or destructive osseous lesion. IMPRESSION: 1. Increased masslike consolidation in the right upper lobe concerning for progression of disease/occurrence. 2. Worsening pulmonary and nodal metastases. 3. Chronic gas and fluid collection in the right apex with air-fluid level and bronchopleural fistula. 4. No evidence of metastatic disease in the abdomen or pelvis. Aortic Atherosclerosis (ICD10-I70.0) and Emphysema (ICD10-J43.9). Electronically Signed   By: Rozell Cornet M.D.   On: 02/17/2024 21:49   CT Head Wo Contrast Result Date: 02/17/2024  CLINICAL DATA:  Trauma, fall. EXAM: CT HEAD WITHOUT CONTRAST CT CERVICAL SPINE WITHOUT CONTRAST TECHNIQUE: Multidetector CT imaging of the head and cervical spine was  performed following the standard protocol without intravenous contrast. Multiplanar CT image reconstructions of the cervical spine were also generated. RADIATION DOSE REDUCTION: This exam was performed according to the departmental dose-optimization program which includes automated exposure control, adjustment of the mA and/or kV according to patient size and/or use of iterative reconstruction technique. COMPARISON:  CT head 03/28/2021. FINDINGS: CT HEAD FINDINGS Brain: No acute intracranial hemorrhage. There is vasogenic edema within the left occipital lobe extending into the posterior left temporal lobe. Suggestion of a heterogeneous 1.2 cm masslike lesion within this region. Additional edema within the left cerebellar hemisphere with an associated 1.4 cm lesion. Edema extends into the cerebellar vermis with associated mass effect and partial effacement of the fourth ventricle. There is also partial effacement of the quadrigeminal cistern more pronounced on the left. Additional foci of vasogenic edema in the left parietal lobe and anterior right frontal lobe. Partial effacement of the left occipital and temporal horns. No hydrocephalus. No midline shift. Vascular: No hyperdense vessel or unexpected calcification. Skull: Normal. Negative for fracture or focal lesion. Sinuses/Orbits: No acute finding. Orbits are symmetric. Mucosal thickening and mucous retention cysts in the maxillary sinuses. Other: None. CT CERVICAL SPINE FINDINGS Alignment: Cervical lordosis is maintained. Trace stepwise retrolisthesis of C4 on C5, C5 on C6, and C6 on C7. No facet subluxation or dislocation. Skull base and vertebrae: No compression fracture or displaced fracture in the cervical spine. Soft tissues and spinal canal: No prevertebral fluid or swelling. No visible canal hematoma. Disc levels: Mild disc space narrowing at multiple levels. There are small disc bulges throughout the cervical spine. Mild spinal canal stenosis at C4-5.  Facet arthrosis at multiple levels most pronounced on the left at C3-4. No high-grade osseous foraminal narrowing. Upper chest: Complete opacification of the partially visualized right lung apex. Right-sided central venous catheter. Emphysema in the left lung apex. Other: None. IMPRESSION: Vasogenic edema in the left occipital lobe and left cerebellum with associated mass lesions. Additional vasogenic edema in the left parietal lobe and anterior right frontal lobe. Findings concerning for metastatic disease. Recommend contrast enhanced MRI brain for further evaluation. Edema in the cerebellum results in partial effacement of the fourth ventricle and quadrigeminal cistern. No hydrocephalus. No acute intracranial hemorrhage. No acute fracture or traumatic malalignment of the cervical spine. Complete opacification of the right lung apex. Emphysema in the left lung apex. Electronically Signed   By: Denny Flack M.D.   On: 02/17/2024 15:42   CT Cervical Spine Wo Contrast Result Date: 02/17/2024 CLINICAL DATA:  Trauma, fall. EXAM: CT HEAD WITHOUT CONTRAST CT CERVICAL SPINE WITHOUT CONTRAST TECHNIQUE: Multidetector CT imaging of the head and cervical spine was performed following the standard protocol without intravenous contrast. Multiplanar CT image reconstructions of the cervical spine were also generated. RADIATION DOSE REDUCTION: This exam was performed according to the departmental dose-optimization program which includes automated exposure control, adjustment of the mA and/or kV according to patient size and/or use of iterative reconstruction technique. COMPARISON:  CT head 03/28/2021. FINDINGS: CT HEAD FINDINGS Brain: No acute intracranial hemorrhage. There is vasogenic edema within the left occipital lobe extending into the posterior left temporal lobe. Suggestion of a heterogeneous 1.2 cm masslike lesion within this region. Additional edema within the left cerebellar hemisphere with an associated 1.4 cm  lesion. Edema extends into the cerebellar vermis with associated  mass effect and partial effacement of the fourth ventricle. There is also partial effacement of the quadrigeminal cistern more pronounced on the left. Additional foci of vasogenic edema in the left parietal lobe and anterior right frontal lobe. Partial effacement of the left occipital and temporal horns. No hydrocephalus. No midline shift. Vascular: No hyperdense vessel or unexpected calcification. Skull: Normal. Negative for fracture or focal lesion. Sinuses/Orbits: No acute finding. Orbits are symmetric. Mucosal thickening and mucous retention cysts in the maxillary sinuses. Other: None. CT CERVICAL SPINE FINDINGS Alignment: Cervical lordosis is maintained. Trace stepwise retrolisthesis of C4 on C5, C5 on C6, and C6 on C7. No facet subluxation or dislocation. Skull base and vertebrae: No compression fracture or displaced fracture in the cervical spine. Soft tissues and spinal canal: No prevertebral fluid or swelling. No visible canal hematoma. Disc levels: Mild disc space narrowing at multiple levels. There are small disc bulges throughout the cervical spine. Mild spinal canal stenosis at C4-5. Facet arthrosis at multiple levels most pronounced on the left at C3-4. No high-grade osseous foraminal narrowing. Upper chest: Complete opacification of the partially visualized right lung apex. Right-sided central venous catheter. Emphysema in the left lung apex. Other: None. IMPRESSION: Vasogenic edema in the left occipital lobe and left cerebellum with associated mass lesions. Additional vasogenic edema in the left parietal lobe and anterior right frontal lobe. Findings concerning for metastatic disease. Recommend contrast enhanced MRI brain for further evaluation. Edema in the cerebellum results in partial effacement of the fourth ventricle and quadrigeminal cistern. No hydrocephalus. No acute intracranial hemorrhage. No acute fracture or traumatic  malalignment of the cervical spine. Complete opacification of the right lung apex. Emphysema in the left lung apex. Electronically Signed   By: Denny Flack M.D.   On: 02/17/2024 15:42   DG Foot Complete Left Result Date: 02/17/2024 CLINICAL DATA:  Pain after fall. EXAM: LEFT FOOT - COMPLETE 3+ VIEW COMPARISON:  07/07/2023 FINDINGS: There is no evidence of fracture or dislocation. Again seen degenerative changes of the first metatarsal phalangeal joint and dorsal midfoot. Soft tissues are unremarkable. IMPRESSION: No fracture or dislocation of the left foot. Electronically Signed   By: Chadwick Colonel M.D.   On: 02/17/2024 15:32   DG Ribs Unilateral W/Chest Left Result Date: 02/17/2024 CLINICAL DATA:  Pain after fall. EXAM: LEFT RIBS AND CHEST - 3+ VIEW COMPARISON:  Chest radiograph 10/09/2021 FINDINGS: The chest view is coned and excludes the right lateral aspect of the hemithorax. No acute left rib fracture. No evidence of rib lesion or bone destruction. Post treatment related changes at the right lung apex with chronic apical opacity. Right chest port remains in place. Left-sided pacemaker remains in place. The left lung is hyperinflated. No pneumothorax or pleural effusion. Stable heart size and mediastinal contours. IMPRESSION: 1. No acute left rib fracture or pulmonary complication related to fall. 2. Chronic and stable post treatment related changes at the right lung apex. Electronically Signed   By: Chadwick Colonel M.D.   On: 02/17/2024 15:31   DG Tibia/Fibula Left Result Date: 02/17/2024 CLINICAL DATA:  Pain after fall. EXAM: LEFT TIBIA AND FIBULA - 2 VIEW COMPARISON:  None Available. FINDINGS: There is no evidence of fracture or other focal bone lesions. Cortical margins of the tibia and fibula are intact. Knee and ankle alignment are maintained. Small surgical clips in the posteromedial soft tissues. IMPRESSION: Negative radiographs of the left tibia and fibula. Electronically Signed   By:  Chadwick Colonel M.D.   On:  02/17/2024 15:30   ADDENDUM: Hematology/Oncology Attending: The patient is seen and examined today.  His daughter, granddaughter as well as a friend were at the bedside.  I reviewed his records, recent scan and recommended his care plan and I agree with the above note.  This is a very pleasant 63 years old white male with metastatic non-small cell lung cancer, adenosquamous carcinoma that was initially diagnosed in June 2019 as a stage IIIa.  He has no actionable mutation and PD-L1 expression was 99%.  The patient started treatment initially was concurrent chemoradiation followed by 1 year treatment with consolidation immunotherapy with Imfinzi .  He had evidence for disease progression and he started on treatment with single agent Keytruda  on February 27, 2020 for 12 cycle that was discontinued secondary to disease progression.  He is then started first-line systemic chemotherapy with carboplatin  and Alimta  every 3 weeks for 5 cycles followed by maintenance treatment with Alimta  starting from cycle #5. He has been doing well until November 2023 when he was lost to follow-up and travel to [place]  to be closer to his parents who had some health issues at that time.  He spent more than 7 months in [place]  before coming back to [place]  but he did not come to the office for further evaluation or management.  He presented to the emergency department at [facility] on 02/17/2024 with gait imbalance and falls.  He had imaging studies including CT scan of the neck, cervical spine as well as CT scan of the chest, abdomen and pelvis and MRI of the brain.  The MRI of the brain showed positive widespread brain metastasis with 18 enhancing metastasis suspected ranging from punctate up to 2.4 cm in the left cerebellum.  There was multifocal vasogenic edema associated most pronounced in the left cerebellum with some posterior fossa mass effect. CT scan of the chest, abdomen and pelvis showed  increasing mass consolidation in the right upper lobe concerning for progression of disease in addition to worsening pulmonary and nodal metastasis.  There was no evidence of metastatic disease in the abdomen or pelvis. The patient is started on Decadron  and he was seen by radiation oncology for consideration of whole brain irradiation. I had a lengthy discussion with the patient and his daughter and the rest of the family today about his current condition and treatment options. I strongly recommend for the patient to proceed with the whole brain radiation as recommended by radiation oncology. Regarding his systemic disease.  He has evidence of systemic disease progression with progressive disease in the lung bilaterally. I discussed with the patient briefly the option of resuming systemic chemotherapy likely with carboplatin  and Alimta  again.  I also discussed with him the option of palliative care on hospice especially with the significant disease progression he had recently.  The patient and his family will think about his options but he will not receive any systemic therapy at least for several weeks and until he finished his whole brain irradiation.  I will arrange for the patient a follow-up appointment with me at the [facility] health cancer [facility] in few weeks for more detailed discussion of his treatment options. The patient and his family are in agreement with the current plan. Thank you so much for taking good care of Mr. Sadler.  Please call if you have any questions. Disclaimer: This note was dictated with voice recognition software. Similar sounding words can inadvertently be transcribed and may be missed upon review. Aurelio Blower, MD

## 2024-02-22 NOTE — TOC Progression Note (Signed)
 Transition of Care [facility]) - Progression Note    Patient Details  Name: [name] MRN: [id number] Date of Birth: 13-Dec-1960  Transition of Care [facility]) CM/SW Contact  Amaryllis Junior, [place] Phone Number: 02/22/2024, 3:05 PM  Clinical Narrative:    CSW spoke with pt and pt dtr to review bed offers. Bed choice is [facility]; accepted in [place]. PASRR number issued [id number] E. Ins auth started; pending approval.    Expected Discharge Plan: Home w Home Health Services Barriers to Discharge: Continued Medical Work up  Expected Discharge Plan and Services   Discharge Planning Services: CM Consult Post Acute Care Choice: Home Health (outpatient palliative care) Living arrangements for the past 2 months: Single Family Home                 DME Arranged: N/A DME Agency: NA       HH Arranged: PT           Social Determinants of Health (SDOH) Interventions SDOH Screenings   Food Insecurity: No Food Insecurity (02/19/2024)  Housing: Low Risk  (02/19/2024)  Transportation Needs: No Transportation Needs (02/19/2024)  Utilities: Not At Risk (02/19/2024)  Tobacco Use: High Risk (02/17/2024)    Readmission Risk Interventions     No data to display

## 2024-02-22 NOTE — Progress Notes (Addendum)
 PT Cancellation Note  Patient Details Name: [name] MRN: [id number] DOB: 01/01/1961   Cancelled Treatment:    Reason Eval/Treat Not Completed: Other (comment) Per OT , family on phone and staff having conversation. Will check back tomorrow. Abelina Hoes PT Acute Rehabilitation Services Office [phone]   Dareen Ebbing 02/22/2024, 3:07 PM

## 2024-02-23 ENCOUNTER — Other Ambulatory Visit: Payer: Self-pay

## 2024-02-23 ENCOUNTER — Encounter: Payer: Self-pay | Admitting: Cardiology

## 2024-02-23 ENCOUNTER — Ambulatory Visit
Admit: 2024-02-23 | Discharge: 2024-02-23 | Disposition: A | Discharge status: Home / Self Care | Attending: Radiation Oncology | Admitting: Radiation Oncology

## 2024-02-23 DIAGNOSIS — C7931 Secondary malignant neoplasm of brain: Secondary | ICD-10-CM | POA: Diagnosis not present

## 2024-02-23 DIAGNOSIS — C3490 Malignant neoplasm of unspecified part of unspecified bronchus or lung: Secondary | ICD-10-CM | POA: Diagnosis not present

## 2024-02-23 LAB — RAD ONC ARIA SESSION SUMMARY
Course Elapsed Days: 0
Plan Fractions Treated to Date: 1
Plan Prescribed Dose Per Fraction: 3 Gy
Plan Total Fractions Prescribed: 10
Plan Total Prescribed Dose: 30 Gy
Reference Point Dosage Given to Date: 3 Gy
Reference Point Session Dosage Given: 3 Gy
Session Number: 1

## 2024-02-23 NOTE — Progress Notes (Signed)
 TO BE COMPLETED BY RADIATION ONCOLOGIST OFFICE:   Patient Name: [name]   Date of Birth: 1961-03-23   Radiation Oncologist:   Site to be Treated: Brain   Will x-rays >10 MV be used? No   Will the radiation be >10 cm from the device? Yes   Planned Treatment Start Date: 02/23/2024   TO BE COMPLETED BY CARDIOLOGIST OFFICE:   Device Information:  Pacemaker []      ICD [x]    Brand: Medtronic: [id number] Model #: [id number] Visia AF MRI VR  Serial Number: [id number]    Date of Placement: 03/02/2018  Site of Placement: left upper chest  Remote Device Check--Frequency: variable-not compliant with remote checks Last Check: 02/23/2024  Is the Patient Pacer Dependent?:  Yes []   No [x]   Does cardiologist request Radiation Oncology to schedule device testing by vendor for the following:  Prior to the Initiation of Treatments?  Yes []  No [x]  During Treatments?  Yes []  No [x]  Post Radiation Treatments?  Yes []  No [x]   Is device monitoring necessary by vendor/cardiologist team during treatments?  Yes []   No [x]   Is cardiac monitoring by Radiation Oncology nursing necessary during treatments? Yes []   No [x]   Do you recommend device be relocated prior to Radiation Treatment? Yes []   No [x]   **PLEASE LIST ANY NOTES OR SPECIAL REQUESTS: We will schedule Pt for in office follow up post radiation treatment.     CARDIOLOGIST SIGNATURE:  Dr. Agatha Horsfall Per Device Clinic Standing Orders, Forrestine Ike  02/23/2024 11:01 AM  **Please route completed form back to Radiation Oncology Nursing and "P [facility] RAD ONC ADMIN", OR send an update if there will be a delay in having form completed by expected start date.  **Call [phone] if you have any questions or do not get an in-basket response from a Radiation Oncology staff member

## 2024-02-23 NOTE — Plan of Care (Signed)

## 2024-02-23 NOTE — Final Progress Note (Addendum)
 Five live bugs were found in patient's room ,caught , bottled and kept on the sink top in pt's room. The grand daughter confirmed her grand dad has bed bugs in his room at home.Patient and room cleaned, linens changed. Infection Prevention; Tara aware, Dr Woodroe Hazel also aware, Contact precaution initiated.Will continue to monitor.

## 2024-02-23 NOTE — Progress Notes (Signed)
 Physical Therapy Treatment Patient Details Name: [name] MRN: [id number] DOB: June 21, 1961 Today's Date: 02/23/2024   History of Present Illness Pt is a 63 y.o. male who presented 02/17/24 with L leg pain s/p fall x1 day PTA. Pt was found to have lesions in the brain on CT head, concerning for metastases. Transferred to [facility] from [facility] for radiation oncology eval. PMH: R BKA, non-small cell lung cancer, hypothyroidism, chronic systolic CHF, CAD with PCI to LAD in 2017, hepatitis C, STEMI, AICD, asthma, COPD, DVT, PAD    PT Comments  Pt in bed, pleasant and agreeable to PT, pt granddtr present and friend arrives during session. Pt found to have bedbugs present prior to PT session. He is able to demonstrate strength with biased Sit to Stand functional to enter dtr home with the use of bilateral hand rails and CGA, gait belt provided and family instructed on use. Pt amb in the room x45ft at [facility] initially, able to progress to supervision A with cues for speed and navigation of small spaces. Pt has balance deficits present upon standing with and without prosthesis and tends to lean L when standing, requiring CGA to correct. Pt fall risk decreases with elevated bed height. Requires CGA to come to sit EOB with the [facility] elevated and use of hand rail.     If plan is discharge home, recommend the following: A little help with walking and/or transfers;A little help with bathing/dressing/bathroom;Assistance with cooking/housework;Direct supervision/assist for medications management;Direct supervision/assist for financial management;Assist for transportation;Help with stairs or ramp for entrance;Supervision due to cognitive status   Can travel by private vehicle     Yes  Equipment Recommendations  BSC/3in1;Hospital bed    Recommendations for Other Services       Precautions / Restrictions Precautions Precautions: Fall Recall of Precautions/Restrictions: Intact Precaution/Restrictions Comments: has prosthesis;  watch BP Required Braces or Orthoses: Other Brace Other Brace: R BKA prosthesis Restrictions Weight Bearing Restrictions Per Provider Order: No     Mobility  Bed Mobility Overal bed mobility: Needs Assistance Bed Mobility: Supine to Sit     Supine to sit: Contact guard, HOB elevated, Used rails     General bed mobility comments: Pt requires HHA with LUE and RUE on hand rail to come to sitting    Transfers Overall transfer level: Needs assistance Equipment used: Rolling walker (2 wheels) Transfers: Sit to/from Stand Sit to Stand: Supervision           General transfer comment: Pt completes 5 sit to stands with LLE bias to mimic home entry x2-3 steps, requires more SBA due to balance, loses balance to the left intermittently but is able to complete with and without prosthesis    Ambulation/Gait Ambulation/Gait assistance: Supervision Gait Distance (Feet): 30 Feet Assistive device: Rolling walker (2 wheels)   Gait velocity: dec     General Gait Details: Pt family requests practice without prosthesis as it sometimes causes him pain. He is able to demonstrate a hop to pattern with LLE, cues to keep walker close and maintain safe velocity to mitigate falls risk. demonstrates understanding, able to navigate small spaces well.   Stairs         General stair comments: Pt demonstrates functional strength adequate to complete 2-3 steps required for home entry, with the use of bilateral hand rail and CGA prn. family trained on gait belt use and provided one for home.   Wheelchair Mobility     Tilt Bed    Modified Rankin (Stroke  Patients Only)       Balance Overall balance assessment: Needs assistance Sitting-balance support: No upper extremity supported, Feet supported Sitting balance-Leahy Scale: Good     Standing balance support: Bilateral upper extremity supported, Reliant on assistive device for balance Standing balance-Leahy Scale: Poor Standing balance  comment: Reliant on RW                            Communication Communication Communication: No apparent difficulties  Cognition Arousal: Alert Behavior During Therapy: WFL for tasks assessed/performed   PT - Cognitive impairments: No apparent impairments                         Following commands: Intact      Cueing Cueing Techniques: Verbal cues, Visual cues, Gestural cues  Exercises      General Comments General comments (skin integrity, edema, etc.): R BKA prosthetic      Pertinent Vitals/Pain Pain Assessment Pain Assessment: No/denies pain Pain Intervention(s): Monitored during session    Home Living                          Prior Function            PT Goals (current goals can now be found in the care plan section) Acute Rehab PT Goals Patient Stated Goal: to not fall PT Goal Formulation: With patient/family Time For Goal Achievement: 03/03/24 Potential to Achieve Goals: Good Progress towards PT goals: Progressing toward goals    Frequency    Min 2X/week      PT Plan      Co-evaluation              AM-PAC PT "6 Clicks" Mobility   Outcome Measure  Help needed turning from your back to your side while in a flat bed without using bedrails?: A Little Help needed moving from lying on your back to sitting on the side of a flat bed without using bedrails?: A Little Help needed moving to and from a bed to a chair (including a wheelchair)?: A Little Help needed standing up from a chair using your arms (e.g., wheelchair or bedside chair)?: A Little Help needed to walk in hospital room?: A Little Help needed climbing 3-5 steps with a railing? : A Little 6 Click Score: 18    End of Session Equipment Utilized During Treatment: Gait belt Activity Tolerance: Patient tolerated treatment well Patient left: in bed;with call bell/phone within reach;with family/visitor present Nurse Communication: Mobility status PT Visit  Diagnosis: Unsteadiness on feet (R26.81);Other abnormalities of gait and mobility (R26.89);Muscle weakness (generalized) (M62.81);History of falling (Z91.81);Repeated falls (R29.6);Difficulty in walking, not elsewhere classified (R26.2)     Time: 7829-5621 PT Time Calculation (min) (ACUTE ONLY): 17 min  Charges:    $Gait Training: 8-22 mins PT General Charges $$ ACUTE PT VISIT: 1 Visit                     Darien Eden, PT Acute Rehabilitation Services Office: [phone] 02/23/2024    Serafin Dames 02/23/2024, 3:11 PM

## 2024-02-23 NOTE — Plan of Care (Signed)
  Problem: Education: Goal: Knowledge of General Education information will improve Description: Including pain rating scale, medication(s)/side effects and non-pharmacologic comfort measures Outcome: Progressing   Problem: Health Behavior/Discharge Planning: Goal: Ability to manage health-related needs will improve Outcome: Progressing   Problem: Clinical Measurements: Goal: Ability to maintain clinical measurements within normal limits will improve Outcome: Progressing Goal: Will remain free from infection Outcome: Progressing Goal: Diagnostic test results will improve Outcome: Progressing Goal: Respiratory complications will improve Outcome: Progressing Goal: Cardiovascular complication will be avoided Outcome: Progressing   Problem: Nutrition: Goal: Adequate nutrition will be maintained Outcome: Progressing   Problem: Activity: Goal: Risk for activity intolerance will decrease Outcome: Progressing   Problem: Elimination: Goal: Will not experience complications related to bowel motility Outcome: Progressing Goal: Will not experience complications related to urinary retention Outcome: Progressing   Problem: Pain Managment: Goal: General experience of comfort will improve and/or be controlled Outcome: Progressing

## 2024-02-23 NOTE — TOC Progression Note (Addendum)
 Transition of Care [facility]) - Progression Note    Patient Details  Name: [name] MRN: [id number] Date of Birth: 1961/05/04  [facility] CM/SW Contact  Levie Ream, RN Phone Number: 02/23/2024, 12:25 PM  Clinical Narrative:    -1141- spoke w/ pt in room and pt's dtr Dyan Gladden ([phone]); they were notified [facility] can accept pt today, but facility cannot provide transport to daily radiation; pt's dtr says she can take pt to appts  -1152- spoke w/ Darrian, Admissions at facility; she says pt cannot admit to SNF, and concurrently receive daily radiation; discussed w/ pt's dtr Odilia Bennett admit to SNF and having XRT after SNF vs d/c home w/ XRT; she says pt can d/c home w/ her tomorrow so that he can received daily whole brain XRT; she says she will need equipment such as hospital bed, and 3-n-1; explained to pt's dtr that pt must have qualifying criteria to receive hospital bed; she says pt needs a bed at her home; she does not have agency preference for [facility] services; Dr Sherrod Dolphin notified via secure chat.   -1419- confirmed with radiotion onc pt will have daily treatment M-Fri for 2 weeks; 10 treatments total   -1518- per TOC note on 02/20/24, pt has BSC, RW, and rolator, and HHPT had been arranged w [facility]; pt's dtr Ashely says he does not have BSC; she would also like referral for home hospice w/ [facility]; pt currently active w/ [facility] for OP palliative care; his dtr also verified d/c address: [address] [facility] [address]; referral sent to Madelene Schanz, [facility] hospital liaison via secure chat; DME has not been ordered yet because his dtr would like to discuss w/ hospice.   -1643- notified by [facility] Liaison for [facility], hospice would not be a possibility in this case due to patient seeking continued treatment; she says pt could be considered for home hospice as long as he fits the criteria of a terminal diagnosis with 6 month prognosis; pt's dtr  Odilia Bennett notified; she would like to proceed w/ pt receiving DME; also confirmed w/ Randel Buss at [place] that agency can provide HHPT/OT/RN; referral for DME placed w/ Jermaine at [facility]; he was also given dtr's contact info; bedside RN will educate pt's family on daily wound care.  Expected Discharge Plan: Home w Home Health Services Barriers to Discharge: Continued Medical Work up  Expected Discharge Plan and Services   Discharge Planning Services: CM Consult Post Acute Care Choice: Home Health (outpatient palliative care) Living arrangements for the past 2 months: Single Family Home                 DME Arranged: N/A DME Agency: NA       HH Arranged: PT           Social Determinants of Health (SDOH) Interventions SDOH Screenings   Food Insecurity: No Food Insecurity (02/19/2024)  Housing: Low Risk  (02/19/2024)  Transportation Needs: No Transportation Needs (02/19/2024)  Utilities: Not At Risk (02/19/2024)  Tobacco Use: High Risk (02/17/2024)    Readmission Risk Interventions     No data to display

## 2024-02-23 NOTE — Progress Notes (Signed)
    Durable Medical Equipment  (From admission, onward)           Start     Ordered   02/23/24 1256  For home use only DME 3 n 1  Once        02/23/24 1257   02/23/24 1256  For home use only DME Hospital bed  Once       Question Answer Comment  Length of Need Lifetime   Bed type Semi-electric      02/23/24 1257

## 2024-02-23 NOTE — Progress Notes (Signed)
 PROGRESS NOTE   [name]  LKG:[id number]    DOB: 03-Mar-1961    DOA: 02/17/2024  PCP: [name], MD   I have briefly reviewed patients previous medical records in [facility].   Brief Hospital Course:   63 y.o. male with a past medical history of non-small cell lung cancer, hypothyroidism, chronic systolic CHF, coronary artery disease with PCI to LAD in 2017, who was trying to go up the ramp at his house using a walker when he felt his left leg to give out and he as result fell. He has a history of right below-knee amputation as well. Patient experienced persistent pain in his left leg as a result of his fall and decided to come into the hospital for evaluation. Evaluation in the ED raised concern for metastatic disease in his brain. Cerebral edema was noted. He was subsequently hospitalized. He does not remember the last time he was treated for his cancer. Based on EMR he has not been seen by Dr. Marguerita Shih since 2023.  MRI brain confirmed multiple brain mets.  Transferred from [facility] to [facility] for XRT. Unable to get SNF with daily transport to XRT and patient/family have opted to DC home 5/30 and they will bring him for outpatient XRT.  TOC arranging DME's.   Assessment & Plan:   Non-small cell lung cancer now with metastases/brain mets CT of the chest abdomen pelvis shows increase in the size of the right lung mass.  Other chronic changes were noted.  No evidence for metastatic process in the abdominal cavity. He does have brain lesions based on CT head which is new.  Vasogenic edema was noted.  Patient was started on dexamethasone .   MRI brain showed widespread brain metastasis (18 enhancing metastasis ranging from punctate up to 24 mm).  Multifocal vasogenic edema, most pronounced in the left cerebellum with some posterior for some mass effect but no ventriculomegaly, midline shift or other complicating features Patient has previously been seen by Dr. Marguerita Shih.  However has not been seen  since 2023.   Started on Keppra  due to concern for seizure activity prior to admission.  EEG did not show any epileptiform activity.   Patient was transferred from [facility] to [facility] for radiation oncology evaluation for whole brain RT.  It appears that he went down for planning today and starting XRT on 5/29.  No lung radiation planned. Dr. Marguerita Shih medical oncologist input appreciated.  He will arrange outpatient follow-up in several weeks for consideration of systemic chemotherapy. As communicated extensively with TOC and with Dr. Jeryl Moris, Radiation oncology, plan is for DC home on 5/30 on current dose of Decadron  4 mg 3 times daily and radiation oncology will taper dose as appropriate.  Hyponatremia ?  SIADH.  Serum sodium stable in the low 130s.  It could also be due to meds/Celexa .   Chronic systolic CHF/ICD in situ Last echocardiogram from 2023 showed LVEF of 30 to 35%.  No evidence for volume overload currently.   Echocardiogram done during this admission shows that the LVEF is about the same. Continue to monitor. Has a defibrillator in situ.  Clinically euvolemic.   Hypotension Seems to be chronic since he is on midodrine  prior to admission.  This is being continued.  Blood pressure soft but stable.  He remains asymptomatic.   Coronary artery disease Has a stent to LAD which was placed in 2017.  Unclear if he takes any antiplatelet agents at home.  None listed on his home  medication list.  No anginal symptoms.   Gait ataxia most likely due to brain lesion He fell as a result of his imbalance.  Right BKA complicating gait as well. Imbalance most likely due to brain metastases. Seen by physical therapy.  SNF recommended.  TOC consulted for same. Patient does have right BKA and uses prosthesis at home.  Uses a walker to ambulate. As detailed in [facility] note, unable to get SNF where able to transport patient for daily XRT and patient/family have thereby opted to DC home.   Hypothyroidism Continue  levothyroxine .   Status post right BKA Uses a prosthesis at home.    Stage III pressure injury coccyx Seen by wound care nurse.  Tobacco abuse Cessation counseled.  Continue nicotine  patch.  Body mass index is 21.11 kg/m.   DVT prophylaxis: SCDs Start: 02/17/24 1754     Code Status: Full Code:  Family Communication: Daughter at bedside. Disposition:  DC home tomorrow post completion of that day XRT     Consultants:   Medical oncology Radiation oncology  Procedures:     Subjective:  Chronic left leg tingling and numbness.  No other complaints reported.  Objective:   Vitals:   02/22/24 0727 02/22/24 2043 02/23/24 0502 02/23/24 1337  BP: 101/66 (!) 94/58 101/79 98/65  Pulse: 78 82 75 90  Resp: 20 18 18 16   Temp: 97.7 F (36.5 C) 98.1 F (36.7 C) 98.2 F (36.8 C) 97.7 F (36.5 C)  TempSrc: Oral Oral Oral Oral  SpO2: 98% 96% 100% 99%  Weight:      Height:        General exam: Middle-age male, moderately built and frail lying comfortably propped up in bed without distress.  Appears to be in great spirits. Respiratory system: Clear to auscultation. Respiratory effort normal. Cardiovascular system: S1 & S2 heard, RRR. No JVD, murmurs, rubs, gallops or clicks. No pedal edema. Gastrointestinal system: Abdomen is nondistended, soft and nontender. No organomegaly or masses felt. Normal bowel sounds heard. Central nervous system: Alert and oriented. No focal neurological deficits. Extremities: Symmetric 5 x 5 power.  Healed right BKA. Skin: No rashes, lesions or ulcers Psychiatry: Judgement and insight appear normal. Mood & affect appropriate.     Data Reviewed:   I have personally reviewed following labs and imaging studies   CBC: Recent Labs  Lab 02/17/24 1553 02/18/24 1114  WBC 13.1* 9.4  NEUTROABS 10.8*  --   HGB 12.2* 13.0  HCT 37.7* 40.1  MCV 84.9 83.7  PLT 416* 405*    Basic Metabolic Panel: Recent Labs  Lab 02/17/24 1553 02/18/24 1114  02/20/24 0500 02/22/24 1000  NA 133* 132* 132* 130*  K 3.6 4.2 4.0 4.3  CL 96* 95* 97* 94*  CO2 27 25 27 26   GLUCOSE 92 114* 141* 145*  BUN <5* 5* 15 18  CREATININE 0.65 0.71 0.68 0.88  CALCIUM  8.2* 8.8* 8.4* 8.5*  MG  --   --  1.7 2.5*    Liver Function Tests: Recent Labs  Lab 02/17/24 1553 02/18/24 1114  AST 14* 15  ALT 8 10  ALKPHOS 82 84  BILITOT 0.6 0.9  PROT 5.7* 6.1*  ALBUMIN  2.8* 2.8*    CBG: Recent Labs  Lab 02/19/24 1746  GLUCAP 207*    Microbiology Studies:   Recent Results (from the past 240 hours)  Blood culture (routine x 2)     Status: None   Collection Time: 02/17/24  4:18 PM   Specimen: BLOOD  Result Value Ref Range Status   Specimen Description BLOOD LEFT ANTECUBITAL  Final   Special Requests   Final    BOTTLES DRAWN AEROBIC ONLY Blood Culture results may not be optimal due to an inadequate volume of blood received in culture bottles   Culture   Final    NO GROWTH 5 DAYS Performed at [facility], [address] [place], [place] [address]    Report Status 02/22/2024 FINAL  Final  Blood culture (routine x 2)     Status: None   Collection Time: 02/17/24  4:18 PM   Specimen: BLOOD  Result Value Ref Range Status   Specimen Description BLOOD LEFT ANTECUBITAL  Final   Special Requests   Final    BOTTLES DRAWN AEROBIC ONLY Blood Culture results may not be optimal due to an inadequate volume of blood received in culture bottles   Culture   Final    NO GROWTH 5 DAYS Performed at [facility], [address], [place], [place] [address]    Report Status 02/22/2024 FINAL  Final    Radiology Studies:  No results found.   Scheduled Meds:    amitriptyline   100 mg Oral QHS   Chlorhexidine  Gluconate Cloth  6 each Topical Daily   citalopram   40 mg Oral Daily   dexamethasone   4 mg Oral Q8H   feeding supplement  237 mL Oral BID BM   leptospermum manuka honey  1 Application Topical Daily   levETIRAcetam   500 mg Oral BID    levothyroxine   175 mcg Oral QAC breakfast   liver oil-zinc oxide   Topical BID   midodrine   10 mg Oral TID WC   nicotine   14 mg Transdermal Daily   polyethylene glycol  17 g Oral Daily   senna-docusate  2 tablet Oral BID   sodium chloride  flush  10-40 mL Intracatheter Q12H    Continuous Infusions:     LOS: 5 days     Aubrey Blas, MD,  FACP, [facility], [facility], [facility]   [facility] [place]      To contact the attending provider between 7A-7P or the covering provider during after hours 7P-7A, please log into the web site [email or web address] and access using universal [place] password for that web site. If you do not have the password, please call the hospital operator.  02/23/2024, 4:09 PM

## 2024-02-24 ENCOUNTER — Ambulatory Visit
Admit: 2024-02-24 | Discharge: 2024-02-24 | Disposition: A | Discharge status: Home / Self Care | Attending: Radiation Oncology | Admitting: Radiation Oncology

## 2024-02-24 ENCOUNTER — Other Ambulatory Visit: Payer: Self-pay

## 2024-02-24 DIAGNOSIS — C3490 Malignant neoplasm of unspecified part of unspecified bronchus or lung: Secondary | ICD-10-CM | POA: Diagnosis not present

## 2024-02-24 DIAGNOSIS — C799 Secondary malignant neoplasm of unspecified site: Secondary | ICD-10-CM | POA: Diagnosis not present

## 2024-02-24 DIAGNOSIS — R2689 Other abnormalities of gait and mobility: Secondary | ICD-10-CM | POA: Diagnosis not present

## 2024-02-24 DIAGNOSIS — C7931 Secondary malignant neoplasm of brain: Secondary | ICD-10-CM | POA: Diagnosis not present

## 2024-02-24 LAB — RAD ONC ARIA SESSION SUMMARY
Course Elapsed Days: 1
Plan Fractions Treated to Date: 2
Plan Prescribed Dose Per Fraction: 3 Gy
Plan Total Fractions Prescribed: 10
Plan Total Prescribed Dose: 30 Gy
Reference Point Dosage Given to Date: 6 Gy
Reference Point Session Dosage Given: 3 Gy
Session Number: 2

## 2024-02-24 MED ORDER — ATORVASTATIN CALCIUM 80 MG PO TABS: 80.0000 mg | ORAL_TABLET | Freq: Every day | ORAL | 2 refills | Status: DC

## 2024-02-24 MED ORDER — ENSURE ENLIVE PO LIQD: 237.0000 mL | Freq: Two times a day (BID) | ORAL | 12 refills | Status: DC

## 2024-02-24 MED ORDER — MEDIHONEY WOUND/BURN DRESSING EX PSTE: 1.0000 | PASTE | Freq: Every day | CUTANEOUS | 0 refills | Status: DC

## 2024-02-24 MED ORDER — SENNOSIDES-DOCUSATE SODIUM 8.6-50 MG PO TABS: 2.0000 | ORAL_TABLET | Freq: Two times a day (BID) | ORAL | 0 refills | Status: DC

## 2024-02-24 MED ORDER — POLYETHYLENE GLYCOL 3350 17 G PO PACK: 17.0000 g | PACK | Freq: Every day | ORAL | 0 refills | Status: DC

## 2024-02-24 MED ORDER — HEPARIN SOD (PORK) LOCK FLUSH 100 UNIT/ML IV SOLN
500.0000 [IU] | Freq: Once | INTRAVENOUS | Status: AC
  Administered 2024-02-24: 500 [IU] via INTRAVENOUS
  Filled 2024-02-24: qty 5

## 2024-02-24 MED ORDER — NICOTINE 14 MG/24HR TD PT24: 14.0000 mg | MEDICATED_PATCH | Freq: Every day | TRANSDERMAL | 0 refills | Status: DC

## 2024-02-24 MED ORDER — ZINC OXIDE 40 % EX OINT: TOPICAL_OINTMENT | Freq: Two times a day (BID) | CUTANEOUS | 0 refills | Status: DC

## 2024-02-24 MED ORDER — LEVETIRACETAM 500 MG PO TABS: 500.0000 mg | ORAL_TABLET | Freq: Two times a day (BID) | ORAL | 2 refills | Status: DC

## 2024-02-24 NOTE — Plan of Care (Signed)

## 2024-02-24 NOTE — Plan of Care (Signed)
   Problem: Education: Goal: Knowledge of General Education information will improve Description Including pain rating scale, medication(s)/side effects and non-pharmacologic comfort measures Outcome: Progressing   Problem: Health Behavior/Discharge Planning: Goal: Ability to manage health-related needs will improve Outcome: Progressing

## 2024-02-24 NOTE — TOC Progression Note (Signed)
 Transition of Care [facility]) - Progression Note    Patient Details  Name: NAWAF STRANGE MRN: [id number] Date of Birth: 1961/08/17  [facility] CM/SW Contact  Jonni Nettle, LCSW Phone Number: 02/24/2024, 3:34 PM  Clinical Narrative:    Per MD, pt is medically stable for discharge today. Pt waiting on hospital bed to be delivered to home prior to discharge. CSW spoke with Jermaine at [facility] who reports family scheduled hospital bed delivery for Monday 6/2 due to not having adequate space for hospital bed at this time. Pt not able to discharge home until hospital bed is delivered to home. MD made aware. TOC will continue to follow.   Expected Discharge Plan: Home w Home Health Services Barriers to Discharge: Continued Medical Work up  Expected Discharge Plan and Services   Discharge Planning Services: CM Consult Post Acute Care Choice: Home Health (outpatient palliative care) Living arrangements for the past 2 months: Single Family Home Expected Discharge Date: 02/25/24               DME Arranged: N/A DME Agency: NA       HH Arranged: PT    Social Determinants of Health (SDOH) Interventions SDOH Screenings   Food Insecurity: No Food Insecurity (02/19/2024)  Housing: Low Risk  (02/19/2024)  Transportation Needs: No Transportation Needs (02/19/2024)  Utilities: Not At Risk (02/19/2024)  Tobacco Use: High Risk (02/17/2024)    Readmission Risk Interventions     No data to display

## 2024-02-24 NOTE — Discharge Summary (Signed)
 Physician Discharge Summary  [name] FAO:[id number] DOB: 1961/03/19  PCP: [name], MD  Admitted from: Home Discharged to: Home  Admit date: 02/17/2024 Discharge date: 02/24/2024  Recommendations for Outpatient Follow-up:    Follow-up Information     Care, [facility] Follow up.   Specialty: Home Health Services Contact information: [address] [place] [address] [phone]         [facility] Follow up.   Contact information: [address] [place]  [address] [phone]        Johna Myers, MD Follow up.   Specialty: Radiation Oncology Why: Continue follow-up for daily radiation treatment. Contact information: [address] [place] [address] [phone]         [name], MD. Schedule an appointment as soon as possible for a visit in 2 week(s).   Specialty: Internal Medicine Why: To be seen with repeat labs (CBC & BMP) Contact information: [address] [place] [address] [phone]         Marlene Simas, MD. Schedule an appointment as soon as possible for a visit.   Specialty: Oncology Contact information: [address] [place] [address] [phone]                  Home Health: Home Health Orders (From admission, onward)     Start     Ordered   02/23/24 1258  Home Health  At discharge       Question Answer Comment  To provide the following care/treatments PT   To provide the following care/treatments OT   To provide the following care/treatments RN      02/23/24 1301   02/20/24 1115  Home Health  At discharge       Question:  To provide the following care/treatments  Answer:  PT   02/20/24 1115             Equipment/Devices:     Durable Medical Equipment  (From admission, onward)           Start     Ordered   02/23/24 1256  For home use only DME 3 n 1  Once        02/23/24 1257   02/23/24 1256  For home use  only DME Hospital bed  Once       Question Answer Comment  Length of Need Lifetime   Bed type Semi-electric      02/23/24 1257             Discharge Condition: Improved and stable.   Code Status: Full Code Diet recommendation:     Discharge Diagnoses:  Principal Problem:   Imbalance Active Problems:   CAD (coronary artery disease)   Non-small cell carcinoma of right lung, stage 3 (HCC)   Hypothyroidism   HFrEF (heart failure with reduced ejection fraction) (HCC)   S/P implantation of automatic cardioverter/defibrillator (AICD)   Lung cancer metastatic to brain [facility])   Counseling regarding advance care planning and goals of care   Brief Hospital Course:   63 y.o. male with a past medical history of non-small cell lung cancer, hypothyroidism, chronic systolic CHF, coronary artery disease with PCI to LAD in 2017, who was trying to go up the ramp at his house using a walker when he felt his left leg to give out and he as result fell. He has a history of right below-knee amputation as well. Patient experienced persistent pain in his  left leg as a result of his fall and decided to come into the hospital for evaluation. Evaluation in the ED raised concern for metastatic disease in his brain. Cerebral edema was noted. He was subsequently hospitalized. He does not remember the last time he was treated for his cancer. Based on EMR he has not been seen by Dr. Marguerita Shih since 2023.  MRI brain confirmed multiple brain mets.  Transferred from [facility] to [facility] for XRT. Unable to get SNF with daily transport to XRT and patient/family have opted to DC home 5/30 and they will bring him for outpatient XRT.  TOC arranging DME's.  Patient medically ready for DC on 5/30.  DC paperwork completed.  However as per discussion with daughter, home health agency advised her that they will not be able to deliver hospital bed until Monday, 02/27/2024 and hence she is requesting that patient remain in the hospital until then  due to lack of bed or appropriate equipment at her home.  Updated TOC to try and expedite DME for an earlier discharge.     Assessment & Plan:    Non-small cell lung cancer now with metastases/brain mets CT of the chest abdomen pelvis shows increase in the size of the right lung mass.  Other chronic changes were noted.  No evidence for metastatic process in the abdominal cavity. He does have brain lesions based on CT head which is new.  Vasogenic edema was noted.  Patient was started on dexamethasone .   MRI brain showed widespread brain metastasis (18 enhancing metastasis ranging from punctate up to 24 mm).  Multifocal vasogenic edema, most pronounced in the left cerebellum with some posterior for some mass effect but no ventriculomegaly, midline shift or other complicating features Patient has previously been seen by Dr. Marguerita Shih.  However has not been seen since 2023.   Started on Keppra  due to concern for seizure activity prior to admission.  EEG did not show any epileptiform activity.   Patient was transferred from [facility] to [facility] for radiation oncology evaluation for whole brain RT.  It appears that he went down for planning today and starting XRT on 5/29.  No lung radiation planned. Dr. Marguerita Shih medical oncologist input appreciated.  He will arrange outpatient follow-up in several weeks for consideration of systemic chemotherapy. As communicated extensively with TOC and with Dr. Jeryl Moris, Radiation oncology, plan is for DC home on 5/30 on current dose of Decadron  4 mg 3 times daily and radiation oncology will taper dose as appropriate.   Hyponatremia ?  SIADH.  Serum sodium stable in the low 130s.  It could also be due to meds/Celexa .  Periodically follow BMP as outpatient.   Chronic systolic CHF/ICD in situ Last echocardiogram from 2023 showed LVEF of 30 to 35%.  No evidence for volume overload currently.  Lasix  discontinued from home med list at discharge. Echocardiogram done during this admission  shows that the LVEF is about the same. Continue to monitor. Has a defibrillator in situ.  Clinically euvolemic.   Hypotension Seems to be chronic since he is on midodrine  prior to admission.  This is being continued.  Blood pressure soft but stable.  He remains asymptomatic. Also did not resume Flomax  that he was on PTA given that he was not getting this in the hospital and was asymptomatic of urinary symptoms.   Coronary artery disease Has a stent to LAD which was placed in 2017.  No anginal symptoms. He was not taking aspirin  and statins PTA, resumed/reordered given  his history of CAD.   Gait ataxia most likely due to brain lesion He fell as a result of his imbalance.  Right BKA complicating gait as well. Imbalance most likely due to brain metastases. Seen by physical therapy.  SNF recommended.  TOC consulted for same. Patient does have right BKA and uses prosthesis at home.  Uses a walker to ambulate. As detailed in [facility] note, unable to get SNF where able to transport patient for daily XRT and patient/family have thereby opted to DC home.   Hypothyroidism Continue levothyroxine .   Status post right BKA Uses a prosthesis at home.    Stage III pressure injury coccyx Seen by wound care nurse.  Continue management per their recommendations.   Tobacco abuse Cessation counseled.  Continue nicotine  patch.   Body mass index is 21.11 kg/m.     Consultants:   Medical oncology Radiation oncology   Procedures:       Discharge Instructions  Discharge Instructions     (HEART FAILURE PATIENTS) Call MD:  Anytime you have any of the following symptoms: 1) 3 pound weight gain in 24 hours or 5 pounds in 1 week 2) shortness of breath, with or without a dry hacking cough 3) swelling in the hands, feet or stomach 4) if you have to sleep on extra pillows at night in order to breathe.   Complete by: As directed    Call MD for:  difficulty breathing, headache or visual disturbances    Complete by: As directed    Call MD for:  extreme fatigue   Complete by: As directed    Call MD for:  persistant dizziness or light-headedness   Complete by: As directed    Call MD for:  persistant nausea and vomiting   Complete by: As directed    Call MD for:  severe uncontrolled pain   Complete by: As directed    Call MD for:  temperature >100.4   Complete by: As directed    Diet general   Complete by: As directed    Discharge wound care:   Complete by: As directed    Wound care  Daily      Comments: Cleanse sacral wound with NS, apply Medihoney to wound bed daily, cover with dry gauze and silicone foam   Increase activity slowly   Complete by: As directed         Medication List     STOP taking these medications    folic acid  1 MG tablet Commonly known as: FOLVITE    furosemide  20 MG tablet Commonly known as: LASIX    lisinopril  2.5 MG tablet Commonly known as: ZESTRIL    prochlorperazine  10 MG tablet Commonly known as: COMPAZINE    sucralfate  1 g tablet Commonly known as: Carafate    tamsulosin  0.4 MG Caps capsule Commonly known as: FLOMAX        TAKE these medications    albuterol  108 (90 Base) MCG/ACT inhaler Commonly known as: VENTOLIN  HFA Inhale 1-2 puffs into the lungs every 4 (four) hours as needed for wheezing or shortness of breath.   alprazolam  2 MG tablet Commonly known as: XANAX  Take 2 mg by mouth 4 (four) times daily as needed for anxiety.   amitriptyline  100 MG tablet Commonly known as: ELAVIL  Take 100 mg by mouth at bedtime.   aspirin  EC 81 MG tablet Take 81 mg by mouth daily.   atorvastatin  80 MG tablet Commonly known as: LIPITOR  Take 1 tablet (80 mg total) by mouth daily.  citalopram  40 MG tablet Commonly known as: CELEXA  Take 40 mg by mouth daily.   dexamethasone  4 MG tablet Commonly known as: DECADRON  Take 1 tablet (4 mg total) by mouth 3 (three) times daily.   feeding supplement Liqd Take 237 mLs by mouth 2 (two) times  daily between meals.   HYDROcodone -acetaminophen  10-325 MG tablet Commonly known as: NORCO Take 1 tablet by mouth every 4 (four) hours as needed for moderate pain.   leptospermum manuka honey Pste paste Apply 1 Application topically daily. Cleanse sacral wound with NS, apply Medihoney to wound bed daily, cover with dry gauze and silicone foam Interchangeable with TheraHoney. Apply thin layer (3 mm) to wound. Start taking on: Feb 25, 2024   levETIRAcetam  500 MG tablet Commonly known as: KEPPRA  Take 1 tablet (500 mg total) by mouth 2 (two) times daily.   levothyroxine  175 MCG tablet Commonly known as: SYNTHROID  Take 175 mcg by mouth daily before breakfast. What changed: Another medication with the same name was removed. Continue taking this medication, and follow the directions you see here.   lidocaine -prilocaine  cream Commonly known as: EMLA  Apply 1 application topically as needed. What changed:  when to take this reasons to take this   liver oil-zinc  oxide 40 % ointment Commonly known as: DESITIN Apply topically 2 (two) times daily. Apply to a thin layer of Desitin to intact skin of buttocks 2 times daily and as needed for soiling   midodrine  10 MG tablet Commonly known as: PROAMATINE  Take 10 mg by mouth 3 (three) times daily.   nicotine  14 mg/24hr patch Commonly known as: NICODERM CQ  - dosed in mg/24 hours Place 1 patch (14 mg total) onto the skin daily. Start taking on: Feb 25, 2024   nitroGLYCERIN  0.4 MG SL tablet Commonly known as: NITROSTAT  Place 1 tablet (0.4 mg total) under the tongue every 5 (five) minutes x 3 doses as needed for chest pain.   polyethylene glycol 17 g packet Commonly known as: MIRALAX  / GLYCOLAX  Take 17 g by mouth daily. Start taking on: Feb 25, 2024   senna-docusate 8.6-50 MG tablet Commonly known as: Senokot-S Take 2 tablets by mouth 2 (two) times daily.       Allergies  Allergen Reactions   Lyrica [Pregabalin] Palpitations     Chest pain and heart palps.   Neurontin [Gabapentin] Palpitations    Chest pain and heart palp      Procedures/Studies: MR Brain W and Wo Contrast Result Date: 02/21/2024 CLINICAL DATA:  63 year old male with recent fall. Cerebral vasogenic edema on CT suggesting metastatic disease and right upper lung masslike area. EXAM: MRI HEAD WITHOUT AND WITH CONTRAST TECHNIQUE: Multiplanar, multiecho pulse sequences of the brain and surrounding structures were obtained without and with intravenous contrast. CONTRAST:  8mL GADAVIST  GADOBUTROL  1 MMOL/ML IV SOLN COMPARISON:  Head CT 02/17/2024. Previous staging brain MRI 12/17/2020. FINDINGS: Brain: Multiple enhancing brain metastases. Eighteen separate enhancing metastases are identified ranging from punctate (series 16, image 122 left parietal lobe) up to 24 mm long axis (left cerebellar hemisphere series 16, image 48). Associated vasogenic edema with T2 and FLAIR hyperintensity in the left occipital lobe, left cerebellum tracking to the midline, left posterior parietal lobe, anterior right frontal lobe. Of these, the left cerebellar mass defect is greatest with partial effacement of the 4th ventricle. But no ventriculomegaly. No midline shift. Basilar cisterns remain patent. No dural or leptomeningeal thickening identified. No superimposed restricted diffusion to suggest acute infarction. No extra-axial collection or acute intracranial  hemorrhage. Some of the lesions have minimal associated hemosiderin on SWI. Cervicomedullary junction and pituitary are within normal limits. Vascular: Major intracranial vascular flow voids are stable since 2022, distal left vertebral artery appears dominant. Following contrast the major dural venous sinuses are enhancing and appear to be patent. Skull and upper cervical spine: Compared to 2022 visible bone marrow signal stable and within normal limits. Negative visible cervical spine and spinal cord. Sinuses/Orbits: Negative orbits.  Chronic paranasal sinus retention cysts. Other: Mild chronic mastoid fluid. Visible internal auditory structures appear normal. Negative visible scalp and face. IMPRESSION: 1. Positive for widespread Brain metastases. Eighteen enhancing metastases suspected ranging from punctate (left parietal lobe) up to 24 mm (left cerebellum). 2. Multifocal vasogenic edema associated, most pronounced in the left cerebellum with some posterior fossa mass effect, but no ventriculomegaly, midline shift, or other complicating features at this time. Electronically Signed   By: Marlise Simpers M.D.   On: 02/21/2024 11:43   ECHOCARDIOGRAM LIMITED Result Date: 02/20/2024    ECHOCARDIOGRAM LIMITED REPORT   Patient Name:   [name] Date of Exam: 02/20/2024 Medical Rec #:  [id number]   Height:       72.0 in Accession #:    [id number]  Weight:       191.8 lb Date of Birth:  1961-05-12   BSA:          2.093 m Patient Age:    63 years    BP:           94/68 mmHg Patient Gender: M           HR:           79 bpm. Exam Location:  Inpatient Procedure: Limited Echo (Both Spectral and Color Flow Doppler were utilized            during procedure). Indications:    Reattempt to see if LVEF can be calculated  History:        Patient has prior history of Echocardiogram examinations, most                 recent 02/18/2024. CAD, COPD; Risk Factors:Current Smoker.  Sonographer:    Denese Finn RCS Referring Phys: [phone] [facility]  Sonographer Comments: Technically difficult study due to poor echo windows. IMPRESSIONS  1. Global hypokinesis with abnormal septal motion . Left ventricular ejection fraction, by estimation, is 30 to 35%. The left ventricle has moderately decreased function. The left ventricle demonstrates global hypokinesis. There is mild left ventricular  hypertrophy.  2. Device lead in RV. Right ventricular systolic function is normal. The right ventricular size is normal.  3. The inferior vena cava is normal in size with <50% respiratory  variability, suggesting right atrial pressure of 8 mmHg. FINDINGS  Left Ventricle: Global hypokinesis with abnormal septal motion. Left ventricular ejection fraction, by estimation, is 30 to 35%. The left ventricle has moderately decreased function. The left ventricle demonstrates global hypokinesis. There is mild left  ventricular hypertrophy. Right Ventricle: Device lead in RV. The right ventricular size is normal. No increase in right ventricular wall thickness. Right ventricular systolic function is normal. Pericardium: There is no evidence of pericardial effusion. Venous: The inferior vena cava is normal in size with less than 50% respiratory variability, suggesting right atrial pressure of 8 mmHg. Additional Comments: 3D was performed not requiring image post processing on an independent workstation and was indeterminate. A device lead is visualized.  LEFT VENTRICLE PLAX 2D LVIDd:  5.30 cm LVIDs:         4.80 cm LV PW:         1.00 cm LV IVS:        1.20 cm  LV Volumes (MOD) LV vol d, MOD A2C: 69.9 ml LV vol d, MOD A4C: 128.0 ml LV vol s, MOD A2C: 52.0 ml LV vol s, MOD A4C: 88.4 ml LV SV MOD A2C:     17.9 ml LV SV MOD A4C:     128.0 ml LV SV MOD BP:      32.2 ml LEFT ATRIUM         Index LA diam:    3.10 cm 1.48 cm/m   AORTA Ao Root diam: 3.40 cm Janelle Mediate MD Electronically signed by Janelle Mediate MD Signature Date/Time: 02/20/2024/1:23:07 PM    Final    ECHOCARDIOGRAM COMPLETE Result Date: 02/18/2024    ECHOCARDIOGRAM REPORT   Patient Name:   [name] Date of Exam: 02/18/2024 Medical Rec #:  [id number]   Height:       72.0 in Accession #:    [id number]  Weight:       191.8 lb Date of Birth:  [birth year]   BSA:          2.093 m Patient Age:    63 years    BP:           90/61 mmHg Patient Gender: M           HR:           90 bpm. Exam Location:  Inpatient Procedure: 2D Echo, Cardiac Doppler and Color Doppler (Both Spectral and Color            Flow Doppler were utilized during procedure).  Indications:    CHF-Acute Systolic I50.21  History:        Patient has prior history of Echocardiogram examinations, most                 recent 10/08/2021. Cardiomyopathy, Acute MI and CAD, COPD,                 Signs/Symptoms:Hypotension; Risk Factors:Current Smoker.  Sonographer:    Terrilee Few RCS Referring Phys: [id number] Maylene Spear IMPRESSIONS  1. Limited study due to poor acoustic windows. LV function cannot be assessed.  2. Right ventricular systolic function is normal. The right ventricular size is normal.  3. The aortic valve is grossly normal. Aortic valve regurgitation is not visualized.  4. The inferior vena cava is normal in size with greater than 50% respiratory variability, suggesting right atrial pressure of 3 mmHg. FINDINGS  Left Ventricle: Right Ventricle: The right ventricular size is normal. Right vetricular wall thickness was not well visualized. Right ventricular systolic function is normal. Left Atrium: Left atrial size was normal in size. Tricuspid Valve: The tricuspid valve is grossly normal. Tricuspid valve regurgitation is trivial. Aortic Valve: The aortic valve is grossly normal. Aortic valve regurgitation is not visualized. Pulmonic Valve: The pulmonic valve was grossly normal. Pulmonic valve regurgitation is trivial. Venous: The inferior vena cava is normal in size with greater than 50% respiratory variability, suggesting right atrial pressure of 3 mmHg.  IVC IVC diam: 1.70 cm Grady Lawman MD Electronically signed by Grady Lawman MD Signature Date/Time: 02/18/2024/1:45:44 PM    Final    EEG adult Result Date: 02/18/2024 Eleni Griffin, MD     02/19/2024 12:41 PM Routine EEG Report [name] is a 63 y.o.  male with a history of altered mental status and brain tumor who is undergoing an EEG to evaluate for seizures. Report: This EEG was acquired with electrodes placed according to the International 10-20 electrode system (including Fp1, Fp2, F3, F4, C3, C4, P3, P4, O1, O2,  T3, T4, T5, T6, A1, A2, Fz, Cz, Pz). The following electrodes were missing or displaced: none. The occipital dominant rhythm was 11 Hz. This activity is reactive to stimulation. Drowsiness was manifested by background fragmentation; deeper stages of sleep were not identified. There was focal slowing over the left temporal region. There were no interictal epileptiform discharges. There were no electrographic seizures identified. Photic stimulation and hyperventilation were not performed. Impression and clinical correlation: This EEG was obtained while awake and drowsy and is abnormal due to focal slowing over the left posterior region (indicating focal cerebral dysfunction). That location corresponds to the area where CT scan showed the most significant abnormality. No epileptiform abnormalities were seen during this recording. Greg Leaks, MD [facility] [phone] If 7pm- 7am, please page neurology on call as listed in AMION.   VAS US  LOWER EXTREMITY VENOUS (DVT) (7a-7p) Result Date: 02/18/2024  Lower Venous DVT Study Patient Name:  [name]  Date of Exam:   02/17/2024 Medical Rec #: [id number]    Accession #:    [id number] Date of Birth: 1960-12-15    Patient Gender: M Patient Age:   68 years Exam Location:  [facility] Procedure:      VAS US  LOWER EXTREMITY VENOUS (DVT) Referring Phys: Aleatha Hunting --------------------------------------------------------------------------------  Indications: Edema.  Comparison Study: No prior studies. Performing Technologist: Lerry Ransom RVT  Examination Guidelines: A complete evaluation includes B-mode imaging, spectral Doppler, color Doppler, and power Doppler as needed of all accessible portions of each vessel. Bilateral testing is considered an integral part of a complete examination. Limited examinations for reoccurring indications may be performed as noted. The reflux portion of the exam is performed with the patient in reverse Trendelenburg.   +-----+---------------+---------+-----------+----------+--------------+ RIGHTCompressibilityPhasicitySpontaneityPropertiesThrombus Aging +-----+---------------+---------+-----------+----------+--------------+ CFV  Full           Yes      Yes                                 +-----+---------------+---------+-----------+----------+--------------+   +---------+---------------+---------+-----------+----------+--------------+ LEFT     CompressibilityPhasicitySpontaneityPropertiesThrombus Aging +---------+---------------+---------+-----------+----------+--------------+ CFV      Full           Yes      Yes                                 +---------+---------------+---------+-----------+----------+--------------+ SFJ      Full                                                        +---------+---------------+---------+-----------+----------+--------------+ FV Prox  Full                                                        +---------+---------------+---------+-----------+----------+--------------+ FV Mid   Full                                                        +---------+---------------+---------+-----------+----------+--------------+  FV DistalFull                                                        +---------+---------------+---------+-----------+----------+--------------+ PFV      Full                                                        +---------+---------------+---------+-----------+----------+--------------+ POP      Full           Yes      Yes                                 +---------+---------------+---------+-----------+----------+--------------+ PTV      Full                                                        +---------+---------------+---------+-----------+----------+--------------+ PERO     Full                                                         +---------+---------------+---------+-----------+----------+--------------+     Summary: RIGHT: - No evidence of common femoral vein obstruction.   LEFT: - There is no evidence of deep vein thrombosis in the lower extremity.  - No cystic structure found in the popliteal fossa.  *See table(s) above for measurements and observations. Electronically signed by Irvin Mantel on 02/18/2024 at 10:21:28 AM.    Final    CT CHEST ABDOMEN PELVIS W CONTRAST Result Date: 02/17/2024 CLINICAL DATA:  Metastatic disease evaluation EXAM: CT CHEST, ABDOMEN, AND PELVIS WITH CONTRAST TECHNIQUE: Multidetector CT imaging of the chest, abdomen and pelvis was performed following the standard protocol during bolus administration of intravenous contrast. RADIATION DOSE REDUCTION: This exam was performed according to the departmental dose-optimization program which includes automated exposure control, adjustment of the mA and/or kV according to patient size and/or use of iterative reconstruction technique. CONTRAST:  75mL OMNIPAQUE  IOHEXOL  350 MG/ML SOLN COMPARISON:  Chest radiographs 02/17/2024; CTA chest 07/08/2023; CT abdomen pelvis 07/26/2022 FINDINGS: CT CHEST FINDINGS Cardiovascular: No pericardial effusion. Normal heart size. Aortic and coronary artery atherosclerotic calcification. Left chest wall pacemaker. Right chest wall Port-A-Cath tip in the right atrium. Mediastinum/Nodes: 9 mm anterior pericardial node on series 3/image 41. 1.1 cm right paratracheal node on series 3/image 17. 1.4 cm subcarinal node on series 3/image 23. These have increased compared to 07/26/2022 when they were normal in size. Soft tissue thickening along the right mainstem bronchus and right upper and middle lobe bronchi. Unremarkable esophagus. Lungs/Pleura: Chronic post treatment change in the right apex. Increased right upper lobe collapse and consolidation. Chronic gas and fluid collection in the right apex with air-fluid level. This appears to  communicate with the tracheal bronchial tree (series 3/image 19). Masslike consolidation in the right  upper lobe has increased compared to 07/08/2023 now measuring 6.3 x 5.2 cm, previously 2.7 x 1.8 cm. Increased size and number of multiple pulmonary nodules bilaterally. For example 2.1 cm nodule in the posteromedial right lower lobe on series 5/image 82 previously measured 0.9 cm. 1.6 cm nodule in the left upper lobe on series 5/image 51 previously measured 0.9 cm. 1.7 cm nodule in the posterior left lower lobe on series 5/image 117 is new. Trace left pleural effusion. Emphysema. Musculoskeletal: No acute fracture or destructive osseous lesion. CT ABDOMEN PELVIS FINDINGS Hepatobiliary: Cholecystectomy. No focal hepatic lesion. No biliary dilation. Pancreas: Unremarkable. Spleen: Unremarkable. Adrenals/Urinary Tract: Normal adrenal glands. No urinary calculi or hydronephrosis. Unremarkable bladder. Stomach/Bowel: Normal caliber large and small bowel. Stomach is within normal limits. No bowel wall thickening. Vascular/Lymphatic: Aortic atherosclerosis. No enlarged abdominal or pelvic lymph nodes. Reproductive: No acute abnormality. Other: No free intraperitoneal fluid or air. Musculoskeletal: No acute fracture or destructive osseous lesion. IMPRESSION: 1. Increased masslike consolidation in the right upper lobe concerning for progression of disease/occurrence. 2. Worsening pulmonary and nodal metastases. 3. Chronic gas and fluid collection in the right apex with air-fluid level and bronchopleural fistula. 4. No evidence of metastatic disease in the abdomen or pelvis. Aortic Atherosclerosis (ICD10-I70.0) and Emphysema (ICD10-J43.9). Electronically Signed   By: Rozell Cornet M.D.   On: 02/17/2024 21:49   CT Head Wo Contrast Result Date: 02/17/2024 CLINICAL DATA:  Trauma, fall. EXAM: CT HEAD WITHOUT CONTRAST CT CERVICAL SPINE WITHOUT CONTRAST TECHNIQUE: Multidetector CT imaging of the head and cervical spine was  performed following the standard protocol without intravenous contrast. Multiplanar CT image reconstructions of the cervical spine were also generated. RADIATION DOSE REDUCTION: This exam was performed according to the departmental dose-optimization program which includes automated exposure control, adjustment of the mA and/or kV according to patient size and/or use of iterative reconstruction technique. COMPARISON:  CT head 03/28/2021. FINDINGS: CT HEAD FINDINGS Brain: No acute intracranial hemorrhage. There is vasogenic edema within the left occipital lobe extending into the posterior left temporal lobe. Suggestion of a heterogeneous 1.2 cm masslike lesion within this region. Additional edema within the left cerebellar hemisphere with an associated 1.4 cm lesion. Edema extends into the cerebellar vermis with associated mass effect and partial effacement of the fourth ventricle. There is also partial effacement of the quadrigeminal cistern more pronounced on the left. Additional foci of vasogenic edema in the left parietal lobe and anterior right frontal lobe. Partial effacement of the left occipital and temporal horns. No hydrocephalus. No midline shift. Vascular: No hyperdense vessel or unexpected calcification. Skull: Normal. Negative for fracture or focal lesion. Sinuses/Orbits: No acute finding. Orbits are symmetric. Mucosal thickening and mucous retention cysts in the maxillary sinuses. Other: None. CT CERVICAL SPINE FINDINGS Alignment: Cervical lordosis is maintained. Trace stepwise retrolisthesis of C4 on C5, C5 on C6, and C6 on C7. No facet subluxation or dislocation. Skull base and vertebrae: No compression fracture or displaced fracture in the cervical spine. Soft tissues and spinal canal: No prevertebral fluid or swelling. No visible canal hematoma. Disc levels: Mild disc space narrowing at multiple levels. There are small disc bulges throughout the cervical spine. Mild spinal canal stenosis at C4-5.  Facet arthrosis at multiple levels most pronounced on the left at C3-4. No high-grade osseous foraminal narrowing. Upper chest: Complete opacification of the partially visualized right lung apex. Right-sided central venous catheter. Emphysema in the left lung apex. Other: None. IMPRESSION: Vasogenic edema in the left occipital lobe and left  cerebellum with associated mass lesions. Additional vasogenic edema in the left parietal lobe and anterior right frontal lobe. Findings concerning for metastatic disease. Recommend contrast enhanced MRI brain for further evaluation. Edema in the cerebellum results in partial effacement of the fourth ventricle and quadrigeminal cistern. No hydrocephalus. No acute intracranial hemorrhage. No acute fracture or traumatic malalignment of the cervical spine. Complete opacification of the right lung apex. Emphysema in the left lung apex. Electronically Signed   By: Denny Flack M.D.   On: 02/17/2024 15:42   CT Cervical Spine Wo Contrast Result Date: 02/17/2024 CLINICAL DATA:  Trauma, fall. EXAM: CT HEAD WITHOUT CONTRAST CT CERVICAL SPINE WITHOUT CONTRAST TECHNIQUE: Multidetector CT imaging of the head and cervical spine was performed following the standard protocol without intravenous contrast. Multiplanar CT image reconstructions of the cervical spine were also generated. RADIATION DOSE REDUCTION: This exam was performed according to the departmental dose-optimization program which includes automated exposure control, adjustment of the mA and/or kV according to patient size and/or use of iterative reconstruction technique. COMPARISON:  CT head 03/28/2021. FINDINGS: CT HEAD FINDINGS Brain: No acute intracranial hemorrhage. There is vasogenic edema within the left occipital lobe extending into the posterior left temporal lobe. Suggestion of a heterogeneous 1.2 cm masslike lesion within this region. Additional edema within the left cerebellar hemisphere with an associated 1.4 cm  lesion. Edema extends into the cerebellar vermis with associated mass effect and partial effacement of the fourth ventricle. There is also partial effacement of the quadrigeminal cistern more pronounced on the left. Additional foci of vasogenic edema in the left parietal lobe and anterior right frontal lobe. Partial effacement of the left occipital and temporal horns. No hydrocephalus. No midline shift. Vascular: No hyperdense vessel or unexpected calcification. Skull: Normal. Negative for fracture or focal lesion. Sinuses/Orbits: No acute finding. Orbits are symmetric. Mucosal thickening and mucous retention cysts in the maxillary sinuses. Other: None. CT CERVICAL SPINE FINDINGS Alignment: Cervical lordosis is maintained. Trace stepwise retrolisthesis of C4 on C5, C5 on C6, and C6 on C7. No facet subluxation or dislocation. Skull base and vertebrae: No compression fracture or displaced fracture in the cervical spine. Soft tissues and spinal canal: No prevertebral fluid or swelling. No visible canal hematoma. Disc levels: Mild disc space narrowing at multiple levels. There are small disc bulges throughout the cervical spine. Mild spinal canal stenosis at C4-5. Facet arthrosis at multiple levels most pronounced on the left at C3-4. No high-grade osseous foraminal narrowing. Upper chest: Complete opacification of the partially visualized right lung apex. Right-sided central venous catheter. Emphysema in the left lung apex. Other: None. IMPRESSION: Vasogenic edema in the left occipital lobe and left cerebellum with associated mass lesions. Additional vasogenic edema in the left parietal lobe and anterior right frontal lobe. Findings concerning for metastatic disease. Recommend contrast enhanced MRI brain for further evaluation. Edema in the cerebellum results in partial effacement of the fourth ventricle and quadrigeminal cistern. No hydrocephalus. No acute intracranial hemorrhage. No acute fracture or traumatic  malalignment of the cervical spine. Complete opacification of the right lung apex. Emphysema in the left lung apex. Electronically Signed   By: Denny Flack M.D.   On: 02/17/2024 15:42   DG Foot Complete Left Result Date: 02/17/2024 CLINICAL DATA:  Pain after fall. EXAM: LEFT FOOT - COMPLETE 3+ VIEW COMPARISON:  07/07/2023 FINDINGS: There is no evidence of fracture or dislocation. Again seen degenerative changes of the first metatarsal phalangeal joint and dorsal midfoot. Soft tissues are unremarkable. IMPRESSION: No  fracture or dislocation of the left foot. Electronically Signed   By: Chadwick Colonel M.D.   On: 02/17/2024 15:32   DG Ribs Unilateral W/Chest Left Result Date: 02/17/2024 CLINICAL DATA:  Pain after fall. EXAM: LEFT RIBS AND CHEST - 3+ VIEW COMPARISON:  Chest radiograph 10/09/2021 FINDINGS: The chest view is coned and excludes the right lateral aspect of the hemithorax. No acute left rib fracture. No evidence of rib lesion or bone destruction. Post treatment related changes at the right lung apex with chronic apical opacity. Right chest port remains in place. Left-sided pacemaker remains in place. The left lung is hyperinflated. No pneumothorax or pleural effusion. Stable heart size and mediastinal contours. IMPRESSION: 1. No acute left rib fracture or pulmonary complication related to fall. 2. Chronic and stable post treatment related changes at the right lung apex. Electronically Signed   By: Chadwick Colonel M.D.   On: 02/17/2024 15:31   DG Tibia/Fibula Left Result Date: 02/17/2024 CLINICAL DATA:  Pain after fall. EXAM: LEFT TIBIA AND FIBULA - 2 VIEW COMPARISON:  None Available. FINDINGS: There is no evidence of fracture or other focal bone lesions. Cortical margins of the tibia and fibula are intact. Knee and ankle alignment are maintained. Small surgical clips in the posteromedial soft tissues. IMPRESSION: Negative radiographs of the left tibia and fibula. Electronically Signed   By:  Chadwick Colonel M.D.   On: 02/17/2024 15:30      Subjective: Denies complaints.  Aware that he is going home today.  Had yet to go down for XRT when I saw him this morning.  Discharge Exam:  Vitals:   02/23/24 1337 02/23/24 2315 02/24/24 0536 02/24/24 1355  BP: 98/65 93/67 93/61  106/71  Pulse: 90 76 73 89  Resp: 16 18 18 16   Temp: 97.7 F (36.5 C) 98.2 F (36.8 C) 98.5 F (36.9 C) 98.5 F (36.9 C)  TempSrc: Oral Oral Oral Oral  SpO2: 99% 97% 93% 99%  Weight:      Height:       Exam stable without change for last couple of days. General exam: Middle-age male, moderately built and frail lying comfortably propped up in bed without distress.  Appears to be in great spirits. Respiratory system: Clear to auscultation. Respiratory effort normal. Cardiovascular system: S1 & S2 heard, RRR. No JVD, murmurs, rubs, gallops or clicks. No pedal edema. Gastrointestinal system: Abdomen is nondistended, soft and nontender. No organomegaly or masses felt. Normal bowel sounds heard. Central nervous system: Alert and oriented. No focal neurological deficits. Extremities: Symmetric 5 x 5 power.  Healed right BKA. Skin: No rashes, lesions or ulcers Psychiatry: Judgement and insight appear normal. Mood & affect appropriate.    The results of significant diagnostics from this hospitalization (including imaging, microbiology, ancillary and laboratory) are listed below for reference.     Microbiology: Recent Results (from the past 240 hours)  Blood culture (routine x 2)     Status: None   Collection Time: 02/17/24  4:18 PM   Specimen: BLOOD  Result Value Ref Range Status   Specimen Description BLOOD LEFT ANTECUBITAL  Final   Special Requests   Final    BOTTLES DRAWN AEROBIC ONLY Blood Culture results may not be optimal due to an inadequate volume of blood received in culture bottles   Culture   Final    NO GROWTH 5 DAYS Performed at [facility], [address], [place], [place]  [address]    Report Status 02/22/2024 FINAL  Final  Blood culture (routine x 2)     Status: None   Collection Time: 02/17/24  4:18 PM   Specimen: BLOOD  Result Value Ref Range Status   Specimen Description BLOOD LEFT ANTECUBITAL  Final   Special Requests   Final    BOTTLES DRAWN AEROBIC ONLY Blood Culture results may not be optimal due to an inadequate volume of blood received in culture bottles   Culture   Final    NO GROWTH 5 DAYS Performed at [facility], [address], [place], [place] [address]    Report Status 02/22/2024 FINAL  Final     Labs: CBC: Recent Labs  Lab 02/17/24 1553 02/18/24 1114  WBC 13.1* 9.4  NEUTROABS 10.8*  --   HGB 12.2* 13.0  HCT 37.7* 40.1  MCV 84.9 83.7  PLT 416* 405*    Basic Metabolic Panel: Recent Labs  Lab 02/17/24 1553 02/18/24 1114 02/20/24 0500 02/22/24 1000  NA 133* 132* 132* 130*  K 3.6 4.2 4.0 4.3  CL 96* 95* 97* 94*  CO2 27 25 27 26   GLUCOSE 92 114* 141* 145*  BUN <5* 5* 15 18  CREATININE 0.65 0.71 0.68 0.88  CALCIUM  8.2* 8.8* 8.4* 8.5*  MG  --   --  1.7 2.5*    Liver Function Tests: Recent Labs  Lab 02/17/24 1553 02/18/24 1114  AST 14* 15  ALT 8 10  ALKPHOS 82 84  BILITOT 0.6 0.9  PROT 5.7* 6.1*  ALBUMIN  2.8* 2.8*    CBG: Recent Labs  Lab 02/19/24 1746  GLUCAP 207*   Discussed in detail with patient's daughter via phone.  She is aware that patient was supposed to discharge today.  However she tells me that home health agency called her stating that they will not be able to deliver the hospital bed until Monday 6/2.  She states that she and her husband will not be able to accommodate patient at home until a bed is delivered.  Patient cannot return to his home due to broken toilet, bedbug infestation, no ramp and hence daughter is taking him home with her.  Updated TOC and requested that they try to expedite DME so patient can be discharged at the earliest, if not today then at least tomorrow.  Time  coordinating discharge: 50 minutes  SIGNED:  Aubrey Blas, MD,  FACP, [facility], [facility], [facility]   [facility] [place]     To contact the attending provider between 7A-7P or the covering provider during after hours 7P-7A, please log into the web site [email or web address] and access using universal [place] password for that web site. If you do not have the password, please call the hospital operator.

## 2024-02-24 NOTE — Plan of Care (Signed)

## 2024-02-24 NOTE — Progress Notes (Signed)
 Occupational Therapy Treatment Patient Details Name: [name] MRN: [id number] DOB: [birth year] Today's Date: 02/24/2024   History of present illness Pt is a 63 y.o. male who presented 02/17/24 with L leg pain s/p fall x1 day PTA. Pt was found to have lesions in the brain on CT head, concerning for metastases. Transferred to [facility] from [facility] for radiation oncology eval. PMH: R BKA, non-small cell lung cancer, hypothyroidism, chronic systolic CHF, CAD with PCI to LAD in 2017, hepatitis C, STEMI, AICD, asthma, COPD, DVT, PAD   OT comments  Pt seen for OT tx this date, he plans to discharge home with family support. Found to have soiled gown and bed linens. Performs bed mobility with supervision, CGA - supervision for step pivot transfers using RW and hopping pattern, and UB/LB bathing seated EOB, able to stand for pericare with CGA and unilateral support on RW. Pt making good progress towards goals, OT will continue to follow. If pt unable to attend STR, recommending continued [facility] OT services.       If plan is discharge home, recommend the following:  A little help with walking and/or transfers;A little help with bathing/dressing/bathroom;Direct supervision/assist for medications management;Help with stairs or ramp for entrance;Supervision due to cognitive status   Equipment Recommendations  BSC/3in1;Hospital bed       Precautions / Restrictions Precautions Precautions: Fall Recall of Precautions/Restrictions: Intact Precaution/Restrictions Comments: has prosthesis; watch BP Required Braces or Orthoses: Other Brace Other Brace: R BKA prosthesis Restrictions Weight Bearing Restrictions Per Provider Order: No       Mobility Bed Mobility Overal bed mobility: Needs Assistance Bed Mobility: Supine to Sit Rolling: Supervision Sidelying to sit: Supervision       General bed mobility comments: no physical assist    Transfers Overall transfer level: Needs assistance Equipment used: Rolling  walker (2 wheels) Transfers: Sit to/from Stand Sit to Stand: Supervision     Step pivot transfers: Contact guard assist     General transfer comment: completes without prosthesis as pt states he typically does not wear     Balance Overall balance assessment: Needs assistance Sitting-balance support: No upper extremity supported, Feet supported Sitting balance-Leahy Scale: Good     Standing balance support: Bilateral upper extremity supported, Reliant on assistive device for balance Standing balance-Leahy Scale: Poor Standing balance comment: Reliant on RW                           ADL either performed or assessed with clinical judgement   ADL Overall ADL's : Needs assistance/impaired     Grooming: Set up;Sitting;Wash/dry hands;Wash/dry face Grooming Details (indicate cue type and reason): sitting EOB Upper Body Bathing: Sitting;Set up   Lower Body Bathing: Sit to/from stand;Contact guard assist Lower Body Bathing Details (indicate cue type and reason): CGA for standing balance safety Upper Body Dressing : Sitting;Minimal assistance Upper Body Dressing Details (indicate cue type and reason): doff and don gown     Toilet Transfer: Contact guard assist;Ambulation;Rolling walker (2 wheels) Toilet Transfer Details (indicate cue type and reason): Simulated in room Toileting- Clothing Manipulation and Hygiene: Contact guard assist;Sit to/from stand       Functional mobility during ADLs: Contact guard assist;Rolling walker (2 wheels) General ADL Comments: Good flexibility and mobility, decreased balance. Motivated and willing to participate, no impulsivity noted this session.        Communication Communication Communication: No apparent difficulties   Cognition Arousal: Alert Behavior During Therapy: [facility] for  tasks assessed/performed Cognition: History of cognitive impairments             OT - Cognition Comments: Pt with poor STM but very pleasant,  cooperative                 Following commands: Intact        Cueing   Cueing Techniques: Verbal cues, Visual cues, Gestural cues        General Comments Pt noted to have bed bugs yesterday, no live bugs observed today. Pt found with wet gown and bed sheets, OT assists with full bed change and ADL session    Pertinent Vitals/ Pain       Pain Assessment Pain Assessment: No/denies pain         Frequency  Min 2X/week        Progress Toward Goals  OT Goals(current goals can now be found in the care plan section)  Progress towards OT goals: Progressing toward goals  Acute Rehab OT Goals OT Goal Formulation: With patient/family Time For Goal Achievement: 03/05/24 Potential to Achieve Goals: Good ADL Goals Pt Will Perform Grooming: with supervision;standing Pt Will Perform Lower Body Dressing: with supervision;sit to/from stand Pt Will Transfer to Toilet: with supervision;ambulating;bedside commode Pt Will Perform Toileting - Clothing Manipulation and hygiene: with supervision;sit to/from stand  Plan         AM-PAC OT "6 Clicks" Daily Activity     Outcome Measure   Help from another person eating meals?: None Help from another person taking care of personal grooming?: A Little Help from another person toileting, which includes using toliet, bedpan, or urinal?: A Little Help from another person bathing (including washing, rinsing, drying)?: A Little Help from another person to put on and taking off regular upper body clothing?: A Little Help from another person to put on and taking off regular lower body clothing?: A Little 6 Click Score: 19    End of Session Equipment Utilized During Treatment: Rolling walker (2 wheels);Gait belt  OT Visit Diagnosis: Repeated falls (R29.6);Muscle weakness (generalized) (M62.81);History of falling (Z91.81)   Activity Tolerance Patient tolerated treatment well   Patient Left in bed;with call bell/phone within reach;with bed  alarm set   Nurse Communication Mobility status        Time: 1610-9604 OT Time Calculation (min): 27 min  Charges: OT General Charges $OT Visit: 1 Visit OT Treatments $Self Care/Home Management : 23-37 mins Kennis Buell L. Ting Cage, OTR/L  02/24/24, 9:58 AM

## 2024-02-24 NOTE — Progress Notes (Signed)
 Per patient flow sheet, patient's port needle was scheduled for change today, 5/30. However, per provider note dated on 5/29, patient is anticipated to discharge home today. Therefore, port needle change was deferred. If patient remains inpatient, day shift staff to follow up with port needle change as indicated.

## 2024-02-24 NOTE — Discharge Instructions (Signed)

## 2024-02-25 DIAGNOSIS — C3490 Malignant neoplasm of unspecified part of unspecified bronchus or lung: Secondary | ICD-10-CM | POA: Diagnosis not present

## 2024-02-25 DIAGNOSIS — C7931 Secondary malignant neoplasm of brain: Secondary | ICD-10-CM | POA: Diagnosis not present

## 2024-02-25 MED ORDER — HALOPERIDOL LACTATE 5 MG/ML IJ SOLN
2.0000 mg | Freq: Once | INTRAMUSCULAR | Status: DC
  Filled 2024-02-25: qty 1

## 2024-02-25 MED ORDER — MELATONIN 5 MG PO TABS
5.0000 mg | ORAL_TABLET | Freq: Every evening | ORAL | Status: DC | PRN
  Administered 2024-02-26: 5 mg via ORAL
  Filled 2024-02-25: qty 1

## 2024-02-25 NOTE — Progress Notes (Signed)
 TRH Progress note  Patient was discharged yesterday 5/30 but unable to leave because family was trying to reorganize their home so that the hospital bed could be delivered and the earliest that they would be able to except this would be on 6/2.  Therefore discharge was canceled.  For details of current hospitalization, refer to DC summary by this MD on 5/30  Interviewed and examined patient this morning.  Denies complaints.  Specifically no pain.  Vital signs stable.  Sitting up in bed comfortably without distress.  Stable and unchanged exam compared to last couple of days  No new labs to review.  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  Patient will be discharging to his daughters place after hospital bed has been delivered on 6/2. Rest of medical problems as per DC summary  Aubrey Blas, MD,  FACP, [facility], [facility], [facility]   [facility] [place]     To contact the attending provider between 7A-7P or the covering provider during after hours 7P-7A, please log into the web site [email or web address] and access using universal [place] password for that web site. If you do not have the password, please call the hospital operator.

## 2024-02-25 NOTE — Plan of Care (Signed)
   Problem: Education: Goal: Knowledge of General Education information will improve Description Including pain rating scale, medication(s)/side effects and non-pharmacologic comfort measures Outcome: Progressing   Problem: Health Behavior/Discharge Planning: Goal: Ability to manage health-related needs will improve Outcome: Progressing

## 2024-02-26 DIAGNOSIS — C3490 Malignant neoplasm of unspecified part of unspecified bronchus or lung: Secondary | ICD-10-CM | POA: Diagnosis not present

## 2024-02-26 DIAGNOSIS — C7931 Secondary malignant neoplasm of brain: Secondary | ICD-10-CM | POA: Diagnosis not present

## 2024-02-26 MED ORDER — ALUM & MAG HYDROXIDE-SIMETH 200-200-20 MG/5ML PO SUSP
30.0000 mL | Freq: Four times a day (QID) | ORAL | Status: DC | PRN
  Administered 2024-02-26: 30 mL via ORAL
  Filled 2024-02-26: qty 30

## 2024-02-26 NOTE — Progress Notes (Addendum)
 TRH Progress note  Patient was discharged yesterday 5/30 but unable to leave because family was trying to reorganize their home so that the hospital bed could be delivered and the earliest that they would be able to accept the bed delivery would be on 6/2.  Therefore discharge was canceled.  For details of current hospitalization, refer to DC summary by this MD on 5/30  Interviewed and examined patient this morning.  Denies complaints.  Specifically no pain.  Vital signs and exam remained stable for the last several days.  No new labs to review.  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  Patient will be discharging to his daughters place after hospital bed has been delivered on 6/2. Rest of medical problems as per DC summary  Bedbug infestation A bedbug was found in patient's room a couple days ago by patient's granddaughter.  Therefore patient is on contact isolation.  Aubrey Blas, MD,  FACP, [facility], [facility], [facility]   [facility] [place]     To contact the attending provider between 7A-7P or the covering provider during after hours 7P-7A, please log into the web site [email or web address] and access using universal [place] password for that web site. If you do not have the password, please call the hospital operator.

## 2024-02-27 ENCOUNTER — Other Ambulatory Visit: Payer: Self-pay

## 2024-02-27 ENCOUNTER — Ambulatory Visit
Admission: RE | Admit: 2024-02-27 | Discharge: 2024-02-27 | Disposition: A | Discharge status: Home / Self Care | Source: Ambulatory Visit | Attending: Radiation Oncology | Admitting: Radiation Oncology

## 2024-02-27 DIAGNOSIS — C7931 Secondary malignant neoplasm of brain: Secondary | ICD-10-CM | POA: Insufficient documentation

## 2024-02-27 DIAGNOSIS — Z510 Encounter for antineoplastic radiation therapy: Secondary | ICD-10-CM | POA: Insufficient documentation

## 2024-02-27 DIAGNOSIS — C3490 Malignant neoplasm of unspecified part of unspecified bronchus or lung: Secondary | ICD-10-CM | POA: Diagnosis not present

## 2024-02-27 DIAGNOSIS — C3491 Malignant neoplasm of unspecified part of right bronchus or lung: Secondary | ICD-10-CM | POA: Insufficient documentation

## 2024-02-27 LAB — RAD ONC ARIA SESSION SUMMARY
Course Elapsed Days: 4
Plan Fractions Treated to Date: 3
Plan Prescribed Dose Per Fraction: 3 Gy
Plan Total Fractions Prescribed: 10
Plan Total Prescribed Dose: 30 Gy
Reference Point Dosage Given to Date: 9 Gy
Reference Point Session Dosage Given: 3 Gy
Session Number: 3

## 2024-02-27 MED ORDER — HEPARIN SOD (PORK) LOCK FLUSH 100 UNIT/ML IV SOLN
500.0000 [IU] | Freq: Once | INTRAVENOUS | Status: AC
  Administered 2024-02-27: 500 [IU] via INTRAVENOUS
  Filled 2024-02-27: qty 5

## 2024-02-27 NOTE — Plan of Care (Signed)
  Problem: Education: Goal: Knowledge of General Education information will improve Description: Including pain rating scale, medication(s)/side effects and non-pharmacologic comfort measures Outcome: Progressing   Problem: Health Behavior/Discharge Planning: Goal: Ability to manage health-related needs will improve Outcome: Progressing   Problem: Pain Managment: Goal: General experience of comfort will improve and/or be controlled Outcome: Progressing   Problem: Safety: Goal: Ability to remain free from injury will improve Outcome: Progressing   Problem: Skin Integrity: Goal: Risk for impaired skin integrity will decrease Outcome: Progressing

## 2024-02-27 NOTE — TOC Transition Note (Signed)
 Transition of Care [facility]) - Discharge Note   Patient Details  Name: [name] MRN: [id number] Date of Birth: Feb 09, 1961  [facility]) CM/SW Contact:  Jonni Nettle, LCSW Phone Number: 02/27/2024, 1:55 PM   Clinical Narrative:    Pt to receive [facility] PT/OT services with [facility] CSW confirmed with pt's daughter, Odilia Bennett, that hospital bed was delivered to her home. Pt's family to transport pt home upon discharge. No further TOC needs at this time.   Final next level of care: Home w Home Health Services Barriers to Discharge: Barriers Resolved   Patient Goals and CMS Choice Patient states their goals for this hospitalization and ongoing recovery are:: To return home CMS Medicare.gov Compare Post Acute Care list provided to:: Patient Choice offered to / list presented to : Patient      Discharge Placement Home with [facility] services  Discharge Plan and Services Additional resources added to the After Visit Summary for [facility] for [facility] services and [employer] for DME   Discharge Planning Services: CM Consult Post Acute Care Choice: Home Health (outpatient palliative care)          DME Arranged: Hospital bed DME Agency: [employer] Date DME Agency Contacted: 02/24/24 Time DME Agency Contacted: 1354 Representative spoke with at DME Agency: Zula Hitch HH Arranged: PT, OT HH Agency: [facility] Date [facility] Agency Contacted: 02/24/24 Time HH Agency Contacted: 1354 Representative spoke with at [facility] Agency: Cindie  Social Drivers of Health (SDOH) Interventions SDOH Screenings   Food Insecurity: No Food Insecurity (02/19/2024)  Housing: Low Risk  (02/19/2024)  Transportation Needs: No Transportation Needs (02/19/2024)  Utilities: Not At Risk (02/19/2024)  Tobacco Use: High Risk (02/17/2024)     Readmission Risk Interventions     No data to display          [place], MSW, LCSW 02/27/2024 1:58 PM

## 2024-02-27 NOTE — Progress Notes (Signed)
 PT Cancellation Note  Patient Details Name: [name] MRN: [id number] DOB: 02-21-1961   Cancelled Treatment:    Reason Eval/Treat Not Completed: Patient declined, no reason specified. Pt declines therapy, reports ready to d/c home, family at bedside reports d/c summary from MD was just posted and they're hoping to leave soon.   Judyann Number PT, DPT 02/27/24, 1:46 PM

## 2024-02-27 NOTE — Discharge Summary (Signed)
 Physician Discharge Summary  [name] NFA:[id number] DOB: 07-23-1961  PCP: [name], MD  Admitted from: Home Discharged to: Home  Admit date: 02/17/2024 Discharge date: 02/27/2024  Recommendations for Outpatient Follow-up:    Follow-up Information     Care, [facility] Follow up.   Specialty: Home Health Services Contact information: [address] [place] [address] [phone]         [facility] Follow up.   Contact information: [address]   [address] [phone]        Johna Myers, MD Follow up.   Specialty: Radiation Oncology Why: Continue follow-up for daily radiation treatment. Contact information: [address] [place] [address] [phone]         [name], MD. Schedule an appointment as soon as possible for a visit in 2 week(s).   Specialty: Internal Medicine Why: To be seen with repeat labs (CBC & BMP) Contact information: [address] [place] [address] [phone]         Marlene Simas, MD. Schedule an appointment as soon as possible for a visit.   Specialty: Oncology Contact information: [address] [place] [address] [phone]                  Home Health: Home Health Orders (From admission, onward)     Start     Ordered   02/23/24 1258  Home Health  At discharge       Question Answer Comment  To provide the following care/treatments PT   To provide the following care/treatments OT   To provide the following care/treatments RN      02/23/24 1301   02/20/24 1115  Home Health  At discharge       Question:  To provide the following care/treatments  Answer:  PT   02/20/24 1115             Equipment/Devices:     Durable Medical Equipment  (From admission, onward)           Start     Ordered   02/23/24 1256  For home use only DME 3 n 1  Once        02/23/24 1257   02/23/24 1256  For home use  only DME Hospital bed  Once       Question Answer Comment  Length of Need Lifetime   Bed type Semi-electric      02/23/24 1257             Discharge Condition: Improved and stable.   Code Status: Full Code Diet recommendation:     Discharge Diagnoses:  Principal Problem:   Imbalance Active Problems:   CAD (coronary artery disease)   Non-small cell carcinoma of right lung, stage 3 (HCC)   Hypothyroidism   HFrEF (heart failure with reduced ejection fraction) (HCC)   S/P implantation of automatic cardioverter/defibrillator (AICD)   Lung cancer metastatic to brain [facility])   Counseling regarding advance care planning and goals of care   Brief Hospital Course:   63 y.o. Mark with a past medical history of non-small cell lung cancer, hypothyroidism, chronic systolic CHF, coronary artery disease with PCI to LAD in 2017, who was trying to go up the ramp at his house using a walker when he felt his left leg to give out and he as result fell. He has a history of right below-knee amputation as well. Patient experienced persistent pain in his  left leg as a result of his fall and decided to come into the hospital for evaluation. Evaluation in the ED raised concern for metastatic disease in his brain. Cerebral edema was noted. He was subsequently hospitalized. He does not remember the last time he was treated for his cancer. Based on EMR he has not been seen by Dr. Marguerita Shih since 2023.  MRI brain confirmed multiple brain mets.  Transferred from [facility] to [facility] for XRT. Unable to get SNF with daily transport to XRT and patient/family have opted to DC home 5/30 and they will bring him for outpatient XRT.  TOC arranging DME's.  Patient was discharged on 5/30 but unable to leave because family was trying to reorganize their home so that the hospital bed could be delivered and the earliest that they would be able to accept the bed delivery would be on 6/2.  Patient will be staying with his daughter after  discharge and not returning to his home.  Therefore discharge was canceled until 6/2.      Assessment & Plan:    Non-small cell lung cancer now with metastases/brain mets CT of the chest abdomen pelvis shows increase in the size of the right lung mass.  Other chronic changes were noted.  No evidence for metastatic process in the abdominal cavity. He does have brain lesions based on CT head which is new.  Vasogenic edema was noted.  Patient was started on dexamethasone .   MRI brain showed widespread brain metastasis (18 enhancing metastasis ranging from punctate up to 24 mm).  Multifocal vasogenic edema, most pronounced in the left cerebellum with some posterior for some mass effect but no ventriculomegaly, midline shift or other complicating features Patient has previously been seen by Dr. Marguerita Shih.  However has not been seen since 2023.   Started on Keppra  due to concern for seizure activity prior to admission.  EEG did not show any epileptiform activity.   Patient was transferred from [facility] to [facility] for radiation oncology evaluation for whole brain RT.  It appears that he went down for planning today and starting XRT on 5/29.  No lung radiation planned. Dr. Marguerita Shih medical oncologist input appreciated.  He will arrange outpatient follow-up in several weeks for consideration of systemic chemotherapy. As communicated extensively with TOC and with Dr. Jeryl Moris, Radiation oncology, plan is for DC home on current dose of Decadron  4 mg 3 times daily and radiation oncology will taper dose as appropriate.   Hyponatremia ?  SIADH.  Serum sodium stable in the low 130s.  It could also be due to meds/Celexa .  Periodically follow BMP as outpatient.   Chronic systolic CHF/ICD in situ Last echocardiogram from 2023 showed LVEF of 30 to 35%.  No evidence for volume overload currently.  Lasix  discontinued from home med list at discharge. Echocardiogram done during this admission shows that the LVEF is about the  same. Continue to monitor. Has a defibrillator in situ.  Clinically euvolemic.   Hypotension Seems to be chronic since he is on midodrine  prior to admission.  This is being continued.  Blood pressure soft but stable.  He remains asymptomatic. Also did not resume Flomax  that he was on PTA given that he was not getting this in the hospital and was asymptomatic of urinary symptoms.   Coronary artery disease Has a stent to LAD which was placed in 2017.  No anginal symptoms. He was not taking aspirin  and statins PTA, resumed/reordered given his history of CAD.   Gait ataxia  most likely due to brain lesion He fell as a result of his imbalance.  Right BKA complicating gait as well. Imbalance most likely due to brain metastases. Seen by physical therapy.  SNF recommended.  TOC consulted for same. Patient does have right BKA and uses prosthesis at home.  Uses a walker to ambulate. As detailed in [facility] note, unable to get SNF where able to transport patient for daily XRT and patient/family have thereby opted to DC home.   Hypothyroidism Continue levothyroxine .   Status post right BKA Uses a prosthesis at home.    Stage III pressure injury coccyx Seen by wound care nurse.  Continue management per their recommendations.   Tobacco abuse Cessation counseled.  Continue nicotine  patch.  Bedbug infestation A bedbug was found in patient's room a couple days ago by patient's granddaughter.  Therefore patient is on contact isolation.   Body mass index is 21.11 kg/m.     Consultants:   Medical oncology Radiation oncology   Procedures:       Discharge Instructions  Discharge Instructions     (HEART FAILURE PATIENTS) Call MD:  Anytime you have any of the following symptoms: 1) 3 pound weight gain in 24 hours or 5 pounds in 1 week 2) shortness of breath, with or without a dry hacking cough 3) swelling in the hands, feet or stomach 4) if you have to sleep on extra pillows at night in order to  breathe.   Complete by: As directed    Call MD for:  difficulty breathing, headache or visual disturbances   Complete by: As directed    Call MD for:  extreme fatigue   Complete by: As directed    Call MD for:  persistant dizziness or light-headedness   Complete by: As directed    Call MD for:  persistant nausea and vomiting   Complete by: As directed    Call MD for:  severe uncontrolled pain   Complete by: As directed    Call MD for:  temperature >100.4   Complete by: As directed    Diet general   Complete by: As directed    Discharge wound care:   Complete by: As directed    Wound care  Daily      Comments: Cleanse sacral wound with NS, apply Medihoney to wound bed daily, cover with dry gauze and silicone foam   Increase activity slowly   Complete by: As directed         Medication List     STOP taking these medications    folic acid  1 MG tablet Commonly known as: FOLVITE    furosemide  20 MG tablet Commonly known as: LASIX    lisinopril  2.5 MG tablet Commonly known as: ZESTRIL    prochlorperazine  10 MG tablet Commonly known as: COMPAZINE    sucralfate  1 g tablet Commonly known as: Carafate    tamsulosin  0.4 MG Caps capsule Commonly known as: FLOMAX        TAKE these medications    albuterol  108 (90 Base) MCG/ACT inhaler Commonly known as: VENTOLIN  HFA Inhale 1-2 puffs into the lungs every 4 (four) hours as needed for wheezing or shortness of breath.   alprazolam  2 MG tablet Commonly known as: XANAX  Take 2 mg by mouth 4 (four) times daily as needed for anxiety.   amitriptyline  100 MG tablet Commonly known as: ELAVIL  Take 100 mg by mouth at bedtime.   aspirin  EC 81 MG tablet Take 81 mg by mouth daily.   atorvastatin  80 MG  tablet Commonly known as: LIPITOR  Take 1 tablet (80 mg total) by mouth daily.   citalopram  40 MG tablet Commonly known as: CELEXA  Take 40 mg by mouth daily.   dexamethasone  4 MG tablet Commonly known as: DECADRON  Take 1 tablet  (4 mg total) by mouth 3 (three) times daily.   feeding supplement Liqd Take 237 mLs by mouth 2 (two) times daily between meals.   HYDROcodone -acetaminophen  10-325 MG tablet Commonly known as: NORCO Take 1 tablet by mouth every 4 (four) hours as needed for moderate pain.   leptospermum manuka honey Pste paste Apply 1 Application topically daily. Cleanse sacral wound with NS, apply Medihoney to wound bed daily, cover with dry gauze and silicone foam Interchangeable with TheraHoney. Apply thin layer (3 mm) to wound.   levETIRAcetam  500 MG tablet Commonly known as: KEPPRA  Take 1 tablet (500 mg total) by mouth 2 (two) times daily.   levothyroxine  175 MCG tablet Commonly known as: SYNTHROID  Take 175 mcg by mouth daily before breakfast. What changed: Another medication with the same name was removed. Continue taking this medication, and follow the directions you see here.   lidocaine -prilocaine  cream Commonly known as: EMLA  Apply 1 application topically as needed. What changed:  when to take this reasons to take this   liver oil-zinc  oxide 40 % ointment Commonly known as: DESITIN Apply topically 2 (two) times daily. Apply to a thin layer of Desitin to intact skin of buttocks 2 times daily and as needed for soiling   midodrine  10 MG tablet Commonly known as: PROAMATINE  Take 10 mg by mouth 3 (three) times daily.   nicotine  14 mg/24hr patch Commonly known as: NICODERM CQ  - dosed in mg/24 hours Place 1 patch (14 mg total) onto the skin daily.   nitroGLYCERIN  0.4 MG SL tablet Commonly known as: NITROSTAT  Place 1 tablet (0.4 mg total) under the tongue every 5 (five) minutes x 3 doses as needed for chest pain.   polyethylene glycol 17 g packet Commonly known as: MIRALAX  / GLYCOLAX  Take 17 g by mouth daily.   senna-docusate 8.6-50 MG tablet Commonly known as: Senokot-S Take 2 tablets by mouth 2 (two) times daily.        Allergies  Allergen Reactions   Lyrica [Pregabalin]  Palpitations    Chest pain and heart palps.   Neurontin [Gabapentin] Palpitations    Chest pain and heart palp      Procedures/Studies: MR Brain W and Wo Contrast Result Date: 02/21/2024 CLINICAL DATA:  63 year old Mark with recent fall. Cerebral vasogenic edema on CT suggesting metastatic disease and right upper lung masslike area. EXAM: MRI HEAD WITHOUT AND WITH CONTRAST TECHNIQUE: Multiplanar, multiecho pulse sequences of the brain and surrounding structures were obtained without and with intravenous contrast. CONTRAST:  8mL GADAVIST  GADOBUTROL  1 MMOL/ML IV SOLN COMPARISON:  Head CT 02/17/2024. Previous staging brain MRI 12/17/2020. FINDINGS: Brain: Multiple enhancing brain metastases. Eighteen separate enhancing metastases are identified ranging from punctate (series 16, image 122 left parietal lobe) up to 24 mm long axis (left cerebellar hemisphere series 16, image 48). Associated vasogenic edema with T2 and FLAIR hyperintensity in the left occipital lobe, left cerebellum tracking to the midline, left posterior parietal lobe, anterior right frontal lobe. Of these, the left cerebellar mass defect is greatest with partial effacement of the 4th ventricle. But no ventriculomegaly. No midline shift. Basilar cisterns remain patent. No dural or leptomeningeal thickening identified. No superimposed restricted diffusion to suggest acute infarction. No extra-axial collection or acute intracranial hemorrhage.  Some of the lesions have minimal associated hemosiderin on SWI. Cervicomedullary junction and pituitary are within normal limits. Vascular: Major intracranial vascular flow voids are stable since 2022, distal left vertebral artery appears dominant. Following contrast the major dural venous sinuses are enhancing and appear to be patent. Skull and upper cervical spine: Compared to 2022 visible bone marrow signal stable and within normal limits. Negative visible cervical spine and spinal cord. Sinuses/Orbits:  Negative orbits. Chronic paranasal sinus retention cysts. Other: Mild chronic mastoid fluid. Visible internal auditory structures appear normal. Negative visible scalp and face. IMPRESSION: 1. Positive for widespread Brain metastases. Eighteen enhancing metastases suspected ranging from punctate (left parietal lobe) up to 24 mm (left cerebellum). 2. Multifocal vasogenic edema associated, most pronounced in the left cerebellum with some posterior fossa mass effect, but no ventriculomegaly, midline shift, or other complicating features at this time. Electronically Signed   By: Marlise Simpers M.D.   On: 02/21/2024 11:43   ECHOCARDIOGRAM LIMITED Result Date: 02/20/2024    ECHOCARDIOGRAM LIMITED REPORT   Patient Name:   [name] Date of Exam: 02/20/2024 Medical Rec #:  [id number]   Height:       72.0 in Accession #:    [id number]  Weight:       191.8 lb Date of Birth:  Feb 06, 1961   BSA:          2.093 m Patient Age:    63 years    BP:           94/68 mmHg Patient Gender: M           HR:           79 bpm. Exam Location:  Inpatient Procedure: Limited Echo (Both Spectral and Color Flow Doppler were utilized            during procedure). Indications:    Reattempt to see if LVEF can be calculated  History:        Patient has prior history of Echocardiogram examinations, most                 recent 02/18/2024. CAD, COPD; Risk Factors:Current Smoker.  Sonographer:    Denese Finn RCS Referring Phys: [phone] [facility]  Sonographer Comments: Technically difficult study due to poor echo windows. IMPRESSIONS  1. Global hypokinesis with abnormal septal motion . Left ventricular ejection fraction, by estimation, is 30 to 35%. The left ventricle has moderately decreased function. The left ventricle demonstrates global hypokinesis. There is mild left ventricular  hypertrophy.  2. Device lead in RV. Right ventricular systolic function is normal. The right ventricular size is normal.  3. The inferior vena cava is normal in size with <50%  respiratory variability, suggesting right atrial pressure of 8 mmHg. FINDINGS  Left Ventricle: Global hypokinesis with abnormal septal motion. Left ventricular ejection fraction, by estimation, is 30 to 35%. The left ventricle has moderately decreased function. The left ventricle demonstrates global hypokinesis. There is mild left  ventricular hypertrophy. Right Ventricle: Device lead in RV. The right ventricular size is normal. No increase in right ventricular wall thickness. Right ventricular systolic function is normal. Pericardium: There is no evidence of pericardial effusion. Venous: The inferior vena cava is normal in size with less than 50% respiratory variability, suggesting right atrial pressure of 8 mmHg. Additional Comments: 3D was performed not requiring image post processing on an independent workstation and was indeterminate. A device lead is visualized.  LEFT VENTRICLE PLAX 2D LVIDd:  5.30 cm LVIDs:         4.80 cm LV PW:         1.00 cm LV IVS:        1.20 cm  LV Volumes (MOD) LV vol d, MOD A2C: 69.9 ml LV vol d, MOD A4C: 128.0 ml LV vol s, MOD A2C: 52.0 ml LV vol s, MOD A4C: 88.4 ml LV SV MOD A2C:     17.9 ml LV SV MOD A4C:     128.0 ml LV SV MOD BP:      32.2 ml LEFT ATRIUM         Index LA diam:    3.10 cm 1.48 cm/m   AORTA Ao Root diam: 3.40 cm Janelle Mediate MD Electronically signed by Janelle Mediate MD Signature Date/Time: 02/20/2024/1:23:07 PM    Final    ECHOCARDIOGRAM COMPLETE Result Date: 02/18/2024    ECHOCARDIOGRAM REPORT   Patient Name:   [name] Date of Exam: 02/18/2024 Medical Rec #:  [id number]   Height:       72.0 in Accession #:    [id number]  Weight:       191.8 lb Date of Birth:  06-20-1961   BSA:          2.093 m Patient Age:    63 years    BP:           90/61 mmHg Patient Gender: M           HR:           90 bpm. Exam Location:  Inpatient Procedure: 2D Echo, Cardiac Doppler and Color Doppler (Both Spectral and Color            Flow Doppler were utilized during  procedure). Indications:    CHF-Acute Systolic I50.21  History:        Patient has prior history of Echocardiogram examinations, most                 recent 10/08/2021. Cardiomyopathy, Acute MI and CAD, COPD,                 Signs/Symptoms:Hypotension; Risk Factors:Current Smoker.  Sonographer:    Terrilee Few RCS Referring Phys: [id number] Maylene Spear IMPRESSIONS  1. Limited study due to poor acoustic windows. LV function cannot be assessed.  2. Right ventricular systolic function is normal. The right ventricular size is normal.  3. The aortic valve is grossly normal. Aortic valve regurgitation is not visualized.  4. The inferior vena cava is normal in size with greater than 50% respiratory variability, suggesting right atrial pressure of 3 mmHg. FINDINGS  Left Ventricle: Right Ventricle: The right ventricular size is normal. Right vetricular wall thickness was not well visualized. Right ventricular systolic function is normal. Left Atrium: Left atrial size was normal in size. Tricuspid Valve: The tricuspid valve is grossly normal. Tricuspid valve regurgitation is trivial. Aortic Valve: The aortic valve is grossly normal. Aortic valve regurgitation is not visualized. Pulmonic Valve: The pulmonic valve was grossly normal. Pulmonic valve regurgitation is trivial. Venous: The inferior vena cava is normal in size with greater than 50% respiratory variability, suggesting right atrial pressure of 3 mmHg.  IVC IVC diam: 1.70 cm Grady Lawman MD Electronically signed by Grady Lawman MD Signature Date/Time: 02/18/2024/1:45:44 PM    Final    EEG adult Result Date: 02/18/2024 Eleni Griffin, MD     02/19/2024 12:41 PM Routine EEG Report [name] is a 62 y.o.  Mark with a history of altered mental status and brain tumor who is undergoing an EEG to evaluate for seizures. Report: This EEG was acquired with electrodes placed according to the International 10-20 electrode system (including Fp1, Fp2, F3, F4, C3, C4, P3,  P4, O1, O2, T3, T4, T5, T6, A1, A2, Fz, Cz, Pz). The following electrodes were missing or displaced: none. The occipital dominant rhythm was 11 Hz. This activity is reactive to stimulation. Drowsiness was manifested by background fragmentation; deeper stages of sleep were not identified. There was focal slowing over the left temporal region. There were no interictal epileptiform discharges. There were no electrographic seizures identified. Photic stimulation and hyperventilation were not performed. Impression and clinical correlation: This EEG was obtained while awake and drowsy and is abnormal due to focal slowing over the left posterior region (indicating focal cerebral dysfunction). That location corresponds to the area where CT scan showed the most significant abnormality. No epileptiform abnormalities were seen during this recording. Greg Leaks, MD [facility] [phone] If 7pm- 7am, please page neurology on call as listed in AMION.   VAS US  LOWER EXTREMITY VENOUS (DVT) (7a-7p) Result Date: 02/18/2024  Lower Venous DVT Study Patient Name:  [name]  Date of Exam:   02/17/2024 Medical Rec #: [id number]    Accession #:    [id number] Date of Birth: Oct 12, 1960    Patient Gender: M Patient Age:   51 years Exam Location:  [facility] Procedure:      VAS US  LOWER EXTREMITY VENOUS (DVT) Referring Phys: Aleatha Hunting --------------------------------------------------------------------------------  Indications: Edema.  Comparison Study: No prior studies. Performing Technologist: Lerry Ransom RVT  Examination Guidelines: A complete evaluation includes B-mode imaging, spectral Doppler, color Doppler, and power Doppler as needed of all accessible portions of each vessel. Bilateral testing is considered an integral part of a complete examination. Limited examinations for reoccurring indications may be performed as noted. The reflux portion of the exam is performed with the patient in reverse  Trendelenburg.  +-----+---------------+---------+-----------+----------+--------------+ RIGHTCompressibilityPhasicitySpontaneityPropertiesThrombus Aging +-----+---------------+---------+-----------+----------+--------------+ CFV  Full           Yes      Yes                                 +-----+---------------+---------+-----------+----------+--------------+   +---------+---------------+---------+-----------+----------+--------------+ LEFT     CompressibilityPhasicitySpontaneityPropertiesThrombus Aging +---------+---------------+---------+-----------+----------+--------------+ CFV      Full           Yes      Yes                                 +---------+---------------+---------+-----------+----------+--------------+ SFJ      Full                                                        +---------+---------------+---------+-----------+----------+--------------+ FV Prox  Full                                                        +---------+---------------+---------+-----------+----------+--------------+ FV Mid   Full                                                        +---------+---------------+---------+-----------+----------+--------------+  FV DistalFull                                                        +---------+---------------+---------+-----------+----------+--------------+ PFV      Full                                                        +---------+---------------+---------+-----------+----------+--------------+ POP      Full           Yes      Yes                                 +---------+---------------+---------+-----------+----------+--------------+ PTV      Full                                                        +---------+---------------+---------+-----------+----------+--------------+ PERO     Full                                                         +---------+---------------+---------+-----------+----------+--------------+     Summary: RIGHT: - No evidence of common femoral vein obstruction.   LEFT: - There is no evidence of deep vein thrombosis in the lower extremity.  - No cystic structure found in the popliteal fossa.  *See table(s) above for measurements and observations. Electronically signed by Irvin Mantel on 02/18/2024 at 10:21:28 AM.    Final    CT CHEST ABDOMEN PELVIS W CONTRAST Result Date: 02/17/2024 CLINICAL DATA:  Metastatic disease evaluation EXAM: CT CHEST, ABDOMEN, AND PELVIS WITH CONTRAST TECHNIQUE: Multidetector CT imaging of the chest, abdomen and pelvis was performed following the standard protocol during bolus administration of intravenous contrast. RADIATION DOSE REDUCTION: This exam was performed according to the departmental dose-optimization program which includes automated exposure control, adjustment of the mA and/or kV according to patient size and/or use of iterative reconstruction technique. CONTRAST:  75mL OMNIPAQUE  IOHEXOL  350 MG/ML SOLN COMPARISON:  Chest radiographs 02/17/2024; CTA chest 07/08/2023; CT abdomen pelvis 07/26/2022 FINDINGS: CT CHEST FINDINGS Cardiovascular: No pericardial effusion. Normal heart size. Aortic and coronary artery atherosclerotic calcification. Left chest wall pacemaker. Right chest wall Port-A-Cath tip in the right atrium. Mediastinum/Nodes: 9 mm anterior pericardial node on series 3/image 41. 1.1 cm right paratracheal node on series 3/image 17. 1.4 cm subcarinal node on series 3/image 23. These have increased compared to 07/26/2022 when they were normal in size. Soft tissue thickening along the right mainstem bronchus and right upper and middle lobe bronchi. Unremarkable esophagus. Lungs/Pleura: Chronic post treatment change in the right apex. Increased right upper lobe collapse and consolidation. Chronic gas and fluid collection in the right apex with air-fluid level. This appears to  communicate with the tracheal bronchial tree (series 3/image 19). Masslike consolidation in the right  upper lobe has increased compared to 07/08/2023 now measuring 6.3 x 5.2 cm, previously 2.7 x 1.8 cm. Increased size and number of multiple pulmonary nodules bilaterally. For example 2.1 cm nodule in the posteromedial right lower lobe on series 5/image 82 previously measured 0.9 cm. 1.6 cm nodule in the left upper lobe on series 5/image 51 previously measured 0.9 cm. 1.7 cm nodule in the posterior left lower lobe on series 5/image 117 is new. Trace left pleural effusion. Emphysema. Musculoskeletal: No acute fracture or destructive osseous lesion. CT ABDOMEN PELVIS FINDINGS Hepatobiliary: Cholecystectomy. No focal hepatic lesion. No biliary dilation. Pancreas: Unremarkable. Spleen: Unremarkable. Adrenals/Urinary Tract: Normal adrenal glands. No urinary calculi or hydronephrosis. Unremarkable bladder. Stomach/Bowel: Normal caliber large and small bowel. Stomach is within normal limits. No bowel wall thickening. Vascular/Lymphatic: Aortic atherosclerosis. No enlarged abdominal or pelvic lymph nodes. Reproductive: No acute abnormality. Other: No free intraperitoneal fluid or air. Musculoskeletal: No acute fracture or destructive osseous lesion. IMPRESSION: 1. Increased masslike consolidation in the right upper lobe concerning for progression of disease/occurrence. 2. Worsening pulmonary and nodal metastases. 3. Chronic gas and fluid collection in the right apex with air-fluid level and bronchopleural fistula. 4. No evidence of metastatic disease in the abdomen or pelvis. Aortic Atherosclerosis (ICD10-I70.0) and Emphysema (ICD10-J43.9). Electronically Signed   By: Rozell Cornet M.D.   On: 02/17/2024 21:49   CT Head Wo Contrast Result Date: 02/17/2024 CLINICAL DATA:  Trauma, fall. EXAM: CT HEAD WITHOUT CONTRAST CT CERVICAL SPINE WITHOUT CONTRAST TECHNIQUE: Multidetector CT imaging of the head and cervical spine was  performed following the standard protocol without intravenous contrast. Multiplanar CT image reconstructions of the cervical spine were also generated. RADIATION DOSE REDUCTION: This exam was performed according to the departmental dose-optimization program which includes automated exposure control, adjustment of the mA and/or kV according to patient size and/or use of iterative reconstruction technique. COMPARISON:  CT head 03/28/2021. FINDINGS: CT HEAD FINDINGS Brain: No acute intracranial hemorrhage. There is vasogenic edema within the left occipital lobe extending into the posterior left temporal lobe. Suggestion of a heterogeneous 1.2 cm masslike lesion within this region. Additional edema within the left cerebellar hemisphere with an associated 1.4 cm lesion. Edema extends into the cerebellar vermis with associated mass effect and partial effacement of the fourth ventricle. There is also partial effacement of the quadrigeminal cistern more pronounced on the left. Additional foci of vasogenic edema in the left parietal lobe and anterior right frontal lobe. Partial effacement of the left occipital and temporal horns. No hydrocephalus. No midline shift. Vascular: No hyperdense vessel or unexpected calcification. Skull: Normal. Negative for fracture or focal lesion. Sinuses/Orbits: No acute finding. Orbits are symmetric. Mucosal thickening and mucous retention cysts in the maxillary sinuses. Other: None. CT CERVICAL SPINE FINDINGS Alignment: Cervical lordosis is maintained. Trace stepwise retrolisthesis of C4 on C5, C5 on C6, and C6 on C7. No facet subluxation or dislocation. Skull base and vertebrae: No compression fracture or displaced fracture in the cervical spine. Soft tissues and spinal canal: No prevertebral fluid or swelling. No visible canal hematoma. Disc levels: Mild disc space narrowing at multiple levels. There are small disc bulges throughout the cervical spine. Mild spinal canal stenosis at C4-5.  Facet arthrosis at multiple levels most pronounced on the left at C3-4. No high-grade osseous foraminal narrowing. Upper chest: Complete opacification of the partially visualized right lung apex. Right-sided central venous catheter. Emphysema in the left lung apex. Other: None. IMPRESSION: Vasogenic edema in the left occipital lobe and left  cerebellum with associated mass lesions. Additional vasogenic edema in the left parietal lobe and anterior right frontal lobe. Findings concerning for metastatic disease. Recommend contrast enhanced MRI brain for further evaluation. Edema in the cerebellum results in partial effacement of the fourth ventricle and quadrigeminal cistern. No hydrocephalus. No acute intracranial hemorrhage. No acute fracture or traumatic malalignment of the cervical spine. Complete opacification of the right lung apex. Emphysema in the left lung apex. Electronically Signed   By: Denny Flack M.D.   On: 02/17/2024 15:42   CT Cervical Spine Wo Contrast Result Date: 02/17/2024 CLINICAL DATA:  Trauma, fall. EXAM: CT HEAD WITHOUT CONTRAST CT CERVICAL SPINE WITHOUT CONTRAST TECHNIQUE: Multidetector CT imaging of the head and cervical spine was performed following the standard protocol without intravenous contrast. Multiplanar CT image reconstructions of the cervical spine were also generated. RADIATION DOSE REDUCTION: This exam was performed according to the departmental dose-optimization program which includes automated exposure control, adjustment of the mA and/or kV according to patient size and/or use of iterative reconstruction technique. COMPARISON:  CT head 03/28/2021. FINDINGS: CT HEAD FINDINGS Brain: No acute intracranial hemorrhage. There is vasogenic edema within the left occipital lobe extending into the posterior left temporal lobe. Suggestion of a heterogeneous 1.2 cm masslike lesion within this region. Additional edema within the left cerebellar hemisphere with an associated 1.4 cm  lesion. Edema extends into the cerebellar vermis with associated mass effect and partial effacement of the fourth ventricle. There is also partial effacement of the quadrigeminal cistern more pronounced on the left. Additional foci of vasogenic edema in the left parietal lobe and anterior right frontal lobe. Partial effacement of the left occipital and temporal horns. No hydrocephalus. No midline shift. Vascular: No hyperdense vessel or unexpected calcification. Skull: Normal. Negative for fracture or focal lesion. Sinuses/Orbits: No acute finding. Orbits are symmetric. Mucosal thickening and mucous retention cysts in the maxillary sinuses. Other: None. CT CERVICAL SPINE FINDINGS Alignment: Cervical lordosis is maintained. Trace stepwise retrolisthesis of C4 on C5, C5 on C6, and C6 on C7. No facet subluxation or dislocation. Skull base and vertebrae: No compression fracture or displaced fracture in the cervical spine. Soft tissues and spinal canal: No prevertebral fluid or swelling. No visible canal hematoma. Disc levels: Mild disc space narrowing at multiple levels. There are small disc bulges throughout the cervical spine. Mild spinal canal stenosis at C4-5. Facet arthrosis at multiple levels most pronounced on the left at C3-4. No high-grade osseous foraminal narrowing. Upper chest: Complete opacification of the partially visualized right lung apex. Right-sided central venous catheter. Emphysema in the left lung apex. Other: None. IMPRESSION: Vasogenic edema in the left occipital lobe and left cerebellum with associated mass lesions. Additional vasogenic edema in the left parietal lobe and anterior right frontal lobe. Findings concerning for metastatic disease. Recommend contrast enhanced MRI brain for further evaluation. Edema in the cerebellum results in partial effacement of the fourth ventricle and quadrigeminal cistern. No hydrocephalus. No acute intracranial hemorrhage. No acute fracture or traumatic  malalignment of the cervical spine. Complete opacification of the right lung apex. Emphysema in the left lung apex. Electronically Signed   By: Denny Flack M.D.   On: 02/17/2024 15:42   DG Foot Complete Left Result Date: 02/17/2024 CLINICAL DATA:  Pain after fall. EXAM: LEFT FOOT - COMPLETE 3+ VIEW COMPARISON:  07/07/2023 FINDINGS: There is no evidence of fracture or dislocation. Again seen degenerative changes of the first metatarsal phalangeal joint and dorsal midfoot. Soft tissues are unremarkable. IMPRESSION: No  fracture or dislocation of the left foot. Electronically Signed   By: Chadwick Colonel M.D.   On: 02/17/2024 15:32   DG Ribs Unilateral W/Chest Left Result Date: 02/17/2024 CLINICAL DATA:  Pain after fall. EXAM: LEFT RIBS AND CHEST - 3+ VIEW COMPARISON:  Chest radiograph 10/09/2021 FINDINGS: The chest view is coned and excludes the right lateral aspect of the hemithorax. No acute left rib fracture. No evidence of rib lesion or bone destruction. Post treatment related changes at the right lung apex with chronic apical opacity. Right chest port remains in place. Left-sided pacemaker remains in place. The left lung is hyperinflated. No pneumothorax or pleural effusion. Stable heart size and mediastinal contours. IMPRESSION: 1. No acute left rib fracture or pulmonary complication related to fall. 2. Chronic and stable post treatment related changes at the right lung apex. Electronically Signed   By: Chadwick Colonel M.D.   On: 02/17/2024 15:31   DG Tibia/Fibula Left Result Date: 02/17/2024 CLINICAL DATA:  Pain after fall. EXAM: LEFT TIBIA AND FIBULA - 2 VIEW COMPARISON:  None Available. FINDINGS: There is no evidence of fracture or other focal bone lesions. Cortical margins of the tibia and fibula are intact. Knee and ankle alignment are maintained. Small surgical clips in the posteromedial soft tissues. IMPRESSION: Negative radiographs of the left tibia and fibula. Electronically Signed   By:  Chadwick Colonel M.D.   On: 02/17/2024 15:30      Subjective: Patient denies any complaints.  Seen this morning.  Awaiting XRT.  Discharge Exam:  Vitals:   02/26/24 1020 02/26/24 1338 02/26/24 1718 02/27/24 0525  BP: 91/63 97/62 99/66  93/71  Pulse: 80 78 88 71  Resp: 16 18 16 18   Temp: 98.9 F (37.2 C) 98 F (36.7 C)  98 F (36.7 C)  TempSrc: Oral Oral  Oral  SpO2: 98% 98% 98% 97%  Weight:      Height:       Exam stable without change for last couple of days. General exam: Middle-age Mark, moderately built and frail lying comfortably propped up in bed without distress.  Appears to be in great spirits. Respiratory system: Clear to auscultation.  No increased work of breathing. Cardiovascular system: S1 & S2 heard, RRR. No JVD, murmurs, rubs, gallops or clicks. No pedal edema. Gastrointestinal system: Abdomen is nondistended, soft and nontender. No organomegaly or masses felt. Normal bowel sounds heard. Central nervous system: Alert and oriented. No focal neurological deficits. Extremities: Symmetric 5 x 5 power.  Healed right BKA. Skin: No rashes, lesions or ulcers Psychiatry: Judgement and insight appear normal. Mood & affect appropriate.    The results of significant diagnostics from this hospitalization (including imaging, microbiology, ancillary and laboratory) are listed below for reference.     Microbiology: Recent Results (from the past 240 hours)  Blood culture (routine x 2)     Status: None   Collection Time: 02/17/24  4:18 PM   Specimen: BLOOD  Result Value Ref Range Status   Specimen Description BLOOD LEFT ANTECUBITAL  Final   Special Requests   Final    BOTTLES DRAWN AEROBIC ONLY Blood Culture results may not be optimal due to an inadequate volume of blood received in culture bottles   Culture   Final    NO GROWTH 5 DAYS Performed at [facility], [address], [place], [place] [address]    Report Status 02/22/2024 FINAL  Final  Blood culture  (routine x 2)     Status: None  Collection Time: 02/17/24  4:18 PM   Specimen: BLOOD  Result Value Ref Range Status   Specimen Description BLOOD LEFT ANTECUBITAL  Final   Special Requests   Final    BOTTLES DRAWN AEROBIC ONLY Blood Culture results may not be optimal due to an inadequate volume of blood received in culture bottles   Culture   Final    NO GROWTH 5 DAYS Performed at [facility], [address] [place], [place] [address]    Report Status 02/22/2024 FINAL  Final     Labs: CBC: No results for input(s): "WBC", "NEUTROABS", "HGB", "HCT", "MCV", "PLT" in the last 168 hours.   Basic Metabolic Panel: Recent Labs  Lab 02/22/24 1000  NA 130*  K 4.3  CL 94*  CO2 26  GLUCOSE 145*  BUN 18  CREATININE 0.88  CALCIUM  8.5*  MG 2.5*    Liver Function Tests: No results for input(s): "AST", "ALT", "ALKPHOS", "BILITOT", "PROT", "ALBUMIN " in the last 168 hours.   CBG: No results for input(s): "GLUCAP" in the last 168 hours.  Discussed in detail with patient's daughter via phone on 5/30.  She is aware that patient was supposed to discharge that day which was delayed due to awaiting hospital bed to be delivered to their home.  She stateed that she and her husband will not be able to accommodate patient at home until a bed is delivered.  Patient cannot return to his home due to broken toilet, bedbug infestation, no ramp and hence daughter is taking him home with her.    Discussed with TOC.  Time coordinating discharge: 50 minutes  SIGNED:  Aubrey Blas, MD,  FACP, [facility], [facility], [facility]   [facility] [place]     To contact the attending provider between 7A-7P or the covering provider during after hours 7P-7A, please log into the web site [email or web address] and access using universal [place] password for that web site. If you do not have the password, please call the hospital operator.

## 2024-02-27 NOTE — Progress Notes (Signed)
 TR[facility] Progress note  Patient was discharged on 5/30 but unable to leave because family was trying to reorganize their home so that the hospital bed could be delivered and the earliest that they would be able to accept the bed delivery would be on 6/2.  Therefore discharge was canceled.  For details of current hospitalization, refer to DC summary updated today by this MD.  Eleuterio Grimes and examined patient this morning.  Denies complaints.  Specifically no pain.  He was yet to receive today's XRT.  He feels that the hospital bed would have been delivered at home but he was not sure.  Vital signs and exam stable for the last several days.  No new labs to review.  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  Family will provide transport. Patient will be discharging to his daughters place after hospital bed has been delivered on 6/2. Rest of medical problems as per DC summary  Bedbug infestation A bedbug was found in patient's room a couple days ago by patient's granddaughter.  Therefore patient is on contact isolation.  Discussed with TOC today and they will contact family to confirm hospital bed delivery.  Aubrey Blas, MD,  FACP, [facility], [facility], [facility]   [facility] [place]     To contact the attending provider between 7A-7P or the covering provider during after hours 7P-7A, please log into the web site [email or web address] and access using universal [place] password for that web site. If you do not have the password, please call the hospital operator.

## 2024-02-28 ENCOUNTER — Ambulatory Visit
Admission: RE | Admit: 2024-02-28 | Discharge: 2024-02-28 | Disposition: A | Discharge status: Home / Self Care | Source: Ambulatory Visit | Attending: Radiation Oncology | Admitting: Radiation Oncology

## 2024-02-28 ENCOUNTER — Other Ambulatory Visit: Payer: Self-pay

## 2024-02-28 ENCOUNTER — Telehealth: Payer: Self-pay | Admitting: Internal Medicine

## 2024-02-28 ENCOUNTER — Telehealth: Payer: Self-pay

## 2024-02-28 DIAGNOSIS — Z510 Encounter for antineoplastic radiation therapy: Secondary | ICD-10-CM | POA: Diagnosis not present

## 2024-02-28 DIAGNOSIS — C7931 Secondary malignant neoplasm of brain: Secondary | ICD-10-CM | POA: Diagnosis not present

## 2024-02-28 DIAGNOSIS — C3491 Malignant neoplasm of unspecified part of right bronchus or lung: Secondary | ICD-10-CM | POA: Diagnosis not present

## 2024-02-28 LAB — RAD ONC ARIA SESSION SUMMARY
Course Elapsed Days: 5
Plan Fractions Treated to Date: 4
Plan Prescribed Dose Per Fraction: 3 Gy
Plan Total Fractions Prescribed: 10
Plan Total Prescribed Dose: 30 Gy
Reference Point Dosage Given to Date: 12 Gy
Reference Point Session Dosage Given: 3 Gy
Session Number: 4

## 2024-02-28 NOTE — Telephone Encounter (Signed)
 Was unable to leave the patient a voicemail with the appointments scheduled. Will be mailed an appointment reminder.

## 2024-02-28 NOTE — Telephone Encounter (Signed)
 error

## 2024-02-28 NOTE — Transitions of Care (Post Inpatient/ED Visit) (Signed)
   02/28/2024  Name: [name] MRN: [id number] DOB: 11/15/1960  Today's TOC FU Call Status: Today's TOC FU Call Status:: Unsuccessful Call (1st Attempt) Unsuccessful Call (1st Attempt) Date: 02/28/24  Attempted to reach the patient regarding the most recent Inpatient/ED visit.  Follow Up Plan: Additional outreach attempts will be made to reach the patient to complete the Transitions of Care (Post Inpatient/ED visit) call.   Gareld June, BSN, RN [place]  VBCI - [employer] [facility] RN Care Manager [phone]

## 2024-02-29 ENCOUNTER — Telehealth: Payer: Self-pay

## 2024-02-29 ENCOUNTER — Other Ambulatory Visit: Payer: Self-pay

## 2024-02-29 ENCOUNTER — Ambulatory Visit: Payer: Medicare Other

## 2024-02-29 ENCOUNTER — Ambulatory Visit
Admission: RE | Admit: 2024-02-29 | Discharge: 2024-02-29 | Disposition: A | Discharge status: Home / Self Care | Source: Ambulatory Visit | Attending: Radiation Oncology | Admitting: Radiation Oncology

## 2024-02-29 DIAGNOSIS — C3411 Malignant neoplasm of upper lobe, right bronchus or lung: Secondary | ICD-10-CM | POA: Diagnosis not present

## 2024-02-29 DIAGNOSIS — C3491 Malignant neoplasm of unspecified part of right bronchus or lung: Secondary | ICD-10-CM | POA: Diagnosis not present

## 2024-02-29 DIAGNOSIS — C7931 Secondary malignant neoplasm of brain: Secondary | ICD-10-CM | POA: Diagnosis not present

## 2024-02-29 DIAGNOSIS — F17210 Nicotine dependence, cigarettes, uncomplicated: Secondary | ICD-10-CM | POA: Diagnosis not present

## 2024-02-29 DIAGNOSIS — Z510 Encounter for antineoplastic radiation therapy: Secondary | ICD-10-CM | POA: Diagnosis not present

## 2024-02-29 LAB — RAD ONC ARIA SESSION SUMMARY
Course Elapsed Days: 6
Plan Fractions Treated to Date: 5
Plan Prescribed Dose Per Fraction: 3 Gy
Plan Total Fractions Prescribed: 10
Plan Total Prescribed Dose: 30 Gy
Reference Point Dosage Given to Date: 15 Gy
Reference Point Session Dosage Given: 3 Gy
Session Number: 5

## 2024-02-29 NOTE — Transitions of Care (Post Inpatient/ED Visit) (Signed)
   02/29/2024  Name: [name] MRN: [id number] DOB: Mar 04, 1961  Today's TOC FU Call Status: Today's TOC FU Call Status:: Unsuccessful Call (2nd Attempt) Unsuccessful Call (1st Attempt) Date: 02/28/24 Unsuccessful Call (2nd Attempt) Date: 02/29/24  Attempted to reach the patient regarding the most recent Inpatient/ED visit.  Follow Up Plan: Additional outreach attempts will be made to reach the patient to complete the Transitions of Care (Post Inpatient/ED visit) call.   Gareld June, BSN, RN [place]  VBCI - [employer] [facility] RN Care Manager [phone]

## 2024-03-01 ENCOUNTER — Other Ambulatory Visit: Payer: Self-pay

## 2024-03-01 ENCOUNTER — Telehealth: Payer: Self-pay

## 2024-03-01 ENCOUNTER — Ambulatory Visit
Admission: RE | Admit: 2024-03-01 | Discharge: 2024-03-01 | Discharge status: Home / Self Care | Source: Ambulatory Visit | Attending: Radiation Oncology

## 2024-03-01 DIAGNOSIS — C7931 Secondary malignant neoplasm of brain: Secondary | ICD-10-CM | POA: Diagnosis not present

## 2024-03-01 DIAGNOSIS — Z510 Encounter for antineoplastic radiation therapy: Secondary | ICD-10-CM | POA: Diagnosis not present

## 2024-03-01 DIAGNOSIS — C3491 Malignant neoplasm of unspecified part of right bronchus or lung: Secondary | ICD-10-CM | POA: Diagnosis not present

## 2024-03-01 LAB — RAD ONC ARIA SESSION SUMMARY
Course Elapsed Days: 7
Plan Fractions Treated to Date: 6
Plan Prescribed Dose Per Fraction: 3 Gy
Plan Total Fractions Prescribed: 10
Plan Total Prescribed Dose: 30 Gy
Reference Point Dosage Given to Date: 18 Gy
Reference Point Session Dosage Given: 3 Gy
Session Number: 6

## 2024-03-01 NOTE — Transitions of Care (Post Inpatient/ED Visit) (Signed)
   03/01/2024  Name: DEPAUL ARIZPE MRN: [id number] DOB: 06-23-1961  Today's TOC FU Call Status: Today's TOC FU Call Status:: Unsuccessful Call (3rd Attempt) Unsuccessful Call (1st Attempt) Date: 02/28/24 Unsuccessful Call (2nd Attempt) Date: 02/29/24 Unsuccessful Call (3rd Attempt) Date: 03/01/24  Attempted to reach the patient regarding the most recent Inpatient/ED visit.  Follow Up Plan: No further outreach attempts will be made at this time. We have been unable to contact the patient.  Gareld June, BSN, RN [place]  VBCI - [employer] [facility] RN Care Manager [phone]

## 2024-03-02 ENCOUNTER — Ambulatory Visit
Admission: RE | Admit: 2024-03-02 | Discharge: 2024-03-02 | Disposition: A | Discharge status: Home / Self Care | Source: Ambulatory Visit | Attending: Radiation Oncology | Admitting: Radiation Oncology

## 2024-03-02 ENCOUNTER — Ambulatory Visit

## 2024-03-02 ENCOUNTER — Other Ambulatory Visit: Payer: Self-pay

## 2024-03-02 DIAGNOSIS — I130 Hypertensive heart and chronic kidney disease with heart failure and stage 1 through stage 4 chronic kidney disease, or unspecified chronic kidney disease: Secondary | ICD-10-CM | POA: Diagnosis not present

## 2024-03-02 DIAGNOSIS — I2510 Atherosclerotic heart disease of native coronary artery without angina pectoris: Secondary | ICD-10-CM | POA: Diagnosis not present

## 2024-03-02 DIAGNOSIS — J439 Emphysema, unspecified: Secondary | ICD-10-CM | POA: Diagnosis not present

## 2024-03-02 DIAGNOSIS — C7931 Secondary malignant neoplasm of brain: Secondary | ICD-10-CM | POA: Diagnosis not present

## 2024-03-02 DIAGNOSIS — Z9181 History of falling: Secondary | ICD-10-CM | POA: Diagnosis not present

## 2024-03-02 DIAGNOSIS — Z7982 Long term (current) use of aspirin: Secondary | ICD-10-CM | POA: Diagnosis not present

## 2024-03-02 DIAGNOSIS — Z95810 Presence of automatic (implantable) cardiac defibrillator: Secondary | ICD-10-CM | POA: Diagnosis not present

## 2024-03-02 DIAGNOSIS — E7800 Pure hypercholesterolemia, unspecified: Secondary | ICD-10-CM | POA: Diagnosis not present

## 2024-03-02 DIAGNOSIS — N189 Chronic kidney disease, unspecified: Secondary | ICD-10-CM | POA: Diagnosis not present

## 2024-03-02 DIAGNOSIS — J4489 Other specified chronic obstructive pulmonary disease: Secondary | ICD-10-CM | POA: Diagnosis not present

## 2024-03-02 DIAGNOSIS — G5791 Unspecified mononeuropathy of right lower limb: Secondary | ICD-10-CM | POA: Diagnosis not present

## 2024-03-02 DIAGNOSIS — I5022 Chronic systolic (congestive) heart failure: Secondary | ICD-10-CM | POA: Diagnosis not present

## 2024-03-02 DIAGNOSIS — Z86718 Personal history of other venous thrombosis and embolism: Secondary | ICD-10-CM | POA: Diagnosis not present

## 2024-03-02 DIAGNOSIS — I252 Old myocardial infarction: Secondary | ICD-10-CM | POA: Diagnosis not present

## 2024-03-02 DIAGNOSIS — C3490 Malignant neoplasm of unspecified part of unspecified bronchus or lung: Secondary | ICD-10-CM | POA: Diagnosis not present

## 2024-03-02 DIAGNOSIS — E039 Hypothyroidism, unspecified: Secondary | ICD-10-CM | POA: Diagnosis not present

## 2024-03-02 DIAGNOSIS — Z89521 Acquired absence of right knee: Secondary | ICD-10-CM | POA: Diagnosis not present

## 2024-03-02 DIAGNOSIS — C3491 Malignant neoplasm of unspecified part of right bronchus or lung: Secondary | ICD-10-CM | POA: Diagnosis not present

## 2024-03-02 DIAGNOSIS — Z510 Encounter for antineoplastic radiation therapy: Secondary | ICD-10-CM | POA: Diagnosis not present

## 2024-03-02 DIAGNOSIS — L89312 Pressure ulcer of right buttock, stage 2: Secondary | ICD-10-CM | POA: Diagnosis not present

## 2024-03-02 DIAGNOSIS — F17210 Nicotine dependence, cigarettes, uncomplicated: Secondary | ICD-10-CM | POA: Diagnosis not present

## 2024-03-02 LAB — RAD ONC ARIA SESSION SUMMARY
Course Elapsed Days: 8
Plan Fractions Treated to Date: 7
Plan Prescribed Dose Per Fraction: 3 Gy
Plan Total Fractions Prescribed: 10
Plan Total Prescribed Dose: 30 Gy
Reference Point Dosage Given to Date: 21 Gy
Reference Point Session Dosage Given: 3 Gy
Session Number: 7

## 2024-03-05 ENCOUNTER — Encounter: Payer: Self-pay | Admitting: Radiation Oncology

## 2024-03-05 ENCOUNTER — Other Ambulatory Visit: Payer: Self-pay

## 2024-03-05 ENCOUNTER — Ambulatory Visit
Admission: RE | Admit: 2024-03-05 | Discharge: 2024-03-05 | Disposition: A | Discharge status: Home / Self Care | Source: Ambulatory Visit | Attending: Radiation Oncology | Admitting: Radiation Oncology

## 2024-03-05 DIAGNOSIS — C7931 Secondary malignant neoplasm of brain: Secondary | ICD-10-CM | POA: Diagnosis not present

## 2024-03-05 DIAGNOSIS — Z510 Encounter for antineoplastic radiation therapy: Secondary | ICD-10-CM | POA: Diagnosis not present

## 2024-03-05 DIAGNOSIS — C3491 Malignant neoplasm of unspecified part of right bronchus or lung: Secondary | ICD-10-CM | POA: Diagnosis not present

## 2024-03-05 LAB — RAD ONC ARIA SESSION SUMMARY
Course Elapsed Days: 11
Plan Fractions Treated to Date: 8
Plan Prescribed Dose Per Fraction: 3 Gy
Plan Total Fractions Prescribed: 10
Plan Total Prescribed Dose: 30 Gy
Reference Point Dosage Given to Date: 24 Gy
Reference Point Session Dosage Given: 3 Gy
Session Number: 8

## 2024-03-05 NOTE — Progress Notes (Signed)
  Radiation Oncology         [phone] ________________________________  Name: [name]  JYN:[id number]  Date of Service: 03/05/24  DOB: July 13, 1961   Steroid Taper Instructions   You currently have a prescription for Dexamethasone  4 mg Tablets.   Beginning 03/09/24  Take a 4 mg tablet twice a day  Beginning 03/16/24: Take 1/2 of a tablet (which is 2 mg) twice a day  Beginning 03/23/24: Take 1/2 of a tablet (which is 2 mg) once a day  Beginning 03/30/24: Take 1/2 of a tablet (which is 2 mg) every other day and stop on 04/04/24.   Please call our office if you have any headaches, visual changes, uncontrolled movements, extremity weakness, nausea or vomiting.

## 2024-03-06 ENCOUNTER — Ambulatory Visit
Admission: RE | Admit: 2024-03-06 | Discharge: 2024-03-06 | Disposition: A | Discharge status: Home / Self Care | Source: Ambulatory Visit | Attending: Radiation Oncology | Admitting: Radiation Oncology

## 2024-03-06 ENCOUNTER — Other Ambulatory Visit: Payer: Self-pay

## 2024-03-06 DIAGNOSIS — C3491 Malignant neoplasm of unspecified part of right bronchus or lung: Secondary | ICD-10-CM | POA: Diagnosis not present

## 2024-03-06 DIAGNOSIS — I252 Old myocardial infarction: Secondary | ICD-10-CM | POA: Diagnosis not present

## 2024-03-06 DIAGNOSIS — Z510 Encounter for antineoplastic radiation therapy: Secondary | ICD-10-CM | POA: Diagnosis not present

## 2024-03-06 DIAGNOSIS — J439 Emphysema, unspecified: Secondary | ICD-10-CM | POA: Diagnosis not present

## 2024-03-06 DIAGNOSIS — J4489 Other specified chronic obstructive pulmonary disease: Secondary | ICD-10-CM | POA: Diagnosis not present

## 2024-03-06 DIAGNOSIS — Z86718 Personal history of other venous thrombosis and embolism: Secondary | ICD-10-CM | POA: Diagnosis not present

## 2024-03-06 DIAGNOSIS — N189 Chronic kidney disease, unspecified: Secondary | ICD-10-CM | POA: Diagnosis not present

## 2024-03-06 DIAGNOSIS — G5791 Unspecified mononeuropathy of right lower limb: Secondary | ICD-10-CM | POA: Diagnosis not present

## 2024-03-06 DIAGNOSIS — L89312 Pressure ulcer of right buttock, stage 2: Secondary | ICD-10-CM | POA: Diagnosis not present

## 2024-03-06 DIAGNOSIS — Z7982 Long term (current) use of aspirin: Secondary | ICD-10-CM | POA: Diagnosis not present

## 2024-03-06 DIAGNOSIS — I5022 Chronic systolic (congestive) heart failure: Secondary | ICD-10-CM | POA: Diagnosis not present

## 2024-03-06 DIAGNOSIS — C7931 Secondary malignant neoplasm of brain: Secondary | ICD-10-CM | POA: Diagnosis not present

## 2024-03-06 DIAGNOSIS — C3490 Malignant neoplasm of unspecified part of unspecified bronchus or lung: Secondary | ICD-10-CM | POA: Diagnosis not present

## 2024-03-06 DIAGNOSIS — Z9181 History of falling: Secondary | ICD-10-CM | POA: Diagnosis not present

## 2024-03-06 DIAGNOSIS — F17210 Nicotine dependence, cigarettes, uncomplicated: Secondary | ICD-10-CM | POA: Diagnosis not present

## 2024-03-06 DIAGNOSIS — E039 Hypothyroidism, unspecified: Secondary | ICD-10-CM | POA: Diagnosis not present

## 2024-03-06 DIAGNOSIS — I2510 Atherosclerotic heart disease of native coronary artery without angina pectoris: Secondary | ICD-10-CM | POA: Diagnosis not present

## 2024-03-06 DIAGNOSIS — E7800 Pure hypercholesterolemia, unspecified: Secondary | ICD-10-CM | POA: Diagnosis not present

## 2024-03-06 DIAGNOSIS — Z95810 Presence of automatic (implantable) cardiac defibrillator: Secondary | ICD-10-CM | POA: Diagnosis not present

## 2024-03-06 DIAGNOSIS — Z89521 Acquired absence of right knee: Secondary | ICD-10-CM | POA: Diagnosis not present

## 2024-03-06 DIAGNOSIS — I130 Hypertensive heart and chronic kidney disease with heart failure and stage 1 through stage 4 chronic kidney disease, or unspecified chronic kidney disease: Secondary | ICD-10-CM | POA: Diagnosis not present

## 2024-03-06 LAB — RAD ONC ARIA SESSION SUMMARY
Course Elapsed Days: 12
Plan Fractions Treated to Date: 9
Plan Prescribed Dose Per Fraction: 3 Gy
Plan Total Fractions Prescribed: 10
Plan Total Prescribed Dose: 30 Gy
Reference Point Dosage Given to Date: 27 Gy
Reference Point Session Dosage Given: 3 Gy
Session Number: 9

## 2024-03-07 ENCOUNTER — Other Ambulatory Visit: Payer: Self-pay

## 2024-03-07 ENCOUNTER — Ambulatory Visit
Admission: RE | Admit: 2024-03-07 | Discharge: 2024-03-07 | Discharge status: Home / Self Care | Source: Ambulatory Visit | Attending: Radiation Oncology

## 2024-03-07 DIAGNOSIS — C3411 Malignant neoplasm of upper lobe, right bronchus or lung: Secondary | ICD-10-CM | POA: Diagnosis not present

## 2024-03-07 DIAGNOSIS — C3491 Malignant neoplasm of unspecified part of right bronchus or lung: Secondary | ICD-10-CM | POA: Diagnosis not present

## 2024-03-07 DIAGNOSIS — F17210 Nicotine dependence, cigarettes, uncomplicated: Secondary | ICD-10-CM | POA: Diagnosis not present

## 2024-03-07 DIAGNOSIS — C7931 Secondary malignant neoplasm of brain: Secondary | ICD-10-CM | POA: Diagnosis not present

## 2024-03-07 DIAGNOSIS — Z510 Encounter for antineoplastic radiation therapy: Secondary | ICD-10-CM | POA: Diagnosis not present

## 2024-03-07 LAB — RAD ONC ARIA SESSION SUMMARY
Course Elapsed Days: 13
Plan Fractions Treated to Date: 10
Plan Prescribed Dose Per Fraction: 3 Gy
Plan Total Fractions Prescribed: 10
Plan Total Prescribed Dose: 30 Gy
Reference Point Dosage Given to Date: 30 Gy
Reference Point Session Dosage Given: 3 Gy
Session Number: 10

## 2024-03-08 DIAGNOSIS — Z89521 Acquired absence of right knee: Secondary | ICD-10-CM | POA: Diagnosis not present

## 2024-03-08 DIAGNOSIS — C3491 Malignant neoplasm of unspecified part of right bronchus or lung: Secondary | ICD-10-CM | POA: Diagnosis not present

## 2024-03-08 DIAGNOSIS — Z86718 Personal history of other venous thrombosis and embolism: Secondary | ICD-10-CM | POA: Diagnosis not present

## 2024-03-08 DIAGNOSIS — E7800 Pure hypercholesterolemia, unspecified: Secondary | ICD-10-CM | POA: Diagnosis not present

## 2024-03-08 DIAGNOSIS — I252 Old myocardial infarction: Secondary | ICD-10-CM | POA: Diagnosis not present

## 2024-03-08 DIAGNOSIS — E039 Hypothyroidism, unspecified: Secondary | ICD-10-CM | POA: Diagnosis not present

## 2024-03-08 DIAGNOSIS — I2510 Atherosclerotic heart disease of native coronary artery without angina pectoris: Secondary | ICD-10-CM | POA: Diagnosis not present

## 2024-03-08 DIAGNOSIS — Z7982 Long term (current) use of aspirin: Secondary | ICD-10-CM | POA: Diagnosis not present

## 2024-03-08 DIAGNOSIS — G5791 Unspecified mononeuropathy of right lower limb: Secondary | ICD-10-CM | POA: Diagnosis not present

## 2024-03-08 DIAGNOSIS — J4489 Other specified chronic obstructive pulmonary disease: Secondary | ICD-10-CM | POA: Diagnosis not present

## 2024-03-08 DIAGNOSIS — Z95810 Presence of automatic (implantable) cardiac defibrillator: Secondary | ICD-10-CM | POA: Diagnosis not present

## 2024-03-08 DIAGNOSIS — I130 Hypertensive heart and chronic kidney disease with heart failure and stage 1 through stage 4 chronic kidney disease, or unspecified chronic kidney disease: Secondary | ICD-10-CM | POA: Diagnosis not present

## 2024-03-08 DIAGNOSIS — C3490 Malignant neoplasm of unspecified part of unspecified bronchus or lung: Secondary | ICD-10-CM | POA: Diagnosis not present

## 2024-03-08 DIAGNOSIS — F17210 Nicotine dependence, cigarettes, uncomplicated: Secondary | ICD-10-CM | POA: Diagnosis not present

## 2024-03-08 DIAGNOSIS — Z9181 History of falling: Secondary | ICD-10-CM | POA: Diagnosis not present

## 2024-03-08 DIAGNOSIS — C7931 Secondary malignant neoplasm of brain: Secondary | ICD-10-CM | POA: Diagnosis not present

## 2024-03-08 DIAGNOSIS — N189 Chronic kidney disease, unspecified: Secondary | ICD-10-CM | POA: Diagnosis not present

## 2024-03-08 DIAGNOSIS — L89312 Pressure ulcer of right buttock, stage 2: Secondary | ICD-10-CM | POA: Diagnosis not present

## 2024-03-08 DIAGNOSIS — I5022 Chronic systolic (congestive) heart failure: Secondary | ICD-10-CM | POA: Diagnosis not present

## 2024-03-08 DIAGNOSIS — J439 Emphysema, unspecified: Secondary | ICD-10-CM | POA: Diagnosis not present

## 2024-03-08 NOTE — Radiation Completion Notes (Addendum)
  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 The patient tolerated radiation. He developed fatigue and anticipated skin changes in the treatment field.   The patient will receive a call in about one month from the radiation oncology department. He will continue follow up with Dr. Marguerita Shih as well as in 3 months with repeat MRI brain.      Shelvia Dick, PAC

## 2024-03-14 ENCOUNTER — Other Ambulatory Visit: Payer: Self-pay | Admitting: Radiation Oncology

## 2024-03-14 DIAGNOSIS — C3490 Malignant neoplasm of unspecified part of unspecified bronchus or lung: Secondary | ICD-10-CM

## 2024-03-15 ENCOUNTER — Ambulatory Visit: Payer: Medicare Other

## 2024-03-15 DIAGNOSIS — N189 Chronic kidney disease, unspecified: Secondary | ICD-10-CM | POA: Diagnosis not present

## 2024-03-15 DIAGNOSIS — F17210 Nicotine dependence, cigarettes, uncomplicated: Secondary | ICD-10-CM | POA: Diagnosis not present

## 2024-03-15 DIAGNOSIS — I130 Hypertensive heart and chronic kidney disease with heart failure and stage 1 through stage 4 chronic kidney disease, or unspecified chronic kidney disease: Secondary | ICD-10-CM | POA: Diagnosis not present

## 2024-03-15 DIAGNOSIS — E039 Hypothyroidism, unspecified: Secondary | ICD-10-CM | POA: Diagnosis not present

## 2024-03-15 DIAGNOSIS — J439 Emphysema, unspecified: Secondary | ICD-10-CM | POA: Diagnosis not present

## 2024-03-15 DIAGNOSIS — I2510 Atherosclerotic heart disease of native coronary artery without angina pectoris: Secondary | ICD-10-CM | POA: Diagnosis not present

## 2024-03-15 DIAGNOSIS — I252 Old myocardial infarction: Secondary | ICD-10-CM | POA: Diagnosis not present

## 2024-03-15 DIAGNOSIS — Z86718 Personal history of other venous thrombosis and embolism: Secondary | ICD-10-CM | POA: Diagnosis not present

## 2024-03-15 DIAGNOSIS — Z89521 Acquired absence of right knee: Secondary | ICD-10-CM | POA: Diagnosis not present

## 2024-03-15 DIAGNOSIS — L89312 Pressure ulcer of right buttock, stage 2: Secondary | ICD-10-CM | POA: Diagnosis not present

## 2024-03-15 DIAGNOSIS — J4489 Other specified chronic obstructive pulmonary disease: Secondary | ICD-10-CM | POA: Diagnosis not present

## 2024-03-15 DIAGNOSIS — Z7982 Long term (current) use of aspirin: Secondary | ICD-10-CM | POA: Diagnosis not present

## 2024-03-15 DIAGNOSIS — E7800 Pure hypercholesterolemia, unspecified: Secondary | ICD-10-CM | POA: Diagnosis not present

## 2024-03-15 DIAGNOSIS — G5791 Unspecified mononeuropathy of right lower limb: Secondary | ICD-10-CM | POA: Diagnosis not present

## 2024-03-15 DIAGNOSIS — C7931 Secondary malignant neoplasm of brain: Secondary | ICD-10-CM | POA: Diagnosis not present

## 2024-03-15 DIAGNOSIS — C3491 Malignant neoplasm of unspecified part of right bronchus or lung: Secondary | ICD-10-CM | POA: Diagnosis not present

## 2024-03-15 DIAGNOSIS — C3490 Malignant neoplasm of unspecified part of unspecified bronchus or lung: Secondary | ICD-10-CM | POA: Diagnosis not present

## 2024-03-15 DIAGNOSIS — Z9181 History of falling: Secondary | ICD-10-CM | POA: Diagnosis not present

## 2024-03-15 DIAGNOSIS — I5022 Chronic systolic (congestive) heart failure: Secondary | ICD-10-CM | POA: Diagnosis not present

## 2024-03-15 DIAGNOSIS — Z95810 Presence of automatic (implantable) cardiac defibrillator: Secondary | ICD-10-CM | POA: Diagnosis not present

## 2024-03-16 ENCOUNTER — Other Ambulatory Visit: Payer: Self-pay

## 2024-03-16 DIAGNOSIS — C3491 Malignant neoplasm of unspecified part of right bronchus or lung: Secondary | ICD-10-CM

## 2024-03-19 ENCOUNTER — Encounter: Payer: Self-pay | Admitting: Internal Medicine

## 2024-03-19 ENCOUNTER — Inpatient Hospital Stay (HOSPITAL_BASED_OUTPATIENT_CLINIC_OR_DEPARTMENT_OTHER): Admitting: Internal Medicine

## 2024-03-19 ENCOUNTER — Inpatient Hospital Stay: Attending: Internal Medicine

## 2024-03-19 ENCOUNTER — Inpatient Hospital Stay

## 2024-03-19 VITALS — BP 88/61 | HR 95 | Temp 98.2°F | Resp 17 | Ht 72.0 in | Wt 137.1 lb

## 2024-03-19 DIAGNOSIS — R64 Cachexia: Secondary | ICD-10-CM | POA: Diagnosis not present

## 2024-03-19 DIAGNOSIS — Z79899 Other long term (current) drug therapy: Secondary | ICD-10-CM | POA: Diagnosis not present

## 2024-03-19 DIAGNOSIS — I110 Hypertensive heart disease with heart failure: Secondary | ICD-10-CM | POA: Insufficient documentation

## 2024-03-19 DIAGNOSIS — C7931 Secondary malignant neoplasm of brain: Secondary | ICD-10-CM | POA: Diagnosis not present

## 2024-03-19 DIAGNOSIS — Z7982 Long term (current) use of aspirin: Secondary | ICD-10-CM | POA: Insufficient documentation

## 2024-03-19 DIAGNOSIS — C3491 Malignant neoplasm of unspecified part of right bronchus or lung: Secondary | ICD-10-CM

## 2024-03-19 DIAGNOSIS — C3411 Malignant neoplasm of upper lobe, right bronchus or lung: Secondary | ICD-10-CM | POA: Diagnosis not present

## 2024-03-19 DIAGNOSIS — E039 Hypothyroidism, unspecified: Secondary | ICD-10-CM | POA: Diagnosis not present

## 2024-03-19 DIAGNOSIS — I5021 Acute systolic (congestive) heart failure: Secondary | ICD-10-CM | POA: Insufficient documentation

## 2024-03-19 LAB — CMP (CANCER CENTER ONLY)
ALT: 34 U/L (ref 0–44)
AST: 23 U/L (ref 15–41)
Albumin: 3.8 g/dL (ref 3.5–5.0)
Alkaline Phosphatase: 97 U/L (ref 38–126)
Anion gap: 7 (ref 5–15)
BUN: 15 mg/dL (ref 8–23)
CO2: 28 mmol/L (ref 22–32)
Calcium: 8.5 mg/dL — ABNORMAL LOW (ref 8.9–10.3)
Chloride: 100 mmol/L (ref 98–111)
Creatinine: 0.7 mg/dL (ref 0.61–1.24)
GFR, Estimated: >60 mL/min (ref >60)
Glucose, Bld: 130 mg/dL — ABNORMAL HIGH (ref 70–99)
Potassium: 3.8 mmol/L (ref 3.5–5.1)
Sodium: 135 mmol/L (ref 135–145)
Total Bilirubin: 0.7 mg/dL (ref 0.0–1.2)
Total Protein: 6.4 g/dL — ABNORMAL LOW (ref 6.5–8.1)

## 2024-03-19 LAB — CBC WITH DIFFERENTIAL (CANCER CENTER ONLY)
Abs Immature Granulocytes: 0.36 10*3/uL — ABNORMAL HIGH (ref 0.00–0.07)
Basophils Absolute: 0 10*3/uL (ref 0.0–0.1)
Basophils Relative: 0 %
Eosinophils Absolute: 0 10*3/uL (ref 0.0–0.5)
Eosinophils Relative: 0 %
HCT: 43.1 % (ref 39.0–52.0)
Hemoglobin: 14.1 g/dL (ref 13.0–17.0)
Immature Granulocytes: 2 %
Lymphocytes Relative: 4 %
Lymphs Abs: 0.6 10*3/uL — ABNORMAL LOW (ref 0.7–4.0)
MCH: 27.8 pg (ref 26.0–34.0)
MCHC: 32.7 g/dL (ref 30.0–36.0)
MCV: 84.8 fL (ref 80.0–100.0)
Monocytes Absolute: 0.6 10*3/uL (ref 0.1–1.0)
Monocytes Relative: 4 %
Neutro Abs: 13.3 10*3/uL — ABNORMAL HIGH (ref 1.7–7.7)
Neutrophils Relative %: 90 %
Platelet Count: 212 10*3/uL (ref 150–400)
RBC: 5.08 MIL/uL (ref 4.22–5.81)
RDW: 17.4 % — ABNORMAL HIGH (ref 11.5–15.5)
WBC Count: 15 10*3/uL — ABNORMAL HIGH (ref 4.0–10.5)
nRBC: 0 % (ref 0.0–0.2)

## 2024-03-19 MED ORDER — LIDOCAINE-PRILOCAINE 2.5-2.5 % EX CREA: TOPICAL_CREAM | CUTANEOUS | 3 refills | Status: DC

## 2024-03-19 MED ORDER — DEXAMETHASONE 4 MG PO TABS
ORAL_TABLET | ORAL | 1 refills | Status: DC
Start: 2024-03-19 — End: 2024-05-12

## 2024-03-19 MED ORDER — ONDANSETRON HCL 8 MG PO TABS
8.0000 mg | ORAL_TABLET | Freq: Three times a day (TID) | ORAL | 1 refills | Status: DC | PRN
Start: 2024-03-19 — End: 2024-05-12

## 2024-03-19 MED ORDER — CYANOCOBALAMIN 1000 MCG/ML IJ SOLN
1000.0000 ug | Freq: Once | INTRAMUSCULAR | Status: AC
  Administered 2024-03-19: 1000 ug via INTRAMUSCULAR
  Filled 2024-03-19: qty 1

## 2024-03-19 MED ORDER — FOLIC ACID 1 MG PO TABS: 1.0000 mg | ORAL_TABLET | Freq: Every day | ORAL | 3 refills | Status: DC

## 2024-03-19 NOTE — Progress Notes (Incomplete)
 Teams message to [facility]

## 2024-03-19 NOTE — Addendum Note (Signed)
 Addended by: CAROLEE LOA DEL on: 03/19/2024 12:42 PM   Modules accepted: Orders

## 2024-03-19 NOTE — Progress Notes (Signed)
 Team  message sent to Sherlean Ashing to enroll pt in transportation. Referred to Devere Manna , MSW.

## 2024-03-19 NOTE — Progress Notes (Signed)
 [facility] [phone]   [phone]  OFFICE PROGRESS NOTE  Bertell Satterfield, MD [address] [place] [address]  DIAGNOSIS: Recurrent non-small cell lung cancer initially diagnosed as stage IIIA (T3, N2, M0) non-small cell lung cancer, adenosquamous carcinoma diagnosed in June 2019 and presented with large right upper lobe lung mass with questionable chest wall invasion as well as right hilar and mediastinal lymphadenopathy. He had disease progression in June 2021.  Biomarker Findings Tumor Mutational Burden - TMB-High (21 Muts/Mb) Microsatellite status - MS-Stable Genomic Findings For a complete list of the genes assayed, please refer to the Appendix. ATM E2183* CCND2 P281R KRAS G12V CHEK2 T367fs*15 TP53 E298* 7 Disease relevant genes with no reportable alterations: ALK, EGFR, BRAF, MET, RET, ERBB2, ROS1  PDL1 Expression: 99%.   PRIOR THERAPY:  1) Concurrent chemoradiation with chemotherapy consisting of weekly carboplatin  for an AUC of 2 and paclitaxel  45 mg/m2.  First dose given on 04/03/2018.  Status post 6 cycles.  Last dose was giving 05/15/2018 with partial response. 2) Consolidation treatment with immunotherapy with Imfinzi  (Durvalumab ) 10 mg/KG every 2 weeks.  First dose 06/20/2018.  Status post 26 cycles. 3) Systemic treatment with immunotherapy with Keytruda  200 mg IV every 3 weeks.  First dose February 27, 2020. Status post 12 cycles. Discontinued due to disease progression.   CURRENT THERAPY:  Carboplatin  for an AUC of 5 and Alimta  500 mg per metered squared on days 1 IV every 3 weeks.  First dose expected on 03/03/2021. Status post 10 cycle. His dose of carboplatin  was reduced to an AUC of 4 and Alimta  reduced to 400 mg/m2 due to intolerance. Starting from cycle #6, we will start him on maintenance Alimta  400 mg/m2.  His treatment is currently on hold secondary to toxicity.  INTERVAL HISTORY: [name] 63 y.o. male returns to  the clinic today for follow-up visit accompanied by a friend and his granddaughter.  His daughter Rosina was also available by phone during the visit.Discussed the use of AI scribe software for clinical note transcription with the patient, who gave verbal consent to proceed.  History of Present Illness   [name] is a 63 year old male with metastatic squamous cell carcinoma who presents for re-evaluation and management of his condition. He is accompanied by his daughter, Rosina, who is his primary caregiver.  He has a history of metastatic squamous cell carcinoma, initially diagnosed in 2019. He has undergone various treatments including immunotherapy and chemotherapy with Alimta , which was discontinued due to toxicity. He was lost to follow-up for over a year.  He was admitted to [facility] on Feb 17, 2024, due to falls and gait imbalance. Imaging studies revealed widespread metastatic disease to the brain and increasing mass consolidation in the right lung.  He completed whole brain radiation therapy, although the exact timeline of completion is unclear. He feels 'pretty good' despite the progression of his disease.  He has not received any chemotherapy for over a year since his treatment was put on hold.  He has experienced ongoing weight loss. His daughter, Rosina, is actively involved in his care and is coordinating with healthcare providers to manage his treatment and transportation needs.       MEDICAL HISTORY: Past Medical History:  Diagnosis Date   Acute hepatitis C virus infection 06/19/2007   Qualifier: Diagnosis of  By: Leron CMA, Tiffany     Acute ST elevation myocardial infarction (STEMI) involving left  anterior descending (LAD) coronary artery (HCC) 05/26/2016   Acute ST elevation myocardial infarction (STEMI) of lateral wall (HCC) 05/26/2016   AICD (automatic cardioverter/defibrillator) present 03/02/2018   Anxiety    Asthma    Atherosclerosis of native arteries of  the extremities with intermittent claudication 12/16/2011   Chronic hepatitis C without hepatic coma (HCC) 05/03/2014   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/2018   FEV1 57%   Depression    DVT (deep venous thrombosis) (HCC) 2009; 2019   RLE; LLE   Encounter for antineoplastic chemotherapy 03/22/2018   Hepatitis C    tx'd in 2015   High cholesterol    Ischemic cardiomyopathy 03/02/2018   Non-small cell carcinoma of right lung, stage 3 (HCC) 03/22/2018   PAD (peripheral artery disease) (HCC) 01/25/2013   Peripheral vascular disease, unspecified 07/20/2012   Port-A-Cath in place 04/24/2018   ST elevation myocardial infarction involving left anterior descending (LAD) coronary artery (HCC)    STEMI (ST elevation myocardial infarction) (HCC) 05/26/2016   Tobacco abuse 01/28/2016   Tubular adenoma of colon 07/11/2014   Urinary dribbling     ALLERGIES:  is allergic to lyrica [pregabalin] and neurontin [gabapentin].  MEDICATIONS:  Current Outpatient Medications  Medication Sig Dispense Refill   albuterol  (VENTOLIN  HFA) 108 (90 Base) MCG/ACT inhaler Inhale 1-2 puffs into the lungs every 4 (four) hours as needed for wheezing or shortness of breath.     alprazolam  (XANAX ) 2 MG tablet Take 2 mg by mouth 4 (four) times daily as needed for anxiety.     amitriptyline  (ELAVIL ) 100 MG tablet Take 100 mg by mouth at bedtime.      aspirin  81 MG EC tablet Take 81 mg by mouth daily. (Patient not taking: Reported on 02/17/2024)     atorvastatin  (LIPITOR ) 80 MG tablet Take 1 tablet (80 mg total) by mouth daily. 30 tablet 2   citalopram  (CELEXA ) 40 MG tablet Take 40 mg by mouth daily.  11   dexamethasone  (DECADRON ) 4 MG tablet Take 1 tablet (4 mg total) by mouth 3 (three) times daily. 90 tablet 1   feeding supplement (ENSURE ENLIVE / ENSURE PLUS) LIQD Take 237 mLs by mouth 2 (two) times daily between meals. 237 mL 12   HYDROcodone -acetaminophen  (NORCO) 10-325 MG tablet Take 1 tablet by mouth every 4  (four) hours as needed for moderate pain. 20 tablet 0   leptospermum manuka honey (MEDIHONEY) PSTE paste Apply 1 Application topically daily. Cleanse sacral wound with NS, apply Medihoney to wound bed daily, cover with dry gauze and silicone foam Interchangeable with TheraHoney. Apply thin layer (3 mm) to wound. 44 mL 0   levETIRAcetam  (KEPPRA ) 500 MG tablet Take 1 tablet (500 mg total) by mouth 2 (two) times daily. 60 tablet 2   levothyroxine  (SYNTHROID ) 175 MCG tablet Take 175 mcg by mouth daily before breakfast.     lidocaine -prilocaine  (EMLA ) cream Apply 1 application topically as needed. (Patient taking differently: Apply 1 application  topically daily as needed (port access).) 30 g 0   liver oil-zinc  oxide (DESITIN) 40 % ointment Apply topically 2 (two) times daily. Apply to a thin layer of Desitin to intact skin of buttocks 2 times daily and as needed for soiling 397 g 0   midodrine  (PROAMATINE ) 10 MG tablet Take 10 mg by mouth 3 (three) times daily.     nicotine  (NICODERM CQ  - DOSED IN MG/24 HOURS) 14 mg/24hr patch Place 1 patch (14 mg total) onto the skin daily. 28  patch 0   nitroGLYCERIN  (NITROSTAT ) 0.4 MG SL tablet Place 1 tablet (0.4 mg total) under the tongue every 5 (five) minutes x 3 doses as needed for chest pain. 25 tablet 2   polyethylene glycol (MIRALAX  / GLYCOLAX ) 17 g packet Take 17 g by mouth daily. 14 each 0   senna-docusate (SENOKOT-S) 8.6-50 MG tablet Take 2 tablets by mouth 2 (two) times daily. 120 tablet 0   No current facility-administered medications for this visit.    SURGICAL HISTORY:  Past Surgical History:  Procedure Laterality Date   AORTOGRAM  08/08/2009   for LLE claudication     By Dr. Harvey   BELOW KNEE LEG AMPUTATION Right 04/25/2008   BIOPSY N/A 05/30/2014   Procedure: BIOPSY;  Surgeon: Lamar CHRISTELLA Hollingshead, MD;  Location: [facility];  Service: Endoscopy;  Laterality: N/A;   BLADDER TUMOR EXCISION  8/812   CARDIAC CATHETERIZATION N/A 05/26/2016   Procedure: Left  Heart Cath and Coronary Angiography;  Surgeon: Debby DELENA Sor, MD;  Location: [facility] INVASIVE CV LAB;  Service: Cardiovascular;  Laterality: N/A;   CARDIAC CATHETERIZATION N/A 05/26/2016   Procedure: Coronary Stent Intervention;  Surgeon: Debby DELENA Sor, MD;  Location: [facility] INVASIVE CV LAB;  Service: Cardiovascular;  Laterality: N/A;   COLONOSCOPY WITH PROPOFOL  N/A 05/30/2014   MFM:Dpwhoz colonic polyp-likely source of hematochezia-removed as described above   ESOPHAGOGASTRODUODENOSCOPY (EGD) WITH PROPOFOL  N/A 05/30/2014   MFM:Jwumjo and bulbar erosions s/p gastric biopsy. No evidence of portal gastropathy on today's examination.   FEMORAL-TIBIAL BYPASS GRAFT  2009   Right side using non-reversed GSV   By Dr. Harvey SPELLS BYPASS GRAFT  11/24/2007   Right femoral to anterior tibial BPG   by Dr. Harvey   FINGER SURGERY Left    straightened my pinky   HAND TENDON SURGERY Left 2013   Left 5th finger   HERNIA REPAIR     stomach   ICD IMPLANT N/A 03/02/2018   Procedure: ICD IMPLANT;  Surgeon: Inocencio Soyla Lunger, MD;  Location: [facility] INVASIVE CV LAB;  Service: Cardiovascular;  Laterality: N/A;   INCISIONAL HERNIA REPAIR N/A 12/25/2014   Procedure: HERNIA REPAIR INCISIONAL WITH MESH;  Surgeon: Oneil Budge Md, MD;  Location: [facility];  Service: General;  Laterality: N/A;   INSERTION OF MESH N/A 12/25/2014   Procedure: INSERTION OF MESH;  Surgeon: Oneil Budge Md, MD;  Location: [facility];  Service: General;  Laterality: N/A;   IR IMAGING GUIDED PORT INSERTION  04/11/2018   LAPAROSCOPIC CHOLECYSTECTOMY     LOWER EXTREMITY ANGIOGRAM Left 12/30/2015   Procedure: Lower Extremity Angiogram;  Surgeon: Carlin FORBES Harvey, MD;  Location: [facility] INVASIVE CV LAB;  Service: Cardiovascular;  Laterality: Left;   PERIPHERAL VASCULAR CATHETERIZATION N/A 12/30/2015   Procedure: Abdominal Aortogram;  Surgeon: Carlin FORBES Harvey, MD;  Location: [facility] INVASIVE CV LAB;  Service: Cardiovascular;  Laterality: N/A;   POLYPECTOMY  05/30/2014    Procedure: POLYPECTOMY;  Surgeon: Lamar CHRISTELLA Hollingshead, MD;  Location: [facility];  Service: Endoscopy;;   TONSILLECTOMY AND ADENOIDECTOMY  ~ 1970   TYMPANOSTOMY TUBE PLACEMENT Bilateral ~ 1970   VIDEO ASSISTED THORACOSCOPY Left 10/04/2021   Procedure: PERICARDIAL WINDOW VIDEO ASSISTED THORACOSCOPY APPROACH;  Surgeon: Shyrl Linnie KIDD, MD;  Location: [facility];  Service: Thoracic;  Laterality: Left;  pericardial window.  full lateral    REVIEW OF SYSTEMS:  Constitutional: positive for anorexia, fatigue, and weight loss Eyes: negative Ears, nose, mouth, throat, and face: negative Respiratory: positive for dyspnea on  exertion Cardiovascular: negative Gastrointestinal: negative Genitourinary:negative Integument/breast: negative Hematologic/lymphatic: negative Musculoskeletal:negative Neurological: negative Behavioral/Psych: negative Endocrine: negative Allergic/Immunologic: negative   PHYSICAL EXAMINATION: General appearance: alert, cooperative, fatigued, and no distress Head: Normocephalic, without obvious abnormality, atraumatic Neck: no adenopathy, no JVD, supple, symmetrical, trachea midline, and thyroid  not enlarged, symmetric, no tenderness/mass/nodules Lymph nodes: Cervical, supraclavicular, and axillary nodes normal. Resp: clear to auscultation bilaterally Back: symmetric, no curvature. ROM normal. No CVA tenderness. Cardio: regular rate and rhythm, S1, S2 normal, no murmur, click, rub or gallop GI: soft, non-tender; bowel sounds normal; no masses,  no organomegaly Extremities: Right below knee amputation otherwise no edema. Neurologic: Alert and oriented X 3, normal strength and tone. Normal symmetric reflexes. Normal coordination and gait  ECOG PERFORMANCE STATUS: 1 - Symptomatic but completely ambulatory  Blood pressure (!) 88/61, pulse 95, temperature 98.2 F (36.8 C), temperature source Temporal, resp. rate 17, height 6' (1.829 m), weight 137 lb 1.6 oz (62.2 kg), SpO2  99%.  LABORATORY DATA: Lab Results  Component Value Date   WBC 15.0 (H) 03/19/2024   HGB 14.1 03/19/2024   HCT 43.1 03/19/2024   MCV 84.8 03/19/2024   PLT 212 03/19/2024      Chemistry      Component Value Date/Time   NA 130 (L) 02/22/2024 1000   NA 139 02/27/2018 1457   K 4.3 02/22/2024 1000   CL 94 (L) 02/22/2024 1000   CO2 26 02/22/2024 1000   BUN 18 02/22/2024 1000   BUN 7 02/27/2018 1457   CREATININE 0.88 02/22/2024 1000   CREATININE 0.92 10/29/2022 1050   CREATININE 1.13 06/28/2016 1453      Component Value Date/Time   CALCIUM  8.5 (L) 02/22/2024 1000   ALKPHOS 84 02/18/2024 1114   AST 15 02/18/2024 1114   AST 17 10/29/2022 1050   ALT 10 02/18/2024 1114   ALT 17 10/29/2022 1050   BILITOT 0.9 02/18/2024 1114   BILITOT 0.5 10/29/2022 1050       RADIOGRAPHIC STUDIES: MR Brain W and Wo Contrast Result Date: 02/21/2024 CLINICAL DATA:  63 year old male with recent fall. Cerebral vasogenic edema on CT suggesting metastatic disease and right upper lung masslike area. EXAM: MRI HEAD WITHOUT AND WITH CONTRAST TECHNIQUE: Multiplanar, multiecho pulse sequences of the brain and surrounding structures were obtained without and with intravenous contrast. CONTRAST:  8mL GADAVIST  GADOBUTROL  1 MMOL/ML IV SOLN COMPARISON:  Head CT 02/17/2024. Previous staging brain MRI 12/17/2020. FINDINGS: Brain: Multiple enhancing brain metastases. Eighteen separate enhancing metastases are identified ranging from punctate (series 16, image 122 left parietal lobe) up to 24 mm long axis (left cerebellar hemisphere series 16, image 48). Associated vasogenic edema with T2 and FLAIR hyperintensity in the left occipital lobe, left cerebellum tracking to the midline, left posterior parietal lobe, anterior right frontal lobe. Of these, the left cerebellar mass defect is greatest with partial effacement of the 4th ventricle. But no ventriculomegaly. No midline shift. Basilar cisterns remain patent. No dural or  leptomeningeal thickening identified. No superimposed restricted diffusion to suggest acute infarction. No extra-axial collection or acute intracranial hemorrhage. Some of the lesions have minimal associated hemosiderin on SWI. Cervicomedullary junction and pituitary are within normal limits. Vascular: Major intracranial vascular flow voids are stable since 2022, distal left vertebral artery appears dominant. Following contrast the major dural venous sinuses are enhancing and appear to be patent. Skull and upper cervical spine: Compared to 2022 visible bone marrow signal stable and within normal limits. Negative visible cervical spine and spinal cord.  Sinuses/Orbits: Negative orbits. Chronic paranasal sinus retention cysts. Other: Mild chronic mastoid fluid. Visible internal auditory structures appear normal. Negative visible scalp and face. IMPRESSION: 1. Positive for widespread Brain metastases. Eighteen enhancing metastases suspected ranging from punctate (left parietal lobe) up to 24 mm (left cerebellum). 2. Multifocal vasogenic edema associated, most pronounced in the left cerebellum with some posterior fossa mass effect, but no ventriculomegaly, midline shift, or other complicating features at this time. Electronically Signed   By: VEAR Hurst M.D.   On: 02/21/2024 11:43   ECHOCARDIOGRAM LIMITED Result Date: 02/20/2024    ECHOCARDIOGRAM LIMITED REPORT   Patient Name:   DOLAN XIA Date of Exam: 02/20/2024 Medical Rec #:  [id number]   Height:       72.0 in Accession #:    [id number]  Weight:       191.8 lb Date of Birth:  [birth year]   BSA:          2.093 m Patient Age:    63 years    BP:           94/68 mmHg Patient Gender: M           HR:           79 bpm. Exam Location:  Inpatient Procedure: Limited Echo (Both Spectral and Color Flow Doppler were utilized            during procedure). Indications:    Reattempt to see if LVEF can be calculated  History:        Patient has prior history of Echocardiogram  examinations, most                 recent 02/18/2024. CAD, COPD; Risk Factors:Current Smoker.  Sonographer:    Aida Pizza RCS Referring Phys: [phone] [facility]  Sonographer Comments: Technically difficult study due to poor echo windows. IMPRESSIONS  1. Global hypokinesis with abnormal septal motion . Left ventricular ejection fraction, by estimation, is 30 to 35%. The left ventricle has moderately decreased function. The left ventricle demonstrates global hypokinesis. There is mild left ventricular  hypertrophy.  2. Device lead in RV. Right ventricular systolic function is normal. The right ventricular size is normal.  3. The inferior vena cava is normal in size with <50% respiratory variability, suggesting right atrial pressure of 8 mmHg. FINDINGS  Left Ventricle: Global hypokinesis with abnormal septal motion. Left ventricular ejection fraction, by estimation, is 30 to 35%. The left ventricle has moderately decreased function. The left ventricle demonstrates global hypokinesis. There is mild left  ventricular hypertrophy. Right Ventricle: Device lead in RV. The right ventricular size is normal. No increase in right ventricular wall thickness. Right ventricular systolic function is normal. Pericardium: There is no evidence of pericardial effusion. Venous: The inferior vena cava is normal in size with less than 50% respiratory variability, suggesting right atrial pressure of 8 mmHg. Additional Comments: 3D was performed not requiring image post processing on an independent workstation and was indeterminate. A device lead is visualized.  LEFT VENTRICLE PLAX 2D LVIDd:         5.30 cm LVIDs:         4.80 cm LV PW:         1.00 cm LV IVS:        1.20 cm  LV Volumes (MOD) LV vol d, MOD A2C: 69.9 ml LV vol d, MOD A4C: 128.0 ml LV vol s, MOD A2C: 52.0 ml LV vol s, MOD A4C: 88.4 ml LV  SV MOD A2C:     17.9 ml LV SV MOD A4C:     128.0 ml LV SV MOD BP:      32.2 ml LEFT ATRIUM         Index LA diam:    3.10 cm 1.48 cm/m    AORTA Ao Root diam: 3.40 cm Maude Emmer MD Electronically signed by Maude Emmer MD Signature Date/Time: 02/20/2024/1:23:07 PM    Final    ECHOCARDIOGRAM COMPLETE Result Date: 02/18/2024    ECHOCARDIOGRAM REPORT   Patient Name:   DANNIE HATTABAUGH Date of Exam: 02/18/2024 Medical Rec #:  [id number]   Height:       72.0 in Accession #:    [id number]  Weight:       191.8 lb Date of Birth:  1961-03-19   BSA:          2.093 m Patient Age:    63 years    BP:           90/61 mmHg Patient Gender: M           HR:           90 bpm. Exam Location:  Inpatient Procedure: 2D Echo, Cardiac Doppler and Color Doppler (Both Spectral and Color            Flow Doppler were utilized during procedure). Indications:    CHF-Acute Systolic I50.21  History:        Patient has prior history of Echocardiogram examinations, most                 recent 10/08/2021. Cardiomyopathy, Acute MI and CAD, COPD,                 Signs/Symptoms:Hypotension; Risk Factors:Current Smoker.  Sonographer:    Thea Norlander RCS Referring Phys: [id number] JOETTE PEBBLES IMPRESSIONS  1. Limited study due to poor acoustic windows. LV function cannot be assessed.  2. Right ventricular systolic function is normal. The right ventricular size is normal.  3. The aortic valve is grossly normal. Aortic valve regurgitation is not visualized.  4. The inferior vena cava is normal in size with greater than 50% respiratory variability, suggesting right atrial pressure of 3 mmHg. FINDINGS  Left Ventricle: Right Ventricle: The right ventricular size is normal. Right vetricular wall thickness was not well visualized. Right ventricular systolic function is normal. Left Atrium: Left atrial size was normal in size. Tricuspid Valve: The tricuspid valve is grossly normal. Tricuspid valve regurgitation is trivial. Aortic Valve: The aortic valve is grossly normal. Aortic valve regurgitation is not visualized. Pulmonic Valve: The pulmonic valve was grossly normal. Pulmonic valve regurgitation is  trivial. Venous: The inferior vena cava is normal in size with greater than 50% respiratory variability, suggesting right atrial pressure of 3 mmHg.  IVC IVC diam: 1.70 cm Soyla Merck MD Electronically signed by Soyla Merck MD Signature Date/Time: 02/18/2024/1:45:44 PM    Final    EEG adult Result Date: 02/18/2024 Matthews Elida HERO, MD     02/19/2024 12:41 PM Routine EEG Report YAZAN GATLING is a 63 y.o. male with a history of altered mental status and brain tumor who is undergoing an EEG to evaluate for seizures. Report: This EEG was acquired with electrodes placed according to the International 10-20 electrode system (including Fp1, Fp2, F3, F4, C3, C4, P3, P4, O1, O2, T3, T4, T5, T6, A1, A2, Fz, Cz, Pz). The following electrodes were missing or displaced: none. The occipital dominant rhythm was  11 Hz. This activity is reactive to stimulation. Drowsiness was manifested by background fragmentation; deeper stages of sleep were not identified. There was focal slowing over the left temporal region. There were no interictal epileptiform discharges. There were no electrographic seizures identified. Photic stimulation and hyperventilation were not performed. Impression and clinical correlation: This EEG was obtained while awake and drowsy and is abnormal due to focal slowing over the left posterior region (indicating focal cerebral dysfunction). That location corresponds to the area where CT scan showed the most significant abnormality. No epileptiform abnormalities were seen during this recording. Elida Ross, MD [facility] [phone] If 7pm- 7am, please page neurology on call as listed in AMION.    ASSESSMENT AND PLAN: This is a very pleasant 63 years old white male recently diagnosed with a stage IIIa non-small cell lung cancer, adenosquamous carcinoma.  He underwent a course of concurrent chemoradiation with weekly carboplatin  and paclitaxel  status post 6 cycles with partial response.  He  has no actionable mutation but PD-L1 expression is 99%. The patient completed treatment with consolidation immunotherapy with Imfinzi  status post 26 cycles.  He tolerated this treatment well with no concerning adverse effects. The patient was on observation for several months but he developed disease progression. He underwent treatment with systemic immunotherapy with Keytruda  200 mg IV every 3 weeks status post 12 cycles.  He missed a few cycles of his treatment because of traveling to [place]  to take care of his parents.   He tolerated this treatment well but unfortunately had evidence for disease progression. He started systemic chemotherapy with carboplatin  for AUC of 5 and Alimta  500 Mg/M2 status post 10 cycles.  Starting from cycle #6 the patient is on single agent treatment with Alimta  400 Mg/M2 every 3 weeks.  He was lost to follow-up for more than a year because he spent a lot of time in [place]  before returning back to [place] . He was admitted to [facility] on 02/17/2024 with gait imbalance and falls.  He had imaging studies including CT scan of the neck, cervical spine as well as CT scan of the chest, abdomen and pelvis and MRI of the brain.  The MRI of the brain showed positive widespread brain metastasis with 18 enhancing metastasis suspected ranging from punctate up to 2.4 cm in the left cerebellum.  There was multifocal vasogenic edema associated most pronounced in the left cerebellum with some posterior fossa mass effect. CT scan of the chest, abdomen and pelvis showed increasing mass consolidation in the right upper lobe concerning for progression of disease in addition to worsening pulmonary and nodal metastasis.   Assessment and Plan    Metastatic adenocarcinoma of the lung Metastatic adenocarcinoma of the lung with progression in the right lung and brain metastases. Previously treated with carboplatin  and Alimta , discontinued due to toxicity. No treatment for over a year.  Interested in resuming chemotherapy. Completed whole brain radiation. Discussed potential chemotherapy side effects, including nausea and vomiting. Emphasized family and caregiver support during treatment. Consider hospice care if treatment is ineffective or poorly tolerated. - Resume chemotherapy with carboplatin  and Alimta  every three weeks. - Administer B12 injection today. - Prescribe daily folic acid . - Prescribe Decadron  for the day before, the day of, and the day after chemotherapy. - Provide antiemetic for home use. - Coordinate with Child psychotherapist for transportation and support services. - Arrange lab work between chemotherapy sessions.  Cachexia Cachexia likely secondary to advanced metastatic disease with noted weight loss.  Goals of  Care He is not ready to transition to hospice care and prefers to continue active treatment. Family support is crucial in his decision to resume chemotherapy. Hospice care will be considered if treatment is ineffective or poorly tolerated.    For the hypertension, he took a lot of medication earlier today including Xanax .  He will monitor it closely at home. For the hypothyroidism, he will continue his current treatment with levothyroxine . The patient was advised to call immediately if he has any concerning symptoms in the interval. The patient voices understanding of current disease status and treatment options and is in agreement with the current care plan. All questions were answered. The patient knows to call the clinic with any problems, questions or concerns. We can certainly see the patient much sooner if necessary.  The total time spent in the appointment was 30 minutes.  Disclaimer: This note was dictated with voice recognition software. Similar sounding words can inadvertently be transcribed and may not be corrected upon review.

## 2024-03-19 NOTE — Progress Notes (Signed)
 ON PATHWAY REGIMEN - Non-Small Cell Lung  No Change  Continue With Treatment as Ordered.  Original Decision Date/Time: 02/24/2021 16:42     A cycle is every 21 days:     Pemetrexed       Carboplatin    **Always confirm dose/schedule in your pharmacy ordering system**  Patient Characteristics: Stage IV Metastatic, Nonsquamous, Molecular Analysis Completed, Molecular Alteration Present and Targeted Therapy Exhausted OR EGFR Exon 20+ or KRAS G12C+ Present and No Prior Chemo/Immunotherapy OR No Alteration Present, Initial  Chemotherapy/Immunotherapy, PS = 0, 1, No Alteration Present, No Alteration Present, Not a Candidate for Immunotherapy Therapeutic Status: Stage IV Metastatic Histology: Nonsquamous Cell Broad Molecular Profiling Status: Animal nutritionist Analysis Results: No Alteration Present ECOG Performance Status: 1 Chemotherapy/Immunotherapy Line of Therapy: Initial Chemotherapy/Immunotherapy EGFR Exons 18-21 Mutation Testing Status: Completed and Negative ALK Fusion/Rearrangement Testing Status: Completed and Negative BRAF V600 Mutation Testing Status: Completed and Negative KRAS G12C Mutation Testing Status: Completed and Negative MET Exon 14 Mutation Testing Status: Completed and Negative RET Fusion/Rearrangement Testing Status: Completed and Negative NTRK Fusion/Rearrangement Testing Status: Completed and Negative ROS1 Fusion/Rearrangement Testing Status: Completed and Negative Immunotherapy Candidate Status: Not a Candidate for Immunotherapy Intent of Therapy: Non-Curative / Palliative Intent, Discussed with Patient

## 2024-03-19 NOTE — Patient Instructions (Signed)
 Vitamen b 12

## 2024-03-19 NOTE — Progress Notes (Signed)
 Pharmacist Chemotherapy Monitoring - Initial Assessment    Anticipated start date: 03/26/24   The following has been reviewed per standard work regarding the patient's treatment regimen: The patient's diagnosis, treatment plan and drug doses, and organ/hematologic function Lab orders and baseline tests specific to treatment regimen  The treatment plan start date, drug sequencing, and pre-medications Prior authorization status  Patient's documented medication list, including drug-drug interaction screen and prescriptions for anti-emetics and supportive care specific to the treatment regimen The drug concentrations, fluid compatibility, administration routes, and timing of the medications to be used The patient's access for treatment and lifetime cumulative dose history, if applicable  The patient's medication allergies and previous infusion related reactions, if applicable   Changes made to treatment plan:  Incr premed per [facility] protocol  Follow up needed:  Pending authorization for treatment    [name], Pharm.D., CPP 03/19/2024@4 :59 PM

## 2024-03-20 ENCOUNTER — Inpatient Hospital Stay: Admitting: Licensed Clinical Social Worker

## 2024-03-20 DIAGNOSIS — L89312 Pressure ulcer of right buttock, stage 2: Secondary | ICD-10-CM | POA: Diagnosis not present

## 2024-03-20 DIAGNOSIS — Z9181 History of falling: Secondary | ICD-10-CM | POA: Diagnosis not present

## 2024-03-20 DIAGNOSIS — Z89521 Acquired absence of right knee: Secondary | ICD-10-CM | POA: Diagnosis not present

## 2024-03-20 DIAGNOSIS — C3490 Malignant neoplasm of unspecified part of unspecified bronchus or lung: Secondary | ICD-10-CM | POA: Diagnosis not present

## 2024-03-20 DIAGNOSIS — Z95810 Presence of automatic (implantable) cardiac defibrillator: Secondary | ICD-10-CM | POA: Diagnosis not present

## 2024-03-20 DIAGNOSIS — C3491 Malignant neoplasm of unspecified part of right bronchus or lung: Secondary | ICD-10-CM

## 2024-03-20 DIAGNOSIS — Z86718 Personal history of other venous thrombosis and embolism: Secondary | ICD-10-CM | POA: Diagnosis not present

## 2024-03-20 DIAGNOSIS — J4489 Other specified chronic obstructive pulmonary disease: Secondary | ICD-10-CM | POA: Diagnosis not present

## 2024-03-20 DIAGNOSIS — I130 Hypertensive heart and chronic kidney disease with heart failure and stage 1 through stage 4 chronic kidney disease, or unspecified chronic kidney disease: Secondary | ICD-10-CM | POA: Diagnosis not present

## 2024-03-20 DIAGNOSIS — Z7982 Long term (current) use of aspirin: Secondary | ICD-10-CM | POA: Diagnosis not present

## 2024-03-20 DIAGNOSIS — I252 Old myocardial infarction: Secondary | ICD-10-CM | POA: Diagnosis not present

## 2024-03-20 DIAGNOSIS — E7800 Pure hypercholesterolemia, unspecified: Secondary | ICD-10-CM | POA: Diagnosis not present

## 2024-03-20 DIAGNOSIS — I2510 Atherosclerotic heart disease of native coronary artery without angina pectoris: Secondary | ICD-10-CM | POA: Diagnosis not present

## 2024-03-20 DIAGNOSIS — E039 Hypothyroidism, unspecified: Secondary | ICD-10-CM | POA: Diagnosis not present

## 2024-03-20 DIAGNOSIS — F17210 Nicotine dependence, cigarettes, uncomplicated: Secondary | ICD-10-CM | POA: Diagnosis not present

## 2024-03-20 DIAGNOSIS — N189 Chronic kidney disease, unspecified: Secondary | ICD-10-CM | POA: Diagnosis not present

## 2024-03-20 DIAGNOSIS — J439 Emphysema, unspecified: Secondary | ICD-10-CM | POA: Diagnosis not present

## 2024-03-20 DIAGNOSIS — I5022 Chronic systolic (congestive) heart failure: Secondary | ICD-10-CM | POA: Diagnosis not present

## 2024-03-20 DIAGNOSIS — G5791 Unspecified mononeuropathy of right lower limb: Secondary | ICD-10-CM | POA: Diagnosis not present

## 2024-03-20 DIAGNOSIS — C7931 Secondary malignant neoplasm of brain: Secondary | ICD-10-CM | POA: Diagnosis not present
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
## 2024-03-22 DIAGNOSIS — I2510 Atherosclerotic heart disease of native coronary artery without angina pectoris: Secondary | ICD-10-CM | POA: Diagnosis not present

## 2024-03-22 DIAGNOSIS — Z89521 Acquired absence of right knee: Secondary | ICD-10-CM | POA: Diagnosis not present

## 2024-03-22 DIAGNOSIS — G5791 Unspecified mononeuropathy of right lower limb: Secondary | ICD-10-CM | POA: Diagnosis not present

## 2024-03-22 DIAGNOSIS — I5022 Chronic systolic (congestive) heart failure: Secondary | ICD-10-CM | POA: Diagnosis not present

## 2024-03-22 DIAGNOSIS — I252 Old myocardial infarction: Secondary | ICD-10-CM | POA: Diagnosis not present

## 2024-03-22 DIAGNOSIS — F17210 Nicotine dependence, cigarettes, uncomplicated: Secondary | ICD-10-CM | POA: Diagnosis not present

## 2024-03-22 DIAGNOSIS — Z7982 Long term (current) use of aspirin: Secondary | ICD-10-CM | POA: Diagnosis not present

## 2024-03-22 DIAGNOSIS — C3491 Malignant neoplasm of unspecified part of right bronchus or lung: Secondary | ICD-10-CM | POA: Diagnosis not present

## 2024-03-22 DIAGNOSIS — C7931 Secondary malignant neoplasm of brain: Secondary | ICD-10-CM | POA: Diagnosis not present

## 2024-03-22 DIAGNOSIS — C3490 Malignant neoplasm of unspecified part of unspecified bronchus or lung: Secondary | ICD-10-CM | POA: Diagnosis not present

## 2024-03-22 DIAGNOSIS — E039 Hypothyroidism, unspecified: Secondary | ICD-10-CM | POA: Diagnosis not present

## 2024-03-22 DIAGNOSIS — Z86718 Personal history of other venous thrombosis and embolism: Secondary | ICD-10-CM | POA: Diagnosis not present

## 2024-03-22 DIAGNOSIS — J439 Emphysema, unspecified: Secondary | ICD-10-CM | POA: Diagnosis not present

## 2024-03-22 DIAGNOSIS — Z95810 Presence of automatic (implantable) cardiac defibrillator: Secondary | ICD-10-CM | POA: Diagnosis not present

## 2024-03-22 DIAGNOSIS — N189 Chronic kidney disease, unspecified: Secondary | ICD-10-CM | POA: Diagnosis not present

## 2024-03-22 DIAGNOSIS — E7800 Pure hypercholesterolemia, unspecified: Secondary | ICD-10-CM | POA: Diagnosis not present

## 2024-03-22 DIAGNOSIS — I130 Hypertensive heart and chronic kidney disease with heart failure and stage 1 through stage 4 chronic kidney disease, or unspecified chronic kidney disease: Secondary | ICD-10-CM | POA: Diagnosis not present

## 2024-03-22 DIAGNOSIS — L89312 Pressure ulcer of right buttock, stage 2: Secondary | ICD-10-CM | POA: Diagnosis not present

## 2024-03-22 DIAGNOSIS — Z9181 History of falling: Secondary | ICD-10-CM | POA: Diagnosis not present

## 2024-03-22 DIAGNOSIS — J4489 Other specified chronic obstructive pulmonary disease: Secondary | ICD-10-CM | POA: Diagnosis not present

## 2024-03-23 ENCOUNTER — Inpatient Hospital Stay: Admitting: General Practice

## 2024-03-23 MED FILL — Fosaprepitant Dimeglumine For IV Infusion 150 MG (Base Eq): INTRAVENOUS | Qty: 5 | Status: AC

## 2024-03-23 NOTE — Progress Notes (Signed)
 [facility] Healthcare Advance Directives Spiritual Care  Patient presented to Advance Directives Clinic  to review and complete healthcare advance directives.  Clinical Chaplain met with patient, daughter Rosina Gander, and close friend Erminio.  The patient designated Rosina Almarie Gander of [place] [phone]) as their primary healthcare agent and Sonny Jenkins Sprang of [place] [phone]) as their secondary agent.  Patient also completed healthcare living will.    Documents were notarized and copies made for patient/family. Chaplain will send documents to medical records to be scanned into patient's chart. Chaplain encouraged patient/family to contact with any additional questions or concerns.   [address] [place], [place], [facility] Pager [phone] Voicemail [phone]

## 2024-03-25 ENCOUNTER — Emergency Department (HOSPITAL_COMMUNITY)
Admission: EM | Admit: 2024-03-25 | Discharge: 2024-03-25 | Disposition: A | Discharge status: Home / Self Care | Attending: Emergency Medicine | Admitting: Emergency Medicine

## 2024-03-25 DIAGNOSIS — C7931 Secondary malignant neoplasm of brain: Secondary | ICD-10-CM | POA: Insufficient documentation

## 2024-03-25 DIAGNOSIS — C3490 Malignant neoplasm of unspecified part of unspecified bronchus or lung: Secondary | ICD-10-CM | POA: Diagnosis not present

## 2024-03-25 DIAGNOSIS — R739 Hyperglycemia, unspecified: Secondary | ICD-10-CM | POA: Diagnosis not present

## 2024-03-25 DIAGNOSIS — R159 Full incontinence of feces: Secondary | ICD-10-CM | POA: Insufficient documentation

## 2024-03-25 DIAGNOSIS — J449 Chronic obstructive pulmonary disease, unspecified: Secondary | ICD-10-CM | POA: Insufficient documentation

## 2024-03-25 DIAGNOSIS — I2510 Atherosclerotic heart disease of native coronary artery without angina pectoris: Secondary | ICD-10-CM | POA: Diagnosis not present

## 2024-03-25 DIAGNOSIS — R531 Weakness: Secondary | ICD-10-CM | POA: Diagnosis not present

## 2024-03-25 DIAGNOSIS — R6889 Other general symptoms and signs: Secondary | ICD-10-CM | POA: Diagnosis not present

## 2024-03-25 DIAGNOSIS — E876 Hypokalemia: Secondary | ICD-10-CM | POA: Diagnosis not present

## 2024-03-25 DIAGNOSIS — Z95810 Presence of automatic (implantable) cardiac defibrillator: Secondary | ICD-10-CM | POA: Diagnosis not present

## 2024-03-25 DIAGNOSIS — R197 Diarrhea, unspecified: Secondary | ICD-10-CM | POA: Diagnosis not present

## 2024-03-25 DIAGNOSIS — Z7982 Long term (current) use of aspirin: Secondary | ICD-10-CM | POA: Diagnosis not present

## 2024-03-25 LAB — CBC WITH DIFFERENTIAL/PLATELET
Abs Immature Granulocytes: 0.27 10*3/uL — ABNORMAL HIGH (ref 0.00–0.07)
Basophils Absolute: 0.1 10*3/uL (ref 0.0–0.1)
Basophils Relative: 1 %
Eosinophils Absolute: 0.1 10*3/uL (ref 0.0–0.5)
Eosinophils Relative: 1 %
HCT: 43.1 % (ref 39.0–52.0)
Hemoglobin: 13.7 g/dL (ref 13.0–17.0)
Immature Granulocytes: 3 %
Lymphocytes Relative: 7 %
Lymphs Abs: 0.7 10*3/uL (ref 0.7–4.0)
MCH: 28 pg (ref 26.0–34.0)
MCHC: 31.8 g/dL (ref 30.0–36.0)
MCV: 88.1 fL (ref 80.0–100.0)
Monocytes Absolute: 0.8 10*3/uL (ref 0.1–1.0)
Monocytes Relative: 8 %
Neutro Abs: 7.7 10*3/uL (ref 1.7–7.7)
Neutrophils Relative %: 80 %
Platelets: 214 10*3/uL (ref 150–400)
RBC: 4.89 MIL/uL (ref 4.22–5.81)
RDW: 16.8 % — ABNORMAL HIGH (ref 11.5–15.5)
WBC: 9.6 10*3/uL (ref 4.0–10.5)
nRBC: 0 % (ref 0.0–0.2)

## 2024-03-25 LAB — URINALYSIS, ROUTINE W REFLEX MICROSCOPIC
Bilirubin Urine: NEGATIVE
Glucose, UA: NEGATIVE mg/dL
Hgb urine dipstick: NEGATIVE
Ketones, ur: NEGATIVE mg/dL
Nitrite: NEGATIVE
Protein, ur: NEGATIVE mg/dL
Specific Gravity, Urine: 1.008 (ref 1.005–1.030)
pH: 6 (ref 5.0–8.0)

## 2024-03-25 LAB — COMPREHENSIVE METABOLIC PANEL WITH GFR
ALT: 23 U/L (ref 0–44)
AST: 19 U/L (ref 15–41)
Albumin: 3.1 g/dL — ABNORMAL LOW (ref 3.5–5.0)
Alkaline Phosphatase: 90 U/L (ref 38–126)
Anion gap: 10 (ref 5–15)
BUN: 9 mg/dL (ref 8–23)
CO2: 28 mmol/L (ref 22–32)
Calcium: 8.1 mg/dL — ABNORMAL LOW (ref 8.9–10.3)
Chloride: 97 mmol/L — ABNORMAL LOW (ref 98–111)
Creatinine, Ser: 0.63 mg/dL (ref 0.61–1.24)
GFR, Estimated: >60 mL/min (ref >60)
Glucose, Bld: 108 mg/dL — ABNORMAL HIGH (ref 70–99)
Potassium: 2.5 mmol/L — CL (ref 3.5–5.1)
Sodium: 135 mmol/L (ref 135–145)
Total Bilirubin: 0.8 mg/dL (ref 0.0–1.2)
Total Protein: 6.3 g/dL — ABNORMAL LOW (ref 6.5–8.1)

## 2024-03-25 LAB — MAGNESIUM: Magnesium: 1.9 mg/dL (ref 1.7–2.4)

## 2024-03-25 LAB — I-STAT CG4 LACTIC ACID, ED: Lactic Acid, Venous: 1.1 mmol/L (ref 0.5–1.9)

## 2024-03-25 LAB — LIPASE, BLOOD: Lipase: 27 U/L (ref 11–51)

## 2024-03-25 MED ORDER — POTASSIUM CHLORIDE 10 MEQ/100ML IV SOLN
10.0000 meq | Freq: Once | INTRAVENOUS | Status: AC
  Administered 2024-03-25: 10 meq via INTRAVENOUS
  Filled 2024-03-25: qty 100

## 2024-03-25 MED ORDER — POTASSIUM CHLORIDE CRYS ER 20 MEQ PO TBCR: 20.0000 meq | EXTENDED_RELEASE_TABLET | Freq: Two times a day (BID) | ORAL | 0 refills | Status: DC

## 2024-03-25 MED ORDER — POTASSIUM CHLORIDE CRYS ER 20 MEQ PO TBCR
40.0000 meq | EXTENDED_RELEASE_TABLET | Freq: Once | ORAL | Status: AC
  Administered 2024-03-25: 40 meq via ORAL
  Filled 2024-03-25: qty 2

## 2024-03-25 MED ORDER — SODIUM CHLORIDE 0.9 % IV BOLUS
1000.0000 mL | Freq: Once | INTRAVENOUS | Status: AC
  Administered 2024-03-25: 1000 mL via INTRAVENOUS

## 2024-03-25 NOTE — ED Triage Notes (Signed)
 Patient in today reporting loose stools x1 week. Cancer patient. Denies vomiting. Alert per norm.

## 2024-03-25 NOTE — ED Provider Notes (Signed)
 [place] EMERGENCY DEPARTMENT AT [facility] Provider Note   CSN: [id number] Arrival date & time: 03/25/24  9090     Patient presents with: Diarrhea   [name] is a 63 y.o. male.   Patient presents reporting that for the past 3 days he has lost continence of bowel each day. He states he will dose off in bed or on the couch and wake to find he has soiled himself. No pain. Nonbloody stools. No urinary issues. This has not occurred when awake. He is currently being treated for metastatic non-small cell lung CA, recent brain radiation for lesions and restarted on chemo with oncology. No fever, cough, SOB, vomiting.  The history is provided by the patient. No language interpreter was used.  Diarrhea      Prior to Admission medications   Medication Sig Start Date End Date Taking? Authorizing Provider  potassium chloride  SA (KLOR-CON  M) 20 MEQ tablet Take 1 tablet (20 mEq total) by mouth 2 (two) times daily. 03/25/24  Yes Diannie Willner, Margit, PA-C  albuterol  (VENTOLIN  HFA) 108 (90 Base) MCG/ACT inhaler Inhale 1-2 puffs into the lungs every 4 (four) hours as needed for wheezing or shortness of breath. 06/30/20   [provider]  alprazolam  (XANAX ) 2 MG tablet Take 2 mg by mouth 4 (four) times daily as needed for anxiety.    [provider]  amitriptyline  (ELAVIL ) 100 MG tablet Take 100 mg by mouth at bedtime.     [provider]  aspirin  81 MG EC tablet Take 81 mg by mouth daily.    [provider]  atorvastatin  (LIPITOR ) 80 MG tablet Take 1 tablet (80 mg total) by mouth daily. 02/24/24   Hongalgi, Anand D, MD  citalopram  (CELEXA ) 40 MG tablet Take 40 mg by mouth daily. 12/02/14   [provider]  dexamethasone  (DECADRON ) 4 MG tablet Take 1 tablet (4 mg total) by mouth 3 (three) times daily. 02/21/24   Lanell Donald Stagger, PA-C  dexamethasone  (DECADRON ) 4 MG tablet Take 1 tab by mouth 2 times daily starting day before pemetrexed . Then take  2 tabs daily x 3 days starting day after chemo. Take with food. 03/19/24   Sherrod Sherrod, MD  feeding supplement (ENSURE ENLIVE / ENSURE PLUS) LIQD Take 237 mLs by mouth 2 (two) times daily between meals. 02/24/24   Hongalgi, Anand D, MD  folic acid  (FOLVITE ) 1 MG tablet Take 1 tablet (1 mg total) by mouth daily. Start 7 days before pemetrexed  chemotherapy. Continue until 21 days after pemetrexed  completed. 03/19/24   Sherrod Sherrod, MD  HYDROcodone -acetaminophen  [facility]) 10-325 MG tablet Take 1 tablet by mouth every 4 (four) hours as needed for moderate pain. 10/09/21   Rai, Ripudeep K, MD  leptospermum manuka honey (MEDIHONEY) PSTE paste Apply 1 Application topically daily. Cleanse sacral wound with NS, apply Medihoney to wound bed daily, cover with dry gauze and silicone foam Interchangeable with TheraHoney. Apply thin layer (3 mm) to wound. 02/25/24   Hongalgi, Anand D, MD  levETIRAcetam  (KEPPRA ) 500 MG tablet Take 1 tablet (500 mg total) by mouth 2 (two) times daily. 02/24/24   Hongalgi, Anand D, MD  levothyroxine  (SYNTHROID ) 175 MCG tablet Take 175 mcg by mouth daily before breakfast.    [provider]  lidocaine -prilocaine  (EMLA ) cream Apply 1 application topically as needed. Patient taking differently: Apply 1 application  topically daily as needed (port access). 05/05/21   Sherrod Sherrod, MD  lidocaine -prilocaine  (EMLA ) cream Apply to affected area once  03/19/24   Sherrod Sherrod, MD  liver oil-zinc  oxide (DESITIN) 40 % ointment Apply topically 2 (two) times daily. Apply to a thin layer of Desitin to intact skin of buttocks 2 times daily and as needed for soiling 02/24/24   Hongalgi, Anand D, MD  midodrine  (PROAMATINE ) 10 MG tablet Take 10 mg by mouth 3 (three) times daily.    [provider]  nicotine  (NICODERM CQ  - DOSED IN MG/24 HOURS) 14 mg/24hr patch Place 1 patch (14 mg total) onto the skin daily. 02/25/24   Hongalgi, Anand D, MD  nitroGLYCERIN  (NITROSTAT ) 0.4 MG SL tablet  Place 1 tablet (0.4 mg total) under the tongue every 5 (five) minutes x 3 doses as needed for chest pain. 05/28/16   Smith, Erin E, NP  ondansetron  (ZOFRAN ) 8 MG tablet Take 1 tablet (8 mg total) by mouth every 8 (eight) hours as needed for nausea or vomiting. Start on the third day after chemotherapy. 03/19/24   Sherrod Sherrod, MD  polyethylene glycol (MIRALAX  / GLYCOLAX ) 17 g packet Take 17 g by mouth daily. 02/25/24   Hongalgi, Anand D, MD  senna-docusate (SENOKOT-S) 8.6-50 MG tablet Take 2 tablets by mouth 2 (two) times daily. 02/24/24   Hongalgi, Trenda BIRCH, MD    Allergies: Lyrica [pregabalin] and Neurontin [gabapentin]    Review of Systems  Gastrointestinal:  Positive for diarrhea.    Updated Vital Signs BP 95/67   Pulse 77   Temp 98.5 F (36.9 C) (Oral)   Resp (!) 26   SpO2 99%   Physical Exam Vitals and nursing note reviewed.  Constitutional:      Comments: Cachectic appearing patient, awake, alert.   HENT:     Head: Normocephalic.     Nose: Nose normal.     Mouth/Throat:     Mouth: Mucous membranes are moist.   Cardiovascular:     Rate and Rhythm: Normal rate and regular rhythm.  Pulmonary:     Effort: Pulmonary effort is normal.  Abdominal:     General: There is no distension.     Palpations: Abdomen is soft.     Tenderness: There is no abdominal tenderness.   Musculoskeletal:     Cervical back: Normal range of motion and neck supple.   Skin:    General: Skin is warm and dry.   Neurological:     Mental Status: He is alert and oriented to person, place, and time.     (all labs ordered are listed, but only abnormal results are displayed) Labs Reviewed  CBC WITH DIFFERENTIAL/PLATELET - Abnormal; Notable for the following components:      Result Value   RDW 16.8 (*)    Abs Immature Granulocytes 0.27 (*)    All other components within normal limits  COMPREHENSIVE METABOLIC PANEL WITH GFR - Abnormal; Notable for the following components:   Potassium 2.5 (*)     Chloride 97 (*)    Glucose, Bld 108 (*)    Calcium  8.1 (*)    Total Protein 6.3 (*)    Albumin  3.1 (*)    All other components within normal limits  LIPASE, BLOOD  MAGNESIUM   URINALYSIS, ROUTINE W REFLEX MICROSCOPIC  I-STAT CG4 LACTIC ACID, ED   Results for orders placed or performed during the hospital encounter of 03/25/24  CBC with Differential   Collection Time: 03/25/24 10:19 AM  Result Value Ref Range   WBC 9.6 4.0 - 10.5 K/uL   RBC 4.89 4.22 - 5.81 MIL/uL  Hemoglobin 13.7 13.0 - 17.0 g/dL   HCT 56.8 60.9 - 47.9 %   MCV 88.1 80.0 - 100.0 fL   MCH 28.0 26.0 - 34.0 pg   MCHC 31.8 30.0 - 36.0 g/dL   RDW 83.1 (H) 88.4 - 84.4 %   Platelets 214 150 - 400 K/uL   nRBC 0.0 0.0 - 0.2 %   Neutrophils Relative % 80 %   Neutro Abs 7.7 1.7 - 7.7 K/uL   Lymphocytes Relative 7 %   Lymphs Abs 0.7 0.7 - 4.0 K/uL   Monocytes Relative 8 %   Monocytes Absolute 0.8 0.1 - 1.0 K/uL   Eosinophils Relative 1 %   Eosinophils Absolute 0.1 0.0 - 0.5 K/uL   Basophils Relative 1 %   Basophils Absolute 0.1 0.0 - 0.1 K/uL   Immature Granulocytes 3 %   Abs Immature Granulocytes 0.27 (H) 0.00 - 0.07 K/uL  Comprehensive metabolic panel   Collection Time: 03/25/24 10:19 AM  Result Value Ref Range   Sodium 135 135 - 145 mmol/L   Potassium 2.5 (LL) 3.5 - 5.1 mmol/L   Chloride 97 (L) 98 - 111 mmol/L   CO2 28 22 - 32 mmol/L   Glucose, Bld 108 (H) 70 - 99 mg/dL   BUN 9 8 - 23 mg/dL   Creatinine, Ser 9.36 0.61 - 1.24 mg/dL   Calcium  8.1 (L) 8.9 - 10.3 mg/dL   Total Protein 6.3 (L) 6.5 - 8.1 g/dL   Albumin  3.1 (L) 3.5 - 5.0 g/dL   AST 19 15 - 41 U/L   ALT 23 0 - 44 U/L   Alkaline Phosphatase 90 38 - 126 U/L   Total Bilirubin 0.8 0.0 - 1.2 mg/dL   GFR, Estimated >39 >39 mL/min   Anion gap 10 5 - 15  Lipase, blood   Collection Time: 03/25/24 10:19 AM  Result Value Ref Range   Lipase 27 11 - 51 U/L  Magnesium    Collection Time: 03/25/24 10:19 AM  Result Value Ref Range   Magnesium  1.9 1.7 -  2.4 mg/dL  I-Stat Lactic Acid   Collection Time: 03/25/24 10:29 AM  Result Value Ref Range   Lactic Acid, Venous 1.1 0.5 - 1.9 mmol/L   *Note: Due to a large number of results and/or encounters for the requested time period, some results have not been displayed. A complete set of results can be found in Results Review.    EKG: None  Radiology: No results found.   Procedures   Medications Ordered in the ED  sodium chloride  0.9 % bolus 1,000 mL (0 mLs Intravenous Stopped 03/25/24 1133)  potassium chloride  10 mEq in 100 mL IVPB (0 mEq Intravenous Stopped 03/25/24 1231)  potassium chloride  SA (KLOR-CON  M) CR tablet 40 mEq (40 mEq Oral Given 03/25/24 1131)  potassium chloride  10 mEq in 100 mL IVPB (0 mEq Intravenous Stopped 03/25/24 1355)                                    Medical Decision Making This patient presents to the ED for concern of incontinence, this involves an extensive number of treatment options, and is a complaint that carries with it a high risk of complications and morbidity.  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 Apparently lost to follow up until May 2025 when hospitalized for falls, found to have disease progression, including brain metastasis. Per Dr. 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 Previously treated with carboplatin  and Alimta , discontinued due to toxicity. No treatment for over a year. Interested in resuming chemotherapy. Completed whole brain radiation. Discussed potential chemotherapy side effects, including nausea and vomiting. Emphasized family and  caregiver support during treatment. Consider hospice care if treatment is ineffective or poorly tolerated. - Resume chemotherapy with carboplatin  and Alimta  every three weeks.    Lab Tests:  I Ordered, and personally interpreted labs.  The pertinent results include:   CBC - normal WBC, normal hgb, normal plts Cmet: K+ 2.5, Cal 8.1, alb 3.1, otherwise normal values Lactic acid: 1.1 Magnesium  1.9    Imaging Studies ordered:  I ordered imaging studies including n/a I independently visualized and interpreted imaging which showed n/a I agree with the radiologist interpretation   Cardiac Monitoring:  The patient was maintained on a cardiac monitor.  I personally viewed and interpreted the cardiac monitored which showed an underlying rhythm of: n/a    Test Considered:  N/a   Critical Interventions:  N/a   Consultations Obtained:  I requested consultation with the n/a,  and discussed lab and imaging findings as well as pertinent plan - they recommend: n/a   Problem List / ED Course:  Here with bowel incontinence while sleeping each day for past 3 days No pain, bloody stools, vomiting, fever.  He is eating and drinking as usual Potassium 2.5 - 2 runs 10 mEq IV, 40 mEq PO given in ED BP low, averaging 95 systolic. Similar on previous medical charts.  The patient does not want to be admitted.  No cause suspected for his episodes of incontinence - has metastatic disease, bone lesion/cord compromise considered, however, CT 02/17/24 of abd shows no lytic lesions in spine. No awake incontinence.    Reevaluation:  After the interventions noted above, I reevaluated the patient and found that they have :improved   Social Determinants of Health:  Continuous smoker   Disposition:  After consideration of the diagnostic results and the patients response to treatment, I feel that the patient would benefit from discharge home.   Amount and/or Complexity of Data  Reviewed Labs: ordered.  Risk Prescription drug management.        Final diagnoses:  Incontinence of feces, unspecified fecal incontinence type  Hypokalemia    ED Discharge Orders          Ordered    potassium chloride  SA (KLOR-CON  M) 20 MEQ tablet  2 times daily        03/25/24 1410               Odell Balls, PA-C 03/25/24 1413    Mannie Pac T, DO 03/31/24 1502

## 2024-03-25 NOTE — Discharge Instructions (Signed)
 Your labs show a very low potassium. Thank you for waiting until this was supplemented through the IV. You will need to continue oral potassium for another 5 days. This prescription was sent to your pharmacy.   Your blood pressure is somewhat low in the ED. It has been on previous office visits as well. If you experience any lightheadedness or a feeling like you're going to pass out, return to the ED to have this investigated.   Follow up with your doctor for ongoing cancer treatment. If your incontinence becomes worse: starts occurring while awake, includes urinary incontinence, includes abdominal pain or bloody stools, return to the ED for further evaluation.

## 2024-03-26 ENCOUNTER — Inpatient Hospital Stay

## 2024-03-26 ENCOUNTER — Telehealth: Payer: Self-pay

## 2024-03-26 NOTE — Telephone Encounter (Signed)
 Spoke with patient's daughter, Rosina, regarding missed appointments today. Rosina stated that when the patient is around her mother, he expresses that he does not want to continue with treatment. However, when the patient is with me, he states that he does want to continue treatments. Rosina informed me that the patient is currently with her mother and will be returning to her care tomorrow. She plans to call our office at that time to confirm the patient's decision regarding continuing treatment.   Rosina also stated that the patient resides with her, as his mother is unable to care for him properly.

## 2024-03-29 DIAGNOSIS — F17210 Nicotine dependence, cigarettes, uncomplicated: Secondary | ICD-10-CM | POA: Diagnosis not present

## 2024-03-29 DIAGNOSIS — Z89521 Acquired absence of right knee: Secondary | ICD-10-CM | POA: Diagnosis not present

## 2024-03-29 DIAGNOSIS — Z86718 Personal history of other venous thrombosis and embolism: Secondary | ICD-10-CM | POA: Diagnosis not present

## 2024-03-29 DIAGNOSIS — I130 Hypertensive heart and chronic kidney disease with heart failure and stage 1 through stage 4 chronic kidney disease, or unspecified chronic kidney disease: Secondary | ICD-10-CM | POA: Diagnosis not present

## 2024-03-29 DIAGNOSIS — J4489 Other specified chronic obstructive pulmonary disease: Secondary | ICD-10-CM | POA: Diagnosis not present

## 2024-03-29 DIAGNOSIS — C7931 Secondary malignant neoplasm of brain: Secondary | ICD-10-CM | POA: Diagnosis not present

## 2024-03-29 DIAGNOSIS — E7800 Pure hypercholesterolemia, unspecified: Secondary | ICD-10-CM | POA: Diagnosis not present

## 2024-03-29 DIAGNOSIS — N189 Chronic kidney disease, unspecified: Secondary | ICD-10-CM | POA: Diagnosis not present

## 2024-03-29 DIAGNOSIS — G5791 Unspecified mononeuropathy of right lower limb: Secondary | ICD-10-CM | POA: Diagnosis not present

## 2024-03-29 DIAGNOSIS — J439 Emphysema, unspecified: Secondary | ICD-10-CM | POA: Diagnosis not present

## 2024-03-29 DIAGNOSIS — Z7982 Long term (current) use of aspirin: Secondary | ICD-10-CM | POA: Diagnosis not present

## 2024-03-29 DIAGNOSIS — C3491 Malignant neoplasm of unspecified part of right bronchus or lung: Secondary | ICD-10-CM | POA: Diagnosis not present

## 2024-03-29 DIAGNOSIS — Z95810 Presence of automatic (implantable) cardiac defibrillator: Secondary | ICD-10-CM | POA: Diagnosis not present

## 2024-03-29 DIAGNOSIS — C3490 Malignant neoplasm of unspecified part of unspecified bronchus or lung: Secondary | ICD-10-CM | POA: Diagnosis not present

## 2024-03-29 DIAGNOSIS — L89312 Pressure ulcer of right buttock, stage 2: Secondary | ICD-10-CM | POA: Diagnosis not present

## 2024-03-29 DIAGNOSIS — I5022 Chronic systolic (congestive) heart failure: Secondary | ICD-10-CM | POA: Diagnosis not present

## 2024-03-29 DIAGNOSIS — Z9181 History of falling: Secondary | ICD-10-CM | POA: Diagnosis not present

## 2024-03-29 DIAGNOSIS — I252 Old myocardial infarction: Secondary | ICD-10-CM | POA: Diagnosis not present

## 2024-03-29 DIAGNOSIS — I2510 Atherosclerotic heart disease of native coronary artery without angina pectoris: Secondary | ICD-10-CM | POA: Diagnosis not present

## 2024-03-29 DIAGNOSIS — E039 Hypothyroidism, unspecified: Secondary | ICD-10-CM | POA: Diagnosis not present

## 2024-04-02 ENCOUNTER — Inpatient Hospital Stay: Attending: Internal Medicine

## 2024-04-02 ENCOUNTER — Telehealth: Payer: Self-pay | Admitting: Physician Assistant

## 2024-04-02 NOTE — Telephone Encounter (Signed)
 Left the patient a voicemail with the rescheduled appointment details per provider requested.

## 2024-04-03 ENCOUNTER — Encounter: Payer: Self-pay | Admitting: Internal Medicine

## 2024-04-03 ENCOUNTER — Telehealth: Payer: Self-pay

## 2024-04-03 NOTE — Telephone Encounter (Signed)
 Spoke with patient's wife, Romero, this afternoon. She was calling to inquire about upcoming appointments. I asked if Less was available so I could speak with him directly. Spoke with Glendia, and he stated it was okay for me to speak with Romero regarding his appointments, saying, I can't remember what I done 10 minutes ago.  Informed Romero of the patient's upcoming appointments on 04/10/2024. She voiced understanding.  Romero also requested that their daughter, Rosina, be removed from the list of individuals authorized to receive information. I asked Gardiner if Romero was the only person to receive information and he said yes.

## 2024-04-04 ENCOUNTER — Inpatient Hospital Stay: Admitting: Internal Medicine

## 2024-04-04 ENCOUNTER — Inpatient Hospital Stay

## 2024-04-04 NOTE — Progress Notes (Signed)
  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 Unable to reach patient for today's appointment. 2 calls. No answer. Message was left w/ my extension [phone]. Call complete.  The patient was counseled that he  will be contacted by our brain and spine navigator to schedule surveillance imaging. The patient was encouraged to call if he  have not received a call to schedule imaging, or if he develops concerns or questions regarding radiation. The patient will also continue to follow up with Dr. Sherrod Sherrod in medical oncology.  This concludes the interaction.  Rosaline Minerva, LPN

## 2024-04-06 ENCOUNTER — Telehealth: Payer: Self-pay

## 2024-04-06 NOTE — Telephone Encounter (Signed)
 Verneita from [facility] called and left a voicemail requesting a return call.  Returned call and spoke with Verneita. She stated that she visited the patient's home to assess him for hospice services following a referral placed by Dr. Bertell. Verneita reported that the patient expressed interest in continuing treatment if possible.  Informed Verneita that the patient has an upcoming appointment with Dr. Sherrod on 04/10/24, as well as scheduled appointments for treatment. Advised her that I would notify her if the patient decides to proceed with hospice or continue with treatment. This information will be relayed to Dr. Sherrod for review.

## 2024-04-08 NOTE — Progress Notes (Deleted)
 Cardiology Office Note:  .   Date:  04/08/2024  ID:  [name], DOB December 14, 1960, MRN [id number] PCP: Bertell Satterfield, MD  [facility] Providers Cardiologist:  Annabella Scarce, MD Electrophysiologist:  Will Gladis Norton, MD {  History of Present Illness: .   [name] is a 63 y.o. male w/PMHx of  Smoker, Hep C, HLD, seizure d/o CAD (STEMI 2017 PCI to LAD) PVD (hx of R femoral tibial bypass followed by BKA) Lung Ca, COPD DVT (x2, 2009, 2019) Chronic CHF (mixed) ICD   Last saw EP/Dr. Norton back in 2021  Last saw cards during a hospital stay 2023, consulted/followed for large pericardial effusion > felt to be malignant > window  Last heme-onc visit 03/19/24: Stage IV, metastatic lung ca Had been lost to f/u about a year > admitted to [facility] on Feb 17, 2024, due to falls and gait imbalance. Imaging studies revealed widespread metastatic disease to the brain and increasing mass consolidation in the right lung  XRT brain completed Consider hospice care if treatment is ineffective or poorly tolerated. - Resume chemotherapy with carboplatin  and Alimta  every three weeks.   NO REMOTES since Sept 2024   Today's visit is scheduled as a post XRT device check ROS:   *** device only *** needs to re-establish w/cards *** [place]/APP *** volume *** meds  Device information MDT single chamber ICD implanted 03/02/2018  Studies Reviewed: SABRA    EKG done today and reviewed by myself:  ***  DEVICE interrogation done today and reviewed by myself *** Battery and lead measurements are good ***   02/20/24: TTE 1. Global hypokinesis with abnormal septal motion . Left ventricular  ejection fraction, by estimation, is 30 to 35%. The left ventricle has  moderately decreased function. The left ventricle demonstrates global  hypokinesis. There is mild left ventricular   hypertrophy.   2. Device lead in RV. Right ventricular systolic function is normal. The   right ventricular size is normal.   3. The inferior vena cava is normal in size with <50% respiratory  variability, suggesting right atrial pressure of 8 mmHg.    TTE 10/01/2021    1. Poor acoustic windows Definity  used. LVEF is depressed. There is akinesis of the mid/distal septum, distal anterior and apical walls. Overall LVEF is probably 30%.   2. Right ventricular systolic function is normal. The right ventricular size is normal.   3. Large pericardial effusion surrounds heart with consolidatation. Measures 17 mm in maximal dimension. More prominent than in echo done in June 2022     Effusion is most prominent at apex and posteriorly . There is mitral inflow variation. The IVC is dilated with blunted respiratory collapse      There is no definite RV collapse, RA collapse, however poor acoustic windows limit apical views. Concerning for hemodynamic compromise. Case reviewed with W Camnitz.   4. The mitral valve is grossly normal. No evidence of mitral valve regurgitation.   5. The aortic valve is normal in structure. Aortic valve regurgitation is not visualized.     LHC 05/26/16: A STENT XIENCE ALPINE RX [id number] drug eluting stent was successfully placed, and does not overlap previously placed stent. Prox LAD lesion, 100 %stenosed. Post intervention, there is a 0% residual stenosis. LV end diastolic pressure is mildly elevated. There is mild to moderate left ventricular systolic dysfunction.   Risk Assessment/Calculations:    Physical Exam:   VS:  There were no vitals taken for this  visit.   Wt Readings from Last 3 Encounters:  03/19/24 137 lb 1.6 oz (62.2 kg)  02/21/24 155 lb 10.3 oz (70.6 kg)  07/08/23 192 lb 0.3 oz (87.1 kg)    GEN: Well nourished, well developed in no acute distress NECK: No JVD; No carotid bruits CARDIAC: ***RRR, no murmurs, rubs, gallops RESPIRATORY:  *** CTA b/l without rales, wheezing or rhonchi  ABDOMEN: Soft, non-tender, non-distended EXTREMITIES: ***  No edema; No deformity   ICD site: *** is stable, no thinning, fluctuation, tethering  ASSESSMENT AND PLAN: .    ICD *** intact function *** no programming changes made  CAD *** Needs to re-establish with cardiology team  ICM Chronic CHF (mixed systolic/diastolic    *** Needs to re-establish with cards team   {Are you ordering a CV Procedure (e.g. stress test, cath, DCCV, TEE, etc)?   Press F2        :[id number]}     Dispo: ***  Signed, Charlies Macario Arthur, PA-C

## 2024-04-09 ENCOUNTER — Inpatient Hospital Stay

## 2024-04-09 ENCOUNTER — Ambulatory Visit
Admission: RE | Admit: 2024-04-09 | Discharge: 2024-04-09 | Disposition: A | Discharge status: Home / Self Care | Source: Ambulatory Visit | Attending: Internal Medicine | Admitting: Internal Medicine

## 2024-04-09 DIAGNOSIS — C7931 Secondary malignant neoplasm of brain: Secondary | ICD-10-CM | POA: Insufficient documentation

## 2024-04-09 DIAGNOSIS — C3491 Malignant neoplasm of unspecified part of right bronchus or lung: Secondary | ICD-10-CM | POA: Insufficient documentation

## 2024-04-09 DIAGNOSIS — Z510 Encounter for antineoplastic radiation therapy: Secondary | ICD-10-CM | POA: Insufficient documentation

## 2024-04-10 ENCOUNTER — Telehealth: Payer: Self-pay | Admitting: Medical Oncology

## 2024-04-10 ENCOUNTER — Ambulatory Visit: Attending: Physician Assistant | Admitting: Physician Assistant

## 2024-04-10 ENCOUNTER — Inpatient Hospital Stay: Admitting: Internal Medicine

## 2024-04-10 ENCOUNTER — Inpatient Hospital Stay

## 2024-04-10 NOTE — Telephone Encounter (Signed)
 Unable to get an answer on pt home and mobile number.

## 2024-04-11 ENCOUNTER — Encounter: Payer: Self-pay | Admitting: Physician Assistant

## 2024-04-15 NOTE — Progress Notes (Unsigned)
 [facility] OFFICE PROGRESS NOTE  Bertell Satterfield, MD [address] [place] [address]  DIAGNOSIS: Recurrent non-small cell lung cancer initially diagnosed as stage IIIA (T3, N2, M0) non-small cell lung cancer, adenosquamous carcinoma diagnosed in June 2019 and presented with large right upper lobe lung mass with questionable chest wall invasion as well as right hilar and mediastinal lymphadenopathy. He had disease progression in June 2021.   Biomarker Findings Tumor Mutational Burden - TMB-High (21 Muts/Mb) Microsatellite status - MS-Stable Genomic Findings For a complete list of the genes assayed, please refer to the Appendix. ATM E2183* CCND2 P281R KRAS G12V CHEK2 T367fs*15 TP53 E298* 7 Disease relevant genes with no reportable alterations: ALK, EGFR, BRAF, MET, RET, ERBB2, ROS1   PDL1 Expression: 99%.  PRIOR THERAPY:  1) Concurrent chemoradiation with chemotherapy consisting of weekly carboplatin  for an AUC of 2 and paclitaxel  45 mg/m2.  First dose given on 04/03/2018.  Status post 6 cycles.  Last dose was giving 05/15/2018 with partial response. 2) Consolidation treatment with immunotherapy with Imfinzi  (Durvalumab ) 10 mg/KG every 2 weeks.  First dose 06/20/2018.  Status post 26 cycles. 3) Systemic treatment with immunotherapy with Keytruda  200 mg IV every 3 weeks.  First dose February 27, 2020. Status post 12 cycles. Discontinued due to disease progression. 4)  Carboplatin  for an AUC of 5 and Alimta  500 mg per metered squared on days 1 IV every 3 weeks.  First dose expected on 03/03/2021. Status post 10 cycle. His dose of carboplatin  was reduced to an AUC of 4 and Alimta  reduced to 400 mg/m2 due to intolerance. Starting from cycle #6, we will started him on maintenance Alimta  400 mg/m2.  He was lost to follow up from 2023-2025 5) WBR under the care of Dr. Dewey completed on 03/07/24.   CURRENT THERAPY: Resuming treatment with carboplatin  for an AUC of 5*** and  alimta  400 mg/m2 on ***.   INTERVAL HISTORY: [name] 63 y.o. male returns to the clinic today for a follow up visit. In summery, the patient was diagnosed with lung cancer in 2019. He was found to have disease progression in 2021. He had been lost to follow up from 2023-2025. He has some memory impairments at baseline unrelated to his recently diagnosed metastatic disease to the brain. He recently was hospitalized and found to have progression as well as metastatic disease to the brain in June 2025. He completed WBR on 03/07/24.   Dr. Sherrod saw the patient on 03/19/24 to re-establish care on an outpatient basis. Dr. Sherrod had placed a careplan to resume treatment on 03/26/34. However, the patient did not show up. We have received messages from the patient regarding hospice vs treatment. The patient called recently stating hospice came to the house but he was interested in treatment.   Today, the patient states ***.   He denies fevers, chills, night sweats, or unexplained weight loss. He denies nausea, vomiting, or constipation. He did present to the [facility] on 03/25/24 for diarrhea. His breathing is ***. He ***shortness of breath, cough, or hemoptysis. He denies chest pain. He denies headaches or vision changes. He denies rashes or skin changes. He is here for evaluation and repeat blood work before undergoing cycle #1.        Mixed messages about hospice care and wanting treatment.   MEDICAL HISTORY: Past Medical History:  Diagnosis Date   Acute hepatitis C virus infection 06/19/2007   Qualifier: Diagnosis of  By: Leron CMA, Tiffany  Acute ST elevation myocardial infarction (STEMI) involving left anterior descending (LAD) coronary artery (HCC) 05/26/2016   Acute ST elevation myocardial infarction (STEMI) of lateral wall (HCC) 05/26/2016   AICD (automatic cardioverter/defibrillator) present 03/02/2018   Anxiety    Asthma    Atherosclerosis of native arteries of the extremities with  intermittent claudication 12/16/2011   Chronic hepatitis C without hepatic coma (HCC) 05/03/2014   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/2018   FEV1 57%   Depression    DVT (deep venous thrombosis) (HCC) 2009; 2019   RLE; LLE   Encounter for antineoplastic chemotherapy 03/22/2018   Hepatitis C    tx'd in 2015   High cholesterol    Ischemic cardiomyopathy 03/02/2018   Non-small cell carcinoma of right lung, stage 3 (HCC) 03/22/2018   PAD (peripheral artery disease) (HCC) 01/25/2013   Peripheral vascular disease, unspecified 07/20/2012   Port-A-Cath in place 04/24/2018   ST elevation myocardial infarction involving left anterior descending (LAD) coronary artery (HCC)    STEMI (ST elevation myocardial infarction) (HCC) 05/26/2016   Tobacco abuse 01/28/2016   Tubular adenoma of colon 07/11/2014   Urinary dribbling     ALLERGIES:  is allergic to lyrica [pregabalin] and neurontin [gabapentin].  MEDICATIONS:  Current Outpatient Medications  Medication Sig Dispense Refill   albuterol  (VENTOLIN  HFA) 108 (90 Base) MCG/ACT inhaler Inhale 1-2 puffs into the lungs every 4 (four) hours as needed for wheezing or shortness of breath.     alprazolam  (XANAX ) 2 MG tablet Take 2 mg by mouth 4 (four) times daily as needed for anxiety.     amitriptyline  (ELAVIL ) 100 MG tablet Take 100 mg by mouth at bedtime.      aspirin  81 MG EC tablet Take 81 mg by mouth daily.     atorvastatin  (LIPITOR ) 80 MG tablet Take 1 tablet (80 mg total) by mouth daily. 30 tablet 2   citalopram  (CELEXA ) 40 MG tablet Take 40 mg by mouth daily.  11   dexamethasone  (DECADRON ) 4 MG tablet Take 1 tablet (4 mg total) by mouth 3 (three) times daily. 90 tablet 1   dexamethasone  (DECADRON ) 4 MG tablet Take 1 tab by mouth 2 times daily starting day before pemetrexed . Then take 2 tabs daily x 3 days starting day after chemo. Take with food. 30 tablet 1   feeding supplement (ENSURE ENLIVE / ENSURE PLUS) LIQD Take 237 mLs by mouth 2  (two) times daily between meals. 237 mL 12   folic acid  (FOLVITE ) 1 MG tablet Take 1 tablet (1 mg total) by mouth daily. Start 7 days before pemetrexed  chemotherapy. Continue until 21 days after pemetrexed  completed. 100 tablet 3   HYDROcodone -acetaminophen  (NORCO) 10-325 MG tablet Take 1 tablet by mouth every 4 (four) hours as needed for moderate pain. 20 tablet 0   leptospermum manuka honey (MEDIHONEY) PSTE paste Apply 1 Application topically daily. Cleanse sacral wound with NS, apply Medihoney to wound bed daily, cover with dry gauze and silicone foam Interchangeable with TheraHoney. Apply thin layer (3 mm) to wound. 44 mL 0   levETIRAcetam  (KEPPRA ) 500 MG tablet Take 1 tablet (500 mg total) by mouth 2 (two) times daily. 60 tablet 2   levothyroxine  (SYNTHROID ) 175 MCG tablet Take 175 mcg by mouth daily before breakfast.     lidocaine -prilocaine  (EMLA ) cream Apply 1 application topically as needed. (Patient taking differently: Apply 1 application  topically daily as needed (port access).) 30 g 0   lidocaine -prilocaine  (EMLA ) cream Apply to affected  area once 30 g 3   liver oil-zinc  oxide (DESITIN) 40 % ointment Apply topically 2 (two) times daily. Apply to a thin layer of Desitin to intact skin of buttocks 2 times daily and as needed for soiling 397 g 0   midodrine  (PROAMATINE ) 10 MG tablet Take 10 mg by mouth 3 (three) times daily.     nicotine  (NICODERM CQ  - DOSED IN MG/24 HOURS) 14 mg/24hr patch Place 1 patch (14 mg total) onto the skin daily. 28 patch 0   nitroGLYCERIN  (NITROSTAT ) 0.4 MG SL tablet Place 1 tablet (0.4 mg total) under the tongue every 5 (five) minutes x 3 doses as needed for chest pain. 25 tablet 2   ondansetron  (ZOFRAN ) 8 MG tablet Take 1 tablet (8 mg total) by mouth every 8 (eight) hours as needed for nausea or vomiting. Start on the third day after chemotherapy. 30 tablet 1   polyethylene glycol (MIRALAX  / GLYCOLAX ) 17 g packet Take 17 g by mouth daily. 14 each 0   potassium  chloride SA (KLOR-CON  M) 20 MEQ tablet Take 1 tablet (20 mEq total) by mouth 2 (two) times daily. 10 tablet 0   senna-docusate (SENOKOT-S) 8.6-50 MG tablet Take 2 tablets by mouth 2 (two) times daily. 120 tablet 0   No current facility-administered medications for this visit.    SURGICAL HISTORY:  Past Surgical History:  Procedure Laterality Date   AORTOGRAM  08/08/2009   for LLE claudication     By Dr. Harvey   BELOW KNEE LEG AMPUTATION Right 04/25/2008   BIOPSY N/A 05/30/2014   Procedure: BIOPSY;  Surgeon: Lamar CHRISTELLA Hollingshead, MD;  Location: [facility];  Service: Endoscopy;  Laterality: N/A;   BLADDER TUMOR EXCISION  8/812   CARDIAC CATHETERIZATION N/A 05/26/2016   Procedure: Left Heart Cath and Coronary Angiography;  Surgeon: Debby DELENA Sor, MD;  Location[facility] INVASIVE CV LAB;  Service: Cardiovascular;  Laterality: N/A;   CARDIAC CATHETERIZATION N/A 05/26/2016   Procedure: Coronary Stent Intervention;  Surgeon: Debby DELENA Sor, MD;  Location: [facility] INVASIVE CV LAB;  Service: Cardiovascular;  Laterality: N/A;   COLONOSCOPY WITH PROPOFOL  N/A 05/30/2014   MFM:Dpwhoz colonic polyp-likely source of hematochezia-removed as described above   ESOPHAGOGASTRODUODENOSCOPY (EGD) WITH PROPOFOL  N/A 05/30/2014   MFM:Jwumjo and bulbar erosions s/p gastric biopsy. No evidence of portal gastropathy on today's examination.   FEMORAL-TIBIAL BYPASS GRAFT  2009   Right side using non-reversed GSV   By Dr. Harvey SPELLS BYPASS GRAFT  11/24/2007   Right femoral to anterior tibial BPG   by Dr. Harvey   FINGER SURGERY Left    straightened my pinky   HAND TENDON SURGERY Left 2013   Left 5th finger   HERNIA REPAIR     stomach   ICD IMPLANT N/A 03/02/2018   Procedure: ICD IMPLANT;  Surgeon: Inocencio Soyla Lunger, MD;  Location: [facility] INVASIVE CV LAB;  Service: Cardiovascular;  Laterality: N/A;   INCISIONAL HERNIA REPAIR N/A 12/25/2014   Procedure: HERNIA REPAIR INCISIONAL WITH MESH;  Surgeon: Oneil Budge Md, MD;   Location: [facility];  Service: General;  Laterality: N/A;   INSERTION OF MESH N/A 12/25/2014   Procedure: INSERTION OF MESH;  Surgeon: Oneil Budge Md, MD;  Location: [facility];  Service: General;  Laterality: N/A;   IR IMAGING GUIDED PORT INSERTION  04/11/2018   LAPAROSCOPIC CHOLECYSTECTOMY     LOWER EXTREMITY ANGIOGRAM Left 12/30/2015   Procedure: Lower Extremity Angiogram;  Surgeon: Carlin FORBES Harvey, MD;  Location:  [facility]ASIVE CV LAB;  Service: Cardiovascular;  Laterality: Left;   PERIPHERAL VASCULAR CATHETERIZATION N/A 12/30/2015   Procedure: Abdominal Aortogram;  Surgeon: Carlin FORBES Haddock, MD;  Location: [facility] INVASIVE CV LAB;  Service: Cardiovascular;  Laterality: N/A;   POLYPECTOMY  05/30/2014   Procedure: POLYPECTOMY;  Surgeon: Lamar CHRISTELLA Hollingshead, MD;  Location: [facility];  Service: Endoscopy;;   TONSILLECTOMY AND ADENOIDECTOMY  ~ 1970   TYMPANOSTOMY TUBE PLACEMENT Bilateral ~ 1970   VIDEO ASSISTED THORACOSCOPY Left 10/04/2021   Procedure: PERICARDIAL WINDOW VIDEO ASSISTED THORACOSCOPY APPROACH;  Surgeon: Shyrl Linnie KIDD, MD;  Location: [facility];  Service: Thoracic;  Laterality: Left;  pericardial window.  full lateral    REVIEW OF SYSTEMS:   Review of Systems  Constitutional: Negative for appetite change, chills, fatigue, fever and unexpected weight change.  HENT:   Negative for mouth sores, nosebleeds, sore throat and trouble swallowing.   Eyes: Negative for eye problems and icterus.  Respiratory: Negative for cough, hemoptysis, shortness of breath and wheezing.   Cardiovascular: Negative for chest pain and leg swelling.  Gastrointestinal: Negative for abdominal pain, constipation, diarrhea, nausea and vomiting.  Genitourinary: Negative for bladder incontinence, difficulty urinating, dysuria, frequency and hematuria.   Musculoskeletal: Negative for back pain, gait problem, neck pain and neck stiffness.  Skin: Negative for itching and rash.  Neurological: Negative for dizziness, extremity weakness, gait  problem, headaches, light-headedness and seizures.  Hematological: Negative for adenopathy. Does not bruise/bleed easily.  Psychiatric/Behavioral: Negative for confusion, depression and sleep disturbance. The patient is not nervous/anxious.     PHYSICAL EXAMINATION:  There were no vitals taken for this visit.  ECOG PERFORMANCE STATUS: {CHL ONC ECOG [id number]  Physical Exam  Constitutional: Oriented to person, place, and time and well-developed, well-nourished, and in no distress. No distress.  HENT:  Head: Normocephalic and atraumatic.  Mouth/Throat: Oropharynx is clear and moist. No oropharyngeal exudate.  Eyes: Conjunctivae are normal. Right eye exhibits no discharge. Left eye exhibits no discharge. No scleral icterus.  Neck: Normal range of motion. Neck supple.  Cardiovascular: Normal rate, regular rhythm, normal heart sounds and intact distal pulses.   Pulmonary/Chest: Effort normal and breath sounds normal. No respiratory distress. No wheezes. No rales.  Abdominal: Soft. Bowel sounds are normal. Exhibits no distension and no mass. There is no tenderness.  Musculoskeletal: Normal range of motion. Exhibits no edema.  Lymphadenopathy:    No cervical adenopathy.  Neurological: Alert and oriented to person, place, and time. Exhibits normal muscle tone. Gait normal. Coordination normal.  Skin: Skin is warm and dry. No rash noted. Not diaphoretic. No erythema. No pallor.  Psychiatric: Mood, memory and judgment normal.  Vitals reviewed.  LABORATORY DATA: Lab Results  Component Value Date   WBC 9.6 03/25/2024   HGB 13.7 03/25/2024   HCT 43.1 03/25/2024   MCV 88.1 03/25/2024   PLT 214 03/25/2024      Chemistry      Component Value Date/Time   NA 135 03/25/2024 1019   NA 139 02/27/2018 1457   K 2.5 (LL) 03/25/2024 1019   CL 97 (L) 03/25/2024 1019   CO2 28 03/25/2024 1019   BUN 9 03/25/2024 1019   BUN 7 02/27/2018 1457   CREATININE 0.63 03/25/2024 1019   CREATININE 0.70  03/19/2024 0916   CREATININE 1.13 06/28/2016 1453      Component Value Date/Time   CALCIUM  8.1 (L) 03/25/2024 1019   ALKPHOS 90 03/25/2024 1019   AST 19 03/25/2024 1019   AST 23  03/19/2024 0916   ALT 23 03/25/2024 1019   ALT 34 03/19/2024 0916   BILITOT 0.8 03/25/2024 1019   BILITOT 0.7 03/19/2024 0916       RADIOGRAPHIC STUDIES:  No results found.   ASSESSMENT/PLAN:  This is a very pleasant 63 year old Caucasian male with recurrent/metastatic lung cancer initially diagnosed as stage IIIa non-small cell lung cancer, adenosquamous carcinoma.  He presented with a large right upper lobe lung mass with questionable chest wall invasion as well as a right hilar and mediastinal lymphadenopathy.  He was diagnosed in June 2019. His PDL1 expression is 99%   He completed 6 cycles of concurrent chemoradiation with carboplatin  and paclitaxel .  He had a partial response to treatment.   The patient underwent consolidation immunotherapy with Imfinzi  10 mg/kg IV every 2 weeks.  He is status post 26 cycles.  He completed this on 07/04/2019   He was on observation until he showed evidence of local recurrence.  He then underwent immunotherapy with Keytruda  200 mg IV every 3 weeks. He is status post 9 cycles. He tolerated this well without any concerning complains. The patient has missed several appointments since November 2021. He only returned to the clinic once for treatment in that interval. Therefore, on his follow up imaging studies in March 2022, he had evidence of disease progession.  He restarted treatment at that time and continued to tolerate it well. Unfortunately, he had evidence for disease progression in May 2022 and this was discontinued   He was then on The patient is currently undergoing treatment with systemic chemotherapy with carboplatin  for an AUC of 5 and Alimta  500 mg/m IV every 3 weeks. He is status post 7 cycles.  His dose of carboplatin  was reduced to AUC of 4 and his Alimta  was  reduced to 400 mg per metered squared due to intolerance and myelosuppression.  He had been having a challenging time with pancytopenia. Starting from cycle #6, he started maintenance single agent Alimta  400 mg/m2.  However, he was lost to follow up from 2023-2025  Recently in June 2025, he was found to have disease progression and metastatic disease to the brain. He completed WBR on 03/07/24.   The patient saw Dr. Sherrod on 03/19/24 and discssed hsopice vs systemic treatment. The patient did not show up for his appointment on 03/26/24. He has memory impairment at baseline unrelated to the metastatic disease to the brain.   The patient was recently evaluated by hospice and ***.   The patient was seen with Dr. Sherrod today. Dr. Sherrod had a lengthy discussion with the patient. The patient opted to ***.   I will arrange for ***.   F/U  Doses? Hx pancytopenia. Weekly labs.   BP  The patient was advised to call immediately if he has any concerning symptoms in the interval. The patient voices understanding of current disease status and treatment options and is in agreement with the current care plan. All questions were answered. The patient knows to call the clinic with any problems, questions or concerns. We can certainly see the patient much sooner if necessary         No orders of the defined types were placed in this encounter.    I spent {CHL ONC TIME VISIT - DTPQU:[id number]} counseling the patient face to face. The total time spent in the appointment was {CHL ONC TIME VISIT - DTPQU:[id number]}.  Emlyn Maves L Posie Lillibridge, PA-C 04/15/24

## 2024-04-16 ENCOUNTER — Telehealth: Payer: Self-pay | Admitting: Medical Oncology

## 2024-04-16 MED FILL — Fosaprepitant Dimeglumine For IV Infusion 150 MG (Base Eq): INTRAVENOUS | Qty: 5 | Status: AC

## 2024-04-16 NOTE — Telephone Encounter (Signed)
 Rosina (Aayush's step- daughter) called and said Devlin missed  a lot of his appts. She is concerned  I told her that he is aware of his appointments.  The  last appt Renly attended was 06/30 /25.

## 2024-04-17 ENCOUNTER — Inpatient Hospital Stay: Admitting: Physician Assistant

## 2024-04-17 ENCOUNTER — Inpatient Hospital Stay

## 2024-04-17 ENCOUNTER — Telehealth: Payer: Self-pay | Admitting: Internal Medicine

## 2024-04-17 ENCOUNTER — Telehealth: Payer: Self-pay

## 2024-04-17 DIAGNOSIS — C3491 Malignant neoplasm of unspecified part of right bronchus or lung: Secondary | ICD-10-CM

## 2024-04-17 NOTE — Telephone Encounter (Signed)
 Called the patient and received the daughter. The daughter stated that she is unable to schedule for her father and needed to contact Romero at [phone].

## 2024-04-17 NOTE — Telephone Encounter (Signed)
 Spoke with patient's wife regarding missed appointments today. She stated she was not aware of the appointments. Spoke with the patient, and he stated, "I don't know if I want to continue the treatments, can I call you back and let you know?" Informed him that was okay. Reviewed upcoming appointments with the wife: 7/29 at 1015 with infusion and visit with [facility] at 1045, and infusion appointment on 8/1 at 0830. Wife stated she wrote the appointments down on the calendar. Informed her that if I do not hear back from the patient by Friday, I will give them a call. She voiced understanding.

## 2024-04-23 ENCOUNTER — Inpatient Hospital Stay

## 2024-04-23 ENCOUNTER — Telehealth: Payer: Self-pay

## 2024-04-23 ENCOUNTER — Encounter: Payer: Self-pay | Admitting: Internal Medicine

## 2024-04-23 NOTE — Telephone Encounter (Signed)
 Received message that patient's daughter, Rosina, is trying to return a hospital bed and is unsure who it should be returned to. She reported calling [employer] and the company the bed came from, but neither were able to provide information.   Left voicemail for Rosina to see if she has resolved the hospital bed issue and to call us  back if further assistance is needed.

## 2024-04-23 NOTE — Telephone Encounter (Signed)
 Spoke with patient's wife, [name], today. She stated, Hospice is here, no more appointments at the cancer center. I asked to speak with the patient, [name], and confirmed with him whether he planned to continue treatments at the cancer center. He stated, No more treatments. Informed him that I would cancel all upcoming appointments at the cancer center. He voiced understanding.

## 2024-04-23 NOTE — Telephone Encounter (Signed)
 Spoke with patient's daughter, Rosina, regarding the hospital bed. She stated the bed was delivered prior to the patient's discharge from the hospital. Reviewed the discharge summary note from 02/27/24 and confirmed that DME was ordered through [employer]. Informed Rosina to contact [employer] regarding the bed. She voiced thanks and understanding.

## 2024-04-24 ENCOUNTER — Inpatient Hospital Stay

## 2024-04-24 ENCOUNTER — Inpatient Hospital Stay: Admitting: Internal Medicine

## 2024-04-27 ENCOUNTER — Ambulatory Visit

## 2024-04-30 ENCOUNTER — Inpatient Hospital Stay

## 2024-05-08 ENCOUNTER — Inpatient Hospital Stay

## 2024-05-08 ENCOUNTER — Inpatient Hospital Stay: Admitting: Internal Medicine

## 2024-05-14 ENCOUNTER — Inpatient Hospital Stay

## 2024-05-21 ENCOUNTER — Inpatient Hospital Stay

## 2024-05-28 DEATH — deceased

## 2024-05-29 ENCOUNTER — Other Ambulatory Visit

## 2024-05-29 ENCOUNTER — Ambulatory Visit

## 2024-05-29 ENCOUNTER — Ambulatory Visit: Admitting: Physician Assistant

## 2024-06-14 ENCOUNTER — Ambulatory Visit: Payer: Medicare Other

## 2024-09-13 ENCOUNTER — Ambulatory Visit: Payer: Medicare Other

## 2024-12-13 ENCOUNTER — Ambulatory Visit: Payer: Medicare Other

## 2025-03-14 ENCOUNTER — Ambulatory Visit: Payer: Medicare Other

## 2025-06-13 ENCOUNTER — Ambulatory Visit: Payer: Medicare Other

## 2025-09-12 ENCOUNTER — Ambulatory Visit: Payer: Medicare Other

## 2025-12-12 ENCOUNTER — Ambulatory Visit: Payer: Medicare Other
